# Patient Record
Sex: Female | Born: 1940 | Race: Black or African American | Hispanic: No | Marital: Married | Smoking: Never smoker
Health system: Southern US, Community
[De-identification: ages and names within clinical notes are randomized; demographics above are authoritative.]

## PROBLEM LIST (undated history)

## (undated) ENCOUNTER — Emergency Department (HOSPITAL_COMMUNITY): Admission: EM | Disposition: A | Discharge status: Home / Self Care | Payer: Self-pay

## (undated) ENCOUNTER — Emergency Department (HOSPITAL_COMMUNITY): Discharge status: Absent without leave | Payer: Self-pay

## (undated) DIAGNOSIS — J42 Unspecified chronic bronchitis: Secondary | ICD-10-CM

## (undated) DIAGNOSIS — D509 Iron deficiency anemia, unspecified: Secondary | ICD-10-CM

## (undated) DIAGNOSIS — R079 Chest pain, unspecified: Secondary | ICD-10-CM

## (undated) DIAGNOSIS — F419 Anxiety disorder, unspecified: Secondary | ICD-10-CM

## (undated) DIAGNOSIS — G8929 Other chronic pain: Secondary | ICD-10-CM

## (undated) DIAGNOSIS — I82409 Acute embolism and thrombosis of unspecified deep veins of unspecified lower extremity: Secondary | ICD-10-CM

## (undated) DIAGNOSIS — K219 Gastro-esophageal reflux disease without esophagitis: Secondary | ICD-10-CM

## (undated) DIAGNOSIS — F209 Schizophrenia, unspecified: Secondary | ICD-10-CM

## (undated) DIAGNOSIS — L409 Psoriasis, unspecified: Secondary | ICD-10-CM

## (undated) DIAGNOSIS — R519 Headache, unspecified: Secondary | ICD-10-CM

## (undated) DIAGNOSIS — I8393 Asymptomatic varicose veins of bilateral lower extremities: Secondary | ICD-10-CM

## (undated) DIAGNOSIS — T4145XA Adverse effect of unspecified anesthetic, initial encounter: Secondary | ICD-10-CM

## (undated) DIAGNOSIS — E785 Hyperlipidemia, unspecified: Secondary | ICD-10-CM

## (undated) DIAGNOSIS — R011 Cardiac murmur, unspecified: Secondary | ICD-10-CM

## (undated) DIAGNOSIS — I2699 Other pulmonary embolism without acute cor pulmonale: Secondary | ICD-10-CM

## (undated) DIAGNOSIS — I10 Essential (primary) hypertension: Secondary | ICD-10-CM

## (undated) DIAGNOSIS — F329 Major depressive disorder, single episode, unspecified: Secondary | ICD-10-CM

## (undated) DIAGNOSIS — J189 Pneumonia, unspecified organism: Secondary | ICD-10-CM

## (undated) DIAGNOSIS — G4730 Sleep apnea, unspecified: Secondary | ICD-10-CM

## (undated) DIAGNOSIS — R51 Headache: Secondary | ICD-10-CM

## (undated) DIAGNOSIS — M1990 Unspecified osteoarthritis, unspecified site: Secondary | ICD-10-CM

## (undated) DIAGNOSIS — F32A Depression, unspecified: Secondary | ICD-10-CM

## (undated) DIAGNOSIS — R0602 Shortness of breath: Secondary | ICD-10-CM

## (undated) DIAGNOSIS — T8859XA Other complications of anesthesia, initial encounter: Secondary | ICD-10-CM

## (undated) HISTORY — PX: CARPAL TUNNEL RELEASE: SHX101

## (undated) HISTORY — DX: Acute embolism and thrombosis of unspecified deep veins of unspecified lower extremity: I82.409

## (undated) HISTORY — DX: Other pulmonary embolism without acute cor pulmonale: I26.99

## (undated) HISTORY — DX: Depression, unspecified: F32.A

## (undated) HISTORY — PX: VEIN SURGERY: SHX48

## (undated) HISTORY — DX: Sleep apnea, unspecified: G47.30

## (undated) HISTORY — DX: Anxiety disorder, unspecified: F41.9

## (undated) HISTORY — PX: SPINAL FUSION: SHX223

## (undated) HISTORY — PX: JOINT REPLACEMENT: SHX530

## (undated) HISTORY — DX: Schizophrenia, unspecified: F20.9

## (undated) HISTORY — DX: Hyperlipidemia, unspecified: E78.5

## (undated) HISTORY — DX: Unspecified osteoarthritis, unspecified site: M19.90

## (undated) HISTORY — DX: Gastro-esophageal reflux disease without esophagitis: K21.9

## (undated) HISTORY — DX: Major depressive disorder, single episode, unspecified: F32.9

## (undated) HISTORY — DX: Iron deficiency anemia, unspecified: D50.9

## (undated) HISTORY — DX: Essential (primary) hypertension: I10

## (undated) HISTORY — DX: Other chronic pain: G89.29

---

## 1969-05-25 HISTORY — PX: DILATION AND CURETTAGE OF UTERUS: SHX78

## 1971-09-25 HISTORY — PX: TUBAL LIGATION: SHX77

## 2001-04-20 ENCOUNTER — Emergency Department (HOSPITAL_COMMUNITY): Admission: EM | Admit: 2001-04-20 | Discharge: 2001-04-20 | Payer: Self-pay | Admitting: Emergency Medicine

## 2001-04-28 ENCOUNTER — Encounter: Payer: Self-pay | Admitting: Internal Medicine

## 2001-04-28 ENCOUNTER — Encounter: Admission: RE | Admit: 2001-04-28 | Discharge: 2001-04-28 | Payer: Self-pay | Admitting: Internal Medicine

## 2001-06-14 ENCOUNTER — Emergency Department (HOSPITAL_COMMUNITY): Admission: EM | Admit: 2001-06-14 | Discharge: 2001-06-15 | Payer: Self-pay | Admitting: *Deleted

## 2001-07-15 ENCOUNTER — Other Ambulatory Visit: Admission: RE | Admit: 2001-07-15 | Discharge: 2001-07-15 | Payer: Self-pay | Admitting: Internal Medicine

## 2001-07-17 ENCOUNTER — Emergency Department (HOSPITAL_COMMUNITY): Admission: EM | Admit: 2001-07-17 | Discharge: 2001-07-17 | Payer: Self-pay | Admitting: Emergency Medicine

## 2001-09-19 ENCOUNTER — Encounter: Payer: Self-pay | Admitting: Neurosurgery

## 2001-09-19 ENCOUNTER — Encounter: Admission: RE | Admit: 2001-09-19 | Discharge: 2001-09-19 | Payer: Self-pay | Admitting: Neurosurgery

## 2001-09-20 ENCOUNTER — Emergency Department (HOSPITAL_COMMUNITY): Admission: EM | Admit: 2001-09-20 | Discharge: 2001-09-20 | Payer: Self-pay | Admitting: Emergency Medicine

## 2001-10-06 ENCOUNTER — Encounter: Payer: Self-pay | Admitting: Neurosurgery

## 2001-10-09 ENCOUNTER — Encounter: Payer: Self-pay | Admitting: Neurosurgery

## 2001-10-09 ENCOUNTER — Inpatient Hospital Stay (HOSPITAL_COMMUNITY): Admission: RE | Admit: 2001-10-09 | Discharge: 2001-10-12 | Payer: Self-pay | Admitting: Neurosurgery

## 2001-10-21 ENCOUNTER — Emergency Department (HOSPITAL_COMMUNITY): Admission: EM | Admit: 2001-10-21 | Discharge: 2001-10-21 | Payer: Self-pay | Admitting: *Deleted

## 2001-10-22 ENCOUNTER — Encounter: Payer: Self-pay | Admitting: Emergency Medicine

## 2001-11-18 ENCOUNTER — Encounter: Payer: Self-pay | Admitting: Neurosurgery

## 2001-11-18 ENCOUNTER — Ambulatory Visit (HOSPITAL_COMMUNITY): Admission: RE | Admit: 2001-11-18 | Discharge: 2001-11-18 | Payer: Self-pay | Admitting: Neurosurgery

## 2001-12-25 ENCOUNTER — Emergency Department (HOSPITAL_COMMUNITY): Admission: EM | Admit: 2001-12-25 | Discharge: 2001-12-25 | Payer: Self-pay | Admitting: Emergency Medicine

## 2001-12-25 ENCOUNTER — Encounter: Payer: Self-pay | Admitting: Emergency Medicine

## 2002-02-17 ENCOUNTER — Encounter: Admission: RE | Admit: 2002-02-17 | Discharge: 2002-02-17 | Payer: Self-pay | Admitting: Neurosurgery

## 2002-02-17 ENCOUNTER — Encounter: Payer: Self-pay | Admitting: Neurosurgery

## 2002-03-06 ENCOUNTER — Emergency Department (HOSPITAL_COMMUNITY): Admission: EM | Admit: 2002-03-06 | Discharge: 2002-03-06 | Payer: Self-pay

## 2002-03-24 ENCOUNTER — Encounter: Admission: RE | Admit: 2002-03-24 | Discharge: 2002-03-24 | Payer: Self-pay | Admitting: Neurosurgery

## 2002-03-24 ENCOUNTER — Encounter: Payer: Self-pay | Admitting: Neurosurgery

## 2002-03-25 ENCOUNTER — Encounter: Admission: RE | Admit: 2002-03-25 | Discharge: 2002-06-23 | Payer: Self-pay

## 2002-05-07 ENCOUNTER — Emergency Department (HOSPITAL_COMMUNITY): Admission: EM | Admit: 2002-05-07 | Discharge: 2002-05-07 | Payer: Self-pay | Admitting: Emergency Medicine

## 2002-05-13 ENCOUNTER — Emergency Department (HOSPITAL_COMMUNITY): Admission: EM | Admit: 2002-05-13 | Discharge: 2002-05-13 | Payer: Self-pay | Admitting: Emergency Medicine

## 2002-06-08 ENCOUNTER — Emergency Department (HOSPITAL_COMMUNITY): Admission: EM | Admit: 2002-06-08 | Discharge: 2002-06-08 | Payer: Self-pay | Admitting: Emergency Medicine

## 2002-06-18 ENCOUNTER — Encounter: Admission: RE | Admit: 2002-06-18 | Discharge: 2002-09-16 | Payer: Self-pay

## 2002-06-24 ENCOUNTER — Encounter: Admission: RE | Admit: 2002-06-24 | Discharge: 2002-07-07 | Payer: Self-pay

## 2002-07-22 ENCOUNTER — Encounter: Admission: RE | Admit: 2002-07-22 | Discharge: 2002-10-20 | Payer: Self-pay

## 2002-08-30 ENCOUNTER — Emergency Department (HOSPITAL_COMMUNITY): Admission: EM | Admit: 2002-08-30 | Discharge: 2002-08-30 | Payer: Self-pay | Admitting: Emergency Medicine

## 2002-10-17 ENCOUNTER — Emergency Department (HOSPITAL_COMMUNITY): Admission: EM | Admit: 2002-10-17 | Discharge: 2002-10-17 | Payer: Self-pay | Admitting: *Deleted

## 2002-10-30 ENCOUNTER — Emergency Department (HOSPITAL_COMMUNITY): Admission: EM | Admit: 2002-10-30 | Discharge: 2002-10-30 | Payer: Self-pay | Admitting: Emergency Medicine

## 2002-11-02 ENCOUNTER — Encounter: Admission: RE | Admit: 2002-11-02 | Discharge: 2003-01-31 | Payer: Self-pay

## 2002-11-06 ENCOUNTER — Encounter: Payer: Self-pay | Admitting: Emergency Medicine

## 2002-11-06 ENCOUNTER — Emergency Department (HOSPITAL_COMMUNITY): Admission: EM | Admit: 2002-11-06 | Discharge: 2002-11-06 | Payer: Self-pay | Admitting: Emergency Medicine

## 2002-11-21 ENCOUNTER — Emergency Department (HOSPITAL_COMMUNITY): Admission: EM | Admit: 2002-11-21 | Discharge: 2002-11-21 | Payer: Self-pay | Admitting: Emergency Medicine

## 2002-11-24 ENCOUNTER — Emergency Department (HOSPITAL_COMMUNITY): Admission: EM | Admit: 2002-11-24 | Discharge: 2002-11-24 | Payer: Self-pay | Admitting: Emergency Medicine

## 2002-12-17 ENCOUNTER — Emergency Department (HOSPITAL_COMMUNITY): Admission: EM | Admit: 2002-12-17 | Discharge: 2002-12-17 | Payer: Self-pay | Admitting: Emergency Medicine

## 2002-12-24 ENCOUNTER — Ambulatory Visit (HOSPITAL_BASED_OUTPATIENT_CLINIC_OR_DEPARTMENT_OTHER): Admission: RE | Admit: 2002-12-24 | Discharge: 2002-12-24 | Payer: Self-pay | Admitting: Neurology

## 2003-01-13 ENCOUNTER — Emergency Department (HOSPITAL_COMMUNITY): Admission: EM | Admit: 2003-01-13 | Discharge: 2003-01-13 | Payer: Self-pay | Admitting: Emergency Medicine

## 2003-02-03 ENCOUNTER — Emergency Department (HOSPITAL_COMMUNITY): Admission: EM | Admit: 2003-02-03 | Discharge: 2003-02-03 | Payer: Self-pay | Admitting: Emergency Medicine

## 2003-02-15 ENCOUNTER — Emergency Department (HOSPITAL_COMMUNITY): Admission: EM | Admit: 2003-02-15 | Discharge: 2003-02-16 | Payer: Self-pay | Admitting: Emergency Medicine

## 2003-03-04 ENCOUNTER — Encounter: Admission: RE | Admit: 2003-03-04 | Discharge: 2003-03-04 | Payer: Self-pay | Admitting: Obstetrics and Gynecology

## 2003-03-10 ENCOUNTER — Ambulatory Visit (HOSPITAL_COMMUNITY): Admission: RE | Admit: 2003-03-10 | Discharge: 2003-03-10 | Payer: Self-pay | Admitting: *Deleted

## 2003-03-14 ENCOUNTER — Emergency Department (HOSPITAL_COMMUNITY): Admission: EM | Admit: 2003-03-14 | Discharge: 2003-03-14 | Payer: Self-pay | Admitting: Emergency Medicine

## 2003-03-24 ENCOUNTER — Encounter: Payer: Self-pay | Admitting: Orthopedic Surgery

## 2003-03-27 ENCOUNTER — Emergency Department (HOSPITAL_COMMUNITY): Admission: EM | Admit: 2003-03-27 | Discharge: 2003-03-27 | Payer: Self-pay | Admitting: Emergency Medicine

## 2003-03-30 ENCOUNTER — Ambulatory Visit (HOSPITAL_COMMUNITY): Admission: RE | Admit: 2003-03-30 | Discharge: 2003-03-30 | Payer: Self-pay | Admitting: Orthopedic Surgery

## 2003-04-24 ENCOUNTER — Emergency Department (HOSPITAL_COMMUNITY): Admission: EM | Admit: 2003-04-24 | Discharge: 2003-04-24 | Payer: Self-pay | Admitting: Emergency Medicine

## 2003-05-22 ENCOUNTER — Emergency Department (HOSPITAL_COMMUNITY): Admission: EM | Admit: 2003-05-22 | Discharge: 2003-05-22 | Payer: Self-pay | Admitting: Emergency Medicine

## 2003-06-08 ENCOUNTER — Emergency Department (HOSPITAL_COMMUNITY): Admission: EM | Admit: 2003-06-08 | Discharge: 2003-06-09 | Payer: Self-pay | Admitting: Emergency Medicine

## 2003-07-14 ENCOUNTER — Emergency Department (HOSPITAL_COMMUNITY): Admission: AD | Admit: 2003-07-14 | Discharge: 2003-07-14 | Payer: Self-pay | Admitting: Family Medicine

## 2003-07-17 ENCOUNTER — Emergency Department (HOSPITAL_COMMUNITY): Admission: EM | Admit: 2003-07-17 | Discharge: 2003-07-17 | Payer: Self-pay | Admitting: Emergency Medicine

## 2003-08-04 ENCOUNTER — Emergency Department (HOSPITAL_COMMUNITY): Admission: AD | Admit: 2003-08-04 | Discharge: 2003-08-04 | Payer: Self-pay | Admitting: Family Medicine

## 2003-08-05 ENCOUNTER — Encounter: Admission: RE | Admit: 2003-08-05 | Discharge: 2003-08-05 | Payer: Self-pay | Admitting: Obstetrics and Gynecology

## 2003-09-09 ENCOUNTER — Encounter: Admission: RE | Admit: 2003-09-09 | Discharge: 2003-09-09 | Payer: Self-pay | Admitting: Obstetrics and Gynecology

## 2003-09-11 ENCOUNTER — Emergency Department (HOSPITAL_COMMUNITY): Admission: EM | Admit: 2003-09-11 | Discharge: 2003-09-11 | Payer: Self-pay

## 2003-09-15 ENCOUNTER — Emergency Department (HOSPITAL_COMMUNITY): Admission: EM | Admit: 2003-09-15 | Discharge: 2003-09-15 | Payer: Self-pay

## 2003-09-25 ENCOUNTER — Emergency Department (HOSPITAL_COMMUNITY): Admission: EM | Admit: 2003-09-25 | Discharge: 2003-09-26 | Payer: Self-pay | Admitting: Emergency Medicine

## 2003-10-08 ENCOUNTER — Emergency Department (HOSPITAL_COMMUNITY): Admission: AD | Admit: 2003-10-08 | Discharge: 2003-10-08 | Payer: Self-pay | Admitting: Family Medicine

## 2003-10-14 ENCOUNTER — Emergency Department (HOSPITAL_COMMUNITY): Admission: AD | Admit: 2003-10-14 | Discharge: 2003-10-14 | Payer: Self-pay | Admitting: Family Medicine

## 2003-10-21 ENCOUNTER — Encounter: Admission: RE | Admit: 2003-10-21 | Discharge: 2003-10-21 | Payer: Self-pay | Admitting: Internal Medicine

## 2003-10-23 ENCOUNTER — Emergency Department (HOSPITAL_COMMUNITY): Admission: AD | Admit: 2003-10-23 | Discharge: 2003-10-23 | Payer: Self-pay | Admitting: Family Medicine

## 2003-11-03 ENCOUNTER — Emergency Department (HOSPITAL_COMMUNITY): Admission: EM | Admit: 2003-11-03 | Discharge: 2003-11-03 | Payer: Self-pay | Admitting: Family Medicine

## 2003-11-14 ENCOUNTER — Emergency Department (HOSPITAL_COMMUNITY): Admission: EM | Admit: 2003-11-14 | Discharge: 2003-11-14 | Payer: Self-pay | Admitting: Emergency Medicine

## 2003-11-15 ENCOUNTER — Emergency Department (HOSPITAL_COMMUNITY): Admission: EM | Admit: 2003-11-15 | Discharge: 2003-11-15 | Payer: Self-pay | Admitting: Family Medicine

## 2003-12-03 ENCOUNTER — Emergency Department (HOSPITAL_COMMUNITY): Admission: EM | Admit: 2003-12-03 | Discharge: 2003-12-03 | Payer: Self-pay | Admitting: Family Medicine

## 2003-12-19 ENCOUNTER — Emergency Department (HOSPITAL_COMMUNITY): Admission: AD | Admit: 2003-12-19 | Discharge: 2003-12-19 | Payer: Self-pay | Admitting: Internal Medicine

## 2003-12-24 ENCOUNTER — Emergency Department (HOSPITAL_COMMUNITY): Admission: EM | Admit: 2003-12-24 | Discharge: 2003-12-25 | Payer: Self-pay | Admitting: Emergency Medicine

## 2004-01-06 ENCOUNTER — Emergency Department (HOSPITAL_COMMUNITY): Admission: EM | Admit: 2004-01-06 | Discharge: 2004-01-06 | Payer: Self-pay | Admitting: *Deleted

## 2004-01-27 ENCOUNTER — Emergency Department (HOSPITAL_COMMUNITY): Admission: EM | Admit: 2004-01-27 | Discharge: 2004-01-27 | Payer: Self-pay | Admitting: Family Medicine

## 2004-02-23 ENCOUNTER — Emergency Department (HOSPITAL_COMMUNITY): Admission: EM | Admit: 2004-02-23 | Discharge: 2004-02-23 | Payer: Self-pay | Admitting: Family Medicine

## 2004-02-24 ENCOUNTER — Emergency Department (HOSPITAL_COMMUNITY): Admission: EM | Admit: 2004-02-24 | Discharge: 2004-02-24 | Payer: Self-pay | Admitting: Emergency Medicine

## 2004-04-25 ENCOUNTER — Emergency Department (HOSPITAL_COMMUNITY): Admission: EM | Admit: 2004-04-25 | Discharge: 2004-04-26 | Payer: Self-pay | Admitting: Emergency Medicine

## 2004-05-05 ENCOUNTER — Emergency Department (HOSPITAL_COMMUNITY): Admission: EM | Admit: 2004-05-05 | Discharge: 2004-05-06 | Payer: Self-pay | Admitting: Emergency Medicine

## 2004-05-21 ENCOUNTER — Emergency Department (HOSPITAL_COMMUNITY): Admission: EM | Admit: 2004-05-21 | Discharge: 2004-05-21 | Payer: Self-pay | Admitting: Emergency Medicine

## 2004-05-27 ENCOUNTER — Emergency Department (HOSPITAL_COMMUNITY): Admission: EM | Admit: 2004-05-27 | Discharge: 2004-05-27 | Payer: Self-pay | Admitting: Emergency Medicine

## 2004-07-04 ENCOUNTER — Emergency Department (HOSPITAL_COMMUNITY): Admission: EM | Admit: 2004-07-04 | Discharge: 2004-07-04 | Payer: Self-pay | Admitting: Emergency Medicine

## 2004-08-26 ENCOUNTER — Emergency Department (HOSPITAL_COMMUNITY): Admission: EM | Admit: 2004-08-26 | Discharge: 2004-08-26 | Payer: Self-pay | Admitting: Emergency Medicine

## 2004-09-18 ENCOUNTER — Emergency Department (HOSPITAL_COMMUNITY): Admission: EM | Admit: 2004-09-18 | Discharge: 2004-09-18 | Payer: Self-pay | Admitting: Emergency Medicine

## 2004-09-25 ENCOUNTER — Emergency Department (HOSPITAL_COMMUNITY): Admission: EM | Admit: 2004-09-25 | Discharge: 2004-09-25 | Payer: Self-pay | Admitting: Family Medicine

## 2004-11-08 ENCOUNTER — Emergency Department (HOSPITAL_COMMUNITY): Admission: EM | Admit: 2004-11-08 | Discharge: 2004-11-08 | Payer: Self-pay | Admitting: Family Medicine

## 2004-11-16 ENCOUNTER — Ambulatory Visit (HOSPITAL_COMMUNITY): Admission: RE | Admit: 2004-11-16 | Discharge: 2004-11-16 | Payer: Self-pay | Admitting: Obstetrics and Gynecology

## 2004-11-25 ENCOUNTER — Emergency Department (HOSPITAL_COMMUNITY): Admission: EM | Admit: 2004-11-25 | Discharge: 2004-11-25 | Payer: Self-pay | Admitting: Internal Medicine

## 2004-11-26 ENCOUNTER — Emergency Department (HOSPITAL_COMMUNITY): Admission: EM | Admit: 2004-11-26 | Discharge: 2004-11-26 | Payer: Self-pay | Admitting: Family Medicine

## 2004-12-13 ENCOUNTER — Emergency Department (HOSPITAL_COMMUNITY): Admission: EM | Admit: 2004-12-13 | Discharge: 2004-12-13 | Payer: Self-pay | Admitting: Family Medicine

## 2005-01-09 ENCOUNTER — Emergency Department (HOSPITAL_COMMUNITY): Admission: EM | Admit: 2005-01-09 | Discharge: 2005-01-09 | Payer: Self-pay | Admitting: *Deleted

## 2005-01-27 ENCOUNTER — Emergency Department (HOSPITAL_COMMUNITY): Admission: EM | Admit: 2005-01-27 | Discharge: 2005-01-27 | Payer: Self-pay | Admitting: Emergency Medicine

## 2005-02-04 ENCOUNTER — Emergency Department (HOSPITAL_COMMUNITY): Admission: EM | Admit: 2005-02-04 | Discharge: 2005-02-04 | Payer: Self-pay | Admitting: Family Medicine

## 2005-03-07 ENCOUNTER — Emergency Department (HOSPITAL_COMMUNITY): Admission: EM | Admit: 2005-03-07 | Discharge: 2005-03-07 | Payer: Self-pay | Admitting: Family Medicine

## 2005-03-09 ENCOUNTER — Ambulatory Visit (HOSPITAL_COMMUNITY): Admission: RE | Admit: 2005-03-09 | Discharge: 2005-03-09 | Payer: Self-pay | Admitting: Orthopedic Surgery

## 2005-03-27 ENCOUNTER — Emergency Department (HOSPITAL_COMMUNITY): Admission: EM | Admit: 2005-03-27 | Discharge: 2005-03-27 | Payer: Self-pay | Admitting: Emergency Medicine

## 2005-04-22 ENCOUNTER — Emergency Department (HOSPITAL_COMMUNITY): Admission: EM | Admit: 2005-04-22 | Discharge: 2005-04-22 | Payer: Self-pay | Admitting: Family Medicine

## 2005-04-23 ENCOUNTER — Emergency Department (HOSPITAL_COMMUNITY): Admission: EM | Admit: 2005-04-23 | Discharge: 2005-04-23 | Payer: Self-pay | Admitting: *Deleted

## 2005-04-26 ENCOUNTER — Inpatient Hospital Stay (HOSPITAL_COMMUNITY): Admission: RE | Admit: 2005-04-26 | Discharge: 2005-04-28 | Payer: Self-pay | Admitting: Orthopedic Surgery

## 2005-05-09 ENCOUNTER — Emergency Department (HOSPITAL_COMMUNITY): Admission: EM | Admit: 2005-05-09 | Discharge: 2005-05-10 | Payer: Self-pay | Admitting: Emergency Medicine

## 2005-05-18 ENCOUNTER — Encounter: Admission: RE | Admit: 2005-05-18 | Discharge: 2005-06-14 | Payer: Self-pay | Admitting: Orthopedic Surgery

## 2005-05-18 ENCOUNTER — Emergency Department (HOSPITAL_COMMUNITY): Admission: EM | Admit: 2005-05-18 | Discharge: 2005-05-18 | Payer: Self-pay | Admitting: Emergency Medicine

## 2005-05-31 ENCOUNTER — Emergency Department (HOSPITAL_COMMUNITY): Admission: EM | Admit: 2005-05-31 | Discharge: 2005-05-31 | Payer: Self-pay | Admitting: Emergency Medicine

## 2005-06-11 ENCOUNTER — Emergency Department (HOSPITAL_COMMUNITY): Admission: EM | Admit: 2005-06-11 | Discharge: 2005-06-11 | Payer: Self-pay | Admitting: Emergency Medicine

## 2005-06-27 ENCOUNTER — Emergency Department (HOSPITAL_COMMUNITY): Admission: EM | Admit: 2005-06-27 | Discharge: 2005-06-27 | Payer: Self-pay | Admitting: Emergency Medicine

## 2005-07-09 ENCOUNTER — Emergency Department (HOSPITAL_COMMUNITY): Admission: EM | Admit: 2005-07-09 | Discharge: 2005-07-09 | Payer: Self-pay | Admitting: Emergency Medicine

## 2005-07-28 ENCOUNTER — Emergency Department (HOSPITAL_COMMUNITY): Admission: EM | Admit: 2005-07-28 | Discharge: 2005-07-28 | Payer: Self-pay | Admitting: Emergency Medicine

## 2005-08-21 ENCOUNTER — Emergency Department (HOSPITAL_COMMUNITY): Admission: EM | Admit: 2005-08-21 | Discharge: 2005-08-21 | Payer: Self-pay | Admitting: Emergency Medicine

## 2005-08-22 ENCOUNTER — Emergency Department (HOSPITAL_COMMUNITY): Admission: EM | Admit: 2005-08-22 | Discharge: 2005-08-22 | Payer: Self-pay | Admitting: Emergency Medicine

## 2005-09-16 ENCOUNTER — Emergency Department (HOSPITAL_COMMUNITY): Admission: EM | Admit: 2005-09-16 | Discharge: 2005-09-16 | Payer: Self-pay | Admitting: Emergency Medicine

## 2005-09-24 ENCOUNTER — Emergency Department (HOSPITAL_COMMUNITY): Admission: EM | Admit: 2005-09-24 | Discharge: 2005-09-24 | Payer: Self-pay | Admitting: Family Medicine

## 2006-01-23 ENCOUNTER — Encounter: Payer: Self-pay | Admitting: Neurosurgery

## 2006-02-25 ENCOUNTER — Emergency Department (HOSPITAL_COMMUNITY): Admission: EM | Admit: 2006-02-25 | Discharge: 2006-02-25 | Payer: Self-pay | Admitting: Emergency Medicine

## 2006-03-01 ENCOUNTER — Emergency Department (HOSPITAL_COMMUNITY): Admission: EM | Admit: 2006-03-01 | Discharge: 2006-03-02 | Payer: Self-pay | Admitting: Emergency Medicine

## 2006-03-06 ENCOUNTER — Encounter: Admission: RE | Admit: 2006-03-06 | Discharge: 2006-03-06 | Payer: Self-pay | Admitting: Internal Medicine

## 2006-03-26 ENCOUNTER — Emergency Department (HOSPITAL_COMMUNITY): Admission: EM | Admit: 2006-03-26 | Discharge: 2006-03-27 | Payer: Self-pay | Admitting: Emergency Medicine

## 2006-04-04 ENCOUNTER — Encounter: Admission: RE | Admit: 2006-04-04 | Discharge: 2006-04-04 | Payer: Self-pay | Admitting: Internal Medicine

## 2006-04-11 ENCOUNTER — Encounter: Admission: RE | Admit: 2006-04-11 | Discharge: 2006-04-11 | Payer: Self-pay | Admitting: Internal Medicine

## 2006-04-28 ENCOUNTER — Emergency Department (HOSPITAL_COMMUNITY): Admission: EM | Admit: 2006-04-28 | Discharge: 2006-04-28 | Payer: Self-pay | Admitting: Family Medicine

## 2006-05-07 ENCOUNTER — Emergency Department (HOSPITAL_COMMUNITY): Admission: EM | Admit: 2006-05-07 | Discharge: 2006-05-07 | Payer: Self-pay | Admitting: *Deleted

## 2006-05-16 ENCOUNTER — Encounter: Admission: RE | Admit: 2006-05-16 | Discharge: 2006-05-16 | Payer: Self-pay | Admitting: Internal Medicine

## 2006-05-22 ENCOUNTER — Emergency Department (HOSPITAL_COMMUNITY): Admission: EM | Admit: 2006-05-22 | Discharge: 2006-05-22 | Payer: Self-pay | Admitting: Family Medicine

## 2006-06-04 ENCOUNTER — Encounter: Admission: RE | Admit: 2006-06-04 | Discharge: 2006-06-04 | Payer: Self-pay | Admitting: Interventional Radiology

## 2006-06-11 ENCOUNTER — Encounter: Admission: RE | Admit: 2006-06-11 | Discharge: 2006-06-11 | Payer: Self-pay | Admitting: Family Medicine

## 2006-06-29 ENCOUNTER — Encounter: Admission: RE | Admit: 2006-06-29 | Discharge: 2006-06-29 | Payer: Self-pay | Admitting: Neurosurgery

## 2006-07-04 ENCOUNTER — Encounter: Admission: RE | Admit: 2006-07-04 | Discharge: 2006-07-04 | Payer: Self-pay | Admitting: Interventional Radiology

## 2006-07-04 ENCOUNTER — Emergency Department (HOSPITAL_COMMUNITY): Admission: EM | Admit: 2006-07-04 | Discharge: 2006-07-04 | Payer: Self-pay | Admitting: Family Medicine

## 2006-08-07 ENCOUNTER — Emergency Department (HOSPITAL_COMMUNITY): Admission: EM | Admit: 2006-08-07 | Discharge: 2006-08-07 | Payer: Self-pay | Admitting: Emergency Medicine

## 2006-08-24 ENCOUNTER — Emergency Department (HOSPITAL_COMMUNITY): Admission: EM | Admit: 2006-08-24 | Discharge: 2006-08-24 | Payer: Self-pay | Admitting: Family Medicine

## 2006-08-30 ENCOUNTER — Emergency Department (HOSPITAL_COMMUNITY): Admission: EM | Admit: 2006-08-30 | Discharge: 2006-08-30 | Payer: Self-pay | Admitting: Emergency Medicine

## 2006-09-04 ENCOUNTER — Emergency Department (HOSPITAL_COMMUNITY): Admission: EM | Admit: 2006-09-04 | Discharge: 2006-09-04 | Payer: Self-pay | Admitting: Emergency Medicine

## 2006-09-12 ENCOUNTER — Emergency Department (HOSPITAL_COMMUNITY): Admission: EM | Admit: 2006-09-12 | Discharge: 2006-09-12 | Payer: Self-pay | Admitting: Emergency Medicine

## 2006-09-21 ENCOUNTER — Emergency Department (HOSPITAL_COMMUNITY): Admission: EM | Admit: 2006-09-21 | Discharge: 2006-09-21 | Payer: Self-pay | Admitting: Emergency Medicine

## 2006-10-15 ENCOUNTER — Emergency Department (HOSPITAL_COMMUNITY): Admission: EM | Admit: 2006-10-15 | Discharge: 2006-10-15 | Payer: Self-pay | Admitting: Emergency Medicine

## 2006-10-24 ENCOUNTER — Encounter: Admission: RE | Admit: 2006-10-24 | Discharge: 2006-10-24 | Payer: Self-pay | Admitting: Interventional Radiology

## 2006-10-31 ENCOUNTER — Ambulatory Visit (HOSPITAL_COMMUNITY): Admission: RE | Admit: 2006-10-31 | Discharge: 2006-10-31 | Payer: Self-pay | Admitting: Obstetrics and Gynecology

## 2006-12-04 ENCOUNTER — Encounter: Admission: RE | Admit: 2006-12-04 | Discharge: 2006-12-04 | Payer: Self-pay | Admitting: Interventional Radiology

## 2007-01-08 ENCOUNTER — Emergency Department (HOSPITAL_COMMUNITY): Admission: EM | Admit: 2007-01-08 | Discharge: 2007-01-08 | Payer: Self-pay | Admitting: Family Medicine

## 2007-01-16 ENCOUNTER — Emergency Department (HOSPITAL_COMMUNITY): Admission: EM | Admit: 2007-01-16 | Discharge: 2007-01-16 | Payer: Self-pay | Admitting: Emergency Medicine

## 2007-01-24 ENCOUNTER — Emergency Department (HOSPITAL_COMMUNITY): Admission: EM | Admit: 2007-01-24 | Discharge: 2007-01-24 | Payer: Self-pay | Admitting: Emergency Medicine

## 2007-02-27 ENCOUNTER — Encounter
Admission: RE | Admit: 2007-02-27 | Discharge: 2007-02-27 | Payer: Self-pay | Admitting: Physical Medicine and Rehabilitation

## 2007-03-01 ENCOUNTER — Emergency Department (HOSPITAL_COMMUNITY): Admission: EM | Admit: 2007-03-01 | Discharge: 2007-03-01 | Payer: Self-pay | Admitting: Emergency Medicine

## 2007-03-13 ENCOUNTER — Emergency Department (HOSPITAL_COMMUNITY): Admission: EM | Admit: 2007-03-13 | Discharge: 2007-03-13 | Payer: Self-pay | Admitting: Emergency Medicine

## 2007-03-27 ENCOUNTER — Emergency Department (HOSPITAL_COMMUNITY): Admission: EM | Admit: 2007-03-27 | Discharge: 2007-03-27 | Payer: Self-pay | Admitting: Emergency Medicine

## 2007-04-06 ENCOUNTER — Emergency Department (HOSPITAL_COMMUNITY): Admission: EM | Admit: 2007-04-06 | Discharge: 2007-04-06 | Payer: Self-pay | Admitting: Emergency Medicine

## 2007-04-11 ENCOUNTER — Emergency Department (HOSPITAL_COMMUNITY): Admission: EM | Admit: 2007-04-11 | Discharge: 2007-04-12 | Payer: Self-pay | Admitting: Emergency Medicine

## 2007-04-12 ENCOUNTER — Encounter (INDEPENDENT_AMBULATORY_CARE_PROVIDER_SITE_OTHER): Payer: Self-pay | Admitting: Emergency Medicine

## 2007-04-12 ENCOUNTER — Ambulatory Visit (HOSPITAL_COMMUNITY): Admission: RE | Admit: 2007-04-12 | Discharge: 2007-04-12 | Payer: Self-pay | Admitting: Emergency Medicine

## 2007-04-12 ENCOUNTER — Ambulatory Visit: Payer: Self-pay | Admitting: Vascular Surgery

## 2007-04-13 ENCOUNTER — Encounter
Admission: RE | Admit: 2007-04-13 | Discharge: 2007-04-13 | Payer: Self-pay | Admitting: Physical Medicine and Rehabilitation

## 2007-04-13 ENCOUNTER — Emergency Department (HOSPITAL_COMMUNITY): Admission: EM | Admit: 2007-04-13 | Discharge: 2007-04-14 | Payer: Self-pay | Admitting: Emergency Medicine

## 2007-04-15 ENCOUNTER — Emergency Department (HOSPITAL_COMMUNITY): Admission: EM | Admit: 2007-04-15 | Discharge: 2007-04-15 | Payer: Self-pay | Admitting: Emergency Medicine

## 2007-04-17 ENCOUNTER — Encounter: Admission: RE | Admit: 2007-04-17 | Discharge: 2007-04-17 | Payer: Self-pay | Admitting: Interventional Radiology

## 2007-04-22 ENCOUNTER — Emergency Department (HOSPITAL_COMMUNITY): Admission: EM | Admit: 2007-04-22 | Discharge: 2007-04-22 | Payer: Self-pay | Admitting: Family Medicine

## 2007-05-25 ENCOUNTER — Emergency Department (HOSPITAL_COMMUNITY): Admission: EM | Admit: 2007-05-25 | Discharge: 2007-05-25 | Payer: Self-pay | Admitting: Family Medicine

## 2007-06-07 ENCOUNTER — Emergency Department (HOSPITAL_COMMUNITY): Admission: EM | Admit: 2007-06-07 | Discharge: 2007-06-07 | Payer: Self-pay | Admitting: Emergency Medicine

## 2007-06-13 ENCOUNTER — Ambulatory Visit (HOSPITAL_COMMUNITY): Admission: RE | Admit: 2007-06-13 | Discharge: 2007-06-13 | Payer: Self-pay | Admitting: Family Medicine

## 2007-07-08 ENCOUNTER — Emergency Department (HOSPITAL_COMMUNITY): Admission: EM | Admit: 2007-07-08 | Discharge: 2007-07-08 | Payer: Self-pay | Admitting: Emergency Medicine

## 2007-07-20 ENCOUNTER — Emergency Department (HOSPITAL_COMMUNITY): Admission: EM | Admit: 2007-07-20 | Discharge: 2007-07-20 | Payer: Self-pay | Admitting: Emergency Medicine

## 2007-09-26 ENCOUNTER — Emergency Department (HOSPITAL_COMMUNITY): Admission: EM | Admit: 2007-09-26 | Discharge: 2007-09-27 | Payer: Self-pay | Admitting: Emergency Medicine

## 2007-09-29 ENCOUNTER — Emergency Department (HOSPITAL_COMMUNITY): Admission: EM | Admit: 2007-09-29 | Discharge: 2007-09-30 | Payer: Self-pay | Admitting: Emergency Medicine

## 2007-11-02 ENCOUNTER — Emergency Department (HOSPITAL_COMMUNITY): Admission: EM | Admit: 2007-11-02 | Discharge: 2007-11-02 | Payer: Self-pay | Admitting: Family Medicine

## 2007-11-14 ENCOUNTER — Emergency Department (HOSPITAL_COMMUNITY): Admission: EM | Admit: 2007-11-14 | Discharge: 2007-11-14 | Payer: Self-pay | Admitting: Emergency Medicine

## 2007-11-28 ENCOUNTER — Emergency Department (HOSPITAL_COMMUNITY): Admission: EM | Admit: 2007-11-28 | Discharge: 2007-11-28 | Payer: Self-pay | Admitting: Family Medicine

## 2007-12-21 ENCOUNTER — Emergency Department (HOSPITAL_COMMUNITY): Admission: EM | Admit: 2007-12-21 | Discharge: 2007-12-21 | Payer: Self-pay | Admitting: Family Medicine

## 2007-12-27 ENCOUNTER — Emergency Department (HOSPITAL_COMMUNITY): Admission: EM | Admit: 2007-12-27 | Discharge: 2007-12-27 | Payer: Self-pay | Admitting: Emergency Medicine

## 2008-01-07 ENCOUNTER — Emergency Department (HOSPITAL_COMMUNITY): Admission: EM | Admit: 2008-01-07 | Discharge: 2008-01-07 | Payer: Self-pay | Admitting: Emergency Medicine

## 2008-01-15 ENCOUNTER — Emergency Department (HOSPITAL_COMMUNITY): Admission: EM | Admit: 2008-01-15 | Discharge: 2008-01-15 | Payer: Self-pay | Admitting: Emergency Medicine

## 2008-01-17 ENCOUNTER — Emergency Department (HOSPITAL_COMMUNITY): Admission: EM | Admit: 2008-01-17 | Discharge: 2008-01-17 | Payer: Self-pay | Admitting: Emergency Medicine

## 2008-02-24 ENCOUNTER — Emergency Department (HOSPITAL_COMMUNITY): Admission: EM | Admit: 2008-02-24 | Discharge: 2008-02-24 | Payer: Self-pay | Admitting: Family Medicine

## 2008-03-12 ENCOUNTER — Emergency Department (HOSPITAL_COMMUNITY): Admission: EM | Admit: 2008-03-12 | Discharge: 2008-03-12 | Payer: Self-pay | Admitting: Emergency Medicine

## 2008-03-17 ENCOUNTER — Emergency Department (HOSPITAL_COMMUNITY): Admission: EM | Admit: 2008-03-17 | Discharge: 2008-03-17 | Payer: Self-pay | Admitting: Emergency Medicine

## 2008-03-30 ENCOUNTER — Encounter (INDEPENDENT_AMBULATORY_CARE_PROVIDER_SITE_OTHER): Payer: Self-pay | Admitting: Emergency Medicine

## 2008-03-30 ENCOUNTER — Ambulatory Visit: Payer: Self-pay | Admitting: Vascular Surgery

## 2008-03-30 ENCOUNTER — Emergency Department (HOSPITAL_COMMUNITY): Admission: EM | Admit: 2008-03-30 | Discharge: 2008-03-30 | Payer: Self-pay | Admitting: Emergency Medicine

## 2008-04-12 ENCOUNTER — Encounter: Admission: RE | Admit: 2008-04-12 | Discharge: 2008-07-11 | Payer: Self-pay | Admitting: Orthopedic Surgery

## 2008-04-26 ENCOUNTER — Emergency Department (HOSPITAL_COMMUNITY): Admission: EM | Admit: 2008-04-26 | Discharge: 2008-04-26 | Payer: Self-pay | Admitting: Emergency Medicine

## 2008-05-19 ENCOUNTER — Encounter: Admission: RE | Admit: 2008-05-19 | Discharge: 2008-05-19 | Payer: Self-pay | Admitting: Interventional Radiology

## 2008-05-27 ENCOUNTER — Emergency Department (HOSPITAL_COMMUNITY): Admission: EM | Admit: 2008-05-27 | Discharge: 2008-05-28 | Payer: Self-pay | Admitting: *Deleted

## 2008-09-13 ENCOUNTER — Emergency Department (HOSPITAL_COMMUNITY): Admission: EM | Admit: 2008-09-13 | Discharge: 2008-09-13 | Payer: Self-pay | Admitting: Family Medicine

## 2008-09-13 ENCOUNTER — Emergency Department (HOSPITAL_COMMUNITY): Admission: EM | Admit: 2008-09-13 | Discharge: 2008-09-14 | Payer: Self-pay | Admitting: Emergency Medicine

## 2008-10-26 ENCOUNTER — Emergency Department (HOSPITAL_COMMUNITY): Admission: EM | Admit: 2008-10-26 | Discharge: 2008-10-26 | Payer: Self-pay | Admitting: Emergency Medicine

## 2008-10-29 ENCOUNTER — Emergency Department (HOSPITAL_COMMUNITY): Admission: EM | Admit: 2008-10-29 | Discharge: 2008-10-30 | Payer: Self-pay | Admitting: Emergency Medicine

## 2008-11-07 ENCOUNTER — Emergency Department (HOSPITAL_COMMUNITY): Admission: EM | Admit: 2008-11-07 | Discharge: 2008-11-07 | Payer: Self-pay | Admitting: Emergency Medicine

## 2008-11-09 ENCOUNTER — Emergency Department (HOSPITAL_COMMUNITY): Admission: EM | Admit: 2008-11-09 | Discharge: 2008-11-09 | Payer: Self-pay | Admitting: Emergency Medicine

## 2008-11-15 ENCOUNTER — Emergency Department (HOSPITAL_COMMUNITY): Admission: EM | Admit: 2008-11-15 | Discharge: 2008-11-15 | Payer: Self-pay | Admitting: Emergency Medicine

## 2008-11-22 HISTORY — PX: TOTAL KNEE ARTHROPLASTY: SHX125

## 2008-11-23 ENCOUNTER — Emergency Department (HOSPITAL_COMMUNITY): Admission: EM | Admit: 2008-11-23 | Discharge: 2008-11-23 | Payer: Self-pay | Admitting: Emergency Medicine

## 2008-11-24 ENCOUNTER — Emergency Department (HOSPITAL_COMMUNITY): Admission: EM | Admit: 2008-11-24 | Discharge: 2008-11-24 | Payer: Self-pay | Admitting: Emergency Medicine

## 2008-12-11 ENCOUNTER — Emergency Department (HOSPITAL_COMMUNITY): Admission: EM | Admit: 2008-12-11 | Discharge: 2008-12-11 | Payer: Self-pay | Admitting: Emergency Medicine

## 2008-12-15 ENCOUNTER — Encounter: Admission: RE | Admit: 2008-12-15 | Discharge: 2008-12-15 | Payer: Self-pay | Admitting: Internal Medicine

## 2008-12-28 ENCOUNTER — Emergency Department (HOSPITAL_COMMUNITY): Admission: EM | Admit: 2008-12-28 | Discharge: 2008-12-28 | Payer: Self-pay | Admitting: Emergency Medicine

## 2009-01-17 ENCOUNTER — Emergency Department (HOSPITAL_COMMUNITY): Admission: EM | Admit: 2009-01-17 | Discharge: 2009-01-17 | Payer: Self-pay

## 2009-02-08 ENCOUNTER — Encounter: Admission: RE | Admit: 2009-02-08 | Discharge: 2009-02-08 | Payer: Self-pay | Admitting: Interventional Radiology

## 2009-02-15 ENCOUNTER — Emergency Department (HOSPITAL_COMMUNITY): Admission: EM | Admit: 2009-02-15 | Discharge: 2009-02-15 | Payer: Self-pay | Admitting: Emergency Medicine

## 2009-03-03 ENCOUNTER — Emergency Department (HOSPITAL_COMMUNITY): Admission: EM | Admit: 2009-03-03 | Discharge: 2009-03-04 | Payer: Self-pay | Admitting: Emergency Medicine

## 2009-03-03 ENCOUNTER — Emergency Department (HOSPITAL_COMMUNITY): Admission: EM | Admit: 2009-03-03 | Discharge: 2009-03-03 | Payer: Self-pay | Admitting: Emergency Medicine

## 2009-03-11 ENCOUNTER — Emergency Department (HOSPITAL_COMMUNITY): Admission: EM | Admit: 2009-03-11 | Discharge: 2009-03-11 | Payer: Self-pay | Admitting: Emergency Medicine

## 2009-03-29 ENCOUNTER — Encounter: Admission: RE | Admit: 2009-03-29 | Discharge: 2009-03-29 | Payer: Self-pay | Admitting: Interventional Radiology

## 2009-03-30 ENCOUNTER — Emergency Department (HOSPITAL_COMMUNITY): Admission: EM | Admit: 2009-03-30 | Discharge: 2009-03-30 | Payer: Self-pay | Admitting: Emergency Medicine

## 2009-04-09 ENCOUNTER — Emergency Department (HOSPITAL_COMMUNITY): Admission: EM | Admit: 2009-04-09 | Discharge: 2009-04-09 | Payer: Self-pay | Admitting: Emergency Medicine

## 2009-04-17 ENCOUNTER — Emergency Department (HOSPITAL_COMMUNITY): Admission: EM | Admit: 2009-04-17 | Discharge: 2009-04-17 | Payer: Self-pay | Admitting: Emergency Medicine

## 2009-04-23 ENCOUNTER — Emergency Department (HOSPITAL_COMMUNITY): Admission: EM | Admit: 2009-04-23 | Discharge: 2009-04-23 | Payer: Self-pay | Admitting: Emergency Medicine

## 2009-04-25 ENCOUNTER — Emergency Department (HOSPITAL_COMMUNITY): Admission: EM | Admit: 2009-04-25 | Discharge: 2009-04-25 | Payer: Self-pay | Admitting: Emergency Medicine

## 2009-04-29 ENCOUNTER — Emergency Department (HOSPITAL_COMMUNITY): Admission: EM | Admit: 2009-04-29 | Discharge: 2009-04-29 | Payer: Self-pay | Admitting: Family Medicine

## 2009-05-13 ENCOUNTER — Ambulatory Visit (HOSPITAL_COMMUNITY): Admission: RE | Admit: 2009-05-13 | Discharge: 2009-05-13 | Payer: Self-pay | Admitting: Emergency Medicine

## 2009-05-13 ENCOUNTER — Emergency Department (HOSPITAL_COMMUNITY): Admission: EM | Admit: 2009-05-13 | Discharge: 2009-05-13 | Payer: Self-pay | Admitting: Emergency Medicine

## 2009-05-13 ENCOUNTER — Ambulatory Visit: Payer: Self-pay | Admitting: Vascular Surgery

## 2009-05-13 ENCOUNTER — Encounter (INDEPENDENT_AMBULATORY_CARE_PROVIDER_SITE_OTHER): Payer: Self-pay | Admitting: Emergency Medicine

## 2009-05-17 ENCOUNTER — Encounter: Admission: RE | Admit: 2009-05-17 | Discharge: 2009-05-17 | Payer: Self-pay | Admitting: Interventional Radiology

## 2009-05-24 ENCOUNTER — Emergency Department (HOSPITAL_COMMUNITY): Admission: EM | Admit: 2009-05-24 | Discharge: 2009-05-24 | Payer: Self-pay | Admitting: Emergency Medicine

## 2009-06-07 ENCOUNTER — Emergency Department (HOSPITAL_COMMUNITY): Admission: EM | Admit: 2009-06-07 | Discharge: 2009-06-08 | Payer: Self-pay | Admitting: Emergency Medicine

## 2009-06-24 ENCOUNTER — Emergency Department (HOSPITAL_COMMUNITY): Admission: EM | Admit: 2009-06-24 | Discharge: 2009-06-25 | Payer: Self-pay | Admitting: Emergency Medicine

## 2009-07-25 ENCOUNTER — Emergency Department (HOSPITAL_COMMUNITY): Admission: EM | Admit: 2009-07-25 | Discharge: 2009-07-25 | Payer: Self-pay | Admitting: Emergency Medicine

## 2009-08-30 ENCOUNTER — Emergency Department (HOSPITAL_COMMUNITY): Admission: EM | Admit: 2009-08-30 | Discharge: 2009-08-30 | Payer: Self-pay | Admitting: Emergency Medicine

## 2009-09-16 ENCOUNTER — Emergency Department (HOSPITAL_COMMUNITY): Admission: EM | Admit: 2009-09-16 | Discharge: 2009-09-16 | Payer: Self-pay | Admitting: Emergency Medicine

## 2009-09-26 ENCOUNTER — Ambulatory Visit: Payer: Self-pay | Admitting: Family Medicine

## 2009-09-26 ENCOUNTER — Encounter: Payer: Self-pay | Admitting: Family Medicine

## 2009-09-26 DIAGNOSIS — E669 Obesity, unspecified: Secondary | ICD-10-CM

## 2009-09-26 DIAGNOSIS — G894 Chronic pain syndrome: Secondary | ICD-10-CM | POA: Insufficient documentation

## 2009-09-26 DIAGNOSIS — F209 Schizophrenia, unspecified: Secondary | ICD-10-CM | POA: Insufficient documentation

## 2009-09-26 DIAGNOSIS — F323 Major depressive disorder, single episode, severe with psychotic features: Secondary | ICD-10-CM | POA: Insufficient documentation

## 2009-09-26 DIAGNOSIS — I10 Essential (primary) hypertension: Secondary | ICD-10-CM | POA: Insufficient documentation

## 2009-09-26 LAB — CONVERTED CEMR LAB
Alkaline Phosphatase: 105 units/L (ref 39–117)
BUN: 12 mg/dL (ref 6–23)
Cholesterol: 244 mg/dL — ABNORMAL HIGH (ref 0–200)
Creatinine, Ser: 0.79 mg/dL (ref 0.40–1.20)
Glucose, Bld: 86 mg/dL (ref 70–99)
HDL: 84 mg/dL (ref >39)
LDL Cholesterol: 142 mg/dL — ABNORMAL HIGH (ref 0–99)
Sodium: 140 meq/L (ref 135–145)
Total Bilirubin: 0.3 mg/dL (ref 0.3–1.2)
Triglycerides: 89 mg/dL (ref <150)
VLDL: 18 mg/dL (ref 0–40)

## 2009-11-12 ENCOUNTER — Emergency Department (HOSPITAL_COMMUNITY): Admission: EM | Admit: 2009-11-12 | Discharge: 2009-11-12 | Payer: Self-pay | Admitting: Emergency Medicine

## 2009-11-14 ENCOUNTER — Encounter: Payer: Self-pay | Admitting: Family Medicine

## 2009-11-14 ENCOUNTER — Ambulatory Visit: Payer: Self-pay | Admitting: Family Medicine

## 2009-12-04 ENCOUNTER — Emergency Department (HOSPITAL_COMMUNITY): Admission: EM | Admit: 2009-12-04 | Discharge: 2009-12-04 | Payer: Self-pay | Admitting: Emergency Medicine

## 2009-12-07 ENCOUNTER — Telehealth: Payer: Self-pay | Admitting: Family Medicine

## 2009-12-12 ENCOUNTER — Encounter: Payer: Self-pay | Admitting: Family Medicine

## 2009-12-16 ENCOUNTER — Emergency Department (HOSPITAL_COMMUNITY): Admission: EM | Admit: 2009-12-16 | Discharge: 2009-12-16 | Payer: Self-pay | Admitting: Emergency Medicine

## 2009-12-23 DIAGNOSIS — I2699 Other pulmonary embolism without acute cor pulmonale: Secondary | ICD-10-CM

## 2009-12-23 HISTORY — DX: Other pulmonary embolism without acute cor pulmonale: I26.99

## 2009-12-29 ENCOUNTER — Emergency Department (HOSPITAL_COMMUNITY): Admission: EM | Admit: 2009-12-29 | Discharge: 2009-12-29 | Payer: Self-pay | Admitting: Family Medicine

## 2010-01-03 ENCOUNTER — Encounter: Payer: Self-pay | Admitting: *Deleted

## 2010-01-03 ENCOUNTER — Telehealth: Payer: Self-pay | Admitting: *Deleted

## 2010-01-07 ENCOUNTER — Emergency Department (HOSPITAL_COMMUNITY): Admission: EM | Admit: 2010-01-07 | Discharge: 2010-01-07 | Payer: Self-pay | Admitting: Emergency Medicine

## 2010-01-10 ENCOUNTER — Ambulatory Visit: Payer: Self-pay | Admitting: Family Medicine

## 2010-01-10 DIAGNOSIS — E785 Hyperlipidemia, unspecified: Secondary | ICD-10-CM

## 2010-01-10 DIAGNOSIS — J45909 Unspecified asthma, uncomplicated: Secondary | ICD-10-CM

## 2010-01-16 ENCOUNTER — Encounter: Payer: Self-pay | Admitting: Emergency Medicine

## 2010-01-17 ENCOUNTER — Encounter: Payer: Self-pay | Admitting: Family Medicine

## 2010-01-17 ENCOUNTER — Ambulatory Visit: Payer: Self-pay | Admitting: Family Medicine

## 2010-01-17 ENCOUNTER — Ambulatory Visit: Payer: Self-pay | Admitting: Cardiovascular Disease

## 2010-01-17 ENCOUNTER — Inpatient Hospital Stay (HOSPITAL_COMMUNITY): Admission: EM | Admit: 2010-01-17 | Discharge: 2010-01-20 | Payer: Self-pay | Admitting: Family Medicine

## 2010-01-18 ENCOUNTER — Encounter: Payer: Self-pay | Admitting: Family Medicine

## 2010-01-19 ENCOUNTER — Telehealth: Payer: Self-pay | Admitting: Family Medicine

## 2010-01-21 ENCOUNTER — Encounter: Payer: Self-pay | Admitting: Family Medicine

## 2010-01-23 ENCOUNTER — Telehealth: Payer: Self-pay | Admitting: Family Medicine

## 2010-01-24 ENCOUNTER — Encounter: Payer: Self-pay | Admitting: *Deleted

## 2010-01-24 ENCOUNTER — Encounter: Payer: Self-pay | Admitting: Family Medicine

## 2010-01-27 ENCOUNTER — Encounter: Payer: Self-pay | Admitting: Family Medicine

## 2010-01-30 ENCOUNTER — Ambulatory Visit: Payer: Self-pay | Admitting: Family Medicine

## 2010-01-30 ENCOUNTER — Telehealth: Payer: Self-pay | Admitting: *Deleted

## 2010-01-30 DIAGNOSIS — Z86711 Personal history of pulmonary embolism: Secondary | ICD-10-CM

## 2010-02-06 ENCOUNTER — Ambulatory Visit: Payer: Self-pay | Admitting: Family Medicine

## 2010-02-13 ENCOUNTER — Ambulatory Visit: Payer: Self-pay | Admitting: Family Medicine

## 2010-02-24 ENCOUNTER — Ambulatory Visit: Payer: Self-pay | Admitting: Family Medicine

## 2010-03-06 ENCOUNTER — Ambulatory Visit: Payer: Self-pay | Admitting: Family Medicine

## 2010-03-06 ENCOUNTER — Emergency Department (HOSPITAL_COMMUNITY): Admission: EM | Admit: 2010-03-06 | Discharge: 2010-03-06 | Payer: Self-pay | Admitting: Emergency Medicine

## 2010-03-06 LAB — CONVERTED CEMR LAB: INR: 2.5

## 2010-03-16 ENCOUNTER — Other Ambulatory Visit: Payer: Self-pay | Admitting: Emergency Medicine

## 2010-03-17 ENCOUNTER — Inpatient Hospital Stay (HOSPITAL_COMMUNITY): Admission: EM | Admit: 2010-03-17 | Discharge: 2010-03-21 | Payer: Self-pay | Admitting: Psychiatry

## 2010-03-17 ENCOUNTER — Inpatient Hospital Stay (HOSPITAL_COMMUNITY): Admission: EM | Admit: 2010-03-17 | Discharge: 2010-03-17 | Payer: Self-pay | Admitting: Family Medicine

## 2010-03-17 ENCOUNTER — Encounter: Payer: Self-pay | Admitting: Family Medicine

## 2010-03-17 ENCOUNTER — Ambulatory Visit: Payer: Self-pay | Admitting: Psychiatry

## 2010-03-17 ENCOUNTER — Ambulatory Visit: Payer: Self-pay | Admitting: Family Medicine

## 2010-03-23 ENCOUNTER — Emergency Department (HOSPITAL_COMMUNITY): Admission: EM | Admit: 2010-03-23 | Discharge: 2010-03-23 | Payer: Self-pay | Admitting: Emergency Medicine

## 2010-03-30 ENCOUNTER — Encounter: Payer: Self-pay | Admitting: *Deleted

## 2010-04-03 ENCOUNTER — Ambulatory Visit: Payer: Self-pay | Admitting: Family Medicine

## 2010-04-03 ENCOUNTER — Encounter: Payer: Self-pay | Admitting: Family Medicine

## 2010-04-05 ENCOUNTER — Telehealth: Payer: Self-pay | Admitting: Family Medicine

## 2010-04-06 ENCOUNTER — Ambulatory Visit: Payer: Self-pay | Admitting: Family Medicine

## 2010-04-06 LAB — CONVERTED CEMR LAB: INR: 1.2

## 2010-04-13 ENCOUNTER — Ambulatory Visit: Payer: Self-pay | Admitting: Family Medicine

## 2010-04-13 LAB — CONVERTED CEMR LAB: INR: 1.7

## 2010-04-14 ENCOUNTER — Telehealth: Payer: Self-pay | Admitting: Family Medicine

## 2010-04-20 ENCOUNTER — Telehealth: Payer: Self-pay | Admitting: Family Medicine

## 2010-04-21 ENCOUNTER — Ambulatory Visit: Payer: Self-pay | Admitting: Family Medicine

## 2010-04-28 ENCOUNTER — Ambulatory Visit: Payer: Self-pay | Admitting: Family Medicine

## 2010-04-28 LAB — CONVERTED CEMR LAB: INR: 2.4

## 2010-04-29 ENCOUNTER — Emergency Department (HOSPITAL_COMMUNITY): Admission: EM | Admit: 2010-04-29 | Discharge: 2010-04-29 | Payer: Self-pay | Admitting: Emergency Medicine

## 2010-05-02 ENCOUNTER — Ambulatory Visit: Payer: Self-pay | Admitting: Family Medicine

## 2010-05-02 ENCOUNTER — Telehealth: Payer: Self-pay | Admitting: Family Medicine

## 2010-05-03 ENCOUNTER — Telehealth: Payer: Self-pay | Admitting: Family Medicine

## 2010-05-04 ENCOUNTER — Ambulatory Visit: Payer: Self-pay | Admitting: Family Medicine

## 2010-05-04 LAB — CONVERTED CEMR LAB: INR: 2.9

## 2010-05-07 ENCOUNTER — Emergency Department (HOSPITAL_COMMUNITY): Admission: EM | Admit: 2010-05-07 | Discharge: 2010-05-07 | Payer: Self-pay | Admitting: Emergency Medicine

## 2010-05-07 LAB — CONVERTED CEMR LAB: ALT: 12 units/L

## 2010-05-10 ENCOUNTER — Telehealth: Payer: Self-pay | Admitting: Family Medicine

## 2010-05-11 ENCOUNTER — Telehealth: Payer: Self-pay | Admitting: Family Medicine

## 2010-05-15 ENCOUNTER — Ambulatory Visit: Payer: Self-pay | Admitting: Family Medicine

## 2010-05-24 ENCOUNTER — Ambulatory Visit: Payer: Self-pay | Admitting: Family Medicine

## 2010-06-03 ENCOUNTER — Emergency Department (HOSPITAL_COMMUNITY): Admission: EM | Admit: 2010-06-03 | Discharge: 2010-06-03 | Payer: Self-pay | Admitting: Emergency Medicine

## 2010-06-05 ENCOUNTER — Telehealth: Payer: Self-pay | Admitting: Family Medicine

## 2010-06-06 ENCOUNTER — Telehealth: Payer: Self-pay | Admitting: Family Medicine

## 2010-06-08 ENCOUNTER — Ambulatory Visit: Payer: Self-pay | Admitting: Family Medicine

## 2010-06-13 ENCOUNTER — Encounter: Payer: Self-pay | Admitting: Family Medicine

## 2010-06-14 ENCOUNTER — Telehealth: Payer: Self-pay | Admitting: *Deleted

## 2010-06-14 ENCOUNTER — Emergency Department (HOSPITAL_COMMUNITY): Admission: EM | Admit: 2010-06-14 | Discharge: 2010-06-14 | Payer: Self-pay | Admitting: Emergency Medicine

## 2010-06-18 ENCOUNTER — Ambulatory Visit: Payer: Self-pay | Admitting: Family Medicine

## 2010-06-18 ENCOUNTER — Encounter: Payer: Self-pay | Admitting: Family Medicine

## 2010-06-18 ENCOUNTER — Observation Stay (HOSPITAL_COMMUNITY): Admission: EM | Admit: 2010-06-18 | Discharge: 2010-06-19 | Payer: Self-pay | Admitting: Emergency Medicine

## 2010-06-18 DIAGNOSIS — R748 Abnormal levels of other serum enzymes: Secondary | ICD-10-CM | POA: Insufficient documentation

## 2010-06-19 LAB — CONVERTED CEMR LAB
Glucose, Urine, Semiquant: 78
Potassium: 3.8 meq/L
Sodium: 139 meq/L

## 2010-06-21 ENCOUNTER — Telehealth: Payer: Self-pay | Admitting: *Deleted

## 2010-06-25 ENCOUNTER — Encounter: Payer: Self-pay | Admitting: Family Medicine

## 2010-06-28 ENCOUNTER — Emergency Department (HOSPITAL_COMMUNITY): Admission: EM | Admit: 2010-06-28 | Discharge: 2010-06-28 | Payer: Self-pay | Admitting: Emergency Medicine

## 2010-06-30 ENCOUNTER — Telehealth: Payer: Self-pay | Admitting: Family Medicine

## 2010-06-30 ENCOUNTER — Ambulatory Visit: Payer: Self-pay | Admitting: Family Medicine

## 2010-06-30 LAB — CONVERTED CEMR LAB: INR: 1.7

## 2010-07-01 ENCOUNTER — Telehealth: Payer: Self-pay | Admitting: Family Medicine

## 2010-07-02 ENCOUNTER — Emergency Department (HOSPITAL_COMMUNITY): Admission: EM | Admit: 2010-07-02 | Discharge: 2010-07-02 | Payer: Self-pay | Admitting: Emergency Medicine

## 2010-07-26 ENCOUNTER — Ambulatory Visit: Payer: Self-pay | Admitting: Family Medicine

## 2010-08-02 ENCOUNTER — Emergency Department (HOSPITAL_COMMUNITY): Admission: EM | Admit: 2010-08-02 | Discharge: 2010-08-02 | Payer: Self-pay | Admitting: Emergency Medicine

## 2010-08-08 ENCOUNTER — Telehealth: Payer: Self-pay | Admitting: Family Medicine

## 2010-08-24 ENCOUNTER — Telehealth: Payer: Self-pay | Admitting: Family Medicine

## 2010-08-28 ENCOUNTER — Encounter: Payer: Self-pay | Admitting: Family Medicine

## 2010-08-28 ENCOUNTER — Ambulatory Visit: Payer: Self-pay | Admitting: Family Medicine

## 2010-08-28 LAB — CONVERTED CEMR LAB: INR: 3.3

## 2010-08-29 ENCOUNTER — Encounter: Payer: Self-pay | Admitting: Family Medicine

## 2010-08-30 ENCOUNTER — Telehealth: Payer: Self-pay | Admitting: Family Medicine

## 2010-08-31 ENCOUNTER — Encounter: Payer: Self-pay | Admitting: Family Medicine

## 2010-08-31 ENCOUNTER — Emergency Department (HOSPITAL_COMMUNITY)
Admission: EM | Admit: 2010-08-31 | Discharge: 2010-08-31 | Payer: Self-pay | Source: Home / Self Care | Admitting: Emergency Medicine

## 2010-08-31 ENCOUNTER — Ambulatory Visit: Payer: Self-pay | Admitting: Oncology

## 2010-09-04 ENCOUNTER — Telehealth: Payer: Self-pay | Admitting: *Deleted

## 2010-09-05 ENCOUNTER — Telehealth: Payer: Self-pay | Admitting: Family Medicine

## 2010-09-05 ENCOUNTER — Ambulatory Visit: Payer: Self-pay

## 2010-09-11 ENCOUNTER — Ambulatory Visit: Payer: Self-pay | Admitting: Family Medicine

## 2010-09-13 LAB — CONVERTED CEMR LAB: INR: 3.1

## 2010-09-19 ENCOUNTER — Emergency Department (HOSPITAL_COMMUNITY)
Admission: EM | Admit: 2010-09-19 | Discharge: 2010-09-19 | Payer: Self-pay | Source: Home / Self Care | Admitting: Emergency Medicine

## 2010-09-27 ENCOUNTER — Ambulatory Visit: Admit: 2010-09-27 | Payer: Self-pay

## 2010-09-29 ENCOUNTER — Telehealth: Payer: Self-pay | Admitting: Family Medicine

## 2010-10-02 ENCOUNTER — Ambulatory Visit (HOSPITAL_BASED_OUTPATIENT_CLINIC_OR_DEPARTMENT_OTHER): Payer: Medicare Other | Admitting: Oncology

## 2010-10-06 ENCOUNTER — Emergency Department (HOSPITAL_COMMUNITY)
Admission: EM | Admit: 2010-10-06 | Discharge: 2010-10-06 | Payer: Self-pay | Source: Home / Self Care | Admitting: Emergency Medicine

## 2010-10-15 ENCOUNTER — Encounter: Payer: Self-pay | Admitting: Interventional Radiology

## 2010-10-15 ENCOUNTER — Encounter: Payer: Self-pay | Admitting: Internal Medicine

## 2010-10-16 ENCOUNTER — Encounter: Payer: Self-pay | Admitting: Interventional Radiology

## 2010-10-18 ENCOUNTER — Ambulatory Visit: Admit: 2010-10-18 | Payer: Self-pay

## 2010-10-23 ENCOUNTER — Emergency Department (HOSPITAL_COMMUNITY)
Admission: EM | Admit: 2010-10-23 | Discharge: 2010-10-23 | Payer: Self-pay | Source: Home / Self Care | Admitting: Emergency Medicine

## 2010-10-24 NOTE — Miscellaneous (Signed)
Summary: walk in  Clinical Lists Changes walked in asking for hosp f/u appt. gave her a copy of the probation letter & she states she got one in the mail. states she has missed so many because she was in the hospital. appt with pcp today at 3pm. asked her to bring all pill bottles & paperwoprk from d/c.  she wanted refills. told her md will do that at visit.Marland KitchenMarland KitchenGolden Circle RN  April 03, 2010 9:11 AM

## 2010-10-24 NOTE — Assessment & Plan Note (Signed)
Summary: f/up vist   Vital Signs:  Patient profile:   70 year old female Weight:      239 pounds Temp:     98.4 degrees F oral Pulse rate:   55 / minute BP sitting:   197 / 91  (right arm) Cuff size:   large  Vitals Entered By: Jimmy Footman, CMA (July 26, 2010 1:33 PM) CC: f/u Is Patient Diabetic? No   Primary Care Provider:  Ellin Mayhew MD  CC:  f/u.  History of Present Illness: depression: phq-9 score of 18.  pt seeing [facility] for all mental health needs- medications and therapy.  Gave updated contact info for provider.  see asess and plan.  HTN: elevated today to 190's/90's.  bp recheck same.  Pt not on HCTZ, has forgotten to take.  Sent in Rx to her pharmacy.  Encouraged pt  back pain: pt states that she still has back pain but this isn't her chief complaint today.  She states that she is taking the tylenol and tramadol as directed.  No changes. no urinary or bowel problems. no fever.  simvastatin: pt states that this was d/c'd at a recent er visit.  pt doesn't remember when or why.        Habits & Providers  Exercise-Depression-Behavior     Have you felt down or hopeless? yes     Have you felt little pleasure in things? yes     Depression Counseling: further diagnostic testing and/or other treatment is indicated  -  Date:  06/19/2010    NA 139    K 3.8    CREAT 0.76    GLU 78    HGB 9.1    PLTS 232  Date:  05/07/2010    SGOT 18    SGPT 12  Current Medications (verified): 1)  Atenolol 100 Mg Tabs (Atenolol) .Marland Kitchen.. 1 Tab By Mouth Daily 2)  Meclizine Hcl 25 Mg Tabs (Meclizine Hcl) .Marland Kitchen.. 1 Tab By Mouth Q 8 Hrs As Needed Dizziness 3)  Clonazepam 0.5 Mg Tabs (Clonazepam) .Marland Kitchen.. 1 Tab By Mouth Bid 4)  Hydrochlorothiazide 25 Mg Tabs (Hydrochlorothiazide) .Marland Kitchen.. 1 Tab By Mouth Daily For High Blood Pressure 5)  Fluoxetine Hcl 20 Mg Tabs (Fluoxetine Hcl) .... 3 Tabs By Mouth Qam 6)  Nexium 40 Mg Cpdr (Esomeprazole Magnesium) .Marland Kitchen.. 1 Tab By Mouth Daily  For Reflux 7)  Flovent Hfa 220 Mcg/act Aero (Fluticasone Propionate  Hfa) .Marland Kitchen.. 1 Puff Inhaled Bid 8)  Ventolin Hfa 108 (90 Base) Mcg/act Aers (Albuterol Sulfate) .... 2 Puffs Q 4 Hrs As Needed Shortness of Breath or Wheezing. 9)  Potassium Chloride Cr 10 Meq Cr-Tabs (Potassium Chloride) .... 2 Tabs By Mouth Daily 10)  Tramadol Hcl 50 Mg Tabs (Tramadol Hcl) .Marland Kitchen.. 1 Tab By Mouth Three Times A Day As Needed For Pain. 11)  Coumadin 5 Mg Tabs (Warfarin Sodium) .... Take As Directed  Allergies (verified): No Known Drug Allergies  Review of Systems       no sob, no cp, occasional le edema.  no h/a, no blurry vision, no syncope.  no bowel or bladder changes.   Physical Exam  General:  bp 197/91 VS otherwise stable Well-developed,well-nourished,in no acute distress; alert,appropriate and cooperative throughout examination Lungs:  Normal respiratory effort, chest expands symmetrically. Lungs are clear to auscultation, no crackles or wheezes. Heart:  Normal rate and regular rhythm. S1 and S2 normal without gallop, murmur, click, rub or other extra sounds. Extremities:  trace edema  bilateral Skin:  Intact without suspicious lesions or rashes Psych:  PHQ-9-- 18   Impression & Recommendations:  Problem # 1:  COUMADIN THERAPY (ICD-V58.61) pt states that she has been taking coumadin as directed and coming to lab appts.  Pt reminded of the importance of this therapy. she states understanding.  Orders: [facility]- Est  Level 4 ([address])  Problem # 2:  HYPERLIPIDEMIA (ICD-272.4) Pt states that she was taken off simvastatin when she went to the ER.  Unsure if this was 2/2 chronic back pain?  Pt is unsure why the d/c'd it.  I will check a lipid panel with her next lab draw to reassess if she still needs statin.  If she does I will start her on generic lipitor that has less interactions.  The following medications were removed from the medication list:    Simvastatin 40 Mg Tabs (Simvastatin) .Marland Kitchen... 1 tab by  mouth qhs for high cholesterol  Orders: [facility]- Est  Level 4 (99214)Future Orders: Lipid-[facility] ([id number]) ... 07/26/2011  Problem # 3:  ESSENTIAL HYPERTENSION, BENIGN (ICD-401.1) BP elevated today.  Pt has not been taking her HCTZ.  Have given pt another rx for HCTZ and explained importance.  Pt states she will take her meds and return in 1 week for bp check.  Will return in 2-3 weeks for follow up with me.   Her updated medication list for this problem includes:    Atenolol 100 Mg Tabs (Atenolol) .Marland Kitchen... 1 tab by mouth daily    Hydrochlorothiazide 25 Mg Tabs (Hydrochlorothiazide) .Marland Kitchen... 1 tab by mouth daily for high blood pressure  Orders: [facility]- Est  Level 4 ([address]214)  Problem # 4:  DEPRESSION (ICD-311) PHQ-9 at todays assessment score of 18.  Have not got a call back from [facility].  Pt gave me an alternate call number and a new provider that she is seeing there-  harriet long- [phone]- her next appt is Dec 12 at 3pm.  I will try to call this new number.  She states that her fluoxetine has been increased to 60mg  daily.  I will confirm this with [facility].  Pt states that she now gets her therapy through [facility] as well.   Her updated medication list for this problem includes:    Clonazepam 0.5 Mg Tabs (Clonazepam) .Marland Kitchen... 1 tab by mouth bid    Fluoxetine Hcl 20 Mg Tabs (Fluoxetine hcl) .Marland KitchenMarland KitchenMarland KitchenMarland Kitchen 3 tabs by mouth qam  Orders: [facility]- Est  Level 4 (99214)  Problem # 5:  OTHER CHRONIC PAIN (ICD-338.29) reviewed pt pain med regimen of tylenol and tramadol with pt.  Reviewed with pt that if this isn't controlling her pain the next step is to go to pain management clinic.  She states that she doesn't want to do that at this time.  Orders: [facility]- Est  Level 4 (56213)  Problem # 6:  Preventive Health Care (ICD-V70.0) CBC, CMET levels from last hospital admissions in aug and sept were added to chart.   Pt has systolic murmur.  Will request last cards note to see if it is an old murmur if  it is new I will consider getting an echo of her heart.  Will review [facility] form with pt. when all acute and active issues are better dealt with.    Complete Medication List: 1)  Atenolol 100 Mg Tabs (Atenolol) .Marland Kitchen.. 1 tab by mouth daily 2)  Meclizine Hcl 25 Mg Tabs (Meclizine hcl) .Marland Kitchen.. 1 tab by mouth q 8 hrs as  needed dizziness 3)  Clonazepam 0.5 Mg Tabs (Clonazepam) .Marland Kitchen.. 1 tab by mouth bid 4)  Hydrochlorothiazide 25 Mg Tabs (Hydrochlorothiazide) .Marland Kitchen.. 1 tab by mouth daily for high blood pressure 5)  Fluoxetine Hcl 20 Mg Tabs (Fluoxetine hcl) .... 3 tabs by mouth qam 6)  Nexium 40 Mg Cpdr (Esomeprazole magnesium) .Marland Kitchen.. 1 tab by mouth daily for reflux 7)  Flovent Hfa 220 Mcg/act Aero (Fluticasone propionate  hfa) .Marland Kitchen.. 1 puff inhaled bid 8)  Ventolin Hfa 108 (90 Base) Mcg/act Aers (Albuterol sulfate) .... 2 puffs q 4 hrs as needed shortness of breath or wheezing. 9)  Potassium Chloride Cr 10 Meq Cr-tabs (Potassium chloride) .... 2 tabs by mouth daily 10)  Tramadol Hcl 50 Mg Tabs (Tramadol hcl) .Marland Kitchen.. 1 tab by mouth three times a day as needed for pain. 11)  Coumadin 5 Mg Tabs (Warfarin sodium) .... Take as directed  Patient Instructions: 1)  Take HCTZ and other bp medications daily 2)  return in 1 week for bp recheck with nurse 3)  return in 1 month for bp follow up. 4)  for your back pain: 5)   Take tylenol 650mg  by mouth two times a day. 6)  If this doesn't control your pain take tramadol.  7)  If you would like me to talk with the pain management about possibly getting you back into their clinic.  8)  Will talk with [facility] about your depression.  Take depression medication as directed.  Prescriptions: HYDROCHLOROTHIAZIDE 25 MG TABS (HYDROCHLOROTHIAZIDE) 1 tab by mouth daily for high blood pressure  #30 x 5   Entered and Authorized by:   Ellin Mayhew MD   Signed by:   Ellin Mayhew MD on 07/26/2010   Method used:   Electronically to        [facility]  (retail)       [address]       [place], [place]  [id number]       Ph: [phone]       Fax: [phone]   RxID:   [id number]    Orders Added: 1)  [facility]- Est  Level 4 [24401] 2)  [facility] [[id number]]     ANTICOAGULATION RECORD PREVIOUS REGIMEN & LAB RESULTS Anticoagulation Diagnosis:  Pulmonary Embolism on  01/30/2010 Previous INR Goal Range:  2-3 on  01/30/2010 Previous INR:  1.7 on  06/30/2010 Previous Coumadin Dose(mg):  5mg  tablets on  01/30/2010 Previous Regimen:  7.5 mg - Tues & Sat;  5 mg - other days on  06/30/2010 Previous Coagulation Comments:  Pill box filled with her Coumadin dose for this week. on  04/21/2010  NEW REGIMEN & LAB RESULTS Current INR: 3.0 Regimen: continue same:  7.5 mg - Tues & Sat;   5 mg - other days  Provider: [facility] Repeat testing in: 2 weeks  08-09-10 Other Comments: ...............test performed by......Marland KitchenBonnie A. [place], MLS (ASCP)cm   Dose has been reviewed with patient or caretaker during this visit. Reviewed by: Mosie Lukes (ASCP)cm  Anticoagulation Visit Questionnaire Coumadin dose missed/changed:  No Abnormal Bleeding Symptoms:  No  Any diet changes including alcohol intake, vegetables or greens since the last visit:  No Any illnesses or hospitalizations since the last visit:  Yes      Recent Illness/Hospitalizations:  went to ED because she fell down escalator at [place] Any signs of clotting since the last visit (including chest discomfort, dizziness, shortness of breath, arm tingling, slurred speech, swelling or redness in leg):  No  MEDICATIONS ATENOLOL 100 MG TABS (ATENOLOL) 1 tab by mouth daily MECLIZINE HCL 25 MG TABS (MECLIZINE HCL) 1 tab by mouth q 8 hrs as needed dizziness CLONAZEPAM 0.5 MG TABS (CLONAZEPAM) 1 tab by mouth BID HYDROCHLOROTHIAZIDE 25 MG TABS (HYDROCHLOROTHIAZIDE) 1 tab by mouth daily for high blood pressure FLUOXETINE HCL 20 MG TABS (FLUOXETINE HCL) 3 tabs by mouth qAM NEXIUM 40 MG  CPDR (ESOMEPRAZOLE MAGNESIUM) 1 tab by mouth daily for reflux FLOVENT HFA 220 MCG/ACT AERO (FLUTICASONE PROPIONATE  HFA) 1 puff inhaled BID VENTOLIN HFA 108 (90 BASE) MCG/ACT AERS (ALBUTEROL SULFATE) 2 puffs q 4 hrs as needed shortness of breath or wheezing. POTASSIUM CHLORIDE CR 10 MEQ CR-TABS (POTASSIUM CHLORIDE) 2 tabs by mouth daily TRAMADOL HCL 50 MG TABS (TRAMADOL HCL) 1 tab by mouth three times a day as needed for pain. COUMADIN 5 MG TABS (WARFARIN SODIUM) take as directed      Prevention & Chronic Care Immunizations   Influenza vaccine: Not documented    Tetanus booster: Not documented    Pneumococcal vaccine: Not documented    H. zoster vaccine: Not documented  Colorectal Screening   Hemoccult: Not documented    Colonoscopy: Not documented  Other Screening   Pap smear: Not documented    Mammogram: Not documented    DXA bone density scan: Not documented   Smoking status: never  (06/08/2010)  Lipids   Total Cholesterol: 244  (09/26/2009)   LDL: 142  (09/26/2009)   LDL Direct: Not documented   HDL: 84  (09/26/2009)   Triglycerides: 89  (09/26/2009)    SGOT (AST): 18  (05/07/2010)   SGPT (ALT): 12  (05/07/2010)   Alkaline phosphatase: 105  (09/26/2009)   Total bilirubin: 0.3  (09/26/2009)  Hypertension   Last Blood Pressure: 197 / 91  (07/26/2010)   Serum creatinine: 0.76  (06/19/2010)   Serum potassium 3.8  (06/19/2010)  Self-Management Support :   Personal Goals (by the next clinic visit) :      Personal blood pressure goal: 140/90  (01/10/2010)     Personal LDL goal: 130  (01/10/2010)    Hypertension self-management support: Not documented    Hypertension self-management support not done because: Not indicated  (01/10/2010)    Lipid self-management support: Not documented     Lipid self-management support not done because: Not indicated  (01/10/2010)

## 2010-10-24 NOTE — Progress Notes (Signed)
Summary: triage  Phone Note Call from Patient Call back at Home Phone [phone]   Caller: Patient Summary of Call: Having pain in her back. Initial call taken by: Clydell Hakim,  May 02, 2010 10:22 AM  Follow-up for Phone Call        went to ED this weekend for back pain. has seen her ortho for injections in the past.  cannot currently get one since she is on coumadin. has appt with ortho thursday. wants something done now. unable to come in this am. appt at 3:15 today with Dr. Gomez Cleverly. asked her to bring all meds Follow-up by: Golden Circle RN,  May 02, 2010 10:23 AM

## 2010-10-24 NOTE — Miscellaneous (Signed)
Summary: not home for [facility] visit  Clinical Lists Changes they called [place] back to report pt was not home for the appt they had set up for today. they will try only one more time & will close the case. They want to know what dose of coumadin & lovenox? what if the pt will not let them in to do PT/INR? to pcp Golden Circle RN  Jan 24, 2010 4:42 PM  Per Discharge summary, she appears to be on 7mg  coumadin daily and lovenox 150mg  daily for 6 days only, which will be up on 01/26/10.  If INR is therapeutic, then she won't need more lovenox.  If she won't let them do PT/INR, she has appt with me on Monday.  We will check it then.   Ardeen Garland  MD  Jan 25, 2010 8:37 AM  PT INR results 21 & 2.5 on 7.5 mg coumidin - has one more day of Lovenox left - pls advise De Nurse  Jan 25, 2010 10:43 AM  Take last dose of lovenox.  Continue same coumadin.  Recheck Friday please. Ardeen Garland  MD  Jan 25, 2010 10:48 AM  called [facility] to give them this info. gave info to the nurse.Marland KitchenMarland KitchenGolden Circle RN  Jan 25, 2010 1:16 PM

## 2010-10-24 NOTE — Progress Notes (Signed)
Summary: needs referral  Phone Note Call from Patient Call back at Home Phone [phone]   Caller: Patient Summary of Call: needs a referral to [facility] for her memory loss Initial call taken by: De Nurse,  April 20, 2010 11:55 AM  Follow-up for Phone Call        Pt would need to come in for an evaluation for memory loss problems.  After we do our assessment if needed we can give a referral if indicated.  Pt will need to come in for an evaluation.  Will ask pt to schedule an appt to discuss further. Ellin Mayhew MD  April 20, 2010 12:22 PM

## 2010-10-24 NOTE — Consult Note (Signed)
Summary: [facility]   Imported By: De Nurse 08/31/2010 16:41:44  _____________________________________________________________________  External Attachment:    Type:   Image     Comment:   External Document

## 2010-10-24 NOTE — Progress Notes (Signed)
  Phone Note Outgoing Call   Call placed by: MD Call placed to: Harriett Long Summary of Call: Placed call to Harriett Long of [facility] to ask her about pt's rx of klonopin.  Previous pcp was prescribing this.  I left VM asking if this is a medication that they feel needs to be continued, also asked if they would like to prescribe all of her mental health medications.  Ellin Mayhew MD  August 08, 2010 5:42 PM

## 2010-10-24 NOTE — Progress Notes (Signed)
Summary: Req to switch meds  Phone Note Call from Patient Call back at Home Phone [phone]   Reason for Call: Talk to Nurse Summary of Call: pt asking to switch meds from tramadol to percocet, sts the tramadol doesn't  relieve the pain as long as percocet does.  Initial call taken by: Knox Royalty,  August 30, 2010 11:49 AM  Follow-up for Phone Call        will forward mesage to MD.   attempted calling patient back , no answer. Follow-up by: Theresia Lo RN,  August 30, 2010 12:01 PM  Additional Follow-up for Phone Call Additional follow up Details #1::        patient calls back. advised that I have sent message to MD and when she responds will call her back. patient now uses [facility] [place] she states. Additional Follow-up by: Theresia Lo RN,  August 30, 2010 3:03 PM    Additional Follow-up for Phone Call Additional follow up Details #2::    attempt to call pt to discuss her need for increased pain medication.  Pt has lots of pain from chronic pain issues.  She deals with pain on a daily basis and is seen by pain management clinic for this.  If this is a new type of pain she will need to come in for an evaluation.  If this is her chronic pain issue than she will need to follow up with pain management.   I have had multiple similiar discussions with the patient about pain at past follow up appt.  I would love to see the patient in clinic if she would like to discuss in person.  I have tried to call her but no answer and no vm for me to leave message.  Ellin Mayhew MD  August 30, 2010 3:58 PM

## 2010-10-24 NOTE — Progress Notes (Signed)
Summary: Rx Req  Phone Note Refill Request Call back at Home Phone [phone] Message from:  Patient  Refills Requested: Medication #1:  TRAMADOL HCL 50 MG TABS 1 tab by mouth three times a day as needed for pain. [facility]  Initial call taken by: Clydell Hakim,  June 14, 2010 2:40 PM  Follow-up for Phone Call        to pcp. pt has called twice Follow-up by: Golden Circle RN,  June 14, 2010 5:23 PM  Additional Follow-up for Phone Call Additional follow up Details #1::        told her the med was now ready. asked that she give [place] 3 biz days in the future so she will not have to wait with no pills left. advised her to cal the pharmacist first. she agreed Additional Follow-up by: Golden Circle RN,  June 15, 2010 9:37 AM    Prescriptions: TRAMADOL HCL 50 MG TABS (TRAMADOL HCL) 1 tab by mouth three times a day as needed for pain.  #90 x 3   Entered and Authorized by:   Ellin Mayhew MD   Signed by:   Ellin Mayhew MD on 06/15/2010   Method used:   Electronically to        [facility] (retail)       [address]       [place], [place]  [id number]       Ph: [phone]       Fax: [phone]   RxID:   [id number]

## 2010-10-24 NOTE — Assessment & Plan Note (Signed)
Summary: back pain   Vital Signs:  Patient profile:   70 year old female Weight:      250 pounds Temp:     98.2 degrees F oral Pulse rate:   55 / minute BP sitting:   153 / 74  (left arm) Cuff size:   regular  Vitals Entered By: Tessie Fass CMA (May 04, 2010 2:50 PM) CC: F/U Pain Assessment Patient in pain? yes        Primary Care Provider:  Ellin Mayhew MD  CC:  F/U.  History of Present Illness: Pt here for chronic back pain:    Pt reports that she has been sen by Dr. Alvester Morin a physical medicine doctor in the past to recieve shots in her back and she is very upset that she can no longer recieve these shots every 6 months b/c they won't do them now that she is on coumadin.  She has also had back surgery by Dr. Wynetta Emery for her chronic back pain issues.  her back pain persists and she has an appt set up with a "Spine Specialist" at [facility] for a 2nd opinion.  She is requesting a pain medication to "get her through" until her appt on 8/19.  Pt was seen by dr. Gomez Cleverly earlier this week and was given flexeril.  Pt states that she didn't even go pick it up because flexeril just makes her feel drunk and it doesn't help with the pain.  Pt states that the tramadol doesn't help.  Pt was also seen over the past weekend at the ER and was given valium.  She said that these did give some relief.  no loss of bowel or bladder control.  No weakness.  Pain is located at base of neck and goes down both arms.  Pain is also present at lower back.   Current Medications (verified): 1)  Atenolol 100 Mg Tabs (Atenolol) .Marland Kitchen.. 1 Tab By Mouth Daily 2)  Meclizine Hcl 25 Mg Tabs (Meclizine Hcl) .Marland Kitchen.. 1 Tab By Mouth Q 8 Hrs As Needed Dizziness 3)  Clonazepam 0.5 Mg Tabs (Clonazepam) .Marland Kitchen.. 1 Tab By Mouth Bid 4)  Hydrochlorothiazide 25 Mg Tabs (Hydrochlorothiazide) .Marland Kitchen.. 1 Tab By Mouth Daily For High Blood Pressure 5)  Fluoxetine Hcl 20 Mg Tabs (Fluoxetine Hcl) .... 2 Tabs By Mouth Qam 6)  Nexium 40 Mg Cpdr  (Esomeprazole Magnesium) .Marland Kitchen.. 1 Tab By Mouth Daily For Reflux 7)  Flovent Hfa 220 Mcg/act Aero (Fluticasone Propionate  Hfa) .Marland Kitchen.. 1 Puff Inhaled Bid 8)  Ventolin Hfa 108 (90 Base) Mcg/act Aers (Albuterol Sulfate) .... 2 Puffs Q 4 Hrs As Needed Shortness of Breath or Wheezing. 9)  Potassium Chloride Cr 10 Meq Cr-Tabs (Potassium Chloride) .... 2 Tabs By Mouth Daily 10)  Simvastatin 40 Mg Tabs (Simvastatin) .Marland Kitchen.. 1 Tab By Mouth Qhs For High Cholesterol 11)  Tramadol Hcl 50 Mg Tabs (Tramadol Hcl) .Marland Kitchen.. 1 Tab By Mouth Three Times A Day As Needed For Pain. 12)  Coumadin 5 Mg Tabs (Warfarin Sodium) .... Take As Directed 13)  Flexeril 10 Mg Tabs (Cyclobenzaprine Hcl) .... Take 1 Pill By Mouth At Bedtime As Needed Pain  Allergies (verified): No Known Drug Allergies  Review of Systems       as per hpi  Physical Exam  General:  VSS obese, ; alert,appropriate and cooperative throughout examination  Lungs:  Normal respiratory effort, chest expands symmetrically. Lungs are clear to auscultation, no crackles or wheezes. Heart:  Normal rate and regular rhythm.  S1 and S2 normal without gallop, murmur, click, rub or other extra sounds. Abdomen:  abdomen soft and non-tender without masses, obese Extremities:  No clubbing, cyanosis, edema, or deformity noted with normal full range of motion of all joints.   Neurologic:  strength equal bilateral, reflexes equal bilateral and  sesation equal bilateral in both arms and legs.   Psych:  Cognition and judgment appear intact. Alert and cooperative with normal attention span and concentration. No apparent delusions, illusions, hallucinations- PHQ-9--score of 6.  No SI or HI.   Impression & Recommendations:  Problem # 1:  OTHER CHRONIC PAIN (ICD-338.29) I hesitate to give pt pain medication 2/2 her recent admission for suspected overdose on coumadin and vicodin.  pt states that this was unintentional.  She denies SI today.  PHQ-9 test score of 6.  Will give pt 20  percocet that her husband will keep for her and help her decide when it is the appropriate time to take the medication.  I have informed pt that narcotics are not a good long term solution for chronic pain and that I will not be refilling these.  She states understanding.     Orders: [facility]- Est Level  3 (36644)  Problem # 2:  PULMONARY EMBOLISM (ICD-415.19) Pt states that she wants to stop coumadin b/c this is preventing her from getting her back injections for her back pain.  I reminded her that she will most likely be on lifelong therapy since this has happened twice now and that if she stops that coumadin that she is at risk for more clots which could lead to possible dealth.  Pt states understanding.   Her updated medication list for this problem includes:    Coumadin 5 Mg Tabs (Warfarin sodium) .Marland Kitchen... Take as directed  Orders: INR/PT-[facility] (03474) [facility]- Est Level  3 (25956)  Complete Medication List: 1)  Atenolol 100 Mg Tabs (Atenolol) .Marland Kitchen.. 1 tab by mouth daily 2)  Meclizine Hcl 25 Mg Tabs (Meclizine hcl) .Marland Kitchen.. 1 tab by mouth q 8 hrs as needed dizziness 3)  Clonazepam 0.5 Mg Tabs (Clonazepam) .Marland Kitchen.. 1 tab by mouth bid 4)  Hydrochlorothiazide 25 Mg Tabs (Hydrochlorothiazide) .Marland Kitchen.. 1 tab by mouth daily for high blood pressure 5)  Fluoxetine Hcl 20 Mg Tabs (Fluoxetine hcl) .... 2 tabs by mouth qam 6)  Nexium 40 Mg Cpdr (Esomeprazole magnesium) .Marland Kitchen.. 1 tab by mouth daily for reflux 7)  Flovent Hfa 220 Mcg/act Aero (Fluticasone propionate  hfa) .Marland Kitchen.. 1 puff inhaled bid 8)  Ventolin Hfa 108 (90 Base) Mcg/act Aers (Albuterol sulfate) .... 2 puffs q 4 hrs as needed shortness of breath or wheezing. 9)  Potassium Chloride Cr 10 Meq Cr-tabs (Potassium chloride) .... 2 tabs by mouth daily 10)  Simvastatin 40 Mg Tabs (Simvastatin) .Marland Kitchen.. 1 tab by mouth qhs for high cholesterol 11)  Tramadol Hcl 50 Mg Tabs (Tramadol hcl) .Marland Kitchen.. 1 tab by mouth three times a day as needed for pain. 12)  Coumadin 5 Mg Tabs (Warfarin  sodium) .... Take as directed 13)  Flexeril 10 Mg Tabs (Cyclobenzaprine hcl) .... Take 1 pill by mouth at bedtime as needed pain  Patient Instructions: 1)  Please return in 2-4 weeks so that we can talk more about your other health problems. 2)  Please take your medicines as directed.  Your husband will keep your medicines and help you decide when is the right time to take them.  3)  I will give you 20 percocet to help you  get through to your appointment with the spine specialist.  I do not plan on refilling this medication for you this is for short term only.  Narcotics are not supposed to be used for a long period of time since they have many side effects.  4)  Please talk about pain management with the spine specialist.   Have the [place] doctor fax the report to Dr. Edmonia James at [facility] fax number--[phone]   ANTICOAGULATION RECORD PREVIOUS REGIMEN & LAB RESULTS Anticoagulation Diagnosis:  Pulmonary Embolism on  01/30/2010 Previous INR Goal Range:  2-3 on  01/30/2010 Previous INR:  1.4 on  04/21/2010 Previous Coumadin Dose(mg):  5mg  tablets on  01/30/2010 Previous Regimen:  7.5mg  daily on  04/21/2010 Previous Coagulation Comments:  Pill box filled with her Coumadin dose for this week. on  04/21/2010  NEW REGIMEN & LAB RESULTS Current INR: 2.9 Regimen: 5 mg - M & Thurs;  7.5 mg - other days  Provider: Nazire Fruth Repeat testing in: 1 week  05-11-10 Other Comments: ...............test performed by......Marland KitchenBonnie A. [place], MLS (ASCP)cm   Dose has been reviewed with patient or caretaker during this visit. Reviewed by: Dr. Edmonia James  Anticoagulation Visit Questionnaire Coumadin dose missed/changed:  No Abnormal Bleeding Symptoms:  No  Any diet changes including alcohol intake, vegetables or greens since the last visit:  No Any illnesses or hospitalizations since the last visit:  No Any signs of clotting since the last visit (including chest discomfort, dizziness,  shortness of breath, arm tingling, slurred speech, swelling or redness in leg):  No  MEDICATIONS ATENOLOL 100 MG TABS (ATENOLOL) 1 tab by mouth daily MECLIZINE HCL 25 MG TABS (MECLIZINE HCL) 1 tab by mouth q 8 hrs as needed dizziness CLONAZEPAM 0.5 MG TABS (CLONAZEPAM) 1 tab by mouth BID HYDROCHLOROTHIAZIDE 25 MG TABS (HYDROCHLOROTHIAZIDE) 1 tab by mouth daily for high blood pressure FLUOXETINE HCL 20 MG TABS (FLUOXETINE HCL) 2 tabs by mouth qAM NEXIUM 40 MG CPDR (ESOMEPRAZOLE MAGNESIUM) 1 tab by mouth daily for reflux FLOVENT HFA 220 MCG/ACT AERO (FLUTICASONE PROPIONATE  HFA) 1 puff inhaled BID VENTOLIN HFA 108 (90 BASE) MCG/ACT AERS (ALBUTEROL SULFATE) 2 puffs q 4 hrs as needed shortness of breath or wheezing. POTASSIUM CHLORIDE CR 10 MEQ CR-TABS (POTASSIUM CHLORIDE) 2 tabs by mouth daily SIMVASTATIN 40 MG TABS (SIMVASTATIN) 1 tab by mouth qHS for high cholesterol TRAMADOL HCL 50 MG TABS (TRAMADOL HCL) 1 tab by mouth three times a day as needed for pain. COUMADIN 5 MG TABS (WARFARIN SODIUM) take as directed FLEXERIL 10 MG TABS (CYCLOBENZAPRINE HCL) take 1 pill by mouth at bedtime as needed pain

## 2010-10-24 NOTE — Initial Assessments (Signed)
Summary: History and Physical   Vital Signs:  Patient profile:   70 year old female O2 Sat:      100 % on Room air Pulse rate:   62 / minute Pulse rhythm:   regular Resp:     18 per minute BP supine:   133 / 72  (right arm)  Visit Type:  Hospital Admission Primary Provider:  Ellin Mayhew MD  CC:  Chest pain.  History of Present Illness: Patient is a 70 y/o aaf with c/o of resolved chest pain, weakness/diaphoresis at home, and back pain radiating to her legs. Mrs. Butsch has a history of Pulmonary embolism for which she is theraputic on Coumadin.  She was at home and walking around when she felt weak, was sweating and was doubled over from her acute on chronic back pain that radiated into her legs. She says she also experienced chest pain. The chest pain resolved at home before she came to the ED. She decided to come to the ED because she was worried about her heart.  She describes her chest pain as substernal, not related to exertion, resolved without medication. She did not use nitroglyercin. She had no arm or jaw symptoms. No SOB, no emesis, no LOC, no syncope, no fall. No prolonged immobility. No nausea/diarrhea/constipation. no acute leg pain. No fever chills or night sweats. she has a 60 lbs reported weight loss that she says happened because of a hospital admission this year for PE. Her Troponins have been negative x 2. She has a CK level of 1800. Cxray was wnl. Vitals are all wnl. Afebrile.  Anticoagulation Management History:      Positive risk factors for bleeding include an age of 72 years or older.  The bleeding index is 'intermediate risk'.  Positive CHADS2 values include History of HTN.  Negative CHADS2 values include Age > 26 years old.  Her last INR was 2.0.     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) 4)  Muscle Cramps  (ICD-729.82) 5)  Hyperlipidemia   (ICD-272.4) 6)  Essential Hypertension, Benign  (ICD-401.1) 7)  Obesity, Unspecified  (ICD-278.00) 8)  Depression  (ICD-311) 9)  Schizophrenia  (ICD-295.90) 10)  Other Chronic Pain  (ICD-338.29)  Current Problems (verified): 1)  Pulmonary Embolism  (ICD-415.19) 2)  Coumadin Therapy  (ICD-V58.61) 3)  Asthma, Persistent, Moderate  (ICD-493.90) 4)  Muscle Cramps  (ICD-729.82) 5)  Hyperlipidemia  (ICD-272.4) 6)  Essential Hypertension, Benign  (ICD-401.1) 7)  Obesity, Unspecified  (ICD-278.00) 8)  Depression  (ICD-311) 9)  Schizophrenia  (ICD-295.90) 10)  Other Chronic Pain  (ICD-338.29)  Medications Prior to Update: 1)  Atenolol 100 Mg Tabs (Atenolol) .Marland Kitchen.. 1 Tab By Mouth Daily 2)  Meclizine Hcl 25 Mg Tabs (Meclizine Hcl) .Marland Kitchen.. 1 Tab By Mouth Q 8 Hrs As Needed Dizziness 3)  Clonazepam 0.5 Mg Tabs (Clonazepam) .Marland Kitchen.. 1 Tab By Mouth Bid 4)  Hydrochlorothiazide 25 Mg Tabs (Hydrochlorothiazide) .Marland Kitchen.. 1 Tab By Mouth Daily For High Blood Pressure 5)  Fluoxetine Hcl 20 Mg Tabs (Fluoxetine Hcl) .... 2 Tabs By Mouth Qam 6)  Nexium 40 Mg Cpdr (Esomeprazole Magnesium) .Marland Kitchen.. 1 Tab By Mouth Daily For Reflux 7)  Flovent Hfa 220 Mcg/act Aero (Fluticasone Propionate  Hfa) .Marland Kitchen.. 1 Puff Inhaled Bid 8)  Ventolin Hfa 108 (90 Base) Mcg/act Aers (Albuterol Sulfate) .... 2 Puffs Q 4 Hrs As Needed Shortness of Breath or  Wheezing. 9)  Potassium Chloride Cr 10 Meq Cr-Tabs (Potassium Chloride) .... 2 Tabs By Mouth Daily 10)  Simvastatin 40 Mg Tabs (Simvastatin) .Marland Kitchen.. 1 Tab By Mouth Qhs For High Cholesterol 11)  Tramadol Hcl 50 Mg Tabs (Tramadol Hcl) .Marland Kitchen.. 1 Tab By Mouth Three Times A Day As Needed For Pain. 12)  Coumadin 5 Mg Tabs (Warfarin Sodium) .... Take As Directed  Current Medications (verified): 1)  Atenolol 100 Mg Tabs (Atenolol) .Marland Kitchen.. 1 Tab By Mouth Daily 2)  Meclizine Hcl 25 Mg Tabs (Meclizine Hcl) .Marland Kitchen.. 1 Tab By Mouth Q 8 Hrs As Needed Dizziness 3)  Clonazepam 0.5 Mg Tabs (Clonazepam) .Marland Kitchen.. 1 Tab By Mouth  Bid 4)  Hydrochlorothiazide 25 Mg Tabs (Hydrochlorothiazide) .Marland Kitchen.. 1 Tab By Mouth Daily For High Blood Pressure 5)  Fluoxetine Hcl 20 Mg Tabs (Fluoxetine Hcl) .... 2 Tabs By Mouth Qam 6)  Nexium 40 Mg Cpdr (Esomeprazole Magnesium) .Marland Kitchen.. 1 Tab By Mouth Daily For Reflux 7)  Flovent Hfa 220 Mcg/act Aero (Fluticasone Propionate  Hfa) .Marland Kitchen.. 1 Puff Inhaled Bid 8)  Ventolin Hfa 108 (90 Base) Mcg/act Aers (Albuterol Sulfate) .... 2 Puffs Q 4 Hrs As Needed Shortness of Breath or Wheezing. 9)  Potassium Chloride Cr 10 Meq Cr-Tabs (Potassium Chloride) .... 2 Tabs By Mouth Daily 10)  Simvastatin 40 Mg Tabs (Simvastatin) .Marland Kitchen.. 1 Tab By Mouth Qhs For High Cholesterol 11)  Tramadol Hcl 50 Mg Tabs (Tramadol Hcl) .Marland Kitchen.. 1 Tab By Mouth Three Times A Day As Needed For Pain. 12)  Coumadin 5 Mg Tabs (Warfarin Sodium) .... Take As Directed  Allergies (verified): No Known Drug Allergies  Past History:  Past Medical History: Last updated: 09/26/2009 Schizophrenia - follows with mental health and sees therapist - Dr. Luci Bank [place] Depression Hypertension GERD Anxiety Chronic Pain Asthma  Past Surgical History: Last updated: 09/26/2009 Left forearm/wrist fracture with pin placement 10/2005 Spinal fusion 2004 Right knee replacement 11/2008 BTL 1973  Family History: Last updated: 09/26/2009 Brother had colon cancer at age 87 Sister has diabetes  Social History: Last updated: 09/26/2009 Lives with her husband, [name], and her son, [name].  Has 2 other children.  [name] is mentally handicapped and has a severe seizure disorder.  SHe is primary caretaker for him.  No smoking, alcohol, drugs.  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  Never worked outside the home.   Risk Factors: Smoking Status: never (06/08/2010)  Review of Systems       The patient complains of weight loss, chest pain, dyspnea on exertion, muscle weakness, and difficulty walking.  The patient denies fever, vision loss, decreased hearing, syncope, headaches,  hemoptysis, abdominal pain, melena, hematochezia, hematuria, incontinence, and abnormal bleeding.    Physical Exam  General:  alert, well-developed, normal appearance, cooperative to examination, good hygiene, and overweight-appearing.   Head:  normocephalic and atraumatic.   Eyes:  vision grossly intact, pupils equal, pupils round, and pupils reactive to light.   Ears:  R ear normal and L ear normal.   Nose:  no external deformity.   Mouth:  teeth missing.   Neck:  No deformities, masses, or tenderness noted. Chest Wall:  No deformities, masses, or tenderness noted. Lungs:  Normal respiratory effort, chest expands symmetrically. Lungs are clear to auscultation, no crackles or wheezes. Heart:  Normal rate and regular rhythm. S1 and S2 normal without gallop, murmur, click, rub or other extra sounds. Abdomen:  Bowel sounds positive,abdomen soft and non-tender without masses, organomegaly or hernias noted. Pulses:  R and L  carotid,radial,femoral,dorsalis pedis and posterior tibial pulses are full and equal bilaterally Extremities:  No clubbing, cyanosis, edema, or deformity noted with normal full range of motion of all joints.   Neurologic:  No cranial nerve deficits noted. Station and gait are normal. Plantar reflexes are down-going bilaterally. DTRs are symmetrical throughout. Sensory, motor and coordinative functions appear intact. Skin:  Intact without suspicious lesions or rashes Psych:  Cognition and judgment appear intact. Alert and cooperative with normal attention span and concentration. No apparent delusions, illusions, hallucinationsOriented X3, memory intact for recent and remote, normally interactive, good eye contact, not anxious appearing, and not depressed appearing.    LABS:   WBC                                      6.4               4.0-10.5         K/uL  RBC                                      3.18       l      3.87-5.11        MIL/uL  Hemoglobin (HGB)                          9.2        l      12.0-15.0        g/dL  Hematocrit (HCT)                         28.4       l      36.0-46.0        %  MCV                                      89.3              78.0-100.0       fL  MCH -                                    28.9              26.0-34.0        pg  MCHC                                     32.4              30.0-36.0        g/dL  RDW                                      16.2       h      11.5-15.5        %  Platelet Count (PLT)  242               150-400          K/uL  Neutrophils, %                           61                43-77            %  Lymphocytes, %                           27                12-46            %  Monocytes, %                             8                 3-12             %  Eosinophils, %                           4                 0-5              %  Basophils, %                             0                 0-1              %  Neutrophils, Absolute                    3.9               1.  CKMB, POC                                29.3       H      1.0-8.0          ng/mL  Troponin I, POC                          <0.05             0.00-0.09        ng/mL  Myoglobin, POC                           289        H      12-200            INR                                      2.37       h      0.00-1.49   PTT(a-Partial Thromboplastn Time)        52  h      24-37              Sodium (NA)                              135               135-145          mEq/L  Potassium (K)                            3.5               3.5-5.1          mEq/L  Chloride                                 99                96-112           mEq/L  CO2                                      30                19-32            mEq/L  Glucose                                  78                70-99            mg/dL  BUN                                      12                6-23             mg/dL  Creatinine                               0.97               0.4-1.2           CXRAY 25th   Findings: Stable cardiomegaly.  Thoracic aorta is tortuous.  There   is prominence of the hila bilaterally, right greater than left,   similar compared to chest radiograph of 05/07/2010.  Pulmonary   vascularity is otherwise normal.  Lungs are clear.  No pleural   effusion.    IMPRESSION:   1. Bihilar prominence.  This appears similar compared to chest   radiograph of August 2011 and may reflect enlargement of the   central pulmonary arteries.  Lymphadenopathy difficult to exclude   (there was no lymphadenopathy at the time of CT in April 2011).   2.  The lungs are clear.   Patient is a 70 y/o aaf with resolved Chest pain R/O MI and possible Rhabdomyolysis.  Impression & Recommendations:  Problem # 1:  CHEST PAIN, ACUTE (ICD-786.50) Assessment Improved Chest  pain that started on Friday. Resolved spontaneously. Atypical.  Troponins 0.01 CK 1800 unlikely to be PE with INR 2.7 and without symptoms to suggest PE. -continue cardiac markers. EKG in am. ASA, on coumadin. Will follow.  -would recommend exercise stress test as outpatient once cleared.  Problem # 2:  COUMADIN THERAPY (ICD-V58.61) Assessment: Unchanged INR 2.7 today. On Coumadin for history of PE. Patient has no pleuritic chest pain. she has no tachycardia, no fever, her chest pain is now resolved.  will get D Dimer.  Problem # 3:  ASTHMA, PERSISTENT, MODERATE (ICD-493.90) Assessment: Unchanged  Her updated medication list for this problem includes:    Flovent Hfa 220 Mcg/act Aero (Fluticasone propionate  hfa) .Marland Kitchen... 1 puff inhaled bid    Ventolin Hfa 108 (90 Base) Mcg/act Aers (Albuterol sulfate) .Marland Kitchen... 2 puffs q 4 hrs as needed shortness of breath or wheezing.  Problem # 4:  MUSCLE CRAMPS (ICD-729.82) Patient complains of muscle cramps in both lower ext. she denies change in character. soft to palpation.  Problem # 5:  DEPRESSION (ICD-311)  Her updated medication list for  this problem includes:    Clonazepam 0.5 Mg Tabs (Clonazepam) .Marland Kitchen... 1 tab by mouth bid    Fluoxetine Hcl 20 Mg Tabs (Fluoxetine hcl) .Marland Kitchen... 2 tabs by mouth qam  Problem # 6:  OBESITY, UNSPECIFIED (ICD-278.00) Assessment: Improved patient reports 60+ lbs weight loss from recent hospitalization. advised to continue a healthy low calorie diet and exercise, thirty minutes daily.  Problem # 7:  CPK, ABNORMAL (ICD-790.5) Elevated CK level to 1800. No evidence to suggest Rhabdomyolysis.  Kidney function intact. continue to flush kidneys with IV fluids. No IV Dye if this can be avoided.  Patient taking Simvistatin at home, will DC for now in lieu of possible Rhabdo.  Problem # 8:  FEN GI Coumadin - INR theraputic Protonix Asthma Nebs Cardiac Diet  Problem # 9:  Disposition Continue observation and serial Cardiac markers to rule out myocardial infarction. continue observation to watch for Kidney injury with possible Rhabdo.  Complete Medication List: 1)  Atenolol 100 Mg Tabs (Atenolol) .Marland Kitchen.. 1 tab by mouth daily 2)  Meclizine Hcl 25 Mg Tabs (Meclizine hcl) .Marland Kitchen.. 1 tab by mouth q 8 hrs as needed dizziness 3)  Clonazepam 0.5 Mg Tabs (Clonazepam) .Marland Kitchen.. 1 tab by mouth bid 4)  Hydrochlorothiazide 25 Mg Tabs (Hydrochlorothiazide) .Marland Kitchen.. 1 tab by mouth daily for high blood pressure 5)  Fluoxetine Hcl 20 Mg Tabs (Fluoxetine hcl) .... 2 tabs by mouth qam 6)  Nexium 40 Mg Cpdr (Esomeprazole magnesium) .Marland Kitchen.. 1 tab by mouth daily for reflux 7)  Flovent Hfa 220 Mcg/act Aero (Fluticasone propionate  hfa) .Marland Kitchen.. 1 puff inhaled bid 8)  Ventolin Hfa 108 (90 Base) Mcg/act Aers (Albuterol sulfate) .... 2 puffs q 4 hrs as needed shortness of breath or wheezing. 9)  Potassium Chloride Cr 10 Meq Cr-tabs (Potassium chloride) .... 2 tabs by mouth daily 10)  Simvastatin 40 Mg Tabs (Simvastatin) .Marland Kitchen.. 1 tab by mouth qhs for high cholesterol 11)  Tramadol Hcl 50 Mg Tabs (Tramadol hcl) .Marland Kitchen.. 1 tab by mouth three times a day as  needed for pain. 12)  Coumadin 5 Mg Tabs (Warfarin sodium) .... Take as directed  Anticoagulation Management Assessment/Plan:      The patient's current anticoagulation dose is Coumadin 5 mg tabs: take as directed.  The next INR is due 1 week  05-31-10.         Current Anticoagulation Instructions: Coumadin 5  mg tabs: take as directed.    Appended Document: R3 addendum R3 addendum.  CC: chest tightness x 1 week  HPI.  70 yo here with chest tightness x  1 week, has resolved prior to presentation to ER.  patient is a poor historian.  What she described as chest tightness actually sounds like back pain.  Says it is worse when she lays down or sits in certain positions, better with she ambulates.  Not associated with meals.  Told other providers she is SOB, but denies to me.  Does state she uses Albuterol 3x a day but due to anxiety and not to SOB or wheezing.   Of note patient has a hsitory of PE in May 2011, And narcotic and coumadin overdose in June 2011.  ROS:  Neg except as per above  Please see full note above for full PMH, SH, Med list.  PE: T afebrile, Pulse 62, Resp 18, BP 133/72 PE:  Obese, NAD HEENT:  OMM, EOMI, PERRLA CV:  RRR, no mr/r/g Resp: CTAB Abd:  + BS, soft, no TTP Ext; no LE edema  Labs:  EKG, NSR, no ST elevation, depression or t wave inversions CXR:  bihilar prominence similar to previous CT.  lung fields clear.  Labs: Tropnonin: neg cpk: 1837 INR: 2.37 CBC: WBC: 6.4 Hgb 9.2 Plt 242 BMET: 135/3.5/99/30/78/12/0/97  A/P:  70 yo admitted with chest tightness found to have elevated CPK on labs 1.  Chest tightness:  atypical chest pain, not suggestive of ACS- will rule out with CE, repeat EKG in AM.  Will get D-Dimer to rule out PE as patient has a hsitory of one unprovoked PE.  She is therapeutic on warfarin - no change in management but repeat PE may change duration of anticoagulation. 2.  elevated CPK:  mild elevation, will follow.  IVF  to prevent dehydration.  hodl statin for now. 3.  Depression/Schizophrenia with paranoia:  I suspect this plays a role in her chest tightness.  Will continue home meds.  She is in counseling and sees behaviroal health.  She seems to have improved with medication comliance as well due to interventions.  Will monitor for signs of decompensation. 4.  PE:  Was in May 2011.  Therapeutic INR. 5.  Asthma:  at baseline.  continue flovent and albuterol as needed 6.  Chronic pain:  history of narcotic overdose.  Per PCP note, no narcotics.  May continue tramadol as needed. 7.  FEN/GI:  No hx of CHF>  Will use NS @ 100.  Heart healthy diet.  continue nexium 8.  HTN:  continue atenolol and hctz 9.  Dispo:  pending rule out acs and improvement in CK.  Delbert Harness MD  June 18, 2010 11:30 PM

## 2010-10-24 NOTE — Progress Notes (Signed)
Summary: phn msg  Phone Note From Other Clinic Call back at [phone]   Caller: [facility] Summary of Call: Pt called them threatening to stop her coumidin due to the back pain.  Just wanted to let Dr. Edmonia James know. Initial call taken by: Clydell Hakim,  June 06, 2010 9:24 AM  Follow-up for Phone Call        Spoke to pt at visit today about importance of taking coumadin.  Also talked to pt about why the ortho md's do not like to give injections to pt's on coumadin and the possible risks.  Pt states understanding. See today's office visit note. Ellin Mayhew MD  June 08, 2010 10:21 PM

## 2010-10-24 NOTE — Letter (Signed)
Summary: Probation Letter  [facility]  [address]   [place], [place] [address]   Phone: [phone]  Fax: [phone]    03/30/2010  RAMIE PALLADINO [address] [place], [place]  [address]  Dear Ms. MULA,  With the goal of better serving all our patients the [facility] is following each patient's missed appointments.  You have missed at least 3 appointments with our practice.If you cannot keep your appointment, we expect you to call at least 24 hours before your appointment time.  Missing appointments prevents other patients from seeing [place] and makes it difficult to provide you with the best possible medical care.      1.   If you miss one more appointment, we will only give you limited medical services. This means we will not call in medication refills, complete a form, or make a referral for you except when you are here for a scheduled office visit.    2.   If you miss 2 or more appointments in the next year, we will dismiss you from our practice.    Our office staff can be reached at [phone] Monday through Friday from 8:30 a.m.-5:00 p.m. and will be glad to schedule your appointment as necessary.    Thank you.   [facility]

## 2010-10-24 NOTE — Miscellaneous (Signed)
Summary: triage  Clinical Lists Changes patient walks in office today again talking about pain medications. explained to patient that MD tried to call her yesterday. she states she does not know anything about getting a call from MD but she will go back home and check her messages. she states she currently is not being seen by Pain Management clinic. she has never been scheduled appointment she states.  ( RN does not see a referral, under orders. ) will ask MD to put in order and send to her team to get this scheduled. patient has appointment 09/05/2010 with Dr. Edmonia James. Theresia Lo RN  August 31, 2010 10:30 AM  Pt has been to pain management clinic before but stopped going b/c she said that they didn't help her.  I talked with her at her last office visit about going back to pain management since she is requesting escalating doses of narcotic type pain meds in the setting of possible overdose (see previous admission). she refused re-referral at that time. I called pt phone [phone]) to discuss further.  no answer.  no VM so unable to leave message. I will place the pain management referral today since it seems pt in now interested in going.  I spoke with pt in detail about the fact that I do not prescribe narcotics for chronic pain issues.  Ellin Mayhew MD  August 31, 2010 1:33 PM   Orders: Added new Referral order of Pain Clinic Referral (Pain) - Signed

## 2010-10-24 NOTE — Progress Notes (Signed)
Summary: [facility]  Phone Note Outgoing Call   Summary of Call: Called pt therapist at number provided by pt and left a message.  I am not sure that this was a correct number for her therapist but will wait and see if I here back from her next week.  I also tried to call [facility] to see who pt's psychiatrist is.  I was told that she sees multiple psychiatrist but most likely there is a primary nurse that has been taking care of her there.  I was told to call back and talk with Jimmi at [phone] on monday and she could most likely put me in contact with the individual who is providing psychaitric care for Ms. Span.  My goal in these conversations is to discuss what we can do as a care team to improve pt's level of functioning (ability to take coumadin correctly, deal with chronic pain, behave appropriately when talking on the phone and when in the office). Ellin Mayhew MD  July 01, 2010 9:51 AM   Follow-up for Phone Call        Attempt to call Jimmi at number given.  VM left. Ellin Mayhew MD  July 21, 2010 1:27 PM   Additional Follow-up for Phone Call Additional follow up Details #1::        attempt to call harriett long- new contact number for [facility] that pt gave me at her last appt.  I left a message. Ellin Mayhew MD  July 27, 2010 4:56 PM   harriett long called back and left VM.  Pt is taking Prozac 20mg  3 tablets daily.  She said that they in the past prescribed pt's klonopin but that had told them that her pcp was prescribing it so [facility] stopped writing for this.    pt is seen there every 2-3 months.  She has an appt coming up in Dec.   Can call to talk to Harriett long asneeded at [phone] Ellin Mayhew MD  August 06, 2010 4:40 PM

## 2010-10-24 NOTE — Miscellaneous (Signed)
Summary: re: Clonazepam and Tramadol/ts  Clinical Lists Changes [facility]  called to inform us, that pt received Clonazepam from Corie Chiquito, NP on 12-09-09 # 60. and Tramadol on 12-09-09 from Dr.Mayans. ok'd to fill Rx's, but no more refills before OV with PCP.Marland KitchenArlyss Repress CMA,  January 03, 2010 1:57 PM

## 2010-10-24 NOTE — Progress Notes (Signed)
Summary: phn msg  Phone Note Call from Patient Call back at Home Phone [phone]   Caller: Patient Summary of Call: pt wants to stop her coumidin because of not being able to get a shot for her back Initial call taken by: De Nurse,  June 05, 2010 3:42 PM  Follow-up for Phone Call        Spoke with pt at office visit today and discussed her concerns.  See office visit note. Ellin Mayhew MD  June 08, 2010 10:19 PM

## 2010-10-24 NOTE — Miscellaneous (Signed)
Summary: Hospital Admission R3 Addendum  CC:  Dyspnea  HPI:  For details, see excellent Admission H&P by Dr. Alvester Morin, R1.  Briefly, Ms. Zupko is a 70 yo female with 3 months of progressive fatigue, wheeze, and dypsnea.  She has cramping pain in the bilateral upper abdomen and lower chest.  She also notes a burning epigastric sternal pain today.  Her symptoms have been getting worse the past week, so she presented to the [facility] ED and was found to have PE on CT scan.  EKG at the time showed NSR at 83 bpm with no acute ischemic changes.  She denies hemoptysis, fevers.  She describes being evaluated recently for OSA and was recommended to be on CPAP, but has not had titration of CPAP yet.  ALL / MED / PMH / PSH / FH / SH:  Per Dr. [place] Blas note.  PE:  98.7   83  18   131/89  99% (2L)   GEN: Alert & oriented, weak appearing   NECK: Midline trachea, no masses/thyromegaly, no cervical lymphadenopathy   CARDIO: Regular rate and rhythm, no murmurs/rubs/gallops, 2+ bilateral radial pulses   RESP: Crackles RIGHT base, normal work of breathing, no retractions/accessory muscle use   ABD: Obese, normoactive bowel sounds, nontender   EXT: Mild tenderness bilateral, nodular posterior calf L>R, 2+ DP pulses bilateral  LABS/RADIOLOGY: BMet:  135 / 3.8 / 101 / 27 / 12 / 1.06 / 148, Ca 9.5 T-Bili 0.2, Alk Phos 99, AST 32, ALT 27, T-Prot 8.8, Alb 3.8 CBC:  10.8 / 11.9 / 36.2 / 238 Lipase 18 D-Dimer 2.41 U/A:  yellow, cloudy, sg 1.031, pH 7.5, gluc neg, bili neg, ket neg, blood neg, prot 30, urobil 1.0, nitrite neg, leuk small Urine Micro:  squam many, wbc 0-2, bacteria rare, mucous present CXR (2-view):  No infiltrate.  Chronic bronchitic changes. CT Angiogram Chest:  Large central bilateral pulmonary emboli.  Single questionable tiny nodular density right upper lobe, recommendations below.  If the patient is at high risk for bronchogenic carcinoma, follow-up chest CT at 1 year is recommended.  If the patient is  at low risk, no follow-up is needed.  A/P:  60 YOF w/ pulmonary embolism PULMONARY EMBOLISM.   Heparin drip per pharmacy.  Daily warfarin and INR checks until therapeutic (INR 2-3).  PMH significant for possible DVT in her 20's, though this may in fact have been varicose veins.  Given epigastric burning (noncardiac chest pain) will cycle cardiac enzymes. OSA.  Will arrange to have CPAP titrated while in the hospital. CHRONIC MEDICAL PROBLEMS.  Continue home meds per Dr. Kohler Blas note. GERD.  GI cocktail x1.  PPI daily. RUL NODULE.  Nonsmoker, so likely no need for followup. FEN/GI.  Heart healthy, low sodium diet.  Heplock IVF. PROPHYLAXIS.  Antiocoagulated. DISPOSITION.  Home when INR therapeutic x2 days and 5 days total heparin.

## 2010-10-24 NOTE — Progress Notes (Signed)
Summary: phn msg  Phone Note Call from Patient Call back at Home Phone [phone]   Caller: Patient Summary of Call: pt is wanting to talk to someone about changing pcp - doesn't feel she is getting her meds right Initial call taken by: De Nurse,  May 11, 2010 9:12 AM  Follow-up for Phone Call        Will route to Dr. Deirdre Priest. Follow-up by: Dennison Nancy RN,  May 11, 2010 2:24 PM  Additional Follow-up for Phone Call Additional follow up Details #1::        Spoke with patient.  she is very upset that all  doctors are" using computers to spy and me and treating me like it was slavery days  I will be up there tomorrow to get my blood check and I will get my medicines" (skin medicine prescribed by another doctor and Remus Loffler)   I tried to speak calmly with her and explain why we need to know what medications she takes and why we will not refill medications we did not prescribe.  She became more angy. I warned her that we ask out patients to behave civilly and that she might consider finding another practice if she was so unhappy with our care.  I explained that we would provide her care for 30 days if she decided to no longer come to our practice.   She ended that she would be here tomorrow and stay until we gave her her medicines.   Additional Follow-up by: Pearlean Brownie MD,  May 11, 2010 5:50 PM
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
## 2010-10-24 NOTE — Progress Notes (Signed)
  Phone Note Call from Patient   Caller: Patient Call For: [phone] Summary of Call: Mrs. [name] [name] is needing pain meds form her migraines.  She is out of herTrammadol, but her rx is not due for renewell untill the 26th.  She wanted to know if he can get some other meds to hold her over until she can renew her rx. Initial call taken by: Abundio Miu,  June 30, 2010 9:51 AM  Follow-up for Phone Call        will forward massage to MD. Follow-up by: Theresia Lo RN,  June 30, 2010 9:52 AM  Additional Follow-up for Phone Call Additional follow up Details #1::        I tried to call pt and did not get an answer.  I wrote enough tablets for her to have tramadol 3 tablets per day as needed.  The fact that she has ran out of tablets early most likely indicates that she has not taken these as directed.  I will not give her any other rx to get her through.  I have offered to put in a referral for pain management clinic.  She declined at that our last visit stating that they didn't help her the last time she when there.  Again, I would be glad to give her a referal for pain management at this time if she would like.  I would have preferred to talk to the patient about this myself but since it is Friday and this needs to be communicated back to the pt promptly I will ask office RN to help me with trying to get in contact with pt.  Again, there was no answer when I tried to call back.  Ellin Mayhew MD  June 30, 2010 1:36 PM     Additional Follow-up for Phone Call Additional follow up Details #2::    attempted call back no answer. Follow-up by: Theresia Lo RN,  June 30, 2010 4:44 PM  Additional Follow-up for Phone Call Additional follow up Details #3:: Details for Additional Follow-up Action Taken: patient is in office at this time for PT check and above message was given to patient by Loralee Pacas CMA. patient is also wanting refill on Ambien . states her neurologist  first gave  this to her  but she has talked with MD here about this also in the past. advised  patient that I will give message to MD but most likely she will need this RX to come from neurologist. patient again declines Pain Clinic referral. Additional Follow-up by: Theresia Lo RN,  June 30, 2010 4:53 PM  I do not want to start ambien at this time.  Would be glad to discuss this with pt at her next appt.  Ellin Mayhew MD  July 01, 2010 9:18 AM     attempted call back , no answer. Theresia Lo RN  July 03, 2010 10:01 AM

## 2010-10-24 NOTE — Assessment & Plan Note (Signed)
Summary: needs meds,tcb   Vital Signs:  Patient profile:   70 year old female Weight:      244.6 pounds Temp:     99.2 degrees F oral Pulse rate:   78 / minute BP sitting:   167 / 95  (left arm) Cuff size:   regular  Vitals Entered By: San Morelle, SMA CC: meds refill and f/u on back pain.   Primary Care Provider:  Ardeen Garland  MD  CC:  meds refill and f/u on back pain.Marland Kitchen  History of Present Illness: [name] [name] is here to discuss medication issues and follow-up on HTN, HLD and also discuss cramps 1) Meds issues - mix up.  Pharm thought she was requesting clonazepam and they called [place] and we had not prescribed it for her before and so she was asked to come in for a visit.  Mental Health prescribes teh clonazepam for her and she states what she actually wanted refilled was meclizine, which we do prescribe.  Mental health prescribes fluoxetine and clonazepam.  We prescribe the rest.  2) HTN - atenolol was increased at last visit.  She was asked to follow up in 2-4 weeks but did not.  BP is elevated today but states she has not taken her medicines.  States she was just "too worried".  When pressed about what, stated she is worried she won't be around to take care of her son.  Tolerates her meds when she takes them except does get muscle cramping in her legs. States she used to take potassium but hasn't in some time.  Labs in January were normal with a K of 4.0 3) HLD - Cholesterol high, LDL 142.  Would like at a minimum <130 given her HTN and age, but still ideally less than 100.  Not currently on a statin.  4) Cramps - See #2 5) Asthma - uses flovent and ventolin.  Never smoked.  FEels like her breathing has been worse the last few weeks.  Using her FLOVENT multiple times a day  -  2 puffs three times a day and her albuterol once or twice a day or not at all. Wheezing developing more easily when she is walking.  6) Sleep study - states she is having a sleep study done on Sunday.  Says it was  ordered by her cardiologist.  Showed me a paper from [facility] with an appt reminder for 12-7-0something (can't read it) with Dr. Jacinto Halim.  STates she has been there since then but not in the last year but that he ordered the sleep study.  States she is calling today to make an appt with him 7) Back pain - states she is calling today to make an appt with DR. Petra Kuba, her neurosurgeon 8) paps/mammo - likes to get from Dr. Okey Dupre.  States she make an appt but then didn't go  Habits & Providers  Alcohol-Tobacco-Diet     Tobacco Status: never  Current Medications (verified): 1)  Hydrocodone-Acetaminophen 5-500 Mg Tabs (Hydrocodone-Acetaminophen) .Marland Kitchen.. 1 Tab By Mouth Two Times A Day 2)  Atenolol 100 Mg Tabs (Atenolol) .Marland Kitchen.. 1 Tab By Mouth Daily 3)  Meclizine Hcl 25 Mg Tabs (Meclizine Hcl) .Marland Kitchen.. 1 Tab By Mouth Q 8 Hrs As Needed Dizziness 4)  Clonazepam 0.5 Mg Tabs (Clonazepam) .Marland Kitchen.. 1 Tab By Mouth Bid 5)  Zolpidem Tartrate 10 Mg Tabs (Zolpidem Tartrate) .Marland Kitchen.. 1 Tab By Mouth Qhs As Needed Sleep.  Needs Visit With Pcp Prior To More Refills. 6)  Hydrochlorothiazide 25 Mg Tabs (Hydrochlorothiazide) .Marland Kitchen.. 1 Tab By Mouth Daily For High Blood Pressure 7)  Fluoxetine Hcl 20 Mg Tabs (Fluoxetine Hcl) .... 3 Tabs By Mouth Qam 8)  Nexium 40 Mg Cpdr (Esomeprazole Magnesium) .Marland Kitchen.. 1 Tab By Mouth Daily For Reflux 9)  Flovent Hfa 220 Mcg/act Aero (Fluticasone Propionate  Hfa) .Marland Kitchen.. 1 Puff Inhaled Bid 10)  Ventolin Hfa 108 (90 Base) Mcg/act Aers (Albuterol Sulfate) .... 2 Puffs Q 4 Hrs As Needed Shortness of Breath or Wheezing. 11)  Potassium Chloride Cr 10 Meq Cr-Tabs (Potassium Chloride) .... 2 Tabs By Mouth Daily 12)  Simvastatin 40 Mg Tabs (Simvastatin) .Marland Kitchen.. 1 Tab By Mouth Qhs For High Cholesterol  Allergies: No Known Drug Allergies  Physical Exam  General:  obese, alert, NAD vitals reviewed Lungs:  normal WOB, CTAB, no wheezing or crackles Heart:  RRR without murmur Pulses:  2+ radial pulses Extremities:  no  edema   Impression & Recommendations:  Problem # 1:  ESSENTIAL HYPERTENSION, BENIGN (ICD-401.1) Assessment Unchanged  Since not taking meds, cannot adjust today.  Advised to TAKE HER MEDS EVERYDAY and return in 1 month for a recheck.  Her updated medication list for this problem includes:    Atenolol 100 Mg Tabs (Atenolol) .Marland Kitchen... 1 tab by mouth daily    Hydrochlorothiazide 25 Mg Tabs (Hydrochlorothiazide) .Marland Kitchen... 1 tab by mouth daily for high blood pressure  Orders: [facility]- Est  Level 4 (99214)  Problem # 2:  HYPERLIPIDEMIA (ICD-272.4) Assessment: New  Start Simvastatin.  Recheck direct LDL in 8 weeks.  Her updated medication list for this problem includes:    Simvastatin 40 Mg Tabs (Simvastatin) .Marland Kitchen... 1 tab by mouth qhs for high cholesterol  Orders: [facility]- Est  Level 4 (99214)  Problem # 3:  ASTHMA, PERSISTENT, MODERATE (ICD-493.90) Assessment: New  Advised of proper use of medication - floven 1 puff two times a day and albuterol as needed.  May be worse now due to allergy season.  Recheck status at next visit in 1 month.  May need to step up treatment depending on how often she needs albuterol when using meds properly.  Her updated medication list for this problem includes:    Flovent Hfa 220 Mcg/act Aero (Fluticasone propionate  hfa) .Marland Kitchen... 1 puff inhaled bid    Ventolin Hfa 108 (90 Base) Mcg/act Aers (Albuterol sulfate) .Marland Kitchen... 2 puffs q 4 hrs as needed shortness of breath or wheezing.  Orders: [facility]- Est  Level 4 (81191)  Problem # 4:  MUSCLE CRAMPS (ICD-729.82) Assessment: New  Will give low dose potassium.   Orders: [facility]- Est  Level 4 (47829)  Problem # 5:  Preventive Health Care (ICD-V70.0) Assessment: Comment Only Will Call Dr. Okey Dupre for pap and will schedule her mammo. Will call Dr. Jacinto Halim for appt and let us know if she has trouble (though unclear what she originally saw him for - poor historian) Needs colonscopyu but making too many appts currently.  Address at next  visit.   Complete Medication List: 1)  Hydrocodone-acetaminophen 5-500 Mg Tabs (Hydrocodone-acetaminophen) .Marland Kitchen.. 1 tab by mouth two times a day 2)  Atenolol 100 Mg Tabs (Atenolol) .Marland Kitchen.. 1 tab by mouth daily 3)  Meclizine Hcl 25 Mg Tabs (Meclizine hcl) .Marland Kitchen.. 1 tab by mouth q 8 hrs as needed dizziness 4)  Clonazepam 0.5 Mg Tabs (Clonazepam) .Marland Kitchen.. 1 tab by mouth bid 5)  Zolpidem Tartrate 10 Mg Tabs (Zolpidem tartrate) .Marland Kitchen.. 1 tab by mouth qhs as needed sleep.  needs visit  with pcp prior to more refills. 6)  Hydrochlorothiazide 25 Mg Tabs (Hydrochlorothiazide) .Marland Kitchen.. 1 tab by mouth daily for high blood pressure 7)  Fluoxetine Hcl 20 Mg Tabs (Fluoxetine hcl) .... 3 tabs by mouth qam 8)  Nexium 40 Mg Cpdr (Esomeprazole magnesium) .Marland Kitchen.. 1 tab by mouth daily for reflux 9)  Flovent Hfa 220 Mcg/act Aero (Fluticasone propionate  hfa) .Marland Kitchen.. 1 puff inhaled bid 10)  Ventolin Hfa 108 (90 Base) Mcg/act Aers (Albuterol sulfate) .... 2 puffs q 4 hrs as needed shortness of breath or wheezing. 11)  Potassium Chloride Cr 10 Meq Cr-tabs (Potassium chloride) .... 2 tabs by mouth daily 12)  Simvastatin 40 Mg Tabs (Simvastatin) .Marland Kitchen.. 1 tab by mouth qhs for high cholesterol  Patient Instructions: 1)  Be sure to take your blood pressure medications EVERYDAY! 2)  I have given you a new medicine for your high cholesterol.  It is called SIMVASTATIN.  Take it every night before bed. 3)  Please let me know who your cardiologist (heart doctor) is - if you are unable to schedule your own appointment with him, let me know and we will refer you. 4)  Use your flovent - orange inhaler - 1 puff in the morning and 1 puff at night only.  Use the ventolin (blue) whenever you have trouble breathing or coughing. 5)  Call Dr. Serita Kyle office for your pap as soon as possible and schedule your mammogram as soon as possible. 6)  Come back in 29month so I can see how your blood pressure is and how your cramps are doing.   Prescriptions: HYDROCHLOROTHIAZIDE 25 MG TABS (HYDROCHLOROTHIAZIDE) 1 tab by mouth daily for high blood pressure  #33 x 5   Entered and Authorized by:   Ardeen Garland  MD   Signed by:   Ardeen Garland  MD on 01/10/2010   Method used:   Print then Give to Patient   RxID:   [id number] MECLIZINE HCL 25 MG TABS (MECLIZINE HCL) 1 tab by mouth q 8 hrs as needed dizziness  #30 x 5   Entered and Authorized by:   Ardeen Garland  MD   Signed by:   Ardeen Garland  MD on 01/10/2010   Method used:   Print then Give to Patient   RxID:   [id number] ATENOLOL 100 MG TABS (ATENOLOL) 1 tab by mouth daily  #30 x 5   Entered and Authorized by:   Ardeen Garland  MD   Signed by:   Ardeen Garland  MD on 01/10/2010   Method used:   Print then Give to Patient   RxID:   [id number] SIMVASTATIN 40 MG TABS (SIMVASTATIN) 1 tab by mouth qHS for high cholesterol  #33 x 5   Entered and Authorized by:   Ardeen Garland  MD   Signed by:   Ardeen Garland  MD on 01/10/2010   Method used:   Print then Give to Patient   RxID:   [id number] POTASSIUM CHLORIDE CR 10 MEQ CR-TABS (POTASSIUM CHLORIDE) 2 tabs by mouth daily  #65 x 2   Entered and Authorized by:   Ardeen Garland  MD   Signed by:   Ardeen Garland  MD on 01/10/2010   Method used:   Print then Give to Patient   RxID:   [id number] HYDROCODONE-ACETAMINOPHEN 5-500 MG TABS (HYDROCODONE-ACETAMINOPHEN) 1 tab by mouth two times a day  #65 x 5   Entered and Authorized by:   Ardeen Garland  MD   Signed by:  Ardeen Garland  MD on 01/10/2010   Method used:   Print then Give to Patient   RxID:   [id number]   Prevention & Chronic Care Immunizations   Influenza vaccine: Not documented    Tetanus booster: Not documented    Pneumococcal vaccine: Not documented    H. zoster vaccine: Not documented  Colorectal Screening   Hemoccult: Not documented    Colonoscopy: Not documented  Other Screening   Pap smear: Not documented    Mammogram: Not  documented    DXA bone density scan: Not documented   Smoking status: never  (01/10/2010)  Lipids   Total Cholesterol: 244  (09/26/2009)   LDL: 142  (09/26/2009)   LDL Direct: Not documented   HDL: 84  (09/26/2009)   Triglycerides: 89  (09/26/2009)    SGOT (AST): 13  (09/26/2009)   SGPT (ALT): <8 U/L  (09/26/2009)   Alkaline phosphatase: 105  (09/26/2009)   Total bilirubin: 0.3  (09/26/2009)    Lipid flowsheet reviewed?: Yes   Progress toward LDL goal: Unchanged  Hypertension   Last Blood Pressure: 167 / 95  (01/10/2010)   Serum creatinine: 0.79  (09/26/2009)   Serum potassium 4.0  (09/26/2009)    Hypertension flowsheet reviewed?: Yes   Progress toward BP goal: Unchanged  Self-Management Support :   Personal Goals (by the next clinic visit) :      Personal blood pressure goal: 140/90  (01/10/2010)     Personal LDL goal: 130  (01/10/2010)    Hypertension self-management support: Not documented    Hypertension self-management support not done because: Not indicated  (01/10/2010)    Lipid self-management support: Not documented     Lipid self-management support not done because: Not indicated  (01/10/2010)

## 2010-10-24 NOTE — Miscellaneous (Signed)
Summary: walk in.   Clinical Lists Changes c/o low back pain on the R radiating to leg. she had a knee replacement on the R 1 yr ago. states this happens about twice a year. says it is a pinched nerve. has had a spinal fusion. went to [facility] ED saturday am. she was given valium & ibuprofen which she says is not helping. placed in Dr. Nicanor Alcon schedule now.[place] RN  November 14, 2009 9:29 AM

## 2010-10-24 NOTE — Progress Notes (Signed)
Summary: meds prob  Phone Note Call from Patient Call back at [phone]   Caller: Patient Summary of Call: pt states that she is out of her meds- rx's are in [facility] box...  Initial call taken by: De Nurse,  May 10, 2010 2:31 PM  Follow-up for Phone Call       Follow-up by: Golden Circle RN,  May 10, 2010 2:53 PM    Prescriptions: POTASSIUM CHLORIDE CR 10 MEQ CR-TABS (POTASSIUM CHLORIDE) 2 tabs by mouth daily  #64 x 3   Entered by:   Golden Circle RN   Authorized by:   Ellin Mayhew MD   Signed by:   Golden Circle RN on 05/10/2010   Method used:   Electronically to        [facility]* (retail)       [address]       [place], [place]  [id number]       Ph: [phone]       Fax: [phone]   RxID:   [id number]  denied the request for zolpidem. no longer on it. denied the celebrex. not in active or inactive med list..Sally [facility] RN  May 10, 2010 2:58 PM

## 2010-10-24 NOTE — Progress Notes (Signed)
Summary: refill  Phone Note Call from Patient Call back at Home Phone [phone]   Caller: Patient Summary of Call: needs cough meds with codeine in it.  didn't know she had appt today - resch to next Monday [facility]  Initial call taken by: De Nurse,  Jan 23, 2010 3:27 PM  Follow-up for Phone Call        printed and placed in to be faxed box.   Additional Follow-up for Phone Call Additional follow up Details #1::        informed pt Additional Follow-up by: De Nurse,  Jan 25, 2010 9:31 AM    New/Updated Medications: ROBAFEN AC 100-10 MG/5ML SYRP (GUAIFENESIN-CODEINE) 1-2 tsp by mouth q 8 hrs as needed cough disp: 60mL Prescriptions: ROBAFEN AC 100-10 MG/5ML SYRP (GUAIFENESIN-CODEINE) 1-2 tsp by mouth q 8 hrs as needed cough disp: 60mL  #1 x 0   Entered and Authorized by:   Ardeen Garland  MD   Signed by:   Ardeen Garland  MD on 01/25/2010   Method used:   Printed then faxed to ...       [facility]* (retail)       [address]       [place], [place]  [id number]       Ph: [phone]       Fax: [phone]   RxID:   [id number]

## 2010-10-24 NOTE — Miscellaneous (Signed)
Summary: refused [facility] help  Clinical Lists Changes rec'd message from Dewayne Hatch, a nurse with [facility]. pt has refused their services. I called pt & she does want help. it was just that when they wanted to open the case. she was busy with her son in the hospital & could not commit to a date. I called [facility] back to see if they could try again. she will call the pt back & see if she will allow them to come back out. she had missed her hosp f/u appt due to son but has another on the 10th.Golden Circle RN  Jan 24, 2010 10:47 AM   Ardeen Garland  MD  Jan 24, 2010 4:09 PM

## 2010-10-24 NOTE — Progress Notes (Signed)
Summary: triage  Phone Note Call from Patient Call back at Home Phone [phone]   Caller: Patient Summary of Call: Pt was given a muscle relaxer that she already had before and it makes her feel bad.   Initial call taken by: Clydell Hakim,  May 03, 2010 3:14 PM  Follow-up for Phone Call        she did not pick up the flexeril since she has had it before & it makes her feel bad. told her I will ask her md to add this to her allergy list as we did not know this.  states it makes her arms hurt & makes her feel bad.  has appt at [facility] on the 16th for her back pain.  wanted an injection but is on coumadin.   states she never had these problems in [place]. she is upset with the doctors "down here " &  the hospitals.  feels she is being sent to too many mds & she only has 25 visits per year & then she has to pay? I was not able to follow her train of thought. she was upset about medicaid?  she has a f/u with Dr. Edmonia James tomorrow at 2:30 Follow-up by: Golden Circle RN,  May 03, 2010 3:25 PM

## 2010-10-24 NOTE — Assessment & Plan Note (Signed)
Summary: back pain/[place]/caviness   Vital Signs:  Patient profile:   70 year old female Weight:      240.3 pounds Temp:     97.9 degrees F oral Pulse rate:   64 / minute BP sitting:   125 / 81  (left arm) Cuff size:   large  Vitals Entered By: Garen Grams LPN (May 02, 2010 3:56 PM) CC: back pain that radiates down legs Is Patient Diabetic? No Pain Assessment Patient in pain? yes     Location: back Intensity: 10   Primary Care Provider:  Ellin Mayhew MD  CC:  back pain that radiates down legs.  History of Present Illness: 70 yo female here for work in appt; hx of chronic back pain, currently c/o acutely worsening back pain.  Is established with ortho doctor and neurologist.  Gets epudirals every few months due to lumbar back pain.  Feels that pain is coming from her hip today.  No loss of bowel or bladder.  No numbness or tingling.  No new weakness or loss of stregnth.  Went to ED over the weekend and was given valium for muscle relaxation.    Habits & Providers  Alcohol-Tobacco-Diet     Tobacco Status: never  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) 4)  Muscle Cramps  (ICD-729.82) 5)  Hyperlipidemia  (ICD-272.4) 6)  Essential Hypertension, Benign  (ICD-401.1) 7)  Obesity, Unspecified  (ICD-278.00) 8)  Depression  (ICD-311) 9)  Schizophrenia  (ICD-295.90) 10)  Other Chronic Pain  (ICD-338.29)  Medications Prior to Update: 1)  Atenolol 100 Mg Tabs (Atenolol) .Marland Kitchen.. 1 Tab By Mouth Daily 2)  Meclizine Hcl 25 Mg Tabs (Meclizine Hcl) .Marland Kitchen.. 1 Tab By Mouth Q 8 Hrs As Needed Dizziness 3)  Clonazepam 0.5 Mg Tabs (Clonazepam) .Marland Kitchen.. 1 Tab By Mouth Bid 4)  Hydrochlorothiazide 25 Mg Tabs (Hydrochlorothiazide) .Marland Kitchen.. 1 Tab By Mouth Daily For High Blood Pressure 5)  Fluoxetine Hcl 20 Mg Tabs (Fluoxetine Hcl) .... 2 Tabs By Mouth Qam 6)  Nexium 40 Mg Cpdr (Esomeprazole Magnesium)  .Marland Kitchen.. 1 Tab By Mouth Daily For Reflux 7)  Flovent Hfa 220 Mcg/act Aero (Fluticasone Propionate  Hfa) .Marland Kitchen.. 1 Puff Inhaled Bid 8)  Ventolin Hfa 108 (90 Base) Mcg/act Aers (Albuterol Sulfate) .... 2 Puffs Q 4 Hrs As Needed Shortness of Breath or Wheezing. 9)  Potassium Chloride Cr 10 Meq Cr-Tabs (Potassium Chloride) .... 2 Tabs By Mouth Daily 10)  Simvastatin 40 Mg Tabs (Simvastatin) .Marland Kitchen.. 1 Tab By Mouth Qhs For High Cholesterol 11)  Tramadol Hcl 50 Mg Tabs (Tramadol Hcl) .Marland Kitchen.. 1 Tab By Mouth Three Times A Day As Needed For Pain. 12)  Coumadin 5 Mg Tabs (Warfarin Sodium) .... Take As Directed  Allergies (verified): No Known Drug Allergies  Review of Systems  The patient denies weight loss, incontinence, and difficulty walking.    Physical Exam  General:  VS reviewed, well-developed, well-nourished, and well-hydrated.   Lungs:  Normal respiratory effort, chest expands symmetrically. Heart:  Normal rate and regular rhythm.  Msk:  limited ROM at spine 2/2 pain in flexion, extension, rotation.  nontender to palpation spine midline.  no SI joint pain.  + equivocal right trochanter pain to palpation.  Negative straight leg raise bilaterally.  left hip no pain with int/ext rotation.  right hip pain in groin with internal rotation.   Psych:  poor eye contact, labile affect,  easily distracted, and judgment fair.     Impression & Recommendations:  Problem # 1:  OTHER CHRONIC PAIN (ICD-338.29) Assessment Unchanged Chronic back pain, sees ortho, gets monthly epidural shots.  No red flag s/s.  Pt very labile during visit.  Recently admitted to [facility] due to intentional overdose of vicodin per pt report, but likely coumadin and tramamdol as well.  Pt requesting script for dilaudid.  Given recent SI attempt, I do not think narcptics is reasonable for this patient.  Advised pt to continue seeing ortho as before.  Given exercises for flexion and extension.  Ice daily.  Flexeril for night.  Follow up with PCP, pt  has visit scheduled for friday.  Orders: [facility]- Est Level  3 (23557)  Complete Medication List: 1)  Atenolol 100 Mg Tabs (Atenolol) .Marland Kitchen.. 1 tab by mouth daily 2)  Meclizine Hcl 25 Mg Tabs (Meclizine hcl) .Marland Kitchen.. 1 tab by mouth q 8 hrs as needed dizziness 3)  Clonazepam 0.5 Mg Tabs (Clonazepam) .Marland Kitchen.. 1 tab by mouth bid 4)  Hydrochlorothiazide 25 Mg Tabs (Hydrochlorothiazide) .Marland Kitchen.. 1 tab by mouth daily for high blood pressure 5)  Fluoxetine Hcl 20 Mg Tabs (Fluoxetine hcl) .... 2 tabs by mouth qam 6)  Nexium 40 Mg Cpdr (Esomeprazole magnesium) .Marland Kitchen.. 1 tab by mouth daily for reflux 7)  Flovent Hfa 220 Mcg/act Aero (Fluticasone propionate  hfa) .Marland Kitchen.. 1 puff inhaled bid 8)  Ventolin Hfa 108 (90 Base) Mcg/act Aers (Albuterol sulfate) .... 2 puffs q 4 hrs as needed shortness of breath or wheezing. 9)  Potassium Chloride Cr 10 Meq Cr-tabs (Potassium chloride) .... 2 tabs by mouth daily 10)  Simvastatin 40 Mg Tabs (Simvastatin) .Marland Kitchen.. 1 tab by mouth qhs for high cholesterol 11)  Tramadol Hcl 50 Mg Tabs (Tramadol hcl) .Marland Kitchen.. 1 tab by mouth three times a day as needed for pain. 12)  Coumadin 5 Mg Tabs (Warfarin sodium) .... Take as directed 13)  Flexeril 10 Mg Tabs (Cyclobenzaprine hcl) .... Take 1 pill by mouth at bedtime as needed pain  Patient Instructions: 1)  Do the exercises we discussed daily. 2)  Ice daily.   3)  Try taking Flexeril for night.  ONLY take 1 pill at a time.  4)  Stop taking the valium that you got at the emergency room.  5)   Follow up with Dr. Edmonia James as scheduled on Friday. Prescriptions: FLEXERIL 10 MG TABS (CYCLOBENZAPRINE HCL) take 1 pill by mouth at bedtime as needed pain  #30 x 0   Entered and Authorized by:   Alvia Grove DO   Signed by:   Alvia Grove DO on 05/02/2010   Method used:   Electronically to        [facility]* (retail)       [address]       [place], [place]  [id number]       Ph: [phone]       Fax: [phone]   RxID:    [id number]

## 2010-10-24 NOTE — Progress Notes (Signed)
Summary: Tramadol  Phone Note Call from Patient   Summary of Call: pt in office stated that for her to get her Tramadol refill we needed to call [facility].  Spoke with pharm at [facility] she recieved a 30 day supply on 03/11/10 #90 pills, he advised pt that he would give her a refill on the 15th since the 18 was a Sunday or she would have to talk with her PCP, I advised her she should have enough to get through the 18th from her last rx and that I would send you a note and only you could auth it being filled sooner, stated with her knee and neck pain she needed it. Initial call taken by: Gladstone Pih,  April 05, 2010 5:08 PM  Follow-up for Phone Call        Spoke with pt on phone.  Explained the importance of taking all medications as directed.  She agreed that she will go to pick up tramadol tomorrow as previously prescribed and will take as directed. Ellin Mayhew MD  April 06, 2010 11:07 AM

## 2010-10-24 NOTE — Progress Notes (Signed)
Summary: triage  Phone Note Call from Patient Call back at Home Phone [phone]   Caller: Patient Summary of Call: Pt really dizzy. Initial call taken by: Clydell Hakim,  April 14, 2010 1:55 PM  Follow-up for Phone Call        called pt, she just wanted to let you know she was really dizzy and she took her Meclizine whiched helped, stated she was staying inside out of the heat and drinking fluids, declined need to come in to be seen, advised to call if she feels she needs an apt, to PCP Follow-up by: Gladstone Pih,  April 14, 2010 2:06 PM

## 2010-10-24 NOTE — Assessment & Plan Note (Signed)
Summary: NP,tcb   Vital Signs:  Patient profile:   70 year old female Weight:      240 pounds Temp:     98.2 degrees F oral Pulse rate:   62 / minute BP sitting:   160 / 82  (right arm) Cuff size:   large  Vitals Entered By: Tessie Fass CMA (September 26, 2009 10:14 AM)  Serial Vital Signs/Assessments:  Time      Position  BP       Pulse  Resp  Temp     By 10:45AM             168/92                         Ardeen Garland  MD  CC: body ache Is Patient Diabetic? No Pain Assessment Patient in pain? yes     Location: body aches Intensity: 10   CC:  body ache.  History of Present Illness: Ms. Mand comes in today as a new patient to our practice.  [facility] and so is switching doctors.  She last saw them in September 2010.  She brings all her medications with her today.  Please see PMH, FH, SH for more details.  Today she complains of pain all over.  Also states no one has ever talked to her about her blood pressure or cholesterol. 1) Pain - has arthritis and chronic pain.  Has had a pin put in her left arm following a fracture.  Has had a spinal fusion.  Had a right total knee replacement as well.  Takes tramadol which helps a lot but she ran out over a week ago. 2) BP - on BP meds but doesn't know which meds are for her blood pressure.  Doesn't know what it should be.  Part of why she wasn't happy at previous practice. 3) Cholesterol - she thinks her cholesterol has been checked before but not in awhile but no one ever sent her or told her the results.  Not on cholesterol medication.   No other complaints today.  States she has her paps done at [facility] by Dr. Okey Dupre.  Also gets her mammograms at [facility].  Though admits due to dealing with her son's poor health, she has not had either in about 5 years. Had colonscopy approx 10 years ago in [place].  Normal per patient report.   Habits &  Providers  Alcohol-Tobacco-Diet     Tobacco Status: never  Current Medications (verified): 1)  Tramadol Hcl 50 Mg Tabs (Tramadol Hcl) .Marland Kitchen.. 1 Tab By Mouth Three Times A Day As Needed Pain 2)  Atenolol 100 Mg Tabs (Atenolol) .Marland Kitchen.. 1 Tab By Mouth Daily 3)  Meclizine Hcl 25 Mg Tabs (Meclizine Hcl) .Marland Kitchen.. 1 Tab By Mouth Q 8 Hrs As Needed Dizziness 4)  Clonazepam 0.5 Mg Tabs (Clonazepam) .Marland Kitchen.. 1 Tab By Mouth Bid 5)  Zolpidem Tartrate 10 Mg Tabs (Zolpidem Tartrate) .Marland Kitchen.. 1 Tab By Mouth Qhs As Needed Sleep 6)  Hydrochlorothiazide 25 Mg Tabs (Hydrochlorothiazide) .Marland Kitchen.. 1 Tab By Mouth Daily For High Blood Pressure 7)  Fluoxetine Hcl 20 Mg Tabs (Fluoxetine Hcl) .... 3 Tabs By Mouth Qam 8)  Nexium 40 Mg Cpdr (Esomeprazole Magnesium) .Marland Kitchen.. 1 Tab By Mouth Daily For Reflux 9)  Flovent Hfa 220 Mcg/act Aero (Fluticasone Propionate  Hfa) .Marland Kitchen.. 1 Puff Inhaled Bid 10)  Ventolin Hfa 108 (90 Base) Mcg/act Aers (Albuterol Sulfate) .... 2 Puffs Q 4 Hrs As Needed Shortness of Breath or Wheezing.  Past History:  Past Medical History: Schizophrenia - follows with mental health and sees therapist - Dr. 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  Has 2 other children.  [name] is mentally handicapped and has a severe seizure disorder.  SHe is primary caretaker for him.  No smoking, alcohol, drugs.  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  Never worked outside the home. Smoking Status:  never  Physical Exam  General:  obese, alert, NAD, vitals reviewed and rechecked Eyes:  conjunctiva clear, no injection Mouth:  oropharynx clear Lungs:  Normal respiratory effort, chest expands symmetrically. Lungs are clear to auscultation, no  crackles or wheezes. Heart:  Normal rate and regular rhythm. S1 and S2 normal without gallop, murmur, click, rub or other extra sounds. Abdomen:  Bowel sounds positive,abdomen soft and non-tender without masses, organomegaly or hernias noted. Pulses:  2+ radial and dp pulses Extremities:  no edema Skin:  no rashes Psych:  Oriented X3, memory intact for recent and remote, good eye contact, and not anxious appearing.  Very verbose   Impression & Recommendations:  Problem # 1:  ESSENTIAL HYPERTENSION, BENIGN (ICD-401.1) Assessment New Not at goal.  Increase atenolol.  If that does not control, will try different medication.  Return in 2-4 weeks for a recheck.  CHeck CMET today for electrolytes, creatinine..  Continue HCTZ Her updated medication list for this problem includes:    Atenolol 100 Mg Tabs (Atenolol) .Marland Kitchen... 1 tab by mouth daily    Hydrochlorothiazide 25 Mg Tabs (Hydrochlorothiazide) .Marland Kitchen... 1 tab by mouth daily for high blood pressure  Orders: Comp Met-[facility] [id number]) Lipid-[facility] ([id number])  Problem # 2:  OTHER CHRONIC PAIN (ICD-338.29) Assessment: New Restart tramadol  Problem # 3:  SCHIZOPHRENIA (ICD-295.90) Assessment: New Per mental health  Problem # 4:  DEPRESSION (ICD-311) Assessment: New  ON prozac through mental health.   Her updated medication list for this problem includes:    Clonazepam 0.5 Mg Tabs (Clonazepam) .Marland Kitchen... 1 tab by mouth bid    Fluoxetine Hcl 20 Mg Tabs (Fluoxetine hcl) .Marland KitchenMarland KitchenMarland KitchenMarland Kitchen 3 tabs by mouth qam  Problem # 5:  Preventive Health Care (ICD-V70.0) Assessment: Comment Only Advised we can to her paps here but she is fond of Dr. Okey Dupre at [facility] and wishes to contnue there.  Advised to schedule that appt ASAP.  Also advised to schedule mammo at her earliest convenicne. Refer for colocnoscopy at next appt.   Complete Medication List: 1)  Tramadol Hcl 50 Mg Tabs (Tramadol hcl) .Marland Kitchen.. 1 tab by mouth three times a day as needed pain 2)  Atenolol  100 Mg Tabs (Atenolol) .Marland Kitchen.. 1 tab by mouth daily 3)  Meclizine Hcl 25 Mg Tabs (Meclizine hcl) .Marland Kitchen.. 1 tab by mouth q 8 hrs as needed dizziness 4)  Clonazepam 0.5 Mg Tabs (Clonazepam) .Marland Kitchen.. 1 tab by mouth bid 5)  Zolpidem Tartrate 10 Mg Tabs (Zolpidem tartrate) .Marland Kitchen.. 1 tab by mouth qhs as needed sleep 6)  Hydrochlorothiazide 25 Mg Tabs (Hydrochlorothiazide) .Marland Kitchen.. 1 tab by mouth daily for high blood pressure 7)  Fluoxetine Hcl 20 Mg Tabs (Fluoxetine hcl) .Marland KitchenMarland KitchenMarland Kitchen  3 tabs by mouth qam 8)  Nexium 40 Mg Cpdr (Esomeprazole magnesium) .Marland Kitchen.. 1 tab by mouth daily for reflux 9)  Flovent Hfa 220 Mcg/act Aero (Fluticasone propionate  hfa) .Marland Kitchen.. 1 puff inhaled bid 10)  Ventolin Hfa 108 (90 Base) Mcg/act Aers (Albuterol sulfate) .... 2 puffs q 4 hrs as needed shortness of breath or wheezing.  Patient Instructions: 1)  I have increased your atenolol from 50 to 100 mg daily.  You have 50 mg tabs now.  YOu can take 2 tabs every morning until you run out and then start the new prescription for 100 mg tabs.  Once you start the 100 mg tabs, you will just take 1 tab a day. 2)  Restart your tramadol for you pain. 3)  Please return in 2-4 weeks to check your blood pressure again and discuss your labwork.  4)  Please call to schedule your mammogram at your earliest convenience. 5)  Please call Dr. Okey Dupre to schedule your pap smear at your earliest convenience.  Ask if they can send the result to [place] as well - Dr. Georgiana Shore at [facility].  6)  It was very nice to meet you today.  I will see you in a few weeks.  Prescriptions: ATENOLOL 100 MG TABS (ATENOLOL) 1 tab by mouth daily  #30 x 5   Entered and Authorized by:   Ardeen Garland  MD   Signed by:   Ardeen Garland  MD on 09/26/2009   Method used:   Print then Give to Patient   RxID:   [id number] TRAMADOL HCL 50 MG TABS (TRAMADOL HCL) 1 tab by mouth three times a day as needed pain  #90 x 5   Entered and Authorized by:   Ardeen Garland  MD   Signed by:    Ardeen Garland  MD on 09/26/2009   Method used:   Print then Give to Patient   RxID:   [id number]

## 2010-10-24 NOTE — Assessment & Plan Note (Signed)
Summary: hosp f/up   Vital Signs:  Patient profile:   70 year old female Weight:      236.9 pounds Temp:     98.5 degrees F oral Pulse rate:   77 / minute BP sitting:   141 / 83  (left arm)  Vitals Entered By: Gladstone Pih (April 03, 2010 3:39 PM) CC: hospital follow up Is Patient Diabetic? No Pain Assessment Patient in pain? no        Primary Care Provider:  Ardeen Garland  MD  CC:  hospital follow up.  History of Present Illness: overdose: Pt was discharged from hospital [facility] this month after a possible overdose.  Pt states that she just wanted to sleep that is why she took the vicodin that led up to her admission.  She denies taking extra coumadin.  She denies that this was a suicide attempt.  She requests that vidocin be removed from her med list today at the appt.  She states that she is scared of those medicines now.  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  Pt told this today.  Pt states that she has not taken her coumadin in about 2 weeks.  We talked about the importance of staying on this medicine.    Mental health- Pt has an appt on 7/25 with harriett [facility] at [facility]  She states that [facility] will be prescribing her antidepressent/anxiety medications.  therefore I did not refill prozac or benzo.  Pt provider's name is harriett Long.  Pt therapit is out of [facility].  -  Dr. Delane Ginger  Habits & Providers  Alcohol-Tobacco-Diet     Tobacco Status: never  Current Medications (verified): 1)  Atenolol 100 Mg Tabs (Atenolol) .Marland Kitchen.. 1 Tab By Mouth Daily 2)  Meclizine Hcl 25 Mg Tabs (Meclizine Hcl) .Marland Kitchen.. 1 Tab By Mouth Q 8 Hrs As Needed Dizziness 3)  Clonazepam 0.5 Mg Tabs (Clonazepam) .Marland Kitchen.. 1 Tab By Mouth Bid 4)  Hydrochlorothiazide 25 Mg Tabs (Hydrochlorothiazide) .Marland Kitchen.. 1 Tab By Mouth  Daily For High Blood Pressure 5)  Fluoxetine Hcl 20 Mg Tabs (Fluoxetine Hcl) .... 2 Tabs By Mouth Qam 6)  Nexium 40 Mg Cpdr (Esomeprazole Magnesium) .Marland Kitchen.. 1 Tab By Mouth Daily For Reflux 7)  Flovent Hfa 220 Mcg/act Aero (Fluticasone Propionate  Hfa) .Marland Kitchen.. 1 Puff Inhaled Bid 8)  Ventolin Hfa 108 (90 Base) Mcg/act Aers (Albuterol Sulfate) .... 2 Puffs Q 4 Hrs As Needed Shortness of Breath or Wheezing. 9)  Potassium Chloride Cr 10 Meq Cr-Tabs (Potassium Chloride) .... 2 Tabs By Mouth Daily 10)  Simvastatin 40 Mg Tabs (Simvastatin) .Marland Kitchen.. 1 Tab By Mouth Qhs For High Cholesterol 11)  Tramadol Hcl 50 Mg Tabs (Tramadol Hcl) .Marland Kitchen.. 1 Tab By Mouth Three Times A Day As Needed For Pain. 12)  Coumadin 5 Mg Tabs (Warfarin Sodium) .... Take As Directed  Allergies (verified): No Known Drug Allergies  Review of Systems       see hpi  Physical Exam  General:  VSS, obesen no acute distress; alert,appropriate and cooperative throughout examination Lungs:  Normal respiratory effort, chest expands symmetrically. Heart:  Normal rate and regular rhythm.  Pulses:  2+ Extremities:  trace edema Skin:  no rashe Psych:  Pt denies SI and HI.  Pt is A/O x 3.    Impression &  Recommendations:  Problem # 1:  PULMONARY EMBOLISM (ICD-415.19) Pt restarted on coumadin today.  Pt is to take 5 mg daily until coumadin level rechecked in 3 days.  At that time will reevaluate needed dose.   Pt aware that she may coumadin therapy for life    Her updated medication list for this problem includes:    Coumadin 5 Mg Tabs (Warfarin sodium) .Marland Kitchen... Take as directed  Orders: [facility]- Est  Level 4 ([address])  Problem # 2:  HYPERLIPIDEMIA (ICD-272.4) refill given for simvastatin.  If pt takes consistently may recheck in 3 months.    Her updated medication list for this problem includes:    Simvastatin 40 Mg Tabs (Simvastatin) .Marland Kitchen... 1 tab by mouth qhs for high cholesterol  Orders: [facility]- Est  Level 4 (99214)  Problem # 3:  ESSENTIAL  HYPERTENSION, BENIGN (ICD-401.1) BP 141/83 today.  Will monitor closely and change treatment regimen as needed to control hypertension.    Her updated medication list for this problem includes:    Atenolol 100 Mg Tabs (Atenolol) .Marland Kitchen... 1 tab by mouth daily    Hydrochlorothiazide 25 Mg Tabs (Hydrochlorothiazide) .Marland Kitchen... 1 tab by mouth daily for high blood pressure  Complete Medication List: 1)  Atenolol 100 Mg Tabs (Atenolol) .Marland Kitchen.. 1 tab by mouth daily 2)  Meclizine Hcl 25 Mg Tabs (Meclizine hcl) .Marland Kitchen.. 1 tab by mouth q 8 hrs as needed dizziness 3)  Clonazepam 0.5 Mg Tabs (Clonazepam) .Marland Kitchen.. 1 tab by mouth bid 4)  Hydrochlorothiazide 25 Mg Tabs (Hydrochlorothiazide) .Marland Kitchen.. 1 tab by mouth daily for high blood pressure 5)  Fluoxetine Hcl 20 Mg Tabs (Fluoxetine hcl) .... 2 tabs by mouth qam 6)  Nexium 40 Mg Cpdr (Esomeprazole magnesium) .Marland Kitchen.. 1 tab by mouth daily for reflux 7)  Flovent Hfa 220 Mcg/act Aero (Fluticasone propionate  hfa) .Marland Kitchen.. 1 puff inhaled bid 8)  Ventolin Hfa 108 (90 Base) Mcg/act Aers (Albuterol sulfate) .... 2 puffs q 4 hrs as needed shortness of breath or wheezing. 9)  Potassium Chloride Cr 10 Meq Cr-tabs (Potassium chloride) .... 2 tabs by mouth daily 10)  Simvastatin 40 Mg Tabs (Simvastatin) .Marland Kitchen.. 1 tab by mouth qhs for high cholesterol 11)  Tramadol Hcl 50 Mg Tabs (Tramadol hcl) .Marland Kitchen.. 1 tab by mouth three times a day as needed for pain. 12)  Coumadin 5 Mg Tabs (Warfarin sodium) .... Take as directed  Patient Instructions: 1)  Take coumadin 5mg  tablet- 1tablet per day.  Return for coumadin check in 3 days. 2)  Make a follow up appt with me within 1 month.   3)  Restart simvastatin the prescription has been sent to the pharmacy this is for your high cholesterol. Prescriptions: SIMVASTATIN 40 MG TABS (SIMVASTATIN) 1 tab by mouth qHS for high cholesterol  #30 x 5   Entered and Authorized by:   Ellin Mayhew MD   Signed by:   Ellin Mayhew MD on 04/03/2010   Method used:    Electronically to        [facility] (retail)       [address]       [place], [place]  [id number]       Ph: [phone]       Fax: [phone]   RxID:   [id number] COUMADIN 5 MG TABS (WARFARIN SODIUM) take as directed  #30 x 3   Entered and Authorized by:   Ellin Mayhew MD   Signed by:   Ellin Mayhew MD on 04/03/2010  Method used:   Electronically to        [facility]* (retail)       [address]       [place], [place]  [id number]       Ph: [phone]       Fax: [phone]   RxID:   [id number]

## 2010-10-24 NOTE — Progress Notes (Signed)
Summary: Ref Req  Phone Note Call from Patient Call back at [facility] Phone [phone] Call back at [phone]   Caller: Patient Summary of Call: Pt needs referral to [facility].  For torn ligament in left arm.  She has an appt on Monday, but due to ins needs the referral from [place]. Initial call taken by: Clydell Hakim,  December 07, 2009 1:36 PM  Follow-up for Phone Call        patient states she has been going to [facility] for 2-3 years. she needs surgery on her arm. she missed an appointment and now they are telling her she needs a new referral form PCP to continue being seen. will forward to MD. Follow-up by: Theresia Lo RN,  December 07, 2009 2:38 PM  Additional Follow-up for Phone Call Additional follow up Details #1::        Completed.  Additional Follow-up by: Lamar Laundry, MD March 17th, 2011 08:45AM  New Problems: SHOULDER PAIN (ICD-719.41)   New Problems: SHOULDER PAIN (ICD-719.41)  Appended Document: Ref Req called [facility] and was told that she does not need a new referral from [place]. message left on voicemail for patient regarding this.

## 2010-10-24 NOTE — Miscellaneous (Signed)
Summary: coumadin change?  Clinical Lists Changes [name] [name] with [phone])  got the pt/inr today. PT 20.6 INR 2.9. she currently has 5mg  tabs & takes 1.5 tabs daily. pt needs to know if there is a dose change. her number is [phone]. pcp not here. given to preceptor.Golden Circle RN  Jan 27, 2010 3:03 PM  per Doctor McDiarmid, keep dose same & recheck in 2 wks. LM at pt home & called the nurse back. told her I was unable to reach pt but left a message that she was not to change dose. to call back for any questions. Arline Asp will return in 2 weeks to recheck her.Golden Circle RN  Jan 27, 2010 3:09 PM

## 2010-10-24 NOTE — Progress Notes (Signed)
Summary: refill  Phone Note Refill Request Call back at Home Phone [phone] Message from:  Patient  Refills Requested: Medication #1:  ATENOLOL 100 MG TABS 1 tab by mouth daily pt is out  Initial call taken by: De Nurse,  August 24, 2010 2:21 PM  Follow-up for Phone Call        patient came to office requesting refill on  atenolol. sent  electronically to pharmacy. will ask MD if she would like to give more refills . Theresia Lo RN  August 24, 2010 3:14 PM  Follow-up by: Theresia Lo RN,  August 24, 2010 3:14 PM    Prescriptions: ATENOLOL 100 MG TABS (ATENOLOL) 1 tab by mouth daily  #30 x 6   Entered and Authorized by:   Ellin Mayhew MD   Signed by:   Ellin Mayhew MD on 08/28/2010   Method used:   Electronically to        [facility] (retail)       [address]       [place], [place]  [address]       Ph: [phone]       Fax: [phone]   RxID:   [id number]

## 2010-10-24 NOTE — Progress Notes (Signed)
Summary: phn msg  Phone Note Call from Patient   Caller: Patient Summary of Call: pt is in the hosp w/ a PE and she called to complain about her not getting good care.  she states that she is in pain and that the doctors are not listening to her or helping her.  She wanted to talk to her doctor.  she also stated that she will probably go over to [facility] after she gets out of this hospital.  Wasn't sure who to route this to. Initial call taken by: De Nurse,  January 19, 2010 9:03 AM  Follow-up for Phone Call        Micah Flesher to see patient in the hospital.  Biggest complaint is she didn't understand why she was hurting - didn't realize a PE could cause this kind of pain - and that the coumadin didn't fix it overnight.  Discussed this with her and explained it until she was satisfied.  Discussed I have to see patients in the office and so cannot be her primary doctor who sees her everyday while she is in the hospital.  The inpatient team are all my  partners and will take good care of her.  Seemed fine with this and was happier at end of visit.  Adjusted pain regimen for her as well and relayed this to her inpatient physician.  Follow-up by: Lamar Laundry, MD April 28th, 2011 09:50

## 2010-10-24 NOTE — Assessment & Plan Note (Signed)
Summary: h/fup,tcb   Vital Signs:  Patient profile:   70 year old female Weight:      242 pounds Pulse rate:   65 / minute BP sitting:   119 / 79  (right arm) Cuff size:   large  Vitals Entered By: Arlyss Repress CMA, (Jan 30, 2010 11:18 AM) CC: hospital f/up. refill Ativan Is Patient Diabetic? No Pain Assessment Patient in pain? no        Primary Care Provider:  Ardeen Garland  MD  CC:  hospital f/up. refill Ativan.  History of Present Illness: [name] [name] comes in for hospital follow-up from a PE. She was started on coumadin.  She is a relatively new patient to our clinic, but states she had a DVT in the past.  Therefore, she is a candidate for lifelong coumadin, as long as she has no major bleeding events from it.  She is tolerating the coumadin.  No bleeding.  Her pain is continuing to improve. States she is using very little pain meds.  Breathign well.  Overall feeling very good.  SHe has no complaints today.  SHe is taking 7.5mg  of coumadin daily.  Her INR today is 3.5.  Habits & Providers  Alcohol-Tobacco-Diet     Tobacco Status: never  Allergies: No Known Drug Allergies  Physical Exam  General:  obese, alert, NAD vitals reviewed Lungs:  Normal respiratory effort, chest expands symmetrically. Lungs are clear to auscultation, no crackles or wheezes. Heart:  Normal rate and regular rhythm. S1 and S2 normal without gallop, murmur, click, rub or other extra sounds. Skin:  no brusing   Impression & Recommendations:  Problem # 1:  PULMONARY EMBOLISM (ICD-415.19) Assessment New Stable from respiratory and pain standpoint.  Lifelong coumadin as had prior DVT in the past.  On CTA was also found to have pulmonary nodule.  Repeat CT scan recommened in 3-4 months.  Therefore, needs repeat CT in late July or early AUgust. Orders: INR/PT-[facility] (95621) [facility]- Est  Level 4 (30865)  Problem # 2:  COUMADIN THERAPY (ICD-V58.61) Assessment: Unchanged Dose chagned to 5mg  Mon, Wed, Fri this  week and 7.5 mg on Tues, THu, Sat, Sun.  Return in 1 week for repeat INR.  Orders: INR/PT-[facility] ([id number])  Complete Medication List: 1)  Hydrocodone-acetaminophen 5-500 Mg Tabs (Hydrocodone-acetaminophen) .Marland Kitchen.. 1 tab by mouth two times a day 2)  Atenolol 100 Mg Tabs (Atenolol) .Marland Kitchen.. 1 tab by mouth daily 3)  Meclizine Hcl 25 Mg Tabs (Meclizine hcl) .Marland Kitchen.. 1 tab by mouth q 8 hrs as needed dizziness 4)  Clonazepam 0.5 Mg Tabs (Clonazepam) .Marland Kitchen.. 1 tab by mouth bid 5)  Zolpidem Tartrate 10 Mg Tabs (Zolpidem tartrate) .Marland Kitchen.. 1 tab by mouth qhs as needed sleep. 6)  Hydrochlorothiazide 25 Mg Tabs (Hydrochlorothiazide) .Marland Kitchen.. 1 tab by mouth daily for high blood pressure 7)  Fluoxetine Hcl 20 Mg Tabs (Fluoxetine hcl) .... 3 tabs by mouth qam 8)  Nexium 40 Mg Cpdr (Esomeprazole magnesium) .Marland Kitchen.. 1 tab by mouth daily for reflux 9)  Flovent Hfa 220 Mcg/act Aero (Fluticasone propionate  hfa) .Marland Kitchen.. 1 puff inhaled bid 10)  Ventolin Hfa 108 (90 Base) Mcg/act Aers (Albuterol sulfate) .... 2 puffs q 4 hrs as needed shortness of breath or wheezing. 11)  Potassium Chloride Cr 10 Meq Cr-tabs (Potassium chloride) .... 2 tabs by mouth daily 12)  Simvastatin 40 Mg Tabs (Simvastatin) .Marland Kitchen.. 1 tab by mouth qhs for high cholesterol 13)  Robafen Ac 100-10 Mg/38ml Syrp (Guaifenesin-codeine) .Marland Kitchen.. 1-2 tsp  by mouth q 8 hrs as needed cough disp: 60ml  Patient Instructions: 1)  Thank you for coming in.  I'm glad you are doing better! 2)  Your INR (blood thinness) is 3.5 today.  We want it between 2 and 3.  Therefore, we are going to lower your dose.  Please use the calendar we gave you to see what dose to take.   3)  You will take 1 tablet (5mg ) on Monday, Wednesday, and Friday this week.  YOu will take 1.5 tablets (7.5mg ) on Tuesday, Thursday, Saturday, and SUnday this week.  Return on Monday to have your INR checked again.  4)  For a vitamin, I recommend a Women's Once Daily Multivitamin.  It is available at any pharmacy or grocery store.  5)   Please return in 3 months for your next check-up or sooner if needed.  Prescriptions: ZOLPIDEM TARTRATE 10 MG TABS (ZOLPIDEM TARTRATE) 1 tab by mouth qHS as needed sleep.  #15 x 3   Entered and Authorized by:   Ardeen Garland  MD   Signed by:   Ardeen Garland  MD on 01/30/2010   Method used:   Print then Give to Patient   RxID:   [id number] CLONAZEPAM 0.5 MG TABS (CLONAZEPAM) 1 tab by mouth BID  #60 x 2   Entered and Authorized by:   Ardeen Garland  MD   Signed by:   Ardeen Garland  MD on 01/30/2010   Method used:   Print then Give to Patient   RxID:   [id number]    ANTICOAGULATION RECORD  NEW REGIMEN & LAB RESULTS Anticoag. Dx: Pulmonary Embolism Current INR Goal Range: 2-3 Current INR: 3.5 Current Coumadin Dose(mg): 5mg  tablets Regimen: 5mg  M,W,F; 7.5mg  other days  Provider: Dr. Georgiana Shore Repeat testing in: 1 week Other Comments: ...........test performed by...........Marland KitchenTerese Door, CMA   Dose has been reviewed with patient or caretaker during this visit. Reviewed by: Dr. Georgiana Shore  Anticoagulation Visit Questionnaire Coumadin dose missed/changed:  No Abnormal Bleeding Symptoms:  No  Any diet changes including alcohol intake, vegetables or greens since the last visit:  No Any illnesses or hospitalizations since the last visit:  No Any signs of clotting since the last visit (including chest discomfort, dizziness, shortness of breath, arm tingling, slurred speech, swelling or redness in leg):  Yes      Signs of Clotting:  Has had  some dizziness but has a hx of vertigo.  MEDICATIONS HYDROCODONE-ACETAMINOPHEN 5-500 MG TABS (HYDROCODONE-ACETAMINOPHEN) 1 tab by mouth two times a day ATENOLOL 100 MG TABS (ATENOLOL) 1 tab by mouth daily MECLIZINE HCL 25 MG TABS (MECLIZINE HCL) 1 tab by mouth q 8 hrs as needed dizziness CLONAZEPAM 0.5 MG TABS (CLONAZEPAM) 1 tab by mouth BID ZOLPIDEM TARTRATE 10 MG TABS (ZOLPIDEM TARTRATE) 1 tab by mouth qHS as needed sleep. HYDROCHLOROTHIAZIDE  25 MG TABS (HYDROCHLOROTHIAZIDE) 1 tab by mouth daily for high blood pressure FLUOXETINE HCL 20 MG TABS (FLUOXETINE HCL) 3 tabs by mouth qAM NEXIUM 40 MG CPDR (ESOMEPRAZOLE MAGNESIUM) 1 tab by mouth daily for reflux FLOVENT HFA 220 MCG/ACT AERO (FLUTICASONE PROPIONATE  HFA) 1 puff inhaled BID VENTOLIN HFA 108 (90 BASE) MCG/ACT AERS (ALBUTEROL SULFATE) 2 puffs q 4 hrs as needed shortness of breath or wheezing. POTASSIUM CHLORIDE CR 10 MEQ CR-TABS (POTASSIUM CHLORIDE) 2 tabs by mouth daily SIMVASTATIN 40 MG TABS (SIMVASTATIN) 1 tab by mouth qHS for high cholesterol ROBAFEN AC 100-10 MG/5ML SYRP (GUAIFENESIN-CODEINE) 1-2 tsp by mouth q 8 hrs as  needed cough disp: 60mL

## 2010-10-24 NOTE — Assessment & Plan Note (Signed)
Summary: coumadin/chronic pain   Vital Signs:  Patient profile:   70 year old female Weight:      239.2 pounds Pulse rate:   72 / minute BP sitting:   160 / 98  (right arm) Cuff size:   large  Vitals Entered By: Arlyss Repress CMA, (June 08, 2010 10:47 AM) CC: refill meds. f/up ED. f/up back pain. pt is very upset, did not want to have INR checked. I was very friendly with the pt and wanted to help her carry her pocket book and also assisted her, tried to help...but pt is very angry because she wants her pain injections and not coumadin check. said, that she does not want to be here or anywhere and does not want to do anything. Is Patient Diabetic? No Pain Assessment Patient in pain? yes        Primary Care Provider:  Ellin Mayhew MD  CC:  refill meds. f/up ED. f/up back pain. pt is very upset, did not want to have INR checked. I was very friendly with the pt and wanted to help her carry her pocket book and also assisted her, tried to help...but pt is very angry because she wants her pain injections and not coumadin check. said, and that she does not want to be here or anywhere and does not want to do anything.Marland Kitchen  History of Present Illness: PE/coumadin: Pt states that she sometimes wants to stop the coumadin so that she could get the back injections that she used to get through her orthopedist.  The refuse to do these while she is on coumadin 2/2 risk of bleeding.  Reviewed with pt the importance of taking the coumadin and her past medical hx of clot formation and the risk of death if she were to form another PE.   chronic back pain: No changes since last exam. Lower back. went to urgent care 2/2 continued pain.  given percocet. was told to go to pcp for stronger pain med. Pt canceled appt for second opinion at [facility] said most likely nto going to tell her anything that she hasn't heard already.  She states that she is happy with her orthopedist here in town she is simply upset  that she can not get the injections that she used to get since now she is on coumadin.  I spent time explaining some of the risks of bleeding with injections when a pt is on coumadin.  She said that she had not had it explained to her in this way before and that now she understood.   Discussed current pain med regimen of tylenol and tramadol.  Pt states that she needs something stronger for her chronic pain.  schizophrenia: Pt states that she sees her psychologist 2 x month as well as her psychiatrist for medications on a frequent basis.  Pt gave me permission to touch base with her providers so that I can ensure that we are all on the same page.  Pt provided the contat information for psychologist-- Lou Miner- [phone].  Will see if her psychologist has the # of the MD that she sees through the [facility] so that I can touch base with that provider as well.  Pt is in aggrement with this plan.  no shows for lab work appts: Pt states that she didn't fully understand the importance of getting her coumadin checked.  I explained that it is important that she not miss appointments so that we can get her coumadin  level stabalized, also b/c it is not safe for her to miss lab checks.  Pt states understanding.  Habits & Providers  Alcohol-Tobacco-Diet     Tobacco Status: never  Current Medications (verified): 1)  Atenolol 100 Mg Tabs (Atenolol) .Marland Kitchen.. 1 Tab By Mouth Daily 2)  Meclizine Hcl 25 Mg Tabs (Meclizine Hcl) .Marland Kitchen.. 1 Tab By Mouth Q 8 Hrs As Needed Dizziness 3)  Clonazepam 0.5 Mg Tabs (Clonazepam) .Marland Kitchen.. 1 Tab By Mouth Bid 4)  Hydrochlorothiazide 25 Mg Tabs (Hydrochlorothiazide) .Marland Kitchen.. 1 Tab By Mouth Daily For High Blood Pressure 5)  Fluoxetine Hcl 20 Mg Tabs (Fluoxetine Hcl) .... 2 Tabs By Mouth Qam 6)  Nexium 40 Mg Cpdr (Esomeprazole Magnesium) .Marland Kitchen.. 1 Tab By Mouth Daily For Reflux 7)  Flovent Hfa 220 Mcg/act Aero (Fluticasone Propionate  Hfa) .Marland Kitchen.. 1 Puff Inhaled Bid 8)  Ventolin Hfa 108 (90  Base) Mcg/act Aers (Albuterol Sulfate) .... 2 Puffs Q 4 Hrs As Needed Shortness of Breath or Wheezing. 9)  Potassium Chloride Cr 10 Meq Cr-Tabs (Potassium Chloride) .... 2 Tabs By Mouth Daily 10)  Simvastatin 40 Mg Tabs (Simvastatin) .Marland Kitchen.. 1 Tab By Mouth Qhs For High Cholesterol 11)  Tramadol Hcl 50 Mg Tabs (Tramadol Hcl) .Marland Kitchen.. 1 Tab By Mouth Three Times A Day As Needed For Pain. 12)  Coumadin 5 Mg Tabs (Warfarin Sodium) .... Take As Directed  Allergies (verified): No Known Drug Allergies  Review of Systems       No fever, No SOB, No CP, No cough, No calf pain.  Continued lower back pain.  No urinary or stool incontinence.  No loss of sensation.  Physical Exam  General:  VSS Well-developed,well-nourished,in no acute distress; alert,appropriate and cooperative throughout examination Lungs:  Normal respiratory effort, chest expands symmetrically. Lungs are clear to auscultation, no crackles or wheezes. Heart:  Normal rate and regular rhythm. S1 and S2 normal without gallop, murmur, click, rub or other extra sounds. Msk:  pain with palpation over lower back. Neurologic:  Strength 5/5 in all 4 ext.  Ambulates with cane.  Pt is A/O x 3.  sensation grossly intact in all 4 ext.  Psych:  Pt agitated prior to exam.  Calmed down and was normally interactive during exam with moments of agitiation.Oriented X3, good eye contact, not suicidal, and not homicidal.     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  Pt states understanding and agrees to continue coumadin therapy.   Her updated medication list for this problem includes:    Coumadin 5 Mg Tabs (Warfarin sodium) .Marland Kitchen... Take as directed  Orders: [facility]- Est  Level 4 (04540)  Problem # 2:  OTHER CHRONIC PAIN (ICD-338.29) Pt to take tylenol 650mg  by mouth two times a day for basal pain control and tramadol as needed for breakthrough pain. I  explained that I will not be prescribing narcotics for her pain since this is a chronic pain syndrome.  Of note, pt has recent history of overdose.  Explained to pt that if she is not in agreement with this plan that I can try to put in a referral for pain managment clinic.  Pt refuses this consult and states that they didn't help her last time she tried to go to that clinic.  Pt can not recieve the injections that the orthopedist gave her in past 2/2 risk of bleeding on coumadin.   Explained this to pt.  Also pt has recent history of overdose.   Orders: [facility]- Est  Level 4 (91478)  Problem # 3:  SCHIZOPHRENIA (ICD-295.90) Will try to  touch base with psychologist and psychiatrist to get input from them on pt mental health status.  I have concerns about this pt since she doesn't seem to be compliant with coumadin.  She misses appts with lab and sometimes it isn't clear if she is taking coumadin consistently.  If we can maximize her treatment for her underlying psychiatric illness I believe her level of function would improve and hopefully she could be more compliant with medications and possibly able to think clearer and remember the rationale behind our current pain med regimen.   currently she forgets the plan and the treatment options I have offered and will call the clinic upset that i am undertreating her pain.  Will continue to reinforce teaching and answer pt questions.    Orders: [facility]- Est  Level 4 (29562)  Complete Medication List: 1)  Atenolol 100 Mg Tabs (Atenolol) .Marland Kitchen.. 1 tab by mouth daily 2)  Meclizine Hcl 25 Mg Tabs (Meclizine hcl) .Marland Kitchen.. 1 tab by mouth q 8 hrs as needed dizziness 3)  Clonazepam 0.5 Mg Tabs (Clonazepam) .Marland Kitchen.. 1 tab by mouth bid 4)  Hydrochlorothiazide 25 Mg Tabs (Hydrochlorothiazide) .Marland Kitchen.. 1 tab by mouth daily for high blood pressure 5)  Fluoxetine Hcl 20 Mg Tabs (Fluoxetine hcl) .... 2 tabs by mouth qam 6)  Nexium 40 Mg Cpdr (Esomeprazole magnesium) .Marland Kitchen.. 1 tab by mouth  daily for reflux 7)  Flovent Hfa 220 Mcg/act Aero (Fluticasone propionate  hfa) .Marland Kitchen.. 1 puff inhaled bid 8)  Ventolin Hfa 108 (90 Base) Mcg/act Aers (Albuterol sulfate) .... 2 puffs q 4 hrs as needed shortness of breath or wheezing. 9)  Potassium Chloride Cr 10 Meq Cr-tabs (Potassium chloride) .... 2 tabs by mouth daily 10)  Simvastatin 40 Mg Tabs (Simvastatin) .Marland Kitchen.. 1 tab by mouth qhs for high cholesterol 11)  Tramadol Hcl 50 Mg Tabs (Tramadol hcl) .Marland Kitchen.. 1 tab by mouth three times a day as needed for pain. 12)  Coumadin 5 Mg Tabs (Warfarin sodium) .... Take as directed  Other Orders: INR/PT-[facility] (13086)  Patient Instructions: 1)  Take tylenol 650mg  by mouth two times a day. 2)  If this doesn't control your pain take tramadol.  3)  I am not going to prescribe any narcotics for your back pain. 4)  If you would like me to talk with the pain management about possibly getting you back into their clinic.  5)  return to see me in 1 month. Prescriptions: FLOVENT HFA 220 MCG/ACT AERO (FLUTICASONE PROPIONATE  HFA) 1 puff inhaled BID  #1 x 3   Entered and Authorized by:   Ellin Mayhew MD   Signed by:   Ellin Mayhew MD on 06/08/2010   Method used:   Electronically to        [facility] (retail)       [address]       [place], [place]  [address]       Ph: [phone]       Fax: [phone]   RxID:   [id number]    ANTICOAGULATION RECORD PREVIOUS REGIMEN & LAB RESULTS Anticoagulation Diagnosis:  Pulmonary Embolism on  01/30/2010 Previous INR Goal Range:  2-3 on  01/30/2010 Previous INR:  2.0 on  05/24/2010 Previous Coumadin Dose(mg):  5mg  tablets on  01/30/2010 Previous Regimen:  5 mg - Sun &  Wed;  7.5 mg - other days on  05/24/2010 Previous Coagulation Comments:  Pill box filled with her Coumadin dose for this week. on  04/21/2010  NEW REGIMEN & LAB RESULTS Current INR: 2.0 Regimen: 5 mg - Sun & Wed;  7.5 mg - other days  (no change)  Provider:  Edmonia James Other Comments: ...............test performed by......Marland KitchenBonnie A. [place], MLS (ASCP)cm   Reviewed by: Dr. Edmonia James  Anticoagulation Visit Questionnaire Coumadin dose missed/changed:  Yes Coumadin Dose Comments:  , since Sept 7 (appt date) has been taking 5 mg daily,  just didn't want to come for INR check on the 7th,  pt states she missed pills, sometimes she takes it and sometimes she doesn't,  "I take it when I feel like it" Abnormal Bleeding Symptoms:  No  Any diet changes including alcohol intake, vegetables or greens since the last visit:  No Any illnesses or hospitalizations since the last visit:  Yes      Recent Illness/Hospitalizations:  went to ED for pain Any signs of clotting since the last visit (including chest discomfort, dizziness, shortness of breath, arm tingling, slurred speech, swelling or redness in leg):  No  MEDICATIONS ATENOLOL 100 MG TABS (ATENOLOL) 1 tab by mouth daily MECLIZINE HCL 25 MG TABS (MECLIZINE HCL) 1 tab by mouth q 8 hrs as needed dizziness CLONAZEPAM 0.5 MG TABS (CLONAZEPAM) 1 tab by mouth BID HYDROCHLOROTHIAZIDE 25 MG TABS (HYDROCHLOROTHIAZIDE) 1 tab by mouth daily for high blood pressure FLUOXETINE HCL 20 MG TABS (FLUOXETINE HCL) 2 tabs by mouth qAM NEXIUM 40 MG CPDR (ESOMEPRAZOLE MAGNESIUM) 1 tab by mouth daily for reflux FLOVENT HFA 220 MCG/ACT AERO (FLUTICASONE PROPIONATE  HFA) 1 puff inhaled BID VENTOLIN HFA 108 (90 BASE) MCG/ACT AERS (ALBUTEROL SULFATE) 2 puffs q 4 hrs as needed shortness of breath or wheezing. POTASSIUM CHLORIDE CR 10 MEQ CR-TABS (POTASSIUM CHLORIDE) 2 tabs by mouth daily SIMVASTATIN 40 MG TABS (SIMVASTATIN) 1 tab by mouth qHS for high cholesterol TRAMADOL HCL 50 MG TABS (TRAMADOL HCL) 1 tab by mouth three times a day as needed for pain. COUMADIN 5 MG TABS (WARFARIN SODIUM) take as directed

## 2010-10-24 NOTE — Progress Notes (Signed)
Summary: RE: INR/TS  ---- Converted from flag ---- ---- 01/30/2010 12:01 PM, Ardeen Garland  MD wrote: Can you call [facility] and let them know that Ms. Eichel no longer needs home INR checks.  She will just get them here. ------------------------------  called [facility] @ [phone] and advised of above.

## 2010-10-24 NOTE — Progress Notes (Signed)
Summary: rx's ready/ts  Phone Note Refill Request Call back at Home Phone [phone] Message from:  Patient  Refills Requested: Medication #1:  TRAMADOL HCL 50 MG TABS 1 tab by mouth three times a day as needed pain  Medication #2:  CLONAZEPAM 0.5 MG TABS 1 tab by mouth BID [facility]  Initial call taken by: Clydell Hakim,  January 03, 2010 8:56 AM  Follow-up for Phone Call        done.  pt seems to have run out early on tramadol (had 5 refills sent back in 09/2009) and unsure if getting clonopin from [place] - have refilled both x 1 month and signed printed script and given to red team.  pt to f/u with pcp. Follow-up by: Eustaquio Boyden  MD,  January 03, 2010 9:27 AM  Additional Follow-up for Phone Call Additional follow up Details #1::        called pt to pick up rx/s. Additional Follow-up by: Arlyss Repress CMA,,  January 03, 2010 10:31 AM    Prescriptions: TRAMADOL HCL 50 MG TABS (TRAMADOL HCL) 1 tab by mouth three times a day as needed pain  #90 x 0   Entered and Authorized by:   Eustaquio Boyden  MD   Signed by:   Eustaquio Boyden  MD on 01/03/2010   Method used:   Print then Give to Patient   RxID:   [id number] CLONAZEPAM 0.5 MG TABS (CLONAZEPAM) 1 tab by mouth BID  #60 x 0   Entered and Authorized by:   Eustaquio Boyden  MD   Signed by:   Eustaquio Boyden  MD on 01/03/2010   Method used:   Print then Give to Patient   RxID:   [id number]

## 2010-10-24 NOTE — Assessment & Plan Note (Signed)
Summary: back pain radiating/[place]/mayan   Vital Signs:  Patient profile:   70 year old female Weight:      243 pounds Temp:     98.5 degrees F oral Pulse rate:   65 / minute BP sitting:   171 / 94  (left arm) Cuff size:   large  Vitals Entered By: Tessie Fass CMA (November 14, 2009 9:35 AM) CC: lower back pain radiating down right leg Is Patient Diabetic? No Pain Assessment Patient in pain? yes     Location: lower back Intensity: 10   Primary Care Provider:  Ardeen Garland  MD  CC:  lower back pain radiating down right leg.  History of Present Illness: CC: back pain  1wk h/o back pain radiating to right leg.  Went to [facility] with son for seizure, so pt decided to be seen as well.  told was musle spasm and given ibuprofen and valium.  Also tried tylenol ES and alleve.  per pt, h/o spinal fusion and pinched nerve in back on right side.  Bending hurts.  Unable to sleep on right.  Hurts on right side as well, radiates down to right knee, not past knee.  No fevers, no h/o cancer, no bowel/bladder incontinence, no numbness.  No injury/trauma to back or leg.  States eating lots of saltine crackers, adds salt to them.  Habits & Providers  Alcohol-Tobacco-Diet     Tobacco Status: never  Allergies (verified): No Known Drug Allergies PMH reviewed for relevance  Physical Exam  General:  obese, alert, NAD, vitals reviewed, hypertensive.  able to move around exam room, get on table without grimace or pain. Msk:  limited ROM at spine 2/2 pain in flexion, extension, rotation.  nontender to palpation spine midline.  no SI joint pain.  + equivocal right trochanter pain to palpation.  Negative straight leg raise bilaterally.  left hip no pain with int/ext rotation.  right hip pain in groin with internal rotation.   Pulses:  2+ radial and dp pulses Extremities:  no edema   Impression & Recommendations:  Problem # 1:  MUSCLE SPASM (ICD-728.85)  increase tramadol to 2 tablets two times  a day x 10 days, ice area, stop valium as on klonopin, try flexeril.  Discussed abductor strengthening and core strengthening.  To PT for this per patient's desire.  discussed how obesity likely playing large part of deconditioning/pain.  Orders: Physical Therapy Referral (PT) [facility]- Est Level  3 (04540)  Problem # 2:  OBESITY, UNSPECIFIED (ICD-278.00) advised against crackers, to increase vegetables, and to maintain activity.  Complete Medication List: 1)  Tramadol Hcl 50 Mg Tabs (Tramadol hcl) .Marland Kitchen.. 1 tab by mouth three times a day as needed pain 2)  Atenolol 100 Mg Tabs (Atenolol) .Marland Kitchen.. 1 tab by mouth daily 3)  Meclizine Hcl 25 Mg Tabs (Meclizine hcl) .Marland Kitchen.. 1 tab by mouth q 8 hrs as needed dizziness 4)  Clonazepam 0.5 Mg Tabs (Clonazepam) .Marland Kitchen.. 1 tab by mouth bid 5)  Zolpidem Tartrate 10 Mg Tabs (Zolpidem tartrate) .Marland Kitchen.. 1 tab by mouth qhs as needed sleep.  needs visit with pcp prior to more refills. 6)  Hydrochlorothiazide 25 Mg Tabs (Hydrochlorothiazide) .Marland Kitchen.. 1 tab by mouth daily for high blood pressure 7)  Fluoxetine Hcl 20 Mg Tabs (Fluoxetine hcl) .... 3 tabs by mouth qam 8)  Nexium 40 Mg Cpdr (Esomeprazole magnesium) .Marland Kitchen.. 1 tab by mouth daily for reflux 9)  Flovent Hfa 220 Mcg/act Aero (Fluticasone propionate  hfa) .Marland Kitchen.. 1 puff  inhaled bid 10)  Ventolin Hfa 108 (90 Base) Mcg/act Aers (Albuterol sulfate) .... 2 puffs q 4 hrs as needed shortness of breath or wheezing. 11)  Tramadol Hcl 50 Mg Tabs (Tramadol hcl) .... Take two tablets twice a day for 10 days then back to previous dose 12)  Flexeril 10 Mg Tabs (Cyclobenzaprine hcl) .... One by mouth two times a day  Patient Instructions: 1)  I think you have muscle spasm of your lower back and hip.  2)  If your hip pain continues you may need an xray of the hip. 3)  Increase tramadol 2 tablets twice daily for 10 days.  Stop Valium, start flexeril twice daily. 4)  We will send you for physical therapy.  remember to alternate Ice and warm  compress to right hip 5)  Please follow up in 2-3 weeks for improvement, sooner if worsening. 6)  Pleasure to meet you. Prescriptions: FLEXERIL 10 MG TABS (CYCLOBENZAPRINE HCL) one by mouth two times a day  #20 x 0   Entered and Authorized by:   Eustaquio Boyden  MD   Signed by:   Eustaquio Boyden  MD on 11/14/2009   Method used:   Electronically to        [facility] (retail)       [address]       [place], [place]  [id number]       Ph: [phone]       Fax: [phone]   RxID:   [id number] TRAMADOL HCL 50 MG TABS (TRAMADOL HCL) take two tablets twice a day for 10 days then back to previous dose  #40 x 0   Entered and Authorized by:   Eustaquio Boyden  MD   Signed by:   Eustaquio Boyden  MD on 11/14/2009   Method used:   Electronically to        [facility] (retail)       [address]       [place], [place]  [address]       Ph: [phone]       Fax: [phone]   RxID:   [id number]

## 2010-10-24 NOTE — Initial Assessments (Signed)
Summary: Pulmonary Embolism  PCP:  Ardeen Garland, MD, at [facility]  CC:  Pulmonary Embolism  HPI:  70 YOF w/ 3 month hx/o progressive weakness, dyspnea, and pleuritic CP. Pt states that she noticed being SOB and having generalized weakness up to 3 months ago. Pt states that she noticed generalized weakness and shortness of breath earlie; however, attributed weakness and SOB to baseline asthma. Pt has used intermittent flovent/ventolin w/ minimal improvement in baseline SOB. Pt describes CP as pleuritic in nature, w/ CP w/ deep inspiration; radiation of CP to bilateral lower lung fields. Pt denies any CP w/ radiation to neck, jaw; no diaphoresis, nausea, vomiting. Pt denies fevers or chills. Pt evaluated at [facility] with finding of large central bilateral PEs, sent to [facility] for further evaluation.    ROS:  Negative except as noted above in HPI.   ALL:  NKDA  MED: Vicodin 5-500 1 tab po bid Atenolol 100 mg po daily Meclizine 25 mg po q8h prn dizziness Clonazepam 0.5 mg po bid Zolpidem 10 mg po qhs prn insomnia HCTZ 25 mg po daily Fluoxetine 60 mg po daily Nexium 40 mg po daily Flovent HFA 220 mcg/act, 1 puff inhaled bid Ventolin HFA 108 mcg/act, 2 puffs inhaled q4h prn dyspnea/wheeze KCl 20 mEq po daily Simvastatin 40 mg po qhs  PMH: Asthma Muscle Cramps Hyperlipidemia HTN Obesity Depression Schizophrenia.  Follwed by mental health.  Dr. Warren Danes, therapist. 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  Has 2 other children.  [name] is mentally handicapped and has a severe seizure disorder.  SHe is primary caretaker for him.  No smoking, alcohol, drugs.  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  Never worked outside the home.  PE:     T: 98.7  HR: 83   RR: 18   BP: 131/89 SaO2: 99%  on 2L   GEN: up in bed,NAD   NECK: large neck girth, full ROM   CARDIO: RRR, no rubs, gallops, murmurs   RESP: decreased breath sounds diffusely, exp wheezes, throughout    ABD: obese, NT/ND   EXT: 2+ peripheral pulses. + vericose changes in LEs bilaterally.  LABS/RADIOLOGY: BMet:  135 / 3.8 / 101 / 27 / 12 / 1.06 / 148, Ca 9.5 T-Bili 0.2, Alk Phos 99, AST 32, ALT 27, T-Prot 8.8, Alb 3.8 CBC:  10.8 / 11.9 / 36.2 / 238 Lipase 18 D-Dimer 2.41 U/A:  yellow, cloudy, sg 1.031, pH 7.5, gluc neg, bili neg, ket neg, blood neg, prot 30, urobil 1.0, nitrite neg, leuk small Urine Micro:  squam many, wbc 0-2, bacteria rare, mucous present CXR (2-view):  No infiltrate.  Chronic bronchitic changes. CT Angiogram Chest:  Large central bilateral pulmonary emboli.  Single questionable tiny nodular density right upper lobe, recommendations below.  If the patient is at high risk for bronchogenic carcinoma, follow-up chest CT at 1 year is recommended.  If the patient is at low risk, no follow-up is needed.  A/P:  70 YOF w/ pulmonary embolism PULMONARY EMBOLISM.  Begin treatment dose Heparin while bridging to Warfarin. Will consider transition to lovenox if pt can tolerate injections and self injecting in outpatien setting. Will also rule out cardiac source w/ CEsx3. Will also give pt GI cocktail to rule out GI  source of pt w/ intermittent CP w/ eating.  ASTHMA.  Continue home Flovent, Ventolin. Currently clinically stable without increased WOB on exam, minimal O2 requirement.  OSA: Pt reports hx/o sleep apnea. Will titrate CPAP while in house. HTN.  Continue home Atenolol, HCTZ. PSCYHIATRIC.  Continue home Clonazepam, Zolpidem, Fluoxetine. CHRONIC PAIN.  Continue home Vicodin. GERD.  PPI HYPERLIPIDEMIA.  Continue home Simvastatin. RUL NODULE.  Risk stratify patient for bronchogenic carcinoma and follow up chest CT in the future as indicated. FEN/GI.  Heart healthy, low sodium diet.  [place]  IVF. PROPHYLAXIS.  Not indicated, patient is anticoagulated for pulmonary embolism. DISPOSITION.  Pending anticoagulation.

## 2010-10-24 NOTE — Miscellaneous (Signed)
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

## 2010-10-24 NOTE — Miscellaneous (Signed)
  Clinical Lists Changes  Problems: Removed problem of MUSCLE CRAMPS (ICD-729.82)      Allergies: No Known Drug Allergies

## 2010-10-24 NOTE — Progress Notes (Signed)
Summary: Rx  Phone Note Refill Request Call back at Home Phone [phone] Message from:  Patient  Refills Requested: Medication #1:  COUMADIN 5 MG TABS take as directed. Needs asap  Initial call taken by: Knox Royalty,  June 21, 2010 2:16 PM  Follow-up for Phone Call        pt is completely out Follow-up by: De Nurse,  June 22, 2010 9:12 AM  Additional Follow-up for Phone Call Additional follow up Details #1::        Rx complete- Will ask clinic staff call pt and let her know that I have sent it to here pharmacy.    Additional Follow-up for Phone Call Additional follow up Details #2::    Husband called and said patient need her Coumadin.  Can send to [facility] pharmacy on [address]   Please call Follow-up by: Abundio Miu,  June 22, 2010 10:40 AM  Additional Follow-up for Phone Call Additional follow up Details #3:: Details for Additional Follow-up Action Taken: called pt, informed of Rx at pharmacy. Additional Follow-up by: Tessie Fass CMA,  June 22, 2010 11:37 AM  Prescriptions: COUMADIN 5 MG TABS (WARFARIN SODIUM) take as directed  #30 x 3   Entered and Authorized by:   Ellin Mayhew MD   Signed by:   Ellin Mayhew MD on 06/22/2010   Method used:   Electronically to        [facility] (retail)       [address]       [place], [place]  [id number]       Ph: [phone]       Fax: [phone]   RxID:   [id number]

## 2010-10-24 NOTE — Consult Note (Signed)
Summary: [facility]   Imported By: De Nurse 08/15/2010 10:57:22  _____________________________________________________________________  External Attachment:    Type:   Image     Comment:   External Document

## 2010-10-25 ENCOUNTER — Encounter (HOSPITAL_BASED_OUTPATIENT_CLINIC_OR_DEPARTMENT_OTHER): Payer: Medicare Other | Admitting: Oncology

## 2010-10-25 DIAGNOSIS — I471 Supraventricular tachycardia, unspecified: Secondary | ICD-10-CM

## 2010-10-25 DIAGNOSIS — I8390 Asymptomatic varicose veins of unspecified lower extremity: Secondary | ICD-10-CM

## 2010-10-25 DIAGNOSIS — D649 Anemia, unspecified: Secondary | ICD-10-CM

## 2010-10-25 DIAGNOSIS — I2699 Other pulmonary embolism without acute cor pulmonale: Secondary | ICD-10-CM

## 2010-10-25 LAB — PROTIME-INR
INR: 3.5 (ref 2.00–3.50)
Protime: 42 Seconds — ABNORMAL HIGH (ref 10.6–13.4)

## 2010-10-25 LAB — CBC & DIFF AND RETIC
BASO%: 0.3 % (ref 0.0–2.0)
EOS%: 4 % (ref 0.0–7.0)
HCT: 31.5 % — ABNORMAL LOW (ref 34.8–46.6)
LYMPH%: 24.9 % (ref 14.0–49.7)
MCH: 29 pg (ref 25.1–34.0)
MCHC: 33 g/dL (ref 31.5–36.0)
MONO%: 5.6 % (ref 0.0–14.0)
NEUT%: 65.2 % (ref 38.4–76.8)
Platelets: 274 10*3/uL (ref 145–400)
RBC: 3.59 10*6/uL — ABNORMAL LOW (ref 3.70–5.45)

## 2010-10-25 LAB — MORPHOLOGY: PLT EST: ADEQUATE

## 2010-10-25 LAB — CHCC SMEAR

## 2010-10-26 NOTE — Progress Notes (Signed)
Summary: Rx  Phone Note Refill Request Call back at Home Phone [phone]   pt trying to get ambien & flexeril refilled.   Initial call taken by: Knox Royalty,  September 29, 2010 3:55 PM    These medications are no longer part of this patients med list.  These are medications that she has taken in the past but are not longer active prescriptions.  Will you please let her know this and tell her if she would like to discuss further I would be glad to make her an appt in my clinic.  Ellin Mayhew MD  October 02, 2010 5:42 PM   attempted to call pt, no answer or vm, will try again later. Knox Royalty  October 05, 2010 9:55 AM    attempted to call pt again, no answer or vm. Pt has appt Friday 10/13/10, MD will address during appt.

## 2010-10-26 NOTE — Progress Notes (Signed)
----   Converted from flag ---- ---- 09/01/2010 1:25 PM, Ellin Mayhew MD wrote: Can you please request pt's last visit information at [facility] as well as information from her appt with Dr. Otelia Sergeant (last note from [facility]opedic said that they were going to refer to surgeon).  Thanks, Dawn ------------------------------  called and requested records.

## 2010-10-26 NOTE — Consult Note (Signed)
Summary: [facility]   Imported By: Knox Royalty 09/05/2010 10:00:14  _____________________________________________________________________  External Attachment:    Type:   Image     Comment:   External Document

## 2010-10-26 NOTE — Progress Notes (Signed)
Summary: phn msg  Phone Note Call from Patient Call back at Home Phone [phone]   Caller: Patient Summary of Call: pt cannot come today and wanted to speak w/ [place] about going to pain management clinic resch for 09/28/10 Initial call taken by: De Nurse,  September 05, 2010 9:14 AM  Follow-up for Phone Call        attempt to return pt phone call.  No answer.  No VM so unable to leave message.  Ellin Mayhew MD  September 08, 2010 2:55 PM   Additional Follow-up for Phone Call Additional follow up Details #1::        attempt to return pt phone call.  no answer. No VM so unable to leave message.  Ellin Mayhew MD  September 11, 2010 8:37 PM

## 2010-10-27 ENCOUNTER — Encounter: Payer: Self-pay | Admitting: Family Medicine

## 2010-10-27 ENCOUNTER — Emergency Department (HOSPITAL_COMMUNITY)
Admission: EM | Admit: 2010-10-27 | Discharge: 2010-10-27 | Disposition: A | Discharge status: Home / Self Care | Payer: Medicare Other | Attending: Emergency Medicine | Admitting: Emergency Medicine

## 2010-10-27 DIAGNOSIS — E669 Obesity, unspecified: Secondary | ICD-10-CM | POA: Insufficient documentation

## 2010-10-27 DIAGNOSIS — I10 Essential (primary) hypertension: Secondary | ICD-10-CM | POA: Insufficient documentation

## 2010-10-27 DIAGNOSIS — F341 Dysthymic disorder: Secondary | ICD-10-CM | POA: Insufficient documentation

## 2010-10-27 DIAGNOSIS — Z7901 Long term (current) use of anticoagulants: Secondary | ICD-10-CM | POA: Insufficient documentation

## 2010-10-27 DIAGNOSIS — J329 Chronic sinusitis, unspecified: Secondary | ICD-10-CM | POA: Insufficient documentation

## 2010-10-27 DIAGNOSIS — Z79899 Other long term (current) drug therapy: Secondary | ICD-10-CM | POA: Insufficient documentation

## 2010-10-27 DIAGNOSIS — J3489 Other specified disorders of nose and nasal sinuses: Secondary | ICD-10-CM | POA: Insufficient documentation

## 2010-10-27 DIAGNOSIS — M549 Dorsalgia, unspecified: Secondary | ICD-10-CM | POA: Insufficient documentation

## 2010-10-27 DIAGNOSIS — Z86718 Personal history of other venous thrombosis and embolism: Secondary | ICD-10-CM | POA: Insufficient documentation

## 2010-10-27 DIAGNOSIS — G8929 Other chronic pain: Secondary | ICD-10-CM | POA: Insufficient documentation

## 2010-10-27 DIAGNOSIS — H9209 Otalgia, unspecified ear: Secondary | ICD-10-CM | POA: Insufficient documentation

## 2010-10-27 DIAGNOSIS — R51 Headache: Secondary | ICD-10-CM | POA: Insufficient documentation

## 2010-10-30 ENCOUNTER — Ambulatory Visit: Payer: Medicare Other | Attending: Physical Medicine & Rehabilitation

## 2010-10-30 ENCOUNTER — Ambulatory Visit: Payer: Medicare Other | Admitting: Physical Medicine & Rehabilitation

## 2010-10-30 DIAGNOSIS — F1921 Other psychoactive substance dependence, in remission: Secondary | ICD-10-CM | POA: Insufficient documentation

## 2010-10-30 DIAGNOSIS — G8929 Other chronic pain: Secondary | ICD-10-CM | POA: Insufficient documentation

## 2010-10-30 DIAGNOSIS — F341 Dysthymic disorder: Secondary | ICD-10-CM

## 2010-10-30 DIAGNOSIS — M542 Cervicalgia: Secondary | ICD-10-CM | POA: Insufficient documentation

## 2010-10-30 DIAGNOSIS — J45909 Unspecified asthma, uncomplicated: Secondary | ICD-10-CM | POA: Insufficient documentation

## 2010-10-30 DIAGNOSIS — M961 Postlaminectomy syndrome, not elsewhere classified: Secondary | ICD-10-CM | POA: Insufficient documentation

## 2010-10-30 DIAGNOSIS — M25569 Pain in unspecified knee: Secondary | ICD-10-CM | POA: Insufficient documentation

## 2010-10-30 LAB — LUPUS ANTICOAGULANT PANEL
DRVVT 1:1 Mix: 41.4 secs (ref 36.2–44.3)
PTT Lupus Anticoagulant: 60 secs — ABNORMAL HIGH (ref 30.0–45.6)
PTTLA 4:1 Mix: 41.9 secs (ref 30.0–45.6)

## 2010-10-30 LAB — COMPREHENSIVE METABOLIC PANEL
ALT: 10 U/L (ref 0–35)
AST: 16 U/L (ref 0–37)
Albumin: 3.9 g/dL (ref 3.5–5.2)
CO2: 32 mEq/L (ref 19–32)
Calcium: 9.1 mg/dL (ref 8.4–10.5)
Chloride: 96 mEq/L (ref 96–112)
Creatinine, Ser: 0.87 mg/dL (ref 0.40–1.20)
Potassium: 4 mEq/L (ref 3.5–5.3)

## 2010-10-30 LAB — CARDIOLIPIN ANTIBODIES, IGG, IGM, IGA
Anticardiolipin IgA: 3 APL U/mL (ref <22)
Anticardiolipin IgG: 2 GPL U/mL (ref <23)
Anticardiolipin IgM: 1 MPL U/mL (ref <11)

## 2010-10-30 LAB — IMMUNOFIXATION ELECTROPHORESIS
IgA: 584 mg/dL — ABNORMAL HIGH (ref 68–378)
IgG (Immunoglobin G), Serum: 2050 mg/dL — ABNORMAL HIGH (ref 694–1618)
Total Protein, Serum Electrophoresis: 7.3 g/dL (ref 6.0–8.3)

## 2010-10-30 LAB — BETA-2 GLYCOPROTEIN ANTIBODIES
Beta-2 Glyco I IgG: 0 G Units (ref <20)
Beta-2-Glycoprotein I IgA: 3 A Units (ref <20)
Beta-2-Glycoprotein I IgM: 1 M Units (ref <20)

## 2010-10-30 LAB — ANTITHROMBIN III: AntiThromb III Func: 115 % (ref 76–126)

## 2010-10-30 LAB — IRON AND TIBC: TIBC: 398 ug/dL (ref 250–470)

## 2010-10-30 LAB — D-DIMER, QUANTITATIVE: D-Dimer, Quant: 0.57 ug/mL-FEU — ABNORMAL HIGH (ref 0.00–0.48)

## 2010-10-30 LAB — PROTHROMBIN GENE MUTATION

## 2010-10-30 LAB — VITAMIN B12: Vitamin B-12: 334 pg/mL (ref 211–911)

## 2010-10-30 LAB — FOLATE: Folate: >20 ng/mL

## 2010-10-30 LAB — LACTATE DEHYDROGENASE: LDH: 182 U/L (ref 94–250)

## 2010-11-08 ENCOUNTER — Ambulatory Visit (INDEPENDENT_AMBULATORY_CARE_PROVIDER_SITE_OTHER): Payer: Medicare Other | Admitting: Family Medicine

## 2010-11-08 ENCOUNTER — Encounter: Payer: Self-pay | Admitting: Family Medicine

## 2010-11-08 ENCOUNTER — Ambulatory Visit (INDEPENDENT_AMBULATORY_CARE_PROVIDER_SITE_OTHER): Payer: Medicare Other | Admitting: *Deleted

## 2010-11-08 VITALS — BP 164/95 | HR 67 | Temp 98.0°F | Ht 65.0 in | Wt 238.3 lb

## 2010-11-08 DIAGNOSIS — L299 Pruritus, unspecified: Secondary | ICD-10-CM

## 2010-11-08 DIAGNOSIS — I2699 Other pulmonary embolism without acute cor pulmonale: Secondary | ICD-10-CM

## 2010-11-08 DIAGNOSIS — Z7189 Other specified counseling: Secondary | ICD-10-CM

## 2010-11-08 DIAGNOSIS — F209 Schizophrenia, unspecified: Secondary | ICD-10-CM

## 2010-11-08 DIAGNOSIS — F329 Major depressive disorder, single episode, unspecified: Secondary | ICD-10-CM

## 2010-11-08 DIAGNOSIS — Z7901 Long term (current) use of anticoagulants: Secondary | ICD-10-CM

## 2010-11-08 DIAGNOSIS — J984 Other disorders of lung: Secondary | ICD-10-CM

## 2010-11-08 DIAGNOSIS — R911 Solitary pulmonary nodule: Secondary | ICD-10-CM

## 2010-11-08 DIAGNOSIS — I10 Essential (primary) hypertension: Secondary | ICD-10-CM

## 2010-11-08 DIAGNOSIS — F3289 Other specified depressive episodes: Secondary | ICD-10-CM

## 2010-11-08 DIAGNOSIS — G8929 Other chronic pain: Secondary | ICD-10-CM

## 2010-11-08 LAB — BASIC METABOLIC PANEL
BUN: 8 mg/dL (ref 6–23)
CO2: 31 mEq/L (ref 19–32)
Chloride: 98 mEq/L (ref 96–112)
Creat: 0.91 mg/dL (ref 0.40–1.20)
Potassium: 3.7 mEq/L (ref 3.5–5.3)

## 2010-11-08 NOTE — Patient Instructions (Addendum)
Call me with the name of the cream you are using on your legs. I do not think that you swelling is such that we to change your medicines at this point-  Please return if you swelling gets worse in your legs.  We will check your kidneys today. I want to return in 2 week to recheck your blood pressure.  We may need to increase your blood pressure medication. Decrease salt intake.  I will request the notes from Dr. Cyndie Chime and his workup of your anemia and the note from pain management. Return in 1 month for a follow up appt.

## 2010-11-09 ENCOUNTER — Encounter: Payer: Self-pay | Admitting: Family Medicine

## 2010-11-09 NOTE — Miscellaneous (Signed)
Summary: fx toes  Clinical Lists Change  wearing a boot for fx of toes last month.  out of percocet. pain is "everywhere" has appt here wednesday  with pcp. wants to take 2 Aleve. told her tylenol may be a better choice. states that does not work. told her we do not have current PT/INR labs. states [facility] & they should have sent all records here & that they did labs. told her I will look for them. message to pcp to see if Aleve is ok. told pt I will call her if I get a response before we close.Golden Circle RN  October 27, 2010 4:40 PM  attempted to call pt to respond to above concern. No answer.  No VM available to leave message.  There doesn't seem to be a contraindication between aleve and coumadin.  Will let pt know that she can take as needed for now.  pt is to come to [facility] for coumadin checks.  Will need to discuss this with pt at f/up appt. will discuss above with pt when she calls back.  Ellin Mayhew MD  October 27, 2010 5:10 PM   no answer.Golden Circle RN  October 30, 2010 8:48 AM no answer.Golden Circle RN  October 30, 2010 3:11 PM

## 2010-11-12 ENCOUNTER — Encounter: Payer: Self-pay | Admitting: Family Medicine

## 2010-11-12 DIAGNOSIS — L299 Pruritus, unspecified: Secondary | ICD-10-CM | POA: Insufficient documentation

## 2010-11-12 DIAGNOSIS — R911 Solitary pulmonary nodule: Secondary | ICD-10-CM | POA: Insufficient documentation

## 2010-11-12 DIAGNOSIS — Z7189 Other specified counseling: Secondary | ICD-10-CM | POA: Insufficient documentation

## 2010-11-12 NOTE — Assessment & Plan Note (Signed)
Pt states she is doing well.  No SI or HI currently. No obvious hallucinations or delusions.  Pt to see psych provider harriet long on April 30th at 1000 am.

## 2010-11-12 NOTE — Progress Notes (Signed)
  Subjective:    Patient ID: [name] [name], female    DOB: May 23, 1941, 70 y.o.   MRN: [id number]  HPI  Depression/psych- Pt states that she is doing well.  No hallucinations.  No SI or HI.  Has appt scheduled with psych provider on April 30th at 10:00am  Leg itching- Off and on unknown amount of time (months?) in lower legs,  No redness, sometimes lower ext edema, no dryness, Has not ever had in past that she remembers.  Some pain on the inside and outside of ankle area when pressed on.  Pain management- Pt states that she went to pain management.  They gave her tramadol as her pain med.  Pt did not know any other details from visit.  Pt states that pain comes and goes --pain in control today at visit.  Anemia- Pt states that she was diagnosed with anemia by "someone"- unsure what provider- was seen by Dr. Cyndie Chime for futher workup. Did not know any other details from visit.   PCP - pt has been seeing our clinic as well as [facility].  Pt encouraged to have only 1 pcp.  Told pt that we could either send our records to them or we could request their records be sent to [place].  Pt states that she will request that her records be [facility] to [facility].        Review of Systems  Constitutional: Negative for fever and activity change.  Respiratory: Negative for cough and wheezing.   Cardiovascular: Negative for chest pain and palpitations.  Musculoskeletal: Positive for back pain. Negative for joint swelling and arthralgias.  Neurological: Negative for dizziness, speech difficulty and weakness.  Psychiatric/Behavioral: Negative for hallucinations, behavioral problems and confusion.       Objective:   Physical Exam  Constitutional: She appears well-developed and well-nourished.  Eyes: Conjunctivae and EOM are normal.  Cardiovascular: Normal rate, regular rhythm and normal heart sounds.   Pulmonary/Chest: Effort normal and breath sounds normal.  Abdominal: Soft. She  exhibits no distension. There is no tenderness.  Musculoskeletal: She exhibits no edema.       + tenderness with palpation of medial and lateral ankle area.  No calf tenderness.  No redness of lower ext skin.   Trace lower ext edema.    Psychiatric: She has a normal mood and affect. Her behavior is normal. Her speech is tangential. She expresses no suicidal plans and no homicidal plans.          Assessment & Plan:

## 2010-11-12 NOTE — Assessment & Plan Note (Signed)
Pt reports itching in lower extremities-I am not sure what is causing this sensation. no findings of physical exam.  Pt told that she can try benedryl (with caution) prn for symptom of itching.  Pt to return if not improving within the next week or two.

## 2010-11-12 NOTE — Assessment & Plan Note (Addendum)
External pharmacy info that is showing up in epic shows that pt has had rx for percocet x 2 and ambien x 2 during January.  I do not have the info about who the prescribing providers are.  At next visit will discuss with pt why these agent are not best for her.  Also pt needs to know that she has a pain contract with the pain management clinic and if they are not the ones writing for her percocet than she may be breaking her contract.   I have requested the records for her pain management visit.

## 2010-11-12 NOTE — Assessment & Plan Note (Signed)
Pt has an appt with psych provider harriet Long [phone] on April 30th at 1000 am.

## 2010-11-12 NOTE — Assessment & Plan Note (Signed)
bp elevated- pt states that she forgot her medications this am.  Pt will need to return in 2 weeks for bp recheck.

## 2010-11-12 NOTE — Assessment & Plan Note (Signed)
Need to review health maintenance with patient.   Pt has been using 2 pcp offices.  Discussed with pt that it is important that she chose one medical home.  Pt states that she will have records sent from [facility] to our office b/c she prefers our office.  I will also request that administrative team request records.  Pt also states that she was told she has anemia and she was sent to Dr. Cyndie Chime.  I do not know about this referral or who requested it.  I will ask for this note as well.

## 2010-11-13 ENCOUNTER — Other Ambulatory Visit: Payer: Self-pay | Admitting: Oncology

## 2010-11-13 ENCOUNTER — Encounter (INDEPENDENT_AMBULATORY_CARE_PROVIDER_SITE_OTHER): Payer: Self-pay | Admitting: *Deleted

## 2010-11-13 ENCOUNTER — Encounter (HOSPITAL_BASED_OUTPATIENT_CLINIC_OR_DEPARTMENT_OTHER): Payer: Medicare Other | Admitting: Oncology

## 2010-11-13 DIAGNOSIS — I471 Supraventricular tachycardia, unspecified: Secondary | ICD-10-CM

## 2010-11-13 DIAGNOSIS — I279 Pulmonary heart disease, unspecified: Secondary | ICD-10-CM

## 2010-11-13 DIAGNOSIS — I2699 Other pulmonary embolism without acute cor pulmonale: Secondary | ICD-10-CM

## 2010-11-13 DIAGNOSIS — I8390 Asymptomatic varicose veins of unspecified lower extremity: Secondary | ICD-10-CM

## 2010-11-13 DIAGNOSIS — D649 Anemia, unspecified: Secondary | ICD-10-CM

## 2010-11-14 ENCOUNTER — Telehealth: Payer: Self-pay | Admitting: Family Medicine

## 2010-11-14 LAB — HEPATITIS C ANTIBODY: HCV Ab: NEGATIVE

## 2010-11-14 LAB — ANA: Anti Nuclear Antibody(ANA): NEGATIVE

## 2010-11-14 NOTE — Telephone Encounter (Signed)
Spoke with Ms. Sandeep Delagarza in depth this afternoon about her appts.  She originally called to ask if Dr. Edmonia James had received any records from previous provider.  Mrs. Albert said she will not sign anything to have her records sent to anyone from here or to have records sent to [place].  Expressed displeasure with Dr. Edmonia James, but still refused to decide whether she will transfer out.

## 2010-11-15 ENCOUNTER — Telehealth: Payer: Self-pay | Admitting: *Deleted

## 2010-11-15 NOTE — Telephone Encounter (Signed)
Message copied by Tessie Fass on Wed Nov 15, 2010 11:23 AM ------      Message from: De Nurse      Created: Mon Nov 13, 2010  2:46 PM      Regarding: FW: more records                   ----- Message -----         From: Ellin Mayhew         Sent: 11/12/2010   1:39 PM           To: [facility] Admin      Subject: more records                                             Pt seen by Dr. Cyndie Chime at [facility] for anemia workup?  I didn't send this referral and need to know more details.  Can you please request records from Dr. Cyndie Chime for this pt's visit with him.   Also pt seen in pain management during past mont or so..i need those records as well.  Thanks, [employer]

## 2010-11-15 NOTE — Telephone Encounter (Signed)
Left message with medical records to fax notes from pain clinic. Also requested records from Dr Charlies Silvers office.Vilma Meckel, Rodena Medin

## 2010-11-16 ENCOUNTER — Emergency Department (HOSPITAL_COMMUNITY)
Admission: EM | Admit: 2010-11-16 | Discharge: 2010-11-16 | Disposition: A | Discharge status: Home / Self Care | Payer: Medicare Other | Source: Home / Self Care | Attending: Emergency Medicine | Admitting: Emergency Medicine

## 2010-11-16 ENCOUNTER — Ambulatory Visit (HOSPITAL_COMMUNITY)
Admission: RE | Admit: 2010-11-16 | Discharge: 2010-11-16 | Disposition: A | Discharge status: Home / Self Care | Payer: Medicare Other | Source: Ambulatory Visit | Attending: Oncology | Admitting: Oncology

## 2010-11-16 DIAGNOSIS — I279 Pulmonary heart disease, unspecified: Secondary | ICD-10-CM

## 2010-11-16 DIAGNOSIS — F341 Dysthymic disorder: Secondary | ICD-10-CM | POA: Insufficient documentation

## 2010-11-16 DIAGNOSIS — I10 Essential (primary) hypertension: Secondary | ICD-10-CM | POA: Insufficient documentation

## 2010-11-16 DIAGNOSIS — J984 Other disorders of lung: Secondary | ICD-10-CM | POA: Insufficient documentation

## 2010-11-16 DIAGNOSIS — Z86718 Personal history of other venous thrombosis and embolism: Secondary | ICD-10-CM | POA: Insufficient documentation

## 2010-11-16 DIAGNOSIS — M545 Low back pain, unspecified: Secondary | ICD-10-CM | POA: Insufficient documentation

## 2010-11-16 DIAGNOSIS — Z79899 Other long term (current) drug therapy: Secondary | ICD-10-CM | POA: Insufficient documentation

## 2010-11-16 DIAGNOSIS — G8929 Other chronic pain: Secondary | ICD-10-CM | POA: Insufficient documentation

## 2010-11-16 DIAGNOSIS — R609 Edema, unspecified: Secondary | ICD-10-CM | POA: Insufficient documentation

## 2010-11-16 DIAGNOSIS — Z7901 Long term (current) use of anticoagulants: Secondary | ICD-10-CM | POA: Insufficient documentation

## 2010-11-16 DIAGNOSIS — R0789 Other chest pain: Secondary | ICD-10-CM | POA: Insufficient documentation

## 2010-11-16 DIAGNOSIS — I2699 Other pulmonary embolism without acute cor pulmonale: Secondary | ICD-10-CM

## 2010-11-16 DIAGNOSIS — Z7982 Long term (current) use of aspirin: Secondary | ICD-10-CM | POA: Insufficient documentation

## 2010-11-16 DIAGNOSIS — N644 Mastodynia: Secondary | ICD-10-CM | POA: Insufficient documentation

## 2010-11-16 MED ORDER — IOHEXOL 300 MG/ML  SOLN
100.0000 mL | Freq: Once | INTRAMUSCULAR | Status: AC | PRN
  Administered 2010-11-16: 100 mL via INTRAVENOUS

## 2010-11-20 NOTE — Telephone Encounter (Signed)
Kennon Rounds did you see this?

## 2010-11-21 NOTE — Letter (Signed)
Summary: New Patient letter  [facility]  [address] [place], [place] [address]   Phone: [phone]  Fax: [phone]       11/13/2010 MRN: [id number]  [facility] [address] [place], [place]  [address]  [place]  Dear Ms. BELOW,  Welcome to the Gastroenterology Division at [facility].    You are scheduled to see Dr.  Russella Dar on 12-21-10 at 9:30A.M. on the 3rd floor at [facility], [address]  We ask that you try to arrive at our office 15 minutes prior to your appointment time to allow for check-in.  We would like you to complete the enclosed self-administered evaluation form prior to your visit and bring it with you on the day of your appointment.  We will review it with you.  Also, please bring a complete list of all your medications or, if you prefer, bring the medication bottles and we will list them.  Please bring your insurance card so that we may make a copy of it.  If your insurance requires a referral to see a specialist, please bring your referral form from your primary care physician.  Co-payments are due at the time of your visit and may be paid by cash, check or credit card.     Your office visit will consist of a consult with your physician (includes a physical exam), any laboratory testing he/she may order, scheduling of any necessary diagnostic testing (e.g. x-ray, ultrasound, CT-scan), and scheduling of a procedure (e.g. Endoscopy, Colonoscopy) if required.  Please allow enough time on your schedule to allow for any/all of these possibilities.    If you cannot keep your appointment, please call [phone] to cancel or reschedule prior to your appointment date.  This allows [place] the opportunity to schedule an appointment for another patient in need of care.  If you do not cancel or reschedule by 5 p.m. the business day prior to your appointment date, you will be charged a $50.00 late cancellation/no-show fee.    Thank you for choosing  [facility] for your medical needs.  We appreciate the opportunity to care for you.  Please visit [place] at our website  to learn more about our practice.                     Sincerely,                                                             The Gastroenterology Division

## 2010-11-22 ENCOUNTER — Ambulatory Visit: Payer: Medicare Other

## 2010-11-23 ENCOUNTER — Ambulatory Visit (INDEPENDENT_AMBULATORY_CARE_PROVIDER_SITE_OTHER): Payer: Medicare Other | Admitting: *Deleted

## 2010-11-23 DIAGNOSIS — Z7901 Long term (current) use of anticoagulants: Secondary | ICD-10-CM

## 2010-11-23 DIAGNOSIS — I2699 Other pulmonary embolism without acute cor pulmonale: Secondary | ICD-10-CM

## 2010-11-23 LAB — PROTIME-INR: INR: 2.9 — AB (ref 0.9–1.1)

## 2010-11-29 ENCOUNTER — Emergency Department (HOSPITAL_COMMUNITY): Payer: Medicare Other

## 2010-11-29 ENCOUNTER — Emergency Department (HOSPITAL_COMMUNITY)
Admission: EM | Admit: 2010-11-29 | Discharge: 2010-11-29 | Disposition: A | Discharge status: Home / Self Care | Payer: Medicare Other | Attending: Emergency Medicine | Admitting: Emergency Medicine

## 2010-11-29 DIAGNOSIS — I10 Essential (primary) hypertension: Secondary | ICD-10-CM | POA: Insufficient documentation

## 2010-11-29 DIAGNOSIS — W010XXA Fall on same level from slipping, tripping and stumbling without subsequent striking against object, initial encounter: Secondary | ICD-10-CM | POA: Insufficient documentation

## 2010-11-29 DIAGNOSIS — M545 Low back pain, unspecified: Secondary | ICD-10-CM | POA: Insufficient documentation

## 2010-11-29 DIAGNOSIS — M546 Pain in thoracic spine: Secondary | ICD-10-CM | POA: Insufficient documentation

## 2010-11-29 DIAGNOSIS — E669 Obesity, unspecified: Secondary | ICD-10-CM | POA: Insufficient documentation

## 2010-11-29 DIAGNOSIS — M542 Cervicalgia: Secondary | ICD-10-CM | POA: Insufficient documentation

## 2010-11-30 ENCOUNTER — Encounter: Payer: Self-pay | Admitting: Family Medicine

## 2010-11-30 DIAGNOSIS — Z7901 Long term (current) use of anticoagulants: Secondary | ICD-10-CM | POA: Insufficient documentation

## 2010-11-30 DIAGNOSIS — I2699 Other pulmonary embolism without acute cor pulmonale: Secondary | ICD-10-CM | POA: Insufficient documentation

## 2010-12-04 ENCOUNTER — Encounter: Payer: Medicare Other | Admitting: Physical Medicine & Rehabilitation

## 2010-12-04 ENCOUNTER — Encounter: Payer: Medicare Other | Attending: Physical Medicine & Rehabilitation

## 2010-12-04 DIAGNOSIS — M4804 Spinal stenosis, thoracic region: Secondary | ICD-10-CM | POA: Insufficient documentation

## 2010-12-04 DIAGNOSIS — M4802 Spinal stenosis, cervical region: Secondary | ICD-10-CM

## 2010-12-04 DIAGNOSIS — Z86718 Personal history of other venous thrombosis and embolism: Secondary | ICD-10-CM | POA: Insufficient documentation

## 2010-12-04 DIAGNOSIS — Z7901 Long term (current) use of anticoagulants: Secondary | ICD-10-CM | POA: Insufficient documentation

## 2010-12-04 DIAGNOSIS — M549 Dorsalgia, unspecified: Secondary | ICD-10-CM | POA: Insufficient documentation

## 2010-12-04 DIAGNOSIS — M961 Postlaminectomy syndrome, not elsewhere classified: Secondary | ICD-10-CM

## 2010-12-06 ENCOUNTER — Ambulatory Visit: Payer: Medicare Other

## 2010-12-06 LAB — PROTIME-INR
INR: 1.63 — ABNORMAL HIGH (ref 0.00–1.49)
Prothrombin Time: 19.5 seconds — ABNORMAL HIGH (ref 11.6–15.2)

## 2010-12-06 LAB — POCT I-STAT, CHEM 8
BUN: 8 mg/dL (ref 6–23)
Calcium, Ion: 1.16 mmol/L (ref 1.12–1.32)
Chloride: 99 mEq/L (ref 96–112)
Glucose, Bld: 122 mg/dL — ABNORMAL HIGH (ref 70–99)
TCO2: 32 mmol/L (ref 0–100)

## 2010-12-07 LAB — CBC
HCT: 28.3 % — ABNORMAL LOW (ref 36.0–46.0)
MCH: 28.9 pg (ref 26.0–34.0)
MCH: 29 pg (ref 26.0–34.0)
MCHC: 32.4 g/dL (ref 30.0–36.0)
MCV: 89.3 fL (ref 78.0–100.0)
MCV: 90.1 fL (ref 78.0–100.0)
Platelets: 242 10*3/uL (ref 150–400)
RBC: 3.18 MIL/uL — ABNORMAL LOW (ref 3.87–5.11)
RDW: 16.2 % — ABNORMAL HIGH (ref 11.5–15.5)
RDW: 16.4 % — ABNORMAL HIGH (ref 11.5–15.5)
WBC: 4.8 10*3/uL (ref 4.0–10.5)

## 2010-12-07 LAB — BASIC METABOLIC PANEL
BUN: 12 mg/dL (ref 6–23)
BUN: 7 mg/dL (ref 6–23)
CO2: 30 mEq/L (ref 19–32)
Calcium: 8.7 mg/dL (ref 8.4–10.5)
Calcium: 8.8 mg/dL (ref 8.4–10.5)
Chloride: 103 mEq/L (ref 96–112)
Chloride: 99 mEq/L (ref 96–112)
Creatinine, Ser: 0.76 mg/dL (ref 0.4–1.2)
Creatinine, Ser: 0.97 mg/dL (ref 0.4–1.2)
GFR calc Af Amer: >60 mL/min (ref >60)
GFR calc Af Amer: >60 mL/min (ref >60)

## 2010-12-07 LAB — PROTIME-INR
INR: 2.37 — ABNORMAL HIGH (ref 0.00–1.49)
Prothrombin Time: 26 seconds — ABNORMAL HIGH (ref 11.6–15.2)

## 2010-12-07 LAB — DIFFERENTIAL
Basophils Absolute: 0 10*3/uL (ref 0.0–0.1)
Basophils Relative: 0 % (ref 0–1)
Eosinophils Absolute: 0.3 10*3/uL (ref 0.0–0.7)
Eosinophils Relative: 4 % (ref 0–5)
Neutrophils Relative %: 61 % (ref 43–77)

## 2010-12-07 LAB — POCT CARDIAC MARKERS
CKMB, poc: 29.3 ng/mL (ref 1.0–8.0)
Myoglobin, poc: 289 ng/mL (ref 12–200)
Troponin i, poc: <0.05 ng/mL (ref 0.00–0.09)

## 2010-12-07 LAB — CARDIAC PANEL(CRET KIN+CKTOT+MB+TROPI): Troponin I: 0.03 ng/mL (ref 0.00–0.06)

## 2010-12-07 LAB — CK TOTAL AND CKMB (NOT AT ARMC): CK, MB: 36.7 ng/mL (ref 0.3–4.0)

## 2010-12-07 LAB — D-DIMER, QUANTITATIVE: D-Dimer, Quant: 0.49 ug/mL-FEU — ABNORMAL HIGH (ref 0.00–0.48)

## 2010-12-08 ENCOUNTER — Emergency Department (HOSPITAL_COMMUNITY)
Admission: EM | Admit: 2010-12-08 | Discharge: 2010-12-08 | Disposition: A | Discharge status: Home / Self Care | Payer: Medicare Other | Attending: Emergency Medicine | Admitting: Emergency Medicine

## 2010-12-08 ENCOUNTER — Inpatient Hospital Stay (INDEPENDENT_AMBULATORY_CARE_PROVIDER_SITE_OTHER)
Admission: RE | Admit: 2010-12-08 | Discharge: 2010-12-08 | Disposition: A | Discharge status: Home / Self Care | Payer: Medicare Other | Source: Ambulatory Visit | Attending: Family Medicine | Admitting: Family Medicine

## 2010-12-08 ENCOUNTER — Ambulatory Visit (INDEPENDENT_AMBULATORY_CARE_PROVIDER_SITE_OTHER): Payer: Medicare Other

## 2010-12-08 DIAGNOSIS — S60229A Contusion of unspecified hand, initial encounter: Secondary | ICD-10-CM | POA: Insufficient documentation

## 2010-12-08 DIAGNOSIS — Z7901 Long term (current) use of anticoagulants: Secondary | ICD-10-CM | POA: Insufficient documentation

## 2010-12-08 DIAGNOSIS — Z79899 Other long term (current) drug therapy: Secondary | ICD-10-CM | POA: Insufficient documentation

## 2010-12-08 DIAGNOSIS — Z86718 Personal history of other venous thrombosis and embolism: Secondary | ICD-10-CM | POA: Insufficient documentation

## 2010-12-08 DIAGNOSIS — I10 Essential (primary) hypertension: Secondary | ICD-10-CM | POA: Insufficient documentation

## 2010-12-08 DIAGNOSIS — M79609 Pain in unspecified limb: Secondary | ICD-10-CM | POA: Insufficient documentation

## 2010-12-08 DIAGNOSIS — W1809XA Striking against other object with subsequent fall, initial encounter: Secondary | ICD-10-CM | POA: Insufficient documentation

## 2010-12-08 DIAGNOSIS — F341 Dysthymic disorder: Secondary | ICD-10-CM | POA: Insufficient documentation

## 2010-12-08 DIAGNOSIS — Y9229 Other specified public building as the place of occurrence of the external cause: Secondary | ICD-10-CM | POA: Insufficient documentation

## 2010-12-08 DIAGNOSIS — M2550 Pain in unspecified joint: Secondary | ICD-10-CM | POA: Insufficient documentation

## 2010-12-08 LAB — POCT URINALYSIS DIPSTICK
Bilirubin Urine: NEGATIVE
Ketones, ur: NEGATIVE mg/dL
Specific Gravity, Urine: 1.015 (ref 1.005–1.030)
pH: 7.5 (ref 5.0–8.0)

## 2010-12-08 LAB — COMPREHENSIVE METABOLIC PANEL
ALT: 12 U/L (ref 0–35)
AST: 18 U/L (ref 0–37)
CO2: 34 mEq/L — ABNORMAL HIGH (ref 19–32)
Chloride: 99 mEq/L (ref 96–112)
GFR calc Af Amer: >60 mL/min (ref >60)
GFR calc non Af Amer: >60 mL/min (ref >60)
Sodium: 140 mEq/L (ref 135–145)
Total Bilirubin: 0.2 mg/dL — ABNORMAL LOW (ref 0.3–1.2)

## 2010-12-08 LAB — POCT I-STAT, CHEM 8
BUN: 10 mg/dL (ref 6–23)
Chloride: 98 mEq/L (ref 96–112)
Creatinine, Ser: 1 mg/dL (ref 0.4–1.2)
Potassium: 3.8 mEq/L (ref 3.5–5.1)
Sodium: 138 mEq/L (ref 135–145)

## 2010-12-08 LAB — CBC
HCT: 29.9 % — ABNORMAL LOW (ref 36.0–46.0)
Hemoglobin: 9.7 g/dL — ABNORMAL LOW (ref 12.0–15.0)
Hemoglobin: 9.7 g/dL — ABNORMAL LOW (ref 12.0–15.0)
MCH: 29.1 pg (ref 26.0–34.0)
MCV: 89.8 fL (ref 78.0–100.0)
RBC: 3.33 MIL/uL — ABNORMAL LOW (ref 3.87–5.11)
RBC: 3.33 MIL/uL — ABNORMAL LOW (ref 3.87–5.11)

## 2010-12-08 LAB — DIFFERENTIAL
Basophils Absolute: 0 10*3/uL (ref 0.0–0.1)
Eosinophils Absolute: 0.2 10*3/uL (ref 0.0–0.7)
Eosinophils Relative: 4 % (ref 0–5)
Lymphs Abs: 1.4 10*3/uL (ref 0.7–4.0)
Monocytes Relative: 6 % (ref 3–12)
Neutro Abs: 3.5 10*3/uL (ref 1.7–7.7)
Neutrophils Relative %: 64 % (ref 43–77)

## 2010-12-08 LAB — PROTIME-INR
INR: 2.32 — ABNORMAL HIGH (ref 0.00–1.49)
Prothrombin Time: 25.6 seconds — ABNORMAL HIGH (ref 11.6–15.2)
Prothrombin Time: 35.8 seconds — ABNORMAL HIGH (ref 11.6–15.2)

## 2010-12-10 LAB — URINALYSIS, ROUTINE W REFLEX MICROSCOPIC
Bilirubin Urine: NEGATIVE
Nitrite: NEGATIVE
Specific Gravity, Urine: 1.019 (ref 1.005–1.030)
Urobilinogen, UA: 0.2 mg/dL (ref 0.0–1.0)
pH: 7.5 (ref 5.0–8.0)

## 2010-12-10 LAB — APTT
aPTT: 58 seconds — ABNORMAL HIGH (ref 24–37)
aPTT: 67 seconds — ABNORMAL HIGH (ref 24–37)

## 2010-12-10 LAB — CBC
MCHC: 32.3 g/dL (ref 30.0–36.0)
MCV: 90.9 fL (ref 78.0–100.0)
Platelets: 326 10*3/uL (ref 150–400)
RDW: 19.6 % — ABNORMAL HIGH (ref 11.5–15.5)
WBC: 4.8 10*3/uL (ref 4.0–10.5)

## 2010-12-10 LAB — DIFFERENTIAL
Basophils Relative: 1 % (ref 0–1)
Lymphs Abs: 1 10*3/uL (ref 0.7–4.0)
Monocytes Absolute: 0.3 10*3/uL (ref 0.1–1.0)
Monocytes Relative: 7 % (ref 3–12)
Neutro Abs: 3.3 10*3/uL (ref 1.7–7.7)
Neutrophils Relative %: 68 % (ref 43–77)

## 2010-12-10 LAB — BLOOD GAS, ARTERIAL
Bicarbonate: 28 mEq/L — ABNORMAL HIGH (ref 20.0–24.0)
Drawn by: 213381
FIO2: 0.21 %
pCO2 arterial: 38.9 mmHg (ref 35.0–45.0)
pH, Arterial: 7.471 — ABNORMAL HIGH (ref 7.350–7.400)
pO2, Arterial: 104 mmHg — ABNORMAL HIGH (ref 80.0–100.0)

## 2010-12-10 LAB — COMPREHENSIVE METABOLIC PANEL
AST: 26 U/L (ref 0–37)
Albumin: 3.4 g/dL — ABNORMAL LOW (ref 3.5–5.2)
Alkaline Phosphatase: 72 U/L (ref 39–117)
Alkaline Phosphatase: 89 U/L (ref 39–117)
BUN: 1 mg/dL — ABNORMAL LOW (ref 6–23)
BUN: 5 mg/dL — ABNORMAL LOW (ref 6–23)
CO2: 30 mEq/L (ref 19–32)
Calcium: 9.3 mg/dL (ref 8.4–10.5)
Chloride: 105 mEq/L (ref 96–112)
Creatinine, Ser: 0.72 mg/dL (ref 0.4–1.2)
GFR calc Af Amer: >60 mL/min (ref >60)
GFR calc non Af Amer: >60 mL/min (ref >60)
Glucose, Bld: 99 mg/dL (ref 70–99)
Potassium: 3.8 mEq/L (ref 3.5–5.1)
Sodium: 139 mEq/L (ref 135–145)
Total Bilirubin: 0.6 mg/dL (ref 0.3–1.2)
Total Protein: 8 g/dL (ref 6.0–8.3)

## 2010-12-10 LAB — PROTIME-INR
INR: 1.4 (ref 0.00–1.49)
INR: 1.65 — ABNORMAL HIGH (ref 0.00–1.49)
INR: 5.05 (ref 0.00–1.49)
INR: 5.62 (ref 0.00–1.49)
Prothrombin Time: 17 seconds — ABNORMAL HIGH (ref 11.6–15.2)
Prothrombin Time: 19.4 seconds — ABNORMAL HIGH (ref 11.6–15.2)
Prothrombin Time: 46.4 seconds — ABNORMAL HIGH (ref 11.6–15.2)
Prothrombin Time: 50.5 seconds — ABNORMAL HIGH (ref 11.6–15.2)

## 2010-12-10 LAB — HEPATIC FUNCTION PANEL
Bilirubin, Direct: <0.1 mg/dL (ref 0.0–0.3)
Total Bilirubin: 0.2 mg/dL — ABNORMAL LOW (ref 0.3–1.2)

## 2010-12-10 LAB — ACETAMINOPHEN LEVEL: Acetaminophen (Tylenol), Serum: <10 ug/mL — ABNORMAL LOW (ref 10–30)

## 2010-12-10 LAB — VITAMIN B12: Vitamin B-12: 725 pg/mL (ref 211–911)

## 2010-12-10 LAB — FOLATE: Folate: >20 ng/mL

## 2010-12-10 LAB — GLUCOSE, CAPILLARY: Glucose-Capillary: 99 mg/dL (ref 70–99)

## 2010-12-10 LAB — RETICULOCYTES
RBC.: 3.71 MIL/uL — ABNORMAL LOW (ref 3.87–5.11)
Retic Ct Pct: 1 % (ref 0.4–3.1)

## 2010-12-10 LAB — ETHANOL: Alcohol, Ethyl (B): <5 mg/dL (ref 0–10)

## 2010-12-10 LAB — URINE CULTURE

## 2010-12-10 LAB — URINE MICROSCOPIC-ADD ON

## 2010-12-10 LAB — IRON AND TIBC: UIBC: 331 ug/dL

## 2010-12-10 LAB — SALICYLATE LEVEL: Salicylate Lvl: <4 mg/dL (ref 2.8–20.0)

## 2010-12-10 LAB — RAPID URINE DRUG SCREEN, HOSP PERFORMED
Opiates: POSITIVE — AB
Tetrahydrocannabinol: NOT DETECTED

## 2010-12-11 ENCOUNTER — Emergency Department (HOSPITAL_COMMUNITY): Payer: Medicare Other

## 2010-12-11 ENCOUNTER — Emergency Department (HOSPITAL_COMMUNITY)
Admission: EM | Admit: 2010-12-11 | Discharge: 2010-12-11 | Disposition: A | Discharge status: Home / Self Care | Payer: Medicare Other | Attending: Emergency Medicine | Admitting: Emergency Medicine

## 2010-12-11 DIAGNOSIS — K5289 Other specified noninfective gastroenteritis and colitis: Secondary | ICD-10-CM | POA: Insufficient documentation

## 2010-12-11 DIAGNOSIS — I10 Essential (primary) hypertension: Secondary | ICD-10-CM | POA: Insufficient documentation

## 2010-12-11 DIAGNOSIS — N390 Urinary tract infection, site not specified: Secondary | ICD-10-CM | POA: Insufficient documentation

## 2010-12-11 DIAGNOSIS — R509 Fever, unspecified: Secondary | ICD-10-CM | POA: Insufficient documentation

## 2010-12-11 DIAGNOSIS — R12 Heartburn: Secondary | ICD-10-CM | POA: Insufficient documentation

## 2010-12-11 LAB — CBC
Hemoglobin: 11.1 g/dL — ABNORMAL LOW (ref 12.0–15.0)
MCV: 90.1 fL (ref 78.0–100.0)
Platelets: 243 10*3/uL (ref 150–400)
RBC: 3.82 MIL/uL — ABNORMAL LOW (ref 3.87–5.11)
WBC: 7.7 10*3/uL (ref 4.0–10.5)

## 2010-12-11 LAB — URINALYSIS, ROUTINE W REFLEX MICROSCOPIC
Bilirubin Urine: NEGATIVE
Glucose, UA: NEGATIVE mg/dL
Ketones, ur: NEGATIVE mg/dL
Nitrite: NEGATIVE
Specific Gravity, Urine: 1.018 (ref 1.005–1.030)
Urobilinogen, UA: 0.2 mg/dL (ref 0.0–1.0)
pH: 7.5 (ref 5.0–8.0)

## 2010-12-11 LAB — URINE MICROSCOPIC-ADD ON

## 2010-12-11 LAB — DIFFERENTIAL
Basophils Relative: 0 % (ref 0–1)
Eosinophils Absolute: 0.2 10*3/uL (ref 0.0–0.7)
Lymphs Abs: 0.4 10*3/uL — ABNORMAL LOW (ref 0.7–4.0)
Neutro Abs: 6.8 10*3/uL (ref 1.7–7.7)
Neutrophils Relative %: 89 % — ABNORMAL HIGH (ref 43–77)

## 2010-12-11 LAB — BASIC METABOLIC PANEL
CO2: 30 mEq/L (ref 19–32)
Chloride: 97 mEq/L (ref 96–112)
GFR calc Af Amer: >60 mL/min (ref >60)
Potassium: 3.3 mEq/L — ABNORMAL LOW (ref 3.5–5.1)

## 2010-12-11 LAB — GLUCOSE, CAPILLARY: Glucose-Capillary: 138 mg/dL — ABNORMAL HIGH (ref 70–99)

## 2010-12-12 LAB — POCT I-STAT, CHEM 8
BUN: 12 mg/dL (ref 6–23)
Calcium, Ion: 1.18 mmol/L (ref 1.12–1.32)
HCT: 34 % — ABNORMAL LOW (ref 36.0–46.0)
Hemoglobin: 11.6 g/dL — ABNORMAL LOW (ref 12.0–15.0)
TCO2: 33 mmol/L (ref 0–100)

## 2010-12-12 LAB — HEPARIN LEVEL (UNFRACTIONATED)
Heparin Unfractionated: 0.89 IU/mL — ABNORMAL HIGH (ref 0.30–0.70)
Heparin Unfractionated: 0.96 IU/mL — ABNORMAL HIGH (ref 0.30–0.70)

## 2010-12-12 LAB — BASIC METABOLIC PANEL
BUN: 11 mg/dL (ref 6–23)
BUN: 14 mg/dL (ref 6–23)
CO2: 26 mEq/L (ref 19–32)
CO2: 28 mEq/L (ref 19–32)
CO2: 28 mEq/L (ref 19–32)
Calcium: 9.1 mg/dL (ref 8.4–10.5)
Calcium: 9.6 mg/dL (ref 8.4–10.5)
Chloride: 98 mEq/L (ref 96–112)
Creatinine, Ser: 0.9 mg/dL (ref 0.4–1.2)
Creatinine, Ser: 0.92 mg/dL (ref 0.4–1.2)
Creatinine, Ser: 0.99 mg/dL (ref 0.4–1.2)
GFR calc Af Amer: >60 mL/min (ref >60)
GFR calc Af Amer: >60 mL/min (ref >60)
GFR calc Af Amer: >60 mL/min (ref >60)
GFR calc non Af Amer: >60 mL/min (ref >60)
Glucose, Bld: 126 mg/dL — ABNORMAL HIGH (ref 70–99)
Glucose, Bld: 144 mg/dL — ABNORMAL HIGH (ref 70–99)
Potassium: 4 mEq/L (ref 3.5–5.1)
Potassium: 4 mEq/L (ref 3.5–5.1)
Sodium: 133 mEq/L — ABNORMAL LOW (ref 135–145)

## 2010-12-12 LAB — CBC
HCT: 31.1 % — ABNORMAL LOW (ref 36.0–46.0)
HCT: 34.5 % — ABNORMAL LOW (ref 36.0–46.0)
Hemoglobin: 11.7 g/dL — ABNORMAL LOW (ref 12.0–15.0)
Hemoglobin: 11.9 g/dL — ABNORMAL LOW (ref 12.0–15.0)
MCHC: 33 g/dL (ref 30.0–36.0)
MCHC: 33.5 g/dL (ref 30.0–36.0)
MCHC: 33.8 g/dL (ref 30.0–36.0)
MCHC: 34 g/dL (ref 30.0–36.0)
MCV: 87.3 fL (ref 78.0–100.0)
MCV: 87.4 fL (ref 78.0–100.0)
MCV: 88 fL (ref 78.0–100.0)
Platelets: 183 10*3/uL (ref 150–400)
Platelets: 191 10*3/uL (ref 150–400)
Platelets: 219 10*3/uL (ref 150–400)
Platelets: 238 10*3/uL (ref 150–400)
RBC: 3.29 MIL/uL — ABNORMAL LOW (ref 3.87–5.11)
RBC: 3.96 MIL/uL (ref 3.87–5.11)
RBC: 4.15 MIL/uL (ref 3.87–5.11)
RDW: 20.3 % — ABNORMAL HIGH (ref 11.5–15.5)
RDW: 20.5 % — ABNORMAL HIGH (ref 11.5–15.5)
WBC: 9 10*3/uL (ref 4.0–10.5)
WBC: 9.9 10*3/uL (ref 4.0–10.5)

## 2010-12-12 LAB — PROTIME-INR
INR: 1.21 (ref 0.00–1.49)
INR: 1.51 — ABNORMAL HIGH (ref 0.00–1.49)
Prothrombin Time: 15.1 seconds (ref 11.6–15.2)
Prothrombin Time: 15.2 seconds (ref 11.6–15.2)
Prothrombin Time: 18.1 seconds — ABNORMAL HIGH (ref 11.6–15.2)

## 2010-12-12 LAB — DIFFERENTIAL
Basophils Absolute: 0 10*3/uL (ref 0.0–0.1)
Lymphocytes Relative: 16 % (ref 12–46)
Monocytes Absolute: 0.2 10*3/uL (ref 0.1–1.0)
Neutro Abs: 8.7 10*3/uL — ABNORMAL HIGH (ref 1.7–7.7)

## 2010-12-12 LAB — COMPREHENSIVE METABOLIC PANEL
Albumin: 3.8 g/dL (ref 3.5–5.2)
BUN: 12 mg/dL (ref 6–23)
Calcium: 9.5 mg/dL (ref 8.4–10.5)
Chloride: 101 mEq/L (ref 96–112)
Creatinine, Ser: 1.06 mg/dL (ref 0.4–1.2)
Total Bilirubin: 0.2 mg/dL — ABNORMAL LOW (ref 0.3–1.2)

## 2010-12-12 LAB — CARDIAC PANEL(CRET KIN+CKTOT+MB+TROPI)
CK, MB: 3.7 ng/mL (ref 0.3–4.0)
Relative Index: 1.1 (ref 0.0–2.5)
Total CK: 374 U/L — ABNORMAL HIGH (ref 7–177)

## 2010-12-12 LAB — D-DIMER, QUANTITATIVE: D-Dimer, Quant: 2.41 ug/mL-FEU — ABNORMAL HIGH (ref 0.00–0.48)

## 2010-12-13 LAB — URINALYSIS, ROUTINE W REFLEX MICROSCOPIC
Glucose, UA: NEGATIVE mg/dL
Protein, ur: 30 mg/dL — AB
Urobilinogen, UA: 1 mg/dL (ref 0.0–1.0)

## 2010-12-13 LAB — URINE MICROSCOPIC-ADD ON

## 2010-12-14 ENCOUNTER — Ambulatory Visit (INDEPENDENT_AMBULATORY_CARE_PROVIDER_SITE_OTHER): Payer: Medicare Other | Admitting: *Deleted

## 2010-12-14 ENCOUNTER — Encounter: Payer: Self-pay | Admitting: Family Medicine

## 2010-12-14 ENCOUNTER — Ambulatory Visit (INDEPENDENT_AMBULATORY_CARE_PROVIDER_SITE_OTHER): Payer: Medicare Other | Admitting: Family Medicine

## 2010-12-14 DIAGNOSIS — I2699 Other pulmonary embolism without acute cor pulmonale: Secondary | ICD-10-CM

## 2010-12-14 DIAGNOSIS — J984 Other disorders of lung: Secondary | ICD-10-CM

## 2010-12-14 DIAGNOSIS — I83813 Varicose veins of bilateral lower extremities with pain: Secondary | ICD-10-CM

## 2010-12-14 DIAGNOSIS — F209 Schizophrenia, unspecified: Secondary | ICD-10-CM

## 2010-12-14 DIAGNOSIS — Z7901 Long term (current) use of anticoagulants: Secondary | ICD-10-CM

## 2010-12-14 DIAGNOSIS — G8929 Other chronic pain: Secondary | ICD-10-CM

## 2010-12-14 DIAGNOSIS — I10 Essential (primary) hypertension: Secondary | ICD-10-CM

## 2010-12-14 DIAGNOSIS — R911 Solitary pulmonary nodule: Secondary | ICD-10-CM

## 2010-12-14 DIAGNOSIS — I83893 Varicose veins of bilateral lower extremities with other complications: Secondary | ICD-10-CM

## 2010-12-14 NOTE — Patient Instructions (Signed)
Please sign release of information for [facility] imaging so we can see their recommendations about your legs and varicose veins.  Please sign release of information so I can get records from [facility].  Return in 1 month for recheck appointment.

## 2010-12-15 ENCOUNTER — Encounter: Payer: Self-pay | Admitting: Family Medicine

## 2010-12-15 DIAGNOSIS — I83813 Varicose veins of bilateral lower extremities with pain: Secondary | ICD-10-CM | POA: Insufficient documentation

## 2010-12-15 NOTE — Assessment & Plan Note (Signed)
11/16/10-repeat CT scan showed Grossly clear lungs.  Prior 4 mm right upper lobe pulmonary nodule  is not visualized, and may have been infectious/inflammatory.

## 2010-12-15 NOTE — Assessment & Plan Note (Signed)
Pt states that this has been evaluated and had some type of treatment by outside provider at [facility]? I have requested these records.

## 2010-12-15 NOTE — Assessment & Plan Note (Signed)
Pt to see [name] [name] on 4/30.  I have requested copy of most recent [facility] care plan.

## 2010-12-15 NOTE — Assessment & Plan Note (Signed)
On 12/14/10- BP 132/78- pt bp seems to be wnl when compliant with bp medication regimen

## 2010-12-15 NOTE — Assessment & Plan Note (Signed)
Continue coumadin- it appears that pt will need to be on this life long.  See consult note from [place].

## 2010-12-15 NOTE — Progress Notes (Signed)
  Subjective:    Patient ID: [name] [name], female    DOB: 1941/09/07, 70 y.o.   MRN: [id number]  HPI Pt here for f/up appointment:  HTN: Pt states that she has been taking meds as directed.  bp wnl today.  Will continue to follow  Mental health: Pt states that she is doing well.  Is being followed by [facility].  No SI Or HI.  Pain management: States that she didn't like the tramadol 100mg .  She felt like the 50mg  worked better.  These are being prescribed by pain management.  Reports that she continues to have pain.   Pain in varicose veins: Bilateral pain in lower legs, pt states 2/2 varicose veins, states she has had evaluation of this in past with [facility] but doesn't remember what they said about treatment options and follow up.  States she will sign roi for these records.  [facility]: goal of 1 pharmacy and 1 physician: Pt expresses frustration that [facility] asked her to pick 1 pharmacy and them only.  Pt states that she is no longer going to [facility] and that she would like to stay at [facility].  Will have pt sign ROI form to get records.     Review of Systems    no fever. No n/v/d. + lower back pain. No h/a. No blurry vision. No falls. Objective:   Physical Exam  Constitutional: She is oriented to person, place, and time. She appears well-developed and well-nourished.       obese  HENT:  Head: Normocephalic.  Eyes: Pupils are equal, round, and reactive to light.  Cardiovascular: Normal rate, regular rhythm and normal heart sounds.   Pulmonary/Chest: Effort normal and breath sounds normal. No respiratory distress. She has no wheezes.  Abdominal: Soft. She exhibits no distension.  Musculoskeletal: She exhibits no edema.       Walks with cane, varicose veins in lower extremities bilateral. Pain with palpation over veins bilateral.  Neurological: She is alert and oriented to person, place, and time.  Skin: Skin is warm and dry.    Psychiatric: She has a normal mood and affect. Her behavior is normal.          Assessment & Plan:

## 2010-12-15 NOTE — Assessment & Plan Note (Addendum)
Focused on pt's concerns at this appointment and did not mention as I had previously planned about pt getting percocet from outside provider.  This occured x2 during jan. On further eval of outside med refills found that pt had 1 dispense of percocet in both Feb and in March.  Pt is being seen by pain management but per pt they are only prescribing tramadol.  Need to discuss in detail with pt at next visit.  Pt now is using only 1 pharmacy - [facility].

## 2010-12-21 ENCOUNTER — Encounter: Payer: Self-pay | Admitting: Gastroenterology

## 2010-12-21 ENCOUNTER — Other Ambulatory Visit: Payer: Self-pay | Admitting: Family Medicine

## 2010-12-21 ENCOUNTER — Ambulatory Visit (INDEPENDENT_AMBULATORY_CARE_PROVIDER_SITE_OTHER): Payer: Medicare Other | Admitting: Gastroenterology

## 2010-12-21 VITALS — BP 138/80 | HR 68 | Ht 65.0 in | Wt 239.0 lb

## 2010-12-21 DIAGNOSIS — K219 Gastro-esophageal reflux disease without esophagitis: Secondary | ICD-10-CM

## 2010-12-21 DIAGNOSIS — I2699 Other pulmonary embolism without acute cor pulmonale: Secondary | ICD-10-CM

## 2010-12-21 DIAGNOSIS — Z800 Family history of malignant neoplasm of digestive organs: Secondary | ICD-10-CM

## 2010-12-21 DIAGNOSIS — D509 Iron deficiency anemia, unspecified: Secondary | ICD-10-CM

## 2010-12-21 MED ORDER — FERROUS SULFATE 325 (65 FE) MG PO TABS: 325.0000 mg | ORAL_TABLET | Freq: Two times a day (BID) | ORAL | Status: DC

## 2010-12-21 MED ORDER — PEG-KCL-NACL-NASULF-NA ASC-C 100 G PO SOLR: 1.0000 | Freq: Once | ORAL | Status: AC

## 2010-12-21 NOTE — Assessment & Plan Note (Signed)
GERD with symptoms well controlled on Nexium. Continue Nexium and follow standard antireflux measures.

## 2010-12-21 NOTE — Assessment & Plan Note (Signed)
Strong family history of colon cancer with a brother diagnosed in his 24s and another brother diagnosed in his 31s. She has intermittent mild constipation and occasional small-volume hematochezia which is likely to be hemorrhoidal. The risks, benefits, and alternatives to colonoscopy with possible biopsy and possible polypectomy were discussed with the patient and they consent to proceed.

## 2010-12-21 NOTE — Assessment & Plan Note (Signed)
History of pulmonary embolism in April 2011 maintained on Coumadin anticoagulation. The risks, benefits, and alternatives to a 5 day hold with Coumadin for colonoscopy was discussed with the patient and she consents to proceed. Will obtain clearance from her primary physician.

## 2010-12-21 NOTE — Telephone Encounter (Signed)
Refill request

## 2010-12-21 NOTE — Progress Notes (Signed)
History of Present Illness: This is a 70 year old female with multiple comorbidities and a strong family history of colon cancer. She states she had 2 brothers who passed away from colon cancer, one in his 9s and one in his 44s. She relates that she had a sigmoidoscopy performed at [facility] in [place] approximately 10 years ago and she does not recall any problems noted. She was recently found to have a normocytic female with a hemoglobin of 10 point or an MCV 87.7. Recent iron studies showed a saturation of 13% with an iron of 50 and TIBC of 398. Ferritin was 18. She relates frequent problems with bloating and gas as well as reflux symptoms that are well-controlled with the regular use of Nexium. She has intermittent mild constipation and occasionally notes small amounts of hematochezia when constipated. She has occasional mild abdominal pain that occurs when she has flare of her back pain. She is maintained on Coumadin with a history of pulmonary emboli in 2011. She has schizophrenia depression and chronic pain and is maintained on Klonopin, fluoxetine and tramadol.  Past Medical History  Diagnosis Date  . Depression   . Schizophrenia   . Arthritis   . Hyperlipidemia   . Hypertension   . Chronic pain   . Pulmonary embolism   . DVT (deep venous thrombosis)     per 01/17/10 d/c summary- "remote hx of dvt"?  . Iron deficiency anemia   . Chronic headaches   . Sleep apnea   . Glaucoma   . Hemorrhoids   . Asthma   . Anxiety    Past Surgical History  Procedure Date  . Spinal fusion     2004  . Joint replacement right knee 11/2008  . Tubal ligation year 21  . Carpal tunnel release     bilateral     reports that she has never smoked. She has never used smokeless tobacco. She reports that she does not drink alcohol or use illicit drugs. family history includes Colon cancer in her brother and Diabetes in her sister. No Known Allergies  Outpatient Encounter Prescriptions as of  12/21/2010  Medication Sig Dispense Refill  . albuterol (VENTOLIN HFA) 108 (90 BASE) MCG/ACT inhaler Inhale 2 puffs into the lungs every 4 (four) hours as needed. For shortness of breath or wheezing.       Marland Kitchen atenolol (TENORMIN) 100 MG tablet Take 100 mg by mouth daily.        . clonazePAM (KLONOPIN) 0.5 MG tablet Take 0.5 mg by mouth 2 (two) times daily.        Marland Kitchen esomeprazole (NEXIUM) 40 MG capsule Take 40 mg by mouth daily. For reflux       . FLUoxetine (PROZAC) 20 MG tablet 3 tabs by mouth every morning       . fluticasone (FLOVENT HFA) 220 MCG/ACT inhaler Inhale 1 puff into the lungs 2 (two) times daily.        . hydrochlorothiazide 25 MG tablet Take 25 mg by mouth daily. For high blood pressure       . meclizine (ANTIVERT) 25 MG tablet Take 25 mg by mouth every 8 (eight) hours as needed. For dizziness       . potassium chloride (KLOR-CON) 10 MEQ CR tablet Take 2 tabs by mouth daily.       . traMADol (ULTRAM) 50 MG tablet Take 50 mg by mouth 3 (three) times daily as needed. For pain       .  warfarin (COUMADIN) 5 MG tablet as directed.        . peg 3350 electrolyte powder (MOVIPREP) 100 G SOLR Take 1 kit (100 g total) by mouth once.  1 kit  0     Review of Systems: Remarkable for arthritis back pain depression but he headaches itching night sweats sleeping problems and swelling in her feet and legs Pertinent positive and negative review of systems were noted in the above HPI section. All other review of systems were otherwise negative.     Physical Exam: General: Well developed , well nourished, no acute distress. Obese Head: Normocephalic and atraumatic Eyes:  sclerae anicteric, EOMI Ears: Normal auditory acuity Mouth: No deformity or lesions Neck: Supple, no masses or thyromegaly Lungs: Clear throughout to auscultation Heart: Regular rate and rhythm; no murmurs, rubs or bruits Abdomen: Soft, non tender and non distended. No masses, hepatosplenomegaly or hernias noted. Normal Bowel  sounds Rectal: Deferred to colonoscopy Musculoskeletal: Symmetrical with no gross deformities  Skin: No lesions on visible extremities Pulses:  Normal pulses noted Extremities: No clubbing, cyanosis, edema or deformities noted Neurological: Alert oriented x 4, grossly nonfocal Cervical Nodes:  No significant cervical adenopathy Inguinal Nodes: No significant inguinal adenopathy Psychological:  Alert and cooperative. Depressed affect  Assessment and Recommendations:

## 2010-12-21 NOTE — Patient Instructions (Addendum)
You have been scheduled for a Upper Endoscopy/ Colonoscopy with propofol. We will contact you about your warfarin clearance after we receive confirmation from Dr. Edmonia James.  Patient advised to avoid spicy, acidic, citrus, chocolate, mints, fruit and fruit juices.  Limit the intake of caffeine, alcohol and Soda.  Don't exercise too soon after eating.  Don't lie down within 3-4 hours of eating.  Elevate the head of your bed. A prescription for Iron supplement has been sent to your pharmacy to take twice a day with meals.  Cc: Cephas Darby, MD        Ellin Mayhew, MD

## 2010-12-21 NOTE — Assessment & Plan Note (Addendum)
Normocytic anemia with a hemoglobin of 10.4 and an iron saturation of 13%. Begin iron replacement. Further monitoring of her anemia per her primary physician. Rule out occult gastrointestinal blood loss. Further evaluation with colonoscopy as outlined. Schedule EGD.  The risks, benefits, and alternatives to endoscopy with possible biopsy and possible dilation were discussed with the patient and they consent to proceed.

## 2010-12-22 ENCOUNTER — Telehealth (INDEPENDENT_AMBULATORY_CARE_PROVIDER_SITE_OTHER): Payer: Medicare Other | Admitting: Family Medicine

## 2010-12-22 DIAGNOSIS — G8929 Other chronic pain: Secondary | ICD-10-CM

## 2010-12-22 MED ORDER — TRAMADOL HCL 50 MG PO TABS: 50.0000 mg | ORAL_TABLET | Freq: Three times a day (TID) | ORAL | Status: DC | PRN

## 2010-12-22 NOTE — Telephone Encounter (Signed)
States that pharmacy has been faxing refill on tramadol 150mg  [facility]

## 2010-12-22 NOTE — Telephone Encounter (Signed)
Refill request for [name] [name]' patient who is inpatient attending today.

## 2010-12-25 ENCOUNTER — Encounter (HOSPITAL_BASED_OUTPATIENT_CLINIC_OR_DEPARTMENT_OTHER): Payer: Medicare Other | Admitting: Oncology

## 2010-12-25 ENCOUNTER — Other Ambulatory Visit: Payer: Self-pay | Admitting: Oncology

## 2010-12-25 DIAGNOSIS — D649 Anemia, unspecified: Secondary | ICD-10-CM

## 2010-12-25 DIAGNOSIS — I2699 Other pulmonary embolism without acute cor pulmonale: Secondary | ICD-10-CM

## 2010-12-25 DIAGNOSIS — I8390 Asymptomatic varicose veins of unspecified lower extremity: Secondary | ICD-10-CM

## 2010-12-25 DIAGNOSIS — I471 Supraventricular tachycardia: Secondary | ICD-10-CM

## 2010-12-25 LAB — CBC & DIFF AND RETIC
BASO%: 0.1 % (ref 0.0–2.0)
Basophils Absolute: 0 10*3/uL (ref 0.0–0.1)
HCT: 32.3 % — ABNORMAL LOW (ref 34.8–46.6)
HGB: 10.5 g/dL — ABNORMAL LOW (ref 11.6–15.9)
Immature Retic Fract: 10.9 % — ABNORMAL HIGH (ref 0.00–10.70)
MONO#: 0.5 10*3/uL (ref 0.1–0.9)
NEUT#: 4.5 10*3/uL (ref 1.5–6.5)
NEUT%: 62.3 % (ref 38.4–76.8)
WBC: 7.2 10*3/uL (ref 3.9–10.3)
lymph#: 1.9 10*3/uL (ref 0.9–3.3)

## 2010-12-25 LAB — PROTIME-INR

## 2010-12-25 NOTE — Telephone Encounter (Signed)
Pt is having these prescribed by pain management- pt is to contact them for refills.  She needs to call and discuss with him the dosage that she feels works best for her.  Will have nursing staff contact pt about this issue and why I am not refilling this.  i discussed this in detail with pt at her last appointment.

## 2010-12-26 ENCOUNTER — Telehealth: Payer: Self-pay | Admitting: *Deleted

## 2010-12-26 NOTE — Telephone Encounter (Signed)
Message copied by Tessie Fass on Tue Dec 26, 2010 12:23 PM ------      Message from: De Nurse      Created: Tue Dec 19, 2010  4:25 PM      Regarding: FW: records                   ----- Message -----         From: Ellin Mayhew         Sent: 12/15/2010   5:43 PM           To: [facility]      Subject: records                                                  I need to get records from      1) last visit/evaluation at [facility] imaging for varicose veins.      2) last appt with harriet long at [facility] center      3) visit records from [facility]--she is transferring her care to [place].            Pt was supposed to sign roi prior to leaving yesterday.  I do not know if she did this or not.

## 2010-12-26 NOTE — Telephone Encounter (Signed)
Called patient attempted to leave message was disconnected. If patient returns call she needs to come in and sign ROI.Busick, Rodena Medin

## 2010-12-26 NOTE — Telephone Encounter (Signed)
Patient has appointment with lab Thursday, will have sign at that time.[name] [name], Rodena Medin

## 2010-12-26 NOTE — Telephone Encounter (Signed)
Message copied by Tessie Fass on Tue Dec 26, 2010 12:20 PM ------      Message from: De Nurse      Created: Tue Dec 19, 2010  4:25 PM      Regarding: FW: records                   ----- Message -----         From: Ellin Mayhew         Sent: 12/15/2010   5:43 PM           To: [facility] Admin      Subject: records                                                  I need to get records from      1) last visit/evaluation at [facility] imaging for varicose veins.      2) last appt with harriet long at [facility] center      3) visit records from [facility]--she is transferring her care to [place].            Pt was supposed to sign roi prior to leaving yesterday.  I do not know if she did this or not.

## 2010-12-28 ENCOUNTER — Ambulatory Visit: Payer: Medicare Other

## 2010-12-28 NOTE — Assessment & Plan Note (Signed)
This is a 70 year old female with long history of back pain who was treated here at [facility] in 2003 and 204, history of lumbar fusion at L4-5 in 2003, underwent 2 epidural steroid injections at L5-S1 level with success.  He has had a pulmonary embolism, hypercoagulable state, psychiatric admission for warfarin overdose plus/minus acetaminophen overdose.  When I saw her last month on initial consultation on October 30, 2010, diagnosis of chronic back pain, lumbar postlaminectomy syndrome plus/minus radicular component. He was not able to get the okay to come off his Coumadin for an epidural injection from his hematologist.  He was made a nonnarcotic treatment program secondary to psychiatric and additional comorbidities.  She states that the tramadol is not providing long enough relief.  She has never tried tramadol ER.  Pain is 10.  This has an interval history of fall, but reportedly had x- rays of her left shoulder, seen by orthopedist.  Her last spine x-rays were on November 29, 2010, stable postoperative changes, medical screws at L4-5 level.  T-spine x-ray normal.  C-spine x-rays show some moderate bilateral foraminal stenosis at C6-7 level.  Marked bilateral neural foraminal stenosis at L7-T1, however, she does not have any upper extremity numbness.  PHYSICAL EXAMINATION:  Blood pressure 156/90, pulse 74, respirations 18, and sat 97% on room air.  This is an obese female in no acute distress. Orientation x3.  Affect alert.  Gait is with a cane.  She has full strength in the upper and lower extremities.  She has normal sensation in the upper lower extremities.  Deep tendon reflexes are reduced in bilateral ankles, otherwise intact.  She has mild tenderness to palpation at lumbosacral junction.  Cervical range of motion is normal.  IMPRESSION: 1. Chronic back pain, postlaminectomy syndrome.  No radicular     component. 2. Cervical stenosis.  Does not  appear to have any evidence of     myelopathy or radiculopathy. 3. Pain management.  She has had previous good results with tramadol     50 t.i.d. and Flexeril 5 mg b.i.d.  Certainly not a candidate for     nonsteroidals given warfarin.  Also not a candidate for injections     or axial spine injections given the warfarin.  We will trial her on tramadol ER 100 mg a day.  We need to keep overall dose low secondary to concomitant fluoxetine for her depression.  We will follow her up in about 6 weeks.  May need to add some Flexeril at that time or she calls in with poor results from the tramadol we would add the Flexeril at that time.     Erick Colace, M.D. Electronically Signed    AEK/MedQ D:  12/05/2010 10:26:55  T:  12/05/2010 11:50:18  Job #:  [id number]

## 2010-12-28 NOTE — Telephone Encounter (Signed)
Error

## 2010-12-30 LAB — POCT CARDIAC MARKERS
CKMB, poc: 2.4 ng/mL (ref 1.0–8.0)
Myoglobin, poc: 72.2 ng/mL (ref 12–200)
Troponin i, poc: <0.05 ng/mL (ref 0.00–0.09)

## 2010-12-30 LAB — POCT I-STAT, CHEM 8
BUN: 11 mg/dL (ref 6–23)
Chloride: 103 mEq/L (ref 96–112)
Creatinine, Ser: 0.7 mg/dL (ref 0.4–1.2)
Hemoglobin: 9.9 g/dL — ABNORMAL LOW (ref 12.0–15.0)
Potassium: 3.2 mEq/L — ABNORMAL LOW (ref 3.5–5.1)
Sodium: 141 mEq/L (ref 135–145)
Sodium: 144 mEq/L (ref 135–145)
TCO2: 27 mmol/L (ref 0–100)

## 2010-12-31 LAB — DIFFERENTIAL
Basophils Absolute: 0 10*3/uL (ref 0.0–0.1)
Basophils Relative: 1 % (ref 0–1)
Monocytes Relative: 8 % (ref 3–12)
Neutro Abs: 3.8 10*3/uL (ref 1.7–7.7)
Neutrophils Relative %: 62 % (ref 43–77)

## 2010-12-31 LAB — POCT I-STAT, CHEM 8
Chloride: 104 mEq/L (ref 96–112)
HCT: 29 % — ABNORMAL LOW (ref 36.0–46.0)
Hemoglobin: 9.9 g/dL — ABNORMAL LOW (ref 12.0–15.0)
Potassium: 3.5 mEq/L (ref 3.5–5.1)
Sodium: 141 mEq/L (ref 135–145)

## 2010-12-31 LAB — CBC
MCHC: 33.7 g/dL (ref 30.0–36.0)
Platelets: 241 10*3/uL (ref 150–400)
RBC: 2.98 MIL/uL — ABNORMAL LOW (ref 3.87–5.11)
RDW: 15.9 % — ABNORMAL HIGH (ref 11.5–15.5)

## 2010-12-31 LAB — HEMOCCULT GUIAC POC 1CARD (OFFICE): Fecal Occult Bld: NEGATIVE

## 2011-01-02 ENCOUNTER — Encounter: Payer: Self-pay | Admitting: Family Medicine

## 2011-01-02 ENCOUNTER — Emergency Department (HOSPITAL_COMMUNITY)
Admission: EM | Admit: 2011-01-02 | Discharge: 2011-01-02 | Disposition: A | Discharge status: Home / Self Care | Payer: Medicare Other | Attending: Emergency Medicine | Admitting: Emergency Medicine

## 2011-01-02 ENCOUNTER — Ambulatory Visit (INDEPENDENT_AMBULATORY_CARE_PROVIDER_SITE_OTHER): Payer: Medicare Other | Admitting: *Deleted

## 2011-01-02 ENCOUNTER — Ambulatory Visit (INDEPENDENT_AMBULATORY_CARE_PROVIDER_SITE_OTHER): Payer: Medicare Other | Admitting: Family Medicine

## 2011-01-02 DIAGNOSIS — M25659 Stiffness of unspecified hip, not elsewhere classified: Secondary | ICD-10-CM | POA: Insufficient documentation

## 2011-01-02 DIAGNOSIS — Z79899 Other long term (current) drug therapy: Secondary | ICD-10-CM | POA: Insufficient documentation

## 2011-01-02 DIAGNOSIS — Z86711 Personal history of pulmonary embolism: Secondary | ICD-10-CM | POA: Insufficient documentation

## 2011-01-02 DIAGNOSIS — G8929 Other chronic pain: Secondary | ICD-10-CM

## 2011-01-02 DIAGNOSIS — Z7901 Long term (current) use of anticoagulants: Secondary | ICD-10-CM

## 2011-01-02 DIAGNOSIS — M545 Low back pain, unspecified: Secondary | ICD-10-CM | POA: Insufficient documentation

## 2011-01-02 DIAGNOSIS — F341 Dysthymic disorder: Secondary | ICD-10-CM | POA: Insufficient documentation

## 2011-01-02 DIAGNOSIS — I10 Essential (primary) hypertension: Secondary | ICD-10-CM | POA: Insufficient documentation

## 2011-01-02 DIAGNOSIS — M25569 Pain in unspecified knee: Secondary | ICD-10-CM | POA: Insufficient documentation

## 2011-01-02 DIAGNOSIS — I2699 Other pulmonary embolism without acute cor pulmonale: Secondary | ICD-10-CM

## 2011-01-02 NOTE — Patient Instructions (Signed)
I am sorry you are unhappy with your pain management clinic. I think it is important for you to get your testing as recommended. Schedule follow-up with your PCP.

## 2011-01-02 NOTE — Progress Notes (Signed)
  Subjective:    Patient ID: [name] [name], female    DOB: 28-Nov-1940, 70 y.o.   MRN: [id number]  HPIpatient requested same day appointment for chronic back pain.  Chronic back pain:  States is has bee on going for several years.  Patient states pain is worse today.  Has chronic right leg numbness and tingling.  States both legs are weak.  Denies and saddle anesthesia, change in bowel or bladder.  No trauma or change in activity.    Chart review reveals that she is seeing a pain management clinic.  She had been getting epidural injections for back pain, but has since had to discontinue due to being on lifelong coumadin.  She states she saw her pain clinic in the last few weeks and they increased her tramadol from 50 tid to 100 tid.  She states it did not help and she went back down to 50 tid.  She denies taking other medications listed such as lyrica.    She notes getting percocet from the ER several times.  When I ask who her pain management doctor is, she shows me a paper from their office which states that she is there for non-narcotic pain management and requests that her PCP not place her on any narcotics. I discussed this with the patient who responds "then why did I come here?"  She has been prescribed a course of physical therapy- first session is in 3 days.  When I asked her what she felt would be most helpful in treating her pain, she states "percocet".     Review of Systems  Constitutional: Negative for fever.  see hpi     Objective:   Physical Exam  Constitutional: She appears well-developed. No distress.       Uses cane  Musculoskeletal:       States she is tender to palpation at every point along spine and paraspinal muscles.  Negative straight leg raise.  Neurological: She has normal strength and normal reflexes. She displays normal reflexes.  normal sensation to light touch in lower extremities        Assessment & Plan:

## 2011-01-03 LAB — POCT I-STAT, CHEM 8
HCT: 33 % — ABNORMAL LOW (ref 36.0–46.0)
Hemoglobin: 11.2 g/dL — ABNORMAL LOW (ref 12.0–15.0)
Potassium: 3.9 mEq/L (ref 3.5–5.1)
Sodium: 138 mEq/L (ref 135–145)
TCO2: 29 mmol/L (ref 0–100)

## 2011-01-03 NOTE — Assessment & Plan Note (Signed)
Under the care of pain management specialist.  No red flags on exam today.  Discussed with patient that I cannot prescribe narcotics.  Advised her to keep appointment with physical therapy and to discuss chronic pain management plan further with her pain specialist and PCP.  Patient states she is unhappy and does not intend to follow-up with any doctor except for her hematologist.

## 2011-01-04 ENCOUNTER — Encounter: Payer: Self-pay | Admitting: Gastroenterology

## 2011-01-04 LAB — CBC
Hemoglobin: 11.2 g/dL — ABNORMAL LOW (ref 12.0–15.0)
RBC: 3.31 MIL/uL — ABNORMAL LOW (ref 3.87–5.11)
RDW: 16.7 % — ABNORMAL HIGH (ref 11.5–15.5)

## 2011-01-04 LAB — DIFFERENTIAL
Basophils Absolute: 0 10*3/uL (ref 0.0–0.1)
Basophils Relative: 0 % (ref 0–1)
Neutro Abs: 4.3 10*3/uL (ref 1.7–7.7)
Neutrophils Relative %: 87 % — ABNORMAL HIGH (ref 43–77)

## 2011-01-04 LAB — COMPREHENSIVE METABOLIC PANEL
Alkaline Phosphatase: 78 U/L (ref 39–117)
BUN: 7 mg/dL (ref 6–23)
Chloride: 103 mEq/L (ref 96–112)
Creatinine, Ser: 0.82 mg/dL (ref 0.4–1.2)
Glucose, Bld: 112 mg/dL — ABNORMAL HIGH (ref 70–99)
Potassium: 3.5 mEq/L (ref 3.5–5.1)
Total Bilirubin: 0.6 mg/dL (ref 0.3–1.2)

## 2011-01-04 LAB — URINALYSIS, ROUTINE W REFLEX MICROSCOPIC
Nitrite: NEGATIVE
Specific Gravity, Urine: 1.028 (ref 1.005–1.030)
Urobilinogen, UA: 1 mg/dL (ref 0.0–1.0)

## 2011-01-04 LAB — LIPASE, BLOOD: Lipase: 16 U/L (ref 11–59)

## 2011-01-04 NOTE — Progress Notes (Signed)
Per records review from Dr Cyndie Chime, [facility] 12/25/2010, patient may only hold coumadin for 3 days for upcoming colonoscopy.  Upon further review of the patient's chart she has canceled her upcoming colonoscopy.  Will await the patient to call back to reschedule

## 2011-01-09 ENCOUNTER — Other Ambulatory Visit: Payer: Self-pay | Admitting: Oncology

## 2011-01-09 ENCOUNTER — Encounter (HOSPITAL_BASED_OUTPATIENT_CLINIC_OR_DEPARTMENT_OTHER): Payer: Medicare Other | Admitting: Oncology

## 2011-01-09 DIAGNOSIS — I471 Supraventricular tachycardia, unspecified: Secondary | ICD-10-CM

## 2011-01-09 DIAGNOSIS — I2699 Other pulmonary embolism without acute cor pulmonale: Secondary | ICD-10-CM

## 2011-01-09 DIAGNOSIS — D649 Anemia, unspecified: Secondary | ICD-10-CM

## 2011-01-09 DIAGNOSIS — I8390 Asymptomatic varicose veins of unspecified lower extremity: Secondary | ICD-10-CM

## 2011-01-09 LAB — DIFFERENTIAL
Basophils Relative: 1 % (ref 0–1)
Monocytes Relative: 6 % (ref 3–12)
Neutro Abs: 6.4 10*3/uL (ref 1.7–7.7)
Neutrophils Relative %: 67 % (ref 43–77)

## 2011-01-09 LAB — BASIC METABOLIC PANEL
CO2: 31 mEq/L (ref 19–32)
Calcium: 9 mg/dL (ref 8.4–10.5)
Creatinine, Ser: 0.91 mg/dL (ref 0.4–1.2)
GFR calc Af Amer: >60 mL/min (ref >60)
GFR calc non Af Amer: >60 mL/min (ref >60)

## 2011-01-09 LAB — POCT CARDIAC MARKERS: Myoglobin, poc: 71.5 ng/mL (ref 12–200)

## 2011-01-09 LAB — CBC
MCHC: 34.3 g/dL (ref 30.0–36.0)
RBC: 3.22 MIL/uL — ABNORMAL LOW (ref 3.87–5.11)
WBC: 9.5 10*3/uL (ref 4.0–10.5)

## 2011-01-09 LAB — PROTIME-INR
INR: 1 (ref 0.00–1.49)
INR: 1 — ABNORMAL LOW (ref 2.00–3.50)
Prothrombin Time: 13.5 seconds (ref 11.6–15.2)

## 2011-01-10 ENCOUNTER — Encounter: Payer: Medicare Other | Admitting: Gastroenterology

## 2011-01-11 ENCOUNTER — Ambulatory Visit: Payer: Medicare Other | Attending: Physical Medicine & Rehabilitation | Admitting: Physical Therapy

## 2011-01-11 ENCOUNTER — Telehealth: Payer: Self-pay | Admitting: Family Medicine

## 2011-01-11 DIAGNOSIS — R42 Dizziness and giddiness: Secondary | ICD-10-CM | POA: Insufficient documentation

## 2011-01-11 DIAGNOSIS — IMO0001 Reserved for inherently not codable concepts without codable children: Secondary | ICD-10-CM | POA: Insufficient documentation

## 2011-01-11 DIAGNOSIS — R269 Unspecified abnormalities of gait and mobility: Secondary | ICD-10-CM | POA: Insufficient documentation

## 2011-01-11 NOTE — Telephone Encounter (Signed)
Needs to know if  coumodin was monitored here, if so can they get a copy of INRs. fax to  [phone]

## 2011-01-11 NOTE — Telephone Encounter (Signed)
Records faxed..[facility]  

## 2011-01-15 ENCOUNTER — Ambulatory Visit: Payer: Medicare Other | Admitting: Family Medicine

## 2011-01-16 ENCOUNTER — Encounter: Payer: Medicare Other | Admitting: Physical Therapy

## 2011-01-18 ENCOUNTER — Encounter: Payer: Self-pay | Admitting: Family Medicine

## 2011-01-18 DIAGNOSIS — D649 Anemia, unspecified: Secondary | ICD-10-CM | POA: Insufficient documentation

## 2011-01-18 DIAGNOSIS — D6851 Activated protein C resistance: Secondary | ICD-10-CM | POA: Insufficient documentation

## 2011-01-22 ENCOUNTER — Encounter: Payer: Medicare Other | Admitting: Physical Therapy

## 2011-01-25 ENCOUNTER — Encounter: Payer: Medicare Other | Admitting: Physical Therapy

## 2011-01-29 ENCOUNTER — Encounter: Payer: Medicare Other | Admitting: Physical Therapy

## 2011-02-01 ENCOUNTER — Encounter: Payer: Medicare Other | Admitting: Physical Therapy

## 2011-02-02 ENCOUNTER — Telehealth: Payer: Self-pay | Admitting: Gastroenterology

## 2011-02-02 ENCOUNTER — Encounter (HOSPITAL_BASED_OUTPATIENT_CLINIC_OR_DEPARTMENT_OTHER): Payer: Medicare Other | Admitting: Oncology

## 2011-02-02 ENCOUNTER — Other Ambulatory Visit: Payer: Self-pay | Admitting: Oncology

## 2011-02-02 DIAGNOSIS — I8390 Asymptomatic varicose veins of unspecified lower extremity: Secondary | ICD-10-CM

## 2011-02-02 DIAGNOSIS — I2699 Other pulmonary embolism without acute cor pulmonale: Secondary | ICD-10-CM

## 2011-02-02 DIAGNOSIS — D649 Anemia, unspecified: Secondary | ICD-10-CM

## 2011-02-02 DIAGNOSIS — I471 Supraventricular tachycardia: Secondary | ICD-10-CM

## 2011-02-02 LAB — CBC WITH DIFFERENTIAL/PLATELET
BASO%: 0.3 % (ref 0.0–2.0)
EOS%: 3.9 % (ref 0.0–7.0)
MCH: 30.1 pg (ref 25.1–34.0)
MCHC: 33.6 g/dL (ref 31.5–36.0)
RBC: 3.72 10*6/uL (ref 3.70–5.45)
RDW: 16.6 % — ABNORMAL HIGH (ref 11.2–14.5)
lymph#: 2.2 10*3/uL (ref 0.9–3.3)

## 2011-02-02 LAB — PROTIME-INR: INR: 1.2 — ABNORMAL LOW (ref 2.00–3.50)

## 2011-02-02 NOTE — Telephone Encounter (Signed)
Left message for patient to call back  

## 2011-02-05 ENCOUNTER — Encounter: Payer: Medicare Other | Admitting: Physical Therapy

## 2011-02-06 ENCOUNTER — Encounter: Payer: Medicare Other | Attending: Physical Medicine & Rehabilitation

## 2011-02-06 ENCOUNTER — Ambulatory Visit: Payer: Medicare Other | Admitting: Physical Medicine & Rehabilitation

## 2011-02-06 DIAGNOSIS — M4804 Spinal stenosis, thoracic region: Secondary | ICD-10-CM | POA: Insufficient documentation

## 2011-02-06 DIAGNOSIS — Z7901 Long term (current) use of anticoagulants: Secondary | ICD-10-CM | POA: Insufficient documentation

## 2011-02-06 DIAGNOSIS — M961 Postlaminectomy syndrome, not elsewhere classified: Secondary | ICD-10-CM | POA: Insufficient documentation

## 2011-02-06 DIAGNOSIS — M4802 Spinal stenosis, cervical region: Secondary | ICD-10-CM | POA: Insufficient documentation

## 2011-02-06 DIAGNOSIS — M549 Dorsalgia, unspecified: Secondary | ICD-10-CM | POA: Insufficient documentation

## 2011-02-06 DIAGNOSIS — Z86718 Personal history of other venous thrombosis and embolism: Secondary | ICD-10-CM | POA: Insufficient documentation

## 2011-02-06 NOTE — Telephone Encounter (Signed)
I contacted the patient again and she is not wanting to schedule "I can't mentally handle it , that is why I didn't call you back".  She declines to reschedule the recommended colon.

## 2011-02-06 NOTE — Telephone Encounter (Signed)
Recommendations made for colon and egd with propofol and off Coumadin. She seems to understand this advice but is not ready to schedule. It is up to her to contact us if and when she decides to proceed.

## 2011-02-08 ENCOUNTER — Encounter: Payer: Medicare Other | Admitting: Physical Therapy

## 2011-02-08 ENCOUNTER — Encounter (HOSPITAL_BASED_OUTPATIENT_CLINIC_OR_DEPARTMENT_OTHER): Payer: Medicare Other | Admitting: Oncology

## 2011-02-08 ENCOUNTER — Other Ambulatory Visit: Payer: Self-pay | Admitting: Oncology

## 2011-02-08 DIAGNOSIS — I471 Supraventricular tachycardia, unspecified: Secondary | ICD-10-CM

## 2011-02-08 DIAGNOSIS — I2699 Other pulmonary embolism without acute cor pulmonale: Secondary | ICD-10-CM

## 2011-02-08 DIAGNOSIS — I8390 Asymptomatic varicose veins of unspecified lower extremity: Secondary | ICD-10-CM

## 2011-02-08 DIAGNOSIS — D649 Anemia, unspecified: Secondary | ICD-10-CM

## 2011-02-08 LAB — PROTIME-INR
INR: 1.4 — ABNORMAL LOW (ref 2.00–3.50)
Protime: 16.8 Seconds — ABNORMAL HIGH (ref 10.6–13.4)

## 2011-02-09 NOTE — Consult Note (Signed)
[name] [name], [name] [name]                            ACCOUNT NO.:  [id number]   MEDICAL RECORD NO.:  [id number]                   PATIENT TYPE:   LOCATION:                                       FACILITY:  [facility]   PHYSICIAN:  Zachary George, DO                      DATE OF BIRTH:  08/19/1941   DATE OF CONSULTATION:  09/11/2002  DATE OF DISCHARGE:                                   CONSULTATION   REASON FOR CONSULTATION:  The patient returns to clinic today for  reevaluation.  She was last seen on August 14, 2002, at which time she  underwent a second lumbar epidural steroid injection for post-laminectomy  syndrome with chronic low back pain with lower extremity radicular symptoms.  The patient got significant relief after the second lumbar epidural steroid  injection for only approximately one to two weeks; after that, her pain has  progressively increased towards baseline.  She sits in the examination room  rather comfortably but tells me that her pain is severe, worst imaginable,  and rates it a 10/10 on a subjective scale.  She seems to have accepted the  fact that she is likely to have fluctuating pain probably for the rest of  her life.  I agreed with her and discussed this situation and informed her  that we are trying to get her to the point where she has more good days than  bad days in terms of her pain complaints.  She states that she has pain from  the top of her head all the way through her body to her knees, sparing her  legs and arms.  She states that it feels like her ribs are cracking.  I  reviewed health and history form and 14-point review of systems.  Her  function and quality-of-life indices seem to have improved significantly.  In terms of medications, the patient has been taking Norco 5 mg three times  a day, which she states has improved her pain and function.  We discussed  this medication in detail.   PHYSICAL EXAMINATION:  GENERAL:  Physical exam reveals a pleasant  female in  no acute distress.  She is sitting rather comfortably in the chair  discussing her situation with me.  VITAL SIGNS:  Her blood pressure is 131/67, pulse 89, respirations 16, O2  saturation 96% on room air.  NEUROMUSCULAR:  Examination of the back reveals tenderness to palpation in  bilateral lumbar paraspinal muscles.  There is also diffuse tenderness up  into the thoracic and cervical paraspinous as well.  Manual muscle testing  is 5/5, bilateral lower extremities.  Sensory exam intact to light touch,  bilateral lower extremities.  Muscle stretch reflexes are 0/4, bilateral  lower extremities.   IMPRESSION:  1. Post-laminectomy syndrome.  2. Chronic low back pain.  3. History of spinal stenosis of the  lumbar spine, status post L4-5     decompressive laminectomy and fusion.   PLAN:  1. Continue home exercise program.  2. Continue Norco but decrease to 5/325 mg one p.o. b.i.d. as needed, #60     without refills.  The patient understands the potential habituating     nature of this medication.  3. Continue Flexeril 5 mg one p.o. b.i.d. as needed, #60 without refills.  4. Patient to follow up in one month or sooner as needed.  5. Consider repeat lumbar epidural steroid injection if symptoms worsen.   Patient was educated in the above findings and recommendations and  understands.  There were no barriers to communication.                                               Zachary George, DO    JW/MEDQ  D:  09/11/2002  T:  09/12/2002  Job:  [id number]   cc:   Donalee Citrin, M.D.  [address]  [place]  [place] [address]  Fax: [phone]

## 2011-02-09 NOTE — Consult Note (Signed)
NAMEVIRGA, HALTIWANGER                            ACCOUNT NO.:  [id number]   MEDICAL RECORD NO.:  [id number]                   PATIENT TYPE:   LOCATION:                                       FACILITY:   PHYSICIAN:  Sondra Come, D.O.                 DATE OF BIRTH:   DATE OF CONSULTATION:  06/25/2002  DATE OF DISCHARGE:                                   CONSULTATION   HISTORY OF PRESENT ILLNESS:  Ms. Durango returns to the clinic today for re-  evaluation. She was a no show for her appointment on June 18, 2002.  Last appointment date was May 13, 2002. Since that time, she states that  she has been to the emergency room for treatment of her low back pain. I  have been prescribing Ultracet in addition to her Mobic to help with her low  back pain, which does not seem to be getting much better. She is unable to  take Ultracet up to eight per day because she states that it messes up my  stomach. The physician in the emergency room gave her six Percocet pills,  which she states helped significantly. She has also been provided with  Vicodin in the past, which has helped significantly. We discussed further  medication management. Also in the interim, I have received copies of her  neurosurgical reports. She is status post L4-5 laminectomy and fusion for  spinal stenosis and spondylolisthesis. In addition, she states that she has  had an epidural injection sometime ago, which gave her significant relief  for approximately one month. We also discussed further interventional  approaches to help in her pain. I reviewed the health and history form on a  fourteen point review of systems. The patient's pain today is a 10/10 on a  subjective scale. Her function and quality of life indices remain somewhat  declined. She does complain of pain radiating from her low back into  bilateral lower extremities, which seems to be worse with sitting and  bending. She started PT last week and had one  appointment so far. She will  go back today. They have given her some exercises already, which seem to be  helping according to her report.   PHYSICAL EXAMINATION:  GENERAL: Healthy female in no acute distress.  VITAL SIGNS: Blood pressure 139/88, pulse 72, respiratory rate 18, O2 sat  94% on room air.  BACK: Examination reveals tenderness to palpation bilateral lumbar  paraspinal muscles. Today, there is more pain with flexion than extension,  which she states actually relieves her pain at this time. Manual muscle  testing is 5/5 bilateral lower extremities. Sensory is intact to light touch  bilateral lower extremities. Muscle stretch reflexes are 0/4 bilateral lower  extremities.   IMPRESSION:  1. Post laminectomy syndrome.  2. Chronic low back pain.  3. History of spinal stenosis of the lumbar spine,  status post L4-5     decompressive laminectomy and fusion.   PLAN:  1. I had a long discussion with Ms. Samad regarding further treatment     options including medications as well as interventional approach. It is     reasonable to proceed with a lumbar epidural steroid injection, as the     patient has had significant relief with this in the past. This was     discussed at length and the patient wishes to proceed in this regard.  2. Discontinue Ultracet and begin Norco 5/325 mg one po three times a day as     needed for pain #30 with no refills.  3. Continue Mobic.  4. Continue with PT. The interventional procedure should facilitate a     therapy program. I think this is going to help the patient in the long     run.  5. Follow-up with Dr. Wynetta Emery as scheduled.  6. The patient will return to the clinic for epidural injection.   The patient was educated in the above findings and recommendations and she  understands. There were no barriers to communication.                                               Sondra Come, D.O.    JJW/MEDQ  D:  06/25/2002  T:  06/25/2002  Job:   [id number]   cc:   Jillyn Hidden P. Wynetta Emery, M.D.

## 2011-02-09 NOTE — Discharge Summary (Signed)
[place]  Patient:    ARPI, DIEBOLD Visit Number: [id number] MRN: [id number]          Service Type: EMS Location: [facility] Attending Physician:  Carmelina Peal Dictated by:   Garlon Hatchet., M.D. Admit Date:  10/21/2001 Discharge Date: 10/21/2001                             Discharge Summary  ADMITTING DIAGNOSIS:  Discogenic degenerative spondylolithesis, L4-5.  PROCEDURE:  Decompressive laminectomy and PLIF.  HOSPITAL COURSE:  Patient was admitted as an EMA and underwent the aforementioned procedure.   Postoperatively, the patient did very well, went to the recovery room and then to the floor.  On the floor, patient received physical therapy.  Her back and leg pain was significantly improved.  She was able to mobilize in a brace and with a walker.  The patient remained afebrile, mobilizing with physical therapy over the next couple of days.  She was continuing to progressively move.  The pain was well controlled on pills.  The Hemovac was able to be taken out day #2.  The patient was able to be discharged on day #3.  DISCHARGE FOLLOW-UP:  Two weeks in clinic and scheduled follow up with home health and home physical therapy. Dictated by:   Garlon Hatchet., M.D. Attending Physician:  Carmelina Peal DD:  10/30/01 TD:  10/30/01 Job: [id number]

## 2011-02-09 NOTE — Consult Note (Signed)
[name] [name], [name] [name]                            ACCOUNT NO.:  [id number]   MEDICAL RECORD NO.:  [id number]                   PATIENT TYPE:   LOCATION:                                       FACILITY:  [facility]   PHYSICIAN:  Zachary George, DO                      DATE OF BIRTH:  1940/10/02   DATE OF CONSULTATION:  10/12/2002  DATE OF DISCHARGE:                                   CONSULTATION   REASON FOR CONSULTATION:  The patient returns to clinic today for  reevaluation.  She was last seen on September 11, 2002 and continues to  complain of diffuse pain involving her lower back, shoulders and knees  today.  She describes her pain as severe, although she checks 10/10 on a  subjective scale.  Function and quality-of-life indices have declined.  Sleeps remain poor.  At last visit, I prescribed Norco 5/325 mg and had  called in a prescription on September 30, 2002 as the patient was taking the  hydrocodone three times per day.  This latest prescription was for 50 pills  12 days ago and she is completely out of her medications.  She tells me that  she was taking the medications three times per day but by my calculation,  she was taking it 4 to 5 times per day.  She reveals to me today that she  has a history of drug addiction and was treated some years ago for this.  We  discussed this at length.  In addition, she takes Mobic 7.5 mg prescribed  daily, although she increased the dose on her own to three times per day.  I  reviewed the health and history form and 14-point review of systems.   PHYSICAL EXAMINATION:  GENERAL:  Physical exam reveals a healthy-appearing  female in no acute distress.  VITAL SIGNS:  Blood pressure 152/79, pulse 78, respirations 20, O2  saturation 96% on room air.  NEUROMUSCULAR:  Neurologic examination is intact in the upper and lower  extremities including motor, sensory and reflexes.  Range of motion of the  upper extremities is full bilaterally with what appears to be  minimal  discomfort.  There is tenderness to palpation in bilateral lumbar paraspinal  muscles.   IMPRESSION:  1. Chronic low back pain.  2. Post-laminectomy syndrome.  3. Bilateral knee pain.  4. Bilateral shoulder pain.  5. History of drug addiction.   PLAN:  1. I discussed treatment options with the patient.  She is very early on her     medications and has not been compliant with the plan.  Given the fact     that she has a history of drug addiction and that she is extremely early     on her medications, I do not feel that it is appropriate to continue with     narcotic-based pain medications  at this time.  2. I would like to get the patient involved with an addictionologist and we     discussed this and she agrees.  3. I will prescribe Lidoderm 5% -- apply up to 12 hours per day, maximum of     3 patches at a time -- #30 with 2 refills.  4. Change Mobic to 7.5 mg one p.o. b.i.d. for a maximum dose of 15 mg per     day and she is counseled.  5. Patient to return to clinic in two to three months for reevaluation.   Patient was educated in the above findings and recommendations and  understands.  There were no barriers to communication.                                                Zachary George, DO    JW/MEDQ  D:  10/12/2002  T:  10/13/2002  Job:  [id number]   cc:   Donalee Citrin, M.D.  [address]  [place]  [place] [address]  Fax: [phone]

## 2011-02-09 NOTE — Consult Note (Signed)
[name], [name]                            ACCOUNT NO.:  [id number]   MEDICAL RECORD NO.:  [id number]                   PATIENT TYPE:  REC   LOCATION:  [facility]PC                                  FACILITY:  [facility]   PHYSICIAN:  [name] [name], D.O.                 DATE OF BIRTH:  Nov 14, 1940   DATE OF CONSULTATION:  07/02/2002  DATE OF DISCHARGE:                                   CONSULTATION   HISTORY OF PRESENT ILLNESS:  [name] [name] returns to clinic today as  scheduled for a lumbar epidural steroid injection for chronic low back pain  with postlaminectomy syndrome, status post L4-5 decompressive laminectomy  and fusion.  The patient has undergone an epidural steroid injection some  time ago by a different [name] [name] and had significant improvement in her  symptoms.  She continues to complain of 7/10 pain on a subjective scale.  She has started physical therapy, which she states is helping to some  degree.  She also continues to take hydrocodone 5 mg/325 mg two to three  times per day which helps but she wants to get off the medication.  I  reviewed the health and history form, and 14-point review of systems.  No  new neurologic complaints.   PHYSICAL EXAMINATION:  VITAL SIGNS:  Blood pressure 123/77, pulse 94,  respirations 20, O2 saturation is 99% on room air.   IMPRESSION:  1. Chronic low back pain with bilateral lower extremity radicular symptoms  2. Postlaminectomy syndrome.  3. History of spinal stenosis of the lumbar spine status post L4-5     decompressive laminectomy and fusion.   PLAN:  1. Lumbar epidural steroid injection.  2. Decrease hydrocodone usage.  3. Continue Mobic.  4. Continue with physical therapy.  5. The patient is to return to clinic in three weeks for reevaluation and     possible repeat injection as predicated upon the patient's response and     symptoms.   PROCEDURE:  Lumbar epidural steroid injection.  The procedure was described  to the patient in  detail including risks, benefits, limitations, and  alternatives.  The patient wishes to proceed.  Informed consent was  obtained.   DESCRIPTION OF PROCEDURE:  The patient was brought back to the fluoroscopy  suite and placed on the table in prone position.  The skin was prepped and  draped in the usual sterile fashion.  Skin and subcutaneous tissues were  anesthetized with 3 cc of preservative-free 1% lidocaine.  Under direct  fluoroscopic guidance, an 18-gauge 3-1/2-inch Tuohy needle was advanced into  the right paramedian L5-S1 epidural space with loss of resistance technique.  No CSF, heme or paresthesias were noted.  This was then followed by the  injection of 1.5 cc of Kenalog 40 mg/cc plus 2.5 cc of normal saline with  needle flush.  No complications.  The patient tolerated the procedure  well.  Post-injection pain score is 0/10.  The patient was monitored and released  in stable condition.   The patient was educated on the above findings and recommendations, and  understands.  There were no barriers to communication.                                               [name] [name], D.O.    JJW/MEDQ  D:  07/02/2002  T:  07/04/2002  Job:  [id number]   cc:   Donalee Citrin, MD  [address]  [place]  [place] [address]  Fax: [phone]
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
## 2011-02-09 NOTE — Consult Note (Signed)
[name] [name], KNAPE                            ACCOUNT NO.:  [id number]   MEDICAL RECORD NO.:  [id number]                   PATIENT TYPE:   LOCATION:                                       FACILITY:   PHYSICIAN:  Sondra Come, D.O.                 DATE OF BIRTH:  07/04/1941   DATE OF CONSULTATION:  08/10/2002  DATE OF DISCHARGE:                                   CONSULTATION   [name] [name] returns to clinic today for reevaluation.  She was last seen on  07/22/02.  Since that time she states that her low back pain seems to be  getting a little bit worse but overall remains improved compared to the pain  she experienced prior to lumbar epidural steroids.  She comes to clinic  today requesting a repeat lumbar epidural steroid injection which previously  gave her nearly complete relief of her low back and lower extremity pain.  She continues to have pain radiating into her buttocks bilaterally at this  time.  Her function and quality of life indices remain stable although she  feels like there is still some room for improvement.  I reviewed the health  and history form and 14-point review of systems.  The patient continues  taking Mobic 7.5 mg daily.  Her pain level is a 7/10 on subjective scale.   PHYSICAL EXAMINATION:  Reveals a healthy female in no acute distress.  Blood  pressure 150/72, pulse 73, respirations 18, oxygen saturation is 99% on room  air.  Examination of the back reveals level pelvis with increased lordosis.  There is tenderness to palpation of bilateral lumbar paraspinal muscles.  Manual muscle testing is 5/5 in bilateral lower extremities.  Sensory  examination is intact to light touch in bilateral lower extremities.  Muscle  strength reflexes are 0/4 in bilateral patella, medial hamstrings, and  Achilles.  Straight-leg raises negative bilaterally.   IMPRESSION:  1. Chronic low back pain with radiation to buttocks bilaterally.  Overall     improved but symptoms  seem to be returning gradually.  2. Post laminectomy syndrome.  3. History of spinal stenosis of the lumbar spine status post L4-L5     decompression and laminectomy and fusion.   PLAN:  1. Discuss further treatment options with Ms. Callanan.  It is reasonable to     proceed with a repeat lumbar epidural steroid injection for pain control.     She wishes to proceed in this manner.  We will schedule her for next     available appointment.  2. Continue Mobic.  3. Patient will return to clinic for repeat epidural steroid injection.   Patient was educated about findings and recommendations and understands.  There were no barriers to communication.  Sondra Come, D.O.    JJW/MEDQ  D:  08/10/2002  T:  08/10/2002  Job:  [id number]

## 2011-02-09 NOTE — Op Note (Signed)
. [facility]  Patient:    [name] [name], [name] [name] Visit Number: [id number] MRN: [id number]          Service Type: SUR Location: [id number] Attending Physician:  Mariam Dollar Dictated by:   Garlon Hatchet., M.D. Proc. Date: 10/09/01 Admit Date:  10/09/2001                             Operative Report  PREOPERATIVE DIAGNOSIS:  Lumbar spinal stenosis and degenerative spondylolisthesis, L4-5, with bilateral L5 radiculopathy.  PROCEDURES: 1. Decompressive laminectomy L4-5. 2. Posterior lumbar interbody fusion, L4-5, using 10 x 24 and 10 x 26 mm    Tangent allografts. 3. Pedicle screw fixation at L4-5 using 6.5 x 45 mm pedicle screws at L4 and    L5, the M10 CD Horizon system. 4. Posterolateral arthrodesis L4-5. 5. Placement of a medium Hemovac drain.  SURGEON:  Garlon Hatchet., M.D.  FIRST ASSISTANT:  Reinaldo Meeker, M.D.  ANESTHESIA:  General endotracheal.  CLINICAL NOTE:  The patient is a very pleasant 69 year old female who has had long-standing back and bilateral leg pain radiating down to the top of the foot with numbness and tingling in the same distribution.  The patient has been refractory to anti-inflammatories, physical therapy, and had debilitating mechanical back pain.  Preoperative imaging showed degenerative spondylolisthesis at L4-5 and spinal stenosis with bilateral compression of both L5 nerve roots.  The patient was extensively counseled and recommended of the risks and benefits of a decompression and fusion at L4-5.  The patient desired to proceed forward.  DESCRIPTION OF PROCEDURE:  The patient was brought to the operating room and induced under general anesthesia.  The patient was positioned prone on the Wilson frame.  Her back was prepped and draped in the usual sterile fashion. After infiltration with 10 cc of lidocaine with epinephrine and preoperative localization of the L4-5 disk space, a midline incision was made with a  10 blade scalpel.  Bovie electrocautery was used to take down to the subcutaneous tissue.  Subperiosteal dissection was carried out in the laminae of L3, L4, and L5 bilaterally and out along the TPs, exposing the L4 and L5 TP and _____ pedicle.  The spinous process and laminar complex of L4 was noted to be hypermobile.  This was removed with a Leksell rongeur and using 3 and 4 mm Kerrison punch, the remainder of the lamina of L4 and medial facet complex was removed.  Ligament was noted to be severely hypertrophied, and both L4 and L5 nerve roots were noted to be under a large amount of compression.  These were underbitten with a 3 and 4 mm Kerrison punch and radically decompressed out the foramina of both L4 and L5 bilaterally.  At the end of the decompression the epidural veins were coagulated over the interspace and the fluoroscopy was brought into the field and the attention was taken to the pedicle screw placement.  Pilot holes were drilled in the lateral aspect of the facet complexes at L4, and then the awl was used to cannulate the pedicle.  The pedicle at L4 was noted to be sclerotic and very difficult to cannulate; however, this was achieved using a combination of drill and pedicle awl, probed with a ball-tip probe, and it was noted to be competent in 360 degree orientation.  Fluoroscopy confirmed good position and depth, and the pedicles were tapped with a 5.5 tap and 6.5  x 45 mm pedicle screws were placed in L4, and the same procedure was repeated at L5 on both sides.  After pedicle screw placement, attention was taken to placement of the allografts.  The DErrico nerve root retractor was used to reflect the L5 nerve root medially on the right.  Epidural veins were coagulated.  Annulotomy was made with an 11 blade scalpel and the pituitary rongeurs were used to radically clean out the disk space.  Then using a 10 and 11 distractor, using an 11 distractor this was inserted and then  fluoroscopy confirmed good size of this distractor, and it was decided at that time to place a 10 x 26 mm allograft.  Attention was taken to the patients left side.  Epidural veins were coagulated, annulotomy was made, and the disk space was radically cleaned out.  Using a medium-size cutter and chisel, the end plates were prepared to receive the allograft, and fluoroscopy confirmed depth of each instrument along the way.  Then the 10 x 26 mm Tangent allograft was inserted on the patients left side and attention was taken to the right side.  Again this disk space was cleaned out, cut with the cutter and chisel used to prepare the end plates, and then the Epstein was used to scrape off the central end plate and autograft was packed against the allograft on the patients left side.  This was locally-harvested autograft. Then the right-sided 26 mm Tangent allograft was inserted.  Fluoroscopy confirmed depth and approximately 1-2 mm deep to the posterior vertebral body line.  After both allografts had been inserted and the fluoroscopy confirmed good position, the wound was copiously irrigated.  The TPs and lateral facet complexes were decorticated.  The remainder of the autograft was packed along the lateral gutters and the the rods were sized and selected and using small 13 mm quarter-inch rods, they were inserted and the self-tightening nuts were tightened down and torqued down at L5, and the L4 pedicle screw was compressed against L5 and tightened down and torqued down.  Then meticulous hemostasis was maintained.  Fluoroscopy confirmed good position of the rods, screws, and bone graft.  Gelfoam was overlaid on top of the dura.  A medium Hemovac drain was placed, and the wound was closed using 0 interrupted Vicryl on the muscle and fascia, 2-0 interrupted Vicryl in the subcutaneous tissue, and a running 4-0 subcuticular.  Benzoin and Steri-Strips were applied.  The patient went to the recovery  room in stable condition.  At the end of the case, all needle counts and sponge counts were correct. Dictated by:   Garlon Hatchet., M.D. Attending Physician:  Mariam Dollar  DD:  10/09/01 TD:  10/10/01 Job: [id number]

## 2011-02-09 NOTE — Consult Note (Signed)
[name] [name], [name] [name]                            ACCOUNT NO.:  [id number]   MEDICAL RECORD NO.:  [id number]                   PATIENT TYPE:  EMS   LOCATION:  [facility]                                 FACILITY:  [facility]   PHYSICIAN:  Sondra Come, D.O.                 DATE OF BIRTH:  11-05-1940   DATE OF CONSULTATION:  05/13/2002  DATE OF DISCHARGE:                  PHYSICAL MEDICINE & REHABILITATION CONSULTATION   REASON FOR CONSULTATION:  The patient returns to clinic today for  reevaluation.  She was last seen on April 15, 2002.  Her main complaint today  is low back pain with cramps radiating to her posterior thighs bilaterally.  She rates it a 10/10 on a subjective scale.  She states she has a followup  appointment with Dr. Wynetta Emery on September 8th to follow up for lumbar fusion.  The patient is unsure of what levels have been fused.   MEDICATIONS:  She did not tolerate the Flexeril that I had prescribed at  last visit.  She continues to take Ultracet two pills three times per day  which she states helps for a few hours.  She has not taken it four times  daily and we discussed this.  She has used ice and heat which helped. She  states she went to the emergency room last week and was given four Vicodin  pills.  She states that it did help her to some degree.   REVIEW OF SYSTEMS:  I reviewed the health and history form and 14-point  review of systems.  The patient denies any gallbladder dysfunction.   PHYSICAL EXAMINATION:  GENERAL:  A healthy-appearing female in no acute  distress.  VITAL SIGNS:  Blood pressure is 152/79, pulse 90, respirations 18.  O2  saturations 97% on room air.  BACK:  Increased lumbar lordosis with more pain on extension than flexion  but range of motion is somewhat guarded in all planes.  EXTREMITIES:  Manual muscle testing is 5/5 bilateral lower extremities.  Sensory intact to light touch bilateral lower extremities.  Muscle strength  reflexes are 0/4 bilateral  lower extremities.  Straight leg raise is  negative bilaterally.   IMPRESSION:  Chronic low back pain with history of spinal stenosis of the  lumbar spine status post laminectomy and fusion per the patient's report.  Level of fusion undetermined.   PLAN:  1. Will try to further records in regards to the patient's low back surgery.     She may be a candidate for lumbar epidural steroid injections or possibly     even facet joint injections.  2. Continue Ultracet 1-2 four times daily as needed dispense #100 with two     refills.  3. Will begin physical therapy for electric stimulation to help with spasm     control, range of motion, stretching and a progressive lumbar     stabilization program.  I educated the patient on the importance of  a     consistent home exercise program for best outcome measure.  4. Followup with Dr. Wynetta Emery as scheduled.  5. The patient to return to clinic in one month for reevaluation.   The patient was educated on above findings and recommendations and  understands.  There were no barriers to communication.                                                 Sondra Come, D.O.    JJW/MEDQ  D:  05/13/2002  T:  05/13/2002  Job:  [id number]

## 2011-02-09 NOTE — Consult Note (Signed)
[name] [name], [name] [name]                            ACCOUNT NO.:  [id number]   MEDICAL RECORD NO.:  [id number]                   PATIENT TYPE:  REC   LOCATION:  [facility]                                  FACILITY:  [facility]   PHYSICIAN:  Zachary George, DO                      DATE OF BIRTH:  [birth year]   DATE OF CONSULTATION:  11/03/2002  DATE OF DISCHARGE:                                   CONSULTATION   HISTORY OF PRESENT ILLNESS:  The patient returns to clinic today sooner than  scheduled.  She was last seen on October 12, 2002.  She returns to clinic  today to discuss medications and her pain.  She continues to have pain  involving her entire body.  She rates her pain as a 10/10 on subjective  scale.  She does get some relief with Lidoderm patches but she states that  she does not think that the patches are a solution.  She continues on Mobic  7.5 mg daily.  She has taken Zanaflex 4 mg one half to one which she states  makes her too sluggish.  In the interim, she has not had an appointment with  the addictionologist, stating that she already has a psychiatrist.  I am  uncertain whether her current psychiatrist is actually and addictionologist.  She did relate to me at last visit that she has a history of drug addiction  and she was very early on her pain medications and I discussed this with her  at the time and did not feel like she was a candidate for opiate analgesia  without first having been evaluated by an addictionologist.  Throughout the  discussion, the patient kept saying that she already has a psychiatrist but  again I explained to her that I was unsure whether or not her current  psychiatrist is actually an addictionologist as well and I have asked her to  discuss that with him.  The patient denies any new neurologic changes.  I  reviewed the health and history form and 14-point review of systems.  She  basically checks nearly every single thing under review of systems.   PHYSICAL  EXAMINATION:  VITAL SIGNS:  Her blood pressure today is 148/75,  pulse 87, respiratory rate 20, O2 saturation 98% on room air.  NEUROLOGIC:  Examination is intact in the upper and lower extremities  including motor, sensory and reflexes again today.  There is tenderness to  palpation diffusely in the upper and lower extremities.   IMPRESSION:  1. Chronic low back pain.  2. Post laminectomy syndrome.  3. Bilateral knee pain.  4. Bilateral shoulder pain.  5. Chronic pain syndrome.  6. History of drug addiction.   PLAN:  1. I spent approximately 15 minutes discussing treatment options with the     patient.  At this time, I wish to increase  Mobic to 7.5 mg b.i.d.  We     will begin Robaxin 500 mg one p.o. b.i.d. as needed, #30 without refills.  2. The patient is not a candidate at this time for chronic opioid therapy     given her history of drug addiction and noncompliance.  In addition, I     think she has a significant psychological overlay which needs to be     addressed by the psychiatrist.  I still think she would benefit from an     evaluation by an addictionologist.  3. The patient is to return to clinic in three months for reevaluation.   The patient was educated on the above findings and recommendations and  understands.  There were no barriers to communication.                                               Zachary George, DO    JW/MEDQ  D:  11/03/2002  T:  11/03/2002  Job:  [id number]   cc:   Donalee Citrin, M.D.  [address]  [place]  [place] [address]  Fax: [phone]

## 2011-02-09 NOTE — Op Note (Signed)
NAMEBEVERLEY, ALLENDER                            ACCOUNT NO.:  [id number]   MEDICAL RECORD NO.:  [id number]                   PATIENT TYPE:  OIB   LOCATION:  2899                                 FACILITY:  [facility]   PHYSICIAN:  Burnard Bunting, M.D.                 DATE OF BIRTH:  [birth year]   DATE OF PROCEDURE:  03/30/2003  DATE OF DISCHARGE:                                 OPERATIVE REPORT   PREOPERATIVE DIAGNOSES:  1. Left carpal tunnel syndrome.  2. Left trigger thumb.   POSTOPERATIVE DIAGNOSES:  1. Left carpal tunnel syndrome.  2. Left trigger thumb.   PROCEDURES:  1. Left carpal tunnel release.  2. Left trigger thumb release.   SURGEON:  Burnard Bunting, M.D.   ANESTHESIA:  General endotracheal.   ESTIMATED BLOOD LOSS:  5 mL.   DRAINS:  None.   TOURNIQUET TIME:  35 minutes at 250 mmHg.   PROCEDURE IN DETAIL:  The patient was brought to the operating room, where  general endotracheal anesthesia was induced, preoperative IV antibiotics  were administered.  The left hand was prepped with Duraprep solution and  draped in a sterile manner.  The arm was elevated and exsanguinated with the  Esmarch wrap.  The tourniquet was inflated.  Kaplan's cardinal line was  identified.  An incision was made from the radial border of the ring finger  beginning at the intersection of Kaplan's cardinal line and that border down  to the distal wrist flexion crease.  Skin and subcutaneous tissue were  sharply divided.  The palmar fascia was encountered and divided.  The  palmaris brevis was encountered and divided.  The transverse carpal ligament  was identified at its proximal and distal extent.  A small incision was made  at is midportion over 3-4 mm.  A Freer elevator was placed underneath the  transverse carpal ligament, which was then divided under direct  visualization to its proximal and distal extent.  The ulnar neurovascular  bundle was identified and protected.  The transverse carpal  ligament was  split on the ulnar aspect of the palmaris longus tendon.  Bleeding points  encountered were controlled using bipolar electrocautery.  At this time an  incision was made at the MCP flexion crease of the thumb.  The skin only was  divided using skin hooks.  The skin was elevated off the underlying soft  tissue, and then the incision was completed.  The digital nerves were  identified and retracted ulnarly and radially, respectively.  The proximal  A1 pulley was then identified with a right angle retractor.  It was then  divided under direct visualization.  No tumors or masses were noted within  the flexor tendon sheath.  The thumb moved freely without locking following  division of the proximal pulley.  The carpal tunnel was then inspected and  the motor branch was noted to  be intact.  No masses or cysts were noted  within the carpal tunnel.  At this time both incisions were irrigated.  Numbing medicine was placed into the skin using a 25-gauge needle.  The  numbing medicine was 0.5% Marcaine without epinephrine.  The tourniquet was  released.  Bleeding points encountered were controlled using bipolar  electrocautery.  The skin was closed using 3-0 nylon suture placed in a  simple fashion.  A bulky hand dressing was then applied.  The patient was  transferred to the recovery room in stable condition.  Total tourniquet time  was 35 minutes.                                               Burnard Bunting, M.D.    GSD/MEDQ  D:  03/30/2003  T:  03/30/2003  Job:  [id number]

## 2011-02-09 NOTE — Group Therapy Note (Signed)
NAME:  [name] [name], [name] [name]                            ACCOUNT NO.:  [id number]   MEDICAL RECORD NO.:  [id number]                   PATIENT TYPE:  OUT   LOCATION:  [facility]                           FACILITY:  [facility]   PHYSICIAN:  Argentina Donovan, MD                     DATE OF BIRTH:  [birth year]   DATE OF SERVICE:  09/09/2003                                    CLINIC NOTE   HISTORY OF PRESENT ILLNESS:  The patient is a 70 year old 236 pound black  female with extraordinary bad varicose veins causing symptoms of leg pain  and very flat feet that are giving her severe foot pain.  I previously  ordered Jobst support hose; however, because I did not write the proper  pressure gradient for the patient they would not give them to her.  I am  referring back after consulting with the sales people about how to order  that and also referring her to a podiatrist.  She has been to a vascular  surgeon who told her he cannot help her.  Her other problem is right knee  pain that needs a knee replacement which she plans on getting after the  first of the year.  Severe leg and foot pain secondary to vascular  insufficiency from varicose veins and loss of normal foot architecture.                                               Argentina Donovan, MD    PR/MEDQ  D:  09/09/2003  T:  09/10/2003  Job:  [id number]

## 2011-02-09 NOTE — Consult Note (Signed)
NAME:  [name] [name], [name] [name]                            ACCOUNT NO.:  [id number]   MEDICAL RECORD NO.:  [id number]                   PATIENT TYPE:   LOCATION:                                       FACILITY:   PHYSICIAN:  Sondra Come, D.O.                 DATE OF BIRTH:   DATE OF CONSULTATION:  DATE OF DISCHARGE:                                   CONSULTATION   HISTORY OF PRESENT ILLNESS:  [name] [name] returns to the clinic today for re-  evaluation. She is status post lumbar epidural steroid injection on July 02, 2002 for chronic low back pain with bilateral lower extremity radicular  symptoms secondary to post laminectomy syndrome. The patient states that she  has had nearly complete relief of her lower back pain. Today, she states  that she has no pain in her low back. Her function and quality of life  indices have improved. Her sleep is great. She states that she went to [facility] [employer] homecoming and might have overdone it a little bit but she  feels like the pain is now subsiding and again, she states that she has no  pain today. She continues performing her home exercise program and seems to  be fairly consistent with this. She has been discharged from PT secondary to  being independent with her program. She is no longer taking Hydrocodone for  pain. She seems to have a very good outlook which is also helping. I  reviewed the health and history form on a fourteen point review of systems.  No neurologic complaints.   PHYSICAL EXAMINATION:  GENERAL: Healthy female in no acute distress.  NEURO: Manual muscle testing is 5/5 bilateral lower extremities. Sensory  examination is intact to light touch bilateral lower extremities. Muscle  stretch reflexes are 0/4 bilateral lower extremities. There is no tenderness  to palpation bilateral lumbar paraspinals at this time.   IMPRESSION:  1. Chronic low back pain with bilateral lower extremity radicular symptoms,     essentially  resolved.  2. Post laminectomy syndrome.  3. History of spinal stenosis of the lumbar spine, status post L4-5     decompressive laminectomy and fusion.   PLAN:  1. Continue home exercise program.  2. Continue Hydrocodone just as needed and sparingly.  3. Would consider repeat lumbar epidural steroid injection if patient's     symptoms worsen and I discussed this with her.  4. To return to the clinic as needed.   The patient was educated on the above findings and recommendations and  understands. There were no barriers to communication.                                               Sondra Come,  D.O.    JJW/MEDQ  D:  07/22/2002  T:  07/23/2002  Job:  [id number]   cc:   Donalee Citrin, MD  [address]  [place]  [place] [address]  Fax: [phone]

## 2011-02-09 NOTE — Consult Note (Signed)
[facility]  Patient:    [name] [name], [name] [name] Visit Number: [id number] MRN: [id number]          Service Type: PMG Location: [facility]PC Attending Physician:  Sondra Come Dictated by:   Sondra Come, D.O. Proc. Date: 04/15/02 Admit Date:  03/25/2002                            Consultation Report  [name] [name] returns to clinic today sooner than scheduled for reevaluation. Today she is complaining of cramping and muscle spasms in her low back, especially on the right radiating anteriorly.  She describes her pain as a 10/10 on a subjective scale today.  She feels like she has a "pinched nerve." She denies any pain radiating into her lower extremities at this time.  In addition, she is unable to tolerate Celebrex as it caused her to have an upset stomach.  I reviewed health and history form and 14 point review of systems. No bowel and bladder dysfunction.  PHYSICAL EXAMINATION  GENERAL:  Healthy female in no acute distress.  VITAL SIGNS:  Blood pressure 146/67, pulse 69, respirations 12, O2 saturation 96% on room air.  BACK:  Significant tenderness to palpation bilateral lumbar paraspinals with mild spasm on the left.  Increased lumbar lordosis.  NEUROLOGIC:  Manual muscle testing is 5/5 bilateral lower extremities. Sensory examination is intact to light touch bilateral lower extremities. Muscle stretch reflexes are 0/4 bilateral lower extremities which is unchanged from previous examination.  IMPRESSION: 1. Low back pain with acute spasm. 2. Spinal stenosis of the lumbar spine status post laminectomy with reported    fusion. 3. Osteoarthritis bilateral knees.  PLAN: 1. Continue Ultracet one to two t.i.d. as needed. 2. Discontinue Celebrex and will give patient a trial of Bextra 10 mg one p.o.    q.d. #9 samples given.  If patient tolerates this and it is effective, will    call in a prescription.  The patient is to notify us in one week. 3. Will  prescribe Flexeril 5 mg one p.o. q.8h. as needed for spasm #30 with    one refill. 4. The patient is to return to clinic in two months for reevaluation.  The patient was educated on the above findings and recommendations and understands.  No barriers to communication.Dictated by:   Sondra Come, D.O.  Attending Physician:  Sondra Come DD:  04/15/02 TD:  04/19/02 Job: [id number]

## 2011-02-09 NOTE — Op Note (Signed)
[name], [name] [name]                ACCOUNT NO.:  [id number]   MEDICAL RECORD NO.:  [id number]          PATIENT TYPE:  OIB   LOCATION:  5021                         FACILITY:  [facility]   PHYSICIAN:  Burnard Bunting, M.D.    DATE OF BIRTH:  May 08, 1941   DATE OF PROCEDURE:  04/26/2005  DATE OF DISCHARGE:                                 OPERATIVE REPORT   PREOPERATIVE DIAGNOSIS:  Left wrist fracture.   POSTOPERATIVE DIAGNOSIS:  Left wrist fracture.   PROCEDURE:  Left wrist open reduction and internal fixation.   SURGEON:  Eric L. August Saucer, M.D.   ANESTHESIA:  General endotracheal anesthesia.   ESTIMATED BLOOD LOSS:  25 mL.   DRAINS:  TLSO x1.   TOURNIQUET TIME:  53 minutes at 250 mmHg.   DESCRIPTION OF PROCEDURE:  Patient brought to the operating room where  general endotracheal anesthesia was induced.  Preoperative antibiotics were  administered.  Left arm was prepped with DuraPrep and draped in sterile  manner.  Collier Flowers was used to cover the operating table.  The left arm was  elevated, exsanguinated, prepped and draped, tourniquet was inflated.  __________ distal radius was made.  Skin and subcutaneous tissue sharply  divided.  FCR tendon was identified.  Sheath was incised on that vein.  Posterior aspect of the FCR tendon and radial artery and venae comitantes  were retracted radially.  Pronator quadratus was detached off of the radial  border of the distal radius.  Fracture site was identified.  Fracture was  reduced and an Accumed distal radius plate was applied first by applying a  portable screw through the oval hole.  Correct alignment and reduction of  the fracture was confirmed in the AP and lateral plane.  Locking screws were  then placed through the seven distal hole fragments with correct link being  checked in the AP and lateral planes under fluoroscopy.  Good reduction was  achieved with aspiration of radial height inclination and tilt.  Two locking  screws were then placed  in the proximal aspect of the plate.  Tourniquet was  released.  Bleeding points encountered and controlled using bipolar  electrocautery.  Incision was thoroughly irrigated.  TLSO drain was placed.  Incision was closed using interrupted inverted 3-0 Vicryl suture followed by  simple 3-0 nylon sutures.  Bulky splint was applied.  The patient tolerated  procedure well without immediate complications.       GSD/MEDQ  D:  04/26/2005  T:  04/27/2005  Job:  [id number]

## 2011-02-09 NOTE — Consult Note (Signed)
[name] [name], [name] [name]                            ACCOUNT NO.:  [id number]   MEDICAL RECORD NO.:  [id number]                   PATIENT TYPE:  REC   LOCATION:  [facility]PC                                  FACILITY:  [facility]   PHYSICIAN:  Sondra Come, D.O.                 DATE OF BIRTH:  05/21/1941   DATE OF CONSULTATION:  08/14/2002  DATE OF DISCHARGE:                                   CONSULTATION   HISTORY OF PRESENT ILLNESS:  The patient returns to the clinic today as  scheduled for a lumbar epidural steroid injection.  She was last seen on  August 10, 2002.  She has degenerative disk disease of the lumbar spine  with postlaminectomy syndrome, status post L4-5 decompressive laminectomy  and fusion secondary to spinal stenosis.  She continues to have low back  pain radiating into her left buttock, which were relieved significantly with  a lumbar epidural steroid injection on July 02, 2002.  The patient denies  any bowel and bladder dysfunction.   PHYSICAL EXAMINATION:  GENERAL:  Healthy female in no acute distress.  VITAL SIGNS:  Blood pressure 148/75, pulse 65, respirations 20, O2  saturation 97% on room air.   IMPRESSION:  1. Chronic low back pain with left lower extremity radicular symptoms,     improved overall, but the patient's pain seems to be worsening over the     past few weeks.  Previous symptoms significantly relieved by lumbar     epidural steroid injection x 1 greater than one month ago.  2. Postlaminectomy syndrome.  3. History of spinal stenosis of the lumbar spine status post L4-5     decompressive laminectomy and fusion.   PLAN:  1. Repeat lumbar epidural steroid injection.  2. Continue Mobic.  3. Continue home exercise program.  4. Patient to return to clinic in one month for reevaluation and possible     repeat injection as predicated upon the patient's response to symptoms.     Patient instructed to return to clinic sooner as needed.   PROCEDURE:  Lumbar  epidural steroid injection.  The procedure was described  to the patient in detail including risks, benefits, limitations, and  alternatives.  The patient wishes to proceed.  Informed consent was  obtained.  The patient was brought back to the fluoroscopy suite and placed  on the table in prone position.  The skin was prepped and draped in the  usual sterile fashion.  The skin and subcutaneous tissues were anesthetized  with 3 cc of preservative-free 1% lidocaine.  Under direct fluoroscopic  guidance an 18-gauge, 3-1/2-inch Touhy needle was advanced into the right  paramedian L5-S1 epidural space with loss of resistance technique.  No CSF,  hemoparesthesias were noted.  This was then followed by injection of 1.5 cc  of Kenalog 40 mg/cc plus 2.5 cc of normal saline with needle flush.  No  complications.  The patient was monitored and released in stable condition.   The patient was educated on the above findings and recommendations and  understands.  There were no barriers to communication.                                               Sondra Come, D.O.    JJW/MEDQ  D:  08/14/2002  T:  08/15/2002  Job:  [id number]   cc:   Donalee Citrin, M.D.  [address]  [place]  [place] [address]  Fax: [phone]

## 2011-02-12 ENCOUNTER — Encounter: Payer: Medicare Other | Admitting: Physical Therapy

## 2011-02-13 ENCOUNTER — Other Ambulatory Visit: Payer: Self-pay | Admitting: Oncology

## 2011-02-13 ENCOUNTER — Encounter (HOSPITAL_BASED_OUTPATIENT_CLINIC_OR_DEPARTMENT_OTHER): Payer: Medicare Other | Admitting: Oncology

## 2011-02-13 DIAGNOSIS — D649 Anemia, unspecified: Secondary | ICD-10-CM

## 2011-02-13 DIAGNOSIS — I8390 Asymptomatic varicose veins of unspecified lower extremity: Secondary | ICD-10-CM

## 2011-02-13 DIAGNOSIS — I471 Supraventricular tachycardia: Secondary | ICD-10-CM

## 2011-02-13 DIAGNOSIS — I2699 Other pulmonary embolism without acute cor pulmonale: Secondary | ICD-10-CM

## 2011-02-14 ENCOUNTER — Emergency Department (HOSPITAL_COMMUNITY)
Admission: EM | Admit: 2011-02-14 | Discharge: 2011-02-14 | Disposition: A | Discharge status: Home / Self Care | Payer: Medicare Other | Attending: Emergency Medicine | Admitting: Emergency Medicine

## 2011-02-14 DIAGNOSIS — Z79899 Other long term (current) drug therapy: Secondary | ICD-10-CM | POA: Insufficient documentation

## 2011-02-14 DIAGNOSIS — G8929 Other chronic pain: Secondary | ICD-10-CM | POA: Insufficient documentation

## 2011-02-14 DIAGNOSIS — Z86718 Personal history of other venous thrombosis and embolism: Secondary | ICD-10-CM | POA: Insufficient documentation

## 2011-02-14 DIAGNOSIS — M546 Pain in thoracic spine: Secondary | ICD-10-CM | POA: Insufficient documentation

## 2011-02-14 DIAGNOSIS — F341 Dysthymic disorder: Secondary | ICD-10-CM | POA: Insufficient documentation

## 2011-02-14 DIAGNOSIS — I10 Essential (primary) hypertension: Secondary | ICD-10-CM | POA: Insufficient documentation

## 2011-02-14 DIAGNOSIS — Z7901 Long term (current) use of anticoagulants: Secondary | ICD-10-CM | POA: Insufficient documentation

## 2011-02-15 ENCOUNTER — Encounter: Payer: Medicare Other | Admitting: Physical Therapy

## 2011-02-22 ENCOUNTER — Emergency Department (HOSPITAL_COMMUNITY): Payer: Medicare Other

## 2011-02-22 ENCOUNTER — Other Ambulatory Visit: Payer: Self-pay | Admitting: Oncology

## 2011-02-22 ENCOUNTER — Inpatient Hospital Stay (HOSPITAL_COMMUNITY)
Admission: EM | Admit: 2011-02-22 | Discharge: 2011-02-24 | DRG: 312 | Disposition: A | Discharge status: Home / Self Care | Payer: Medicare Other | Attending: Internal Medicine | Admitting: Internal Medicine

## 2011-02-22 ENCOUNTER — Telehealth: Payer: Self-pay | Admitting: Family Medicine

## 2011-02-22 ENCOUNTER — Encounter (HOSPITAL_BASED_OUTPATIENT_CLINIC_OR_DEPARTMENT_OTHER): Payer: Medicare Other | Admitting: Oncology

## 2011-02-22 ENCOUNTER — Encounter: Payer: Self-pay | Admitting: Family Medicine

## 2011-02-22 DIAGNOSIS — I471 Supraventricular tachycardia, unspecified: Secondary | ICD-10-CM

## 2011-02-22 DIAGNOSIS — Z96659 Presence of unspecified artificial knee joint: Secondary | ICD-10-CM

## 2011-02-22 DIAGNOSIS — F341 Dysthymic disorder: Secondary | ICD-10-CM | POA: Diagnosis present

## 2011-02-22 DIAGNOSIS — G8929 Other chronic pain: Secondary | ICD-10-CM | POA: Diagnosis present

## 2011-02-22 DIAGNOSIS — I8390 Asymptomatic varicose veins of unspecified lower extremity: Secondary | ICD-10-CM

## 2011-02-22 DIAGNOSIS — I2699 Other pulmonary embolism without acute cor pulmonale: Secondary | ICD-10-CM

## 2011-02-22 DIAGNOSIS — Z7901 Long term (current) use of anticoagulants: Secondary | ICD-10-CM

## 2011-02-22 DIAGNOSIS — Y92410 Unspecified street and highway as the place of occurrence of the external cause: Secondary | ICD-10-CM

## 2011-02-22 DIAGNOSIS — Z79899 Other long term (current) drug therapy: Secondary | ICD-10-CM

## 2011-02-22 DIAGNOSIS — R5381 Other malaise: Secondary | ICD-10-CM | POA: Diagnosis present

## 2011-02-22 DIAGNOSIS — R42 Dizziness and giddiness: Secondary | ICD-10-CM | POA: Diagnosis present

## 2011-02-22 DIAGNOSIS — I10 Essential (primary) hypertension: Secondary | ICD-10-CM | POA: Diagnosis present

## 2011-02-22 DIAGNOSIS — I2789 Other specified pulmonary heart diseases: Secondary | ICD-10-CM | POA: Diagnosis present

## 2011-02-22 DIAGNOSIS — E860 Dehydration: Secondary | ICD-10-CM | POA: Diagnosis present

## 2011-02-22 DIAGNOSIS — Z981 Arthrodesis status: Secondary | ICD-10-CM

## 2011-02-22 DIAGNOSIS — D509 Iron deficiency anemia, unspecified: Secondary | ICD-10-CM | POA: Diagnosis present

## 2011-02-22 DIAGNOSIS — Z86711 Personal history of pulmonary embolism: Secondary | ICD-10-CM

## 2011-02-22 DIAGNOSIS — E669 Obesity, unspecified: Secondary | ICD-10-CM | POA: Diagnosis present

## 2011-02-22 DIAGNOSIS — N3000 Acute cystitis without hematuria: Secondary | ICD-10-CM | POA: Diagnosis present

## 2011-02-22 DIAGNOSIS — G4733 Obstructive sleep apnea (adult) (pediatric): Secondary | ICD-10-CM | POA: Diagnosis present

## 2011-02-22 DIAGNOSIS — Y9301 Activity, walking, marching and hiking: Secondary | ICD-10-CM

## 2011-02-22 DIAGNOSIS — D649 Anemia, unspecified: Secondary | ICD-10-CM

## 2011-02-22 DIAGNOSIS — M1990 Unspecified osteoarthritis, unspecified site: Secondary | ICD-10-CM | POA: Diagnosis present

## 2011-02-22 DIAGNOSIS — R55 Syncope and collapse: Principal | ICD-10-CM | POA: Diagnosis present

## 2011-02-22 DIAGNOSIS — S0003XA Contusion of scalp, initial encounter: Secondary | ICD-10-CM | POA: Diagnosis present

## 2011-02-22 DIAGNOSIS — G43909 Migraine, unspecified, not intractable, without status migrainosus: Secondary | ICD-10-CM | POA: Diagnosis present

## 2011-02-22 DIAGNOSIS — W1809XA Striking against other object with subsequent fall, initial encounter: Secondary | ICD-10-CM | POA: Diagnosis present

## 2011-02-22 DIAGNOSIS — M549 Dorsalgia, unspecified: Secondary | ICD-10-CM | POA: Diagnosis present

## 2011-02-22 DIAGNOSIS — J45909 Unspecified asthma, uncomplicated: Secondary | ICD-10-CM | POA: Diagnosis present

## 2011-02-22 LAB — COMPREHENSIVE METABOLIC PANEL
ALT: 13 U/L (ref 0–35)
Alkaline Phosphatase: 113 U/L (ref 39–117)
BUN: 12 mg/dL (ref 6–23)
CO2: 32 mEq/L (ref 19–32)
GFR calc non Af Amer: >60 mL/min (ref >60)
Glucose, Bld: 102 mg/dL — ABNORMAL HIGH (ref 70–99)
Potassium: 4.3 mEq/L (ref 3.5–5.1)
Total Protein: 8 g/dL (ref 6.0–8.3)

## 2011-02-22 LAB — PROTIME-INR
INR: 3.09 — ABNORMAL HIGH (ref 0.00–1.49)
Prothrombin Time: 31.9 seconds — ABNORMAL HIGH (ref 11.6–15.2)

## 2011-02-22 LAB — CBC
HCT: 34.6 % — ABNORMAL LOW (ref 36.0–46.0)
MCV: 91.1 fL (ref 78.0–100.0)
Platelets: 251 10*3/uL (ref 150–400)
RBC: 3.8 MIL/uL — ABNORMAL LOW (ref 3.87–5.11)
WBC: 7.8 10*3/uL (ref 4.0–10.5)

## 2011-02-22 LAB — URINALYSIS, ROUTINE W REFLEX MICROSCOPIC
Glucose, UA: NEGATIVE mg/dL
Ketones, ur: NEGATIVE mg/dL
Nitrite: NEGATIVE
Protein, ur: NEGATIVE mg/dL
Urobilinogen, UA: 0.2 mg/dL (ref 0.0–1.0)

## 2011-02-22 LAB — APTT: aPTT: 52 seconds — ABNORMAL HIGH (ref 24–37)

## 2011-02-22 LAB — CK TOTAL AND CKMB (NOT AT ARMC)
CK, MB: 6 ng/mL — ABNORMAL HIGH (ref 0.3–4.0)
Relative Index: 2 (ref 0.0–2.5)

## 2011-02-22 LAB — URINE MICROSCOPIC-ADD ON

## 2011-02-22 LAB — DIFFERENTIAL
Basophils Absolute: 0 10*3/uL (ref 0.0–0.1)
Eosinophils Absolute: 0.2 10*3/uL (ref 0.0–0.7)
Lymphocytes Relative: 26 % (ref 12–46)
Lymphs Abs: 2.1 10*3/uL (ref 0.7–4.0)
Neutrophils Relative %: 64 % (ref 43–77)

## 2011-02-22 NOTE — Progress Notes (Unsigned)
Family Medicine Teaching [facility] Admission History and Physical  Patient name: [name] [name] Medical record number: [id number] Date of birth: March 04, 1941 Age: 70 y.o. Gender: female  Primary Care Provider: No primary provider on file.  Chief Complaint: *** History of Present Illness: [name] [name] is a 70 y.o. year old female presenting with ***  Patient Active Problem List  Diagnoses  . HYPERLIPIDEMIA  . OBESITY, UNSPECIFIED  . SCHIZOPHRENIA  . DEPRESSION  . OTHER CHRONIC PAIN  . Essential hypertension, benign  . PULMONARY EMBOLISM  . ASTHMA, PERSISTENT  . CPK, ABNORMAL  . Counseling on health promotion and disease prevention  . Itching  . Lung nodule  . Encounter for long-term (current) use of anticoagulants  . Varicose veins of both lower extremities with pain  . GERD  . Family history of malignant neoplasm of gastrointestinal tract  . Iron deficiency anemia, unspecified  . Heterozygous factor V Leiden mutation  . Anemia, normocytic normochromic   Past Medical History: Past Medical History  Diagnosis Date  . Depression   . Schizophrenia   . Arthritis   . Hyperlipidemia   . Hypertension   . Chronic pain   . Pulmonary embolism   . DVT (deep venous thrombosis)     per 01/17/10 d/c summary- "remote hx of dvt"?  . Iron deficiency anemia   . Chronic headaches   . Sleep apnea   . Glaucoma   . Hemorrhoids   . Asthma   . Anxiety     Past Surgical History: Past Surgical History  Procedure Date  . Spinal fusion     2004  . Joint replacement right knee 11/2008  . Tubal ligation year 9  . Carpal tunnel release     bilateral     Social History: History   Social History  . Marital Status: Married    Spouse Name: N/A    Number of Children: N/A  . Years of Education: N/A   Occupational History  . 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 Smoking status: Never Smoker   . Smokeless tobacco: Never Used  . Alcohol Use: No  . Drug  Use: No  . Sexually Active: Yes    Birth Control/ Protection: Post-menopausal   Other Topics Concern  . Not on file   Social History Narrative   Lives with her husband, [name], and her son, [name].  Has 2 other children.  [name] is mentally handicapped and has a severe seizure disorder.  SHe is primary caretaker for him.    Family History: Family History  Problem Relation Age of Onset  . Diabetes Sister   . Colon cancer Brother     Allergies: No Known Allergies  Current Outpatient Prescriptions  Medication Sig Dispense Refill  . albuterol (VENTOLIN HFA) 108 (90 BASE) MCG/ACT inhaler Inhale 2 puffs into the lungs every 4 (four) hours as needed. For shortness of breath or wheezing.       Marland Kitchen atenolol (TENORMIN) 100 MG tablet Take 100 mg by mouth daily.        . clonazePAM (KLONOPIN) 0.5 MG tablet Take 0.5 mg by mouth 2 (two) times daily.        Marland Kitchen esomeprazole (NEXIUM) 40 MG capsule Take 40 mg by mouth daily. For reflux       . ferrous sulfate 325 (65 FE) MG tablet Take 1 tablet (325 mg total) by mouth 2 (two) times daily with a meal.  60  tablet  3  . FLUoxetine (PROZAC) 20 MG tablet 3 tabs by mouth every morning       . fluticasone (FLOVENT HFA) 220 MCG/ACT inhaler Inhale 1 puff into the lungs 2 (two) times daily.        . hydrochlorothiazide 25 MG tablet Take 25 mg by mouth daily. For high blood pressure       . meclizine (ANTIVERT) 25 MG tablet Take 25 mg by mouth every 8 (eight) hours as needed. For dizziness       . potassium chloride (KLOR-CON) 10 MEQ CR tablet Take 2 tabs by mouth daily.       . traMADol (ULTRAM) 50 MG tablet Take 1 tablet (50 mg total) by mouth every 8 (eight) hours as needed for pain.  60 tablet  0  . warfarin (COUMADIN) 5 MG tablet as directed.         Review Of Systems: Per HPI with the following additions: *** Otherwise 12 point review of systems was performed and was unremarkable.  Physical Exam: Pulse: ***  Blood Pressure: ***/*** RR: ***   O2: ***  on *** Temp: ***  General: {Exam; general:16600} HEENT: {exam; heent:31974} Heart: {EXAM; EAVWU:98119} Lungs: {pe lungs:310571::"clear to auscultation","no wheezes or rales","unlabored breathing"} Abdomen: {Exam; abdomen:5259::"abdomen is soft without significant tenderness, masses, organomegaly or guarding"} Extremities: {Exam; extremity:5109} Skin:{Exam; JYNW:29562} Neurology: {exam; neuro:5902::"normal without focal findings","mental status, speech normal, alert and oriented x3","PERLA","reflexes normal and symmetric"}  Labs and Imaging: Lab Results  Component Value Date/Time   NA 134* 02/22/2011  3:21 PM   K 4.3 02/22/2011  3:21 PM   CL 96 02/22/2011  3:21 PM   CO2 32 02/22/2011  3:21 PM   BUN 12 02/22/2011  3:21 PM   CREATININE 0.92 02/22/2011  3:21 PM   CREATININE 0.91 11/08/2010  3:18 PM   GLUCOSE 102* 02/22/2011  3:21 PM   Lab Results  Component Value Date   WBC 7.8 02/22/2011   HGB 11.6* 02/22/2011   HCT 34.6* 02/22/2011   MCV 91.1 02/22/2011   PLT 251 02/22/2011   ***   Assessment and Plan: [name] [name] is a 70 y.o. year old female presenting with *** 1. *** 2. FEN/GI: *** 3. Prophylaxis: *** 4. Disposition: ***

## 2011-02-22 NOTE — H&P (Signed)
NAMEBRITTINEY, [name] [name]                ACCOUNT NO.:  [id number]  MEDICAL RECORD NO.:  [id number]           PATIENT TYPE:  E  LOCATION:  [facility]                         FACILITY:  [facility]  PHYSICIAN:  Talmage Nap, MD  DATE OF BIRTH:  Feb 17, 1941  DATE OF ADMISSION:  02/22/2011 DATE OF DISCHARGE:                             HISTORY & PHYSICAL   History obtainable from the patient.  PRIMARY CARE PHYSICIAN:  Fleet Contras, MD  CHIEF COMPLAINT:  Passed out at [facility] at about 12 midday.  HISTORY OF PRESENT ILLNESS:  The patient is a 70 year old African American female with multiple medical problems, who was said to have been in fairly stable health until about 12 midday today when she was dropped off by her spouse at [address].She at this point observed that her knees were trying to buckled and all of the sudden the patient claims she passed out and hit the back of her head on the concrete floor.  She denied any premonitory symptoms prior to passing out apart from the knee buckling.  She denied any chest pain.  She denied any shortness of breath. She denied any nausea or vomiting.  She also denied any history of urinary or fecal incontinence.  Her passing out was said to be very transient and subsequently was brought to the emergency room by Emergency Medical Service for evaluation.  PAST MEDICAL HISTORY:  Positive for; 1. Hypertension. 2. Anxiety disorder. 3. Chronic back pain. 4. Asthma. 5. Depression. 6. Migraine headaches. 7. Obesity. 8. Pulmonary embolism. 9. Obstructive sleep apnea. 10.Chronic vertigo. 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 12.Degenerative joint disease. 13.Anemia. 14.Deconditioning, ambulates with the help of a cane.  PAST SURGICAL HISTORY: 1. Spinal fusion surgery. 2. Right total knee replacement. 3. Varicosity of the lower extremity, status post laser treatment. 4. Degenerative joint disease with periodic injection of steroids in     the  major joints once every year  Her preadmission med without dosages include; 1. Coumadin. 2. Nexium. 3. Ferrous sulfate. 4. Hydrochlorothiazide. 5. Meclizine. 6. Albuterol inhaler.  SOCIAL HISTORY:  Negative for alcohol or tobacco use.  The patient is retired.  FAMILY HISTORY:  Son has epilepsy, however, no family history of coronary artery disease or CVA.  REVIEW OF SYSTEMS:  The patient complained of slight discomfort at the back of her head .  She denies any frank headache. No blurred vision.  No nausea or vomiting.  No fever.  No chills.  No rigor.  No chest pain or shortness of breath.  Denies any cough.  No PND or orthopnea.  No abdominal discomfort.  No diarrhea or hematochezia. No dysuria or hematuria.  She has periodic swelling of the lower extremity with varicosities and also has pain in the major joints.  ALLERGIES:  Vicodin.  PHYSICAL EXAMINATION:  GENERAL:  An elderly lady, not in any  respiratory distress has suboptimal hydration. VITAL SIGNS:  Blood pressure is 121/73, pulse is 97, respiratory 20 rate, and temperature is 98.5. HEENT:  Mild pallor.  Pupils are reactive to light and extraocular muscles are intact.  She has slight swelling at the occipital  region. NECK:  No jugular venous distention.  No carotid bruit.  No lymphadenopathy. CHEST:  Clear to auscultation.  Heart sounds one and two. ABDOMEN:  Obese and nontender.  Liver, spleen, and kidney not palpable. Bowel sounds are positive. EXTREMITIES:  Trace edema with varicosities. NEUROPSYCHIATRIC:  Did not show any neuro lateralizing sign. SKIN:  Slightly decreased turgor. MUSCULOSKELETAL SYSTEM:  Arthritic change in knees and feet.  LABORATORY DATA:  Urinalysis done on the patient showed urine wbc 7-10, rare bacteria, and microscopy showed negative nitrite with moderate leukocyte esterase.  First set of cardiac markers troponin-I less than 0.30, CK-MB 6.0, CK 307.  Hematological indices showed WBC of  7.8, hemoglobin 11.6, hematocrit of 34.6, MCV of 91.1 with a platelet count of 251, normal differential.  Coagulation profile showed PT 31.9, INR 3.09, and APTT of 52.  Chemistry shows sodium of 134, potassium of 4.33, chloride of 96, bicarb of 32, glucose is 102, BUN is 12, creatinine 0.92.  LFT normal.  EKG shows sinus tachycardia with a rate of 100.  No acute ST-wave change noted but LVH.  Imaging studies done include chest x-ray which showed no acute cardiopulmonary findings.  X-ray of the knee showed advanced tricompartmental degenerative changes with reduced joint space and osteophytic spurring, chondrocalcinosis also noted.  There is no acute fracture seen.  CT of the head without contrast showed small scalp hematoma,  otherwise, no acute intracranial findings.  CT of the cervical spine showed advanced degenerative cervical spondylosis, no acute cervical spine fracture seen.  IMPRESSION: 1. Syncope. 2. Scalp hematoma, very small. 3. Dehydration. 4. History of pulmonary embolism on anticoagulation with Coumadin. 5. Asthma, clinically stable. 6. Pulmonary hypertension. 7. Obstructive sleep apnea. 8. Anemia on iron replacement. 9. Chronic vertigo. 10.Degenerative joint disease/deconditioning, ambulates with a cane     and periodic steroid injection in the joints. 11.Hypertension. 12.Obesity. 13.Depression. 14.History of migraine headache. 15.Asymptomatic bacteriuria.  PLAN:  Plan is to admit the patient to Telemetry.  The patient will  be slowly rehydrated with half-normal saline IV to go at rate of 65 mL/hour.  She will be given aspirin 81 mg p.o. daily and then Dilaudid 1 mg IV q.4 p.r.n. for pain control.  Other medication to be given to the patient will include meclizine 25 mg p.o. t.i.d., Lopressor 25 mg p.o. b.i.d., and she also be on ferrous sulfate 325 mg p.o. b.i.d.  The patient will be treated for asymptomatic bacteriuria with ciprofloxacin 500mg  1 p.o. b.i.d.  for 3 days and she will be given CPAP at night titrated for her obstructive sleep apnea.  GI prophylaxis will be  with Protonix 40 mg p.o. daily and she will be on Coumadin p.o., dosing to be done by pharmacy and physical therapy will be consulted for gradual ambulation of this patient. Additional lab work to be ordered on this patient will include cardiac enzymes q.6 x3, CBC, CMP, and magnesium will be repeated in a.m.  PT, PTT and INR will also be repeated in a.m.  Imaging studies to be ordered will include 2-D echo and carotid duplex.  The patient will be followed and evaluated on a daily basis.     Talmage Nap, MD     CN/MEDQ  D:  02/22/2011  T:  02/22/2011  Job:  [id number]  Electronically Signed by Talmage Nap  on 02/22/2011 09:56:37 PM

## 2011-02-22 NOTE — Telephone Encounter (Signed)
I was called to admit pt on inpatient service.  After discussion with pt I realized that she has transferred her care to [facility]. They were notified by ER doctor to admit patient.   I will route this note to Dennison Nancy to make sure that this is noted in chart.

## 2011-02-23 DIAGNOSIS — R55 Syncope and collapse: Secondary | ICD-10-CM

## 2011-02-23 DIAGNOSIS — I369 Nonrheumatic tricuspid valve disorder, unspecified: Secondary | ICD-10-CM

## 2011-02-23 LAB — CARDIAC PANEL(CRET KIN+CKTOT+MB+TROPI)
CK, MB: 4.6 ng/mL — ABNORMAL HIGH (ref 0.3–4.0)
CK, MB: 3.7 ng/mL (ref 0.3–4.0)
Relative Index: 1.4 (ref 0.0–2.5)
Relative Index: 1.2 (ref 0.0–2.5)
Total CK: 326 U/L — ABNORMAL HIGH (ref 7–177)
Troponin I: <0.3 ng/mL (ref <0.30)

## 2011-02-23 LAB — DIFFERENTIAL
Basophils Absolute: 0 10*3/uL (ref 0.0–0.1)
Basophils Relative: 0 % (ref 0–1)
Lymphocytes Relative: 15 % (ref 12–46)
Monocytes Absolute: 0.5 10*3/uL (ref 0.1–1.0)
Neutro Abs: 5.8 10*3/uL (ref 1.7–7.7)
Neutrophils Relative %: 77 % (ref 43–77)

## 2011-02-23 LAB — COMPREHENSIVE METABOLIC PANEL
Alkaline Phosphatase: 106 U/L (ref 39–117)
BUN: 10 mg/dL (ref 6–23)
Chloride: 96 mEq/L (ref 96–112)
Creatinine, Ser: 0.78 mg/dL (ref 0.4–1.2)
Glucose, Bld: 116 mg/dL — ABNORMAL HIGH (ref 70–99)
Potassium: 3.8 mEq/L (ref 3.5–5.1)
Total Bilirubin: 0.3 mg/dL (ref 0.3–1.2)
Total Protein: 8 g/dL (ref 6.0–8.3)

## 2011-02-23 LAB — PROTIME-INR
INR: 2.9 — ABNORMAL HIGH (ref 0.00–1.49)
Prothrombin Time: 30.4 seconds — ABNORMAL HIGH (ref 11.6–15.2)

## 2011-02-23 LAB — CBC
HCT: 34.6 % — ABNORMAL LOW (ref 36.0–46.0)
Hemoglobin: 11.9 g/dL — ABNORMAL LOW (ref 12.0–15.0)
MCHC: 34.4 g/dL (ref 30.0–36.0)
RBC: 3.79 MIL/uL — ABNORMAL LOW (ref 3.87–5.11)
WBC: 7.4 10*3/uL (ref 4.0–10.5)

## 2011-02-23 LAB — MAGNESIUM: Magnesium: 2.1 mg/dL (ref 1.5–2.5)

## 2011-02-23 NOTE — Telephone Encounter (Signed)
This patient now goes to [facility].  Please remove [facility] as her medical home  Thanks!  KF

## 2011-02-24 LAB — BASIC METABOLIC PANEL
BUN: 10 mg/dL (ref 6–23)
Calcium: 9.3 mg/dL (ref 8.4–10.5)
GFR calc non Af Amer: >60 mL/min (ref >60)
Glucose, Bld: 101 mg/dL — ABNORMAL HIGH (ref 70–99)

## 2011-02-24 LAB — PROTIME-INR
INR: 2.66 — ABNORMAL HIGH (ref 0.00–1.49)
Prothrombin Time: 28.4 seconds — ABNORMAL HIGH (ref 11.6–15.2)

## 2011-03-05 ENCOUNTER — Other Ambulatory Visit: Payer: Self-pay | Admitting: Oncology

## 2011-03-05 ENCOUNTER — Emergency Department (HOSPITAL_COMMUNITY)
Admission: EM | Admit: 2011-03-05 | Discharge: 2011-03-05 | Disposition: A | Discharge status: Home / Self Care | Payer: Medicare Other | Attending: Emergency Medicine | Admitting: Emergency Medicine

## 2011-03-05 ENCOUNTER — Encounter (HOSPITAL_BASED_OUTPATIENT_CLINIC_OR_DEPARTMENT_OTHER): Payer: Medicare Other | Admitting: Oncology

## 2011-03-05 DIAGNOSIS — G8929 Other chronic pain: Secondary | ICD-10-CM | POA: Insufficient documentation

## 2011-03-05 DIAGNOSIS — I10 Essential (primary) hypertension: Secondary | ICD-10-CM | POA: Insufficient documentation

## 2011-03-05 DIAGNOSIS — M549 Dorsalgia, unspecified: Secondary | ICD-10-CM | POA: Insufficient documentation

## 2011-03-05 DIAGNOSIS — I8390 Asymptomatic varicose veins of unspecified lower extremity: Secondary | ICD-10-CM

## 2011-03-05 DIAGNOSIS — F341 Dysthymic disorder: Secondary | ICD-10-CM | POA: Insufficient documentation

## 2011-03-05 DIAGNOSIS — I471 Supraventricular tachycardia: Secondary | ICD-10-CM

## 2011-03-05 DIAGNOSIS — Z86718 Personal history of other venous thrombosis and embolism: Secondary | ICD-10-CM | POA: Insufficient documentation

## 2011-03-05 DIAGNOSIS — D649 Anemia, unspecified: Secondary | ICD-10-CM

## 2011-03-05 DIAGNOSIS — I2699 Other pulmonary embolism without acute cor pulmonale: Secondary | ICD-10-CM

## 2011-03-05 DIAGNOSIS — Z79899 Other long term (current) drug therapy: Secondary | ICD-10-CM | POA: Insufficient documentation

## 2011-03-05 DIAGNOSIS — Z7901 Long term (current) use of anticoagulants: Secondary | ICD-10-CM | POA: Insufficient documentation

## 2011-03-05 DIAGNOSIS — M25569 Pain in unspecified knee: Secondary | ICD-10-CM | POA: Insufficient documentation

## 2011-03-05 LAB — PROTIME-INR: Protime: 27.6 Seconds — ABNORMAL HIGH (ref 10.6–13.4)

## 2011-03-12 ENCOUNTER — Other Ambulatory Visit: Payer: Self-pay | Admitting: Oncology

## 2011-03-12 ENCOUNTER — Encounter (HOSPITAL_BASED_OUTPATIENT_CLINIC_OR_DEPARTMENT_OTHER): Payer: Medicare Other | Admitting: Oncology

## 2011-03-12 DIAGNOSIS — D649 Anemia, unspecified: Secondary | ICD-10-CM

## 2011-03-12 DIAGNOSIS — I471 Supraventricular tachycardia: Secondary | ICD-10-CM

## 2011-03-12 DIAGNOSIS — I2699 Other pulmonary embolism without acute cor pulmonale: Secondary | ICD-10-CM

## 2011-03-12 DIAGNOSIS — I8390 Asymptomatic varicose veins of unspecified lower extremity: Secondary | ICD-10-CM

## 2011-03-27 ENCOUNTER — Emergency Department (HOSPITAL_COMMUNITY)
Admission: EM | Admit: 2011-03-27 | Discharge: 2011-03-27 | Disposition: A | Discharge status: Home / Self Care | Payer: Medicare Other | Attending: Emergency Medicine | Admitting: Emergency Medicine

## 2011-03-27 ENCOUNTER — Other Ambulatory Visit: Payer: Self-pay | Admitting: Oncology

## 2011-03-27 ENCOUNTER — Encounter (HOSPITAL_BASED_OUTPATIENT_CLINIC_OR_DEPARTMENT_OTHER): Payer: Medicare Other | Admitting: Oncology

## 2011-03-27 DIAGNOSIS — R51 Headache: Secondary | ICD-10-CM | POA: Insufficient documentation

## 2011-03-27 DIAGNOSIS — Z7901 Long term (current) use of anticoagulants: Secondary | ICD-10-CM

## 2011-03-27 DIAGNOSIS — D649 Anemia, unspecified: Secondary | ICD-10-CM

## 2011-03-27 DIAGNOSIS — I2699 Other pulmonary embolism without acute cor pulmonale: Secondary | ICD-10-CM

## 2011-03-27 DIAGNOSIS — E669 Obesity, unspecified: Secondary | ICD-10-CM | POA: Insufficient documentation

## 2011-03-27 DIAGNOSIS — I10 Essential (primary) hypertension: Secondary | ICD-10-CM | POA: Insufficient documentation

## 2011-03-27 DIAGNOSIS — I8390 Asymptomatic varicose veins of unspecified lower extremity: Secondary | ICD-10-CM

## 2011-03-27 DIAGNOSIS — I471 Supraventricular tachycardia: Secondary | ICD-10-CM

## 2011-03-27 DIAGNOSIS — S0003XA Contusion of scalp, initial encounter: Secondary | ICD-10-CM | POA: Insufficient documentation

## 2011-03-27 DIAGNOSIS — W19XXXA Unspecified fall, initial encounter: Secondary | ICD-10-CM | POA: Insufficient documentation

## 2011-03-27 LAB — PROTIME-INR
INR: 3.2 (ref 2.00–3.50)
Protime: 38.4 Seconds — ABNORMAL HIGH (ref 10.6–13.4)

## 2011-03-29 ENCOUNTER — Other Ambulatory Visit: Payer: Self-pay | Admitting: Internal Medicine

## 2011-03-29 DIAGNOSIS — N644 Mastodynia: Secondary | ICD-10-CM

## 2011-04-05 ENCOUNTER — Ambulatory Visit
Admission: RE | Admit: 2011-04-05 | Discharge: 2011-04-05 | Disposition: A | Discharge status: Home / Self Care | Payer: Medicare Other | Source: Ambulatory Visit | Attending: Internal Medicine | Admitting: Internal Medicine

## 2011-04-05 ENCOUNTER — Other Ambulatory Visit: Payer: Self-pay | Admitting: Internal Medicine

## 2011-04-05 DIAGNOSIS — N644 Mastodynia: Secondary | ICD-10-CM

## 2011-04-08 ENCOUNTER — Ambulatory Visit (HOSPITAL_BASED_OUTPATIENT_CLINIC_OR_DEPARTMENT_OTHER): Payer: Medicare Other | Attending: Internal Medicine

## 2011-04-08 DIAGNOSIS — R259 Unspecified abnormal involuntary movements: Secondary | ICD-10-CM | POA: Insufficient documentation

## 2011-04-08 DIAGNOSIS — G4733 Obstructive sleep apnea (adult) (pediatric): Secondary | ICD-10-CM | POA: Insufficient documentation

## 2011-04-14 DIAGNOSIS — R259 Unspecified abnormal involuntary movements: Secondary | ICD-10-CM

## 2011-04-14 DIAGNOSIS — G4733 Obstructive sleep apnea (adult) (pediatric): Secondary | ICD-10-CM

## 2011-04-14 NOTE — Procedures (Signed)
[name], [name]                ACCOUNT NO.:  [id number]  MEDICAL RECORD NO.:  [id number]          PATIENT TYPE:  OUT  LOCATION:  SLEEP CENTER                 FACILITY:  [facility]  PHYSICIAN:  Jakyrie Totherow D. Maple Hudson, MD, FCCP, FACPDATE OF BIRTH:  December 19[birth year] 1942  DATE OF STUDY:  04/08/2011                           NOCTURNAL POLYSOMNOGRAM  REFERRING PHYSICIAN:  EDWIN A AVBUERE  INDICATION FOR STUDY:  Hypersomnia with sleep apnea.  EPWORTH SLEEPINESS SCORE:  12/24.  BMI 39.9.  Weight 240 pounds, height 65 inches.  Neck 15 inches.  MEDICATIONS:  Home medications are charted and reviewed.  SLEEP ARCHITECTURE:  Split study protocol.  During the diagnostic phase total sleep time 148 minutes with sleep efficiency 71%.  Stage I was 10.8%, stage II 89.2%, stage III 0, REM 0, sleep latency 30 minutes, awake after sleep onset 30.5 minutes, arousal index 32.  Bedtime medication:  None.  RESPIRATORY DATA:  Split study protocol.  Apnea/hypopnea index (AHI) 15 per hour.  A total of 37 events was scored including 1 obstructive apnea and 36 hypopneas.  Events were seen in all sleep positions.  CPAP was then titrated to 10 cwp, AHI of 0 per hour.  She wore a medium ResMed Quattro FX mask with heated humidifier.  OXYGEN DATA:  Before CPAP snoring was moderate with oxygen desaturation to a nadir of 89% on room air.  With CPAP titration mean oxygen saturation held 95.5% on room air and snoring was prevented.  CARDIAC DATA:  Normal sinus rhythm.  MOVEMENT-PARASOMNIA:  Periodic limb movement with arousal.  During the diagnostic phase a total of 145 limb jerks were counted of which 42 were associated with arousal or awakening for an index of 17 per hour. During the titration phase only 28 events were counted of which 5 were associated with arousal or awakening for an index of 1.5 per hour. Bathroom x2.  IMPRESSION-RECOMMENDATIONS: 1. Mild-to-moderate obstructive sleep apnea/hypopnea syndrome, AHI 15  per hour with events in all sleep positions.  Moderate snoring and     oxygen desaturation to a nadir of 89% on room air. 2. Successful CPAP titration to 10 mL cwp, AHI 0 per hour.  She wore a     medium ResMed Quattro FX mask with heated humidifier. 3. Frequent limb jerks were noted.  These were markedly reduced during     the titration phase suggesting that many of them were related to     respiratory triggered arousals.  If she is determined to have     significant sleep     disturbance by limb jerks in the home environment after adjusting     CPAP, then consider specific therapy such as Requip or Mirapex if     clinically appropriate.     Samuele Storey D. Maple Hudson, MD, [facility], FACP Diplomate, Biomedical engineer of Sleep Medicine Electronically Signed    CDY/MEDQ  D:  04/14/2011 13:05:19  T:  04/14/2011 21:40:48  Job:  [id number]

## 2011-04-20 ENCOUNTER — Encounter (HOSPITAL_BASED_OUTPATIENT_CLINIC_OR_DEPARTMENT_OTHER): Payer: Medicare Other | Admitting: Oncology

## 2011-04-20 ENCOUNTER — Other Ambulatory Visit: Payer: Self-pay | Admitting: Oncology

## 2011-04-20 DIAGNOSIS — I2699 Other pulmonary embolism without acute cor pulmonale: Secondary | ICD-10-CM

## 2011-04-20 DIAGNOSIS — I471 Supraventricular tachycardia, unspecified: Secondary | ICD-10-CM

## 2011-04-20 DIAGNOSIS — Z7901 Long term (current) use of anticoagulants: Secondary | ICD-10-CM

## 2011-04-20 DIAGNOSIS — D649 Anemia, unspecified: Secondary | ICD-10-CM

## 2011-04-20 DIAGNOSIS — I8390 Asymptomatic varicose veins of unspecified lower extremity: Secondary | ICD-10-CM

## 2011-04-20 LAB — PROTIME-INR
INR: 3.2 (ref 2.00–3.50)
Protime: 38.4 Seconds — ABNORMAL HIGH (ref 10.6–13.4)

## 2011-04-22 ENCOUNTER — Emergency Department (HOSPITAL_COMMUNITY)
Admission: EM | Admit: 2011-04-22 | Discharge: 2011-04-22 | Disposition: A | Discharge status: Home / Self Care | Payer: Medicare Other | Attending: Emergency Medicine | Admitting: Emergency Medicine

## 2011-04-22 ENCOUNTER — Emergency Department (HOSPITAL_COMMUNITY): Payer: Medicare Other

## 2011-04-22 DIAGNOSIS — I498 Other specified cardiac arrhythmias: Secondary | ICD-10-CM | POA: Insufficient documentation

## 2011-04-22 DIAGNOSIS — R0602 Shortness of breath: Secondary | ICD-10-CM | POA: Insufficient documentation

## 2011-04-22 DIAGNOSIS — R0789 Other chest pain: Secondary | ICD-10-CM | POA: Insufficient documentation

## 2011-04-22 DIAGNOSIS — E669 Obesity, unspecified: Secondary | ICD-10-CM | POA: Insufficient documentation

## 2011-04-22 DIAGNOSIS — I10 Essential (primary) hypertension: Secondary | ICD-10-CM | POA: Insufficient documentation

## 2011-04-22 LAB — CK TOTAL AND CKMB (NOT AT ARMC)
CK, MB: 4.8 ng/mL — ABNORMAL HIGH (ref 0.3–4.0)
Total CK: 293 U/L — ABNORMAL HIGH (ref 7–177)

## 2011-04-22 LAB — BASIC METABOLIC PANEL
BUN: 13 mg/dL (ref 6–23)
CO2: 31 mEq/L (ref 19–32)
Calcium: 9.5 mg/dL (ref 8.4–10.5)
Creatinine, Ser: 0.75 mg/dL (ref 0.50–1.10)
GFR calc non Af Amer: >60 mL/min (ref >60)
Glucose, Bld: 96 mg/dL (ref 70–99)

## 2011-04-22 LAB — DIFFERENTIAL
Basophils Relative: 0 % (ref 0–1)
Eosinophils Absolute: 0.2 10*3/uL (ref 0.0–0.7)
Eosinophils Relative: 3 % (ref 0–5)
Lymphocytes Relative: 29 % (ref 12–46)
Monocytes Absolute: 0.3 10*3/uL (ref 0.1–1.0)
Neutrophils Relative %: 62 % (ref 43–77)

## 2011-04-22 LAB — CBC
MCV: 93.4 fL (ref 78.0–100.0)
Platelets: 226 10*3/uL (ref 150–400)
RBC: 3.79 MIL/uL — ABNORMAL LOW (ref 3.87–5.11)
RDW: 14.8 % (ref 11.5–15.5)
WBC: 5.3 10*3/uL (ref 4.0–10.5)

## 2011-04-22 LAB — PROTIME-INR: INR: 4.12 — ABNORMAL HIGH (ref 0.00–1.49)

## 2011-04-26 ENCOUNTER — Other Ambulatory Visit: Payer: Self-pay | Admitting: Family Medicine

## 2011-05-01 ENCOUNTER — Emergency Department (HOSPITAL_COMMUNITY)
Admission: EM | Admit: 2011-05-01 | Discharge: 2011-05-01 | Disposition: A | Discharge status: Home / Self Care | Payer: Medicare Other | Attending: Emergency Medicine | Admitting: Emergency Medicine

## 2011-05-01 DIAGNOSIS — E669 Obesity, unspecified: Secondary | ICD-10-CM | POA: Insufficient documentation

## 2011-05-01 DIAGNOSIS — R51 Headache: Secondary | ICD-10-CM | POA: Insufficient documentation

## 2011-05-01 DIAGNOSIS — I10 Essential (primary) hypertension: Secondary | ICD-10-CM | POA: Insufficient documentation

## 2011-05-03 ENCOUNTER — Other Ambulatory Visit: Payer: Self-pay | Admitting: Oncology

## 2011-05-03 ENCOUNTER — Encounter (HOSPITAL_BASED_OUTPATIENT_CLINIC_OR_DEPARTMENT_OTHER): Payer: Medicare Other | Admitting: Oncology

## 2011-05-03 DIAGNOSIS — D649 Anemia, unspecified: Secondary | ICD-10-CM

## 2011-05-03 DIAGNOSIS — I471 Supraventricular tachycardia: Secondary | ICD-10-CM

## 2011-05-03 DIAGNOSIS — I2699 Other pulmonary embolism without acute cor pulmonale: Secondary | ICD-10-CM

## 2011-05-03 DIAGNOSIS — I8390 Asymptomatic varicose veins of unspecified lower extremity: Secondary | ICD-10-CM

## 2011-05-03 LAB — PROTIME-INR
INR: 2.4 (ref 2.00–3.50)
Protime: 28.8 Seconds — ABNORMAL HIGH (ref 10.6–13.4)

## 2011-05-06 ENCOUNTER — Emergency Department (HOSPITAL_COMMUNITY)
Admission: EM | Admit: 2011-05-06 | Discharge: 2011-05-06 | Disposition: A | Discharge status: Home / Self Care | Payer: Medicare Other | Attending: Emergency Medicine | Admitting: Emergency Medicine

## 2011-05-06 DIAGNOSIS — M549 Dorsalgia, unspecified: Secondary | ICD-10-CM | POA: Insufficient documentation

## 2011-05-06 DIAGNOSIS — Z7901 Long term (current) use of anticoagulants: Secondary | ICD-10-CM | POA: Insufficient documentation

## 2011-05-06 DIAGNOSIS — Z86718 Personal history of other venous thrombosis and embolism: Secondary | ICD-10-CM | POA: Insufficient documentation

## 2011-05-06 DIAGNOSIS — I10 Essential (primary) hypertension: Secondary | ICD-10-CM | POA: Insufficient documentation

## 2011-05-17 ENCOUNTER — Emergency Department (HOSPITAL_COMMUNITY)
Admission: EM | Admit: 2011-05-17 | Discharge: 2011-05-17 | Disposition: A | Discharge status: Home / Self Care | Payer: Medicare Other | Attending: Emergency Medicine | Admitting: Emergency Medicine

## 2011-05-17 DIAGNOSIS — M545 Low back pain, unspecified: Secondary | ICD-10-CM | POA: Insufficient documentation

## 2011-05-17 DIAGNOSIS — Z7901 Long term (current) use of anticoagulants: Secondary | ICD-10-CM | POA: Insufficient documentation

## 2011-05-17 DIAGNOSIS — Z86711 Personal history of pulmonary embolism: Secondary | ICD-10-CM | POA: Insufficient documentation

## 2011-05-17 DIAGNOSIS — Z79899 Other long term (current) drug therapy: Secondary | ICD-10-CM | POA: Insufficient documentation

## 2011-05-17 DIAGNOSIS — F341 Dysthymic disorder: Secondary | ICD-10-CM | POA: Insufficient documentation

## 2011-05-17 DIAGNOSIS — M129 Arthropathy, unspecified: Secondary | ICD-10-CM | POA: Insufficient documentation

## 2011-05-17 DIAGNOSIS — G8929 Other chronic pain: Secondary | ICD-10-CM | POA: Insufficient documentation

## 2011-05-17 DIAGNOSIS — I10 Essential (primary) hypertension: Secondary | ICD-10-CM | POA: Insufficient documentation

## 2011-05-31 ENCOUNTER — Other Ambulatory Visit: Payer: Self-pay | Admitting: Oncology

## 2011-05-31 ENCOUNTER — Encounter (HOSPITAL_BASED_OUTPATIENT_CLINIC_OR_DEPARTMENT_OTHER): Payer: Medicare Other | Admitting: Oncology

## 2011-05-31 DIAGNOSIS — D649 Anemia, unspecified: Secondary | ICD-10-CM

## 2011-05-31 DIAGNOSIS — I8390 Asymptomatic varicose veins of unspecified lower extremity: Secondary | ICD-10-CM

## 2011-05-31 DIAGNOSIS — I2699 Other pulmonary embolism without acute cor pulmonale: Secondary | ICD-10-CM

## 2011-05-31 DIAGNOSIS — I471 Supraventricular tachycardia: Secondary | ICD-10-CM

## 2011-05-31 DIAGNOSIS — Z7901 Long term (current) use of anticoagulants: Secondary | ICD-10-CM

## 2011-05-31 LAB — PROTIME-INR
INR: 3.4 (ref 2.00–3.50)
Protime: 40.8 Seconds — ABNORMAL HIGH (ref 10.6–13.4)

## 2011-06-19 LAB — CBC
HCT: 31.4 — ABNORMAL LOW
Hemoglobin: 10.5 — ABNORMAL LOW
MCHC: 33.5
MCV: 98.3
Platelets: 308
RBC: 3.19 — ABNORMAL LOW
RDW: 14
WBC: 7

## 2011-06-19 LAB — BASIC METABOLIC PANEL WITH GFR
BUN: 3 — ABNORMAL LOW
Chloride: 100
Creatinine, Ser: 0.72
Glucose, Bld: 108 — ABNORMAL HIGH

## 2011-06-19 LAB — URINALYSIS, ROUTINE W REFLEX MICROSCOPIC
Bilirubin Urine: NEGATIVE
Glucose, UA: NEGATIVE
Hgb urine dipstick: NEGATIVE
Ketones, ur: NEGATIVE
Nitrite: NEGATIVE
Protein, ur: NEGATIVE
Specific Gravity, Urine: 1.008
Urobilinogen, UA: 0.2
pH: 6.5

## 2011-06-19 LAB — BASIC METABOLIC PANEL
CO2: 31
Calcium: 9.8
GFR calc Af Amer: >60
GFR calc non Af Amer: >60
Potassium: 3.5
Sodium: 138

## 2011-06-19 LAB — DIFFERENTIAL
Basophils Absolute: 0.2 — ABNORMAL HIGH
Basophils Relative: 3 — ABNORMAL HIGH
Eosinophils Absolute: 0
Eosinophils Relative: 1
Lymphocytes Relative: 20
Lymphs Abs: 1.4
Monocytes Absolute: 0.4
Monocytes Relative: 6
Neutro Abs: 5
Neutrophils Relative %: 71

## 2011-06-19 LAB — POCT CARDIAC MARKERS
CKMB, poc: 1.1
Myoglobin, poc: 47.2
Operator id: 4661
Troponin i, poc: <0.05

## 2011-06-19 LAB — URINE MICROSCOPIC-ADD ON

## 2011-06-20 ENCOUNTER — Other Ambulatory Visit: Payer: Self-pay | Admitting: Interventional Radiology

## 2011-06-20 DIAGNOSIS — L97329 Non-pressure chronic ulcer of left ankle with unspecified severity: Secondary | ICD-10-CM

## 2011-06-20 DIAGNOSIS — I83223 Varicose veins of left lower extremity with both ulcer of ankle and inflammation: Secondary | ICD-10-CM

## 2011-06-24 ENCOUNTER — Emergency Department (HOSPITAL_COMMUNITY)
Admission: EM | Admit: 2011-06-24 | Discharge: 2011-06-24 | Disposition: A | Discharge status: Home / Self Care | Payer: Medicare Other | Attending: Emergency Medicine | Admitting: Emergency Medicine

## 2011-06-24 DIAGNOSIS — F3289 Other specified depressive episodes: Secondary | ICD-10-CM | POA: Insufficient documentation

## 2011-06-24 DIAGNOSIS — Z7901 Long term (current) use of anticoagulants: Secondary | ICD-10-CM | POA: Insufficient documentation

## 2011-06-24 DIAGNOSIS — Z86718 Personal history of other venous thrombosis and embolism: Secondary | ICD-10-CM | POA: Insufficient documentation

## 2011-06-24 DIAGNOSIS — I10 Essential (primary) hypertension: Secondary | ICD-10-CM | POA: Insufficient documentation

## 2011-06-24 DIAGNOSIS — M545 Low back pain, unspecified: Secondary | ICD-10-CM | POA: Insufficient documentation

## 2011-06-24 DIAGNOSIS — G8929 Other chronic pain: Secondary | ICD-10-CM | POA: Insufficient documentation

## 2011-06-24 DIAGNOSIS — Z79899 Other long term (current) drug therapy: Secondary | ICD-10-CM | POA: Insufficient documentation

## 2011-06-24 DIAGNOSIS — M129 Arthropathy, unspecified: Secondary | ICD-10-CM | POA: Insufficient documentation

## 2011-06-24 DIAGNOSIS — F329 Major depressive disorder, single episode, unspecified: Secondary | ICD-10-CM | POA: Insufficient documentation

## 2011-06-24 LAB — PROTIME-INR: INR: 2.18 — ABNORMAL HIGH (ref 0.00–1.49)

## 2011-06-26 ENCOUNTER — Encounter (HOSPITAL_BASED_OUTPATIENT_CLINIC_OR_DEPARTMENT_OTHER): Payer: Medicare Other | Admitting: Oncology

## 2011-06-26 ENCOUNTER — Other Ambulatory Visit: Payer: Self-pay | Admitting: Oncology

## 2011-06-26 DIAGNOSIS — I8390 Asymptomatic varicose veins of unspecified lower extremity: Secondary | ICD-10-CM

## 2011-06-26 DIAGNOSIS — Z7901 Long term (current) use of anticoagulants: Secondary | ICD-10-CM

## 2011-06-26 DIAGNOSIS — I471 Supraventricular tachycardia: Secondary | ICD-10-CM

## 2011-06-26 DIAGNOSIS — I2699 Other pulmonary embolism without acute cor pulmonale: Secondary | ICD-10-CM

## 2011-06-26 LAB — CBC & DIFF AND RETIC
Basophils Absolute: 0 10*3/uL (ref 0.0–0.1)
Eosinophils Absolute: 0.2 10*3/uL (ref 0.0–0.5)
HGB: 11.7 g/dL (ref 11.6–15.9)
LYMPH%: 20.9 % (ref 14.0–49.7)
MCV: 96.4 fL (ref 79.5–101.0)
MONO%: 6.4 % (ref 0.0–14.0)
NEUT#: 4.4 10*3/uL (ref 1.5–6.5)
Platelets: 220 10*3/uL (ref 145–400)

## 2011-06-26 LAB — PROTIME-INR: Protime: 37.2 Seconds — ABNORMAL HIGH (ref 10.6–13.4)

## 2011-06-27 LAB — BASIC METABOLIC PANEL
Calcium: 9.4
GFR calc Af Amer: >60
GFR calc non Af Amer: 56 — ABNORMAL LOW
Potassium: 3.4 — ABNORMAL LOW
Sodium: 134 — ABNORMAL LOW

## 2011-06-28 LAB — IMMUNOFIXATION ELECTROPHORESIS
IgA: 381 mg/dL — ABNORMAL HIGH (ref 69–380)
IgG (Immunoglobin G), Serum: 1590 mg/dL (ref 690–1700)

## 2011-06-28 LAB — FERRITIN: Ferritin: 55 ng/mL (ref 10–291)

## 2011-07-04 ENCOUNTER — Other Ambulatory Visit: Payer: Medicare Other

## 2011-07-05 LAB — COMPREHENSIVE METABOLIC PANEL
Albumin: 4
Alkaline Phosphatase: 75
BUN: 15
CO2: 30
Chloride: 103
GFR calc non Af Amer: 58 — ABNORMAL LOW
Glucose, Bld: 102 — ABNORMAL HIGH
Potassium: 4.8
Total Bilirubin: 0.4

## 2011-07-05 LAB — LIPID PANEL
LDL Cholesterol: 134 — ABNORMAL HIGH
VLDL: 17

## 2011-07-05 LAB — CBC
HCT: 33.6 — ABNORMAL LOW
Hemoglobin: 11.5 — ABNORMAL LOW
Platelets: 251
RBC: 3.58 — ABNORMAL LOW
WBC: 5.8

## 2011-07-06 LAB — POCT CARDIAC MARKERS
CKMB, poc: <1 — ABNORMAL LOW
Troponin i, poc: <0.05

## 2011-07-06 LAB — I-STAT 8, (EC8 V) (CONVERTED LAB)
Acid-Base Excess: 5 — ABNORMAL HIGH
Bicarbonate: 30.4 — ABNORMAL HIGH
HCT: 39
Operator id: 294521
TCO2: 32
pCO2, Ven: 48.7

## 2011-07-06 LAB — CBC
Hemoglobin: 11.6 — ABNORMAL LOW
MCHC: 33.9
RBC: 3.62 — ABNORMAL LOW

## 2011-07-06 LAB — DIFFERENTIAL
Basophils Relative: 1
Lymphocytes Relative: 22
Monocytes Absolute: 0.3
Monocytes Relative: 4
Neutro Abs: 5.1

## 2011-07-06 LAB — POCT I-STAT CREATININE: Creatinine, Ser: 1.1

## 2011-07-09 ENCOUNTER — Emergency Department (HOSPITAL_COMMUNITY)
Admission: EM | Admit: 2011-07-09 | Discharge: 2011-07-09 | Disposition: A | Discharge status: Home / Self Care | Payer: Medicare Other | Attending: Emergency Medicine | Admitting: Emergency Medicine

## 2011-07-09 DIAGNOSIS — I10 Essential (primary) hypertension: Secondary | ICD-10-CM | POA: Insufficient documentation

## 2011-07-09 DIAGNOSIS — F341 Dysthymic disorder: Secondary | ICD-10-CM | POA: Insufficient documentation

## 2011-07-09 DIAGNOSIS — M129 Arthropathy, unspecified: Secondary | ICD-10-CM | POA: Insufficient documentation

## 2011-07-09 DIAGNOSIS — M549 Dorsalgia, unspecified: Secondary | ICD-10-CM | POA: Insufficient documentation

## 2011-07-09 DIAGNOSIS — Z79899 Other long term (current) drug therapy: Secondary | ICD-10-CM | POA: Insufficient documentation

## 2011-07-09 DIAGNOSIS — Z7901 Long term (current) use of anticoagulants: Secondary | ICD-10-CM | POA: Insufficient documentation

## 2011-07-09 DIAGNOSIS — Z86718 Personal history of other venous thrombosis and embolism: Secondary | ICD-10-CM | POA: Insufficient documentation

## 2011-07-09 DIAGNOSIS — I8390 Asymptomatic varicose veins of unspecified lower extremity: Secondary | ICD-10-CM | POA: Insufficient documentation

## 2011-07-09 DIAGNOSIS — G8929 Other chronic pain: Secondary | ICD-10-CM | POA: Insufficient documentation

## 2011-07-09 DIAGNOSIS — M79609 Pain in unspecified limb: Secondary | ICD-10-CM | POA: Insufficient documentation

## 2011-07-09 LAB — POCT I-STAT CREATININE
Creatinine, Ser: 0.8
Operator id: 189501
Operator id: 277751

## 2011-07-09 LAB — I-STAT 8, (EC8 V) (CONVERTED LAB)
Acid-Base Excess: 4 — ABNORMAL HIGH
Acid-Base Excess: 5 — ABNORMAL HIGH
Chloride: 105
Glucose, Bld: 90
Hemoglobin: 12.9
Potassium: 3.6
Sodium: 140
TCO2: 33
pCO2, Ven: 44 — ABNORMAL LOW
pH, Ven: 7.432 — ABNORMAL HIGH

## 2011-07-11 ENCOUNTER — Emergency Department (HOSPITAL_COMMUNITY): Payer: Medicare Other

## 2011-07-11 ENCOUNTER — Emergency Department (HOSPITAL_COMMUNITY)
Admission: EM | Admit: 2011-07-11 | Discharge: 2011-07-11 | Disposition: A | Discharge status: Home / Self Care | Payer: Medicare Other | Attending: Emergency Medicine | Admitting: Emergency Medicine

## 2011-07-11 DIAGNOSIS — M545 Low back pain, unspecified: Secondary | ICD-10-CM | POA: Insufficient documentation

## 2011-07-11 DIAGNOSIS — I10 Essential (primary) hypertension: Secondary | ICD-10-CM | POA: Insufficient documentation

## 2011-07-11 DIAGNOSIS — M79609 Pain in unspecified limb: Secondary | ICD-10-CM | POA: Insufficient documentation

## 2011-07-11 DIAGNOSIS — G8929 Other chronic pain: Secondary | ICD-10-CM | POA: Insufficient documentation

## 2011-07-19 ENCOUNTER — Ambulatory Visit: Payer: Medicare Other | Admitting: Gastroenterology

## 2011-07-24 ENCOUNTER — Inpatient Hospital Stay: Admission: RE | Admit: 2011-07-24 | Payer: Medicare Other | Source: Ambulatory Visit

## 2011-07-24 ENCOUNTER — Other Ambulatory Visit: Payer: Medicare Other

## 2011-07-30 ENCOUNTER — Emergency Department (HOSPITAL_COMMUNITY): Payer: Medicare Other

## 2011-07-30 ENCOUNTER — Ambulatory Visit: Payer: Medicare Other

## 2011-07-30 ENCOUNTER — Other Ambulatory Visit: Payer: Medicare Other | Admitting: Lab

## 2011-07-30 ENCOUNTER — Encounter (HOSPITAL_COMMUNITY): Payer: Self-pay | Admitting: *Deleted

## 2011-07-30 ENCOUNTER — Emergency Department (HOSPITAL_COMMUNITY)
Admission: EM | Admit: 2011-07-30 | Discharge: 2011-07-30 | Disposition: A | Discharge status: Home / Self Care | Payer: Medicare Other | Attending: Emergency Medicine | Admitting: Emergency Medicine

## 2011-07-30 DIAGNOSIS — M549 Dorsalgia, unspecified: Secondary | ICD-10-CM | POA: Insufficient documentation

## 2011-07-30 DIAGNOSIS — Z981 Arthrodesis status: Secondary | ICD-10-CM | POA: Insufficient documentation

## 2011-07-30 DIAGNOSIS — Z7901 Long term (current) use of anticoagulants: Secondary | ICD-10-CM | POA: Insufficient documentation

## 2011-07-30 DIAGNOSIS — M545 Low back pain, unspecified: Secondary | ICD-10-CM | POA: Insufficient documentation

## 2011-07-30 DIAGNOSIS — D649 Anemia, unspecified: Secondary | ICD-10-CM | POA: Insufficient documentation

## 2011-07-30 DIAGNOSIS — E785 Hyperlipidemia, unspecified: Secondary | ICD-10-CM | POA: Insufficient documentation

## 2011-07-30 DIAGNOSIS — I10 Essential (primary) hypertension: Secondary | ICD-10-CM | POA: Insufficient documentation

## 2011-07-30 DIAGNOSIS — Z79899 Other long term (current) drug therapy: Secondary | ICD-10-CM | POA: Insufficient documentation

## 2011-07-30 LAB — URINALYSIS, ROUTINE W REFLEX MICROSCOPIC
Bilirubin Urine: NEGATIVE
Ketones, ur: NEGATIVE mg/dL
Nitrite: NEGATIVE
Specific Gravity, Urine: 1.015 (ref 1.005–1.030)
Urobilinogen, UA: 0.2 mg/dL (ref 0.0–1.0)

## 2011-07-30 LAB — CBC
HCT: 36.5 % (ref 36.0–46.0)
MCHC: 34.2 g/dL (ref 30.0–36.0)
Platelets: 256 10*3/uL (ref 150–400)
RDW: 13.5 % (ref 11.5–15.5)

## 2011-07-30 LAB — BASIC METABOLIC PANEL
BUN: 11 mg/dL (ref 6–23)
GFR calc Af Amer: >90 mL/min (ref >90)
GFR calc non Af Amer: 84 mL/min — ABNORMAL LOW (ref >90)
Potassium: 3.8 mEq/L (ref 3.5–5.1)

## 2011-07-30 MED ORDER — FENTANYL CITRATE 0.05 MG/ML IJ SOLN
50.0000 ug | Freq: Once | INTRAMUSCULAR | Status: AC
  Administered 2011-07-30: 50 ug via INTRAVENOUS
  Filled 2011-07-30: qty 2

## 2011-07-30 MED ORDER — TRAMADOL HCL 50 MG PO TABS
50.0000 mg | ORAL_TABLET | Freq: Once | ORAL | Status: AC
  Administered 2011-07-30: 50 mg via ORAL
  Filled 2011-07-30: qty 1

## 2011-07-30 NOTE — ED Notes (Signed)
Pt to ED for eval of lower back pain. Pt reports chronic back pain that she is being followed up with by PCP and ortho. Pt reports she has lower back pain, points to my coccyx area that radiates down bilateral legs. Pt reports right hip and right knee surgery and pain is greater on the right hip, reports hard to sleep. Pt reports frequent falls because of discomfort and poor mobility. Pt uses cane for help with ambulation. Pt reports pain and tingling to bilateral legs and feet but denies decreased sensation. Moves all extremities.

## 2011-07-30 NOTE — ED Notes (Signed)
Pt provided pillow. 

## 2011-07-30 NOTE — ED Provider Notes (Signed)
History     CSN: [id number] Arrival date & time: 07/30/2011  1:34 PM   First MD Initiated Contact with Patient 07/30/11 1503      Chief Complaint  Patient presents with  . Back Pain    Pt c/o lower back pain x's 3 years, can feel pain in toes and feet.     (Consider location/radiation/quality/duration/timing/severity/associated sxs/prior treatment) HPI Comments: Pt presents c/o 5 years of chronic low back pain s/p fusion surgery.  She has no new injuries or fevers. No urinary symptoms.  Pt states that she has f/u with her spine surgeon and he has told her that there is nothing wrong with her back.  She has also f/u with her PCP who is going to refer her to a back specialist.  Pt also reports that she sees a pain specialist as well. Pt states that she has come in today due to increasing pain and it radiates down her thighs and her right knee where she had her replacement done hurts.  Pt states that she doesn't want to be on percocet because she thinks that it's too strong.  No weakness, numbness, or bladder or bowel incontinence issues.    Back Pain  This is a chronic problem. The current episode started more than 1 week ago (5 years ago). The problem occurs constantly. The problem has been gradually worsening. The pain is associated with no known injury. The pain is present in the lumbar spine. The quality of the pain is described as aching. The pain radiates to the left thigh and right thigh. The pain is moderate. The symptoms are aggravated by bending, twisting and certain positions. Pertinent negatives include no chest pain, no fever, no numbness, no weight loss, no headaches, no abdominal pain, no abdominal swelling, no bowel incontinence, no perianal numbness, no bladder incontinence, no dysuria, no pelvic pain, no paresthesias, no paresis, no tingling and no weakness. She has tried nothing for the symptoms.    Past Medical History  Diagnosis Date  . Depression   . Schizophrenia   .  Arthritis   . Hyperlipidemia   . Hypertension   . Chronic pain   . Pulmonary embolism   . DVT (deep venous thrombosis)     per 01/17/10 d/c summary- "remote hx of dvt"?  . Iron deficiency anemia   . Chronic headaches   . Sleep apnea   . Glaucoma   . Hemorrhoids   . Asthma   . Anxiety   . GERD (gastroesophageal reflux disease)   . Anxiety     Past Surgical History  Procedure Date  . Spinal fusion     2004  . Joint replacement right knee 11/2008  . Tubal ligation year 21  . Carpal tunnel release     bilateral     Family History  Problem Relation Age of Onset  . Diabetes Sister   . Colon cancer Brother 40    History  Substance Use Topics  . Smoking status: Never Smoker   . Smokeless tobacco: Never Used  . Alcohol Use: No    OB History    Grav Para Term Preterm Abortions TAB SAB Ect Mult Living                  Review of Systems  Constitutional: Negative.  Negative for fever, chills and weight loss.  HENT: Negative.   Eyes: Negative.  Negative for discharge and redness.  Respiratory: Negative.  Negative for cough and shortness of breath.  Cardiovascular: Negative.  Negative for chest pain.  Gastrointestinal: Negative.  Negative for nausea, vomiting, abdominal pain, diarrhea and bowel incontinence.  Genitourinary: Negative.  Negative for bladder incontinence, dysuria, vaginal discharge and pelvic pain.  Musculoskeletal: Positive for back pain.  Skin: Negative.  Negative for color change and rash.  Neurological: Negative.  Negative for tingling, syncope, weakness, numbness, headaches and paresthesias.  Hematological: Negative.  Negative for adenopathy.  Psychiatric/Behavioral: Negative.  Negative for confusion.  All other systems reviewed and are negative.    Allergies  Review of patient's allergies indicates no known allergies.  Home Medications   Current Outpatient Rx  Name Route Sig Dispense Refill  . ALBUTEROL SULFATE HFA 108 (90 BASE) MCG/ACT IN  AERS Inhalation Inhale 2 puffs into the lungs every 4 (four) hours as needed. For shortness of breath or wheezing.     . ATENOLOL 100 MG PO TABS Oral Take 100 mg by mouth daily.      Marland Kitchen CLONAZEPAM 0.5 MG PO TABS Oral Take 0.5 mg by mouth 2 (two) times daily.     Marland Kitchen ESOMEPRAZOLE MAGNESIUM 40 MG PO CPDR Oral Take 40 mg by mouth daily. For reflux    . FERROUS SULFATE 325 (65 FE) MG PO TABS Oral Take 1 tablet (325 mg total) by mouth 2 (two) times daily with a meal. 60 tablet 3  . FLUOXETINE HCL 20 MG PO TABS  3 tabs by mouth every morning     . FLUTICASONE PROPIONATE  HFA 220 MCG/ACT IN AERO Inhalation Inhale 1 puff into the lungs 2 (two) times daily as needed. Cough/shortness of breath    . MECLIZINE HCL 25 MG PO TABS Oral Take 25 mg by mouth every 8 (eight) hours as needed. For dizziness     . OXYCODONE-ACETAMINOPHEN 5-325 MG PO TABS Oral Take 1 tablet by mouth Every 4 hours as needed.    Marland Kitchen POTASSIUM CHLORIDE CRYS CR 20 MEQ PO TBCR Oral Take 20 mEq by mouth daily.      . TRAMADOL HCL 50 MG PO TABS Oral Take 1 tablet (50 mg total) by mouth every 8 (eight) hours as needed for pain. 60 tablet 0  . TRIAMCINOLONE ACETONIDE 0.1 % EX CREA Topical Apply 1 application topically Three times a day.    Marland Kitchen VITAMIN D (ERGOCALCIFEROL) 50000 UNITS PO CAPS Oral Take 50,000 Units by mouth every 7 (seven) days. Saturdays    . WARFARIN SODIUM 5 MG PO TABS Oral Take 5 mg by mouth daily.     Marland Kitchen HYDROCHLOROTHIAZIDE 25 MG PO TABS Oral Take 25 mg by mouth daily. For high blood pressure       BP 137/77  Pulse 64  Temp(Src) 98.7 F (37.1 C) (Oral)  Resp 18  SpO2 97%  Physical Exam  Constitutional: She is oriented to person, place, and time. She appears well-developed and well-nourished.  Non-toxic appearance. She does not have a sickly appearance.  HENT:  Head: Normocephalic and atraumatic.  Eyes: Conjunctivae, EOM and lids are normal. Pupils are equal, round, and reactive to light. No scleral icterus.  Neck: Trachea  normal and normal range of motion. Neck supple.  Cardiovascular: Normal rate, regular rhythm and normal heart sounds.   Pulmonary/Chest: Effort normal and breath sounds normal.  Abdominal: Soft. Normal appearance. There is no tenderness. There is no rebound, no guarding and no CVA tenderness.  Genitourinary:       External rectal exam shows no hemorrhoids or other skin breakdown.    Musculoskeletal:  Normal range of motion.       No focal l-spine pain on exam.  No redness, rash.  No weakness or numbness in her legs. Palpable DP pulses bilaterally.  Pt can lift both legs off the bed.    Neurological: She is alert and oriented to person, place, and time. She has normal strength.  Skin: Skin is warm, dry and intact. No rash noted.  Psychiatric: She has a normal mood and affect. Her behavior is normal. Judgment and thought content normal.    ED Course  Procedures (including critical care time)   Labs Reviewed  CBC  BASIC METABOLIC PANEL  URINALYSIS, ROUTINE W REFLEX MICROSCOPIC   No results found.   No diagnosis found.    MDM  Patient has some mild worsening of her chronic pain.  Patient readily does admit that this pain has been going on for 5 years.  She is no acute signs of infection.  There are no fevers, no focal tenderness on exam.  She has no nausea or vomiting.  No UTI.  No elevated white blood cell count on her laboratory studies.  No acute findings on her x-rays either.  Patient's pain is improved with her pain medications in the emergency department.  She is followup with her primary care physician and prefers to let him prescribed all her medications and so we will not discharge her with any new medicines today.        Nat Christen, MD 07/30/11 [phone]

## 2011-08-03 ENCOUNTER — Other Ambulatory Visit: Payer: Self-pay | Admitting: Oncology

## 2011-08-03 ENCOUNTER — Ambulatory Visit: Payer: Medicare Other

## 2011-08-03 ENCOUNTER — Other Ambulatory Visit (HOSPITAL_BASED_OUTPATIENT_CLINIC_OR_DEPARTMENT_OTHER): Payer: Medicare Other | Admitting: Lab

## 2011-08-03 ENCOUNTER — Ambulatory Visit: Payer: Self-pay | Admitting: Pharmacist

## 2011-08-03 DIAGNOSIS — Z7901 Long term (current) use of anticoagulants: Secondary | ICD-10-CM

## 2011-08-03 DIAGNOSIS — I8390 Asymptomatic varicose veins of unspecified lower extremity: Secondary | ICD-10-CM

## 2011-08-03 DIAGNOSIS — D6851 Activated protein C resistance: Secondary | ICD-10-CM

## 2011-08-03 DIAGNOSIS — I2699 Other pulmonary embolism without acute cor pulmonale: Secondary | ICD-10-CM

## 2011-08-03 DIAGNOSIS — Z5181 Encounter for therapeutic drug level monitoring: Secondary | ICD-10-CM

## 2011-08-03 DIAGNOSIS — I471 Supraventricular tachycardia: Secondary | ICD-10-CM

## 2011-08-23 ENCOUNTER — Other Ambulatory Visit: Payer: Self-pay | Admitting: Gastroenterology

## 2011-08-31 ENCOUNTER — Other Ambulatory Visit: Payer: Self-pay | Admitting: Pharmacist

## 2011-08-31 DIAGNOSIS — I2699 Other pulmonary embolism without acute cor pulmonale: Secondary | ICD-10-CM

## 2011-09-02 ENCOUNTER — Emergency Department (HOSPITAL_COMMUNITY)
Admission: EM | Admit: 2011-09-02 | Discharge: 2011-09-02 | Disposition: A | Discharge status: Home / Self Care | Payer: Medicare Other | Attending: Emergency Medicine | Admitting: Emergency Medicine

## 2011-09-02 ENCOUNTER — Encounter (HOSPITAL_COMMUNITY): Payer: Self-pay

## 2011-09-02 DIAGNOSIS — Z7901 Long term (current) use of anticoagulants: Secondary | ICD-10-CM | POA: Insufficient documentation

## 2011-09-02 DIAGNOSIS — I10 Essential (primary) hypertension: Secondary | ICD-10-CM | POA: Insufficient documentation

## 2011-09-02 DIAGNOSIS — IMO0001 Reserved for inherently not codable concepts without codable children: Secondary | ICD-10-CM | POA: Insufficient documentation

## 2011-09-02 DIAGNOSIS — R059 Cough, unspecified: Secondary | ICD-10-CM | POA: Insufficient documentation

## 2011-09-02 DIAGNOSIS — M791 Myalgia, unspecified site: Secondary | ICD-10-CM

## 2011-09-02 DIAGNOSIS — R05 Cough: Secondary | ICD-10-CM | POA: Insufficient documentation

## 2011-09-02 DIAGNOSIS — R5381 Other malaise: Secondary | ICD-10-CM | POA: Insufficient documentation

## 2011-09-02 DIAGNOSIS — Z86718 Personal history of other venous thrombosis and embolism: Secondary | ICD-10-CM | POA: Insufficient documentation

## 2011-09-02 DIAGNOSIS — M79609 Pain in unspecified limb: Secondary | ICD-10-CM | POA: Insufficient documentation

## 2011-09-02 LAB — POCT I-STAT, CHEM 8
BUN: 10 mg/dL (ref 6–23)
Calcium, Ion: 1.14 mmol/L (ref 1.12–1.32)
Chloride: 97 meq/L (ref 96–112)
Creatinine, Ser: 0.9 mg/dL (ref 0.50–1.10)
Glucose, Bld: 108 mg/dL — ABNORMAL HIGH (ref 70–99)
HCT: 38 % (ref 36.0–46.0)
Hemoglobin: 12.9 g/dL (ref 12.0–15.0)
Potassium: 3.6 mEq/L (ref 3.5–5.1)
Sodium: 138 mEq/L (ref 135–145)
TCO2: 31 mmol/L (ref 0–100)

## 2011-09-02 MED ORDER — TRAMADOL HCL 50 MG PO TABS
50.0000 mg | ORAL_TABLET | Freq: Once | ORAL | Status: AC
  Administered 2011-09-02: 50 mg via ORAL
  Filled 2011-09-02: qty 1

## 2011-09-02 MED ORDER — TRAMADOL HCL 50 MG PO TABS: 50.0000 mg | ORAL_TABLET | Freq: Four times a day (QID) | ORAL | Status: AC | PRN

## 2011-09-02 NOTE — ED Provider Notes (Signed)
History     CSN: [id number] Arrival date & time: 09/02/2011 10:09 AM   First MD Initiated Contact with Patient 09/02/11 1025      Chief Complaint  Patient presents with  . Influenza    (Consider location/radiation/quality/duration/timing/severity/associated sxs/prior treatment) HPI Comments: Patient with multiple medical problems presents with chief complaint of muscle aches, particular in her upper extremities for the past one week. She states this is associated with some subjective weakness. She also states that she's had a cough that is chronic. Patient was recently called in tramadol and a cough syrup by her primary care doctor however she cannot pick this up until tomorrow. The patient denies fever, upper respiratory symptoms, chest pain, vomiting. The patient was recently started on simvastatin for high cholesterol. Patient denies changes in her urine.  Patient is a 70 y.o. female presenting with extremity pain. The history is provided by the patient.  Extremity Pain This is a new problem. The current episode started in the past 7 days. The problem occurs constantly. The problem has been unchanged. Associated symptoms include coughing, myalgias and weakness. Pertinent negatives include no abdominal pain, arthralgias, chest pain, chills, fever, headaches, joint swelling, nausea, neck pain, numbness, rash, sore throat or vomiting. The symptoms are aggravated by exertion. She has tried nothing for the symptoms.    Past Medical History  Diagnosis Date  . Depression   . Schizophrenia   . Arthritis   . Hyperlipidemia   . Hypertension   . Chronic pain   . Pulmonary embolism   . DVT (deep venous thrombosis)     per 01/17/10 d/c summary- "remote hx of dvt"?  . Iron deficiency anemia   . Chronic headaches   . Sleep apnea   . Glaucoma   . Hemorrhoids   . Asthma   . Anxiety   . GERD (gastroesophageal reflux disease)   . Anxiety     Past Surgical History  Procedure Date  . Spinal  fusion     2004  . Joint replacement right knee 11/2008  . Tubal ligation year 89  . Carpal tunnel release     bilateral     Family History  Problem Relation Age of Onset  . Diabetes Sister   . Colon cancer Brother 40    History  Substance Use Topics  . Smoking status: Never Smoker   . Smokeless tobacco: Never Used  . Alcohol Use: No    OB History    Grav Para Term Preterm Abortions TAB SAB Ect Mult Living                  Review of Systems  Constitutional: Negative for fever and chills.  HENT: Negative for sore throat, rhinorrhea and neck pain.   Eyes: Negative for discharge.  Respiratory: Positive for cough. Negative for shortness of breath.   Cardiovascular: Negative for chest pain.  Gastrointestinal: Negative for nausea, vomiting, abdominal pain, diarrhea and constipation.  Genitourinary: Negative for dysuria and hematuria.  Musculoskeletal: Positive for myalgias. Negative for back pain, joint swelling and arthralgias.  Skin: Negative for rash.  Neurological: Positive for weakness. Negative for numbness and headaches.  Psychiatric/Behavioral: Negative for confusion.    Allergies  Review of patient's allergies indicates no known allergies.  Home Medications   Current Outpatient Rx  Name Route Sig Dispense Refill  . ALBUTEROL SULFATE HFA 108 (90 BASE) MCG/ACT IN AERS Inhalation Inhale 2 puffs into the lungs every 4 (four) hours as needed. For shortness of breath  or wheezing.     . ATENOLOL 100 MG PO TABS Oral Take 100 mg by mouth daily.      Marland Kitchen CLONAZEPAM 0.5 MG PO TABS Oral Take 0.5 mg by mouth 2 (two) times daily.     Marland Kitchen ESOMEPRAZOLE MAGNESIUM 40 MG PO CPDR Oral Take 40 mg by mouth daily. For reflux    . EZETIMIBE 10 MG PO TABS Oral Take 10 mg by mouth daily.      Marland Kitchen FERROUS SULFATE 325 (65 FE) MG PO TABS  TAKE 1 TABLET BY MOUTH TWICE DAILY WITH A MEAL 60 tablet 0  . FLUOXETINE HCL 20 MG PO TABS  3 tabs by mouth every morning     . FLUTICASONE PROPIONATE  HFA  220 MCG/ACT IN AERO Inhalation Inhale 1 puff into the lungs 2 (two) times daily as needed. Cough/shortness of breath    . GABAPENTIN 100 MG PO CAPS Oral Take 100 mg by mouth at bedtime.      Marland Kitchen HYDROCHLOROTHIAZIDE 25 MG PO TABS Oral Take 25 mg by mouth daily. For high blood pressure     . MECLIZINE HCL 25 MG PO TABS Oral Take 25 mg by mouth every 8 (eight) hours as needed. For dizziness     . OXYCODONE-ACETAMINOPHEN 5-325 MG PO TABS Oral Take 1 tablet by mouth Every 4 hours as needed.    Marland Kitchen POTASSIUM CHLORIDE CRYS CR 20 MEQ PO TBCR Oral Take 20 mEq by mouth daily.      Marland Kitchen SIMVASTATIN 20 MG PO TABS Oral Take 20 mg by mouth at bedtime.      . TRAMADOL HCL 50 MG PO TABS Oral Take 1 tablet (50 mg total) by mouth every 8 (eight) hours as needed for pain. 60 tablet 0  . TRIAMCINOLONE ACETONIDE 0.1 % EX CREA Topical Apply 1 application topically Three times a day.    Marland Kitchen VITAMIN D (ERGOCALCIFEROL) 50000 UNITS PO CAPS Oral Take 50,000 Units by mouth every 7 (seven) days. Saturdays    . WARFARIN SODIUM 5 MG PO TABS Oral Take 5 mg by mouth daily.       BP 117/54  Pulse 69  Temp(Src) 99.3 F (37.4 C) (Oral)  Resp 18  SpO2 96%  Physical Exam  Nursing note and vitals reviewed. Constitutional: She is oriented to person, place, and time. She appears well-developed and well-nourished.  HENT:  Head: Normocephalic and atraumatic.  Right Ear: Tympanic membrane and external ear normal.  Left Ear: Tympanic membrane and external ear normal.  Nose: Nose normal.  Mouth/Throat: Oropharynx is clear and moist and mucous membranes are normal.  Eyes: Pupils are equal, round, and reactive to light. Right eye exhibits no discharge. Left eye exhibits no discharge.  Neck: Normal range of motion. Neck supple.  Cardiovascular: Normal rate and regular rhythm.  Exam reveals no gallop and no friction rub.   No murmur heard. Pulmonary/Chest: Effort normal and breath sounds normal. No respiratory distress. She has no wheezes.    Abdominal: Soft. There is no tenderness. There is no rebound and no guarding.  Musculoskeletal: Normal range of motion. She exhibits no edema and no tenderness.  Neurological: She is alert and oriented to person, place, and time.  Skin: Skin is warm and dry. No rash noted.  Psychiatric: She has a normal mood and affect.    ED Course  Procedures (including critical care time)  Labs Reviewed  CK - Abnormal; Notable for the following:    Total CK 455 (*)  All other components within normal limits  POCT I-STAT, CHEM 8 - Abnormal; Notable for the following:    Glucose, Bld 108 (*)    All other components within normal limits  I-STAT, CHEM 8   No results found.   1. Muscle ache     11:21 AM patient seen and examined. Terminal ordered. Will check kidney function and CK given muscle aches in the setting of recent initiation of simvastatin. These are normal will discharge to home with primary care followup.  12:54 PM patient informed of results. Patient told to stop taking the simvastatin until she follows up with her primary care doctor. I have given her a prescription for tramadol that she can fill tonight until she can get her regular prescription filled tomorrow. Patient verbalized understanding and agrees with plan.  MDM  Patient with diffuse muscle aches without other findings of systemic illness. CK and renal function checked and are not appreciably changed, however I will have the patient hold the simvastatin until she can followup with her primary care physician. Do not suspect any reaction from the flu shot she had last week.       Carolee Rota, [place] 09/02/11 1256

## 2011-09-02 NOTE — ED Notes (Signed)
Pt in with c/o flu like sx states ongoing for 1 week states was given flu shot pt c/o body aches

## 2011-09-03 NOTE — ED Provider Notes (Signed)
Evaluation and management procedures were performed by the mid-level provider (PA/NP/CNM) under my supervision/collaboration. I was present and available during the ED course. Trenell Moxey Y.   Evangelia Whitaker Y. Betzaira Mentel, MD 09/03/11 1148 

## 2011-09-04 ENCOUNTER — Ambulatory Visit: Payer: Medicare Other

## 2011-09-04 ENCOUNTER — Other Ambulatory Visit: Payer: Medicare Other | Admitting: Lab

## 2011-09-06 ENCOUNTER — Other Ambulatory Visit: Payer: Medicare Other

## 2011-09-06 ENCOUNTER — Ambulatory Visit: Payer: Medicare Other

## 2011-09-06 ENCOUNTER — Telehealth: Payer: Self-pay | Admitting: Pharmacist

## 2011-09-06 NOTE — Telephone Encounter (Signed)
Patient called pharmacy. She stated that she couldn't make it to her lab and Coumadin Clinic appointment today. She was having pain all over her body. She didn't think it was due to her fibromyalgia.  Lab/Coumadin Clinic appointment rescheduled to 09/13/11. She wanted to talk to Dr. Cyndie Chime about setting up an appointment to see him. She trusts him. She also wanted him to know about the pain she is having. I gave pt the Dr. Patsy Lager RN phone number to help with setting up a return office visit.

## 2011-09-13 ENCOUNTER — Other Ambulatory Visit (HOSPITAL_BASED_OUTPATIENT_CLINIC_OR_DEPARTMENT_OTHER): Payer: Medicare Other

## 2011-09-13 ENCOUNTER — Ambulatory Visit: Payer: Self-pay | Admitting: Oncology

## 2011-09-13 ENCOUNTER — Ambulatory Visit: Payer: Medicare Other

## 2011-09-13 DIAGNOSIS — D6851 Activated protein C resistance: Secondary | ICD-10-CM

## 2011-09-13 DIAGNOSIS — Z7901 Long term (current) use of anticoagulants: Secondary | ICD-10-CM

## 2011-09-13 DIAGNOSIS — I2699 Other pulmonary embolism without acute cor pulmonale: Secondary | ICD-10-CM

## 2011-09-13 LAB — PROTIME-INR: INR: 1.9 — ABNORMAL LOW (ref 2.00–3.50)

## 2011-09-17 ENCOUNTER — Encounter (HOSPITAL_COMMUNITY): Payer: Self-pay | Admitting: *Deleted

## 2011-09-17 ENCOUNTER — Emergency Department (HOSPITAL_COMMUNITY)
Admission: EM | Admit: 2011-09-17 | Discharge: 2011-09-18 | Disposition: A | Discharge status: Home / Self Care | Payer: Medicare Other | Attending: Emergency Medicine | Admitting: Emergency Medicine

## 2011-09-17 DIAGNOSIS — IMO0001 Reserved for inherently not codable concepts without codable children: Secondary | ICD-10-CM | POA: Insufficient documentation

## 2011-09-17 DIAGNOSIS — J45909 Unspecified asthma, uncomplicated: Secondary | ICD-10-CM | POA: Insufficient documentation

## 2011-09-17 DIAGNOSIS — K219 Gastro-esophageal reflux disease without esophagitis: Secondary | ICD-10-CM | POA: Insufficient documentation

## 2011-09-17 DIAGNOSIS — F341 Dysthymic disorder: Secondary | ICD-10-CM | POA: Insufficient documentation

## 2011-09-17 DIAGNOSIS — M129 Arthropathy, unspecified: Secondary | ICD-10-CM | POA: Insufficient documentation

## 2011-09-17 DIAGNOSIS — I10 Essential (primary) hypertension: Secondary | ICD-10-CM | POA: Insufficient documentation

## 2011-09-17 DIAGNOSIS — E785 Hyperlipidemia, unspecified: Secondary | ICD-10-CM | POA: Insufficient documentation

## 2011-09-17 DIAGNOSIS — H409 Unspecified glaucoma: Secondary | ICD-10-CM | POA: Insufficient documentation

## 2011-09-17 DIAGNOSIS — G8929 Other chronic pain: Secondary | ICD-10-CM | POA: Insufficient documentation

## 2011-09-17 DIAGNOSIS — Z8659 Personal history of other mental and behavioral disorders: Secondary | ICD-10-CM | POA: Insufficient documentation

## 2011-09-17 DIAGNOSIS — Z86718 Personal history of other venous thrombosis and embolism: Secondary | ICD-10-CM | POA: Insufficient documentation

## 2011-09-17 DIAGNOSIS — Z79899 Other long term (current) drug therapy: Secondary | ICD-10-CM | POA: Insufficient documentation

## 2011-09-17 MED ORDER — TRAMADOL HCL 50 MG PO TABS: 50.0000 mg | ORAL_TABLET | Freq: Four times a day (QID) | ORAL | Status: AC | PRN

## 2011-09-17 MED ORDER — HYDROCODONE-ACETAMINOPHEN 5-325 MG PO TABS
1.0000 | ORAL_TABLET | Freq: Once | ORAL | Status: DC
  Filled 2011-09-17: qty 1

## 2011-09-17 MED ORDER — TRAMADOL HCL 50 MG PO TABS
50.0000 mg | ORAL_TABLET | Freq: Once | ORAL | Status: AC
  Administered 2011-09-17: 50 mg via ORAL
  Filled 2011-09-17: qty 1

## 2011-09-17 NOTE — ED Notes (Signed)
Pt c/o entire body pain. Pt has hx of fibromyalgia. Pt states this is her normal pain from that. Pt ambulatory steady gait noted.

## 2011-09-17 NOTE — ED Notes (Signed)
Patient here with c/o pain all over due to her fibromyalgia

## 2011-09-17 NOTE — ED Provider Notes (Signed)
History     CSN: [id number]  Arrival date & time 09/17/11  2214   Chief Complaint  Patient presents with  . Fibromyalgia   HPI Pt was seen at 2310.  Per pt, c/o gradual onset and persistence of constant acute flair of her chronic fibromyalgia "pain all over" for the past several days.  States her pain began after she ran out of her tramadol.  Denies any change in her usual chronic longstanding pain pattern.  Pt has been eval in the ED multiple times previously for similar chronic pain complaints. Denies any specific area of pain.  Denies CP/SOB, no abd pain, no N/V/D, no fevers, no rash.     Past Medical History  Diagnosis Date  . Depression   . Schizophrenia   . Arthritis   . Hyperlipidemia   . Hypertension   . Chronic pain   . Pulmonary embolism   . DVT (deep venous thrombosis)     per 01/17/10 d/c summary- "remote hx of dvt"?  . Iron deficiency anemia   . Chronic headaches   . Sleep apnea   . Glaucoma   . Hemorrhoids   . Asthma   . Anxiety   . GERD (gastroesophageal reflux disease)   . Anxiety     Past Surgical History  Procedure Date  . Spinal fusion     2004  . Joint replacement right knee 11/2008  . Tubal ligation year 32  . Carpal tunnel release     bilateral     Family History  Problem Relation Age of Onset  . Diabetes Sister   . Colon cancer Brother 40    History  Substance Use Topics  . Smoking status: Never Smoker   . Smokeless tobacco: Never Used  . Alcohol Use: No   Review of Systems ROS: Statement: All systems negative except as marked or noted in the HPI; Constitutional: Negative for fever and chills. ; ; Eyes: Negative for eye pain, redness and discharge. ; ; ENMT: Negative for ear pain, hoarseness, nasal congestion, sinus pressure and sore throat. ; ; Cardiovascular: Negative for chest pain, palpitations, diaphoresis, dyspnea and peripheral edema. ; ; Respiratory: Negative for cough, wheezing and stridor. ; ; Gastrointestinal: Negative for  nausea, vomiting, diarrhea, abdominal pain, blood in stool, hematemesis, jaundice and rectal bleeding. . ; ; Genitourinary: Negative for dysuria, flank pain and hematuria. ; ; Musculoskeletal: Negative for back pain and neck pain. Negative for swelling and trauma.; ; Skin: Negative for pruritus, rash, abrasions, blisters, bruising and skin lesion.; ; Neuro: Negative for headache, lightheadedness and neck stiffness. Negative for weakness, altered level of consciousness , altered mental status, extremity weakness, paresthesias, involuntary movement, seizure and syncope.     Allergies  Review of patient's allergies indicates no known allergies.  Home Medications   Current Outpatient Rx  Name Route Sig Dispense Refill  . ALBUTEROL SULFATE HFA 108 (90 BASE) MCG/ACT IN AERS Inhalation Inhale 2 puffs into the lungs every 4 (four) hours as needed. For shortness of breath or wheezing.     . ATENOLOL 100 MG PO TABS Oral Take 100 mg by mouth daily.      Marland Kitchen CLONAZEPAM 0.5 MG PO TABS Oral Take 0.5 mg by mouth 2 (two) times daily.     Marland Kitchen ESOMEPRAZOLE MAGNESIUM 40 MG PO CPDR Oral Take 40 mg by mouth daily. For reflux    . EZETIMIBE 10 MG PO TABS Oral Take 10 mg by mouth daily.      Marland Kitchen  FERROUS SULFATE 325 (65 FE) MG PO TABS  TAKE 1 TABLET BY MOUTH TWICE DAILY WITH A MEAL 60 tablet 0  . FLUOXETINE HCL 20 MG PO TABS Oral Take 20 mg by mouth at bedtime and may repeat dose one time if needed. 3 tabs by mouth every morning    . FLUTICASONE PROPIONATE  HFA 220 MCG/ACT IN AERO Inhalation Inhale 1 puff into the lungs 2 (two) times daily as needed. Cough/shortness of breath    . GABAPENTIN 100 MG PO CAPS Oral Take 100 mg by mouth at bedtime.      Marland Kitchen HYDROCHLOROTHIAZIDE 25 MG PO TABS Oral Take 25 mg by mouth daily. For high blood pressure     . MECLIZINE HCL 25 MG PO TABS Oral Take 25 mg by mouth every 8 (eight) hours as needed. For dizziness     . OXYCODONE-ACETAMINOPHEN 5-325 MG PO TABS Oral Take 1 tablet by mouth Every 4  hours as needed. For pain    . POTASSIUM CHLORIDE CRYS CR 20 MEQ PO TBCR Oral Take 20 mEq by mouth daily.      . TRAMADOL HCL 50 MG PO TABS Oral Take 1 tablet (50 mg total) by mouth every 8 (eight) hours as needed for pain. 60 tablet 0  . TRIAMCINOLONE ACETONIDE 0.1 % EX CREA Topical Apply 1 application topically Three times a day.    Marland Kitchen VITAMIN D (ERGOCALCIFEROL) 50000 UNITS PO CAPS Oral Take 50,000 Units by mouth every 7 (seven) days. Saturdays    . WARFARIN SODIUM 5 MG PO TABS Oral Take 5 mg by mouth daily.       BP 117/72  Pulse 73  Temp(Src) 98.5 F (36.9 C) (Oral)  Resp 16  Ht 5\' 5"  (1.651 m)  Wt 280 lb (127.007 kg)  BMI 46.59 kg/m2  SpO2 97%  Physical Exam 2315: Physical examination:  Nursing notes reviewed; Vital signs and O2 SAT reviewed;  Constitutional: Well developed, Well nourished, Well hydrated, In no acute distress; Head:  Normocephalic, atraumatic; Eyes: EOMI, PERRL, No scleral icterus; ENMT: Mouth and pharynx normal, Mucous membranes moist; Neck: Supple, Full range of motion, No lymphadenopathy; Cardiovascular: Regular rate and rhythm, No murmur or gallop; Respiratory: Breath sounds clear & equal bilaterally, No rales, rhonchi, wheezes, or rub, Normal respiratory effort/excursion; Chest: Nontender, Movement normal; Abdomen: Soft, Nontender, Nondistended, Normal bowel sounds; Genitourinary: No CVA tenderness; Extremities: Pulses normal, No tenderness, No edema, No calf edema or asymmetry.; Neuro: AA&Ox3, Major CN grossly intact. Speech clear, no facial droop.  No gross focal motor or sensory deficits in extremities.; Skin: Color normal, Warm, Dry, no rash.    ED Course  Procedures   MDM  MDM Reviewed: previous chart, nursing note and vitals   [place] Controlled Substance Data Base accessed: pt with 4 rx percocet by 3 different providers in the last 3 months; last oxycodone filled 08/22/11 #30 rx by Dr. Concepcion Elk.  Will not rx narcotics today.  Pt has long hx of chronic pain.   Will tx tramadol and encourage pt to f/u with her PMD regarding her chronic narcotic pain medication needs.  Pt verb understanding.    Darrien Laakso Allison Quarry, DO 09/19/11 1902

## 2011-10-07 ENCOUNTER — Encounter (HOSPITAL_COMMUNITY): Payer: Self-pay | Admitting: Internal Medicine

## 2011-10-07 ENCOUNTER — Inpatient Hospital Stay (HOSPITAL_COMMUNITY)
Admission: AD | Admit: 2011-10-07 | Discharge: 2011-10-11 | DRG: 812 | Disposition: A | Discharge status: Home Health | Payer: Medicare Other | Attending: Internal Medicine | Admitting: Internal Medicine

## 2011-10-07 DIAGNOSIS — I959 Hypotension, unspecified: Secondary | ICD-10-CM

## 2011-10-07 DIAGNOSIS — J45909 Unspecified asthma, uncomplicated: Secondary | ICD-10-CM | POA: Diagnosis present

## 2011-10-07 DIAGNOSIS — D62 Acute posthemorrhagic anemia: Principal | ICD-10-CM | POA: Diagnosis present

## 2011-10-07 DIAGNOSIS — E669 Obesity, unspecified: Secondary | ICD-10-CM

## 2011-10-07 DIAGNOSIS — D6851 Activated protein C resistance: Secondary | ICD-10-CM

## 2011-10-07 DIAGNOSIS — I83813 Varicose veins of bilateral lower extremities with pain: Secondary | ICD-10-CM | POA: Diagnosis present

## 2011-10-07 DIAGNOSIS — I10 Essential (primary) hypertension: Secondary | ICD-10-CM

## 2011-10-07 DIAGNOSIS — R911 Solitary pulmonary nodule: Secondary | ICD-10-CM

## 2011-10-07 DIAGNOSIS — I8390 Asymptomatic varicose veins of unspecified lower extremity: Secondary | ICD-10-CM | POA: Diagnosis present

## 2011-10-07 DIAGNOSIS — F329 Major depressive disorder, single episode, unspecified: Secondary | ICD-10-CM

## 2011-10-07 DIAGNOSIS — D649 Anemia, unspecified: Secondary | ICD-10-CM

## 2011-10-07 DIAGNOSIS — F209 Schizophrenia, unspecified: Secondary | ICD-10-CM | POA: Diagnosis present

## 2011-10-07 DIAGNOSIS — I2699 Other pulmonary embolism without acute cor pulmonale: Secondary | ICD-10-CM

## 2011-10-07 DIAGNOSIS — K219 Gastro-esophageal reflux disease without esophagitis: Secondary | ICD-10-CM

## 2011-10-07 DIAGNOSIS — L299 Pruritus, unspecified: Secondary | ICD-10-CM

## 2011-10-07 DIAGNOSIS — Z86711 Personal history of pulmonary embolism: Secondary | ICD-10-CM | POA: Diagnosis present

## 2011-10-07 DIAGNOSIS — E785 Hyperlipidemia, unspecified: Secondary | ICD-10-CM | POA: Diagnosis present

## 2011-10-07 DIAGNOSIS — H409 Unspecified glaucoma: Secondary | ICD-10-CM | POA: Diagnosis present

## 2011-10-07 DIAGNOSIS — G8929 Other chronic pain: Secondary | ICD-10-CM

## 2011-10-07 DIAGNOSIS — Z7901 Long term (current) use of anticoagulants: Secondary | ICD-10-CM

## 2011-10-07 DIAGNOSIS — D509 Iron deficiency anemia, unspecified: Secondary | ICD-10-CM

## 2011-10-07 DIAGNOSIS — R58 Hemorrhage, not elsewhere classified: Secondary | ICD-10-CM

## 2011-10-07 DIAGNOSIS — Z7189 Other specified counseling: Secondary | ICD-10-CM

## 2011-10-07 DIAGNOSIS — Z86718 Personal history of other venous thrombosis and embolism: Secondary | ICD-10-CM

## 2011-10-07 DIAGNOSIS — Z800 Family history of malignant neoplasm of digestive organs: Secondary | ICD-10-CM

## 2011-10-07 DIAGNOSIS — R748 Abnormal levels of other serum enzymes: Secondary | ICD-10-CM

## 2011-10-07 LAB — BASIC METABOLIC PANEL
CO2: 26 mEq/L (ref 19–32)
Calcium: 8.6 mg/dL (ref 8.4–10.5)
Calcium: 8.2 mg/dL — ABNORMAL LOW (ref 8.4–10.5)
Creatinine, Ser: 0.98 mg/dL (ref 0.50–1.10)
GFR calc Af Amer: 55 mL/min — ABNORMAL LOW (ref >90)
GFR calc non Af Amer: 47 mL/min — ABNORMAL LOW (ref >90)
Glucose, Bld: 121 mg/dL — ABNORMAL HIGH (ref 70–99)
Glucose, Bld: 104 mg/dL — ABNORMAL HIGH (ref 70–99)
Potassium: 3.2 mEq/L — ABNORMAL LOW (ref 3.5–5.1)
Sodium: 136 mEq/L (ref 135–145)

## 2011-10-07 LAB — CBC
HCT: 28.4 % — ABNORMAL LOW (ref 36.0–46.0)
Hemoglobin: 9 g/dL — ABNORMAL LOW (ref 12.0–15.0)
Hemoglobin: 9.6 g/dL — ABNORMAL LOW (ref 12.0–15.0)
Hemoglobin: 8 g/dL — ABNORMAL LOW (ref 12.0–15.0)
MCH: 32.2 pg (ref 26.0–34.0)
MCH: 32.3 pg (ref 26.0–34.0)
MCH: 32.9 pg (ref 26.0–34.0)
MCV: 96.8 fL (ref 78.0–100.0)
MCV: 95.9 fL (ref 78.0–100.0)
Platelets: 216 10*3/uL (ref 150–400)
RBC: 2.43 MIL/uL — ABNORMAL LOW (ref 3.87–5.11)
RBC: 2.79 MIL/uL — ABNORMAL LOW (ref 3.87–5.11)
RBC: 2.93 MIL/uL — ABNORMAL LOW (ref 3.87–5.11)
RBC: 3.17 MIL/uL — ABNORMAL LOW (ref 3.87–5.11)
RDW: 13.5 % (ref 11.5–15.5)
WBC: 10.1 10*3/uL (ref 4.0–10.5)
WBC: 10.3 10*3/uL (ref 4.0–10.5)
WBC: 7.8 10*3/uL (ref 4.0–10.5)

## 2011-10-07 LAB — PROTIME-INR
INR: 2.76 — ABNORMAL HIGH (ref 0.00–1.49)
Prothrombin Time: 29.1 seconds — ABNORMAL HIGH (ref 11.6–15.2)

## 2011-10-07 LAB — TYPE AND SCREEN
ABO/RH(D): O POS
Antibody Screen: NEGATIVE

## 2011-10-07 LAB — LACTIC ACID, PLASMA: Lactic Acid, Venous: 2.3 mmol/L — ABNORMAL HIGH (ref 0.5–2.2)

## 2011-10-07 MED ORDER — TRAMADOL HCL 50 MG PO TABS
50.0000 mg | ORAL_TABLET | Freq: Two times a day (BID) | ORAL | Status: DC | PRN
  Administered 2011-10-08: 50 mg via ORAL
  Filled 2011-10-07: qty 1

## 2011-10-07 MED ORDER — FLUOXETINE HCL 20 MG PO CAPS
60.0000 mg | ORAL_CAPSULE | Freq: Every day | ORAL | Status: DC
  Administered 2011-10-07 – 2011-10-11 (×5): 60 mg via ORAL
  Filled 2011-10-07 (×5): qty 3

## 2011-10-07 MED ORDER — PANTOPRAZOLE SODIUM 40 MG PO TBEC
80.0000 mg | DELAYED_RELEASE_TABLET | Freq: Every day | ORAL | Status: DC
  Administered 2011-10-07 – 2011-10-11 (×5): 80 mg via ORAL
  Filled 2011-10-07 (×2): qty 1
  Filled 2011-10-07 (×3): qty 2

## 2011-10-07 MED ORDER — VITAMIN D (ERGOCALCIFEROL) 1.25 MG (50000 UNIT) PO CAPS
50000.0000 [IU] | ORAL_CAPSULE | ORAL | Status: DC
  Administered 2011-10-07: 50000 [IU] via ORAL
  Filled 2011-10-07 (×2): qty 1

## 2011-10-07 MED ORDER — EZETIMIBE 10 MG PO TABS
10.0000 mg | ORAL_TABLET | Freq: Every day | ORAL | Status: DC
  Administered 2011-10-07 – 2011-10-11 (×5): 10 mg via ORAL
  Filled 2011-10-07 (×5): qty 1

## 2011-10-07 MED ORDER — ALUM & MAG HYDROXIDE-SIMETH 200-200-20 MG/5ML PO SUSP
30.0000 mL | ORAL | Status: DC | PRN
  Administered 2011-10-09: 30 mL via ORAL
  Filled 2011-10-07: qty 30

## 2011-10-07 MED ORDER — SODIUM CHLORIDE 0.9 % IV BOLUS (SEPSIS)
500.0000 mL | Freq: Once | INTRAVENOUS | Status: AC
  Administered 2011-10-07: 1000 mL via INTRAVENOUS

## 2011-10-07 MED ORDER — SODIUM CHLORIDE 0.9 % IV SOLN
INTRAVENOUS | Status: DC
  Administered 2011-10-07 – 2011-10-09 (×3): via INTRAVENOUS

## 2011-10-07 MED ORDER — ACETAMINOPHEN 325 MG PO TABS: 650.0000 mg | ORAL_TABLET | Freq: Four times a day (QID) | ORAL | Status: DC | PRN

## 2011-10-07 MED ORDER — CLONAZEPAM 0.5 MG PO TABS
0.5000 mg | ORAL_TABLET | Freq: Two times a day (BID) | ORAL | Status: DC
  Administered 2011-10-07 – 2011-10-11 (×9): 0.5 mg via ORAL
  Filled 2011-10-07 (×9): qty 1

## 2011-10-07 MED ORDER — SODIUM CHLORIDE 0.9 % IV SOLN
INTRAVENOUS | Status: DC
  Administered 2011-10-07: 11:00:00 via INTRAVENOUS

## 2011-10-07 MED ORDER — ONDANSETRON HCL 4 MG/2ML IJ SOLN: 4.0000 mg | Freq: Four times a day (QID) | INTRAMUSCULAR | Status: DC | PRN

## 2011-10-07 MED ORDER — GABAPENTIN 100 MG PO CAPS
100.0000 mg | ORAL_CAPSULE | Freq: Once | ORAL | Status: AC
  Administered 2011-10-07: 100 mg via ORAL
  Filled 2011-10-07 (×2): qty 1

## 2011-10-07 MED ORDER — FLUOXETINE HCL 20 MG PO CAPS: 60.0000 mg | ORAL_CAPSULE | Freq: Every day | ORAL | Status: DC

## 2011-10-07 MED ORDER — ACETAMINOPHEN 650 MG RE SUPP: 650.0000 mg | Freq: Four times a day (QID) | RECTAL | Status: DC | PRN

## 2011-10-07 MED ORDER — WARFARIN SODIUM 5 MG PO TABS
5.0000 mg | ORAL_TABLET | Freq: Every day | ORAL | Status: DC
  Administered 2011-10-07 – 2011-10-10 (×4): 5 mg via ORAL
  Filled 2011-10-07 (×5): qty 1

## 2011-10-07 MED ORDER — OXYCODONE HCL 5 MG PO TABS: 5.0000 mg | ORAL_TABLET | ORAL | Status: DC | PRN

## 2011-10-07 MED ORDER — MECLIZINE HCL 25 MG PO TABS
25.0000 mg | ORAL_TABLET | Freq: Three times a day (TID) | ORAL | Status: DC | PRN
  Filled 2011-10-07: qty 1

## 2011-10-07 MED ORDER — FLUOXETINE HCL 20 MG PO TABS: 20.0000 mg | ORAL_TABLET | Freq: Every evening | ORAL | Status: DC | PRN

## 2011-10-07 MED ORDER — GABAPENTIN 100 MG PO CAPS
100.0000 mg | ORAL_CAPSULE | Freq: Every day | ORAL | Status: DC
  Administered 2011-10-07 – 2011-10-09 (×3): 100 mg via ORAL
  Filled 2011-10-07 (×4): qty 1

## 2011-10-07 MED ORDER — OXYCODONE HCL 5 MG PO TABS
5.0000 mg | ORAL_TABLET | ORAL | Status: DC | PRN
  Administered 2011-10-07: 5 mg via ORAL
  Administered 2011-10-08 – 2011-10-09 (×4): 10 mg via ORAL
  Filled 2011-10-07 (×2): qty 2
  Filled 2011-10-07: qty 1
  Filled 2011-10-07 (×2): qty 2
  Filled 2011-10-07: qty 1
  Filled 2011-10-07: qty 2

## 2011-10-07 MED ORDER — OXYCODONE-ACETAMINOPHEN 5-325 MG PO TABS: 1.0000 | ORAL_TABLET | ORAL | Status: DC | PRN

## 2011-10-07 MED ORDER — FLUTICASONE PROPIONATE HFA 220 MCG/ACT IN AERO
1.0000 | INHALATION_SPRAY | Freq: Two times a day (BID) | RESPIRATORY_TRACT | Status: DC
  Administered 2011-10-07 – 2011-10-11 (×9): 1 via RESPIRATORY_TRACT
  Filled 2011-10-07: qty 12

## 2011-10-07 MED ORDER — ONDANSETRON HCL 4 MG PO TABS: 4.0000 mg | ORAL_TABLET | Freq: Four times a day (QID) | ORAL | Status: DC | PRN

## 2011-10-07 NOTE — ED Provider Notes (Signed)
History     CSN: [id number]  Arrival date & time 10/07/11  0021   First MD Initiated Contact with Patient 10/07/11 0032      Chief Complaint  Patient presents with  . 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 700cc or so based on blood on floor and towels at bedside - the spouse tried to apply pressure unsuccessfully, EMS did pressure dressing with ice pack and elevation and had complete hemostasis by arrival.  Sx were severe, persistent but resolved after said intervention.  She denies new meds or change in dose or OD on coumadin.  She has no light headed, SOB or other c/o -though spouse stated to EMS that she was confused during bleeding.  The history is provided by the patient and the EMS personnel.    Past Medical History  Diagnosis Date  . Depression   . Schizophrenia   . Arthritis   . Hyperlipidemia   . Hypertension   . Chronic pain   . Pulmonary embolism   . DVT (deep venous thrombosis)     per 01/17/10 d/c summary- "remote hx of dvt"?  . Iron deficiency anemia   . Chronic headaches   . Sleep apnea   . Glaucoma   . Hemorrhoids   . Asthma   . Anxiety   . GERD (gastroesophageal reflux disease)   . Anxiety     Past Surgical History  Procedure Date  . Spinal fusion     2004  . Joint replacement right knee 11/2008  . Tubal ligation year 83  . Carpal tunnel release     bilateral     Family History  Problem Relation Age of Onset  . Diabetes Sister   . Colon cancer Brother 40    History  Substance Use Topics  . Smoking status: Never Smoker   . Smokeless tobacco: Never Used  . Alcohol Use: No    OB History    Grav Para Term Preterm Abortions  TAB SAB Ect Mult Living                  Review of Systems  All other systems reviewed and are negative.    Allergies  Review of patient's allergies indicates no known allergies.  Home Medications   Current Outpatient Rx  Name Route Sig Dispense Refill  . ALBUTEROL SULFATE HFA 108 (90 BASE) MCG/ACT IN AERS Inhalation Inhale 2 puffs into the lungs every 4 (four) hours as needed. For shortness of breath or wheezing.     . ATENOLOL 100 MG PO TABS Oral Take 100 mg by mouth daily.      Marland Kitchen CLONAZEPAM 0.5 MG PO TABS Oral Take 0.5 mg by mouth 2 (two) times daily.     Marland Kitchen ESOMEPRAZOLE MAGNESIUM 40 MG PO CPDR Oral Take 40 mg by mouth daily. For reflux    . EZETIMIBE 10 MG PO TABS Oral Take 10 mg by mouth daily.      Marland Kitchen FERROUS SULFATE 325 (65 FE) MG PO TABS Oral Take 325 mg by mouth 2 (two) times daily with a meal.    . FLUOXETINE HCL 20 MG PO TABS Oral Take 20 mg by mouth at bedtime and may repeat dose one time if  needed. 3 tabs by mouth every morning    . FLUTICASONE PROPIONATE  HFA 220 MCG/ACT IN AERO Inhalation Inhale 1 puff into the lungs 2 (two) times daily as needed. Cough/shortness of breath    . GABAPENTIN 100 MG PO CAPS Oral Take 100 mg by mouth at bedtime.      Marland Kitchen HYDROCHLOROTHIAZIDE 25 MG PO TABS Oral Take 25 mg by mouth daily. For high blood pressure     . MECLIZINE HCL 25 MG PO TABS Oral Take 25 mg by mouth every 8 (eight) hours as needed. For dizziness     . OXYCODONE-ACETAMINOPHEN 5-325 MG PO TABS Oral Take 1 tablet by mouth Every 4 hours as needed. For pain    . POTASSIUM CHLORIDE CRYS ER 20 MEQ PO TBCR Oral Take 20 mEq by mouth daily.      Marland Kitchen PRAMIPEXOLE DIHYDROCHLORIDE 0.25 MG PO TABS Oral Take 0.25 mg by mouth at bedtime.    . TRAMADOL HCL 50 MG PO TABS Oral Take 50 mg by mouth 2 (two) times daily as needed. For pain    . TRIAMCINOLONE ACETONIDE 0.1 % EX CREA Topical Apply 1 application topically Three times a day.    Marland Kitchen VITAMIN D (ERGOCALCIFEROL) 50000 UNITS PO CAPS Oral Take  50,000 Units by mouth every 7 (seven) days. Saturdays    . WARFARIN SODIUM 5 MG PO TABS Oral Take 5 mg by mouth daily.      BP 93/55  Pulse 59  Temp 97.8 F (36.6 C)  Resp 12  SpO2 100%  Physical Exam  Nursing note and vitals reviewed. Constitutional: She appears well-developed and well-nourished. No distress.  HENT:  Head: Normocephalic and atraumatic.  Mouth/Throat: Oropharynx is clear and moist. No oropharyngeal exudate.  Eyes: Conjunctivae and EOM are normal. Pupils are equal, round, and reactive to light. Right eye exhibits no discharge. Left eye exhibits no discharge. No scleral icterus.  Neck: Normal range of motion. Neck supple. No JVD present. No thyromegaly present.  Cardiovascular: Normal rate, regular rhythm, normal heart sounds and intact distal pulses.  Exam reveals no gallop and no friction rub.   No murmur heard.      Normal pulses are radial pedal pulses.  Brisk CRT bilaterally.  Pulmonary/Chest: Effort normal and breath sounds normal. No respiratory distress. She has no wheezes. She has no rales.  Abdominal: Soft. Bowel sounds are normal. She exhibits no distension and no mass. There is no tenderness.  Musculoskeletal: Normal range of motion. She exhibits edema ( scant bil LE edema). She exhibits no tenderness.  Lymphadenopathy:    She has no cervical adenopathy.  Neurological: She is alert. Coordination normal.  Skin: Skin is warm and dry. No rash noted. No erythema.       Varicose vein to the anterior medial mid LE's with a scab but no active bleeding.  Psychiatric: She has a normal mood and affect. Her behavior is normal.    ED Course  Procedures (including critical care time)  Labs Reviewed  CBC - Abnormal; Notable for the following:    RBC 3.17 (*)    Hemoglobin 10.2 (*)    HCT 30.4 (*)    All other components within normal limits  PROTIME-INR - Abnormal; Notable for the following:    Prothrombin Time 29.1 (*)    INR 2.70 (*)    All other components  within normal limits  APTT - Abnormal; Notable for the following:    aPTT 40 (*)    All  other components within normal limits  LACTIC ACID, PLASMA  BASIC METABOLIC PANEL   No results found.   1. Bleeding   2. Hypotension       MDM  Check coag panel - has had hemostasis prior to arrival, VS initially reported as hypotensive in the field, on arrival BP of 98 systolic, has improved more with fluids.   Blood pressure continues to be between 80 and 95 systolic. There is no tachycardic response at this time. The patient is slow to arouse but when awake has normal mental status. IV fluids have been given to support blood pressure, currently hemoglobin level is at 10.2, which is low compared to prior measurements of 12.5 one month ago. Will discuss with hospitalist for admission   Discussed care with the hospitalist Dr. Kaylyn Layer who has agreed for admission to the step down unit. Consider transfusion, 2 L of fluids given with current blood pressure of 96 systolic.  Critical care provided for hypotension in the face of acute hemorrhagic bleeding.  CRITICAL CARE Performed by: Vida Roller   Total critical care time: 35  Critical care time was exclusive of separately billable procedures and treating other patients.  Critical care was necessary to treat or prevent imminent or life-threatening deterioration.  Critical care was time spent personally by me on the following activities: development of treatment plan with patient and/or surrogate as well as nursing, discussions with consultants, evaluation of patient's response to treatment, examination of patient, obtaining history from patient or surrogate, ordering and performing treatments and interventions, ordering and review of laboratory studies, ordering and review of radiographic studies, pulse oximetry and re-evaluation of patient's condition.   Vida Roller, MD 10/07/11 [phone]

## 2011-10-07 NOTE — H&P (Signed)
PCP:   Dorrene German, MD, MD   Chief Complaint:  Bleeding from LE venous varicosity  HPI: 70yoF with h/o asthma, DVT/large central bilateral PE's in 12/2009 on Coumadin  presents with acute hemorrhagic anemia from supervicial venous varicosity bleed.   Pt reports that she was taking a shower around midnight Saturday after feeding her  son (who has seizure d/o and takes PEG tube feeds). She noted some bleeding from  her right leg venous varicosities while in the shower and ended up calling EMS and  it was quite profuse. Per records, EMS estimated the blood loss to be around 750  cc's, which pt endorses as well, that it was "a lot." She was also quite dizzy  which lasted through the ambulance ride, but did not have LOC, chest pain, SOB. She  states she has had bleeding from her varicosities maybe 7-8 times in the past few  years.   In the ED, vitals significant for some hypoTN to the 80-90's, no tachycardia. Noted  to be 85% on RA, improved on [place]. Labs showed normal chem panel except HCO3 31. CK  455. WBC 10.3, Hct 30.4. INR 2.7. On arrival the SDU, her BP's are now 110's with  IVF's only.   ROS as above, o/w negative. She was in her usual state of health without complaint  before this acutely happened.  Past Medical History  Diagnosis Date  . Depression   . Schizophrenia   . Arthritis   . Hyperlipidemia   . Hypertension   . Chronic pain   . 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 DVT (deep venous thrombosis)     per 01/17/10 d/c summary- "remote hx of dvt"?  . Iron deficiency anemia   . Chronic headaches   . Sleep apnea   . Glaucoma   . Hemorrhoids   . Asthma   . Anxiety   . GERD (gastroesophageal reflux disease)   . Anxiety     Past Surgical History  Procedure Date  . Spinal fusion     2004  . Joint replacement right knee 11/2008  . Tubal ligation year 31  . Carpal tunnel release     bilateral    Medications:  HOME MEDS: Tried to  reconcile with patient myself but not very successful. However, pharm tech notes Indicate that this was well done before admission Prior to Admission medications   Medication Sig Start Date End Date Taking? Authorizing Provider  albuterol (VENTOLIN HFA) 108 (90 BASE) MCG/ACT inhaler Inhale 2 puffs into the lungs every 4 (four) hours as needed. For shortness of breath or wheezing.    Yes Historical Provider, MD  atenolol (TENORMIN) 100 MG tablet Take 100 mg by mouth daily.     Yes Historical Provider, MD  clonazePAM (KLONOPIN) 0.5 MG tablet Take 0.5 mg by mouth 2 (two) times daily.    Yes Historical Provider, MD  esomeprazole (NEXIUM) 40 MG capsule Take 40 mg by mouth daily. For reflux   Yes Historical Provider, MD  ezetimibe (ZETIA) 10 MG tablet Take 10 mg by mouth daily.     Yes Historical Provider, MD  ferrous sulfate 325 (65 FE) MG tablet Take 325 mg by mouth 2 (two) times daily with a meal.   Yes Historical Provider, MD  FLUoxetine (PROZAC) 20 MG tablet Take 20 mg by mouth at bedtime and may repeat dose one time if needed. 3 tabs by mouth every morning   Yes Historical Provider,  MD  fluticasone (FLOVENT HFA) 220 MCG/ACT inhaler Inhale 1 puff into the lungs 2 (two) times daily as needed. Cough/shortness of breath   Yes Historical Provider, MD  gabapentin (NEURONTIN) 100 MG capsule Take 100 mg by mouth at bedtime.     Yes Historical Provider, MD  hydrochlorothiazide 25 MG tablet Take 25 mg by mouth daily. For high blood pressure    Yes Historical Provider, MD  meclizine (ANTIVERT) 25 MG tablet Take 25 mg by mouth every 8 (eight) hours as needed. For dizziness    Yes Historical Provider, MD  oxyCODONE-acetaminophen (PERCOCET) 5-325 MG per tablet Take 1 tablet by mouth Every 4 hours as needed. For pain 07/24/11  Yes Historical Provider, MD  potassium chloride SA (K-DUR,KLOR-CON) 20 MEQ tablet Take 20 mEq by mouth daily.     Yes Historical Provider, MD  pramipexole (MIRAPEX) 0.25 MG tablet Take 0.25  mg by mouth at bedtime.   Yes Historical Provider, MD  traMADol (ULTRAM) 50 MG tablet Take 50 mg by mouth 2 (two) times daily as needed. For pain   Yes Historical Provider, MD  triamcinolone (KENALOG) 0.1 % cream Apply 1 application topically Three times a day. 07/09/11  Yes Historical Provider, MD  Vitamin D, Ergocalciferol, (DRISDOL) 50000 UNITS CAPS Take 50,000 Units by mouth every 7 (seven) days. Saturdays   Yes Historical Provider, MD  warfarin (COUMADIN) 5 MG tablet Take 5 mg by mouth daily.   Yes Historical Provider, MD   Allergies:  No Known Allergies  Social History:   reports that she has never smoked. She has never used smokeless tobacco. She reports that she does not drink alcohol or use illicit drugs. Lives with her husband, [name], and her son, [name].  Has 2 other children.  [name] is mentally handicapped and has a severe seizure disorder, with PEG in place. 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  Family History: Family History  Problem Relation Age of Onset  . Diabetes Sister   . Colon cancer Brother 40   Physical Exam: Filed Vitals:   10/07/11 0151 10/07/11 0222 10/07/11 0255 10/07/11 0335  BP: 93/55 96/54 104/59 111/57  Pulse: 59  59 60  Temp:    97.7 F (36.5 C)  TempSrc:    Oral  Resp: 12 14 12 16   Height:    5\' 6"  (1.676 m)  Weight:    113.5 kg (250 lb 3.6 oz)  SpO2: 100% 100% 94% 100%   Blood pressure 111/57, pulse 60, temperature 97.7 F (36.5 C), temperature source Oral, resp. rate 16, height 5\' 6"  (1.676 m), weight 113.5 kg (250 lb 3.6 oz), SpO2 100.00%.  Gen: Obese F in bed, appears quite sleepy but is easily awoken, answers  appropriately, then dozes back off and is awoken again. Can relate history well, is  in no distress, is pleasant and nice.  HEENT: Pupils round, reactive, normal appearing, mouth moist, with dentures in,  normal Lungs: CTAB no w/c/r, normal exam Heart: RRR, no m/g, normal exam Abd: Overweight but soft, NT ND, benign exam Extrem:  Warm, perfusing normally, radials a bit faint though but no coolness,  cyanosis. BLE's without pitting edema, but quite prominent venous varicosities. I  cannot really find the bleeding site on her right leg, but there is dried blood  under the toenails on the right but not the left.  Neuro: Alert but fatigued and dozing off, speech clear, CN 2-12 intact, moves  extremities, sits up in bed without assistance. Grossly non focal  Labs & Imaging Results for orders placed during the hospital encounter of 10/07/11 (from the past 48 hour(s))  CBC     Status: Abnormal   Collection Time   10/07/11 12:34 AM      Component Value Range Comment   WBC 10.3  4.0 - 10.5 (K/uL)    RBC 3.17 (*) 3.87 - 5.11 (MIL/uL)    Hemoglobin 10.2 (*) 12.0 - 15.0 (g/dL)    HCT 16.1 (*) 09.6 - 46.0 (%)    MCV 95.9  78.0 - 100.0 (fL)    MCH 32.2  26.0 - 34.0 (pg)    MCHC 33.6  30.0 - 36.0 (g/dL)    RDW 04.5  40.9 - 81.1 (%)    Platelets 216  150 - 400 (K/uL)   PROTIME-INR     Status: Abnormal   Collection Time   10/07/11 12:34 AM      Component Value Range Comment   Prothrombin Time 29.1 (*) 11.6 - 15.2 (seconds)    INR 2.70 (*) 0.00 - 1.49    APTT     Status: Abnormal   Collection Time   10/07/11 12:34 AM      Component Value Range Comment   aPTT 40 (*) 24 - 37 (seconds)   LACTIC ACID, PLASMA     Status: Abnormal   Collection Time   10/07/11  1:26 AM      Component Value Range Comment   Lactic Acid, Venous 2.3 (*) 0.5 - 2.2 (mmol/L)   BASIC METABOLIC PANEL     Status: Abnormal   Collection Time   10/07/11  2:09 AM      Component Value Range Comment   Sodium 136  135 - 145 (mEq/L)    Potassium 3.2 (*) 3.5 - 5.1 (mEq/L)    Chloride 100  96 - 112 (mEq/L)    CO2 29  19 - 32 (mEq/L)    Glucose, Bld 121 (*) 70 - 99 (mg/dL)    BUN 19  6 - 23 (mg/dL)    Creatinine, Ser 9.14 (*) 0.50 - 1.10 (mg/dL)    Calcium 8.6  8.4 - 10.5 (mg/dL)    GFR calc non Af Amer 47 (*) >90 (mL/min)    GFR calc Af Amer 55 (*) >90  (mL/min)    No results found.  Impression Present on Admission:  .Acute post-hemorrhagic anemia .PULMONARY EMBOLISM .Varicose veins of both lower extremities with pain  70yoF with h/o asthma, DVT/large central bilateral PE's in 12/2009 on Coumadin  presents with acute hemorrhagic anemia from supervicial venous varicosity bleed.   1. Acute hemorrhagic anemia: From venous varicosities, which she states has happened  before as well. She is also on Coumadin. Hct on arrival was 30, down from 38 in  08/2011, but actual baseline appears to be in mid-30's. Now, no current signs of  active bleeding.  - Hold on transfusion given overall current stability and improved hemodynamics.  We'll trend Hct and if stable, likely nothing further to do, except discuss whether  continued Coumadin for a PE almost two years ago is worth the risk of recurrent  bleeds.   2. Hypotension: Resolving with IVF's. Although she is sleepy appearing bc she states  she doesn't get much sleep, her mental status is clear. This could have been  hemorrhagic shock, or vasovagal in nature. Regardless, we'll continue IVF's and  monitor.  - IVF's, admit to SDU overnight. Holding atenolol, HCTZ  3. H/o bilateral central PE: Despite acute blood loss  anemia, will continue coumadin  as she has now achieved hemostasis. INR is not supratherapeutic. Daily INR's.  Risk/benefit discussion as above.   4. HL: continue zetia 5. Psych: continue prozac 6. Med rec: Unable to be fully done, but per pharmacy tech it was done on admission  and reconciled. I will hold non-essential meds for inpatient care, which are  numerous.   SDU, MC team 1  DNR/DNI, per patient request.    Other plans as per orders.  Rice Walsh 10/07/2011, 4:38 AM

## 2011-10-07 NOTE — Progress Notes (Signed)
[facility] [place] TEAM 8  Subjective: Admit history reviewed in detail.  Objective: Weight change:   Intake/Output Summary (Last 24 hours) at 10/07/11 1210 Last data filed at 10/07/11 0930  Gross per 24 hour  Intake    475 ml  Output      0 ml  Net    475 ml   Blood pressure 96/63, pulse 65, temperature 97.7 F (36.5 C), temperature source Oral, resp. rate 15, height 5\' 6"  (1.676 m), weight 113.5 kg (250 lb 3.6 oz), SpO2 98.00%.  Physical Exam: F/U exam completed  Lab Results:  [facility] 10/07/11 0730 10/07/11 0209  NA 135 136  K 3.5 3.2*  CL 102 100  CO2 26 29  GLUCOSE 104* 121*  BUN 15 19  CREATININE 0.98 1.16*  CALCIUM 8.2* 8.6  MG -- --  PHOS -- --    Basename 10/07/11 0755 10/07/11 0730 10/07/11 0034  WBC 10.2 10.1 10.3  NEUTROABS -- -- --  HGB 9.0* 9.6* 10.2*  HCT 27.0* 28.4* 30.4*  MCV 96.8 96.9 95.9  PLT 204 194 216    Micro Results: Recent Results (from the past 240 hour(s))  MRSA PCR SCREENING     Status: Normal   Collection Time   10/07/11  5:32 AM      Component Value Range Status Comment   MRSA by PCR NEGATIVE  NEGATIVE  Final     Studies/Results: All recent x-ray/radiology reports have been reviewed in detail.   Medications: I have reviewed the patient's complete medication list.  Assessment/Plan:  Acute blood loss anemia hgb is slowly drifting down, likely due to equilibration w/ IVF - cot to follow trend   Hypovolemic hypotension BP remains somewhat marginal - cont IVF resuscitation  Bleeding from LE venous varicosity This has been an intermittent issue for her previously   DVT/large central bilateral PE's in 12/2009  On chronic coumadin tx - INR is at goal   Depression w/ schizophrenia  Hyperlipidemia  Sleep apnea  Glaucoma  Lonia Blood, MD [facility] Office  [phone] Pager [phone]  On-Call/Text Page:      [email or web address]      password [facility]

## 2011-10-07 NOTE — ED Notes (Addendum)
PT was in shower; vein ruptured per pt on side of right calf. Good pedal pulse. EMS reports 750 ml blood loss in bathroom. PT is pale; lethargic. Pt sating 86% RA. PT on coumadin. Blood loss controled with pressure dressing.

## 2011-10-07 NOTE — Progress Notes (Signed)
PHARMACY - ANTICOAGULATION CONSULT INITIAL NOTE  Pharmacy Consult for: Coumadin  Indication: H/o DVT / bilateral PE (12/2009)    Patient Data:   Allergies: No Known Allergies  Patient Measurements: Height: 5\' 6"  (167.6 cm) Weight: 250 lb 3.6 oz (113.5 kg) IBW/kg (Calculated) : 59.3  Adjusted Body Weight:   Vital Signs: Temp:  [97.7 F (36.5 C)-97.8 F (36.6 C)] 97.7 F (36.5 C) (01/13 0335) Pulse Rate:  [59-74] 60  (01/13 0335) Resp:  [10-18] 16  (01/13 0335) BP: (87-111)/(48-72) 111/57 mmHg (01/13 0335) SpO2:  [85 %-100 %] 100 % (01/13 0335) FiO2 (%):  [28 %] 28 % (01/13 0335) Weight:  [250 lb 3.6 oz (113.5 kg)] 250 lb 3.6 oz (113.5 kg) (01/13 0335)  Intake/Output from previous day: No intake or output data in the 24 hours ending 10/07/11 0602  Labs:  Basename 10/07/11 0209 10/07/11 0034  HGB -- 10.2*  HCT -- 30.4*  PLT -- 216  APTT -- 40*  LABPROT -- 29.1*  INR -- 2.70*  HEPARINUNFRC -- --  CREATININE 1.16* --  CKTOTAL -- --  CKMB -- --  TROPONINI -- --   Estimated Creatinine Clearance: 59.4 ml/min (by C-G formula based on Cr of 1.16).  Medical History: Past Medical History  Diagnosis Date  . Depression   . Schizophrenia   . Arthritis   . Hyperlipidemia   . Hypertension   . Chronic pain   . 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 DVT (deep venous thrombosis)     per 01/17/10 d/c summary- "remote hx of dvt"?  . Iron deficiency anemia   . Chronic headaches   . Sleep apnea   . Glaucoma   . Hemorrhoids   . Asthma   . Anxiety   . GERD (gastroesophageal reflux disease)   . Anxiety     Scheduled medications:     . sodium chloride   Intravenous STAT  . ezetimibe  10 mg Oral Daily  . FLUoxetine  60 mg Oral Daily  . sodium chloride  500 mL Intravenous Once  . DISCONTD: FLUoxetine  20 mg Oral QHS,MR X 1     Assessment:  71 y.o. female admitted on 10/07/2011, with h/o DVT/PE presented with acute hemorrhagic anemia from  superficial venous varcosity bleed. Per physician note, hemostasis achieved and therefore Pharmacy consulted to manage Coumadin. INR is at-goal.   Goal of Therapy:  1. INR 2-3  Plan:  1. Continue home Coumadin regimen of 5mg  po daily.  2. Daily PT / INR starting 10/08/11.   Dineen Kid Thad Ranger, PharmD 10/07/2011, 6:02 AM

## 2011-10-08 LAB — BASIC METABOLIC PANEL
BUN: 6 mg/dL (ref 6–23)
Chloride: 101 mEq/L (ref 96–112)
Creatinine, Ser: 0.66 mg/dL (ref 0.50–1.10)
GFR calc Af Amer: >90 mL/min (ref >90)
Glucose, Bld: 125 mg/dL — ABNORMAL HIGH (ref 70–99)

## 2011-10-08 LAB — CBC
HCT: 25.3 % — ABNORMAL LOW (ref 36.0–46.0)
HCT: 23.7 % — ABNORMAL LOW (ref 36.0–46.0)
Hemoglobin: 8.4 g/dL — ABNORMAL LOW (ref 12.0–15.0)
MCH: 32.3 pg (ref 26.0–34.0)
MCV: 96.6 fL (ref 78.0–100.0)
MCV: 95.6 fL (ref 78.0–100.0)
Platelets: 193 10*3/uL (ref 150–400)
RBC: 2.48 MIL/uL — ABNORMAL LOW (ref 3.87–5.11)
RDW: 14 % (ref 11.5–15.5)
WBC: 6.7 10*3/uL (ref 4.0–10.5)
WBC: 7.5 10*3/uL (ref 4.0–10.5)

## 2011-10-08 LAB — GLUCOSE, CAPILLARY

## 2011-10-08 MED ORDER — FERROUS SULFATE 325 (65 FE) MG PO TABS
325.0000 mg | ORAL_TABLET | Freq: Two times a day (BID) | ORAL | Status: DC
  Administered 2011-10-08 – 2011-10-11 (×6): 325 mg via ORAL
  Filled 2011-10-08 (×8): qty 1

## 2011-10-08 MED ORDER — TRAMADOL HCL 50 MG PO TABS
50.0000 mg | ORAL_TABLET | Freq: Two times a day (BID) | ORAL | Status: DC | PRN
  Administered 2011-10-08 – 2011-10-09 (×2): 100 mg via ORAL
  Filled 2011-10-08 (×2): qty 2

## 2011-10-08 MED ORDER — PRAMIPEXOLE DIHYDROCHLORIDE 0.25 MG PO TABS
0.2500 mg | ORAL_TABLET | Freq: Every day | ORAL | Status: DC
  Administered 2011-10-08 – 2011-10-10 (×3): 0.25 mg via ORAL
  Filled 2011-10-08 (×4): qty 1

## 2011-10-08 NOTE — Progress Notes (Signed)
PHARMACY - ANTICOAGULATION CONSULT INITIAL NOTE  Pharmacy Consult for: Coumadin  Indication: H/o DVT / bilateral PE (12/2009)    Patient Data:   Allergies: No Known Allergies  Patient Measurements: Height: 5\' 6"  (167.6 cm) Weight: 250 lb 3.6 oz (113.5 kg) IBW/kg (Calculated) : 59.3  Adjusted Body Weight:   Vital Signs: Temp:  [97.6 F (36.4 C)-98.3 F (36.8 C)] 98 F (36.7 C) (01/14 0800) Pulse Rate:  [65-71] 71  (01/14 0400) Resp:  [14-16] 14  (01/14 0400) BP: (88-111)/(34-53) 111/53 mmHg (01/14 0800) SpO2:  [95 %-100 %] 100 % (01/14 0840)  Intake/Output from previous day:  Intake/Output Summary (Last 24 hours) at 10/08/11 1410 Last data filed at 10/08/11 1200  Gross per 24 hour  Intake   2160 ml  Output    700 ml  Net   1460 ml    Labs:  Basename 10/08/11 0850 10/07/11 2006 10/07/11 0755 10/07/11 0730 10/07/11 0209 10/07/11 0034  HGB 8.4* 8.0* -- -- -- --  HCT 25.3* 23.3* 27.0* -- -- --  PLT 193 189 204 -- -- --  APTT -- -- -- -- -- 40*  LABPROT -- -- -- 29.6* -- 29.1*  INR -- -- -- 2.76* -- 2.70*  HEPARINUNFRC -- -- -- -- -- --  CREATININE 0.66 -- -- 0.98 1.16* --  CKTOTAL -- -- -- -- -- --  CKMB -- -- -- -- -- --  TROPONINI -- -- -- -- -- --   Estimated Creatinine Clearance: 86.1 ml/min (by C-G formula based on Cr of 0.66).  Medical History: Past Medical History  Diagnosis Date  . Depression   . Schizophrenia   . Arthritis   . Hyperlipidemia   . Hypertension   . Chronic pain   . 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 DVT (deep venous thrombosis)     per 01/17/10 d/c summary- "remote hx of dvt"?  . Iron deficiency anemia   . Chronic headaches   . Sleep apnea   . Glaucoma   . Hemorrhoids   . Asthma   . Anxiety   . GERD (gastroesophageal reflux disease)   . Anxiety     Scheduled medications:     . clonazePAM  0.5 mg Oral BID  . ezetimibe  10 mg Oral Daily  . FLUoxetine  60 mg Oral Daily  . fluticasone  1 puff  Inhalation BID  . gabapentin  100 mg Oral QHS  . gabapentin  100 mg Oral Once  . pantoprazole  80 mg Oral Q1200  . Vitamin D (Ergocalciferol)  50,000 Units Oral Q7 days  . warfarin  5 mg Oral q1800     Assessment:  71 y.o. female admitted on 10/07/2011, with h/o DVT/PE presented with acute hemorrhagic anemia from superficial venous varcosity bleed. Per physician note, hemostasis achieved and therefore Pharmacy consulted to manage Coumadin. INR is at-goal.   No protime drawn today, will start daily labs in the morning and continue home regimen today.  Goal of Therapy:  1. INR 2-3  Plan:  1. Continue home Coumadin regimen of 5mg  po daily.  2. Daily PT / INR starting 10/09/2011  Nadara Mustard, PharmD., MS Clinical Pharmacist Pager:   [phone] 10/08/2011, 2:10 PM

## 2011-10-08 NOTE — Progress Notes (Signed)
[facility] [place] TEAM 8  Subjective: The patient is resting comfortably today.  She reports no new complaints.  She is quite worried about continuing Coumadin out of fear that she will continue to have bleeding from her multiple lower extremity venous varicosities.  She denies headache chest pain fevers chills nausea vomiting or shortness of breath.  She does report to me that she is having difficulty ambulating at home lately.  She reports that she feels weak and unstable on her feet, and states that she has fallen many times.  Objective: Weight change:   Intake/Output Summary (Last 24 hours) at 10/08/11 1400 Last data filed at 10/08/11 1200  Gross per 24 hour  Intake   2160 ml  Output    700 ml  Net   1460 ml   Blood pressure 111/53, pulse 71, temperature 98 F (36.7 C), temperature source Oral, resp. rate 14, height 5\' 6"  (1.676 m), weight 113.5 kg (250 lb 3.6 oz), SpO2 100.00%.  Physical Exam: General: No acute respiratory distress Lungs: Clear to auscultation bilaterally without wheezes or crackles Cardiovascular: Regular rate and rhythm without murmur gallop or rub normal S1 and S2 Abdomen: Nontender, nondistended, soft, bowel sounds positive, no rebound, no ascites, no appreciable mass-obese Extremities: No significant cyanosis, clubbing, or edema bilateral lower extremities-multiple venous varicosities of bilateral lower extremities but with no overlying cutaneous abrasions lacerations or ulcerations and no open wounds of any kind  Lab Results:  Basename 10/08/11 0850 10/07/11 0730 10/07/11 0209  NA 136 135 136  K 3.6 3.5 3.2*  CL 101 102 100  CO2 28 26 29   GLUCOSE 125* 104* 121*  BUN 6 15 19   CREATININE 0.66 0.98 1.16*  CALCIUM 8.1* 8.2* 8.6  MG -- -- --  PHOS -- -- --    Basename 10/08/11 0850 10/07/11 2006 10/07/11 0755  WBC 6.7 7.8 10.2  NEUTROABS -- -- --  HGB 8.4* 8.0* 9.0*  HCT 25.3* 23.3* 27.0*  MCV 96.6 95.9 96.8  PLT 193 189 204     Micro Results: Recent Results (from the past 240 hour(s))  MRSA PCR SCREENING     Status: Normal   Collection Time   10/07/11  5:32 AM      Component Value Range Status Comment   MRSA by PCR NEGATIVE  NEGATIVE  Final     Studies/Results: All recent x-ray/radiology reports have been reviewed in detail.   Medications: I have reviewed the patient's complete medication list.  Assessment/Plan:  Acute blood loss anemia Hemoglobin appears to have stabilized at approximately 8.5 - there is no evidence of active bleeding at the present time - we will recheck a hemoglobin in the a.m.  Hypovolemic hypotension The patient's blood pressure remains relatively low-she is not tachycardic and she is tolerating her current systolics in the 90-100 range very well-we will continue to hold her anti-hypertensives and follow her blood pressure trend-I wonder if some of her dizziness at home is due to overly aggressive hypertension treatment  Bleeding from LE venous varicosity This has been an intermittent issue for her previously-this appears to have resolved spontaneously at this time  DVT/large central bilateral PE's in 12/2009  On chronic coumadin tx - INR is at goal - I. have very mixed feelings about continuing her anticoagulation at this time - her history however does suggest that her previous episode of pulmonary embolism was quite critical and most definitely life-threatening - and such I do not feel comfortable discontinuing her anticoagulation at  this time - I will make an extensive review of her old records - I will request PT and OT evaluations to determine how stable the patient really is on her feet  Depression w/ schizophrenia Appears to be well-controlled at the present time  Hyperlipidemia Continue medical treatment  Hyperglycemia The patient does not carry a formal diagnosis of diabetes mellitus - she has had a number of serum glucose levels that have returned above the normal  range-I will check a hemoglobin A1c  Sleep apnea It's not clear to me if the patient has been prescribed CPAP in the outpatient setting or if she's had a sleep study-this should be investigated further in the outpatient setting  Glaucoma  Lonia Blood, MD [facility] Office  [phone] Pager [phone]  On-Call/Text Page:      [email or web address]      password [facility]

## 2011-10-09 LAB — PROTIME-INR
INR: 3.32 — ABNORMAL HIGH (ref 0.00–1.49)
Prothrombin Time: 34.2 seconds — ABNORMAL HIGH (ref 11.6–15.2)

## 2011-10-09 MED ORDER — HYDROCODONE-ACETAMINOPHEN 5-325 MG PO TABS
2.0000 | ORAL_TABLET | ORAL | Status: DC | PRN
  Administered 2011-10-09 – 2011-10-11 (×5): 2 via ORAL
  Filled 2011-10-09 (×6): qty 2

## 2011-10-09 NOTE — Progress Notes (Signed)
Physical Therapy Evaluation Patient Details Name: [name] [name] MRN: [id number] DOB: 08/19/1941 Today's Date: 10/09/2011  Problem List:  Patient Active Problem List  Diagnoses  . HYPERLIPIDEMIA  . OBESITY, UNSPECIFIED  . SCHIZOPHRENIA  . DEPRESSION  . OTHER CHRONIC PAIN  . Essential hypertension, benign  . PULMONARY EMBOLISM  . ASTHMA, PERSISTENT  . CPK, ABNORMAL  . Counseling on health promotion and disease prevention  . Itching  . Lung nodule  . Encounter for long-term (current) use of anticoagulants  . Varicose veins of both lower extremities with pain  . GERD  . Family history of malignant neoplasm of gastrointestinal tract  . Iron deficiency anemia, unspecified  . Heterozygous factor V Leiden mutation  . Anemia, normocytic normochromic  . Acute post-hemorrhagic anemia  . Hypotension    Past Medical History:  Past Medical History  Diagnosis Date  . Depression   . Schizophrenia   . Arthritis   . Hyperlipidemia   . Hypertension   . Chronic pain   . 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 DVT (deep venous thrombosis)     per 01/17/10 d/c summary- "remote hx of dvt"?  . Iron deficiency anemia   . Chronic headaches   . Sleep apnea   . Glaucoma   . Hemorrhoids   . Asthma   . Anxiety   . GERD (gastroesophageal reflux disease)   . Anxiety    Past Surgical History:  Past Surgical History  Procedure Date  . Spinal fusion     2004  . Joint replacement right knee 11/2008  . Tubal ligation year 75  . Carpal tunnel release     bilateral     PT Assessment/Plan/Recommendation PT Assessment Clinical Impression Statement: pt presents with Anemia with DVT.  pt mildly unsteady and wanting D/C to home PT Recommendation/Assessment: Patient will need skilled PT in the acute care venue PT Problem List: Decreased activity tolerance;Decreased balance;Decreased mobility;Decreased knowledge of use of DME Barriers to Discharge: Other (comment) (pt  cares for son at home) PT Therapy Diagnosis : Difficulty walking PT Plan PT Frequency: Min 3X/week PT Treatment/Interventions: DME instruction;Gait training;Functional mobility training;Therapeutic activities;Therapeutic exercise;Balance training;Patient/family education PT Recommendation Follow Up Recommendations: Home health PT;Supervision - Intermittent Equipment Recommended: None recommended by PT PT Goals  Acute Rehab PT Goals PT Goal Formulation: With patient Time For Goal Achievement: 2 weeks Pt will go Supine/Side to Sit: Independently PT Goal: Supine/Side to Sit - Progress: Goal set today Pt will go Sit to Supine/Side: Independently PT Goal: Sit to Supine/Side - Progress: Goal set today Pt will go Sit to Stand: with modified independence;with upper extremity assist PT Goal: Sit to Stand - Progress: Goal set today Pt will Ambulate: >150 feet;with modified independence;with least restrictive assistive device PT Goal: Ambulate - Progress: Goal set today  PT Evaluation Precautions/Restrictions  Precautions Precautions: Fall Restrictions Weight Bearing Restrictions: No Prior Functioning  Home Living Lives With: Spouse;Son (Son with epilepsy and feeding tube) Type of Home: House Home Layout: One level Home Access: Level entry Bathroom Shower/Tub: Tub/shower unit;Curtain Firefighter: Standard Bathroom Accessibility: Yes How Accessible: Accessible via walker Home Adaptive Equipment: Bedside commode/3-in-1;Shower chair with back;Straight cane Prior Function Level of Independence: Independent with basic ADLs;Independent with homemaking with ambulation;Independent with gait;Independent with transfers Able to Take Stairs?: No Driving: No Vocation: Retired Leisure: Hobbies-yes (Comment) Comments: 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 [employer] Orientation Level: Oriented X4 Sensation/Coordination   Extremity Assessment RLE Assessment RLE Assessment:  Within  Functional Limits LLE Assessment LLE Assessment: Within Functional Limits Mobility (including Balance) Bed Mobility Bed Mobility: Yes Supine to Sit: 5: Supervision;With rails Supine to Sit Details (indicate cue type and reason): cues to use rail Sitting - Scoot to Edge of Bed: 7: Independent Sit to Supine: 5: Supervision Transfers Transfers: Yes Sit to Stand: 5: Supervision;With upper extremity assist;From bed Sit to Stand Details (indicate cue type and reason): pt mildly unsteady and uses IV pole Stand to Sit: 5: Supervision;With upper extremity assist;To bed Ambulation/Gait Ambulation/Gait: Yes Ambulation/Gait Assistance: 4: Min assist Ambulation/Gait Assistance Details (indicate cue type and reason): mildly unsteady and cues for attention to task.   Ambulation Distance (Feet): 140 Feet Assistive device:  (IV pole) Gait Pattern: Decreased stride length;Shuffle Stairs: No Wheelchair Mobility Wheelchair Mobility: No  Posture/Postural Control Posture/Postural Control: No significant limitations Exercise    End of Session PT - End of Session Equipment Utilized During Treatment: Gait belt Activity Tolerance: Patient tolerated treatment well;Patient limited by fatigue Patient left: in bed;with call bell in reach Nurse Communication: Mobility status for transfers;Mobility status for ambulation General Behavior During Session: [facility] for tasks performed Cognition: [facility] for tasks performed  Sunny Schlein, [place] [phone] 10/09/2011, 2:23 PM

## 2011-10-09 NOTE — Progress Notes (Signed)
Pt.is upset that everytime she gets up the bed alarm goes off ,explained to her we need turn the bed alarm on for her safety,but still insisted not tohave  The bed alarm on.

## 2011-10-09 NOTE — Progress Notes (Signed)
ANTICOAGULATION CONSULT NOTE - Follow Up Consult  Pharmacy Consult for: Coumadin Indication: hx DVT, bilateral PE (12/2009)  No Known Allergies  Vital Signs: Temp: 98.3 F (36.8 C) (01/15 0505) Temp src: Oral (01/15 0505) BP: 151/88 mmHg (01/15 0507) Pulse Rate: 88  (01/15 0507)  Labs:  Basename 10/09/11 0545 10/08/11 1508 10/08/11 0850 10/07/11 2006 10/07/11 0730 10/07/11 0209 10/07/11 0034  HGB -- 8.0* 8.4* -- -- -- --  HCT -- 23.7* 25.3* 23.3* -- -- --  PLT -- 193 193 189 -- -- --  APTT -- -- -- -- -- -- 40*  LABPROT 34.2* -- -- -- 29.6* -- 29.1*  INR 3.32* -- -- -- 2.76* -- 2.70*  HEPARINUNFRC -- -- -- -- -- -- --  CREATININE -- -- 0.66 -- 0.98 1.16* --  CKTOTAL -- -- -- -- -- -- --  CKMB -- -- -- -- -- -- --  TROPONINI -- -- -- -- -- -- --   Estimated Creatinine Clearance: 86.1 ml/min (by C-G formula based on Cr of 0.66).  Assessment: 70yof on coumadin pta for hx DVT/bilateral PE admitted with hemorrhagic anemia 2/2 LE venous varcosity bleeding has now achieved hemostasis and coumadin resumed 1/14. INR today is supratherapeutic at 3.32.  Goal of Therapy:  INR 2-3   Plan:  1) No coumadin tonight 2) Follow up INR in AM  Fredrik Rigger 10/09/2011,11:51 AM

## 2011-10-09 NOTE — Progress Notes (Signed)
Occupational Therapy Evaluation Patient Details Name: [name] [name] MRN: [id number] DOB: Feb 11, 1941 Today's Date: 10/09/2011  Problem List:  Patient Active Problem List  Diagnoses  . HYPERLIPIDEMIA  . OBESITY, UNSPECIFIED  . SCHIZOPHRENIA  . DEPRESSION  . OTHER CHRONIC PAIN  . Essential hypertension, benign  . PULMONARY EMBOLISM  . ASTHMA, PERSISTENT  . CPK, ABNORMAL  . Counseling on health promotion and disease prevention  . Itching  . Lung nodule  . Encounter for long-term (current) use of anticoagulants  . Varicose veins of both lower extremities with pain  . GERD  . Family history of malignant neoplasm of gastrointestinal tract  . Iron deficiency anemia, unspecified  . Heterozygous factor V Leiden mutation  . Anemia, normocytic normochromic  . Acute post-hemorrhagic anemia  . Hypotension    Past Medical History:  Past Medical History  Diagnosis Date  . Depression   . Schizophrenia   . Arthritis   . Hyperlipidemia   . Hypertension   . Chronic pain   . 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 DVT (deep venous thrombosis)     per 01/17/10 d/c summary- "remote hx of dvt"?  . Iron deficiency anemia   . Chronic headaches   . Sleep apnea   . Glaucoma   . Hemorrhoids   . Asthma   . Anxiety   . GERD (gastroesophageal reflux disease)   . Anxiety    Past Surgical History:  Past Surgical History  Procedure Date  . Spinal fusion     2004  . Joint replacement right knee 11/2008  . Tubal ligation year 11  . Carpal tunnel release     bilateral     OT Assessment/Plan/Recommendation OT Assessment Clinical Impression Statement: Pt. presents with anemia and history of DVT and bilateral PE's with decreased activity tolerance and generalized weakness. Will benefit from acute OT to increase functional independence with ADLs to supervision level at D/C. OT Recommendation/Assessment: Patient will need skilled OT in the acute care venue OT Problem  List: Decreased strength;Decreased activity tolerance;Decreased safety awareness;Decreased knowledge of use of DME or AE Barriers to Discharge: None OT Therapy Diagnosis : Generalized weakness OT Plan OT Frequency: Min 2X/week OT Treatment/Interventions: Self-care/ADL training;Therapeutic activities;Patient/family education;Balance training;DME and/or AE instruction;Energy conservation OT Recommendation Follow Up Recommendations: Home health OT;Supervision - Intermittent Equipment Recommended: None recommended by OT Individuals Consulted Consulted and Agree with Results and Recommendations: Patient OT Goals Acute Rehab OT Goals OT Goal Formulation: With patient Time For Goal Achievement: 2 weeks ADL Goals Pt Will Perform Grooming: Standing at sink;with modified independence ADL Goal: Grooming - Progress: Goal set today Pt Will Perform Lower Body Bathing: with set-up;Sit to stand from bed ADL Goal: Lower Body Bathing - Progress: Goal set today Pt Will Perform Lower Body Dressing: with set-up;with supervision;Sit to stand from bed ADL Goal: Lower Body Dressing - Progress: Goal set today Pt Will Transfer to Toilet: with modified independence;3-in-1;with DME ADL Goal: Toilet Transfer - Progress: Goal set today Pt Will Perform Tub/Shower Transfer: Tub transfer;with set-up;with supervision;with DME;Transfer tub bench ADL Goal: Tub/Shower Transfer - Progress: Goal set today  OT Evaluation Precautions/Restrictions  Precautions Precautions: Fall Restrictions Weight Bearing Restrictions: No Prior Functioning Home Living Lives With: Spouse;Son (Son with epilepsy and feeding tube) Type of Home: House Home Layout: One level Home Access: Level entry Bathroom Shower/Tub: Tub/shower unit;Curtain Bathroom Toilet: Standard Bathroom Accessibility: Yes How Accessible: Accessible via walker Home Adaptive Equipment: Bedside commode/3-in-1;Shower chair with back;Straight cane  Prior  Function Level of Independence: Independent with basic ADLs;Independent with homemaking with ambulation;Independent with gait;Independent with transfers Able to Take Stairs?: No Driving: No Vocation: Retired Leisure: Hobbies-yes (Comment) 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 with increased difficulty reaching feet Where Assessed - Lower Body Bathing: Sitting, bed Upper Body Dressing: Performed;Minimal assistance Upper Body Dressing Details (indicate cue type and reason): With donning gown Where Assessed - Upper Body Dressing: Sitting, bed Lower Body Dressing: Simulated;Moderate assistance Where Assessed - Lower Body Dressing: Sit to stand from bed Toilet Transfer: Minimal assistance;Performed Toilet Transfer Details (indicate cue type and reason): Min verbal cues for hand placement and safety of technique Toilet Transfer Method: Stand pivot Toilet Transfer Equipment: Bedside commode Toileting - Clothing Manipulation: Simulated;Minimal assistance Toileting - Clothing Manipulation Details (indicate cue type and reason): with moving gown Where Assessed - Glass blower/designer Manipulation: Standing Toileting - Hygiene: Performed;Minimal assistance Toileting - Hygiene Details (indicate cue type and reason): assist for thoroughness  Where Assessed - Toileting Hygiene: Standing Tub/Shower Transfer: Not assessed Tub/Shower Transfer Method: Not assessed Ambulation Related to ADLs:  Pt. provided with min assist hand held and use of IV pole  Vision/Perception  Vision - History Baseline Vision: Wears glasses all the time Patient Visual Report: No change from baseline Vision - Assessment Eye Alignment: Within Functional Limits Cognition Cognition Arousal/Alertness: Awake/alert Orientation Level: Oriented X4 Sensation/Coordination Coordination Gross Motor Movements are Fluid and Coordinated: Yes Fine Motor Movements are Fluid and Coordinated: Yes Extremity Assessment RUE Assessment RUE Assessment: Within Functional Limits LUE Assessment LUE Assessment: Within Functional Limits Mobility  Bed Mobility Bed Mobility: Yes Supine to Sit: 5: Supervision;With rails Supine to Sit Details (indicate cue type and reason): cues to use rail Sitting - Scoot to Edge of Bed: 7: Independent Sit to Supine: 5: Supervision Transfers Transfers: Yes Sit to Stand: 5: Supervision;With upper extremity assist;From bed Sit to Stand Details (indicate cue type and reason): pt mildly unsteady and uses IV pole Stand to Sit: 5: Supervision;With upper extremity assist;To bed    End of Session OT - End of Session Equipment Utilized During Treatment: Gait belt Activity Tolerance: Patient tolerated treatment well Patient left: in chair;with call bell in reach Nurse Communication: Mobility status for transfers General Behavior During Session: [facility] for tasks performed Cognition: [facility] for tasks performed   Philopateer Strine, OTR/L Pager [phone] 10/09/2011, 2:44 PM

## 2011-10-09 NOTE — Progress Notes (Signed)
[facility]  TEAM 8  Subjective: The patient has ambulated down the hall with PT. Home health PT and OT has been recommended and she is in agreement with this plan. She is unable to go home today due to transportation issues and continued pain.   Objective: Weight change:   Intake/Output Summary (Last 24 hours) at 10/09/11 1708 Last data filed at 10/09/11 0916  Gross per 24 hour  Intake   1640 ml  Output    700 ml  Net    940 ml   Blood pressure 106/65, pulse 96, temperature 97.9 F (36.6 C), temperature source Oral, resp. rate 19, height 5\' 6"  (1.676 m), weight 113.5 kg (250 lb 3.6 oz), SpO2 96.00%.  Physical Exam: General: No acute respiratory distress Lungs: Clear to auscultation bilaterally without wheezes or crackles Cardiovascular: Regular rate and rhythm without murmur gallop or rub normal S1 and S2 Abdomen: Nontender, nondistended, soft, bowel sounds positive, no rebound, no ascites, no appreciable mass-obese Extremities: No significant cyanosis, clubbing, or edema bilateral lower extremities-multiple venous varicosities of bilateral lower extremities but with no overlying cutaneous abrasions lacerations or ulcerations and no open wounds of any kind  Lab Results:  Basename 10/08/11 0850 10/07/11 0730 10/07/11 0209  NA 136 135 136  K 3.6 3.5 3.2*  CL 101 102 100  CO2 28 26 29   GLUCOSE 125* 104* 121*  BUN 6 15 19   CREATININE 0.66 0.98 1.16*  CALCIUM 8.1* 8.2* 8.6  MG -- -- --  PHOS -- -- --    Basename 10/08/11 1508 10/08/11 0850 10/07/11 2006  WBC 7.5 6.7 7.8  NEUTROABS -- -- --  HGB 8.0* 8.4* 8.0*  HCT 23.7* 25.3* 23.3*  MCV 95.6 96.6 95.9  PLT 193 193 189    Micro Results: Recent Results (from the past 240 hour(s))  MRSA PCR SCREENING     Status: Normal   Collection Time   10/07/11  5:32 AM      Component Value Range Status Comment   MRSA by PCR NEGATIVE  NEGATIVE  Final     Studies/Results: All recent x-ray/radiology reports have  been reviewed in detail.   Medications: I have reviewed the patient's complete medication list.  Assessment/Plan:  Acute blood loss anemia Hemoglobin appears to have stabilized at approximately 8.5 - there is no evidence of active bleeding at the present time - Hemoglobin is 7.8 today.   Hypovolemic hypotension The patient's blood pressure remains relatively low-she is not tachycardic and she is tolerating her current systolics in the 90-100 range very well-we will continue to hold her anti-hypertensives and follow her blood pressure trend-I wonder if some of her dizziness at home is due to overly aggressive hypertension treatment  Bleeding from LE venous varicosity This has been an intermittent issue for her previously-this appears to have resolved spontaneously at this time  DVT/large central bilateral PE's in 12/2009  On chronic coumadin tx - INR is at goal - I. have very mixed feelings about continuing her anticoagulation at this time - her history however does suggest that her previous episode of pulmonary embolism was quite critical and most definitely life-threatening - and such I do not feel comfortable discontinuing her anticoagulation at this time -  Depression w/ schizophrenia Appears to be well-controlled at the present time  Hyperlipidemia Continue medical treatment  Hyperglycemia The patient does not carry a formal diagnosis of diabetes mellitus - she has had a number of serum glucose levels that have returned above the  normal range- A1c is mildly elevated.  Will request a dietician consult for instruction on a low carb diet. I have discussed weight loss with her.   Sleep apnea It's not clear  if the patient has been prescribed CPAP in the outpatient setting or if she's had a sleep study-this should be investigated further in the outpatient setting  Glaucoma  Calvert Cantor, MD [facility] Office  [phone] Pager [phone]  On-Call/Text Page:       [email or web address]      password [facility]

## 2011-10-10 ENCOUNTER — Telehealth: Payer: Self-pay | Admitting: Oncology

## 2011-10-10 LAB — PROTIME-INR: INR: 2.45 — ABNORMAL HIGH (ref 0.00–1.49)

## 2011-10-10 MED ORDER — GABAPENTIN 100 MG PO CAPS
100.0000 mg | ORAL_CAPSULE | Freq: Two times a day (BID) | ORAL | Status: DC
  Administered 2011-10-10 – 2011-10-11 (×3): 100 mg via ORAL
  Filled 2011-10-10 (×4): qty 1

## 2011-10-10 MED ORDER — GABAPENTIN 100 MG PO CAPS
100.0000 mg | ORAL_CAPSULE | Freq: Two times a day (BID) | ORAL | Status: DC
  Filled 2011-10-10: qty 1

## 2011-10-10 MED ORDER — ATENOLOL 100 MG PO TABS
100.0000 mg | ORAL_TABLET | Freq: Every day | ORAL | Status: DC
  Administered 2011-10-10 – 2011-10-11 (×2): 100 mg via ORAL
  Filled 2011-10-10 (×2): qty 1

## 2011-10-10 MED ORDER — HYDROCHLOROTHIAZIDE 25 MG PO TABS
25.0000 mg | ORAL_TABLET | Freq: Every day | ORAL | Status: DC
  Administered 2011-10-10 – 2011-10-11 (×2): 25 mg via ORAL
  Filled 2011-10-10 (×2): qty 1

## 2011-10-10 NOTE — Progress Notes (Signed)
PT and OT recommendig HHPT and HHOT. Spoke with patient about [facility]HC for HHPT and OT. She chose [facility] from [employer] [facility] agencies. She stated that she had worked with [facility] in the past.  Dava Najjar at [facility] and requested HHPT nad OT. Orders pending. CM will continue to follow.

## 2011-10-10 NOTE — Plan of Care (Signed)
Problem: Food- and Nutrition-Related Knowledge Deficit (NB-1.1) Goal: Nutrition education Formal process to instruct or train a patient/client in a skill or to impart knowledge to help patients/clients voluntarily manage or modify food choices and eating behavior to maintain or improve health.  Outcome: Completed/Met Date Met:  10/10/11 RD consulted to speak with patient regarding new onset DM and low fat diet. Patient is very motivated to make diet changes because she takes care of her son wants to be able to continue. RD went over carbohydrate containing foods, serving sizes, and amount of carbohydrate to eat. RD also spoke with patient about general healthy eating for weight loss, including low fat foods. Patient asked appropriate questions and verbalized understanding. Patient does not do the grocery shopping or cooking, but is willing to pass along all information to her husband who does those tasks. Per patient, husband reads food labels and eats healthy foods. RD left patient with [employer] carbohydrate counting hand out. Patient had no additional questions. RD reviewed chart, no additional nutrition interventions at this time.   [name] [name]

## 2011-10-10 NOTE — Progress Notes (Addendum)
[facility] [place] TEAM 8  Subjective: Patient feels fine today. No further bleeding noted.  Objective: Weight change:   Intake/Output Summary (Last 24 hours) at 10/10/11 1810 Last data filed at 10/10/11 1500  Gross per 24 hour  Intake    598 ml  Output      0 ml  Net    598 ml   Blood pressure 184/94, pulse 76, temperature 98.5 F (36.9 C), temperature source Oral, resp. rate 20, height 5\' 6"  (1.676 m), weight 113.5 kg (250 lb 3.6 oz), SpO2 95.00%.  Physical Exam: General: No acute respiratory distress Lungs: Clear to auscultation bilaterally without wheezes or crackles Cardiovascular: Regular rate and rhythm without murmur gallop or rub normal S1 and S2 Abdomen: Nontender, nondistended, soft, bowel sounds positive, no rebound, no ascites, no appreciable mass-obese Extremities: No significant cyanosis, clubbing, or edema bilateral lower extremities-multiple venous varicosities of bilateral lower extremities but with no overlying cutaneous abrasions lacerations or ulcerations and no open wounds of any kind  Lab Results:  [facility] 10/08/11 0850  NA 136  K 3.6  CL 101  CO2 28  GLUCOSE 125*  BUN 6  CREATININE 0.66  CALCIUM 8.1*  MG --  PHOS --    Basename 10/08/11 1508 10/08/11 0850 10/07/11 2006  WBC 7.5 6.7 7.8  NEUTROABS -- -- --  HGB 8.0* 8.4* 8.0*  HCT 23.7* 25.3* 23.3*  MCV 95.6 96.6 95.9  PLT 193 193 189    Micro Results: Recent Results (from the past 240 hour(s))  MRSA PCR SCREENING     Status: Normal   Collection Time   10/07/11  5:32 AM      Component Value Range Status Comment   MRSA by PCR NEGATIVE  NEGATIVE  Final     Studies/Results: All recent x-ray/radiology reports have been reviewed in detail.   Medications: I have reviewed the patient's complete medication list.  Assessment/Plan:  Acute blood loss anemia Hemoglobin appears to have stabilized at approximately, there is no evidence of active bleeding at the present time -  Hemoglobin stable.   Hypovolemic hypotension Patient's blood pressure at the time of admission was 90s to 100s. Blood pressure medications were held secondary to this. Now blood pressure in the 180s systolic over 104 diastolic. Will resume her home medication. Plan for discharge tomorrow  Bleeding from LE venous varicosity This has been an intermittent,now appears to have resolved spontaneously at this time  DVT/large central bilateral PE's in 12/2009  On chronic coumadin tx - INR is at goal - I. have very mixed feelings about continuing her anticoagulation at this time - her history however does suggest that her previous episode of pulmonary embolism was quite critical and most definitely life-threatening - and such I do not feel comfortable discontinuing her anticoagulation at this time -  Depression w/ schizophrenia Appears to be well-controlled at the present time  Hyperlipidemia Continue medical treatment  Hyperglycemia The patient does not carry a formal diagnosis of diabetes mellitus - she has had a number of serum glucose levels that have returned above the normal range- A1c is mildly elevated.  Will request a dietician consult for instruction on a low carb diet. I have discussed weight loss with her. Followup outpatient with PCP.  Disposition: Plan for discharge tomorrow if blood pressure improves.  Sleep apnea It's not clear  if the patient has been prescribed CPAP in the outpatient setting or if she's had a sleep study-this should be investigated further in the outpatient  setting  Glaucoma  Molli Posey MD.

## 2011-10-10 NOTE — Progress Notes (Signed)
ANTICOAGULATION CONSULT NOTE - Follow Up Consult  Pharmacy Consult for Coumadin Indication: hx DVT/bilateral PE  No Known Allergies  Patient Measurements: Height: 5\' 6"  (167.6 cm) Weight: 250 lb 3.6 oz (113.5 kg) IBW/kg (Calculated) : 59.3  Adjusted Body Weight:   Vital Signs: Temp: 98.3 F (36.8 C) (01/16 0551) Temp src: Oral (01/16 0551) BP: 168/104 mmHg (01/16 0557) Pulse Rate: 79  (01/16 0557)  Labs:  Basename 10/10/11 0605 10/09/11 0545 10/08/11 1508 10/08/11 0850 10/07/11 2006  HGB -- -- 8.0* 8.4* --  HCT -- -- 23.7* 25.3* 23.3*  PLT -- -- 193 193 189  APTT -- -- -- -- --  LABPROT 27.0* 34.2* -- -- --  INR 2.45* 3.32* -- -- --  HEPARINUNFRC -- -- -- -- --  CREATININE -- -- -- 0.66 --  CKTOTAL -- -- -- -- --  CKMB -- -- -- -- --  TROPONINI -- -- -- -- --   Estimated Creatinine Clearance: 86.1 ml/min (by C-G formula based on Cr of 0.66).  Assessment: [name] [name] on coumadin PTA for hx DVT/bilateral PE admitted with hemorrhagic anemia 2/2 LE venous varcosity bleeding has now achieved hemostasis and coumadin resumed 1/13. INR (2.45) is therapeutic on pt's home regimen (5mg  daily). - No CBC since 1/14 - No significant bleeding reported  Goal of Therapy:  INR 2-3   Plan:  1. Continue Coumadin 5mg  daily 2. Follow-up AM INR  [name] [name] [phone] 10/10/2011,9:27 AM

## 2011-10-10 NOTE — Telephone Encounter (Signed)
Coumadin appt for 1/17th cancelled per [facility] PharmD

## 2011-10-10 NOTE — Progress Notes (Signed)
10/10/2011 [name] [name] SPARKS Case Management Note [phone] Utilization review completed.  

## 2011-10-11 ENCOUNTER — Other Ambulatory Visit: Payer: Medicare Other | Admitting: Lab

## 2011-10-11 ENCOUNTER — Ambulatory Visit: Payer: Medicare Other

## 2011-10-11 LAB — CBC
HCT: 23.8 % — ABNORMAL LOW (ref 36.0–46.0)
MCHC: 33.6 g/dL (ref 30.0–36.0)
Platelets: 215 10*3/uL (ref 150–400)
RDW: 14.5 % (ref 11.5–15.5)
WBC: 6.3 10*3/uL (ref 4.0–10.5)

## 2011-10-11 LAB — BASIC METABOLIC PANEL
BUN: 5 mg/dL — ABNORMAL LOW (ref 6–23)
Calcium: 9.1 mg/dL (ref 8.4–10.5)
Creatinine, Ser: 0.73 mg/dL (ref 0.50–1.10)
GFR calc Af Amer: >90 mL/min (ref >90)
GFR calc non Af Amer: 86 mL/min — ABNORMAL LOW (ref >90)

## 2011-10-11 LAB — PROTIME-INR
INR: 2.39 — ABNORMAL HIGH (ref 0.00–1.49)
Prothrombin Time: 26.5 seconds — ABNORMAL HIGH (ref 11.6–15.2)

## 2011-10-11 MED ORDER — HYDROCODONE-ACETAMINOPHEN 5-325 MG PO TABS: 2.0000 | ORAL_TABLET | ORAL | Status: AC | PRN

## 2011-10-11 MED ORDER — DIPHENHYDRAMINE HCL 25 MG PO CAPS
25.0000 mg | ORAL_CAPSULE | Freq: Once | ORAL | Status: AC
  Administered 2011-10-11: 25 mg via ORAL
  Filled 2011-10-11: qty 1

## 2011-10-11 MED ORDER — ATENOLOL 100 MG PO TABS: 50.0000 mg | ORAL_TABLET | Freq: Every day | ORAL | Status: DC

## 2011-10-11 NOTE — Progress Notes (Signed)
PT DISCHARGE NOTE  Pt. Has meet all goals and education completed. Will discharge from physical therapy services. Recommend HHPT at discharge. Thanks 10/11/2011 Fredrich Birks PTA [phone] pager [phone] office

## 2011-10-11 NOTE — Progress Notes (Signed)
Occupational Therapy Treatment Patient Details Name: ILIANA HUTT MRN: [id number] DOB: [birth year] Today's Date: 10/11/2011  OT Assessment/Plan OT Assessment/Plan Comments on Treatment Session: Pt. doing well.  For discharge home today.  Pt. is independent to supervision with all BADLs.  No follow up OT recommended OT Plan: Discharge plan needs to be updated Follow Up Recommendations: No OT follow up Equipment Recommended: None recommended by OT OT Goals ADL Goals ADL Goal: Tub/Shower Transfer - Progress: Met  OT Treatment Precautions/Restrictions  Precautions Precautions: Fall Restrictions Weight Bearing Restrictions: No   ADL ADL Tub/Shower Transfer: Landscape architect Details (indicate cue type and reason): with UE support Tub/Shower Transfer Method: Science writer: Shower seat with back Ambulation Related to ADLs: pt. ambulating with supervision in halls; independent in room ADL Comments: Pt. up ambulating in room, packing items for discharge.  She reports she performed bath today independently and has been ambulating to BR.  Practiced simulated tub transfer.  Husband will be available to provide supervision. Mobility  Transfers Transfers: Yes Sit to Stand: 7: Independent Stand to Sit: 7: Independent Exercises    End of Session OT - End of Session Activity Tolerance: Patient tolerated treatment well Patient left: Other (comment) (ambulating in room) General Behavior During Session: [facility] for tasks performed Cognition: [facility] for tasks performed  Hamsini Verrilli M  10/11/2011, 5:41 PM

## 2011-10-11 NOTE — Progress Notes (Signed)
Agree with PTA.    Kimiyo Carmicheal, PT [phone]  

## 2011-10-11 NOTE — Progress Notes (Signed)
Agree with PTA.    Chanise Habeck, PT [phone]  

## 2011-10-11 NOTE — Progress Notes (Signed)
ANTICOAGULATION CONSULT NOTE - Follow Up Consult  Pharmacy Consult for  warfarin Indication: pulmonary embolus  No Known Allergies  Patient Measurements: Height: 5\' 6"  (167.6 cm) Weight: 250 lb 3.6 oz (113.5 kg) IBW/kg (Calculated) : 59.3    Vital Signs: Temp: 98 F (36.7 C) (01/17 0416) Temp src: Oral (01/17 0416) BP: 110/71 mmHg (01/17 0416) Pulse Rate: 68  (01/17 0416)  Labs:  Basename 10/11/11 0555 10/10/11 0605 10/09/11 0545 10/08/11 1508  HGB 8.0* -- -- 8.0*  HCT 23.8* -- -- 23.7*  PLT 215 -- -- 193  APTT -- -- -- --  LABPROT 26.5* 27.0* 34.2* --  INR 2.39* 2.45* 3.32* --  HEPARINUNFRC -- -- -- --  CREATININE 0.73 -- -- --  CKTOTAL -- -- -- --  CKMB -- -- -- --  TROPONINI -- -- -- --   Estimated Creatinine Clearance: 86.1 ml/min (by C-G formula based on Cr of 0.73).   Medications:  Prescriptions prior to admission  Medication Sig Dispense Refill  . albuterol (VENTOLIN HFA) 108 (90 BASE) MCG/ACT inhaler Inhale 2 puffs into the lungs every 4 (four) hours as needed. For shortness of breath or wheezing.       Marland Kitchen atenolol (TENORMIN) 100 MG tablet Take 100 mg by mouth daily.        . clonazePAM (KLONOPIN) 0.5 MG tablet Take 0.5 mg by mouth 2 (two) times daily.       Marland Kitchen esomeprazole (NEXIUM) 40 MG capsule Take 40 mg by mouth daily. For reflux      . ezetimibe (ZETIA) 10 MG tablet Take 10 mg by mouth daily.        . ferrous sulfate 325 (65 FE) MG tablet Take 325 mg by mouth 2 (two) times daily with a meal.      . FLUoxetine (PROZAC) 20 MG capsule Take 60 mg by mouth daily.      . fluticasone (FLOVENT HFA) 220 MCG/ACT inhaler Inhale 1 puff into the lungs 2 (two) times daily as needed. Cough/shortness of breath      . gabapentin (NEURONTIN) 100 MG capsule Take 100 mg by mouth at bedtime.        . hydrochlorothiazide 25 MG tablet Take 25 mg by mouth daily. For high blood pressure       . meclizine (ANTIVERT) 25 MG tablet Take 25 mg by mouth every 8 (eight) hours as needed.  For dizziness       . oxyCODONE-acetaminophen (PERCOCET) 5-325 MG per tablet Take 1 tablet by mouth Every 4 hours as needed. For pain      . potassium chloride SA (K-DUR,KLOR-CON) 20 MEQ tablet Take 20 mEq by mouth daily.        . pramipexole (MIRAPEX) 0.25 MG tablet Take 0.25 mg by mouth at bedtime.      . traMADol (ULTRAM) 50 MG tablet Take 50 mg by mouth 2 (two) times daily as needed. For pain      . triamcinolone (KENALOG) 0.1 % cream Apply 1 application topically Three times a day.      . Vitamin D, Ergocalciferol, (DRISDOL) 50000 UNITS CAPS Take 50,000 Units by mouth every 7 (seven) days. Saturdays      . warfarin (COUMADIN) 5 MG tablet Take 5 mg by mouth daily.       Scheduled:    . atenolol  100 mg Oral Daily  . clonazePAM  0.5 mg Oral BID  . diphenhydrAMINE  25 mg Oral Once  . ezetimibe  10  mg Oral Daily  . ferrous sulfate  325 mg Oral BID WC  . FLUoxetine  60 mg Oral Daily  . fluticasone  1 puff Inhalation BID  . gabapentin  100 mg Oral BID  . hydrochlorothiazide  25 mg Oral Daily  . pantoprazole  80 mg Oral Q1200  . pramipexole  0.25 mg Oral QHS  . Vitamin D (Ergocalciferol)  50,000 Units Oral Q7 days  . warfarin  5 mg Oral q1800  . DISCONTD: gabapentin  100 mg Oral QHS  . DISCONTD: gabapentin  100 mg Oral BID   Infusions:   PRN: acetaminophen, acetaminophen, alum & mag hydroxide-simeth, HYDROcodone-acetaminophen, meclizine, ondansetron (ZOFRAN) IV, ondansetron, traMADol  Assessment: 71yo female s/p diagnosis of bilateral PE.  Receiving coumadin 5mg  daily (home dose for prevention of another PE). Patient has Factor V Leidan and this may be reason for her increased VTEs. Hemoglobin/hct 23.8/8 and bleeding has stopped.  INR today is 2.39 and is therapeutic.  Patient to be discharged tomorrow on long term warfarin.  Goal of Therapy:  INR 2-3 to prevent further clots.   Plan:  Coumadin 5mg  daily at 1800. Check INR in am. Complete patient teaching before  discharge.  Walden Field B 10/11/2011,10:26 AM

## 2011-10-11 NOTE — Progress Notes (Signed)
Physical Therapy Treatment Patient Details Name: [name] [name] MRN: [id number] DOB: 1940-11-30 Today's Date: 10/11/2011  PT Assessment/Plan  PT - Assessment/Plan Comments on Treatment Session: Pt progressing well. Per note should DC home today. PT Plan: All goals met and education completed, patient dischaged from PT services PT Frequency: Min 3X/week Follow Up Recommendations: Home health PT;Supervision - Intermittent Equipment Recommended: None recommended by PT PT Goals  Acute Rehab PT Goals PT Goal: Supine/Side to Sit - Progress: Met PT Goal: Sit to Supine/Side - Progress: Met PT Goal: Sit to Stand - Progress: Met PT Goal: Ambulate - Progress: Met  PT Treatment Precautions/Restrictions  Precautions Precautions: Fall Restrictions Weight Bearing Restrictions: No Mobility (including Balance) Bed Mobility Supine to Sit: 6: Modified independent (Device/Increase time) Sitting - Scoot to Edge of Bed: 6: Modified independent (Device/Increase time) Sit to Supine: 6: Modified independent (Device/Increase time) Transfers Sit to Stand: 7: Independent Stand to Sit: 7: Independent Ambulation/Gait Ambulation/Gait Assistance: 6: Modified independent (Device/Increase time) Ambulation/Gait Assistance Details (indicate cue type and reason): decreased cadence Ambulation Distance (Feet): 250 Feet Assistive device: None Gait Pattern: Step-to pattern    Exercise    End of Session PT - End of Session Activity Tolerance: Patient tolerated treatment well Patient left: in bed;with call bell in reach Nurse Communication: Mobility status for transfers;Mobility status for ambulation General Behavior During Session: [facility] for tasks performed Cognition: [facility] for tasks performed  Robinette, Adline Potter 10/11/2011, 11:18 AM 10/11/2011 Fredrich Birks PTA [phone] pager [phone] office

## 2011-10-11 NOTE — Discharge Summary (Signed)
Physician Discharge Summary  Patient ID: [name] [name] MRN: [id number] DOB/AGE: [birth year] 71 y.o.  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 Hypertension Sleep apnea Hyperglycemia Hypokalemia   Discharge Medication List as of 10/11/2011  2:22 PM    CONTINUE these medications which have CHANGED   Details  atenolol (TENORMIN) 100 MG tablet Take 0.5 tablets (50 mg total) by mouth daily., Starting 10/11/2011, Until Discontinued, Normal      CONTINUE these medications which have NOT CHANGED   Details  albuterol (VENTOLIN HFA) 108 (90 BASE) MCG/ACT inhaler Inhale 2 puffs into the lungs every 4 (four) hours as needed. For shortness of breath or wheezing. , Until Discontinued, Historical Med    clonazePAM (KLONOPIN) 0.5 MG tablet Take 0.5 mg by mouth 2 (two) times daily. , Until Discontinued, Historical Med    esomeprazole (NEXIUM) 40 MG capsule Take 40 mg by mouth daily. For reflux, Until Discontinued, Historical Med    ezetimibe (ZETIA) 10 MG tablet Take 10 mg by mouth daily.  , Until Discontinued, Historical Med    ferrous sulfate 325 (65 FE) MG tablet Take 325 mg by mouth 2 (two) times daily with a meal., Until Discontinued, Historical Med    FLUoxetine (PROZAC) 20 MG capsule Take 60 mg by mouth daily., Until Discontinued, Historical Med    fluticasone (FLOVENT HFA) 220 MCG/ACT inhaler Inhale 1 puff into the lungs 2 (two) times daily as needed. Cough/shortness of breath, Until Discontinued, Historical Med    gabapentin (NEURONTIN) 100 MG capsule Take 100 mg by mouth at bedtime.  , Until Discontinued, Historical Med    hydrochlorothiazide 25 MG tablet Take 25 mg by mouth daily. For high blood pressure , Until Discontinued, Historical Med    meclizine  (ANTIVERT) 25 MG tablet Take 25 mg by mouth every 8 (eight) hours as needed. For dizziness , Until Discontinued, Historical Med    oxyCODONE-acetaminophen (PERCOCET) 5-325 MG per tablet Take 1 tablet by mouth Every 4 hours as needed. For pain, Starting 07/24/2011, Until Discontinued, Historical Med    potassium chloride SA (K-DUR,KLOR-CON) 20 MEQ tablet Take 20 mEq by mouth daily.  , Until Discontinued, Historical Med    pramipexole (MIRAPEX) 0.25 MG tablet Take 0.25 mg by mouth at bedtime., Until Discontinued, Historical Med    traMADol (ULTRAM) 50 MG tablet Take 50 mg by mouth 2 (two) times daily as needed. For pain, Until Discontinued, Historical Med    triamcinolone (KENALOG) 0.1 % cream Apply 1 application topically Three times a day., Starting 07/09/2011, Until Discontinued, Historical Med    Vitamin D, Ergocalciferol, (DRISDOL) 50000 UNITS CAPS Take 50,000 Units by mouth every 7 (seven) days. Saturdays, Until Discontinued, Historical Med    warfarin (COUMADIN) 5 MG tablet Take 5 mg by mouth daily., Until Discontinued, Historical Med        Discharge Orders    Future Orders Please Complete By Expires   Diet - low sodium heart healthy      Increase activity slowly         Follow-up Information    Follow up with AVBUERE,EDWIN A, MD in 1 week.         Disposition: Home or Self Care  Discharged Condition: stable    Consults:  None  HPI: 71yoF with h/o asthma, DVT/large central bilateral PE's in  12/2009 on Coumadin  presents with acute hemorrhagic anemia from supervicial venous varicosity bleed.  Pt reports that she was taking a shower around midnight Saturday after feeding her  son (who has seizure d/o and takes PEG tube feeds). She noted some bleeding from  her right leg venous varicosities while in the shower and ended up calling EMS and  it was quite profuse. Per records, EMS estimated the blood loss to be around 750  cc's, which pt endorses as well, that it was "a  lot." She was also quite dizzy  which lasted through the ambulance ride, but did not have LOC, chest pain, SOB. She  states she has had bleeding from her varicosities maybe 7-8 times in the past few  years.  In the ED, vitals significant for some hypoTN to the 80-90's, no tachycardia. Noted  to be 85% on RA, improved on [place]. Labs showed normal chem panel except HCO3 31. CK  455. WBC 10.3, Hct 30.4. INR 2.7. On arrival the SDU, her BP's are now 110's with  IVF's only.    PMH:  As per H & P FH: As per  H & P SH: As per H & P Med: as per H & P Allergies and ROS: as per H&P   Discharge Exam: Blood pressure 110/71, pulse 68, temperature 98 F (36.7 C), temperature source Oral, resp. rate 20, height 5\' 6"  (1.676 m), weight 113.5 kg (250 lb 3.6 oz), SpO2 95.00%. General: No acute respiratory distress  Lungs: Clear to auscultation bilaterally without wheezes or crackles  Cardiovascular: Regular rate and rhythm without murmur gallop or rub normal S1 and S2  Abdomen: Nontender, nondistended, soft, bowel sounds positive, no rebound, no ascites, no appreciable mass-obese  Extremities: No significant cyanosis, clubbing, or edema bilateral lower extremities-multiple venous varicosities of bilateral lower extremities but with no overlying cutaneous abrasions lacerations or ulcerations and no open wounds of any kind     Hospital Course:   Acute Post-Hemorrhagic anemia Patient has a history of bleeding from lower extremity varicose veins. She stated that she was taking a bath with warm water and with dilation of her blood vessels that's when she noted the bleeding started. She was admitted to the hospital  hemoglobin has been stable and there has been no further bleeding from the varicose veins. Patient will be discharged home and she will followup with her primary care physician. She will continue with her Coumadin for her history of pulmonary embolism.  H/O PULMONARY EMBOLISM Patient has a history of  bilateral pulmonary embolism in 2011.She had  a large and possible life-threatening PE. She will remain on Coumadin. She should follow up outpatient with her primary care physician.  Varicose veins of both lower extremities with pain Patient will follow up outpatient regarding her chronic varicose veins. She may want to see a vascular surgeon for vein stripping but will defer that decision to her PCP.  Hypotension secondary to medications and acute blood loss. Patient on presentation was hypotensive from her blood loss and medication combination. Her antihypertensive medications were held. Her blood pressure then increased so prior to discharge her antihypertensives were restarted.  Hypertension As mentioned above patient's antihypertensives were held at the time of presentation secondary to hypotension her blood pressure then increased into the 180s systolic. Her antihypertensives were restarted and her blood pressure was stable at the time of discharge.  Sleep apnea Stable. Followup outpatient.  Hyperlipidemia Continue Zetia.  Hypokalemia Secondary to her diuretic. She was replaced  orally.  Hyperglycemia: Patient had a couple episodes of elevated blood sugars. Hemoglobin A1c is 6.1. She was given recommendations per dietitian of a low-carb diet. She will follow up  with her primary care physician.   Labs:   Results for orders placed during the hospital encounter of 10/07/11 (from the past 48 hour(s))  PROTIME-INR     Status: Abnormal   Collection Time   10/10/11  6:05 AM      Component Value Range Comment   Prothrombin Time 27.0 (*) 11.6 - 15.2 (seconds)    INR 2.45 (*) 0.00 - 1.49    PROTIME-INR     Status: Abnormal   Collection Time   10/11/11  5:55 AM      Component Value Range Comment   Prothrombin Time 26.5 (*) 11.6 - 15.2 (seconds)    INR 2.39 (*) 0.00 - 1.49    BASIC METABOLIC PANEL     Status: Abnormal   Collection Time   10/11/11  5:55 AM      Component Value Range  Comment   Sodium 140  135 - 145 (mEq/L)    Potassium 3.1 (*) 3.5 - 5.1 (mEq/L)    Chloride 100  96 - 112 (mEq/L)    CO2 33 (*) 19 - 32 (mEq/L)    Glucose, Bld 92  70 - 99 (mg/dL)    BUN 5 (*) 6 - 23 (mg/dL)    Creatinine, Ser 2.13  0.50 - 1.10 (mg/dL)    Calcium 9.1  8.4 - 10.5 (mg/dL)    GFR calc non Af Amer 86 (*) >90 (mL/min)    GFR calc Af Amer >90  >90 (mL/min)   CBC     Status: Abnormal   Collection Time   10/11/11  5:55 AM      Component Value Range Comment   WBC 6.3  4.0 - 10.5 (K/uL)    RBC 2.45 (*) 3.87 - 5.11 (MIL/uL)    Hemoglobin 8.0 (*) 12.0 - 15.0 (g/dL)    HCT 08.6 (*) 57.8 - 46.0 (%)    MCV 97.1  78.0 - 100.0 (fL)    MCH 32.7  26.0 - 34.0 (pg)    MCHC 33.6  30.0 - 36.0 (g/dL)    RDW 46.9  62.9 - 52.8 (%)    Platelets 215  150 - 400 (K/uL)     Time spent with patient in doing this discharge is approximately 45 minutes.   SignedEarlene Plater MD, [place] Pier 10/11/2011, 10:14 PM

## 2011-10-11 NOTE — Progress Notes (Signed)
1415 Discharge instruction reviewed with patient and husband verbalize and understand. Prescription given to patient.

## 2011-10-22 ENCOUNTER — Other Ambulatory Visit: Payer: Self-pay

## 2011-10-22 ENCOUNTER — Emergency Department (HOSPITAL_COMMUNITY)
Admission: EM | Admit: 2011-10-22 | Discharge: 2011-10-22 | Disposition: A | Discharge status: Home / Self Care | Payer: Medicare Other | Attending: Emergency Medicine | Admitting: Emergency Medicine

## 2011-10-22 ENCOUNTER — Encounter (HOSPITAL_COMMUNITY): Payer: Self-pay | Admitting: *Deleted

## 2011-10-22 ENCOUNTER — Encounter (HOSPITAL_COMMUNITY): Payer: Self-pay | Admitting: Emergency Medicine

## 2011-10-22 DIAGNOSIS — E785 Hyperlipidemia, unspecified: Secondary | ICD-10-CM | POA: Insufficient documentation

## 2011-10-22 DIAGNOSIS — I10 Essential (primary) hypertension: Secondary | ICD-10-CM | POA: Insufficient documentation

## 2011-10-22 DIAGNOSIS — H409 Unspecified glaucoma: Secondary | ICD-10-CM | POA: Insufficient documentation

## 2011-10-22 DIAGNOSIS — F341 Dysthymic disorder: Secondary | ICD-10-CM | POA: Insufficient documentation

## 2011-10-22 DIAGNOSIS — Z79899 Other long term (current) drug therapy: Secondary | ICD-10-CM | POA: Insufficient documentation

## 2011-10-22 DIAGNOSIS — D649 Anemia, unspecified: Secondary | ICD-10-CM | POA: Insufficient documentation

## 2011-10-22 DIAGNOSIS — R42 Dizziness and giddiness: Secondary | ICD-10-CM | POA: Insufficient documentation

## 2011-10-22 DIAGNOSIS — K219 Gastro-esophageal reflux disease without esophagitis: Secondary | ICD-10-CM | POA: Insufficient documentation

## 2011-10-22 DIAGNOSIS — I83893 Varicose veins of bilateral lower extremities with other complications: Secondary | ICD-10-CM | POA: Insufficient documentation

## 2011-10-22 DIAGNOSIS — G8929 Other chronic pain: Secondary | ICD-10-CM | POA: Insufficient documentation

## 2011-10-22 DIAGNOSIS — J45909 Unspecified asthma, uncomplicated: Secondary | ICD-10-CM | POA: Insufficient documentation

## 2011-10-22 DIAGNOSIS — Z86718 Personal history of other venous thrombosis and embolism: Secondary | ICD-10-CM | POA: Insufficient documentation

## 2011-10-22 DIAGNOSIS — M129 Arthropathy, unspecified: Secondary | ICD-10-CM | POA: Insufficient documentation

## 2011-10-22 DIAGNOSIS — Z7901 Long term (current) use of anticoagulants: Secondary | ICD-10-CM | POA: Insufficient documentation

## 2011-10-22 DIAGNOSIS — Z8659 Personal history of other mental and behavioral disorders: Secondary | ICD-10-CM | POA: Insufficient documentation

## 2011-10-22 DIAGNOSIS — R5381 Other malaise: Secondary | ICD-10-CM | POA: Insufficient documentation

## 2011-10-22 LAB — BASIC METABOLIC PANEL
Chloride: 102 mEq/L (ref 96–112)
GFR calc Af Amer: 82 mL/min — ABNORMAL LOW (ref >90)
GFR calc non Af Amer: 71 mL/min — ABNORMAL LOW (ref >90)
Glucose, Bld: 123 mg/dL — ABNORMAL HIGH (ref 70–99)
Potassium: 3.2 mEq/L — ABNORMAL LOW (ref 3.5–5.1)
Sodium: 139 mEq/L (ref 135–145)

## 2011-10-22 LAB — GLUCOSE, CAPILLARY
Glucose-Capillary: 120 mg/dL — ABNORMAL HIGH (ref 70–99)
Glucose-Capillary: 136 mg/dL — ABNORMAL HIGH (ref 70–99)

## 2011-10-22 LAB — DIFFERENTIAL
Eosinophils Absolute: 0.2 10*3/uL (ref 0.0–0.7)
Lymphs Abs: 1.6 10*3/uL (ref 0.7–4.0)
Neutro Abs: 3.5 10*3/uL (ref 1.7–7.7)
Neutrophils Relative %: 62 % (ref 43–77)

## 2011-10-22 LAB — CBC
Hemoglobin: 9.1 g/dL — ABNORMAL LOW (ref 12.0–15.0)
MCH: 32.4 pg (ref 26.0–34.0)
Platelets: 248 10*3/uL (ref 150–400)
RBC: 2.81 MIL/uL — ABNORMAL LOW (ref 3.87–5.11)
WBC: 5.7 10*3/uL (ref 4.0–10.5)

## 2011-10-22 LAB — APTT: aPTT: 38 seconds — ABNORMAL HIGH (ref 24–37)

## 2011-10-22 LAB — PROTIME-INR: INR: 1.93 — ABNORMAL HIGH (ref 0.00–1.49)

## 2011-10-22 MED ORDER — OXYCODONE-ACETAMINOPHEN 5-325 MG PO TABS: 1.0000 | ORAL_TABLET | Freq: Four times a day (QID) | ORAL | Status: DC | PRN

## 2011-10-22 MED ORDER — HYDROXYZINE HCL 25 MG PO TABS: 25.0000 mg | ORAL_TABLET | Freq: Four times a day (QID) | ORAL | Status: AC | PRN

## 2011-10-22 MED ORDER — OXYCODONE-ACETAMINOPHEN 5-325 MG PO TABS
1.0000 | ORAL_TABLET | Freq: Once | ORAL | Status: AC
Start: 2011-10-22 — End: 2011-10-22
  Administered 2011-10-22: 1 via ORAL
  Filled 2011-10-22: qty 1

## 2011-10-22 NOTE — ED Provider Notes (Signed)
History     CSN: [id number]  Arrival date & time 10/22/11  4098   First MD Initiated Contact with Patient 10/22/11 [phone]      Chief Complaint  Patient presents with  . Varicose Veins    (Consider location/radiation/quality/duration/timing/severity/associated sxs/prior treatment) HPI Pt recently hospitalized for same complaint. State LLE varicose vein burst 30 min prior to arrival. EMS called. Est blood loss 500 cc. Pt with no other complaints. Bleeding controlled with pressure bandage. Pt is on coumadin for PE.  Past Medical History  Diagnosis Date  . Depression   . Schizophrenia   . Arthritis   . Hyperlipidemia   . Hypertension   . Chronic pain   . 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 DVT (deep venous thrombosis)     per 01/17/10 d/c summary- "remote hx of dvt"?  . Iron deficiency anemia   . Chronic headaches   . Sleep apnea   . Glaucoma   . Hemorrhoids   . Asthma   . Anxiety   . GERD (gastroesophageal reflux disease)   . Anxiety     Past Surgical History  Procedure Date  . Spinal fusion     2004  . Joint replacement right knee 11/2008  . Tubal ligation year 6  . Carpal tunnel release     bilateral     Family History  Problem Relation Age of Onset  . Diabetes Sister   . Colon cancer Brother 40    History  Substance Use Topics  . Smoking status: Never Smoker   . Smokeless tobacco: Never Used  . Alcohol Use: No    OB History    Grav Para Term Preterm Abortions TAB SAB Ect Mult Living                  Review of Systems  Constitutional: Negative for fever and chills.  Respiratory: Negative for shortness of breath.   Cardiovascular: Negative for chest pain.  Gastrointestinal: Negative for nausea and vomiting.  Neurological: Negative for dizziness, weakness, light-headedness and headaches.    Allergies  Adhesive  Home Medications   Current Outpatient Rx  Name Route Sig Dispense Refill  . ALBUTEROL SULFATE HFA 108  (90 BASE) MCG/ACT IN AERS Inhalation Inhale 2 puffs into the lungs every 4 (four) hours as needed. For shortness of breath or wheezing.     . ATENOLOL 100 MG PO TABS Oral Take 100 mg by mouth daily.    Marland Kitchen CLONAZEPAM 0.5 MG PO TABS Oral Take 0.5 mg by mouth 2 (two) times daily.     Marland Kitchen FERROUS SULFATE 325 (65 FE) MG PO TABS Oral Take 325 mg by mouth 2 (two) times daily with a meal.    . FLUOXETINE HCL 20 MG PO CAPS Oral Take 60 mg by mouth daily.    Marland Kitchen FLUTICASONE PROPIONATE  HFA 220 MCG/ACT IN AERO Inhalation Inhale 1 puff into the lungs 2 (two) times daily as needed. Cough/shortness of breath    . GABAPENTIN 100 MG PO CAPS Oral Take 100 mg by mouth at bedtime.      Marland Kitchen HYDROCHLOROTHIAZIDE 25 MG PO TABS Oral Take 25 mg by mouth daily. For high blood pressure     . MECLIZINE HCL 25 MG PO TABS Oral Take 25 mg by mouth every 8 (eight) hours as needed. For dizziness     . OXYCODONE-ACETAMINOPHEN 5-325 MG PO TABS Oral Take 1 tablet by mouth Every 4 hours as  needed. For pain    . POTASSIUM CHLORIDE CRYS ER 20 MEQ PO TBCR Oral Take 20 mEq by mouth daily.      Marland Kitchen PRAMIPEXOLE DIHYDROCHLORIDE 0.25 MG PO TABS Oral Take 0.25 mg by mouth at bedtime.    . TRAMADOL HCL 50 MG PO TABS Oral Take 50 mg by mouth 2 (two) times daily as needed. For pain    . TRIAMCINOLONE ACETONIDE 0.1 % EX CREA Topical Apply 1 application topically Three times a day.    Marland Kitchen VITAMIN D (ERGOCALCIFEROL) 50000 UNITS PO CAPS Oral Take 50,000 Units by mouth every 7 (seven) days. Saturdays    . WARFARIN SODIUM 6 MG PO TABS Oral Take 6 mg by mouth daily.    Marland Kitchen HYDROCODONE-ACETAMINOPHEN 5-325 MG PO TABS Oral Take 2 tablets by mouth every 4 (four) hours as needed. 30 tablet 0    BP 137/100  Pulse 63  Temp(Src) 99.1 F (37.3 C) (Oral)  Resp 16  SpO2 100%  Physical Exam  Nursing note and vitals reviewed. Constitutional: She is oriented to person, place, and time. She appears well-developed and well-nourished. No distress.  HENT:  Head:  Normocephalic and atraumatic.  Mouth/Throat: Oropharynx is clear and moist.  Eyes: EOM are normal. Pupils are equal, round, and reactive to light.  Neck: Normal range of motion. Neck supple.  Cardiovascular: Normal rate and regular rhythm.   Pulmonary/Chest: Effort normal and breath sounds normal. No respiratory distress. She has no wheezes. She has no rales.  Abdominal: Soft. Bowel sounds are normal. There is no tenderness. There is no rebound and no guarding.  Musculoskeletal: Normal range of motion. She exhibits no edema and no tenderness.       Small area of steady bleeding from anterior LLE varicose. Controlled with pressure  Neurological: She is alert and oriented to person, place, and time.  Skin: Skin is warm and dry. No rash noted. No erythema.  Psychiatric: She has a normal mood and affect. Her behavior is normal.    ED Course  Procedures (including critical care time)  Labs Reviewed  CBC - Abnormal; Notable for the following:    RBC 2.81 (*)    Hemoglobin 9.1 (*)    HCT 27.4 (*)    All other components within normal limits  BASIC METABOLIC PANEL - Abnormal; Notable for the following:    Potassium 3.2 (*)    Glucose, Bld 123 (*)    GFR calc non Af Amer 71 (*)    GFR calc Af Amer 82 (*)    All other components within normal limits  PROTIME-INR - Abnormal; Notable for the following:    Prothrombin Time 22.4 (*)    INR 1.93 (*)    All other components within normal limits  APTT - Abnormal; Notable for the following:    aPTT 38 (*)    All other components within normal limits  DIFFERENTIAL   No results found.   1. Ruptured varicose vein     Suture place in sterile fashion to occlude bleeding. Pt tolerated well. Will f/u with PMD  MDM          Loren Racer, MD 10/22/11 [phone]

## 2011-10-22 NOTE — ED Notes (Signed)
Pt states that she woke up and noticed blood in the bed. Pt looked down and saw that her leg was bleeding from the varicose vein. Pt states that she was not able to get bleeding controlled due to taking coumadin and she called 911. Pt has sensation below injury and pt is able to wiggle toes and move leg. Pt bleeding controlled at this time. Pt alert and oriented and denies pain.

## 2011-10-22 NOTE — ED Notes (Signed)
Per EMS: pt states that she woke up and noticed that her Left Lower leg varicose vein had burst pt seen a week ago for right lower leg varicose vein rupture. Pt has bleeding controlled by pressure dressing, is on coumadin and took coumadin this morning. Estimated 500 ml of blood loss in bed. Pt alert and oriented VSS

## 2011-10-22 NOTE — ED Provider Notes (Signed)
Patient had been treated in the ER this morning and after discharge was sitting in the waiting room and started feeling like she might pass out. She is relates she feels better after drinking some juice. Her hemoglobin is stable, her orthostatics are unremarkable. Patient feels rated to home now.  Medical screening examination/treatment/procedure(s) were conducted as a shared visit with non-physician practitioner(s) and myself.  I personally evaluated the patient during the encounter Devoria Albe, MD, Franz Dell, MD 10/22/11 1050

## 2011-10-22 NOTE — ED Notes (Signed)
Report received, assumed care.  

## 2011-10-22 NOTE — ED Notes (Signed)
Pt just dc, waiting in the waiting room and states she got weak and almost passed out. Pt was seen earlier for varicose vein rupture.   Pt states she is dizzy and light headed.

## 2011-10-22 NOTE — ED Provider Notes (Signed)
See prior note   Ward Givens, MD 10/22/11 1538

## 2011-10-22 NOTE — ED Provider Notes (Signed)
History     CSN: [id number]  Arrival date & time 10/22/11  7829   First MD Initiated Contact with Patient 10/22/11 0930      Chief Complaint  Patient presents with  . Weakness     Patient is a 71 y.o. female presenting with weakness. The history is provided by the patient.  Weakness  Additional symptoms include weakness.  The patient was seen in the emergency department this morning for a ruptured varicose vein to the LLE, complicated by coumadin therapy, for which she had a suture placed with successful control of bleeding. She was discharged and while in the waiting room, began to feel lightheaded and weak. Denies syncope. Denies continued bleeding from her varicose vein. Denies HA, visual change, tinnitus, CP, SOB, or nausea. No aggravating factors. Symptoms began to improve with sitting down. There has been no prior treatment for this complaint.  Past Medical History  Diagnosis Date  . Depression   . Schizophrenia   . Arthritis   . Hyperlipidemia   . Hypertension   . Chronic pain   . 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 DVT (deep venous thrombosis)     per 01/17/10 d/c summary- "remote hx of dvt"?  . Iron deficiency anemia   . Chronic headaches   . Sleep apnea   . Glaucoma   . Hemorrhoids   . Asthma   . Anxiety   . GERD (gastroesophageal reflux disease)   . Anxiety     Past Surgical History  Procedure Date  . Spinal fusion     2004  . Joint replacement right knee 11/2008  . Tubal ligation year 49  . Carpal tunnel release     bilateral     Family History  Problem Relation Age of Onset  . Diabetes Sister   . Colon cancer Brother 40    History  Substance Use Topics  . Smoking status: Never Smoker   . Smokeless tobacco: Never Used  . Alcohol Use: No     Review of Systems  Neurological: Positive for weakness.  10 systems reviewed and are negative for acute change except as noted in the HPI.   Allergies  Adhesive  Home  Medications   Current Outpatient Rx  Name Route Sig Dispense Refill  . ALBUTEROL SULFATE HFA 108 (90 BASE) MCG/ACT IN AERS Inhalation Inhale 2 puffs into the lungs every 4 (four) hours as needed. For shortness of breath or wheezing.     . ATENOLOL 100 MG PO TABS Oral Take 100 mg by mouth daily.    Marland Kitchen FERROUS SULFATE 325 (65 FE) MG PO TABS Oral Take 325 mg by mouth 2 (two) times daily with a meal.    . FLUOXETINE HCL 20 MG PO CAPS Oral Take 60 mg by mouth daily.    Marland Kitchen FLUTICASONE PROPIONATE  HFA 220 MCG/ACT IN AERO Inhalation Inhale 1 puff into the lungs 2 (two) times daily as needed. Cough/shortness of breath    . HYDROCHLOROTHIAZIDE 25 MG PO TABS Oral Take 25 mg by mouth daily. For high blood pressure     . POTASSIUM CHLORIDE CRYS ER 20 MEQ PO TBCR Oral Take 20 mEq by mouth daily.      Marland Kitchen PRAMIPEXOLE DIHYDROCHLORIDE 0.25 MG PO TABS Oral Take 0.25 mg by mouth at bedtime.    . TRAMADOL HCL 50 MG PO TABS Oral Take 50 mg by mouth 2 (two) times daily as needed. For pain    .  TRIAMCINOLONE ACETONIDE 0.1 % EX CREA Topical Apply 1 application topically Three times a day.    Marland Kitchen VITAMIN D (ERGOCALCIFEROL) 50000 UNITS PO CAPS Oral Take 50,000 Units by mouth every 7 (seven) days. Sundays    . WARFARIN SODIUM 6 MG PO TABS Oral Take 6 mg by mouth daily.    Marland Kitchen HYDROCODONE-ACETAMINOPHEN 5-325 MG PO TABS Oral Take 2 tablets by mouth every 4 (four) hours as needed. 30 tablet 0  . OXYCODONE-ACETAMINOPHEN 5-325 MG PO TABS Oral Take 1 tablet by mouth Every 4 hours as needed. For pain      BP 113/58  Pulse 72  Temp(Src) 97 F (36.1 C) (Oral)  Resp 18  SpO2 96%  Physical Exam  Nursing note and vitals reviewed. Constitutional: She is oriented to person, place, and time. She appears well-developed and well-nourished. No distress.  HENT:  Head: Normocephalic and atraumatic.  Right Ear: External ear normal.  Left Ear: External ear normal.  Mouth/Throat: Oropharynx is clear and moist.  Eyes: Conjunctivae are  normal. Pupils are equal, round, and reactive to light.       Pale conjuctiva  Neck: Normal range of motion. Neck supple. No JVD present.  Cardiovascular: Normal rate, regular rhythm and intact distal pulses.   Pulmonary/Chest: Effort normal and breath sounds normal. No respiratory distress. She has no wheezes. She exhibits no tenderness.  Abdominal: Soft. Bowel sounds are normal. She exhibits no distension. There is no tenderness.  Musculoskeletal: She exhibits no edema and no tenderness.  Neurological: She is alert and oriented to person, place, and time. No cranial nerve deficit. Coordination normal.       Alert and oriented x 3.  Skin: Skin is warm and dry.       Suture in place to left lower leg with no active bleeding and no new blood on dressing.  Psychiatric: She has a normal mood and affect.    ED Course  Procedures (including critical care time)  Labs Reviewed  GLUCOSE, CAPILLARY - Abnormal; Notable for the following:    Glucose-Capillary 136 (*)    All other components within normal limits  POCT CBG MONITORING   No results found.   Date: 10/22/2011  Rate: 64  Rhythm: sinus  QRS Axis: normal  Intervals: normal  ST/T Wave abnormalities: normal  Conduction Disutrbances:none  Narrative Interpretation: early R/S transition present on prior ECG dated 04/22/2011  Old EKG Reviewed: unchanged    1. Lightheadedness   2. Anemia       MDM  Transient lightheadedness.  Symptoms resolved at time of assessment. Labs from this morning reviewed. Anemia with HGB of 9.1- this is increased from her last check of 8.0 and does not suggest new acute blood loss today is the cause of her symptoms. ECG unchanged. Orthostatic vital signs unremarkable. CBG without hypoglycemia. Will d/c home. Pt requests atarax rx to take instead of benadryl from itching secondary to rash from adhesive tape used during recent hospital admission. Also requests pain medication for varicose veins/recent  suture.       Elwyn Reach [place], [place] 10/22/11 1141

## 2011-10-29 ENCOUNTER — Other Ambulatory Visit: Payer: Self-pay | Admitting: Oncology

## 2011-10-29 ENCOUNTER — Telehealth: Payer: Self-pay | Admitting: Pharmacist

## 2011-10-29 DIAGNOSIS — D649 Anemia, unspecified: Secondary | ICD-10-CM

## 2011-10-30 ENCOUNTER — Other Ambulatory Visit (HOSPITAL_BASED_OUTPATIENT_CLINIC_OR_DEPARTMENT_OTHER): Payer: Medicare Other | Admitting: Lab

## 2011-10-30 ENCOUNTER — Ambulatory Visit: Payer: Medicare Other

## 2011-10-30 DIAGNOSIS — I2699 Other pulmonary embolism without acute cor pulmonale: Secondary | ICD-10-CM

## 2011-10-30 DIAGNOSIS — D6851 Activated protein C resistance: Secondary | ICD-10-CM

## 2011-10-30 DIAGNOSIS — Z7901 Long term (current) use of anticoagulants: Secondary | ICD-10-CM

## 2011-10-30 LAB — PROTIME-INR

## 2011-10-30 NOTE — Progress Notes (Unsigned)
Patient was recently hospitalized for ruptured varicose veins 1/13 - 10/11/11. Patient received warfarin 5mg  daily during hospitalization. Date                             INR                Warfarin dose 10/07/11                       2.76                        5mg  10/08/11                         --                           5mg  10/09/11                       3.32                        5mg  10/10/11                       2.45                        5mg  10/11/11                       2.39                        5mg  1/128/13                     1.93                        ? Warfarin dose may have changed to 6mg  daily on 10/22/11 visit to ED. Per patient report she has been taking the 6mg  daily (the blue tablet) from an old bottle.  She cannot remember when this started. I called her [facility] pharmacy. The have not dispensed the 6mg  tablets since 04/18/11. She refilled the 5mg  tablets on 05/25/11, 07/10/11, 08/10/11 and 09/12/11.  Pt has been very stable on warfarin 5mg  daily since 04/2011 with INR ranging from 1.9 -3.4 over the last 6 months. Pt  took 6mg  this am. Will continue 5mg  daily starting on 10/31/11. Pt plans to pick up the warfarin 5mg  tablets from her pharmacy today. Pt is being seen by an [facility] RN who has also been helping with pt with her medications. Will attempt to contact RN to assist with organizing Warfarin in pt pill box for the next week. Pt stated clear understanding of plan.  She follows up with Dr. Candis Musa on 11/01/11 regarding her stitches.

## 2011-10-30 NOTE — Patient Instructions (Signed)
Continue 5mg  daily. (Take 1 peach colored tablet daily). Start this on 10/31/11. Recheck INR in 1 week on 11/06/11 at 10 am.

## 2011-10-31 ENCOUNTER — Other Ambulatory Visit: Payer: Self-pay | Admitting: Pharmacist

## 2011-10-31 DIAGNOSIS — I2699 Other pulmonary embolism without acute cor pulmonale: Secondary | ICD-10-CM

## 2011-10-31 MED ORDER — WARFARIN SODIUM 5 MG PO TABS: 5.0000 mg | ORAL_TABLET | Freq: Every day | ORAL | Status: DC

## 2011-10-31 NOTE — Telephone Encounter (Signed)
Called [facility] on [address] [phone]. Refilled Warfarin 5mg  tablets. Disp #30 tablets. Pt takes 5mg  (1 tablet) daily. Refills = 3 Dr. Cyndie Chime. Office phone number = [phone]

## 2011-11-05 ENCOUNTER — Other Ambulatory Visit: Payer: Self-pay | Admitting: Pharmacist

## 2011-11-05 DIAGNOSIS — I2699 Other pulmonary embolism without acute cor pulmonale: Secondary | ICD-10-CM

## 2011-11-06 ENCOUNTER — Other Ambulatory Visit: Payer: Medicare Other | Admitting: Lab

## 2011-11-06 ENCOUNTER — Ambulatory Visit: Payer: Medicare Other

## 2011-11-07 ENCOUNTER — Other Ambulatory Visit: Payer: Self-pay | Admitting: Pharmacist

## 2011-11-07 ENCOUNTER — Ambulatory Visit (HOSPITAL_BASED_OUTPATIENT_CLINIC_OR_DEPARTMENT_OTHER): Payer: Medicare Other | Admitting: Pharmacist

## 2011-11-07 ENCOUNTER — Other Ambulatory Visit: Payer: Medicare Other | Admitting: Lab

## 2011-11-07 DIAGNOSIS — I2699 Other pulmonary embolism without acute cor pulmonale: Secondary | ICD-10-CM

## 2011-11-07 DIAGNOSIS — D6859 Other primary thrombophilia: Secondary | ICD-10-CM

## 2011-11-07 DIAGNOSIS — Z7901 Long term (current) use of anticoagulants: Secondary | ICD-10-CM

## 2011-11-07 DIAGNOSIS — D6851 Activated protein C resistance: Secondary | ICD-10-CM

## 2011-11-07 LAB — PROTIME-INR
INR: 2.1 (ref 2.00–3.50)
Protime: 25.2 s — ABNORMAL HIGH (ref 10.6–13.4)

## 2011-11-07 NOTE — Progress Notes (Signed)
Pt main concern today is that she worries that her varicose veins may rupture again.  She wants to come off Coumadin or take her Coumadin every other day.  I explained the importance of her staying on Coumadin & reassured her that her lab (INR) looks great on 5 mg/day.  Her INR was low recently because she explained to me that at the time, she was only taking Coumadin every other day. She is the most alert I have seen her in a while.  She is awaiting neurology appt for evaluation of RLS.  She is considering stopping Mirapex because she thinks it is causing diarrhea.  She takes Neurontin for RLS also. Now that Janely is back on her Coumadin 5 mg/day, we have her stable.  We'll continue this dose. Recheck in 2 weeks. I did a complete med review w/ her today & refilled her pill box. Marily Lente, Pharm.D.

## 2011-11-11 ENCOUNTER — Encounter (HOSPITAL_COMMUNITY): Payer: Self-pay | Admitting: Emergency Medicine

## 2011-11-11 ENCOUNTER — Emergency Department (HOSPITAL_COMMUNITY)
Admission: EM | Admit: 2011-11-11 | Discharge: 2011-11-12 | Disposition: A | Discharge status: Home / Self Care | Payer: Medicare Other | Attending: Emergency Medicine | Admitting: Emergency Medicine

## 2011-11-11 DIAGNOSIS — G8929 Other chronic pain: Secondary | ICD-10-CM | POA: Insufficient documentation

## 2011-11-11 DIAGNOSIS — J45909 Unspecified asthma, uncomplicated: Secondary | ICD-10-CM | POA: Insufficient documentation

## 2011-11-11 DIAGNOSIS — M545 Low back pain, unspecified: Secondary | ICD-10-CM | POA: Insufficient documentation

## 2011-11-11 DIAGNOSIS — E785 Hyperlipidemia, unspecified: Secondary | ICD-10-CM | POA: Insufficient documentation

## 2011-11-11 DIAGNOSIS — R209 Unspecified disturbances of skin sensation: Secondary | ICD-10-CM | POA: Insufficient documentation

## 2011-11-11 DIAGNOSIS — G4730 Sleep apnea, unspecified: Secondary | ICD-10-CM | POA: Insufficient documentation

## 2011-11-11 DIAGNOSIS — K219 Gastro-esophageal reflux disease without esophagitis: Secondary | ICD-10-CM | POA: Insufficient documentation

## 2011-11-11 DIAGNOSIS — M79609 Pain in unspecified limb: Secondary | ICD-10-CM | POA: Insufficient documentation

## 2011-11-11 DIAGNOSIS — F209 Schizophrenia, unspecified: Secondary | ICD-10-CM | POA: Insufficient documentation

## 2011-11-11 DIAGNOSIS — M25559 Pain in unspecified hip: Secondary | ICD-10-CM | POA: Insufficient documentation

## 2011-11-11 DIAGNOSIS — I10 Essential (primary) hypertension: Secondary | ICD-10-CM | POA: Insufficient documentation

## 2011-11-11 MED ORDER — OXYCODONE-ACETAMINOPHEN 5-325 MG PO TABS
1.0000 | ORAL_TABLET | Freq: Once | ORAL | Status: AC
Start: 2011-11-11 — End: 2011-11-11
  Administered 2011-11-11: 1 via ORAL
  Filled 2011-11-11: qty 1

## 2011-11-11 MED ORDER — OXYCODONE-ACETAMINOPHEN 5-325 MG PO TABS: 1.0000 | ORAL_TABLET | ORAL | Status: AC | PRN

## 2011-11-11 NOTE — ED Provider Notes (Signed)
History     CSN: [id number]  Arrival date & time 11/11/11  2127   First MD Initiated Contact with Patient 11/11/11 2230      Chief Complaint  Patient presents with  . 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  Patient is a 71 y.o. female presenting with back pain. The history is provided by the patient. No language interpreter was used.  Back Pain  This is a recurrent problem. The current episode started 3 to 5 hours ago. The problem occurs constantly. The problem has not changed since onset.The pain is associated with lifting heavy objects. The pain is present in the lumbar spine. The quality of the pain is described as shooting. The pain radiates to the left thigh and right thigh. The pain is at a severity of 6/10. The pain is moderate. The symptoms are aggravated by twisting and bending. The pain is the same all the time. Stiffness is present all day. Associated symptoms include leg pain and tingling. Pertinent negatives include no chest pain, no fever, no numbness, no weight loss, no headaches, no abdominal pain, no abdominal swelling, no bowel incontinence, no perianal numbness, no bladder incontinence, no dysuria, no pelvic pain, no paresthesias, no paresis and no weakness. She has tried nothing for the symptoms. The treatment provided no relief. Risk factors include obesity and lack of exercise.    Past Medical History  Diagnosis Date  . Depression   .  Schizophrenia   . Arthritis   . Hyperlipidemia   . Hypertension   . Chronic pain   . 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 DVT (deep venous thrombosis)     per 01/17/10 d/c summary- "remote hx of dvt"?  . Iron deficiency anemia   . Chronic headaches   . Sleep apnea   . Glaucoma   . Hemorrhoids   . Asthma   . Anxiety   . GERD (gastroesophageal reflux disease)   . Anxiety     Past Surgical History  Procedure Date  . Spinal fusion     2004  . Joint replacement right knee 11/2008  . Tubal ligation year 30  . Carpal tunnel release     bilateral     Family History  Problem Relation Age of Onset  . Diabetes Sister   . Colon cancer Brother 40    History  Substance Use Topics  . Smoking status: Never Smoker   . Smokeless tobacco: Never Used  . Alcohol Use: No    OB History    Grav Para Term Preterm Abortions TAB SAB Ect Mult Living                  Review of Systems  Constitutional: Negative for fever and weight loss.  Cardiovascular: Negative for chest pain.  Gastrointestinal: Negative for abdominal pain and bowel incontinence.  Genitourinary: Negative for bladder incontinence, dysuria and pelvic pain.  Musculoskeletal: Positive for back pain.  Neurological: Positive for tingling. Negative for weakness, numbness, headaches and paresthesias.  All other systems reviewed and are negative.    Allergies  Adhesive  Home Medications   Current Outpatient Rx  Name Route Sig Dispense Refill  . ALBUTEROL SULFATE HFA 108 (90 BASE) MCG/ACT IN AERS Inhalation Inhale 2 puffs into the lungs every 4 (four) hours as needed. For shortness of breath or wheezing.     . ATENOLOL 100 MG PO TABS Oral Take 100 mg by mouth daily.    Marland Kitchen ESOMEPRAZOLE MAGNESIUM 40 MG PO CPDR Oral Take 40 mg by mouth daily.    Marland Kitchen FERROUS SULFATE 325 (65 FE) MG PO TABS  TAKE 1 TABLET BY MOUTH TWICE DAILY WITH A MEAL 60 tablet 2  . FLUOXETINE HCL 20 MG PO CAPS Oral Take 60 mg by  mouth daily.    Marland Kitchen FLUTICASONE PROPIONATE  HFA 220 MCG/ACT IN AERO Inhalation Inhale 1 puff into the lungs 2 (two) times daily as needed. Cough/shortness of breath    . GABAPENTIN 100 MG PO CAPS Oral Take 200 mg by mouth at bedtime.     Marland Kitchen HYDROCHLOROTHIAZIDE 25 MG PO TABS Oral Take 25 mg by mouth daily. For high blood pressure     . MECLIZINE HCL 25 MG PO TABS Oral Take 25 mg by mouth every 8 (eight) hours as needed.    Marland Kitchen POTASSIUM CHLORIDE CRYS ER 20 MEQ PO TBCR Oral Take 20 mEq by mouth daily.      . TRAMADOL HCL 50 MG PO TABS Oral Take 50 mg by mouth 2 (two) times daily as needed. For pain    . TRIAMCINOLONE ACETONIDE 0.1 % EX CREA Topical Apply 1 application topically Three times a day.    Marland Kitchen VITAMIN D (ERGOCALCIFEROL) 50000 UNITS PO CAPS Oral Take 50,000 Units by mouth every 7 (seven) days. Sundays    . WARFARIN SODIUM 5 MG PO TABS Oral Take 5 mg by mouth daily.      BP 129/73  Pulse 71  Resp 16  SpO2 97%  Physical Exam  Nursing note and vitals reviewed. Constitutional: She is oriented to person, place, and time. She appears well-developed and well-nourished. No distress.  HENT:  Head: Normocephalic and atraumatic.  Right Ear: External ear normal.  Left Ear: External ear normal.  Nose: Nose normal.  Mouth/Throat: Oropharynx is clear and moist. No oropharyngeal exudate.  Eyes: Conjunctivae are normal. Pupils are equal, round, and reactive to light. No scleral icterus.  Neck: Normal range of motion. Neck supple.  Cardiovascular: Normal rate, regular rhythm and normal heart sounds.  Exam reveals no gallop and no friction rub.   No murmur heard. Pulmonary/Chest: Effort normal and breath sounds normal. No respiratory distress. She exhibits no tenderness.  Abdominal: Soft. Bowel sounds are normal. She exhibits no distension. There is no tenderness.  Musculoskeletal:       Lumbar back: She exhibits tenderness, bony tenderness and pain. She exhibits normal range of motion.    Lymphadenopathy:    She has no cervical adenopathy.  Neurological: She is alert and oriented to person, place, and time. No cranial nerve deficit. She exhibits normal muscle tone. Coordination normal.  Skin: Skin is warm and dry. No rash noted. No erythema. No pallor.  Psychiatric: She has a normal mood and affect. Her behavior is normal. Judgment and thought content normal.    ED Course  Procedures (including critical care time)  Labs Reviewed - No data to display No results found.  Chronic lower back pain   MDM  Patient with a history of chronic lower back pain presents with acute exacerbation not relieved with home medications - there are no alarming signs and I do not suspect cauda equina or cord compression - will give short course of percocet and she is to follow up with Dr. Ginette Otto.        Izola Price [place], [place] 11/11/11 2358

## 2011-11-11 NOTE — Discharge Instructions (Signed)
Chronic Back Pain When back pain lasts longer than 3 months, it is called chronic back pain.This pain can be frustrating, but the cause of the pain is rarely dangerous.People with chronic back pain often go through certain periods that are more intense (flare-ups). CAUSES Chronic back pain can be caused by wear and tear (degeneration) on different structures in your back. These structures may include bones, ligaments, or discs. This degeneration may result in more pressure being placed on the nerves that travel to your legs and feet. This can lead to pain traveling from the low back down the back of the legs. When pain lasts longer than 3 months, it is not unusual for people to experience anxiety or depression. Anxiety and depression can also contribute to low back pain. TREATMENT  Establish a regular exercise plan. This is critical to improving your functional level.   Have a self-management plan for when you flare-up. Flare-ups rarely require a medical visit. Regular exercise will help reduce the intensity and frequency of your flare-ups.   Manage how you feel about your back pain and the rest of your life. Anxiety, depression, and feeling that you cannot alter your back pain have been shown to make back pain more intense and debilitating.   Medicines should never be your only treatment. They should be used along with other treatments to help you return to a more active lifestyle.   Procedures such as injections or surgery may be helpful but are rarely necessary. You may be able to get the same results with physical therapy or chiropractic care.  HOME CARE INSTRUCTIONS  Avoid bending, heavy lifting, prolonged sitting, and activities which make the problem worse.   Continue normal activity as much as possible.   Take brief periods of rest throughout the day to reduce your pain during flare-ups.   Follow your back exercise rehabilitation program. This can help reduce symptoms and prevent  more pain.   Only take over-the-counter or prescription medicines as directed by your caregiver. Muscle relaxants are sometimes prescribed. Narcotic pain medicine is discouraged for long-term pain, since addiction is a possible outcome.   If you smoke, quit.   Eat healthy foods and maintain a recommended body weight.  SEEK IMMEDIATE MEDICAL CARE IF:   You have weakness or numbness in one of your legs or feet.   You have trouble controlling your bladder or bowels.   You develop nausea, vomiting, abdominal pain, shortness of breath, or fainting.  Document Released: 10/18/2004 Document Revised: 05/23/2011 Document Reviewed: 01/29/2011 ExitCare Patient Information 2012 [employer].Chronic Pain Management Managing chronic pain is not easy. The goal is to provide as much pain relief as possible. There are emotional as well as physical problems. Chronic pain may lead to symptoms of depression which magnify those of the pain. Problems may include:  Anxiety.   Sleep disturbances.   Confused thinking.   Feeling cranky.   Fatigue.   Weight gain or loss.  Identify the source of the pain first, if possible. The pain may be masking another problem. Try to find a pain management specialist or clinic. Work with a team to create a treatment plan for you. MEDICATIONS  May include narcotics or opioids. Larger than normal doses may be needed to control your pain.   Drugs for depression may help.   Over-the-counter medicines may help for some conditions. These drugs may be used along with others for better pain relief.   May be injected into sites such as the spine   and joints. Injections may have to be repeated if they wear off.  THERAPY MAY INCLUDE:  Working with a physical therapist to keep from getting stiff.   Regular, gentle exercise.   Cognitive or behavioral therapy.   Using complementary or integrative medicine such as:   Acupuncture.   Massage, Reiki, or Rolfing.    Aroma, color, light, or sound therapy.   Group support.  FOR MORE INFORMATION http://www.painfoundation.org. American Chronic Pain Association http://www.thealpa.org. Document Released: 10/18/2004 Document Revised: 05/23/2011 Document Reviewed: 11/27/2007 ExitCare Patient Information 2012 [employer]. 

## 2011-11-11 NOTE — ED Notes (Signed)
Unable to get pt temp. Pt was eating ice upon arrival

## 2011-11-11 NOTE — ED Notes (Signed)
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 Rates pain 10/10 no other complaints.

## 2011-11-11 NOTE — ED Notes (Signed)
C/o generalized back pain that radiates to bilateral legs since Tuesday.

## 2011-11-13 NOTE — ED Provider Notes (Signed)
Medical screening examination/treatment/procedure(s) were performed by non-physician practitioner and as supervising physician I was immediately available for consultation/collaboration.   Deziyah Arvin L Xandria Gallaga, MD 11/13/11 1433 

## 2011-11-22 ENCOUNTER — Ambulatory Visit (HOSPITAL_BASED_OUTPATIENT_CLINIC_OR_DEPARTMENT_OTHER): Payer: Medicare Other | Admitting: Pharmacist

## 2011-11-22 ENCOUNTER — Other Ambulatory Visit: Payer: Medicare Other | Admitting: Lab

## 2011-11-22 DIAGNOSIS — D6851 Activated protein C resistance: Secondary | ICD-10-CM

## 2011-11-22 DIAGNOSIS — Z7901 Long term (current) use of anticoagulants: Secondary | ICD-10-CM

## 2011-11-22 DIAGNOSIS — D6859 Other primary thrombophilia: Secondary | ICD-10-CM

## 2011-11-22 DIAGNOSIS — I2699 Other pulmonary embolism without acute cor pulmonale: Secondary | ICD-10-CM

## 2011-11-22 LAB — PROTIME-INR
INR: 2.4 (ref 2.00–3.50)
Protime: 28.8 Seconds — ABNORMAL HIGH (ref 10.6–13.4)

## 2011-11-22 NOTE — Patient Instructions (Signed)
Continue with 5 mg daily

## 2011-11-30 ENCOUNTER — Encounter (HOSPITAL_COMMUNITY): Payer: Self-pay | Admitting: Emergency Medicine

## 2011-11-30 ENCOUNTER — Emergency Department (HOSPITAL_COMMUNITY)
Admission: EM | Admit: 2011-11-30 | Discharge: 2011-11-30 | Disposition: A | Discharge status: Home / Self Care | Payer: Medicare Other | Attending: Emergency Medicine | Admitting: Emergency Medicine

## 2011-11-30 DIAGNOSIS — Z7901 Long term (current) use of anticoagulants: Secondary | ICD-10-CM | POA: Insufficient documentation

## 2011-11-30 DIAGNOSIS — M549 Dorsalgia, unspecified: Secondary | ICD-10-CM | POA: Insufficient documentation

## 2011-11-30 DIAGNOSIS — Z86718 Personal history of other venous thrombosis and embolism: Secondary | ICD-10-CM | POA: Insufficient documentation

## 2011-11-30 DIAGNOSIS — I10 Essential (primary) hypertension: Secondary | ICD-10-CM | POA: Insufficient documentation

## 2011-11-30 DIAGNOSIS — E785 Hyperlipidemia, unspecified: Secondary | ICD-10-CM | POA: Insufficient documentation

## 2011-11-30 LAB — POCT I-STAT, CHEM 8
BUN: 12 mg/dL (ref 6–23)
Calcium, Ion: 1.19 mmol/L (ref 1.12–1.32)
Creatinine, Ser: 0.8 mg/dL (ref 0.50–1.10)
Glucose, Bld: 147 mg/dL — ABNORMAL HIGH (ref 70–99)
HCT: 28 % — ABNORMAL LOW (ref 36.0–46.0)
Hemoglobin: 9.5 g/dL — ABNORMAL LOW (ref 12.0–15.0)
Sodium: 145 mEq/L (ref 135–145)
TCO2: 25 mmol/L (ref 0–100)

## 2011-11-30 MED ORDER — OXYCODONE-ACETAMINOPHEN 5-325 MG PO TABS: 1.0000 | ORAL_TABLET | Freq: Four times a day (QID) | ORAL | Status: AC | PRN

## 2011-11-30 MED ORDER — HYDROMORPHONE HCL PF 1 MG/ML IJ SOLN
1.0000 mg | Freq: Once | INTRAMUSCULAR | Status: AC
  Administered 2011-11-30: 1 mg via INTRAMUSCULAR
  Filled 2011-11-30: qty 1

## 2011-11-30 NOTE — ED Notes (Signed)
Per EMS pt states she had a spinal fusion 10 years ago  While moving into a new house she irritated her back and has been hurting for about a week now  Pain is in the lower back  VS stable

## 2011-11-30 NOTE — Discharge Instructions (Signed)
Back Pain, Adult Low back pain is very common. About 1 in 5 people have back pain.The cause of low back pain is rarely dangerous. The pain often gets better over time.About half of people with a sudden onset of back pain feel better in just 2 weeks. About 8 in 10 people feel better by 6 weeks.  CAUSES Some common causes of back pain include:  Strain of the muscles or ligaments supporting the spine.   Wear and tear (degeneration) of the spinal discs.   Arthritis.   Direct injury to the back.  DIAGNOSIS Most of the time, the direct cause of low back pain is not known.However, back pain can be treated effectively even when the exact cause of the pain is unknown.Answering your caregiver's questions about your overall health and symptoms is one of the most accurate ways to make sure the cause of your pain is not dangerous. If your caregiver needs more information, he or she may order lab work or imaging tests (X-rays or MRIs).However, even if imaging tests show changes in your back, this usually does not require surgery. HOME CARE INSTRUCTIONS For many people, back pain returns.Since low back pain is rarely dangerous, it is often a condition that people can learn to manageon their own.   Remain active. It is stressful on the back to sit or stand in one place. Do not sit, drive, or stand in one place for more than 30 minutes at a time. Take short walks on level surfaces as soon as pain allows.Try to increase the length of time you walk each day.   Do not stay in bed.Resting more than 1 or 2 days can delay your recovery.   Do not avoid exercise or work.Your body is made to move.It is not dangerous to be active, even though your back may hurt.Your back will likely heal faster if you return to being active before your pain is gone.   Pay attention to your body when you bend and lift. Many people have less discomfortwhen lifting if they bend their knees, keep the load close to their  bodies,and avoid twisting. Often, the most comfortable positions are those that put less stress on your recovering back.   Find a comfortable position to sleep. Use a firm mattress and lie on your side with your knees slightly bent. If you lie on your back, put a pillow under your knees.   Only take over-the-counter or prescription medicines as directed by your caregiver. Over-the-counter medicines to reduce pain and inflammation are often the most helpful.Your caregiver may prescribe muscle relaxant drugs.These medicines help dull your pain so you can more quickly return to your normal activities and healthy exercise.   Put ice on the injured area.   Put ice in a plastic bag.   Place a towel between your skin and the bag.   Leave the ice on for 15 to 20 minutes, 3 to 4 times a day for the first 2 to 3 days. After that, ice and heat may be alternated to reduce pain and spasms.   Ask your caregiver about trying back exercises and gentle massage. This may be of some benefit.   Avoid feeling anxious or stressed.Stress increases muscle tension and can worsen back pain.It is important to recognize when you are anxious or stressed and learn ways to manage it.Exercise is a great option.  SEEK MEDICAL CARE IF:  You have pain that is not relieved with rest or medicine.   You have   pain that does not improve in 1 week.   You have new symptoms.   You are generally not feeling well.  SEEK IMMEDIATE MEDICAL CARE IF:   You have pain that radiates from your back into your legs.   You develop new bowel or bladder control problems.   You have unusual weakness or numbness in your arms or legs.   You develop nausea or vomiting.   You develop abdominal pain.   You feel faint.  Document Released: 09/10/2005 Document Revised: 08/30/2011 Document Reviewed: 01/29/2011 [employer] Patient Information 2012 [employer]. 

## 2011-11-30 NOTE — ED Notes (Signed)
MD at bedside. 

## 2011-11-30 NOTE — ED Provider Notes (Signed)
History     CSN: [id number]  Arrival date & time 11/30/11  0110   First MD Initiated Contact with Patient 11/30/11 0225      Chief Complaint  Patient presents with  . Back Pain    (Consider location/radiation/quality/duration/timing/severity/associated sxs/prior treatment) Patient is a 71 y.o. female presenting with back pain. The history is provided by the patient.  Back Pain  This is a chronic problem. Pertinent negatives include no chest pain, no numbness, no headaches, no abdominal pain and no weakness.   acute on chronic back pain. She states she moved last weekend and it too much with her back. No relief with her tramadol. No numbness or weakness. She's had exacerbations of her pain like this before. No urinary symptoms. No loss of bladder or bowel control. No trauma, possible overuse. She states that she has an appointment to see her primary care Dr. at 10 this morning.  Past Medical History  Diagnosis Date  . Depression   . Schizophrenia   . Arthritis   . Hyperlipidemia   . Hypertension   . Chronic pain   . 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 DVT (deep venous thrombosis)     per 01/17/10 d/c summary- "remote hx of dvt"?  . Iron deficiency anemia   . Chronic headaches   . Sleep apnea   . Glaucoma   . Hemorrhoids   . Asthma   . Anxiety   . GERD (gastroesophageal reflux disease)   . Anxiety     Past Surgical History  Procedure Date  . Spinal fusion     2004  . Joint replacement right knee 11/2008  . Tubal ligation year 20  . Carpal tunnel release     bilateral     Family History  Problem Relation Age of Onset  . Diabetes Sister   . Colon cancer Brother 40    History  Substance Use Topics  . Smoking status: Never Smoker   . Smokeless tobacco: Never Used  . Alcohol Use: No    OB History    Grav Para Term Preterm Abortions TAB SAB Ect Mult Living                  Review of Systems  Constitutional: Negative for  activity change.  Respiratory: Negative for chest tightness and shortness of breath.   Cardiovascular: Negative for chest pain and leg swelling.  Gastrointestinal: Negative for nausea, vomiting, abdominal pain and diarrhea.  Genitourinary: Negative for flank pain.  Musculoskeletal: Positive for back pain.  Skin: Negative for rash.  Neurological: Negative for weakness, numbness and headaches.  Psychiatric/Behavioral: Negative for behavioral problems.    Allergies  Adhesive  Home Medications   Current Outpatient Rx  Name Route Sig Dispense Refill  . ALBUTEROL SULFATE HFA 108 (90 BASE) MCG/ACT IN AERS Inhalation Inhale 2 puffs into the lungs every 4 (four) hours as needed. For shortness of breath or wheezing.     . ATENOLOL 100 MG PO TABS Oral Take 100 mg by mouth daily.    Marland Kitchen CLONAZEPAM 0.5 MG PO TABS Oral Take 0.5 mg by mouth 2 (two) times daily as needed. Anxiety    . ESOMEPRAZOLE MAGNESIUM 40 MG PO CPDR Oral Take 40 mg by mouth daily.    Marland Kitchen FERROUS SULFATE 325 (65 FE) MG PO TABS  TAKE 1 TABLET BY MOUTH TWICE DAILY WITH A MEAL 60 tablet 2  . FLUOXETINE HCL 20 MG PO CAPS  Oral Take 60 mg by mouth daily.    Marland Kitchen FLUTICASONE PROPIONATE  HFA 220 MCG/ACT IN AERO Inhalation Inhale 1 puff into the lungs 2 (two) times daily as needed. Cough/shortness of breath    . GABAPENTIN 100 MG PO CAPS Oral Take 200 mg by mouth at bedtime.     Marland Kitchen HYDROCHLOROTHIAZIDE 25 MG PO TABS Oral Take 25 mg by mouth daily. For high blood pressure     . MECLIZINE HCL 25 MG PO TABS Oral Take 25 mg by mouth every 8 (eight) hours as needed.    Marland Kitchen POTASSIUM CHLORIDE CRYS ER 20 MEQ PO TBCR Oral Take 20 mEq by mouth daily.      . TRAMADOL HCL 50 MG PO TABS Oral Take 50 mg by mouth 2 (two) times daily as needed. For pain    . TRIAMCINOLONE ACETONIDE 0.1 % EX CREA Topical Apply 1 application topically Three times a day.    Marland Kitchen VITAMIN D (ERGOCALCIFEROL) 50000 UNITS PO CAPS Oral Take 50,000 Units by mouth every 7 (seven) days. Sundays     . WARFARIN SODIUM 5 MG PO TABS Oral Take 5 mg by mouth daily.    . OXYCODONE-ACETAMINOPHEN 5-325 MG PO TABS Oral Take 1-2 tablets by mouth every 6 (six) hours as needed for pain. 6 tablet 0    BP 137/67  Pulse 61  Temp(Src) 98.3 F (36.8 C) (Oral)  Resp 20  SpO2 96%  Physical Exam  Constitutional: She appears well-developed.  HENT:  Head: Normocephalic.  Eyes: Pupils are equal, round, and reactive to light.  Cardiovascular: Normal rate and regular rhythm.   Pulmonary/Chest: Effort normal and breath sounds normal.  Abdominal: Soft. She exhibits no distension. There is no tenderness.  Musculoskeletal: Normal range of motion.       Mild lumbar tenderness without step-off or deformity  Neurological: She is alert.  Skin: Skin is warm. No rash noted.    ED Course  Procedures (including critical care time)  Labs Reviewed - No data to display No results found.   1. Back pain       MDM  Acute on chronic back pain. Possible overuse. She'll get a short course of stronger pain medicines and will follow with her primary care doctor this morning        Juliet Rude. Rubin Payor, MD 11/30/11 [phone]

## 2011-12-04 ENCOUNTER — Other Ambulatory Visit: Payer: Self-pay | Admitting: Internal Medicine

## 2011-12-04 DIAGNOSIS — M79605 Pain in left leg: Secondary | ICD-10-CM

## 2011-12-05 ENCOUNTER — Ambulatory Visit
Admission: RE | Admit: 2011-12-05 | Discharge: 2011-12-05 | Disposition: A | Discharge status: Home / Self Care | Payer: Medicare Other | Source: Ambulatory Visit | Attending: Internal Medicine | Admitting: Internal Medicine

## 2011-12-05 DIAGNOSIS — M79605 Pain in left leg: Secondary | ICD-10-CM

## 2011-12-11 ENCOUNTER — Inpatient Hospital Stay (HOSPITAL_COMMUNITY)
Admission: EM | Admit: 2011-12-11 | Discharge: 2011-12-13 | DRG: 378 | Disposition: A | Discharge status: Home / Self Care | Payer: Medicare Other | Attending: Internal Medicine | Admitting: Internal Medicine

## 2011-12-11 ENCOUNTER — Encounter (HOSPITAL_COMMUNITY): Payer: Self-pay

## 2011-12-11 DIAGNOSIS — J45909 Unspecified asthma, uncomplicated: Secondary | ICD-10-CM | POA: Diagnosis present

## 2011-12-11 DIAGNOSIS — G8929 Other chronic pain: Secondary | ICD-10-CM

## 2011-12-11 DIAGNOSIS — D6851 Activated protein C resistance: Secondary | ICD-10-CM

## 2011-12-11 DIAGNOSIS — I498 Other specified cardiac arrhythmias: Secondary | ICD-10-CM | POA: Diagnosis present

## 2011-12-11 DIAGNOSIS — E785 Hyperlipidemia, unspecified: Secondary | ICD-10-CM

## 2011-12-11 DIAGNOSIS — F323 Major depressive disorder, single episode, severe with psychotic features: Secondary | ICD-10-CM | POA: Diagnosis present

## 2011-12-11 DIAGNOSIS — D509 Iron deficiency anemia, unspecified: Secondary | ICD-10-CM

## 2011-12-11 DIAGNOSIS — Z800 Family history of malignant neoplasm of digestive organs: Secondary | ICD-10-CM

## 2011-12-11 DIAGNOSIS — D62 Acute posthemorrhagic anemia: Secondary | ICD-10-CM

## 2011-12-11 DIAGNOSIS — R748 Abnormal levels of other serum enzymes: Secondary | ICD-10-CM

## 2011-12-11 DIAGNOSIS — R55 Syncope and collapse: Secondary | ICD-10-CM | POA: Diagnosis present

## 2011-12-11 DIAGNOSIS — R001 Bradycardia, unspecified: Secondary | ICD-10-CM | POA: Diagnosis present

## 2011-12-11 DIAGNOSIS — F3289 Other specified depressive episodes: Secondary | ICD-10-CM

## 2011-12-11 DIAGNOSIS — F209 Schizophrenia, unspecified: Secondary | ICD-10-CM | POA: Diagnosis present

## 2011-12-11 DIAGNOSIS — D649 Anemia, unspecified: Secondary | ICD-10-CM

## 2011-12-11 DIAGNOSIS — K922 Gastrointestinal hemorrhage, unspecified: Secondary | ICD-10-CM

## 2011-12-11 DIAGNOSIS — K625 Hemorrhage of anus and rectum: Secondary | ICD-10-CM | POA: Diagnosis present

## 2011-12-11 DIAGNOSIS — E669 Obesity, unspecified: Secondary | ICD-10-CM

## 2011-12-11 DIAGNOSIS — L299 Pruritus, unspecified: Secondary | ICD-10-CM

## 2011-12-11 DIAGNOSIS — F329 Major depressive disorder, single episode, unspecified: Secondary | ICD-10-CM

## 2011-12-11 DIAGNOSIS — R911 Solitary pulmonary nodule: Secondary | ICD-10-CM

## 2011-12-11 DIAGNOSIS — I2699 Other pulmonary embolism without acute cor pulmonale: Secondary | ICD-10-CM

## 2011-12-11 DIAGNOSIS — I82509 Chronic embolism and thrombosis of unspecified deep veins of unspecified lower extremity: Secondary | ICD-10-CM | POA: Diagnosis present

## 2011-12-11 DIAGNOSIS — G894 Chronic pain syndrome: Secondary | ICD-10-CM | POA: Diagnosis present

## 2011-12-11 DIAGNOSIS — K5731 Diverticulosis of large intestine without perforation or abscess with bleeding: Principal | ICD-10-CM | POA: Diagnosis present

## 2011-12-11 DIAGNOSIS — I10 Essential (primary) hypertension: Secondary | ICD-10-CM | POA: Diagnosis present

## 2011-12-11 DIAGNOSIS — Z7901 Long term (current) use of anticoagulants: Secondary | ICD-10-CM

## 2011-12-11 DIAGNOSIS — K219 Gastro-esophageal reflux disease without esophagitis: Secondary | ICD-10-CM | POA: Diagnosis present

## 2011-12-11 DIAGNOSIS — Z7189 Other specified counseling: Secondary | ICD-10-CM

## 2011-12-11 DIAGNOSIS — I83813 Varicose veins of bilateral lower extremities with pain: Secondary | ICD-10-CM

## 2011-12-11 LAB — COMPREHENSIVE METABOLIC PANEL
ALT: 13 U/L (ref 0–35)
AST: 23 U/L (ref 0–37)
Albumin: 4 g/dL (ref 3.5–5.2)
Alkaline Phosphatase: 87 U/L (ref 39–117)
BUN: 14 mg/dL (ref 6–23)
CO2: 29 mEq/L (ref 19–32)
Calcium: 9.5 mg/dL (ref 8.4–10.5)
Chloride: 99 mEq/L (ref 96–112)
Creatinine, Ser: 0.97 mg/dL (ref 0.50–1.10)
GFR calc Af Amer: 68 mL/min — ABNORMAL LOW (ref >90)
GFR calc non Af Amer: 59 mL/min — ABNORMAL LOW (ref >90)
Glucose, Bld: 102 mg/dL — ABNORMAL HIGH (ref 70–99)
Potassium: 3.8 mEq/L (ref 3.5–5.1)
Sodium: 138 mEq/L (ref 135–145)
Total Bilirubin: 0.2 mg/dL — ABNORMAL LOW (ref 0.3–1.2)
Total Protein: 7.9 g/dL (ref 6.0–8.3)

## 2011-12-11 LAB — PROTIME-INR
INR: 2.1 — ABNORMAL HIGH (ref 0.00–1.49)
Prothrombin Time: 23.9 seconds — ABNORMAL HIGH (ref 11.6–15.2)

## 2011-12-11 LAB — URINALYSIS, ROUTINE W REFLEX MICROSCOPIC
Bilirubin Urine: NEGATIVE
Glucose, UA: NEGATIVE mg/dL
Ketones, ur: NEGATIVE mg/dL
Leukocytes, UA: NEGATIVE
Nitrite: NEGATIVE
Protein, ur: NEGATIVE mg/dL
Specific Gravity, Urine: 1.012 (ref 1.005–1.030)
Urobilinogen, UA: 0.2 mg/dL (ref 0.0–1.0)
pH: 6 (ref 5.0–8.0)

## 2011-12-11 LAB — ACETAMINOPHEN LEVEL: Acetaminophen (Tylenol), Serum: <15 ug/mL (ref 10–30)

## 2011-12-11 LAB — DIFFERENTIAL
Basophils Absolute: 0 10*3/uL (ref 0.0–0.1)
Basophils Relative: 0 % (ref 0–1)
Neutro Abs: 5 10*3/uL (ref 1.7–7.7)
Neutrophils Relative %: 77 % (ref 43–77)

## 2011-12-11 LAB — URINE MICROSCOPIC-ADD ON

## 2011-12-11 LAB — CBC
Hemoglobin: 11.1 g/dL — ABNORMAL LOW (ref 12.0–15.0)
MCHC: 33.2 g/dL (ref 30.0–36.0)
RDW: 14.9 % (ref 11.5–15.5)
WBC: 6.6 10*3/uL (ref 4.0–10.5)

## 2011-12-11 LAB — HEMOGLOBIN AND HEMATOCRIT, BLOOD
HCT: 34.5 % — ABNORMAL LOW (ref 36.0–46.0)
Hemoglobin: 11.4 g/dL — ABNORMAL LOW (ref 12.0–15.0)

## 2011-12-11 LAB — SALICYLATE LEVEL: Salicylate Lvl: <2 mg/dL — ABNORMAL LOW (ref 2.8–20.0)

## 2011-12-11 MED ORDER — OXYCODONE-ACETAMINOPHEN 5-325 MG PO TABS
1.0000 | ORAL_TABLET | Freq: Four times a day (QID) | ORAL | Status: DC | PRN
  Administered 2011-12-11 – 2011-12-13 (×8): 2 via ORAL
  Filled 2011-12-11 (×9): qty 2

## 2011-12-11 MED ORDER — ONDANSETRON HCL 4 MG/2ML IJ SOLN: 4.0000 mg | Freq: Four times a day (QID) | INTRAMUSCULAR | Status: DC | PRN

## 2011-12-11 MED ORDER — PANTOPRAZOLE SODIUM 40 MG PO TBEC
40.0000 mg | DELAYED_RELEASE_TABLET | Freq: Every day | ORAL | Status: DC
  Administered 2011-12-11 – 2011-12-13 (×3): 40 mg via ORAL
  Filled 2011-12-11 (×4): qty 1

## 2011-12-11 MED ORDER — FLUTICASONE PROPIONATE HFA 220 MCG/ACT IN AERO
1.0000 | INHALATION_SPRAY | Freq: Two times a day (BID) | RESPIRATORY_TRACT | Status: DC
  Administered 2011-12-11 – 2011-12-13 (×4): 1 via RESPIRATORY_TRACT
  Filled 2011-12-11: qty 12

## 2011-12-11 MED ORDER — DOCUSATE SODIUM 100 MG PO CAPS
100.0000 mg | ORAL_CAPSULE | Freq: Two times a day (BID) | ORAL | Status: DC
  Administered 2011-12-11 – 2011-12-13 (×4): 100 mg via ORAL
  Filled 2011-12-11 (×8): qty 1

## 2011-12-11 MED ORDER — FLUOXETINE HCL 20 MG PO CAPS
60.0000 mg | ORAL_CAPSULE | Freq: Every day | ORAL | Status: DC
  Administered 2011-12-11 – 2011-12-13 (×3): 60 mg via ORAL
  Filled 2011-12-11 (×4): qty 3

## 2011-12-11 MED ORDER — ALBUTEROL SULFATE HFA 108 (90 BASE) MCG/ACT IN AERS
2.0000 | INHALATION_SPRAY | RESPIRATORY_TRACT | Status: DC | PRN
Start: 2011-12-11 — End: 2011-12-13

## 2011-12-11 MED ORDER — ATENOLOL 100 MG PO TABS
100.0000 mg | ORAL_TABLET | Freq: Every day | ORAL | Status: DC
  Administered 2011-12-11: 100 mg via ORAL
  Filled 2011-12-11 (×2): qty 1

## 2011-12-11 MED ORDER — ACETAMINOPHEN 650 MG RE SUPP: 650.0000 mg | Freq: Four times a day (QID) | RECTAL | Status: DC | PRN

## 2011-12-11 MED ORDER — CLONAZEPAM 0.5 MG PO TABS
0.5000 mg | ORAL_TABLET | Freq: Two times a day (BID) | ORAL | Status: DC | PRN
  Administered 2011-12-12: 0.5 mg via ORAL
  Filled 2011-12-11: qty 1

## 2011-12-11 MED ORDER — ONDANSETRON HCL 4 MG PO TABS: 4.0000 mg | ORAL_TABLET | Freq: Four times a day (QID) | ORAL | Status: DC | PRN

## 2011-12-11 MED ORDER — PNEUMOCOCCAL VAC POLYVALENT 25 MCG/0.5ML IJ INJ
0.5000 mL | INJECTION | INTRAMUSCULAR | Status: AC
  Administered 2011-12-12: 0.5 mL via INTRAMUSCULAR
  Filled 2011-12-11 (×2): qty 0.5

## 2011-12-11 MED ORDER — GABAPENTIN 100 MG PO CAPS
200.0000 mg | ORAL_CAPSULE | Freq: Every day | ORAL | Status: DC
  Administered 2011-12-11 – 2011-12-12 (×2): 200 mg via ORAL
  Filled 2011-12-11 (×6): qty 2

## 2011-12-11 MED ORDER — HYDROCORTISONE ACETATE 25 MG RE SUPP
25.0000 mg | Freq: Two times a day (BID) | RECTAL | Status: DC
  Administered 2011-12-11 – 2011-12-13 (×4): 25 mg via RECTAL
  Filled 2011-12-11 (×7): qty 1

## 2011-12-11 MED ORDER — SODIUM CHLORIDE 0.9 % IV SOLN
INTRAVENOUS | Status: DC
  Administered 2011-12-11 – 2011-12-13 (×5): via INTRAVENOUS

## 2011-12-11 MED ORDER — ACETAMINOPHEN 325 MG PO TABS: 650.0000 mg | ORAL_TABLET | Freq: Four times a day (QID) | ORAL | Status: DC | PRN

## 2011-12-11 NOTE — ED Notes (Signed)
MD at bedside.  [facility] Dr. Darnelle Catalan present to evaluate this pt for admission

## 2011-12-11 NOTE — ED Provider Notes (Signed)
Medical screening examination/treatment/procedure(s) were conducted as a shared visit with non-physician practitioner(s) and myself.  I personally evaluated the patient during the encounter  Patient is to be and dizzy when she stands up. Does have blood mixed in her stool. Patient admitted for evaluation of her gastrointestinal bleeding  [name], MD 12/11/11 1335

## 2011-12-11 NOTE — H&P (Addendum)
Hospital Admission Note Date: 12/11/2011  Patient name: [name] [name] Medical record number: [id number] Date of birth: 06/01/1941 Age: 71 y.o. Gender: female PCP: [name] German, MD, MD  Attending physician: [name] Bun Mikinzie Maciejewski, MD Emergency Contact:  Husband, [name] [name]  Code Status:  Full code  Chief Complaint: "Bleeding in my rectum, dizziness with fainting spells".  History of Present Illness: [name] [name] is an 71 y.o. female with PMH of DVT on chronic coumadin who presented to the hospital with a 24 hour history of bloody stools.  She also complains of dizziness and pre-syncopal episodes.  No loss of consciousness. No associated nausea or vomiting.  The patient also has a history of chronic abdominal pain for which she takes Percocet and Tramadol, and apparently she had run out of these, and took about 10 400 mg Ibuprofen tablets yesterday.    The ER physician did do a rectal exam and noted grossly heme positive stool and therefore the patient has been referred to the hospitalist service for further evaluation.   Past Medical History Past Medical History  Diagnosis Date  . Depression   . Schizophrenia   . Arthritis   . Hyperlipidemia   . Hypertension   . Chronic pain   . 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 DVT (deep venous thrombosis)     per 01/17/10 d/c summary- "remote hx of dvt"?  . Iron deficiency anemia   . Chronic headaches   . Sleep apnea   . Glaucoma   . Hemorrhoids   . Asthma   . Anxiety   . GERD (gastroesophageal reflux disease)   . Anxiety     Past Surgical History Past Surgical History  Procedure Date  . Spinal fusion     2004  . Joint replacement right knee 11/2008  . Tubal ligation year 19  . Carpal tunnel release     bilateral     Meds: Prior to Admission medications   Medication Sig Start Date End Date Taking? Authorizing Provider  albuterol (VENTOLIN HFA) 108 (90 BASE) MCG/ACT inhaler Inhale 2  puffs into the lungs every 4 (four) hours as needed. For shortness of breath or wheezing.    Yes Historical Provider, MD  atenolol (TENORMIN) 100 MG tablet Take 100 mg by mouth daily. 10/11/11  Yes [name] Adelina Mings, MD  clonazePAM (KLONOPIN) 0.5 MG tablet Take 0.5 mg by mouth 2 (two) times daily as needed. Anxiety   Yes Historical Provider, MD  esomeprazole (NEXIUM) 40 MG capsule Take 40 mg by mouth daily.   Yes Historical Provider, MD  ferrous sulfate 325 (65 FE) MG tablet TAKE 1 TABLET BY MOUTH TWICE DAILY WITH A MEAL 10/29/11  Yes [name] Feinstein, MD  FLUoxetine (PROZAC) 20 MG capsule Take 60 mg by mouth daily. Pt takes 3 capsules of 20 mg for 60 mg dose daily   Yes Historical Provider, MD  fluticasone (FLOVENT HFA) 220 MCG/ACT inhaler Inhale 1 puff into the lungs 2 (two) times daily as needed. Cough/shortness of breath   Yes Historical Provider, MD  gabapentin (NEURONTIN) 100 MG capsule Take 200 mg by mouth at bedtime.    Yes Historical Provider, MD  hydrochlorothiazide 25 MG tablet Take 25 mg by mouth daily. For high blood pressure    Yes Historical Provider, MD  ibuprofen (ADVIL,MOTRIN) 400 MG tablet Take 400 mg by mouth every 6 (six) hours as needed. Pain pt states she takes up ten tablets daily  Yes Historical Provider, MD  meclizine (ANTIVERT) 25 MG tablet Take 25 mg by mouth every 8 (eight) hours as needed. For vertigo   Yes Historical Provider, MD  potassium chloride SA (K-DUR,KLOR-CON) 20 MEQ tablet Take 20 mEq by mouth daily.     Yes Historical Provider, MD  traMADol (ULTRAM) 50 MG tablet Take 100 mg by mouth 2 (two) times daily as needed. For pain.   Yes Historical Provider, MD  triamcinolone (KENALOG) 0.1 % cream Apply 1 application topically Three times a day. 07/09/11  Yes Historical Provider, MD  Vitamin D, Ergocalciferol, (DRISDOL) 50000 UNITS CAPS Take 50,000 Units by mouth every 7 (seven) days. Sundays   Yes Historical Provider, MD  warfarin (COUMADIN) 5 MG tablet Take 5 mg by  mouth daily.   Yes Historical Provider, MD  oxyCODONE-acetaminophen (PERCOCET) 5-325 MG per tablet Take 1-2 tablets by mouth every 6 (six) hours as needed for pain. 11/30/11 12/10/11  [name] Rude. Rubin Payor, MD    Allergies: Adhesive  Social History: History   Social History  . Marital Status: Married    Spouse Name: N/A    Number of Children: N/A  . Years of Education: N/A   Occupational History  . 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 Smoking status: Never Smoker   . Smokeless tobacco: Never Used  . Alcohol Use: No  . Drug Use: No  . Sexually Active: Yes    Birth Control/ Protection: Post-menopausal   Other Topics Concern  . Not on file   Social History Narrative   Lives with her husband, [name] [name], and her son, [name] [name].  Has 2 other children.  [name] [name] is mentally handicapped and has a severe seizure disorder, with PEG in place. 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    Family History:  Family History  Problem Relation Age of Onset  . Diabetes Sister   . Colon cancer Brother 40    Review of Systems: Constitutional: No fever or chills;  Appetite normal; No weight loss.  HEENT: No blurry vision or diplopia, no pharyngitis or dysphagia; Wears glasses, h/o "dry eye". CV: + right sided breast and chest pain, + subjective palpitations, no arrhythmia.  Resp: +SOB, + non-productive cough. GI: No N/V, + recent intermittant diarrhea, no melena, +hematochezia.  GU: No dysuria or hematuria.  MSK: + diffuse myalgias/arthralgias, h/o fibromyalgia.  Neuro:  + headache, no focal neurological deficits.  Psych: No depression or anxiety, h/o schizophrenia.  Endo: No thyroid disease or DM, + hot flashes and cold intolerance.  Skin: No rashes or lesions.  Heme: No anemia or blood dyscrasia   Physical Exam: Blood pressure 153/57, pulse 57, temperature 98.4 F (36.9 C), temperature source Oral, resp. rate 16, SpO2 99.00%. BP 153/57  Pulse 57  Temp(Src) 98.4 F (36.9 C) (Oral)  Resp  16  SpO2 99%  General Appearance:    Alert, cooperative, no distress, appears stated age  Head:    Normocephalic, without obvious abnormality, atraumatic  Eyes:    PERRL, conjunctiva/corneas clear, EOM's intact  Ears:    Normal external ear canals, both ears  Nose:   Nares normal, septum midline, mucosa normal, no drainage    or sinus tenderness  Throat:   Lips, mucosa, and tongue normal; edentulous with upper and lower dentures   Neck:   Supple, symmetrical, trachea midline, no adenopathy;    thyroid:  no enlargement/tenderness/nodules; no carotid   bruit or JVD  Back:  Symmetric, no curvature, ROM normal, no CVA tenderness  Lungs:     Clear to auscultation bilaterally, respirations unlabored  Chest Wall:    No tenderness or deformity   Heart:    Regular rate and rhythm, S1 and S2 normal, no murmur, rub   or gallop  Abdomen:     Soft, non-tender, bowel sounds active all four quadrants,    no masses, no organomegaly  Rectal:    done by the ER physician with reportedly heme positive stool   Extremities:   Extremities normal, atraumatic, no cyanosis or edema  Pulses:   1+ and symmetric all extremities  Skin:   Skin color, texture, turgor normal, no rashes or lesions  Lymph nodes:   Cervical, supraclavicular, and axillary nodes normal  Neurologic:   CNII-XII grossly intact, nonfocal    Lab results: Basic Metabolic Panel:  Lab 12/11/11 1610  NA 138  K 3.8  CL 99  CO2 29  GLUCOSE 102*  BUN 14  CREATININE 0.97  CALCIUM 9.5  MG --  PHOS --   GFR The CrCl is unknown because both a height and weight (above a minimum accepted value) are required for this calculation. Liver Function Tests:  Lab 12/11/11 1220  AST 23  ALT 13  ALKPHOS 87  BILITOT 0.2*  PROT 7.9  ALBUMIN 4.0   Coagulation profile  Lab 12/11/11 1220  INR 2.10*  PROTIME --    CBC:  Lab 12/11/11 1220  WBC 6.6  NEUTROABS 5.0  HGB 11.1*  HCT 33.4*  MCV 94.1  PLT 267    Imaging results:  No  results found.  Assessment & Plan: Principal Problem:  *Rectal bleeding Patient's rectal bleeding appears to be mild and it is in the setting of chronic anticoagulation and NSAID use. She has a history of hemorrhoids in the past and therefore we will empirically put her on [facility] for empiric treatment of a possible hemorrhoid flare. We'll keep her on bowel rest overnight. We'll check a hemoglobin at 9 PM and again in the morning to ensure that her hemoglobin is stable. She has been admitted under observation status and the plan will be to discharge her tomorrow if her hemoglobin is stable and there is no signs suggestive of a brisk GI bleed. Coumadin has been placed on hold. Active Problems:  Pre--syncope Likely related to rectal bleeding but she has a history of dizziness and takes Antivert as needed for this. We'll hold Antivert and check orthostatics. Hold hydrochlorothiazide. Will gently hydrate.  SCHIZOPHRENIA The patient is not currently on any antipsychotic medications and is not exhibiting any signs of psychosis.  DEPRESSION Continue Prozac. Continue when necessary Klonopin.  Other chronic pain The patient appears to be taking more medication than is actually prescribed and running out of her medicines early. She will need to be evaluated for prescription drug misuse versus under treatment of chronic pain. At this point, would try to treat her with limited narcotics. We'll use Percocet, one to 2 tabs every 6 hours as needed for moderate pain, and Tylenol as needed for mild pain. Avoid NSAIDs. Continue Neurontin for adjunctive therapy.  Essential hypertension, benign We'll hold the patient's diuretic therapy until it is ascertained that she does not have a brisk GI bleed. 10 continue Tenormin as blood pressure tolerates.  ASTHMA, PERSISTENT The patient's lung exam is stable with no active bronchospasm. Continue Flovent twice a day, but changed to routine use as this is not a when  necessary  medication as she has been taking it. We'll continue albuterol for breakthrough wheezing. The patient will need to be counseled regarding the appropriate use of her inhalers, specifically, maintenance versus breakthrough use of her medications.  GERD Will continue PPI therapy.  Chronic anticoagulation Will hold anticoagulation until we have further information about the stability of her hemoglobin. At this point, there is no evidence that she has a brisk GI bleed, so I will not reverse her Coumadin , but would have a low threshold to do so.  Prophylaxis: PAS hoses for DVT prophylaxis.  Time Spent On Admission: One hour.  Gordie Belvin 12/11/2011, 3:11 PM Pager [phone]

## 2011-12-11 NOTE — ED Notes (Addendum)
Pt c/o BRB from rectum x28months, pain to lower abdomen. Pt denies n/v/d. Pt also c/o back pain.

## 2011-12-11 NOTE — ED Notes (Signed)
Pt with HR of 44-54. Called md to inform. Pt non symptomatic. No new orders per MD will continue to monitor.

## 2011-12-11 NOTE — ED Notes (Signed)
MD at bedside. EDP Isaias Cowman

## 2011-12-11 NOTE — ED Provider Notes (Signed)
History     CSN: [id number]  Arrival date & time 12/11/11  1027   First MD Initiated Contact with Patient 12/11/11 1042      Chief Complaint  Patient presents with  . GI Bleeding    x48months    (Consider location/radiation/quality/duration/timing/severity/associated sxs/prior treatment) HPI [name] [name] is a 71 yo female with a history of schizophrenia and internal hemorrhoids per pt history, who presents to the ED today for "rectal bleeding."  She states this has been occurring for the past 3 months, but last night she took 10 tablets of 400mg  ibuprofen at one time, and when she awoke this morning, she noticed she was bleeding from her rectum, which filled the toilet bowl and was bright red on the toilet paper when she wiped.  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  She also admits to taking medication which she has been warned interact with the Coumadin, so she has noticed her blood being thinner and bleeding more in her legs.  She states also that since she has been taking the extra medication she has been "blacking out" daily, with no injuries or falls and she is having auditory and visual hallucinations which she tries to shut out.  She has no intention of hurting herself, she is just tired of the pain.  She has not seen a psychiatrist in a year and is only taking Prozac for her schizophrenia.  She denies any fever, chills, N/V, chest pain, shortness of breath, abdominal pain, mucous tinged diarrhea.  Pts brother died of colon cancer and she has been scared to get a follow-up colonoscopy from 10 years about when there was a polyp discovered, because she is scared that she may have cancer as well.    Past Medical History  Diagnosis Date  . Depression   . Schizophrenia   . Arthritis   . Hyperlipidemia   . Hypertension   . Chronic pain   .  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 DVT (deep venous thrombosis)     per 01/17/10 d/c summary- "remote hx of dvt"?  . Iron deficiency anemia   . Chronic headaches   . Sleep apnea   . Glaucoma   . Hemorrhoids   . Asthma   . Anxiety   . GERD (gastroesophageal reflux disease)   . Anxiety     Past Surgical History  Procedure Date  . Spinal fusion     2004  . Joint replacement right knee 11/2008  . Tubal ligation year 68  . Carpal tunnel release     bilateral     Family History  Problem Relation Age of Onset  . Diabetes Sister   . Colon cancer Brother 40    History  Substance Use Topics  . Smoking status: Never Smoker   . Smokeless tobacco: Never Used  . Alcohol Use: No    OB History    Grav Para Term Preterm Abortions TAB SAB Ect Mult Living                  Review of Systems All pertinent positives and negatives reviewed in the history of present illness  Allergies  Adhesive  Home Medications   Current Outpatient Rx  Name Route Sig Dispense Refill  . ALBUTEROL SULFATE HFA 108 (90  BASE) MCG/ACT IN AERS Inhalation Inhale 2 puffs into the lungs every 4 (four) hours as needed. For shortness of breath or wheezing.     . ATENOLOL 100 MG PO TABS Oral Take 100 mg by mouth daily.    Marland Kitchen CLONAZEPAM 0.5 MG PO TABS Oral Take 0.5 mg by mouth 2 (two) times daily as needed. Anxiety    . ESOMEPRAZOLE MAGNESIUM 40 MG PO CPDR Oral Take 40 mg by mouth daily.    Marland Kitchen FERROUS SULFATE 325 (65 FE) MG PO TABS  TAKE 1 TABLET BY MOUTH TWICE DAILY WITH A MEAL 60 tablet 2  . FLUOXETINE HCL 20 MG PO CAPS Oral Take 60 mg by mouth daily. Pt takes 3 capsules of 20 mg for 60 mg dose daily    . FLUTICASONE PROPIONATE  HFA 220 MCG/ACT IN AERO Inhalation Inhale 1 puff into the lungs 2 (two) times daily as needed. Cough/shortness of breath    . GABAPENTIN 100 MG PO CAPS Oral Take 200 mg by mouth at bedtime.     Marland Kitchen HYDROCHLOROTHIAZIDE 25 MG PO TABS Oral Take 25 mg by mouth  daily. For high blood pressure     . IBUPROFEN 400 MG PO TABS Oral Take 400 mg by mouth every 6 (six) hours as needed. Pain pt states she takes up ten tablets daily    . MECLIZINE HCL 25 MG PO TABS Oral Take 25 mg by mouth every 8 (eight) hours as needed. For vertigo    . POTASSIUM CHLORIDE CRYS ER 20 MEQ PO TBCR Oral Take 20 mEq by mouth daily.      . TRAMADOL HCL 50 MG PO TABS Oral Take 100 mg by mouth 2 (two) times daily as needed. For pain.    . TRIAMCINOLONE ACETONIDE 0.1 % EX CREA Topical Apply 1 application topically Three times a day.    Marland Kitchen VITAMIN D (ERGOCALCIFEROL) 50000 UNITS PO CAPS Oral Take 50,000 Units by mouth every 7 (seven) days. Sundays    . WARFARIN SODIUM 5 MG PO TABS Oral Take 5 mg by mouth daily.    . OXYCODONE-ACETAMINOPHEN 5-325 MG PO TABS Oral Take 1-2 tablets by mouth every 6 (six) hours as needed for pain. 6 tablet 0    BP 161/87  Pulse 60  Temp 98.2 F (36.8 C)  Resp 18  SpO2 100%  Physical Exam  Constitutional: She is oriented to person, place, and time. She appears well-developed and well-nourished. No distress.  HENT:  Head: Normocephalic and atraumatic.  Eyes: Pupils are equal, round, and reactive to light.  Neck: Normal range of motion.  Cardiovascular: Normal rate, regular rhythm, normal heart sounds and intact distal pulses.   No murmur heard. Pulmonary/Chest: Effort normal and breath sounds normal. No respiratory distress. She has no wheezes.  Abdominal: Soft. Bowel sounds are normal. She exhibits no distension. There is no tenderness.  Neurological: She is alert and oriented to person, place, and time.  Skin: Skin is warm. No rash noted. She is not diaphoretic. No erythema.    ED Course  Procedures (including critical care time)    Patient will be admitted to the hospital for further eval.   MDM  MDM Reviewed: nursing note and vitals Interpretation: labs Consults: admitting MD            Carlyle Dolly, PA-C 12/11/11  1435

## 2011-12-12 ENCOUNTER — Encounter (HOSPITAL_COMMUNITY): Payer: Self-pay | Admitting: Nurse Practitioner

## 2011-12-12 DIAGNOSIS — Z7901 Long term (current) use of anticoagulants: Secondary | ICD-10-CM

## 2011-12-12 DIAGNOSIS — R001 Bradycardia, unspecified: Secondary | ICD-10-CM | POA: Diagnosis present

## 2011-12-12 DIAGNOSIS — K922 Gastrointestinal hemorrhage, unspecified: Secondary | ICD-10-CM

## 2011-12-12 DIAGNOSIS — I498 Other specified cardiac arrhythmias: Secondary | ICD-10-CM

## 2011-12-12 LAB — CBC
MCV: 93.7 fL (ref 78.0–100.0)
Platelets: 250 10*3/uL (ref 150–400)
Platelets: 257 10*3/uL (ref 150–400)
RBC: 3.49 MIL/uL — ABNORMAL LOW (ref 3.87–5.11)
RBC: 3.65 MIL/uL — ABNORMAL LOW (ref 3.87–5.11)
RDW: 14.8 % (ref 11.5–15.5)
RDW: 14.6 % (ref 11.5–15.5)
WBC: 5.3 10*3/uL (ref 4.0–10.5)
WBC: 5.5 10*3/uL (ref 4.0–10.5)

## 2011-12-12 LAB — TSH: TSH: 0.566 u[IU]/mL (ref 0.350–4.500)

## 2011-12-12 MED ORDER — ENOXAPARIN SODIUM 120 MG/0.8ML ~~LOC~~ SOLN
110.0000 mg | Freq: Two times a day (BID) | SUBCUTANEOUS | Status: DC
  Administered 2011-12-12 – 2011-12-13 (×3): 110 mg via SUBCUTANEOUS
  Filled 2011-12-12 (×6): qty 0.8

## 2011-12-12 NOTE — ED Notes (Addendum)
Pt HR is sustaining in the low 40's and occasionally drops to the upper 30's while pt is just sitting at rest talking to staff. Pt is symptomatic at this time and has no complaints. Pt states that she has not had any issues with a low HR before in the past. Pt does have a cardiologist but states that she can't remember his name but that it is across from [facility] hospital with the shape of the heart emblem as its logo. MD has been made aware.

## 2011-12-12 NOTE — Progress Notes (Signed)
ANTICOAGULATION CONSULT NOTE - Initial Consult  Pharmacy Consult for Lovenox Indication: Coumadin bridge  Allergies  Allergen Reactions  . Adhesive (Tape) Rash and Other (See Comments)    Pulls skin off    Patient Measurements: Height: 5\' 6"  (167.6 cm) Weight: 246 lb 7.6 oz (111.8 kg) IBW/kg (Calculated) : 59.3   Vital Signs: Temp: 98 F (36.7 C) (03/20 1030) Temp src: Oral (03/20 1030) BP: 159/85 mmHg (03/20 1030) Pulse Rate: 49  (03/20 1030)  Labs:  Basename 12/12/11 0825 12/12/11 0500 12/11/11 2250 12/11/11 1220  HGB 11.2* 10.8* -- --  HCT 34.2* 32.7* 34.5* --  PLT 257 250 -- 267  APTT -- -- -- --  LABPROT -- -- -- 23.9*  INR -- -- -- 2.10*  HEPARINUNFRC -- -- -- --  CREATININE -- -- -- 0.97  CKTOTAL -- -- -- --  CKMB -- -- -- --  TROPONINI -- -- -- --   Estimated Creatinine Clearance: 70.4 ml/min (by C-G formula based on Cr of 0.97).  Medical History: Past Medical History  Diagnosis Date  . Depression   . Schizophrenia   . Arthritis   . Hyperlipidemia   . Hypertension   . Chronic pain   . 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 DVT (deep venous thrombosis)     per 01/17/10 d/c summary- "remote hx of dvt"?  . Iron deficiency anemia   . Chronic headaches   . Sleep apnea   . Glaucoma   . Hemorrhoids   . Asthma   . Anxiety   . GERD (gastroesophageal reflux disease)     Medications:  Scheduled:    . docusate sodium  100 mg Oral BID  . FLUoxetine  60 mg Oral Daily  . fluticasone  1 puff Inhalation BID  . gabapentin  200 mg Oral QHS  . hydrocortisone  25 mg Rectal BID  . pantoprazole  40 mg Oral Daily  . pneumococcal 23 valent vaccine  0.5 mL Intramuscular Tomorrow-1000  . DISCONTD: atenolol  100 mg Oral Daily    Assessment:  71 yo F admit on chronic warfarin for history of VTE; warfarin currently on hold for admission with rectal bleeding; Stool fecal occult blood positive  CBC stable  INR 2.1 as of 3/19  Renal  function appears within normal limits  Goal of Therapy:  Lovenox per renal function   Plan:  Lovenox 110mg  SQ Q12 hours   Lynann Beaver PharmD, BCPS Pager [phone] 12/12/2011 2:41 PM

## 2011-12-12 NOTE — ED Provider Notes (Signed)
Medical screening examination/treatment/procedure(s) were conducted as a shared visit with non-physician practitioner(s) and myself.  I personally evaluated the patient during the encounter  [name], MD 12/12/11 [phone]

## 2011-12-12 NOTE — Progress Notes (Signed)
CARDIOLOGY CONSULT NOTE  Patient ID: [name] [name] MRN: [id number] DOB/AGE: 1941/05/14 71 y.o.  Admit date: 12/11/2011 Primary Physician  Dorrene German, MD Primary Cardiologist  Chief Complaint  Bradycardia  HPI:   The patient presents with GI bleeding. She is being evaluated and plans are for an outpatient colonoscopy. She has been on Coumadin because of previous pulmonary emboli and DVT. Workup this admission has included venous Dopplers which were negative. She's currently off of her warfarin and on Lovenox pending outpatient workup.   We're consulted because of bradycardia.  She reports some past cardiac evaluation she doesn't recall the name of her physician. She apparently had palpitations sounds like this is when she was started on atenolol. She's not had sinus rhythm and sinus bradycardia. Rates are as low as the high 30s. She does seem to have a chronotropic response when moving around in her room however. She's had no sustained pauses. She's had no symptoms related to this. In particular while in-house she has not had presyncope or syncope. Of note she does get this history of multiple syncopal episodes. She says this has been happening with dizziness for 50 years. She says she has episodes of weakness weekly. She does not describe syncope with trauma. She does not describe orthostatic symptoms. She did some fleeting chest discomfort. This has been happening for 4 years and has an unchanged pattern. She denies any PND or orthopnea. She will occasionally notice palpitations. Of note she was on 100 mg of atenolol this admission receive her last dose last night.  She does have sleep apnea. However, she does not wear CPAP because she doesn't sleep through the night. She takes care of her disabled son who often has nighttime seizures. She sometimes wears his CPAP during the day.  Past Medical History  Diagnosis Date  . Depression   . Schizophrenia   . Arthritis   . Hyperlipidemia   .  Hypertension   . Chronic pain   . 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 DVT (deep venous thrombosis)     per 01/17/10 d/c summary- "remote hx of dvt"?  . Iron deficiency anemia   . Chronic headaches   . Sleep apnea   . Glaucoma   . Hemorrhoids   . Asthma   . Anxiety   . GERD (gastroesophageal reflux disease)     Past Surgical History  Procedure Date  . Spinal fusion     2004  . Joint replacement right knee 11/2008  . Tubal ligation year 60  . Carpal tunnel release     bilateral     Allergies  Allergen Reactions  . Adhesive (Tape) Rash and Other (See Comments)    Pulls skin off   Prescriptions prior to admission  Medication Sig Dispense Refill  . albuterol (VENTOLIN HFA) 108 (90 BASE) MCG/ACT inhaler Inhale 2 puffs into the lungs every 4 (four) hours as needed. For shortness of breath or wheezing.       Marland Kitchen atenolol (TENORMIN) 100 MG tablet Take 100 mg by mouth daily.      . clonazePAM (KLONOPIN) 0.5 MG tablet Take 0.5 mg by mouth 2 (two) times daily as needed. Anxiety      . esomeprazole (NEXIUM) 40 MG capsule Take 40 mg by mouth daily.      . ferrous sulfate 325 (65 FE) MG tablet TAKE 1 TABLET BY MOUTH TWICE DAILY WITH A MEAL  60 tablet  2  . FLUoxetine (  PROZAC) 20 MG capsule Take 60 mg by mouth daily. Pt takes 3 capsules of 20 mg for 60 mg dose daily      . fluticasone (FLOVENT HFA) 220 MCG/ACT inhaler Inhale 1 puff into the lungs 2 (two) times daily as needed. Cough/shortness of breath      . gabapentin (NEURONTIN) 100 MG capsule Take 200 mg by mouth at bedtime.       . hydrochlorothiazide 25 MG tablet Take 25 mg by mouth daily. For high blood pressure       . ibuprofen (ADVIL,MOTRIN) 400 MG tablet Take 400 mg by mouth every 6 (six) hours as needed. Pain pt states she takes up ten tablets daily      . meclizine (ANTIVERT) 25 MG tablet Take 25 mg by mouth every 8 (eight) hours as needed. For vertigo      . potassium chloride SA (K-DUR,KLOR-CON) 20 MEQ  tablet Take 20 mEq by mouth daily.        . traMADol (ULTRAM) 50 MG tablet Take 100 mg by mouth 2 (two) times daily as needed. For pain.      Marland Kitchen triamcinolone (KENALOG) 0.1 % cream Apply 1 application topically Three times a day.      . Vitamin D, Ergocalciferol, (DRISDOL) 50000 UNITS CAPS Take 50,000 Units by mouth every 7 (seven) days. Sundays      . warfarin (COUMADIN) 5 MG tablet Take 5 mg by mouth daily.       Family History  Problem Relation Age of Onset  . Diabetes Sister   . Colon cancer Brother 33    History   Social History  . Marital Status: Married    Spouse Name: N/A    Number of Children: N/A  . Years of Education: N/A   Occupational History  . 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 Smoking status: Never Smoker   . Smokeless tobacco: Never Used  . Alcohol Use: No  . Drug Use: No  . Sexually Active: Yes    Birth Control/ Protection: Post-menopausal   Other Topics Concern  . Not on file   Social History Narrative   Lives with her husband, [name], and her son, [name].  Has 2 other children.  [name] is mentally handicapped and has a severe seizure disorder, with PEG in place. 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    ROS: As stated in the HPI and negative for all other systems.  Physical Exam: Blood pressure 135/76, pulse 66, temperature 98.7 F (37.1 C), temperature source Oral, resp. rate 18, height 5\' 6"  (1.676 m), weight 246 lb 7.6 oz (111.8 kg), SpO2 99.00%.  GENERAL:  Well appearing HEENT:  Pupils equal round and reactive, fundi not visualized, oral mucosa unremarkable, dentures NECK:  No jugular venous distention, waveform within normal limits, carotid upstroke brisk and symmetric, no bruits, no thyromegaly LYMPHATICS:  No cervical, inguinal adenopathy LUNGS:  Clear to auscultation bilaterally BACK:  No CVA tenderness CHEST:  Unremarkable HEART:  PMI not displaced or sustained,S1 and S2 within normal limits, no S3, no S4, no clicks, no  rubs, apical systolic murmur radiating slightly out the aortic outflow tract. ABD:  Flat, positive bowel sounds normal in frequency in pitch, no bruits, no rebound, no guarding, no midline pulsatile mass, no hepatomegaly, no splenomegaly EXT:  2 plus pulses throughout, no edema, no cyanosis no clubbing, varicose veins SKIN:  No rashes no nodules NEURO:  Cranial nerves II through  XII grossly intact, motor grossly intact throughout PSYCH:  Cognitively intact, oriented to person place and time   Labs: Lab Results  Component Value Date   BUN 14 12/11/2011   Lab Results  Component Value Date   CREATININE 0.97 12/11/2011   Lab Results  Component Value Date   NA 138 12/11/2011   K 3.8 12/11/2011   CL 99 12/11/2011   CO2 29 12/11/2011   Lab Results  Component Value Date   CKTOTAL 455* 09/02/2011   CKMB 4.3* 04/22/2011   TROPONINI <0.30 04/22/2011   Lab Results  Component Value Date   WBC 5.5 12/12/2011   HGB 11.2* 12/12/2011   HCT 34.2* 12/12/2011   MCV 93.7 12/12/2011   PLT 257 12/12/2011   Lab Results  Component Value Date   CHOL 244* 09/26/2009   HDL 84 09/26/2009   LDLCALC 010* 09/26/2009   LDLDIRECT 140* 08/28/2010   TRIG 89 09/26/2009   CHOLHDL 2.9 Ratio 09/26/2009   Lab Results  Component Value Date   ALT 13 12/11/2011   AST 23 12/11/2011   ALKPHOS 87 12/11/2011   BILITOT 0.2* 12/11/2011    EKG:Not done this admission  ASSESSMENT AND PLAN:   1)  Bradycardia:  I agree with discontinuing the atenolol. She'll need outpatient monitoring to make sure she has chronotropic. She does have a slight systolic murmur which likely represents aortic sclerosis. If she has not had an outpatient echocardiogram in the recent past this to be repeated as an outpatient. Further management of her bradycardia will be based on outpatient monitoring. At this point I have no suggestion that this rhythm is related to her dizziness or previous syncope. Of note her bradycardia is probably related unfortunately to  the sleep apnea which is not adequately treated with her social situation.  I will order TSH. Given her dizziness I will also check orthostatic blood pressures.  2)  HTN:  Her blood pressure will need to be followed closely off of the atenolol. May adjustments can be based on future readings.  3)  Palpitations:  She believes that her palpitations have been related to stress and she has mechanisms to relieve this. If these recur consideration would need to be given to further management of awaiting high-dose negative chronotropic agents.  4)  GI Bleed:  Per the primary team.  Signed: Rollene Rotunda 12/12/2011, 4:15 PM

## 2011-12-12 NOTE — Progress Notes (Signed)
Patient ID: [name] [name], female   DOB: Jul 08, 1941, 71 y.o.   MRN: [id number]  Assessment & Plan:   Principal Problem:   *Rectal bleeding  - per GI recommendations will bridge to Lovenox and will likely arrange for colonoscopy to be done in an outpatient setting - will obtain additional CBC in AM and if stable will proceed with outpatient work up of rectal bleeding - hold coumadin for now and bridge to lovenox  Active Problems:   Pre--syncope  - potentially related to bradycardia - will obtain cardiology consult and will follow up on recommendations - hold Atenolol  SCHIZOPHRENIA  - not exhibiting any signs of psychosis.   DEPRESSION  - Continue Prozac. Continue when necessary Klonopin.   Essential hypertension, benign  - hold atenolol  ASTHMA, PERSISTENT  - stable  GERD  - Will continue PPI therapy.   Prophylaxis:  PAS hoses for DVT prophylaxis.  EDUCATION - test results and diagnostic studies were discussed with patient  - patient verbalized the understanding - questions were answered at the bedside and contact information was provided for additional questions or concerns  Subjective: No events overnight. Patient denies chest pain, shortness of breath, abdominal pain.   Objective:  Vital signs in last 24 hours:   12/12/11 0458 12/12/11 1030  BP: 139/68 159/85  Pulse: 52 49  Temp: 98.4 F (36.9 C) 98 F (36.7 C)  TempSrc: Oral Oral  Resp: 18 18  Weight:  111.8 kg (246 lb 7.6 oz)  SpO2: 96% 99%   Physical Exam: General: Alert, awake, oriented x3, in no acute distress. HEENT: No bruits, no goiter. Moist mucous membranes, no scleral icterus, no conjunctival pallor. Heart: Regular rate and rhythm, S1/S2 +, no murmurs, rubs, gallops. Lungs: Clear to auscultation bilaterally. No wheezing, no rhonchi, no rales.  Abdomen: Soft, nontender, nondistended, positive bowel sounds. Extremities: No clubbing or cyanosis, no pitting edema,  positive pedal  pulses. Neuro: Grossly nonfocal.  Lab Results:  Lab 12/12/11 0825 12/12/11 0500 12/11/11 1220  WBC 5.5 5.3 6.6  HGB 11.2* 10.8* 11.1*  HCT 34.2* 32.7* 33.4*  PLT 257 250 267  MCV 93.7 93.7 94.1    Lab 12/11/11 1220  NA 138  K 3.8  CL 99  CO2 29  GLUCOSE 102*  BUN 14  CREATININE 0.97  CALCIUM 9.5    Lab 12/11/11 1220  INR 2.10*  PROTIME --    Medications: Scheduled Meds:   . atenolol  100 mg Oral Daily  . docusate sodium  100 mg Oral BID  . FLUoxetine  60 mg Oral Daily  . fluticasone  1 puff Inhalation BID  . gabapentin  200 mg Oral QHS  . hydrocortisone  25 mg Rectal BID  . pantoprazole  40 mg Oral Daily  . pneumococcal 23 valent vaccine  0.5 mL Intramuscular Tomorrow-1000   Continuous Infusions:   . sodium chloride 100 mL/hr at 12/12/11 1133   PRN Meds:.acetaminophen, acetaminophen, albuterol, clonazePAM, ondansetron (ZOFRAN) IV, ondansetron, oxyCODONE-acetaminophen   LOS: 1 day   [name] [name] 12/12/2011, 1:37 PM  [facility] Pager: [phone]

## 2011-12-12 NOTE — Consult Note (Signed)
[facility] Consultation  Referring Provider: [facility] Primary Care Physician:  Dorrene German, MD, MD Primary Gastroenterologist:   Claudette Head, MD Reason for Consultation:  rectal bleeding  HPI: [name] [name] is a 71 y.o. female who is on chronic coumadin for history of DVT. She has a history of chronic abdomina pain for which she ultram and Percocet (if pain is severe enough). Patient believes the lower abdominal pain, present for 15 years, is actually related to back problems.  Lately patient has been taking ibuprofen because she ran out of Ultram.   Patient is known to Dr. Russella Dar for a history of GERD and a strong FMH of colon cancer. She was evaluated in our office March 2012 for normocytic anemia  and occasional self-limited rectal bleeding with constipation. EGD and colonoscopy were recommended but patient cancelled procedure. We contacted her a couple of times to reschedule but patient declined. Patient came to ED yesterday with dizziness and rectal bleeding. Hemoglobin 10/22/11 was 9.1, it was 11.1 yesterday and 11.2 today. BUN is normal. INR 2.1. WBC normal. Blood pressure okay but she has been bradycardic with heart rate in 40's. She is on tenormin. She describes episodes of dizziness and "passing out" since being on Coumadin over last 3 years.  She thinks some of these spells are related to sleep apnea and sleep deprivation  Past Medical History  Diagnosis Date  . Depression   . Schizophrenia   . Arthritis   . Hyperlipidemia   . Hypertension   . Chronic pain   . 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 DVT (deep venous thrombosis)     per 01/17/10 d/c summary- "remote hx of dvt"?  . Iron deficiency anemia   . Chronic headaches   . Sleep apnea   . Glaucoma   . Hemorrhoids   . Asthma   . Anxiety   . GERD (gastroesophageal reflux disease)     Past Surgical History  Procedure Date  . Spinal fusion     2004  . Joint replacement  right knee 11/2008  . Tubal ligation year 69  . Carpal tunnel release     bilateral     Prior to Admission medications   Medication Sig Start Date End Date Taking? Authorizing Provider  albuterol (VENTOLIN HFA) 108 (90 BASE) MCG/ACT inhaler Inhale 2 puffs into the lungs every 4 (four) hours as needed. For shortness of breath or wheezing.    Yes Historical Provider, MD  atenolol (TENORMIN) 100 MG tablet Take 100 mg by mouth daily. 10/11/11  Yes Novlet Adelina Mings, MD  clonazePAM (KLONOPIN) 0.5 MG tablet Take 0.5 mg by mouth 2 (two) times daily as needed. Anxiety   Yes Historical Provider, MD  esomeprazole (NEXIUM) 40 MG capsule Take 40 mg by mouth daily.   Yes Historical Provider, MD  ferrous sulfate 325 (65 FE) MG tablet TAKE 1 TABLET BY MOUTH TWICE DAILY WITH A MEAL 10/29/11  Yes Levert Feinstein, MD  FLUoxetine (PROZAC) 20 MG capsule Take 60 mg by mouth daily. Pt takes 3 capsules of 20 mg for 60 mg dose daily   Yes Historical Provider, MD  fluticasone (FLOVENT HFA) 220 MCG/ACT inhaler Inhale 1 puff into the lungs 2 (two) times daily as needed. Cough/shortness of breath   Yes Historical Provider, MD  gabapentin (NEURONTIN) 100 MG capsule Take 200 mg by mouth at bedtime.    Yes Historical Provider, MD  hydrochlorothiazide 25 MG tablet Take 25  mg by mouth daily. For high blood pressure    Yes Historical Provider, MD  ibuprofen (ADVIL,MOTRIN) 400 MG tablet Take 400 mg by mouth every 6 (six) hours as needed. Pain pt states she takes up ten tablets daily   Yes Historical Provider, MD  meclizine (ANTIVERT) 25 MG tablet Take 25 mg by mouth every 8 (eight) hours as needed. For vertigo   Yes Historical Provider, MD  potassium chloride SA (K-DUR,KLOR-CON) 20 MEQ tablet Take 20 mEq by mouth daily.     Yes Historical Provider, MD  traMADol (ULTRAM) 50 MG tablet Take 100 mg by mouth 2 (two) times daily as needed. For pain.   Yes Historical Provider, MD  triamcinolone (KENALOG) 0.1 % cream Apply 1 application  topically Three times a day. 07/09/11  Yes Historical Provider, MD  Vitamin D, Ergocalciferol, (DRISDOL) 50000 UNITS CAPS Take 50,000 Units by mouth every 7 (seven) days. Sundays   Yes Historical Provider, MD  warfarin (COUMADIN) 5 MG tablet Take 5 mg by mouth daily.   Yes Historical Provider, MD    Current Facility-Administered Medications  Medication Dose Route Frequency Provider Last Rate Last Dose  . 0.9 %  sodium chloride infusion   Intravenous Continuous Maryruth Bun Rama, MD 100 mL/hr at 12/12/11 0455    . acetaminophen (TYLENOL) tablet 650 mg  650 mg Oral Q6H PRN Maryruth Bun Rama, MD       Or  . acetaminophen (TYLENOL) suppository 650 mg  650 mg Rectal Q6H PRN Christina P Rama, MD      . albuterol (PROVENTIL HFA;VENTOLIN HFA) 108 (90 BASE) MCG/ACT inhaler 2 puff  2 puff Inhalation Q4H PRN Christina P Rama, MD      . atenolol (TENORMIN) tablet 100 mg  100 mg Oral Daily Maryruth Bun Rama, MD   100 mg at 12/11/11 1746  . clonazePAM (KLONOPIN) tablet 0.5 mg  0.5 mg Oral BID PRN Maryruth Bun Rama, MD      . docusate sodium (COLACE) capsule 100 mg  100 mg Oral BID Maryruth Bun Rama, MD   100 mg at 12/12/11 0926  . FLUoxetine (PROZAC) capsule 60 mg  60 mg Oral Daily Maryruth Bun Rama, MD   60 mg at 12/12/11 0926  . fluticasone (FLOVENT HFA) 220 MCG/ACT inhaler 1 puff  1 puff Inhalation BID Maryruth Bun Rama, MD   1 puff at 12/12/11 0759  . gabapentin (NEURONTIN) capsule 200 mg  200 mg Oral QHS Maryruth Bun Rama, MD   200 mg at 12/11/11 2224  . hydrocortisone (ANUSOL-HC) suppository 25 mg  25 mg Rectal BID Maryruth Bun Rama, MD   25 mg at 12/12/11 0927  . ondansetron (ZOFRAN) tablet 4 mg  4 mg Oral Q6H PRN Christina P Rama, MD       Or  . ondansetron (ZOFRAN) injection 4 mg  4 mg Intravenous Q6H PRN Christina P Rama, MD      . oxyCODONE-acetaminophen (PERCOCET) 5-325 MG per tablet 1-2 tablet  1-2 tablet Oral Q6H PRN Maryruth Bun Rama, MD   2 tablet at 12/12/11 0636  . pantoprazole (PROTONIX) EC tablet  40 mg  40 mg Oral Daily Maryruth Bun Rama, MD   40 mg at 12/12/11 0926  . pneumococcal 23 valent vaccine (PNU-IMMUNE) injection 0.5 mL  0.5 mL Intramuscular Tomorrow-1000 Maryruth Bun Rama, MD        Allergies as of 12/11/2011 - Review Complete 12/11/2011  Allergen Reaction Noted  . Adhesive (tape) Rash and Other (See Comments)  10/22/2011    Family History  Problem Relation Age of Onset  . Diabetes Sister   . Colon cancer Brother 73    History   Social History  . Marital Status: Married    Spouse Name: N/A    Number of Children: N/A  . Years of Education: N/A   Occupational History  . 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 Smoking status: Never Smoker   . Smokeless tobacco: Never Used  . Alcohol Use: No  . Drug Use: No  . Sexually Active: Yes    Birth Control/ Protection: Post-menopausal    Social History Narrative   Lives with her husband, [name], and her son, [name].  Has 2 other children.  [name] is mentally handicapped and has a severe seizure disorder, with PEG in place. 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    Review of Systems:  PHYSICAL EXAM: Vital signs in last 24 hours: Temp:  [97.9 F (36.6 C)-98.4 F (36.9 C)] 98 F (36.7 C) (03/20 1030) Pulse Rate:  [41-57] 49  (03/20 1030) Resp:  [12-18] 18  (03/20 1030) BP: (136-179)/(57-85) 159/85 mmHg (03/20 1030) SpO2:  [94 %-99 %] 99 % (03/20 1030) Weight:  [246 lb 7.6 oz (111.8 kg)] 246 lb 7.6 oz (111.8 kg) (03/20 1030) Last BM Date: 12/10/11 General:   Pleasant black female in NAD Head:  Normocephalic and atraumatic. Eyes:   No icterus.   Conjunctiva pink. Ears:  Normal auditory acuity. Neck:  Supple; no masses felt Lungs:  Respirations even and unlabored. Lungs clear to auscultation bilaterally.   No wheezes, crackles, or rhonchi.  Heart:  Regular rate and rhythm Abdomen:  Soft, nondistended, mild LUQ tenderness. . Normal bowel sounds. No appreciable masses or hepatomegaly.  Rectal:  Skin  tags. No stool in vault..  Msk:  Symmetrical without gross deformities.  Extremities:  Without edema. Neurologic:  Alert and  oriented ;  grossly normal neurologically. Skin:  Intact without significant lesions or rashes. Cervical Nodes:  No significant cervical adenopathy. Psych:  Alert and cooperative. Normal affect.  LAB RESULTS:  Basename 12/12/11 0825 12/12/11 0500 12/11/11 2250 12/11/11 1220  WBC 5.5 5.3 -- 6.6  HGB 11.2* 10.8* 11.4* --  HCT 34.2* 32.7* 34.5* --  PLT 257 250 -- 267   BMET  Basename 12/11/11 1220  NA 138  K 3.8  CL 99  CO2 29  GLUCOSE 102*  BUN 14  CREATININE 0.97  CALCIUM 9.5   LFT  Basename 12/11/11 1220  PROT 7.9  ALBUMIN 4.0  AST 23  ALT 13  ALKPHOS 87  BILITOT 0.2*  BILIDIR --  IBILI --   PT/INR  Basename 12/11/11 1220  LABPROT 23.9*  INR 2.10*    PREVIOUS ENDOSCOPIES: sounds like she had a polyps >10 year ago  IMPRESSION / PLAN: 1. Mild normocytic anemia which may be related to #2. She needs to proceed with endoscopic evaluation with propofol as we suggested last May.  She could have endoscopy done outpatient but her episodes of "passing out" and dizziness are concerning. We may need to proceed this admission so she can be monitored during bowel prep.  2.Chronic coumadin for history of DVT, INR 2.1 3. Intermittent rectal bleeding associated with constipation. Rule out hemorrhoids, neoplasm.  4.  Constipation, she manages with diet but could benefit from Miralax.    5. GERD, continue PPI 6. Sleep apnea 7. Strong family history of colon cancer in brother. Patient  needs a colonoscopy. 8. Dizziness, episodes of "passing out" since being on Coumadin for 3 years. She is bradycardic today. Symptoms possibly secondary to slow heart rate??.    Thanks   LOS: 1 day   Willette Cluster  12/12/2011, 11:22 AM   ________________________________________________________________________  [facility] MD note:  I personally examined the  patient, reviewed the data and agree with the assessment and plan described above.  She has very minor rectal bleeding in setting of coumading (inr 2.1 today).  She has brother with CRC, sister with polyps and may have had polyps herself many years ago.  She needs colonoscopy and EGD for GI workup. That is safe from my perspective to do as outpatient however her compliance is questionable.  She is on coumadin and that will take 4-5 days to wash out.  Patient says she is ok doing this as outpatient as well but  Is concerned about her dizziness as a potential limiting factor.    Primary team to help decide whether she is to stay here in hospital (to workup dizziness, to bridge to lovenox and then plan colonoscopy +/- EGD in 4-5 days.   Given chronic narc usage and psychiatric issues, she will require anesthesia assisted sedation (realistically this will be next Tuesday if the procedure is to be done as an inpatient).  Alternatively, we can likely find a time in next 2-4 weeks as an outpatient with Dr. Russella Dar to perform colonsocopy/EGD,  can decide on a plan for her coumadin, bridging with lovenox with help of coumadin clinic and provide  Her with clear follow up instructions on prep, etc.   Rob Bunting, MD [facility] Pager [phone]

## 2011-12-13 ENCOUNTER — Encounter: Payer: Self-pay | Admitting: Nurse Practitioner

## 2011-12-13 LAB — CBC
MCH: 30.6 pg (ref 26.0–34.0)
Platelets: 224 10*3/uL (ref 150–400)
RBC: 3.4 MIL/uL — ABNORMAL LOW (ref 3.87–5.11)
WBC: 6.4 10*3/uL (ref 4.0–10.5)

## 2011-12-13 LAB — BASIC METABOLIC PANEL
BUN: 11 mg/dL (ref 6–23)
GFR calc Af Amer: >90 mL/min (ref >90)
GFR calc non Af Amer: 84 mL/min — ABNORMAL LOW (ref >90)
Potassium: 3.2 mEq/L — ABNORMAL LOW (ref 3.5–5.1)

## 2011-12-13 LAB — PROTIME-INR
INR: 2.29 — ABNORMAL HIGH (ref 0.00–1.49)
Prothrombin Time: 25.6 seconds — ABNORMAL HIGH (ref 11.6–15.2)

## 2011-12-13 MED ORDER — TRAMADOL HCL 50 MG PO TABS: 100.0000 mg | ORAL_TABLET | Freq: Two times a day (BID) | ORAL | Status: DC | PRN

## 2011-12-13 MED ORDER — WARFARIN SODIUM 5 MG PO TABS: 5.0000 mg | ORAL_TABLET | Freq: Every day | ORAL | Status: DC

## 2011-12-13 MED ORDER — ENOXAPARIN SODIUM 100 MG/ML ~~LOC~~ SOLN: 110.0000 mg | Freq: Two times a day (BID) | SUBCUTANEOUS | Status: DC

## 2011-12-13 MED ORDER — LORAZEPAM 1 MG PO TABS: 1.0000 mg | ORAL_TABLET | Freq: Three times a day (TID) | ORAL | Status: DC

## 2011-12-13 NOTE — Evaluation (Addendum)
Physical Therapy Evaluation Patient Details Name: [name] [name] MRN: [id number] DOB: 1941/03/20 Today's Date: 12/13/2011  Problem List:  Patient Active Problem List  Diagnoses  . HYPERLIPIDEMIA  . OBESITY, UNSPECIFIED  . SCHIZOPHRENIA  . DEPRESSION  . Other chronic pain  . Essential hypertension, benign  . PULMONARY EMBOLISM  . ASTHMA, PERSISTENT  . CPK, ABNORMAL  . Counseling on health promotion and disease prevention  . Itching  . Lung nodule  . Encounter for long-term (current) use of anticoagulants  . Varicose veins of both lower extremities with pain  . GERD  . Family history of malignant neoplasm of gastrointestinal tract  . Iron deficiency anemia, unspecified  . Heterozygous factor V Leiden mutation  . Anemia, normocytic normochromic  . Acute post-hemorrhagic anemia  . Hypotension  . Rectal bleeding  . Chronic anticoagulation  . Pre-syncope  . Bradycardia    Past Medical History:  Past Medical History  Diagnosis Date  . Depression   . Schizophrenia   . Arthritis   . Hyperlipidemia   . Hypertension   . Chronic pain   . 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 DVT (deep venous thrombosis)     per 01/17/10 d/c summary- "remote hx of dvt"?  . Iron deficiency anemia   . Chronic headaches   . Sleep apnea   . Glaucoma   . Hemorrhoids   . Asthma   . Anxiety   . GERD (gastroesophageal reflux disease)    Past Surgical History:  Past Surgical History  Procedure Date  . Spinal fusion     2004  . Joint replacement right knee 11/2008  . Tubal ligation year 68  . Carpal tunnel release     bilateral     PT Assessment/Plan/Recommendation PT Assessment Clinical Impression Statement: Pt presents with rectal bleeding and is scheduled for colonoscopy next week.  Pt tolerates ambulation well.  Scored 44 on BERG balance test, placing her at a significant fall risk.  Also noted pt with slight LOB with head turns while ambulating.  Pt will  benefit from skilled PT in acute venue to address balance deficits.  PT recommends no follow up therapy for pt at D/C.   PT Recommendation/Assessment: Patient will need skilled PT in the acute care venue PT Problem List: Decreased balance;Decreased coordination;Decreased safety awareness;Pain Barriers to Discharge: None PT Therapy Diagnosis : Abnormality of gait;Acute pain PT Plan PT Frequency: Min 3X/week PT Treatment/Interventions: Gait training;Functional mobility training;Therapeutic activities;Therapeutic exercise;Balance training;Patient/family education PT Recommendation Follow Up Recommendations: No PT follow up Equipment Recommended: None recommended by PT PT Goals  Acute Rehab PT Goals PT Goal Formulation: With patient Pt will perform home exercise program Independently Progress: Goal set today Additional Goals Additional Goal #1: Pt will score 49/56 on BERG balance test.  PT Goal: Additional Goal #1 - Progress: Goal set today Additional Goal #2: Pt will score 19/21 on DGI.  PT Goal: Additional Goal #2 - Progress: Goal set today  PT Evaluation Precautions/Restrictions  Precautions Required Braces or Orthoses: No Restrictions Weight Bearing Restrictions: No Prior Functioning  Home Living Lives With: Spouse;Family Receives Help From: Family Type of Home: House Home Layout: One level Home Access: Ramped entrance Home Adaptive Equipment: Straight cane;Bedside commode/3-in-1 Prior Function Level of Independence: Independent with basic ADLs;Independent with gait;Independent with transfers;Requires assistive device for independence Driving: Yes Cognition Cognition Arousal/Alertness: Awake/alert Overall Cognitive Status: Appears within functional limits for tasks assessed Sensation/Coordination Sensation Light Touch: Appears Intact Coordination Gross  Motor Movements are Fluid and Coordinated: Yes Extremity Assessment RLE Assessment RLE Assessment: Within Functional  Limits LLE Assessment LLE Assessment: Within Functional Limits Mobility (including Balance) Bed Mobility Bed Mobility: No Transfers Transfers: Yes Sit to Stand: 6: Modified independent (Device/Increase time) Stand to Sit: 6: Modified independent (Device/Increase time) Ambulation/Gait Ambulation/Gait: Yes Ambulation/Gait Assistance: 5: Supervision Ambulation/Gait Assistance Details (indicate cue type and reason): Supervision for safety.   Ambulation Distance (Feet): 150 Feet Assistive device: Other (Comment) (Used IV pole for stability. ) Gait Pattern: Within Functional Limits Gait velocity: WFL Stairs: No Wheelchair Mobility Wheelchair Mobility: No  Hospital doctor Sit to Stand: Able to stand without using hands and stabilize independently Standing Unsupported: Able to stand safely 2 minutes Sitting with Back Unsupported but Feet Supported on Floor or Stool: Able to sit safely and securely 2 minutes Stand to Sit: Sits safely with minimal use of hands Transfers: Able to transfer safely, minor use of hands Standing Unsupported with Eyes Closed: Able to stand 10 seconds with supervision Standing Ubsupported with Feet Together: Able to place feet together independently and stand for 1 minute with supervision From Standing, Reach Forward with Outstretched Arm: Can reach forward >12 cm safely (5") From Standing Position, Pick up Object from Floor: Able to pick up shoe safely and easily From Standing Position, Turn to Look Behind Over each Shoulder: Looks behind from both sides and weight shifts well Turn 360 Degrees: Able to turn 360 degrees safely but slowly Standing Unsupported, Alternately Place Feet on Step/Stool: Able to complete >2 steps/needs minimal assist Standing Unsupported, One Foot in Front: Able to take small step independently and hold 30 seconds Standing on One Leg: Able to lift leg independently and hold equal to or more than 3 seconds Total Score: 44  Exercise      End of Session PT - End of Session Equipment Utilized During Treatment: Gait belt Activity Tolerance: Patient tolerated treatment well Patient left: in chair;with call bell in reach Nurse Communication: Mobility status for transfers;Mobility status for ambulation General Behavior During Session: [facility] for tasks performed Cognition: [facility] for tasks performed  Page, Meribeth Mattes 12/13/2011, 10:36 AM

## 2011-12-13 NOTE — Progress Notes (Signed)
[facility] Progress Note  SUBJECTIVE: slept well and feels better today. Had a normal, non-bloody BM this am.   OBJECTIVE:  Vital signs in last 24 hours: Temp:  [98 F (36.7 C)-98.7 F (37.1 C)] 98.4 F (36.9 C) (03/21 0604) Pulse Rate:  [49-73] 53  (03/21 0604) Resp:  [16-18] 18  (03/21 0604) BP: (115-159)/(64-85) 120/70 mmHg (03/21 0604) SpO2:  [94 %-99 %] 97 % (03/21 0859) Weight:  [246 lb 7.6 oz (111.8 kg)] 246 lb 7.6 oz (111.8 kg) (03/20 1030) Last BM Date: 12/11/11 General:    Pleasant black female in NAD Heart:  Slightly bradycardic. Abdomen:  Soft, nontender and nondistended. Normal bowel sounds. Neurologic:  Alert and oriented,  grossly normal neurologically. Psych:  Cooperative. Normal mood and affect.  ILab Results:  Basename 12/13/11 0452 12/12/11 0825 12/12/11 0500  WBC 6.4 5.5 5.3  HGB 10.4* 11.2* 10.8*  HCT 32.0* 34.2* 32.7*  PLT 224 257 250   BMET  Basename 12/13/11 0452 12/11/11 1220  NA 137 138  K 3.2* 3.8  CL 103 99  CO2 28 29  GLUCOSE 98 102*  BUN 11 14  CREATININE 0.78 0.97  CALCIUM 8.7 9.5   LFT  Basename 12/11/11 1220  PROT 7.9  ALBUMIN 4.0  AST 23  ALT 13  ALKPHOS 87  BILITOT 0.2*  BILIDIR --  IBILI --   PT/INR  Basename 12/13/11 0452 12/11/11 1220  LABPROT 25.6* 23.9*  INR 2.29* 2.10*     ASSESSMENT / PLAN:  1. Mild normocytic anemia which may be related to #3. She needs to proceed with endoscopic evaluation with propofol as we suggested last May. She could have endoscopy done outpatient but patient concerned about dizziness, weakness and overall ability to carry out prep. We can do procedures inpatient in which case Coumadin would still need to be held. She can use Lovenox until time of endoscopy which will likely be Tuesday. Will check on patient Monday morning, if INR is down we will prep her for colonoscopy to be done on Tuesday.    2.Chronic coumadin for history of DVT, INR 2.29 today  3. Intermittent  rectal bleeding associated with constipation. Rule out hemorrhoids, neoplasm.  4. Constipation, she manages with diet but could benefit from Miralax.  5. GERD, continue PPI  6. Sleep apnea  7. Strong family history of colon cancer in brother. Patient needs a colonoscopy.  8. Dizziness, episodes of "passing out" since being on Coumadin for 3 years. Etiology? 9. Sinus brady, Tenormin stopped yesterday. Heart rate in 50's today    LOS: 2 days   Willette Cluster  12/13/2011, 9:17 AM   ________________________________________________________________________  [facility] MD note:  I personally examined the patient, reviewed the data and agree with the assessment and plan described above.  Minor rectal bleeding, FH of colon cancer.  On coumadin with INR today 2.3.  Colonoscopy can be done as outpatient or inpatient.  She prefers it to be done while she is in hosp and undertands with her INR and requirement for MAC sedation it will likely be on next Tuesday.  Please continue to hold her coumadin and bridge with lovenox. We will check back on Monday to set her up with prep and schedule for the colonoscopy with MAC on Tuesday.    Please call or page sooner if there are any changes in this inpatient plan.   Rob Bunting, MD [facility] Pager [phone]

## 2011-12-13 NOTE — Progress Notes (Signed)
Patient discharged home with husband; discharge instructions given and explained to patient/family and they verbalized understanding  And denies any pain/distress. accompanied home by husband, transported to the car by staff via wheelchair, skin intact, No wound noted. Confirmed with Dr. Izola Price if patient should continue taking coumadin at home and she stated yes for patient to continue taking coumadin until seen by GI doctor outpatient on 3/27, patient informed and verbalized understanding.

## 2011-12-13 NOTE — Discharge Instructions (Signed)
Bradycardia Bradycardia is a term for a heart rate (pulse) that, in adults, is slower than 60 beats per minute. A normal rate is 60 to 100 beats per minute. A heart rate below 60 beats per minute may be normal for some adults with healthy hearts. If the rate is too slow, the heart may have trouble pumping the volume of blood the body needs. If the heart rate gets too low, blood flow to the brain may be decreased and may make you feel lightheaded, dizzy, or faint. The heart has a natural pacemaker in the top of the heart called the SA node (sinoatrial or sinus node). This pacemaker sends out regular electrical signals to the muscle of the heart, telling the heart muscle when to beat (contract). The electrical signal travels from the upper parts of the heart (atria) through the AV node (atrioventricular node), to the lower chambers of the heart (ventricles). The ventricles squeeze, pumping the blood from your heart to your lungs and to the rest of your body. CAUSES   Problem with the heart's electrical system.   Problem with the heart's natural pacemaker.   Heart disease, damage, or infection.   Medications.   Problems with minerals and salts (electrolytes).  SYMPTOMS   Fainting (syncope).   Fatigue and weakness.   Shortness of breath (dyspnea).   Chest pain (angina).   Drowsiness.   Confusion.  DIAGNOSIS   An electrocardiogram (ECG) can help your caregiver determine the type of slow heart rate you have.   If the cause is not seen on an ECG, you may need to wear a heart monitor that records your heart rhythm for several hours or days.   Blood tests.  TREATMENT   Electrolyte supplements.   Medications.   Withholding medication which is causing a slow heart rate.   Pacemaker placement.  SEEK IMMEDIATE MEDICAL CARE IF:   You feel lightheaded or faint.   You develop an irregular heart rate.   You feel chest pain or have trouble breathing.  MAKE SURE YOU:   Understand  these instructions.   Will watch your condition.   Will get help right away if you are not doing well or get worse.  Document Released: 06/02/2002 Document Revised: 08/30/2011 Document Reviewed: 04/28/2008 [employer] Patient Information 2012 [employer]. 

## 2011-12-13 NOTE — Clinical Documentation Improvement (Signed)
Anemia Blood Loss Clarification  THIS DOCUMENT IS NOT A PERMANENT PART OF THE MEDICAL RECORD  RESPOND TO THE THIS QUERY, FOLLOW THE INSTRUCTIONS BELOW:  1. If needed, update documentation for the patient's encounter via the notes activity.  2. Access this query again and click edit on the In [employer].  3. After updating, or not, click F2 to complete all highlighted (required) fields concerning your review. Select "additional documentation in the medical record" OR "no additional documentation provided".  4. Click Sign note button.  5. The deficiency will fall out of your In Basket *Please let us know if you are not able to complete this workflow by phone or e-mail (listed below).        12/13/11  Dear Dr. Izola Price, I/Associates  In an effort to better capture your patient's severity of illness, reflect appropriate length of stay and utilization of resources, a review of the patient medical record has revealed the following indicators.    Based on your clinical judgment, please clarify and document in a progress note and/or discharge summary the clinical condition associated with the following supporting information:  In responding to this query please exercise your independent judgment.  The fact that a query is asked, does not imply that any particular answer is desired or expected.  Pt admitted with GIB.  According to lab pt's H/H= 10.4 / 32.0 in setting of GIB and h/o "Iron deficiency anemia" and "Mild normocytic anemia"    Please clarify based on abnormal H/H whether or not anemia can be further specified as one of the diagnoses listed below and document in pn or d/c summary.   Possible Clinical Conditions?   " Expected Acute Blood Loss Anemia  " Acute Blood Loss Anemia  " Acute on chronic blood loss anemia   " Other Condition________________  " Cannot Clinically Determine  Risk Factors: (recent surgery, pre op anemia, EBL in OR)  Supporting Information:  Signs  and Symptoms (unable to ambulate, weakness, dizziness, unable to participate in care)  Diagnostics: Component     Latest Ref Rng 12/11/2011 12/12/2011 12/12/2011 12/13/2011  HGB     12.0 - 15.0 g/dL 16.1 (L) 09.6 (L) 04.5 (L) 10.4 (L)  HCT     36.0 - 46.0 % 34.5 (L) 32.7 (L) 34.2 (L) 32.0 (L)   Treatments:  IV fluids  0.9 % sodium chloride infusion    Serial H&H monitoring   Reviewed: added acute blood loss anemia to d/c summary Thank You,  Enis Slipper  RN, BSN, CCDS Clinical Documentation Specialist [facility] Dept Pager: [phone] / E-mail: [email or web address]  Health Information Management [place]

## 2011-12-13 NOTE — Discharge Summary (Signed)
Patient ID: [name] [name] MRN: [id number] DOB/AGE: 69-Jan-1942 69 y.o.  Admit date: 12/11/2011 Discharge date: 12/13/2011  Primary Care Physician:  Dorrene German, MD, MD  Discharge Diagnoses:    Present on Admission:  .Rectal bleeding .Other chronic pain .ASTHMA, PERSISTENT .GERD .DEPRESSION .SCHIZOPHRENIA .Essential hypertension, benign .Pre-syncope .Bradycardia  Principal Problem:  *Rectal bleeding Active Problems:  SCHIZOPHRENIA  DEPRESSION  Other chronic pain  Essential hypertension, benign  ASTHMA, PERSISTENT  GERD  Chronic anticoagulation  Pre-syncope  Bradycardia   Medication List  As of 12/13/2011 11:32 AM   STOP taking these medications         atenolol 100 MG tablet      oxyCODONE-acetaminophen 5-325 MG per tablet         TAKE these medications         clonazePAM 0.5 MG tablet   Commonly known as: KLONOPIN   Take 0.5 mg by mouth 2 (two) times daily as needed. Anxiety      esomeprazole 40 MG capsule   Commonly known as: NEXIUM   Take 40 mg by mouth daily.      ferrous sulfate 325 (65 FE) MG tablet   TAKE 1 TABLET BY MOUTH TWICE DAILY WITH A MEAL      FLOVENT HFA 220 MCG/ACT inhaler   Generic drug: fluticasone   Inhale 1 puff into the lungs 2 (two) times daily as needed. Cough/shortness of breath      FLUoxetine 20 MG capsule   Commonly known as: PROZAC   Take 60 mg by mouth daily. Pt takes 3 capsules of 20 mg for 60 mg dose daily      gabapentin 100 MG capsule   Commonly known as: NEURONTIN   Take 200 mg by mouth at bedtime.      hydrochlorothiazide 25 MG tablet   Commonly known as: HYDRODIURIL   Take 25 mg by mouth daily. For high blood pressure      ibuprofen 400 MG tablet   Commonly known as: ADVIL,MOTRIN   Take 400 mg by mouth every 6 (six) hours as needed. Pain pt states she takes up ten tablets daily      LORazepam 1 MG tablet   Commonly known as: ATIVAN   Take 1 tablet (1 mg total) by mouth every 8 (eight) hours.     meclizine 25 MG tablet   Commonly known as: ANTIVERT   Take 25 mg by mouth every 8 (eight) hours as needed. For vertigo      potassium chloride SA 20 MEQ tablet   Commonly known as: K-DUR,KLOR-CON   Take 20 mEq by mouth daily.      traMADol 50 MG tablet   Commonly known as: ULTRAM   Take 2 tablets (100 mg total) by mouth 2 (two) times daily as needed. For pain.      triamcinolone cream 0.1 %   Commonly known as: KENALOG   Apply 1 application topically Three times a day.      VENTOLIN HFA 108 (90 BASE) MCG/ACT inhaler   Generic drug: albuterol   Inhale 2 puffs into the lungs every 4 (four) hours as needed. For shortness of breath or wheezing.      Vitamin D (Ergocalciferol) 50000 UNITS Caps   Commonly known as: DRISDOL   Take 50,000 Units by mouth every 7 (seven) days. Sundays      warfarin 5 MG tablet   Commonly known as: COUMADIN   Take 1 tablet (5 mg total) by mouth daily.  Disposition and Follow-up: Pt will need to see PCP in 2 weeks post discharge. She has also been scheduled an appointment with GI specialist for further evaluation of blood loss anemia. She will need colonoscopy in an outpatient setting.  Consults:  Cardiology for bradycardia and GI- for acute blood loss anemia  Significant Diagnostic Studies:  No results found.  Brief H and P: [name] [name] is an 71 y.o. female with PMH of DVT on chronic coumadin who presented to the hospital with a 24 hour history of bloody stools. She also complains of dizziness and pre-syncopal episodes. No loss of consciousness. No associated nausea or vomiting. The patient also has a history of chronic abdominal pain for which she takes Percocet and Tramadol, and apparently she had run out of these, and took about 10 400 mg Ibuprofen tablets yesterday. The ER physician did do a rectal exam and noted grossly heme positive stool and therefore the patient has been referred to the hospitalist service for further evaluation.     Physical Exam on Discharge:  Filed Vitals:   12/12/11 2032 12/12/11 2149 12/13/11 0604 12/13/11 0859  BP:  159/77 120/70   Pulse:  56 53   Temp:  98 F (36.7 C) 98.4 F (36.9 C)   TempSrc:  Oral Oral   Resp:  16 18   Height:      Weight:      SpO2: 94% 97% 95% 97%     Intake/Output Summary (Last 24 hours) at 12/13/11 1132 Last data filed at 12/13/11 0600  Gross per 24 hour  Intake   4710 ml  Output      0 ml  Net   4710 ml    General: Alert, awake, oriented x3, in no acute distress. HEENT: No bruits, no goiter. Heart: Regular rate and rhythm, without murmurs, rubs, gallops. Lungs: Clear to auscultation bilaterally. Abdomen: Soft, nontender, nondistended, positive bowel sounds. Extremities: No clubbing cyanosis or edema with positive pedal pulses. Neuro: Grossly intact, nonfocal.  CBC:    Component Value Date/Time   WBC 6.4 12/13/2011 0452   WBC 6.4 06/26/2011 1337   HGB 10.4* 12/13/2011 0452   HGB 11.7 06/26/2011 1337   HCT 32.0* 12/13/2011 0452   HCT 34.7* 06/26/2011 1337   PLT 224 12/13/2011 0452   PLT 220 06/26/2011 1337   MCV 94.1 12/13/2011 0452   MCV 96.4 06/26/2011 1337   NEUTROABS 5.0 12/11/2011 1220   NEUTROABS 4.4 06/26/2011 1337   LYMPHSABS 1.1 12/11/2011 1220   LYMPHSABS 1.3 06/26/2011 1337   MONOABS 0.4 12/11/2011 1220   MONOABS 0.4 06/26/2011 1337   EOSABS 0.1 12/11/2011 1220   EOSABS 0.2 06/26/2011 1337   BASOSABS 0.0 12/11/2011 1220   BASOSABS 0.0 06/26/2011 1337    Basic Metabolic Panel:    Component Value Date/Time   NA 137 12/13/2011 0452   K 3.2* 12/13/2011 0452   CL 103 12/13/2011 0452   CO2 28 12/13/2011 0452   BUN 11 12/13/2011 0452   CREATININE 0.78 12/13/2011 0452   CREATININE 0.91 11/08/2010 1518   GLUCOSE 98 12/13/2011 0452   CALCIUM 8.7 12/13/2011 0452    Hospital Course:  Principal Problem:  *Rectal bleeding - this has spontaneously resolved and was thought to be diverticular in etiology vs hemorrhoidal bleeding - pt was seen by GI  during this hospitalization and will need colonoscopy in an outpatient setting - her Hg/Hct has remained stable during the hospitalization  Active Problems:  Anemia - acute blood loss  anemia - unclear etiology and will need further work up with colonoscopy as noted above - her Hg/Hct have remained stable during the hospitalization - pt has not required transfusion   Essential hypertension, benign - stable   GERD - stable   Pre-syncope - secondary to blood loss and bradycardia - no events on telemetry noted in ED - no additional syncopal events   Bradycardia - asymptomatic and evaluated by cardiology - atenolol discontinued and pt responded well - will need to be readdressed in an outpt setting  Time spent on Discharge: Over 30 minutes  Signed: Debbora Presto 12/13/2011, 11:32 AM  [facility], pager #: [phone] Main office number: [phone]

## 2011-12-13 NOTE — Progress Notes (Signed)
Subjective:   Pt is a very pleasant BF with hx of HTN and sinus brady.  She was admitted for eval of a GI bleed ( she has hemorrhoids)   She is on coumadin for DVT and PE.   We were consulted for bradycardia.   No symptoms related to her mild sinus brady.     . docusate sodium  100 mg Oral BID  . enoxaparin (LOVENOX) injection  110 mg Subcutaneous Q12H  . FLUoxetine  60 mg Oral Daily  . fluticasone  1 puff Inhalation BID  . gabapentin  200 mg Oral QHS  . hydrocortisone  25 mg Rectal BID  . pantoprazole  40 mg Oral Daily  . pneumococcal 23 valent vaccine  0.5 mL Intramuscular Tomorrow-1000  . DISCONTD: atenolol  100 mg Oral Daily      . sodium chloride 100 mL/hr at 12/13/11 0737    Objective:  Vital Signs in the last 24 hours: Blood pressure 120/70, pulse 53, temperature 98.4 F (36.9 C), temperature source Oral, resp. rate 18, height 5\' 6"  (1.676 m), weight 246 lb 7.6 oz (111.8 kg), SpO2 95.00%. Temp:  [98 F (36.7 C)-98.7 F (37.1 C)] 98.4 F (36.9 C) (03/21 0604) Pulse Rate:  [41-73] 53  (03/21 0604) Resp:  [16-18] 18  (03/21 0604) BP: (115-159)/(64-85) 120/70 mmHg (03/21 0604) SpO2:  [94 %-99 %] 95 % (03/21 0604) Weight:  [246 lb 7.6 oz (111.8 kg)] 246 lb 7.6 oz (111.8 kg) (03/20 1030)  Intake/Output from previous day: 03/20 0701 - 03/21 0700 In: 4710 [P.O.:1080; I.V.:3630] Out: -  Intake/Output from this shift:    Physical Exam:  Physical Exam: Blood pressure 120/70, pulse 53, temperature 98.4 F (36.9 C), temperature source Oral, resp. rate 18, height 5\' 6"  (1.676 m), weight 246 lb 7.6 oz (111.8 kg), SpO2 95.00%. General: Well developed, well nourished, in no acute distress. Head: Normocephalic, atraumatic, sclera non-icteric, mucus membranes are moist,  Neck: Supple. Normal carotids. No JVD Lungs: Clear bilaterally to auscultation without wheezes, rales, or rhonchi. Breathing is unlabored. Heart: Regular rate,  With normal  S1 S2. No murmurs, rubs,  or gallops  Abdomen: Soft, non-tender, non-distended with normoactive bowel sounds. No hepatomegaly. No rebound/guarding. No abdominal masses. Obese. Msk:  Strength and tone appear normal for age. Extremities: No clubbing or cyanosis. No edema.  Distal pedal pulses are 2+ and equal bilaterally. Neuro: Alert and oriented X 3. Moves all extremities spontaneously. Psych:  Responds to questions appropriately with a normal affect.    Lab Results:   [facility] 12/13/11 0452 12/11/11 1220  NA 137 138  K 3.2* 3.8  CL 103 99  CO2 28 29  GLUCOSE 98 102*  BUN 11 14  CREATININE 0.78 0.97  CALCIUM 8.7 9.5  MG -- --  PHOS -- --    Basename 12/11/11 1220  AST 23  ALT 13  ALKPHOS 87  BILITOT 0.2*  PROT 7.9  ALBUMIN 4.0   No results found for this basename: LIPASE:2,AMYLASE:2 in the last 72 hours  Basename 12/13/11 0452 12/12/11 0825 12/11/11 1220  WBC 6.4 5.5 --  NEUTROABS -- -- 5.0  HGB 10.4* 11.2* --  HCT 32.0* 34.2* --  MCV 94.1 93.7 --  PLT 224 257 --    Basename 12/12/11 1745  TSH 0.566  T4TOTAL --  T3FREE --  THYROIDAB --    Tele:  Sinus brady.  Assessment/Plan:   1. Sinus bradycardia.  Asymptomatic.  BP is OK off atenolol.  Will sign off.  She will follow up with [facility].  She can see [place] if needed.    Vesta Mixer, Montez Hageman., MD, [facility] 12/13/2011, 8:01 AM LOS: Day 2

## 2011-12-19 ENCOUNTER — Emergency Department (HOSPITAL_COMMUNITY)
Admission: EM | Admit: 2011-12-19 | Discharge: 2011-12-19 | Disposition: A | Discharge status: Home / Self Care | Payer: Medicare Other | Attending: Emergency Medicine | Admitting: Emergency Medicine

## 2011-12-19 ENCOUNTER — Encounter (HOSPITAL_COMMUNITY): Payer: Self-pay | Admitting: *Deleted

## 2011-12-19 ENCOUNTER — Encounter: Payer: Self-pay | Admitting: Nurse Practitioner

## 2011-12-19 ENCOUNTER — Ambulatory Visit (INDEPENDENT_AMBULATORY_CARE_PROVIDER_SITE_OTHER): Payer: Medicare Other | Admitting: Nurse Practitioner

## 2011-12-19 VITALS — BP 148/90 | HR 80 | Ht 66.0 in | Wt 247.0 lb

## 2011-12-19 DIAGNOSIS — J45909 Unspecified asthma, uncomplicated: Secondary | ICD-10-CM | POA: Insufficient documentation

## 2011-12-19 DIAGNOSIS — K219 Gastro-esophageal reflux disease without esophagitis: Secondary | ICD-10-CM | POA: Insufficient documentation

## 2011-12-19 DIAGNOSIS — E785 Hyperlipidemia, unspecified: Secondary | ICD-10-CM | POA: Insufficient documentation

## 2011-12-19 DIAGNOSIS — Z86718 Personal history of other venous thrombosis and embolism: Secondary | ICD-10-CM | POA: Insufficient documentation

## 2011-12-19 DIAGNOSIS — F209 Schizophrenia, unspecified: Secondary | ICD-10-CM | POA: Insufficient documentation

## 2011-12-19 DIAGNOSIS — D649 Anemia, unspecified: Secondary | ICD-10-CM

## 2011-12-19 DIAGNOSIS — G8929 Other chronic pain: Secondary | ICD-10-CM | POA: Insufficient documentation

## 2011-12-19 DIAGNOSIS — G4730 Sleep apnea, unspecified: Secondary | ICD-10-CM | POA: Insufficient documentation

## 2011-12-19 DIAGNOSIS — I10 Essential (primary) hypertension: Secondary | ICD-10-CM | POA: Insufficient documentation

## 2011-12-19 DIAGNOSIS — Z86711 Personal history of pulmonary embolism: Secondary | ICD-10-CM | POA: Insufficient documentation

## 2011-12-19 DIAGNOSIS — Z7901 Long term (current) use of anticoagulants: Secondary | ICD-10-CM

## 2011-12-19 DIAGNOSIS — D509 Iron deficiency anemia, unspecified: Secondary | ICD-10-CM | POA: Insufficient documentation

## 2011-12-19 DIAGNOSIS — Z760 Encounter for issue of repeat prescription: Secondary | ICD-10-CM | POA: Insufficient documentation

## 2011-12-19 LAB — CBC
HCT: 33.8 % — ABNORMAL LOW (ref 36.0–46.0)
Hemoglobin: 11.2 g/dL — ABNORMAL LOW (ref 12.0–15.0)
MCH: 30.9 pg (ref 26.0–34.0)
MCHC: 33.1 g/dL (ref 30.0–36.0)
MCV: 93.1 fL (ref 78.0–100.0)
Platelets: 250 10*3/uL (ref 150–400)
RBC: 3.63 MIL/uL — ABNORMAL LOW (ref 3.87–5.11)
RDW: 14.6 % (ref 11.5–15.5)
WBC: 6.2 10*3/uL (ref 4.0–10.5)

## 2011-12-19 LAB — BASIC METABOLIC PANEL
BUN: 7 mg/dL (ref 6–23)
CO2: 30 mEq/L (ref 19–32)
Calcium: 9.7 mg/dL (ref 8.4–10.5)
Chloride: 100 mEq/L (ref 96–112)
Creatinine, Ser: 0.72 mg/dL (ref 0.50–1.10)
GFR calc Af Amer: >90 mL/min (ref >90)
GFR calc non Af Amer: 86 mL/min — ABNORMAL LOW (ref >90)
Glucose, Bld: 91 mg/dL (ref 70–99)
Potassium: 3.2 mEq/L — ABNORMAL LOW (ref 3.5–5.1)
Sodium: 139 mEq/L (ref 135–145)

## 2011-12-19 MED ORDER — MOVIPREP 100 G PO SOLR: 1.0000 | Freq: Once | ORAL | Status: DC

## 2011-12-19 NOTE — ED Notes (Signed)
Pt saw md today to get set up for colonoscopy.  Pt has spells where she is moving things all the times.  PT sts vertigo pill and ativan pills had tops off one nite and found some on the floor and she has no idea what she did with those pills.  Pt sts this happen Thursday nite after being discharged from [facility].    Pt thinks she has been having periods of confusion.  Pt knows it is March, thinks [age] year and knows name.  Follows commands.  Pt took [place] on Thursday nite.

## 2011-12-19 NOTE — ED Provider Notes (Signed)
History     CSN: [id number]  Arrival date & time 12/19/11  1700   First MD Initiated Contact with Patient 12/19/11 2110      No chief complaint on file.   (Consider location/radiation/quality/duration/timing/severity/associated sxs/prior treatment) HPI  71 year old female with history of schizophrenia presents requesting for medication refills. Patient states she had her normal medication, Ultram, and Ativan filled by her primary care Dr. 6 days ago. This morning she woke up and noticed that the pills were missing. She saw a few on the floor but cannot account for the rest of the pills. She denies taking more than prescribed dose. She is here requesting for medication refills. Patient also complains of periods of confusion. This has been ongoing for a while but she's unable to quantify. Otherwise, patient denies any other symptoms. She denies headache, chest pain, shortness of breath, abdominal pain, numbness or weakness. Patient lives with her husband, and her son. She does not think that the medication was stolen.  Past Medical History  Diagnosis Date  . Depression   . Schizophrenia   . Arthritis   . Hyperlipidemia   . Hypertension   . Chronic pain   . 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 DVT (deep venous thrombosis)     per 01/17/10 d/c summary- "remote hx of dvt"?  . Iron deficiency anemia   . Chronic headaches   . Sleep apnea   . Glaucoma   . Hemorrhoids   . Asthma   . Anxiety   . GERD (gastroesophageal reflux disease)     Past Surgical History  Procedure Date  . Spinal fusion     2004  . Joint replacement right knee 11/2008  . Tubal ligation year 62  . Carpal tunnel release     bilateral     Family History  Problem Relation Age of Onset  . Diabetes Sister   . Colon cancer Brother 40    History  Substance Use Topics  . Smoking status: Never Smoker   . Smokeless tobacco: Never Used  . Alcohol Use: No    OB History    Grav  Para Term Preterm Abortions TAB SAB Ect Mult Living                  Review of Systems  All other systems reviewed and are negative.    Allergies  Adhesive  Home Medications   Current Outpatient Rx  Name Route Sig Dispense Refill  . ALBUTEROL SULFATE HFA 108 (90 BASE) MCG/ACT IN AERS Inhalation Inhale 2 puffs into the lungs every 4 (four) hours as needed. For shortness of breath or wheezing.     Marland Kitchen ESOMEPRAZOLE MAGNESIUM 40 MG PO CPDR Oral Take 40 mg by mouth daily.    Marland Kitchen FERROUS SULFATE 325 (65 FE) MG PO TABS Oral Take 325 mg by mouth 2 (two) times daily with a meal.    . FLUOXETINE HCL 20 MG PO CAPS Oral Take 60 mg by mouth daily. Pt takes 3 capsules of 20 mg for 60 mg dose daily    . FLUTICASONE PROPIONATE  HFA 220 MCG/ACT IN AERO Inhalation Inhale 1 puff into the lungs 2 (two) times daily as needed. Cough/shortness of breath    . GABAPENTIN 100 MG PO CAPS Oral Take 200 mg by mouth at bedtime.     Marland Kitchen HYDROCHLOROTHIAZIDE 25 MG PO TABS Oral Take 25 mg by mouth daily. For high blood pressure     .  IBUPROFEN 400 MG PO TABS Oral Take 400 mg by mouth every 6 (six) hours as needed. Pain pt states she takes up ten tablets daily    . LORAZEPAM 1 MG PO TABS Oral Take 1 tablet (1 mg total) by mouth every 8 (eight) hours. 65 tablet 0  . MECLIZINE HCL 25 MG PO TABS Oral Take 25 mg by mouth every 8 (eight) hours as needed. For vertigo    . POTASSIUM CHLORIDE CRYS ER 20 MEQ PO TBCR Oral Take 20 mEq by mouth daily.      . TRIAMCINOLONE ACETONIDE 0.1 % EX CREA Topical Apply 1 application topically Three times a day.    Marland Kitchen VITAMIN D (ERGOCALCIFEROL) 50000 UNITS PO CAPS Oral Take 50,000 Units by mouth every 7 (seven) days. Sundays    . WARFARIN SODIUM 5 MG PO TABS Oral Take 1 tablet (5 mg total) by mouth daily. 30 tablet 0  . ZETIA 10 MG PO TABS Oral Take 1 tablet by mouth daily.      BP 171/88  Pulse 76  Temp(Src) 97.8 F (36.6 C) (Oral)  Resp 20  SpO2 93%  Physical Exam  Nursing note and  vitals reviewed. Constitutional: She is oriented to person, place, and time. She appears well-developed and well-nourished. No distress.  HENT:  Head: Atraumatic.  Eyes: Conjunctivae are normal.  Neck: Normal range of motion. Neck supple.  Cardiovascular: Normal rate and regular rhythm.   Pulmonary/Chest: Effort normal and breath sounds normal. No respiratory distress.  Abdominal: Soft.  Musculoskeletal: Normal range of motion.  Neurological: She is alert and oriented to person, place, and time. She displays normal reflexes. She exhibits normal muscle tone. Coordination normal.  Skin: Skin is warm.  Psychiatric: She has a normal mood and affect.    ED Course  Procedures (including critical care time)  Labs Reviewed  CBC - Abnormal; Notable for the following:    RBC 3.63 (*)    Hemoglobin 11.2 (*)    HCT 33.8 (*)    All other components within normal limits  BASIC METABOLIC PANEL - Abnormal; Notable for the following:    Potassium 3.2 (*)    GFR calc non Af Amer 86 (*)    All other components within normal limits  URINALYSIS, ROUTINE W REFLEX MICROSCOPIC   No results found.   No diagnosis found.  Results for orders placed during the hospital encounter of 12/19/11  CBC      Component Value Range   WBC 6.2  4.0 - 10.5 (K/uL)   RBC 3.63 (*) 3.87 - 5.11 (MIL/uL)   Hemoglobin 11.2 (*) 12.0 - 15.0 (g/dL)   HCT 96.0 (*) 45.4 - 46.0 (%)   MCV 93.1  78.0 - 100.0 (fL)   MCH 30.9  26.0 - 34.0 (pg)   MCHC 33.1  30.0 - 36.0 (g/dL)   RDW 09.8  11.9 - 14.7 (%)   Platelets 250  150 - 400 (K/uL)  BASIC METABOLIC PANEL      Component Value Range   Sodium 139  135 - 145 (mEq/L)   Potassium 3.2 (*) 3.5 - 5.1 (mEq/L)   Chloride 100  96 - 112 (mEq/L)   CO2 30  19 - 32 (mEq/L)   Glucose, Bld 91  70 - 99 (mg/dL)   BUN 7  6 - 23 (mg/dL)   Creatinine, Ser 8.29  0.50 - 1.10 (mg/dL)   Calcium 9.7  8.4 - 56.2 (mg/dL)   GFR calc non Af Amer 86 (*) >  90 (mL/min)   GFR calc Af Amer >90  >90  (mL/min)        MDM  Patient brought with her all of her medications in a bag.  2 of the medication containers were missing pills. She was prescribed 65 pills of Ultram on March 21, as well as a 65 pills of lorazepam on March 21. No pills were available today. I recommend for patient to follow up with the prescribing doctor for refills. No refills today. Patient voiced understanding. She is currently in no acute distress, and denies of any specific symptoms. Her potassium is 3.2 today, which is at her baseline. I recommend for her to eat plenty of bananas. I also recommend for further followup.        Fayrene Helper, PA-C 12/19/11 2131

## 2011-12-19 NOTE — Discharge Instructions (Signed)
Medication Refill, Emergency Department  It is best for your medical care, however, to take care of getting refills done through your primary caregiver's office. They have your records and can do a better job of follow-up than we can in the emergency department. On maintenance medications, we often only prescribe enough medications to get you by until you are able to see your regular caregiver. This is a more expensive way to refill medications. In the future, please plan for refills so that you will not have to use the emergency department for this. Thank you for your help. Your help allows [place] to better take care of the daily emergencies that enter our department. Document Released: 12/28/2003 Document Revised: 08/30/2011 Document Reviewed: 09/10/2005 [facility] Patient Information 2012 [place], [place].

## 2011-12-19 NOTE — Patient Instructions (Signed)
We scheduled the procedure with Dr Russella Dar for 01-22-2012. Directions and brochure provided.  We will contact the Coumadin clinic and notify you regarding the Coumadin medication. They may want you to hold it for 5-7 days before the procedure. We will call you. Keep taking the Coumadin until we call you with directions.

## 2011-12-19 NOTE — ED Notes (Addendum)
Pt states that she has misplaced some of her pills. She denies taking to many. Pt is alert, and oriented x 4. Pt has some confusion as to what happened to her medications. Some oh her pills she found on the floor. Pt lost 65 pills. Pill bottles without tops tramadol, nexium, and ativan

## 2011-12-19 NOTE — ED Notes (Signed)
Pt states understanding that she needs to follow up with her provider on getting more of her schedule medications

## 2011-12-19 NOTE — ED Notes (Signed)
PA at bedside.

## 2011-12-20 ENCOUNTER — Other Ambulatory Visit: Payer: Medicare Other | Admitting: Lab

## 2011-12-20 ENCOUNTER — Telehealth: Payer: Self-pay | Admitting: Oncology

## 2011-12-20 ENCOUNTER — Ambulatory Visit: Payer: Medicare Other

## 2011-12-20 ENCOUNTER — Other Ambulatory Visit: Payer: Self-pay | Admitting: Nurse Practitioner

## 2011-12-20 NOTE — Telephone Encounter (Signed)
called pt lmovm for appt on 04/09

## 2011-12-20 NOTE — Telephone Encounter (Signed)
Called pt at her home and left a message.  I asked Willette Cluster ACNP if she would give the pt a prescription of Precocet which she requested on the phone.  She said she was given some in the hospital.  Gunnar Fusi said she would not give her a script due to the fact that she did not complain of pain when she was here yesterday the 27th.  I suggested she ask her Primary care MD when she talks to him about her Vertigo. She mentioned Vertigo to me when she was leaving our office.

## 2011-12-21 ENCOUNTER — Encounter: Payer: Self-pay | Admitting: Nurse Practitioner

## 2011-12-21 NOTE — Progress Notes (Addendum)
TYLAN BRIGUGLIO [id number] 1941/06/20   HISTORY OR PRESENT ILLNESS :  This is a 71 year old female with multiple medical problems and a strong family history of colon cancer. Patient saw Dr. Russella Dar in our office a year ago for normocytic anemia. An EGD and colonoscopy were recommended but patient cancelled as she was not ready to pursue workup. .  Patient hospitalized a few days ago for low volume rectal bleeding which didn't require any blood transfusions. Her nadir hemoglobin was 10.4, about baseline for her. Patient has had intermittent rectal bleeding on a chronic basis and attributes it to constipation. There were discussions about doing a colonoscopy , +/- EGD while patient was hospitalized but it was ultimately decided that patient would have those done outpatient as recommended at her March 2012 visit with Dr. Russella Dar.  She comes today to get those procedures scheduled and make arrangements for Coumadin to be held.  Current Medications, Allergies, Past Medical History, Past Surgical History, Family History and Social History were reviewed in [employer] record.   PHYSICAL EXAMINATION : General:  Well developed  black in no acute distress Neck: Supple, no masses.  Lungs: Clear throughout to auscultation Heart: Regular rate and rhythm Abdomen: Soft, nondistended, nontender. No masses or hepatomegaly noted. Normal bowel sounds Rectal: not done Neurological: Oriented, grossly nonfocal Cervical Nodes:  No significant cervical adenopathy Psychological:  Alert and cooperative. Normal mood and affect  ASSESSMENT AND PLAN :  53. 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  2. Normocytic anemia, chronic and stable but needs to be evaluated as we recommended in March 2012. Patient will be scheduled for an EGD and colonoscopy with propofol with Dr. Russella Dar. We have contacted Dr. Clarisa Fling office  about holding her Coumadin for the procedure. She may require a Lovenox bridge but waiting to hear back. The risks, benefits, and alternatives to EGD and colonoscopy with possible biopsy were discussed with the patient and she consents to proceed.    She will also have EGD at the time   Addendum: Reviewed and agree with management. Carie Caddy. Pyrtle, M.D.  12/24/2011

## 2011-12-23 ENCOUNTER — Emergency Department (HOSPITAL_COMMUNITY)
Admission: EM | Admit: 2011-12-23 | Discharge: 2011-12-23 | Disposition: A | Discharge status: Home / Self Care | Payer: Medicare Other | Attending: Emergency Medicine | Admitting: Emergency Medicine

## 2011-12-23 ENCOUNTER — Encounter (HOSPITAL_COMMUNITY): Payer: Self-pay | Admitting: *Deleted

## 2011-12-23 DIAGNOSIS — Z86718 Personal history of other venous thrombosis and embolism: Secondary | ICD-10-CM | POA: Insufficient documentation

## 2011-12-23 DIAGNOSIS — M25569 Pain in unspecified knee: Secondary | ICD-10-CM | POA: Insufficient documentation

## 2011-12-23 DIAGNOSIS — M549 Dorsalgia, unspecified: Secondary | ICD-10-CM

## 2011-12-23 DIAGNOSIS — M5430 Sciatica, unspecified side: Secondary | ICD-10-CM | POA: Insufficient documentation

## 2011-12-23 DIAGNOSIS — Z7901 Long term (current) use of anticoagulants: Secondary | ICD-10-CM | POA: Insufficient documentation

## 2011-12-23 DIAGNOSIS — Z96659 Presence of unspecified artificial knee joint: Secondary | ICD-10-CM | POA: Insufficient documentation

## 2011-12-23 DIAGNOSIS — F209 Schizophrenia, unspecified: Secondary | ICD-10-CM | POA: Insufficient documentation

## 2011-12-23 DIAGNOSIS — Z981 Arthrodesis status: Secondary | ICD-10-CM | POA: Insufficient documentation

## 2011-12-23 DIAGNOSIS — Z86711 Personal history of pulmonary embolism: Secondary | ICD-10-CM | POA: Insufficient documentation

## 2011-12-23 DIAGNOSIS — Z79899 Other long term (current) drug therapy: Secondary | ICD-10-CM | POA: Insufficient documentation

## 2011-12-23 DIAGNOSIS — F3289 Other specified depressive episodes: Secondary | ICD-10-CM | POA: Insufficient documentation

## 2011-12-23 DIAGNOSIS — J45909 Unspecified asthma, uncomplicated: Secondary | ICD-10-CM | POA: Insufficient documentation

## 2011-12-23 DIAGNOSIS — K219 Gastro-esophageal reflux disease without esophagitis: Secondary | ICD-10-CM | POA: Insufficient documentation

## 2011-12-23 DIAGNOSIS — F329 Major depressive disorder, single episode, unspecified: Secondary | ICD-10-CM | POA: Insufficient documentation

## 2011-12-23 DIAGNOSIS — M129 Arthropathy, unspecified: Secondary | ICD-10-CM | POA: Insufficient documentation

## 2011-12-23 DIAGNOSIS — E785 Hyperlipidemia, unspecified: Secondary | ICD-10-CM | POA: Insufficient documentation

## 2011-12-23 DIAGNOSIS — I10 Essential (primary) hypertension: Secondary | ICD-10-CM | POA: Insufficient documentation

## 2011-12-23 MED ORDER — OXYCODONE-ACETAMINOPHEN 5-325 MG PO TABS
1.0000 | ORAL_TABLET | Freq: Once | ORAL | Status: AC
  Administered 2011-12-23: 1 via ORAL
  Filled 2011-12-23: qty 1

## 2011-12-23 MED ORDER — OXYCODONE-ACETAMINOPHEN 5-325 MG PO TABS: 2.0000 | ORAL_TABLET | ORAL | Status: DC | PRN

## 2011-12-23 NOTE — ED Provider Notes (Signed)
Medical screening examination/treatment/procedure(s) were performed by non-physician practitioner and as supervising physician I was immediately available for consultation/collaboration.  Giordan Fordham, MD 12/23/11 2248 

## 2011-12-23 NOTE — Discharge Instructions (Signed)
[name] [name] week 43 Percocet in the ER today for your back pain. Also gave you prescription for Percocet for severe pain. He not drive for the rest of the day. Followup with her also medical clinic this week. Return to the ER if you have incontinence urinating or incontinent of bowel movement. Take the tramadol you have at home for pain first before you take the percocet.    Back Pain, Adult Back pain is very common. The pain often gets better over time. The cause of back pain is usually not dangerous. Most people can learn to manage their back pain on their own.  HOME CARE   Stay active. Start with short walks on flat ground if you can. Try to walk farther each day.   Do not sit, drive, or stand in one place for more than 30 minutes. Do not stay in bed.   Do not avoid exercise or work. Activity can help your back heal faster.   Be careful when you bend or lift an object. Bend at your knees, keep the object close to you, and do not twist.   Sleep on a firm mattress. Lie on your side, and bend your knees. If you lie on your back, put a pillow under your knees.   Only take medicines as told by your doctor.   Put ice on the injured area.   Put ice in a plastic bag.   Place a towel between your skin and the bag.   Leave the ice on for 15 to 20 minutes, 3 to 4 times a day for the first 2 to 3 days. After that, you can switch between ice and heat packs.   Ask your doctor about back exercises or massage.   Avoid feeling anxious or stressed. Find good ways to deal with stress, such as exercise.  GET HELP RIGHT AWAY IF:   Your pain does not go away with rest or medicine.   Your pain does not go away in 1 week.   You have new problems.   You do not feel well.   The pain spreads into your legs.   You cannot control when you poop (bowel movement) or pee (urinate).   Your arms or legs feel weak or lose feeling (numbness).   You feel sick to your stomach (nauseous) or throw up (vomit).     You have belly (abdominal) pain.   You feel like you may pass out (faint).  MAKE SURE YOU:   Understand these instructions.   Will watch your condition.   Will get help right away if you are not doing well or get worse.  Document Released: 02/27/2008 Document Revised: 08/30/2011 Document Reviewed: 01/29/2011 [facility] Patient Information 2012 [place], [place].Back Pain, Adult Back pain is very common. The pain often gets better over time. The cause of back pain is usually not dangerous. Most people can learn to manage their back pain on their own.  HOME CARE   Stay active. Start with short walks on flat ground if you can. Try to walk farther each day.   Do not sit, drive, or stand in one place for more than 30 minutes. Do not stay in bed.   Do not avoid exercise or work. Activity can help your back heal faster.   Be careful when you bend or lift an object. Bend at your knees, keep the object close to you, and do not twist.   Sleep on a firm mattress. Lie on your side,  and bend your knees. If you lie on your back, put a pillow under your knees.   Only take medicines as told by your doctor.   Put ice on the injured area.   Put ice in a plastic bag.   Place a towel between your skin and the bag.   Leave the ice on for 15 to 20 minutes, 3 to 4 times a day for the first 2 to 3 days. After that, you can switch between ice and heat packs.   Ask your doctor about back exercises or massage.   Avoid feeling anxious or stressed. Find good ways to deal with stress, such as exercise.  GET HELP RIGHT AWAY IF:   Your pain does not go away with rest or medicine.   Your pain does not go away in 1 week.   You have new problems.   You do not feel well.   The pain spreads into your legs.   You cannot control when you poop (bowel movement) or pee (urinate).   Your arms or legs feel weak or lose feeling (numbness).   You feel sick to your stomach (nauseous) or throw up (vomit).    You have belly (abdominal) pain.   You feel like you may pass out (faint).  MAKE SURE YOU:   Understand these instructions.   Will watch your condition.   Will get help right away if you are not doing well or get worse.  Document Released: 02/27/2008 Document Revised: 08/30/2011 Document Reviewed: 01/29/2011 [facility] Patient Information 2012 [place], [place].

## 2011-12-23 NOTE — ED Notes (Signed)
Pt reports chronic back pain and has flares that are worse with bad weather.  Pt also reports pain to right knee. Pt reports right knee pain is also chronic, but worse with bad weather.  Pt reports not taking anything for pain. Pain is 9/10

## 2011-12-23 NOTE — ED Provider Notes (Signed)
History     CSN: [id number]  Arrival date & time 12/23/11  1251   None     Chief Complaint  Patient presents with  . Knee Pain  . Back Pain    (Consider location/radiation/quality/duration/timing/severity/associated sxs/prior treatment) Patient is a 71 y.o. female presenting with knee pain and back pain. The history is provided by the patient. No language interpreter was used.  Knee Pain This is a chronic problem. The current episode started more than 1 year ago. The problem occurs daily. The problem has been gradually worsening. Associated symptoms include arthralgias. Pertinent negatives include no abdominal pain, chest pain, chills, diaphoresis, fever, joint swelling, nausea, neck pain, urinary symptoms or vomiting. The symptoms are aggravated by bending and walking. She has tried nothing for the symptoms. The treatment provided mild relief.  Back Pain  Pertinent negatives include no chest pain, no fever, no abdominal pain, no dysuria and no pelvic pain.  Reports R lower back pain with sciatica to R knee today that is chronic.  Back surgery and R knee surgery in the past. States that when it is raining outside the pain is worse.  Has taken nothing for pain. Denies fever, bowel or bladder problems.    Past Medical History  Diagnosis Date  . Depression   . Schizophrenia   . Arthritis   . Hyperlipidemia   . Hypertension   . Chronic pain   . 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 DVT (deep venous thrombosis)     per 01/17/10 d/c summary- "remote hx of dvt"?  . Iron deficiency anemia   . Chronic headaches   . Sleep apnea   . Glaucoma   . Hemorrhoids   . Asthma   . Anxiety   . GERD (gastroesophageal reflux disease)     Past Surgical History  Procedure Date  . Spinal fusion     2004  . Joint replacement right knee 11/2008  . Tubal ligation year 23  . Carpal tunnel release     bilateral     Family History  Problem Relation Age of Onset  .  Diabetes Sister   . Colon cancer Brother 40    History  Substance Use Topics  . Smoking status: Never Smoker   . Smokeless tobacco: Never Used  . Alcohol Use: No    OB History    Grav Para Term Preterm Abortions TAB SAB Ect Mult Living                  Review of Systems  Constitutional: Negative.  Negative for fever, chills and diaphoresis.  HENT: Negative.  Negative for neck pain.   Respiratory: Negative.   Cardiovascular: Negative.  Negative for chest pain.  Gastrointestinal: Negative.  Negative for nausea, vomiting, abdominal pain and blood in stool.  Genitourinary: Negative for dysuria, urgency, frequency, hematuria, vaginal discharge, enuresis, difficulty urinating, vaginal pain and pelvic pain.  Musculoskeletal: Positive for back pain and arthralgias. Negative for joint swelling.  Skin: Negative.   Neurological: Negative.   Psychiatric/Behavioral: Negative.   All other systems reviewed and are negative.    Allergies  Adhesive  Home Medications   Current Outpatient Rx  Name Route Sig Dispense Refill  . ALBUTEROL SULFATE HFA 108 (90 BASE) MCG/ACT IN AERS Inhalation Inhale 2 puffs into the lungs every 4 (four) hours as needed. For shortness of breath or wheezing.     Marland Kitchen ESOMEPRAZOLE MAGNESIUM 40 MG PO CPDR Oral Take 40  mg by mouth daily.    Marland Kitchen FERROUS SULFATE 325 (65 FE) MG PO TABS Oral Take 325 mg by mouth 2 (two) times daily with a meal.    . FLUOXETINE HCL 20 MG PO CAPS Oral Take 60 mg by mouth daily. Pt takes 3 capsules of 20 mg for 60 mg dose daily    . FLUTICASONE PROPIONATE  HFA 220 MCG/ACT IN AERO Inhalation Inhale 1 puff into the lungs 2 (two) times daily as needed. Cough/shortness of breath    . GABAPENTIN 100 MG PO CAPS Oral Take 200 mg by mouth at bedtime.     Marland Kitchen HYDROCHLOROTHIAZIDE 25 MG PO TABS Oral Take 25 mg by mouth daily. For high blood pressure     . IBUPROFEN 400 MG PO TABS Oral Take 400 mg by mouth every 6 (six) hours as needed. Pain pt states she  takes up ten tablets daily    . MECLIZINE HCL 25 MG PO TABS Oral Take 25 mg by mouth every 8 (eight) hours as needed. For vertigo    . POTASSIUM CHLORIDE CRYS ER 20 MEQ PO TBCR Oral Take 20 mEq by mouth daily.      . TRIAMCINOLONE ACETONIDE 0.1 % EX CREA Topical Apply 1 application topically Three times a day.    Marland Kitchen VITAMIN D (ERGOCALCIFEROL) 50000 UNITS PO CAPS Oral Take 50,000 Units by mouth every 7 (seven) days. Sundays    . WARFARIN SODIUM 5 MG PO TABS Oral Take 1 tablet (5 mg total) by mouth daily. 30 tablet 0    BP 138/81  Pulse 84  Temp(Src) 98.7 F (37.1 C) (Oral)  Resp 16  SpO2 98%  Physical Exam  Nursing note and vitals reviewed. Constitutional: She is oriented to person, place, and time. She appears well-developed and well-nourished.  HENT:  Head: Normocephalic.  Eyes: Conjunctivae and EOM are normal. Pupils are equal, round, and reactive to light.  Neck: Normal range of motion. Neck supple.  Cardiovascular: Normal rate and regular rhythm.   Pulmonary/Chest: Effort normal.  Abdominal: Soft.  Musculoskeletal: Normal range of motion. She exhibits tenderness.       RL back and R knee tenderness.    Neurological: She is alert and oriented to person, place, and time. She displays normal reflexes.  Skin: Skin is warm and dry.  Psychiatric: She has a normal mood and affect.    ED Course  Procedures (including critical care time)  Labs Reviewed - No data to display No results found.   No diagnosis found.    MDM  R lower back pain with sciatica to R knee treated with percocet in the ER.  Rx for the same. Keep follow up appt this week at at [facility].        Jethro Bastos, NP 12/24/11 1139

## 2011-12-24 ENCOUNTER — Other Ambulatory Visit: Payer: Medicare Other | Admitting: Lab

## 2011-12-24 ENCOUNTER — Ambulatory Visit (HOSPITAL_BASED_OUTPATIENT_CLINIC_OR_DEPARTMENT_OTHER): Payer: Medicare Other | Admitting: Pharmacist

## 2011-12-24 DIAGNOSIS — I2699 Other pulmonary embolism without acute cor pulmonale: Secondary | ICD-10-CM

## 2011-12-24 DIAGNOSIS — D6859 Other primary thrombophilia: Secondary | ICD-10-CM

## 2011-12-24 DIAGNOSIS — Z7901 Long term (current) use of anticoagulants: Secondary | ICD-10-CM

## 2011-12-24 DIAGNOSIS — D6851 Activated protein C resistance: Secondary | ICD-10-CM

## 2011-12-24 NOTE — Progress Notes (Signed)
INR today = 1.6, goal = 2-3. Pt has not missed any doses, but has only been on Coumadin x 4 days after her hospitalization for GI bleed.  She has a colonoscopy scheduled for 01/22/12 with Dr. Rhea Belton.  He has contacted our office to try to arrange for anticoagulation around the procedure.  Pt states that she is no longer having any problems with the rectal bleeding now.  No other complaints.  Will continue current dose of 5mg  daily, as she has likely not reached steady-state, and recheck INR in 1 week.

## 2011-12-25 NOTE — ED Provider Notes (Signed)
Medical screening examination/treatment/procedure(s) were performed by non-physician practitioner and as supervising physician I was immediately available for consultation/collaboration.  [name], MD 12/25/11 [id number]

## 2011-12-31 ENCOUNTER — Telehealth: Payer: Self-pay | Admitting: Pharmacist

## 2011-12-31 NOTE — Telephone Encounter (Signed)
Called to clarify/change anticoagulation plans surrounding her colonoscopy scheduled for 4/30.  Pt and husband wrote down the following plan, and were able to repeat it successfully back to me:  4/25 - Take last dose of Coumadin 4/26 - Hold Coumadin 4/29 - INR at [facility] Clinic 4/30 - Colonoscopy, restart Coumadin that evening at usual dose 5/1 - Start Lovenox (rx has been called in to her pharmacy) 5/6 - INR at the Coumadin Clinic.

## 2011-12-31 NOTE — Telephone Encounter (Signed)
Called patient to discuss anticoagulation surrounding colonoscopy scheduled for 01/22/12.  She and her husband wrote down the following instructions and were able to successfully repeat them back to me:  01/17/12 - Take last dose of Coumadin 01/18/12 - Hold Coumadin 01/19/12 - Start Lovenox 150mg  SQ daily 01/21/12 @ 11am - INR at [facility]in Clinic 01/22/12 - Hold Lovenox, Colonoscopy 01/23/12 - Start Coumadin, Start Lovenox 01/28/12 @ 11am - INR at Coumadin Clinic  Confirmed dose of Lovenox with Dr. Cyndie Chime, and will call Rx in to her [facility] pharmacy.  Pt knows that she can call us with any questions or concerns.

## 2012-01-01 ENCOUNTER — Ambulatory Visit (HOSPITAL_BASED_OUTPATIENT_CLINIC_OR_DEPARTMENT_OTHER): Payer: Medicare Other | Admitting: Nurse Practitioner

## 2012-01-01 ENCOUNTER — Telehealth: Payer: Self-pay | Admitting: Oncology

## 2012-01-01 ENCOUNTER — Ambulatory Visit (HOSPITAL_BASED_OUTPATIENT_CLINIC_OR_DEPARTMENT_OTHER): Payer: Medicare Other | Admitting: Pharmacist

## 2012-01-01 ENCOUNTER — Other Ambulatory Visit (HOSPITAL_BASED_OUTPATIENT_CLINIC_OR_DEPARTMENT_OTHER): Payer: Medicare Other | Admitting: Lab

## 2012-01-01 VITALS — BP 157/99 | HR 90 | Temp 97.3°F | Ht 66.0 in | Wt 237.5 lb

## 2012-01-01 DIAGNOSIS — Z7901 Long term (current) use of anticoagulants: Secondary | ICD-10-CM

## 2012-01-01 DIAGNOSIS — D6859 Other primary thrombophilia: Secondary | ICD-10-CM

## 2012-01-01 DIAGNOSIS — I2699 Other pulmonary embolism without acute cor pulmonale: Secondary | ICD-10-CM

## 2012-01-01 DIAGNOSIS — D649 Anemia, unspecified: Secondary | ICD-10-CM

## 2012-01-01 DIAGNOSIS — I8390 Asymptomatic varicose veins of unspecified lower extremity: Secondary | ICD-10-CM

## 2012-01-01 DIAGNOSIS — I471 Supraventricular tachycardia, unspecified: Secondary | ICD-10-CM

## 2012-01-01 LAB — CBC WITH DIFFERENTIAL/PLATELET
Basophils Absolute: 0 10*3/uL (ref 0.0–0.1)
Eosinophils Absolute: 0.1 10*3/uL (ref 0.0–0.5)
HGB: 12.1 g/dL (ref 11.6–15.9)
LYMPH%: 12.5 % — ABNORMAL LOW (ref 14.0–49.7)
MCV: 91 fL (ref 79.5–101.0)
MONO#: 0.3 10*3/uL (ref 0.1–0.9)
MONO%: 3.8 % (ref 0.0–14.0)
NEUT#: 7.2 10*3/uL — ABNORMAL HIGH (ref 1.5–6.5)
Platelets: 311 10*3/uL (ref 145–400)
RBC: 4.01 10*6/uL (ref 3.70–5.45)
RDW: 14.8 % — ABNORMAL HIGH (ref 11.2–14.5)
WBC: 8.7 10*3/uL (ref 3.9–10.3)
nRBC: 0 % (ref 0–0)

## 2012-01-01 LAB — POCT INR: INR: 1.7

## 2012-01-01 LAB — PROTIME-INR: Protime: 20.4 Seconds — ABNORMAL HIGH (ref 10.6–13.4)

## 2012-01-01 NOTE — Progress Notes (Signed)
INR = 1.7 today. She has not missed any doses of 5 mg/day. Pt has already taken 5 mg dose today so I had pt take an additional 1/2 tab = 7.5 mg x 1 today during our visit. She will return to 5 mg/day tomorrow. We'll see her as planned on 01/21/12 prior to planned colonoscopy. I provided her a copy of the bridging plan outlined by [facility], Pharm.D. Yesterday w/ pt over phone.  Pt understands plan & will p/u her Lovenox syringes today. Marily Lente, Pharm.D.

## 2012-01-01 NOTE — Telephone Encounter (Signed)
gve the pt her aug 2013 appt calendar 

## 2012-01-01 NOTE — Progress Notes (Signed)
OFFICE PROGRESS NOTE  Interval history:  [name] [name] is a 71 year old woman with a history of unprovoked pulmonary emboli in April 2011. She was found to be a heterozygote for the factor V Leiden gene mutation. She was also found to be anemic and has a strong family history of colon cancer. She was referred to gastroenterology and scheduled for a colonoscopy. She did not go through with the colonoscopy. She was referred back to gastroenterology and has an appointment scheduled for a colonoscopy at the end of this month. She is seen today for scheduled followup.  [name] [name] plans to have the colonoscopy as scheduled. She denies bleeding. No shortness of breath or chest pain. No interim illnesses or infections. She reports pain related to fibromyalgia.   Objective: Blood pressure 157/99, pulse 90, temperature 97.3 F (36.3 C), temperature source Oral, height 5\' 6"  (1.676 m), weight 237 lb 8 oz (107.729 kg).  Oropharynx is without thrush or ulceration. No palpable cervical or supraclavicular lymph nodes. Lungs are clear. Regular cardiac rhythm. Abdomen is soft and nontender. No organomegaly. Extremities are without edema. She is wearing elastic stockings.  Lab Results: Lab Results  Component Value Date   WBC 8.7 01/01/2012   HGB 12.1 01/01/2012   HCT 36.5 01/01/2012   MCV 91.0 01/01/2012   PLT 311 01/01/2012    Chemistry:    Chemistry      Component Value Date/Time   NA 139 12/19/2011 1903   K 3.2* 12/19/2011 1903   CL 100 12/19/2011 1903   CO2 30 12/19/2011 1903   BUN 7 12/19/2011 1903   CREATININE 0.72 12/19/2011 1903   CREATININE 0.91 11/08/2010 1518      Component Value Date/Time   CALCIUM 9.7 12/19/2011 1903   ALKPHOS 87 12/11/2011 1220   AST 23 12/11/2011 1220   ALT 13 12/11/2011 1220   BILITOT 0.2* 12/11/2011 1220     PT 20.4, INR 1.70.  Studies/Results: US Venous Img Lower Unilateral Left  12/05/2011  *RADIOLOGY REPORT*  Clinical Data: Left leg pain and swelling.  LEFT LOWER EXTREMITY  VENOUS DUPLEX ULTRASOUND  Technique:  Gray-scale sonography with graded compression, as well as color Doppler and duplex ultrasound, were performed to evaluate the deep venous system of the lower extremity from the level of the common femoral vein through the popliteal and proximal calf veins. Spectral Doppler was utilized to evaluate flow at rest and with distal augmentation maneuvers.  Comparison:  None  Findings: There is patent flow in the common femoral, profunda femoral, superficial femoral and popliteal veins.  These vessels were completely compressible and demonstrate increased flow with augmentation.  IMPRESSION: Negative venous Doppler examination for deep venous thrombosis.  Original Report Authenticated By: P. Loralie Champagne, M.D.    Medications: I have reviewed the patient's current medications.  Assessment/Plan:  1. Unprovoked pulmonary emboli April 2011.  She is maintained on chronic Coumadin anticoagulation followed through our office.  2. Heterozygote factor V Leiden gene mutation. 3. Family history of colon cancer. 4. Normochromic anemia.   5. Varicose veins. 6. History of paroxysmal supraventricular tachycardia. 7. Obesity, sleep apnea syndrome. 8. Essential hypertension. 9. Degenerative arthritis. 10. Chronic undifferentiated schizophrenia.  Disposition-[name] [name] appears stable. She continues Coumadin. She is scheduled for a colonoscopy on 01/22/2012. She has been given instructions regarding anticoagulation prior to and following the colonoscopy. She is scheduled to return for a visit at the Coumadin clinic on 01/21/2012. She will return for a followup visit with Dr. Cyndie Chime in 4 months.  She will contact the office in the interim with any problems.  [name] [name] ANP/GNP-BC

## 2012-01-03 ENCOUNTER — Encounter (HOSPITAL_COMMUNITY): Payer: Self-pay | Admitting: *Deleted

## 2012-01-03 ENCOUNTER — Emergency Department (HOSPITAL_COMMUNITY)
Admission: EM | Admit: 2012-01-03 | Discharge: 2012-01-03 | Disposition: A | Discharge status: Home / Self Care | Payer: Medicare Other | Attending: Emergency Medicine | Admitting: Emergency Medicine

## 2012-01-03 DIAGNOSIS — G8929 Other chronic pain: Secondary | ICD-10-CM | POA: Insufficient documentation

## 2012-01-03 DIAGNOSIS — D509 Iron deficiency anemia, unspecified: Secondary | ICD-10-CM | POA: Insufficient documentation

## 2012-01-03 DIAGNOSIS — M129 Arthropathy, unspecified: Secondary | ICD-10-CM | POA: Insufficient documentation

## 2012-01-03 DIAGNOSIS — J45909 Unspecified asthma, uncomplicated: Secondary | ICD-10-CM | POA: Insufficient documentation

## 2012-01-03 DIAGNOSIS — Z86711 Personal history of pulmonary embolism: Secondary | ICD-10-CM | POA: Insufficient documentation

## 2012-01-03 DIAGNOSIS — F209 Schizophrenia, unspecified: Secondary | ICD-10-CM | POA: Insufficient documentation

## 2012-01-03 DIAGNOSIS — I10 Essential (primary) hypertension: Secondary | ICD-10-CM | POA: Insufficient documentation

## 2012-01-03 DIAGNOSIS — Z981 Arthrodesis status: Secondary | ICD-10-CM | POA: Insufficient documentation

## 2012-01-03 DIAGNOSIS — E785 Hyperlipidemia, unspecified: Secondary | ICD-10-CM | POA: Insufficient documentation

## 2012-01-03 DIAGNOSIS — G4730 Sleep apnea, unspecified: Secondary | ICD-10-CM | POA: Insufficient documentation

## 2012-01-03 DIAGNOSIS — M549 Dorsalgia, unspecified: Secondary | ICD-10-CM | POA: Insufficient documentation

## 2012-01-03 DIAGNOSIS — Z86718 Personal history of other venous thrombosis and embolism: Secondary | ICD-10-CM | POA: Insufficient documentation

## 2012-01-03 MED ORDER — ONDANSETRON 4 MG PO TBDP
4.0000 mg | ORAL_TABLET | Freq: Once | ORAL | Status: AC
  Administered 2012-01-03: 4 mg via ORAL
  Filled 2012-01-03: qty 1

## 2012-01-03 MED ORDER — OXYCODONE-ACETAMINOPHEN 5-325 MG PO TABS: 2.0000 | ORAL_TABLET | ORAL | Status: AC | PRN

## 2012-01-03 MED ORDER — MORPHINE SULFATE 4 MG/ML IJ SOLN: 4.0000 mg | Freq: Once | INTRAMUSCULAR | Status: DC

## 2012-01-03 MED ORDER — MORPHINE SULFATE 4 MG/ML IJ SOLN
4.0000 mg | Freq: Once | INTRAMUSCULAR | Status: AC
  Administered 2012-01-03: 4 mg via INTRAMUSCULAR
  Filled 2012-01-03: qty 1

## 2012-01-03 NOTE — ED Notes (Signed)
Pt states she has a pinched nerve in her neck. Pt states she is starting to have neck and back pain.pt state she was not able to pick up her pain medication today and the pain has increased

## 2012-01-03 NOTE — Discharge Instructions (Signed)
Chronic Back Pain When back pain lasts longer than 3 months, it is called chronic back pain.This pain can be frustrating, but the cause of the pain is rarely dangerous.People with chronic back pain often go through certain periods that are more intense (flare-ups). CAUSES Chronic back pain can be caused by wear and tear (degeneration) on different structures in your back. These structures may include bones, ligaments, or discs. This degeneration may result in more pressure being placed on the nerves that travel to your legs and feet. This can lead to pain traveling from the low back down the back of the legs. When pain lasts longer than 3 months, it is not unusual for people to experience anxiety or depression. Anxiety and depression can also contribute to low back pain. TREATMENT  Establish a regular exercise plan. This is critical to improving your functional level.   Have a self-management plan for when you flare-up. Flare-ups rarely require a medical visit. Regular exercise will help reduce the intensity and frequency of your flare-ups.   Manage how you feel about your back pain and the rest of your life. Anxiety, depression, and feeling that you cannot alter your back pain have been shown to make back pain more intense and debilitating.   Medicines should never be your only treatment. They should be used along with other treatments to help you return to a more active lifestyle.   Procedures such as injections or surgery may be helpful but are rarely necessary. You may be able to get the same results with physical therapy or chiropractic care.  HOME CARE INSTRUCTIONS  Avoid bending, heavy lifting, prolonged sitting, and activities which make the problem worse.   Continue normal activity as much as possible.   Take brief periods of rest throughout the day to reduce your pain during flare-ups.   Follow your back exercise rehabilitation program. This can help reduce symptoms and prevent  more pain.   Only take over-the-counter or prescription medicines as directed by your caregiver. Muscle relaxants are sometimes prescribed. Narcotic pain medicine is discouraged for long-term pain, since addiction is a possible outcome.   If you smoke, quit.   Eat healthy foods and maintain a recommended body weight.  SEEK IMMEDIATE MEDICAL CARE IF:   You have weakness or numbness in one of your legs or feet.   You have trouble controlling your bladder or bowels.   You develop nausea, vomiting, abdominal pain, shortness of breath, or fainting.  Document Released: 10/18/2004 Document Revised: 08/30/2011 Document Reviewed: 08/25/2011 [employer] Patient Information 2012 [employer]. 

## 2012-01-03 NOTE — ED Provider Notes (Signed)
History     CSN: [id number]  Arrival date & time 01/03/12  1656   First MD Initiated Contact with Patient 01/03/12 1900      Chief Complaint  Patient presents with  . Neck Pain  . Back Pain    (Consider location/radiation/quality/duration/timing/severity/associated sxs/prior treatment) HPI  Pt presents to the ED with complaints of chronic back pain exacerbation. Her son is disabled and she was in the ED with him yesterday after he had a seizure. She states that she took with her primary care doctor, Dr.Avbuerre has agreed to refill her medications. However she was unable to get to his office before closed. She states that she has a long history of which she needs to have surgery for her but has been avoiding because she is scared. She states that she does need something for pain tonight and that she will go to Dr. Cornell Barman office tomorrow to pick up her prescription. She denies this pain being any different from her normal chronic pain. No new symptoms of numbness, weakness, dizziness, nausea, bowel or urinary irregularities.   Past Medical History  Diagnosis Date  . Depression   . Schizophrenia   . Arthritis   . Hyperlipidemia   . Hypertension   . Chronic pain   . 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 DVT (deep venous thrombosis)     per 01/17/10 d/c summary- "remote hx of dvt"?  . Iron deficiency anemia   . Chronic headaches   . Sleep apnea   . Glaucoma   . Hemorrhoids   . Asthma   . Anxiety   . GERD (gastroesophageal reflux disease)     Past Surgical History  Procedure Date  . Spinal fusion     2004  . Joint replacement right knee 11/2008  . Tubal ligation year 51  . Carpal tunnel release     bilateral     Family History  Problem Relation Age of Onset  . Diabetes Sister   . Colon cancer Brother 40    History  Substance Use Topics  . Smoking status: Never Smoker   . Smokeless tobacco: Never Used  . Alcohol Use: No    OB  History    Grav Para Term Preterm Abortions TAB SAB Ect Mult Living                  Review of Systems  All other systems reviewed and are negative.    Allergies  Adhesive  Home Medications   Current Outpatient Rx  Name Route Sig Dispense Refill  . ALBUTEROL SULFATE HFA 108 (90 BASE) MCG/ACT IN AERS Inhalation Inhale 2 puffs into the lungs every 4 (four) hours as needed. For shortness of breath or wheezing.     Marland Kitchen ESOMEPRAZOLE MAGNESIUM 40 MG PO CPDR Oral Take 40 mg by mouth daily.    Marland Kitchen FERROUS SULFATE 325 (65 FE) MG PO TABS Oral Take 325 mg by mouth 2 (two) times daily with a meal.    . FLUOXETINE HCL 20 MG PO CAPS Oral Take 20-40 mg by mouth 2 (two) times daily. 60mg  daily, broken up into 2 caps in am, 1 cap in pm usually.    Marland Kitchen FLUTICASONE PROPIONATE  HFA 220 MCG/ACT IN AERO Inhalation Inhale 1 puff into the lungs 2 (two) times daily as needed. Cough/shortness of breath    . GABAPENTIN 100 MG PO CAPS Oral Take 100-200 mg by mouth 2 (two) times daily. 1  in am, 2 in pm    . HYDROCHLOROTHIAZIDE 25 MG PO TABS Oral Take 25 mg by mouth daily. For high blood pressure     . MECLIZINE HCL 25 MG PO TABS Oral Take 25 mg by mouth every 8 (eight) hours as needed. For vertigo    . POTASSIUM CHLORIDE CRYS ER 20 MEQ PO TBCR Oral Take 20 mEq by mouth daily.      . TRAMADOL HCL 50 MG PO TABS Oral Take 50 mg by mouth every 6 (six) hours as needed. For pain.    . TRIAMCINOLONE ACETONIDE 0.1 % EX CREA Topical Apply 1 application topically 3 (three) times daily.     Marland Kitchen VITAMIN D (ERGOCALCIFEROL) 50000 UNITS PO CAPS Oral Take 50,000 Units by mouth every 7 (seven) days. Sundays    . WARFARIN SODIUM 5 MG PO TABS Oral Take 5 mg by mouth daily.    . OXYCODONE-ACETAMINOPHEN 5-325 MG PO TABS Oral Take 2 tablets by mouth every 4 (four) hours as needed for pain. 6 tablet 0    BP 158/91  Pulse 97  Temp(Src) 98.5 F (36.9 C) (Oral)  Resp 20  Ht 5\' 6"  (1.676 m)  Wt 240 lb (108.863 kg)  BMI 38.74 kg/m2  SpO2  96%  Physical Exam  Nursing note and vitals reviewed. Constitutional: She appears well-developed and well-nourished. No distress.  HENT:  Head: Normocephalic and atraumatic.  Eyes: Pupils are equal, round, and reactive to light.  Neck: Normal range of motion. Neck supple.  Cardiovascular: Normal rate and regular rhythm.   Pulmonary/Chest: Effort normal.  Abdominal: Soft.  Musculoskeletal:       Cervical back: She exhibits decreased range of motion, tenderness and pain.       Thoracic back: She exhibits decreased range of motion, tenderness and pain. She exhibits no bony tenderness, no swelling and no deformity.       Back:       Pt wearing neck brace   Neurological: She is alert.  Skin: Skin is warm and dry.    ED Course  Procedures (including critical care time)  Labs Reviewed - No data to display No results found.   1. Chronic back pain       MDM  Pt has no neurological deficits and denies this back pain being different from her norm. Pts pain treated in ED. Also given a small prescription for pain medications for tonight.  Pt has been advised of the symptoms that warrant their return to the ED. Patient has voiced understanding and has agreed to follow-up with the PCP or specialist.        Dorthula Matas, PA 01/03/12 1942

## 2012-01-04 ENCOUNTER — Telehealth: Payer: Self-pay | Admitting: Oncology

## 2012-01-04 NOTE — ED Provider Notes (Signed)
Medical screening examination/treatment/procedure(s) were performed by non-physician practitioner and as supervising physician I was immediately available for consultation/collaboration.  Raeford Razor, MD 01/04/12 [phone]

## 2012-01-04 NOTE — Telephone Encounter (Signed)
Pt stopped by to advise [place] that she is going to cx her colon with dr stark.  Spoke with myrtle and she advised that she not cx but that we cannot stop her.  Pt is advised and will call dr stark.  Pts sch for 12/2011 was printed

## 2012-01-11 ENCOUNTER — Encounter (HOSPITAL_COMMUNITY): Payer: Self-pay

## 2012-01-11 ENCOUNTER — Emergency Department (INDEPENDENT_AMBULATORY_CARE_PROVIDER_SITE_OTHER)
Admission: EM | Admit: 2012-01-11 | Discharge: 2012-01-11 | Disposition: A | Discharge status: Home / Self Care | Payer: Medicare Other | Source: Home / Self Care

## 2012-01-11 DIAGNOSIS — M25512 Pain in left shoulder: Secondary | ICD-10-CM

## 2012-01-11 DIAGNOSIS — M25519 Pain in unspecified shoulder: Secondary | ICD-10-CM

## 2012-01-11 MED ORDER — TRAMADOL HCL 50 MG PO TABS: 50.0000 mg | ORAL_TABLET | Freq: Four times a day (QID) | ORAL | Status: DC | PRN

## 2012-01-11 NOTE — Discharge Instructions (Signed)
Thank you for coming in today. Take the tramadol as needed for shoulder pain. Please see your doctor on Monday about your leg. Try to keep it propped up. Go to emergency room if you have shortness of breath chest pain or trouble breathing.

## 2012-01-11 NOTE — ED Provider Notes (Signed)
[name] [name] is a 71 y.o. female who presents to Urgent Care today for  1) left shoulder pain. Patient has chronic left shoulder pain that is being managed by her orthopedic physicians. She has what appears to be a diagnosis of a labral injury. She ran out of tramadol like a refill. She denies any new numbness or weakness.  2) bilateral leg swelling: Left worse than right. Gradual onset over the last month or so. Tends to be worse at the end of the day after she's been standing on her feet. No difficulty breathing or cough. 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     PMH reviewed. Significant for multiple medical problems ROS as above otherwise neg.  no chest pains, palpitations, fevers, chills, abdominal pain nausea or vomiting. Medications reviewed. No current facility-administered medications for this encounter.   Current Outpatient Prescriptions  Medication Sig Dispense Refill  . albuterol (VENTOLIN HFA) 108 (90 BASE) MCG/ACT inhaler Inhale 2 puffs into the lungs every 4 (four) hours as needed. For shortness of breath or wheezing.       Marland Kitchen esomeprazole (NEXIUM) 40 MG capsule Take 40 mg by mouth daily.      . ferrous sulfate 325 (65 FE) MG tablet Take 325 mg by mouth 2 (two) times daily with a meal.      . FLUoxetine (PROZAC) 20 MG capsule Take 20-40 mg by mouth 2 (two) times daily. 60mg  daily, broken up into 2 caps in am, 1 cap in pm usually.      . fluticasone (FLOVENT HFA) 220 MCG/ACT inhaler Inhale 1 puff into the lungs 2 (two) times daily as needed. Cough/shortness of breath      . gabapentin (NEURONTIN) 100 MG capsule Take 100-200 mg by mouth 2 (two) times daily. 1 in am, 2 in pm      . hydrochlorothiazide 25 MG tablet Take 25 mg by mouth daily. For high blood pressure       . meclizine (ANTIVERT) 25 MG tablet Take 25 mg by mouth every 8 (eight) hours as needed. For vertigo      . oxyCODONE-acetaminophen (PERCOCET) 5-325 MG per tablet Take 2 tablets by  mouth every 4 (four) hours as needed for pain.  6 tablet  0  . potassium chloride SA (K-DUR,KLOR-CON) 20 MEQ tablet Take 20 mEq by mouth daily.        . traMADol (ULTRAM) 50 MG tablet Take 1 tablet (50 mg total) by mouth every 6 (six) hours as needed. For pain.  30 tablet  0  . triamcinolone (KENALOG) 0.1 % cream Apply 1 application topically 3 (three) times daily.       . Vitamin D, Ergocalciferol, (DRISDOL) 50000 UNITS CAPS Take 50,000 Units by mouth every 7 (seven) days. Sundays      . warfarin (COUMADIN) 5 MG tablet Take 5 mg by mouth daily.        Exam:  BP 146/79  Pulse 77  Temp(Src) 98.8 F (37.1 C) (Oral)  Resp 18  SpO2 99% Gen: Well NAD HEENT: EOMI,  MMM Lungs: CTABL Nl WOB Heart: RRR no MRG Abd: NABS, NT, ND Exts: Non edematous BL  LE, warm and well perfused.  MSK: Left shoulder decreased range of motion in abduction. Decreased external rotation. Strength diminished. Hand strength normal.  No results found for this or any previous visit (from the past 24 hour(s)). No results found.  Assessment and Plan: 71 y.o. female with  1) chronic left shoulder pain. Has run out of tramadol. Plan to refill with quantity sufficient to get her to her primary care orthopedic visit in one week.  She expresses understanding about the dosing instructions and knows not to take too much with fluoxetine.  2) leg swelling: Likely dependent edema however I am moderately concern for DVT. Patient is on warfarin therapy already therefore there is no change in management of her diagnosis of DVT this evening. Advised keeping her feet propped up in following up with her doctor on Monday. Discussed warning signs and symptoms for PE including difficulty breathing or chest pain. She'll go the emergency room if she develops these. She expresses understanding.     Rodolph Bong, MD 01/11/12 2122

## 2012-01-11 NOTE — ED Notes (Signed)
Pain in shoulder

## 2012-01-12 NOTE — ED Provider Notes (Signed)
Medical screening examination/treatment/procedure(s) were performed by PGY-3 FM resident and as supervising physician I was immediately available for consultation/collaboration.   Sharin Grave, MD   Sharin Grave, MD 01/12/12 [phone]

## 2012-01-16 ENCOUNTER — Other Ambulatory Visit: Payer: Self-pay | Admitting: Interventional Radiology

## 2012-01-16 DIAGNOSIS — I83812 Varicose veins of left lower extremities with pain: Secondary | ICD-10-CM

## 2012-01-17 ENCOUNTER — Other Ambulatory Visit (HOSPITAL_COMMUNITY): Payer: Self-pay | Admitting: Family Medicine

## 2012-01-20 ENCOUNTER — Encounter (HOSPITAL_COMMUNITY): Payer: Self-pay

## 2012-01-20 ENCOUNTER — Emergency Department (INDEPENDENT_AMBULATORY_CARE_PROVIDER_SITE_OTHER)
Admission: EM | Admit: 2012-01-20 | Discharge: 2012-01-20 | Disposition: A | Discharge status: Home / Self Care | Payer: Medicare Other | Source: Home / Self Care | Attending: Family Medicine | Admitting: Family Medicine

## 2012-01-20 DIAGNOSIS — I8310 Varicose veins of unspecified lower extremity with inflammation: Secondary | ICD-10-CM

## 2012-01-20 MED ORDER — TRIAMCINOLONE ACETONIDE 0.1 % EX OINT: TOPICAL_OINTMENT | Freq: Two times a day (BID) | CUTANEOUS | Status: DC

## 2012-01-20 NOTE — Discharge Instructions (Signed)
Continue to wear your compression stockings as discussed. Apply the ointment to affected areas as directed. Please follow up with your PCP for continued management of your symptoms.

## 2012-01-20 NOTE — ED Notes (Signed)
rash on bilateral lower legs.  left lower leg more than right.

## 2012-01-20 NOTE — ED Provider Notes (Signed)
History     CSN: [id number]  Arrival date & time 01/20/12  1900   First MD Initiated Contact with Patient 01/20/12 1901      Chief Complaint  Patient presents with  . Rash    rash on bilateral lower legs.  left lower leg more than right.    (Consider location/radiation/quality/duration/timing/severity/associated sxs/prior treatment) HPI Comments: [name] [name] presents for evaluation of several itchy, erythematous, bumps over her legs bilaterally. Temporally, she relates it to starting furosemide. She also uses compression stockings. She usually uses a triamcinolone 0.1% cream but she has been out of it for a while; she reports that she was given the "wrong ointment." She has a hx of chronic venous stasis, and varicose veins, and is followed with serial imaging and treatment by her PCP.   Patient is a 71 y.o. female presenting with rash. The history is provided by the patient.  Rash  This is a new problem. The current episode started more than 1 week ago. The problem has not changed since onset.The problem is associated with an unknown factor. There has been no fever. The rash is present on the left lower leg and right lower leg.    Past Medical History  Diagnosis Date  . Depression   . Schizophrenia   . Arthritis   . Hyperlipidemia   . Hypertension   . Chronic pain   . 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 DVT (deep venous thrombosis)     per 01/17/10 d/c summary- "remote hx of dvt"?  . Iron deficiency anemia   . Chronic headaches   . Sleep apnea   . Glaucoma   . Hemorrhoids   . Asthma   . Anxiety   . GERD (gastroesophageal reflux disease)     Past Surgical History  Procedure Date  . Spinal fusion     2004  . Joint replacement right knee 11/2008  . Tubal ligation year 26  . Carpal tunnel release     bilateral     Family History  Problem Relation Age of Onset  . Diabetes Sister   . Colon cancer Brother 40    History  Substance Use Topics    . Smoking status: Never Smoker   . Smokeless tobacco: Never Used  . Alcohol Use: No    OB History    Grav Para Term Preterm Abortions TAB SAB Ect Mult Living                  Review of Systems  Constitutional: Negative.   HENT: Negative.   Eyes: Negative.   Respiratory: Negative.   Cardiovascular: Negative.   Gastrointestinal: Negative.   Genitourinary: Negative.   Musculoskeletal: Negative.   Skin: Positive for rash.  Neurological: Negative.     Allergies  Adhesive  Home Medications   Current Outpatient Rx  Name Route Sig Dispense Refill  . ALBUTEROL SULFATE HFA 108 (90 BASE) MCG/ACT IN AERS Inhalation Inhale 2 puffs into the lungs every 4 (four) hours as needed. For shortness of breath or wheezing.     . ATENOLOL 100 MG PO TABS Oral Take 100 mg by mouth daily.    Marland Kitchen DICLOFENAC SODIUM 1 % TD GEL Topical Apply topically.    . ESOMEPRAZOLE MAGNESIUM 40 MG PO CPDR Oral Take 40 mg by mouth daily.    Marland Kitchen FERROUS SULFATE 325 (65 FE) MG PO TABS Oral Take 325 mg by mouth 2 (two) times daily with a meal.    .  FLUOXETINE HCL 20 MG PO CAPS Oral Take 20-40 mg by mouth 2 (two) times daily. 60mg  daily, broken up into 2 caps in am, 1 cap in pm usually.    . FUROSEMIDE 40 MG PO TABS Oral Take 40 mg by mouth daily.    Marland Kitchen HYDROCHLOROTHIAZIDE 25 MG PO TABS Oral Take 25 mg by mouth daily. For high blood pressure     . MECLIZINE HCL 25 MG PO TABS Oral Take 25 mg by mouth every 8 (eight) hours as needed. For vertigo    . POTASSIUM CHLORIDE CRYS ER 20 MEQ PO TBCR Oral Take 20 mEq by mouth daily.      . WARFARIN SODIUM 5 MG PO TABS Oral Take 5 mg by mouth daily.    Marland Kitchen CLONAZEPAM 0.5 MG PO TABS Oral Take 0.5 mg by mouth 1 day or 1 dose.    Marland Kitchen FLUTICASONE PROPIONATE  HFA 220 MCG/ACT IN AERO Inhalation Inhale 1 puff into the lungs 2 (two) times daily as needed. Cough/shortness of breath    . GABAPENTIN 100 MG PO CAPS Oral Take 100-200 mg by mouth 2 (two) times daily. 1 in am, 2 in pm    . TRAMADOL HCL  50 MG PO TABS Oral Take 1 tablet (50 mg total) by mouth every 6 (six) hours as needed. For pain. 30 tablet 0  . TRIAMCINOLONE ACETONIDE 0.1 % EX CREA Topical Apply 1 application topically 3 (three) times daily.     . TRIAMCINOLONE ACETONIDE 0.1 % EX OINT Topical Apply topically 2 (two) times daily. 80 g 0  . VITAMIN D (ERGOCALCIFEROL) 50000 UNITS PO CAPS Oral Take 50,000 Units by mouth every 7 (seven) days. Sundays      BP 175/86  Pulse 74  Resp 18  SpO2 100%  Physical Exam  Nursing note and vitals reviewed. Constitutional: She is oriented to person, place, and time. She appears well-developed and well-nourished.  HENT:  Head: Normocephalic and atraumatic.  Eyes: EOM are normal.  Neck: Normal range of motion.  Pulmonary/Chest: Effort normal.  Musculoskeletal: Normal range of motion.  Neurological: She is alert and oriented to person, place, and time.  Skin: Skin is warm and dry. Lesion noted.          Varicose veins, hyperpigmented changes consistent with chronic venous stasis, several small erythematous lesions with excoriation  Psychiatric: Her behavior is normal.    ED Course  Procedures (including critical care time)  Labs Reviewed - No data to display No results found.   1. Venous stasis dermatitis       MDM  rx given for triamcinolone 0.1% ointment        Renaee Munda, MD 01/20/12 1954

## 2012-01-21 ENCOUNTER — Other Ambulatory Visit: Payer: Medicare Other | Admitting: Lab

## 2012-01-21 ENCOUNTER — Ambulatory Visit (HOSPITAL_BASED_OUTPATIENT_CLINIC_OR_DEPARTMENT_OTHER): Payer: Medicare Other | Admitting: Pharmacist

## 2012-01-21 DIAGNOSIS — D6851 Activated protein C resistance: Secondary | ICD-10-CM

## 2012-01-21 DIAGNOSIS — D6859 Other primary thrombophilia: Secondary | ICD-10-CM

## 2012-01-21 DIAGNOSIS — Z7901 Long term (current) use of anticoagulants: Secondary | ICD-10-CM

## 2012-01-21 DIAGNOSIS — I2699 Other pulmonary embolism without acute cor pulmonale: Secondary | ICD-10-CM

## 2012-01-21 LAB — PROTIME-INR
INR: 2.4 (ref 2.00–3.50)
Protime: 28.8 Seconds — ABNORMAL HIGH (ref 10.6–13.4)

## 2012-01-21 MED ORDER — WARFARIN SODIUM 5 MG PO TABS: 5.0000 mg | ORAL_TABLET | Freq: Every day | ORAL | Status: DC

## 2012-01-21 NOTE — Progress Notes (Signed)
INR = 2.4 on 5 mg/day Pt went to urgent care recently for rash on her legs.  RX triamcinolone cream. Today her back is sore & she has had hot flashes a lot during the morning.  No complaints re: anticoag. Cont. 5 mg/day Return in 3 weeks. 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  She states her plan is to have the colonoscopy in Aug/Sept. Marily Lente, Pharm.D.

## 2012-01-22 ENCOUNTER — Encounter: Payer: Medicare Other | Admitting: Gastroenterology

## 2012-01-26 ENCOUNTER — Other Ambulatory Visit: Payer: Self-pay | Admitting: Oncology

## 2012-01-27 ENCOUNTER — Encounter (HOSPITAL_COMMUNITY): Payer: Self-pay | Admitting: Family Medicine

## 2012-01-27 ENCOUNTER — Emergency Department (HOSPITAL_COMMUNITY)
Admission: EM | Admit: 2012-01-27 | Discharge: 2012-01-27 | Disposition: A | Discharge status: Home / Self Care | Payer: Medicare Other | Attending: Emergency Medicine | Admitting: Emergency Medicine

## 2012-01-27 DIAGNOSIS — L989 Disorder of the skin and subcutaneous tissue, unspecified: Secondary | ICD-10-CM | POA: Insufficient documentation

## 2012-01-27 DIAGNOSIS — R21 Rash and other nonspecific skin eruption: Secondary | ICD-10-CM | POA: Insufficient documentation

## 2012-01-27 DIAGNOSIS — Z7901 Long term (current) use of anticoagulants: Secondary | ICD-10-CM | POA: Insufficient documentation

## 2012-01-27 DIAGNOSIS — I10 Essential (primary) hypertension: Secondary | ICD-10-CM | POA: Insufficient documentation

## 2012-01-27 DIAGNOSIS — L299 Pruritus, unspecified: Secondary | ICD-10-CM | POA: Insufficient documentation

## 2012-01-27 MED ORDER — HYDROXYZINE HCL 25 MG PO TABS: 25.0000 mg | ORAL_TABLET | Freq: Four times a day (QID) | ORAL | Status: DC

## 2012-01-27 MED ORDER — HYDROXYZINE HCL 25 MG PO TABS
25.0000 mg | ORAL_TABLET | Freq: Once | ORAL | Status: AC
  Administered 2012-01-27: 25 mg via ORAL
  Filled 2012-01-27: qty 1

## 2012-01-27 NOTE — ED Notes (Signed)
Pt presented to the ER with c/o itching and generalized redness, states was seen in the [facility] and was given RX for the same, cream, however, denies any relief at this time.

## 2012-01-27 NOTE — ED Provider Notes (Signed)
Medical screening examination/treatment/procedure(s) were performed by non-physician practitioner and as supervising physician I was immediately available for consultation/collaboration.   Sunnie Nielsen, MD 01/27/12 [phone]

## 2012-01-27 NOTE — ED Provider Notes (Signed)
History     CSN: [id number]  Arrival date & time 01/27/12  9562   First MD Initiated Contact with Patient 01/27/12 0512      5:22 AM HPI Patient reports a rash since April 1. Patient seen in urgent care and was given triamcinolone. Reports persistent rash despite medication. Denies change in food, soaps, discharge,. Denies positive sick contacts. Reports rash is small and extremelypruritic. Denies fever, neck pain, headache, sore throat, chest pain, shortness of breath.   Patient is a 71 y.o. female presenting with rash. The history is provided by the patient.  Rash  This is a new problem. The current episode started more than 1 week ago. The problem has not changed since onset.The problem is associated with an unknown factor. There has been no fever. The rash is present on the torso, back, left arm, left upper leg, left lower leg, right arm, right upper leg and right lower leg. The pain is at a severity of 0/10. The patient is experiencing no pain. Associated symptoms include itching. Pertinent negatives include no blisters and no pain. She has tried anti-itch cream for the symptoms. The treatment provided no relief.    Past Medical History  Diagnosis Date  . Depression   . Schizophrenia   . Arthritis   . Hyperlipidemia   . Hypertension   . Chronic pain   . 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 DVT (deep venous thrombosis)     per 01/17/10 d/c summary- "remote hx of dvt"?  . Iron deficiency anemia   . Chronic headaches   . Sleep apnea   . Glaucoma   . Hemorrhoids   . Asthma   . Anxiety   . GERD (gastroesophageal reflux disease)     Past Surgical History  Procedure Date  . Spinal fusion     2004  . Joint replacement right knee 11/2008  . Tubal ligation year 61  . Carpal tunnel release     bilateral     Family History  Problem Relation Age of Onset  . Diabetes Sister   . Colon cancer Brother 40    History  Substance Use Topics  . Smoking  status: Never Smoker   . Smokeless tobacco: Never Used  . Alcohol Use: No    OB History    Grav Para Term Preterm Abortions TAB SAB Ect Mult Living                  Review of Systems  Constitutional: Negative for fever and chills.  HENT: Negative for rhinorrhea.   Eyes: Negative for redness.  Respiratory: Negative for cough, shortness of breath and wheezing.   Cardiovascular: Negative for chest pain and palpitations.  Gastrointestinal: Negative for nausea.  Skin: Positive for itching and rash.       Pruritus  All other systems reviewed and are negative.    Allergies  Adhesive  Home Medications   Current Outpatient Rx  Name Route Sig Dispense Refill  . ALBUTEROL SULFATE HFA 108 (90 BASE) MCG/ACT IN AERS Inhalation Inhale 2 puffs into the lungs every 4 (four) hours as needed. For shortness of breath or wheezing.     . ATENOLOL 100 MG PO TABS Oral Take 100 mg by mouth daily.    Marland Kitchen CLONAZEPAM 0.5 MG PO TABS Oral Take 0.5 mg by mouth 2 (two) times daily as needed. For anxiety    . ESOMEPRAZOLE MAGNESIUM 40 MG PO CPDR Oral Take 40  mg by mouth daily.    Marland Kitchen FERROUS SULFATE 325 (65 FE) MG PO TABS Oral Take 325 mg by mouth 2 (two) times daily with a meal.    . FLUOXETINE HCL 20 MG PO CAPS Oral Take 60 mg by mouth daily.     Marland Kitchen FLUTICASONE PROPIONATE  HFA 220 MCG/ACT IN AERO Inhalation Inhale 1 puff into the lungs 2 (two) times daily as needed. Cough/shortness of breath    . FUROSEMIDE 40 MG PO TABS Oral Take 40 mg by mouth daily as needed. For increased fluid in legs    . GABAPENTIN 100 MG PO CAPS Oral Take 200 mg by mouth at bedtime as needed. For pain    . HYDROCHLOROTHIAZIDE 25 MG PO TABS Oral Take 25 mg by mouth daily. For high blood pressure     . MECLIZINE HCL 25 MG PO TABS Oral Take 25 mg by mouth every 8 (eight) hours as needed. For vertigo    . POTASSIUM CHLORIDE CRYS ER 20 MEQ PO TBCR Oral Take 20 mEq by mouth daily.      . TRAMADOL HCL 50 MG PO TABS Oral Take 1 tablet (50 mg  total) by mouth every 6 (six) hours as needed. For pain. 30 tablet 0  . TRIAMCINOLONE ACETONIDE 0.1 % EX OINT Topical Apply topically 2 (two) times daily. 80 g 0  . VITAMIN D (ERGOCALCIFEROL) 50000 UNITS PO CAPS Oral Take 50,000 Units by mouth every 7 (seven) days. Sundays    . WARFARIN SODIUM 5 MG PO TABS Oral Take 1 tablet (5 mg total) by mouth daily. 30 tablet 3    BP 147/89  Pulse 63  Temp(Src) 98.4 F (36.9 C) (Oral)  Resp 18  SpO2 97%  Physical Exam  Vitals reviewed. Constitutional: She is oriented to person, place, and time. Vital signs are normal. She appears well-developed and well-nourished. No distress.  HENT:  Head: Normocephalic and atraumatic.  Eyes: Pupils are equal, round, and reactive to light.  Neck: Neck supple.  Pulmonary/Chest: Effort normal.  Neurological: She is alert and oriented to person, place, and time.  Skin: Skin is warm and dry. No rash noted. No erythema. No pallor.       Small petechial lesions on entire body except hands and feet. Patient is scratching as that enter the room. The lesions appear to be small punctate lesions. No erythema, or abscess.  Psychiatric: She has a normal mood and affect. Her behavior is normal.    ED Course  Procedures  MDM   Give patient a prescription for hydroxyzine and advised followup with dermatologist. Advised patient that our history. Scabies lesions do appear to be [place] at times. Patient likes this. Patient voices understanding and is ready for discharge       Thomasene Lot, Cordelia Poche 01/27/12 0532

## 2012-01-27 NOTE — ED Notes (Signed)
Patient states that she has been having a rash since April 1st. Was seen at Urgent Care on 4/28.  Started on Triamcinolone Cream. States cream did not help the itching and now rash is spreading and getting worse.

## 2012-01-27 NOTE — Discharge Instructions (Signed)
Rash A rash is a change in the color or texture of your skin. There are many different types of rashes. You may have other problems that accompany your rash. CAUSES   Infections.   Allergic reactions. This can include allergies to pets or foods.   Certain medicines.   Exposure to certain chemicals, soaps, or cosmetics.   Heat.   Exposure to poisonous plants.   Tumors, both cancerous and noncancerous.  SYMPTOMS   Redness.   Scaly skin.   Itchy skin.   Dry or cracked skin.   Bumps.   Blisters.   Pain.  DIAGNOSIS  Your caregiver may do a physical exam to determine what type of rash you have. A skin sample (biopsy) may be taken and examined under a microscope. TREATMENT  Treatment depends on the type of rash you have. Your caregiver may prescribe certain medicines. For serious conditions, you may need to see a skin doctor (dermatologist). HOME CARE INSTRUCTIONS   Avoid the substance that caused your rash.   Do not scratch your rash. This can cause infection.   You may take cool baths to help stop itching.   Only take over-the-counter or prescription medicines as directed by your caregiver.   Keep all follow-up appointments as directed by your caregiver.  SEEK IMMEDIATE MEDICAL CARE IF:  You have increasing pain, swelling, or redness.   You have a fever.   You have new or severe symptoms.   You have body aches, diarrhea, or vomiting.   Your rash is not better after 3 days.  MAKE SURE YOU:  Understand these instructions.   Will watch your condition.   Will get help right away if you are not doing well or get worse.  Document Released: 08/31/2002 Document Revised: 08/30/2011 Document Reviewed: 06/25/2011 [employer] Patient Information 2012 [employer]. 

## 2012-01-28 ENCOUNTER — Ambulatory Visit: Payer: Medicare Other

## 2012-01-28 ENCOUNTER — Other Ambulatory Visit: Payer: Medicare Other | Admitting: Lab

## 2012-02-03 ENCOUNTER — Emergency Department (HOSPITAL_COMMUNITY)
Admission: EM | Admit: 2012-02-03 | Discharge: 2012-02-03 | Disposition: A | Discharge status: Home / Self Care | Payer: Medicare Other | Attending: Emergency Medicine | Admitting: Emergency Medicine

## 2012-02-03 DIAGNOSIS — Z86718 Personal history of other venous thrombosis and embolism: Secondary | ICD-10-CM | POA: Insufficient documentation

## 2012-02-03 DIAGNOSIS — IMO0001 Reserved for inherently not codable concepts without codable children: Secondary | ICD-10-CM | POA: Insufficient documentation

## 2012-02-03 DIAGNOSIS — J45909 Unspecified asthma, uncomplicated: Secondary | ICD-10-CM | POA: Insufficient documentation

## 2012-02-03 DIAGNOSIS — K219 Gastro-esophageal reflux disease without esophagitis: Secondary | ICD-10-CM | POA: Insufficient documentation

## 2012-02-03 DIAGNOSIS — I10 Essential (primary) hypertension: Secondary | ICD-10-CM | POA: Insufficient documentation

## 2012-02-03 DIAGNOSIS — F341 Dysthymic disorder: Secondary | ICD-10-CM | POA: Insufficient documentation

## 2012-02-03 DIAGNOSIS — Z79899 Other long term (current) drug therapy: Secondary | ICD-10-CM | POA: Insufficient documentation

## 2012-02-03 DIAGNOSIS — M791 Myalgia, unspecified site: Secondary | ICD-10-CM

## 2012-02-03 DIAGNOSIS — G8929 Other chronic pain: Secondary | ICD-10-CM | POA: Insufficient documentation

## 2012-02-03 DIAGNOSIS — E785 Hyperlipidemia, unspecified: Secondary | ICD-10-CM | POA: Insufficient documentation

## 2012-02-03 DIAGNOSIS — M129 Arthropathy, unspecified: Secondary | ICD-10-CM | POA: Insufficient documentation

## 2012-02-03 DIAGNOSIS — Z8659 Personal history of other mental and behavioral disorders: Secondary | ICD-10-CM | POA: Insufficient documentation

## 2012-02-03 DIAGNOSIS — H409 Unspecified glaucoma: Secondary | ICD-10-CM | POA: Insufficient documentation

## 2012-02-03 MED ORDER — MORPHINE SULFATE 4 MG/ML IJ SOLN
4.0000 mg | Freq: Once | INTRAMUSCULAR | Status: AC
  Administered 2012-02-03: 4 mg via INTRAMUSCULAR
  Filled 2012-02-03: qty 1

## 2012-02-03 MED ORDER — CYCLOBENZAPRINE HCL 10 MG PO TABS: 10.0000 mg | ORAL_TABLET | Freq: Two times a day (BID) | ORAL | Status: AC | PRN

## 2012-02-03 MED ORDER — ZOLPIDEM TARTRATE 5 MG PO TABS: 5.0000 mg | ORAL_TABLET | Freq: Every evening | ORAL | Status: DC | PRN

## 2012-02-03 NOTE — ED Provider Notes (Signed)
History     CSN: [id number]  Arrival date & time 02/03/12  1125   First MD Initiated Contact with Patient 02/03/12 1149      Chief Complaint  Patient presents with  . Muscle Pain    (Consider location/radiation/quality/duration/timing/severity/associated sxs/prior treatment) HPI  Past Medical History  Diagnosis Date  . Depression   . Schizophrenia   . Arthritis   . Hyperlipidemia   . Hypertension   . Chronic pain   . 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 DVT (deep venous thrombosis)     per 01/17/10 d/c summary- "remote hx of dvt"?  . Iron deficiency anemia   . Chronic headaches   . Sleep apnea   . Glaucoma   . Hemorrhoids   . Asthma   . Anxiety   . GERD (gastroesophageal reflux disease)     Past Surgical History  Procedure Date  . Spinal fusion     2004  . Joint replacement right knee 11/2008  . Tubal ligation year 89  . Carpal tunnel release     bilateral     Family History  Problem Relation Age of Onset  . Diabetes Sister   . Colon cancer Brother 40    History  Substance Use Topics  . Smoking status: Never Smoker   . Smokeless tobacco: Never Used  . Alcohol Use: No    OB History    Grav Para Term Preterm Abortions TAB SAB Ect Mult Living                  Review of Systems  Allergies  Adhesive  Home Medications   Current Outpatient Rx  Name Route Sig Dispense Refill  . ALBUTEROL SULFATE HFA 108 (90 BASE) MCG/ACT IN AERS Inhalation Inhale 2 puffs into the lungs every 4 (four) hours as needed. For shortness of breath or wheezing.     . ATENOLOL 100 MG PO TABS Oral Take 100 mg by mouth daily.    Marland Kitchen CLONAZEPAM 0.5 MG PO TABS Oral Take 0.5 mg by mouth daily. For anxiety    . DICLOFENAC SODIUM 1 % TD GEL Topical Apply 1 application topically 4 (four) times daily as needed. For pain    . ESOMEPRAZOLE MAGNESIUM 40 MG PO CPDR Oral Take 40 mg by mouth daily.    Marland Kitchen FERROUS SULFATE 325 (65 FE) MG PO TABS Oral Take 325 mg by  mouth 2 (two) times daily with a meal.    . FLUOXETINE HCL 20 MG PO CAPS Oral Take 60 mg by mouth daily.     Marland Kitchen FLUTICASONE PROPIONATE  HFA 220 MCG/ACT IN AERO Inhalation Inhale 1 puff into the lungs 2 (two) times daily as needed. Cough/shortness of breath    . FUROSEMIDE 40 MG PO TABS Oral Take 40 mg by mouth daily as needed. For increased fluid in legs    . GABAPENTIN 100 MG PO CAPS Oral Take 200 mg by mouth at bedtime as needed. For pain    . HYDROCHLOROTHIAZIDE 25 MG PO TABS Oral Take 25 mg by mouth daily. For high blood pressure     . HYDROXYZINE HCL 25 MG PO TABS Oral Take 25 mg by mouth every 6 (six) hours. For itching    . MECLIZINE HCL 25 MG PO TABS Oral Take 25 mg by mouth every 8 (eight) hours as needed. For vertigo    . METHOCARBAMOL 500 MG PO TABS Oral Take 500 mg by mouth every  6 (six) hours as needed. For spasm    . POTASSIUM CHLORIDE CRYS ER 20 MEQ PO TBCR Oral Take 20 mEq by mouth daily.      Marland Kitchen PREDNISONE 20 MG PO TABS Oral Take 20 mg by mouth daily. For 7 days. Started 01/28/12. One tablet for 3 days, then one tablet for 4 days    . TRAMADOL HCL 50 MG PO TABS Oral Take 1 tablet (50 mg total) by mouth every 6 (six) hours as needed. For pain. 30 tablet 0  . TRIAMCINOLONE ACETONIDE 0.1 % EX OINT Topical Apply topically 2 (two) times daily. 80 g 0  . VITAMIN D (ERGOCALCIFEROL) 50000 UNITS PO CAPS Oral Take 50,000 Units by mouth every 7 (seven) days.    . WARFARIN SODIUM 5 MG PO TABS  TAKE 1 TABLET BY MOUTH EVERY DAY OR USE AS DIRECTED 30 tablet 4    BP 160/76  Pulse 57  Temp(Src) 98.3 F (36.8 C) (Oral)  Resp 18  SpO2 97%  Physical Exam  ED Course  Procedures (including critical care time) No tests indicated  Labs Reviewed - No data to display No results found.   No diagnosis found.    MDM  Myalgias after overexertion        Cheri Guppy, MD 02/03/12 1202

## 2012-02-03 NOTE — ED Notes (Signed)
Pt. Has generalized pain starts from her neck and runs down to her toes.  She feels it is from doing too much at home

## 2012-02-03 NOTE — Discharge Instructions (Signed)
Use Tylenol for pain and Flexeril for muscle relaxation.  Followup with your Dr. for reevaluation.

## 2012-02-05 ENCOUNTER — Ambulatory Visit
Admission: RE | Admit: 2012-02-05 | Discharge: 2012-02-05 | Disposition: A | Discharge status: Home / Self Care | Payer: Medicare Other | Source: Ambulatory Visit | Attending: Interventional Radiology | Admitting: Interventional Radiology

## 2012-02-05 ENCOUNTER — Other Ambulatory Visit: Payer: Medicare Other

## 2012-02-05 DIAGNOSIS — I83812 Varicose veins of left lower extremities with pain: Secondary | ICD-10-CM

## 2012-02-06 ENCOUNTER — Other Ambulatory Visit: Payer: Medicare Other

## 2012-02-11 ENCOUNTER — Ambulatory Visit: Payer: Medicare Other

## 2012-02-11 ENCOUNTER — Telehealth: Payer: Self-pay | Admitting: Pharmacist

## 2012-02-11 ENCOUNTER — Other Ambulatory Visit: Payer: Medicare Other | Admitting: Lab

## 2012-02-15 ENCOUNTER — Emergency Department (HOSPITAL_COMMUNITY)
Admission: EM | Admit: 2012-02-15 | Discharge: 2012-02-15 | Disposition: A | Discharge status: Home / Self Care | Payer: Medicare Other | Attending: Emergency Medicine | Admitting: Emergency Medicine

## 2012-02-15 ENCOUNTER — Encounter (HOSPITAL_COMMUNITY): Payer: Self-pay | Admitting: *Deleted

## 2012-02-15 DIAGNOSIS — D509 Iron deficiency anemia, unspecified: Secondary | ICD-10-CM | POA: Insufficient documentation

## 2012-02-15 DIAGNOSIS — K219 Gastro-esophageal reflux disease without esophagitis: Secondary | ICD-10-CM | POA: Insufficient documentation

## 2012-02-15 DIAGNOSIS — I10 Essential (primary) hypertension: Secondary | ICD-10-CM | POA: Insufficient documentation

## 2012-02-15 DIAGNOSIS — G4730 Sleep apnea, unspecified: Secondary | ICD-10-CM | POA: Insufficient documentation

## 2012-02-15 DIAGNOSIS — Z79899 Other long term (current) drug therapy: Secondary | ICD-10-CM | POA: Insufficient documentation

## 2012-02-15 DIAGNOSIS — F209 Schizophrenia, unspecified: Secondary | ICD-10-CM | POA: Insufficient documentation

## 2012-02-15 DIAGNOSIS — Z86711 Personal history of pulmonary embolism: Secondary | ICD-10-CM | POA: Insufficient documentation

## 2012-02-15 DIAGNOSIS — G8929 Other chronic pain: Secondary | ICD-10-CM | POA: Insufficient documentation

## 2012-02-15 DIAGNOSIS — E785 Hyperlipidemia, unspecified: Secondary | ICD-10-CM | POA: Insufficient documentation

## 2012-02-15 DIAGNOSIS — R21 Rash and other nonspecific skin eruption: Secondary | ICD-10-CM

## 2012-02-15 DIAGNOSIS — Z86718 Personal history of other venous thrombosis and embolism: Secondary | ICD-10-CM | POA: Insufficient documentation

## 2012-02-15 DIAGNOSIS — Z981 Arthrodesis status: Secondary | ICD-10-CM | POA: Insufficient documentation

## 2012-02-15 MED ORDER — PREDNISONE 10 MG PO TABS: 20.0000 mg | ORAL_TABLET | Freq: Every day | ORAL | Status: DC

## 2012-02-15 MED ORDER — TRAMADOL HCL 50 MG PO TABS: 50.0000 mg | ORAL_TABLET | Freq: Four times a day (QID) | ORAL | Status: DC | PRN

## 2012-02-15 MED ORDER — HYDROXYZINE HCL 25 MG PO TABS: 25.0000 mg | ORAL_TABLET | Freq: Four times a day (QID) | ORAL | Status: DC

## 2012-02-15 NOTE — ED Provider Notes (Signed)
History     CSN: [id number]  Arrival date & time 02/15/12  1540   First MD Initiated Contact with Patient 02/15/12 1619      Chief Complaint  Patient presents with  . Rash    (Consider location/radiation/quality/duration/timing/severity/associated sxs/prior treatment) HPI  71 year old female with history of arthritis, chronic pain, and DVT on warfarin presents complaining of rash. Patient states a month ago she developed a very itchy rash to the lower extremities. She was seen in the ED and also at her PCP for the same symptoms. She was given hydroxyzine, and steroid taper course. States her symptoms resolved after treatment. She has been symptoms free for the past 2 weeks, but since last night she noticed the same rash returns. Rashes is gradual on onset, affecting the lower extremities bilaterally, and described as intense itchiness. There is no associated fever, headache, joint pain, throat swelling, chest pain, shortness of breath, abdominal pain, nausea, vomiting, diarrhea. No precipitating factors.  Past Medical History  Diagnosis Date  . Depression   . Schizophrenia   . Arthritis   . Hyperlipidemia   . Hypertension   . Chronic pain   . 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 DVT (deep venous thrombosis)     per 01/17/10 d/c summary- "remote hx of dvt"?  . Iron deficiency anemia   . Chronic headaches   . Sleep apnea   . Glaucoma   . Hemorrhoids   . Asthma   . Anxiety   . GERD (gastroesophageal reflux disease)     Past Surgical History  Procedure Date  . Spinal fusion     2004  . Joint replacement right knee 11/2008  . Tubal ligation year 49  . Carpal tunnel release     bilateral     Family History  Problem Relation Age of Onset  . Diabetes Sister   . Colon cancer Brother 40    History  Substance Use Topics  . Smoking status: Never Smoker   . Smokeless tobacco: Never Used  . Alcohol Use: No    OB History    Grav Para Term  Preterm Abortions TAB SAB Ect Mult Living                  Review of Systems  All other systems reviewed and are negative.    Allergies  Adhesive  Home Medications   Current Outpatient Rx  Name Route Sig Dispense Refill  . ALBUTEROL SULFATE HFA 108 (90 BASE) MCG/ACT IN AERS Inhalation Inhale 2 puffs into the lungs every 4 (four) hours as needed. For shortness of breath or wheezing.     . ATENOLOL 100 MG PO TABS Oral Take 100 mg by mouth daily.    Marland Kitchen CLONAZEPAM 0.5 MG PO TABS Oral Take 0.5 mg by mouth daily. For anxiety    . DICLOFENAC SODIUM 1 % TD GEL Topical Apply 1 application topically 4 (four) times daily as needed. For pain    . ESOMEPRAZOLE MAGNESIUM 40 MG PO CPDR Oral Take 40 mg by mouth daily.    Marland Kitchen FERROUS SULFATE 325 (65 FE) MG PO TABS Oral Take 325 mg by mouth 2 (two) times daily with a meal.    . FLUOXETINE HCL 20 MG PO CAPS Oral Take 60 mg by mouth daily.     Marland Kitchen FLUTICASONE PROPIONATE  HFA 220 MCG/ACT IN AERO Inhalation Inhale 1 puff into the lungs 2 (two) times daily as needed. Cough/shortness of  breath    . FUROSEMIDE 40 MG PO TABS Oral Take 40 mg by mouth daily as needed. For increased fluid in legs    . GABAPENTIN 100 MG PO CAPS Oral Take 200 mg by mouth at bedtime as needed. For pain    . HYDROCHLOROTHIAZIDE 25 MG PO TABS Oral Take 25 mg by mouth daily. For high blood pressure     . MECLIZINE HCL 25 MG PO TABS Oral Take 25 mg by mouth every 8 (eight) hours as needed. For vertigo    . POTASSIUM CHLORIDE CRYS ER 20 MEQ PO TBCR Oral Take 20 mEq by mouth daily.      . TRAMADOL HCL 50 MG PO TABS Oral Take 1 tablet (50 mg total) by mouth every 6 (six) hours as needed. For pain. 30 tablet 0  . TRIAMCINOLONE ACETONIDE 0.1 % EX OINT Topical Apply topically 2 (two) times daily. 80 g 0  . VITAMIN D (ERGOCALCIFEROL) 50000 UNITS PO CAPS Oral Take 50,000 Units by mouth every 7 (seven) days.    . WARFARIN SODIUM 5 MG PO TABS  TAKE 1 TABLET BY MOUTH EVERY DAY OR USE AS DIRECTED 30  tablet 4  . ZOLPIDEM TARTRATE 5 MG PO TABS Oral Take 1 tablet (5 mg total) by mouth at bedtime as needed for sleep. 30 tablet 0    BP 105/69  Pulse 79  Temp(Src) 98.3 F (36.8 C) (Oral)  Resp 18  Wt 240 lb (108.863 kg)  SpO2 97%  Physical Exam  Nursing note and vitals reviewed. Constitutional: She is oriented to person, place, and time. She appears well-developed and well-nourished. No distress.  HENT:  Head: Normocephalic and atraumatic.  Mouth/Throat: Oropharynx is clear and moist. No oropharyngeal exudate.  Eyes: Conjunctivae are normal.  Neck: Neck supple.  Cardiovascular: Normal rate and regular rhythm.   Pulmonary/Chest: Effort normal. No respiratory distress. She has no wheezes.  Abdominal: Soft. There is no tenderness.  Musculoskeletal: Normal range of motion. She exhibits no edema and no tenderness.  Neurological: She is alert and oriented to person, place, and time.  Skin: Skin is warm. Rash noted.       Multiple nodular, erythematous rash noted to lower extremities in various size. Excoriation Mark noted. Nonpustular, nonvesicular. No obvious petechial manifestation.    ED Course  Procedures (including critical care time)  Labs Reviewed - No data to display No results found.   No diagnosis found.    MDM  Patient presents with several erythematous rash with excoriation marks  to her lower extremity.  The rash resemble prurigo nodularis.  However, i am unable to differentiate rash fully.  No fever, headache, oral mucosal involvement.  No rash to palms of hands or sole of feet.  Pt in NAD.  Since she has responded to prior treatment with vistaril and prednisone, and she attend her coumadin clinic on a regular basis i will give same treatment this time.  Pt agrees to f/u with her PCP and with dermatologist for further care.  VSS        Fayrene Helper, PA-C 02/15/12 1645

## 2012-02-15 NOTE — ED Notes (Signed)
Pt left without being escorted to discharge desk

## 2012-02-15 NOTE — Discharge Instructions (Signed)
Please follow up with a dermatologist for further evaluation of your rash.    Rash A rash is a change in the color or texture of your skin. There are many different types of rashes. You may have other problems that accompany your rash. CAUSES   Infections.   Allergic reactions. This can include allergies to pets or foods.   Certain medicines.   Exposure to certain chemicals, soaps, or cosmetics.   Heat.   Exposure to poisonous plants.   Tumors, both cancerous and noncancerous.  SYMPTOMS   Redness.   Scaly skin.   Itchy skin.   Dry or cracked skin.   Bumps.   Blisters.   Pain.  DIAGNOSIS  Your caregiver may do a physical exam to determine what type of rash you have. A skin sample (biopsy) may be taken and examined under a microscope. TREATMENT  Treatment depends on the type of rash you have. Your caregiver may prescribe certain medicines. For serious conditions, you may need to see a skin doctor (dermatologist). HOME CARE INSTRUCTIONS   Avoid the substance that caused your rash.   Do not scratch your rash. This can cause infection.   You may take cool baths to help stop itching.   Only take over-the-counter or prescription medicines as directed by your caregiver.   Keep all follow-up appointments as directed by your caregiver.  SEEK IMMEDIATE MEDICAL CARE IF:  You have increasing pain, swelling, or redness.   You have a fever.   You have new or severe symptoms.   You have body aches, diarrhea, or vomiting.   Your rash is not better after 3 days.  MAKE SURE YOU:  Understand these instructions.   Will watch your condition.   Will get help right away if you are not doing well or get worse.  Document Released: 08/31/2002 Document Revised: 08/30/2011 Document Reviewed: 06/25/2011 [facility] Patient Information 2012 [place], [place].

## 2012-02-15 NOTE — ED Notes (Addendum)
Pt states "I been messing with this rash for over a month, been here & to my medical doctor, when I stopped taking the prednisone it came right back"; pt indicates rash is to LE's bilaterally.

## 2012-02-21 ENCOUNTER — Ambulatory Visit: Payer: Medicare Other

## 2012-02-21 ENCOUNTER — Other Ambulatory Visit: Payer: Medicare Other | Admitting: Lab

## 2012-02-21 ENCOUNTER — Telehealth: Payer: Self-pay | Admitting: Pharmacist

## 2012-02-21 NOTE — Telephone Encounter (Signed)
Called and left vm on patient's phone to reschedule today's missed appt.

## 2012-02-21 NOTE — ED Provider Notes (Signed)
Medical screening examination/treatment/procedure(s) were performed by non-physician practitioner and as supervising physician I was immediately available for consultation/collaboration.   Hurman Horn, MD 02/21/12 [phone]

## 2012-02-23 ENCOUNTER — Emergency Department (HOSPITAL_COMMUNITY)
Admission: EM | Admit: 2012-02-23 | Discharge: 2012-02-23 | Disposition: A | Discharge status: Home / Self Care | Payer: Medicare Other | Attending: Emergency Medicine | Admitting: Emergency Medicine

## 2012-02-23 ENCOUNTER — Encounter (HOSPITAL_COMMUNITY): Payer: Self-pay | Admitting: *Deleted

## 2012-02-23 DIAGNOSIS — M545 Low back pain, unspecified: Secondary | ICD-10-CM | POA: Insufficient documentation

## 2012-02-23 DIAGNOSIS — Z86711 Personal history of pulmonary embolism: Secondary | ICD-10-CM | POA: Insufficient documentation

## 2012-02-23 DIAGNOSIS — E785 Hyperlipidemia, unspecified: Secondary | ICD-10-CM | POA: Insufficient documentation

## 2012-02-23 DIAGNOSIS — I10 Essential (primary) hypertension: Secondary | ICD-10-CM | POA: Insufficient documentation

## 2012-02-23 DIAGNOSIS — K219 Gastro-esophageal reflux disease without esophagitis: Secondary | ICD-10-CM | POA: Insufficient documentation

## 2012-02-23 DIAGNOSIS — Z981 Arthrodesis status: Secondary | ICD-10-CM | POA: Insufficient documentation

## 2012-02-23 DIAGNOSIS — F209 Schizophrenia, unspecified: Secondary | ICD-10-CM | POA: Insufficient documentation

## 2012-02-23 DIAGNOSIS — G8929 Other chronic pain: Secondary | ICD-10-CM | POA: Insufficient documentation

## 2012-02-23 DIAGNOSIS — D509 Iron deficiency anemia, unspecified: Secondary | ICD-10-CM | POA: Insufficient documentation

## 2012-02-23 DIAGNOSIS — Z86718 Personal history of other venous thrombosis and embolism: Secondary | ICD-10-CM | POA: Insufficient documentation

## 2012-02-23 DIAGNOSIS — G4730 Sleep apnea, unspecified: Secondary | ICD-10-CM | POA: Insufficient documentation

## 2012-02-23 DIAGNOSIS — M129 Arthropathy, unspecified: Secondary | ICD-10-CM | POA: Insufficient documentation

## 2012-02-23 MED ORDER — OXYCODONE-ACETAMINOPHEN 5-325 MG PO TABS: 1.0000 | ORAL_TABLET | Freq: Four times a day (QID) | ORAL | Status: DC | PRN

## 2012-02-23 MED ORDER — HYDROMORPHONE HCL PF 1 MG/ML IJ SOLN
1.0000 mg | Freq: Once | INTRAMUSCULAR | Status: AC
  Administered 2012-02-23: 1 mg via INTRAMUSCULAR
  Filled 2012-02-23: qty 1

## 2012-02-23 NOTE — ED Notes (Signed)
The pt is c/o lower back pain for one week.  Worse since yesterday when she stayed all day with a family member.  She has vertigo and she has been dizzy all day.  C/o sob  None now

## 2012-02-23 NOTE — ED Notes (Signed)
Patient is AOx4 and comfortable with her discharge instructions. 

## 2012-02-23 NOTE — ED Provider Notes (Signed)
History     CSN: [id number]  Arrival date & time 02/23/12  2001   First MD Initiated Contact with Patient 02/23/12 2141      Chief Complaint  Patient presents with  . Back Pain    (Consider location/radiation/quality/duration/timing/severity/associated sxs/prior treatment) Patient is a 71 y.o. female presenting with back pain. The history is provided by the patient.  Back Pain  This is a new problem. The current episode started yesterday. The problem occurs constantly. The problem has not changed since onset.The pain is associated with no known injury. The pain is present in the lumbar spine. The quality of the pain is described as aching. The pain does not radiate. The pain is moderate. The symptoms are aggravated by bending. The pain is the same all the time. Pertinent negatives include no chest pain, no fever, no headaches, no abdominal pain and no dysuria. Treatments tried: tramadol. The treatment provided mild relief. Risk factors include obesity.    Past Medical History  Diagnosis Date  . Depression   . Schizophrenia   . Arthritis   . Hyperlipidemia   . Hypertension   . Chronic pain   . 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 DVT (deep venous thrombosis)     per 01/17/10 d/c summary- "remote hx of dvt"?  . Iron deficiency anemia   . Chronic headaches   . Sleep apnea   . Glaucoma   . Hemorrhoids   . Asthma   . Anxiety   . GERD (gastroesophageal reflux disease)     Past Surgical History  Procedure Date  . Spinal fusion     2004  . Joint replacement right knee 11/2008  . Tubal ligation year 67  . Carpal tunnel release     bilateral     Family History  Problem Relation Age of Onset  . Diabetes Sister   . Colon cancer Brother 40    History  Substance Use Topics  . Smoking status: Never Smoker   . Smokeless tobacco: Never Used  . Alcohol Use: No    OB History    Grav Para Term Preterm Abortions TAB SAB Ect Mult Living        Review of Systems  Constitutional: Negative for fever and fatigue.  HENT: Negative for congestion, drooling and neck pain.   Eyes: Negative for pain.  Respiratory: Negative for cough and shortness of breath.   Cardiovascular: Negative for chest pain.  Gastrointestinal: Negative for nausea, vomiting, abdominal pain and diarrhea.  Genitourinary: Negative for dysuria and hematuria.  Musculoskeletal: Positive for back pain. Negative for gait problem.  Skin: Negative for color change.  Neurological: Negative for dizziness and headaches.  Hematological: Negative for adenopathy.  Psychiatric/Behavioral: Negative for behavioral problems.  All other systems reviewed and are negative.    Allergies  Adhesive  Home Medications   Current Outpatient Rx  Name Route Sig Dispense Refill  . ALBUTEROL SULFATE HFA 108 (90 BASE) MCG/ACT IN AERS Inhalation Inhale 2 puffs into the lungs 2 (two) times daily.     . ATENOLOL 100 MG PO TABS Oral Take 100 mg by mouth daily.    Marland Kitchen CLONAZEPAM 0.5 MG PO TABS Oral Take 0.5 mg by mouth daily. For anxiety    . DICLOFENAC SODIUM 1 % TD GEL Topical Apply 1 application topically 4 (four) times daily as needed. For pain    . ESOMEPRAZOLE MAGNESIUM 40 MG PO CPDR Oral Take 40 mg by mouth daily.    Marland Kitchen  FERROUS SULFATE 325 (65 FE) MG PO TABS Oral Take 325 mg by mouth 2 (two) times daily with a meal.    . FLUOXETINE HCL 20 MG PO CAPS Oral Take 60 mg by mouth daily.     Marland Kitchen FLUTICASONE PROPIONATE  HFA 220 MCG/ACT IN AERO Inhalation Inhale 1 puff into the lungs 2 (two) times daily as needed. Cough/shortness of breath    . FUROSEMIDE 40 MG PO TABS Oral Take 40 mg by mouth daily as needed. For increased fluid in legs    . GABAPENTIN 100 MG PO CAPS Oral Take 200 mg by mouth at bedtime as needed. For pain    . HYDROCHLOROTHIAZIDE 25 MG PO TABS Oral Take 25 mg by mouth daily. For high blood pressure     . HYDROXYZINE HCL 25 MG PO TABS Oral Take 25 mg by mouth every 6 (six)  hours.    Marland Kitchen MECLIZINE HCL 25 MG PO TABS Oral Take 25 mg by mouth every 8 (eight) hours as needed. For vertigo    . PERMETHRIN 5 % EX CREA Topical Apply 1 application topically daily.    Marland Kitchen POTASSIUM CHLORIDE CRYS ER 20 MEQ PO TBCR Oral Take 20 mEq by mouth daily.      . TRAMADOL HCL 50 MG PO TABS Oral Take 50 mg by mouth every 6 (six) hours as needed. For pain    . TRIAMCINOLONE ACETONIDE 0.1 % EX OINT Topical Apply 1 application topically 2 (two) times daily.    Marland Kitchen VITAMIN D (ERGOCALCIFEROL) 50000 UNITS PO CAPS Oral Take 50,000 Units by mouth every 7 (seven) days.    . WARFARIN SODIUM 5 MG PO TABS Oral Take 5 mg by mouth daily.    . OXYCODONE-ACETAMINOPHEN 5-325 MG PO TABS Oral Take 1 tablet by mouth every 6 (six) hours as needed for pain. 15 tablet 0    BP 126/65  Pulse 56  Temp(Src) 98.4 F (36.9 C) (Oral)  Resp 16  SpO2 99%  Physical Exam  Constitutional: She is oriented to person, place, and time. She appears well-developed and well-nourished.  HENT:  Head: Normocephalic.  Mouth/Throat: No oropharyngeal exudate.  Eyes: Conjunctivae and EOM are normal. Pupils are equal, round, and reactive to light.  Neck: Normal range of motion. Neck supple.  Cardiovascular: Normal rate, regular rhythm, normal heart sounds and intact distal pulses.  Exam reveals no gallop and no friction rub.   No murmur heard. Pulmonary/Chest: Effort normal and breath sounds normal. No respiratory distress. She has no wheezes.  Abdominal: Soft. Bowel sounds are normal. There is no tenderness.  Musculoskeletal: Normal range of motion. She exhibits no edema and no tenderness.       Low back pain is not reproducible with palpation. No vertebral ttp noted.  Neurological: She is alert and oriented to person, place, and time. She has normal strength. No sensory deficit. Gait normal.       Pt ambulates w/out difficulty.  Skin: Skin is warm and dry.  Psychiatric: She has a normal mood and affect. Her behavior is normal.     ED Course  Procedures (including critical care time)  Labs Reviewed - No data to display No results found.   1. Acute exacerbation of chronic low back pain      Date: 02/24/2012  Rate: 70  Rhythm: normal sinus rhythm  QRS Axis: normal  Intervals: normal  ST/T Wave abnormalities: normal  Conduction Disutrbances:none  Narrative Interpretation: No ST or T wave changes cw ischemia  Old EKG Reviewed: unchanged    MDM  12:38 AM 71 y.o. female w hx of chronic low back pain w/ acute worsening of her back pain. Pt notes that she was on her feet most of the day yesterday and has had worsening of her chronic back pain and mild worsening of her vertigo since then. Pt AFVSS here, appears well on exam, ambulates w/out difficulty. Pt notes that this pain is not different from her usual back pain, just worse. She notes it is cw previous exacerbations. Will get pain control w/ IM dilaudid.     12:38 AM: Pt feeling much better on exam. I have discussed the diagnosis/risks/treatment options with the patient and believe the pt to be eligible for discharge home to follow-up with pcp in 2-3 days if no better. We also discussed returning to the ED immediately if new or worsening sx occur. We discussed the sx which are most concerning (e.g., worsening back pain) that necessitate immediate return. Any new prescriptions provided to the patient are listed below.  New Prescriptions   OXYCODONE-ACETAMINOPHEN (PERCOCET) 5-325 MG PER TABLET    Take 1 tablet by mouth every 6 (six) hours as needed for pain.   Clinical Impression 1. Acute exacerbation of chronic low back pain        Purvis Sheffield, MD 02/24/12 [phone]

## 2012-02-24 NOTE — ED Provider Notes (Signed)
I saw and evaluated the patient, reviewed the resident's note and I agree with the findings and plan. I saw EKG And agree with resident interpretation. Pt with chronic back pain which has been exacerbated and no neuro complaints.  NV intact on exam.  Gwyneth Sprout, MD 02/24/12 1352

## 2012-02-27 ENCOUNTER — Encounter: Payer: Self-pay | Admitting: Pharmacist

## 2012-02-27 NOTE — Progress Notes (Signed)
Pt left vm on Coumadin clinic phone that she has been sick & missed her clinic appt.  She would like to reschedule. I called pt at home & she informed me that she has had infection in her blood.  Her legs are "dark" and have "spots & knots"; her "drumstick broke out."  She has been to urgent care & [facility] ED where she was told they were bug bites & possibly due to varicose veins.  She also went to [facility].  She has a f/u appt there on 6/28 w/ a specialist. She states she has been on Prednisone twice recently but the "scratchy rash" comes back.  She was also given a cream for itching (Permethrin). I have scheduled for her to have her INR checked tomorrow given recent Prednisone tx.  Marily Lente, Pharm.D.

## 2012-02-28 ENCOUNTER — Ambulatory Visit: Payer: Medicare Other

## 2012-02-28 ENCOUNTER — Encounter (HOSPITAL_COMMUNITY): Payer: Self-pay | Admitting: Emergency Medicine

## 2012-02-28 ENCOUNTER — Other Ambulatory Visit: Payer: Medicare Other | Admitting: Lab

## 2012-02-28 ENCOUNTER — Emergency Department (HOSPITAL_COMMUNITY)
Admission: EM | Admit: 2012-02-28 | Discharge: 2012-02-28 | Disposition: A | Discharge status: Home / Self Care | Payer: Medicare Other | Attending: Emergency Medicine | Admitting: Emergency Medicine

## 2012-02-28 DIAGNOSIS — S335XXA Sprain of ligaments of lumbar spine, initial encounter: Secondary | ICD-10-CM | POA: Insufficient documentation

## 2012-02-28 DIAGNOSIS — E785 Hyperlipidemia, unspecified: Secondary | ICD-10-CM | POA: Insufficient documentation

## 2012-02-28 DIAGNOSIS — X500XXA Overexertion from strenuous movement or load, initial encounter: Secondary | ICD-10-CM | POA: Insufficient documentation

## 2012-02-28 DIAGNOSIS — Z86711 Personal history of pulmonary embolism: Secondary | ICD-10-CM | POA: Insufficient documentation

## 2012-02-28 DIAGNOSIS — K219 Gastro-esophageal reflux disease without esophagitis: Secondary | ICD-10-CM | POA: Insufficient documentation

## 2012-02-28 DIAGNOSIS — Y998 Other external cause status: Secondary | ICD-10-CM | POA: Insufficient documentation

## 2012-02-28 DIAGNOSIS — G4730 Sleep apnea, unspecified: Secondary | ICD-10-CM | POA: Insufficient documentation

## 2012-02-28 DIAGNOSIS — Z86718 Personal history of other venous thrombosis and embolism: Secondary | ICD-10-CM | POA: Insufficient documentation

## 2012-02-28 DIAGNOSIS — S39012A Strain of muscle, fascia and tendon of lower back, initial encounter: Secondary | ICD-10-CM

## 2012-02-28 DIAGNOSIS — Y9389 Activity, other specified: Secondary | ICD-10-CM | POA: Insufficient documentation

## 2012-02-28 DIAGNOSIS — I10 Essential (primary) hypertension: Secondary | ICD-10-CM | POA: Insufficient documentation

## 2012-02-28 DIAGNOSIS — F209 Schizophrenia, unspecified: Secondary | ICD-10-CM | POA: Insufficient documentation

## 2012-02-28 DIAGNOSIS — G8929 Other chronic pain: Secondary | ICD-10-CM | POA: Insufficient documentation

## 2012-02-28 DIAGNOSIS — D509 Iron deficiency anemia, unspecified: Secondary | ICD-10-CM | POA: Insufficient documentation

## 2012-02-28 MED ORDER — HYDROMORPHONE HCL PF 1 MG/ML IJ SOLN
1.0000 mg | Freq: Once | INTRAMUSCULAR | Status: AC
  Administered 2012-02-28: 1 mg via INTRAMUSCULAR
  Filled 2012-02-28: qty 1

## 2012-02-28 NOTE — ED Provider Notes (Signed)
History     CSN: [id number]  Arrival date & time 02/28/12  7322   First MD Initiated Contact with Patient 02/28/12 [phone]      Chief Complaint  Patient presents with  . Back Pain    (Consider location/radiation/quality/duration/timing/severity/associated sxs/prior treatment) HPI Comments: Patient has an adult son with a seizure disorder, and mental retardation that had a seizure.  Tonight.  She attempted to lift him off.  The floor and hurt her back.  This is a recurrent problem.  For this pleasant female.  Since, your right, the ambulance with her son after a seizure.  She has not taken any of her normal.  Pain medication  Patient is a 71 y.o. female presenting with back pain. The history is provided by the patient.  Back Pain  This is a recurrent problem. The current episode started 1 to 2 hours ago. The problem occurs constantly. The problem has not changed since onset.The pain is associated with lifting heavy objects. The pain is present in the lumbar spine. The pain is at a severity of 4/10. The pain is moderate. The symptoms are aggravated by certain positions. Pertinent negatives include no fever and no numbness.    Past Medical History  Diagnosis Date  . Depression   . Schizophrenia   . Arthritis   . Hyperlipidemia   . Hypertension   . Chronic pain   . 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 DVT (deep venous thrombosis)     per 01/17/10 d/c summary- "remote hx of dvt"?  . Iron deficiency anemia   . Chronic headaches   . Sleep apnea   . Glaucoma   . Hemorrhoids   . Asthma   . Anxiety   . GERD (gastroesophageal reflux disease)     Past Surgical History  Procedure Date  . Spinal fusion     2004  . Joint replacement right knee 11/2008  . Tubal ligation year 14  . Carpal tunnel release     bilateral     Family History  Problem Relation Age of Onset  . Diabetes Sister   . Colon cancer Brother 40    History  Substance Use Topics  .  Smoking status: Never Smoker   . Smokeless tobacco: Never Used  . Alcohol Use: No    OB History    Grav Para Term Preterm Abortions TAB SAB Ect Mult Living                  Review of Systems  Constitutional: Negative for fever and chills.  Gastrointestinal: Negative for nausea.  Musculoskeletal: Positive for back pain.  Neurological: Negative for dizziness and numbness.    Allergies  Adhesive  Home Medications   Current Outpatient Rx  Name Route Sig Dispense Refill  . ALBUTEROL SULFATE HFA 108 (90 BASE) MCG/ACT IN AERS Inhalation Inhale 2 puffs into the lungs 2 (two) times daily.     . ATENOLOL 100 MG PO TABS Oral Take 100 mg by mouth daily.    Marland Kitchen CLONAZEPAM 0.5 MG PO TABS Oral Take 0.5 mg by mouth 3 (three) times daily as needed. For anxiety    . DICLOFENAC SODIUM 1 % TD GEL Topical Apply 1 application topically 4 (four) times daily as needed. For pain    . ESOMEPRAZOLE MAGNESIUM 40 MG PO CPDR Oral Take 40 mg by mouth daily.    Marland Kitchen FERROUS SULFATE 325 (65 FE) MG PO TABS Oral Take 325  mg by mouth 2 (two) times daily with a meal.    . FLUOXETINE HCL 20 MG PO CAPS Oral Take 60 mg by mouth daily.     . FUROSEMIDE 40 MG PO TABS Oral Take 40 mg by mouth daily as needed. For increased fluid in legs    . GABAPENTIN 100 MG PO CAPS Oral Take 200 mg by mouth at bedtime as needed. For pain    . HYDROCHLOROTHIAZIDE 25 MG PO TABS Oral Take 25 mg by mouth daily. For high blood pressure     . MECLIZINE HCL 25 MG PO TABS Oral Take 25 mg by mouth every 8 (eight) hours as needed. For vertigo    . OXYCODONE-ACETAMINOPHEN 5-325 MG PO TABS Oral Take 1 tablet by mouth every 6 (six) hours as needed for pain. 15 tablet 0  . PERMETHRIN 5 % EX CREA Topical Apply 1 application topically daily.    Marland Kitchen POTASSIUM CHLORIDE CRYS ER 20 MEQ PO TBCR Oral Take 20 mEq by mouth daily.      . TRIAMCINOLONE ACETONIDE 0.1 % EX OINT Topical Apply 1 application topically 2 (two) times daily.    Marland Kitchen VITAMIN D (ERGOCALCIFEROL)  50000 UNITS PO CAPS Oral Take 50,000 Units by mouth every 7 (seven) days. Takes on Sunday    . WARFARIN SODIUM 5 MG PO TABS Oral Take 5 mg by mouth daily.      BP 120/71  Pulse 84  Temp(Src) 98.3 F (36.8 C) (Oral)  Resp 20  SpO2 96%  Physical Exam  Constitutional: She is oriented to person, place, and time. She appears well-developed and well-nourished.  HENT:  Head: Normocephalic.  Eyes: Pupils are equal, round, and reactive to light.  Neck: Normal range of motion.  Cardiovascular: Normal rate.   Pulmonary/Chest: Effort normal.  Musculoskeletal:       Arms: Neurological: She is alert and oriented to person, place, and time.  Skin: Skin is warm.    ED Course  Procedures (including critical care time)  Labs Reviewed - No data to display No results found.   1. Lumbar strain    After patient received IM injection of Dilaudid she was better with less pain She will  followup with her  Physician this week for further treatment   MDM   Recurrent low back lifting her        Arman Filter, NP 02/28/12 [phone]

## 2012-02-28 NOTE — ED Notes (Signed)
PT. REPORTS LOW BACK PAIN ONSET THIS EVENING , STATES PAIN ( CHRONIC) GOT WORSE AFTER ASSISTING HER SON  " PULLING HIM " THIS EVENING .

## 2012-02-28 NOTE — ED Notes (Signed)
Rx x 0, voiced understanding to f/u with PCP

## 2012-02-28 NOTE — Discharge Instructions (Signed)

## 2012-02-28 NOTE — ED Provider Notes (Signed)
Medical screening examination/treatment/procedure(s) were performed by non-physician practitioner and as supervising physician I was immediately available for consultation/collaboration.  Rondale Nies M Codee Bloodworth, MD 02/28/12 0716 

## 2012-03-01 ENCOUNTER — Encounter (HOSPITAL_COMMUNITY): Payer: Self-pay | Admitting: *Deleted

## 2012-03-01 ENCOUNTER — Emergency Department (HOSPITAL_COMMUNITY)
Admission: EM | Admit: 2012-03-01 | Discharge: 2012-03-01 | Disposition: A | Discharge status: Home / Self Care | Payer: Medicare Other | Attending: Emergency Medicine | Admitting: Emergency Medicine

## 2012-03-01 DIAGNOSIS — F209 Schizophrenia, unspecified: Secondary | ICD-10-CM | POA: Insufficient documentation

## 2012-03-01 DIAGNOSIS — Z79899 Other long term (current) drug therapy: Secondary | ICD-10-CM | POA: Insufficient documentation

## 2012-03-01 DIAGNOSIS — M549 Dorsalgia, unspecified: Secondary | ICD-10-CM | POA: Insufficient documentation

## 2012-03-01 DIAGNOSIS — G4730 Sleep apnea, unspecified: Secondary | ICD-10-CM | POA: Insufficient documentation

## 2012-03-01 DIAGNOSIS — M129 Arthropathy, unspecified: Secondary | ICD-10-CM | POA: Insufficient documentation

## 2012-03-01 DIAGNOSIS — I10 Essential (primary) hypertension: Secondary | ICD-10-CM | POA: Insufficient documentation

## 2012-03-01 DIAGNOSIS — Z86718 Personal history of other venous thrombosis and embolism: Secondary | ICD-10-CM | POA: Insufficient documentation

## 2012-03-01 DIAGNOSIS — E785 Hyperlipidemia, unspecified: Secondary | ICD-10-CM | POA: Insufficient documentation

## 2012-03-01 DIAGNOSIS — Z86711 Personal history of pulmonary embolism: Secondary | ICD-10-CM | POA: Insufficient documentation

## 2012-03-01 DIAGNOSIS — D509 Iron deficiency anemia, unspecified: Secondary | ICD-10-CM | POA: Insufficient documentation

## 2012-03-01 DIAGNOSIS — G8929 Other chronic pain: Secondary | ICD-10-CM | POA: Insufficient documentation

## 2012-03-01 DIAGNOSIS — K219 Gastro-esophageal reflux disease without esophagitis: Secondary | ICD-10-CM | POA: Insufficient documentation

## 2012-03-01 MED ORDER — MORPHINE SULFATE 4 MG/ML IJ SOLN
4.0000 mg | Freq: Once | INTRAMUSCULAR | Status: AC
  Administered 2012-03-01: 4 mg via INTRAMUSCULAR
  Filled 2012-03-01: qty 1

## 2012-03-01 NOTE — ED Notes (Signed)
C/o lower back pain for one week.  She has a mentally challenged adult son she has to pull on at home.  Unable to get rellief

## 2012-03-01 NOTE — Discharge Instructions (Signed)
Please continue your home pain medications for your pain and symptoms. Please also followup with your back specialist for continued evaluation and treatment of your symptoms. Return to emergency room for any worsening or changing symptoms.   Chronic Back Pain When back pain lasts longer than 3 months, it is called chronic back pain.This pain can be frustrating, but the cause of the pain is rarely dangerous.People with chronic back pain often go through certain periods that are more intense (flare-ups). CAUSES Chronic back pain can be caused by wear and tear (degeneration) on different structures in your back. These structures may include bones, ligaments, or discs. This degeneration may result in more pressure being placed on the nerves that travel to your legs and feet. This can lead to pain traveling from the low back down the back of the legs. When pain lasts longer than 3 months, it is not unusual for people to experience anxiety or depression. Anxiety and depression can also contribute to low back pain. TREATMENT  Establish a regular exercise plan. This is critical to improving your functional level.   Have a self-management plan for when you flare-up. Flare-ups rarely require a medical visit. Regular exercise will help reduce the intensity and frequency of your flare-ups.   Manage how you feel about your back pain and the rest of your life. Anxiety, depression, and feeling that you cannot alter your back pain have been shown to make back pain more intense and debilitating.   Medicines should never be your only treatment. They should be used along with other treatments to help you return to a more active lifestyle.   Procedures such as injections or surgery may be helpful but are rarely necessary. You may be able to get the same results with physical therapy or chiropractic care.  HOME CARE INSTRUCTIONS  Avoid bending, heavy lifting, prolonged sitting, and activities which make the problem  worse.   Continue normal activity as much as possible.   Take brief periods of rest throughout the day to reduce your pain during flare-ups.   Follow your back exercise rehabilitation program. This can help reduce symptoms and prevent more pain.   Only take over-the-counter or prescription medicines as directed by your caregiver. Muscle relaxants are sometimes prescribed. Narcotic pain medicine is discouraged for long-term pain, since addiction is a possible outcome.   If you smoke, quit.   Eat healthy foods and maintain a recommended body weight.  SEEK IMMEDIATE MEDICAL CARE IF:   You have weakness or numbness in one of your legs or feet.   You have trouble controlling your bladder or bowels.   You develop nausea, vomiting, abdominal pain, shortness of breath, or fainting.  Document Released: 10/18/2004 Document Revised: 08/30/2011 Document Reviewed: 08/25/2011 [facility] Patient Information 2012 [place], [place].    RESOURCE GUIDE  Chronic Pain Problems: Contact [facility]  [phone] Patients need to be referred by their primary care doctor.  Insufficient Money for Medicine: Contact United Way:  call "211" or [facility] [phone].  No Primary Care Doctor: - Call [facility] Connect  [phone] - can help you locate a primary care doctor that  accepts your insurance, provides certain services, etc. - Physician Referral Service- [phone]  Agencies that provide inexpensive medical care: - [facility]  [phone] - [facility]  [phone] - [facility]  [phone] - Women's Clinic  [phone] - Planned Parenthood  [phone] [facility]  [phone]  Medicaid-accepting [facility] Providers: - [facility]- [address]  [phone], Mon-Fri 9am-7pm, Sat 9am-1pm - [facility]- [address] [place], [place]  [phone] - [facility]- [address], [address] [place]  [phone] [facility]- [address]  [phone] - Renaye Rakers- [address] [place], [address], [phone]  Only accepts [place] patients after they have their name  applied to their card  Self Pay (no insurance) in [place]: - Sickle Cell Patients: Dr Willey Blade, [facility]  [address] [place], [phone] - [facility]- [address] [place]  [phone]       [facility] [place]- [address]       -     [facility]- see information above (Speak to [employer] if you do not have insurance)       -  [facility]- [address] [place], [phone]       -  [facility]- [address],  [phone]       -  [facility]- [address], [phone]       -  Dr Julio Sicks-  [address], [place], [phone]       -  [facility]- [address], [phone]       -  [facility]- [address], [phone], also [address] [phone]       -    [facility]- [address] [place], [phone], 1st & 3rd Saturday   every month, 10am-1pm  1) Find a Doctor and Pay Out of Pocket Although you won't have to find out who is covered by your insurance plan, it is a good idea to ask around and get recommendations. You will then need to call the office and see if the doctor you have chosen will accept you as a new patient and what types of options they offer for patients who are self-pay. Some doctors offer discounts or will set up payment plans for their patients who do not have insurance, but you will need to ask so you aren't surprised when you get to your appointment.  2) Contact Your Local Health Department Not all health departments have doctors that can see patients for sick visits, but many do, so it is worth a call to see if yours does. If you don't know where your local  health department is, you can check in your phone book. The CDC also has a tool to help you locate your state's health department, and many state websites also have listings of all of their local health departments.  3) Find a Walk-in Clinic If your illness is not likely to be very severe or complicated, you may want to try a walk in clinic. These are popping up all over the country in pharmacies, drugstores, and shopping centers. They're usually staffed by nurse practitioners or physician assistants that have been trained to treat common illnesses and complaints. They're usually fairly quick and inexpensive. However, if you have serious medical issues or chronic medical problems, these are probably not your best option  STD Testing - [facility] Department of [facility], STD Clinic, [address], [place], phone [phone] or [phone].  Monday - Friday, call for  an appointment. [facility] Department of [employer], STD Clinic, [address], [place], phone [phone] or [phone].  Monday - Friday, call for an appointment.  Abuse/Neglect: [facility] Child Abuse Hotline (646)067-6492 [facility] Child Abuse Hotline 773 026 7640 (After Hours)  Emergency Shelter:  [facility] 5408556433  Maternity Homes: - [facility] 762-289-1189 - [facility] [phone]  MRSA Hotline #:   843 435 4926  [facility] Resources  [facility] [place]  United Way [facility] Dept. [address]                 [address]         [address] [place] [address]  Blondell Reveal Phone:  [phone]                                  Phone:  [phone]                   Phone:  [phone]  [facility], [phone] - [facility] - [facility]  [phone]       -     [facility] in [place], [address],                                  [phone], [facility] Child Abuse Hotline (915)146-3922 or (580)235-9286 (After Hours)   Behavioral Health Services  Substance Abuse Resources: - [facility] Drug Services  8184636526 - [facility] [phone] - [facility] [place] [phone] Floydene Flock [phone] - Residential & Outpatient Substance Abuse Program  ([phone]  Psychological Services: [facility]  [phone] Services  ([phone] - [facility], [phone] [place]. [address] [place], ACCESS LINE: 629-204-8406 or (612)039-8354, [employer]za  Dental Assistance  If unable to pay or uninsured, contact:  [facility] or [facility]. to become qualified for the adult dental clinic.  Patients with Medicaid: [facility] [phone] [address], [phone] [address] [phone]  If unable to pay, or uninsured, contact [facility] [phone]) or [facility] Department [phone] in [place], [phone] in [facility]) to become qualified for the adult dental clinic  Other Low-Cost Community Dental Services: - [facility]- [address] [place], [place], [place], [address], [phone], [phone], 2nd and 4th Thursday of the month at 6:30am.  10 clients each day by appointment, can sometimes see walk-in patients if someone does not show for an appointment. [facility]- [address] Ether Griffins [place], [place], [address], [phone] - [facility]- [address], [place], [place], [address], [phone] - [facility]- [phone] [facility] Health Department- [phone] [facility] Department- [phone]

## 2012-03-01 NOTE — ED Provider Notes (Signed)
Medical screening examination/treatment/procedure(s) were performed by non-physician practitioner and as supervising physician I was immediately available for consultation/collaboration.   Lyanne Co, MD 03/01/12 [phone]

## 2012-03-01 NOTE — ED Provider Notes (Signed)
History     CSN: [id number]  Arrival date & time 03/01/12  0126   First MD Initiated Contact with Patient 03/01/12 0153      Chief Complaint  Patient presents with  . Back Pain    HPI  History provided by the patient. Patient is a 71 year old female with history of chronic back pains who presents with complaints of persistent low back pain for the past week. Patient states she was seen and evaluated for similar symptoms one week ago. She was treated with medications in the emergency room. Patient states that she is planning to followup with her orthopedic back specialists, Dr. Shanon Rosser. She reports that she received back injections in the past which seemed to help for a long time. Patient has not had any injections within the last one to 2 years. Patient states that she originally aggravated her back helping her son who has seizures and fell on the ground. Since that time she occasionally gets sharp "catches"in her back because her to bend forward and make her unable to stand straight up. Patient does report using tramadol a home with some good relief of symptoms. She denies any other symptoms. She denies any numbness or weakness in lower legs. Denies any urinary or fecal incontinence, urinary retention or perineal numbness.    Past Medical History  Diagnosis Date  . Depression   . Schizophrenia   . Arthritis   . Hyperlipidemia   . Hypertension   . Chronic pain   . 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 DVT (deep venous thrombosis)     per 01/17/10 d/c summary- "remote hx of dvt"?  . Iron deficiency anemia   . Chronic headaches   . Sleep apnea   . Glaucoma   . Hemorrhoids   . Asthma   . Anxiety   . GERD (gastroesophageal reflux disease)     Past Surgical History  Procedure Date  . Spinal fusion     2004  . Joint replacement right knee 11/2008  . Tubal ligation year 47  . Carpal tunnel release     bilateral     Family History  Problem  Relation Age of Onset  . Diabetes Sister   . Colon cancer Brother 40    History  Substance Use Topics  . Smoking status: Never Smoker   . Smokeless tobacco: Never Used  . Alcohol Use: No    OB History    Grav Para Term Preterm Abortions TAB SAB Ect Mult Living                  Review of Systems  Constitutional: Negative for fever, chills and unexpected weight change.  Respiratory: Negative for shortness of breath.   Cardiovascular: Negative for chest pain.  Gastrointestinal: Negative for abdominal pain.  Genitourinary: Negative for dysuria, frequency, hematuria and flank pain.    Allergies  Adhesive  Home Medications   Current Outpatient Rx  Name Route Sig Dispense Refill  . ALBUTEROL SULFATE HFA 108 (90 BASE) MCG/ACT IN AERS Inhalation Inhale 2 puffs into the lungs 2 (two) times daily.     . ATENOLOL 100 MG PO TABS Oral Take 100 mg by mouth daily.    Marland Kitchen CLONAZEPAM 0.5 MG PO TABS Oral Take 0.5 mg by mouth 3 (three) times daily as needed. For anxiety    . DICLOFENAC SODIUM 1 % TD GEL Topical Apply 1 application topically 4 (four) times daily as needed. For  pain    . ESOMEPRAZOLE MAGNESIUM 40 MG PO CPDR Oral Take 40 mg by mouth daily.    Marland Kitchen FERROUS SULFATE 325 (65 FE) MG PO TABS Oral Take 325 mg by mouth 2 (two) times daily with a meal.    . FLUOXETINE HCL 20 MG PO CAPS Oral Take 60 mg by mouth daily.     Marland Kitchen FLUTICASONE PROPIONATE  HFA 220 MCG/ACT IN AERO Inhalation Inhale 1 puff into the lungs 2 (two) times daily.    . FUROSEMIDE 40 MG PO TABS Oral Take 40 mg by mouth daily as needed. For increased fluid in legs    . GABAPENTIN 100 MG PO CAPS Oral Take 200 mg by mouth at bedtime as needed. For pain    . HYDROCHLOROTHIAZIDE 25 MG PO TABS Oral Take 25 mg by mouth daily. For high blood pressure     . HYDROXYZINE HCL 25 MG PO TABS Oral Take 25 mg by mouth every 6 (six) hours.    Marland Kitchen MECLIZINE HCL 25 MG PO TABS Oral Take 25 mg by mouth every 8 (eight) hours as needed. For vertigo      . PERMETHRIN 5 % EX CREA Topical Apply 1 application topically daily.    Marland Kitchen POTASSIUM CHLORIDE CRYS ER 20 MEQ PO TBCR Oral Take 20 mEq by mouth daily.      . TRAMADOL HCL 50 MG PO TABS Oral Take 50 mg by mouth 3 (three) times daily as needed. For pain    . TRIAMCINOLONE ACETONIDE 0.1 % EX OINT Topical Apply 1 application topically 2 (two) times daily.    Marland Kitchen VITAMIN D (ERGOCALCIFEROL) 50000 UNITS PO CAPS Oral Take 50,000 Units by mouth every 7 (seven) days. Takes on Sunday    . WARFARIN SODIUM 5 MG PO TABS Oral Take 5 mg by mouth daily.      BP 114/69  Pulse 70  Temp(Src) 98 F (36.7 C) (Oral)  Resp 20  SpO2 96%  Physical Exam  Nursing note and vitals reviewed. Constitutional: She is oriented to person, place, and time. She appears well-developed and well-nourished. No distress.  HENT:  Head: Normocephalic and atraumatic.  Cardiovascular: Normal rate and regular rhythm.   Pulmonary/Chest: Effort normal and breath sounds normal.  Abdominal: Soft. There is no tenderness.  Musculoskeletal:       Lumbar back: She exhibits tenderness.       Back:  Neurological: She is alert and oriented to person, place, and time. No sensory deficit.       Strength equal bilaterally  Skin: Skin is warm and dry. No rash noted.  Psychiatric: She has a normal mood and affect. Her behavior is normal.    ED Course  Procedures    1. Chronic back pain       MDM  Patient seen and evaluated. Patient no acute distress. Patient with no significant changing her symptoms of low back pain. She denies any right leg symptoms for back pain. No fever, chills, weight loss, lower extremity weakness or numbness, urinary or fecal incontinence, urinary retention or perineal numbness.        Angus Seller, [place] 03/01/12 [phone]

## 2012-03-02 ENCOUNTER — Encounter (HOSPITAL_COMMUNITY): Payer: Self-pay | Admitting: *Deleted

## 2012-03-02 ENCOUNTER — Emergency Department (HOSPITAL_COMMUNITY)
Admission: EM | Admit: 2012-03-02 | Discharge: 2012-03-02 | Disposition: A | Discharge status: Home / Self Care | Payer: Medicare Other | Attending: Emergency Medicine | Admitting: Emergency Medicine

## 2012-03-02 DIAGNOSIS — R21 Rash and other nonspecific skin eruption: Secondary | ICD-10-CM | POA: Insufficient documentation

## 2012-03-02 DIAGNOSIS — K219 Gastro-esophageal reflux disease without esophagitis: Secondary | ICD-10-CM | POA: Insufficient documentation

## 2012-03-02 DIAGNOSIS — Z86718 Personal history of other venous thrombosis and embolism: Secondary | ICD-10-CM | POA: Insufficient documentation

## 2012-03-02 DIAGNOSIS — G8929 Other chronic pain: Secondary | ICD-10-CM | POA: Insufficient documentation

## 2012-03-02 DIAGNOSIS — M129 Arthropathy, unspecified: Secondary | ICD-10-CM | POA: Insufficient documentation

## 2012-03-02 DIAGNOSIS — J45909 Unspecified asthma, uncomplicated: Secondary | ICD-10-CM | POA: Insufficient documentation

## 2012-03-02 DIAGNOSIS — F209 Schizophrenia, unspecified: Secondary | ICD-10-CM | POA: Insufficient documentation

## 2012-03-02 DIAGNOSIS — Z981 Arthrodesis status: Secondary | ICD-10-CM | POA: Insufficient documentation

## 2012-03-02 DIAGNOSIS — G4730 Sleep apnea, unspecified: Secondary | ICD-10-CM | POA: Insufficient documentation

## 2012-03-02 DIAGNOSIS — D509 Iron deficiency anemia, unspecified: Secondary | ICD-10-CM | POA: Insufficient documentation

## 2012-03-02 DIAGNOSIS — E785 Hyperlipidemia, unspecified: Secondary | ICD-10-CM | POA: Insufficient documentation

## 2012-03-02 DIAGNOSIS — Z86711 Personal history of pulmonary embolism: Secondary | ICD-10-CM | POA: Insufficient documentation

## 2012-03-02 DIAGNOSIS — I10 Essential (primary) hypertension: Secondary | ICD-10-CM | POA: Insufficient documentation

## 2012-03-02 MED ORDER — CYPROHEPTADINE HCL 4 MG PO TABS: 4.0000 mg | ORAL_TABLET | Freq: Three times a day (TID) | ORAL | Status: DC | PRN

## 2012-03-02 NOTE — Discharge Instructions (Signed)
See the dermatologist as scheduled on June 28 at [facility] in [place], [place]. See your primary care Dr. if needed for problems. Take Tylenol if needed for pain.   Rash A rash is a change in the color or feel of your skin. There are many different types of rashes. You may have other problems along with your rash. HOME CARE  Avoid the thing that caused your rash.   Do not scratch your rash.   You may take cools baths to help stop itching.   Only take medicines as told by your doctor.   Keep all doctor visits as told.  GET HELP RIGHT AWAY IF:   Your pain, puffiness (swelling), or redness gets worse.   You have a fever.   You have new or severe problems.   You have body aches, watery poop (diarrhea), or you throw up (vomit).   Your rash is not better after 3 days.  MAKE SURE YOU:   Understand these instructions.   Will watch your condition.   Will get help right away if you are not doing well or get worse.  Document Released: 02/27/2008 Document Revised: 08/30/2011 Document Reviewed: 06/25/2011 [facility] Patient Information 2012 [place], [place].

## 2012-03-02 NOTE — ED Notes (Signed)
Pt reports itching rash to entire body, has been seen here and at pcp for it, no relief with creams.

## 2012-03-02 NOTE — ED Provider Notes (Addendum)
History   This chart was scribed for Flint Melter, MD scribed by Magnus Sinning. The patient was seen in room STRE5/STRE5 seen at 14:14.    CSN: [id number]  Arrival date & time 03/02/12  1332   None     Chief Complaint  Patient presents with  . Rash    (Consider location/radiation/quality/duration/timing/severity/associated sxs/prior treatment) HPI [name] [name] is a 71 y.o. female who presents to the Emergency Department complaining of a moderately itchy and painful rash to entire body, onset several weeks. Says she has had rash after she was placed on Coumadin, which she has been taking for the past 3 years, but states it has worsened over the past few weeks. Says she is unsure what causes rash. She explains that she was given hydroxyzine to treat the itchiness with no relief. Explains she has an appt at [facility] on June 28th. There are no other associated sxs, modifying or aggravating factors. She was evaluated in the emergency department 02/15/12. She was given prescriptions and referred to her PCP and recommended to see a dermatologist. She reports trouble getting in to see a dermatologist immediately. The rash causes pruritus. The rash is not painful. She has no weakness, dizziness, nausea, or vomiting.  The patient has frequent visits to the emergency room for a multitude of complaints. She frequently seeks pain medication. She has a primary care doctor, but does not seem to trust him.   Past Medical History  Diagnosis Date  . Depression   . Schizophrenia   . Arthritis   . Hyperlipidemia   . Hypertension   . Chronic pain   . 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 DVT (deep venous thrombosis)     per 01/17/10 d/c summary- "remote hx of dvt"?  . Iron deficiency anemia   . Chronic headaches   . Sleep apnea   . Glaucoma   . Hemorrhoids   . Asthma   . Anxiety   . GERD (gastroesophageal reflux disease)     Past Surgical History  Procedure Date  .  Spinal fusion     2004  . Joint replacement right knee 11/2008  . Tubal ligation year 45  . Carpal tunnel release     bilateral     Family History  Problem Relation Age of Onset  . Diabetes Sister   . Colon cancer Brother 40    History  Substance Use Topics  . Smoking status: Never Smoker   . Smokeless tobacco: Never Used  . Alcohol Use: No   Review of Systems 10 Systems reviewed and are negative for acute change except as noted in the HPI. Allergies  Adhesive  Home Medications   Current Outpatient Rx  Name Route Sig Dispense Refill  . ALBUTEROL SULFATE HFA 108 (90 BASE) MCG/ACT IN AERS Inhalation Inhale 2 puffs into the lungs 2 (two) times daily as needed. For shortness of breath and wheezing    . ATENOLOL 100 MG PO TABS Oral Take 100 mg by mouth daily.    Marland Kitchen CLONAZEPAM 0.5 MG PO TABS Oral Take 0.5 mg by mouth 3 (three) times daily as needed. For anxiety    . DICLOFENAC SODIUM 1 % TD GEL Topical Apply 1 application topically 4 (four) times daily as needed. For pain    . ESOMEPRAZOLE MAGNESIUM 40 MG PO CPDR Oral Take 40 mg by mouth daily.    Marland Kitchen FERROUS SULFATE 325 (65 FE) MG PO TABS Oral Take  325 mg by mouth 2 (two) times daily with a meal.    . FLUOXETINE HCL 20 MG PO CAPS Oral Take 60 mg by mouth daily.     Marland Kitchen FLUTICASONE PROPIONATE  HFA 220 MCG/ACT IN AERO Inhalation Inhale 1 puff into the lungs 2 (two) times daily.    . FUROSEMIDE 40 MG PO TABS Oral Take 40 mg by mouth daily as needed. For increased fluid in legs    . GABAPENTIN 100 MG PO CAPS Oral Take 200 mg by mouth at bedtime as needed. For pain    . HYDROCHLOROTHIAZIDE 25 MG PO TABS Oral Take 25 mg by mouth daily. For high blood pressure     . HYDROXYZINE HCL 25 MG PO TABS Oral Take 25 mg by mouth every 6 (six) hours. For itching    . MECLIZINE HCL 25 MG PO TABS Oral Take 25 mg by mouth every 8 (eight) hours as needed. For vertigo    . POTASSIUM CHLORIDE CRYS ER 20 MEQ PO TBCR Oral Take 20 mEq by mouth daily.      .  TRAMADOL HCL 50 MG PO TABS Oral Take 50 mg by mouth 3 (three) times daily as needed. For pain    . VITAMIN D (ERGOCALCIFEROL) 50000 UNITS PO CAPS Oral Take 50,000 Units by mouth every 7 (seven) days. Takes on Sunday    . WARFARIN SODIUM 5 MG PO TABS Oral Take 5 mg by mouth daily.    Marland Kitchen CYPROHEPTADINE HCL 4 MG PO TABS Oral Take 1 tablet (4 mg total) by mouth 3 (three) times daily as needed. 30 tablet 0    For itching    BP 118/75  Pulse 88  Temp(Src) 98 F (36.7 C) (Oral)  Resp 18  SpO2 100%  Physical Exam  Nursing note and vitals reviewed. Constitutional: She is oriented to person, place, and time. She appears well-developed and well-nourished. No distress.  HENT:  Head: Normocephalic and atraumatic.  Eyes: Conjunctivae and EOM are normal.  Neck: Neck supple. No tracheal deviation present.  Cardiovascular: Normal rate.   Pulmonary/Chest: Effort normal. No respiratory distress.  Musculoskeletal: Normal range of motion.  Neurological: She is alert and oriented to person, place, and time.  Skin: Skin is warm and dry.       Indistinct diffuse, red, papular rash. Areas of involvement are both isolated and coalesced. The rash varies from 2 mm to 10 cm. Area involved is primarily the anterior legs bilaterally, most prominent on the shins. There are scattered areas of involvement on the upper torso and posterior neck. There are no areas on the back. There are no vesicles, petechiae, skin sloughing or draining areas. There is no oral involvement.  Psychiatric:       Patient is agitated. She does not understand why she cannot have blood drawn for testing or receive other treatment. She requested to see the chare nurse. This is typical behavior for this patient that she has exhibited multiple times when I have seen her in the past.    ED Course  Procedures (including critical care time) DIAGNOSTIC STUDIES: Oxygen Saturation is 100% on room air, normal by my interpretation.    COORDINATION OF  CARE:  Labs Reviewed - No data to display No results found.   1. Rash       MDM  Nonspecific subacute  Rash. No worrisome findings to suggest a systemic infection, metabolic instability, or rapidly progressive, rash. No evidence for [employer] syndrome. Doubt metabolic instability,  serious bacterial infection or impending vascular collapse; the patient is stable for discharge.    I personally performed the services described in this documentation, which was scribed in my presence. The recorded information has been reviewed and considered.   Plan: Home Medications- Usual plus Periactin for itching; Home Treatments- rest; Recommended follow up- Dermatology as scheduled, PCP prn          Flint Melter, MD 03/02/12 1440   Prior to leaving, the patient approached my desk, and asked me questions. They surrounded an issue of pain. She then accused me of ignoring her pain. She then accused me of being a "racist, mistreating blacks, and a  Redneck."  She also threatened me with physical harm, and said that she would get her brother to come after me. She was escorted out of the department by [employer].    Flint Melter, MD 03/02/12 [phone]

## 2012-03-04 ENCOUNTER — Ambulatory Visit (HOSPITAL_BASED_OUTPATIENT_CLINIC_OR_DEPARTMENT_OTHER): Payer: Medicare Other | Admitting: Pharmacist

## 2012-03-04 ENCOUNTER — Other Ambulatory Visit (HOSPITAL_BASED_OUTPATIENT_CLINIC_OR_DEPARTMENT_OTHER): Payer: Medicare Other | Admitting: Lab

## 2012-03-04 DIAGNOSIS — I2699 Other pulmonary embolism without acute cor pulmonale: Secondary | ICD-10-CM

## 2012-03-04 DIAGNOSIS — D6851 Activated protein C resistance: Secondary | ICD-10-CM

## 2012-03-04 DIAGNOSIS — Z7901 Long term (current) use of anticoagulants: Secondary | ICD-10-CM

## 2012-03-04 DIAGNOSIS — D6859 Other primary thrombophilia: Secondary | ICD-10-CM

## 2012-03-04 LAB — PROTIME-INR: Protime: 19.2 Seconds — ABNORMAL HIGH (ref 10.6–13.4)

## 2012-03-04 NOTE — Progress Notes (Signed)
Pt presents subtherapeutic today at 1.6.  She has what apprears to be scabies.  She has visited 3 ERS in last month and her PCP with the constant itching.  It is very red and raw on her LE.  She claims it has moved up her body.  She was itching the whole appmt.  She states she is new to this house having been there 2 months.  Yesterday at [facility] she was prescibed Augmentin 500 TID for 10 days and Hydroxyzine 25mg QID prn.  She also has an empty tube of Permethrin.  I encouraged her to get that refilled and instructed her how to use it and how to wash all clothes, bedding and rugs with hot water.  She states they were told the house did have bugs prior to them moving in.  No one else in  the home is having these symptoms.  We will need to recheck her INR next Monday.  She states that she got a lovenox shot on Sunday night at an ER because her INR was low.  She could not recall how low.  She states she is taking 5 mg daily.  I will have her take 7.5 mg today and then resume 5 mg daily.  We will recheck her INR on Monday.  She is going to call her PCP about a refill on the permathrin.  

## 2012-03-04 NOTE — Patient Instructions (Signed)
Pt presents subtherapeutic today at 1.6.  She has what apprears to be scabies.  She has visited 3 ERS in last month and her PCP with the constant itching.  It is very red and raw on her LE.  She claims it has moved up her body.  She was itching the whole appmt.  She states she is new to this house having been there 2 months.  Yesterday at [facility] ER she was prescibed Augmentin 500 TID for 10 days and Hydroxyzine 25mg  QID prn.  She also has an empty tube of Permethrin.  I encouraged her to get that refilled and instructed her how to use it and how to wash all clothes, bedding and rugs with hot water.  She states they were told the house did have bugs prior to them moving in.  No one else in  the home is having these symptoms.  We will need to recheck her INR next Monday.  She states that she got a lovenox shot on Sunday night at an ER because her INR was low.  She could not recall how low.  She states she is taking 5 mg daily.  I will have her take 7.5 mg today and then resume 5 mg daily.  We will recheck her INR on Monday.  She is going to call her PCP about a refill on the permathrin.

## 2012-03-10 ENCOUNTER — Other Ambulatory Visit (HOSPITAL_BASED_OUTPATIENT_CLINIC_OR_DEPARTMENT_OTHER): Payer: Medicare Other | Admitting: Lab

## 2012-03-10 ENCOUNTER — Ambulatory Visit (HOSPITAL_BASED_OUTPATIENT_CLINIC_OR_DEPARTMENT_OTHER): Payer: Medicare Other | Admitting: Pharmacist

## 2012-03-10 DIAGNOSIS — D6859 Other primary thrombophilia: Secondary | ICD-10-CM

## 2012-03-10 DIAGNOSIS — Z7901 Long term (current) use of anticoagulants: Secondary | ICD-10-CM

## 2012-03-10 DIAGNOSIS — I2699 Other pulmonary embolism without acute cor pulmonale: Secondary | ICD-10-CM

## 2012-03-10 DIAGNOSIS — D6851 Activated protein C resistance: Secondary | ICD-10-CM

## 2012-03-10 LAB — POCT INR: INR: 3.4

## 2012-03-10 LAB — PROTIME-INR

## 2012-03-10 NOTE — Progress Notes (Signed)
INR = 3.4 today Pt took 7.5 mg x 1 dose, then 5 mg/day as instructed last week. Pt is not on Augmentin.  Pt stated [facility] told her to stop taking it. She has some new topical steroid creams that [facility] prescribed.  She goes there for f/u next Wed 6/26. No other family members have contracted scabies. Pts husband, [name] present during visit today.  He will set up her pill box for her for the next several weeks.  Pt did not bring one w/ her today or I would have filled it for her. INR a little high today.  Will resume previous maintenance dose of 5 mg/day of Coumadin. Recheck INR in 2 weeks. Marily Lente, Pharm.D.

## 2012-03-24 ENCOUNTER — Other Ambulatory Visit: Payer: Medicare Other | Admitting: Lab

## 2012-03-24 ENCOUNTER — Ambulatory Visit: Payer: Medicare Other

## 2012-03-25 ENCOUNTER — Ambulatory Visit (HOSPITAL_BASED_OUTPATIENT_CLINIC_OR_DEPARTMENT_OTHER): Payer: Medicare Other | Admitting: Pharmacist

## 2012-03-25 ENCOUNTER — Other Ambulatory Visit (HOSPITAL_BASED_OUTPATIENT_CLINIC_OR_DEPARTMENT_OTHER): Payer: Medicare Other | Admitting: Lab

## 2012-03-25 ENCOUNTER — Emergency Department (HOSPITAL_COMMUNITY)
Admission: EM | Admit: 2012-03-25 | Discharge: 2012-03-25 | Disposition: A | Discharge status: Home / Self Care | Payer: Medicare Other | Attending: Emergency Medicine | Admitting: Emergency Medicine

## 2012-03-25 ENCOUNTER — Encounter (HOSPITAL_COMMUNITY): Payer: Self-pay | Admitting: Emergency Medicine

## 2012-03-25 DIAGNOSIS — D6859 Other primary thrombophilia: Secondary | ICD-10-CM

## 2012-03-25 DIAGNOSIS — G8929 Other chronic pain: Secondary | ICD-10-CM | POA: Insufficient documentation

## 2012-03-25 DIAGNOSIS — L4050 Arthropathic psoriasis, unspecified: Secondary | ICD-10-CM | POA: Insufficient documentation

## 2012-03-25 DIAGNOSIS — M129 Arthropathy, unspecified: Secondary | ICD-10-CM | POA: Insufficient documentation

## 2012-03-25 DIAGNOSIS — Z86718 Personal history of other venous thrombosis and embolism: Secondary | ICD-10-CM | POA: Insufficient documentation

## 2012-03-25 DIAGNOSIS — Z86711 Personal history of pulmonary embolism: Secondary | ICD-10-CM | POA: Insufficient documentation

## 2012-03-25 DIAGNOSIS — G4730 Sleep apnea, unspecified: Secondary | ICD-10-CM | POA: Insufficient documentation

## 2012-03-25 DIAGNOSIS — F209 Schizophrenia, unspecified: Secondary | ICD-10-CM | POA: Insufficient documentation

## 2012-03-25 DIAGNOSIS — D509 Iron deficiency anemia, unspecified: Secondary | ICD-10-CM | POA: Insufficient documentation

## 2012-03-25 DIAGNOSIS — K219 Gastro-esophageal reflux disease without esophagitis: Secondary | ICD-10-CM | POA: Insufficient documentation

## 2012-03-25 DIAGNOSIS — I2699 Other pulmonary embolism without acute cor pulmonale: Secondary | ICD-10-CM

## 2012-03-25 DIAGNOSIS — D6851 Activated protein C resistance: Secondary | ICD-10-CM

## 2012-03-25 DIAGNOSIS — Z981 Arthrodesis status: Secondary | ICD-10-CM | POA: Insufficient documentation

## 2012-03-25 DIAGNOSIS — Z7901 Long term (current) use of anticoagulants: Secondary | ICD-10-CM

## 2012-03-25 LAB — PROTIME-INR: INR: 1.8 — ABNORMAL LOW (ref 2.00–3.50)

## 2012-03-25 LAB — POCT INR: INR: 1.8

## 2012-03-25 MED ORDER — HYDROMORPHONE HCL PF 1 MG/ML IJ SOLN
1.0000 mg | Freq: Once | INTRAMUSCULAR | Status: AC
  Administered 2012-03-25: 1 mg via INTRAMUSCULAR
  Filled 2012-03-25: qty 1

## 2012-03-25 NOTE — ED Notes (Signed)
PT. REPORTS BILATERAL FEET PAIN AND LOW BACK PAIN FOR SEVERAL DAYS , STANDING ALL NIGHT TAKING CARE OF HER SON , AMBULATORY.

## 2012-03-25 NOTE — Discharge Instructions (Signed)
It is VERY important to follow up closely with Dr. Concepcion Elk and your specialists at [facility] to further discuss your future pain management. Return to ER at any time for emergent changing or worsening of symptoms but having a chronic pain management plan made by your regular medical doctors is important.   Chronic Pain Chronic pain can be defined as pain that is lasting, off and on, and lasts for 3 to 6 months or longer. Many things cause chronic pain, which can make it difficult to make a discrete diagnosis. There are many treatment options available for chronic pain. However, finding a treatment that works well for you may require trying various approaches until a suitable one is found. CAUSES  In some types of chronic medical conditions, the pain is caused by a normal pain response within the body. A normal pain response helps the body identify illness or injury and prevent further damage from being done. In these cases, the cause of the pain may be identified and treated, even if it may not be cured completely. Examples of chronic conditions which can cause chronic pain include:  Inflammation of the joints (arthritis).   Back pain or neck pain (including bulging or herniated disks).   Migraine headaches.   Cancer.  In some other types of chronic pain syndromes, the pain is caused by an abnormal pain response within the body. An abnormal pain response is present when there is no ongoing cause (or stimulus) for the pain, or when the cause of the pain is arising from the nerves or nervous system itself. Examples of conditions which can cause chronic pain due to an abnormal pain response include:  Fibromyalgia.   Reflex sympathetic dystrophy (RSD).   Neuropathy (when the nerves themselves are damaged, and may cause pain).  DIAGNOSIS  Your caregiver will help diagnose your condition over time. In many cases, the initial focus will be on excluding conditions that could be causing the pain.  Depending on your symptoms, your caregiver may order some tests to diagnose your condition. Some of these tests include:  Blood tests.   Computerized X-ray scans (CT scan).   Computerized magnetic scans (MRI).   X-rays.   Ultrasounds.   Nerve conduction studies.   Consultation with other physicians or specialists.  TREATMENT  There are many treatment options for people suffering from chronic pain. Finding a treatment that works well may take time.   You may be referred to a pain management specialist.   You may be put on medication to help with the pain. Unfortunately, some medications (such as opiate medications) may not be very effective in cases where chronic pain is due to abnormal pain responses. Finding the right medications can take some time.   Adjunctive therapies may be used to provide additional relief and improve a patient's quality of life. These therapies include:   Mindfulness meditation.   Acupuncture.   Biofeedback.   Cognitive-behavioral therapy.   In certain cases, surgical interventions may be attempted.  HOME CARE INSTRUCTIONS   Make sure you understand these instructions prior to discharge.   Ask any questions and share any further concerns you have with your caregiver prior to discharge.   Take all medications as directed by your caregiver.   Keep all follow-up appointments.  SEEK MEDICAL CARE IF:   Your pain gets worse.   You develop a new pain that was not present before.   You cannot tolerate any medications prescribed by your caregiver.   You  develop new symptoms since your last visit with your caregiver.  SEEK IMMEDIATE MEDICAL CARE IF:   You develop muscular weakness.   You have decreased sensation or numbness.   You lose control of bowel or bladder function.   Your pain suddenly gets much worse.   You have an oral temperature above 102 F (38.9 C), not controlled by medication.   You develop shaking chills, confusion, chest  pain, or shortness of breath.  Document Released: 06/02/2002 Document Revised: 08/30/2011 Document Reviewed: 09/08/2008 [facility] Patient Information 2012 [place], [place].

## 2012-03-25 NOTE — Progress Notes (Signed)
INR = 1.8 on 5 mg/day Pt has no complaints re: anticoagulation.   No missed doses of Coumadin. She has been dx w/ psoriasis (seeing [facility] dermatologist).  She has been placed on tacrolimus ointment BID. Pt expressed that she is seeking a new PCP at [facility] & wants to leave the [facility]. INR is slightly low but she has been stable on 5 mg/day prior to today so I will leave her dose the same. Compliance is a concern.  Her granddaughter checks on her every evening.  She has been helping Dlynn set up her medicine box. Return in 2 weeks to recheck INR to make sure she is back within range. Marily Lente, Pharm.D.

## 2012-03-25 NOTE — ED Provider Notes (Signed)
History     CSN: [id number]  Arrival date & time 03/25/12  1478   First MD Initiated Contact with Patient 03/25/12 [phone]      Chief Complaint  Patient presents with  . Foot Pain    (Consider location/radiation/quality/duration/timing/severity/associated sxs/prior treatment) The history is provided by the patient and medical records.    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 Patient has history of chronic back pain associated with psoriatic arthritis. Patient is being followed by her primary care provider as well specialist at [facility] for this diagnosis that has been made within the last 1-2 months. Patient states she's in the middle of having her medications tailored to this new diagnosis and is currently on a chronic pain medicine. Patient states that she ran out of the pain medication over the last few days and is scheduled to have pain medication refilled 2 days. Patient states she's been unable to get to her primary care provider due to taking care of her son more so over the last 2-3 days. Patient was in the emergency department with her son who is checked in as a patient for evaluation of seizures. Patient states that she had been standing on her feet all night taking care of her son which exacerbated her chronic back pain and chronic bilateral foot pain and therefore decided to check into the the ER "so I could get a pain shot until my medicine comes in". Patient has no other complaints. She denies fevers, chills, chest pain, shortness of breath, abdominal pain, nausea, vomiting, diarrhea, dysuria, hematuria, blood in her stool, lower extremity numbness/tingling/weakness, loss of bowel or bladder function, saddle seat paresthesias, or any new changes in her pain.  Past Medical History  Diagnosis Date  . Depression   . Schizophrenia   . Arthritis    . Hyperlipidemia   . Hypertension   . Chronic pain   . 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 DVT (deep venous thrombosis)     per 01/17/10 d/c summary- "remote hx of dvt"?  . Iron deficiency anemia   . Chronic headaches   . Sleep apnea   . Glaucoma   . Hemorrhoids   . Asthma   . Anxiety   . GERD (gastroesophageal reflux disease)     Past Surgical History  Procedure Date  . Spinal fusion     2004  . Joint replacement right knee 11/2008  . Tubal ligation year 80  . Carpal tunnel release     bilateral     Family History  Problem Relation Age of Onset  . Diabetes Sister   . Colon cancer Brother 40    History  Substance Use Topics  . Smoking status: Never Smoker   . Smokeless tobacco: Never Used  . Alcohol Use: No    OB History    Grav Para Term Preterm Abortions TAB SAB Ect Mult Living                  Review of Systems  All other systems reviewed and are negative.    Allergies  Adhesive  Home Medications   Current Outpatient Rx  Name Route Sig Dispense Refill  . ALBUTEROL SULFATE HFA 108 (90 BASE) MCG/ACT IN AERS Inhalation Inhale 2 puffs into the lungs 2 (two) times daily as needed. For shortness of  breath and wheezing    . ATENOLOL 100 MG PO TABS Oral Take 100 mg by mouth daily.    Marland Kitchen CLONAZEPAM 0.5 MG PO TABS Oral Take 0.5 mg by mouth 3 (three) times daily as needed. For anxiety    . DICLOFENAC SODIUM 1 % TD GEL Topical Apply 1 application topically 4 (four) times daily as needed. For pain    . ESOMEPRAZOLE MAGNESIUM 40 MG PO CPDR Oral Take 40 mg by mouth daily.    Marland Kitchen FERROUS SULFATE 325 (65 FE) MG PO TABS Oral Take 325 mg by mouth 2 (two) times daily with a meal.    . FLUOXETINE HCL 20 MG PO CAPS Oral Take 60 mg by mouth daily.     Marland Kitchen FLUTICASONE PROPIONATE  HFA 220 MCG/ACT IN AERO Inhalation Inhale 1 puff into the lungs 2 (two) times daily.    . FUROSEMIDE 40 MG PO TABS Oral Take 40 mg by mouth daily as needed. For increased  fluid in legs    . GABAPENTIN 100 MG PO CAPS Oral Take 200 mg by mouth at bedtime as needed. For pain    . HYDROCHLOROTHIAZIDE 25 MG PO TABS Oral Take 25 mg by mouth daily. For high blood pressure     . HYDROXYZINE HCL 25 MG PO TABS Oral Take 25 mg by mouth every 6 (six) hours. For itching    . MECLIZINE HCL 25 MG PO TABS Oral Take 25 mg by mouth every 8 (eight) hours as needed. For vertigo    . POTASSIUM CHLORIDE CRYS ER 20 MEQ PO TBCR Oral Take 20 mEq by mouth daily.      . TRAMADOL HCL 50 MG PO TABS Oral Take 50 mg by mouth 3 (three) times daily as needed. For pain    . VITAMIN D (ERGOCALCIFEROL) 50000 UNITS PO CAPS Oral Take 50,000 Units by mouth every 7 (seven) days. Takes on Sunday    . WARFARIN SODIUM 5 MG PO TABS Oral Take 5 mg by mouth daily.      BP 151/105  Pulse 94  Temp 97.6 F (36.4 C) (Oral)  Resp 20  SpO2 98%  Physical Exam  Nursing note and vitals reviewed. Constitutional: She is oriented to person, place, and time. She appears well-developed and well-nourished. No distress.  HENT:  Head: Normocephalic and atraumatic.  Eyes: Conjunctivae are normal.  Neck: Normal range of motion. Neck supple.  Cardiovascular: Normal rate, regular rhythm, normal heart sounds and intact distal pulses.  Exam reveals no gallop and no friction rub.   No murmur heard. Pulmonary/Chest: Effort normal and breath sounds normal. No respiratory distress. She has no wheezes. She has no rales. She exhibits no tenderness.  Abdominal: Soft. Bowel sounds are normal. She exhibits no distension and no mass. There is no tenderness. There is no rebound and no guarding.  Musculoskeletal: Normal range of motion. She exhibits tenderness. She exhibits no edema.       Mild tenderness to palpation of soft tissue of lower lumbar back without skin changes or crepitus. Tenderness to palpation of bilateral feet without swelling, erythema, heat, or skin changes. Bilateral lower extremities are neurovascularly intact  with normal sensation, pulses, and cap refill.  Neurological: She is alert and oriented to person, place, and time.  Skin: Skin is warm and dry. No rash noted. She is not diaphoretic. No erythema.  Psychiatric: She has a normal mood and affect.    ED Course  Procedures (including critical care time)  IM  dialudid  Labs Reviewed - No data to display No results found.   1. Psoriatic arthritis   2. Chronic pain       MDM  Patient is in the emergency department because she was here with her son who is a patient being evaluated for seizures and has run out of her chronic pain medication. Patient is here requesting a shot of Dilaudid for chronic pain management stating that her regular pain medicines are scheduled to arrive at her house in 2 days. She has no new complaints but ongoing complaints of chronic pain have been evaluated by her primary care provider as well as her specialist [facility]. She's afebrile, nontoxic-appearing, and in no acute distress. I spoke at length with patient about the inappropriate use of the emergency department for chronic pain management however we will accommodate her today seeing that she has been in the emergency department all night with her son. Patient is scheduled to see her primary care provider her specialist in the near future for further tailoring of her chronic pain management plan and her psoriatic arthritis.        [place], [place] 03/25/12 0646  Drucie Opitz, PA 03/25/12 [phone]

## 2012-03-26 NOTE — ED Provider Notes (Signed)
Medical screening examination/treatment/procedure(s) were conducted as a shared visit with non-physician practitioner(s) and myself.  I personally evaluated the patient during the encounter  Pt in no distress.  Happened to be here due to her son having seizures.  PT had been up and on feet a sig portion of day due to her son's issues.  She had exacerbation of chronic pain and requested IM analgesic.  No distress.  Non emergent.    Gavin Pound. Oletta Lamas, MD 03/26/12 1610

## 2012-04-07 ENCOUNTER — Emergency Department (INDEPENDENT_AMBULATORY_CARE_PROVIDER_SITE_OTHER)
Admission: EM | Admit: 2012-04-07 | Discharge: 2012-04-07 | Disposition: A | Discharge status: Home / Self Care | Payer: Medicare Other | Source: Home / Self Care | Attending: Family Medicine | Admitting: Family Medicine

## 2012-04-07 ENCOUNTER — Encounter (HOSPITAL_COMMUNITY): Payer: Self-pay | Admitting: Emergency Medicine

## 2012-04-07 DIAGNOSIS — K219 Gastro-esophageal reflux disease without esophagitis: Secondary | ICD-10-CM

## 2012-04-07 MED ORDER — ESOMEPRAZOLE MAGNESIUM 40 MG PO CPDR: 40.0000 mg | DELAYED_RELEASE_CAPSULE | Freq: Every day | ORAL | Status: DC

## 2012-04-07 NOTE — ED Provider Notes (Signed)
History     CSN: [id number]  Arrival date & time 04/07/12  1409   First MD Initiated Contact with Patient 04/07/12 1415      Chief Complaint  Patient presents with  . Gastrophageal Reflux    (Consider location/radiation/quality/duration/timing/severity/associated sxs/prior treatment) HPI Comments: 71 year old obese female, with complex medical history and multiple comorbidities including GI reflux, chronic anticoagulation (warfarin) for history of PE and DVT. And recent diagnosis of psoriasis. Here requesting Nexium refills. States she has acid reflux daily described as acid taste in her mouth. Worse with some types of food. Has taken Nexium in the past with relief of her symptoms but ran out 3 days ago. Denies any new symptoms like chest pain, shortest of breath, dizziness or new onset of leg swelling. Also denies melena, fever or chills. Last bowel movement was in am today (soft brown). Denies abdominal pain, nausea vomiting or diarrhea. Has recent diagnosis of psoriasis at [facility] dermatology. Has a comming appointment at [facility] for primary care to establish as a new patient. She used to be a patient at [facility] but reports she was dismissed from the clinic.    Past Medical History  Diagnosis Date  . Depression   . Schizophrenia   . Arthritis   . Hyperlipidemia   . Hypertension   . Chronic pain   . 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 DVT (deep venous thrombosis)     per 01/17/10 d/c summary- "remote hx of dvt"?  . Iron deficiency anemia   . Chronic headaches   . Sleep apnea   . Glaucoma   . Hemorrhoids   . Asthma   . Anxiety   . GERD (gastroesophageal reflux disease)     Past Surgical History  Procedure Date  . Spinal fusion     2004  . Joint replacement right knee 11/2008  . Tubal ligation year 17  . Carpal tunnel release     bilateral     Family History  Problem Relation Age of Onset  . Diabetes Sister   . Colon  cancer Brother 40    History  Substance Use Topics  . Smoking status: Never Smoker   . Smokeless tobacco: Never Used  . Alcohol Use: No    OB History    Grav Para Term Preterm Abortions TAB SAB Ect Mult Living                  Review of Systems  Constitutional: Negative for fever, chills, diaphoresis, activity change, appetite change and fatigue.  HENT: Negative for sore throat and mouth sores.   Respiratory: Negative for cough, chest tightness and shortness of breath.   Cardiovascular: Negative for chest pain, palpitations and leg swelling.  Gastrointestinal: Negative for abdominal pain.       Acid taste in her mouth.  Genitourinary: Negative for dysuria.  Neurological: Negative for dizziness and headaches.    Allergies  Adhesive  Home Medications   Current Outpatient Rx  Name Route Sig Dispense Refill  . FERROUS GLUCONATE 325 MG PO TABS Oral Take 325 mg by mouth daily with breakfast.    . ALBUTEROL SULFATE HFA 108 (90 BASE) MCG/ACT IN AERS Inhalation Inhale 2 puffs into the lungs 2 (two) times daily as needed. For shortness of breath and wheezing    . ATENOLOL 100 MG PO TABS Oral Take 100 mg by mouth daily.    Marland Kitchen VITAMIN D 1000 UNITS PO TABS Oral  Take 1,000 Units by mouth daily.    Marland Kitchen CLONAZEPAM 0.5 MG PO TABS Oral Take 0.5 mg by mouth 3 (three) times daily as needed. For anxiety    . DICLOFENAC SODIUM 1 % TD GEL Topical Apply 1 application topically 4 (four) times daily as needed. For pain    . ESOMEPRAZOLE MAGNESIUM 40 MG PO CPDR Oral Take 1 capsule (40 mg total) by mouth daily. 30 capsule 0  . FERROUS SULFATE 325 (65 FE) MG PO TABS Oral Take 325 mg by mouth 2 (two) times daily with a meal.    . FLUOXETINE HCL 20 MG PO CAPS Oral Take 60 mg by mouth daily.     Marland Kitchen FLUTICASONE PROPIONATE  HFA 220 MCG/ACT IN AERO Inhalation Inhale 1 puff into the lungs 2 (two) times daily.    . FUROSEMIDE 40 MG PO TABS Oral Take 40 mg by mouth daily. For increased fluid in legs    .  GABAPENTIN 100 MG PO CAPS Oral Take 200 mg by mouth at bedtime as needed. For pain    . HYDROCHLOROTHIAZIDE 25 MG PO TABS Oral Take 25 mg by mouth daily. For high blood pressure     . HYDROXYZINE HCL 25 MG PO TABS Oral Take 25 mg by mouth every 6 (six) hours. For itching    . MECLIZINE HCL 25 MG PO TABS Oral Take 25 mg by mouth every 8 (eight) hours as needed. For vertigo    . POTASSIUM CHLORIDE CRYS ER 20 MEQ PO TBCR Oral Take 20 mEq by mouth daily.      Marland Kitchen TACROLIMUS 0.1 % EX OINT Topical Apply 1 application topically 2 (two) times daily.    . TRAMADOL HCL 50 MG PO TABS Oral Take 50 mg by mouth 3 (three) times daily as needed. For pain    . VITAMIN D (ERGOCALCIFEROL) 50000 UNITS PO CAPS Oral Take 50,000 Units by mouth every 7 (seven) days. Takes on Sunday    . WARFARIN SODIUM 5 MG PO TABS Oral Take 5 mg by mouth daily.      BP 175/90  Pulse 74  Temp 98.1 F (36.7 C) (Oral)  Resp 18  SpO2 97%  Physical Exam  Nursing note and vitals reviewed. Constitutional: She is oriented to person, place, and time. She appears well-developed and well-nourished. No distress.       Obese.  HENT:  Head: Normocephalic and atraumatic.  Mouth/Throat: Oropharynx is clear and moist. No oropharyngeal exudate.  Eyes: Pupils are equal, round, and reactive to light. No scleral icterus.  Neck: Neck supple. No JVD present. No thyromegaly present.  Cardiovascular: Normal rate, regular rhythm and normal heart sounds.   Pulmonary/Chest: Effort normal and breath sounds normal. No respiratory distress. She has no wheezes. She has no rales. She exhibits no tenderness.  Abdominal: Soft. She exhibits no distension and no mass. There is no tenderness. There is no rebound and no guarding.  Lymphadenopathy:    She has no cervical adenopathy.  Neurological: She is alert and oriented to person, place, and time. She has normal reflexes.  Skin: No rash noted. She is not diaphoretic.    ED Course  Procedures (including  critical care time)  Labs Reviewed - No data to display No results found.   1. GERD (gastroesophageal reflux disease)       MDM  71 year old female with complex medical history including prior history of GERD. Here complaining of GERD symptoms same as in the past. Denies new symptoms.  Requiring refill on Nexium. Cardiovascularly stable. No chest pain. Refilled Nexium. Patient is in the process of changing her primary care provider to one at [facility]. We discussed her multi-pharmacy list and she agreed to avoid seeking care from different physicians and having just one managing her medications. [facility] is to [facility] Reva Bores, MD 04/10/12 [phone]

## 2012-04-07 NOTE — ED Notes (Signed)
PT HERE WITH C/O ACID REFLUX THAT HAS BEEN ONGOING X 1 MONTH BUT HAS WORSENED WITH UNABLE TO KEEP FOODS DOWN AND PAIN.STATES SHE RAN OUT OF NEXIUM AND NEEDS REFILL.DENIES BLEEDING OR N/V

## 2012-04-08 ENCOUNTER — Ambulatory Visit: Payer: Medicare Other

## 2012-04-08 ENCOUNTER — Other Ambulatory Visit: Payer: Medicare Other | Admitting: Lab

## 2012-04-10 ENCOUNTER — Ambulatory Visit (HOSPITAL_BASED_OUTPATIENT_CLINIC_OR_DEPARTMENT_OTHER): Payer: Medicare Other | Admitting: Pharmacist

## 2012-04-10 ENCOUNTER — Other Ambulatory Visit (HOSPITAL_BASED_OUTPATIENT_CLINIC_OR_DEPARTMENT_OTHER): Payer: Medicare Other | Admitting: Lab

## 2012-04-10 DIAGNOSIS — D6851 Activated protein C resistance: Secondary | ICD-10-CM

## 2012-04-10 DIAGNOSIS — Z7901 Long term (current) use of anticoagulants: Secondary | ICD-10-CM

## 2012-04-10 DIAGNOSIS — I2699 Other pulmonary embolism without acute cor pulmonale: Secondary | ICD-10-CM

## 2012-04-10 DIAGNOSIS — D6859 Other primary thrombophilia: Secondary | ICD-10-CM

## 2012-04-10 NOTE — Progress Notes (Signed)
INR = 2 on 5 mg/day Only c/o knee pain today.  She fell on her "bad knee" & plans to put a "binder" on it. She went to urgent care this week (7/15) for GERD sxs.  She got a refill on Nexium but she states today she is just going to drink buttermilk.  She doesn't need Nexium. This week she has tapered her dose of Prozac from 60 mg/day down to 20 mg/day.  She wants to come off of it. She has also stopped taking her Gabapentin.  She hasn't had any for 1 week. She still has not set up an appt w/ PCP at [facility] yet.  She received a letter this week from Dr. Concepcion Elk stating that he is dismissing her from his practice due to disorderly conduct/rudeness.  INR is fine today.  I will keep her Coumadin dose at 5 mg/day. If the Prozac dose decreases, the INR may decrease as well.  I advised her not to taper off, rather wait to see a PCP.  She insists on decreasing her dose.  I did get her to agree to stay on her current dose (20 mg/day) until she sees Dr. Cyndie Chime on 04/29/12. We will see her again in Coumadin clinic on 04/29/12. Marily Lente, Pharm.D.

## 2012-04-12 ENCOUNTER — Encounter (HOSPITAL_COMMUNITY): Payer: Self-pay

## 2012-04-12 ENCOUNTER — Emergency Department (HOSPITAL_COMMUNITY)
Admission: EM | Admit: 2012-04-12 | Discharge: 2012-04-12 | Disposition: A | Discharge status: Home / Self Care | Payer: Medicare Other | Attending: Emergency Medicine | Admitting: Emergency Medicine

## 2012-04-12 DIAGNOSIS — K219 Gastro-esophageal reflux disease without esophagitis: Secondary | ICD-10-CM | POA: Insufficient documentation

## 2012-04-12 DIAGNOSIS — G4730 Sleep apnea, unspecified: Secondary | ICD-10-CM | POA: Insufficient documentation

## 2012-04-12 DIAGNOSIS — Z86718 Personal history of other venous thrombosis and embolism: Secondary | ICD-10-CM | POA: Insufficient documentation

## 2012-04-12 DIAGNOSIS — I10 Essential (primary) hypertension: Secondary | ICD-10-CM | POA: Insufficient documentation

## 2012-04-12 DIAGNOSIS — Z96659 Presence of unspecified artificial knee joint: Secondary | ICD-10-CM | POA: Insufficient documentation

## 2012-04-12 DIAGNOSIS — Z7901 Long term (current) use of anticoagulants: Secondary | ICD-10-CM | POA: Insufficient documentation

## 2012-04-12 DIAGNOSIS — E785 Hyperlipidemia, unspecified: Secondary | ICD-10-CM | POA: Insufficient documentation

## 2012-04-12 DIAGNOSIS — L409 Psoriasis, unspecified: Secondary | ICD-10-CM

## 2012-04-12 DIAGNOSIS — Z86711 Personal history of pulmonary embolism: Secondary | ICD-10-CM | POA: Insufficient documentation

## 2012-04-12 DIAGNOSIS — Z79899 Other long term (current) drug therapy: Secondary | ICD-10-CM | POA: Insufficient documentation

## 2012-04-12 DIAGNOSIS — L408 Other psoriasis: Secondary | ICD-10-CM | POA: Insufficient documentation

## 2012-04-12 HISTORY — DX: Psoriasis, unspecified: L40.9

## 2012-04-12 MED ORDER — TACROLIMUS 0.1 % EX OINT: TOPICAL_OINTMENT | Freq: Two times a day (BID) | CUTANEOUS | Status: DC

## 2012-04-12 MED ORDER — TRIAMCINOLONE ACETONIDE 0.1 % EX CREA: TOPICAL_CREAM | Freq: Two times a day (BID) | CUTANEOUS | Status: DC

## 2012-04-12 MED ORDER — DEXAMETHASONE SODIUM PHOSPHATE 10 MG/ML IJ SOLN
10.0000 mg | Freq: Once | INTRAMUSCULAR | Status: AC
  Administered 2012-04-12: 10 mg via INTRAVENOUS
  Filled 2012-04-12: qty 1

## 2012-04-12 MED ORDER — DIPHENHYDRAMINE HCL 25 MG PO CAPS
50.0000 mg | ORAL_CAPSULE | Freq: Once | ORAL | Status: AC
  Administered 2012-04-12: 50 mg via ORAL
  Filled 2012-04-12: qty 2

## 2012-04-12 NOTE — ED Notes (Signed)
Pt verbalizes understanding 

## 2012-04-12 NOTE — ED Provider Notes (Signed)
History     CSN: [id number]  Arrival date & time 04/12/12  1830   First MD Initiated Contact with Patient 04/12/12 2234      Chief Complaint  Patient presents with  . Psoriasis    (Consider location/radiation/quality/duration/timing/severity/associated sxs/prior treatment) Patient is a 71 y.o. female presenting with psoriasis. The history is provided by the patient. No language interpreter was used.  Psoriasis This is a recurrent problem. The current episode started in the past 7 days. The problem occurs constantly. The problem has been gradually worsening since onset. The affected locations include the left lower leg and right lower leg. Associated symptoms include itching and plaques. She has been using treatment for 1 to 7 days.  71 yo female here with recurrent rash and itching. Seen at [facility] by dermatologis for the same 1 month ago.  Out of meds now rash to bilateral lower extremities  is recurring in the last few days.  No fever/n/v. Has appointment next week at [facility] dermatology. Recent biopsy of the rash with healing site.   pmh depression, psoriasis, schizophrenia, arthritis, hyperlipidemia, PE chronic pain dvt. On coumadin.     Past Medical History  Diagnosis Date  . Depression   . Schizophrenia   . Arthritis   . Hyperlipidemia   . Hypertension   . Chronic pain   . 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 DVT (deep venous thrombosis)     per 01/17/10 d/c summary- "remote hx of dvt"?  . Iron deficiency anemia   . Chronic headaches   . Sleep apnea   . Glaucoma   . Hemorrhoids   . Asthma   . Anxiety   . GERD (gastroesophageal reflux disease)   . Psoriasis     Past Surgical History  Procedure Date  . Spinal fusion     2004  . Joint replacement right knee 11/2008  . Tubal ligation year 7  . Carpal tunnel release     bilateral     Family History  Problem Relation Age of Onset  . Diabetes Sister   . Colon cancer Brother 40     History  Substance Use Topics  . Smoking status: Never Smoker   . Smokeless tobacco: Never Used  . Alcohol Use: No    OB History    Grav Para Term Preterm Abortions TAB SAB Ect Mult Living                  Review of Systems  Constitutional: Negative.   HENT: Negative.   Eyes: Negative.   Respiratory: Negative.   Cardiovascular: Negative.   Gastrointestinal: Negative.   Skin: Positive for itching and rash.  Neurological: Negative.   Psychiatric/Behavioral: Negative.   All other systems reviewed and are negative.    Allergies  Adhesive  Home Medications   Current Outpatient Rx  Name Route Sig Dispense Refill  . ALBUTEROL SULFATE HFA 108 (90 BASE) MCG/ACT IN AERS Inhalation Inhale 2 puffs into the lungs 2 (two) times daily as needed. For shortness of breath and wheezing    . ATENOLOL 100 MG PO TABS Oral Take 100 mg by mouth daily.    Marland Kitchen CLONAZEPAM 0.5 MG PO TABS Oral Take 0.5 mg by mouth 3 (three) times daily as needed. For anxiety    . DICLOFENAC SODIUM 1 % TD GEL Topical Apply 1 application topically 4 (four) times daily as needed. For pain    . ESOMEPRAZOLE MAGNESIUM 40 MG  PO CPDR Oral Take 1 capsule (40 mg total) by mouth daily. 30 capsule 0  . FERROUS GLUCONATE 325 MG PO TABS Oral Take 325 mg by mouth daily with breakfast.    . FERROUS SULFATE 325 (65 FE) MG PO TABS Oral Take 325 mg by mouth 2 (two) times daily with a meal.    . FLUOXETINE HCL 20 MG PO CAPS Oral Take 20 mg by mouth daily.     Marland Kitchen FLUTICASONE PROPIONATE  HFA 220 MCG/ACT IN AERO Inhalation Inhale 1 puff into the lungs 2 (two) times daily.    . FUROSEMIDE 40 MG PO TABS Oral Take 40 mg by mouth daily. For increased fluid in legs    . GABAPENTIN 100 MG PO CAPS Oral Take 200 mg by mouth at bedtime as needed. For pain    . HYDROCHLOROTHIAZIDE 25 MG PO TABS Oral Take 25 mg by mouth daily. For high blood pressure     . MECLIZINE HCL 25 MG PO TABS Oral Take 25 mg by mouth every 8 (eight) hours as needed. For  vertigo    . POTASSIUM CHLORIDE CRYS ER 20 MEQ PO TBCR Oral Take 20 mEq by mouth daily.      Marland Kitchen TACROLIMUS 0.1 % EX OINT Topical Apply 1 application topically 2 (two) times daily.    . TRAMADOL HCL 50 MG PO TABS Oral Take 50 mg by mouth 3 (three) times daily as needed. For pain    . WARFARIN SODIUM 5 MG PO TABS Oral Take 5 mg by mouth daily.      BP 146/97  Pulse 96  Temp 97.8 F (36.6 C) (Oral)  Resp 14  SpO2 100%  Physical Exam  Nursing note and vitals reviewed. Constitutional: She is oriented to person, place, and time. She appears well-developed and well-nourished.  HENT:  Head: Normocephalic and atraumatic.  Eyes: Conjunctivae and EOM are normal. Pupils are equal, round, and reactive to light.  Neck: Normal range of motion. Neck supple.  Cardiovascular: Normal rate.   Pulmonary/Chest: Effort normal.  Abdominal: Soft.  Musculoskeletal: Normal range of motion. She exhibits no edema and no tenderness.  Neurological: She is alert and oriented to person, place, and time. She has normal reflexes.  Skin: Skin is warm and dry.       Multiple nodular, erythematous rash noted to lower extremities in various sizes Nonpustular, nonvesicular. No drainage.  Psychiatric: She has a normal mood and affect.    ED Course  Procedures (including critical care time)  Labs Reviewed - No data to display No results found.   No diagnosis found.    MDM  71yo female with multiple ER visits and recurrent rash that has been treated with steroids in the past even though she is on coumadin for dvt.  Will keep her appointment in 2 days at the coumadin clinic and follow up next week with dermatology at [facility].  Afebrile involving her lower extremities.  Biopsy has been taken with healing site.  Refill on her meds from [facility] as well.  Protopic 0.1% and kenalog 0.1%.           Remi Haggard, NP 04/14/12 1956

## 2012-04-12 NOTE — ED Notes (Signed)
Pt reports running out of medication for psorasis.  Pt c/o "itching all over"

## 2012-04-16 NOTE — ED Provider Notes (Signed)
History/physical exam/procedure(s) were performed by non-physician practitioner and as supervising physician I was immediately available for consultation/collaboration. I have reviewed all notes and am in agreement with care and plan.   Trevaris Pennella S Lavonda Thal, MD 04/16/12 0753 

## 2012-04-17 ENCOUNTER — Encounter: Payer: Self-pay | Admitting: Pharmacist

## 2012-04-17 NOTE — Progress Notes (Signed)
Pt has found a PCP.  She will go to [facility] on [address]   She has appt there today (7/25).  Their ph # is [phone]. Marily Lente, Pharm.D.

## 2012-04-29 ENCOUNTER — Other Ambulatory Visit (HOSPITAL_BASED_OUTPATIENT_CLINIC_OR_DEPARTMENT_OTHER): Payer: Medicare Other

## 2012-04-29 ENCOUNTER — Encounter: Payer: Self-pay | Admitting: Oncology

## 2012-04-29 ENCOUNTER — Encounter (HOSPITAL_COMMUNITY): Payer: Self-pay | Admitting: *Deleted

## 2012-04-29 ENCOUNTER — Telehealth: Payer: Self-pay | Admitting: Oncology

## 2012-04-29 ENCOUNTER — Emergency Department (HOSPITAL_COMMUNITY)
Admission: EM | Admit: 2012-04-29 | Discharge: 2012-04-30 | Disposition: A | Discharge status: Home / Self Care | Payer: Medicare Other | Attending: Emergency Medicine | Admitting: Emergency Medicine

## 2012-04-29 ENCOUNTER — Ambulatory Visit: Payer: Medicare Other | Admitting: Pharmacist

## 2012-04-29 ENCOUNTER — Ambulatory Visit (HOSPITAL_BASED_OUTPATIENT_CLINIC_OR_DEPARTMENT_OTHER): Payer: Medicare Other | Admitting: Oncology

## 2012-04-29 VITALS — BP 150/96 | HR 87 | Temp 97.8°F | Resp 20 | Ht 66.0 in | Wt 237.9 lb

## 2012-04-29 DIAGNOSIS — F205 Residual schizophrenia: Secondary | ICD-10-CM

## 2012-04-29 DIAGNOSIS — L409 Psoriasis, unspecified: Secondary | ICD-10-CM | POA: Insufficient documentation

## 2012-04-29 DIAGNOSIS — D649 Anemia, unspecified: Secondary | ICD-10-CM

## 2012-04-29 DIAGNOSIS — D509 Iron deficiency anemia, unspecified: Secondary | ICD-10-CM | POA: Insufficient documentation

## 2012-04-29 DIAGNOSIS — D6851 Activated protein C resistance: Secondary | ICD-10-CM

## 2012-04-29 DIAGNOSIS — G4730 Sleep apnea, unspecified: Secondary | ICD-10-CM | POA: Insufficient documentation

## 2012-04-29 DIAGNOSIS — Z86718 Personal history of other venous thrombosis and embolism: Secondary | ICD-10-CM | POA: Insufficient documentation

## 2012-04-29 DIAGNOSIS — M129 Arthropathy, unspecified: Secondary | ICD-10-CM | POA: Insufficient documentation

## 2012-04-29 DIAGNOSIS — I2699 Other pulmonary embolism without acute cor pulmonale: Secondary | ICD-10-CM

## 2012-04-29 DIAGNOSIS — Z86711 Personal history of pulmonary embolism: Secondary | ICD-10-CM | POA: Insufficient documentation

## 2012-04-29 DIAGNOSIS — Z800 Family history of malignant neoplasm of digestive organs: Secondary | ICD-10-CM

## 2012-04-29 DIAGNOSIS — Z7901 Long term (current) use of anticoagulants: Secondary | ICD-10-CM

## 2012-04-29 DIAGNOSIS — I10 Essential (primary) hypertension: Secondary | ICD-10-CM | POA: Insufficient documentation

## 2012-04-29 DIAGNOSIS — J45909 Unspecified asthma, uncomplicated: Secondary | ICD-10-CM | POA: Insufficient documentation

## 2012-04-29 DIAGNOSIS — G8929 Other chronic pain: Secondary | ICD-10-CM | POA: Insufficient documentation

## 2012-04-29 DIAGNOSIS — K219 Gastro-esophageal reflux disease without esophagitis: Secondary | ICD-10-CM | POA: Insufficient documentation

## 2012-04-29 DIAGNOSIS — F209 Schizophrenia, unspecified: Secondary | ICD-10-CM | POA: Insufficient documentation

## 2012-04-29 DIAGNOSIS — M1990 Unspecified osteoarthritis, unspecified site: Secondary | ICD-10-CM

## 2012-04-29 DIAGNOSIS — Z981 Arthrodesis status: Secondary | ICD-10-CM | POA: Insufficient documentation

## 2012-04-29 HISTORY — DX: Psoriasis, unspecified: L40.9

## 2012-04-29 LAB — COMPREHENSIVE METABOLIC PANEL
Albumin: 3.7 g/dL (ref 3.5–5.2)
Alkaline Phosphatase: 106 U/L (ref 39–117)
BUN: 13 mg/dL (ref 6–23)
Creatinine, Ser: 0.88 mg/dL (ref 0.50–1.10)
Glucose, Bld: 133 mg/dL — ABNORMAL HIGH (ref 70–99)
Total Bilirubin: 0.3 mg/dL (ref 0.3–1.2)

## 2012-04-29 LAB — BASIC METABOLIC PANEL
BUN: 12 mg/dL (ref 6–23)
GFR calc Af Amer: 77 mL/min — ABNORMAL LOW (ref >90)
GFR calc non Af Amer: 66 mL/min — ABNORMAL LOW (ref >90)
Potassium: 3.6 mEq/L (ref 3.5–5.1)

## 2012-04-29 LAB — CBC WITH DIFFERENTIAL/PLATELET
Basophils Absolute: 0 10*3/uL (ref 0.0–0.1)
Eosinophils Absolute: 0.1 10*3/uL (ref 0.0–0.5)
HGB: 11.8 g/dL (ref 11.6–15.9)
LYMPH%: 19.9 % (ref 14.0–49.7)
MCV: 95.5 fL (ref 79.5–101.0)
MONO%: 5.5 % (ref 0.0–14.0)
NEUT#: 4.5 10*3/uL (ref 1.5–6.5)
NEUT%: 72.6 % (ref 38.4–76.8)
Platelets: 230 10*3/uL (ref 145–400)

## 2012-04-29 LAB — PROTIME-INR: INR: 2.1 (ref 2.00–3.50)

## 2012-04-29 MED ORDER — KETOROLAC TROMETHAMINE 30 MG/ML IJ SOLN
30.0000 mg | Freq: Once | INTRAMUSCULAR | Status: AC
  Administered 2012-04-29: 30 mg via INTRAVENOUS
  Filled 2012-04-29: qty 1

## 2012-04-29 MED ORDER — IBUPROFEN 800 MG PO TABS: 800.0000 mg | ORAL_TABLET | ORAL | Status: AC | PRN

## 2012-04-29 MED ORDER — IBUPROFEN 800 MG PO TABS: 800.0000 mg | ORAL_TABLET | ORAL | Status: DC | PRN

## 2012-04-29 NOTE — Telephone Encounter (Signed)
appts made and printed for pt,pt will stop by [place] to make her own new pt appt   aom

## 2012-04-29 NOTE — ED Provider Notes (Signed)
History     CSN: [id number]  Arrival date & time 04/29/12  1710   First MD Initiated Contact with Patient 04/29/12 2024      Chief Complaint  Patient presents with  . bilat leg pain-sent by dr. Cyndie Chime     (Consider location/radiation/quality/duration/timing/severity/associated sxs/prior treatment) HPI Comments: [name] [name] 71 y.o. female   The chief complaint is: Patient presents with:   bilat leg pain-sent by dr. Cyndie Chime    The patient has medical history significant for:   Past Medical History:   Depression                                                   Schizophrenia                                                Arthritis                                                    Hyperlipidemia                                               Hypertension                                                 Chronic pain                                                 Pulmonary embolism                              12/2009         Comment:Large central bilateral PE's    DVT (deep venous thrombosis)                                   Comment:per 01/17/10 d/c summary- "remote hx of dvt"?   Iron deficiency anemia                                       Chronic headaches                                            Sleep apnea  Glaucoma                                                     Hemorrhoids                                                  Asthma                                                       Anxiety                                                      GERD (gastroesophageal reflux disease)                       Psoriasis                                                    Psoriasis                                       04/29/2012       Comment:New onset evaluated by Dr Marylou Flesher,               Dermatology, [facility] 6/13  Rx 0.1%               Tacrolimus ointment   Patient presents for pain management of her  arthritis. Patient states she wants a "shot of something" for the pain. She describes the pain as being localized to her legs and constant. Due to her psych history her chart states that narcotics not be given. She has no PCP but plans to establish care soon. Denies fever, chills, night sweats. Denies NVD, abdominal pain. Denies CP, SOB, palpitations.  The history is provided by the patient.    Past Medical History  Diagnosis Date  . Depression   . Schizophrenia   . Arthritis   . Hyperlipidemia   . Hypertension   . Chronic pain   . 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 DVT (deep venous thrombosis)     per 01/17/10 d/c summary- "remote hx of dvt"?  . Iron deficiency anemia   . Chronic headaches   . Sleep apnea   . Glaucoma   . Hemorrhoids   . Asthma   . Anxiety   . GERD (gastroesophageal reflux disease)   . Psoriasis   . Psoriasis 04/29/2012    New onset evaluated by Dr Marylou Flesher, Dermatology, [facility] 6/13  Rx 0.1% Tacrolimus ointment    Past Surgical History  Procedure Date  .  Spinal fusion     2004  . Joint replacement right knee 11/2008  . Tubal ligation year 49  . Carpal tunnel release     bilateral     Family History  Problem Relation Age of Onset  . Diabetes Sister   . Colon cancer Brother 40    History  Substance Use Topics  . Smoking status: Never Smoker   . Smokeless tobacco: Never Used  . Alcohol Use: No    OB History    Grav Para Term Preterm Abortions TAB SAB Ect Mult Living                  Review of Systems  Constitutional: Negative for fever and chills.  Respiratory: Negative for shortness of breath.   Cardiovascular: Positive for leg swelling. Negative for chest pain and palpitations.  Gastrointestinal: Negative for nausea, vomiting, abdominal pain and diarrhea.  Musculoskeletal: Positive for arthralgias.    Allergies  Adhesive  Home Medications   Current Outpatient Rx  Name Route Sig Dispense Refill   . ALBUTEROL SULFATE HFA 108 (90 BASE) MCG/ACT IN AERS Inhalation Inhale 2 puffs into the lungs 2 (two) times daily as needed. For shortness of breath and wheezing    . ATENOLOL 100 MG PO TABS Oral Take 100 mg by mouth daily.    Marland Kitchen CLOBETASOL PROPIONATE 0.05 % EX OINT Topical Apply 1 application topically 2 (two) times daily.    Marland Kitchen CLONAZEPAM 0.5 MG PO TABS Oral Take 0.5 mg by mouth 2 (two) times daily as needed. For anxiety    . DIPHENHYDRAMINE HCL 25 MG PO CAPS Oral Take 50 mg by mouth every 6 (six) hours as needed.    Marland Kitchen ESOMEPRAZOLE MAGNESIUM 40 MG PO CPDR Oral Take 40 mg by mouth daily.     Marland Kitchen FERROUS SULFATE 325 (65 FE) MG PO TABS Oral Take 325 mg by mouth 2 (two) times daily with a meal.    . FLUOXETINE HCL 20 MG PO CAPS Oral Take 20 mg by mouth 3 (three) times daily.     Marland Kitchen FLUTICASONE PROPIONATE  HFA 220 MCG/ACT IN AERO Inhalation Inhale 2 puffs into the lungs 2 (two) times daily.     . FUROSEMIDE 40 MG PO TABS Oral Take 40 mg by mouth daily as needed. For increased fluid in legs    . GABAPENTIN 100 MG PO CAPS Oral Take 200 mg by mouth at bedtime as needed. For pain    . HYDROCHLOROTHIAZIDE 25 MG PO TABS Oral Take 25 mg by mouth daily. For high blood pressure     . HYDROXYZINE HCL 25 MG PO TABS Oral Take 25 mg by mouth 3 (three) times daily as needed.    . ARTIFICIAL TEARS OP Ophthalmic Apply 1 drop to eye 4 (four) times daily. Dextran 70/hypromellose    . MECLIZINE HCL 25 MG PO TABS Oral Take 25 mg by mouth every 8 (eight) hours as needed. For vertigo    . MONTELUKAST SODIUM 10 MG PO TABS Oral Take 10 mg by mouth at bedtime.    Marland Kitchen POTASSIUM CHLORIDE CRYS ER 20 MEQ PO TBCR Oral Take 20 mEq by mouth daily.      Marland Kitchen TACROLIMUS 0.1 % EX OINT Topical Apply 1 application topically 2 (two) times daily.    . TRAMADOL HCL 50 MG PO TABS Oral Take 50 mg by mouth 3 (three) times daily as needed. For pain    . TRIAMCINOLONE ACETONIDE 0.1 % EX CREA Topical  Apply 1 application topically 2 (two) times daily.     . WARFARIN SODIUM 5 MG PO TABS Oral Take 5 mg by mouth daily.      BP 133/79  Pulse 93  Temp 99 F (37.2 C)  Resp 20  Wt 234 lb (106.142 kg)  SpO2 99%  Physical Exam  Nursing note and vitals reviewed. Constitutional: She appears well-developed and well-nourished.  HENT:  Head: Normocephalic and atraumatic.  Mouth/Throat: Oropharynx is clear and moist.  Neck: Normal range of motion. Neck supple.  Cardiovascular: Normal rate, regular rhythm and normal heart sounds.   Pulmonary/Chest: Effort normal and breath sounds normal.  Abdominal: Soft. Bowel sounds are normal.  Musculoskeletal: Normal range of motion. She exhibits edema and tenderness.       Tenderness noted on palpation of the patella.  Non-pitting lower extremity edema noted consistent with patient history of venous stasis and varicose veins.  Neurological: She is alert.  Skin: Skin is warm and dry.    ED Course  Procedures (including critical care time)  Labs Reviewed - No data to display No results found.   1. Arthritis       MDM  Patient presented for treatment of her acute arthritis pain. Patient given Toradol in the ED with improvement of symptoms. Patient discharged on Ultram for acute management until PCP established. Patient has no red flags for septic arthritis. Return precautions given in discharge instructions.        Pixie Casino, PA-C 04/30/12 0215

## 2012-04-29 NOTE — Progress Notes (Signed)
Hematology and Oncology Follow Up Visit  [name] [name] [id number] 1941/03/19 71 y.o. 04/29/2012 4:05 PM   Principle Diagnosis: Encounter Diagnoses  Name Primary?  . Chronic anticoagulation   . PULMONARY EMBOLISM   . Iron deficiency anemia, unspecified   . HYPERLIPIDEMIA   . Obesity, unspecified   . Essential hypertension, benign Yes  . ASTHMA, PERSISTENT   . Varicose veins of both lower extremities with pain   . GERD   . Psoriasis      Interim History:   Followup visit for this 71 year old woman referred here for advice on long-term anticoagulation. She sustained an unprovoked pulmonary embolism April 2011. She was found to be a heterozygote for factor V Leiden gene mutation. Other things being equal, I would not have recommended lifelong anticoagulation but she has a number of other significant risk factors including extensive varicose veins of her bilateral lower extremities, hypertension, and obesity. She requested that we monitor her Coumadin through our office. She has been on 5 mg with low therapeutic INRs which have been stable since her first visit here in February of 2012. A few months after her visit here in May of 2012 she was admitted to the hospital for a syncopal episode. I cannot find a discharge summary associated with that admission. She was admitted again on January 13 of this year for profuse bleeding from one of the right leg varicose veins. INR only 2.7 on admission. Bleeding was self-limited. Coumadin was continued and she was discharged. She was admitted again on March 19 with hematochezia dizziness and presyncope. On rectal exam she had grossly heme-positive stool. Initial hemoglobin was 11.1. INR was 2.1. Upper endoscopy was planned but I do not find a result in Epic-what else is new. We had also been encouraging her since her first visit here to get a followup colonoscopy in view of a strong family history of colon cancer. We made a referral to outpatient GI with Dr.  Russella Dar and the patient never followed through.  Additional problems since last visit here in April was acute onset of a dermatitis evaluated at [facility] by Dr. Marylou Flesher, a dermatologist. Psoriasis was diagnosed and she was put on a combination of topical steroid cream and 0.1% tacrolimus ointment.  She denies any dyspnea chest pain or palpitations. No more episodes of hematochezia.     Medications: reviewed  Allergies:  Allergies  Allergen Reactions  . Adhesive (Tape) Rash and Other (See Comments)    Pulls skin off    Review of Systems: Constitutional:  Chronic fatigue  Respiratory: See above Cardiovascular:  See above Gastrointestinal: Currently no abdominal pain Genito-Urinary: Not questioned Musculoskeletal: Neurologic: Ongoing followup with her psychiatrist for her depression and bipolar disorder Skin: See above Remaining ROS negative.  Physical Exam: Blood pressure 150/96, pulse 87, temperature 97.8 F (36.6 C), temperature source Oral, resp. rate 20, height 5\' 6"  (1.676 m), weight 237 lb 14.4 oz (107.911 kg). Wt Readings from Last 3 Encounters:  04/29/12 237 lb 14.4 oz (107.911 kg)  02/15/12 240 lb (108.863 kg)  01/03/12 240 lb (108.863 kg)     General appearance: Pleasant obese African American woman HENNT: Pharynx no erythema exudate or ulcer Lymph nodes: No adenopathy Breasts: Not examined Lungs: Clear to auscultation resonant to percussion Heart: Regular rhythm no murmur Abdomen: Soft obese nontender Extremities: Extensive bilateral varicose veins; extensive venous stasis changes left tibial area; no calf tenderness Vascular: No cyanosis Neurologic: Motor strength 5 over 5 Skin: Resolving annular patches  of psoriasis  Lab Results: Lab Results  Component Value Date   WBC 6.3 04/29/2012   HGB 11.8 04/29/2012   HCT 35.4 04/29/2012   MCV 95.5 04/29/2012   PLT 230 04/29/2012     Chemistry      Component Value Date/Time   NA 139 12/19/2011 1903    K 3.2* 12/19/2011 1903   CL 100 12/19/2011 1903   CO2 30 12/19/2011 1903   BUN 7 12/19/2011 1903   CREATININE 0.72 12/19/2011 1903   CREATININE 0.91 11/08/2010 1518      Component Value Date/Time   CALCIUM 9.7 12/19/2011 1903   ALKPHOS 87 12/11/2011 1220   AST 23 12/11/2011 1220   ALT 13 12/11/2011 1220   BILITOT 0.2* 12/11/2011 1220    Pro time 25.2 seconds INR 2.1 on Coumadin 5 mg daily   Radiological Studies: No results found.  Impression and Plan:  1. Unprovoked pulmonary emboli April 2011. She is maintained on chronic Coumadin anticoagulation followed through our office. INR remained stable on 5 mg of Coumadin. We discussed the new anticoagulants. I am reluctant to put her on one of the new drugs since we still don't have an incidental. She has frequent hospitalizations with bleeding episodes and I think it would be a disaster she was on a long-acting drug that we couldn't reverse. 2. Heterozygote factor V Leiden gene mutation. 3. Family history of colon cancer. She is once again strongly advised to follow through with the colonoscopy. 4. Normochromic anemia. Hemoglobin 7 remained stable now for 2 years of observation. 5. Varicose veins. Ongoing problem 6. History of paroxysmal supraventricular tachycardia. 7. Obesity, sleep apnea syndrome. 8. Essential hypertension. 9. Degenerative arthritis. 10. Chronic undifferentiated schizophrenia. 11.  Diagnosis of psoriasis 6/13 12.  13. She is unhappy with her current primary care physician and would like to change. And going to make a referral to [facility] since she is already seeing anumber of other [facility] specialists.  CC:. Dr. Claudette Head; Dr. Rollene Rotunda;    Levert Feinstein, MD 8/6/20134:05 PM

## 2012-04-29 NOTE — ED Notes (Signed)
Pt ambulatory to room in triage.

## 2012-04-29 NOTE — Progress Notes (Signed)
INR at goal on Coumadin 5mg  daily.  Addition of Protopic 0.1% oint for psoriasis.  Pt is still taking Prozac.  No changes to coumadin.  Will check PT/INR in 1 month.  Pt has appt with Dr. Cyndie Chime today.

## 2012-04-29 NOTE — ED Notes (Signed)
Pt states "I have arthritis in my legs, saw Dr. Cyndie Chime today, he sent me somewhere else to help me get a primary MD, I haven't had a doctor for a while, nobody's been tx'g my arthritis, my legs hurt me so bad"

## 2012-04-30 ENCOUNTER — Telehealth: Payer: Self-pay | Admitting: Internal Medicine

## 2012-04-30 MED ORDER — TRAMADOL HCL 50 MG PO TABS: 50.0000 mg | ORAL_TABLET | Freq: Four times a day (QID) | ORAL | Status: AC | PRN

## 2012-04-30 NOTE — Telephone Encounter (Signed)
Message copied by Etheleen Sia on Wed Apr 30, 2012  1:25 PM ------      Message from: Rene Paci A      Created: Wed Apr 30, 2012  8:12 AM      Regarding: RE: NEW PT       Not me -      thanks      ----- Message -----         From: Etheleen Sia         Sent: 04/29/2012   3:41 PM           To: Newt Lukes, MD      Subject: NEW PT                                                   Dr Cyndie Chime told Ms Hadley to inquire about becoming a patient here.  Would you take her as a patient?  She has 20 No-Shows listed in the system.  She has regular Medicare.            [phone] or cell [phone]

## 2012-04-30 NOTE — Telephone Encounter (Signed)
Pt is aware.  

## 2012-05-02 NOTE — ED Provider Notes (Signed)
Medical screening examination/treatment/procedure(s) were performed by non-physician practitioner and as supervising physician I was immediately available for consultation/collaboration.  Cheri Guppy, MD 05/02/12 [phone]

## 2012-05-05 ENCOUNTER — Telehealth: Payer: Self-pay | Admitting: *Deleted

## 2012-05-05 NOTE — Telephone Encounter (Signed)
Received call from pt asking Dr. Cyndie Chime to make a recommendation of a PCP.  Not able to get an appt with [facility]--she stated "they told me they are not taking any new pt"

## 2012-05-06 ENCOUNTER — Telehealth: Payer: Self-pay | Admitting: *Deleted

## 2012-05-06 NOTE — Telephone Encounter (Signed)
Spoke with pt and gave her [facility] [phone] to call; they will be accepting new pt Sept/Oct.  Pt verbalized understanding that she has to call and put her name of list for new pt.

## 2012-05-19 ENCOUNTER — Encounter (HOSPITAL_COMMUNITY): Payer: Self-pay

## 2012-05-19 ENCOUNTER — Emergency Department (HOSPITAL_COMMUNITY)
Admission: EM | Admit: 2012-05-19 | Discharge: 2012-05-19 | Disposition: A | Discharge status: Home / Self Care | Payer: Medicare Other | Attending: Emergency Medicine | Admitting: Emergency Medicine

## 2012-05-19 DIAGNOSIS — Z86718 Personal history of other venous thrombosis and embolism: Secondary | ICD-10-CM | POA: Insufficient documentation

## 2012-05-19 DIAGNOSIS — M25569 Pain in unspecified knee: Secondary | ICD-10-CM | POA: Insufficient documentation

## 2012-05-19 DIAGNOSIS — G8929 Other chronic pain: Secondary | ICD-10-CM

## 2012-05-19 DIAGNOSIS — F209 Schizophrenia, unspecified: Secondary | ICD-10-CM | POA: Insufficient documentation

## 2012-05-19 DIAGNOSIS — Z86711 Personal history of pulmonary embolism: Secondary | ICD-10-CM | POA: Insufficient documentation

## 2012-05-19 DIAGNOSIS — G4730 Sleep apnea, unspecified: Secondary | ICD-10-CM | POA: Insufficient documentation

## 2012-05-19 DIAGNOSIS — Z7901 Long term (current) use of anticoagulants: Secondary | ICD-10-CM | POA: Insufficient documentation

## 2012-05-19 DIAGNOSIS — M549 Dorsalgia, unspecified: Secondary | ICD-10-CM | POA: Insufficient documentation

## 2012-05-19 DIAGNOSIS — K219 Gastro-esophageal reflux disease without esophagitis: Secondary | ICD-10-CM | POA: Insufficient documentation

## 2012-05-19 DIAGNOSIS — I10 Essential (primary) hypertension: Secondary | ICD-10-CM | POA: Insufficient documentation

## 2012-05-19 DIAGNOSIS — M129 Arthropathy, unspecified: Secondary | ICD-10-CM | POA: Insufficient documentation

## 2012-05-19 DIAGNOSIS — D509 Iron deficiency anemia, unspecified: Secondary | ICD-10-CM | POA: Insufficient documentation

## 2012-05-19 DIAGNOSIS — E785 Hyperlipidemia, unspecified: Secondary | ICD-10-CM | POA: Insufficient documentation

## 2012-05-19 DIAGNOSIS — Z79899 Other long term (current) drug therapy: Secondary | ICD-10-CM | POA: Insufficient documentation

## 2012-05-19 MED ORDER — HYDROMORPHONE HCL PF 1 MG/ML IJ SOLN
1.0000 mg | Freq: Once | INTRAMUSCULAR | Status: AC
  Administered 2012-05-19: 1 mg via INTRAMUSCULAR
  Filled 2012-05-19: qty 1

## 2012-05-19 MED ORDER — KETOROLAC TROMETHAMINE 15 MG/ML IJ SOLN
15.0000 mg | Freq: Once | INTRAMUSCULAR | Status: DC
  Filled 2012-05-19: qty 1

## 2012-05-19 MED ORDER — PREDNISONE 20 MG PO TABS: 40.0000 mg | ORAL_TABLET | Freq: Every day | ORAL | Status: AC

## 2012-05-19 MED ORDER — KETOROLAC TROMETHAMINE 30 MG/ML IJ SOLN
INTRAMUSCULAR | Status: AC
  Administered 2012-05-19: 15 mg
  Filled 2012-05-19: qty 1

## 2012-05-19 NOTE — Discharge Instructions (Signed)
 Chronic Back Pain When back pain lasts longer than 3 months, it is called chronic back pain.This pain can be frustrating, but the cause of the pain is rarely dangerous.People with chronic back pain often go through certain periods that are more intense (flare-ups). CAUSES Chronic back pain can be caused by wear and tear (degeneration) on different structures in your back. These structures may include bones, ligaments, or discs. This degeneration may result in more pressure being placed on the nerves that travel to your legs and feet. This can lead to pain traveling from the low back down the back of the legs. When pain lasts longer than 3 months, it is not unusual for people to experience anxiety or depression. Anxiety and depression can also contribute to low back pain. TREATMENT  Establish a regular exercise plan. This is critical to improving your functional level.   Have a self-management plan for when you flare-up. Flare-ups rarely require a medical visit. Regular exercise will help reduce the intensity and frequency of your flare-ups.   Manage how you feel about your back pain and the rest of your life. Anxiety, depression, and feeling that you cannot alter your back pain have been shown to make back pain more intense and debilitating.   Medicines should never be your only treatment. They should be used along with other treatments to help you return to a more active lifestyle.   Procedures such as injections or surgery may be helpful but are rarely necessary. You may be able to get the same results with physical therapy or chiropractic care.  HOME CARE INSTRUCTIONS  Avoid bending, heavy lifting, prolonged sitting, and activities which make the problem worse.   Continue normal activity as much as possible.   Take brief periods of rest throughout the day to reduce your pain during flare-ups.   Follow your back exercise rehabilitation program. This can help reduce symptoms and prevent  more pain.   Only take over-the-counter or prescription medicines as directed by your caregiver. Muscle relaxants are sometimes prescribed. Narcotic pain medicine is discouraged for long-term pain, since addiction is a possible outcome.   If you smoke, quit.   Eat healthy foods and maintain a recommended body weight.  SEEK IMMEDIATE MEDICAL CARE IF:   You have weakness or numbness in one of your legs or feet.   You have trouble controlling your bladder or bowels.   You develop nausea, vomiting, abdominal pain, shortness of breath, or fainting.  Document Released: 10/18/2004 Document Revised: 08/30/2011 Document Reviewed: 08/25/2011 [facility] Patient Information 2012 [place], [place].

## 2012-05-19 NOTE — ED Notes (Signed)
Here for pain medications relateed to psoriasis.

## 2012-05-19 NOTE — ED Provider Notes (Signed)
 History   This chart was scribed for [name] [name]. Oletta Lamas, MD by Melba Coon. The patient was seen in room TR06C/TR06C and the patient's care was started at 2:00PM.    CSN: [id number]  Arrival date & time 05/19/12  1301   First MD Initiated Contact with Patient 05/19/12 1346      Chief Complaint  Patient presents with  . Psoriasis    (Consider location/radiation/quality/duration/timing/severity/associated sxs/prior treatment) HPI [name] [name] is a 71 y.o. female who presents to the Emergency Department complaining of constant, moderate to severe back pain and right knee pain with an onset today. Pt states that she has had chronic back and right knee pain. Pt went short-term pain relief until she can get an appointment with a new PCP at [facility]. No falls or trauma to the affected area. No HA, fever, neck pain, sore throat, CP, SOB, abd pain, n/v/d, dysuria, or extremity edema, weakness, numbness, or tingling. Pt also c/o rash on bilateral legs and left elbow that is c/w Hx of psoriasis. No other pertinent medical symptoms.  Past Medical History  Diagnosis Date  . Depression   . Schizophrenia   . Arthritis   . Hyperlipidemia   . Hypertension   . Chronic pain   . 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 DVT (deep venous thrombosis)     per 01/17/10 d/c summary- "remote hx of dvt"?  . Iron deficiency anemia   . Chronic headaches   . Sleep apnea   . Glaucoma   . Hemorrhoids   . Asthma   . Anxiety   . GERD (gastroesophageal reflux disease)   . Psoriasis   . Psoriasis 04/29/2012    New onset evaluated by Dr Marylou Flesher, Dermatology, [facility] 6/13  Rx 0.1% Tacrolimus ointment    Past Surgical History  Procedure Date  . Spinal fusion     2004  . Joint replacement right knee 11/2008  . Tubal ligation year 35  . Carpal tunnel release     bilateral     Family History  Problem Relation Age of Onset  . Diabetes Sister   . Colon cancer  Brother 40    History  Substance Use Topics  . Smoking status: Never Smoker   . Smokeless tobacco: Never Used  . Alcohol Use: No    OB History    Grav Para Term Preterm Abortions TAB SAB Ect Mult Living                  Review of Systems 10 Systems reviewed and all are negative for acute change except as noted in the HPI.   Allergies  Codeine; Tramadol; and Adhesive  Home Medications   Current Outpatient Rx  Name Route Sig Dispense Refill  . ALBUTEROL SULFATE HFA 108 (90 BASE) MCG/ACT IN AERS Inhalation Inhale 2 puffs into the lungs 2 (two) times daily as needed. For shortness of breath and wheezing    . ATENOLOL 100 MG PO TABS Oral Take 100 mg by mouth daily.    Marland Kitchen CAMPHOR-MENTHOL 0.5-0.5 % EX LOTN Topical Apply 1 application topically 2 (two) times daily.    Marland Kitchen CLOBETASOL PROPIONATE 0.05 % EX OINT Topical Apply 1 application topically 2 (two) times daily.    Marland Kitchen CLONAZEPAM 0.5 MG PO TABS Oral Take 0.5 mg by mouth 2 (two) times daily as needed. For anxiety    . ESOMEPRAZOLE MAGNESIUM 40 MG PO CPDR Oral Take 40  mg by mouth daily.     Marland Kitchen FERROUS SULFATE 325 (65 FE) MG PO TABS Oral Take 325 mg by mouth 2 (two) times daily with a meal.    . FLUOXETINE HCL 20 MG PO CAPS Oral Take 60 mg by mouth daily.    Marland Kitchen FLUTICASONE PROPIONATE  HFA 110 MCG/ACT IN AERO Inhalation Inhale 2 puffs into the lungs 2 (two) times daily.    Marland Kitchen HYDROCHLOROTHIAZIDE 25 MG PO TABS Oral Take 25 mg by mouth daily. For high blood pressure     . HYDROXYZINE HCL 25 MG PO TABS Oral Take 25 mg by mouth 3 (three) times daily as needed. itching    . ARTIFICIAL TEARS OP Ophthalmic Apply 1 drop to eye 4 (four) times daily. Dextran 70/hypromellose    . MECLIZINE HCL 25 MG PO TABS Oral Take 25 mg by mouth every 8 (eight) hours as needed. For vertigo    . POTASSIUM CHLORIDE CRYS ER 20 MEQ PO TBCR Oral Take 20 mEq by mouth daily.      Marland Kitchen PRESCRIPTION MEDICATION Topical Apply 1 application topically 2 (two) times daily. Cream  compounded at [facility]  riamcinolone 0.1%, silver sulfadiazine 1%   3-1    . TACROLIMUS 0.1 % EX OINT Topical Apply 1 application topically 2 (two) times daily.    . TRIAMCINOLONE ACETONIDE 0.1 % EX CREA Topical Apply 1 application topically 2 (two) times daily.    . WARFARIN SODIUM 5 MG PO TABS Oral Take 5 mg by mouth daily.    Marland Kitchen PREDNISONE 20 MG PO TABS Oral Take 2 tablets (40 mg total) by mouth daily. 14 tablet 0    BP 148/84  Pulse 61  Temp 98.2 F (36.8 C) (Oral)  Resp 18  SpO2 99%  Physical Exam  Nursing note and vitals reviewed. Constitutional: She is oriented to person, place, and time. She appears well-developed and well-nourished. No distress.  HENT:  Head: Normocephalic and atraumatic.  Eyes: EOM are normal.  Neck: Neck supple. No tracheal deviation present.  Cardiovascular: Normal rate.   Pulmonary/Chest: Effort normal. No respiratory distress.  Musculoskeletal: Normal range of motion. She exhibits tenderness (mild tenderness to the right knee and paraspinal lumbar area).       Nml ROM of bilateral lower extremities.  Neurological: She is alert and oriented to person, place, and time.       Nml strength and sensation of lower extremities.  Skin: Skin is warm and dry. Rash (chronic rash over lower extremities c/w hx of psoriasis) noted.  Psychiatric: She has a normal mood and affect. Her behavior is normal.    ED Course  Procedures (including critical care time)  DIAGNOSTIC STUDIES: Oxygen Saturation is 99% on room air, normal by my interpretation.    COORDINATION OF CARE:  2:07PM - prednisone, Toradol, and dilaudid will be Rx for the pt. Pt ready for d/c.   Labs Reviewed - No data to display No results found.   1. Chronic knee pain   2. Chronic back pain     Pt is trying to establish with [facility] PCP.  She wants a shot of pain meds for her knee and back pain that she has had chronically.  No fever, no new injury.  She initially didn't want a prescription,  she is not under pain management, unclear why she didn't want one. I have explained to her again that ED is not appropriate for chronic pain.  Pt is seen standing, walking with cane and  bending over a chair to put things in her purse with no difficulty.    MDM  I personally performed the services described in this documentation, which was scribed in my presence. The recorded information has been reviewed and considered.         [name] [name]. Laray Rivkin, MD 05/20/12 1651

## 2012-05-28 ENCOUNTER — Other Ambulatory Visit: Payer: Medicare Other | Admitting: Lab

## 2012-05-28 ENCOUNTER — Ambulatory Visit: Payer: Medicare Other

## 2012-05-29 ENCOUNTER — Emergency Department (HOSPITAL_COMMUNITY)
Admission: EM | Admit: 2012-05-29 | Discharge: 2012-05-29 | Disposition: A | Discharge status: Home / Self Care | Payer: Medicare Other | Attending: Emergency Medicine | Admitting: Emergency Medicine

## 2012-05-29 ENCOUNTER — Encounter (HOSPITAL_COMMUNITY): Payer: Self-pay | Admitting: *Deleted

## 2012-05-29 DIAGNOSIS — Z981 Arthrodesis status: Secondary | ICD-10-CM | POA: Insufficient documentation

## 2012-05-29 DIAGNOSIS — G4730 Sleep apnea, unspecified: Secondary | ICD-10-CM | POA: Insufficient documentation

## 2012-05-29 DIAGNOSIS — F329 Major depressive disorder, single episode, unspecified: Secondary | ICD-10-CM | POA: Insufficient documentation

## 2012-05-29 DIAGNOSIS — E785 Hyperlipidemia, unspecified: Secondary | ICD-10-CM | POA: Insufficient documentation

## 2012-05-29 DIAGNOSIS — F209 Schizophrenia, unspecified: Secondary | ICD-10-CM | POA: Insufficient documentation

## 2012-05-29 DIAGNOSIS — Z86718 Personal history of other venous thrombosis and embolism: Secondary | ICD-10-CM | POA: Insufficient documentation

## 2012-05-29 DIAGNOSIS — I10 Essential (primary) hypertension: Secondary | ICD-10-CM | POA: Insufficient documentation

## 2012-05-29 DIAGNOSIS — Z79899 Other long term (current) drug therapy: Secondary | ICD-10-CM | POA: Insufficient documentation

## 2012-05-29 DIAGNOSIS — K219 Gastro-esophageal reflux disease without esophagitis: Secondary | ICD-10-CM | POA: Insufficient documentation

## 2012-05-29 DIAGNOSIS — J45909 Unspecified asthma, uncomplicated: Secondary | ICD-10-CM | POA: Insufficient documentation

## 2012-05-29 DIAGNOSIS — F411 Generalized anxiety disorder: Secondary | ICD-10-CM | POA: Insufficient documentation

## 2012-05-29 DIAGNOSIS — R58 Hemorrhage, not elsewhere classified: Secondary | ICD-10-CM

## 2012-05-29 DIAGNOSIS — I8390 Asymptomatic varicose veins of unspecified lower extremity: Secondary | ICD-10-CM

## 2012-05-29 DIAGNOSIS — F3289 Other specified depressive episodes: Secondary | ICD-10-CM | POA: Insufficient documentation

## 2012-05-29 DIAGNOSIS — Z86711 Personal history of pulmonary embolism: Secondary | ICD-10-CM | POA: Insufficient documentation

## 2012-05-29 DIAGNOSIS — I83893 Varicose veins of bilateral lower extremities with other complications: Secondary | ICD-10-CM | POA: Insufficient documentation

## 2012-05-29 MED ORDER — ACETAMINOPHEN 325 MG PO TABS
650.0000 mg | ORAL_TABLET | Freq: Once | ORAL | Status: AC
  Administered 2012-05-29: 650 mg via ORAL
  Filled 2012-05-29: qty 2

## 2012-05-29 MED ORDER — IBUPROFEN 200 MG PO TABS
600.0000 mg | ORAL_TABLET | Freq: Once | ORAL | Status: AC
  Administered 2012-05-29: 600 mg via ORAL
  Filled 2012-05-29: qty 3

## 2012-05-29 NOTE — ED Notes (Signed)
Here with son who is a pt, arrived by EMS, husband arrived by POV, as a visitor pt went out to w/r to get a snack out of machine, while walking, R lateral foot started bleeding, denies trauma or hitting foot, pt has varicose veins and is on coumadin. States, "started bleeding just from being on my feet a lot". bleeding controlled. Wrapped with kling by nurse first prior to triage. Some blood noted on inner kling. Will continue to monitor. Mentions some dizziness (for 3d) and chronic intermitant BLE pain. CMS intact.

## 2012-05-29 NOTE — ED Notes (Signed)
Silver nitrate applied by Dr. Rosalia Hammers, wound bandaged with gauze.

## 2012-05-29 NOTE — ED Provider Notes (Signed)
History     CSN: [id number]  Arrival date & time 05/29/12  0240   First MD Initiated Contact with Patient 05/29/12 0510      Chief Complaint  Patient presents with  . Coagulation Disorder    bleeding on coumadin    (Consider location/radiation/quality/duration/timing/severity/associated sxs/prior treatment) HPI Patient with bleeding in the right lower lateral leg area. She is on anticoagulants. She states that she does not know that she had any trauma to this area. She does have known varicose veins. She states it just began bleeding. She has not been having any problems with her INR and has not had any changes in this. She was here at the hospital because her son had a seizure. It began bleeding she was assessed and placed in a room here. She's not having any numbness tingling or weakness. Past Medical History  Diagnosis Date  . Depression   . Schizophrenia   . Arthritis   . Hyperlipidemia   . Hypertension   . Chronic pain   . 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 DVT (deep venous thrombosis)     per 01/17/10 d/c summary- "remote hx of dvt"?  . Iron deficiency anemia   . Chronic headaches   . Sleep apnea   . Glaucoma   . Hemorrhoids   . Asthma   . Anxiety   . GERD (gastroesophageal reflux disease)   . Psoriasis   . Psoriasis 04/29/2012    New onset evaluated by Dr Marylou Flesher, Dermatology, [facility] 6/13  Rx 0.1% Tacrolimus ointment    Past Surgical History  Procedure Date  . Spinal fusion     2004  . Joint replacement right knee 11/2008  . Tubal ligation year 49  . Carpal tunnel release     bilateral     Family History  Problem Relation Age of Onset  . Diabetes Sister   . Colon cancer Brother 40    History  Substance Use Topics  . Smoking status: Never Smoker   . Smokeless tobacco: Never Used  . Alcohol Use: No    OB History    Grav Para Term Preterm Abortions TAB SAB Ect Mult Living                  Review of Systems    Constitutional: Negative for fever, chills, activity change, appetite change and unexpected weight change.  HENT: Negative for sore throat, rhinorrhea, neck pain, neck stiffness and sinus pressure.   Eyes: Negative for visual disturbance.  Respiratory: Negative for cough and shortness of breath.   Cardiovascular: Negative for chest pain and leg swelling.  Gastrointestinal: Negative for vomiting, abdominal pain, diarrhea and blood in stool.  Genitourinary: Negative for dysuria, urgency, frequency, vaginal discharge and difficulty urinating.  Musculoskeletal: Negative for myalgias, arthralgias and gait problem.  Skin: Negative for color change and rash.  Neurological: Negative for weakness, light-headedness and headaches.  Hematological: Does not bruise/bleed easily.  Psychiatric/Behavioral: Negative for dysphoric mood.    Allergies  Codeine; Tramadol; and Adhesive  Home Medications   Current Outpatient Rx  Name Route Sig Dispense Refill  . ALBUTEROL SULFATE HFA 108 (90 BASE) MCG/ACT IN AERS Inhalation Inhale 2 puffs into the lungs 2 (two) times daily as needed. For shortness of breath and wheezing    . ATENOLOL 100 MG PO TABS Oral Take 100 mg by mouth daily.    Marland Kitchen CAMPHOR-MENTHOL 0.5-0.5 % EX LOTN Topical Apply 1 application  topically 2 (two) times daily.    Marland Kitchen CLOBETASOL PROPIONATE 0.05 % EX OINT Topical Apply 1 application topically 2 (two) times daily.    Marland Kitchen CLONAZEPAM 0.5 MG PO TABS Oral Take 0.5 mg by mouth 2 (two) times daily as needed. For anxiety    . ESOMEPRAZOLE MAGNESIUM 40 MG PO CPDR Oral Take 40 mg by mouth daily.     Marland Kitchen FERROUS SULFATE 325 (65 FE) MG PO TABS Oral Take 325 mg by mouth 2 (two) times daily with a meal.    . FLUOXETINE HCL 20 MG PO CAPS Oral Take 60 mg by mouth daily.    Marland Kitchen FLUTICASONE PROPIONATE  HFA 110 MCG/ACT IN AERO Inhalation Inhale 2 puffs into the lungs 2 (two) times daily.    Marland Kitchen HYDROCHLOROTHIAZIDE 25 MG PO TABS Oral Take 25 mg by mouth daily. For high  blood pressure     . HYDROXYZINE HCL 25 MG PO TABS Oral Take 25 mg by mouth 3 (three) times daily as needed. itching    . ARTIFICIAL TEARS OP Ophthalmic Apply 1 drop to eye 4 (four) times daily. Dextran 70/hypromellose    . MECLIZINE HCL 25 MG PO TABS Oral Take 25 mg by mouth every 8 (eight) hours as needed. For vertigo    . POTASSIUM CHLORIDE CRYS ER 20 MEQ PO TBCR Oral Take 20 mEq by mouth daily.      Marland Kitchen PREDNISONE 20 MG PO TABS Oral Take 2 tablets (40 mg total) by mouth daily. 14 tablet 0  . PRESCRIPTION MEDICATION Topical Apply 1 application topically 2 (two) times daily. Cream compounded at [facility]  riamcinolone 0.1%, silver sulfadiazine 1%   3-1    . TACROLIMUS 0.1 % EX OINT Topical Apply 1 application topically 2 (two) times daily.    . TRIAMCINOLONE ACETONIDE 0.1 % EX CREA Topical Apply 1 application topically 2 (two) times daily.    . WARFARIN SODIUM 5 MG PO TABS Oral Take 5 mg by mouth daily.      BP 117/71  Temp 98.6 F (37 C) (Oral)  Resp 20  SpO2 96%  Physical Exam  Nursing note and vitals reviewed. Constitutional: She is oriented to person, place, and time. She appears well-developed and well-nourished.  Musculoskeletal:       Varicose bleeding right lower extremity lateral aspect.  Neurological: She is alert and oriented to person, place, and time.  Skin: Skin is warm and dry.  Psychiatric: She has a normal mood and affect.    ED Course  Procedures (including critical care time)  Labs Reviewed - No data to display No results found.   No diagnosis found.    MDM  Pressure held for 15 minutes. The areas injected with lidocaine with epi 2 cc. And silver nitrate place. We have had coagulation of this area. A dressing is placed and patient is advised to keep leg elevated.        Hilario Quarry, MD 05/29/12 [phone]

## 2012-05-30 ENCOUNTER — Telehealth: Payer: Self-pay | Admitting: Pharmacist

## 2012-05-30 NOTE — Telephone Encounter (Signed)
Patient called very distressed about her recent visit to the ER for bleeding varicose veins in her legs.  She says that it took 3 hours to stop the bleeding.  (This type of episode has happened before.)  A PT/INR does not appear to have been drawn while in the ER on 05/29/12. She is worried that the Coumadin may be thinning her blood too much and causing her veins to "bust open."  She complains that she feels weak, and that she is not sure if the Coumadin is doing more harm than good.   She has not skipped any doses recently, and her next scheduled PT/INR is Tuesday, 06/03/12 with Coumadin clinic afterward.   I encouraged patient to continue her Coumadin for now, keep these appointments, and also assured her that we would communicate her concerns to Dr. Cyndie Chime.  Pt communicated understanding.

## 2012-05-31 ENCOUNTER — Emergency Department (HOSPITAL_COMMUNITY): Payer: Medicare Other

## 2012-05-31 ENCOUNTER — Encounter (HOSPITAL_COMMUNITY): Payer: Self-pay | Admitting: *Deleted

## 2012-05-31 ENCOUNTER — Observation Stay (HOSPITAL_COMMUNITY)
Admission: EM | Admit: 2012-05-31 | Discharge: 2012-05-31 | Disposition: A | Discharge status: Home / Self Care | Payer: Medicare Other | Attending: Internal Medicine | Admitting: Internal Medicine

## 2012-05-31 DIAGNOSIS — N179 Acute kidney failure, unspecified: Secondary | ICD-10-CM

## 2012-05-31 DIAGNOSIS — F323 Major depressive disorder, single episode, severe with psychotic features: Secondary | ICD-10-CM | POA: Diagnosis present

## 2012-05-31 DIAGNOSIS — Z86711 Personal history of pulmonary embolism: Secondary | ICD-10-CM | POA: Diagnosis present

## 2012-05-31 DIAGNOSIS — I83813 Varicose veins of bilateral lower extremities with pain: Secondary | ICD-10-CM

## 2012-05-31 DIAGNOSIS — I10 Essential (primary) hypertension: Secondary | ICD-10-CM

## 2012-05-31 DIAGNOSIS — E785 Hyperlipidemia, unspecified: Secondary | ICD-10-CM

## 2012-05-31 DIAGNOSIS — F329 Major depressive disorder, single episode, unspecified: Secondary | ICD-10-CM

## 2012-05-31 DIAGNOSIS — Z7901 Long term (current) use of anticoagulants: Secondary | ICD-10-CM

## 2012-05-31 DIAGNOSIS — J45909 Unspecified asthma, uncomplicated: Secondary | ICD-10-CM

## 2012-05-31 DIAGNOSIS — D509 Iron deficiency anemia, unspecified: Secondary | ICD-10-CM

## 2012-05-31 DIAGNOSIS — I2699 Other pulmonary embolism without acute cor pulmonale: Secondary | ICD-10-CM

## 2012-05-31 DIAGNOSIS — I951 Orthostatic hypotension: Principal | ICD-10-CM

## 2012-05-31 DIAGNOSIS — E669 Obesity, unspecified: Secondary | ICD-10-CM

## 2012-05-31 DIAGNOSIS — Z66 Do not resuscitate: Secondary | ICD-10-CM | POA: Insufficient documentation

## 2012-05-31 DIAGNOSIS — K219 Gastro-esophageal reflux disease without esophagitis: Secondary | ICD-10-CM

## 2012-05-31 DIAGNOSIS — F209 Schizophrenia, unspecified: Secondary | ICD-10-CM | POA: Diagnosis present

## 2012-05-31 DIAGNOSIS — H814 Vertigo of central origin: Secondary | ICD-10-CM

## 2012-05-31 DIAGNOSIS — R42 Dizziness and giddiness: Secondary | ICD-10-CM | POA: Diagnosis present

## 2012-05-31 DIAGNOSIS — L408 Other psoriasis: Secondary | ICD-10-CM | POA: Insufficient documentation

## 2012-05-31 DIAGNOSIS — L409 Psoriasis, unspecified: Secondary | ICD-10-CM | POA: Diagnosis present

## 2012-05-31 DIAGNOSIS — F3289 Other specified depressive episodes: Secondary | ICD-10-CM

## 2012-05-31 LAB — URINALYSIS, ROUTINE W REFLEX MICROSCOPIC
Nitrite: NEGATIVE
Protein, ur: NEGATIVE mg/dL
Specific Gravity, Urine: 1.036 — ABNORMAL HIGH (ref 1.005–1.030)
Urobilinogen, UA: 0.2 mg/dL (ref 0.0–1.0)

## 2012-05-31 LAB — CBC
HCT: 32.6 % — ABNORMAL LOW (ref 36.0–46.0)
MCH: 31.7 pg (ref 26.0–34.0)
MCHC: 33.4 g/dL (ref 30.0–36.0)
MCV: 94.8 fL (ref 78.0–100.0)
Platelets: 252 10*3/uL (ref 150–400)
RDW: 14.7 % (ref 11.5–15.5)
WBC: 7.4 10*3/uL (ref 4.0–10.5)

## 2012-05-31 LAB — BASIC METABOLIC PANEL
BUN: 16 mg/dL (ref 6–23)
CO2: 31 mEq/L (ref 19–32)
Calcium: 9.3 mg/dL (ref 8.4–10.5)
Chloride: 97 mEq/L (ref 96–112)
Creatinine, Ser: 0.9 mg/dL (ref 0.50–1.10)

## 2012-05-31 LAB — CBC WITH DIFFERENTIAL/PLATELET
Basophils Relative: 0 % (ref 0–1)
Eosinophils Absolute: 0.2 10*3/uL (ref 0.0–0.7)
MCH: 31.6 pg (ref 26.0–34.0)
MCHC: 33.1 g/dL (ref 30.0–36.0)
Neutro Abs: 4.6 10*3/uL (ref 1.7–7.7)
Neutrophils Relative %: 60 % (ref 43–77)
Platelets: 270 10*3/uL (ref 150–400)
RBC: 3.58 MIL/uL — ABNORMAL LOW (ref 3.87–5.11)

## 2012-05-31 LAB — URINE MICROSCOPIC-ADD ON

## 2012-05-31 LAB — POCT I-STAT, CHEM 8
HCT: 36 % (ref 36.0–46.0)
Hemoglobin: 12.2 g/dL (ref 12.0–15.0)
Sodium: 141 mEq/L (ref 135–145)
TCO2: 32 mmol/L (ref 0–100)

## 2012-05-31 LAB — PROTIME-INR: INR: 2.46 — ABNORMAL HIGH (ref 0.00–1.49)

## 2012-05-31 MED ORDER — HYDROCODONE-ACETAMINOPHEN 5-325 MG PO TABS
1.0000 | ORAL_TABLET | ORAL | Status: DC | PRN
  Administered 2012-05-31: 1 via ORAL
  Filled 2012-05-31: qty 1

## 2012-05-31 MED ORDER — ZOLPIDEM TARTRATE 5 MG PO TABS: 5.0000 mg | ORAL_TABLET | Freq: Every evening | ORAL | Status: DC | PRN

## 2012-05-31 MED ORDER — FLUTICASONE PROPIONATE HFA 110 MCG/ACT IN AERO
2.0000 | INHALATION_SPRAY | Freq: Two times a day (BID) | RESPIRATORY_TRACT | Status: DC
  Administered 2012-05-31: 2 via RESPIRATORY_TRACT
  Filled 2012-05-31: qty 12

## 2012-05-31 MED ORDER — HYDROCODONE-ACETAMINOPHEN 5-325 MG PO TABS
1.0000 | ORAL_TABLET | ORAL | Status: DC | PRN
  Administered 2012-05-31: 2 via ORAL
  Filled 2012-05-31: qty 2

## 2012-05-31 MED ORDER — WARFARIN SODIUM 5 MG PO TABS
5.0000 mg | ORAL_TABLET | Freq: Every day | ORAL | Status: DC
  Filled 2012-05-31: qty 1

## 2012-05-31 MED ORDER — TACROLIMUS 0.1 % EX OINT: TOPICAL_OINTMENT | Freq: Two times a day (BID) | CUTANEOUS | Status: DC

## 2012-05-31 MED ORDER — SODIUM CHLORIDE 0.9 % IJ SOLN: 3.0000 mL | Freq: Two times a day (BID) | INTRAMUSCULAR | Status: DC

## 2012-05-31 MED ORDER — ONDANSETRON HCL 4 MG/2ML IJ SOLN: 4.0000 mg | Freq: Four times a day (QID) | INTRAMUSCULAR | Status: DC | PRN

## 2012-05-31 MED ORDER — MECLIZINE HCL 25 MG PO TABS
25.0000 mg | ORAL_TABLET | Freq: Three times a day (TID) | ORAL | Status: DC | PRN
  Administered 2012-05-31: 25 mg via ORAL
  Filled 2012-05-31: qty 1

## 2012-05-31 MED ORDER — ONDANSETRON HCL 4 MG PO TABS: 4.0000 mg | ORAL_TABLET | Freq: Four times a day (QID) | ORAL | Status: DC | PRN

## 2012-05-31 MED ORDER — CLONAZEPAM 1 MG PO TABS: 1.0000 mg | ORAL_TABLET | Freq: Two times a day (BID) | ORAL | Status: DC | PRN

## 2012-05-31 MED ORDER — POTASSIUM CHLORIDE CRYS ER 20 MEQ PO TBCR
40.0000 meq | EXTENDED_RELEASE_TABLET | Freq: Once | ORAL | Status: AC
  Administered 2012-05-31: 40 meq via ORAL

## 2012-05-31 MED ORDER — POTASSIUM CHLORIDE CRYS ER 20 MEQ PO TBCR
20.0000 meq | EXTENDED_RELEASE_TABLET | Freq: Every day | ORAL | Status: DC
  Administered 2012-05-31: 20 meq via ORAL
  Filled 2012-05-31: qty 2
  Filled 2012-05-31: qty 1

## 2012-05-31 MED ORDER — FERROUS SULFATE 325 (65 FE) MG PO TABS
325.0000 mg | ORAL_TABLET | Freq: Two times a day (BID) | ORAL | Status: DC
  Filled 2012-05-31 (×2): qty 1

## 2012-05-31 MED ORDER — HYDROXYZINE HCL 25 MG PO TABS: 25.0000 mg | ORAL_TABLET | Freq: Three times a day (TID) | ORAL | Status: DC | PRN

## 2012-05-31 MED ORDER — WARFARIN - PHYSICIAN DOSING INPATIENT
Freq: Every day | Status: DC
  Administered 2012-05-31: 18:00:00

## 2012-05-31 MED ORDER — ATENOLOL 100 MG PO TABS
100.0000 mg | ORAL_TABLET | Freq: Every day | ORAL | Status: DC
  Filled 2012-05-31: qty 1

## 2012-05-31 MED ORDER — POLYVINYL ALCOHOL 1.4 % OP SOLN
1.0000 [drp] | Freq: Four times a day (QID) | OPHTHALMIC | Status: DC
  Administered 2012-05-31: 1 [drp] via OPHTHALMIC
  Filled 2012-05-31: qty 15

## 2012-05-31 MED ORDER — SODIUM CHLORIDE 0.9 % IV SOLN
INTRAVENOUS | Status: DC
  Administered 2012-05-31: 13:00:00 via INTRAVENOUS

## 2012-05-31 MED ORDER — ALBUTEROL SULFATE HFA 108 (90 BASE) MCG/ACT IN AERS: 2.0000 | INHALATION_SPRAY | Freq: Two times a day (BID) | RESPIRATORY_TRACT | Status: DC | PRN

## 2012-05-31 MED ORDER — POTASSIUM CHLORIDE CRYS ER 20 MEQ PO TBCR: 20.0000 meq | EXTENDED_RELEASE_TABLET | Freq: Every day | ORAL | Status: DC

## 2012-05-31 MED ORDER — CLOBETASOL PROPIONATE 0.05 % EX OINT
1.0000 "application " | TOPICAL_OINTMENT | Freq: Two times a day (BID) | CUTANEOUS | Status: DC
  Filled 2012-05-31: qty 15

## 2012-05-31 MED ORDER — PANTOPRAZOLE SODIUM 40 MG PO TBEC
80.0000 mg | DELAYED_RELEASE_TABLET | Freq: Every day | ORAL | Status: DC
Start: 2012-05-31 — End: 2012-06-01

## 2012-05-31 MED ORDER — HYDROCODONE-ACETAMINOPHEN 5-325 MG PO TABS: 1.0000 | ORAL_TABLET | ORAL | Status: AC | PRN

## 2012-05-31 MED ORDER — TACROLIMUS 0.1 % EX OINT: 1.0000 "application " | TOPICAL_OINTMENT | Freq: Two times a day (BID) | CUTANEOUS | Status: DC

## 2012-05-31 MED ORDER — FLUOXETINE HCL 20 MG PO CAPS
60.0000 mg | ORAL_CAPSULE | Freq: Every day | ORAL | Status: DC
  Filled 2012-05-31: qty 3

## 2012-05-31 MED ORDER — HYDROCHLOROTHIAZIDE 25 MG PO TABS
25.0000 mg | ORAL_TABLET | Freq: Every day | ORAL | Status: DC
Start: 2012-05-31 — End: 2012-06-01
  Filled 2012-05-31: qty 1

## 2012-05-31 MED ORDER — CAMPHOR-MENTHOL 0.5-0.5 % EX LOTN
1.0000 "application " | TOPICAL_LOTION | Freq: Two times a day (BID) | CUTANEOUS | Status: DC
  Filled 2012-05-31: qty 222

## 2012-05-31 NOTE — Progress Notes (Signed)
Patient presents today with complaints of lightheadedness and vertigo, was in ED yesterday is due to varicose vein bleed, status post cauterization, CT brain did not show any acute finding, ED. we are concerned about central vertigo and requested admission for further evaluation, as well patient seems to developed acute renal failure where her creatinine went from 0.87 to 1.6 .

## 2012-05-31 NOTE — ED Notes (Addendum)
C/o dizziness. Relates sx to vertigo, h/o vertigo. Sitting on edge of bed. Speaking with pharm tech.

## 2012-05-31 NOTE — ED Notes (Signed)
Patient reports increased dizziness when changing from a lying to a sitting position but that it does not increase when going from a sitting to a standing position.

## 2012-05-31 NOTE — ED Notes (Signed)
I put pt on monitor. 4:27am JG

## 2012-05-31 NOTE — H&P (Signed)
[facility] History and Physical  [name] [name] NWG:[id number] DOB: 27-Oct-1940 DOA: 05/31/2012  Referring physician: April Palombo, ER physician PCP: Patient has no primary care physician. She used to follow at the [facility] medical clinic and has plans to establish with a new primary care physician but her appointment is not until December.  Chief Complaint: Dizziness  HPI: [name] [name] is a 71 y.o. female with past medical history of hypertension, coronary embolism on chronic anticoagulation and psoriasis who underwent varicose vein cauterization earlier in the week and since that time started having some dizziness. She has a history of vertigo but stated that this time around, her dizziness was much much worse with both room spinning sensation and feeling lightheaded like she was going to pass out. In the emergency room, a CT scan of the head was unremarkable. It was noted that her creatinine was elevated at 1.6 with a baseline normal of 0.9 a few days prior. Hospitalists were called for further evaluation.  Review of Systems: Patient was sent to the floor with holding orders. When I saw her in the floor, she was doing okay. She denied any headache but every time she tried to sit up for both lightheaded and felt the room was spinning. She denied any vision changes, dysphasia, chest pain, palpitations, shortness of breath, wheeze, cough, abdominal pain, hematuria, dysuria, constipation, diarrhea, focal steady numbness or weakness or pain. Review of systems is otherwise negative.  Past Medical History  Diagnosis Date  . Depression   . Schizophrenia   . Arthritis   . Hyperlipidemia   . Hypertension   . Chronic pain   . 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 DVT (deep venous thrombosis)     per 01/17/10 d/c summary- "remote hx of dvt"?  . Iron deficiency anemia   . Chronic headaches   . Sleep apnea   . Glaucoma   . Hemorrhoids   . Asthma   . Anxiety   .  GERD (gastroesophageal reflux disease)   . Psoriasis   . Psoriasis 04/29/2012    New onset evaluated by Dr Marylou Flesher, Dermatology, [facility] 6/13  Rx 0.1% Tacrolimus ointment   Past Surgical History  Procedure Date  . Spinal fusion     2004  . Joint replacement right knee 11/2008  . Tubal ligation year 8  . Carpal tunnel release     bilateral    Social History:  reports that she has never smoked. She has never used smokeless tobacco. She reports that she does not drink alcohol or use illicit drugs. she lives at home with her husband. She can normally participate in most activities of daily living   Allergies  Allergen Reactions  . Codeine Itching  . Tramadol Itching  . Adhesive (Tape) Rash and Other (See Comments)    Pulls skin off    Family History  Problem Relation Age of Onset  . Diabetes Sister   . Colon cancer Brother 40   Prior to Admission medications   Medication Sig Start Date End Date Taking? Authorizing Provider  albuterol (VENTOLIN HFA) 108 (90 BASE) MCG/ACT inhaler Inhale 2 puffs into the lungs 2 (two) times daily as needed. For shortness of breath and wheezing   Yes Historical Provider, MD  atenolol (TENORMIN) 100 MG tablet Take 100 mg by mouth daily.   Yes Historical Provider, MD  camphor-menthol Wynelle Fanny) lotion Apply 1 application topically 2 (two) times daily.   Yes  Historical Provider, MD  clobetasol ointment (TEMOVATE) 0.05 % Apply 1 application topically 2 (two) times daily.   Yes Historical Provider, MD  clonazePAM (KLONOPIN) 0.5 MG tablet Take 1 mg by mouth 2 (two) times daily as needed. For anxiety   Yes Historical Provider, MD  esomeprazole (NEXIUM) 40 MG capsule Take 40 mg by mouth daily.  04/07/12  Yes Adlih Moreno-Coll, MD  ferrous sulfate 325 (65 FE) MG tablet Take 325 mg by mouth 2 (two) times daily with a meal.   Yes Historical Provider, MD  FLUoxetine (PROZAC) 20 MG capsule Take 60 mg by mouth daily.   Yes Historical Provider, MD  fluticasone  (FLOVENT HFA) 110 MCG/ACT inhaler Inhale 2 puffs into the lungs 2 (two) times daily.   Yes Historical Provider, MD  hydrochlorothiazide 25 MG tablet Take 25 mg by mouth daily. For high blood pressure    Yes Historical Provider, MD  hydrOXYzine (ATARAX/VISTARIL) 25 MG tablet Take 25 mg by mouth 3 (three) times daily as needed. itching   Yes Historical Provider, MD  Hypromellose (ARTIFICIAL TEARS OP) Apply 1 drop to eye 4 (four) times daily. Dextran 70/hypromellose   Yes Historical Provider, MD  meclizine (ANTIVERT) 25 MG tablet Take 25 mg by mouth every 8 (eight) hours as needed. For vertigo   Yes Historical Provider, MD  potassium chloride SA (K-DUR,KLOR-CON) 20 MEQ tablet Take 20 mEq by mouth daily.     Yes Historical Provider, MD  PRESCRIPTION MEDICATION Apply 1 application topically 2 (two) times daily. Cream compounded at [facility]  riamcinolone 0.1%, silver sulfadiazine 1%   3-1   Yes Historical Provider, MD  tacrolimus (PROTOPIC) 0.1 % ointment Apply 1 application topically 2 (two) times daily. 04/12/12 04/12/13 Yes Remi Haggard, NP  triamcinolone cream (KENALOG) 0.1 % Apply 1 application topically 2 (two) times daily. 04/12/12 04/12/13 Yes Remi Haggard, NP  warfarin (COUMADIN) 5 MG tablet Take 5 mg by mouth daily.   Yes Historical Provider, MD   Physical Exam: Filed Vitals:   05/31/12 0629 05/31/12 0800 05/31/12 0806 05/31/12 1300  BP: 140/84 117/69    Pulse: 64 58    Temp:   97.9 F (36.6 C)   TempSrc:   Oral   Resp:  13    Height:      Weight:      SpO2:  96%  96%     General:  Alert and oriented x3, no acute distress, fatigued, looks older than stated age  Eyes: Extraocular movements are intact, sclera nonicteric  ENT: Mucous membranes are dry, normocephalic, atraumatic  Neck: Supple, no JVD  Cardiovascular: Regular rate and rhythm, S1-S2  Respiratory: Clear to auscultation bilaterally  Abdomen: Soft, nontender, nondistended, positive bowel sounds  Skin: She has some  signs of chronic psoriasis on her elbows and lower extremities  Musculoskeletal: No clubbing or cyanosis, trace pitting edema  Psychiatric: Patient is a history of schizophrenia, but is alert and appropriate for me. No evidence of acute psychoses  Neurologic: No focal deficits , cranial nerves II through XII are intact, upper and lower extremities are symmetric with no focal weakness  Labs on Admission:  Basic Metabolic Panel:  Lab 05/31/12 1610  NA 141  K 3.9  CL 98  CO2 --  GLUCOSE 129*  BUN 24*  CREATININE 1.60*  CALCIUM --  MG --  PHOS --   CBC:  Lab 05/31/12 0051 05/31/12 0018  WBC -- 7.7  NEUTROABS -- 4.6  HGB 12.2 11.3*  HCT 36.0  34.1*  MCV -- 95.3  PLT -- 270    Radiological Exams on Admission: Dg Chest 2 View 05/31/2012   IMPRESSION: No evidence of active pulmonary disease.   Original Report Authenticated By: Marlon Pel, M.D.    Ct Head Wo Contrast 05/31/2012  IMPRESSION: No acute intracranial abnormalities.   Original Report Authenticated By: Marlon Pel, M.D.     EKG: Independently reviewed. Normal sinus rhythm with LVH  Assessment/Plan Principal Problem:  *Orthostatic hypotension: She has some very mild acute renal failure. We'll hydrate and in the meantime hold her HCTZ. Recheck blood work this afternoon.  Active Problems:  HYPERLIPIDEMIA  SCHIZOPHRENIA: Stable.   DEPRESSION: Continue Prozac.   Essential hypertension, benign: Continue antihypertensives.   PULMONARY EMBOLISM  Chronic anticoagulation: INR is therapeutic. Continue Coumadin.   Psoriasis: Continue cream.   Vertigo: She has episodes of acute vertigo which is likely compounded by orthostatic hypotension. When necessary Antivert.   ARF (acute renal failure): See above.    Code Status: DO NOT RESUSCITATE Family Communication: Have left message with husband Disposition Plan: Plan is to hydrate this patient and recheck lab work and symptoms later today. If all is  improved, plan to discharge home later today  Time spent: 40 minutes  Hollice Espy [facility] Pager [phone]  If 7PM-7AM, please contact night-coverage [email or web address] Password [email or web address] 05/31/2012, 2:51 PM

## 2012-05-31 NOTE — ED Notes (Signed)
Back from CT

## 2012-05-31 NOTE — ED Notes (Signed)
Pt states she was tx here Thurs for a varicose vein that ruptured.  She states she lost a lot of blood and by the time she went home she was feeling dizzy.  She thinks she should have been admitted d/t all the blood she lost.  Pt ao x 4.  No bleeding from site at this time.  Dressing clean, dry and intact.  Pt slightly hypotensive but nsr.

## 2012-05-31 NOTE — ED Provider Notes (Signed)
History     CSN: [id number]  Arrival date & time 05/31/12  0002   First MD Initiated Contact with Patient 05/31/12 [phone]      Chief Complaint  Patient presents with  . Dizziness    (Consider location/radiation/quality/duration/timing/severity/associated sxs/prior treatment) The history is provided by the patient. No language interpreter was used.  Patient had a varicose vein cauterized earlier in the week and attributes her dizziness to that.  She has lightheadedness feel off when she walks and also has a spinning sensation.  Not sudden onset not intense.  No speech difficulty.  No tinnitus.  No CP. No SOB, no n/v/d.  No f/c/r.    Past Medical History  Diagnosis Date  . Depression   . Schizophrenia   . Arthritis   . Hyperlipidemia   . Hypertension   . Chronic pain   . 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 DVT (deep venous thrombosis)     per 01/17/10 d/c summary- "remote hx of dvt"?  . Iron deficiency anemia   . Chronic headaches   . Sleep apnea   . Glaucoma   . Hemorrhoids   . Asthma   . Anxiety   . GERD (gastroesophageal reflux disease)   . Psoriasis   . Psoriasis 04/29/2012    New onset evaluated by Dr Marylou Flesher, Dermatology, [facility] 6/13  Rx 0.1% Tacrolimus ointment    Past Surgical History  Procedure Date  . Spinal fusion     2004  . Joint replacement right knee 11/2008  . Tubal ligation year 23  . Carpal tunnel release     bilateral     Family History  Problem Relation Age of Onset  . Diabetes Sister   . Colon cancer Brother 40    History  Substance Use Topics  . Smoking status: Never Smoker   . Smokeless tobacco: Never Used  . Alcohol Use: No    OB History    Grav Para Term Preterm Abortions TAB SAB Ect Mult Living                  Review of Systems  HENT: Negative for neck pain and tinnitus.   Eyes: Negative for photophobia.  Respiratory: Negative for shortness of breath.   Cardiovascular: Negative for  chest pain and palpitations.  Neurological: Positive for dizziness and light-headedness.  All other systems reviewed and are negative.    Allergies  Codeine; Tramadol; and Adhesive  Home Medications   Current Outpatient Rx  Name Route Sig Dispense Refill  . ALBUTEROL SULFATE HFA 108 (90 BASE) MCG/ACT IN AERS Inhalation Inhale 2 puffs into the lungs 2 (two) times daily as needed. For shortness of breath and wheezing    . ATENOLOL 100 MG PO TABS Oral Take 100 mg by mouth daily.    Marland Kitchen CAMPHOR-MENTHOL 0.5-0.5 % EX LOTN Topical Apply 1 application topically 2 (two) times daily.    Marland Kitchen CLOBETASOL PROPIONATE 0.05 % EX OINT Topical Apply 1 application topically 2 (two) times daily.    Marland Kitchen CLONAZEPAM 0.5 MG PO TABS Oral Take 1 mg by mouth 2 (two) times daily as needed. For anxiety    . ESOMEPRAZOLE MAGNESIUM 40 MG PO CPDR Oral Take 40 mg by mouth daily.     Marland Kitchen FERROUS SULFATE 325 (65 FE) MG PO TABS Oral Take 325 mg by mouth 2 (two) times daily with a meal.    . FLUOXETINE HCL 20 MG PO CAPS  Oral Take 60 mg by mouth daily.    Marland Kitchen FLUTICASONE PROPIONATE  HFA 110 MCG/ACT IN AERO Inhalation Inhale 2 puffs into the lungs 2 (two) times daily.    Marland Kitchen HYDROCHLOROTHIAZIDE 25 MG PO TABS Oral Take 25 mg by mouth daily. For high blood pressure     . HYDROXYZINE HCL 25 MG PO TABS Oral Take 25 mg by mouth 3 (three) times daily as needed. itching    . ARTIFICIAL TEARS OP Ophthalmic Apply 1 drop to eye 4 (four) times daily. Dextran 70/hypromellose    . MECLIZINE HCL 25 MG PO TABS Oral Take 25 mg by mouth every 8 (eight) hours as needed. For vertigo    . POTASSIUM CHLORIDE CRYS ER 20 MEQ PO TBCR Oral Take 20 mEq by mouth daily.      Marland Kitchen PRESCRIPTION MEDICATION Topical Apply 1 application topically 2 (two) times daily. Cream compounded at [facility]  riamcinolone 0.1%, silver sulfadiazine 1%   3-1    . TACROLIMUS 0.1 % EX OINT Topical Apply 1 application topically 2 (two) times daily.    . TRIAMCINOLONE ACETONIDE 0.1 % EX CREA  Topical Apply 1 application topically 2 (two) times daily.    . WARFARIN SODIUM 5 MG PO TABS Oral Take 5 mg by mouth daily.      BP 125/70  Pulse 68  Temp 98.1 F (36.7 C) (Oral)  Resp 18  Ht 5\' 5"  (1.651 m)  Wt 234 lb (106.142 kg)  BMI 38.94 kg/m2  SpO2 100%  Physical Exam  Constitutional: She is oriented to person, place, and time. She appears well-developed and well-nourished. No distress.  HENT:  Head: Normocephalic and atraumatic.  Mouth/Throat: Oropharynx is clear and moist.  Eyes: Conjunctivae and EOM are normal. Pupils are equal, round, and reactive to light.  Neck: Normal range of motion. Neck supple.  Cardiovascular: Normal rate and regular rhythm.   Pulmonary/Chest: Effort normal and breath sounds normal. She has no wheezes. She has no rales.  Abdominal: Soft. Bowel sounds are normal.  Musculoskeletal: Normal range of motion.  Neurological: She is alert and oriented to person, place, and time. She has normal reflexes. She displays normal reflexes. No cranial nerve deficit.  Skin: Skin is warm and dry.  Psychiatric: She has a normal mood and affect.    ED Course  Procedures (including critical care time)  Labs Reviewed  CBC WITH DIFFERENTIAL - Abnormal; Notable for the following:    RBC 3.58 (*)     Hemoglobin 11.3 (*)     HCT 34.1 (*)     All other components within normal limits  URINALYSIS, ROUTINE W REFLEX MICROSCOPIC - Abnormal; Notable for the following:    Color, Urine AMBER (*)  BIOCHEMICALS MAY BE AFFECTED BY COLOR   APPearance CLOUDY (*)     Specific Gravity, Urine 1.036 (*)     Bilirubin Urine SMALL (*)     Ketones, ur 15 (*)     Leukocytes, UA SMALL (*)     All other components within normal limits  POCT I-STAT, CHEM 8 - Abnormal; Notable for the following:    BUN 24 (*)     Creatinine, Ser 1.60 (*)     Glucose, Bld 129 (*)     All other components within normal limits  URINE MICROSCOPIC-ADD ON - Abnormal; Notable for the following:     Squamous Epithelial / LPF FEW (*)     Bacteria, UA FEW (*)     Casts GRANULAR CAST (*)  HYALINE CASTS   All other components within normal limits  PROTIME-INR - Abnormal; Notable for the following:    Prothrombin Time 27.1 (*)     INR 2.46 (*)     All other components within normal limits  POCT I-STAT TROPONIN I   Dg Chest 2 View  05/31/2012  *RADIOLOGY REPORT*  Clinical Data: Dizziness and hypotension.  CHEST - 2 VIEW  Comparison: 04/22/2011  Findings: Slightly shallow inspiration.  Fibrosis in the lung bases. The heart size and pulmonary vascularity are normal. The lungs appear clear and expanded without focal air space disease or consolidation. No blunting of the costophrenic angles.  No pneumothorax.  Mediastinal contours appear intact.  Tortuous aorta. Postoperative changes in the lumbar spine.  Degenerative changes in the thoracic spine.  No significant change since previous study.  IMPRESSION: No evidence of active pulmonary disease.   Original Report Authenticated By: Marlon Pel, M.D.    Ct Head Wo Contrast  05/31/2012  *RADIOLOGY REPORT*  Clinical Data: Dizziness post varicose vein rupture.  On Coumadin.  CT HEAD WITHOUT CONTRAST  Technique:  Contiguous axial images were obtained from the base of the skull through the vertex without contrast.  Comparison: 02/22/2011  Findings: Mild diffuse cerebral atrophy. The ventricles and sulci are otherwise symmetrical without significant effacement, displacement, or dilatation. No mass effect or midline shift. No abnormal extra-axial fluid collections. The grey-white matter junction is distinct. Basal cisterns are not effaced. No acute intracranial hemorrhage. No depressed skull fractures.  Vascular calcifications.  Visualized paranasal sinuses are not opacified. No significant change since previous study.  IMPRESSION: No acute intracranial abnormalities.   Original Report Authenticated By: Marlon Pel, M.D.      No diagnosis  found.    MDM   Date: 05/31/2012  Rate: 62  Rhythm: normal sinus rhythm  QRS Axis: normal  Intervals: normal  ST/T Wave abnormalities: normal  Conduction Disutrbances:none  Narrative Interpretation:   Old EKG Reviewed: unchanged        Will admit for central vertigo  Raed Schalk K Correne Lalani-Rasch, MD 05/31/12 [phone]

## 2012-05-31 NOTE — Discharge Summary (Signed)
Physician Discharge Summary  [name] [name] ZOX:[id number] DOB: Feb 18, 1941 DOA: 05/31/2012  PCP: No primary provider on file.  Admit date: 05/31/2012 Discharge date: 05/31/2012  Recommendations for Outpatient Follow-up:  1. Patient is waiting for establishment of a primary Care physician at [facility] in December.  Discharge Diagnoses:  Principal Problem:  *Orthostatic hypotension Active Problems:  HYPERLIPIDEMIA  SCHIZOPHRENIA  DEPRESSION  Essential hypertension, benign  PULMONARY EMBOLISM  Chronic anticoagulation  Psoriasis  Vertigo  ARF (acute renal failure)   Discharge Condition: Improved, being discharged home  Diet recommendation: Low-sodium  Filed Weights   05/31/12 0010  Weight: 106.142 kg (234 lb)    History of present illness:  [name] [name] is a 71 y.o. female with past medical history of hypertension, coronary embolism on chronic anticoagulation and psoriasis who underwent varicose vein cauterization earlier in the week and since that time started having some dizziness. She has a history of vertigo but stated that this time around, her dizziness was much much worse with both room spinning sensation and feeling lightheaded like she was going to pass out. In the emergency room, a CT scan of the head was unremarkable. It was noted that her creatinine was elevated at 1.6 with a baseline normal of 0.9 a few days prior. Hospitalists were called for further evaluation.   Hospital Course:  Principal Problem:  *Orthostatic hypotension: She has some very mild acute renal failure. We'll hydrate and in the meantime hold her HCTZ. Repeat blood work in the afternoon noted creatinine back down to 0.9. He then ambulated the patient and she was able to and awake without any vertigo or lightheadedness.   Active Problems:  HYPERLIPIDEMIA : Stable.  SCHIZOPHRENIA: Stable.   DEPRESSION: Continue Prozac.   Essential hypertension, benign: Continue antihypertensives.   PULMONARY  EMBOLISM  Chronic anticoagulation: INR is therapeutic. Continue Coumadin.   Psoriasis: Continue cream.   Vertigo: Patient receives a dose of by mouth Antivert this morning. This seems to have improved her symptoms.  ARF (acute renal failure): See above.  Hypokalemia: With normal saline, her potassium required replacing in the afternoon.   Procedures:  None  Consultations:  None  Discharge Exam: Filed Vitals:   05/31/12 0629 05/31/12 0800 05/31/12 0806 05/31/12 1300  BP: 140/84 117/69  120/72  Pulse: 64 58  62  Temp:   97.9 F (36.6 C) 98 F (36.7 C)  TempSrc:   Oral   Resp:  13  16  Height:      Weight:      SpO2:  96%  96%    General: Alert and oriented x3, no acute distress, vertigo and lightheadedness has resolved Cardiovascular: Regular rate and rhythm, S1-S2 Respiratory: Clear to auscultation bilaterally  Discharge Instructions  Discharge Orders    Future Appointments: Provider: Department: Dept Phone: Center:   06/02/2012 11:15 AM Baker Pierini, FNP [facility] [phone] [facility]   06/03/2012 2:00 PM Krista Blue [facility] [phone] None   06/03/2012 2:15 PM [facility] [phone] None   05/05/2013 3:00 PM Beverely Pace Shumate [facility] [phone] None   05/05/2013 3:30 PM Levert Feinstein, MD [facility] [phone] None     Future Orders Please Complete By Expires   Diet - low sodium heart healthy      Increase activity slowly        Medication List  As of 05/31/2012  6:57 PM   TAKE these medications         ARTIFICIAL  TEARS OP   Apply 1 drop to eye 4 (four) times daily. Dextran 70/hypromellose      atenolol 100 MG tablet   Commonly known as: TENORMIN   Take 100 mg by mouth daily.      camphor-menthol lotion   Commonly known as: SARNA   Apply 1 application topically 2 (two) times daily.      clobetasol ointment 0.05 %   Commonly known as: TEMOVATE   Apply 1 application  topically 2 (two) times daily.      clonazePAM 0.5 MG tablet   Commonly known as: KLONOPIN   Take 1 mg by mouth 2 (two) times daily as needed. For anxiety      esomeprazole 40 MG capsule   Commonly known as: NEXIUM   Take 40 mg by mouth daily.      ferrous sulfate 325 (65 FE) MG tablet   Take 325 mg by mouth 2 (two) times daily with a meal.      FLUoxetine 20 MG capsule   Commonly known as: PROZAC   Take 60 mg by mouth daily.      fluticasone 110 MCG/ACT inhaler   Commonly known as: FLOVENT HFA   Inhale 2 puffs into the lungs 2 (two) times daily.      hydrochlorothiazide 25 MG tablet   Commonly known as: HYDRODIURIL   Take 25 mg by mouth daily. For high blood pressure      HYDROcodone-acetaminophen 5-325 MG per tablet   Commonly known as: NORCO/VICODIN   Take 1-2 tablets by mouth every 4 (four) hours as needed.      hydrOXYzine 25 MG tablet   Commonly known as: ATARAX/VISTARIL   Take 25 mg by mouth 3 (three) times daily as needed. itching      meclizine 25 MG tablet   Commonly known as: ANTIVERT   Take 25 mg by mouth every 8 (eight) hours as needed. For vertigo      potassium chloride SA 20 MEQ tablet   Commonly known as: K-DUR,KLOR-CON   Take 1 tablet (20 mEq total) by mouth daily.      PRESCRIPTION MEDICATION   Apply 1 application topically 2 (two) times daily. Cream compounded at [facility]   riamcinolone 0.1%, silver sulfadiazine 1%   3-1      tacrolimus 0.1 % ointment   Commonly known as: PROTOPIC   Apply 1 application topically 2 (two) times daily.      triamcinolone cream 0.1 %   Commonly known as: KENALOG   Apply 1 application topically 2 (two) times daily.      VENTOLIN HFA 108 (90 BASE) MCG/ACT inhaler   Generic drug: albuterol   Inhale 2 puffs into the lungs 2 (two) times daily as needed. For shortness of breath and wheezing      warfarin 5 MG tablet   Commonly known as: COUMADIN   Take 5 mg by mouth daily.      zolpidem 5 MG tablet   Commonly  known as: AMBIEN   Take 1 tablet (5 mg total) by mouth at bedtime as needed for sleep.              The results of significant diagnostics from this hospitalization (including imaging, microbiology, ancillary and laboratory) are listed below for reference.    Significant Diagnostic Studies: Dg Chest 2 View  05/31/2012   IMPRESSION: No evidence of active pulmonary disease.   Original Report Authenticated By: Marlon Pel, M.D.    Ct  Head Wo Contrast  05/31/2012  .  IMPRESSION: No acute intracranial abnormalities.   Original Report Authenticated By: Marlon Pel, M.D.     Microbiology: No results found for this or any previous visit (from the past 240 hour(s)).   Labs: Basic Metabolic Panel:  Lab 05/31/12 1610 05/31/12 0051  NA 136 141  K 3.2* 3.9  CL 97 98  CO2 31 --  GLUCOSE 170* 129*  BUN 16 24*  CREATININE 0.90 1.60*  CALCIUM 9.3 --  MG -- --  PHOS -- --   CBC:  Lab 05/31/12 1513 05/31/12 0051 05/31/12 0018  WBC 7.4 -- 7.7  NEUTROABS -- -- 4.6  HGB 10.9* 12.2 11.3*  HCT 32.6* 36.0 34.1*  MCV 94.8 -- 95.3  PLT 252 -- 270    Time coordinating discharge: 25 minutes (please note patient was admitted and discharged on the same day and this 25 minutes as solely based on discharge planning and decision making)  Signed:  Hollice Espy  [facility] 05/31/2012, 6:57 PM

## 2012-06-02 ENCOUNTER — Ambulatory Visit: Payer: Medicare Other | Admitting: Family

## 2012-06-02 DIAGNOSIS — Z0289 Encounter for other administrative examinations: Secondary | ICD-10-CM

## 2012-06-03 ENCOUNTER — Ambulatory Visit: Payer: Self-pay | Admitting: Pharmacist

## 2012-06-03 ENCOUNTER — Ambulatory Visit: Payer: Medicare Other

## 2012-06-03 ENCOUNTER — Other Ambulatory Visit: Payer: Medicare Other | Admitting: Lab

## 2012-06-03 NOTE — Progress Notes (Signed)
Lengthy phone conversation wtih patient regarding coumadin.  She is very disgruntled with health care and has rec'd advice from [place] that coumadin is the root of her problems and she has decided to take one day at a time. I encouraged her to take one day at a time but to also continue her coumadin daily.  She was therapeutic at 2.5 when she went to the ER this past weekend.  I asked her to come back and see [place] in one month and she said "coumadin was nothing but a money maker" and she was not sure if she would continue taking it.  She stated a few times she wants her health back the way it was when she lived in [place].  Again, "you people in the [place] are all in it to make a dollar."  She made quite a few references to advice from her Reverend.  She would not make and appmt and said she would not commit to remaining on her coumadin and she wished me a good day.

## 2012-06-06 ENCOUNTER — Encounter (HOSPITAL_COMMUNITY): Payer: Self-pay | Admitting: Emergency Medicine

## 2012-06-06 ENCOUNTER — Emergency Department (HOSPITAL_COMMUNITY): Payer: Medicare Other

## 2012-06-06 ENCOUNTER — Emergency Department (HOSPITAL_COMMUNITY)
Admission: EM | Admit: 2012-06-06 | Discharge: 2012-06-06 | Disposition: A | Discharge status: Home / Self Care | Payer: Medicare Other | Attending: Emergency Medicine | Admitting: Emergency Medicine

## 2012-06-06 DIAGNOSIS — I83899 Varicose veins of unspecified lower extremities with other complications: Secondary | ICD-10-CM

## 2012-06-06 DIAGNOSIS — F3289 Other specified depressive episodes: Secondary | ICD-10-CM | POA: Insufficient documentation

## 2012-06-06 DIAGNOSIS — K219 Gastro-esophageal reflux disease without esophagitis: Secondary | ICD-10-CM | POA: Insufficient documentation

## 2012-06-06 DIAGNOSIS — F329 Major depressive disorder, single episode, unspecified: Secondary | ICD-10-CM | POA: Insufficient documentation

## 2012-06-06 DIAGNOSIS — Z7901 Long term (current) use of anticoagulants: Secondary | ICD-10-CM | POA: Insufficient documentation

## 2012-06-06 DIAGNOSIS — Z79899 Other long term (current) drug therapy: Secondary | ICD-10-CM | POA: Insufficient documentation

## 2012-06-06 DIAGNOSIS — I83893 Varicose veins of bilateral lower extremities with other complications: Secondary | ICD-10-CM | POA: Insufficient documentation

## 2012-06-06 DIAGNOSIS — G8929 Other chronic pain: Secondary | ICD-10-CM | POA: Insufficient documentation

## 2012-06-06 DIAGNOSIS — R42 Dizziness and giddiness: Secondary | ICD-10-CM | POA: Insufficient documentation

## 2012-06-06 DIAGNOSIS — Z8739 Personal history of other diseases of the musculoskeletal system and connective tissue: Secondary | ICD-10-CM | POA: Insufficient documentation

## 2012-06-06 DIAGNOSIS — Z8659 Personal history of other mental and behavioral disorders: Secondary | ICD-10-CM | POA: Insufficient documentation

## 2012-06-06 DIAGNOSIS — J45909 Unspecified asthma, uncomplicated: Secondary | ICD-10-CM | POA: Insufficient documentation

## 2012-06-06 LAB — CBC WITH DIFFERENTIAL/PLATELET
HCT: 33.7 % — ABNORMAL LOW (ref 36.0–46.0)
Hemoglobin: 11.7 g/dL — ABNORMAL LOW (ref 12.0–15.0)
Lymphs Abs: 1.5 10*3/uL (ref 0.7–4.0)
MCH: 32.1 pg (ref 26.0–34.0)
Monocytes Relative: 6 % (ref 3–12)
Neutro Abs: 6.1 10*3/uL (ref 1.7–7.7)
Neutrophils Relative %: 75 % (ref 43–77)
RBC: 3.64 MIL/uL — ABNORMAL LOW (ref 3.87–5.11)

## 2012-06-06 LAB — BASIC METABOLIC PANEL
Chloride: 102 mEq/L (ref 96–112)
GFR calc Af Amer: 64 mL/min — ABNORMAL LOW (ref >90)
Potassium: 3.5 mEq/L (ref 3.5–5.1)
Sodium: 141 mEq/L (ref 135–145)

## 2012-06-06 LAB — MAGNESIUM: Magnesium: 1.9 mg/dL (ref 1.5–2.5)

## 2012-06-06 LAB — PROTIME-INR
INR: 1.83 — ABNORMAL HIGH (ref 0.00–1.49)
Prothrombin Time: 21.5 seconds — ABNORMAL HIGH (ref 11.6–15.2)

## 2012-06-06 MED ORDER — METOCLOPRAMIDE HCL 5 MG/ML IJ SOLN
10.0000 mg | Freq: Once | INTRAMUSCULAR | Status: AC
  Administered 2012-06-06: 10 mg via INTRAVENOUS
  Filled 2012-06-06: qty 2

## 2012-06-06 MED ORDER — KETOROLAC TROMETHAMINE 30 MG/ML IJ SOLN
30.0000 mg | Freq: Once | INTRAMUSCULAR | Status: AC
  Administered 2012-06-06: 30 mg via INTRAVENOUS
  Filled 2012-06-06: qty 1

## 2012-06-06 NOTE — ED Notes (Addendum)
PT. BECAME COMBATIVE WITH SECURITY STAFF , ATTEMPTED TO SWING CALL LIGHT TO SECURITY , COMMUNICATING THREATS TO NURSES AND SECURITY STAFF,  EDP SPOKE WITH PT. ON PLAN OF CARE , PT. AMBUATORY - [employer]/SECURITY ESCORTED PT. TO WAITING ROOM . TRANSPORTATION ARRANGED BY CHARGE NURSE.

## 2012-06-06 NOTE — ED Notes (Signed)
PT. REMOVED BLOOD PRESSURE CUFF AT HER LEFT ARM , STATES " I DON'T NEED THAT , YOU WILL BE CHARGING ME ON THAT AND I 'M TIRED THAT SQUEEZING MY ARM" , NURSE EXPLAINED IMPORTANCE TO CHECK HER BLOOD PRESSURE , NURSE ASSISTED PT. BACK TO BED ,  BOTH BEDRAILS UP.

## 2012-06-06 NOTE — ED Notes (Addendum)
Pt ambulated to nursing desk with no difficulty; pt upset d/t no one came to room within 5 minutes of her hitting bell; apologized to pt, explained to pt that i was in another room; pt reports the only need she had was for EDP to come see here again; will notified EDP; when in room, pt making threats, security called

## 2012-06-06 NOTE — ED Notes (Signed)
Pt back in bed rails up x2

## 2012-06-06 NOTE — ED Notes (Signed)
Per EMS, having dizziness/vertigo intermittently for one month, worse in last week, taking meds for it but with no relief; vss --130/102, 90, 18; denies n/v; c/o headache to EMS

## 2012-06-06 NOTE — ED Notes (Signed)
PT. REFUSED TO SIGN DISCHARGE PAPERS , DISCHARGE PAPERS EXPLAINED TO PT.

## 2012-06-06 NOTE — ED Notes (Signed)
NURSE EXPLAINED DELAY AND PROCESS , PT. STATES THE MEDICATIONS DID NOT HELP , NURSE EXPLAINED THAT MEDICATIONS WILL TAKE EFFECT IN APPROX. 30 MINS - TO 45 MINS. PT. VERBALIZED DISCONTENT ON SERVICE AND LONG WAIT , STATES SHE FELT LIKE " THERE IS NO HELP HERE ".

## 2012-06-06 NOTE — ED Provider Notes (Addendum)
History     CSN: [id number]  Arrival date & time 06/06/12  1512   First MD Initiated Contact with Patient 06/06/12 1524      Chief Complaint  Patient presents with  . Dizziness    (Consider location/radiation/quality/duration/timing/severity/associated sxs/prior treatment) HPI Comments:  71 y.o. female with past medical history of hypertension, coronary embolism on chronic anticoagulation and psoriasis who underwent varicose vein cauterization earlier in the week and since that time started having some dizziness. She has a history of vertigo but stated that this time around, her dizziness was much much worse with both room spinning sensation and feeling lightheaded like she was going to pass out. Pt was seen in the ED recently, seems like she got CT, basic workup and was admitted shortly by hospitalist team or discharged from the ED after obs by them. Pt is taking meclizine, with minimal relief, and has been taking it 2-3 times a day. Her vertigo never completely resolves. She has bilateral ear ringing as well, no pain or drainage. Pt has felt unsteady due to this vertigo.  The history is provided by the patient.    Past Medical History  Diagnosis Date  . Depression   . Schizophrenia   . Arthritis   . Hyperlipidemia   . Hypertension   . Chronic pain   . 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 DVT (deep venous thrombosis)     per 01/17/10 d/c summary- "remote hx of dvt"?  . Iron deficiency anemia   . Chronic headaches   . Sleep apnea   . Glaucoma   . Hemorrhoids   . Asthma   . Anxiety   . GERD (gastroesophageal reflux disease)   . Psoriasis   . Psoriasis 04/29/2012    New onset evaluated by Dr Marylou Flesher, Dermatology, [facility] 6/13  Rx 0.1% Tacrolimus ointment    Past Surgical History  Procedure Date  . Spinal fusion     2004  . Joint replacement right knee 11/2008  . Tubal ligation year 60  . Carpal tunnel release     bilateral      Family History  Problem Relation Age of Onset  . Diabetes Sister   . Colon cancer Brother 40    History  Substance Use Topics  . Smoking status: Never Smoker   . Smokeless tobacco: Never Used  . Alcohol Use: No    OB History    Grav Para Term Preterm Abortions TAB SAB Ect Mult Living                  Review of Systems  Constitutional: Negative for activity change.  HENT: Negative for facial swelling and neck pain.   Eyes: Negative for discharge and visual disturbance.  Respiratory: Negative for cough, shortness of breath and wheezing.   Cardiovascular: Negative for chest pain.  Gastrointestinal: Negative for nausea, vomiting, abdominal pain, diarrhea, constipation, blood in stool and abdominal distention.  Genitourinary: Negative for hematuria and difficulty urinating.  Skin: Negative for color change.  Neurological: Positive for dizziness. Negative for speech difficulty.  Hematological: Bruises/bleeds easily.  Psychiatric/Behavioral: Negative for confusion.    Allergies  Codeine; Tramadol; and Adhesive  Home Medications   Current Outpatient Rx  Name Route Sig Dispense Refill  . ALBUTEROL SULFATE HFA 108 (90 BASE) MCG/ACT IN AERS Inhalation Inhale 2 puffs into the lungs 2 (two) times daily as needed. For shortness of breath and wheezing    . ATENOLOL  100 MG PO TABS Oral Take 100 mg by mouth daily.    Marland Kitchen CAMPHOR-MENTHOL 0.5-0.5 % EX LOTN Topical Apply 1 application topically 2 (two) times daily.    Marland Kitchen ESOMEPRAZOLE MAGNESIUM 40 MG PO CPDR Oral Take 40 mg by mouth daily.     Marland Kitchen FLUOXETINE HCL 20 MG PO CAPS Oral Take 60 mg by mouth daily.    Marland Kitchen FLUTICASONE PROPIONATE  HFA 110 MCG/ACT IN AERO Inhalation Inhale 2 puffs into the lungs 2 (two) times daily.    . FUROSEMIDE 40 MG PO TABS Oral Take 40 mg by mouth daily as needed. For swelling    . GABAPENTIN 100 MG PO CAPS Oral Take 100 mg by mouth 3 (three) times daily.    Marland Kitchen HYDROCHLOROTHIAZIDE 25 MG PO TABS Oral Take 25 mg by  mouth daily. For high blood pressure     . MECLIZINE HCL 25 MG PO TABS Oral Take 25 mg by mouth every 8 (eight) hours as needed. For vertigo    . POTASSIUM CHLORIDE CRYS ER 20 MEQ PO TBCR Oral Take 1 tablet (20 mEq total) by mouth daily. 30 tablet 0  . PRESCRIPTION MEDICATION Topical Apply 1 application topically 2 (two) times daily. Cream compounded at [facility]  riamcinolone 0.1%, silver sulfadiazine 1%   3-1    . TACROLIMUS 0.1 % EX OINT Topical Apply 1 application topically 2 (two) times daily.    . WARFARIN SODIUM 5 MG PO TABS Oral Take 5 mg by mouth daily.    Marland Kitchen ZOLPIDEM TARTRATE 5 MG PO TABS Oral Take 5 mg by mouth at bedtime as needed. For sleep    . CLOBETASOL PROPIONATE 0.05 % EX OINT Topical Apply 1 application topically 2 (two) times daily.    Marland Kitchen CLONAZEPAM 0.5 MG PO TABS Oral Take 1 mg by mouth 2 (two) times daily as needed. For anxiety    . FERROUS SULFATE 325 (65 FE) MG PO TABS Oral Take 325 mg by mouth 2 (two) times daily with a meal.    . HYDROCODONE-ACETAMINOPHEN 5-325 MG PO TABS Oral Take 1-2 tablets by mouth every 4 (four) hours as needed. 50 tablet 0  . HYDROXYZINE HCL 25 MG PO TABS Oral Take 25 mg by mouth 3 (three) times daily as needed. itching    . ARTIFICIAL TEARS OP Ophthalmic Apply 1 drop to eye 4 (four) times daily. Dextran 70/hypromellose    . TRIAMCINOLONE ACETONIDE 0.1 % EX CREA Topical Apply 1 application topically 2 (two) times daily.      BP 141/85  Pulse 92  Temp 98.8 F (37.1 C) (Oral)  Resp 18  SpO2 99%  LMP 11/22/1996  Physical Exam  Nursing note and vitals reviewed. Constitutional: She is oriented to person, place, and time. She appears well-developed and well-nourished.  HENT:  Head: Normocephalic and atraumatic.  Eyes: EOM are normal. Pupils are equal, round, and reactive to light.  Neck: Neck supple.  Cardiovascular: Normal rate, regular rhythm and normal heart sounds.   No murmur heard. Pulmonary/Chest: Effort normal. No respiratory distress.    Abdominal: Soft. She exhibits no distension. There is no tenderness. There is no rebound and no guarding.  Neurological: She is alert and oriented to person, place, and time. No cranial nerve deficit. Coordination normal.       Patient is a little sluggish with cerebellar exam - but passed both finger to nose and heel to shin. - Horizontal nystagmnus noted - Dix Hallpike unchanged symptoms  Skin: Skin is  warm and dry.    ED Course  Procedures (including critical care time)  Labs Reviewed - No data to display No results found.   No diagnosis found.    MDM  DDx includes: Central vertigo:  Tumor  Stroke  ICH  Vertebrobasilar TIA  Peripheral Vertigo:  BPPV  Vestibular neuritis  Meniere disease  Migrainous vertigo  Ear Infection    Pt returns to the ED with persistent vertigo. CLINICALLY - CENTRAL VERTIGO (LONG LASTING, NOT  SEVERE) and Peripheral Vertigp (ringing in the Ears). She has hx of vertigo - but usually not this bad. Recent visit - CT negative, may be some orthostatics. Pt is on coumadin - chances of ischemia low - and no intervention available any ways. We will get Ct head to make sure there is no bleed.  Pt taking meclezine - 2 to 23 times a day.  We dont see any reason for emergent MRI Dont feel comfortable starting patient on valium - she is on klonipin.  Will need outpatient neuro workup.    Derwood Kaplan, MD 06/06/12 1720  8:18 PM Patient ambulating well - NO GAIT INSTABILITY NOTED. I spoke with patient extensively on the findings. I will give her Neurology follow up - she wants to see [facility], as she has seen them before. i asked her to be careful with her walk, take meclezine as ordered, and NOT TO WALK WHEN HAVING VERTIGO, EVEN IF IT IS MILD. She agrees.  Derwood Kaplan, MD 06/06/12 2021   Date: 06/06/2012  Rate:90  Rhythm: normal sinus rhythm  QRS Axis: normal  Intervals: normal  ST/T Wave abnormalities: nonspecific ST/T  changes  Conduction Disutrbances:none  Narrative Interpretation:   Old EKG Reviewed: unchanged    Derwood Kaplan, MD 06/06/12 2021

## 2012-06-16 ENCOUNTER — Telehealth: Payer: Self-pay | Admitting: Pharmacist

## 2012-06-16 NOTE — Telephone Encounter (Signed)
Pt called to tell us that she is still taking her Coumadin 5mg  daily. She can come on 07/01/12 @ 10:30am to recheck her INR.

## 2012-06-26 ENCOUNTER — Encounter (HOSPITAL_COMMUNITY): Payer: Self-pay | Admitting: Emergency Medicine

## 2012-06-26 ENCOUNTER — Emergency Department (HOSPITAL_COMMUNITY)
Admission: EM | Admit: 2012-06-26 | Discharge: 2012-06-26 | Disposition: A | Discharge status: Home / Self Care | Payer: Medicare Other | Attending: Emergency Medicine | Admitting: Emergency Medicine

## 2012-06-26 DIAGNOSIS — F209 Schizophrenia, unspecified: Secondary | ICD-10-CM | POA: Insufficient documentation

## 2012-06-26 DIAGNOSIS — G4730 Sleep apnea, unspecified: Secondary | ICD-10-CM | POA: Insufficient documentation

## 2012-06-26 DIAGNOSIS — K219 Gastro-esophageal reflux disease without esophagitis: Secondary | ICD-10-CM | POA: Insufficient documentation

## 2012-06-26 DIAGNOSIS — L408 Other psoriasis: Secondary | ICD-10-CM | POA: Insufficient documentation

## 2012-06-26 DIAGNOSIS — K649 Unspecified hemorrhoids: Secondary | ICD-10-CM | POA: Insufficient documentation

## 2012-06-26 DIAGNOSIS — F3289 Other specified depressive episodes: Secondary | ICD-10-CM | POA: Insufficient documentation

## 2012-06-26 DIAGNOSIS — H4089 Other specified glaucoma: Secondary | ICD-10-CM | POA: Insufficient documentation

## 2012-06-26 DIAGNOSIS — R51 Headache: Secondary | ICD-10-CM | POA: Insufficient documentation

## 2012-06-26 DIAGNOSIS — D509 Iron deficiency anemia, unspecified: Secondary | ICD-10-CM | POA: Insufficient documentation

## 2012-06-26 DIAGNOSIS — Z86718 Personal history of other venous thrombosis and embolism: Secondary | ICD-10-CM | POA: Insufficient documentation

## 2012-06-26 DIAGNOSIS — R42 Dizziness and giddiness: Secondary | ICD-10-CM | POA: Insufficient documentation

## 2012-06-26 DIAGNOSIS — J45909 Unspecified asthma, uncomplicated: Secondary | ICD-10-CM | POA: Insufficient documentation

## 2012-06-26 DIAGNOSIS — M129 Arthropathy, unspecified: Secondary | ICD-10-CM | POA: Insufficient documentation

## 2012-06-26 DIAGNOSIS — Z888 Allergy status to other drugs, medicaments and biological substances status: Secondary | ICD-10-CM | POA: Insufficient documentation

## 2012-06-26 DIAGNOSIS — I10 Essential (primary) hypertension: Secondary | ICD-10-CM | POA: Insufficient documentation

## 2012-06-26 DIAGNOSIS — Z86711 Personal history of pulmonary embolism: Secondary | ICD-10-CM | POA: Insufficient documentation

## 2012-06-26 DIAGNOSIS — F411 Generalized anxiety disorder: Secondary | ICD-10-CM | POA: Insufficient documentation

## 2012-06-26 DIAGNOSIS — G8929 Other chronic pain: Secondary | ICD-10-CM | POA: Insufficient documentation

## 2012-06-26 DIAGNOSIS — F329 Major depressive disorder, single episode, unspecified: Secondary | ICD-10-CM | POA: Insufficient documentation

## 2012-06-26 MED ORDER — MECLIZINE HCL 50 MG PO TABS: 50.0000 mg | ORAL_TABLET | Freq: Three times a day (TID) | ORAL | Status: DC | PRN

## 2012-06-26 MED ORDER — TRAMADOL HCL 50 MG PO TABS: 50.0000 mg | ORAL_TABLET | Freq: Four times a day (QID) | ORAL | Status: DC | PRN

## 2012-06-26 NOTE — ED Notes (Signed)
Pt reports back pain, recurrent. Pt stated that she will be seen at [facility] in December for medical care

## 2012-06-26 NOTE — ED Provider Notes (Signed)
History     CSN: [id number]  Arrival date & time 06/26/12  1112   First MD Initiated Contact with Patient 06/26/12 1136      Chief Complaint  Patient presents with  . Back Pain    pt reports persistant back pain    (Consider location/radiation/quality/duration/timing/severity/associated sxs/prior treatment) HPI Comments: This is a 71 year old female, who presents to the emergency department with a chief complaint med rec. She states that she is out of her vertigo and back pain medicine, but that she is going to be seen her primary care provider on Monday, and would like something to hold her over. Its that her symptoms are unchanged, but that they improve with her prescribed medications. She is having mild pain. The pain does not radiate.  The history is provided by the patient. No language interpreter was used.    Past Medical History  Diagnosis Date  . Depression   . Schizophrenia   . Arthritis   . Hyperlipidemia   . Hypertension   . Chronic pain   . 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 DVT (deep venous thrombosis)     per 01/17/10 d/c summary- "remote hx of dvt"?  . Iron deficiency anemia   . Chronic headaches   . Sleep apnea   . Glaucoma   . Hemorrhoids   . Asthma   . Anxiety   . GERD (gastroesophageal reflux disease)   . Psoriasis   . Psoriasis 04/29/2012    New onset evaluated by Dr Marylou Flesher, Dermatology, [facility] 6/13  Rx 0.1% Tacrolimus ointment    Past Surgical History  Procedure Date  . Spinal fusion     2004  . Joint replacement right knee 11/2008  . Tubal ligation year 59  . Carpal tunnel release     bilateral     Family History  Problem Relation Age of Onset  . Diabetes Sister   . Colon cancer Brother 40    History  Substance Use Topics  . Smoking status: Never Smoker   . Smokeless tobacco: Never Used  . Alcohol Use: No    OB History    Grav Para Term Preterm Abortions TAB SAB Ect Mult Living         Review of Systems  Constitutional: Negative for fever.  Eyes: Negative for visual disturbance.  Respiratory: Negative for shortness of breath.   Cardiovascular: Negative for chest pain.  Gastrointestinal: Negative for abdominal pain.  Genitourinary: Negative for dysuria.  Musculoskeletal: Positive for back pain.  Skin:       psoriasis.  Neurological: Positive for dizziness. Negative for weakness.  All other systems reviewed and are negative.    Allergies  Codeine; Tramadol; and Adhesive  Home Medications   Current Outpatient Rx  Name Route Sig Dispense Refill  . ALBUTEROL SULFATE HFA 108 (90 BASE) MCG/ACT IN AERS Inhalation Inhale 2 puffs into the lungs 2 (two) times daily as needed. For shortness of breath and wheezing    . ATENOLOL 100 MG PO TABS Oral Take 100 mg by mouth daily.    Marland Kitchen CAMPHOR-MENTHOL 0.5-0.5 % EX LOTN Topical Apply 1 application topically 2 (two) times daily.    Marland Kitchen CLOBETASOL PROPIONATE 0.05 % EX OINT Topical Apply 1 application topically 2 (two) times daily.    Marland Kitchen CLONAZEPAM 0.5 MG PO TABS Oral Take 1 mg by mouth 2 (two) times daily as needed. For anxiety    . ESOMEPRAZOLE MAGNESIUM 40 MG  PO CPDR Oral Take 40 mg by mouth daily.     Marland Kitchen FERROUS SULFATE 325 (65 FE) MG PO TABS Oral Take 325 mg by mouth 2 (two) times daily with a meal.    . FLUOXETINE HCL 20 MG PO CAPS Oral Take 60 mg by mouth daily.    Marland Kitchen FLUTICASONE PROPIONATE  HFA 110 MCG/ACT IN AERO Inhalation Inhale 2 puffs into the lungs 2 (two) times daily.    . FUROSEMIDE 40 MG PO TABS Oral Take 40 mg by mouth daily as needed. For swelling    . GABAPENTIN 100 MG PO CAPS Oral Take 100 mg by mouth 3 (three) times daily.    Marland Kitchen HYDROCHLOROTHIAZIDE 25 MG PO TABS Oral Take 25 mg by mouth daily. For high blood pressure     . HYDROXYZINE HCL 25 MG PO TABS Oral Take 25 mg by mouth 3 (three) times daily as needed. itching    . ARTIFICIAL TEARS OP Ophthalmic Apply 1 drop to eye 4 (four) times daily. Dextran  70/hypromellose    . MECLIZINE HCL 25 MG PO TABS Oral Take 25 mg by mouth every 8 (eight) hours as needed. For vertigo    . MECLIZINE HCL 50 MG PO TABS Oral Take 1 tablet (50 mg total) by mouth 3 (three) times daily as needed. 30 tablet 0  . POTASSIUM CHLORIDE CRYS ER 20 MEQ PO TBCR Oral Take 1 tablet (20 mEq total) by mouth daily. 30 tablet 0  . PRESCRIPTION MEDICATION Topical Apply 1 application topically 2 (two) times daily. Cream compounded at [facility]  riamcinolone 0.1%, silver sulfadiazine 1%   3-1    . TACROLIMUS 0.1 % EX OINT Topical Apply 1 application topically 2 (two) times daily.    . TRAMADOL HCL 50 MG PO TABS Oral Take 1 tablet (50 mg total) by mouth every 6 (six) hours as needed for pain. 12 tablet 0  . TRIAMCINOLONE ACETONIDE 0.1 % EX CREA Topical Apply 1 application topically 2 (two) times daily.    . WARFARIN SODIUM 5 MG PO TABS Oral Take 5 mg by mouth daily.    Marland Kitchen ZOLPIDEM TARTRATE 5 MG PO TABS Oral Take 5 mg by mouth at bedtime as needed. For sleep      BP 144/83  Pulse 88  Temp 98.2 F (36.8 C) (Oral)  Resp 18  Wt 240 lb (108.863 kg)  SpO2 97%  LMP 11/22/1996  Physical Exam  Nursing note and vitals reviewed. Constitutional: She is oriented to person, place, and time. She appears well-developed and well-nourished.  HENT:  Head: Normocephalic and atraumatic.  Eyes: Conjunctivae normal and EOM are normal. Pupils are equal, round, and reactive to light.  Neck: Normal range of motion. Neck supple.  Cardiovascular: Normal rate, regular rhythm and normal heart sounds.   Pulmonary/Chest: Effort normal and breath sounds normal.  Abdominal: Soft. Bowel sounds are normal.  Musculoskeletal: Normal range of motion.  Neurological: She is alert and oriented to person, place, and time.  Skin: Skin is warm and dry.  Psychiatric: She has a normal mood and affect. Her behavior is normal. Judgment and thought content normal.    ED Course  Procedures (including critical care  time)    1. Vertigo   2. Other chronic pain       MDM  71 yo female who presents for med reconciliation. Will prescribe sufficient tramadol and meclizine to tide her over until she meets with her primary care physician on Monday morning. The patient  is stable and ready for discharge.       Roxy Horseman, PA-C 06/26/12 1205

## 2012-06-26 NOTE — ED Provider Notes (Signed)
Medical screening examination/treatment/procedure(s) were performed by non-physician practitioner and as supervising physician I was immediately available for consultation/collaboration.   Amberleigh Gerken, MD 06/26/12 1606 

## 2012-07-01 ENCOUNTER — Other Ambulatory Visit (HOSPITAL_BASED_OUTPATIENT_CLINIC_OR_DEPARTMENT_OTHER): Payer: Medicare Other | Admitting: Lab

## 2012-07-01 ENCOUNTER — Ambulatory Visit (HOSPITAL_BASED_OUTPATIENT_CLINIC_OR_DEPARTMENT_OTHER): Payer: Medicare Other | Admitting: Pharmacist

## 2012-07-01 ENCOUNTER — Other Ambulatory Visit: Payer: Self-pay | Admitting: Pharmacist

## 2012-07-01 DIAGNOSIS — D6859 Other primary thrombophilia: Secondary | ICD-10-CM

## 2012-07-01 DIAGNOSIS — D6851 Activated protein C resistance: Secondary | ICD-10-CM

## 2012-07-01 DIAGNOSIS — Z7901 Long term (current) use of anticoagulants: Secondary | ICD-10-CM

## 2012-07-01 DIAGNOSIS — I2699 Other pulmonary embolism without acute cor pulmonale: Secondary | ICD-10-CM

## 2012-07-01 DIAGNOSIS — D509 Iron deficiency anemia, unspecified: Secondary | ICD-10-CM

## 2012-07-01 LAB — PROTIME-INR
INR: 2.3 (ref 2.00–3.50)
Protime: 27.6 Seconds — ABNORMAL HIGH (ref 10.6–13.4)

## 2012-07-01 NOTE — Progress Notes (Signed)
INR therapeutic today on 5mg  daily (2.3) Medlist updated today, but no recent medication changes.  Pt's diet has largely unchanged from baseline, but she is craving ice.  Her pica is so bad that she will go through a 20lb bag of ice in a week, and she brought a tupperware container of ice with her today to snack on.   No other problems currently.  She has not had a bleeding incident with her varicose veins since September.  She has seen dermatology, and been diagnosed with psoriasis - which is the main cause of these bleeds. Will have pt continue current dose, and recheck INR in 2 weeks.  Will see if Dr. Cyndie Chime wishes to add iron studies and ferritin to be drawn at the same time.

## 2012-07-15 ENCOUNTER — Other Ambulatory Visit (HOSPITAL_BASED_OUTPATIENT_CLINIC_OR_DEPARTMENT_OTHER): Payer: Medicare Other | Admitting: Lab

## 2012-07-15 ENCOUNTER — Ambulatory Visit (HOSPITAL_BASED_OUTPATIENT_CLINIC_OR_DEPARTMENT_OTHER): Payer: Medicare Other | Admitting: Pharmacist

## 2012-07-15 DIAGNOSIS — D6851 Activated protein C resistance: Secondary | ICD-10-CM

## 2012-07-15 DIAGNOSIS — Z7901 Long term (current) use of anticoagulants: Secondary | ICD-10-CM

## 2012-07-15 DIAGNOSIS — D509 Iron deficiency anemia, unspecified: Secondary | ICD-10-CM

## 2012-07-15 DIAGNOSIS — I2699 Other pulmonary embolism without acute cor pulmonale: Secondary | ICD-10-CM

## 2012-07-15 DIAGNOSIS — D6859 Other primary thrombophilia: Secondary | ICD-10-CM

## 2012-07-15 LAB — IRON AND TIBC: UIBC: 385 ug/dL (ref 125–400)

## 2012-07-15 LAB — PROTIME-INR: INR: 2.1 (ref 2.00–3.50)

## 2012-07-15 LAB — CBC WITH DIFFERENTIAL/PLATELET
Basophils Absolute: 0 10*3/uL (ref 0.0–0.1)
EOS%: 3.2 % (ref 0.0–7.0)
HGB: 12.4 g/dL (ref 11.6–15.9)
MCH: 32.3 pg (ref 25.1–34.0)
MCV: 94.2 fL (ref 79.5–101.0)
MONO%: 8.3 % (ref 0.0–14.0)
RBC: 3.85 10*6/uL (ref 3.70–5.45)
RDW: 14.5 % (ref 11.2–14.5)

## 2012-07-15 NOTE — Progress Notes (Signed)
Gabapentin added to medication list.  Unsure how long pt has been taking this.  INR at goal and stable on 5mg  daily.  Will check PT/INR in 1 month.  Will call in warfarin refill to [facility] ([phone]).

## 2012-07-16 ENCOUNTER — Telehealth: Payer: Self-pay | Admitting: *Deleted

## 2012-07-16 NOTE — Telephone Encounter (Signed)
Pt called asking for results of labs done yest.  She also would like Dr. Cyndie Chime to refer her to a PCP.  She wants an old fashioned Dr. In his 43's, "a country Dr."  Note to Dr Cyndie Chime.

## 2012-07-17 ENCOUNTER — Telehealth: Payer: Self-pay | Admitting: *Deleted

## 2012-07-17 ENCOUNTER — Other Ambulatory Visit: Payer: Self-pay | Admitting: *Deleted

## 2012-07-17 DIAGNOSIS — I2699 Other pulmonary embolism without acute cor pulmonale: Secondary | ICD-10-CM

## 2012-07-17 NOTE — Telephone Encounter (Signed)
Talked to patient gave her appt with Dr. Shelle Iron [facility] tomorrow 07/18/12 , nurse aware of appt

## 2012-07-17 NOTE — Telephone Encounter (Signed)
Pt notified per Dr Cyndie Chime that "Hgb 12.4, stable, improved c/w previous" & he suggested Dr Lorin Picket N[facility] for PCP.  Referral will be made.

## 2012-07-18 ENCOUNTER — Institutional Professional Consult (permissible substitution): Payer: Medicare Other | Admitting: Pulmonary Disease

## 2012-07-18 ENCOUNTER — Emergency Department (HOSPITAL_COMMUNITY)
Admission: EM | Admit: 2012-07-18 | Discharge: 2012-07-18 | Disposition: A | Discharge status: Home / Self Care | Payer: Medicare Other | Attending: Emergency Medicine | Admitting: Emergency Medicine

## 2012-07-18 ENCOUNTER — Encounter (HOSPITAL_COMMUNITY): Payer: Self-pay | Admitting: Emergency Medicine

## 2012-07-18 DIAGNOSIS — F3289 Other specified depressive episodes: Secondary | ICD-10-CM | POA: Insufficient documentation

## 2012-07-18 DIAGNOSIS — F411 Generalized anxiety disorder: Secondary | ICD-10-CM | POA: Insufficient documentation

## 2012-07-18 DIAGNOSIS — K219 Gastro-esophageal reflux disease without esophagitis: Secondary | ICD-10-CM | POA: Insufficient documentation

## 2012-07-18 DIAGNOSIS — G8929 Other chronic pain: Secondary | ICD-10-CM | POA: Insufficient documentation

## 2012-07-18 DIAGNOSIS — E785 Hyperlipidemia, unspecified: Secondary | ICD-10-CM | POA: Insufficient documentation

## 2012-07-18 DIAGNOSIS — J45909 Unspecified asthma, uncomplicated: Secondary | ICD-10-CM | POA: Insufficient documentation

## 2012-07-18 DIAGNOSIS — Z86711 Personal history of pulmonary embolism: Secondary | ICD-10-CM | POA: Insufficient documentation

## 2012-07-18 DIAGNOSIS — H409 Unspecified glaucoma: Secondary | ICD-10-CM | POA: Insufficient documentation

## 2012-07-18 DIAGNOSIS — R0681 Apnea, not elsewhere classified: Secondary | ICD-10-CM | POA: Insufficient documentation

## 2012-07-18 DIAGNOSIS — F329 Major depressive disorder, single episode, unspecified: Secondary | ICD-10-CM | POA: Insufficient documentation

## 2012-07-18 DIAGNOSIS — Z8719 Personal history of other diseases of the digestive system: Secondary | ICD-10-CM | POA: Insufficient documentation

## 2012-07-18 DIAGNOSIS — Z79899 Other long term (current) drug therapy: Secondary | ICD-10-CM | POA: Insufficient documentation

## 2012-07-18 DIAGNOSIS — I10 Essential (primary) hypertension: Secondary | ICD-10-CM | POA: Insufficient documentation

## 2012-07-18 DIAGNOSIS — M129 Arthropathy, unspecified: Secondary | ICD-10-CM | POA: Insufficient documentation

## 2012-07-18 DIAGNOSIS — F209 Schizophrenia, unspecified: Secondary | ICD-10-CM | POA: Insufficient documentation

## 2012-07-18 DIAGNOSIS — D509 Iron deficiency anemia, unspecified: Secondary | ICD-10-CM | POA: Insufficient documentation

## 2012-07-18 DIAGNOSIS — M549 Dorsalgia, unspecified: Secondary | ICD-10-CM | POA: Insufficient documentation

## 2012-07-18 DIAGNOSIS — Z86718 Personal history of other venous thrombosis and embolism: Secondary | ICD-10-CM | POA: Insufficient documentation

## 2012-07-18 DIAGNOSIS — L408 Other psoriasis: Secondary | ICD-10-CM | POA: Insufficient documentation

## 2012-07-18 MED ORDER — HYDROCODONE-ACETAMINOPHEN 5-325 MG PO TABS: 2.0000 | ORAL_TABLET | ORAL | Status: DC | PRN

## 2012-07-18 MED ORDER — HYDROCODONE-ACETAMINOPHEN 5-325 MG PO TABS
2.0000 | ORAL_TABLET | Freq: Once | ORAL | Status: AC
  Administered 2012-07-18: 2 via ORAL
  Filled 2012-07-18: qty 2

## 2012-07-18 NOTE — ED Provider Notes (Signed)
History     CSN: [id number]  Arrival date & time 07/18/12  1548   First MD Initiated Contact with Patient 07/18/12 1638      Chief Complaint  Patient presents with  . Extremity Pain    (Consider location/radiation/quality/duration/timing/severity/associated sxs/prior treatment) Patient is a 71 y.o. female presenting with extremity pain. The history is provided by the patient.  Extremity Pain   Patient here with worsening chronic back pain. Patient well-known to me has had these symptoms from her arthritis for many years. Patient is unable to obtain a PCP at this time however she does have an appointment on Monday to establish regular care. Denies any change in her bowel or bladder function. Denies any perineal numbness. No urinary symptoms. No new injuries. Symptoms are characterized as sharp and worse with movements. She denies any foot drop Past Medical History  Diagnosis Date  . Depression   . Schizophrenia   . Arthritis   . Hyperlipidemia   . Hypertension   . Chronic pain   . 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 DVT (deep venous thrombosis)     per 01/17/10 d/c summary- "remote hx of dvt"?  . Iron deficiency anemia   . Chronic headaches   . Sleep apnea   . Glaucoma(365)   . Hemorrhoids   . Asthma   . Anxiety   . GERD (gastroesophageal reflux disease)   . Psoriasis   . Psoriasis 04/29/2012    New onset evaluated by Dr Marylou Flesher, Dermatology, [facility] 6/13  Rx 0.1% Tacrolimus ointment    Past Surgical History  Procedure Date  . Spinal fusion     2004  . Joint replacement right knee 11/2008  . Tubal ligation year 28  . Carpal tunnel release     bilateral     Family History  Problem Relation Age of Onset  . Diabetes Sister   . Colon cancer Brother 40    History  Substance Use Topics  . Smoking status: Never Smoker   . Smokeless tobacco: Never Used  . Alcohol Use: No    OB History    Grav Para Term Preterm Abortions  TAB SAB Ect Mult Living                  Review of Systems  All other systems reviewed and are negative.    Allergies  Codeine; Tramadol; and Adhesive  Home Medications   Current Outpatient Rx  Name Route Sig Dispense Refill  . ALBUTEROL SULFATE HFA 108 (90 BASE) MCG/ACT IN AERS Inhalation Inhale 2 puffs into the lungs 2 (two) times daily as needed. For shortness of breath and wheezing    . ATENOLOL 100 MG PO TABS Oral Take 100 mg by mouth daily.    Marland Kitchen CAMPHOR-MENTHOL 0.5-0.5 % EX LOTN Topical Apply 1 application topically 2 (two) times daily.    Marland Kitchen CLOBETASOL PROPIONATE 0.05 % EX OINT Topical Apply 1 application topically 2 (two) times daily.    Marland Kitchen CLONAZEPAM 0.5 MG PO TABS Oral Take 1 mg by mouth 2 (two) times daily as needed. For anxiety    . ESOMEPRAZOLE MAGNESIUM 40 MG PO CPDR Oral Take 40 mg by mouth daily.     Marland Kitchen FERROUS SULFATE 325 (65 FE) MG PO TABS Oral Take 325 mg by mouth 2 (two) times daily.     Marland Kitchen FLUOXETINE HCL 20 MG PO CAPS Oral Take 60 mg by mouth daily.    Marland Kitchen  FLUTICASONE PROPIONATE  HFA 110 MCG/ACT IN AERO Inhalation Inhale 2 puffs into the lungs 2 (two) times daily.    . FUROSEMIDE 40 MG PO TABS Oral Take 40 mg by mouth daily as needed. For swelling    . GABAPENTIN 100 MG PO CAPS Oral Take 100 mg by mouth 3 (three) times daily.    Marland Kitchen HYDROCHLOROTHIAZIDE 25 MG PO TABS Oral Take 25 mg by mouth daily. For high blood pressure     . HYDROCODONE-ACETAMINOPHEN 5-325 MG PO TABS Oral Take 1 tablet by mouth Every 6 hours as needed.    Marland Kitchen HYDROXYZINE HCL 25 MG PO TABS Oral Take 25 mg by mouth 3 (three) times daily as needed. itching    . ARTIFICIAL TEARS OP Ophthalmic Apply 1 drop to eye 4 (four) times daily. Dextran 70/hypromellose    . MECLIZINE HCL 25 MG PO TABS Oral Take 25 mg by mouth every 8 (eight) hours as needed. For vertigo    . MECLIZINE HCL 50 MG PO TABS Oral Take 1 tablet (50 mg total) by mouth 3 (three) times daily as needed. 30 tablet 0  . MONTELUKAST SODIUM 10 MG PO  TABS Oral Take 1 tablet by mouth Daily.    Marland Kitchen POTASSIUM CHLORIDE CRYS ER 20 MEQ PO TBCR Oral Take 1 tablet (20 mEq total) by mouth daily. 30 tablet 0  . PRESCRIPTION MEDICATION Topical Apply 1 application topically 2 (two) times daily. Cream compounded at [facility]  riamcinolone 0.1%, silver sulfadiazine 1%   3-1    . TACROLIMUS 0.1 % EX OINT Topical Apply 1 application topically 2 (two) times daily.    . TRAMADOL HCL 50 MG PO TABS Oral Take 1 tablet (50 mg total) by mouth every 6 (six) hours as needed for pain. 12 tablet 0  . TRIAMCINOLONE ACETONIDE 0.1 % EX CREA Topical Apply 1 application topically 2 (two) times daily.    . WARFARIN SODIUM 5 MG PO TABS Oral Take 5 mg by mouth daily.    Marland Kitchen ZOLPIDEM TARTRATE 5 MG PO TABS Oral Take 5 mg by mouth at bedtime as needed. For sleep      BP 140/93  Pulse 86  Temp 98.3 F (36.8 C) (Oral)  Resp 18  SpO2 96%  LMP 11/22/1996  Physical Exam  Nursing note and vitals reviewed. Constitutional: She is oriented to person, place, and time. She appears well-developed and well-nourished.  Non-toxic appearance. No distress.  HENT:  Head: Normocephalic and atraumatic.  Eyes: Conjunctivae normal, EOM and lids are normal. Pupils are equal, round, and reactive to light.  Neck: Normal range of motion. Neck supple. No tracheal deviation present. No mass present.  Cardiovascular: Normal rate, regular rhythm and normal heart sounds.  Exam reveals no gallop.   No murmur heard. Pulmonary/Chest: Effort normal and breath sounds normal. No stridor. No respiratory distress. She has no decreased breath sounds. She has no wheezes. She has no rhonchi. She has no rales.  Abdominal: Soft. Normal appearance and bowel sounds are normal. She exhibits no distension. There is no tenderness. There is no rebound and no CVA tenderness.  Musculoskeletal: Normal range of motion. She exhibits no edema and no tenderness.       Pt without neuro deficits , gait normal  Neurological: She is  alert and oriented to person, place, and time. She has normal strength. No cranial nerve deficit or sensory deficit. GCS eye subscore is 4. GCS verbal subscore is 5. GCS motor subscore is 6.  Skin:  Skin is warm and dry. No abrasion and no rash noted.  Psychiatric: She has a normal mood and affect. Her speech is normal and behavior is normal.    ED Course  Procedures (including critical care time)  Labs Reviewed - No data to display No results found.   No diagnosis found.    MDM  Will tx pt for her chronic pain        [name], MD 07/18/12 [phone]

## 2012-07-18 NOTE — ED Notes (Signed)
Pt presenting to ed with c/o "I have pain all over my body" pt states "I just want to get something until I can get in with a pcp." pt states she has been taking motrin and tylenol #3 but they don't work. Pt states history of arthritis

## 2012-07-21 ENCOUNTER — Ambulatory Visit: Payer: Medicare Other | Admitting: Family

## 2012-07-21 DIAGNOSIS — Z0289 Encounter for other administrative examinations: Secondary | ICD-10-CM

## 2012-08-05 ENCOUNTER — Encounter (HOSPITAL_COMMUNITY): Payer: Self-pay | Admitting: Emergency Medicine

## 2012-08-05 ENCOUNTER — Emergency Department (HOSPITAL_COMMUNITY)
Admission: EM | Admit: 2012-08-05 | Discharge: 2012-08-05 | Disposition: A | Discharge status: Home / Self Care | Payer: Medicare Other | Attending: Emergency Medicine | Admitting: Emergency Medicine

## 2012-08-05 ENCOUNTER — Emergency Department (HOSPITAL_COMMUNITY): Payer: Medicare Other

## 2012-08-05 DIAGNOSIS — K219 Gastro-esophageal reflux disease without esophagitis: Secondary | ICD-10-CM | POA: Insufficient documentation

## 2012-08-05 DIAGNOSIS — Z79899 Other long term (current) drug therapy: Secondary | ICD-10-CM | POA: Insufficient documentation

## 2012-08-05 DIAGNOSIS — E785 Hyperlipidemia, unspecified: Secondary | ICD-10-CM | POA: Insufficient documentation

## 2012-08-05 DIAGNOSIS — Z8619 Personal history of other infectious and parasitic diseases: Secondary | ICD-10-CM | POA: Insufficient documentation

## 2012-08-05 DIAGNOSIS — E669 Obesity, unspecified: Secondary | ICD-10-CM | POA: Insufficient documentation

## 2012-08-05 DIAGNOSIS — M7989 Other specified soft tissue disorders: Secondary | ICD-10-CM

## 2012-08-05 DIAGNOSIS — I83893 Varicose veins of bilateral lower extremities with other complications: Secondary | ICD-10-CM | POA: Insufficient documentation

## 2012-08-05 DIAGNOSIS — R609 Edema, unspecified: Secondary | ICD-10-CM | POA: Insufficient documentation

## 2012-08-05 DIAGNOSIS — I10 Essential (primary) hypertension: Secondary | ICD-10-CM | POA: Insufficient documentation

## 2012-08-05 DIAGNOSIS — R0602 Shortness of breath: Secondary | ICD-10-CM | POA: Insufficient documentation

## 2012-08-05 DIAGNOSIS — D649 Anemia, unspecified: Secondary | ICD-10-CM | POA: Insufficient documentation

## 2012-08-05 DIAGNOSIS — I83813 Varicose veins of bilateral lower extremities with pain: Secondary | ICD-10-CM

## 2012-08-05 DIAGNOSIS — F3289 Other specified depressive episodes: Secondary | ICD-10-CM | POA: Insufficient documentation

## 2012-08-05 DIAGNOSIS — F329 Major depressive disorder, single episode, unspecified: Secondary | ICD-10-CM | POA: Insufficient documentation

## 2012-08-05 DIAGNOSIS — F209 Schizophrenia, unspecified: Secondary | ICD-10-CM | POA: Insufficient documentation

## 2012-08-05 DIAGNOSIS — Z8739 Personal history of other diseases of the musculoskeletal system and connective tissue: Secondary | ICD-10-CM | POA: Insufficient documentation

## 2012-08-05 DIAGNOSIS — R55 Syncope and collapse: Secondary | ICD-10-CM | POA: Insufficient documentation

## 2012-08-05 DIAGNOSIS — J45909 Unspecified asthma, uncomplicated: Secondary | ICD-10-CM | POA: Insufficient documentation

## 2012-08-05 DIAGNOSIS — Z7901 Long term (current) use of anticoagulants: Secondary | ICD-10-CM | POA: Insufficient documentation

## 2012-08-05 DIAGNOSIS — I2699 Other pulmonary embolism without acute cor pulmonale: Secondary | ICD-10-CM

## 2012-08-05 LAB — PROTIME-INR
INR: 2.1 — ABNORMAL HIGH (ref 0.00–1.49)
Prothrombin Time: 22.7 seconds — ABNORMAL HIGH (ref 11.6–15.2)

## 2012-08-05 LAB — BASIC METABOLIC PANEL
Calcium: 9.8 mg/dL (ref 8.4–10.5)
GFR calc non Af Amer: 84 mL/min — ABNORMAL LOW (ref >90)
Glucose, Bld: 95 mg/dL (ref 70–99)
Sodium: 138 mEq/L (ref 135–145)

## 2012-08-05 LAB — CBC WITH DIFFERENTIAL/PLATELET
Basophils Relative: 0 % (ref 0–1)
Eosinophils Absolute: 0.3 10*3/uL (ref 0.0–0.7)
Lymphs Abs: 1.4 10*3/uL (ref 0.7–4.0)
MCH: 31.2 pg (ref 26.0–34.0)
Neutrophils Relative %: 66 % (ref 43–77)
Platelets: 326 10*3/uL (ref 150–400)
RBC: 3.75 MIL/uL — ABNORMAL LOW (ref 3.87–5.11)

## 2012-08-05 MED ORDER — FUROSEMIDE 40 MG PO TABS: 40.0000 mg | ORAL_TABLET | Freq: Every day | ORAL | Status: DC

## 2012-08-05 MED ORDER — FUROSEMIDE 40 MG PO TABS
40.0000 mg | ORAL_TABLET | Freq: Once | ORAL | Status: AC
  Administered 2012-08-05: 40 mg via ORAL
  Filled 2012-08-05: qty 1

## 2012-08-05 NOTE — ED Provider Notes (Signed)
History     CSN: [id number]  Arrival date & time 08/05/12  1120   First MD Initiated Contact with Patient 08/05/12 1238      Chief Complaint  Patient presents with  . Leg Swelling    (Consider location/radiation/quality/duration/timing/severity/associated sxs/prior treatment) HPI Complains of bilateral leg swelling for one month. The left leg became worse over the past week. She denies noncompliance with medication. Has run out of Lasix. Denies other complaint. Admits to mild shortness of breath which is chronic and unchanged for several weeks. No chest pain. No other complaint. No other associated symptoms . nochest pain  Past Medical History  Diagnosis Date  . Depression   . Schizophrenia   . Arthritis   . Hyperlipidemia   . Hypertension   . Chronic pain   . 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 DVT (deep venous thrombosis)     per 01/17/10 d/c summary- "remote hx of dvt"?  . Iron deficiency anemia   . Chronic headaches   . Sleep apnea   . Glaucoma(365)   . Hemorrhoids   . Asthma   . Anxiety   . GERD (gastroesophageal reflux disease)   . Psoriasis   . Psoriasis 04/29/2012    New onset evaluated by Dr Marylou Flesher, Dermatology, [facility] 6/13  Rx 0.1% Tacrolimus ointment    Past Surgical History  Procedure Date  . Spinal fusion     2004  . Joint replacement right knee 11/2008  . Tubal ligation year 39  . Carpal tunnel release     bilateral     Family History  Problem Relation Age of Onset  . Diabetes Sister   . Colon cancer Brother 40    History  Substance Use Topics  . Smoking status: Never Smoker   . Smokeless tobacco: Never Used  . Alcohol Use: No    OB History    Grav Para Term Preterm Abortions TAB SAB Ect Mult Living                  Review of Systems  Constitutional: Negative.   HENT: Negative.   Respiratory: Positive for shortness of breath.        Several weeks, unchanged  Cardiovascular: Positive for leg  swelling.  Gastrointestinal: Negative.   Musculoskeletal: Negative.   Skin: Negative.   Neurological: Negative.   Hematological: Negative.   Psychiatric/Behavioral: Negative.   All other systems reviewed and are negative.    Allergies  Adhesive  Home Medications   Current Outpatient Rx  Name  Route  Sig  Dispense  Refill  . ALBUTEROL SULFATE HFA 108 (90 BASE) MCG/ACT IN AERS   Inhalation   Inhale 2 puffs into the lungs 2 (two) times daily as needed. For shortness of breath and wheezing         . ATENOLOL 100 MG PO TABS   Oral   Take 100 mg by mouth daily.         Marland Kitchen CAMPHOR-MENTHOL 0.5-0.5 % EX LOTN   Topical   Apply 1 application topically 2 (two) times daily.         . CELECOXIB 200 MG PO CAPS   Oral   Take 200 mg by mouth daily.         Marland Kitchen CLOBETASOL PROPIONATE 0.05 % EX OINT   Topical   Apply 1 application topically 2 (two) times daily.         Marland Kitchen CLONAZEPAM 0.5  MG PO TABS   Oral   Take 1 mg by mouth 2 (two) times daily as needed. For anxiety         . ESOMEPRAZOLE MAGNESIUM 40 MG PO CPDR   Oral   Take 80 mg by mouth daily.          Marland Kitchen FERROUS SULFATE 325 (65 FE) MG PO TABS   Oral   Take 325 mg by mouth 2 (two) times daily.          Marland Kitchen FLUOXETINE HCL 20 MG PO CAPS   Oral   Take 60 mg by mouth daily.         Marland Kitchen FLUTICASONE PROPIONATE  HFA 110 MCG/ACT IN AERO   Inhalation   Inhale 2 puffs into the lungs 2 (two) times daily.         . FUROSEMIDE 40 MG PO TABS   Oral   Take 40 mg by mouth daily as needed. For swelling         . HYDROCHLOROTHIAZIDE 25 MG PO TABS   Oral   Take 25 mg by mouth daily. For high blood pressure          . HYDROCODONE-ACETAMINOPHEN 5-325 MG PO TABS   Oral   Take 1 tablet by mouth Every 6 hours as needed.         Marland Kitchen HYDROXYZINE HCL 25 MG PO TABS   Oral   Take 25 mg by mouth 3 (three) times daily as needed. itching         . ARTIFICIAL TEARS OP   Ophthalmic   Apply 1 drop to eye 4 (four) times  daily. Dextran 70/hypromellose         . MECLIZINE HCL 50 MG PO TABS   Oral   Take 1 tablet (50 mg total) by mouth 3 (three) times daily as needed.   30 tablet   0   . PRESCRIPTION MEDICATION   Topical   Apply 1 application topically 2 (two) times daily. Cream compounded at [facility]  riamcinolone 0.1%, silver sulfadiazine 1%   3-1         . TACROLIMUS 0.1 % EX OINT   Topical   Apply 1 application topically 2 (two) times daily.         . TRAMADOL HCL 50 MG PO TABS   Oral   Take 1 tablet (50 mg total) by mouth every 6 (six) hours as needed for pain.   12 tablet   0   . TRIAMCINOLONE ACETONIDE 0.1 % EX CREA   Topical   Apply 1 application topically 2 (two) times daily.         . WARFARIN SODIUM 5 MG PO TABS   Oral   Take 5 mg by mouth daily.           BP 121/61  Pulse 56  Temp 98.2 F (36.8 C) (Oral)  Resp 16  Ht 5\' 5"  (1.651 m)  Wt 249 lb (112.946 kg)  BMI 41.44 kg/m2  SpO2 95%  LMP 11/22/1996  Physical Exam  Nursing note and vitals reviewed. Constitutional: She appears well-developed and well-nourished.  HENT:  Head: Normocephalic and atraumatic.  Eyes: Conjunctivae normal are normal. Pupils are equal, round, and reactive to light.  Neck: Neck supple. No tracheal deviation present. No thyromegaly present.  Cardiovascular: Normal rate and regular rhythm.   No murmur heard. Pulmonary/Chest: Effort normal and breath sounds normal.  Abdominal: Soft. Bowel sounds are normal. She exhibits no  distension. There is no tenderness.       Obese  Musculoskeletal: Normal range of motion. She exhibits no edema and no tenderness.       Bilateral pretibial pitting edema (L>R). No redness. DP pulses 2+ bilaterally  Neurological: She is alert. Coordination normal.  Skin: Skin is warm and dry. No rash noted.  Psychiatric: She has a normal mood and affect.    ED Course  Procedures (including critical care time)   Labs Reviewed  PROTIME-INR  BASIC METABOLIC  PANEL  CBC WITH DIFFERENTIAL   No results found. Results for orders placed during the hospital encounter of 08/05/12  PROTIME-INR      Component Value Range   Prothrombin Time 22.7 (*) 11.6 - 15.2 seconds   INR 2.10 (*) 0.00 - 1.49  BASIC METABOLIC PANEL      Component Value Range   Sodium 138  135 - 145 mEq/L   Potassium 4.0  3.5 - 5.1 mEq/L   Chloride 99  96 - 112 mEq/L   CO2 31  19 - 32 mEq/L   Glucose, Bld 95  70 - 99 mg/dL   BUN 16  6 - 23 mg/dL   Creatinine, Ser 1.61  0.50 - 1.10 mg/dL   Calcium 9.8  8.4 - 09.6 mg/dL   GFR calc non Af Amer 84 (*) >90 mL/min   GFR calc Af Amer >90  >90 mL/min  CBC WITH DIFFERENTIAL      Component Value Range   WBC 6.0  4.0 - 10.5 K/uL   RBC 3.75 (*) 3.87 - 5.11 MIL/uL   Hemoglobin 11.7 (*) 12.0 - 15.0 g/dL   HCT 04.5 (*) 40.9 - 81.1 %   MCV 93.1  78.0 - 100.0 fL   MCH 31.2  26.0 - 34.0 pg   MCHC 33.5  30.0 - 36.0 g/dL   RDW 91.4  78.2 - 95.6 %   Platelets 326  150 - 400 K/uL   Neutrophils Relative 66  43 - 77 %   Neutro Abs 3.9  1.7 - 7.7 K/uL   Lymphocytes Relative 23  12 - 46 %   Lymphs Abs 1.4  0.7 - 4.0 K/uL   Monocytes Relative 6  3 - 12 %   Monocytes Absolute 0.4  0.1 - 1.0 K/uL   Eosinophils Relative 5  0 - 5 %   Eosinophils Absolute 0.3  0.0 - 0.7 K/uL   Basophils Relative 0  0 - 1 %   Basophils Absolute 0.0  0.0 - 0.1 K/uL   Dg Chest 2 View  08/05/2012  *RADIOLOGY REPORT*  Clinical Data: Shortness of breath, hypertension, swelling of the legs  CHEST - 2 VIEW  Comparison: Chest x-ray of 05/31/2012  Findings: No active infiltrate or effusion is seen.  Cardiomegaly is stable.  No evidence of congestive heart failure is noted.  The bones are osteopenic and there are degenerative changes throughout the thoracic spine.  IMPRESSION: Stable cardiomegaly.  No active lung disease.   Original Report Authenticated By: Dwyane Dee, M.D.      No diagnosis found. Chest x-ray reviewed by me Venous Dopplers of lower extremities reported  as negative bilaterally  MDM  Peripheral edema felt secondary to fluid overload. Mild dyspnea is chronic and not felt to be secondary to pulmonary embolism, patient is therapeutic on warfarin. Also not felt secondary to acute coronary syndrome. Symptoms chronic and unchanged Plan prescription Lasix Patient to keep with Dr.Hasssan on 08/12/2012 Diagnosis peripheral edema  Doug Sou, MD 08/05/12 [phone]

## 2012-08-05 NOTE — ED Notes (Signed)
[id number]> Expected date:<BR> Expected time:<BR> Means of arrival:<BR> Comments:<BR> Leg pain/swelling, not taking fluid pill

## 2012-08-05 NOTE — ED Notes (Signed)
Pt c/o left leg swelling that began a month ago.  Pt reports that the leg swelling became worse on Friday.

## 2012-08-05 NOTE — ED Notes (Signed)
Pt voiced understanding of instructions given. States that she understands that taking PO lasix now as she requested will cause increased urination through the night.

## 2012-08-05 NOTE — Progress Notes (Signed)
VASCULAR LAB PRELIMINARY  PRELIMINARY  PRELIMINARY  PRELIMINARY  Bilateral lower extremity venous duplex completed.    Preliminary report:  Bilateral:  No evidence of DVT, superficial thrombosis, or Baker's Cyst.   Truly Stankiewicz, RVS 08/05/2012, 2:36 PM

## 2012-08-11 ENCOUNTER — Ambulatory Visit: Payer: Medicare Other

## 2012-08-11 ENCOUNTER — Other Ambulatory Visit: Payer: Medicare Other | Admitting: Lab

## 2012-08-14 ENCOUNTER — Ambulatory Visit: Payer: Medicare Other

## 2012-08-14 ENCOUNTER — Other Ambulatory Visit: Payer: Medicare Other | Admitting: Lab

## 2012-08-20 ENCOUNTER — Ambulatory Visit: Payer: Medicare Other

## 2012-08-20 ENCOUNTER — Other Ambulatory Visit: Payer: Medicare Other | Admitting: Lab

## 2012-08-28 ENCOUNTER — Ambulatory Visit (HOSPITAL_BASED_OUTPATIENT_CLINIC_OR_DEPARTMENT_OTHER): Payer: Medicare Other | Admitting: Pharmacist

## 2012-08-28 ENCOUNTER — Other Ambulatory Visit (HOSPITAL_BASED_OUTPATIENT_CLINIC_OR_DEPARTMENT_OTHER): Payer: Medicare Other | Admitting: Lab

## 2012-08-28 DIAGNOSIS — I2699 Other pulmonary embolism without acute cor pulmonale: Secondary | ICD-10-CM

## 2012-08-28 DIAGNOSIS — D6859 Other primary thrombophilia: Secondary | ICD-10-CM

## 2012-08-28 DIAGNOSIS — D6851 Activated protein C resistance: Secondary | ICD-10-CM

## 2012-08-28 DIAGNOSIS — Z7901 Long term (current) use of anticoagulants: Secondary | ICD-10-CM

## 2012-08-28 LAB — PROTIME-INR
INR: 2.6 (ref 2.00–3.50)
Protime: 31.2 Seconds — ABNORMAL HIGH (ref 10.6–13.4)

## 2012-08-28 NOTE — Patient Instructions (Addendum)
INR at goal, [place] Job! No changes Continue coumadin 5 mg daily Return in 1 month January 7th (Tuesday) lab at 11am, and coumadin clinic at 11:15am

## 2012-08-28 NOTE — Progress Notes (Signed)
INR at goal and stable on 5mg  daily. Will check PT/INR in 1 month. Pt stated no missed doses or bleeding/bruising episodes. Return 09/30/12 at 11am for lab and 1115 for coumadin clinic

## 2012-09-03 ENCOUNTER — Ambulatory Visit: Payer: Self-pay | Admitting: Pharmacist

## 2012-09-03 ENCOUNTER — Emergency Department (HOSPITAL_COMMUNITY)
Admission: EM | Admit: 2012-09-03 | Discharge: 2012-09-03 | Disposition: A | Discharge status: Home / Self Care | Payer: PRIVATE HEALTH INSURANCE | Attending: Emergency Medicine | Admitting: Emergency Medicine

## 2012-09-03 ENCOUNTER — Encounter (HOSPITAL_COMMUNITY): Payer: Self-pay

## 2012-09-03 DIAGNOSIS — I82409 Acute embolism and thrombosis of unspecified deep veins of unspecified lower extremity: Secondary | ICD-10-CM | POA: Insufficient documentation

## 2012-09-03 DIAGNOSIS — E785 Hyperlipidemia, unspecified: Secondary | ICD-10-CM | POA: Insufficient documentation

## 2012-09-03 DIAGNOSIS — D509 Iron deficiency anemia, unspecified: Secondary | ICD-10-CM | POA: Insufficient documentation

## 2012-09-03 DIAGNOSIS — I2699 Other pulmonary embolism without acute cor pulmonale: Secondary | ICD-10-CM

## 2012-09-03 DIAGNOSIS — J45909 Unspecified asthma, uncomplicated: Secondary | ICD-10-CM | POA: Insufficient documentation

## 2012-09-03 DIAGNOSIS — Z7901 Long term (current) use of anticoagulants: Secondary | ICD-10-CM | POA: Insufficient documentation

## 2012-09-03 DIAGNOSIS — F411 Generalized anxiety disorder: Secondary | ICD-10-CM | POA: Insufficient documentation

## 2012-09-03 DIAGNOSIS — D6851 Activated protein C resistance: Secondary | ICD-10-CM

## 2012-09-03 DIAGNOSIS — H409 Unspecified glaucoma: Secondary | ICD-10-CM | POA: Insufficient documentation

## 2012-09-03 DIAGNOSIS — G8929 Other chronic pain: Secondary | ICD-10-CM | POA: Insufficient documentation

## 2012-09-03 DIAGNOSIS — K219 Gastro-esophageal reflux disease without esophagitis: Secondary | ICD-10-CM | POA: Insufficient documentation

## 2012-09-03 DIAGNOSIS — F329 Major depressive disorder, single episode, unspecified: Secondary | ICD-10-CM | POA: Insufficient documentation

## 2012-09-03 DIAGNOSIS — Z79899 Other long term (current) drug therapy: Secondary | ICD-10-CM | POA: Insufficient documentation

## 2012-09-03 DIAGNOSIS — F209 Schizophrenia, unspecified: Secondary | ICD-10-CM | POA: Insufficient documentation

## 2012-09-03 DIAGNOSIS — M129 Arthropathy, unspecified: Secondary | ICD-10-CM | POA: Insufficient documentation

## 2012-09-03 DIAGNOSIS — M1990 Unspecified osteoarthritis, unspecified site: Secondary | ICD-10-CM

## 2012-09-03 DIAGNOSIS — F3289 Other specified depressive episodes: Secondary | ICD-10-CM | POA: Insufficient documentation

## 2012-09-03 DIAGNOSIS — I10 Essential (primary) hypertension: Secondary | ICD-10-CM | POA: Insufficient documentation

## 2012-09-03 MED ORDER — KETOROLAC TROMETHAMINE 60 MG/2ML IM SOLN
60.0000 mg | Freq: Once | INTRAMUSCULAR | Status: AC
  Administered 2012-09-03: 60 mg via INTRAMUSCULAR
  Filled 2012-09-03: qty 2

## 2012-09-03 NOTE — ED Provider Notes (Signed)
Medical screening examination/treatment/procedure(s) were performed by non-physician practitioner and as supervising physician I was immediately available for consultation/collaboration.  Aris Moman T Jada Fass, MD 09/03/12 2303 

## 2012-09-03 NOTE — ED Notes (Signed)
Pt presents with a complaint of generalized pain.  Pt states " My doctor told me that when I get to not being able to take it to come to the Emergency Room and get a shot."  Pt states that her pain is all over her body and not localized to anyone area.  Pt states" I have a history of arthritis."

## 2012-09-03 NOTE — ED Notes (Signed)
[id number]> Expected date:<BR> Expected time:<BR> Means of arrival:<BR> Comments:<BR> *Triage* M80- 71yoF generalized pain

## 2012-09-03 NOTE — ED Provider Notes (Signed)
History     CSN: [id number]  Arrival date & time 09/03/12  1604   First MD Initiated Contact with Patient 09/03/12 1651      Chief Complaint  Patient presents with  . Pain    (Consider location/radiation/quality/duration/timing/severity/associated sxs/prior treatment) HPI Comments: 71 year old female with a history of arthritis presents to the emergency department complaining of generalized pain from an "arthritis flareup". States her doctor told her that when she cannot get her pain under control with Vicodin she needs to go to the emergency department for a shot. Patient was recently diagnosed with psoriasis and says that her arthritis is more likely to flare up now. She tried calling her doctor today, however she was able to reach him for an appointment. Denies fever, chills or swelling.  The history is provided by the patient.    Past Medical History  Diagnosis Date  . Depression   . Schizophrenia   . Arthritis   . Hyperlipidemia   . Hypertension   . Chronic pain   . 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 DVT (deep venous thrombosis)     per 01/17/10 d/c summary- "remote hx of dvt"?  . Iron deficiency anemia   . Chronic headaches   . Sleep apnea   . Glaucoma(365)   . Hemorrhoids   . Asthma   . Anxiety   . GERD (gastroesophageal reflux disease)   . Psoriasis   . Psoriasis 04/29/2012    New onset evaluated by Dr Marylou Flesher, Dermatology, [facility] 6/13  Rx 0.1% Tacrolimus ointment    Past Surgical History  Procedure Date  . Spinal fusion     2004  . Joint replacement right knee 11/2008  . Tubal ligation year 37  . Carpal tunnel release     bilateral     Family History  Problem Relation Age of Onset  . Diabetes Sister   . Colon cancer Brother 40    History  Substance Use Topics  . Smoking status: Never Smoker   . Smokeless tobacco: Never Used  . Alcohol Use: No    OB History    Grav Para Term Preterm Abortions TAB SAB Ect  Mult Living                  Review of Systems  Constitutional: Negative for fever and chills.  Musculoskeletal: Positive for arthralgias. Negative for joint swelling.  Skin: Negative for color change.  All other systems reviewed and are negative.    Allergies  Adhesive  Home Medications   Current Outpatient Rx  Name  Route  Sig  Dispense  Refill  . ALBUTEROL SULFATE HFA 108 (90 BASE) MCG/ACT IN AERS   Inhalation   Inhale 2 puffs into the lungs 2 (two) times daily as needed. For shortness of breath and wheezing         . ESOMEPRAZOLE MAGNESIUM 40 MG PO CPDR   Oral   Take 80 mg by mouth daily.          . FUROSEMIDE 40 MG PO TABS   Oral   Take 40 mg by mouth daily as needed. edema         . HYDROCHLOROTHIAZIDE 25 MG PO TABS   Oral   Take 25 mg by mouth daily. For high blood pressure          . MECLIZINE HCL 50 MG PO TABS   Oral   Take 25 mg by mouth  3 (three) times daily as needed. vertigo         . WARFARIN SODIUM 5 MG PO TABS   Oral   Take 5 mg by mouth daily.         . ATENOLOL 100 MG PO TABS   Oral   Take 100 mg by mouth daily.         Marland Kitchen CAMPHOR-MENTHOL 0.5-0.5 % EX LOTN   Topical   Apply 1 application topically 2 (two) times daily.         Marland Kitchen CLOBETASOL PROPIONATE 0.05 % EX OINT   Topical   Apply 1 application topically 2 (two) times daily.         Marland Kitchen CLONAZEPAM 0.5 MG PO TABS   Oral   Take 1 mg by mouth 2 (two) times daily as needed. For anxiety         . FERROUS SULFATE 325 (65 FE) MG PO TABS   Oral   Take 325 mg by mouth 2 (two) times daily.          Marland Kitchen FLUOXETINE HCL 20 MG PO CAPS   Oral   Take 60 mg by mouth daily.         Marland Kitchen FLUTICASONE PROPIONATE  HFA 110 MCG/ACT IN AERO   Inhalation   Inhale 2 puffs into the lungs 2 (two) times daily.         . FUROSEMIDE 40 MG PO TABS   Oral   Take 40 mg by mouth daily as needed. For swelling         . HYDROCODONE-ACETAMINOPHEN 5-325 MG PO TABS   Oral   Take 1 tablet by  mouth Every 6 hours as needed.         Marland Kitchen HYDROXYZINE HCL 25 MG PO TABS   Oral   Take 25 mg by mouth 3 (three) times daily as needed. itching         . ARTIFICIAL TEARS OP   Ophthalmic   Apply 1 drop to eye 4 (four) times daily. Dextran 70/hypromellose         . PRESCRIPTION MEDICATION   Topical   Apply 1 application topically 2 (two) times daily. Cream compounded at [facility]  riamcinolone 0.1%, silver sulfadiazine 1%   3-1         . TACROLIMUS 0.1 % EX OINT   Topical   Apply 1 application topically 2 (two) times daily.         . TRIAMCINOLONE ACETONIDE 0.1 % EX CREA   Topical   Apply 1 application topically 2 (two) times daily.           BP 169/81  Pulse 67  Temp 98.5 F (36.9 C) (Oral)  Resp 16  Ht 5\' 5"  (1.651 m)  Wt 240 lb (108.863 kg)  BMI 39.94 kg/m2  SpO2 98%  LMP 11/22/1996  Physical Exam  Constitutional: She is oriented to person, place, and time. She appears well-developed. No distress.       Obese  HENT:  Head: Normocephalic and atraumatic.  Eyes: Conjunctivae normal and EOM are normal. Pupils are equal, round, and reactive to light. No scleral icterus.  Neck: Normal range of motion. Neck supple.  Cardiovascular: Normal rate, regular rhythm, normal heart sounds and intact distal pulses.        No extremity edema.  Pulmonary/Chest: Effort normal and breath sounds normal.  Abdominal: Soft. Bowel sounds are normal. There is no tenderness.  Musculoskeletal: Normal range of motion.  She exhibits no edema.       Generalized mild tenderness to joints.  Neurological: She is alert and oriented to person, place, and time.  Skin: Skin is warm and dry. No erythema.  Psychiatric: She has a normal mood and affect. Her behavior is normal.    ED Course  Procedures (including critical care time)  Labs Reviewed - No data to display No results found.   1. Arthritis       MDM  71 y/o female with arthritis presenting with "flare up". She is in NAD.  Patient went into a long story about her history of arthritis being diagnosed many years ago. Redirection during exam needed to be obtained. Ambulating without difficulty and able to move all extremities. No edema or erythema. IM toradol given. Stable for discharge. Advised PCP follow up.  Trevor Mace, PA-C 09/03/12 1712

## 2012-09-03 NOTE — Progress Notes (Signed)
Pt called in distress that she is in a lot of arthritis and shingles pain, and needs to take something for the pain.   She described suicidal ideations because of the pain, but would not act on them because she needs to take care of her adult dependent son.  Pt also complains of severe fatigue and is still craving ice chips.  "Ice chips are the only thing keeping me alive!"  She wonders if she might need IV iron versus using the "red vitamins" that Dr. Cyndie Chime gave her.  Advised patient that in her medlist, she can utilize the Vicodin to handle severe pain.  Pt states that she does not want to use her supply of the Vicodin because "it doesn't last long enough" and if she does use it, she will use it all and not be able to fill it again until the end of the month.    Advised patient that if her pain is this severe, she needs to see her medical doctor or go to the emergency room.  Pt voiced frustrations with both services.  Encouraged her to utilize them anyway since our Coumadin Clinic service is unable to prescribe the medications she is seeking.  Pt communicated understanding.  Will forward her concerns about IV iron to Dr. Cyndie Chime.  She is scheduled for a repeat INR in January.

## 2012-09-03 NOTE — ED Notes (Signed)
Pt in from home by ems. C/o generalized pain x1 month. Usually seen by PCP but could not be seen today.

## 2012-09-15 ENCOUNTER — Emergency Department (HOSPITAL_COMMUNITY)
Admission: EM | Admit: 2012-09-15 | Discharge: 2012-09-15 | Disposition: A | Discharge status: Home / Self Care | Payer: PRIVATE HEALTH INSURANCE | Attending: Emergency Medicine | Admitting: Emergency Medicine

## 2012-09-15 ENCOUNTER — Encounter (HOSPITAL_COMMUNITY): Payer: Self-pay | Admitting: *Deleted

## 2012-09-15 DIAGNOSIS — Z8669 Personal history of other diseases of the nervous system and sense organs: Secondary | ICD-10-CM | POA: Insufficient documentation

## 2012-09-15 DIAGNOSIS — G8929 Other chronic pain: Secondary | ICD-10-CM | POA: Insufficient documentation

## 2012-09-15 DIAGNOSIS — L409 Psoriasis, unspecified: Secondary | ICD-10-CM

## 2012-09-15 DIAGNOSIS — Z7901 Long term (current) use of anticoagulants: Secondary | ICD-10-CM | POA: Insufficient documentation

## 2012-09-15 DIAGNOSIS — E785 Hyperlipidemia, unspecified: Secondary | ICD-10-CM | POA: Insufficient documentation

## 2012-09-15 DIAGNOSIS — F3289 Other specified depressive episodes: Secondary | ICD-10-CM | POA: Insufficient documentation

## 2012-09-15 DIAGNOSIS — Z86711 Personal history of pulmonary embolism: Secondary | ICD-10-CM | POA: Insufficient documentation

## 2012-09-15 DIAGNOSIS — Z79899 Other long term (current) drug therapy: Secondary | ICD-10-CM | POA: Insufficient documentation

## 2012-09-15 DIAGNOSIS — K219 Gastro-esophageal reflux disease without esophagitis: Secondary | ICD-10-CM | POA: Insufficient documentation

## 2012-09-15 DIAGNOSIS — F329 Major depressive disorder, single episode, unspecified: Secondary | ICD-10-CM | POA: Insufficient documentation

## 2012-09-15 DIAGNOSIS — F209 Schizophrenia, unspecified: Secondary | ICD-10-CM | POA: Insufficient documentation

## 2012-09-15 DIAGNOSIS — M1990 Unspecified osteoarthritis, unspecified site: Secondary | ICD-10-CM

## 2012-09-15 DIAGNOSIS — L408 Other psoriasis: Secondary | ICD-10-CM | POA: Insufficient documentation

## 2012-09-15 DIAGNOSIS — D500 Iron deficiency anemia secondary to blood loss (chronic): Secondary | ICD-10-CM | POA: Insufficient documentation

## 2012-09-15 DIAGNOSIS — J45909 Unspecified asthma, uncomplicated: Secondary | ICD-10-CM | POA: Insufficient documentation

## 2012-09-15 DIAGNOSIS — Z86718 Personal history of other venous thrombosis and embolism: Secondary | ICD-10-CM | POA: Insufficient documentation

## 2012-09-15 DIAGNOSIS — M129 Arthropathy, unspecified: Secondary | ICD-10-CM | POA: Insufficient documentation

## 2012-09-15 DIAGNOSIS — Z8719 Personal history of other diseases of the digestive system: Secondary | ICD-10-CM | POA: Insufficient documentation

## 2012-09-15 DIAGNOSIS — F411 Generalized anxiety disorder: Secondary | ICD-10-CM | POA: Insufficient documentation

## 2012-09-15 DIAGNOSIS — I10 Essential (primary) hypertension: Secondary | ICD-10-CM | POA: Insufficient documentation

## 2012-09-15 MED ORDER — HYDROCODONE-ACETAMINOPHEN 5-325 MG PO TABS
1.0000 | ORAL_TABLET | Freq: Once | ORAL | Status: AC
  Administered 2012-09-15: 1 via ORAL
  Filled 2012-09-15: qty 1

## 2012-09-15 MED ORDER — ONDANSETRON HCL 4 MG/2ML IJ SOLN
4.0000 mg | Freq: Once | INTRAMUSCULAR | Status: AC
  Administered 2012-09-15: 4 mg via INTRAMUSCULAR
  Filled 2012-09-15: qty 2

## 2012-09-15 MED ORDER — ONDANSETRON 4 MG PO TBDP: 4.0000 mg | ORAL_TABLET | Freq: Once | ORAL | Status: DC

## 2012-09-15 MED ORDER — TRAMADOL HCL 50 MG PO TABS: 50.0000 mg | ORAL_TABLET | Freq: Four times a day (QID) | ORAL | Status: DC | PRN

## 2012-09-15 NOTE — ED Provider Notes (Signed)
History     CSN: [id number]  Arrival date & time 09/15/12  1047   First MD Initiated Contact with Patient 09/15/12 1144      Chief Complaint  Patient presents with  . Psoriasis    (Consider location/radiation/quality/duration/timing/severity/associated sxs/prior treatment) HPI  Pt to the ER with complaints of pain due to arthritis and psoriasis. She has been seen multiple times in the ER for similar complaints. She is requesting a shot of Morphine  and Zofran in a shot. She said that she had them once and they really worked for a long time. She has been set  up with Dermatology at [facility] and has an appointment coming up soon. She says she tried to  see an outpatient doctor today but they were only taking 50 people. Therefore she came here. nad vss   Past Medical History  Diagnosis Date  . Depression   . Schizophrenia   . Arthritis   . Hyperlipidemia   . Hypertension   . Chronic pain   . 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 DVT (deep venous thrombosis)     per 01/17/10 d/c summary- "remote hx of dvt"?  . Iron deficiency anemia   . Chronic headaches   . Sleep apnea   . Glaucoma(365)   . Hemorrhoids   . Asthma   . Anxiety   . GERD (gastroesophageal reflux disease)   . Psoriasis   . Psoriasis 04/29/2012    New onset evaluated by Dr Marylou Flesher, Dermatology, [facility] 6/13  Rx 0.1% Tacrolimus ointment    Past Surgical History  Procedure Date  . Spinal fusion     2004  . Joint replacement right knee 11/2008  . Tubal ligation year 43  . Carpal tunnel release     bilateral     Family History  Problem Relation Age of Onset  . Diabetes Sister   . Colon cancer Brother 40    History  Substance Use Topics  . Smoking status: Never Smoker   . Smokeless tobacco: Never Used  . Alcohol Use: No    OB History    Grav Para Term Preterm Abortions TAB SAB Ect Mult Living                  Review of Systems  Musculoskeletal:  Positive for arthralgias.  All other systems reviewed and are negative.    Allergies  Adhesive  Home Medications   Current Outpatient Rx  Name  Route  Sig  Dispense  Refill  . ALBUTEROL SULFATE HFA 108 (90 BASE) MCG/ACT IN AERS   Inhalation   Inhale 2 puffs into the lungs 2 (two) times daily as needed. For shortness of breath and wheezing         . ATENOLOL 100 MG PO TABS   Oral   Take 100 mg by mouth every morning.          Marland Kitchen CAMPHOR-MENTHOL 0.5-0.5 % EX LOTN   Topical   Apply 1 application topically 2 (two) times daily.         Marland Kitchen CLONAZEPAM 0.5 MG PO TABS   Oral   Take 1 mg by mouth 2 (two) times daily. For anxiety         . ESOMEPRAZOLE MAGNESIUM 40 MG PO CPDR   Oral   Take 40 mg by mouth daily before breakfast.          . FERROUS SULFATE 325 (65 FE)  MG PO TABS   Oral   Take 325 mg by mouth daily with breakfast.          . FLUOXETINE HCL 20 MG PO CAPS   Oral   Take 60 mg by mouth every morning.          Marland Kitchen FLUTICASONE PROPIONATE  HFA 110 MCG/ACT IN AERO   Inhalation   Inhale 2 puffs into the lungs 2 (two) times daily.         . FUROSEMIDE 40 MG PO TABS   Oral   Take 40 mg by mouth daily as needed. edema         . HYDROCHLOROTHIAZIDE 25 MG PO TABS   Oral   Take 25 mg by mouth daily. For high blood pressure          . HYDROCODONE-ACETAMINOPHEN 5-325 MG PO TABS   Oral   Take 2 tablets by mouth Every 6 hours as needed. pain         . HYDROXYZINE HCL 25 MG PO TABS   Oral   Take 25 mg by mouth every 6 (six) hours as needed. itching         . MECLIZINE HCL 50 MG PO TABS   Oral   Take 25 mg by mouth 3 (three) times daily as needed. vertigo         . PRESCRIPTION MEDICATION   Topical   Apply 1 application topically 2 (two) times daily. Cream compounded at [facility]  riamcinolone 0.1%, silver sulfadiazine 1%   3-1   Applies to entire body         . TACROLIMUS 0.1 % EX OINT   Topical   Apply 1 application topically 2 (two)  times daily as needed. psoriasis         . TRIAMCINOLONE ACETONIDE 0.1 % EX CREA   Topical   Apply 1 application topically 2 (two) times daily as needed. Entire body psoriasis         . WARFARIN SODIUM 5 MG PO TABS   Oral   Take 5 mg by mouth daily.           BP 138/84  Pulse 80  Temp 98.5 F (36.9 C) (Oral)  Resp 14  SpO2 96%  LMP 11/22/1996  Physical Exam  Constitutional: She appears well-developed and well-nourished.  HENT:  Head: Normocephalic and atraumatic.  Eyes: Pupils are equal, round, and reactive to light.  Pulmonary/Chest: Effort normal.  Abdominal: Soft.  Musculoskeletal:       Right knee: She exhibits normal range of motion, no swelling, no effusion, no ecchymosis, no deformity and no laceration. no tenderness found.       Legs:      + varicose veins    ED Course  Procedures (including critical care time)  Labs Reviewed - No data to display No results found.   1. Psoriasis   2. Arthritis       MDM  Pt given IM Zofran and 1 Percocet tab.  Dc with [facility]. To follow-up with [facility] for further management.  Pt has been advised of the symptoms that warrant their return to the ED. Patient has voiced understanding and has agreed to follow-up with the PCP or specialist.         Dorthula Matas, PA 09/15/12 1231

## 2012-09-15 NOTE — ED Notes (Signed)
Pt c/o generalized pain x3 weeks. Has been seen here multiple times for same. Hx psoriosis. Sts she has been told if her Dr can't see her to come to ED to get a shot of morphine, she sts "it lasts longer."

## 2012-09-17 NOTE — ED Provider Notes (Signed)
Medical screening examination/treatment/procedure(s) were performed by non-physician practitioner and as supervising physician I was immediately available for consultation/collaboration.  Kristianne Albin R. Deven Furia, MD 09/17/12 1538 

## 2012-09-30 ENCOUNTER — Ambulatory Visit: Payer: Medicare Other

## 2012-09-30 ENCOUNTER — Other Ambulatory Visit: Payer: Medicare Other | Admitting: Lab

## 2012-10-02 ENCOUNTER — Emergency Department (HOSPITAL_COMMUNITY): Payer: PRIVATE HEALTH INSURANCE

## 2012-10-02 ENCOUNTER — Emergency Department (HOSPITAL_COMMUNITY)
Admission: EM | Admit: 2012-10-02 | Discharge: 2012-10-03 | Disposition: A | Discharge status: Home / Self Care | Payer: PRIVATE HEALTH INSURANCE | Attending: Emergency Medicine | Admitting: Emergency Medicine

## 2012-10-02 ENCOUNTER — Encounter (HOSPITAL_COMMUNITY): Payer: Self-pay

## 2012-10-02 DIAGNOSIS — I83813 Varicose veins of bilateral lower extremities with pain: Secondary | ICD-10-CM

## 2012-10-02 DIAGNOSIS — Z87898 Personal history of other specified conditions: Secondary | ICD-10-CM | POA: Insufficient documentation

## 2012-10-02 DIAGNOSIS — E785 Hyperlipidemia, unspecified: Secondary | ICD-10-CM | POA: Insufficient documentation

## 2012-10-02 DIAGNOSIS — E669 Obesity, unspecified: Secondary | ICD-10-CM | POA: Insufficient documentation

## 2012-10-02 DIAGNOSIS — I10 Essential (primary) hypertension: Secondary | ICD-10-CM | POA: Insufficient documentation

## 2012-10-02 DIAGNOSIS — J45909 Unspecified asthma, uncomplicated: Secondary | ICD-10-CM | POA: Insufficient documentation

## 2012-10-02 DIAGNOSIS — Z8679 Personal history of other diseases of the circulatory system: Secondary | ICD-10-CM | POA: Insufficient documentation

## 2012-10-02 DIAGNOSIS — I2699 Other pulmonary embolism without acute cor pulmonale: Secondary | ICD-10-CM | POA: Insufficient documentation

## 2012-10-02 DIAGNOSIS — Z79899 Other long term (current) drug therapy: Secondary | ICD-10-CM | POA: Insufficient documentation

## 2012-10-02 DIAGNOSIS — K219 Gastro-esophageal reflux disease without esophagitis: Secondary | ICD-10-CM | POA: Insufficient documentation

## 2012-10-02 DIAGNOSIS — Z8739 Personal history of other diseases of the musculoskeletal system and connective tissue: Secondary | ICD-10-CM | POA: Insufficient documentation

## 2012-10-02 DIAGNOSIS — Z872 Personal history of diseases of the skin and subcutaneous tissue: Secondary | ICD-10-CM | POA: Insufficient documentation

## 2012-10-02 DIAGNOSIS — F1490 Cocaine use, unspecified, uncomplicated: Secondary | ICD-10-CM | POA: Diagnosis present

## 2012-10-02 DIAGNOSIS — D509 Iron deficiency anemia, unspecified: Secondary | ICD-10-CM | POA: Insufficient documentation

## 2012-10-02 DIAGNOSIS — F209 Schizophrenia, unspecified: Secondary | ICD-10-CM | POA: Insufficient documentation

## 2012-10-02 DIAGNOSIS — F3289 Other specified depressive episodes: Secondary | ICD-10-CM | POA: Insufficient documentation

## 2012-10-02 DIAGNOSIS — Z86718 Personal history of other venous thrombosis and embolism: Secondary | ICD-10-CM | POA: Insufficient documentation

## 2012-10-02 DIAGNOSIS — F329 Major depressive disorder, single episode, unspecified: Secondary | ICD-10-CM

## 2012-10-02 DIAGNOSIS — Z8659 Personal history of other mental and behavioral disorders: Secondary | ICD-10-CM | POA: Insufficient documentation

## 2012-10-02 DIAGNOSIS — G4733 Obstructive sleep apnea (adult) (pediatric): Secondary | ICD-10-CM | POA: Insufficient documentation

## 2012-10-02 DIAGNOSIS — G8929 Other chronic pain: Secondary | ICD-10-CM | POA: Insufficient documentation

## 2012-10-02 DIAGNOSIS — Z7901 Long term (current) use of anticoagulants: Secondary | ICD-10-CM | POA: Insufficient documentation

## 2012-10-02 LAB — COMPREHENSIVE METABOLIC PANEL
AST: 24 U/L (ref 0–37)
Albumin: 3.5 g/dL (ref 3.5–5.2)
Calcium: 9.3 mg/dL (ref 8.4–10.5)
Creatinine, Ser: 0.85 mg/dL (ref 0.50–1.10)

## 2012-10-02 LAB — RAPID URINE DRUG SCREEN, HOSP PERFORMED
Benzodiazepines: NOT DETECTED
Cocaine: NOT DETECTED

## 2012-10-02 LAB — CBC
MCH: 31.8 pg (ref 26.0–34.0)
MCHC: 33.5 g/dL (ref 30.0–36.0)
MCV: 94.8 fL (ref 78.0–100.0)
Platelets: 241 10*3/uL (ref 150–400)
RDW: 15.2 % (ref 11.5–15.5)

## 2012-10-02 LAB — SALICYLATE LEVEL: Salicylate Lvl: <2 mg/dL — ABNORMAL LOW (ref 2.8–20.0)

## 2012-10-02 LAB — PROTIME-INR: INR: 2.35 — ABNORMAL HIGH (ref 0.00–1.49)

## 2012-10-02 LAB — ACETAMINOPHEN LEVEL: Acetaminophen (Tylenol), Serum: 19.8 ug/mL (ref 10–30)

## 2012-10-02 MED ORDER — FERROUS SULFATE 325 (65 FE) MG PO TABS
325.0000 mg | ORAL_TABLET | Freq: Every day | ORAL | Status: DC
  Administered 2012-10-03: 325 mg via ORAL
  Filled 2012-10-02 (×2): qty 1

## 2012-10-02 MED ORDER — PANTOPRAZOLE SODIUM 40 MG PO TBEC
80.0000 mg | DELAYED_RELEASE_TABLET | Freq: Every day | ORAL | Status: DC
  Administered 2012-10-03: 80 mg via ORAL
  Filled 2012-10-02 (×2): qty 1

## 2012-10-02 MED ORDER — WARFARIN - PHYSICIAN DOSING INPATIENT
Freq: Every day | Status: DC
  Administered 2012-10-03: 18:00:00

## 2012-10-02 MED ORDER — ATENOLOL 100 MG PO TABS
100.0000 mg | ORAL_TABLET | Freq: Every day | ORAL | Status: DC
  Administered 2012-10-03: 100 mg via ORAL
  Filled 2012-10-02: qty 1

## 2012-10-02 MED ORDER — FLUTICASONE PROPIONATE HFA 220 MCG/ACT IN AERO
1.0000 | INHALATION_SPRAY | Freq: Two times a day (BID) | RESPIRATORY_TRACT | Status: DC
  Administered 2012-10-03: 1 via RESPIRATORY_TRACT
  Filled 2012-10-02: qty 12

## 2012-10-02 MED ORDER — CLONAZEPAM 0.5 MG PO TABS
0.5000 mg | ORAL_TABLET | Freq: Three times a day (TID) | ORAL | Status: DC | PRN
  Administered 2012-10-03: 0.5 mg via ORAL
  Filled 2012-10-02: qty 1

## 2012-10-02 MED ORDER — TRAMADOL HCL 50 MG PO TABS
50.0000 mg | ORAL_TABLET | Freq: Four times a day (QID) | ORAL | Status: DC | PRN
  Administered 2012-10-03: 50 mg via ORAL
  Filled 2012-10-02: qty 1

## 2012-10-02 MED ORDER — HYDROCHLOROTHIAZIDE 25 MG PO TABS
25.0000 mg | ORAL_TABLET | Freq: Every day | ORAL | Status: DC
  Administered 2012-10-03: 25 mg via ORAL
  Filled 2012-10-02: qty 1

## 2012-10-02 MED ORDER — GABAPENTIN 100 MG PO CAPS
100.0000 mg | ORAL_CAPSULE | Freq: Three times a day (TID) | ORAL | Status: DC
  Administered 2012-10-02 – 2012-10-03 (×3): 100 mg via ORAL
  Filled 2012-10-02 (×5): qty 1

## 2012-10-02 MED ORDER — FLUOXETINE HCL 20 MG PO CAPS
60.0000 mg | ORAL_CAPSULE | Freq: Every morning | ORAL | Status: DC
  Administered 2012-10-03: 60 mg via ORAL
  Filled 2012-10-02: qty 3

## 2012-10-02 MED ORDER — ALBUTEROL SULFATE HFA 108 (90 BASE) MCG/ACT IN AERS
2.0000 | INHALATION_SPRAY | Freq: Two times a day (BID) | RESPIRATORY_TRACT | Status: DC | PRN
  Administered 2012-10-03: 2 via RESPIRATORY_TRACT
  Filled 2012-10-02: qty 6.7

## 2012-10-02 MED ORDER — ALUM & MAG HYDROXIDE-SIMETH 200-200-20 MG/5ML PO SUSP: 30.0000 mL | ORAL | Status: DC | PRN

## 2012-10-02 MED ORDER — ONDANSETRON HCL 4 MG PO TABS: 4.0000 mg | ORAL_TABLET | Freq: Three times a day (TID) | ORAL | Status: DC | PRN

## 2012-10-02 MED ORDER — WARFARIN SODIUM 5 MG PO TABS
5.0000 mg | ORAL_TABLET | Freq: Every day | ORAL | Status: DC
  Administered 2012-10-03: 5 mg via ORAL
  Filled 2012-10-02: qty 1

## 2012-10-02 NOTE — ED Provider Notes (Signed)
History     CSN: [id number]  Arrival date & time 10/02/12  1427   First MD Initiated Contact with Patient 10/02/12 1501      Chief Complaint  Patient presents with  . Medical Clearance    (Consider location/radiation/quality/duration/timing/severity/associated sxs/prior treatment) The history is provided by the patient.   patient was sent in under involuntary commitment by [facility] after suicidal thoughts and possible homicidal thoughts. She reportedly took 10 of her pain pills throughout the day. His pain goes are her normal medication. She is social issues with her husband. She states she's been trying to get away from them for 17 years. No chest pain. No trouble breathing. No confusion. Lightheadedness or dizziness. She has chronic back pain. She states she has been taking her medications. She also has a son with multiple medical problems.  Past Medical History  Diagnosis Date  . Depression   . Schizophrenia   . Arthritis   . Hyperlipidemia   . Hypertension   . Chronic pain   . 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 DVT (deep venous thrombosis)     per 01/17/10 d/c summary- "remote hx of dvt"?  . Iron deficiency anemia   . Chronic headaches   . Sleep apnea   . Glaucoma(365)   . Hemorrhoids   . Asthma   . Anxiety   . GERD (gastroesophageal reflux disease)   . Psoriasis   . Psoriasis 04/29/2012    New onset evaluated by Dr Marylou Flesher, Dermatology, [facility] 6/13  Rx 0.1% Tacrolimus ointment    Past Surgical History  Procedure Date  . Spinal fusion     2004  . Joint replacement right knee 11/2008  . Tubal ligation year 12  . Carpal tunnel release     bilateral     Family History  Problem Relation Age of Onset  . Diabetes Sister   . Colon cancer Brother 40    History  Substance Use Topics  . Smoking status: Never Smoker   . Smokeless tobacco: Never Used  . Alcohol Use: No    OB History    Grav Para Term Preterm Abortions TAB  SAB Ect Mult Living                  Review of Systems  Constitutional: Negative for activity change and appetite change.  HENT: Negative for neck stiffness.   Eyes: Negative for pain.  Respiratory: Negative for chest tightness and shortness of breath.   Cardiovascular: Negative for chest pain and leg swelling.  Gastrointestinal: Negative for nausea, vomiting, abdominal pain and diarrhea.  Genitourinary: Negative for flank pain.  Musculoskeletal: Positive for back pain.  Skin: Negative for rash.  Neurological: Negative for weakness, numbness and headaches.  Psychiatric/Behavioral: Negative for behavioral problems.    Allergies  Adhesive  Home Medications   Current Outpatient Rx  Name  Route  Sig  Dispense  Refill  . ATENOLOL 100 MG PO TABS   Oral   Take 100 mg by mouth daily.          Marland Kitchen CLONAZEPAM 0.5 MG PO TABS   Oral   Take 0.5 mg by mouth 3 (three) times daily as needed. For anxiety         . ESOMEPRAZOLE MAGNESIUM 40 MG PO CPDR   Oral   Take 40 mg by mouth daily before breakfast.          . FLUOXETINE HCL 20 MG  PO CAPS   Oral   Take 60 mg by mouth every morning.          Marland Kitchen FLUTICASONE PROPIONATE  HFA 220 MCG/ACT IN AERO   Inhalation   Inhale 1 puff into the lungs 2 (two) times daily.         . FUROSEMIDE 40 MG PO TABS   Oral   Take 40 mg by mouth daily as needed. edema         . GABAPENTIN 100 MG PO CAPS   Oral   Take 100 mg by mouth 3 (three) times daily.         Marland Kitchen HYDROCHLOROTHIAZIDE 25 MG PO TABS   Oral   Take 25 mg by mouth daily. For high blood pressure          . HYDROCODONE-ACETAMINOPHEN 5-325 MG PO TABS   Oral   Take 1 tablet by mouth every 6 (six) hours as needed. pain         . PRESCRIPTION MEDICATION   Topical   Apply 1 application topically 2 (two) times daily. Cream compounded at [facility]  riamcinolone 0.1%, silver sulfadiazine 1%   3-1   Applies to entire body         . TACROLIMUS 0.1 % EX OINT   Topical    Apply 1 application topically 2 (two) times daily as needed. psoriasis         . TRIAMCINOLONE ACETONIDE 0.1 % EX CREA   Topical   Apply 1 application topically 2 (two) times daily as needed. Entire body psoriasis         . WARFARIN SODIUM 5 MG PO TABS   Oral   Take 5 mg by mouth daily.         . ALBUTEROL SULFATE HFA 108 (90 BASE) MCG/ACT IN AERS   Inhalation   Inhale 2 puffs into the lungs 2 (two) times daily as needed. For shortness of breath and wheezing         . CAMPHOR-MENTHOL 0.5-0.5 % EX LOTN   Topical   Apply 1 application topically 2 (two) times daily.         Marland Kitchen FERROUS SULFATE 325 (65 FE) MG PO TABS   Oral   Take 325 mg by mouth daily with breakfast.          . HYDROXYZINE HCL 25 MG PO TABS   Oral   Take 25 mg by mouth every 6 (six) hours as needed. itching         . MECLIZINE HCL 50 MG PO TABS   Oral   Take 25 mg by mouth 3 (three) times daily as needed. vertigo         . TRAMADOL HCL 50 MG PO TABS   Oral   Take 1 tablet (50 mg total) by mouth every 6 (six) hours as needed for pain.   15 tablet   0     BP 120/59  Pulse 64  Temp 98.3 F (36.8 C) (Oral)  Resp 16  SpO2 96%  LMP 11/22/1996  Physical Exam  Nursing note and vitals reviewed. Constitutional: She is oriented to person, place, and time. She appears well-developed and well-nourished.  HENT:  Head: Normocephalic and atraumatic.  Eyes: EOM are normal. Pupils are equal, round, and reactive to light.  Neck: Normal range of motion. Neck supple.  Cardiovascular: Normal rate, regular rhythm and normal heart sounds.   No murmur heard. Pulmonary/Chest: Effort normal and breath sounds  normal. No respiratory distress. She has no wheezes. She has no rales.  Abdominal: Soft. Bowel sounds are normal. She exhibits no distension. There is no tenderness. There is no rebound and no guarding.  Musculoskeletal: Normal range of motion.  Neurological: She is alert and oriented to person, place,  and time. No cranial nerve deficit.  Skin: Skin is warm and dry.  Psychiatric: Her speech is normal.       Patient appears depressed and tearful    ED Course  Procedures (including critical care time)  Labs Reviewed  CBC - Abnormal; Notable for the following:    RBC 3.68 (*)     Hemoglobin 11.7 (*)     HCT 34.9 (*)     All other components within normal limits  COMPREHENSIVE METABOLIC PANEL - Abnormal; Notable for the following:    Glucose, Bld 101 (*)     Total Bilirubin 0.2 (*)     GFR calc non Af Amer 68 (*)     GFR calc Af Amer 79 (*)     All other components within normal limits  ETHANOL - Abnormal; Notable for the following:    Alcohol, Ethyl (B) 23 (*)     All other components within normal limits  SALICYLATE LEVEL - Abnormal; Notable for the following:    Salicylate Lvl <2.0 (*)     All other components within normal limits  URINE RAPID DRUG SCREEN (HOSP PERFORMED) - Abnormal; Notable for the following:    Opiates POSITIVE (*)     All other components within normal limits  PROTIME-INR - Abnormal; Notable for the following:    Prothrombin Time 24.7 (*)     INR 2.35 (*)     All other components within normal limits  ACETAMINOPHEN LEVEL  GLUCOSE, CAPILLARY   Dg Chest 2 View  10/02/2012  *RADIOLOGY REPORT*  Clinical Data: Weakness.  Medical clearance.  CHEST - 2 VIEW  Comparison: 08/05/2012.  Findings: Mild cardiomegaly is present.  Lung volumes slightly low. Stable prominence of the right paratracheal vascular structures. Monitoring leads are projected over the chest.  No airspace disease.  No effusion.  Suboptimal lateral view because the arms are not raised over head.  IMPRESSION: Cardiomegaly without failure.   Original Report Authenticated By: Andreas Newport, M.D.      1. Depression   2. Chronic anticoagulation   3. PULMONARY EMBOLISM   4. Iron deficiency anemia, unspecified   5. HYPERLIPIDEMIA   6. Obesity, unspecified   7. Essential hypertension, benign   8.  ASTHMA, PERSISTENT   9. Varicose veins of both lower extremities with pain   10. GERD      Date: 10/02/2012  Rate: 55  Rhythm: sinus bradycardia  QRS Axis: normal  Intervals: normal  ST/T Wave abnormalities: normal  Conduction Disutrbances:none  Narrative Interpretation:   Old EKG Reviewed: unchanged    MDM  Patient with depression over social issues. She appears to medically cleared at this time. She will be seen by ACT team. She was sent in by [facility], but cannot go back there.        [name] [name]. Rubin Payor, MD 10/02/12 2320

## 2012-10-02 NOTE — ED Notes (Signed)
Per EMS, Pt, from [place], sts she took 10 Hydrocodone pills "throughout the day."  Pt sts "I'm trying to get away from my husband."  Pt A & Ox4.  Vitals are stable.  Pt is IVC.
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
## 2012-10-02 NOTE — ED Notes (Signed)
Act team at bedside to assess pt status.

## 2012-10-02 NOTE — ED Notes (Signed)
Patient transported to X-ray 

## 2012-10-02 NOTE — ED Notes (Signed)
Pt sts she has taken 115 Hydrocodone tablets, since 09/25/12.  Sts she is "trying to get away from her husband."  Currently, denies SI, unless "I am around my husband."  Vitals are stable.

## 2012-10-02 NOTE — ED Notes (Signed)
[id number]> Expected date:<BR> Expected time:<BR> Means of arrival:<BR> Comments:<BR> IVC from [facility]

## 2012-10-02 NOTE — Progress Notes (Signed)
CSW ref patient to [facility].   .Catha Gosselin, LCSWA  [phone] .10/02/2012 10:04pm

## 2012-10-02 NOTE — Progress Notes (Signed)
Pt shares that they have a 70 year old son with epilepsy with other medical complications that lives at home with patient and pt spouse. Pt reports that she has guardianship of patient. Pt states that pt son is safe at home with pt spouse, and that pt spouse is able to provide care for pt son at home including pt son feeding tube. Pt and CSW called pt husband from room to ensure that pt husband was aware of patient sons needs and that patient would be staying at the hospital. Pt son's cm is Ms. Jason Nest [phone]. CSW will follow up with case manager with pt permission regarding pt hospitalization.   Catha Gosselin, LCSWA  [phone] .10/02/2012 8:56pm

## 2012-10-02 NOTE — ED Notes (Signed)
Pt. Is not able to use the restroom at this time, but she is aware that we need a urine a specimen.

## 2012-10-02 NOTE — ED Notes (Signed)
Contacted [facility] and informed that they do not have any beds and Pt was in their Outpatient program.

## 2012-10-03 DIAGNOSIS — F1490 Cocaine use, unspecified, uncomplicated: Secondary | ICD-10-CM | POA: Diagnosis present

## 2012-10-03 MED ORDER — QUETIAPINE FUMARATE 25 MG PO TABS: 25.0000 mg | ORAL_TABLET | Freq: Every day | ORAL | Status: DC

## 2012-10-03 MED ORDER — HYDROCODONE-ACETAMINOPHEN 5-325 MG PO TABS
1.0000 | ORAL_TABLET | Freq: Four times a day (QID) | ORAL | Status: DC | PRN
  Administered 2012-10-03: 1 via ORAL
  Filled 2012-10-03: qty 1

## 2012-10-03 MED ORDER — MECLIZINE HCL 25 MG PO TABS: 25.0000 mg | ORAL_TABLET | Freq: Three times a day (TID) | ORAL | Status: DC | PRN

## 2012-10-03 MED ORDER — FUROSEMIDE 40 MG PO TABS
40.0000 mg | ORAL_TABLET | Freq: Every day | ORAL | Status: DC | PRN
  Filled 2012-10-03: qty 1

## 2012-10-03 NOTE — Progress Notes (Signed)
Pt psychiatrically cleared by psychiatrist. Pt plans to follow up with establishes providers at [facility].   Catha Gosselin, LCSWA  [phone] .10/03/2012 1004pm

## 2012-10-03 NOTE — ED Notes (Signed)
Report given to pysch ED. Pt transferred to room 38

## 2012-10-03 NOTE — ED Provider Notes (Addendum)
5:04 PM I reviewed psychiatry's recommendations. They are recommending reversal of her commitment. Discharge as she has been medically cleared. Patient advised to be compliant with her medications and followup appointments. She is advised not to use alcohol or drugs. Prescription for seroquel, 25 mg by mouth every evening provided per psychiatry recommendations. Continue with the same psychiatric medications otherwise. Close outpatient followup.   Raeford Razor, MD 10/03/12 [phone]

## 2012-10-03 NOTE — ED Provider Notes (Signed)
Patient resting comfortably without current problems. Awaiting evaluation for placement/disposition.   Gilda Crease, MD 10/03/12 [phone]

## 2012-10-03 NOTE — ED Notes (Signed)
Patient belongings placed in locker #29 (3bags)

## 2012-10-03 NOTE — Clinical Social Work Note (Addendum)
Pt has been declined at [facility].  OV states no contract with pt insurance.  CSW has pt being reviewed at the following: [facility], [address], [employer], [place], [place], [place]   Pt declined at [facility].    Vickii Penna, LCSWA [phone]  Clinical Social Work

## 2012-10-07 ENCOUNTER — Other Ambulatory Visit: Payer: Medicare Other | Admitting: Lab

## 2012-10-07 ENCOUNTER — Ambulatory Visit: Payer: Medicare Other

## 2012-10-11 ENCOUNTER — Encounter (HOSPITAL_COMMUNITY): Payer: Self-pay | Admitting: Emergency Medicine

## 2012-10-11 ENCOUNTER — Emergency Department (HOSPITAL_COMMUNITY)
Admission: EM | Admit: 2012-10-11 | Discharge: 2012-10-11 | Disposition: A | Discharge status: Home / Self Care | Payer: PRIVATE HEALTH INSURANCE | Attending: Emergency Medicine | Admitting: Emergency Medicine

## 2012-10-11 DIAGNOSIS — K219 Gastro-esophageal reflux disease without esophagitis: Secondary | ICD-10-CM | POA: Insufficient documentation

## 2012-10-11 DIAGNOSIS — F329 Major depressive disorder, single episode, unspecified: Secondary | ICD-10-CM | POA: Insufficient documentation

## 2012-10-11 DIAGNOSIS — D509 Iron deficiency anemia, unspecified: Secondary | ICD-10-CM | POA: Insufficient documentation

## 2012-10-11 DIAGNOSIS — F3289 Other specified depressive episodes: Secondary | ICD-10-CM | POA: Insufficient documentation

## 2012-10-11 DIAGNOSIS — M549 Dorsalgia, unspecified: Secondary | ICD-10-CM | POA: Insufficient documentation

## 2012-10-11 DIAGNOSIS — F209 Schizophrenia, unspecified: Secondary | ICD-10-CM | POA: Insufficient documentation

## 2012-10-11 DIAGNOSIS — Z79899 Other long term (current) drug therapy: Secondary | ICD-10-CM | POA: Insufficient documentation

## 2012-10-11 DIAGNOSIS — Z8739 Personal history of other diseases of the musculoskeletal system and connective tissue: Secondary | ICD-10-CM | POA: Insufficient documentation

## 2012-10-11 DIAGNOSIS — Z86711 Personal history of pulmonary embolism: Secondary | ICD-10-CM | POA: Insufficient documentation

## 2012-10-11 DIAGNOSIS — Z8719 Personal history of other diseases of the digestive system: Secondary | ICD-10-CM | POA: Insufficient documentation

## 2012-10-11 DIAGNOSIS — M2550 Pain in unspecified joint: Secondary | ICD-10-CM | POA: Insufficient documentation

## 2012-10-11 DIAGNOSIS — E785 Hyperlipidemia, unspecified: Secondary | ICD-10-CM | POA: Insufficient documentation

## 2012-10-11 DIAGNOSIS — Z86718 Personal history of other venous thrombosis and embolism: Secondary | ICD-10-CM | POA: Insufficient documentation

## 2012-10-11 DIAGNOSIS — G8929 Other chronic pain: Secondary | ICD-10-CM | POA: Insufficient documentation

## 2012-10-11 DIAGNOSIS — J45909 Unspecified asthma, uncomplicated: Secondary | ICD-10-CM | POA: Insufficient documentation

## 2012-10-11 DIAGNOSIS — I10 Essential (primary) hypertension: Secondary | ICD-10-CM | POA: Insufficient documentation

## 2012-10-11 DIAGNOSIS — F411 Generalized anxiety disorder: Secondary | ICD-10-CM | POA: Insufficient documentation

## 2012-10-11 MED ORDER — ONDANSETRON HCL 4 MG/2ML IJ SOLN
4.0000 mg | Freq: Once | INTRAMUSCULAR | Status: AC
  Administered 2012-10-11: 4 mg via INTRAMUSCULAR
  Filled 2012-10-11: qty 2

## 2012-10-11 MED ORDER — MORPHINE SULFATE 4 MG/ML IJ SOLN
6.0000 mg | Freq: Once | INTRAMUSCULAR | Status: AC
  Administered 2012-10-11: 6 mg via INTRAMUSCULAR
  Filled 2012-10-11: qty 2

## 2012-10-11 NOTE — ED Provider Notes (Signed)
History     CSN: [id number]  Arrival date & time 10/11/12  1213   First MD Initiated Contact with Patient 10/11/12 1305      Chief Complaint  Patient presents with  . Generalized Body Aches    (Consider location/radiation/quality/duration/timing/severity/associated sxs/prior treatment) HPI [name] [name] is a 72 y.o. female who presents to ED complaining of diffuse body aches, muscle aches, joint aches. States had psoriasis. [facility]. States has bad arthritis that is "everywhere." takes neuron tin at home. States when it gets this bad she requires a "shot." Pt states morphine and zofran short ha worked in the past. Denies any new injuries. No fever, chills, malaise. No joint swelling. No warmth of joints to palpation.  Past Medical History  Diagnosis Date  . Depression   . Schizophrenia   . Arthritis   . Hyperlipidemia   . Hypertension   . Chronic pain   . 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 DVT (deep venous thrombosis)     per 01/17/10 d/c summary- "remote hx of dvt"?  . Iron deficiency anemia   . Chronic headaches   . Sleep apnea   . Glaucoma(365)   . Hemorrhoids   . Asthma   . Anxiety   . GERD (gastroesophageal reflux disease)   . Psoriasis   . Psoriasis 04/29/2012    New onset evaluated by Dr Marylou Flesher, Dermatology, [facility] 6/13  Rx 0.1% Tacrolimus ointment    Past Surgical History  Procedure Date  . Spinal fusion     2004  . Joint replacement right knee 11/2008  . Tubal ligation year 59  . Carpal tunnel release     bilateral     Family History  Problem Relation Age of Onset  . Diabetes Sister   . Colon cancer Brother 40    History  Substance Use Topics  . Smoking status: Never Smoker   . Smokeless tobacco: Never Used  . Alcohol Use: No    OB History    Grav Para Term Preterm Abortions TAB SAB Ect Mult Living                  Review of Systems  Constitutional: Negative for  fever and chills.  HENT: Negative for neck pain.   Respiratory: Negative.   Cardiovascular: Negative.   Genitourinary: Negative for dysuria and flank pain.  Musculoskeletal: Positive for myalgias, back pain and arthralgias. Negative for joint swelling.  Skin: Negative.   Neurological: Negative for dizziness, weakness and headaches.    Allergies  Adhesive  Home Medications   Current Outpatient Rx  Name  Route  Sig  Dispense  Refill  . ALBUTEROL SULFATE HFA 108 (90 BASE) MCG/ACT IN AERS   Inhalation   Inhale 2 puffs into the lungs 2 (two) times daily as needed. For shortness of breath and wheezing         . ATENOLOL 100 MG PO TABS   Oral   Take 100 mg by mouth daily.          Marland Kitchen CAMPHOR-MENTHOL 0.5-0.5 % EX LOTN   Topical   Apply 1 application topically 2 (two) times daily.         Marland Kitchen CLONAZEPAM 0.5 MG PO TABS   Oral   Take 0.5 mg by mouth 3 (three) times daily as needed. For anxiety         . ESOMEPRAZOLE MAGNESIUM  40 MG PO CPDR   Oral   Take 40 mg by mouth daily before breakfast.          . FERROUS SULFATE 325 (65 FE) MG PO TABS   Oral   Take 325 mg by mouth daily with breakfast.          . FLUOXETINE HCL 20 MG PO CAPS   Oral   Take 60 mg by mouth every morning.          Marland Kitchen FLUTICASONE PROPIONATE  HFA 220 MCG/ACT IN AERO   Inhalation   Inhale 1 puff into the lungs 2 (two) times daily.         . FUROSEMIDE 40 MG PO TABS   Oral   Take 40 mg by mouth daily as needed. edema         . GABAPENTIN 100 MG PO CAPS   Oral   Take 100 mg by mouth 3 (three) times daily.         Marland Kitchen HYDROCHLOROTHIAZIDE 25 MG PO TABS   Oral   Take 25 mg by mouth daily. For high blood pressure          . HYDROCODONE-ACETAMINOPHEN 5-325 MG PO TABS   Oral   Take 1 tablet by mouth every 6 (six) hours as needed. pain         . HYDROXYZINE HCL 25 MG PO TABS   Oral   Take 25 mg by mouth every 6 (six) hours as needed. itching         . MECLIZINE HCL 50 MG PO TABS    Oral   Take 25 mg by mouth 3 (three) times daily as needed. vertigo         . PRESCRIPTION MEDICATION   Topical   Apply 1 application topically 2 (two) times daily. Cream compounded at [facility]  riamcinolone 0.1%, silver sulfadiazine 1%   3-1   Applies to entire body         . QUETIAPINE FUMARATE 25 MG PO TABS   Oral   Take 1 tablet (25 mg total) by mouth at bedtime.   30 tablet   1   . TACROLIMUS 0.1 % EX OINT   Topical   Apply 1 application topically 2 (two) times daily as needed. psoriasis         . TRAMADOL HCL 50 MG PO TABS   Oral   Take 1 tablet (50 mg total) by mouth every 6 (six) hours as needed for pain.   15 tablet   0   . TRIAMCINOLONE ACETONIDE 0.1 % EX CREA   Topical   Apply 1 application topically 2 (two) times daily as needed. Entire body psoriasis         . WARFARIN SODIUM 5 MG PO TABS   Oral   Take 5 mg by mouth daily.           BP 145/83  Pulse 53  Temp 97.6 F (36.4 C) (Oral)  Resp 16  Ht 5\' 5"  (1.651 m)  Wt 240 lb (108.863 kg)  BMI 39.94 kg/m2  SpO2 97%  LMP 11/22/1996  Physical Exam  Nursing note and vitals reviewed. Constitutional: She is oriented to person, place, and time. She appears well-developed and well-nourished. No distress.  HENT:  Head: Normocephalic.  Eyes: Conjunctivae normal are normal.  Neck: Neck supple.  Cardiovascular: Normal rate, regular rhythm and normal heart sounds.   Pulmonary/Chest: Effort normal and breath sounds normal. No respiratory distress.  She has no wheezes. She has no rales.  Musculoskeletal: She exhibits no edema.       No joint swelling. Pain with ROM of the right knee, no swelling, no warmth with palpation. Joint stable. Pain with movement of most of joints including shoulders, elbows, hands. Pt is ambulatory.   Neurological: She is alert and oriented to person, place, and time.  Skin: Skin is warm and dry.  Psychiatric: She has a normal mood and affect.    ED Course  Procedures  (including critical care time)  Labs Reviewed - No data to display No results found.   1. Arthralgia       MDM  Pt with myalgias and arthralgias. No signs of injury or septic joint. Pt asking for a shot of pain medications. Morphine and zofran IM ordered. Pt tolearating it well. Will d/c home with follow up with her doctors. She is otherwise in no distress. Non toxic appearing.           Lottie Mussel, PA 10/11/12 1517

## 2012-10-11 NOTE — ED Notes (Signed)
Reports has been hurting right knee for a month worse when weather changed to rain. Patient is here for a shot for pain medication.

## 2012-10-11 NOTE — ED Provider Notes (Signed)
Medical screening examination/treatment/procedure(s) were performed by non-physician practitioner and as supervising physician I was immediately available for consultation/collaboration.   Dione Booze, MD 10/11/12 [phone]

## 2012-10-30 ENCOUNTER — Emergency Department (HOSPITAL_COMMUNITY)
Admission: EM | Admit: 2012-10-30 | Discharge: 2012-10-30 | Disposition: A | Discharge status: Home / Self Care | Payer: PRIVATE HEALTH INSURANCE | Attending: Emergency Medicine | Admitting: Emergency Medicine

## 2012-10-30 ENCOUNTER — Encounter (HOSPITAL_COMMUNITY): Payer: Self-pay | Admitting: *Deleted

## 2012-10-30 DIAGNOSIS — M2550 Pain in unspecified joint: Secondary | ICD-10-CM | POA: Insufficient documentation

## 2012-10-30 DIAGNOSIS — L408 Other psoriasis: Secondary | ICD-10-CM | POA: Insufficient documentation

## 2012-10-30 DIAGNOSIS — Z86718 Personal history of other venous thrombosis and embolism: Secondary | ICD-10-CM | POA: Insufficient documentation

## 2012-10-30 DIAGNOSIS — J45909 Unspecified asthma, uncomplicated: Secondary | ICD-10-CM | POA: Insufficient documentation

## 2012-10-30 DIAGNOSIS — Z8669 Personal history of other diseases of the nervous system and sense organs: Secondary | ICD-10-CM | POA: Insufficient documentation

## 2012-10-30 DIAGNOSIS — F329 Major depressive disorder, single episode, unspecified: Secondary | ICD-10-CM | POA: Insufficient documentation

## 2012-10-30 DIAGNOSIS — Z86711 Personal history of pulmonary embolism: Secondary | ICD-10-CM | POA: Insufficient documentation

## 2012-10-30 DIAGNOSIS — Z8679 Personal history of other diseases of the circulatory system: Secondary | ICD-10-CM | POA: Insufficient documentation

## 2012-10-30 DIAGNOSIS — Z8639 Personal history of other endocrine, nutritional and metabolic disease: Secondary | ICD-10-CM | POA: Insufficient documentation

## 2012-10-30 DIAGNOSIS — F3289 Other specified depressive episodes: Secondary | ICD-10-CM | POA: Insufficient documentation

## 2012-10-30 DIAGNOSIS — D509 Iron deficiency anemia, unspecified: Secondary | ICD-10-CM | POA: Insufficient documentation

## 2012-10-30 DIAGNOSIS — F411 Generalized anxiety disorder: Secondary | ICD-10-CM | POA: Insufficient documentation

## 2012-10-30 DIAGNOSIS — M129 Arthropathy, unspecified: Secondary | ICD-10-CM | POA: Insufficient documentation

## 2012-10-30 DIAGNOSIS — Z7901 Long term (current) use of anticoagulants: Secondary | ICD-10-CM | POA: Insufficient documentation

## 2012-10-30 DIAGNOSIS — Z862 Personal history of diseases of the blood and blood-forming organs and certain disorders involving the immune mechanism: Secondary | ICD-10-CM | POA: Insufficient documentation

## 2012-10-30 DIAGNOSIS — Z79899 Other long term (current) drug therapy: Secondary | ICD-10-CM | POA: Insufficient documentation

## 2012-10-30 DIAGNOSIS — G8929 Other chronic pain: Secondary | ICD-10-CM | POA: Insufficient documentation

## 2012-10-30 DIAGNOSIS — F209 Schizophrenia, unspecified: Secondary | ICD-10-CM | POA: Insufficient documentation

## 2012-10-30 DIAGNOSIS — I10 Essential (primary) hypertension: Secondary | ICD-10-CM | POA: Insufficient documentation

## 2012-10-30 DIAGNOSIS — K219 Gastro-esophageal reflux disease without esophagitis: Secondary | ICD-10-CM | POA: Insufficient documentation

## 2012-10-30 MED ORDER — ONDANSETRON HCL 4 MG/2ML IJ SOLN: 4.0000 mg | Freq: Once | INTRAMUSCULAR | Status: DC

## 2012-10-30 MED ORDER — ONDANSETRON HCL 4 MG/2ML IJ SOLN
4.0000 mg | Freq: Once | INTRAMUSCULAR | Status: AC
  Administered 2012-10-30: 4 mg via INTRAMUSCULAR
  Filled 2012-10-30: qty 2

## 2012-10-30 MED ORDER — MORPHINE SULFATE 2 MG/ML IJ SOLN: 2.0000 mg | Freq: Once | INTRAMUSCULAR | Status: DC

## 2012-10-30 MED ORDER — MORPHINE SULFATE 2 MG/ML IJ SOLN
2.0000 mg | Freq: Once | INTRAMUSCULAR | Status: AC
  Administered 2012-10-30: 2 mg via INTRAMUSCULAR
  Filled 2012-10-30: qty 1

## 2012-10-30 NOTE — ED Notes (Signed)
Pt brought in by EMS; pt c/o arthritis pain; joint pain all over; normally takes gabapentin but is out

## 2012-10-30 NOTE — ED Notes (Signed)
Patient reports she recently developed psoriasis and this has worsened her arthritis.  Patient does not like to take pain medication because of memory loss and instability.  She goes to an arthritis clinic in [place].

## 2012-10-30 NOTE — ED Provider Notes (Signed)
Medical screening examination/treatment/procedure(s) were performed by non-physician practitioner and as supervising physician I was immediately available for consultation/collaboration. Devoria Albe, MD, [facility]   Ward Givens, MD 10/30/12 2121

## 2012-10-30 NOTE — ED Provider Notes (Signed)
History   This chart was scribed for non-physician practitioner Ebbie Ridge, PA-C working with Ward Givens, MD by Gerlean Ren, ED Scribe. This patient was seen in room WTR5/WTR5 and the patient's care was started at 8:48 PM.    CSN: [id number]  Arrival date & time 10/30/12  1913   First MD Initiated Contact with Patient 10/30/12 2034      No chief complaint on file.   The history is provided by the patient. No language interpreter was used.  [name] [name] is a 72 y.o. female brought in by [facility] the Emergency Department complaining of constant, non-changing, aching, chronic arthritis pain in multiple joints that she states is being followed by [facility] to determine whether or not it is related to her psoriasis.  Pt states she is here to have medications refilled but does not want any pain medication.  Past Medical History  Diagnosis Date  . Depression   . Schizophrenia   . Arthritis   . Hyperlipidemia   . Hypertension   . Chronic pain   . 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 DVT (deep venous thrombosis)     per 01/17/10 d/c summary- "remote hx of dvt"?  . Iron deficiency anemia   . Chronic headaches   . Sleep apnea   . Glaucoma(365)   . Hemorrhoids   . Asthma   . Anxiety   . GERD (gastroesophageal reflux disease)   . Psoriasis   . Psoriasis 04/29/2012    New onset evaluated by Dr Marylou Flesher, Dermatology, [facility] 6/13  Rx 0.1% Tacrolimus ointment    Past Surgical History  Procedure Date  . Spinal fusion     2004  . Joint replacement right knee 11/2008  . Tubal ligation year 80  . Carpal tunnel release     bilateral     Family History  Problem Relation Age of Onset  . Diabetes Sister   . Colon cancer Brother 40    History  Substance Use Topics  . Smoking status: Never Smoker   . Smokeless tobacco: Never Used  . Alcohol Use: No    No OB history provided.   Review of Systems A complete 10 system review of  systems was obtained and all systems are negative except as noted in the HPI and PMH.   Allergies  Adhesive  Home Medications   Current Outpatient Rx  Name  Route  Sig  Dispense  Refill  . ALBUTEROL SULFATE HFA 108 (90 BASE) MCG/ACT IN AERS   Inhalation   Inhale 2 puffs into the lungs 2 (two) times daily as needed. For shortness of breath and wheezing         . ATENOLOL 100 MG PO TABS   Oral   Take 100 mg by mouth daily.          Marland Kitchen CAMPHOR-MENTHOL 0.5-0.5 % EX LOTN   Topical   Apply 1 application topically 2 (two) times daily.         Marland Kitchen CLONAZEPAM 0.5 MG PO TABS   Oral   Take 0.5 mg by mouth 3 (three) times daily as needed. For anxiety         . ESOMEPRAZOLE MAGNESIUM 40 MG PO CPDR   Oral   Take 40 mg by mouth daily before breakfast.          . FERROUS SULFATE 325 (65 FE) MG PO TABS   Oral   Take  325 mg by mouth daily with breakfast.          . FLUOXETINE HCL 20 MG PO CAPS   Oral   Take 60 mg by mouth every morning.          Marland Kitchen FLUTICASONE PROPIONATE  HFA 220 MCG/ACT IN AERO   Inhalation   Inhale 1 puff into the lungs 2 (two) times daily.         . FUROSEMIDE 40 MG PO TABS   Oral   Take 40 mg by mouth daily as needed. edema         . GABAPENTIN 100 MG PO CAPS   Oral   Take 100 mg by mouth 3 (three) times daily.         Marland Kitchen HYDROCHLOROTHIAZIDE 25 MG PO TABS   Oral   Take 25 mg by mouth daily. For high blood pressure          . HYDROXYZINE HCL 10 MG PO TABS   Oral   Take 10 mg by mouth daily as needed.         Marland Kitchen MECLIZINE HCL 50 MG PO TABS   Oral   Take 25 mg by mouth 3 (three) times daily as needed. vertigo         . PRESCRIPTION MEDICATION   Topical   Apply 1 application topically 2 (two) times daily. Cream compounded at [facility]  riamcinolone 0.1%, silver sulfadiazine 1%   3-1   Applies to entire body         . TACROLIMUS 0.1 % EX OINT   Topical   Apply 1 application topically 2 (two) times daily as needed. psoriasis          . TRIAMCINOLONE ACETONIDE 0.1 % EX CREA   Topical   Apply 1 application topically 2 (two) times daily as needed. Entire body psoriasis         . WARFARIN SODIUM 5 MG PO TABS   Oral   Take 5 mg by mouth daily.           BP 131/77  Pulse 61  Temp 98 F (36.7 C) (Oral)  Resp 20  SpO2 97%  LMP 11/22/1996  Physical Exam  Nursing note and vitals reviewed. Constitutional: She is oriented to person, place, and time. She appears well-developed and well-nourished. No distress.  HENT:  Head: Normocephalic and atraumatic.  Eyes: EOM are normal.  Neck: Neck supple. No tracheal deviation present.  Cardiovascular: Normal rate.   Pulmonary/Chest: Effort normal. No respiratory distress.  Musculoskeletal: Normal range of motion.  Neurological: She is alert and oriented to person, place, and time.  Skin: Skin is warm and dry.  Psychiatric: She has a normal mood and affect. Her behavior is normal.    ED Course  Procedures (including critical care time) DIAGNOSTIC STUDIES: Oxygen Saturation is 97% on room air, adequate by my interpretation.    COORDINATION OF CARE: 8:57 PM- Patient specifically requests that she not receive pain medication.  Patient will be referred to her PCP. Patient has no new complaints tonight   MDM  I personally performed the services described in this documentation, which was scribed in my presence. The recorded information has been reviewed and is accurate.         Carlyle Dolly, PA-C 10/30/12 2120

## 2012-11-04 ENCOUNTER — Other Ambulatory Visit: Payer: Medicare Other | Admitting: Lab

## 2012-11-04 ENCOUNTER — Ambulatory Visit: Payer: Medicare Other

## 2012-11-07 ENCOUNTER — Ambulatory Visit: Payer: Medicare Other

## 2012-11-07 ENCOUNTER — Ambulatory Visit: Payer: Medicare Other | Admitting: Lab

## 2012-11-14 ENCOUNTER — Emergency Department (HOSPITAL_COMMUNITY)
Admission: EM | Admit: 2012-11-14 | Discharge: 2012-11-15 | Disposition: A | Discharge status: Home / Self Care | Payer: Medicare Other | Attending: Emergency Medicine | Admitting: Emergency Medicine

## 2012-11-14 ENCOUNTER — Encounter (HOSPITAL_COMMUNITY): Payer: Self-pay | Admitting: Emergency Medicine

## 2012-11-14 DIAGNOSIS — Z86718 Personal history of other venous thrombosis and embolism: Secondary | ICD-10-CM | POA: Insufficient documentation

## 2012-11-14 DIAGNOSIS — D509 Iron deficiency anemia, unspecified: Secondary | ICD-10-CM | POA: Insufficient documentation

## 2012-11-14 DIAGNOSIS — R531 Weakness: Secondary | ICD-10-CM

## 2012-11-14 DIAGNOSIS — I10 Essential (primary) hypertension: Secondary | ICD-10-CM | POA: Insufficient documentation

## 2012-11-14 DIAGNOSIS — K219 Gastro-esophageal reflux disease without esophagitis: Secondary | ICD-10-CM | POA: Insufficient documentation

## 2012-11-14 DIAGNOSIS — E860 Dehydration: Secondary | ICD-10-CM | POA: Insufficient documentation

## 2012-11-14 DIAGNOSIS — G4730 Sleep apnea, unspecified: Secondary | ICD-10-CM | POA: Insufficient documentation

## 2012-11-14 DIAGNOSIS — Z79899 Other long term (current) drug therapy: Secondary | ICD-10-CM | POA: Insufficient documentation

## 2012-11-14 DIAGNOSIS — R0600 Dyspnea, unspecified: Secondary | ICD-10-CM

## 2012-11-14 DIAGNOSIS — Z86711 Personal history of pulmonary embolism: Secondary | ICD-10-CM | POA: Insufficient documentation

## 2012-11-14 DIAGNOSIS — Z8669 Personal history of other diseases of the nervous system and sense organs: Secondary | ICD-10-CM | POA: Insufficient documentation

## 2012-11-14 DIAGNOSIS — Z8639 Personal history of other endocrine, nutritional and metabolic disease: Secondary | ICD-10-CM | POA: Insufficient documentation

## 2012-11-14 DIAGNOSIS — F411 Generalized anxiety disorder: Secondary | ICD-10-CM | POA: Insufficient documentation

## 2012-11-14 DIAGNOSIS — Z872 Personal history of diseases of the skin and subcutaneous tissue: Secondary | ICD-10-CM | POA: Insufficient documentation

## 2012-11-14 DIAGNOSIS — Z8719 Personal history of other diseases of the digestive system: Secondary | ICD-10-CM | POA: Insufficient documentation

## 2012-11-14 DIAGNOSIS — Z8679 Personal history of other diseases of the circulatory system: Secondary | ICD-10-CM | POA: Insufficient documentation

## 2012-11-14 DIAGNOSIS — J45909 Unspecified asthma, uncomplicated: Secondary | ICD-10-CM | POA: Insufficient documentation

## 2012-11-14 DIAGNOSIS — R0989 Other specified symptoms and signs involving the circulatory and respiratory systems: Secondary | ICD-10-CM | POA: Insufficient documentation

## 2012-11-14 DIAGNOSIS — R5381 Other malaise: Secondary | ICD-10-CM | POA: Insufficient documentation

## 2012-11-14 DIAGNOSIS — M549 Dorsalgia, unspecified: Secondary | ICD-10-CM

## 2012-11-14 DIAGNOSIS — Z981 Arthrodesis status: Secondary | ICD-10-CM | POA: Insufficient documentation

## 2012-11-14 DIAGNOSIS — F209 Schizophrenia, unspecified: Secondary | ICD-10-CM | POA: Insufficient documentation

## 2012-11-14 DIAGNOSIS — R0602 Shortness of breath: Secondary | ICD-10-CM | POA: Insufficient documentation

## 2012-11-14 DIAGNOSIS — Z7901 Long term (current) use of anticoagulants: Secondary | ICD-10-CM | POA: Insufficient documentation

## 2012-11-14 DIAGNOSIS — Z862 Personal history of diseases of the blood and blood-forming organs and certain disorders involving the immune mechanism: Secondary | ICD-10-CM | POA: Insufficient documentation

## 2012-11-14 DIAGNOSIS — Z8739 Personal history of other diseases of the musculoskeletal system and connective tissue: Secondary | ICD-10-CM | POA: Insufficient documentation

## 2012-11-14 DIAGNOSIS — R0609 Other forms of dyspnea: Secondary | ICD-10-CM | POA: Insufficient documentation

## 2012-11-14 DIAGNOSIS — M546 Pain in thoracic spine: Secondary | ICD-10-CM | POA: Insufficient documentation

## 2012-11-14 NOTE — ED Notes (Signed)
[id number]<AV> Expected date:<BR> Expected time:<BR> Means of arrival:<BR> Comments:<BR> Rm 16, Medic 222, 71 F, Gen Weakness

## 2012-11-14 NOTE — ED Notes (Signed)
As per EMS pt c/o chronic back pain,SOB and weakness with exertion.VSS

## 2012-11-15 ENCOUNTER — Encounter (HOSPITAL_COMMUNITY): Payer: Self-pay | Admitting: Radiology

## 2012-11-15 ENCOUNTER — Emergency Department (HOSPITAL_COMMUNITY): Payer: Medicare Other

## 2012-11-15 LAB — CBC WITH DIFFERENTIAL/PLATELET
Eosinophils Absolute: 0.3 10*3/uL (ref 0.0–0.7)
Eosinophils Relative: 4 % (ref 0–5)
HCT: 35.1 % — ABNORMAL LOW (ref 36.0–46.0)
Hemoglobin: 11.7 g/dL — ABNORMAL LOW (ref 12.0–15.0)
Lymphocytes Relative: 24 % (ref 12–46)
Lymphs Abs: 1.6 10*3/uL (ref 0.7–4.0)
MCH: 31.8 pg (ref 26.0–34.0)
MCV: 95.4 fL (ref 78.0–100.0)
Monocytes Absolute: 0.5 10*3/uL (ref 0.1–1.0)
Monocytes Relative: 7 % (ref 3–12)
Platelets: 217 10*3/uL (ref 150–400)
RBC: 3.68 MIL/uL — ABNORMAL LOW (ref 3.87–5.11)
WBC: 6.9 10*3/uL (ref 4.0–10.5)

## 2012-11-15 LAB — BASIC METABOLIC PANEL
BUN: 16 mg/dL (ref 6–23)
CO2: 30 mEq/L (ref 19–32)
Calcium: 9 mg/dL (ref 8.4–10.5)
GFR calc non Af Amer: 42 mL/min — ABNORMAL LOW (ref >90)
Glucose, Bld: 104 mg/dL — ABNORMAL HIGH (ref 70–99)
Sodium: 138 mEq/L (ref 135–145)

## 2012-11-15 LAB — URINALYSIS, ROUTINE W REFLEX MICROSCOPIC
Bilirubin Urine: NEGATIVE
Glucose, UA: NEGATIVE mg/dL
Ketones, ur: NEGATIVE mg/dL
Nitrite: NEGATIVE
Specific Gravity, Urine: 1.017 (ref 1.005–1.030)
pH: 6.5 (ref 5.0–8.0)

## 2012-11-15 LAB — URINE MICROSCOPIC-ADD ON

## 2012-11-15 LAB — PROTIME-INR: INR: 1.75 — ABNORMAL HIGH (ref 0.00–1.49)

## 2012-11-15 MED ORDER — METHOCARBAMOL 500 MG PO TABS: 500.0000 mg | ORAL_TABLET | Freq: Three times a day (TID) | ORAL | Status: DC

## 2012-11-15 MED ORDER — TRAMADOL HCL 50 MG PO TABS
50.0000 mg | ORAL_TABLET | Freq: Once | ORAL | Status: AC
  Administered 2012-11-15: 50 mg via ORAL
  Filled 2012-11-15: qty 1

## 2012-11-15 MED ORDER — TRAMADOL HCL 50 MG PO TABS: 50.0000 mg | ORAL_TABLET | Freq: Three times a day (TID) | ORAL | Status: DC | PRN

## 2012-11-15 MED ORDER — IOHEXOL 350 MG/ML SOLN
100.0000 mL | Freq: Once | INTRAVENOUS | Status: AC | PRN
  Administered 2012-11-15: 100 mL via INTRAVENOUS

## 2012-11-15 MED ORDER — SODIUM CHLORIDE 0.9 % IV BOLUS (SEPSIS)
1000.0000 mL | Freq: Once | INTRAVENOUS | Status: AC
  Administered 2012-11-15: 1000 mL via INTRAVENOUS

## 2012-11-15 MED ORDER — METHOCARBAMOL 500 MG PO TABS
500.0000 mg | ORAL_TABLET | Freq: Once | ORAL | Status: AC
  Administered 2012-11-15: 500 mg via ORAL
  Filled 2012-11-15: qty 1

## 2012-11-15 NOTE — ED Provider Notes (Signed)
History     CSN: [id number]  Arrival date & time 11/14/12  2315   First MD Initiated Contact with Patient 11/15/12 0046      Chief Complaint  Patient presents with  . Weakness  . Back Pain    (Consider location/radiation/quality/duration/timing/severity/associated sxs/prior treatment) HPI 72 yo female presents to the ER via EMS from home with complaint of generalized weakness, fatigue, shortness of breath, upper back pain, and concern for another PE.  Pt reports symptoms have been going on for the last few days, worse tonight.  She denies missing any doses of her coumadin.  She is upset with her current primary care doctors as they are not sitting down with her and going over her medications and diagnoses with her.    Past Medical History  Diagnosis Date  . Depression   . Schizophrenia   . Arthritis   . Hyperlipidemia   . Hypertension   . Chronic pain   . 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 DVT (deep venous thrombosis)     per 01/17/10 d/c summary- "remote hx of dvt"?  . Iron deficiency anemia   . Chronic headaches   . Sleep apnea   . Glaucoma(365)   . Hemorrhoids   . Asthma   . Anxiety   . GERD (gastroesophageal reflux disease)   . Psoriasis   . Psoriasis 04/29/2012    New onset evaluated by Dr Marylou Flesher, Dermatology, [facility] 6/13  Rx 0.1% Tacrolimus ointment    Past Surgical History  Procedure Laterality Date  . Spinal fusion      2004  . Joint replacement  right knee 11/2008  . Tubal ligation  year 71  . Carpal tunnel release      bilateral     Family History  Problem Relation Age of Onset  . Diabetes Sister   . Colon cancer Brother 40    History  Substance Use Topics  . Smoking status: Never Smoker   . Smokeless tobacco: Never Used  . Alcohol Use: No    OB History   Grav Para Term Preterm Abortions TAB SAB Ect Mult Living                  Review of Systems  All other systems reviewed and are  negative.    Allergies  Adhesive  Home Medications   Current Outpatient Rx  Name  Route  Sig  Dispense  Refill  . albuterol (VENTOLIN HFA) 108 (90 BASE) MCG/ACT inhaler   Inhalation   Inhale 2 puffs into the lungs 2 (two) times daily as needed. For shortness of breath and wheezing         . atenolol (TENORMIN) 100 MG tablet   Oral   Take 100 mg by mouth every morning.          . camphor-menthol (SARNA) lotion   Topical   Apply 1 application topically 2 (two) times daily.         . clonazePAM (KLONOPIN) 0.5 MG tablet   Oral   Take 1.5 mg by mouth every morning. For anxiety         . esomeprazole (NEXIUM) 40 MG capsule   Oral   Take 40 mg by mouth daily before breakfast.          . ferrous sulfate 325 (65 FE) MG tablet   Oral   Take 325 mg by mouth daily with breakfast.          .  FLUoxetine (PROZAC) 20 MG capsule   Oral   Take 60 mg by mouth every morning.          . fluticasone (FLOVENT HFA) 220 MCG/ACT inhaler   Inhalation   Inhale 1 puff into the lungs 2 (two) times daily.         Marland Kitchen gabapentin (NEURONTIN) 100 MG capsule   Oral   Take 100-200 mg by mouth 2 (two) times daily. Take 2 capsules in the morning and 1 capsule at night         . hydrochlorothiazide 25 MG tablet   Oral   Take 25 mg by mouth every morning. For high blood pressure         . hydrOXYzine (ATARAX/VISTARIL) 10 MG tablet   Oral   Take 10 mg by mouth every morning.          . meclizine (ANTIVERT) 50 MG tablet   Oral   Take 25 mg by mouth 3 (three) times daily as needed. vertigo         . naproxen sodium (ANAPROX) 220 MG tablet   Oral   Take 440 mg by mouth 3 (three) times daily with meals.         Marland Kitchen PRESCRIPTION MEDICATION   Topical   Apply 1 application topically 2 (two) times daily. Cream compounded at [facility]  triamcinolone 0.1% 3 parts, silver sulfadiazine 1% 1 part Applies to entire body         . tacrolimus (PROTOPIC) 0.1 % ointment   Topical    Apply 1 application topically 2 (two) times daily as needed. psoriasis         . triamcinolone cream (KENALOG) 0.1 %   Topical   Apply 1 application topically 2 (two) times daily as needed. Entire body psoriasis         . warfarin (COUMADIN) 5 MG tablet   Oral   Take 5 mg by mouth every morning.          . methocarbamol (ROBAXIN) 500 MG tablet   Oral   Take 1 tablet (500 mg total) by mouth 3 (three) times daily.   30 tablet   0   . traMADol (ULTRAM) 50 MG tablet   Oral   Take 1-2 tablets (50-100 mg total) by mouth 3 (three) times daily as needed for pain.   30 tablet   0     BP 128/68  Pulse 75  Temp(Src) 98 F (36.7 C) (Oral)  Resp 18  SpO2 99%  LMP 11/22/1996  Physical Exam  Nursing note and vitals reviewed. Constitutional: She is oriented to person, place, and time. She appears well-developed and well-nourished.  HENT:  Head: Normocephalic and atraumatic.  Nose: Nose normal.  Mouth/Throat: Oropharynx is clear and moist.  Eyes: Conjunctivae and EOM are normal. Pupils are equal, round, and reactive to light.  Neck: Normal range of motion. Neck supple. No JVD present. No tracheal deviation present. No thyromegaly present.  Cardiovascular: Normal rate, regular rhythm, normal heart sounds and intact distal pulses.  Exam reveals no gallop and no friction rub.   No murmur heard. Pulmonary/Chest: Effort normal and breath sounds normal. No stridor. No respiratory distress. She has no wheezes. She has no rales. She exhibits no tenderness.  Abdominal: Soft. Bowel sounds are normal. She exhibits no distension and no mass. There is no tenderness. There is no rebound and no guarding.  Musculoskeletal: Normal range of motion. She exhibits no edema and no tenderness.  TTP over the entire back  Lymphadenopathy:    She has no cervical adenopathy.  Neurological: She is alert and oriented to person, place, and time. She has normal reflexes. No cranial nerve deficit. She exhibits  normal muscle tone. Coordination normal.  Skin: Skin is warm and dry. No rash noted. No erythema. No pallor.  Psychiatric: She has a normal mood and affect. Her behavior is normal. Judgment and thought content normal.    ED Course  Procedures (including critical care time)  Labs Reviewed  CBC WITH DIFFERENTIAL - Abnormal; Notable for the following:    RBC 3.68 (*)    Hemoglobin 11.7 (*)    HCT 35.1 (*)    All other components within normal limits  BASIC METABOLIC PANEL - Abnormal; Notable for the following:    Glucose, Bld 104 (*)    Creatinine, Ser 1.28 (*)    GFR calc non Af Amer 42 (*)    GFR calc Af Amer 48 (*)    All other components within normal limits  PROTIME-INR - Abnormal; Notable for the following:    Prothrombin Time 19.8 (*)    INR 1.75 (*)    All other components within normal limits  URINALYSIS, ROUTINE W REFLEX MICROSCOPIC - Abnormal; Notable for the following:    Hgb urine dipstick TRACE (*)    Leukocytes, UA TRACE (*)    All other components within normal limits  URINE MICROSCOPIC-ADD ON   Ct Angio Chest W/cm &/or Wo Cm  11/15/2012  *RADIOLOGY REPORT*  Clinical Data: Chronic back pain, shortness of breath, weakness on exertion.  CT ANGIOGRAPHY CHEST  Technique:  Multidetector CT imaging of the chest using the standard protocol during bolus administration of intravenous contrast. Multiplanar reconstructed images including MIPs were obtained and reviewed to evaluate the vascular anatomy.  Contrast: OMNIPAQUE IOHEXOL 350 MG/ML SOLN  Comparison: 11/16/2010  Findings: Technically adequate study with good opacification of the central and segmental pulmonary arteries.  No focal filling defects.  No evidence of significant pulmonary embolus.  Mild cardiac enlargement.  Prominent central pulmonary vascularity may represent pulmonary arterial hypertension.  Normal caliber thoracic aorta.  No significant lymphadenopathy in the chest.  No abnormal mediastinal fluid  collections.  The esophagus is decompressed.  The visualized upper abdominal organs demonstrate cholelithiasis.  No focal airspace disease or consolidation in the lungs.  Scattered scarring in the mid lungs.  Airways appear patent.  No pneumothorax.  No pleural effusions.  Degenerative changes in the thoracic spine.  IMPRESSION: No evidence of significant pulmonary embolus.  Cardiac enlargement with suggestion of pulmonary arterial hypertension.  No active lung disease.  Cholelithiasis.   Original Report Authenticated By: Burman Nieves, M.D.     Date: 11/15/2012  Rate: 79  Rhythm: normal sinus rhythm  QRS Axis: normal  Intervals: normal  ST/T Wave abnormalities: normal  Conduction Disutrbances:none  Narrative Interpretation:   Old EKG Reviewed: unchanged    1. Dehydration   2. Back pain   3. Dyspnea   4. Weakness       MDM  72 year old female with vague complaints, but reporting that she feels as though she has another PE. She denies missing any doses of her Coumadin. Her INR is slightly low. CT angioma does not show PE at this time. She does have some mild dehydration. Patient has been given IV fluids and is tolerating by mouth fluids well. Patient advised to followup with either her primary care doctor or specialists at [facility] for  further management.        Olivia Mackie, MD 11/16/12 [phone]

## 2012-11-15 NOTE — ED Notes (Signed)
Patient transported to CT 

## 2012-11-24 ENCOUNTER — Ambulatory Visit: Payer: Medicare Other

## 2012-11-24 ENCOUNTER — Other Ambulatory Visit: Payer: Medicare Other | Admitting: Lab

## 2012-11-26 ENCOUNTER — Ambulatory Visit (HOSPITAL_BASED_OUTPATIENT_CLINIC_OR_DEPARTMENT_OTHER): Payer: Medicare Other | Admitting: Pharmacist

## 2012-11-26 ENCOUNTER — Other Ambulatory Visit: Payer: Medicare Other | Admitting: Lab

## 2012-11-26 DIAGNOSIS — D6851 Activated protein C resistance: Secondary | ICD-10-CM

## 2012-11-26 DIAGNOSIS — Z7901 Long term (current) use of anticoagulants: Secondary | ICD-10-CM

## 2012-11-26 DIAGNOSIS — D6859 Other primary thrombophilia: Secondary | ICD-10-CM

## 2012-11-26 DIAGNOSIS — I2699 Other pulmonary embolism without acute cor pulmonale: Secondary | ICD-10-CM

## 2012-11-26 LAB — PROTIME-INR: INR: 3.2 (ref 2.00–3.50)

## 2012-11-26 NOTE — Patient Instructions (Addendum)
Continue Coumadin 5mg  every day.  Recheck INR in 1 month on 12/25/12  at 10:30am for lab and 10:45am for coumadin clinic.

## 2012-11-26 NOTE — Progress Notes (Signed)
INR slightly above goal. No problems to report. This has been a relatively stable dose for patient. INR at ED visit on 11/15/12 = 1.75. Pt stated she was told "to take an extra tablet" that day. Will continue Coumadin 5mg  every day.   Recheck INR in 1 month on 12/25/12 at 10:30am for lab and 10:45am for coumadin clinic.

## 2012-11-29 ENCOUNTER — Emergency Department (HOSPITAL_COMMUNITY)
Admission: EM | Admit: 2012-11-29 | Discharge: 2012-11-29 | Disposition: A | Discharge status: Home / Self Care | Payer: Medicare Other | Attending: Emergency Medicine | Admitting: Emergency Medicine

## 2012-11-29 DIAGNOSIS — J45909 Unspecified asthma, uncomplicated: Secondary | ICD-10-CM | POA: Insufficient documentation

## 2012-11-29 DIAGNOSIS — Z7901 Long term (current) use of anticoagulants: Secondary | ICD-10-CM | POA: Insufficient documentation

## 2012-11-29 DIAGNOSIS — Z79899 Other long term (current) drug therapy: Secondary | ICD-10-CM | POA: Insufficient documentation

## 2012-11-29 DIAGNOSIS — H409 Unspecified glaucoma: Secondary | ICD-10-CM | POA: Insufficient documentation

## 2012-11-29 DIAGNOSIS — S335XXA Sprain of ligaments of lumbar spine, initial encounter: Secondary | ICD-10-CM | POA: Insufficient documentation

## 2012-11-29 DIAGNOSIS — Z8719 Personal history of other diseases of the digestive system: Secondary | ICD-10-CM | POA: Insufficient documentation

## 2012-11-29 DIAGNOSIS — Z8739 Personal history of other diseases of the musculoskeletal system and connective tissue: Secondary | ICD-10-CM | POA: Insufficient documentation

## 2012-11-29 DIAGNOSIS — F209 Schizophrenia, unspecified: Secondary | ICD-10-CM | POA: Insufficient documentation

## 2012-11-29 DIAGNOSIS — F329 Major depressive disorder, single episode, unspecified: Secondary | ICD-10-CM | POA: Insufficient documentation

## 2012-11-29 DIAGNOSIS — F411 Generalized anxiety disorder: Secondary | ICD-10-CM | POA: Insufficient documentation

## 2012-11-29 DIAGNOSIS — I10 Essential (primary) hypertension: Secondary | ICD-10-CM | POA: Insufficient documentation

## 2012-11-29 DIAGNOSIS — Y9289 Other specified places as the place of occurrence of the external cause: Secondary | ICD-10-CM | POA: Insufficient documentation

## 2012-11-29 DIAGNOSIS — Z872 Personal history of diseases of the skin and subcutaneous tissue: Secondary | ICD-10-CM | POA: Insufficient documentation

## 2012-11-29 DIAGNOSIS — Y939 Activity, unspecified: Secondary | ICD-10-CM | POA: Insufficient documentation

## 2012-11-29 DIAGNOSIS — T148XXA Other injury of unspecified body region, initial encounter: Secondary | ICD-10-CM

## 2012-11-29 DIAGNOSIS — D509 Iron deficiency anemia, unspecified: Secondary | ICD-10-CM | POA: Insufficient documentation

## 2012-11-29 DIAGNOSIS — Z8679 Personal history of other diseases of the circulatory system: Secondary | ICD-10-CM | POA: Insufficient documentation

## 2012-11-29 DIAGNOSIS — Z8669 Personal history of other diseases of the nervous system and sense organs: Secondary | ICD-10-CM | POA: Insufficient documentation

## 2012-11-29 DIAGNOSIS — W19XXXA Unspecified fall, initial encounter: Secondary | ICD-10-CM | POA: Insufficient documentation

## 2012-11-29 DIAGNOSIS — Z86718 Personal history of other venous thrombosis and embolism: Secondary | ICD-10-CM | POA: Insufficient documentation

## 2012-11-29 DIAGNOSIS — E785 Hyperlipidemia, unspecified: Secondary | ICD-10-CM | POA: Insufficient documentation

## 2012-11-29 DIAGNOSIS — G8929 Other chronic pain: Secondary | ICD-10-CM | POA: Insufficient documentation

## 2012-11-29 DIAGNOSIS — F3289 Other specified depressive episodes: Secondary | ICD-10-CM | POA: Insufficient documentation

## 2012-11-29 MED ORDER — ONDANSETRON 4 MG PO TBDP
4.0000 mg | ORAL_TABLET | Freq: Once | ORAL | Status: AC
  Administered 2012-11-29: 4 mg via ORAL
  Filled 2012-11-29: qty 1

## 2012-11-29 MED ORDER — MORPHINE SULFATE 4 MG/ML IJ SOLN
4.0000 mg | Freq: Once | INTRAMUSCULAR | Status: DC
  Filled 2012-11-29: qty 1

## 2012-11-29 MED ORDER — MORPHINE SULFATE 4 MG/ML IJ SOLN
4.0000 mg | Freq: Once | INTRAMUSCULAR | Status: AC
  Administered 2012-11-29: 4 mg via INTRAMUSCULAR

## 2012-11-29 NOTE — ED Provider Notes (Signed)
History     CSN: [id number]  Arrival date & time 11/29/12  0350   First MD Initiated Contact with Patient 11/29/12 0354      Chief Complaint  Patient presents with  . Back Pain    (Consider location/radiation/quality/duration/timing/severity/associated sxs/prior treatment) HPI Comments: Patient with history of chronic back pain, knee replacements, in emergency department this morning with her 70 year old son who has had seizures and has a history of MR. 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 Patient states that today she had a fall from standing while she was outside. She fell onto her knees. She needed assistance to get to her feet. She did not hit her head. Patient currently complains of bilateral knee soreness. She also complains of lower back pain similar to her chronic back pain that is now aggravated. Patient denies red flag signs and symptoms of lower back pain. Patient states that she takes Ultram at home for chronic pain. She is requesting an injection of morphine which she states will improve her pain and allow the Ultram to work. Onset of symptoms is insidious. Course is constant. Movement makes the pain worse. Nothing makes it better.  Patient is a 72 y.o. female presenting with back pain. The history is provided by the patient.  Back Pain Associated symptoms: no dysuria, no fever, no numbness, no pelvic pain and no weakness     Past Medical History  Diagnosis Date  . Depression   . Schizophrenia   . Arthritis   . Hyperlipidemia   . Hypertension   . Chronic pain   . 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 DVT (deep venous thrombosis)     per 01/17/10 d/c summary- "remote hx of dvt"?  . Iron deficiency anemia   . Chronic headaches   . Sleep apnea   . Glaucoma(365)   . Hemorrhoids   . Asthma   . Anxiety   . GERD (gastroesophageal reflux disease)   . Psoriasis   . Psoriasis 04/29/2012    New onset evaluated  by Dr Marylou Flesher, Dermatology, [facility] 6/13  Rx 0.1% Tacrolimus ointment    Past Surgical History  Procedure Laterality Date  . Spinal fusion      2004  . Joint replacement  right knee 11/2008  . Tubal ligation  year 37  . Carpal tunnel release      bilateral     Family History  Problem Relation Age of Onset  . Diabetes Sister   . Colon cancer Brother 40    History  Substance Use Topics  . Smoking status: Never Smoker   . Smokeless tobacco: Never Used  . Alcohol Use: No    OB History   Grav Para Term Preterm Abortions TAB SAB Ect Mult Living                  Review of Systems  Constitutional: Negative for fever, activity change and unexpected weight change.  HENT: Negative for neck pain.   Gastrointestinal: Negative for constipation.       Negative for fecal incontinence.   Genitourinary: Negative for dysuria, hematuria, flank pain, vaginal bleeding, vaginal discharge and pelvic pain.       Negative for urinary incontinence or retention.  Musculoskeletal: Positive for back pain and arthralgias. Negative for joint swelling.  Skin: Negative for wound.  Neurological: Negative for weakness and numbness.  Denies saddle paresthesias.    Allergies  Adhesive  Home Medications   Current Outpatient Rx  Name  Route  Sig  Dispense  Refill  . albuterol (VENTOLIN HFA) 108 (90 BASE) MCG/ACT inhaler   Inhalation   Inhale 2 puffs into the lungs 2 (two) times daily as needed. For shortness of breath and wheezing         . atenolol (TENORMIN) 100 MG tablet   Oral   Take 100 mg by mouth every morning.          . camphor-menthol (SARNA) lotion   Topical   Apply 1 application topically 2 (two) times daily.         . clonazePAM (KLONOPIN) 0.5 MG tablet   Oral   Take 1.5 mg by mouth every morning. For anxiety         . esomeprazole (NEXIUM) 40 MG capsule   Oral   Take 40 mg by mouth daily before breakfast.          . ferrous sulfate 325 (65 FE)  MG tablet   Oral   Take 325 mg by mouth daily with breakfast.          . FLUoxetine (PROZAC) 20 MG capsule   Oral   Take 60 mg by mouth every morning.          . fluticasone (FLOVENT HFA) 220 MCG/ACT inhaler   Inhalation   Inhale 1 puff into the lungs daily.          Marland Kitchen gabapentin (NEURONTIN) 100 MG capsule   Oral   Take 100 mg by mouth 3 (three) times daily.          . hydrochlorothiazide 25 MG tablet   Oral   Take 25 mg by mouth every morning. For high blood pressure         . hydrOXYzine (ATARAX/VISTARIL) 10 MG tablet   Oral   Take 10 mg by mouth at bedtime.          . meclizine (ANTIVERT) 50 MG tablet   Oral   Take 25 mg by mouth 3 (three) times daily as needed. vertigo         . methocarbamol (ROBAXIN) 500 MG tablet   Oral   Take 1 tablet (500 mg total) by mouth 3 (three) times daily.   30 tablet   0   . naproxen sodium (ANAPROX) 220 MG tablet   Oral   Take 440 mg by mouth 3 (three) times daily with meals.         Marland Kitchen PRESCRIPTION MEDICATION   Topical   Apply 1 application topically 2 (two) times daily. Cream compounded at [facility]  triamcinolone 0.1% 3 parts, silver sulfadiazine 1% 1 part Applies to entire body after a bath         . tacrolimus (PROTOPIC) 0.1 % ointment   Topical   Apply 1 application topically 2 (two) times daily as needed. psoriasis         . traMADol (ULTRAM) 50 MG tablet   Oral   Take 1-2 tablets (50-100 mg total) by mouth 3 (three) times daily as needed for pain.   30 tablet   0   . triamcinolone cream (KENALOG) 0.1 %   Topical   Apply 1 application topically 2 (two) times daily as needed. Entire body psoriasis         . warfarin (COUMADIN) 5 MG tablet   Oral   Take 5 mg by mouth  every morning.            BP 130/75  Pulse 71  Temp(Src) 97.5 F (36.4 C)  Resp 18  SpO2 94%  LMP 11/22/1996  Physical Exam  Nursing note and vitals reviewed. Constitutional: She appears well-developed and  well-nourished.  HENT:  Head: Normocephalic and atraumatic.  Eyes: Conjunctivae are normal.  Neck: Normal range of motion. Neck supple.  Pulmonary/Chest: Effort normal.  Abdominal: Soft. There is no tenderness. There is no CVA tenderness.  Musculoskeletal: Normal range of motion.       Right hip: Normal.       Left hip: Normal.       Right knee: She exhibits normal range of motion, no swelling and no effusion. Tenderness (Generalized) found.       Left knee: She exhibits normal range of motion, no swelling and no effusion. Tenderness (generalized) found.       Right ankle: Normal.       Left ankle: Normal.       Cervical back: She exhibits no tenderness and no bony tenderness.       Thoracic back: She exhibits no tenderness and no bony tenderness.       Lumbar back: She exhibits tenderness. She exhibits no bony tenderness.       Back:  No step-off noted with palpation of spine.   Neurological: She is alert. She has normal strength and normal reflexes. No sensory deficit.  5/5 strength in entire lower extremities bilaterally. No sensation deficit.   Skin: Skin is warm and dry. No rash noted.  Psychiatric: She has a normal mood and affect.    ED Course  Procedures (including critical care time)  Labs Reviewed - No data to display No results found.   1. Muscle strain    5:28 AM Patient seen and examined. Medications ordered. D/w Dr. Rhunette Croft.   Vital signs reviewed and are as follows: Filed Vitals:   11/29/12 0400  BP: 130/75  Pulse: 71  Temp: 97.5 F (36.4 C)  Resp: 18    No red flag s/s of low back pain. Patient was counseled on back pain precautions and told to do activity as tolerated but do not lift, push, or pull heavy objects more than 10 pounds for the next week.  Patient counseled to use ice or heat on back for no longer than 15 minutes every hour.   Pt ambulatory in hall and room.   Patient urged to follow-up with PCP if pain does not improve with treatment  and rest or if pain becomes recurrent. Urged to return with worsening severe pain, loss of bowel or bladder control, trouble walking.   The patient verbalizes understanding and agrees with the plan.   MDM  Patient with chronic back pain. She is ambulatory without difficulty. Do not suspect fracture. No red flag signs and symptoms of lower back pain. Do not suspect cauda equina syndrome. Do not suspect central cord injury. She only checked in because her son is a patient in ED. Do not feel imaging is necessary at this time.         Renne Crigler, PA-C 11/29/12 [id number]

## 2012-11-29 NOTE — ED Notes (Signed)
Pt states chronic back pain exacerbated by fall, with bilateral knee pain.

## 2012-11-30 NOTE — ED Provider Notes (Signed)
Medical screening examination/treatment/procedure(s) were performed by non-physician practitioner and as supervising physician I was immediately available for consultation/collaboration.  Derwood Kaplan, MD 11/30/12 [phone]

## 2012-12-22 ENCOUNTER — Encounter (HOSPITAL_COMMUNITY): Payer: Self-pay | Admitting: Emergency Medicine

## 2012-12-22 ENCOUNTER — Emergency Department (HOSPITAL_COMMUNITY)
Admission: EM | Admit: 2012-12-22 | Discharge: 2012-12-22 | Disposition: A | Discharge status: Home / Self Care | Payer: Medicare Other | Attending: Emergency Medicine | Admitting: Emergency Medicine

## 2012-12-22 DIAGNOSIS — Z8679 Personal history of other diseases of the circulatory system: Secondary | ICD-10-CM | POA: Insufficient documentation

## 2012-12-22 DIAGNOSIS — I10 Essential (primary) hypertension: Secondary | ICD-10-CM | POA: Insufficient documentation

## 2012-12-22 DIAGNOSIS — K219 Gastro-esophageal reflux disease without esophagitis: Secondary | ICD-10-CM | POA: Insufficient documentation

## 2012-12-22 DIAGNOSIS — Z7901 Long term (current) use of anticoagulants: Secondary | ICD-10-CM | POA: Insufficient documentation

## 2012-12-22 DIAGNOSIS — F209 Schizophrenia, unspecified: Secondary | ICD-10-CM | POA: Insufficient documentation

## 2012-12-22 DIAGNOSIS — Z86718 Personal history of other venous thrombosis and embolism: Secondary | ICD-10-CM | POA: Insufficient documentation

## 2012-12-22 DIAGNOSIS — F411 Generalized anxiety disorder: Secondary | ICD-10-CM | POA: Insufficient documentation

## 2012-12-22 DIAGNOSIS — J45909 Unspecified asthma, uncomplicated: Secondary | ICD-10-CM | POA: Insufficient documentation

## 2012-12-22 DIAGNOSIS — L408 Other psoriasis: Secondary | ICD-10-CM | POA: Insufficient documentation

## 2012-12-22 DIAGNOSIS — Z8669 Personal history of other diseases of the nervous system and sense organs: Secondary | ICD-10-CM | POA: Insufficient documentation

## 2012-12-22 DIAGNOSIS — H409 Unspecified glaucoma: Secondary | ICD-10-CM | POA: Insufficient documentation

## 2012-12-22 DIAGNOSIS — Z86711 Personal history of pulmonary embolism: Secondary | ICD-10-CM | POA: Insufficient documentation

## 2012-12-22 DIAGNOSIS — Z862 Personal history of diseases of the blood and blood-forming organs and certain disorders involving the immune mechanism: Secondary | ICD-10-CM | POA: Insufficient documentation

## 2012-12-22 DIAGNOSIS — M549 Dorsalgia, unspecified: Secondary | ICD-10-CM | POA: Insufficient documentation

## 2012-12-22 DIAGNOSIS — M542 Cervicalgia: Secondary | ICD-10-CM | POA: Insufficient documentation

## 2012-12-22 DIAGNOSIS — M79609 Pain in unspecified limb: Secondary | ICD-10-CM | POA: Insufficient documentation

## 2012-12-22 DIAGNOSIS — D509 Iron deficiency anemia, unspecified: Secondary | ICD-10-CM | POA: Insufficient documentation

## 2012-12-22 DIAGNOSIS — Z79899 Other long term (current) drug therapy: Secondary | ICD-10-CM | POA: Insufficient documentation

## 2012-12-22 DIAGNOSIS — M129 Arthropathy, unspecified: Secondary | ICD-10-CM | POA: Insufficient documentation

## 2012-12-22 DIAGNOSIS — IMO0002 Reserved for concepts with insufficient information to code with codable children: Secondary | ICD-10-CM | POA: Insufficient documentation

## 2012-12-22 DIAGNOSIS — Z8639 Personal history of other endocrine, nutritional and metabolic disease: Secondary | ICD-10-CM | POA: Insufficient documentation

## 2012-12-22 DIAGNOSIS — G8929 Other chronic pain: Secondary | ICD-10-CM | POA: Insufficient documentation

## 2012-12-22 LAB — BASIC METABOLIC PANEL
Calcium: 8.9 mg/dL (ref 8.4–10.5)
GFR calc Af Amer: 84 mL/min — ABNORMAL LOW (ref >90)
GFR calc non Af Amer: 72 mL/min — ABNORMAL LOW (ref >90)
Potassium: 3.6 mEq/L (ref 3.5–5.1)
Sodium: 139 mEq/L (ref 135–145)

## 2012-12-22 LAB — CBC
MCHC: 34.2 g/dL (ref 30.0–36.0)
Platelets: 249 10*3/uL (ref 150–400)
RDW: 14.4 % (ref 11.5–15.5)

## 2012-12-22 LAB — PROTIME-INR: INR: 1.92 — ABNORMAL HIGH (ref 0.00–1.49)

## 2012-12-22 MED ORDER — TRAMADOL HCL 50 MG PO TABS: 50.0000 mg | ORAL_TABLET | Freq: Four times a day (QID) | ORAL | Status: DC | PRN

## 2012-12-22 MED ORDER — FENTANYL CITRATE 0.05 MG/ML IJ SOLN
100.0000 ug | Freq: Once | INTRAMUSCULAR | Status: AC
  Administered 2012-12-22: 100 ug via NASAL
  Filled 2012-12-22: qty 2

## 2012-12-22 NOTE — ED Notes (Signed)
QMV:[id number]<NG> Expected date:12/22/12<BR> Expected time: 7:49 PM<BR> Means of arrival:Ambulance<BR> Comments:<BR> General weakness

## 2012-12-22 NOTE — ED Notes (Signed)
MD at bedside speaking to pt at this time  

## 2012-12-22 NOTE — ED Notes (Signed)
Per EMS, pt hx of psoriasis with pain esp to left leg.  Pt states she has fallen today r/t the pain in her bilateral legs. Pt able to stand to transfer from EMS stretcher to ED stretcher.

## 2012-12-22 NOTE — ED Provider Notes (Signed)
History     CSN: [id number]  Arrival date & time 12/22/12  1950   First MD Initiated Contact with Patient 12/22/12 2142      Chief Complaint  Patient presents with  . Weakness   chronic pain  (Consider location/radiation/quality/duration/timing/severity/associated sxs/prior treatment) HPI This 72 year old female has chronic pain with chronic generalized weakness and ran of her Ultram, she states she will not go back to her primary care doctor anymore, she is making arrangements for a new doctor but does not have a new doctor yet he does not have a chronic pain management Dr., she is typical chronic severe pain to her neck back in all 4 extremities 24 hours a day which is at its baseline today, she is no new weakness and no new pain, she is no trauma no fever no chest pain no shortness breath abdominal pain. She is no new focal weakness or numbness. Past Medical History  Diagnosis Date  . Depression   . Schizophrenia   . Arthritis   . Hyperlipidemia   . Hypertension   . Chronic pain   . 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 DVT (deep venous thrombosis)     per 01/17/10 d/c summary- "remote hx of dvt"?  . Iron deficiency anemia   . Chronic headaches   . Sleep apnea   . Glaucoma(365)   . Hemorrhoids   . Asthma   . Anxiety   . GERD (gastroesophageal reflux disease)   . Psoriasis   . Psoriasis 04/29/2012    New onset evaluated by Dr Marylou Flesher, Dermatology, [facility] 6/13  Rx 0.1% Tacrolimus ointment    Past Surgical History  Procedure Laterality Date  . Spinal fusion      2004  . Joint replacement  right knee 11/2008  . Tubal ligation  year 67  . Carpal tunnel release      bilateral     Family History  Problem Relation Age of Onset  . Diabetes Sister   . Colon cancer Brother 40    History  Substance Use Topics  . Smoking status: Never Smoker   . Smokeless tobacco: Never Used  . Alcohol Use: No    OB History   Grav Para Term  Preterm Abortions TAB SAB Ect Mult Living                  Review of Systems 10 Systems reviewed and are negative for acute change except as noted in the HPI. Allergies  Adhesive  Home Medications   Current Outpatient Rx  Name  Route  Sig  Dispense  Refill  . albuterol (VENTOLIN HFA) 108 (90 BASE) MCG/ACT inhaler   Inhalation   Inhale 2 puffs into the lungs 2 (two) times daily as needed for wheezing or shortness of breath.          Marland Kitchen atenolol (TENORMIN) 100 MG tablet   Oral   Take 100 mg by mouth every morning.          . camphor-menthol (SARNA) lotion   Topical   Apply 1 application topically 2 (two) times daily.         . clonazePAM (KLONOPIN) 0.5 MG tablet   Oral   Take 1.5 mg by mouth 3 (three) times daily as needed for anxiety.          Marland Kitchen esomeprazole (NEXIUM) 40 MG capsule   Oral   Take 40 mg by mouth daily before breakfast.          .  ferrous sulfate 325 (65 FE) MG tablet   Oral   Take 325 mg by mouth daily with breakfast.          . FLUoxetine (PROZAC) 20 MG capsule   Oral   Take 60 mg by mouth every morning.          . fluticasone (FLOVENT HFA) 220 MCG/ACT inhaler   Inhalation   Inhale 1 puff into the lungs 2 (two) times daily.          Marland Kitchen gabapentin (NEURONTIN) 100 MG capsule   Oral   Take 100 mg by mouth 3 (three) times daily.          . hydrochlorothiazide 25 MG tablet   Oral   Take 25 mg by mouth every morning.          . hydrOXYzine (ATARAX/VISTARIL) 10 MG tablet   Oral   Take 10 mg by mouth at bedtime as needed for itching.          . meclizine (ANTIVERT) 25 MG tablet   Oral   Take 25 mg by mouth 3 (three) times daily as needed for dizziness.         Marland Kitchen PRESCRIPTION MEDICATION   Topical   Apply 1 application topically 2 (two) times daily. Cream compounded at [facility]  triamcinolone 0.1% 3 parts, silver sulfadiazine 1% 1 part Applies to entire body after a bath         . tacrolimus (PROTOPIC) 0.1 % ointment    Topical   Apply 1 application topically 2 (two) times daily as needed (Psoriasis).          . triamcinolone cream (KENALOG) 0.1 %   Topical   Apply 1 application topically 2 (two) times daily as needed. Entire body psoriasis         . warfarin (COUMADIN) 5 MG tablet   Oral   Take 5 mg by mouth every morning.          . hydrochlorothiazide (HYDRODIURIL) 25 MG tablet   Oral   Take 1 tablet (25 mg total) by mouth daily.   30 tablet   0   . traMADol (ULTRAM) 50 MG tablet   Oral   Take 1 tablet (50 mg total) by mouth every 8 (eight) hours as needed for pain.   30 tablet   0     BP 165/84  Pulse 59  Temp(Src) 98.3 F (36.8 C) (Oral)  Resp 20  SpO2 97%  LMP 11/22/1996  Physical Exam  Nursing note and vitals reviewed. Constitutional:  Awake, alert, nontoxic appearance.  HENT:  Head: Atraumatic.  Eyes: Right eye exhibits no discharge. Left eye exhibits no discharge.  Neck: Neck supple.  Neck is tender patient states at baseline  Cardiovascular: Normal rate and regular rhythm.   No murmur heard. Pulmonary/Chest: Effort normal and breath sounds normal. No respiratory distress. She has no wheezes. She has no rales. She exhibits no tenderness.  Abdominal: Soft. There is no tenderness. There is no rebound.  Musculoskeletal: She exhibits tenderness. She exhibits no edema.  Baseline ROM, no obvious new focal weakness. Back and all 4 extremities are diffusely tender which patient states is baseline for her.  Neurological: She is alert.  Mental status and motor strength appears baseline for patient and situation.  Skin: No rash noted.  Psychiatric: She has a normal mood and affect.    ED Course  Procedures (including critical care time)  Labs Reviewed  CBC - Abnormal; Notable  for the following:    RBC 3.57 (*)    Hemoglobin 11.7 (*)    HCT 34.2 (*)    All other components within normal limits  BASIC METABOLIC PANEL - Abnormal; Notable for the following:    Glucose,  Bld 112 (*)    GFR calc non Af Amer 72 (*)    GFR calc Af Amer 84 (*)    All other components within normal limits  PROTIME-INR - Abnormal; Notable for the following:    Prothrombin Time 21.2 (*)    INR 1.92 (*)    All other components within normal limits   No results found.   1. Chronic pain       MDM  I doubt any other EMC precluding discharge at this time including, but not necessarily limited to the following:SBI.        Hurman Horn, MD 01/05/13 [phone]

## 2012-12-22 NOTE — ED Notes (Signed)
[facility] called to transport 

## 2012-12-25 ENCOUNTER — Ambulatory Visit: Payer: Medicare Other

## 2012-12-25 ENCOUNTER — Other Ambulatory Visit: Payer: Medicare Other | Admitting: Lab

## 2012-12-28 ENCOUNTER — Emergency Department (HOSPITAL_COMMUNITY)
Admission: EM | Admit: 2012-12-28 | Discharge: 2012-12-28 | Disposition: A | Discharge status: Home / Self Care | Payer: Medicare Other | Attending: Emergency Medicine | Admitting: Emergency Medicine

## 2012-12-28 ENCOUNTER — Encounter (HOSPITAL_COMMUNITY): Payer: Self-pay | Admitting: Emergency Medicine

## 2012-12-28 DIAGNOSIS — D509 Iron deficiency anemia, unspecified: Secondary | ICD-10-CM | POA: Insufficient documentation

## 2012-12-28 DIAGNOSIS — Z8739 Personal history of other diseases of the musculoskeletal system and connective tissue: Secondary | ICD-10-CM | POA: Insufficient documentation

## 2012-12-28 DIAGNOSIS — F329 Major depressive disorder, single episode, unspecified: Secondary | ICD-10-CM | POA: Insufficient documentation

## 2012-12-28 DIAGNOSIS — Z862 Personal history of diseases of the blood and blood-forming organs and certain disorders involving the immune mechanism: Secondary | ICD-10-CM | POA: Insufficient documentation

## 2012-12-28 DIAGNOSIS — Z760 Encounter for issue of repeat prescription: Secondary | ICD-10-CM | POA: Insufficient documentation

## 2012-12-28 DIAGNOSIS — Z79899 Other long term (current) drug therapy: Secondary | ICD-10-CM | POA: Insufficient documentation

## 2012-12-28 DIAGNOSIS — F3289 Other specified depressive episodes: Secondary | ICD-10-CM | POA: Insufficient documentation

## 2012-12-28 DIAGNOSIS — Z8669 Personal history of other diseases of the nervous system and sense organs: Secondary | ICD-10-CM | POA: Insufficient documentation

## 2012-12-28 DIAGNOSIS — IMO0002 Reserved for concepts with insufficient information to code with codable children: Secondary | ICD-10-CM | POA: Insufficient documentation

## 2012-12-28 DIAGNOSIS — K219 Gastro-esophageal reflux disease without esophagitis: Secondary | ICD-10-CM | POA: Insufficient documentation

## 2012-12-28 DIAGNOSIS — F209 Schizophrenia, unspecified: Secondary | ICD-10-CM | POA: Insufficient documentation

## 2012-12-28 DIAGNOSIS — G8929 Other chronic pain: Secondary | ICD-10-CM | POA: Insufficient documentation

## 2012-12-28 DIAGNOSIS — Z8679 Personal history of other diseases of the circulatory system: Secondary | ICD-10-CM | POA: Insufficient documentation

## 2012-12-28 DIAGNOSIS — Z7901 Long term (current) use of anticoagulants: Secondary | ICD-10-CM | POA: Insufficient documentation

## 2012-12-28 DIAGNOSIS — Z86718 Personal history of other venous thrombosis and embolism: Secondary | ICD-10-CM | POA: Insufficient documentation

## 2012-12-28 DIAGNOSIS — F411 Generalized anxiety disorder: Secondary | ICD-10-CM | POA: Insufficient documentation

## 2012-12-28 DIAGNOSIS — Z86711 Personal history of pulmonary embolism: Secondary | ICD-10-CM | POA: Insufficient documentation

## 2012-12-28 DIAGNOSIS — Z8639 Personal history of other endocrine, nutritional and metabolic disease: Secondary | ICD-10-CM | POA: Insufficient documentation

## 2012-12-28 DIAGNOSIS — J45909 Unspecified asthma, uncomplicated: Secondary | ICD-10-CM | POA: Insufficient documentation

## 2012-12-28 DIAGNOSIS — H409 Unspecified glaucoma: Secondary | ICD-10-CM | POA: Insufficient documentation

## 2012-12-28 DIAGNOSIS — Z981 Arthrodesis status: Secondary | ICD-10-CM | POA: Insufficient documentation

## 2012-12-28 DIAGNOSIS — I10 Essential (primary) hypertension: Secondary | ICD-10-CM | POA: Insufficient documentation

## 2012-12-28 MED ORDER — HYDROCHLOROTHIAZIDE 25 MG PO TABS: 25.0000 mg | ORAL_TABLET | Freq: Every day | ORAL | Status: DC

## 2012-12-28 MED ORDER — OXYCODONE-ACETAMINOPHEN 5-325 MG PO TABS
1.0000 | ORAL_TABLET | Freq: Once | ORAL | Status: AC
  Administered 2012-12-28: 1 via ORAL
  Filled 2012-12-28: qty 1

## 2012-12-28 MED ORDER — TRAMADOL HCL 50 MG PO TABS: 50.0000 mg | ORAL_TABLET | Freq: Three times a day (TID) | ORAL | Status: DC | PRN

## 2012-12-28 NOTE — ED Provider Notes (Signed)
History     CSN: [id number]  Arrival date & time 12/28/12  1123   First MD Initiated Contact with Patient 12/28/12 1126      Chief Complaint  Patient presents with  . Psoriasis    The history is provided by the patient and medical records.   the patient has long-standing history of chronic pain.  She only takes tramadol for her pain.  She currently is without a primary care physician and is currently filling out the paperwork to see a new physician at [facility]  She does not have this appointment scheduled yet but expects in the next couple weeks.  She reports ongoing pain.  She only takes tramadol for pain.  She is currently out.  She also requests medication refill for hydrochlorothiazide.  No chest pain shortness of breath.  He appears chills.  No abdominal pain.  No other complaints the  Past Medical History  Diagnosis Date  . Depression   . Schizophrenia   . Arthritis   . Hyperlipidemia   . Hypertension   . Chronic pain   . 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 DVT (deep venous thrombosis)     per 01/17/10 d/c summary- "remote hx of dvt"?  . Iron deficiency anemia   . Chronic headaches   . Sleep apnea   . Glaucoma(365)   . Hemorrhoids   . Asthma   . Anxiety   . GERD (gastroesophageal reflux disease)   . Psoriasis   . Psoriasis 04/29/2012    New onset evaluated by Dr Marylou Flesher, Dermatology, [facility] 6/13  Rx 0.1% Tacrolimus ointment    Past Surgical History  Procedure Laterality Date  . Spinal fusion      2004  . Joint replacement  right knee 11/2008  . Tubal ligation  year 30  . Carpal tunnel release      bilateral     Family History  Problem Relation Age of Onset  . Diabetes Sister   . Colon cancer Brother 40    History  Substance Use Topics  . Smoking status: Never Smoker   . Smokeless tobacco: Never Used  . Alcohol Use: No    OB History   Grav Para Term Preterm Abortions TAB SAB Ect  Mult Living                  Review of Systems  All other systems reviewed and are negative.    Allergies  Adhesive  Home Medications   Current Outpatient Rx  Name  Route  Sig  Dispense  Refill  . albuterol (VENTOLIN HFA) 108 (90 BASE) MCG/ACT inhaler   Inhalation   Inhale 2 puffs into the lungs 2 (two) times daily as needed for wheezing or shortness of breath.          Marland Kitchen atenolol (TENORMIN) 100 MG tablet   Oral   Take 100 mg by mouth every morning.          . camphor-menthol (SARNA) lotion   Topical   Apply 1 application topically 2 (two) times daily.         . clonazePAM (KLONOPIN) 0.5 MG tablet   Oral   Take 1.5 mg by mouth 3 (three) times daily as needed for anxiety.          Marland Kitchen esomeprazole (NEXIUM) 40 MG capsule   Oral   Take 40 mg by mouth daily before breakfast.          .  ferrous sulfate 325 (65 FE) MG tablet   Oral   Take 325 mg by mouth daily with breakfast.          . FLUoxetine (PROZAC) 20 MG capsule   Oral   Take 60 mg by mouth every morning.          . fluticasone (FLOVENT HFA) 220 MCG/ACT inhaler   Inhalation   Inhale 1 puff into the lungs 2 (two) times daily.          Marland Kitchen gabapentin (NEURONTIN) 100 MG capsule   Oral   Take 100 mg by mouth 3 (three) times daily.          . hydrochlorothiazide 25 MG tablet   Oral   Take 25 mg by mouth every morning.          . hydrOXYzine (ATARAX/VISTARIL) 10 MG tablet   Oral   Take 10 mg by mouth at bedtime as needed for itching.          . meclizine (ANTIVERT) 25 MG tablet   Oral   Take 25 mg by mouth 3 (three) times daily as needed for dizziness.         Marland Kitchen PRESCRIPTION MEDICATION   Topical   Apply 1 application topically 2 (two) times daily. Cream compounded at [facility]  triamcinolone 0.1% 3 parts, silver sulfadiazine 1% 1 part Applies to entire body after a bath         . tacrolimus (PROTOPIC) 0.1 % ointment   Topical   Apply 1 application topically 2 (two) times daily  as needed (Psoriasis).          . triamcinolone cream (KENALOG) 0.1 %   Topical   Apply 1 application topically 2 (two) times daily as needed. Entire body psoriasis         . warfarin (COUMADIN) 5 MG tablet   Oral   Take 5 mg by mouth every morning.          . hydrochlorothiazide (HYDRODIURIL) 25 MG tablet   Oral   Take 1 tablet (25 mg total) by mouth daily.   30 tablet   0   . traMADol (ULTRAM) 50 MG tablet   Oral   Take 1 tablet (50 mg total) by mouth every 8 (eight) hours as needed for pain.   30 tablet   0     BP 179/99  Pulse 77  Temp(Src) 97.8 F (36.6 C) (Oral)  Resp 22  SpO2 95%  LMP 11/22/1996  Physical Exam  Nursing note and vitals reviewed. Constitutional: She is oriented to person, place, and time. She appears well-developed and well-nourished. No distress.  HENT:  Head: Normocephalic and atraumatic.  Eyes: EOM are normal.  Neck: Normal range of motion.  Cardiovascular: Normal rate, regular rhythm and normal heart sounds.   Pulmonary/Chest: Effort normal and breath sounds normal.  Abdominal: Soft. She exhibits no distension. There is no tenderness.  Musculoskeletal: Normal range of motion.  Neurological: She is alert and oriented to person, place, and time.  Skin: Skin is warm and dry.  Psychiatric: She has a normal mood and affect. Judgment normal.    ED Course  Procedures (including critical care time)  Labs Reviewed - No data to display No results found.   1. Chronic pain   2. Hypertension   3. Medication refill       MDM  Medication refill.  Chronic pain exacerbation.  Scheduled followup with the primary care physician in the upcoming weeks  Lyanne Co, MD 12/28/12 1226

## 2012-12-28 NOTE — ED Notes (Addendum)
Pt states she is having pain from her psoriasis. Pain is 10/10 in her legs.  She is out of her pain medication. States the "psoriasis got into my veins and that's what's causing the pain."  BP 162/110, HR 80, RR16. Pt is out of her HTN meds also.

## 2012-12-31 ENCOUNTER — Other Ambulatory Visit: Payer: Medicare Other | Admitting: Lab

## 2012-12-31 ENCOUNTER — Ambulatory Visit: Payer: Medicare Other

## 2013-01-05 ENCOUNTER — Ambulatory Visit: Payer: Medicare Other

## 2013-01-05 ENCOUNTER — Other Ambulatory Visit: Payer: Medicare Other | Admitting: Lab

## 2013-01-09 ENCOUNTER — Other Ambulatory Visit (HOSPITAL_BASED_OUTPATIENT_CLINIC_OR_DEPARTMENT_OTHER): Payer: Medicare Other | Admitting: Lab

## 2013-01-09 ENCOUNTER — Emergency Department (HOSPITAL_COMMUNITY)
Admission: EM | Admit: 2013-01-09 | Discharge: 2013-01-09 | Disposition: A | Discharge status: Home / Self Care | Payer: Medicare Other | Attending: Emergency Medicine | Admitting: Emergency Medicine

## 2013-01-09 ENCOUNTER — Ambulatory Visit (HOSPITAL_BASED_OUTPATIENT_CLINIC_OR_DEPARTMENT_OTHER): Payer: Medicare Other | Admitting: Pharmacist

## 2013-01-09 ENCOUNTER — Encounter (HOSPITAL_COMMUNITY): Payer: Self-pay | Admitting: Emergency Medicine

## 2013-01-09 DIAGNOSIS — L408 Other psoriasis: Secondary | ICD-10-CM | POA: Insufficient documentation

## 2013-01-09 DIAGNOSIS — I2699 Other pulmonary embolism without acute cor pulmonale: Secondary | ICD-10-CM

## 2013-01-09 DIAGNOSIS — I10 Essential (primary) hypertension: Secondary | ICD-10-CM | POA: Insufficient documentation

## 2013-01-09 DIAGNOSIS — Z760 Encounter for issue of repeat prescription: Secondary | ICD-10-CM | POA: Insufficient documentation

## 2013-01-09 DIAGNOSIS — K219 Gastro-esophageal reflux disease without esophagitis: Secondary | ICD-10-CM | POA: Insufficient documentation

## 2013-01-09 DIAGNOSIS — Z7901 Long term (current) use of anticoagulants: Secondary | ICD-10-CM

## 2013-01-09 DIAGNOSIS — D6859 Other primary thrombophilia: Secondary | ICD-10-CM

## 2013-01-09 DIAGNOSIS — M25579 Pain in unspecified ankle and joints of unspecified foot: Secondary | ICD-10-CM | POA: Insufficient documentation

## 2013-01-09 DIAGNOSIS — G8929 Other chronic pain: Secondary | ICD-10-CM

## 2013-01-09 DIAGNOSIS — M129 Arthropathy, unspecified: Secondary | ICD-10-CM | POA: Insufficient documentation

## 2013-01-09 DIAGNOSIS — IMO0002 Reserved for concepts with insufficient information to code with codable children: Secondary | ICD-10-CM | POA: Insufficient documentation

## 2013-01-09 DIAGNOSIS — L409 Psoriasis, unspecified: Secondary | ICD-10-CM

## 2013-01-09 DIAGNOSIS — H409 Unspecified glaucoma: Secondary | ICD-10-CM | POA: Insufficient documentation

## 2013-01-09 DIAGNOSIS — D509 Iron deficiency anemia, unspecified: Secondary | ICD-10-CM | POA: Insufficient documentation

## 2013-01-09 DIAGNOSIS — Z86718 Personal history of other venous thrombosis and embolism: Secondary | ICD-10-CM | POA: Insufficient documentation

## 2013-01-09 DIAGNOSIS — M25539 Pain in unspecified wrist: Secondary | ICD-10-CM | POA: Insufficient documentation

## 2013-01-09 DIAGNOSIS — Z8679 Personal history of other diseases of the circulatory system: Secondary | ICD-10-CM | POA: Insufficient documentation

## 2013-01-09 DIAGNOSIS — F411 Generalized anxiety disorder: Secondary | ICD-10-CM | POA: Insufficient documentation

## 2013-01-09 DIAGNOSIS — Z79899 Other long term (current) drug therapy: Secondary | ICD-10-CM | POA: Insufficient documentation

## 2013-01-09 DIAGNOSIS — M25549 Pain in joints of unspecified hand: Secondary | ICD-10-CM | POA: Insufficient documentation

## 2013-01-09 DIAGNOSIS — J45909 Unspecified asthma, uncomplicated: Secondary | ICD-10-CM | POA: Insufficient documentation

## 2013-01-09 DIAGNOSIS — F209 Schizophrenia, unspecified: Secondary | ICD-10-CM | POA: Insufficient documentation

## 2013-01-09 DIAGNOSIS — R51 Headache: Secondary | ICD-10-CM | POA: Insufficient documentation

## 2013-01-09 DIAGNOSIS — Z8639 Personal history of other endocrine, nutritional and metabolic disease: Secondary | ICD-10-CM | POA: Insufficient documentation

## 2013-01-09 DIAGNOSIS — Z862 Personal history of diseases of the blood and blood-forming organs and certain disorders involving the immune mechanism: Secondary | ICD-10-CM | POA: Insufficient documentation

## 2013-01-09 DIAGNOSIS — D6851 Activated protein C resistance: Secondary | ICD-10-CM

## 2013-01-09 DIAGNOSIS — Z86711 Personal history of pulmonary embolism: Secondary | ICD-10-CM | POA: Insufficient documentation

## 2013-01-09 LAB — PROTIME-INR: Protime: 21.6 Seconds — ABNORMAL HIGH (ref 10.6–13.4)

## 2013-01-09 MED ORDER — MORPHINE SULFATE 4 MG/ML IJ SOLN
4.0000 mg | Freq: Once | INTRAMUSCULAR | Status: AC
  Administered 2013-01-09: 4 mg via INTRAMUSCULAR
  Filled 2013-01-09: qty 1

## 2013-01-09 MED ORDER — TRAMADOL HCL 50 MG PO TABS: 50.0000 mg | ORAL_TABLET | Freq: Four times a day (QID) | ORAL | Status: DC | PRN

## 2013-01-09 MED ORDER — ONDANSETRON 4 MG PO TBDP
4.0000 mg | ORAL_TABLET | Freq: Once | ORAL | Status: AC
  Administered 2013-01-09: 4 mg via ORAL
  Filled 2013-01-09: qty 1

## 2013-01-09 NOTE — Patient Instructions (Addendum)
INR 1.8 today No changes  Continue same dose of 5 mg daily  Return in 1 month on 02/20/13 at 2pm

## 2013-01-09 NOTE — ED Provider Notes (Signed)
History     CSN: [id number]  Arrival date & time 01/09/13  1426   First MD Initiated Contact with Patient 01/09/13 1431      Chief Complaint  Patient presents with  . Hand Pain  . Wrist Pain  . Joint Pain    pt report chronic joint pain and swelling    (Consider location/radiation/quality/duration/timing/severity/associated sxs/prior treatment) HPI Comments: Patient presents emergency department with chief complaint of chronic pain. She's been seen here multiple times for the same. She states that she has psoriasis, and that she has frequent flareups. She has been advised to get a primary care doctor, which today, she states that she has done so. She states that she has followup with a new primary care doctor on May 6th. She is hoping that he can help her get in to see a chronic pain doctor. Today she is asking for a shot of pain medicine, and refill of her tramadol. She states that she has not had any mechanism of injury, but that her ankles and wrists are sore. She states that there is nothing new about this pain, and that it is her typical arthritic pain. She states the pain is moderate. Does not radiate. She denies any fevers, chills, nausea, vomiting, diarrhea, constipation.  The history is provided by the patient. No language interpreter was used.    Past Medical History  Diagnosis Date  . Depression   . Schizophrenia   . Arthritis   . Hyperlipidemia   . Hypertension   . Chronic pain   . 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 DVT (deep venous thrombosis)     per 01/17/10 d/c summary- "remote hx of dvt"?  . Iron deficiency anemia   . Chronic headaches   . Sleep apnea   . Glaucoma(365)   . Hemorrhoids   . Asthma   . Anxiety   . GERD (gastroesophageal reflux disease)   . Psoriasis   . Psoriasis 04/29/2012    New onset evaluated by Dr Marylou Flesher, Dermatology, [facility] 6/13  Rx 0.1% Tacrolimus ointment    Past Surgical History  Procedure  Laterality Date  . Spinal fusion      2004  . Joint replacement  right knee 11/2008  . Tubal ligation  year 10  . Carpal tunnel release      bilateral     Family History  Problem Relation Age of Onset  . Diabetes Sister   . Colon cancer Brother 40    History  Substance Use Topics  . Smoking status: Never Smoker   . Smokeless tobacco: Never Used  . Alcohol Use: No    OB History   Grav Para Term Preterm Abortions TAB SAB Ect Mult Living                  Review of Systems  All other systems reviewed and are negative.    Allergies  Adhesive  Home Medications   Current Outpatient Rx  Name  Route  Sig  Dispense  Refill  . albuterol (VENTOLIN HFA) 108 (90 BASE) MCG/ACT inhaler   Inhalation   Inhale 2 puffs into the lungs 2 (two) times daily as needed for wheezing or shortness of breath.          Marland Kitchen atenolol (TENORMIN) 100 MG tablet   Oral   Take 100 mg by mouth every morning.          . camphor-menthol (SARNA) lotion  Topical   Apply 1 application topically 2 (two) times daily.         . clonazePAM (KLONOPIN) 0.5 MG tablet   Oral   Take 1.5 mg by mouth 3 (three) times daily as needed for anxiety.          Marland Kitchen esomeprazole (NEXIUM) 40 MG capsule   Oral   Take 40 mg by mouth daily before breakfast.          . ferrous sulfate 325 (65 FE) MG tablet   Oral   Take 325 mg by mouth daily with breakfast.          . FLUoxetine (PROZAC) 20 MG capsule   Oral   Take 60 mg by mouth every morning.          . fluticasone (FLOVENT HFA) 220 MCG/ACT inhaler   Inhalation   Inhale 1 puff into the lungs 2 (two) times daily.          Marland Kitchen gabapentin (NEURONTIN) 100 MG capsule   Oral   Take 100 mg by mouth 3 (three) times daily.          . hydrochlorothiazide (HYDRODIURIL) 25 MG tablet   Oral   Take 1 tablet (25 mg total) by mouth daily.   30 tablet   0   . hydrochlorothiazide 25 MG tablet   Oral   Take 25 mg by mouth every morning.          .  hydrOXYzine (ATARAX/VISTARIL) 10 MG tablet   Oral   Take 10 mg by mouth at bedtime as needed for itching.          . meclizine (ANTIVERT) 25 MG tablet   Oral   Take 25 mg by mouth 3 (three) times daily as needed for dizziness.         Marland Kitchen PRESCRIPTION MEDICATION   Topical   Apply 1 application topically 2 (two) times daily. Cream compounded at [name]  triamcinolone 0.1% 3 parts, silver sulfadiazine 1% 1 part Applies to entire body after a bath         . tacrolimus (PROTOPIC) 0.1 % ointment   Topical   Apply 1 application topically 2 (two) times daily as needed (Psoriasis).          . traMADol (ULTRAM) 50 MG tablet   Oral   Take 1 tablet (50 mg total) by mouth every 8 (eight) hours as needed for pain.   30 tablet   0   . triamcinolone cream (KENALOG) 0.1 %   Topical   Apply 1 application topically 2 (two) times daily as needed. Entire body psoriasis         . warfarin (COUMADIN) 5 MG tablet   Oral   Take 5 mg by mouth every morning.            LMP 11/22/1996  Physical Exam  Nursing note and vitals reviewed. Constitutional: She is oriented to person, place, and time. She appears well-developed and well-nourished.  HENT:  Head: Normocephalic and atraumatic.  Eyes: Conjunctivae and EOM are normal. Pupils are equal, round, and reactive to light.  Neck: Normal range of motion. Neck supple.  Cardiovascular: Normal rate and regular rhythm.  Exam reveals no gallop and no friction rub.   No murmur heard. Pulmonary/Chest: Effort normal and breath sounds normal. No respiratory distress. She has no wheezes. She has no rales. She exhibits no tenderness.  Abdominal: Soft. Bowel sounds are normal. She exhibits no distension and no  mass. There is no tenderness. There is no rebound and no guarding.  Musculoskeletal: Normal range of motion. She exhibits no edema and no tenderness.  No bony tenderness to the ankles or wrists, no gross abnormality or deformity  Neurological:  She is alert and oriented to person, place, and time.  Skin: Skin is warm and dry.  Psychiatric: She has a normal mood and affect. Her behavior is normal. Judgment and thought content normal.    ED Course  Procedures (including critical care time)  Labs Reviewed - No data to display No results found.   1. Psoriasis   2. Chronic pain       MDM   Patient with chronic pain, and psoriasis. She has been seen multiple times in the past. We'll give the patient a shot of pain medicine and Zofran. She has recently established new followup with a primary care provider. I will refill her tramadol, this should last until she sees her new primary care provider to beginning of May. Patient is stable and ready for discharge.       Roxy Horseman, PA-C 01/09/13 [phone]

## 2013-01-09 NOTE — ED Notes (Signed)
Pt reports chronic joint pain, all extremeties. PA at bedside

## 2013-01-09 NOTE — Progress Notes (Signed)
INR slightly below goal at 1.8 but appears to be stable on this dose. No missed doses or diet changes. Pt reports no bleeding or bruising or any other complaints besides chronic pain (this is not new). Instructed patient to continue 5 mg daily and to return in 1 month. Pt stated she would like an appointment at the end of May. Set up appointment for 02/20/13 at 2pm for lab and 2:15pm for coumadin clinic

## 2013-01-10 NOTE — ED Provider Notes (Signed)
Medical screening examination/treatment/procedure(s) were performed by non-physician practitioner and as supervising physician I was immediately available for consultation/collaboration.  [name], MD 01/10/13 [phone]

## 2013-01-21 ENCOUNTER — Emergency Department (HOSPITAL_COMMUNITY): Payer: Medicare Other

## 2013-01-21 ENCOUNTER — Emergency Department (HOSPITAL_COMMUNITY)
Admission: EM | Admit: 2013-01-21 | Discharge: 2013-01-21 | Disposition: A | Discharge status: Home / Self Care | Payer: Medicare Other | Attending: Emergency Medicine | Admitting: Emergency Medicine

## 2013-01-21 DIAGNOSIS — F411 Generalized anxiety disorder: Secondary | ICD-10-CM | POA: Insufficient documentation

## 2013-01-21 DIAGNOSIS — H539 Unspecified visual disturbance: Secondary | ICD-10-CM | POA: Insufficient documentation

## 2013-01-21 DIAGNOSIS — I10 Essential (primary) hypertension: Secondary | ICD-10-CM | POA: Insufficient documentation

## 2013-01-21 DIAGNOSIS — R51 Headache: Secondary | ICD-10-CM | POA: Insufficient documentation

## 2013-01-21 DIAGNOSIS — Z8739 Personal history of other diseases of the musculoskeletal system and connective tissue: Secondary | ICD-10-CM | POA: Insufficient documentation

## 2013-01-21 DIAGNOSIS — F209 Schizophrenia, unspecified: Secondary | ICD-10-CM | POA: Insufficient documentation

## 2013-01-21 DIAGNOSIS — R110 Nausea: Secondary | ICD-10-CM | POA: Insufficient documentation

## 2013-01-21 DIAGNOSIS — G8929 Other chronic pain: Secondary | ICD-10-CM | POA: Insufficient documentation

## 2013-01-21 DIAGNOSIS — F3289 Other specified depressive episodes: Secondary | ICD-10-CM | POA: Insufficient documentation

## 2013-01-21 DIAGNOSIS — Z86711 Personal history of pulmonary embolism: Secondary | ICD-10-CM | POA: Insufficient documentation

## 2013-01-21 DIAGNOSIS — Z8639 Personal history of other endocrine, nutritional and metabolic disease: Secondary | ICD-10-CM | POA: Insufficient documentation

## 2013-01-21 DIAGNOSIS — K219 Gastro-esophageal reflux disease without esophagitis: Secondary | ICD-10-CM | POA: Insufficient documentation

## 2013-01-21 DIAGNOSIS — Z7901 Long term (current) use of anticoagulants: Secondary | ICD-10-CM | POA: Insufficient documentation

## 2013-01-21 DIAGNOSIS — R630 Anorexia: Secondary | ICD-10-CM | POA: Insufficient documentation

## 2013-01-21 DIAGNOSIS — Z8679 Personal history of other diseases of the circulatory system: Secondary | ICD-10-CM | POA: Insufficient documentation

## 2013-01-21 DIAGNOSIS — J45909 Unspecified asthma, uncomplicated: Secondary | ICD-10-CM | POA: Insufficient documentation

## 2013-01-21 DIAGNOSIS — Z872 Personal history of diseases of the skin and subcutaneous tissue: Secondary | ICD-10-CM | POA: Insufficient documentation

## 2013-01-21 DIAGNOSIS — Z8669 Personal history of other diseases of the nervous system and sense organs: Secondary | ICD-10-CM | POA: Insufficient documentation

## 2013-01-21 DIAGNOSIS — Z862 Personal history of diseases of the blood and blood-forming organs and certain disorders involving the immune mechanism: Secondary | ICD-10-CM | POA: Insufficient documentation

## 2013-01-21 DIAGNOSIS — Z79899 Other long term (current) drug therapy: Secondary | ICD-10-CM | POA: Insufficient documentation

## 2013-01-21 DIAGNOSIS — H8110 Benign paroxysmal vertigo, unspecified ear: Secondary | ICD-10-CM | POA: Insufficient documentation

## 2013-01-21 DIAGNOSIS — F329 Major depressive disorder, single episode, unspecified: Secondary | ICD-10-CM | POA: Insufficient documentation

## 2013-01-21 DIAGNOSIS — Z86718 Personal history of other venous thrombosis and embolism: Secondary | ICD-10-CM | POA: Insufficient documentation

## 2013-01-21 DIAGNOSIS — R55 Syncope and collapse: Secondary | ICD-10-CM | POA: Insufficient documentation

## 2013-01-21 LAB — COMPREHENSIVE METABOLIC PANEL
ALT: 10 U/L (ref 0–35)
AST: 21 U/L (ref 0–37)
Albumin: 3.7 g/dL (ref 3.5–5.2)
Alkaline Phosphatase: 122 U/L — ABNORMAL HIGH (ref 39–117)
CO2: 33 mEq/L — ABNORMAL HIGH (ref 19–32)
Chloride: 98 mEq/L (ref 96–112)
Creatinine, Ser: 0.96 mg/dL (ref 0.50–1.10)
GFR calc non Af Amer: 59 mL/min — ABNORMAL LOW (ref >90)
Potassium: 4.3 mEq/L (ref 3.5–5.1)
Total Bilirubin: 0.2 mg/dL — ABNORMAL LOW (ref 0.3–1.2)

## 2013-01-21 LAB — CBC WITH DIFFERENTIAL/PLATELET
Basophils Absolute: 0 10*3/uL (ref 0.0–0.1)
HCT: 37.2 % (ref 36.0–46.0)
Hemoglobin: 12.9 g/dL (ref 12.0–15.0)
Lymphocytes Relative: 21 % (ref 12–46)
Monocytes Absolute: 0.3 10*3/uL (ref 0.1–1.0)
Monocytes Relative: 5 % (ref 3–12)
Neutro Abs: 5 10*3/uL (ref 1.7–7.7)
Neutrophils Relative %: 72 % (ref 43–77)
RDW: 14 % (ref 11.5–15.5)
WBC: 7 10*3/uL (ref 4.0–10.5)

## 2013-01-21 LAB — URINALYSIS, ROUTINE W REFLEX MICROSCOPIC
Bilirubin Urine: NEGATIVE
Glucose, UA: NEGATIVE mg/dL
Hgb urine dipstick: NEGATIVE
Ketones, ur: NEGATIVE mg/dL
Protein, ur: NEGATIVE mg/dL
Urobilinogen, UA: 0.2 mg/dL (ref 0.0–1.0)

## 2013-01-21 LAB — CK TOTAL AND CKMB (NOT AT ARMC)
CK, MB: 2.8 ng/mL (ref 0.3–4.0)
Relative Index: 1.4 (ref 0.0–2.5)

## 2013-01-21 MED ORDER — TRAMADOL HCL 50 MG PO TABS
50.0000 mg | ORAL_TABLET | Freq: Once | ORAL | Status: AC
  Administered 2013-01-21: 50 mg via ORAL
  Filled 2013-01-21: qty 1

## 2013-01-21 MED ORDER — MECLIZINE HCL 25 MG PO TABS: 25.0000 mg | ORAL_TABLET | Freq: Four times a day (QID) | ORAL | Status: DC

## 2013-01-21 MED ORDER — TRAMADOL HCL 50 MG PO TABS: 50.0000 mg | ORAL_TABLET | Freq: Four times a day (QID) | ORAL | Status: DC | PRN

## 2013-01-21 NOTE — ED Notes (Signed)
Per [facility] pt began to have R ear pain 2 weeks ago that has progressively gotten worse and is now extending up to the top of her head.  States a increase in vertigo.  No distress noted.

## 2013-01-21 NOTE — ED Provider Notes (Signed)
History     CSN: [id number]  Arrival date & time 01/21/13  1325   First MD Initiated Contact with Patient 01/21/13 1327      Chief Complaint  Patient presents with  . Otalgia    (Consider location/radiation/quality/duration/timing/severity/associated sxs/prior treatment) HPI Comments: 72 y.o. Female with PMHx of vertigo, DVT/PE, HTN, psoriasis and chronic pain presents complaining of right ear and right temporal pain for the past 2 weeks. Pt states she has lost her balance over the past couple of weeks, "feeling like the whole room spins." She fell unwitnessed last week and states she did black out. Pt states she has been "seeing twinkles" over the past week, too. Pt states she has been putting drops in her ear she got years ago that offers her some relief from the pain. Pt states the pain is localized to the right temporal and scalp area, so tender to touch that she "can't even brush her hair." Pt admits to nausea and having a loss of appetite due to the pain. Denies fever, jaw claudication, blurred vision, shortness of breath, chest pain, leg pain.  Pt does take Coumadin. States she is compliant.    Past Medical History  Diagnosis Date  . Depression   . Schizophrenia   . Arthritis   . Hyperlipidemia   . Hypertension   . Chronic pain   . 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 DVT (deep venous thrombosis)     per 01/17/10 d/c summary- "remote hx of dvt"?  . Iron deficiency anemia   . Chronic headaches   . Sleep apnea   . Glaucoma(365)   . Hemorrhoids   . Asthma   . Anxiety   . GERD (gastroesophageal reflux disease)   . Psoriasis   . Psoriasis 04/29/2012    New onset evaluated by Dr Marylou Flesher, Dermatology, [facility] 6/13  Rx 0.1% Tacrolimus ointment    Past Surgical History  Procedure Laterality Date  . Spinal fusion      2004  . Joint replacement  right knee 11/2008  . Tubal ligation  year 36  . Carpal tunnel release      bilateral      Family History  Problem Relation Age of Onset  . Diabetes Sister   . Colon cancer Brother 40    History  Substance Use Topics  . Smoking status: Never Smoker   . Smokeless tobacco: Never Used  . Alcohol Use: No    OB History   Grav Para Term Preterm Abortions TAB SAB Ect Mult Living                  Review of Systems  Constitutional: Positive for appetite change. Negative for fever and diaphoresis.  HENT: Positive for ear pain. Negative for trouble swallowing, neck pain, neck stiffness and dental problem.        Right temporal/scalp pain, right ear pain, no pain with chewing  Eyes: Positive for visual disturbance.       Sees "twinkling"  Respiratory: Negative for apnea, chest tightness and shortness of breath.   Cardiovascular: Negative for chest pain and palpitations.  Gastrointestinal: Positive for nausea. Negative for vomiting, abdominal pain, diarrhea and constipation.  Genitourinary: Negative for dysuria and hematuria.  Musculoskeletal: Negative for gait problem.  Skin: Negative for rash.  Neurological: Positive for dizziness, syncope and headaches. Negative for weakness, light-headedness and numbness.    Allergies  Adhesive  Home Medications   Current Outpatient  Rx  Name  Route  Sig  Dispense  Refill  . albuterol (VENTOLIN HFA) 108 (90 BASE) MCG/ACT inhaler   Inhalation   Inhale 2 puffs into the lungs 2 (two) times daily as needed for wheezing or shortness of breath.          Marland Kitchen atenolol (TENORMIN) 100 MG tablet   Oral   Take 100 mg by mouth every morning.          . camphor-menthol (SARNA) lotion   Topical   Apply 1 application topically 2 (two) times daily.         . clonazePAM (KLONOPIN) 0.5 MG tablet   Oral   Take 1.5 mg by mouth 3 (three) times daily as needed for anxiety.          Marland Kitchen esomeprazole (NEXIUM) 40 MG capsule   Oral   Take 40 mg by mouth daily before breakfast.          . ferrous sulfate 325 (65 FE) MG tablet   Oral    Take 325 mg by mouth daily with breakfast.          . FLUoxetine (PROZAC) 20 MG capsule   Oral   Take 60 mg by mouth every morning.          . fluticasone (FLOVENT HFA) 220 MCG/ACT inhaler   Inhalation   Inhale 1 puff into the lungs 2 (two) times daily.          Marland Kitchen gabapentin (NEURONTIN) 100 MG capsule   Oral   Take 100 mg by mouth 3 (three) times daily.          . hydrochlorothiazide (HYDRODIURIL) 25 MG tablet   Oral   Take 1 tablet (25 mg total) by mouth daily.   30 tablet   0   . hydrochlorothiazide 25 MG tablet   Oral   Take 25 mg by mouth every morning.          . hydrOXYzine (ATARAX/VISTARIL) 10 MG tablet   Oral   Take 10 mg by mouth at bedtime as needed for itching.          . meclizine (ANTIVERT) 25 MG tablet   Oral   Take 25 mg by mouth 3 (three) times daily as needed for dizziness.         Marland Kitchen PRESCRIPTION MEDICATION   Topical   Apply 1 application topically 2 (two) times daily. Cream compounded at [facility]  triamcinolone 0.1% 3 parts, silver sulfadiazine 1% 1 part Applies to entire body after a bath         . tacrolimus (PROTOPIC) 0.1 % ointment   Topical   Apply 1 application topically 2 (two) times daily as needed (Psoriasis).          . traMADol (ULTRAM) 50 MG tablet   Oral   Take 1 tablet (50 mg total) by mouth every 8 (eight) hours as needed for pain.   30 tablet   0   . traMADol (ULTRAM) 50 MG tablet   Oral   Take 1 tablet (50 mg total) by mouth every 6 (six) hours as needed for pain.   15 tablet   0   . triamcinolone cream (KENALOG) 0.1 %   Topical   Apply 1 application topically 2 (two) times daily as needed. Entire body psoriasis         . warfarin (COUMADIN) 5 MG tablet   Oral   Take 5 mg by mouth every morning.  BP 155/80  Pulse 64  Temp(Src) 99.3 F (37.4 C) (Oral)  Resp 16  SpO2 97%  LMP 11/22/1996  Physical Exam  Nursing note and vitals reviewed. Constitutional: She is oriented to person,  place, and time. She appears well-developed and well-nourished. No distress.  HENT:  Head: Normocephalic and atraumatic.    Right Ear: Hearing and tympanic membrane normal. No drainage or tenderness.  Left Ear: Hearing and tympanic membrane normal. No drainage or tenderness.  Eyes: Conjunctivae and EOM are normal.  Neck: Normal range of motion. Neck supple.  No meningeal signs  Cardiovascular: Normal rate, regular rhythm and normal heart sounds.  Exam reveals no gallop and no friction rub.   No murmur heard. Pulmonary/Chest: Effort normal and breath sounds normal. No respiratory distress. She has no wheezes. She has no rales. She exhibits no tenderness.  Abdominal: Soft. Bowel sounds are normal. She exhibits no distension. There is no tenderness. There is no rebound and no guarding.  Musculoskeletal: Normal range of motion. She exhibits no edema and no tenderness.  No step-offs noted on C-spine Full range of motion of the T-spine and L-spine No tenderness to palpation of the spinous processes/paraspinous muscles of the C-spine, T-spine or L-spine Baseline strength in upper and lower extremities bilaterally including dorsiflexion and plantar flexion, strong and equal grip strength  Neurological: She is alert and oriented to person, place, and time. No cranial nerve deficit.  Speech is clear and goal oriented, follows commands Sensation normal to light touch and two point discrimination Moves extremities without ataxia, coordination intact   Skin: Skin is warm and dry. She is not diaphoretic. No erythema.  Psychiatric: She has a normal mood and affect.    ED Course  Procedures (including critical care time)  Labs Reviewed  SEDIMENTATION RATE - Abnormal; Notable for the following:    Sed Rate 28 (*)    All other components within normal limits  CK TOTAL AND CKMB - Abnormal; Notable for the following:    Total CK 203 (*)    All other components within normal limits  COMPREHENSIVE  METABOLIC PANEL - Abnormal; Notable for the following:    CO2 33 (*)    Glucose, Bld 115 (*)    Total Protein 8.4 (*)    Alkaline Phosphatase 122 (*)    Total Bilirubin 0.2 (*)    GFR calc non Af Amer 59 (*)    GFR calc Af Amer 68 (*)    All other components within normal limits  URINALYSIS, ROUTINE W REFLEX MICROSCOPIC - Abnormal; Notable for the following:    Leukocytes, UA TRACE (*)    All other components within normal limits  PROTIME-INR - Abnormal; Notable for the following:    Prothrombin Time 21.2 (*)    INR 1.92 (*)    All other components within normal limits  CBC WITH DIFFERENTIAL  URINE MICROSCOPIC-ADD ON   Dg Chest 2 View  01/21/2013  *RADIOLOGY REPORT*  Clinical Data: Syncope.  CHEST - 2 VIEW  Comparison: 10/02/2012  Findings: Heart is borderline enlarged.  Lungs are clear.  No effusions.  No acute bony abnormality.  Degenerative changes in the thoracic spine.  Tortuosity of the thoracic aorta.  Mediastinal contours otherwise within normal limits.  IMPRESSION: No acute cardiopulmonary disease.   Original Report Authenticated By: Charlett Nose, M.D.    Ct Head Wo Contrast  01/21/2013  *RADIOLOGY REPORT*  Clinical Data: 72 year old female with right side head pain. Syncope.  CT HEAD WITHOUT CONTRAST  Technique:  Contiguous axial images were obtained from the base of the skull through the vertex without contrast.  Comparison: 06/06/2012 and earlier.  Findings: Visualized paranasal sinuses and mastoids are clear. Chronic posterior right scalp soft tissue scarring re-identified. No acute scalp soft tissue finding. Visualized orbit soft tissues are within normal limits.  No acute osseous abnormality identified.  Calcified atherosclerosis at the skull base.  Cerebral volume is within normal limits for age.  No midline shift, ventriculomegaly, mass effect, evidence of mass lesion, intracranial hemorrhage or evidence of cortically based acute infarction.  Gray-white matter differentiation  is within normal limits throughout the brain.  No suspicious intracranial vascular hyperdensity.  IMPRESSION: Stable and normal for age noncontrast CT appearance of the brain.   Original Report Authenticated By: Erskine Speed, M.D.     Date: 01/21/2013  Rate: 58  Rhythm: sinus rhythm  QRS Axis: normal  Intervals: normal  ST/T Wave abnormalities: normal  Conduction Disutrbances: borderline AV conduction  Narrative Interpretation: Normal EKG  Old EKG Reviewed: 11/14/12 unchanged   1. BPPV (benign paroxysmal positional vertigo)       MDM  Likely vertigo, peripheral rather than central, but must consider TIA, giant cell arteritis, acoustic neuroma.  Pt on Coumadin. Check INR. Basic labs/UA plus sed rate, CK total and CKMB, visual acuity. If labs and imaging are unconcerning, will discharge with Antivert and follow up with her PCP with whom she has an appointment next week and will write her a referral for a neurologist as well.   While labs show elevated SED rate of 28, pt hx shows elevated SEDs as high as 55. This is on the lower end of her baseline and with lack of other clinical sx, not concerning for giant cell arteritis or PMH. Head CT shows no midline shift, ventriculomegaly, mass effect, evidence of mass lesion, intracranial hemorrhage or evidence of cortically based acute infarction. CXR and EKG reassuring.   Pt has appointment with Dr. Virl Son, her new primary care doctor, established for May 6 of next week. Refilled her prescription for Antivert (which pt states she had not been taking regularly) and for Ultram for her chronic pain.  At this time there does not appear to be any evidence of an acute emergency medical condition and the patient appears stable for discharge with appropriate outpatient follow up. Diagnosis was discussed with patient who verbalizes understanding and is agreeable to discharge. Pt case discussed with Dr. Blinda Leatherwood who agrees with plan.    Glade Nurse,  [place] 01/21/13 1934

## 2013-01-21 NOTE — Progress Notes (Signed)
   CARE MANAGEMENT ED NOTE 01/21/2013  Patient:  EMI, LYMON   Account Number:  [id number]  Date Initiated:  01/21/2013  Documentation initiated by:  Fransico Michael  Subjective/Objective Assessment:   presented to ED with c/o ear pain and dizziness     Subjective/Objective Assessment Detail:     Action/Plan:   requesting assistance with medications.   Action/Plan Detail:   Anticipated DC Date:  01/21/2013     Status Recommendation to Physician:   Result of Recommendation:      DC Planning Services  CM consult  Medication Assistance    Choice offered to / List presented to:            Status of service:    ED Comments:   ED Comments Detail:  01/21/13-1626-J.Masaru Chamberlin,RN,BSN [phone]      Received referral from [facility] to speak with patient regarding medication assistance. In to speak with patient. Unfortunately patient does not meet criteria for medication assistance due to having insurance.  Explained to patient. Voiced understanding. No further needs identified.

## 2013-01-22 NOTE — ED Provider Notes (Signed)
Medical screening examination/treatment/procedure(s) were conducted as a shared visit with non-physician practitioner(s) and myself.  I personally evaluated the patient during the encounter  Patient seen and evaluated with a PA. She was complaining of headache and scalp pain. No lesions were seen. Patient's neurologic exam entirely normal. Workup as well. Patient reports multiple previous episodes similar to this, treat his vertigo. Patient discharged with treatment, followup with primary Dr.  Gilda Crease, MD 01/22/13 1106

## 2013-01-31 ENCOUNTER — Emergency Department (HOSPITAL_COMMUNITY)
Admission: EM | Admit: 2013-01-31 | Discharge: 2013-01-31 | Disposition: A | Discharge status: Home / Self Care | Payer: PRIVATE HEALTH INSURANCE | Attending: Emergency Medicine | Admitting: Emergency Medicine

## 2013-01-31 ENCOUNTER — Encounter (HOSPITAL_COMMUNITY): Payer: Self-pay

## 2013-01-31 ENCOUNTER — Telehealth: Payer: Self-pay | Admitting: *Deleted

## 2013-01-31 ENCOUNTER — Emergency Department (HOSPITAL_COMMUNITY): Payer: PRIVATE HEALTH INSURANCE

## 2013-01-31 DIAGNOSIS — F209 Schizophrenia, unspecified: Secondary | ICD-10-CM | POA: Insufficient documentation

## 2013-01-31 DIAGNOSIS — M129 Arthropathy, unspecified: Secondary | ICD-10-CM | POA: Insufficient documentation

## 2013-01-31 DIAGNOSIS — Z8639 Personal history of other endocrine, nutritional and metabolic disease: Secondary | ICD-10-CM | POA: Insufficient documentation

## 2013-01-31 DIAGNOSIS — Z8739 Personal history of other diseases of the musculoskeletal system and connective tissue: Secondary | ICD-10-CM | POA: Insufficient documentation

## 2013-01-31 DIAGNOSIS — Z79899 Other long term (current) drug therapy: Secondary | ICD-10-CM | POA: Insufficient documentation

## 2013-01-31 DIAGNOSIS — J45909 Unspecified asthma, uncomplicated: Secondary | ICD-10-CM | POA: Insufficient documentation

## 2013-01-31 DIAGNOSIS — F411 Generalized anxiety disorder: Secondary | ICD-10-CM | POA: Insufficient documentation

## 2013-01-31 DIAGNOSIS — I10 Essential (primary) hypertension: Secondary | ICD-10-CM | POA: Insufficient documentation

## 2013-01-31 DIAGNOSIS — Z862 Personal history of diseases of the blood and blood-forming organs and certain disorders involving the immune mechanism: Secondary | ICD-10-CM | POA: Insufficient documentation

## 2013-01-31 DIAGNOSIS — Z7901 Long term (current) use of anticoagulants: Secondary | ICD-10-CM | POA: Insufficient documentation

## 2013-01-31 DIAGNOSIS — Z981 Arthrodesis status: Secondary | ICD-10-CM | POA: Insufficient documentation

## 2013-01-31 DIAGNOSIS — M25552 Pain in left hip: Secondary | ICD-10-CM

## 2013-01-31 DIAGNOSIS — Z872 Personal history of diseases of the skin and subcutaneous tissue: Secondary | ICD-10-CM | POA: Insufficient documentation

## 2013-01-31 DIAGNOSIS — Z86718 Personal history of other venous thrombosis and embolism: Secondary | ICD-10-CM | POA: Insufficient documentation

## 2013-01-31 DIAGNOSIS — G8929 Other chronic pain: Secondary | ICD-10-CM | POA: Insufficient documentation

## 2013-01-31 DIAGNOSIS — H409 Unspecified glaucoma: Secondary | ICD-10-CM | POA: Insufficient documentation

## 2013-01-31 DIAGNOSIS — Z8679 Personal history of other diseases of the circulatory system: Secondary | ICD-10-CM | POA: Insufficient documentation

## 2013-01-31 DIAGNOSIS — Z86711 Personal history of pulmonary embolism: Secondary | ICD-10-CM | POA: Insufficient documentation

## 2013-01-31 DIAGNOSIS — M25559 Pain in unspecified hip: Secondary | ICD-10-CM | POA: Insufficient documentation

## 2013-01-31 DIAGNOSIS — K219 Gastro-esophageal reflux disease without esophagitis: Secondary | ICD-10-CM | POA: Insufficient documentation

## 2013-01-31 DIAGNOSIS — G4730 Sleep apnea, unspecified: Secondary | ICD-10-CM | POA: Insufficient documentation

## 2013-01-31 MED ORDER — HYDROCODONE-ACETAMINOPHEN 5-325 MG PO TABS: 1.0000 | ORAL_TABLET | Freq: Four times a day (QID) | ORAL | Status: DC | PRN

## 2013-01-31 MED ORDER — HYDROCODONE-ACETAMINOPHEN 5-325 MG PO TABS
2.0000 | ORAL_TABLET | Freq: Once | ORAL | Status: DC
  Filled 2013-01-31: qty 2

## 2013-01-31 MED ORDER — MORPHINE SULFATE 4 MG/ML IJ SOLN
4.0000 mg | Freq: Once | INTRAMUSCULAR | Status: AC
  Administered 2013-01-31: 4 mg via INTRAMUSCULAR
  Filled 2013-01-31: qty 1

## 2013-01-31 NOTE — ED Notes (Addendum)
Pt upset about being d/c'd, refused the Vicodin, and requesting a transfer to another facility.  This RN advised the pt that we could not transfer to a facility since there were no significant problems found and pt was already dispo'd for d/c.  Pt stated she is not treated this way at [facility] and [facility].  This RN asked pt what those facilities do that we did not so that I could address it with the MD.  Pt stated she does not want a bunch of pills.  Pt then requested to speak with Charge RN.  Martie Lee, charge RN notified.

## 2013-01-31 NOTE — Telephone Encounter (Signed)
I called and left a message for the patient to callback to schedule sleep consult with Dr. Vickey Huger.

## 2013-01-31 NOTE — ED Notes (Signed)
Pt reports hx of bursitis.  Pt states this morning after waking up she began having L hip pain that she feels is related to her bursitis.  Pt also has hx of fibromyalgia and chronic pain.  Pt ambulatory with stand by assist from EMS stretcher in hallway to pt room.

## 2013-01-31 NOTE — ED Provider Notes (Signed)
History     CSN: [id number]  Arrival date & time 01/31/13  1135   First MD Initiated Contact with Patient 01/31/13 1209      Chief Complaint  Patient presents with  . Hip Pain    (Consider location/radiation/quality/duration/timing/severity/associated sxs/prior treatment) Patient is a 72 y.o. female presenting with hip pain. The history is provided by the patient.  Hip Pain Pertinent negatives include no chest pain, no abdominal pain, no headaches and no shortness of breath.  pt c/o left hip pain. States problems w arthritis and bursitis in past. States was lying on hip and now it hurts more. Denies trauma/fall. No back or radicular pain although notes hx ddd. No fever or chills. No numbness or weakness. No knee pain. Pain dull, mod, constant, worse w walking.    Past Medical History  Diagnosis Date  . Depression   . Schizophrenia   . Arthritis   . Hyperlipidemia   . Hypertension   . Chronic pain   . 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 DVT (deep venous thrombosis)     per 01/17/10 d/c summary- "remote hx of dvt"?  . Iron deficiency anemia   . Chronic headaches   . Sleep apnea   . Glaucoma(365)   . Hemorrhoids   . Asthma   . Anxiety   . GERD (gastroesophageal reflux disease)   . Psoriasis   . Psoriasis 04/29/2012    New onset evaluated by Dr Marylou Flesher, Dermatology, [facility] 6/13  Rx 0.1% Tacrolimus ointment    Past Surgical History  Procedure Laterality Date  . Spinal fusion      2004  . Joint replacement  right knee 11/2008  . Tubal ligation  year 89  . Carpal tunnel release      bilateral     Family History  Problem Relation Age of Onset  . Diabetes Sister   . Colon cancer Brother 40    History  Substance Use Topics  . Smoking status: Never Smoker   . Smokeless tobacco: Never Used  . Alcohol Use: No    OB History   Grav Para Term Preterm Abortions TAB SAB Ect Mult Living                  Review of Systems   Constitutional: Negative for fever and chills.  HENT: Negative for neck pain.   Eyes: Negative for redness.  Respiratory: Negative for shortness of breath.   Cardiovascular: Negative for chest pain and leg swelling.  Gastrointestinal: Negative for abdominal pain.  Genitourinary: Negative for flank pain.  Musculoskeletal: Negative for back pain.  Skin: Negative for rash.  Neurological: Negative for weakness, numbness and headaches.  Hematological: Does not bruise/bleed easily.  Psychiatric/Behavioral: Negative for confusion.    Allergies  Adhesive  Home Medications   Current Outpatient Rx  Name  Route  Sig  Dispense  Refill  . albuterol (VENTOLIN HFA) 108 (90 BASE) MCG/ACT inhaler   Inhalation   Inhale 2 puffs into the lungs 2 (two) times daily as needed for wheezing or shortness of breath.          Marland Kitchen antipyrine-benzocaine (AURALGAN) otic solution   Both Ears   Place 2 drops into both ears daily as needed for pain.         Marland Kitchen atenolol (TENORMIN) 100 MG tablet   Oral   Take 100 mg by mouth daily.          Marland Kitchen  camphor-menthol (SARNA) lotion   Topical   Apply 1 application topically 2 (two) times daily.         Marland Kitchen esomeprazole (NEXIUM) 40 MG capsule   Oral   Take 40 mg by mouth daily before breakfast.          . FLUoxetine (PROZAC) 20 MG capsule   Oral   Take 60 mg by mouth daily.          . fluticasone (FLOVENT HFA) 220 MCG/ACT inhaler   Inhalation   Inhale 1 puff into the lungs 2 (two) times daily.          Marland Kitchen gabapentin (NEURONTIN) 100 MG capsule   Oral   Take 100 mg by mouth 3 (three) times daily.          . hydrochlorothiazide (HYDRODIURIL) 25 MG tablet   Oral   Take 1 tablet (25 mg total) by mouth daily.   30 tablet   0   . hydrOXYzine (ATARAX/VISTARIL) 10 MG tablet   Oral   Take 10 mg by mouth at bedtime. For psoriasis itching         . meclizine (ANTIVERT) 25 MG tablet   Oral   Take 25 mg by mouth 3 (three) times daily. Until vertigo  is under control         . meclizine (ANTIVERT) 25 MG tablet   Oral   Take 1 tablet (25 mg total) by mouth 4 (four) times daily.   28 tablet   0   . PRESCRIPTION MEDICATION   Topical   Apply 1 application topically 2 (two) times daily. Cream compounded at [facility]  triamcinolone 0.1% 3 parts, silver sulfadiazine 1% 1 part Applies to entire body after a bath         . tacrolimus (PROTOPIC) 0.1 % ointment   Topical   Apply 1 application topically 2 (two) times daily as needed (Psoriasis).          . traMADol (ULTRAM) 50 MG tablet   Oral   Take 1 tablet (50 mg total) by mouth every 8 (eight) hours as needed for pain.   30 tablet   0   . traMADol (ULTRAM) 50 MG tablet   Oral   Take 1 tablet (50 mg total) by mouth every 6 (six) hours as needed for pain.   15 tablet   0   . triamcinolone cream (KENALOG) 0.1 %   Topical   Apply 1 application topically 2 (two) times daily as needed. Entire body psoriasis         . warfarin (COUMADIN) 5 MG tablet   Oral   Take 5 mg by mouth daily.            BP 158/80  Pulse 77  Temp(Src) 98.1 F (36.7 C) (Oral)  Resp 18  SpO2 98%  LMP 11/22/1996  Physical Exam  Nursing note and vitals reviewed. Constitutional: She appears well-developed and well-nourished. No distress.  Eyes: Conjunctivae are normal. No scleral icterus.  Neck: Neck supple. No tracheal deviation present.  Cardiovascular: Normal rate.   Pulmonary/Chest: Effort normal. No respiratory distress.  Abdominal: Soft. Normal appearance. She exhibits no distension and no mass. There is no tenderness. There is no rebound and no guarding.  Genitourinary:  No cva tenderness  Musculoskeletal: She exhibits no edema and no tenderness.  Pain w active rom hip, good passive rom comfortably. No findings of septic joint. No skin changes or erythema. Distal pulses palp. No pain w  rom at knee. tls spine non tender.   Neurological: She is alert.  Motor intact bil. Steady gait.    Skin: Skin is warm and dry. No rash noted.  No skin changes/shingles in area of pain  Psychiatric: She has a normal mood and affect.    ED Course  Procedures (including critical care time)   Results for orders placed during the hospital encounter of 01/21/13  SEDIMENTATION RATE      Result Value Range   Sed Rate 28 (*) 0 - 22 mm/hr  CK TOTAL AND CKMB      Result Value Range   Total CK 203 (*) 7 - 177 U/L   CK, MB 2.8  0.3 - 4.0 ng/mL   Relative Index 1.4  0.0 - 2.5  CBC WITH DIFFERENTIAL      Result Value Range   WBC 7.0  4.0 - 10.5 K/uL   RBC 4.01  3.87 - 5.11 MIL/uL   Hemoglobin 12.9  12.0 - 15.0 g/dL   HCT 40.9  81.1 - 91.4 %   MCV 92.8  78.0 - 100.0 fL   MCH 32.2  26.0 - 34.0 pg   MCHC 34.7  30.0 - 36.0 g/dL   RDW 78.2  95.6 - 21.3 %   Platelets 240  150 - 400 K/uL   Neutrophils Relative 72  43 - 77 %   Neutro Abs 5.0  1.7 - 7.7 K/uL   Lymphocytes Relative 21  12 - 46 %   Lymphs Abs 1.5  0.7 - 4.0 K/uL   Monocytes Relative 5  3 - 12 %   Monocytes Absolute 0.3  0.1 - 1.0 K/uL   Eosinophils Relative 2  0 - 5 %   Eosinophils Absolute 0.2  0.0 - 0.7 K/uL   Basophils Relative 0  0 - 1 %   Basophils Absolute 0.0  0.0 - 0.1 K/uL  COMPREHENSIVE METABOLIC PANEL      Result Value Range   Sodium 137  135 - 145 mEq/L   Potassium 4.3  3.5 - 5.1 mEq/L   Chloride 98  96 - 112 mEq/L   CO2 33 (*) 19 - 32 mEq/L   Glucose, Bld 115 (*) 70 - 99 mg/dL   BUN 18  6 - 23 mg/dL   Creatinine, Ser 0.86  0.50 - 1.10 mg/dL   Calcium 9.6  8.4 - 57.8 mg/dL   Total Protein 8.4 (*) 6.0 - 8.3 g/dL   Albumin 3.7  3.5 - 5.2 g/dL   AST 21  0 - 37 U/L   ALT 10  0 - 35 U/L   Alkaline Phosphatase 122 (*) 39 - 117 U/L   Total Bilirubin 0.2 (*) 0.3 - 1.2 mg/dL   GFR calc non Af Amer 59 (*) >90 mL/min   GFR calc Af Amer 68 (*) >90 mL/min  URINALYSIS, ROUTINE W REFLEX MICROSCOPIC      Result Value Range   Color, Urine YELLOW  YELLOW   APPearance CLEAR  CLEAR   Specific Gravity, Urine 1.015   1.005 - 1.030   pH 7.0  5.0 - 8.0   Glucose, UA NEGATIVE  NEGATIVE mg/dL   Hgb urine dipstick NEGATIVE  NEGATIVE   Bilirubin Urine NEGATIVE  NEGATIVE   Ketones, ur NEGATIVE  NEGATIVE mg/dL   Protein, ur NEGATIVE  NEGATIVE mg/dL   Urobilinogen, UA 0.2  0.0 - 1.0 mg/dL   Nitrite NEGATIVE  NEGATIVE   Leukocytes, UA TRACE (*) NEGATIVE  PROTIME-INR      Result Value Range   Prothrombin Time 21.2 (*) 11.6 - 15.2 seconds   INR 1.92 (*) 0.00 - 1.49  URINE MICROSCOPIC-ADD ON      Result Value Range   Squamous Epithelial / LPF RARE  RARE   WBC, UA 0-2  <3 WBC/hpf   RBC / HPF 0-2  <3 RBC/hpf   Dg Chest 2 View  01/21/2013  *RADIOLOGY REPORT*  Clinical Data: Syncope.  CHEST - 2 VIEW  Comparison: 10/02/2012  Findings: Heart is borderline enlarged.  Lungs are clear.  No effusions.  No acute bony abnormality.  Degenerative changes in the thoracic spine.  Tortuosity of the thoracic aorta.  Mediastinal contours otherwise within normal limits.  IMPRESSION: No acute cardiopulmonary disease.   Original Report Authenticated By: Charlett Nose, M.D.    Dg Hip Complete Left  01/31/2013  *RADIOLOGY REPORT*  Clinical Data: Left hip pain.  LEFT HIP - COMPLETE 2+ VIEW  Comparison: None  Findings: There is no evidence of fracture, subluxation or dislocation. The joint space is unremarkable. No focal bony lesions are present. Lower lumbar surgical changes identified.  IMPRESSION: No evidence of acute or significant hip abnormality.   Original Report Authenticated By: Harmon Pier, M.D.    Ct Head Wo Contrast  01/21/2013  *RADIOLOGY REPORT*  Clinical Data: 72 year old female with right side head pain. Syncope.  CT HEAD WITHOUT CONTRAST  Technique:  Contiguous axial images were obtained from the base of the skull through the vertex without contrast.  Comparison: 06/06/2012 and earlier.  Findings: Visualized paranasal sinuses and mastoids are clear. Chronic posterior right scalp soft tissue scarring re-identified. No acute  scalp soft tissue finding. Visualized orbit soft tissues are within normal limits.  No acute osseous abnormality identified.  Calcified atherosclerosis at the skull base.  Cerebral volume is within normal limits for age.  No midline shift, ventriculomegaly, mass effect, evidence of mass lesion, intracranial hemorrhage or evidence of cortically based acute infarction.  Gray-white matter differentiation is within normal limits throughout the brain.  No suspicious intracranial vascular hyperdensity.  IMPRESSION: Stable and normal for age noncontrast CT appearance of the brain.   Original Report Authenticated By: Erskine Speed, M.D.       MDM  Pt requests pain shot.  Morphine im.  Xr.  Recheck pt comfortable. Spine nt. No new neuro c/o or deficits on exam. Pt appears stable for d/c.          Suzi Roots, MD 01/31/13 1345

## 2013-02-03 ENCOUNTER — Encounter (HOSPITAL_COMMUNITY): Payer: Self-pay | Admitting: Emergency Medicine

## 2013-02-03 ENCOUNTER — Emergency Department (HOSPITAL_COMMUNITY)
Admission: EM | Admit: 2013-02-03 | Discharge: 2013-02-03 | Disposition: A | Discharge status: Home / Self Care | Payer: PRIVATE HEALTH INSURANCE | Attending: Emergency Medicine | Admitting: Emergency Medicine

## 2013-02-03 DIAGNOSIS — R110 Nausea: Secondary | ICD-10-CM | POA: Insufficient documentation

## 2013-02-03 DIAGNOSIS — M129 Arthropathy, unspecified: Secondary | ICD-10-CM | POA: Insufficient documentation

## 2013-02-03 DIAGNOSIS — Z8673 Personal history of transient ischemic attack (TIA), and cerebral infarction without residual deficits: Secondary | ICD-10-CM | POA: Insufficient documentation

## 2013-02-03 DIAGNOSIS — K219 Gastro-esophageal reflux disease without esophagitis: Secondary | ICD-10-CM | POA: Insufficient documentation

## 2013-02-03 DIAGNOSIS — I10 Essential (primary) hypertension: Secondary | ICD-10-CM | POA: Insufficient documentation

## 2013-02-03 DIAGNOSIS — F209 Schizophrenia, unspecified: Secondary | ICD-10-CM | POA: Insufficient documentation

## 2013-02-03 DIAGNOSIS — F411 Generalized anxiety disorder: Secondary | ICD-10-CM | POA: Insufficient documentation

## 2013-02-03 DIAGNOSIS — Z8669 Personal history of other diseases of the nervous system and sense organs: Secondary | ICD-10-CM | POA: Insufficient documentation

## 2013-02-03 DIAGNOSIS — L408 Other psoriasis: Secondary | ICD-10-CM | POA: Insufficient documentation

## 2013-02-03 DIAGNOSIS — G8929 Other chronic pain: Secondary | ICD-10-CM | POA: Insufficient documentation

## 2013-02-03 DIAGNOSIS — J45909 Unspecified asthma, uncomplicated: Secondary | ICD-10-CM | POA: Insufficient documentation

## 2013-02-03 DIAGNOSIS — Z8679 Personal history of other diseases of the circulatory system: Secondary | ICD-10-CM | POA: Insufficient documentation

## 2013-02-03 DIAGNOSIS — Z8639 Personal history of other endocrine, nutritional and metabolic disease: Secondary | ICD-10-CM | POA: Insufficient documentation

## 2013-02-03 DIAGNOSIS — Z86711 Personal history of pulmonary embolism: Secondary | ICD-10-CM | POA: Insufficient documentation

## 2013-02-03 DIAGNOSIS — Z862 Personal history of diseases of the blood and blood-forming organs and certain disorders involving the immune mechanism: Secondary | ICD-10-CM | POA: Insufficient documentation

## 2013-02-03 DIAGNOSIS — Z79899 Other long term (current) drug therapy: Secondary | ICD-10-CM | POA: Insufficient documentation

## 2013-02-03 DIAGNOSIS — R51 Headache: Secondary | ICD-10-CM | POA: Insufficient documentation

## 2013-02-03 DIAGNOSIS — IMO0002 Reserved for concepts with insufficient information to code with codable children: Secondary | ICD-10-CM | POA: Insufficient documentation

## 2013-02-03 DIAGNOSIS — Z7901 Long term (current) use of anticoagulants: Secondary | ICD-10-CM | POA: Insufficient documentation

## 2013-02-03 MED ORDER — KETOROLAC TROMETHAMINE 60 MG/2ML IM SOLN
60.0000 mg | Freq: Once | INTRAMUSCULAR | Status: AC
  Administered 2013-02-03: 60 mg via INTRAMUSCULAR
  Filled 2013-02-03: qty 2

## 2013-02-03 MED ORDER — DIAZEPAM 5 MG/ML IJ SOLN: 2.5000 mg | Freq: Once | INTRAMUSCULAR | Status: DC

## 2013-02-03 MED ORDER — DIAZEPAM 5 MG/ML IJ SOLN
2.5000 mg | Freq: Once | INTRAMUSCULAR | Status: AC
  Administered 2013-02-03: 2.5 mg via INTRAMUSCULAR
  Filled 2013-02-03: qty 2

## 2013-02-03 NOTE — ED Notes (Signed)
Onset one day ago patient's son startled patient by screaming having a seizure.  Patient feels stressed and having a headache with nausea. Alert answering and following commands appropriate.

## 2013-02-03 NOTE — ED Notes (Signed)
Pt states she feels much better and is ready to go home.

## 2013-02-03 NOTE — ED Notes (Signed)
Pt presents to department for evaluation of headache and anxiety. States son has been having seizures and she is very stressed out. Also states pain all over body, no relief with tramadol at home. Pt is conscious alert and oriented x4. 7/10 pain. Pt is eating banana upon arrival to ED. No signs of acute distress noted.

## 2013-02-03 NOTE — ED Provider Notes (Signed)
History     CSN: [id number]  Arrival date & time 02/03/13  1004   First MD Initiated Contact with Patient 02/03/13 1020      Chief Complaint  Patient presents with  . Headache  . Nausea    (Consider location/radiation/quality/duration/timing/severity/associated sxs/prior treatment) HPI Comments: Patient is a 72 year old female with a past medical history of chronic migraines who presents with a headache since last night. Patient reports a gradual onset and progressive worsening of the headache. The pain is sharp, constant and is located in generalized head without radiation. Patient has tried nothing for symptoms without relief. No alleviating/aggravating factors. Patient reports associated nausea. Patient denies fever, vomiting, diarrhea, numbness/tingling, weakness, visual changes, congestion, chest pain, SOB, abdominal pain. Patient states she is seen at a pain clinic for chronic migraines triggered by stress.      Past Medical History  Diagnosis Date  . Depression   . Schizophrenia   . Arthritis   . Hyperlipidemia   . Hypertension   . Chronic pain   . 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 DVT (deep venous thrombosis)     per 01/17/10 d/c summary- "remote hx of dvt"?  . Iron deficiency anemia   . Chronic headaches   . Sleep apnea   . Glaucoma(365)   . Hemorrhoids   . Asthma   . Anxiety   . GERD (gastroesophageal reflux disease)   . Psoriasis   . Psoriasis 04/29/2012    New onset evaluated by Dr Marylou Flesher, Dermatology, [facility] 6/13  Rx 0.1% Tacrolimus ointment    Past Surgical History  Procedure Laterality Date  . Spinal fusion      2004  . Joint replacement  right knee 11/2008  . Tubal ligation  year 20  . Carpal tunnel release      bilateral     Family History  Problem Relation Age of Onset  . Diabetes Sister   . Colon cancer Brother 40    History  Substance Use Topics  . Smoking status: Never Smoker   . Smokeless  tobacco: Never Used  . Alcohol Use: No    OB History   Grav Para Term Preterm Abortions TAB SAB Ect Mult Living                  Review of Systems  Gastrointestinal: Positive for nausea.  Neurological: Positive for headaches.  All other systems reviewed and are negative.    Allergies  Review of patient's allergies indicates no active allergies.  Home Medications   Current Outpatient Rx  Name  Route  Sig  Dispense  Refill  . albuterol (VENTOLIN HFA) 108 (90 BASE) MCG/ACT inhaler   Inhalation   Inhale 2 puffs into the lungs 2 (two) times daily as needed for wheezing or shortness of breath.          Marland Kitchen antipyrine-benzocaine (AURALGAN) otic solution   Both Ears   Place 2 drops into both ears daily as needed for pain.         Marland Kitchen atenolol (TENORMIN) 100 MG tablet   Oral   Take 100 mg by mouth daily.          . camphor-menthol (SARNA) lotion   Topical   Apply 1 application topically 2 (two) times daily.         . celecoxib (CELEBREX) 200 MG capsule   Oral   Take 200 mg by mouth daily as needed  for pain.         Marland Kitchen esomeprazole (NEXIUM) 40 MG capsule   Oral   Take 40 mg by mouth daily before breakfast.          . FLUoxetine (PROZAC) 20 MG capsule   Oral   Take 60 mg by mouth daily.          . fluticasone (FLOVENT HFA) 220 MCG/ACT inhaler   Inhalation   Inhale 1 puff into the lungs 2 (two) times daily.          Marland Kitchen gabapentin (NEURONTIN) 100 MG capsule   Oral   Take 100 mg by mouth 3 (three) times daily.         . hydrochlorothiazide (HYDRODIURIL) 25 MG tablet   Oral   Take 1 tablet (25 mg total) by mouth daily.   30 tablet   0   . hydrOXYzine (ATARAX/VISTARIL) 10 MG tablet   Oral   Take 10 mg by mouth at bedtime. For psoriasis itching         . meclizine (ANTIVERT) 25 MG tablet   Oral   Take 25 mg by mouth 3 (three) times daily as needed for nausea.         Marland Kitchen PRESCRIPTION MEDICATION   Topical   Apply 1 application topically 2 (two)  times daily. Cream compounded at [facility]  triamcinolone 0.1% 3 parts, silver sulfadiazine 1% 1 part Applies to entire body after a bath         . tacrolimus (PROTOPIC) 0.1 % ointment   Topical   Apply 1 application topically 2 (two) times daily as needed (Psoriasis).          . traMADol (ULTRAM) 50 MG tablet   Oral   Take 1 tablet (50 mg total) by mouth every 6 (six) hours as needed for pain.   15 tablet   0   . triamcinolone cream (KENALOG) 0.1 %   Topical   Apply 1 application topically 2 (two) times daily as needed. Entire body psoriasis         . warfarin (COUMADIN) 5 MG tablet   Oral   Take 5 mg by mouth daily.            BP 163/92  Pulse 52  Temp(Src) 97.9 F (36.6 C) (Oral)  Resp 20  SpO2 98%  LMP 11/22/1996  Physical Exam  Nursing note and vitals reviewed. Constitutional: She is oriented to person, place, and time. She appears well-developed and well-nourished. No distress.  HENT:  Head: Normocephalic and atraumatic.  Mouth/Throat: Oropharynx is clear and moist. No oropharyngeal exudate.  Eyes: Conjunctivae and EOM are normal. Pupils are equal, round, and reactive to light. No scleral icterus.  Neck: Normal range of motion.  Cardiovascular: Normal rate and regular rhythm.  Exam reveals no gallop and no friction rub.   No murmur heard. Pulmonary/Chest: Effort normal and breath sounds normal. She has no wheezes. She has no rales. She exhibits no tenderness.  Abdominal: Soft. There is no tenderness.  Musculoskeletal: Normal range of motion.  Neurological: She is alert and oriented to person, place, and time. No cranial nerve deficit. Coordination normal.  Extremity strength and sensation equal and intact bilaterally. Speech is goal-oriented. Moves limbs without ataxia.   Skin: Skin is warm and dry.  Psychiatric: She has a normal mood and affect. Her behavior is normal.    ED Course  Procedures (including critical care time)  Labs Reviewed - No data  to  display No results found.   1. Headache       MDM  10:59 AM Patient will have toradol and valium for headache. No neuro deficits. Vitals stable and patient afebrile. Patient denies trauma, fall, LOC.   12:43 PM Patient feeling better and will be discharged with instructions to follow up with her PCP. Vitals stable and patient afebrile.      Emilia Beck, PA-C 02/03/13 1246

## 2013-02-03 NOTE — ED Provider Notes (Signed)
Medical screening examination/treatment/procedure(s) were performed by non-physician practitioner and as supervising physician I was immediately available for consultation/collaboration.   Richardean Canal, MD 02/03/13 1259

## 2013-02-20 ENCOUNTER — Other Ambulatory Visit: Payer: Medicare Other | Admitting: Lab

## 2013-02-20 ENCOUNTER — Ambulatory Visit: Payer: Medicare Other

## 2013-02-25 ENCOUNTER — Other Ambulatory Visit: Payer: PRIVATE HEALTH INSURANCE | Admitting: Lab

## 2013-02-25 ENCOUNTER — Ambulatory Visit: Payer: PRIVATE HEALTH INSURANCE

## 2013-02-27 ENCOUNTER — Encounter (HOSPITAL_COMMUNITY): Payer: Self-pay | Admitting: *Deleted

## 2013-02-27 ENCOUNTER — Emergency Department (HOSPITAL_COMMUNITY)
Admission: EM | Admit: 2013-02-27 | Discharge: 2013-02-27 | Disposition: A | Discharge status: Home / Self Care | Payer: PRIVATE HEALTH INSURANCE | Attending: Emergency Medicine | Admitting: Emergency Medicine

## 2013-02-27 DIAGNOSIS — R0789 Other chest pain: Secondary | ICD-10-CM | POA: Insufficient documentation

## 2013-02-27 DIAGNOSIS — F39 Unspecified mood [affective] disorder: Secondary | ICD-10-CM | POA: Insufficient documentation

## 2013-02-27 DIAGNOSIS — E876 Hypokalemia: Secondary | ICD-10-CM | POA: Insufficient documentation

## 2013-02-27 DIAGNOSIS — F3289 Other specified depressive episodes: Secondary | ICD-10-CM | POA: Insufficient documentation

## 2013-02-27 DIAGNOSIS — IMO0002 Reserved for concepts with insufficient information to code with codable children: Secondary | ICD-10-CM | POA: Insufficient documentation

## 2013-02-27 DIAGNOSIS — J45909 Unspecified asthma, uncomplicated: Secondary | ICD-10-CM | POA: Insufficient documentation

## 2013-02-27 DIAGNOSIS — Z8739 Personal history of other diseases of the musculoskeletal system and connective tissue: Secondary | ICD-10-CM | POA: Insufficient documentation

## 2013-02-27 DIAGNOSIS — Z79899 Other long term (current) drug therapy: Secondary | ICD-10-CM | POA: Insufficient documentation

## 2013-02-27 DIAGNOSIS — M549 Dorsalgia, unspecified: Secondary | ICD-10-CM | POA: Insufficient documentation

## 2013-02-27 DIAGNOSIS — G4730 Sleep apnea, unspecified: Secondary | ICD-10-CM | POA: Insufficient documentation

## 2013-02-27 DIAGNOSIS — F209 Schizophrenia, unspecified: Secondary | ICD-10-CM | POA: Insufficient documentation

## 2013-02-27 DIAGNOSIS — F411 Generalized anxiety disorder: Secondary | ICD-10-CM | POA: Insufficient documentation

## 2013-02-27 DIAGNOSIS — R51 Headache: Secondary | ICD-10-CM | POA: Insufficient documentation

## 2013-02-27 DIAGNOSIS — K219 Gastro-esophageal reflux disease without esophagitis: Secondary | ICD-10-CM | POA: Insufficient documentation

## 2013-02-27 DIAGNOSIS — Z8669 Personal history of other diseases of the nervous system and sense organs: Secondary | ICD-10-CM | POA: Insufficient documentation

## 2013-02-27 DIAGNOSIS — R079 Chest pain, unspecified: Secondary | ICD-10-CM

## 2013-02-27 DIAGNOSIS — Z86711 Personal history of pulmonary embolism: Secondary | ICD-10-CM | POA: Insufficient documentation

## 2013-02-27 DIAGNOSIS — Z872 Personal history of diseases of the skin and subcutaneous tissue: Secondary | ICD-10-CM | POA: Insufficient documentation

## 2013-02-27 DIAGNOSIS — R42 Dizziness and giddiness: Secondary | ICD-10-CM

## 2013-02-27 DIAGNOSIS — I10 Essential (primary) hypertension: Secondary | ICD-10-CM | POA: Insufficient documentation

## 2013-02-27 DIAGNOSIS — Z862 Personal history of diseases of the blood and blood-forming organs and certain disorders involving the immune mechanism: Secondary | ICD-10-CM | POA: Insufficient documentation

## 2013-02-27 DIAGNOSIS — Z8639 Personal history of other endocrine, nutritional and metabolic disease: Secondary | ICD-10-CM | POA: Insufficient documentation

## 2013-02-27 DIAGNOSIS — F329 Major depressive disorder, single episode, unspecified: Secondary | ICD-10-CM | POA: Insufficient documentation

## 2013-02-27 DIAGNOSIS — Z8679 Personal history of other diseases of the circulatory system: Secondary | ICD-10-CM | POA: Insufficient documentation

## 2013-02-27 DIAGNOSIS — Z7901 Long term (current) use of anticoagulants: Secondary | ICD-10-CM | POA: Insufficient documentation

## 2013-02-27 DIAGNOSIS — E860 Dehydration: Secondary | ICD-10-CM | POA: Insufficient documentation

## 2013-02-27 LAB — CBC WITH DIFFERENTIAL/PLATELET
Basophils Absolute: 0 10*3/uL (ref 0.0–0.1)
Basophils Relative: 0 % (ref 0–1)
Eosinophils Relative: 3 % (ref 0–5)
HCT: 37.4 % (ref 36.0–46.0)
Hemoglobin: 13 g/dL (ref 12.0–15.0)
MCHC: 34.8 g/dL (ref 30.0–36.0)
MCV: 92.1 fL (ref 78.0–100.0)
Monocytes Absolute: 0.6 10*3/uL (ref 0.1–1.0)
Monocytes Relative: 7 % (ref 3–12)
RDW: 13.6 % (ref 11.5–15.5)

## 2013-02-27 LAB — POCT I-STAT, CHEM 8
BUN: 19 mg/dL (ref 6–23)
Creatinine, Ser: 1.2 mg/dL — ABNORMAL HIGH (ref 0.50–1.10)
Glucose, Bld: 110 mg/dL — ABNORMAL HIGH (ref 70–99)
Hemoglobin: 13.9 g/dL (ref 12.0–15.0)
Potassium: 3.1 mEq/L — ABNORMAL LOW (ref 3.5–5.1)
Sodium: 137 mEq/L (ref 135–145)
TCO2: 32 mmol/L (ref 0–100)

## 2013-02-27 LAB — POCT I-STAT TROPONIN I

## 2013-02-27 MED ORDER — SODIUM CHLORIDE 0.9 % IV BOLUS (SEPSIS)
1000.0000 mL | Freq: Once | INTRAVENOUS | Status: AC
  Administered 2013-02-27: 1000 mL via INTRAVENOUS

## 2013-02-27 MED ORDER — MECLIZINE HCL 25 MG PO TABS
25.0000 mg | ORAL_TABLET | Freq: Once | ORAL | Status: AC
  Administered 2013-02-27: 25 mg via ORAL
  Filled 2013-02-27: qty 1

## 2013-02-27 MED ORDER — POTASSIUM CHLORIDE CRYS ER 20 MEQ PO TBCR
40.0000 meq | EXTENDED_RELEASE_TABLET | Freq: Once | ORAL | Status: AC
  Administered 2013-02-27: 40 meq via ORAL
  Filled 2013-02-27: qty 2

## 2013-02-27 MED ORDER — METOCLOPRAMIDE HCL 5 MG/ML IJ SOLN
10.0000 mg | Freq: Once | INTRAMUSCULAR | Status: AC
  Administered 2013-02-27: 10 mg via INTRAVENOUS
  Filled 2013-02-27: qty 2

## 2013-02-27 MED ORDER — MECLIZINE HCL 12.5 MG PO TABS: 12.5000 mg | ORAL_TABLET | Freq: Three times a day (TID) | ORAL | Status: DC | PRN

## 2013-02-27 NOTE — ED Provider Notes (Signed)
History     CSN: [id number]  Arrival date & time 02/27/13  0226   First MD Initiated Contact with Patient 02/27/13 0425      Chief Complaint  Patient presents with  . Dizziness    (Consider location/radiation/quality/duration/timing/severity/associated sxs/prior treatment) The history is provided by the patient.  [name] [name] is a 72 y.o. female hx of depression, schizophrenia, HL, HTN, vertigo here with dizziness. She felt dizzy last night. She said she felt the room was spinning also she felt lightheaded. She took her meclizine but it didn't improve so she came here for evaluation. She also has several other complaints as well. She has been feeling depressed recently but denies any suicidal homicidal ideations. She also has chronic chest pain that is not getting worse. She also is chronic back pain that is stable. She says she has been unsteady in her gait but that has been going on for a long time. Denies any knee weakness or numbness or urinary symptoms. Denies any fevers or chills or cough.    Past Medical History  Diagnosis Date  . Depression   . Schizophrenia   . Arthritis   . Hyperlipidemia   . Hypertension   . Chronic pain   . 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 DVT (deep venous thrombosis)     per 01/17/10 d/c summary- "remote hx of dvt"?  . Iron deficiency anemia   . Chronic headaches   . Sleep apnea   . Glaucoma   . Hemorrhoids   . Asthma   . Anxiety   . GERD (gastroesophageal reflux disease)   . Psoriasis   . Psoriasis 04/29/2012    New onset evaluated by Dr Marylou Flesher, Dermatology, [facility] 6/13  Rx 0.1% Tacrolimus ointment    Past Surgical History  Procedure Laterality Date  . Spinal fusion      2004  . Joint replacement  right knee 11/2008  . Tubal ligation  year 58  . Carpal tunnel release      bilateral     Family History  Problem Relation Age of Onset  . Diabetes Sister   . Colon cancer Brother 40    History   Substance Use Topics  . Smoking status: Never Smoker   . Smokeless tobacco: Never Used  . Alcohol Use: No    OB History   Grav Para Term Preterm Abortions TAB SAB Ect Mult Living                  Review of Systems  Musculoskeletal: Positive for back pain.  Neurological: Positive for dizziness.  Psychiatric/Behavioral: Positive for dysphoric mood.  All other systems reviewed and are negative.    Allergies  Review of patient's allergies indicates no known allergies.  Home Medications   Current Outpatient Rx  Name  Route  Sig  Dispense  Refill  . atenolol (TENORMIN) 100 MG tablet   Oral   Take 100 mg by mouth daily.          . camphor-menthol (SARNA) lotion   Topical   Apply 1 application topically 2 (two) times daily.         Marland Kitchen esomeprazole (NEXIUM) 40 MG capsule   Oral   Take 40 mg by mouth daily before breakfast.          . FLUoxetine (PROZAC) 20 MG capsule   Oral   Take 60 mg by mouth daily.          Marland Kitchen  fluticasone (FLOVENT HFA) 220 MCG/ACT inhaler   Inhalation   Inhale 1 puff into the lungs 2 (two) times daily as needed (wheezing and shortess of breath).          . gabapentin (NEURONTIN) 100 MG capsule   Oral   Take 100 mg by mouth 3 (three) times daily.         . hydrochlorothiazide (HYDRODIURIL) 25 MG tablet   Oral   Take 1 tablet (25 mg total) by mouth daily.   30 tablet   0   . PRESCRIPTION MEDICATION   Topical   Apply 1 application topically 2 (two) times daily. Cream compounded at [facility]  triamcinolone 0.1% 3 parts, silver sulfadiazine 1% 1 part Applies to entire body after a bath         . tacrolimus (PROTOPIC) 0.1 % ointment   Topical   Apply 1 application topically 2 (two) times daily as needed (Psoriasis).          . warfarin (COUMADIN) 5 MG tablet   Oral   Take 5 mg by mouth daily.          Marland Kitchen albuterol (VENTOLIN HFA) 108 (90 BASE) MCG/ACT inhaler   Inhalation   Inhale 2 puffs into the lungs 2 (two) times daily  as needed for wheezing or shortness of breath.          Marland Kitchen antipyrine-benzocaine (AURALGAN) otic solution   Both Ears   Place 2 drops into both ears daily as needed for pain.         . celecoxib (CELEBREX) 200 MG capsule   Oral   Take 200 mg by mouth daily as needed for pain.         . hydrOXYzine (ATARAX/VISTARIL) 10 MG tablet   Oral   Take 10 mg by mouth at bedtime as needed for itching. For psoriasis itching         . meclizine (ANTIVERT) 12.5 MG tablet   Oral   Take 1 tablet (12.5 mg total) by mouth 3 (three) times daily as needed.   15 tablet   0   . meclizine (ANTIVERT) 25 MG tablet   Oral   Take 25 mg by mouth 3 (three) times daily as needed for nausea.         . traMADol (ULTRAM) 50 MG tablet   Oral   Take 1 tablet (50 mg total) by mouth every 6 (six) hours as needed for pain.   15 tablet   0   . triamcinolone cream (KENALOG) 0.1 %   Topical   Apply 1 application topically 2 (two) times daily as needed. Entire body psoriasis           BP 110/65  Pulse 59  Temp(Src) 98.8 F (37.1 C) (Oral)  Resp 11  SpO2 100%  LMP 11/22/1996  Physical Exam  Nursing note and vitals reviewed. Constitutional: She is oriented to person, place, and time.  Tired, chronically ill.   HENT:  Head: Normocephalic.  Mouth/Throat: Oropharynx is clear and moist.  Eyes: Conjunctivae are normal. Pupils are equal, round, and reactive to light.  No nystagmus   Neck: Normal range of motion. Neck supple.  Cardiovascular: Normal rate, regular rhythm and normal heart sounds.   Pulmonary/Chest: Effort normal and breath sounds normal. No respiratory distress. She has no wheezes. She has no rales.  Abdominal: Soft. Bowel sounds are normal. She exhibits no distension. There is no tenderness. There is no rebound and no guarding.  Musculoskeletal: Normal range of motion.  Neurological: She is alert and oriented to person, place, and time.  Nl strength and sensation throughout. Able to  ambulate with assistance   Skin: Skin is warm and dry.  Psychiatric:  Depressed mood, not suicidal. No hallucinations     ED Course  Procedures (including critical care time)  Labs Reviewed  PROTIME-INR - Abnormal; Notable for the following:    Prothrombin Time 29.2 (*)    INR 2.95 (*)    All other components within normal limits  POCT I-STAT, CHEM 8 - Abnormal; Notable for the following:    Potassium 3.1 (*)    Creatinine, Ser 1.20 (*)    Glucose, Bld 110 (*)    All other components within normal limits  CBC WITH DIFFERENTIAL  POCT I-STAT TROPONIN I   No results found.   1. Dizziness   2. Dehydration   3. Chest pain   4. Depression   5. Hypokalemia      Date: 02/27/2013  Rate: 58  Rhythm: normal sinus rhythm  QRS Axis: normal  Intervals: normal  ST/T Wave abnormalities: normal  Conduction Disutrbances:none  Narrative Interpretation:   Old EKG Reviewed: unchanged    MDM  [name] [name] is a 72 y.o. female here with multiple complaints. Vertigo is chronic. Nonfocal neuro exam and I don't think she has a stroke and I don't think imaging is necessary. She felt better with IVF and reglan and meclizine. Her chest pain also chronic and EKG unremarkable and trop neg x 1. She is not orthostatic but her Cr increased a little suggesting that she may be slightly dehydrated. She was hydrated in the ED and told to take her meclizine. Return precautions given.          Richardean Canal, MD 02/27/13 [phone]

## 2013-02-27 NOTE — ED Notes (Signed)
Per EMS: pt has been expereincing vertigo for the past 4 years and tonight she wanted to be seen for her dizziness and vertigo. Pt has no other medical complaints

## 2013-03-01 ENCOUNTER — Encounter (HOSPITAL_COMMUNITY): Payer: Self-pay | Admitting: *Deleted

## 2013-03-01 ENCOUNTER — Emergency Department (HOSPITAL_COMMUNITY): Payer: PRIVATE HEALTH INSURANCE

## 2013-03-01 ENCOUNTER — Emergency Department (HOSPITAL_COMMUNITY)
Admission: EM | Admit: 2013-03-01 | Discharge: 2013-03-01 | Disposition: A | Discharge status: Other Acute Inpatient Hospital | Payer: PRIVATE HEALTH INSURANCE | Attending: Emergency Medicine | Admitting: Emergency Medicine

## 2013-03-01 DIAGNOSIS — R45851 Suicidal ideations: Secondary | ICD-10-CM | POA: Insufficient documentation

## 2013-03-01 DIAGNOSIS — R45850 Homicidal ideations: Secondary | ICD-10-CM | POA: Insufficient documentation

## 2013-03-01 DIAGNOSIS — I809 Phlebitis and thrombophlebitis of unspecified site: Secondary | ICD-10-CM

## 2013-03-01 DIAGNOSIS — Z008 Encounter for other general examination: Secondary | ICD-10-CM

## 2013-03-01 LAB — URINALYSIS, ROUTINE W REFLEX MICROSCOPIC
Protein, ur: NEGATIVE mg/dL
Urobilinogen, UA: 1 mg/dL (ref 0.0–1.0)

## 2013-03-01 LAB — APTT: aPTT: 52 seconds — ABNORMAL HIGH (ref 24–37)

## 2013-03-01 LAB — COMPREHENSIVE METABOLIC PANEL
ALT: 36 U/L — ABNORMAL HIGH (ref 0–35)
Alkaline Phosphatase: 110 U/L (ref 39–117)
CO2: 32 mEq/L (ref 19–32)
GFR calc Af Amer: 84 mL/min — ABNORMAL LOW (ref >90)
GFR calc non Af Amer: 72 mL/min — ABNORMAL LOW (ref >90)
Glucose, Bld: 110 mg/dL — ABNORMAL HIGH (ref 70–99)
Potassium: 3.7 mEq/L (ref 3.5–5.1)
Sodium: 138 mEq/L (ref 135–145)
Total Bilirubin: 0.2 mg/dL — ABNORMAL LOW (ref 0.3–1.2)

## 2013-03-01 LAB — PROTIME-INR
INR: 2.73 — ABNORMAL HIGH (ref 0.00–1.49)
Prothrombin Time: 27.6 seconds — ABNORMAL HIGH (ref 11.6–15.2)

## 2013-03-01 LAB — CBC
HCT: 37 % (ref 36.0–46.0)
Hemoglobin: 12.5 g/dL (ref 12.0–15.0)
RBC: 3.99 MIL/uL (ref 3.87–5.11)

## 2013-03-01 LAB — URINE MICROSCOPIC-ADD ON

## 2013-03-01 MED ORDER — HYDROMORPHONE HCL PF 2 MG/ML IJ SOLN
2.0000 mg | Freq: Once | INTRAMUSCULAR | Status: AC
  Administered 2013-03-01: 2 mg via INTRAMUSCULAR
  Filled 2013-03-01: qty 1

## 2013-03-01 NOTE — ED Notes (Signed)
Pt from [facility] for med clearance, pt to return after clearance, pt with IVC papers S/I and H/i, multiple health complaints, Headache with ear pain, pt rambling in conversation

## 2013-03-02 ENCOUNTER — Emergency Department (HOSPITAL_COMMUNITY)
Admission: EM | Admit: 2013-03-02 | Discharge: 2013-03-03 | Disposition: A | Discharge status: Home / Self Care | Payer: PRIVATE HEALTH INSURANCE | Attending: Dermatology | Admitting: Dermatology

## 2013-03-02 ENCOUNTER — Encounter (HOSPITAL_COMMUNITY): Payer: Self-pay | Admitting: Emergency Medicine

## 2013-03-02 DIAGNOSIS — Z8679 Personal history of other diseases of the circulatory system: Secondary | ICD-10-CM | POA: Insufficient documentation

## 2013-03-02 DIAGNOSIS — R45851 Suicidal ideations: Secondary | ICD-10-CM | POA: Insufficient documentation

## 2013-03-02 DIAGNOSIS — F3289 Other specified depressive episodes: Secondary | ICD-10-CM | POA: Insufficient documentation

## 2013-03-02 DIAGNOSIS — Z7901 Long term (current) use of anticoagulants: Secondary | ICD-10-CM | POA: Insufficient documentation

## 2013-03-02 DIAGNOSIS — IMO0002 Reserved for concepts with insufficient information to code with codable children: Secondary | ICD-10-CM | POA: Insufficient documentation

## 2013-03-02 DIAGNOSIS — Z862 Personal history of diseases of the blood and blood-forming organs and certain disorders involving the immune mechanism: Secondary | ICD-10-CM | POA: Insufficient documentation

## 2013-03-02 DIAGNOSIS — J45909 Unspecified asthma, uncomplicated: Secondary | ICD-10-CM | POA: Insufficient documentation

## 2013-03-02 DIAGNOSIS — Z79899 Other long term (current) drug therapy: Secondary | ICD-10-CM | POA: Insufficient documentation

## 2013-03-02 DIAGNOSIS — Z8739 Personal history of other diseases of the musculoskeletal system and connective tissue: Secondary | ICD-10-CM | POA: Insufficient documentation

## 2013-03-02 DIAGNOSIS — Z8669 Personal history of other diseases of the nervous system and sense organs: Secondary | ICD-10-CM | POA: Insufficient documentation

## 2013-03-02 DIAGNOSIS — G8929 Other chronic pain: Secondary | ICD-10-CM | POA: Insufficient documentation

## 2013-03-02 DIAGNOSIS — K219 Gastro-esophageal reflux disease without esophagitis: Secondary | ICD-10-CM | POA: Insufficient documentation

## 2013-03-02 DIAGNOSIS — G4730 Sleep apnea, unspecified: Secondary | ICD-10-CM | POA: Insufficient documentation

## 2013-03-02 DIAGNOSIS — Z872 Personal history of diseases of the skin and subcutaneous tissue: Secondary | ICD-10-CM | POA: Insufficient documentation

## 2013-03-02 DIAGNOSIS — F209 Schizophrenia, unspecified: Secondary | ICD-10-CM | POA: Insufficient documentation

## 2013-03-02 DIAGNOSIS — F419 Anxiety disorder, unspecified: Secondary | ICD-10-CM

## 2013-03-02 DIAGNOSIS — I10 Essential (primary) hypertension: Secondary | ICD-10-CM | POA: Insufficient documentation

## 2013-03-02 DIAGNOSIS — Z86711 Personal history of pulmonary embolism: Secondary | ICD-10-CM | POA: Insufficient documentation

## 2013-03-02 DIAGNOSIS — F411 Generalized anxiety disorder: Secondary | ICD-10-CM | POA: Insufficient documentation

## 2013-03-02 DIAGNOSIS — F329 Major depressive disorder, single episode, unspecified: Secondary | ICD-10-CM | POA: Insufficient documentation

## 2013-03-02 DIAGNOSIS — R51 Headache: Secondary | ICD-10-CM | POA: Insufficient documentation

## 2013-03-02 DIAGNOSIS — Z8639 Personal history of other endocrine, nutritional and metabolic disease: Secondary | ICD-10-CM | POA: Insufficient documentation

## 2013-03-02 LAB — CBC
HCT: 37.3 % (ref 36.0–46.0)
Hemoglobin: 12.1 g/dL (ref 12.0–15.0)
MCHC: 32.4 g/dL (ref 30.0–36.0)

## 2013-03-02 LAB — RAPID URINE DRUG SCREEN, HOSP PERFORMED: Barbiturates: NOT DETECTED

## 2013-03-02 LAB — COMPREHENSIVE METABOLIC PANEL
Alkaline Phosphatase: 115 U/L (ref 39–117)
BUN: 7 mg/dL (ref 6–23)
Chloride: 97 mEq/L (ref 96–112)
GFR calc Af Amer: 80 mL/min — ABNORMAL LOW (ref >90)
GFR calc non Af Amer: 69 mL/min — ABNORMAL LOW (ref >90)
Glucose, Bld: 122 mg/dL — ABNORMAL HIGH (ref 70–99)
Potassium: 3.4 mEq/L — ABNORMAL LOW (ref 3.5–5.1)
Total Bilirubin: 0.2 mg/dL — ABNORMAL LOW (ref 0.3–1.2)
Total Protein: 8.2 g/dL (ref 6.0–8.3)

## 2013-03-02 LAB — ACETAMINOPHEN LEVEL: Acetaminophen (Tylenol), Serum: <15 ug/mL (ref 10–30)

## 2013-03-02 LAB — ETHANOL: Alcohol, Ethyl (B): <11 mg/dL (ref 0–11)

## 2013-03-02 MED ORDER — ONDANSETRON HCL 4 MG PO TABS: 4.0000 mg | ORAL_TABLET | Freq: Three times a day (TID) | ORAL | Status: DC | PRN

## 2013-03-02 MED ORDER — ZOLPIDEM TARTRATE 5 MG PO TABS: 5.0000 mg | ORAL_TABLET | Freq: Every evening | ORAL | Status: DC | PRN

## 2013-03-02 MED ORDER — LORAZEPAM 1 MG PO TABS
1.0000 mg | ORAL_TABLET | Freq: Three times a day (TID) | ORAL | Status: DC | PRN
  Filled 2013-03-02: qty 1

## 2013-03-02 MED ORDER — IBUPROFEN 200 MG PO TABS
600.0000 mg | ORAL_TABLET | Freq: Three times a day (TID) | ORAL | Status: DC | PRN
  Filled 2013-03-02: qty 3

## 2013-03-02 MED ORDER — ALUM & MAG HYDROXIDE-SIMETH 200-200-20 MG/5ML PO SUSP: 30.0000 mL | ORAL | Status: DC | PRN

## 2013-03-02 MED ORDER — NICOTINE 21 MG/24HR TD PT24
21.0000 mg | MEDICATED_PATCH | Freq: Every day | TRANSDERMAL | Status: DC
Start: 2013-03-03 — End: 2013-03-03

## 2013-03-02 NOTE — ED Notes (Signed)
Irving Burton, RN requested to hold patient for a moment until she can transfer another patient to admission floor.

## 2013-03-02 NOTE — ED Provider Notes (Signed)
History     CSN: [id number]  Arrival date & time 03/02/13  2114   First MD Initiated Contact with Patient 03/02/13 2248      Chief Complaint  Patient presents with  . Medical Clearance    (Consider location/radiation/quality/duration/timing/severity/associated sxs/prior treatment) HPI Comments: 72 y/o female with a PMHx of depression and schizophrenia among multiple other medical problems brought into the ED via [employer] from [facility] under IVC due to expressing suicidal ideations with a plan of shooting herself and her disabled son. She was seen in the ED yesterday, sent back to [facility], discharged from [facility] and returned there around 4:00 pm expressing these suicidal thoughts, sent to the ED and is not to return to [facility] because she needs CPAP at night. Patient currently denying suicidal thoughts, states she does not know why she is here, and is rambling about her history of life. Patient qualifies as a level 5 caveat due to mental illness.  The history is provided by the patient and medical records.    Past Medical History  Diagnosis Date  . Depression   . Schizophrenia   . Arthritis   . Hyperlipidemia   . Hypertension   . Chronic pain   . 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 DVT (deep venous thrombosis)     per 01/17/10 d/c summary- "remote hx of dvt"?  . Iron deficiency anemia   . Chronic headaches   . Sleep apnea   . Glaucoma   . Hemorrhoids   . Asthma   . Anxiety   . GERD (gastroesophageal reflux disease)   . Psoriasis   . Psoriasis 04/29/2012    New onset evaluated by Dr Marylou Flesher, Dermatology, [facility] 6/13  Rx 0.1% Tacrolimus ointment    Past Surgical History  Procedure Laterality Date  . Spinal fusion      2004  . Joint replacement  right knee 11/2008  . Tubal ligation  year 11  . Carpal tunnel release      bilateral     Family History  Problem Relation Age of Onset  . Diabetes Sister   . Colon cancer Brother 40     History  Substance Use Topics  . Smoking status: Never Smoker   . Smokeless tobacco: Never Used  . Alcohol Use: No    OB History   Grav Para Term Preterm Abortions TAB SAB Ect Mult Living                  Review of Systems  Unable to perform ROS: Psychiatric disorder    Allergies  Review of patient's allergies indicates no known allergies.  Home Medications   Current Outpatient Rx  Name  Route  Sig  Dispense  Refill  . albuterol (VENTOLIN HFA) 108 (90 BASE) MCG/ACT inhaler   Inhalation   Inhale 2 puffs into the lungs 2 (two) times daily as needed for wheezing or shortness of breath.          Marland Kitchen antipyrine-benzocaine (AURALGAN) otic solution   Both Ears   Place 2 drops into both ears daily as needed for pain.         Marland Kitchen atenolol (TENORMIN) 100 MG tablet   Oral   Take 100 mg by mouth daily.          . camphor-menthol (SARNA) lotion   Topical   Apply 1 application topically 2 (two) times daily.         Marland Kitchen  esomeprazole (NEXIUM) 40 MG capsule   Oral   Take 40 mg by mouth daily before breakfast.          . FLUoxetine (PROZAC) 20 MG capsule   Oral   Take 60 mg by mouth daily.          . fluticasone (FLOVENT HFA) 220 MCG/ACT inhaler   Inhalation   Inhale 1 puff into the lungs 2 (two) times daily as needed (wheezing and shortness of breath).          . gabapentin (NEURONTIN) 100 MG capsule   Oral   Take 100 mg by mouth 3 (three) times daily.         . hydrochlorothiazide (HYDRODIURIL) 25 MG tablet   Oral   Take 1 tablet (25 mg total) by mouth daily.   30 tablet   0   . hydrOXYzine (ATARAX/VISTARIL) 10 MG tablet   Oral   Take 10 mg by mouth at bedtime as needed for itching. For psoriasis itching         . meclizine (ANTIVERT) 25 MG tablet   Oral   Take 25 mg by mouth 3 (three) times daily as needed for nausea.         Marland Kitchen PRESCRIPTION MEDICATION   Topical   Apply 1 application topically 2 (two) times daily. Cream compounded at [facility]   triamcinolone 0.1% 3 parts, silver sulfadiazine 1% 1 part Applies to entire body after a bath         . tacrolimus (PROTOPIC) 0.1 % ointment   Topical   Apply 1 application topically 2 (two) times daily as needed (Psoriasis).          . warfarin (COUMADIN) 5 MG tablet   Oral   Take 5 mg by mouth daily.            BP 162/94  Pulse 62  Temp(Src) 98.4 F (36.9 C) (Oral)  Resp 18  SpO2 96%  LMP 11/22/1996  Physical Exam  Nursing note and vitals reviewed. Constitutional: She is oriented to person, place, and time. She appears well-developed and well-nourished. No distress.  HENT:  Head: Normocephalic and atraumatic.  Mouth/Throat: Oropharynx is clear and moist.  Eyes: Conjunctivae are normal.  Neck: Normal range of motion. Neck supple.  Cardiovascular: Normal rate, regular rhythm and normal heart sounds.   Pulmonary/Chest: Effort normal and breath sounds normal.  Musculoskeletal: Normal range of motion. She exhibits no edema.  Neurological: She is alert and oriented to person, place, and time.  Skin: Skin is warm and dry. She is not diaphoretic.  Psychiatric: Her speech is tangential. She exhibits a depressed mood. She expresses homicidal and suicidal ideation. She expresses suicidal plans and homicidal plans.    ED Course  Procedures (including critical care time)  Labs Reviewed  COMPREHENSIVE METABOLIC PANEL - Abnormal; Notable for the following:    Potassium 3.4 (*)    Glucose, Bld 122 (*)    Albumin 3.4 (*)    Total Bilirubin 0.2 (*)    GFR calc non Af Amer 69 (*)    GFR calc Af Amer 80 (*)    All other components within normal limits  SALICYLATE LEVEL - Abnormal; Notable for the following:    Salicylate Lvl <2.0 (*)    All other components within normal limits  ACETAMINOPHEN LEVEL  CBC  ETHANOL  URINE RAPID DRUG SCREEN (HOSP PERFORMED)   Dg Chest 2 View  03/01/2013   *RADIOLOGY REPORT*  Clinical Data: Medical  clearance.  History of hypertension.  CHEST  - 2 VIEW  Comparison: 01/21/2013  Findings: Mild pulmonary hyperinflation suggesting emphysema. Scattered fibrosis in the lungs. The heart size and pulmonary vascularity are normal. The lungs appear clear and expanded without focal air space disease or consolidation. No blunting of the costophrenic angles.  Tortuous aorta.  No pneumothorax. Mediastinal contours appear intact.  Mild degenerative changes in the thoracic spine.  No significant change since previous study.  IMPRESSION: Emphysematous changes and scattered fibrosis in the lungs.  No evidence of active pulmonary disease.   Original Report Authenticated By: Burman Nieves, M.D.     1. Suicidal ideation       MDM  72 y/o female under IVC, SI/HI with plan to shoot herself and son. Obtaining psych labs, telepsych.  12:43 AM Patient medically cleared. CPAP ordered for at night.  Trevor Mace, PA-C 03/03/13 [phone]

## 2013-03-02 NOTE — ED Notes (Signed)
Patient is here under IVC paper work

## 2013-03-03 NOTE — ED Notes (Signed)
Telepsych in process at this time.

## 2013-03-03 NOTE — ED Notes (Signed)
All dc orders and instructions reviewed with pt. All her belongings have been returned and a ride called for her. VSS, NAD, denies SI/HI.

## 2013-03-03 NOTE — ED Notes (Signed)
Writer contacted Judie Bonus, RN at [employer] to inform her that a set of original IVC paper work was missing. Sameldra, RN reported that she would look for for papers and contact communications to delivery them.

## 2013-03-03 NOTE — ED Notes (Signed)
LIsa, RN CN informed that patient was sent from [facility] due to fact that she needed to be on C-pap to sleep. Misty Stanley, CN stated to move patient to TCU because she forgot that she got a phone call about patient.

## 2013-03-03 NOTE — Progress Notes (Signed)
Went to setup CPAP for pt and patient refused.

## 2013-03-03 NOTE — ED Notes (Signed)
Pt c/o chronic back pain, requesting pain medication, offered patient ibuprofen as ordered and ativan to help her rest, pt refused both medications.

## 2013-03-03 NOTE — ED Notes (Signed)
Sameldra,RN Regulatory affairs officer to inform her that she found missing IVC paper work and was send them by [employer].

## 2013-03-03 NOTE — ED Notes (Signed)
Pt denies SI at this time, states that she only said that so that she could stay at [facility] but it backfired on her because the "place was so nasty". Pt states she is ready to go home and she does not wish to return to [place]. Explained to patient about waiting for telepsych consult.

## 2013-03-03 NOTE — ED Provider Notes (Signed)
Pt has been seen by psychiatrist, Dr Jacky Kindle, he indicates pt psych clear for discharge.  Recheck pt, normal mood/affect. States had made statements earlier she did not mean, and she has no intent to hurt self or others/family.   Suzi Roots, MD 03/03/13 [phone]

## 2013-03-03 NOTE — ED Notes (Signed)
Pt refusing to wear CPAP for respiratory therapy.

## 2013-03-06 NOTE — ED Provider Notes (Signed)
History     CSN: [id number]  Arrival date & time 03/01/13  2129   First MD Initiated Contact with Patient 03/01/13 2203      Chief Complaint  Patient presents with  . Medical Clearance    (Consider location/radiation/quality/duration/timing/severity/associated sxs/prior treatment) HPI  Patient with hx of schizophrenia, depression presents for [facility] for med clearance. Med hx also includes hx of PE and hypercoagulability disorder for which she is on coumadin. The patient traige labes show that she is therapeutic on her INR. Patient is a poor historian but states that she has pain in her left calf, and headache that has been present for months. Also c/o intermittent, aching, non-radiating ear pain in the right ear. She applied sweet oil wtihout relief. Denies fevers, chills, myalgias, arthralgias. Denies DOE, SOB, chest tightness or pressure, radiation to left arm, jaw or back, or diaphoresis. Denies dysuria, flank pain, suprapubic pain, frequency, urgency, or hematuria. Denies , light headedness, weakness, visual disturbances. Denies abdominal pain, nausea, vomiting, diarrhea or constipation. Patient has paranoid thoughts i focused on her husband stealing her money. She attributes her depression and SI to this. She also thinks of killing her husband.  Patient is pleasant and coopoerative during her H&P   Past Medical History  Diagnosis Date  . Depression   . Schizophrenia   . Arthritis   . Hyperlipidemia   . Hypertension   . Chronic pain   . 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 DVT (deep venous thrombosis)     per 01/17/10 d/c summary- "remote hx of dvt"?  . Iron deficiency anemia   . Chronic headaches   . Sleep apnea   . Glaucoma   . Hemorrhoids   . Asthma   . Anxiety   . GERD (gastroesophageal reflux disease)   . Psoriasis   . Psoriasis 04/29/2012    New onset evaluated by Dr Marylou Flesher, Dermatology, [facility] 6/13  Rx 0.1% Tacrolimus ointment     Past Surgical History  Procedure Laterality Date  . Spinal fusion      2004  . Joint replacement  right knee 11/2008  . Tubal ligation  year 1  . Carpal tunnel release      bilateral     Family History  Problem Relation Age of Onset  . Diabetes Sister   . Colon cancer Brother 40    History  Substance Use Topics  . Smoking status: Never Smoker   . Smokeless tobacco: Never Used  . Alcohol Use: No    OB History   Grav Para Term Preterm Abortions TAB SAB Ect Mult Living                  Review of Systems  Constitutional: Negative for fever and chills.  HENT: Positive for ear pain. Negative for trouble swallowing, neck pain and neck stiffness.   Respiratory: Negative for shortness of breath.   Cardiovascular: Negative for chest pain and palpitations.  Gastrointestinal: Negative for nausea, vomiting, abdominal pain, diarrhea and constipation.  Genitourinary: Negative for dysuria and hematuria.  Musculoskeletal: Negative for myalgias and arthralgias.  Skin: Negative for rash.  Neurological: Negative for numbness.  All other systems reviewed and are negative.    Allergies  Review of patient's allergies indicates no known allergies.  Home Medications   Current Outpatient Rx  Name  Route  Sig  Dispense  Refill  . albuterol (VENTOLIN HFA) 108 (90 BASE) MCG/ACT inhaler  Inhalation   Inhale 2 puffs into the lungs 2 (two) times daily as needed for wheezing or shortness of breath.          Marland Kitchen antipyrine-benzocaine (AURALGAN) otic solution   Both Ears   Place 2 drops into both ears daily as needed for pain.         Marland Kitchen atenolol (TENORMIN) 100 MG tablet   Oral   Take 100 mg by mouth daily.          . camphor-menthol (SARNA) lotion   Topical   Apply 1 application topically 2 (two) times daily.         Marland Kitchen esomeprazole (NEXIUM) 40 MG capsule   Oral   Take 40 mg by mouth daily before breakfast.          . FLUoxetine (PROZAC) 20 MG capsule   Oral    Take 60 mg by mouth daily.          . fluticasone (FLOVENT HFA) 220 MCG/ACT inhaler   Inhalation   Inhale 1 puff into the lungs 2 (two) times daily as needed (wheezing and shortness of breath).          . gabapentin (NEURONTIN) 100 MG capsule   Oral   Take 100 mg by mouth 3 (three) times daily.         . hydrochlorothiazide (HYDRODIURIL) 25 MG tablet   Oral   Take 1 tablet (25 mg total) by mouth daily.   30 tablet   0   . hydrOXYzine (ATARAX/VISTARIL) 10 MG tablet   Oral   Take 10 mg by mouth at bedtime as needed for itching. For psoriasis itching         . meclizine (ANTIVERT) 25 MG tablet   Oral   Take 25 mg by mouth 3 (three) times daily as needed for nausea.         Marland Kitchen PRESCRIPTION MEDICATION   Topical   Apply 1 application topically 2 (two) times daily. Cream compounded at [facility]  triamcinolone 0.1% 3 parts, silver sulfadiazine 1% 1 part Applies to entire body after a bath         . tacrolimus (PROTOPIC) 0.1 % ointment   Topical   Apply 1 application topically 2 (two) times daily as needed (Psoriasis).          . warfarin (COUMADIN) 5 MG tablet   Oral   Take 5 mg by mouth daily.            BP 151/92  Pulse 81  Temp(Src) 99 F (37.2 C) (Oral)  Resp 18  Ht 5\' 5"  (1.651 m)  Wt 246 lb 6 oz (111.755 kg)  BMI 41 kg/m2  SpO2 98%  LMP 11/22/1996  Physical Exam  Nursing note and vitals reviewed. Constitutional: She is oriented to person, place, and time. She appears well-developed and well-nourished. No distress.  HENT:  Head: Normocephalic and atraumatic.  Right Ear: External ear normal.  Left Ear: External ear normal.  Mouth/Throat: No oropharyngeal exudate.  R/L TM normal large amount of cerumen. No impaction  Eyes: Conjunctivae are normal. Pupils are equal, round, and reactive to light. No scleral icterus.  Neck: Normal range of motion. Neck supple. No JVD present. No thyromegaly present.  Cardiovascular: Normal rate, regular rhythm,  normal heart sounds and intact distal pulses.  Exam reveals no gallop and no friction rub.   No murmur heard. Multiple dilated and warm superficial varicoses on the R calf. Homan's negative. No peripheral  edema. No UL leg swelling  Pulmonary/Chest: Effort normal and breath sounds normal. No respiratory distress.  Abdominal: Soft. Bowel sounds are normal. She exhibits no distension and no mass. There is no tenderness. There is no guarding.  Musculoskeletal: Normal range of motion. She exhibits no edema and no tenderness.  No meningismus  Neurological: She is alert and oriented to person, place, and time. She has normal reflexes. No cranial nerve deficit. Coordination normal.  Skin: Skin is warm and dry. No rash noted. She is not diaphoretic.    ED Course  Procedures (including critical care time)  Labs Reviewed  COMPREHENSIVE METABOLIC PANEL - Abnormal; Notable for the following:    Glucose, Bld 110 (*)    AST 45 (*)    ALT 36 (*)    Total Bilirubin 0.2 (*)    GFR calc non Af Amer 72 (*)    GFR calc Af Amer 84 (*)    All other components within normal limits  PROTIME-INR - Abnormal; Notable for the following:    Prothrombin Time 27.6 (*)    INR 2.73 (*)    All other components within normal limits  APTT - Abnormal; Notable for the following:    aPTT 52 (*)    All other components within normal limits  URINALYSIS, ROUTINE W REFLEX MICROSCOPIC - Abnormal; Notable for the following:    Hgb urine dipstick SMALL (*)    All other components within normal limits  CBC  URINE RAPID DRUG SCREEN (HOSP PERFORMED)  URINE MICROSCOPIC-ADD ON   No results found.   1. Medical clearance for psychiatric admission   2. Phlebitis       MDM   Patient with hx hypercoagulability with clinical evidence of PE and no complaints of CP, SOB. SHe is therapeutic on her INR. Patient has elevated glucose and mildly elevated transaminases. PE is consistent with superficial thrombophlebitis of the R  leg. No signs of ear infections. SUSpect tension headaches. Pain medicine given here. Patient appears safe to d/c to [facility]. Supportive care discussed. The patient appears reasonably screened and/or stabilized for discharge and I doubt any other medical condition or other [facility] requiring further screening, evaluation, or treatment in the ED at this time prior to discharge.      Arthor Captain, PA-C 03/06/13 1529

## 2013-03-06 NOTE — ED Provider Notes (Signed)
Medical screening examination/treatment/procedure(s) were conducted as a shared visit with non-physician practitioner(s) and myself.  I personally evaluated the patient during the encounter Pt states hx depression. States earlier was feeling stressed and upset, and made statements out of frustration. On recheck x 2, pt w normal mood/affect.  Denies thoughts of harm to self or others. Expresses optimism about going home, caring for son, and following up closely w her doctor/therapist.   Suzi Roots, MD 03/06/13 [phone]

## 2013-03-07 NOTE — ED Provider Notes (Signed)
Medical screening examination/treatment/procedure(s) were performed by non-physician practitioner and as supervising physician I was immediately available for consultation/collaboration.   Gilda Crease, MD 03/07/13 1101

## 2013-03-10 ENCOUNTER — Other Ambulatory Visit: Payer: PRIVATE HEALTH INSURANCE | Admitting: Lab

## 2013-03-10 ENCOUNTER — Ambulatory Visit: Payer: PRIVATE HEALTH INSURANCE

## 2013-03-18 ENCOUNTER — Emergency Department (HOSPITAL_COMMUNITY)
Admission: EM | Admit: 2013-03-18 | Discharge: 2013-03-18 | Disposition: A | Discharge status: Home / Self Care | Payer: PRIVATE HEALTH INSURANCE | Attending: Emergency Medicine | Admitting: Emergency Medicine

## 2013-03-18 ENCOUNTER — Encounter (HOSPITAL_COMMUNITY): Payer: Self-pay

## 2013-03-18 ENCOUNTER — Emergency Department (HOSPITAL_COMMUNITY): Payer: PRIVATE HEALTH INSURANCE

## 2013-03-18 DIAGNOSIS — H409 Unspecified glaucoma: Secondary | ICD-10-CM | POA: Insufficient documentation

## 2013-03-18 DIAGNOSIS — S0990XA Unspecified injury of head, initial encounter: Secondary | ICD-10-CM | POA: Insufficient documentation

## 2013-03-18 DIAGNOSIS — R1013 Epigastric pain: Secondary | ICD-10-CM | POA: Insufficient documentation

## 2013-03-18 DIAGNOSIS — W1809XA Striking against other object with subsequent fall, initial encounter: Secondary | ICD-10-CM | POA: Insufficient documentation

## 2013-03-18 DIAGNOSIS — Z9851 Tubal ligation status: Secondary | ICD-10-CM | POA: Insufficient documentation

## 2013-03-18 DIAGNOSIS — F411 Generalized anxiety disorder: Secondary | ICD-10-CM | POA: Insufficient documentation

## 2013-03-18 DIAGNOSIS — I10 Essential (primary) hypertension: Secondary | ICD-10-CM | POA: Insufficient documentation

## 2013-03-18 DIAGNOSIS — Z86711 Personal history of pulmonary embolism: Secondary | ICD-10-CM | POA: Insufficient documentation

## 2013-03-18 DIAGNOSIS — Z8639 Personal history of other endocrine, nutritional and metabolic disease: Secondary | ICD-10-CM | POA: Insufficient documentation

## 2013-03-18 DIAGNOSIS — R42 Dizziness and giddiness: Secondary | ICD-10-CM

## 2013-03-18 DIAGNOSIS — IMO0002 Reserved for concepts with insufficient information to code with codable children: Secondary | ICD-10-CM | POA: Insufficient documentation

## 2013-03-18 DIAGNOSIS — Y9389 Activity, other specified: Secondary | ICD-10-CM | POA: Insufficient documentation

## 2013-03-18 DIAGNOSIS — R609 Edema, unspecified: Secondary | ICD-10-CM | POA: Insufficient documentation

## 2013-03-18 DIAGNOSIS — F209 Schizophrenia, unspecified: Secondary | ICD-10-CM | POA: Insufficient documentation

## 2013-03-18 DIAGNOSIS — Z86718 Personal history of other venous thrombosis and embolism: Secondary | ICD-10-CM | POA: Insufficient documentation

## 2013-03-18 DIAGNOSIS — Z872 Personal history of diseases of the skin and subcutaneous tissue: Secondary | ICD-10-CM | POA: Insufficient documentation

## 2013-03-18 DIAGNOSIS — Z7901 Long term (current) use of anticoagulants: Secondary | ICD-10-CM

## 2013-03-18 DIAGNOSIS — G4730 Sleep apnea, unspecified: Secondary | ICD-10-CM | POA: Insufficient documentation

## 2013-03-18 DIAGNOSIS — Y929 Unspecified place or not applicable: Secondary | ICD-10-CM | POA: Insufficient documentation

## 2013-03-18 DIAGNOSIS — W19XXXA Unspecified fall, initial encounter: Secondary | ICD-10-CM

## 2013-03-18 DIAGNOSIS — J45909 Unspecified asthma, uncomplicated: Secondary | ICD-10-CM | POA: Insufficient documentation

## 2013-03-18 DIAGNOSIS — Z862 Personal history of diseases of the blood and blood-forming organs and certain disorders involving the immune mechanism: Secondary | ICD-10-CM | POA: Insufficient documentation

## 2013-03-18 DIAGNOSIS — G8929 Other chronic pain: Secondary | ICD-10-CM | POA: Insufficient documentation

## 2013-03-18 DIAGNOSIS — Z8679 Personal history of other diseases of the circulatory system: Secondary | ICD-10-CM | POA: Insufficient documentation

## 2013-03-18 DIAGNOSIS — K219 Gastro-esophageal reflux disease without esophagitis: Secondary | ICD-10-CM

## 2013-03-18 DIAGNOSIS — Z79899 Other long term (current) drug therapy: Secondary | ICD-10-CM | POA: Insufficient documentation

## 2013-03-18 DIAGNOSIS — M129 Arthropathy, unspecified: Secondary | ICD-10-CM | POA: Insufficient documentation

## 2013-03-18 DIAGNOSIS — Z981 Arthrodesis status: Secondary | ICD-10-CM | POA: Insufficient documentation

## 2013-03-18 MED ORDER — TRAMADOL HCL 50 MG PO TABS
50.0000 mg | ORAL_TABLET | Freq: Once | ORAL | Status: AC
  Administered 2013-03-18: 50 mg via ORAL
  Filled 2013-03-18: qty 1

## 2013-03-18 MED ORDER — PANTOPRAZOLE SODIUM 40 MG PO TBEC
40.0000 mg | DELAYED_RELEASE_TABLET | Freq: Every day | ORAL | Status: DC
  Administered 2013-03-18: 40 mg via ORAL
  Filled 2013-03-18: qty 1

## 2013-03-18 NOTE — ED Notes (Signed)
Pt states that she has been out of her Nexium and  Her vertigo meds for two days. Pt states that they are at the drug store but with the recent death in the family she wasn't able to get to the pharmacy today to get her Rx.   Pt c/o heart burn in epigastric area as well as dizziness that has been going on for two days.

## 2013-03-18 NOTE — ED Notes (Signed)
[id number]<AV> Expected date:<BR> Expected time:<BR> Means of arrival:<BR> Comments:<BR> ems/vertigo

## 2013-03-18 NOTE — ED Notes (Signed)
Per EMS pt c/o dizziness and weakness, out of vertigo meds x2 days, states under a lot of stress

## 2013-03-18 NOTE — ED Provider Notes (Signed)
I saw and evaluated the patient, reviewed the resident's note and I agree with the findings and plan.   .Face to face Exam:  General:  Awake HEENT:  Atraumatic Resp:  Normal effort Abd:  Nondistended Neuro:No focal weakness   Nelia Shi, MD 03/18/13 1911

## 2013-03-18 NOTE — ED Provider Notes (Signed)
History    CSN: [id number] Arrival date & time 03/18/13  1420 First MD Initiated Contact with Patient 03/18/13 1507     Chief Complaint  Patient presents with  . Dizziness   HPI  72 year old female with multiple complaints. She has follow up   #1 Vertigo leading to fall- Was sitting on toilet on Monday and was feeling some vertigo. She has chronic vertigo and apparently is being cared for by a neurologist. Has run out of meclizine. She ended up leaning forward and hitting the left side of her her head on the tub. She has had a moderate headache since that time and she took tylenol without relief. Has history of headaches intermittently. Denies extremity weakness, blurry vision, facial weakness. No fever/chills. Is on coumadin for history DVT/PE.   #2 GERD-ran out of reflux medicine and didn't have # to get it until tomorrow. Epigastric burning worse with food. Non exertional. Not relieved by rest. Worse by lying back. Does have SOB related to #3.   #3 Asthma-just got albuterol from PCP but states she didn't want to take something too strong so she avoided it. No fevers/chills. No increased respiratory effort. Main symptom is occasional cough and wheeze.   Past Medical History  Diagnosis Date  . Depression   . Schizophrenia   . Arthritis   . Hyperlipidemia   . Hypertension   . Chronic pain   . 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 DVT (deep venous thrombosis)     per 01/17/10 d/c summary- "remote hx of dvt"?  . Iron deficiency anemia   . Chronic headaches   . Sleep apnea   . Glaucoma   . Hemorrhoids   . Asthma   . Anxiety   . GERD (gastroesophageal reflux disease)   . Psoriasis   . Psoriasis 04/29/2012    New onset evaluated by Dr Marylou Flesher, Dermatology, [facility] 6/13  Rx 0.1% Tacrolimus ointment   Past Surgical History  Procedure Laterality Date  . Spinal fusion      2004  . Joint replacement  right knee 11/2008  . Tubal ligation  year 59   . Carpal tunnel release      bilateral    Family History  Problem Relation Age of Onset  . Diabetes Sister   . Colon cancer Brother 40   History  Substance Use Topics  . Smoking status: Never Smoker   . Smokeless tobacco: Never Used  . Alcohol Use: No   Review of Systems A full 10 point review of symptoms was performed and was negative except as noted in HPI.   Allergies  Review of patient's allergies indicates no known allergies.  Home Medications   Current Outpatient Rx  Name  Route  Sig  Dispense  Refill  . albuterol (VENTOLIN HFA) 108 (90 BASE) MCG/ACT inhaler   Inhalation   Inhale 2 puffs into the lungs 2 (two) times daily as needed for wheezing or shortness of breath.          Marland Kitchen antipyrine-benzocaine (AURALGAN) otic solution   Both Ears   Place 2 drops into both ears daily as needed for pain.         Marland Kitchen atenolol (TENORMIN) 100 MG tablet   Oral   Take 100 mg by mouth daily.          . camphor-menthol (SARNA) lotion   Topical   Apply 1 application topically 2 (two) times  daily.         . esomeprazole (NEXIUM) 40 MG capsule   Oral   Take 40 mg by mouth daily before breakfast.          . FLUoxetine (PROZAC) 20 MG capsule   Oral   Take 60 mg by mouth daily.          . fluticasone (FLOVENT HFA) 220 MCG/ACT inhaler   Inhalation   Inhale 1 puff into the lungs 2 (two) times daily as needed (wheezing and shortness of breath).          . gabapentin (NEURONTIN) 300 MG capsule   Oral   Take 300 mg by mouth at bedtime.         . hydrochlorothiazide (HYDRODIURIL) 25 MG tablet   Oral   Take 1 tablet (25 mg total) by mouth daily.   30 tablet   0   . hydrOXYzine (ATARAX/VISTARIL) 10 MG tablet   Oral   Take 10 mg by mouth at bedtime as needed for itching. For psoriasis itching         . lisinopril (PRINIVIL,ZESTRIL) 5 MG tablet   Oral   Take 5 mg by mouth daily.         . predniSONE (DELTASONE) 10 MG tablet   Oral   Take 10 mg by mouth  daily. Take 4 tabs for 2 days, take 3 tabs for 2 days, take 2 for 2 days and take 1 tab for 2 days         . predniSONE (DELTASONE) 20 MG tablet   Oral   Take 20 mg by mouth daily.         Marland Kitchen PRESCRIPTION MEDICATION   Topical   Apply 1 application topically 2 (two) times daily. Cream compounded at [facility]  triamcinolone 0.1% 3 parts, silver sulfadiazine 1% 1 part Applies to entire body after a bath         . tacrolimus (PROTOPIC) 0.1 % ointment   Topical   Apply 1 application topically 2 (two) times daily as needed (Psoriasis).          . warfarin (COUMADIN) 5 MG tablet   Oral   Take 5 mg by mouth daily.          . meclizine (ANTIVERT) 25 MG tablet   Oral   Take 25 mg by mouth 3 (three) times daily as needed for nausea.          BP 136/59  Pulse 85  Temp(Src) 98.5 F (36.9 C) (Oral)  Resp 19  SpO2 97%  LMP 11/22/1996 Physical Exam  Constitutional: She appears well-developed and well-nourished.  On 1L nasal cannula, when removed, oxygen remains >98%.   HENT:  Head: Normocephalic and atraumatic.  Mouth/Throat: Oropharynx is clear and moist.  Eyes: EOM are normal. Pupils are equal, round, and reactive to light.  Neck: Normal range of motion. Neck supple.  Cardiovascular: Normal rate and regular rhythm.  Exam reveals no gallop and no friction rub.   No murmur heard. Pulmonary/Chest: Effort normal and breath sounds normal. No respiratory distress. She has no wheezes. She has no rales.  Abdominal: Soft. Bowel sounds are normal. There is no tenderness. There is no rebound and no guarding.  Musculoskeletal: Normal range of motion. She exhibits edema (1+ bilateral, varicose veins noted bilaterally (chronic) ).  Neurological: She is alert. No cranial nerve deficit. She exhibits normal muscle tone. Coordination normal.  Oriented to person, place, reason for visit. Thought pattern  wanders.    Skin: Skin is warm and dry.    ED Course  Procedures (including critical  care time) Labs Reviewed - No data to display Ct Head Wo Contrast  03/18/2013   *RADIOLOGY REPORT*  Clinical Data: Dizziness  CT HEAD WITHOUT CONTRAST  Technique:  Contiguous axial images were obtained from the base of the skull through the vertex without contrast.  Comparison: 01/21/2013 and multiple previous  Findings: The brain has a normal appearance without evidence of old or acute infarction, mass lesion, hemorrhage, hydrocephalus or extra-axial collection.  The patient does have atherosclerotic calcification of the major vessels at the base of the brain.  The calvarium is unremarkable.  Visualized sinuses, middle ears and mastoids are clear.  No skull or skull base lesion seen.  IMPRESSION: No brain abnormality.  Atherosclerosis.   Original Report Authenticated By: Paulina Fusi, M.D.   1. Fall, initial encounter   2. On anticoagulant therapy   3. GERD (gastroesophageal reflux disease)   4. Vertigo     MDM  Fall on head on coumadin 2 days ago due to chronic vertigo because patient ran out of meclizine for chronic vertigo which is being managed by neurologist per patient. Nonfocal neuro exam. Orthostatics negative. Due to headache, CT head obtained and negative for acute intracranial abnormality.   Patient refuses refills on medications and states she has all of them at pharmacy and will pick them up tomorrow. She wanted a dose of PPI here and had relief of GERD symptoms. For asthma, advised patient she can in fact take albuterol that she has that her PCP prescribed when she feels SOB though she has no current complaints, no wheeze. Advised close follow up this week with PCP for all of the above issues.   Shelva Majestic, MD 03/18/13 [phone]

## 2013-03-19 ENCOUNTER — Ambulatory Visit: Payer: Self-pay | Admitting: Pharmacist

## 2013-03-19 DIAGNOSIS — Z7901 Long term (current) use of anticoagulants: Secondary | ICD-10-CM

## 2013-03-19 DIAGNOSIS — D6851 Activated protein C resistance: Secondary | ICD-10-CM

## 2013-03-19 DIAGNOSIS — I2699 Other pulmonary embolism without acute cor pulmonale: Secondary | ICD-10-CM

## 2013-03-19 NOTE — Progress Notes (Signed)
[name] [name] missed her lab/Coumadin clinic visit last week.  I found an INR from 03/01/13 = 2.73 (ED visit). She has been to the ED several times in the past month & her INR's have been at goal on 5 mg daily.  It seems it is easier to track her INR's based on ED visits than to actually get her to keep her appts for the Coumadin clinic recently. I will continue her same dose & plan to see her on 05/05/13 when she is scheduled to see Dr. Cyndie Chime same day. Ebony Hail, Pharm.D., CPP 03/19/2013@2 :18 PM

## 2013-03-30 ENCOUNTER — Encounter (HOSPITAL_COMMUNITY): Payer: Self-pay | Admitting: *Deleted

## 2013-03-30 ENCOUNTER — Institutional Professional Consult (permissible substitution): Payer: Self-pay | Admitting: Neurology

## 2013-03-30 ENCOUNTER — Observation Stay (HOSPITAL_COMMUNITY)
Admission: EM | Admit: 2013-03-30 | Discharge: 2013-04-02 | Disposition: A | Discharge status: Home / Self Care | Payer: PRIVATE HEALTH INSURANCE | Attending: Internal Medicine | Admitting: Internal Medicine

## 2013-03-30 DIAGNOSIS — R42 Dizziness and giddiness: Secondary | ICD-10-CM

## 2013-03-30 DIAGNOSIS — Z7189 Other specified counseling: Secondary | ICD-10-CM

## 2013-03-30 DIAGNOSIS — F209 Schizophrenia, unspecified: Secondary | ICD-10-CM

## 2013-03-30 DIAGNOSIS — L03116 Cellulitis of left lower limb: Secondary | ICD-10-CM

## 2013-03-30 DIAGNOSIS — F329 Major depressive disorder, single episode, unspecified: Secondary | ICD-10-CM

## 2013-03-30 DIAGNOSIS — K219 Gastro-esophageal reflux disease without esophagitis: Secondary | ICD-10-CM

## 2013-03-30 DIAGNOSIS — R748 Abnormal levels of other serum enzymes: Secondary | ICD-10-CM

## 2013-03-30 DIAGNOSIS — L0390 Cellulitis, unspecified: Secondary | ICD-10-CM

## 2013-03-30 DIAGNOSIS — E669 Obesity, unspecified: Secondary | ICD-10-CM

## 2013-03-30 DIAGNOSIS — Z7901 Long term (current) use of anticoagulants: Secondary | ICD-10-CM

## 2013-03-30 DIAGNOSIS — Z800 Family history of malignant neoplasm of digestive organs: Secondary | ICD-10-CM

## 2013-03-30 DIAGNOSIS — M7989 Other specified soft tissue disorders: Secondary | ICD-10-CM | POA: Insufficient documentation

## 2013-03-30 DIAGNOSIS — I951 Orthostatic hypotension: Secondary | ICD-10-CM

## 2013-03-30 DIAGNOSIS — Z86718 Personal history of other venous thrombosis and embolism: Secondary | ICD-10-CM | POA: Insufficient documentation

## 2013-03-30 DIAGNOSIS — R0989 Other specified symptoms and signs involving the circulatory and respiratory systems: Secondary | ICD-10-CM | POA: Insufficient documentation

## 2013-03-30 DIAGNOSIS — D649 Anemia, unspecified: Secondary | ICD-10-CM

## 2013-03-30 DIAGNOSIS — L02419 Cutaneous abscess of limb, unspecified: Principal | ICD-10-CM | POA: Insufficient documentation

## 2013-03-30 DIAGNOSIS — D6851 Activated protein C resistance: Secondary | ICD-10-CM

## 2013-03-30 DIAGNOSIS — L03115 Cellulitis of right lower limb: Secondary | ICD-10-CM

## 2013-03-30 DIAGNOSIS — R911 Solitary pulmonary nodule: Secondary | ICD-10-CM

## 2013-03-30 DIAGNOSIS — Z79899 Other long term (current) drug therapy: Secondary | ICD-10-CM | POA: Insufficient documentation

## 2013-03-30 DIAGNOSIS — R0609 Other forms of dyspnea: Secondary | ICD-10-CM | POA: Insufficient documentation

## 2013-03-30 DIAGNOSIS — N179 Acute kidney failure, unspecified: Secondary | ICD-10-CM

## 2013-03-30 DIAGNOSIS — R001 Bradycardia, unspecified: Secondary | ICD-10-CM

## 2013-03-30 DIAGNOSIS — D62 Acute posthemorrhagic anemia: Secondary | ICD-10-CM

## 2013-03-30 DIAGNOSIS — L409 Psoriasis, unspecified: Secondary | ICD-10-CM

## 2013-03-30 DIAGNOSIS — I10 Essential (primary) hypertension: Secondary | ICD-10-CM

## 2013-03-30 DIAGNOSIS — E785 Hyperlipidemia, unspecified: Secondary | ICD-10-CM

## 2013-03-30 DIAGNOSIS — F1490 Cocaine use, unspecified, uncomplicated: Secondary | ICD-10-CM

## 2013-03-30 DIAGNOSIS — K625 Hemorrhage of anus and rectum: Secondary | ICD-10-CM

## 2013-03-30 DIAGNOSIS — J45909 Unspecified asthma, uncomplicated: Secondary | ICD-10-CM

## 2013-03-30 DIAGNOSIS — R55 Syncope and collapse: Secondary | ICD-10-CM

## 2013-03-30 DIAGNOSIS — L408 Other psoriasis: Secondary | ICD-10-CM | POA: Insufficient documentation

## 2013-03-30 DIAGNOSIS — I2699 Other pulmonary embolism without acute cor pulmonale: Secondary | ICD-10-CM

## 2013-03-30 DIAGNOSIS — D509 Iron deficiency anemia, unspecified: Secondary | ICD-10-CM

## 2013-03-30 DIAGNOSIS — G8929 Other chronic pain: Secondary | ICD-10-CM

## 2013-03-30 DIAGNOSIS — Z86711 Personal history of pulmonary embolism: Secondary | ICD-10-CM | POA: Diagnosis present

## 2013-03-30 DIAGNOSIS — F3289 Other specified depressive episodes: Secondary | ICD-10-CM

## 2013-03-30 DIAGNOSIS — I83813 Varicose veins of bilateral lower extremities with pain: Secondary | ICD-10-CM

## 2013-03-30 HISTORY — DX: Shortness of breath: R06.02

## 2013-03-30 HISTORY — DX: Headache: R51

## 2013-03-30 HISTORY — DX: Unspecified chronic bronchitis: J42

## 2013-03-30 HISTORY — DX: Other complications of anesthesia, initial encounter: T88.59XA

## 2013-03-30 HISTORY — DX: Chest pain, unspecified: R07.9

## 2013-03-30 HISTORY — DX: Pneumonia, unspecified organism: J18.9

## 2013-03-30 HISTORY — DX: Cardiac murmur, unspecified: R01.1

## 2013-03-30 HISTORY — DX: Asymptomatic varicose veins of bilateral lower extremities: I83.93

## 2013-03-30 HISTORY — DX: Adverse effect of unspecified anesthetic, initial encounter: T41.45XA

## 2013-03-30 HISTORY — DX: Headache, unspecified: R51.9

## 2013-03-30 LAB — BASIC METABOLIC PANEL
BUN: 21 mg/dL (ref 6–23)
Chloride: 101 mEq/L (ref 96–112)
Creatinine, Ser: 0.86 mg/dL (ref 0.50–1.10)
GFR calc Af Amer: 78 mL/min — ABNORMAL LOW (ref >90)
GFR calc non Af Amer: 67 mL/min — ABNORMAL LOW (ref >90)

## 2013-03-30 LAB — COMPREHENSIVE METABOLIC PANEL
Albumin: 3.1 g/dL — ABNORMAL LOW (ref 3.5–5.2)
Alkaline Phosphatase: 101 U/L (ref 39–117)
BUN: 17 mg/dL (ref 6–23)
Creatinine, Ser: 0.84 mg/dL (ref 0.50–1.10)
Potassium: 3.6 mEq/L (ref 3.5–5.1)
Total Protein: 6.9 g/dL (ref 6.0–8.3)

## 2013-03-30 LAB — CBC WITH DIFFERENTIAL/PLATELET
Basophils Relative: 0 % (ref 0–1)
Eosinophils Absolute: 0.3 10*3/uL (ref 0.0–0.7)
HCT: 34.1 % — ABNORMAL LOW (ref 36.0–46.0)
Hemoglobin: 11.6 g/dL — ABNORMAL LOW (ref 12.0–15.0)
MCH: 32 pg (ref 26.0–34.0)
MCHC: 34 g/dL (ref 30.0–36.0)
Monocytes Absolute: 0.4 10*3/uL (ref 0.1–1.0)
Monocytes Relative: 6 % (ref 3–12)

## 2013-03-30 LAB — MAGNESIUM: Magnesium: 1.8 mg/dL (ref 1.5–2.5)

## 2013-03-30 LAB — PHOSPHORUS: Phosphorus: 2.4 mg/dL (ref 2.3–4.6)

## 2013-03-30 LAB — SEDIMENTATION RATE: Sed Rate: 43 mm/hr — ABNORMAL HIGH (ref 0–22)

## 2013-03-30 MED ORDER — WARFARIN SODIUM 5 MG PO TABS
5.0000 mg | ORAL_TABLET | Freq: Every day | ORAL | Status: DC
  Administered 2013-03-31 – 2013-04-01 (×2): 5 mg via ORAL
  Filled 2013-03-30 (×4): qty 1

## 2013-03-30 MED ORDER — ALBUTEROL SULFATE HFA 108 (90 BASE) MCG/ACT IN AERS
2.0000 | INHALATION_SPRAY | Freq: Two times a day (BID) | RESPIRATORY_TRACT | Status: DC | PRN
  Filled 2013-03-30: qty 6.7

## 2013-03-30 MED ORDER — TACROLIMUS 0.1 % EX OINT: 1.0000 "application " | TOPICAL_OINTMENT | Freq: Two times a day (BID) | CUTANEOUS | Status: DC

## 2013-03-30 MED ORDER — LORAZEPAM 1 MG PO TABS
1.0000 mg | ORAL_TABLET | Freq: Four times a day (QID) | ORAL | Status: DC | PRN
  Administered 2013-04-02: 1 mg via ORAL
  Filled 2013-03-30: qty 1

## 2013-03-30 MED ORDER — GABAPENTIN 100 MG PO CAPS
200.0000 mg | ORAL_CAPSULE | Freq: Three times a day (TID) | ORAL | Status: DC
  Administered 2013-03-31 – 2013-04-02 (×8): 200 mg via ORAL
  Filled 2013-03-30 (×10): qty 2

## 2013-03-30 MED ORDER — VANCOMYCIN HCL IN DEXTROSE 1-5 GM/200ML-% IV SOLN
1000.0000 mg | Freq: Once | INTRAVENOUS | Status: AC
  Administered 2013-03-30: 1000 mg via INTRAVENOUS
  Filled 2013-03-30: qty 200

## 2013-03-30 MED ORDER — HYDROCODONE-ACETAMINOPHEN 5-325 MG PO TABS
1.0000 | ORAL_TABLET | Freq: Three times a day (TID) | ORAL | Status: DC | PRN
  Administered 2013-03-31 – 2013-04-02 (×4): 1 via ORAL
  Filled 2013-03-30 (×5): qty 1

## 2013-03-30 MED ORDER — SODIUM CHLORIDE 0.9 % IV SOLN
INTRAVENOUS | Status: DC
  Administered 2013-03-30: 13:00:00 via INTRAVENOUS
  Administered 2013-04-01: 10 mL/h via INTRAVENOUS

## 2013-03-30 MED ORDER — LORAZEPAM 2 MG/ML IJ SOLN: 0.0000 mg | Freq: Two times a day (BID) | INTRAMUSCULAR | Status: DC

## 2013-03-30 MED ORDER — DIPHENHYDRAMINE HCL 25 MG PO CAPS
25.0000 mg | ORAL_CAPSULE | Freq: Once | ORAL | Status: AC
  Administered 2013-03-30: 25 mg via ORAL
  Filled 2013-03-30: qty 1

## 2013-03-30 MED ORDER — VANCOMYCIN HCL IN DEXTROSE 1-5 GM/200ML-% IV SOLN
1000.0000 mg | Freq: Two times a day (BID) | INTRAVENOUS | Status: DC
  Administered 2013-03-31 – 2013-04-02 (×4): 1000 mg via INTRAVENOUS
  Filled 2013-03-30 (×6): qty 200

## 2013-03-30 MED ORDER — TACROLIMUS 0.1 % EX OINT
1.0000 "application " | TOPICAL_OINTMENT | Freq: Two times a day (BID) | CUTANEOUS | Status: DC
  Filled 2013-03-30 (×17): qty 30

## 2013-03-30 MED ORDER — HALOPERIDOL LACTATE 5 MG/ML IJ SOLN
1.0000 mg | Freq: Four times a day (QID) | INTRAMUSCULAR | Status: DC | PRN
  Filled 2013-03-30: qty 0.2

## 2013-03-30 MED ORDER — LISINOPRIL 5 MG PO TABS
5.0000 mg | ORAL_TABLET | Freq: Every day | ORAL | Status: DC
  Administered 2013-03-31: 5 mg via ORAL
  Filled 2013-03-30 (×2): qty 1

## 2013-03-30 MED ORDER — LORAZEPAM 2 MG/ML IJ SOLN
1.0000 mg | Freq: Four times a day (QID) | INTRAMUSCULAR | Status: DC | PRN
  Administered 2013-03-31: 1 mg via INTRAVENOUS
  Filled 2013-03-30: qty 1

## 2013-03-30 MED ORDER — FLUTICASONE PROPIONATE HFA 220 MCG/ACT IN AERO
1.0000 | INHALATION_SPRAY | Freq: Two times a day (BID) | RESPIRATORY_TRACT | Status: DC | PRN
Start: 2013-03-30 — End: 2013-04-02
  Filled 2013-03-30: qty 12

## 2013-03-30 MED ORDER — OXYCODONE-ACETAMINOPHEN 5-325 MG PO TABS
2.0000 | ORAL_TABLET | Freq: Once | ORAL | Status: AC
  Administered 2013-03-30: 2 via ORAL
  Filled 2013-03-30: qty 2

## 2013-03-30 MED ORDER — TUBERCULIN PPD 5 UNIT/0.1ML ID SOLN
5.0000 [IU] | Freq: Once | INTRADERMAL | Status: DC
  Filled 2013-03-30: qty 0.1

## 2013-03-30 MED ORDER — BETAMETHASONE VALERATE 0.1 % EX LOTN
TOPICAL_LOTION | Freq: Two times a day (BID) | CUTANEOUS | Status: DC
  Filled 2013-03-30: qty 60

## 2013-03-30 MED ORDER — HYDROCHLOROTHIAZIDE 25 MG PO TABS
25.0000 mg | ORAL_TABLET | Freq: Every day | ORAL | Status: DC
  Administered 2013-03-31 – 2013-04-02 (×4): 25 mg via ORAL
  Filled 2013-03-30 (×4): qty 1

## 2013-03-30 MED ORDER — MECLIZINE HCL 25 MG PO TABS
25.0000 mg | ORAL_TABLET | Freq: Three times a day (TID) | ORAL | Status: DC | PRN
  Filled 2013-03-30: qty 1

## 2013-03-30 MED ORDER — ATENOLOL 100 MG PO TABS
100.0000 mg | ORAL_TABLET | Freq: Every day | ORAL | Status: DC
  Administered 2013-03-31 – 2013-04-02 (×4): 100 mg via ORAL
  Filled 2013-03-30 (×4): qty 1

## 2013-03-30 MED ORDER — PANTOPRAZOLE SODIUM 40 MG PO TBEC
40.0000 mg | DELAYED_RELEASE_TABLET | Freq: Every day | ORAL | Status: DC
  Administered 2013-03-31 – 2013-04-02 (×3): 40 mg via ORAL
  Filled 2013-03-30 (×3): qty 1

## 2013-03-30 MED ORDER — FLUOXETINE HCL 20 MG PO CAPS
60.0000 mg | ORAL_CAPSULE | Freq: Every day | ORAL | Status: DC
  Administered 2013-03-31 – 2013-04-02 (×4): 60 mg via ORAL
  Filled 2013-03-30 (×4): qty 3

## 2013-03-30 MED ORDER — VANCOMYCIN HCL IN DEXTROSE 1-5 GM/200ML-% IV SOLN
1000.0000 mg | Freq: Once | INTRAVENOUS | Status: DC
  Filled 2013-03-30: qty 200

## 2013-03-30 MED ORDER — WARFARIN - PHYSICIAN DOSING INPATIENT
Freq: Every day | Status: DC
  Administered 2013-03-31: 18:00:00

## 2013-03-30 MED ORDER — LORAZEPAM 2 MG/ML IJ SOLN: 0.0000 mg | Freq: Four times a day (QID) | INTRAMUSCULAR | Status: DC

## 2013-03-30 MED ORDER — CAMPHOR-MENTHOL 0.5-0.5 % EX LOTN
1.0000 "application " | TOPICAL_LOTION | Freq: Two times a day (BID) | CUTANEOUS | Status: DC
  Administered 2013-03-30 – 2013-04-02 (×6): 1 via TOPICAL
  Filled 2013-03-30: qty 222

## 2013-03-30 MED ORDER — TRIAMCINOLONE ACETONIDE 0.1 % EX CREA
TOPICAL_CREAM | Freq: Two times a day (BID) | CUTANEOUS | Status: DC
  Administered 2013-03-31 – 2013-04-02 (×5): via TOPICAL
  Filled 2013-03-30 (×2): qty 15

## 2013-03-30 NOTE — Progress Notes (Signed)
ANTIBIOTIC CONSULT NOTE - INITIAL  Pharmacy Consult for Vancomycin Indication: Cellulitis  No Known Allergies  Patient Measurements: Height: 5\' 6"  (167.6 cm) Weight: 249 lb 1.9 oz (113 kg) IBW/kg (Calculated) : 59.3 Adjusted Body Weight: n/a  Vital Signs: Temp: 98 F (36.7 C) (07/07 1536) Temp src: Oral (07/07 1536) BP: 173/78 mmHg (07/07 1536) Pulse Rate: 63 (07/07 1536) Intake/Output from previous day:   Intake/Output from this shift:    Labs:  Recent Labs  03/30/13 1227  WBC 6.3  HGB 11.6*  PLT 234  CREATININE 0.86   Estimated Creatinine Clearance: 77.6 ml/min (by C-G formula based on Cr of 0.86). No results found for this basename: VANCOTROUGH, VANCOPEAK, VANCORANDOM, GENTTROUGH, GENTPEAK, GENTRANDOM, TOBRATROUGH, TOBRAPEAK, TOBRARND, AMIKACINPEAK, AMIKACINTROU, AMIKACIN,  in the last 72 hours   Microbiology: No results found for this or any previous visit (from the past 720 hour(s)).  Medical History: Past Medical History  Diagnosis Date  . Depression   . Schizophrenia   . Arthritis   . Hyperlipidemia   . Hypertension   . Chronic pain   . 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 DVT (deep venous thrombosis)     per 01/17/10 d/c summary- "remote hx of dvt"?  . Iron deficiency anemia   . Chronic headaches   . Sleep apnea   . Glaucoma   . Hemorrhoids   . Asthma   . Anxiety   . GERD (gastroesophageal reflux disease)   . Psoriasis   . Psoriasis 04/29/2012    New onset evaluated by Dr Marylou Flesher, Dermatology, [facility] 6/13  Rx 0.1% Tacrolimus ointment    Medications:  Prescriptions prior to admission  Medication Sig Dispense Refill  . albuterol (VENTOLIN HFA) 108 (90 BASE) MCG/ACT inhaler Inhale 2 puffs into the lungs 2 (two) times daily as needed for wheezing or shortness of breath.       Marland Kitchen atenolol (TENORMIN) 100 MG tablet Take 100 mg by mouth daily.       . camphor-menthol (SARNA) lotion Apply 1 application topically 2  (two) times daily.      Marland Kitchen esomeprazole (NEXIUM) 40 MG capsule Take 40 mg by mouth daily before breakfast.       . FLUoxetine (PROZAC) 20 MG capsule Take 60 mg by mouth daily.       . fluticasone (FLOVENT HFA) 220 MCG/ACT inhaler Inhale 1 puff into the lungs 2 (two) times daily as needed (wheezing and shortness of breath).       . gabapentin (NEURONTIN) 100 MG capsule Take 200 mg by mouth 3 (three) times daily.      . hydrochlorothiazide (HYDRODIURIL) 25 MG tablet Take 1 tablet (25 mg total) by mouth daily.  30 tablet  0  . HYDROcodone-acetaminophen (NORCO/VICODIN) 5-325 MG per tablet Take 1 tablet by mouth every 8 (eight) hours as needed for pain.      Marland Kitchen lisinopril (PRINIVIL,ZESTRIL) 5 MG tablet Take 5 mg by mouth daily.      . meclizine (ANTIVERT) 25 MG tablet Take 25 mg by mouth 3 (three) times daily as needed for nausea.      Marland Kitchen PRESCRIPTION MEDICATION Apply 1 application topically 2 (two) times daily. Cream compounded at [facility]  triamcinolone 0.1% 3 parts, silver sulfadiazine 1% 1 part Applies to entire body after a bath      . tacrolimus (PROTOPIC) 0.1 % ointment Apply 1 application topically 2 (two) times daily as needed (Psoriasis).       Marland Kitchen  warfarin (COUMADIN) 5 MG tablet Take 5 mg by mouth daily.        Assessment: 72 yo female admitted with worsening bilateral lower extremity erythema and edema x 1 week.  Pharmacy asked to begin empiric vancomycin.  She received 1g vancomycin in ED at 1422 PM.  Est. CrCl ~ 75 ml/min.  Goal of Therapy:  Vancomycin trough level 10-15 mcg/ml  Plan:  1. Give additional vancomycin 1g IV x 1 now to complete loading dose. 2. Then give vancomycin 1g IV q 12 hrs. 3. Monitor renal function. 4. Will check vancomycin trough at steady state as needed. 5. Resume home Coumadin soon?  Tad Moore, BCPS  Clinical Pharmacist Pager [phone]  03/30/2013 6:58 PM

## 2013-03-30 NOTE — Progress Notes (Signed)
Pt is refusing to allow rn to start  A new IV on her so that she can go get her ct angio done. Pt is also now stating that she's going to take home medications. Pt refuses to allow the rn to take her home medications out of her purse. rn paged np on call who is to come see the pt at the bedside.

## 2013-03-30 NOTE — Progress Notes (Signed)
Spoke with spouse who stated "he doesn't know what she's talking about. He's not understanding what she's talking about. He said the pt has been acting funny since this morning. He doesn't know why she's acting like this." He requested that I call her sister to speak with her [phone] Britta Mccreedy)

## 2013-03-30 NOTE — H&P (Signed)
[facility] History and Physical  BRADEE COMMON ZOX:[id number] DOB: 11/29/1940 DOA: 03/30/2013  Referring physician:  PCP: Janell Quiet, FNP  Specialists:   Chief Complaint: Increasing lower extremity pain, DOE  HPI: [name] [name] is a 72 y.o. BF,  past medical history of HTN, PE (on chronic Coumadin), HLD, schizophrenia, depression, asthma, lung nodule, drug abuse, newly diagnosed psoriasis by biopsy(January 2014 biopsy of left lower leg at [facility]). Patient was started on camphor-menthol plus Protopic oint + Triamcinalone cream. Patient states the patient has been used as directed but has not resolved problem. States 2 weeks ago left lower leg began to swell become erythematous, become painful described as burning. The symptom then began to to invade the right lower leg. Also complains of right lower calf pain, with increasing DOE + CP relieved with rest which started in January 2014. Patient currently therapeutic on Coumadin last INR 2.76 on 03/30/2013. Cardiac enzymes x3 negative   Review of Systems: The patient denies anorexia, fever, weight loss,, vision loss, decreased hearing, hoarseness, syncope, balance deficits, hemoptysis, abdominal pain, melena, hematochezia, severe indigestion/heartburn, hematuria, incontinence, genital sores, muscle weakness, transient blindness, difficulty walking, depression, unusual weight change, abnormal bleeding, enlarged lymph nodes, angioedema, and breast masses.    Past Medical History  Diagnosis Date  . Depression   . Schizophrenia   . Arthritis   . Hyperlipidemia   . Hypertension   . Chronic pain   . 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 DVT (deep venous thrombosis)     per 01/17/10 d/c summary- "remote hx of dvt"?  . Iron deficiency anemia   . Chronic headaches   . Sleep apnea   . Glaucoma   . Hemorrhoids   . Asthma   . Anxiety   . GERD (gastroesophageal reflux disease)   . Psoriasis   .  Psoriasis 04/29/2012    New onset evaluated by Dr Marylou Flesher, Dermatology, [facility] 6/13  Rx 0.1% Tacrolimus ointment   Past Surgical History  Procedure Laterality Date  . Spinal fusion      2004  . Joint replacement  right knee 11/2008  . Tubal ligation  year 65  . Carpal tunnel release      bilateral    Social History:  never smoked, negative alcohol, positive illicit drugs, married, 3 children   No Known Allergies  Family History  Problem Relation Age of Onset  . Diabetes Sister   . Colon cancer Brother 40    sister HTN/DM, 2x brother colon cancer   Prior to Admission medications   Medication Sig Start Date End Date Taking? Authorizing Provider  albuterol (VENTOLIN HFA) 108 (90 BASE) MCG/ACT inhaler Inhale 2 puffs into the lungs 2 (two) times daily as needed for wheezing or shortness of breath.    Yes Historical Provider, MD  atenolol (TENORMIN) 100 MG tablet Take 100 mg by mouth daily.    Yes Historical Provider, MD  camphor-menthol Wynelle Fanny) lotion Apply 1 application topically 2 (two) times daily.   Yes Historical Provider, MD  esomeprazole (NEXIUM) 40 MG capsule Take 40 mg by mouth daily before breakfast.  04/07/12  Yes Adlih Moreno-Coll, MD  FLUoxetine (PROZAC) 20 MG capsule Take 60 mg by mouth daily.    Yes Historical Provider, MD  fluticasone (FLOVENT HFA) 220 MCG/ACT inhaler Inhale 1 puff into the lungs 2 (two) times daily as needed (wheezing and shortness of breath).    Yes Historical Provider, MD  gabapentin (NEURONTIN) 100 MG capsule Take 200 mg by mouth 3 (three) times daily.   Yes Historical Provider, MD  hydrochlorothiazide (HYDRODIURIL) 25 MG tablet Take 1 tablet (25 mg total) by mouth daily. 12/28/12  Yes Lyanne Co, MD  HYDROcodone-acetaminophen (NORCO/VICODIN) 5-325 MG per tablet Take 1 tablet by mouth every 8 (eight) hours as needed for pain.   Yes Historical Provider, MD  lisinopril (PRINIVIL,ZESTRIL) 5 MG tablet Take 5 mg by mouth daily.   Yes Historical  Provider, MD  meclizine (ANTIVERT) 25 MG tablet Take 25 mg by mouth 3 (three) times daily as needed for nausea.   Yes Historical Provider, MD  PRESCRIPTION MEDICATION Apply 1 application topically 2 (two) times daily. Cream compounded at [facility]  triamcinolone 0.1% 3 parts, silver sulfadiazine 1% 1 part Applies to entire body after a bath   Yes Historical Provider, MD  tacrolimus (PROTOPIC) 0.1 % ointment Apply 1 application topically 2 (two) times daily as needed (Psoriasis).  04/12/12 04/12/13 Yes Remi Haggard, NP  warfarin (COUMADIN) 5 MG tablet Take 5 mg by mouth daily.    Yes Historical Provider, MD   Physical Exam: Filed Vitals:   03/30/13 1315 03/30/13 1330 03/30/13 1345 03/30/13 1446  BP: 133/67 128/69 126/69 147/74  Pulse: 64 64 65 93  Temp:      Resp:      SpO2: 97% 98% 97%      General:  alert, NAD  Cardiovascular:  regular with him and rate, negative murmurs rubs or gallops, DP/PT pulse 2+   Respiratory:  clear to auscultation bilateral   Abdomen:obese, soft, plus bowel sounds   Skin:  left lower extremity is swollen, plus rubor erythematous from the knee to the ankle non-suparating lesions, painful to; right lower extremity becoming erythematous, negative rubor, calf muscle tenderness to palpation (possible cording)  Musculoskeletal:  the skin   Labs on Admission:  Basic Metabolic Panel:  Recent Labs Lab 03/30/13 1227  NA 138  K 4.1  CL 101  CO2 30  GLUCOSE 117*  BUN 21  CREATININE 0.86  CALCIUM 9.1   Liver Function Tests: No results found for this basename: AST, ALT, ALKPHOS, BILITOT, PROT, ALBUMIN,  in the last 168 hours No results found for this basename: LIPASE, AMYLASE,  in the last 168 hours No results found for this basename: AMMONIA,  in the last 168 hours CBC:  Recent Labs Lab 03/30/13 1227  WBC 6.3  NEUTROABS 4.3  HGB 11.6*  HCT 34.1*  MCV 93.9  PLT 234   Cardiac Enzymes: No results found for this basename: CKTOTAL, CKMB, CKMBINDEX,  TROPONINI,  in the last 168 hours  BNP (last 3 results) No results found for this basename: PROBNP,  in the last 8760 hours CBG: No results found for this basename: GLUCAP,  in the last 168 hours  Radiological Exams on Admission: No results found.  EKG: Pending   Assessment/Plan Active Problems:   * No active hospital problems. *   1.  cellulitis bilateral lower extremity; patient started on vancomycin managed by the pharmacy secondary to history of drug abuse will start clindamycin in the a.m. 2. Psoriasis, R/O MCTD; we'll continue to treat patient's psoriasis with her topical medication however we'll place PPD and if negative will start her on oral medication(methotrexate). We'll also rule out any underlying MCTD 3. PE; will obtain chest CT PE protocol to determine if propagation of PE. If this addition we will have to consider placing a filter.  4. DVT;  patient is at high risk for DVT, will obtain lower term in the Dopplers  5. Substance abuse; obtain drug abuse panel 6. HTN; still not within [facility] guideline increase lisinopril to 10 mg daily 7. DOE obtain serial cardiac enzymes, will consider 2-D echo    Code Status:  Full   Time spent: 90 minutes   Timiyah Romito, Roselind Messier [facility] Pager [phone]  If 7PM-7AM, please contact night-coverage [email or web address] Password [facility] 03/30/2013, 2:48 PM

## 2013-03-30 NOTE — Progress Notes (Signed)
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 "The whole family eats it".

## 2013-03-30 NOTE — ED Provider Notes (Signed)
History    CSN: [id number] Arrival date & time 03/30/13  1140  First MD Initiated Contact with Patient 03/30/13 1219     Chief Complaint  Patient presents with  . Leg Swelling   (Consider location/radiation/quality/duration/timing/severity/associated sxs/prior Treatment) The history is provided by the patient.   patient here complaining of bilateral lower extremity erythema and edema x1 week has been progressively worse. Denies any fever or chills. No vomiting or diarrhea. She notes increased left leg swelling without associated pleuritic chest pain. Symptoms have been persistently worse and nothing makes them better or worse. No treatment used prior to arrival Past Medical History  Diagnosis Date  . Depression   . Schizophrenia   . Arthritis   . Hyperlipidemia   . Hypertension   . Chronic pain   . 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 DVT (deep venous thrombosis)     per 01/17/10 d/c summary- "remote hx of dvt"?  . Iron deficiency anemia   . Chronic headaches   . Sleep apnea   . Glaucoma   . Hemorrhoids   . Asthma   . Anxiety   . GERD (gastroesophageal reflux disease)   . Psoriasis   . Psoriasis 04/29/2012    New onset evaluated by Dr Marylou Flesher, Dermatology, [facility] 6/13  Rx 0.1% Tacrolimus ointment   Past Surgical History  Procedure Laterality Date  . Spinal fusion      2004  . Joint replacement  right knee 11/2008  . Tubal ligation  year 58  . Carpal tunnel release      bilateral    Family History  Problem Relation Age of Onset  . Diabetes Sister   . Colon cancer Brother 40   History  Substance Use Topics  . Smoking status: Never Smoker   . Smokeless tobacco: Never Used  . Alcohol Use: No   OB History   Grav Para Term Preterm Abortions TAB SAB Ect Mult Living                 Review of Systems  All other systems reviewed and are negative.    Allergies  Review of patient's allergies indicates no known  allergies.  Home Medications   Current Outpatient Rx  Name  Route  Sig  Dispense  Refill  . albuterol (VENTOLIN HFA) 108 (90 BASE) MCG/ACT inhaler   Inhalation   Inhale 2 puffs into the lungs 2 (two) times daily as needed for wheezing or shortness of breath.          Marland Kitchen antipyrine-benzocaine (AURALGAN) otic solution   Both Ears   Place 2 drops into both ears daily as needed for pain.         Marland Kitchen atenolol (TENORMIN) 100 MG tablet   Oral   Take 100 mg by mouth daily.          . camphor-menthol (SARNA) lotion   Topical   Apply 1 application topically 2 (two) times daily.         Marland Kitchen esomeprazole (NEXIUM) 40 MG capsule   Oral   Take 40 mg by mouth daily before breakfast.          . FLUoxetine (PROZAC) 20 MG capsule   Oral   Take 60 mg by mouth daily.          . fluticasone (FLOVENT HFA) 220 MCG/ACT inhaler   Inhalation   Inhale 1 puff into the lungs 2 (two) times daily  as needed (wheezing and shortness of breath).          . gabapentin (NEURONTIN) 300 MG capsule   Oral   Take 300 mg by mouth at bedtime.         . hydrochlorothiazide (HYDRODIURIL) 25 MG tablet   Oral   Take 1 tablet (25 mg total) by mouth daily.   30 tablet   0   . hydrOXYzine (ATARAX/VISTARIL) 10 MG tablet   Oral   Take 10 mg by mouth at bedtime as needed for itching. For psoriasis itching         . lisinopril (PRINIVIL,ZESTRIL) 5 MG tablet   Oral   Take 5 mg by mouth daily.         . meclizine (ANTIVERT) 25 MG tablet   Oral   Take 25 mg by mouth 3 (three) times daily as needed for nausea.         . predniSONE (DELTASONE) 10 MG tablet   Oral   Take 10 mg by mouth daily. Take 4 tabs for 2 days, take 3 tabs for 2 days, take 2 for 2 days and take 1 tab for 2 days         . predniSONE (DELTASONE) 20 MG tablet   Oral   Take 20 mg by mouth daily.         Marland Kitchen PRESCRIPTION MEDICATION   Topical   Apply 1 application topically 2 (two) times daily. Cream compounded at [facility]   triamcinolone 0.1% 3 parts, silver sulfadiazine 1% 1 part Applies to entire body after a bath         . tacrolimus (PROTOPIC) 0.1 % ointment   Topical   Apply 1 application topically 2 (two) times daily as needed (Psoriasis).          . warfarin (COUMADIN) 5 MG tablet   Oral   Take 5 mg by mouth daily.           BP 147/79  Pulse 96  Temp(Src) 99 F (37.2 C)  Resp 18  SpO2 96%  LMP 11/22/1996 Physical Exam  Nursing note and vitals reviewed. Constitutional: She is oriented to person, place, and time. She appears well-developed and well-nourished.  Non-toxic appearance. No distress.  HENT:  Head: Normocephalic and atraumatic.  Eyes: Conjunctivae, EOM and lids are normal. Pupils are equal, round, and reactive to light.  Neck: Normal range of motion. Neck supple. No tracheal deviation present. No mass present.  Cardiovascular: Normal rate, regular rhythm and normal heart sounds.  Exam reveals no gallop.   No murmur heard. Pulmonary/Chest: Effort normal and breath sounds normal. No stridor. No respiratory distress. She has no decreased breath sounds. She has no wheezes. She has no rhonchi. She has no rales.  Abdominal: Soft. Normal appearance and bowel sounds are normal. She exhibits no distension. There is no tenderness. There is no rebound and no CVA tenderness.  Musculoskeletal: Normal range of motion. She exhibits no edema and no tenderness.  Bilateral lower extremity edema worse in the left side with associated erythema and warmness to the touch. No crepitus noted. Neurovascularly intact distally on both lower extremities  Neurological: She is alert and oriented to person, place, and time. She has normal strength. No cranial nerve deficit or sensory deficit. GCS eye subscore is 4. GCS verbal subscore is 5. GCS motor subscore is 6.  Skin: Skin is warm and dry. No abrasion and no rash noted.  Psychiatric: She has a normal mood and  affect. Her speech is normal and behavior is  normal.    ED Course  Procedures (including critical care time) Labs Reviewed  CBC WITH DIFFERENTIAL  BASIC METABOLIC PANEL  APTT  PROTIME-INR   No results found. No diagnosis found.  MDM  Patient started on vancomycin and will be admitted for cellulitis  [name], MD 03/30/13 1428

## 2013-03-30 NOTE — ED Notes (Signed)
Pt is here with complaints of swollen legs with redness.

## 2013-03-30 NOTE — Progress Notes (Signed)
[name] [name] [id number] Code Status: full   Admission Data: 03/30/2013 3:32 PM Attending Provider:  Joseph Art GEX:[id number], Sherran Needs, FNP Consults/ Treatment Team:    [name] [name] is a 72 y.o. female patient admitted from ED awake, alert - oriented  X 3 - no acute distress noted.  VSS - Blood pressure 147/74, pulse 93, temperature 98 F (36.7 C), temperature source Oral, resp. rate 18, last menstrual period 11/22/1996, SpO2 97.00%.  no c/o shortness of breath, no c/o chest pain.   IV Fluids:  IV in place, occlusive dsg intact without redness, IV cath hand left, condition patent and no redness normal saline.  Allergies:  No Known Allergies   Past Medical History  Diagnosis Date  . Depression   . Schizophrenia   . Arthritis   . Hyperlipidemia   . Hypertension   . Chronic pain   . 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 DVT (deep venous thrombosis)     per 01/17/10 d/c summary- "remote hx of dvt"?  . Iron deficiency anemia   . Chronic headaches   . Sleep apnea   . Glaucoma   . Hemorrhoids   . Asthma   . Anxiety   . GERD (gastroesophageal reflux disease)   . Psoriasis   . Psoriasis 04/29/2012    New onset evaluated by Dr Marylou Flesher, Dermatology, [facility] 6/13  Rx 0.1% Tacrolimus ointment   Medications Prior to Admission  Medication Sig Dispense Refill  . albuterol (VENTOLIN HFA) 108 (90 BASE) MCG/ACT inhaler Inhale 2 puffs into the lungs 2 (two) times daily as needed for wheezing or shortness of breath.       Marland Kitchen atenolol (TENORMIN) 100 MG tablet Take 100 mg by mouth daily.       . camphor-menthol (SARNA) lotion Apply 1 application topically 2 (two) times daily.      Marland Kitchen esomeprazole (NEXIUM) 40 MG capsule Take 40 mg by mouth daily before breakfast.       . FLUoxetine (PROZAC) 20 MG capsule Take 60 mg by mouth daily.       . fluticasone (FLOVENT HFA) 220 MCG/ACT inhaler Inhale 1 puff into the lungs 2 (two) times daily as needed (wheezing and shortness  of breath).       . gabapentin (NEURONTIN) 100 MG capsule Take 200 mg by mouth 3 (three) times daily.      . hydrochlorothiazide (HYDRODIURIL) 25 MG tablet Take 1 tablet (25 mg total) by mouth daily.  30 tablet  0  . HYDROcodone-acetaminophen (NORCO/VICODIN) 5-325 MG per tablet Take 1 tablet by mouth every 8 (eight) hours as needed for pain.      Marland Kitchen lisinopril (PRINIVIL,ZESTRIL) 5 MG tablet Take 5 mg by mouth daily.      . meclizine (ANTIVERT) 25 MG tablet Take 25 mg by mouth 3 (three) times daily as needed for nausea.      Marland Kitchen PRESCRIPTION MEDICATION Apply 1 application topically 2 (two) times daily. Cream compounded at [facility]  triamcinolone 0.1% 3 parts, silver sulfadiazine 1% 1 part Applies to entire body after a bath      . tacrolimus (PROTOPIC) 0.1 % ointment Apply 1 application topically 2 (two) times daily as needed (Psoriasis).       . warfarin (COUMADIN) 5 MG tablet Take 5 mg by mouth daily.        History:  obtained from the patient. Tobacco/alcohol: denied none  Orientation to room, and floor completed with information  packet given to patient/family.  Patient declined safety video at this time.  Admission INP armband ID verified with patient/family, and in place.   SR up x 2, fall assessment complete, with patient and family able to verbalize understanding of risk associated with falls, and verbalized understanding to call nsg before up out of bed.  Call light within reach, patient able to voice, and demonstrate understanding.  Skin, clean-dry- intact without evidence of bruising, or skin tears.   No evidence of skin break down noted on exam.     Will cont to eval and treat per MD orders.  [name] [name], MARQUART, RN 03/30/2013 3:32 PM

## 2013-03-30 NOTE — Progress Notes (Signed)
Pt is refusing to let the nurse mark her legs or give her any of her medications. Pt is requesting to transfer to [facility]. rn to page np on call and await further orders. RN explained to the patient the importance of taking her medication and why she's getting her medications.

## 2013-03-30 NOTE — ED Notes (Signed)
Report given to Sherwood, Electrical engineer. Pt being prepared for transport to floor. Nurse has no further questions upon report given.

## 2013-03-30 NOTE — ED Notes (Signed)
Dr Allen at bedside  

## 2013-03-30 NOTE — Progress Notes (Signed)
Shift event: RN paged this NP because pt was threatening to leave AMA and wanted to "be transferred to [facility]. Pt also uncooperative for IV start for CTA chest (to r/o PE), for meds, for urine sample, and refuses to give RN her home meds that are in her purse. NP to bedside. S: Pt says she is upset because her husband is talking to the nurses behind her back, only wants "her money" and is lying to her. She says her daughter is on the husband's side and "nobody cares" that she is in the hospital. Wants to go to [facility] because "they have my records" and the doctors here aren't telling her anything". Only + physical sx is pain in legs. Denies everyday ETOH use at home. Admits to drinking "corn liquor" at times.  O: Well appearing elderly AAF in NAD. When I arrive, she is on the phone with her daughter and hangs up to talk with this NP. She is able to tell me that she is in the hospital, in [place], and that it is July. When asked about the year, she says "he's in the [facility] with his wife and 2 children". When reminded I asked about the year, she replies " it is a new year every year." When asked to spell "WORLD" backwards, she replies, "I ain't, you're just trying to trick me". She maintains eye contact fairly well. Her speech is clear. She gets off track with conversation at times and has to be redirected. Her affect is blunt, she doesn't smile and is matter of fact in her speaking. She is cooperative in conversation and when I ask her to give me her home meds. Upon gathering her home meds, this NP noticed a Rx for Hydrocodone/Acet that was filled on 03/26/13 for #90 pills. This NP and RN counted pills left in bottle and there was only 17. This NP asked pt if she took all those pills since 03/26/13 and she said "yes". I asked her if she took them all at once and she said "no", she "spaced them out". She denied that anyone else took her med and stated "i took them all, because my husband likes me  to be quiet at home".  A/P: 1. Behavior issue ? new vs psych issue vs paranoia vs narcotic/ETOH abuse vs delirium from infection-RN called her husband when she was threatening to leave. See the note by RN. He really didn't have anything to offer except "she's been acting strange since this morning", she needs "to see a psychiatrist". Her daughter asked this NP for update on her mother, but this NP told her I couldn't tell her anything without her mother's (the pt) permission. I spoke for about 20 minutes to pt and stressed the importance of her staying here and cooperating with her care so she could get better. I explained she has an infection in her leg which requires antibiotic treatment. I explained why we need her home meds and why we can't allow her to keep them in her room. I explained that the doctor ordered tests to see if she has a "blood clot" in her lungs and/or in her legs. I ordered a UA to r/o another source of infection. Psych consult in am?  2. Hx PE with chronic AC-to have CTA chest and dopplers of legs when cooperative for larger bore IV. Her INR is therapeutic at 2.6. Coumadin conts.  3. Cellulitis-stressed importance of IV abx.  4. ? Narcotic/ETOH abuse-urine tox ordered but  she hasn't cooperated for UA sample yet. I stressed importance of this as well. Will order prn haldol in case she has severe agitation. Add Tylenol level to testing. Add CIWA protocol just in case. If becomes beligerant, order sitter at bedside.  I discussed this case with Dr. Conley Rolls, [facility]. Will update him if pt is asking to leave again. I do not feel her judgement is sound enough for her to make the decision to leave AMA. May have to IVC if she becomes uncooperative again.  Jimmye Norman, NP [facility]

## 2013-03-31 ENCOUNTER — Inpatient Hospital Stay (HOSPITAL_COMMUNITY): Payer: PRIVATE HEALTH INSURANCE

## 2013-03-31 ENCOUNTER — Encounter (HOSPITAL_COMMUNITY): Payer: Self-pay

## 2013-03-31 DIAGNOSIS — F1410 Cocaine abuse, uncomplicated: Secondary | ICD-10-CM

## 2013-03-31 DIAGNOSIS — J45909 Unspecified asthma, uncomplicated: Secondary | ICD-10-CM

## 2013-03-31 DIAGNOSIS — M79609 Pain in unspecified limb: Secondary | ICD-10-CM

## 2013-03-31 DIAGNOSIS — R0602 Shortness of breath: Secondary | ICD-10-CM

## 2013-03-31 LAB — ACETAMINOPHEN LEVEL: Acetaminophen (Tylenol), Serum: <15 ug/mL (ref 10–30)

## 2013-03-31 LAB — URINE MICROSCOPIC-ADD ON

## 2013-03-31 LAB — URINALYSIS, ROUTINE W REFLEX MICROSCOPIC
Glucose, UA: NEGATIVE mg/dL
Nitrite: NEGATIVE
Specific Gravity, Urine: 1.019 (ref 1.005–1.030)
pH: 7 (ref 5.0–8.0)

## 2013-03-31 LAB — RHEUMATOID FACTOR: Rhuematoid fact SerPl-aCnc: <10 IU/mL (ref <=14)

## 2013-03-31 LAB — ANA: Anti Nuclear Antibody(ANA): NEGATIVE

## 2013-03-31 LAB — PROTIME-INR: INR: 2.18 — ABNORMAL HIGH (ref 0.00–1.49)

## 2013-03-31 LAB — TROPONIN I: Troponin I: <0.3 ng/mL (ref <0.30)

## 2013-03-31 MED ORDER — CLINDAMYCIN HCL 300 MG PO CAPS
300.0000 mg | ORAL_CAPSULE | Freq: Three times a day (TID) | ORAL | Status: DC
  Administered 2013-03-31 – 2013-04-02 (×6): 300 mg via ORAL
  Filled 2013-03-31 (×9): qty 1

## 2013-03-31 MED ORDER — MORPHINE SULFATE 2 MG/ML IJ SOLN
1.0000 mg | INTRAMUSCULAR | Status: AC | PRN
  Administered 2013-03-31 (×2): 1 mg via INTRAVENOUS
  Filled 2013-03-31 (×2): qty 1

## 2013-03-31 MED ORDER — TACROLIMUS 0.1 % EX OINT
TOPICAL_OINTMENT | Freq: Two times a day (BID) | CUTANEOUS | Status: DC
  Administered 2013-04-01 – 2013-04-02 (×3): via TOPICAL
  Filled 2013-03-31: qty 30

## 2013-03-31 MED ORDER — LISINOPRIL 10 MG PO TABS
10.0000 mg | ORAL_TABLET | Freq: Every day | ORAL | Status: DC
  Administered 2013-03-31 – 2013-04-02 (×3): 10 mg via ORAL
  Filled 2013-03-31 (×3): qty 1

## 2013-03-31 MED ORDER — TACROLIMUS 0.1 % EX OINT
TOPICAL_OINTMENT | Freq: Two times a day (BID) | CUTANEOUS | Status: DC
  Filled 2013-03-31 (×16): qty 30

## 2013-03-31 MED ORDER — IOHEXOL 350 MG/ML SOLN
100.0000 mL | Freq: Once | INTRAVENOUS | Status: AC | PRN
  Administered 2013-03-31: 100 mL via INTRAVENOUS

## 2013-03-31 MED ORDER — RANITIDINE HCL 150 MG/10ML PO SYRP
150.0000 mg | ORAL_SOLUTION | Freq: Once | ORAL | Status: AC
  Administered 2013-03-31: 150 mg via ORAL
  Filled 2013-03-31: qty 10

## 2013-03-31 NOTE — Progress Notes (Signed)
Spoke with pt's husband who said "I think she needs a psych eval or to get admitted to behavioral health. That's why no one has come up there. We're worried about her. She needs some help."

## 2013-03-31 NOTE — Progress Notes (Signed)
VASCULAR LAB PRELIMINARY  PRELIMINARY  PRELIMINARY  PRELIMINARY  Bilateral lower extremity venous duplex  completed.    Preliminary report:  Bilateral:  No evidence of DVT, superficial thrombosis, or Baker's Cyst.    Deajah Erkkila, RVT 03/31/2013, 2:57 PM

## 2013-03-31 NOTE — Progress Notes (Signed)
Update: I have received no further calls tonight for behavior issues on pt. KJKG, NP

## 2013-03-31 NOTE — Progress Notes (Signed)
[facility] PROGRESS NOTE  [name] [name] ZOX:[id number] DOB: [birth year] DOA: 03/30/2013 PCP: Janell Quiet, FNP  Assessment/Plan: 1. cellulitis bilateral lower extremity; day 2 p vancomycin. Bilateral legs appear improved. The patient concurs, however given patient's history of drug abuse will start clindamycin 2. Psoriasis, R/O MCTD; we'll continue to treat patient's psoriasis with her topical medication however we'll place PPD and if negative will start her on oral medication(methotrexate). CRP positive, ESR positive, ANA negative, RF negative, remainder of MCTD panel pending  3. PE; will obtain chest CT PE protocol to determine if propagation of PE. If this addition we will have to consider placing a filter.  4. DVT; patient is at high risk for DVT, will obtain lower term in the Dopplers  5. Substance abuse; obtain drug abuse panel 6. HTN; improved on 10 mg of lisinopril now within [facility] guideline  7. DOE obtain serial cardiac enzymes negative, obtain 2-D cardiac echo  Disposition Plan:   Procedures:  Chest CT PE protocol P  Bilat Lower extremity Dopplers P  Antibiotics:  Vancomycin day 2, clindamycin day 1  HPI/Subjective: [name] [name] is a 72 y.o. 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 Patient was started on camphor-menthol plus Protopic oint + Triamcinalone cream. Patient states the patient has been used as directed but has not resolved problem. States 2 weeks ago left lower leg began to swell become erythematous, become painful described as burning. The symptom then began to to invade the right lower leg. Also complains of right lower calf pain, with increasing DOE + CP relieved with rest which started in January 2014. Patient currently therapeutic on Coumadin last INR 2.76 on 03/30/2013.  Cardiac enzymes x3 negative TODAY spoke the patient's RN who informed me that patient had dosed herself with a large amount of narcotics that she brought with her to room in her purse. Patient, cooperative and would not allow a chest CT PE protocol be performed. Patient's RN confiscated medication. At bedside this a.m. I reinforced that patient needs to follow plan of care. Patient stated that she did not remember refusing care and would be compliant.    Objective: Filed Vitals:   03/30/13 1536 03/30/13 2358 03/31/13 0536 03/31/13 0940  BP: 173/78 149/89 151/85 126/75  Pulse: 63 71 55   Temp: 98 F (36.7 C) 97.9 F (36.6 C) 98.5 F (36.9 C)   TempSrc: Oral Oral Oral   Resp: 18 18 18    Height: 5\' 6"  (1.676 m)     Weight: 113 kg (249 lb 1.9 oz)     SpO2: 99% 97% 99%     Intake/Output Summary (Last 24 hours) at 03/31/13 1259 Last data filed at 03/31/13 0546  Gross per 24 hour  Intake      0 ml  Output   1300 ml  Net  -1300 ml   Filed Weights   03/30/13 1536  Weight: 113 kg (249 lb 1.9 oz)    Exam: General: alert, NAD  Cardiovascular: regular with him and rate, negative murmurs rubs or gallops, DP/PT pulse 2+  Respiratory: clear to auscultation bilateral  Abdomen:obese, soft, plus bowel sounds  Skin: left lower extremity is swollen but improved from yesterday, plus rubor erythematous from the knee to the ankle non-suparating lesions (decreased from yesterday), painful to palpation; right lower extremity becoming erythematous (starting to clear), negative  rubor, calf muscle tenderness to palpation (possible cording)  Musculoskeletal: the skin   Data Reviewed: Basic Metabolic Panel:  Recent Labs Lab 03/30/13 1227 03/30/13 2045  NA 138 139  K 4.1 3.6  CL 101 103  CO2 30 29  GLUCOSE 117* 126*  BUN 21 17  CREATININE 0.86 0.84  CALCIUM 9.1 8.7  MG  --  1.8  PHOS  --  2.4   Liver Function Tests:  Recent Labs Lab 03/30/13 2045  AST 17  ALT 14  ALKPHOS 101  BILITOT  0.3  PROT 6.9  ALBUMIN 3.1*   No results found for this basename: LIPASE, AMYLASE,  in the last 168 hours No results found for this basename: AMMONIA,  in the last 168 hours CBC:  Recent Labs Lab 03/30/13 1227  WBC 6.3  NEUTROABS 4.3  HGB 11.6*  HCT 34.1*  MCV 93.9  PLT 234   Cardiac Enzymes:  Recent Labs Lab 03/30/13 1959 03/31/13 0045 03/31/13 0715  TROPONINI <0.30 <0.30 <0.30   BNP (last 3 results) No results found for this basename: PROBNP,  in the last 8760 hours CBG: No results found for this basename: GLUCAP,  in the last 168 hours  No results found for this or any previous visit (from the past 240 hour(s)).   Studies: No results found.  Scheduled Meds: . atenolol  100 mg Oral Daily  . camphor-menthol  1 application Topical BID  . FLUoxetine  60 mg Oral Daily  . gabapentin  200 mg Oral TID  . hydrochlorothiazide  25 mg Oral Daily  . lisinopril  10 mg Oral Daily  . pantoprazole  40 mg Oral Daily  . [START ON 04/01/2013] tacrolimus   Topical BID  . triamcinolone cream   Topical BID  . tuberculin  5 Units Intradermal Once  . vancomycin  1,000 mg Intravenous Q12H  . vancomycin  1,000 mg Intravenous Once  . warfarin  5 mg Oral Daily  . Warfarin - Physician Dosing Inpatient   Does not apply q1800   Continuous Infusions: . sodium chloride 20 mL/hr at 03/30/13 1301    Active Problems:   Essential hypertension, benign   PULMONARY EMBOLISM   Cellulitis of leg, left   Cellulitis of leg, right    Time spent: 40 minutes    Stephenia Vogan, J  [facility] Pager [phone]. If 7PM-7AM, please contact night-coverage at [email or web address], password [facility] 03/31/2013, 12:59 PM  LOS: 1 day

## 2013-03-31 NOTE — Progress Notes (Signed)
PHARMACIST - PHYSICIAN COMMUNICATION  CONCERNING: Pharmacy Care Issues Regarding Warfarin Labs  RECOMMENDATION (Action Taken): A baseline and daily protime for three days has been ordered to meet the [facility] Patient safety goal and comply with the current [facility] Pharmacy & Therapeutics Committee policy.   The Pharmacy will defer all warfarin dose order changes and follow up of lab results to the prescriber unless an additional order to initiate a "pharmacy Coumadin consult" is placed.  DESCRIPTION:  While hospitalized, to be in compliance with The [employer] Commission National Patient Safety Goals, all patients on warfarin must have a baseline and/or current protime prior to the administration of warfarin. Pharmacy has received your order for warfarin without these required laboratory assessments.  Thanks, Marchelle Folks. Illene Bolus, PharmD, BCPS Clinical Pharmacist Pager: [phone] Pharmacy: [phone] 03/31/2013 8:45 AM

## 2013-04-01 DIAGNOSIS — I10 Essential (primary) hypertension: Secondary | ICD-10-CM

## 2013-04-01 DIAGNOSIS — I83893 Varicose veins of bilateral lower extremities with other complications: Secondary | ICD-10-CM

## 2013-04-01 DIAGNOSIS — L0390 Cellulitis, unspecified: Secondary | ICD-10-CM

## 2013-04-01 DIAGNOSIS — I369 Nonrheumatic tricuspid valve disorder, unspecified: Secondary | ICD-10-CM

## 2013-04-01 DIAGNOSIS — D6859 Other primary thrombophilia: Secondary | ICD-10-CM

## 2013-04-01 DIAGNOSIS — Z7901 Long term (current) use of anticoagulants: Secondary | ICD-10-CM

## 2013-04-01 LAB — ANTI-RIBONUCLEIC ACID ANTIBODY: Sm/rnp: <1 AU/mL (ref <30)

## 2013-04-01 LAB — URINE DRUGS OF ABUSE SCREEN W ALC, ROUTINE (REF LAB)
Barbiturate Quant, Ur: NEGATIVE
Benzodiazepines.: NEGATIVE
Methadone: NEGATIVE
Phencyclidine (PCP): NEGATIVE

## 2013-04-01 LAB — ANTI-SMITH ANTIBODY: ENA SM Ab Ser-aCnc: 2 AU/mL (ref <30)

## 2013-04-01 LAB — PROTIME-INR: Prothrombin Time: 24.2 seconds — ABNORMAL HIGH (ref 11.6–15.2)

## 2013-04-01 MED ORDER — ALUM & MAG HYDROXIDE-SIMETH 200-200-20 MG/5ML PO SUSP: 30.0000 mL | Freq: Four times a day (QID) | ORAL | Status: DC | PRN

## 2013-04-01 NOTE — Progress Notes (Signed)
[facility] PROGRESS NOTE  [name] [name] ZOX:[id number] DOB: [birth year] DOA: 03/30/2013 PCP: Janell Quiet, FNP  Assessment/Plan: Cellulitis lower extremities -Continue vancomycin, day #3 -Discontinue clindamycin -Plan to discharge on oral antibiotics Psoriasis -Continue home protopic and triamcinolone Lower extremity edema and pain -Venous Dopplers negative for DVT Hypertension -Continue lisinopril Dyspnea on exertion -Clinically stable -oxygen saturation 99% on room air -Echocardiogram EF 60-65%, grade 1 diastolic dysfunction -Currently no shortness of breath -CT angiogram chest negative for pulmonary embolus History of DVT/PE -Continue warfarin    Family Communication:   Pt at beside Disposition Plan:   Home 04/02/13 if stable        Procedures/Studies: Ct Head Wo Contrast  03/18/2013   *RADIOLOGY REPORT*  Clinical Data: Dizziness  CT HEAD WITHOUT CONTRAST  Technique:  Contiguous axial images were obtained from the base of the skull through the vertex without contrast.  Comparison: 01/21/2013 and multiple previous  Findings: The brain has a normal appearance without evidence of old or acute infarction, mass lesion, hemorrhage, hydrocephalus or extra-axial collection.  The patient does have atherosclerotic calcification of the major vessels at the base of the brain.  The calvarium is unremarkable.  Visualized sinuses, middle ears and mastoids are clear.  No skull or skull base lesion seen.  IMPRESSION: No brain abnormality.  Atherosclerosis.   Original Report Authenticated By: Paulina Fusi, M.D.   Ct Angio Chest Pe W/cm &/or Wo Cm  03/31/2013   *RADIOLOGY REPORT*  Clinical Data: Chest pain and dyspnea on exertion.  Evaluate for pulmonary embolus  CT ANGIOGRAPHY CHEST  Technique:  Multidetector CT imaging of the chest using the standard protocol during bolus administration of intravenous contrast. Multiplanar reconstructed images including MIPs were obtained and  reviewed to evaluate the vascular anatomy.  Contrast: OMNIPAQUE IOHEXOL 350 MG/ML SOLN  Comparison: 11/15/2012  Findings:  There is no pleural effusion.  There is no airspace consolidation identified.  Atelectasis is noted in the right lung base.  The heart size appears normal.  No pericardial effusion.  No mediastinal or hilar adenopathy identified.  The main pulmonary artery is patent.  There are no central filling defects identified.  The lobar and segmental pulmonary arteries also appear patent without evidence for acute embolus.  Review of the visualized osseous structures is significant for multilevel thoracic spondylosis.  There is no worrisome lytic or sclerotic bone lesions identified.  No acute osseous abnormalities identified.  Limited imaging through the upper abdomen is unremarkable.  IMPRESSION:  1.  No evidence for acute pulmonary embolus.   Original Report Authenticated By: Signa Kell, M.D.         Subjective: Patient denies fevers, chills, dizziness, chest pain, shortness breath, nausea, vomiting, diarrhea, abdominal pain. She is tolerating her diet.  Objective: Filed Vitals:   03/31/13 2334 04/01/13 0005 04/01/13 0500 04/01/13 1444  BP: 183/95 155/75 161/84 127/78  Pulse: 57  60 64  Temp: 98 F (36.7 C)  98.5 F (36.9 C) 98.6 F (37 C)  TempSrc: Oral  Oral Oral  Resp: 18  18 18   Height:      Weight:      SpO2: 99%  96% 97%    Intake/Output Summary (Last 24 hours) at 04/01/13 1818 Last data filed at 04/01/13 1800  Gross per 24 hour  Intake 1659.67 ml  Output    300 ml  Net 1359.67 ml   Weight change:  Exam:   General:  Pt is alert, follows commands appropriately, not in acute  distress  HEENT: No icterus, No thrush, [place]/AT  Cardiovascular: RRR, S1/S2, no rubs, no gallops  Respiratory: Diminished breath sounds at the bases but clear to auscultation. No wheezing.  Abdomen: Soft/+BS, non tender, non distended, no guarding  Extremities: 1+ edema, No  lymphangitis, No petechiae, No rashes, no synovitis; erythema with blanching and nonblanching components on the bilateral pretibial areas; no open or draining wounds, no crepitance  Data Reviewed: Basic Metabolic Panel:  Recent Labs Lab 03/30/13 1227 03/30/13 2045  NA 138 139  K 4.1 3.6  CL 101 103  CO2 30 29  GLUCOSE 117* 126*  BUN 21 17  CREATININE 0.86 0.84  CALCIUM 9.1 8.7  MG  --  1.8  PHOS  --  2.4   Liver Function Tests:  Recent Labs Lab 03/30/13 2045  AST 17  ALT 14  ALKPHOS 101  BILITOT 0.3  PROT 6.9  ALBUMIN 3.1*   No results found for this basename: LIPASE, AMYLASE,  in the last 168 hours No results found for this basename: AMMONIA,  in the last 168 hours CBC:  Recent Labs Lab 03/30/13 1227  WBC 6.3  NEUTROABS 4.3  HGB 11.6*  HCT 34.1*  MCV 93.9  PLT 234   Cardiac Enzymes:  Recent Labs Lab 03/30/13 1959 03/31/13 0045 03/31/13 0715  TROPONINI <0.30 <0.30 <0.30   BNP: No components found with this basename: POCBNP,  CBG: No results found for this basename: GLUCAP,  in the last 168 hours  No results found for this or any previous visit (from the past 240 hour(s)).   Scheduled Meds: . atenolol  100 mg Oral Daily  . camphor-menthol  1 application Topical BID  . clindamycin  300 mg Oral Q8H  . FLUoxetine  60 mg Oral Daily  . gabapentin  200 mg Oral TID  . hydrochlorothiazide  25 mg Oral Daily  . lisinopril  10 mg Oral Daily  . pantoprazole  40 mg Oral Daily  . tacrolimus   Topical BID  . triamcinolone cream   Topical BID  . tuberculin  5 Units Intradermal Once  . vancomycin  1,000 mg Intravenous Q12H  . vancomycin  1,000 mg Intravenous Once  . warfarin  5 mg Oral Daily  . Warfarin - Physician Dosing Inpatient   Does not apply q1800   Continuous Infusions: . sodium chloride 20 mL/hr at 03/30/13 1301     Devaughn Savant, DO  [facility] Pager [phone]  If 7PM-7AM, please contact night-coverage [email or web address] Password  [email or web address] 04/01/2013, 6:18 PM   LOS: 2 days

## 2013-04-01 NOTE — Progress Notes (Signed)
  Echocardiogram 2D Echocardiogram has been performed.  Nandini Bogdanski, [facility] 04/01/2013, 8:51 AM

## 2013-04-01 NOTE — Progress Notes (Signed)
   CARE MANAGEMENT NOTE 04/01/2013  Patient:  [name] [name], [name] [name]   Account Number:  [id number]  Date Initiated:  04/01/2013  Documentation initiated by:  Letha Cape  Subjective/Objective Assessment:   dx htn  admit- lives with spouse and son.     Action/Plan:   Anticipated DC Date:  04/03/2013   Anticipated DC Plan:  HOME W HOME HEALTH SERVICES  In-house referral  Clinical Social Worker      DC Planning Services  CM consult      Choice offered to / List presented to:             Status of service:  In process, will continue to follow Medicare Important Message given?   (If response is "NO", the following Medicare IM given date fields will be blank) Date Medicare IM given:   Date Additional Medicare IM given:    Discharge Disposition:    Per UR Regulation:  Reviewed for med. necessity/level of care/duration of stay  If discussed at Long Length of Stay Meetings, dates discussed:    Comments:  04/01/13 Letha Cape RN,BSN [phone] patient lives with spouse.  CSW received consult for SNF. NCM will continue to follow for dc needs.

## 2013-04-02 DIAGNOSIS — L03119 Cellulitis of unspecified part of limb: Secondary | ICD-10-CM

## 2013-04-02 DIAGNOSIS — F329 Major depressive disorder, single episode, unspecified: Secondary | ICD-10-CM

## 2013-04-02 LAB — CBC
HCT: 35.2 % — ABNORMAL LOW (ref 36.0–46.0)
Hemoglobin: 12.2 g/dL (ref 12.0–15.0)
MCHC: 34.7 g/dL (ref 30.0–36.0)
MCV: 93.6 fL (ref 78.0–100.0)
RDW: 14.9 % (ref 11.5–15.5)
WBC: 6.4 10*3/uL (ref 4.0–10.5)

## 2013-04-02 LAB — BASIC METABOLIC PANEL
BUN: 6 mg/dL (ref 6–23)
Chloride: 101 mEq/L (ref 96–112)
Creatinine, Ser: 0.97 mg/dL (ref 0.50–1.10)
GFR calc Af Amer: 67 mL/min — ABNORMAL LOW (ref >90)
Glucose, Bld: 148 mg/dL — ABNORMAL HIGH (ref 70–99)

## 2013-04-02 MED ORDER — WARFARIN SODIUM 5 MG PO TABS: 5.0000 mg | ORAL_TABLET | Freq: Every day | ORAL | Status: DC

## 2013-04-02 MED ORDER — DOXYCYCLINE HYCLATE 100 MG PO TABS
100.0000 mg | ORAL_TABLET | Freq: Two times a day (BID) | ORAL | Status: DC
  Administered 2013-04-02: 100 mg via ORAL
  Filled 2013-04-02 (×2): qty 1

## 2013-04-02 MED ORDER — POTASSIUM CHLORIDE CRYS ER 20 MEQ PO TBCR
20.0000 meq | EXTENDED_RELEASE_TABLET | Freq: Once | ORAL | Status: AC
  Administered 2013-04-02: 20 meq via ORAL
  Filled 2013-04-02 (×2): qty 1

## 2013-04-02 MED ORDER — DOXYCYCLINE HYCLATE 100 MG PO TABS: 100.0000 mg | ORAL_TABLET | Freq: Two times a day (BID) | ORAL | Status: DC

## 2013-04-02 MED ORDER — TRAMADOL HCL 50 MG PO TABS: 50.0000 mg | ORAL_TABLET | Freq: Four times a day (QID) | ORAL | Status: DC | PRN

## 2013-04-02 MED ORDER — WARFARIN SODIUM 2.5 MG PO TABS
2.5000 mg | ORAL_TABLET | Freq: Once | ORAL | Status: AC
  Administered 2013-04-02: 2.5 mg via ORAL
  Filled 2013-04-02: qty 1

## 2013-04-02 NOTE — Discharge Summary (Signed)
Pt DC to home, MD discharge instructions reviewed with pt including cellulitis education, no questions or concerns, rx for Doxycycline and Tramadol sent to her pharmacy for pick up, IV DC with no s/s of complications, home meds from pharmacy returned to pt. [place], Hospital doctor E

## 2013-04-02 NOTE — Evaluation (Signed)
Physical Therapy One Time Evaluation Patient Details Name: [name] [name] MRN: [id number] DOB: [birth year] Today's Date: 04/02/2013 Time: 4098-1191 PT Time Calculation (min): 16 min  PT Assessment / Plan / Recommendation History of Present Illness     Clinical Impression  Patient evaluated by Physical Therapy to see prior to d/c home and no further acute PT needs identified. All education has been completed and the patient has no further questions.  Pt reports spouse at home 24/7 and can assist if needed.  See below for any follow-up Physial Therapy or equipment needs. PT is signing off. Thank you for this referral.     PT Assessment  All further PT needs can be met in the next venue of care    Follow Up Recommendations  Home health PT    Does the patient have the potential to tolerate intense rehabilitation      Barriers to Discharge        Equipment Recommendations  None recommended by PT    Recommendations for Other Services     Frequency      Precautions / Restrictions Precautions Precautions: Fall   Pertinent Vitals/Pain C/o varicose vein pain in lower legs (states her normal)      Mobility  Bed Mobility Bed Mobility: Supine to Sit;Sit to Supine Supine to Sit: 6: Modified independent (Device/Increase time) Sit to Supine: 6: Modified independent (Device/Increase time) Transfers Transfers: Sit to Stand;Stand to Sit Sit to Stand: 5: Supervision Stand to Sit: 5: Supervision Ambulation/Gait Ambulation/Gait Assistance: 5: Supervision Ambulation Distance (Feet): 160 Feet Assistive device: Rolling walker Ambulation/Gait Assistance Details: requested RW for safety for hallway ambulation, encouraged using RW at home until feeling 100% Gait Pattern: Step-through pattern;Decreased stride length Gait velocity: very slow gait General Gait Details: pt reports no LE pain except varicose veins which is her usual    Exercises     PT Diagnosis: Generalized weakness  PT  Problem List: Decreased mobility;Decreased strength PT Treatment Interventions:       PT Goals(Current goals can be found in the care plan section) Acute Rehab PT Goals PT Goal Formulation: No goals set, d/c therapy  Visit Information  Last PT Received On: 04/02/13 Assistance Needed: +1       Prior Functioning  Home Living Family/patient expects to be discharged to:: Private residence Living Arrangements: Spouse/significant other;Children Type of Home: House Home Access: Ramped entrance Home Layout: One level Home Equipment: Environmental consultant - 2 wheels;Bedside commode;Cane - single point;Wheelchair - manual Prior Function Level of Independence: Independent with assistive device(s) Comments: states she uses a cane at home Communication Communication: No difficulties    Cognition  Cognition Arousal/Alertness: Awake/alert Behavior During Therapy: WFL for tasks assessed/performed Overall Cognitive Status: Within Functional Limits for tasks assessed    Extremity/Trunk Assessment Lower Extremity Assessment Lower Extremity Assessment: Generalized weakness   Balance    End of Session PT - End of Session Activity Tolerance: Patient tolerated treatment well Patient left: in bed;with call bell/phone within reach Nurse Communication: Mobility status (observed pt ambulating in hallway)  GP Functional Assessment Tool Used: clinical judgement Functional Limitation: Mobility: Walking and moving around Mobility: Walking and Moving Around Current Status ([id number]): At least 1 percent but less than 20 percent impaired, limited or restricted Mobility: Walking and Moving Around Goal Status [phone]): At least 1 percent but less than 20 percent impaired, limited or restricted Mobility: Walking and Moving Around Discharge Status [phone]): At least 1 percent but less than 20 percent impaired, limited or restricted  Hurman Ketelsen,KATHrine E 04/02/2013, 1:48 PM Zenovia Jarred, PT, DPT 04/02/2013 Pager: [phone]

## 2013-04-02 NOTE — Care Management Note (Signed)
    Page 1 of 2   04/02/2013     10:46:12 AM   CARE MANAGEMENT NOTE 04/02/2013  Patient:  [name] [name], [name] [name]   Account Number:  [id number]  Date Initiated:  04/01/2013  Documentation initiated by:  Letha Cape  Subjective/Objective Assessment:   dx htn  admit- lives with spouse and son.     Action/Plan:   pt eval   Anticipated DC Date:  04/02/2013   Anticipated DC Plan:  HOME W HOME HEALTH SERVICES  In-house referral  Clinical Social Worker      DC Associate Professor  CM consult      [facility] Choice  HOME HEALTH   Choice offered to / List presented to:  C-1 Patient        HH arranged  HH-1 RN  HH-2 PT  HH-3 OT  HH-4 NURSE'S AIDE      Status of service:  Completed, signed off Medicare Important Message given?   (If response is "NO", the following Medicare IM given date fields will be blank) Date Medicare IM given:   Date Additional Medicare IM given:    Discharge Disposition:  HOME W HOME HEALTH SERVICES  Per UR Regulation:  Reviewed for med. necessity/level of care/duration of stay  If discussed at Long Length of Stay Meetings, dates discussed:    Comments:  04/02/13  Letha Cape RN, BSN [phone] patient is for dc today, she will have pt eval, patient chose [facility] for [facility] needs,  made referral to [facility] for [facility] for medication management, PT, OT, and aide.  Soc will begin 24-48 hrs post discharge.  04/01/13 Letha Cape RN,BSN [phone] patient lives with spouse.  CSW received consult for SNF. NCM will continue to follow for dc needs.

## 2013-04-02 NOTE — Discharge Summary (Signed)
Physician Discharge Summary  [name] [name] EXB:[id number] DOB: 27-Apr-1941 DOA: 03/30/2013  PCP: Janell Quiet, FNP  Admit date: 03/30/2013 Discharge date: 04/02/2013  Recommendations for Outpatient Follow-up:  1. Pt will need to follow up with PCP in 1 weeks post discharge 2. Please obtain BMP to evaluate electrolytes and kidney function 3. Please also check CBC to evaluate Hg and Hct levels 4. Please check INR within one week and adjust coumadin accordingly  Discharge Diagnoses:  Cellulitis lower extremities  -Patient was on her fourth day of vancomycin on the day of discharge -Overall erythema has improved, but there were patchy areas of nonblanching erythema -Patient states the pain has improved over 75% -Discontinue clindamycin  -Patient will go home on doxycycline 100 mg twice a day x7 days which will complete 10 days of therapy Psoriasis  -Continue home protopic and triamcinolone  -Patient is to followup with her dermatologist Lower extremity edema and pain  -Venous Dopplers negative for DVT  -On the day of discharge, the patient states that she had no lower extremity pain Hypertension  -Continue lisinopril  Dyspnea on exertion  -Clinically stable  -oxygen saturation 99% on room air  -Echocardiogram EF 60-65%, grade 1 diastolic dysfunction  -Currently no shortness of breath  -CT angiogram chest negative for pulmonary embolus  -Patient was able to ambulate interim in the hallway without worsening dyspnea -home health with PT was set up prior to d/c History of DVT/PE  -Continued warfarin throughout the hospitalization -Patient was given 2.5 mg of warfarin on the day of discharge; the patient was instructed to followup with her primary care provider for INR recheck within the next week -After consultation with pharmacy, it was recommended the patient can go home with 5 mg daily of warfarin   Discharge Condition: stable  Disposition: home  Diet:heart healthy Wt  Readings from Last 3 Encounters:  03/30/13 113 kg (249 lb 1.9 oz)  03/01/13 111.755 kg (246 lb 6 oz)  10/11/12 108.863 kg (240 lb)    History of present illness:  72 y.o. BF, past medical history of HTN, PE (on chronic Coumadin), HLD, schizophrenia, depression, asthma, lung nodule, drug abuse, newly diagnosed psoriasis by biopsy(January 2014 biopsy of left lower leg at [facility]). Patient was started on camphor-menthol plus Protopic oint + Triamcinalone cream. Patient states the patient has been used as directed but has not resolved problem. States 2 weeks ago left lower leg began to swell become erythematous, become painful described as burning. The symptom then began to to invade the right lower leg. Also complains of right lower calf pain, with increasing DOE + CP relieved with rest which started in January 2014. Patient currently therapeutic on Coumadin last INR 2.76 on 03/30/2013. Cardiac enzymes x3 negative.       Discharge Exam: Filed Vitals:   04/02/13 0516  BP: 134/76  Pulse: 59  Temp: 98.7 F (37.1 C)  Resp: 18   Filed Vitals:   04/01/13 0500 04/01/13 1444 04/01/13 2017 04/02/13 0516  BP: 161/84 127/78 124/78 134/76  Pulse: 60 64 63 59  Temp: 98.5 F (36.9 C) 98.6 F (37 C)  98.7 F (37.1 C)  TempSrc: Oral Oral  Oral  Resp: 18 18  18   Height:      Weight:      SpO2: 96% 97%  96%   General: A&O x 3, NAD, pleasant, cooperative Cardiovascular: RRR, no rub, no gallop, no S3 Respiratory: CTAB, no wheeze, no rhonchi Abdomen:soft, nontender, nondistended, positive bowel  sounds Extremities: No edema, No lymphangitis, no petechiae  Discharge Instructions      Discharge Orders   Future Appointments Provider Department Dept Phone   04/08/2013 10:30 AM Huston Foley, MD [facility] [phone]   05/05/2013 3:00 PM [facility] [phone]   05/05/2013 3:15 PM [facility] [phone]   05/05/2013 3:30 PM Levert Feinstein, MD [facility] [phone]   Future Orders Complete By Expires     Diet - low sodium heart healthy  As directed     Discharge instructions  As directed     Comments:      Restart coumadin 5 mg daily on 04/03/13.  Do not take any coumadin today.  Follow up with your family doctor for coumadin level check 04/03/13.    Increase activity slowly  As directed         Medication List    STOP taking these medications       PRESCRIPTION MEDICATION      TAKE these medications       atenolol 100 MG tablet  Commonly known as:  TENORMIN  Take 100 mg by mouth daily.     camphor-menthol lotion  Commonly known as:  SARNA  Apply 1 application topically 2 (two) times daily.     doxycycline 100 MG tablet  Commonly known as:  VIBRA-TABS  Take 1 tablet (100 mg total) by mouth every 12 (twelve) hours.     esomeprazole 40 MG capsule  Commonly known as:  NEXIUM  Take 40 mg by mouth daily before breakfast.     FLUoxetine 20 MG capsule  Commonly known as:  PROZAC  Take 60 mg by mouth daily.     fluticasone 220 MCG/ACT inhaler  Commonly known as:  FLOVENT HFA  Inhale 1 puff into the lungs 2 (two) times daily as needed (wheezing and shortness of breath).     gabapentin 100 MG capsule  Commonly known as:  NEURONTIN  Take 200 mg by mouth 3 (three) times daily.     hydrochlorothiazide 25 MG tablet  Commonly known as:  HYDRODIURIL  Take 1 tablet (25 mg total) by mouth daily.     HYDROcodone-acetaminophen 5-325 MG per tablet  Commonly known as:  NORCO/VICODIN  Take 1 tablet by mouth every 8 (eight) hours as needed for pain.     lisinopril 5 MG tablet  Commonly known as:  PRINIVIL,ZESTRIL  Take 5 mg by mouth daily.     meclizine 25 MG tablet  Commonly known as:  ANTIVERT  Take 25 mg by mouth 3 (three) times daily as needed for nausea.     tacrolimus 0.1 % ointment  Commonly known  as:  PROTOPIC  Apply 1 application topically 2 (two) times daily as needed (Psoriasis).     VENTOLIN HFA 108 (90 BASE) MCG/ACT inhaler  Generic drug:  albuterol  Inhale 2 puffs into the lungs 2 (two) times daily as needed for wheezing or shortness of breath.     warfarin 5 MG tablet  Commonly known as:  COUMADIN  Take 5 mg by mouth daily.         The results of significant diagnostics from this hospitalization (including imaging, microbiology, ancillary and laboratory) are listed below for reference.    Significant Diagnostic Studies: Ct Head Wo Contrast  03/18/2013   *RADIOLOGY REPORT*  Clinical Data: Dizziness  CT HEAD WITHOUT  CONTRAST  Technique:  Contiguous axial images were obtained from the base of the skull through the vertex without contrast.  Comparison: 01/21/2013 and multiple previous  Findings: The brain has a normal appearance without evidence of old or acute infarction, mass lesion, hemorrhage, hydrocephalus or extra-axial collection.  The patient does have atherosclerotic calcification of the major vessels at the base of the brain.  The calvarium is unremarkable.  Visualized sinuses, middle ears and mastoids are clear.  No skull or skull base lesion seen.  IMPRESSION: No brain abnormality.  Atherosclerosis.   Original Report Authenticated By: Paulina Fusi, M.D.   Ct Angio Chest Pe W/cm &/or Wo Cm  03/31/2013   *RADIOLOGY REPORT*  Clinical Data: Chest pain and dyspnea on exertion.  Evaluate for pulmonary embolus  CT ANGIOGRAPHY CHEST  Technique:  Multidetector CT imaging of the chest using the standard protocol during bolus administration of intravenous contrast. Multiplanar reconstructed images including MIPs were obtained and reviewed to evaluate the vascular anatomy.  Contrast: OMNIPAQUE IOHEXOL 350 MG/ML SOLN  Comparison: 11/15/2012  Findings:  There is no pleural effusion.  There is no airspace consolidation identified.  Atelectasis is noted in the right lung base.  The  heart size appears normal.  No pericardial effusion.  No mediastinal or hilar adenopathy identified.  The main pulmonary artery is patent.  There are no central filling defects identified.  The lobar and segmental pulmonary arteries also appear patent without evidence for acute embolus.  Review of the visualized osseous structures is significant for multilevel thoracic spondylosis.  There is no worrisome lytic or sclerotic bone lesions identified.  No acute osseous abnormalities identified.  Limited imaging through the upper abdomen is unremarkable.  IMPRESSION:  1.  No evidence for acute pulmonary embolus.   Original Report Authenticated By: Signa Kell, M.D.     Microbiology: No results found for this or any previous visit (from the past 240 hour(s)).   Labs: Basic Metabolic Panel:  Recent Labs Lab 03/30/13 1227 03/30/13 2045 04/02/13 0558  NA 138 139 140  K 4.1 3.6 3.3*  CL 101 103 101  CO2 30 29 27   GLUCOSE 117* 126* 148*  BUN 21 17 6   CREATININE 0.86 0.84 0.97  CALCIUM 9.1 8.7 9.5  MG  --  1.8  --   PHOS  --  2.4  --    Liver Function Tests:  Recent Labs Lab 03/30/13 2045  AST 17  ALT 14  ALKPHOS 101  BILITOT 0.3  PROT 6.9  ALBUMIN 3.1*   No results found for this basename: LIPASE, AMYLASE,  in the last 168 hours No results found for this basename: AMMONIA,  in the last 168 hours CBC:  Recent Labs Lab 03/30/13 1227 04/02/13 0558  WBC 6.3 6.4  NEUTROABS 4.3  --   HGB 11.6* 12.2  HCT 34.1* 35.2*  MCV 93.9 93.6  PLT 234 237   Cardiac Enzymes:  Recent Labs Lab 03/30/13 1959 03/31/13 0045 03/31/13 0715  TROPONINI <0.30 <0.30 <0.30   BNP: No components found with this basename: POCBNP,  CBG: No results found for this basename: GLUCAP,  in the last 168 hours  Time coordinating discharge:  Greater than 30 minutes  Signed:  Nijee Heatwole, DO [facility] Pager: [phone] 04/02/2013, 11:55 AM

## 2013-04-03 LAB — OPIATE, QUANTITATIVE, URINE
6 Monoacetylmorphine, Ur-Confirm: NEGATIVE ng/mL
Hydrocodone: 954 ng/mL
Hydromorphone GC/MS Conf: NEGATIVE ng/mL
Norhydrocodone, Ur: >2500 ng/mL
Noroxycodone, Ur: 1025 ng/mL

## 2013-04-03 MED FILL — Tacrolimus Oint 0.1%: CUTANEOUS | Qty: 30 | Status: AC

## 2013-04-08 ENCOUNTER — Encounter (HOSPITAL_COMMUNITY): Payer: Self-pay | Admitting: Nurse Practitioner

## 2013-04-08 ENCOUNTER — Ambulatory Visit (INDEPENDENT_AMBULATORY_CARE_PROVIDER_SITE_OTHER): Payer: PRIVATE HEALTH INSURANCE | Admitting: Neurology

## 2013-04-08 ENCOUNTER — Encounter: Payer: Self-pay | Admitting: Neurology

## 2013-04-08 ENCOUNTER — Emergency Department (HOSPITAL_COMMUNITY)
Admission: EM | Admit: 2013-04-08 | Discharge: 2013-04-08 | Disposition: A | Discharge status: Home / Self Care | Payer: PRIVATE HEALTH INSURANCE | Attending: Emergency Medicine | Admitting: Emergency Medicine

## 2013-04-08 VITALS — BP 149/87 | HR 60 | Temp 98.2°F | Ht 65.0 in | Wt 247.0 lb

## 2013-04-08 DIAGNOSIS — Z8739 Personal history of other diseases of the musculoskeletal system and connective tissue: Secondary | ICD-10-CM | POA: Insufficient documentation

## 2013-04-08 DIAGNOSIS — N898 Other specified noninflammatory disorders of vagina: Secondary | ICD-10-CM | POA: Insufficient documentation

## 2013-04-08 DIAGNOSIS — Z8659 Personal history of other mental and behavioral disorders: Secondary | ICD-10-CM | POA: Insufficient documentation

## 2013-04-08 DIAGNOSIS — J45909 Unspecified asthma, uncomplicated: Secondary | ICD-10-CM | POA: Insufficient documentation

## 2013-04-08 DIAGNOSIS — G8929 Other chronic pain: Secondary | ICD-10-CM | POA: Insufficient documentation

## 2013-04-08 DIAGNOSIS — I10 Essential (primary) hypertension: Secondary | ICD-10-CM | POA: Insufficient documentation

## 2013-04-08 DIAGNOSIS — F411 Generalized anxiety disorder: Secondary | ICD-10-CM | POA: Insufficient documentation

## 2013-04-08 DIAGNOSIS — Z8709 Personal history of other diseases of the respiratory system: Secondary | ICD-10-CM | POA: Insufficient documentation

## 2013-04-08 DIAGNOSIS — F329 Major depressive disorder, single episode, unspecified: Secondary | ICD-10-CM | POA: Insufficient documentation

## 2013-04-08 DIAGNOSIS — Z872 Personal history of diseases of the skin and subcutaneous tissue: Secondary | ICD-10-CM | POA: Insufficient documentation

## 2013-04-08 DIAGNOSIS — Z79899 Other long term (current) drug therapy: Secondary | ICD-10-CM | POA: Insufficient documentation

## 2013-04-08 DIAGNOSIS — N949 Unspecified condition associated with female genital organs and menstrual cycle: Secondary | ICD-10-CM | POA: Insufficient documentation

## 2013-04-08 DIAGNOSIS — Z7901 Long term (current) use of anticoagulants: Secondary | ICD-10-CM | POA: Insufficient documentation

## 2013-04-08 DIAGNOSIS — G4733 Obstructive sleep apnea (adult) (pediatric): Secondary | ICD-10-CM

## 2013-04-08 DIAGNOSIS — Z86718 Personal history of other venous thrombosis and embolism: Secondary | ICD-10-CM | POA: Insufficient documentation

## 2013-04-08 DIAGNOSIS — E785 Hyperlipidemia, unspecified: Secondary | ICD-10-CM | POA: Insufficient documentation

## 2013-04-08 DIAGNOSIS — Z862 Personal history of diseases of the blood and blood-forming organs and certain disorders involving the immune mechanism: Secondary | ICD-10-CM | POA: Insufficient documentation

## 2013-04-08 DIAGNOSIS — G2581 Restless legs syndrome: Secondary | ICD-10-CM

## 2013-04-08 DIAGNOSIS — Z8669 Personal history of other diseases of the nervous system and sense organs: Secondary | ICD-10-CM | POA: Insufficient documentation

## 2013-04-08 DIAGNOSIS — Z8701 Personal history of pneumonia (recurrent): Secondary | ICD-10-CM | POA: Insufficient documentation

## 2013-04-08 DIAGNOSIS — F3289 Other specified depressive episodes: Secondary | ICD-10-CM | POA: Insufficient documentation

## 2013-04-08 DIAGNOSIS — Z8679 Personal history of other diseases of the circulatory system: Secondary | ICD-10-CM | POA: Insufficient documentation

## 2013-04-08 DIAGNOSIS — B372 Candidiasis of skin and nail: Secondary | ICD-10-CM | POA: Insufficient documentation

## 2013-04-08 DIAGNOSIS — R21 Rash and other nonspecific skin eruption: Secondary | ICD-10-CM | POA: Insufficient documentation

## 2013-04-08 DIAGNOSIS — K219 Gastro-esophageal reflux disease without esophagitis: Secondary | ICD-10-CM | POA: Insufficient documentation

## 2013-04-08 LAB — URINE MICROSCOPIC-ADD ON

## 2013-04-08 LAB — URINALYSIS, ROUTINE W REFLEX MICROSCOPIC
Bilirubin Urine: NEGATIVE
Glucose, UA: NEGATIVE mg/dL
Hgb urine dipstick: NEGATIVE
Protein, ur: NEGATIVE mg/dL
Urobilinogen, UA: 0.2 mg/dL (ref 0.0–1.0)

## 2013-04-08 MED ORDER — PRAMIPEXOLE DIHYDROCHLORIDE 0.125 MG PO TABS: ORAL_TABLET | ORAL | Status: DC

## 2013-04-08 MED ORDER — CLOTRIMAZOLE 1 % EX CREA: TOPICAL_CREAM | CUTANEOUS | Status: DC

## 2013-04-08 NOTE — ED Notes (Signed)
Pt reports she has been on oral antibiotics for  infection and now thinks she has developed a yeast infection. Pt with external vaginal itching and burning for past week. Tried to get pcp appt they are unable to see her until the 30th

## 2013-04-08 NOTE — Progress Notes (Signed)
Subjective:    Patient ID: [name] [name] is a 72 y.o. female.  HPI  Huston Foley, MD, PhD [facility] [address], [address] [address] [place], [place] [address]  Dear Dr. Leavy Cella,   I saw your patient, [name] [name], upon your kind request in my neurologic clinic today for initial consultation of her sleep related complaints including obstructive sleep apnea and RLS. The patient is accompanied by her husband today. As you know, [name] [name] is a very pleasant 72 year old right-handed woman with an underlying medical history of hypertension, arthralgia, chronic pain, DVT, varicose veins, depression and chronic bronchitis and back disease, s/p back surgery, who was previously diagnosed with obstructive sleep apnea as well as restless leg syndrome. She was previously treated with Mirpex, which helped. She was seeing Dr. Vickey Huger in our office some years ago. She had a sleep study over 4 years ago and was told she had OSA and was in the process to get a CPAP machine, but somehow or another she never had a CPAP machine.   Her current medications include fluoxetine, meclizine, hydrochlorothiazide, atenolol, hydroxyzine, warfarin, Nexium, multivitamin, Ventolin, Flovent.  Her typical bedtime is reported to be around 2 AM and usual wake time is around 6:30 AM. She and her husband take care of their 14 year-old son who is disabled. Sleep onset typically occurs within a few minutes. She reports feeling not rested upon awakening. She wakes up on an average 5 or 6 times in the middle of the night and has to go to the bathroom 2 times on a typical night. She does tend to check on their son during the night as he has epilepsy. She admits to rare morning headaches.  She reports excessive daytime somnolence (EDS) and Her Epworth Sleepiness Score (ESS) is 22/24 today. She has not fallen asleep while driving. The patient has been taking an unplanned nap, which is usually 3 hours long. She reports  dreaming in a nap and reports feeling refreshed after a nap.  She has not been known to snore and it is unclear if she has witnessed apneas. The patient admits to a sense of choking or strangling feeling. There is no report of nighttime reflux, with frequent nighttime cough experienced. The patient has residual severe RLS symptoms at this time and is known to kick while asleep or before falling asleep. She is a restless sleeper and in the morning, the bed is quite disheveled.   She denies cataplexy, sleep paralysis, hypnagogic or hypnopompic hallucinations, or sleep attacks. She does report any vivid dreams, but no nightmares, dream enactments, or parasomnias, such as sleep talking or sleep walking.   She consumes 6-7 caffeinated beverages per day, usually in the form of soda and coffee.  Her son has OSA and has a CPAP machine.  She has chronic LBP and cannot sleep on her side.   Her Past Medical History Is Significant For: Past Medical History  Diagnosis Date  . Depression   . Schizophrenia   . Hyperlipidemia   . Hypertension   . Chronic pain     "over my whole body" (03/30/2013)  . 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 DVT (deep venous thrombosis)     per 01/17/10 d/c summary- "remote hx of dvt"?  . Iron deficiency anemia   . Glaucoma   . Hemorrhoids   . Asthma   . Anxiety   . GERD (gastroesophageal reflux disease)   . Psoriasis  04/29/2012    New onset evaluated by Dr Marylou Flesher, Dermatology, [facility] 6/13  Rx 0.1% Tacrolimus ointment  . Complication of anesthesia     "because I have sleep apnea" (03/30/2013)  . Heart murmur   . Chest pain, exertional   . Chronic bronchitis   . Sleep apnea     "waiting on my CPAP" (03/30/2013)  . Pneumonia     "a few times" (03/30/2013)  . Exertional shortness of breath     "& sometimes when laying down" (03/30/2013)  . Daily headache     "last 2 months" (03/30/2013)  . Arthritis     "joints" (03/30/2013)  . Varicose veins  of legs     Her Past Surgical History Is Significant For: Past Surgical History  Procedure Laterality Date  . Spinal fusion      2004  . Carpal tunnel release Bilateral   . Tubal ligation  1973  . Dilation and curettage of uterus  1970's    "once" (03/30/2013)  . 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 Joint replacement      Her Family History Is Significant For: Family History  Problem Relation Age of Onset  . Diabetes Sister   . Colon cancer Brother 40    Her Social History Is Significant For: History   Social History  . Marital Status: Married    Spouse Name: N/A    Number of Children: N/A  . Years of Education: N/A   Occupational History  . 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 Smoking status: Never Smoker   . Smokeless tobacco: Never Used  . Alcohol Use: No  . Drug Use: No  . Sexually Active: No   Other Topics Concern  . None   Social History Narrative   Lives with her husband, [name], and her son, [name].  Has 2 other children.  [name] is mentally handicapped and has a severe seizure disorder, with PEG in place. 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    Her Allergies Are:  No Known Allergies:   Her Current Medications Are:  Outpatient Encounter Prescriptions as of 04/08/2013  Medication Sig Dispense Refill  . albuterol (VENTOLIN HFA) 108 (90 BASE) MCG/ACT inhaler Inhale 2 puffs into the lungs 2 (two) times daily as needed for wheezing or shortness of breath.       Marland Kitchen atenolol (TENORMIN) 100 MG tablet Take 100 mg by mouth daily.       . camphor-menthol (SARNA) lotion Apply 1 application topically 2 (two) times daily.      Marland Kitchen doxycycline (VIBRA-TABS) 100 MG tablet Take 1 tablet (100 mg total) by mouth every 12 (twelve) hours.  14 tablet  0  . esomeprazole (NEXIUM) 40 MG capsule Take 40 mg by mouth daily before breakfast.       . FLUoxetine (PROZAC) 20 MG capsule Take 60 mg by mouth daily.       . fluticasone (FLOVENT HFA) 220 MCG/ACT inhaler  Inhale 1 puff into the lungs 2 (two) times daily as needed (wheezing and shortness of breath).       . gabapentin (NEURONTIN) 100 MG capsule Take 200 mg by mouth 3 (three) times daily.      . hydrochlorothiazide (HYDRODIURIL) 25 MG tablet Take 1 tablet (25 mg total) by mouth daily.  30 tablet  0  . HYDROcodone-acetaminophen (NORCO/VICODIN) 5-325 MG per tablet Take 1 tablet by mouth every 8 (eight) hours as needed for  pain.      . lisinopril (PRINIVIL,ZESTRIL) 5 MG tablet Take 5 mg by mouth daily.      . meclizine (ANTIVERT) 25 MG tablet Take 25 mg by mouth 3 (three) times daily as needed for nausea.      Marland Kitchen tacrolimus (PROTOPIC) 0.1 % ointment Apply 1 application topically 2 (two) times daily as needed (Psoriasis).       . traMADol (ULTRAM) 50 MG tablet Take 1 tablet (50 mg total) by mouth every 6 (six) hours as needed for pain.  15 tablet  0  . triamcinolone cream (KENALOG) 0.1 %       . warfarin (COUMADIN) 5 MG tablet Take 5 mg by mouth daily.        No facility-administered encounter medications on file as of 04/08/2013.  :  Review of Systems  Constitutional: Positive for fatigue.  Respiratory: Positive for cough.   Cardiovascular: Positive for leg swelling.  Skin: Positive for rash.  Allergic/Immunologic: Positive for environmental allergies.  Neurological: Positive for headaches.       Memory loss, restless leg, sleepiness  Hematological: Bruises/bleeds easily.  Psychiatric/Behavioral: Positive for confusion, sleep disturbance and dysphoric mood.    Objective:  Neurologic Exam  Physical Exam Physical Examination:   Filed Vitals:   04/08/13 1122  BP: 149/87  Pulse: 60  Temp: 98.2 F (36.8 C)    General Examination: The patient is a very pleasant 72 y.o. female in no acute distress. She appears well-developed and well-nourished and well groomed. She is obese.  HEENT: Normocephalic, atraumatic, pupils are equal, round and reactive to light and accommodation. Funduscopic exam  is normal with sharp disc margins noted. Extraocular tracking is good without limitation to gaze excursion or nystagmus noted. Normal smooth pursuit is noted. Hearing is grossly intact. Tympanic membranes are clear bilaterally. Face is symmetric with normal facial animation and normal facial sensation. Speech is clear with no dysarthria noted. There is no hypophonia. There is no lip, neck/head, jaw or voice tremor. Neck is supple with full range of passive and active motion. There are no carotid bruits on auscultation. Oropharynx exam reveals: mild mouth dryness, adequate dental hygiene and moderate airway crowding, due to large tongue, narrow airway and the R tonsil may have grown back or was only partially removed. Mallampati is class III. Tongue protrudes centrally and palate elevates symmetrically. Neck size is 17 inches.   Chest: Clear to auscultation without wheezing, rhonchi or crackles noted.  Heart: S1+S2+0, regular and normal without murmurs, rubs or gallops noted.   Abdomen: Soft, non-tender and non-distended with normal bowel sounds appreciated on auscultation.  Extremities: There is 2+ pitting edema in the distal lower extremities bilaterally. Pedal pulses are intact. Chronic venous stasis is noted.   Skin: Warm and dry without trophic changes noted. There are severe varicosis and spider veins.  Musculoskeletal: exam reveals no obvious joint deformities, tenderness or joint swelling or erythema.   Neurologically:  Mental status: The patient is awake, alert and oriented in all 4 spheres. Her memory, attention, language and knowledge are appropriate. There is no aphasia, agnosia, apraxia or anomia. Speech is clear with normal prosody and enunciation. Thought process is linear. Mood is congruent and affect is normal.  Cranial nerves are as described above under HEENT exam. In addition, shoulder shrug is normal with equal shoulder height noted. Motor exam: Normal bulk, strength and tone is  noted. There is no drift, tremor or rebound. Romberg is negative. Reflexes are 2+ throughout. Toes are  downgoing bilaterally. Fine motor skills are intact with normal finger taps, normal hand movements, normal rapid alternating patting, normal foot taps and normal foot agility.  Cerebellar testing shows no dysmetria or intention tremor on finger to nose testing. Heel to shin is unremarkable bilaterally. There is no truncal or gait ataxia.  Sensory exam is intact to light touch, pinprick, vibration, temperature sense and proprioception in the upper and lower extremities.  Gait, station and balance are unremarkable. No veering to one side is noted. No leaning to one side is noted. Posture is age-appropriate and stance is narrow based. No problems turning are noted. She turns en bloc. Tandem walk is unremarkable. Intact toe and heel stance is noted.               Assessment and Plan:   In summary, SHAKELA DONATI is a very pleasant 72 y.o.-year old female with a history and physical exam concerning for obstructive sleep apnea (OSA). I had a long chat with the patient and her husband about my findings and the diagnosis, its prognosis and treatment options. We talked about medical treatments and non-pharmacological approaches. I explained in particular the risks and ramifications of untreated moderate to severe OSA, especially with respect to developing cardiovascular disease down the Road, including congestive heart failure, difficult to treat hypertension, cardiac arrhythmias, or stroke. Even type 2 diabetes has in part been linked to untreated OSA. We talked about trying to maintain a healthy lifestyle in general, as well as the importance of weight control. I encouraged the patient to eat healthy, exercise daily and keep well hydrated, to keep a scheduled bedtime and wake time routine, to not skip any meals and eat healthy snacks in between meals.  I recommended the following at this time: sleep study with  potential CPAP titration.  For her RLS, I will restart her on Mirapex, since it worked well in the past and she tolerated it well. We will start at 0.125 mg nightly with build up to 0.375 mg nightly. I explained the sleep test procedure to the patient and also outlined surgical and non-surgical treatment options of OSA including the use of a dental custom-made appliance, upper airway surgery such as pillar implants, radiofrequency surgery, tongue base surgery, and UPPP. I also explained the CPAP treatment option to the patient, who indicated that she would be willing to try CPAP if the need arises. I explained the importance of being compliant with PAP treatment, not only for insurance purposes but primarily for the patient's long term health benefit. I answered all their questions today and the patient and her husband were in agreement. I would like to see her back after the sleep study is completed and encouraged them to call with any interim questions, concerns, problems or updates.   Thank you very much for allowing me to participate in the care of this nice patient. If I can be of any further assistance to you please do not hesitate to call me at [phone].  Sincerely,   Huston Foley, MD, PhD

## 2013-04-08 NOTE — Patient Instructions (Addendum)
Based on your symptoms and your exam I believe you are at risk for obstructive sleep apnea or OSA, and I think we should proceed with a sleep study to determine whether you do or do not have OSA and how severe it is. If you have more than mild OSA, I want you to consider treatment with CPAP. Please remember, the risks and ramifications of moderate to severe obstructive sleep apnea or OSA are: Cardiovascular disease, including congestive heart failure, stroke, difficult to control hypertension, arrhythmias, and even type 2 diabetes has been linked to untreated OSA. Sleep apnea causes disruption of sleep and sleep deprivation in most cases, which, in turn, can cause recurrent headaches, problems with memory, mood, concentration, focus, and vigilance. Most people with untreated sleep apnea report excessive daytime sleepiness, which can affect their ability to drive. Please do not drive if you feel sleepy.  I will see you back after your sleep study to go over the test results and where to go from there. We will call you after your sleep study and to set up an appointment at the time.   For your RLS, I will start you back on Mirapex 0.125 mg: Take 1 pill each night for 1 week, the 2 pills each night for 1 week, then 3 pills each night thereafter. Common side effects reported are: Sedation, sleepiness, nausea, vomiting, and rare side effects are confusion, hallucinations, swelling in legs.

## 2013-04-08 NOTE — ED Provider Notes (Signed)
History    CSN: [id number] Arrival date & time 04/08/13  1311  First MD Initiated Contact with Patient 04/08/13 1415     Chief Complaint  Patient presents with  . Vaginitis   (Consider location/radiation/quality/duration/timing/severity/associated sxs/prior Treatment) HPI Pt recently admitted for cellulitis to lower ext and was started on IV abx. She has recently developed white vaginal d/c and pruritic vulvar rash. She is concerned she has developed yeast infection. No fever, chills, N/V. No urinary symptoms.  Past Medical History  Diagnosis Date  . Depression   . Schizophrenia   . Hyperlipidemia   . Hypertension   . Chronic pain     "over my whole body" (03/30/2013)  . 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 DVT (deep venous thrombosis)     per 01/17/10 d/c summary- "remote hx of dvt"?  . Iron deficiency anemia   . Glaucoma   . Hemorrhoids   . Asthma   . Anxiety   . GERD (gastroesophageal reflux disease)   . Psoriasis 04/29/2012    New onset evaluated by Dr Marylou Flesher, Dermatology, [facility] 6/13  Rx 0.1% Tacrolimus ointment  . Complication of anesthesia     "because I have sleep apnea" (03/30/2013)  . Heart murmur   . Chest pain, exertional   . Chronic bronchitis   . Sleep apnea     "waiting on my CPAP" (03/30/2013)  . Pneumonia     "a few times" (03/30/2013)  . Exertional shortness of breath     "& sometimes when laying down" (03/30/2013)  . Daily headache     "last 2 months" (03/30/2013)  . Arthritis     "joints" (03/30/2013)  . Varicose veins of legs    Past Surgical History  Procedure Laterality Date  . Spinal fusion      2004  . Carpal tunnel release Bilateral   . Tubal ligation  1973  . Dilation and curettage of uterus  1970's    "once" (03/30/2013)  . 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 Joint replacement     Family History  Problem Relation Age of Onset  . Diabetes Sister   . Colon cancer Brother 40   History  Substance Use  Topics  . Smoking status: Never Smoker   . Smokeless tobacco: Never Used  . Alcohol Use: No   OB History   Grav Para Term Preterm Abortions TAB SAB Ect Mult Living                 Review of Systems  Constitutional: Negative for fever and chills.  Gastrointestinal: Negative for nausea, vomiting and abdominal pain.  Genitourinary: Positive for vaginal discharge and pelvic pain. Negative for vaginal bleeding.  Skin: Positive for rash.  Neurological: Negative for dizziness, weakness, numbness and headaches.  All other systems reviewed and are negative.    Allergies  Review of patient's allergies indicates no known allergies.  Home Medications   Current Outpatient Rx  Name  Route  Sig  Dispense  Refill  . albuterol (VENTOLIN HFA) 108 (90 BASE) MCG/ACT inhaler   Inhalation   Inhale 2 puffs into the lungs 2 (two) times daily as needed for wheezing or shortness of breath.          Marland Kitchen atenolol (TENORMIN) 100 MG tablet   Oral   Take 100 mg by mouth daily.          . camphor-menthol (SARNA) lotion   Topical  Apply 1 application topically 2 (two) times daily.         Marland Kitchen doxycycline (VIBRA-TABS) 100 MG tablet   Oral   Take 1 tablet (100 mg total) by mouth every 12 (twelve) hours.   14 tablet   0   . esomeprazole (NEXIUM) 40 MG capsule   Oral   Take 40 mg by mouth daily before breakfast.          . FLUoxetine (PROZAC) 20 MG capsule   Oral   Take 60 mg by mouth daily.          . fluticasone (FLOVENT HFA) 220 MCG/ACT inhaler   Inhalation   Inhale 1 puff into the lungs 2 (two) times daily as needed (wheezing and shortness of breath).          . gabapentin (NEURONTIN) 100 MG capsule   Oral   Take 200 mg by mouth 3 (three) times daily.         . hydrochlorothiazide (HYDRODIURIL) 25 MG tablet   Oral   Take 1 tablet (25 mg total) by mouth daily.   30 tablet   0   . lisinopril (PRINIVIL,ZESTRIL) 5 MG tablet   Oral   Take 5 mg by mouth daily.         .  meclizine (ANTIVERT) 25 MG tablet   Oral   Take 25 mg by mouth 3 (three) times daily as needed for dizziness.          . tacrolimus (PROTOPIC) 0.1 % ointment   Topical   Apply 1 application topically 2 (two) times daily as needed (Psoriasis).          . traMADol (ULTRAM) 50 MG tablet   Oral   Take 1 tablet (50 mg total) by mouth every 6 (six) hours as needed for pain.   15 tablet   0   . triamcinolone cream (KENALOG) 0.1 %   Topical   Apply 1 application topically 2 (two) times daily.          Marland Kitchen warfarin (COUMADIN) 5 MG tablet   Oral   Take 5 mg by mouth daily.          . clotrimazole (LOTRIMIN) 1 % cream      Apply to affected area  daily   15 g   0   . pramipexole (MIRAPEX) 0.125 MG tablet      Mirapex 0.125 mg: Take 1 pill each night for 1 week, the 2 pills each night for 1 week, then 3 pills each night thereafter. Common side effects reported are: Sedation, sleepiness, nausea, vomiting, and rare side effects are confusion, hallucinations, swelling in legs.          BP 161/84  Pulse 59  Temp(Src) 98.3 F (36.8 C) (Oral)  Resp 17  Ht 5\' 5"  (1.651 m)  Wt 240 lb (108.863 kg)  BMI 39.94 kg/m2  SpO2 97%  LMP 11/22/1996 Physical Exam  Nursing note and vitals reviewed. Constitutional: She is oriented to person, place, and time. She appears well-developed and well-nourished. No distress.  HENT:  Head: Normocephalic and atraumatic.  Mouth/Throat: Oropharynx is clear and moist.  Eyes: EOM are normal. Pupils are equal, round, and reactive to light.  Neck: Normal range of motion. Neck supple.  Cardiovascular: Normal rate and regular rhythm.   Pulmonary/Chest: Effort normal and breath sounds normal. No respiratory distress. She has no wheezes. She has no rales.  Abdominal: Soft. Bowel sounds are normal. She exhibits no  distension and no mass. There is no tenderness. There is no rebound and no guarding.  Genitourinary: Vagina normal.  No vaginal d/c. +  intertriginous erythematous rash in groin consistent with fungal infection.   Musculoskeletal: Normal range of motion. She exhibits no edema and no tenderness.  Neurological: She is alert and oriented to person, place, and time.  Skin: Skin is warm and dry. No rash noted. No erythema.  Psychiatric: She has a normal mood and affect. Her behavior is normal.    ED Course  Procedures (including critical care time) Labs Reviewed  URINALYSIS, ROUTINE W REFLEX MICROSCOPIC - Abnormal; Notable for the following:    Leukocytes, UA TRACE (*)    All other components within normal limits  URINE MICROSCOPIC-ADD ON - Abnormal; Notable for the following:    Squamous Epithelial / LPF FEW (*)    All other components within normal limits   No results found. 1. Candidal intertrigo     MDM  Benign exam. Normal VS. Will treat candidal skin infection. Return precautions given.   Loren Racer, MD 04/08/13 1537

## 2013-04-14 ENCOUNTER — Emergency Department (HOSPITAL_COMMUNITY)
Admission: EM | Admit: 2013-04-14 | Discharge: 2013-04-14 | Disposition: A | Discharge status: Home / Self Care | Payer: PRIVATE HEALTH INSURANCE | Attending: Emergency Medicine | Admitting: Emergency Medicine

## 2013-04-14 ENCOUNTER — Encounter (HOSPITAL_COMMUNITY): Payer: Self-pay | Admitting: Emergency Medicine

## 2013-04-14 DIAGNOSIS — Z86711 Personal history of pulmonary embolism: Secondary | ICD-10-CM | POA: Insufficient documentation

## 2013-04-14 DIAGNOSIS — Z862 Personal history of diseases of the blood and blood-forming organs and certain disorders involving the immune mechanism: Secondary | ICD-10-CM | POA: Insufficient documentation

## 2013-04-14 DIAGNOSIS — Z8701 Personal history of pneumonia (recurrent): Secondary | ICD-10-CM | POA: Insufficient documentation

## 2013-04-14 DIAGNOSIS — Z86718 Personal history of other venous thrombosis and embolism: Secondary | ICD-10-CM | POA: Insufficient documentation

## 2013-04-14 DIAGNOSIS — R209 Unspecified disturbances of skin sensation: Secondary | ICD-10-CM | POA: Insufficient documentation

## 2013-04-14 DIAGNOSIS — Z8639 Personal history of other endocrine, nutritional and metabolic disease: Secondary | ICD-10-CM | POA: Insufficient documentation

## 2013-04-14 DIAGNOSIS — G8929 Other chronic pain: Secondary | ICD-10-CM

## 2013-04-14 DIAGNOSIS — K219 Gastro-esophageal reflux disease without esophagitis: Secondary | ICD-10-CM | POA: Insufficient documentation

## 2013-04-14 DIAGNOSIS — Z8679 Personal history of other diseases of the circulatory system: Secondary | ICD-10-CM | POA: Insufficient documentation

## 2013-04-14 DIAGNOSIS — I10 Essential (primary) hypertension: Secondary | ICD-10-CM | POA: Insufficient documentation

## 2013-04-14 DIAGNOSIS — J45909 Unspecified asthma, uncomplicated: Secondary | ICD-10-CM | POA: Insufficient documentation

## 2013-04-14 DIAGNOSIS — Z8739 Personal history of other diseases of the musculoskeletal system and connective tissue: Secondary | ICD-10-CM | POA: Insufficient documentation

## 2013-04-14 DIAGNOSIS — R011 Cardiac murmur, unspecified: Secondary | ICD-10-CM | POA: Insufficient documentation

## 2013-04-14 DIAGNOSIS — Z8709 Personal history of other diseases of the respiratory system: Secondary | ICD-10-CM | POA: Insufficient documentation

## 2013-04-14 DIAGNOSIS — M545 Low back pain, unspecified: Secondary | ICD-10-CM | POA: Insufficient documentation

## 2013-04-14 DIAGNOSIS — Z79899 Other long term (current) drug therapy: Secondary | ICD-10-CM | POA: Insufficient documentation

## 2013-04-14 DIAGNOSIS — Z872 Personal history of diseases of the skin and subcutaneous tissue: Secondary | ICD-10-CM | POA: Insufficient documentation

## 2013-04-14 DIAGNOSIS — F411 Generalized anxiety disorder: Secondary | ICD-10-CM | POA: Insufficient documentation

## 2013-04-14 DIAGNOSIS — Z7901 Long term (current) use of anticoagulants: Secondary | ICD-10-CM | POA: Insufficient documentation

## 2013-04-14 DIAGNOSIS — Z8659 Personal history of other mental and behavioral disorders: Secondary | ICD-10-CM | POA: Insufficient documentation

## 2013-04-14 DIAGNOSIS — Z8669 Personal history of other diseases of the nervous system and sense organs: Secondary | ICD-10-CM | POA: Insufficient documentation

## 2013-04-14 MED ORDER — HYDROMORPHONE HCL PF 2 MG/ML IJ SOLN
2.0000 mg | Freq: Once | INTRAMUSCULAR | Status: AC
  Administered 2013-04-14: 2 mg via INTRAMUSCULAR
  Filled 2013-04-14: qty 1

## 2013-04-14 MED ORDER — ONDANSETRON 4 MG PO TBDP
8.0000 mg | ORAL_TABLET | Freq: Once | ORAL | Status: AC
  Administered 2013-04-14: 8 mg via ORAL
  Filled 2013-04-14: qty 2

## 2013-04-14 NOTE — ED Notes (Signed)
Pt brought to ED by EMS with chronic back pain and going down her legs.

## 2013-04-14 NOTE — ED Notes (Signed)
Report from [place], [place]

## 2013-04-14 NOTE — ED Provider Notes (Signed)
History    CSN: [id number] Arrival date & time 04/14/13  0620  First MD Initiated Contact with Patient 04/14/13 [phone]     Chief Complaint  Patient presents with  . Back Pain   (Consider location/radiation/quality/duration/timing/severity/associated sxs/prior Treatment) Patient is a 72 y.o. female presenting with back pain. The history is provided by the patient.  Back Pain She has chronic back pain and is status post lumbar fusion about 15 years ago. About 2 weeks ago, she was in the hospital and moved away that increased her chronic back pain. It is continued to bother her and has been getting worse. Pain radiates down both legs and there is numbness in both legs. She denies bowel or bladder dysfunction. She has been on muscle relaxer which is giving some relief. She is a chronic pain patient and does have hydrocodone-acetaminophen at home but she is restricted as far as the number that she can take and she states that she's not able to get her prescription filled for another several days. She is requesting an injection for pain while in the ED. Past Medical History  Diagnosis Date  . Depression   . Schizophrenia   . Hyperlipidemia   . Hypertension   . Chronic pain     "over my whole body" (03/30/2013)  . 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 DVT (deep venous thrombosis)     per 01/17/10 d/c summary- "remote hx of dvt"?  . Iron deficiency anemia   . Glaucoma   . Hemorrhoids   . Asthma   . Anxiety   . GERD (gastroesophageal reflux disease)   . Psoriasis 04/29/2012    New onset evaluated by Dr Marylou Flesher, Dermatology, [facility] 6/13  Rx 0.1% Tacrolimus ointment  . Complication of anesthesia     "because I have sleep apnea" (03/30/2013)  . Heart murmur   . Chest pain, exertional   . Chronic bronchitis   . Sleep apnea     "waiting on my CPAP" (03/30/2013)  . Pneumonia     "a few times" (03/30/2013)  . Exertional shortness of breath     "& sometimes when  laying down" (03/30/2013)  . Daily headache     "last 2 months" (03/30/2013)  . Arthritis     "joints" (03/30/2013)  . Varicose veins of legs    Past Surgical History  Procedure Laterality Date  . Spinal fusion      2004  . Carpal tunnel release Bilateral   . Tubal ligation  1973  . Dilation and curettage of uterus  1970's    "once" (03/30/2013)  . 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 Joint replacement     Family History  Problem Relation Age of Onset  . Diabetes Sister   . Colon cancer Brother 40   History  Substance Use Topics  . Smoking status: Never Smoker   . Smokeless tobacco: Never Used  . Alcohol Use: No   OB History   Grav Para Term Preterm Abortions TAB SAB Ect Mult Living                 Review of Systems  Musculoskeletal: Positive for back pain.  All other systems reviewed and are negative.    Allergies  Review of patient's allergies indicates no known allergies.  Home Medications   Current Outpatient Rx  Name  Route  Sig  Dispense  Refill  . albuterol (VENTOLIN HFA) 108 (90 BASE) MCG/ACT  inhaler   Inhalation   Inhale 2 puffs into the lungs 2 (two) times daily as needed for wheezing or shortness of breath.          Marland Kitchen atenolol (TENORMIN) 100 MG tablet   Oral   Take 100 mg by mouth daily.          . camphor-menthol (SARNA) lotion   Topical   Apply 1 application topically 2 (two) times daily.         . clotrimazole (LOTRIMIN) 1 % cream      Apply to affected area  daily   15 g   0   . doxycycline (VIBRA-TABS) 100 MG tablet   Oral   Take 1 tablet (100 mg total) by mouth every 12 (twelve) hours.   14 tablet   0   . esomeprazole (NEXIUM) 40 MG capsule   Oral   Take 40 mg by mouth daily before breakfast.          . FLUoxetine (PROZAC) 20 MG capsule   Oral   Take 60 mg by mouth daily.          . fluticasone (FLOVENT HFA) 220 MCG/ACT inhaler   Inhalation   Inhale 1 puff into the lungs 2 (two) times daily as needed (wheezing and  shortness of breath).          . gabapentin (NEURONTIN) 100 MG capsule   Oral   Take 200 mg by mouth 3 (three) times daily.         . hydrochlorothiazide (HYDRODIURIL) 25 MG tablet   Oral   Take 1 tablet (25 mg total) by mouth daily.   30 tablet   0   . lisinopril (PRINIVIL,ZESTRIL) 5 MG tablet   Oral   Take 5 mg by mouth daily.         . meclizine (ANTIVERT) 25 MG tablet   Oral   Take 25 mg by mouth 3 (three) times daily as needed for dizziness.          . pramipexole (MIRAPEX) 0.125 MG tablet      Mirapex 0.125 mg: Take 1 pill each night for 1 week, the 2 pills each night for 1 week, then 3 pills each night thereafter. Common side effects reported are: Sedation, sleepiness, nausea, vomiting, and rare side effects are confusion, hallucinations, swelling in legs.         . traMADol (ULTRAM) 50 MG tablet   Oral   Take 1 tablet (50 mg total) by mouth every 6 (six) hours as needed for pain.   15 tablet   0   . triamcinolone cream (KENALOG) 0.1 %   Topical   Apply 1 application topically 2 (two) times daily.          Marland Kitchen warfarin (COUMADIN) 5 MG tablet   Oral   Take 5 mg by mouth daily.           BP 175/95  Pulse 69  Temp(Src) 99.4 F (37.4 C) (Oral)  Resp 18  SpO2 96%  LMP 11/22/1996 Physical Exam  Nursing note and vitals reviewed.  72 year old female, resting comfortably and in no acute distress. Vital signs are significant for hypertension with blood pressure 175/95. Oxygen saturation is 96%, which is normal. Head is normocephalic and atraumatic. PERRLA, EOMI. Oropharynx is clear. Neck is nontender and supple without adenopathy or JVD. Back is moderately tender in the lumbar area with bilateral paralumbar spasm. Lungs are clear without rales, wheezes, or  rhonchi. Chest is nontender. Heart has regular rate and rhythm without murmur. Abdomen is soft, flat, nontender without masses or hepatosplenomegaly and peristalsis is normoactive. Extremities have  no cyanosis or edema, full range of motion is present. Skin is warm and dry without rash. Neurologic: Mental status is normal, cranial nerves are intact, there are no motor or sensory deficits.  ED Course  Procedures (including critical care time)  1. Acute exacerbation of chronic low back pain     MDM  Exacerbation of chronic low back pain. Old records are reviewed and she has multiple ED visits for back pain and chronic pain issues. She is given an injection of hydromorphone and referred back to her PCP.  Dione Booze, MD 04/14/13 [phone]

## 2013-04-14 NOTE — ED Notes (Signed)
Pt. Dressing for discharge

## 2013-04-16 ENCOUNTER — Ambulatory Visit (INDEPENDENT_AMBULATORY_CARE_PROVIDER_SITE_OTHER): Payer: 59 | Admitting: Neurology

## 2013-04-16 DIAGNOSIS — R0902 Hypoxemia: Secondary | ICD-10-CM

## 2013-04-16 DIAGNOSIS — G4730 Sleep apnea, unspecified: Secondary | ICD-10-CM

## 2013-04-16 DIAGNOSIS — G4733 Obstructive sleep apnea (adult) (pediatric): Secondary | ICD-10-CM

## 2013-04-16 DIAGNOSIS — G2581 Restless legs syndrome: Secondary | ICD-10-CM

## 2013-04-29 ENCOUNTER — Encounter: Payer: PRIVATE HEALTH INSURANCE | Admitting: Obstetrics & Gynecology

## 2013-05-01 ENCOUNTER — Telehealth: Payer: Self-pay | Admitting: Neurology

## 2013-05-01 DIAGNOSIS — G4733 Obstructive sleep apnea (adult) (pediatric): Secondary | ICD-10-CM

## 2013-05-01 NOTE — Telephone Encounter (Signed)
Please call and notify patient that the recent sleep study confirmed the diagnosis of OSA. She did well with CPAP during the study with significant improvement of the respiratory events. Therefore, I would like start the patient on CPAP at home. I placed the order in the chart.   Arrange for CPAP set up at home through a DME company of patient's choice and fax/route report to PCP and referring MD (if other than PCP).   The patient will also need a follow up appointment with me in 6-8 weeks post set up that has to be scheduled; help the patient schedule this (in a follow-up slot).   Please re-enforce the importance of compliance with treatment and the need for us to monitor compliance data.   Once you have spoken to the patient and scheduled the return appointment, you may close this encounter, thanks,   Ayako Tapanes, MD, PhD [facility] ([facility]    

## 2013-05-04 ENCOUNTER — Emergency Department (HOSPITAL_COMMUNITY)
Admission: EM | Admit: 2013-05-04 | Discharge: 2013-05-04 | Disposition: A | Discharge status: Home / Self Care | Payer: PRIVATE HEALTH INSURANCE | Attending: Emergency Medicine | Admitting: Emergency Medicine

## 2013-05-04 ENCOUNTER — Encounter (HOSPITAL_COMMUNITY): Payer: Self-pay | Admitting: Neurology

## 2013-05-04 DIAGNOSIS — D509 Iron deficiency anemia, unspecified: Secondary | ICD-10-CM | POA: Insufficient documentation

## 2013-05-04 DIAGNOSIS — J45909 Unspecified asthma, uncomplicated: Secondary | ICD-10-CM | POA: Insufficient documentation

## 2013-05-04 DIAGNOSIS — I8390 Asymptomatic varicose veins of unspecified lower extremity: Secondary | ICD-10-CM | POA: Insufficient documentation

## 2013-05-04 DIAGNOSIS — H409 Unspecified glaucoma: Secondary | ICD-10-CM | POA: Insufficient documentation

## 2013-05-04 DIAGNOSIS — F3289 Other specified depressive episodes: Secondary | ICD-10-CM | POA: Insufficient documentation

## 2013-05-04 DIAGNOSIS — M542 Cervicalgia: Secondary | ICD-10-CM | POA: Insufficient documentation

## 2013-05-04 DIAGNOSIS — G8929 Other chronic pain: Secondary | ICD-10-CM | POA: Insufficient documentation

## 2013-05-04 DIAGNOSIS — K219 Gastro-esophageal reflux disease without esophagitis: Secondary | ICD-10-CM | POA: Insufficient documentation

## 2013-05-04 DIAGNOSIS — I10 Essential (primary) hypertension: Secondary | ICD-10-CM | POA: Insufficient documentation

## 2013-05-04 DIAGNOSIS — Z8669 Personal history of other diseases of the nervous system and sense organs: Secondary | ICD-10-CM | POA: Insufficient documentation

## 2013-05-04 DIAGNOSIS — Z8719 Personal history of other diseases of the digestive system: Secondary | ICD-10-CM | POA: Insufficient documentation

## 2013-05-04 DIAGNOSIS — Z86711 Personal history of pulmonary embolism: Secondary | ICD-10-CM | POA: Insufficient documentation

## 2013-05-04 DIAGNOSIS — R51 Headache: Secondary | ICD-10-CM | POA: Insufficient documentation

## 2013-05-04 DIAGNOSIS — R110 Nausea: Secondary | ICD-10-CM | POA: Insufficient documentation

## 2013-05-04 DIAGNOSIS — R42 Dizziness and giddiness: Secondary | ICD-10-CM | POA: Insufficient documentation

## 2013-05-04 DIAGNOSIS — F329 Major depressive disorder, single episode, unspecified: Secondary | ICD-10-CM | POA: Insufficient documentation

## 2013-05-04 DIAGNOSIS — F209 Schizophrenia, unspecified: Secondary | ICD-10-CM | POA: Insufficient documentation

## 2013-05-04 DIAGNOSIS — Z86718 Personal history of other venous thrombosis and embolism: Secondary | ICD-10-CM | POA: Insufficient documentation

## 2013-05-04 DIAGNOSIS — E785 Hyperlipidemia, unspecified: Secondary | ICD-10-CM | POA: Insufficient documentation

## 2013-05-04 DIAGNOSIS — Z79899 Other long term (current) drug therapy: Secondary | ICD-10-CM | POA: Insufficient documentation

## 2013-05-04 DIAGNOSIS — R011 Cardiac murmur, unspecified: Secondary | ICD-10-CM | POA: Insufficient documentation

## 2013-05-04 DIAGNOSIS — Z7901 Long term (current) use of anticoagulants: Secondary | ICD-10-CM | POA: Insufficient documentation

## 2013-05-04 DIAGNOSIS — M129 Arthropathy, unspecified: Secondary | ICD-10-CM | POA: Insufficient documentation

## 2013-05-04 DIAGNOSIS — R5381 Other malaise: Secondary | ICD-10-CM | POA: Insufficient documentation

## 2013-05-04 DIAGNOSIS — L408 Other psoriasis: Secondary | ICD-10-CM | POA: Insufficient documentation

## 2013-05-04 DIAGNOSIS — Z8701 Personal history of pneumonia (recurrent): Secondary | ICD-10-CM | POA: Insufficient documentation

## 2013-05-04 DIAGNOSIS — G4730 Sleep apnea, unspecified: Secondary | ICD-10-CM | POA: Insufficient documentation

## 2013-05-04 DIAGNOSIS — F411 Generalized anxiety disorder: Secondary | ICD-10-CM | POA: Insufficient documentation

## 2013-05-04 LAB — CBC WITH DIFFERENTIAL/PLATELET
Basophils Absolute: 0 10*3/uL (ref 0.0–0.1)
Basophils Relative: 0 % (ref 0–1)
Eosinophils Absolute: 0 10*3/uL (ref 0.0–0.7)
Eosinophils Relative: 0 % (ref 0–5)
HCT: 35.8 % — ABNORMAL LOW (ref 36.0–46.0)
Hemoglobin: 12.4 g/dL (ref 12.0–15.0)
MCH: 32.7 pg (ref 26.0–34.0)
MCHC: 34.6 g/dL (ref 30.0–36.0)
Monocytes Absolute: 0.1 10*3/uL (ref 0.1–1.0)
Neutro Abs: 6.9 10*3/uL (ref 1.7–7.7)
RBC: 3.79 MIL/uL — ABNORMAL LOW (ref 3.87–5.11)
RDW: 14.9 % (ref 11.5–15.5)
WBC: 7.7 10*3/uL (ref 4.0–10.5)

## 2013-05-04 LAB — URINALYSIS, ROUTINE W REFLEX MICROSCOPIC
Ketones, ur: NEGATIVE mg/dL
Leukocytes, UA: NEGATIVE
Nitrite: NEGATIVE
Protein, ur: NEGATIVE mg/dL

## 2013-05-04 LAB — BASIC METABOLIC PANEL
Calcium: 10 mg/dL (ref 8.4–10.5)
Chloride: 98 mEq/L (ref 96–112)
Creatinine, Ser: 0.77 mg/dL (ref 0.50–1.10)
GFR calc Af Amer: >90 mL/min (ref >90)
GFR calc non Af Amer: 83 mL/min — ABNORMAL LOW (ref >90)

## 2013-05-04 MED ORDER — KETOROLAC TROMETHAMINE 30 MG/ML IJ SOLN
30.0000 mg | Freq: Once | INTRAMUSCULAR | Status: AC
  Administered 2013-05-04: 30 mg via INTRAVENOUS
  Filled 2013-05-04: qty 1

## 2013-05-04 MED ORDER — MECLIZINE HCL 25 MG PO TABS
25.0000 mg | ORAL_TABLET | Freq: Once | ORAL | Status: AC
  Administered 2013-05-04: 25 mg via ORAL
  Filled 2013-05-04: qty 1

## 2013-05-04 MED ORDER — ONDANSETRON HCL 4 MG PO TABS: 4.0000 mg | ORAL_TABLET | Freq: Four times a day (QID) | ORAL | Status: DC

## 2013-05-04 MED ORDER — METOCLOPRAMIDE HCL 5 MG/ML IJ SOLN
10.0000 mg | Freq: Once | INTRAMUSCULAR | Status: AC
  Administered 2013-05-04: 10 mg via INTRAMUSCULAR
  Filled 2013-05-04: qty 2

## 2013-05-04 NOTE — ED Notes (Signed)
Per EMS- Pt comes from home c/o vertigo since Friday got worse today. Also c/o headache, has pinched nerves she had been frequently mentioning which is why C-collar in place. Is taking meds for vertigo but is not working today. BP 160/98, HR80. No neuro deficits. Ambulates independently.

## 2013-05-04 NOTE — ED Provider Notes (Signed)
CSN: [id number]     Arrival date & time 05/04/13  1017 History     First MD Initiated Contact with Patient 05/04/13 1024     Chief Complaint  Patient presents with  . Dizziness   (Consider location/radiation/quality/duration/timing/severity/associated sxs/prior Treatment) HPI Comments: Patient is a 72 y/o female with an extensive PMH including depression, schizophrenia, chronic pain, vertigo, and daily headaches who presents for worsening vertigo x 3 days. Patient states she takes meclizine for her vertigo which usually relieves her symptoms; however meclizine has recently been providing her no relief. Patient endorses dizziness with associated generalized headache without thunderclap onset and nausea. Patient states symptoms are aggravated when she is unable to sleep; states she has not been able to sleep at all over the past 3 nights. Patient denies fever, syncope, vomiting, hematuria, and an inability to ambulate. Further denies hitting head and recent falls or trauma. Denies SI/HI.  The history is provided by the patient. No language interpreter was used.   Past Medical History  Diagnosis Date  . Depression   . Schizophrenia   . Hyperlipidemia   . Hypertension   . Chronic pain     "over my whole body" (03/30/2013)  . 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 DVT (deep venous thrombosis)     per 01/17/10 d/c summary- "remote hx of dvt"?  . Iron deficiency anemia   . Glaucoma   . Hemorrhoids   . Asthma   . Anxiety   . GERD (gastroesophageal reflux disease)   . Psoriasis 04/29/2012    New onset evaluated by Dr Marylou Flesher, Dermatology, [facility] 6/13  Rx 0.1% Tacrolimus ointment  . Complication of anesthesia     "because I have sleep apnea" (03/30/2013)  . Heart murmur   . Chest pain, exertional   . Chronic bronchitis   . Sleep apnea     "waiting on my CPAP" (03/30/2013)  . Pneumonia     "a few times" (03/30/2013)  . Exertional shortness of breath     "&  sometimes when laying down" (03/30/2013)  . Daily headache     "last 2 months" (03/30/2013)  . Arthritis     "joints" (03/30/2013)  . Varicose veins of legs    Past Surgical History  Procedure Laterality Date  . Spinal fusion      2004  . Carpal tunnel release Bilateral   . Tubal ligation  1973  . Dilation and curettage of uterus  1970's    "once" (03/30/2013)  . 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 Joint replacement     Family History  Problem Relation Age of Onset  . Diabetes Sister   . Colon cancer Brother 40   History  Substance Use Topics  . Smoking status: Never Smoker   . Smokeless tobacco: Never Used  . Alcohol Use: No   OB History   Grav Para Term Preterm Abortions TAB SAB Ect Mult Living                 Review of Systems  Constitutional: Positive for fatigue. Negative for fever.  HENT: Positive for neck pain (chronic).   Respiratory: Negative for cough.   Gastrointestinal: Positive for nausea. Negative for vomiting.  Genitourinary: Negative for hematuria.  Musculoskeletal: Positive for arthralgias (chronic).  Skin: Negative for rash.  Neurological: Positive for dizziness and headaches. Negative for syncope.    Allergies  Review of patient's allergies indicates no known allergies.  Home  Medications   Current Outpatient Rx  Name  Route  Sig  Dispense  Refill  . albuterol (VENTOLIN HFA) 108 (90 BASE) MCG/ACT inhaler   Inhalation   Inhale 2 puffs into the lungs 2 (two) times daily as needed for wheezing or shortness of breath.          Marland Kitchen atenolol (TENORMIN) 100 MG tablet   Oral   Take 100 mg by mouth daily.          . camphor-menthol (SARNA) lotion   Topical   Apply 1 application topically 2 (two) times daily.         . clotrimazole (LOTRIMIN) 1 % cream      Apply to affected area  daily   15 g   0   . esomeprazole (NEXIUM) 40 MG capsule   Oral   Take 40 mg by mouth daily before breakfast.          . FLUoxetine (PROZAC) 20 MG  capsule   Oral   Take 60 mg by mouth daily.          . fluticasone (FLOVENT HFA) 220 MCG/ACT inhaler   Inhalation   Inhale 1 puff into the lungs 2 (two) times daily.          Marland Kitchen gabapentin (NEURONTIN) 100 MG capsule   Oral   Take 200 mg by mouth 3 (three) times daily.         . hydrochlorothiazide (HYDRODIURIL) 25 MG tablet   Oral   Take 1 tablet (25 mg total) by mouth daily.   30 tablet   0   . hydrOXYzine (ATARAX/VISTARIL) 25 MG tablet   Oral   Take 25 mg by mouth 3 (three) times daily as needed for itching.         Marland Kitchen lisinopril (PRINIVIL,ZESTRIL) 5 MG tablet   Oral   Take 5 mg by mouth daily.         . meclizine (ANTIVERT) 25 MG tablet   Oral   Take 25 mg by mouth 3 (three) times daily as needed for dizziness.          . pramipexole (MIRAPEX) 0.125 MG tablet      Mirapex 0.125 mg: Take 1 pill each night for 1 week, the 2 pills each night for 1 week, then 3 pills each night thereafter. Common side effects reported are: Sedation, sleepiness, nausea, vomiting, and rare side effects are confusion, hallucinations, swelling in legs.         . predniSONE (DELTASONE) 10 MG tablet   Oral   Take 10-40 mg by mouth daily. Taper dose . 4 tabs for 2 days, 3 tab for 2 days, 2 tabs for 2 days, 1 tab for 2 days         . PRESCRIPTION MEDICATION   Topical   Apply 1 application topically 2 (two) times daily. Silver sulfadi/ triamcinolone cream= compounded by [facility]         . tacrolimus (PROTOPIC) 0.1 % ointment   Topical   Apply 1 application topically 2 (two) times daily.         Marland Kitchen tiZANidine (ZANAFLEX) 2 MG tablet   Oral   Take 2 mg by mouth daily as needed (for muscle spasms).          . traMADol (ULTRAM) 50 MG tablet   Oral   Take 1 tablet (50 mg total) by mouth every 6 (six) hours as needed for pain.   15 tablet  0   . triamcinolone cream (KENALOG) 0.1 %   Topical   Apply 1 application topically 2 (two) times daily.          Marland Kitchen warfarin (COUMADIN)  5 MG tablet   Oral   Take 5 mg by mouth daily.           BP 160/74  Pulse 74  Temp(Src) 98.4 F (36.9 C) (Oral)  Resp 16  SpO2 100%  LMP 11/22/1996  Physical Exam  Nursing note and vitals reviewed. Constitutional: She appears well-developed and well-nourished. No distress.  HENT:  Head: Normocephalic and atraumatic.  Right Ear: Tympanic membrane, external ear and ear canal normal.  Left Ear: Tympanic membrane, external ear and ear canal normal.  Mouth/Throat: Oropharynx is clear and moist. No oropharyngeal exudate.  Symmetric rise of the uvula with phonation  Eyes: Conjunctivae and EOM are normal. Pupils are equal, round, and reactive to light. No scleral icterus.  Cardiovascular: Normal rate, regular rhythm and normal heart sounds.   Distal radial, dorsalis pedis, posterior tibial pulses 2+ bilaterally  Pulmonary/Chest: Effort normal and breath sounds normal. No respiratory distress. She has no wheezes. She has no rales.  Abdominal: Soft. She exhibits no mass. There is no tenderness. There is no rebound and no guarding.  Neurological:  Patient speaks in full goal oriented sentences. Cranial nerves III through XII grossly intact. Patient exhibits equal grip strength bilaterally with bilateral 5 strength against resistance in her upper and lower extremities. DTRs normal and symmetric. No sensory or motor deficits appreciated.  Skin: Skin is warm and dry. No rash noted. She is not diaphoretic. No erythema. No pallor.  Psychiatric: She has a normal mood and affect. Her behavior is normal.   ED Course   Procedures (including critical care time)  Labs Reviewed  CBC WITH DIFFERENTIAL - Abnormal; Notable for the following:    RBC 3.79 (*)    HCT 35.8 (*)    Neutrophils Relative % 90 (*)    Lymphocytes Relative 8 (*)    Lymphs Abs 0.6 (*)    Monocytes Relative 2 (*)    All other components within normal limits  BASIC METABOLIC PANEL - Abnormal; Notable for the following:     Glucose, Bld 137 (*)    GFR calc non Af Amer 83 (*)    All other components within normal limits  PROTIME-INR - Abnormal; Notable for the following:    Prothrombin Time 19.5 (*)    INR 1.70 (*)    All other components within normal limits  URINALYSIS, ROUTINE W REFLEX MICROSCOPIC   No results found.  Clinical Data: Dizziness  CT HEAD WITHOUT CONTRAST 03/18/2013 Technique: Contiguous axial images were obtained from the base of  the skull through the vertex without contrast.  Comparison: 01/21/2013 and multiple previous  Findings: The brain has a normal appearance without evidence of old  or acute infarction, mass lesion, hemorrhage, hydrocephalus or  extra-axial collection. The patient does have atherosclerotic  calcification of the major vessels at the base of the brain. The  calvarium is unremarkable. Visualized sinuses, middle ears and  mastoids are clear. No skull or skull base lesion seen.  IMPRESSION:  No brain abnormality. Atherosclerosis.  1. Dizziness   2. Headache    MDM  Patient well known to ED presents for worsening vertigo x 3 days with generalized headache and nausea. Patient neurovascularly intact without focal deficits; moves extremities without ataxia. W/u to include CBC, BMP, PT/INR, and UA. Toradol and  Reglan ordered for symptoms.  1:00 - Patient's labs without leukocytosis, anemia, or electrolyte imbalance. Kidney function normal and UA without infection or hematuria. INR is subtherapeutic to 1.7. Have reviewed prior imaging including CT angio from 03/2013 and CT head from 02/2013 both of which were negative for acute processes.   1:30 - Patient states headache and nausea have completely resolved. Patient originally endorsed taking meclizine at 10AM; now stating last dose was at 1AM. Meclizine ordered for symptoms as last dose was ~12 hours ago. Patient has been up to the commode to void during ED stay. Patient appropriate for d/c with PCP follow up. Indications for  return discussed and patient agreeable to plan. Patient seen also by Dr. Rubin Payor who is in agreement with this work up, assessment, management plan, and patient's stability for d/c.     Antony Madura, PA-C 05/09/13 1247

## 2013-05-05 ENCOUNTER — Telehealth: Payer: Self-pay | Admitting: *Deleted

## 2013-05-05 ENCOUNTER — Ambulatory Visit: Payer: Medicare Other | Admitting: Oncology

## 2013-05-05 ENCOUNTER — Ambulatory Visit: Payer: PRIVATE HEALTH INSURANCE

## 2013-05-05 ENCOUNTER — Other Ambulatory Visit: Payer: Medicare Other | Admitting: Lab

## 2013-05-05 NOTE — Telephone Encounter (Signed)
Received vm call from our pharmacist, Gearldine Bienenstock stating that pt had called her & informed that she was not feeling well & cancelled coumadin clinic & Dr Patsy Lager visit today & will need to be r/s.  Note to Dr Cyndie Chime.

## 2013-05-06 ENCOUNTER — Other Ambulatory Visit: Payer: Self-pay | Admitting: Oncology

## 2013-05-06 DIAGNOSIS — Z7901 Long term (current) use of anticoagulants: Secondary | ICD-10-CM

## 2013-05-06 DIAGNOSIS — I2699 Other pulmonary embolism without acute cor pulmonale: Secondary | ICD-10-CM

## 2013-05-07 ENCOUNTER — Encounter: Payer: Self-pay | Admitting: *Deleted

## 2013-05-07 ENCOUNTER — Telehealth: Payer: Self-pay | Admitting: Oncology

## 2013-05-07 NOTE — Telephone Encounter (Signed)
Called patient to discuss sleep study results.  Discussed findings, recommendations and follow up care.  Patient understood well and all questions were answered.   A copy of the sleep study interpretive report as well as a letter with info regarding contact info for the DME company, the importance of CPAP compliance, and the date of the follow up appointment info will be mailed to the patient's home.  [facility] for DME

## 2013-05-07 NOTE — Telephone Encounter (Signed)
S.W. PT AND ADVISED ON 8.19 AND 9.16. APPT...PT OK AND AWRE

## 2013-05-10 ENCOUNTER — Encounter (HOSPITAL_COMMUNITY): Payer: Self-pay | Admitting: Nurse Practitioner

## 2013-05-10 ENCOUNTER — Emergency Department (HOSPITAL_COMMUNITY)
Admission: EM | Admit: 2013-05-10 | Discharge: 2013-05-10 | Disposition: A | Discharge status: Home / Self Care | Payer: PRIVATE HEALTH INSURANCE | Attending: Emergency Medicine | Admitting: Emergency Medicine

## 2013-05-10 DIAGNOSIS — I8390 Asymptomatic varicose veins of unspecified lower extremity: Secondary | ICD-10-CM | POA: Insufficient documentation

## 2013-05-10 DIAGNOSIS — J45909 Unspecified asthma, uncomplicated: Secondary | ICD-10-CM | POA: Insufficient documentation

## 2013-05-10 DIAGNOSIS — F209 Schizophrenia, unspecified: Secondary | ICD-10-CM | POA: Insufficient documentation

## 2013-05-10 DIAGNOSIS — IMO0002 Reserved for concepts with insufficient information to code with codable children: Secondary | ICD-10-CM | POA: Insufficient documentation

## 2013-05-10 DIAGNOSIS — I10 Essential (primary) hypertension: Secondary | ICD-10-CM | POA: Insufficient documentation

## 2013-05-10 DIAGNOSIS — K219 Gastro-esophageal reflux disease without esophagitis: Secondary | ICD-10-CM | POA: Insufficient documentation

## 2013-05-10 DIAGNOSIS — Z86711 Personal history of pulmonary embolism: Secondary | ICD-10-CM | POA: Insufficient documentation

## 2013-05-10 DIAGNOSIS — M129 Arthropathy, unspecified: Secondary | ICD-10-CM | POA: Insufficient documentation

## 2013-05-10 DIAGNOSIS — Z7901 Long term (current) use of anticoagulants: Secondary | ICD-10-CM | POA: Insufficient documentation

## 2013-05-10 DIAGNOSIS — Z79899 Other long term (current) drug therapy: Secondary | ICD-10-CM | POA: Insufficient documentation

## 2013-05-10 DIAGNOSIS — F329 Major depressive disorder, single episode, unspecified: Secondary | ICD-10-CM | POA: Insufficient documentation

## 2013-05-10 DIAGNOSIS — G8929 Other chronic pain: Secondary | ICD-10-CM | POA: Insufficient documentation

## 2013-05-10 DIAGNOSIS — L408 Other psoriasis: Secondary | ICD-10-CM | POA: Insufficient documentation

## 2013-05-10 DIAGNOSIS — Z86718 Personal history of other venous thrombosis and embolism: Secondary | ICD-10-CM | POA: Insufficient documentation

## 2013-05-10 DIAGNOSIS — F411 Generalized anxiety disorder: Secondary | ICD-10-CM | POA: Insufficient documentation

## 2013-05-10 DIAGNOSIS — G4733 Obstructive sleep apnea (adult) (pediatric): Secondary | ICD-10-CM | POA: Insufficient documentation

## 2013-05-10 DIAGNOSIS — H409 Unspecified glaucoma: Secondary | ICD-10-CM | POA: Insufficient documentation

## 2013-05-10 DIAGNOSIS — F3289 Other specified depressive episodes: Secondary | ICD-10-CM | POA: Insufficient documentation

## 2013-05-10 DIAGNOSIS — E785 Hyperlipidemia, unspecified: Secondary | ICD-10-CM | POA: Insufficient documentation

## 2013-05-10 LAB — URINALYSIS, ROUTINE W REFLEX MICROSCOPIC
Bilirubin Urine: NEGATIVE
Glucose, UA: NEGATIVE mg/dL
Ketones, ur: NEGATIVE mg/dL
Specific Gravity, Urine: 1.017 (ref 1.005–1.030)
pH: 8.5 — ABNORMAL HIGH (ref 5.0–8.0)

## 2013-05-10 MED ORDER — HYDROCODONE-ACETAMINOPHEN 5-325 MG PO TABS: 1.0000 | ORAL_TABLET | Freq: Three times a day (TID) | ORAL | Status: DC | PRN

## 2013-05-10 MED ORDER — METHOCARBAMOL 500 MG PO TABS: 500.0000 mg | ORAL_TABLET | Freq: Two times a day (BID) | ORAL | Status: DC

## 2013-05-10 MED ORDER — METHOCARBAMOL 500 MG PO TABS
500.0000 mg | ORAL_TABLET | Freq: Once | ORAL | Status: AC
  Administered 2013-05-10: 500 mg via ORAL
  Filled 2013-05-10: qty 1

## 2013-05-10 MED ORDER — TRAMADOL HCL 50 MG PO TABS
50.0000 mg | ORAL_TABLET | Freq: Once | ORAL | Status: AC
  Administered 2013-05-10: 50 mg via ORAL
  Filled 2013-05-10: qty 1

## 2013-05-10 NOTE — ED Provider Notes (Signed)
This chart was scribed for [name] [name], a non-physician practitioner working with No att. providers found by Lewanda Rife, ED Scribe. This patient was seen in room TR05C/TR05C and the patient's care was started at 1749.    CSN: [id number]     Arrival date & time 05/10/13  1453 History     First MD Initiated Contact with Patient 05/10/13 1622     Chief Complaint  Patient presents with  . Back Pain   (Consider location/radiation/quality/duration/timing/severity/associated sxs/prior Treatment) The history is provided by the patient.  HPI Comments: [name] [name] is a 72 y.o. female brought in by ambulance, who presents to the Emergency Department complaining of constant worsening chronic low back pain radiating to neck onset 15 years. Reports associated neck stiffness. Denied new symptoms to back pain. Reports ambulation is unchanged. Reports taking prescribed gabapentin with no relief of symptoms. Reports she has an appointment with back specialist September 8th at [facility] regarding back pain. Reports hx of vertigo.Denies associated recent injuries, fall, chest pain, head injury, difficulty breathing, urinary or bowel incontinence, fever, atypical dizziness, nausea, change in appetite, and difficulty swallowing, fever.     Past Medical History  Diagnosis Date  . Depression   . Schizophrenia   . Hyperlipidemia   . Hypertension   . Chronic pain     "over my whole body" (03/30/2013)  . 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 DVT (deep venous thrombosis)     per 01/17/10 d/c summary- "remote hx of dvt"?  . Iron deficiency anemia   . Glaucoma   . Hemorrhoids   . Asthma   . Anxiety   . GERD (gastroesophageal reflux disease)   . Psoriasis 04/29/2012    New onset evaluated by Dr Marylou Flesher, Dermatology, [facility] 6/13  Rx 0.1% Tacrolimus ointment  . Complication of anesthesia     "because I have sleep apnea" (03/30/2013)  . Heart murmur   . Chest  pain, exertional   . Chronic bronchitis   . Sleep apnea     "waiting on my CPAP" (03/30/2013)  . Pneumonia     "a few times" (03/30/2013)  . Exertional shortness of breath     "& sometimes when laying down" (03/30/2013)  . Daily headache     "last 2 months" (03/30/2013)  . Arthritis     "joints" (03/30/2013)  . Varicose veins of legs    Past Surgical History  Procedure Laterality Date  . Spinal fusion      2004  . Carpal tunnel release Bilateral   . Tubal ligation  1973  . Dilation and curettage of uterus  1970's    "once" (03/30/2013)  . 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 Joint replacement     Family History  Problem Relation Age of Onset  . Diabetes Sister   . Colon cancer Brother 40   History  Substance Use Topics  . Smoking status: Never Smoker   . Smokeless tobacco: Never Used  . Alcohol Use: No   OB History   Grav Para Term Preterm Abortions TAB SAB Ect Mult Living                 Review of Systems  Constitutional: Negative for fever.  Musculoskeletal: Positive for back pain.   A complete 10 system review of systems was obtained and all systems are negative except as noted in the HPI and PMH.    Allergies  Review of  patient's allergies indicates no known allergies.  Home Medications   Current Outpatient Rx  Name  Route  Sig  Dispense  Refill  . albuterol (VENTOLIN HFA) 108 (90 BASE) MCG/ACT inhaler   Inhalation   Inhale 2 puffs into the lungs 2 (two) times daily as needed for wheezing or shortness of breath.          Marland Kitchen atenolol (TENORMIN) 100 MG tablet   Oral   Take 100 mg by mouth daily.          . camphor-menthol (SARNA) lotion   Topical   Apply 1 application topically 2 (two) times daily.         Marland Kitchen esomeprazole (NEXIUM) 40 MG capsule   Oral   Take 40 mg by mouth daily before breakfast.          . FLUoxetine (PROZAC) 20 MG capsule   Oral   Take 60 mg by mouth daily.          . fluticasone (FLOVENT HFA) 220 MCG/ACT inhaler    Inhalation   Inhale 1 puff into the lungs 2 (two) times daily.          Marland Kitchen gabapentin (NEURONTIN) 100 MG capsule   Oral   Take 200 mg by mouth 3 (three) times daily.         . hydrochlorothiazide (HYDRODIURIL) 25 MG tablet   Oral   Take 1 tablet (25 mg total) by mouth daily.   30 tablet   0   . hydrOXYzine (ATARAX/VISTARIL) 25 MG tablet   Oral   Take 25 mg by mouth 3 (three) times daily as needed for itching.         Marland Kitchen lisinopril (PRINIVIL,ZESTRIL) 5 MG tablet   Oral   Take 5 mg by mouth daily.         . meclizine (ANTIVERT) 25 MG tablet   Oral   Take 25 mg by mouth 3 (three) times daily as needed for dizziness.          . ondansetron (ZOFRAN) 4 MG tablet   Oral   Take 1 tablet (4 mg total) by mouth every 6 (six) hours. As needed for nausea   12 tablet   0   . predniSONE (DELTASONE) 10 MG tablet   Oral   Take 10-40 mg by mouth daily. Taper dose . 4 tabs for 2 days, 3 tab for 2 days, 2 tabs for 2 days, 1 tab for 2 days         . PRESCRIPTION MEDICATION   Topical   Apply 1 application topically 2 (two) times daily. Silver sulfadi/ triamcinolone cream= [facility]ounded by [facility]         . tacrolimus (PROTOPIC) 0.1 % ointment   Topical   Apply 1 application topically 2 (two) times daily.         Marland Kitchen tiZANidine (ZANAFLEX) 2 MG tablet   Oral   Take 2 mg by mouth daily as needed (for muscle spasms).          . traMADol (ULTRAM) 50 MG tablet   Oral   Take 1 tablet (50 mg total) by mouth every 6 (six) hours as needed for pain.   15 tablet   0   . triamcinolone cream (KENALOG) 0.1 %   Topical   Apply 1 application topically 2 (two) times daily.          Marland Kitchen warfarin (COUMADIN) 5 MG tablet   Oral   Take 5  mg by mouth daily.          Marland Kitchen HYDROcodone-acetaminophen (NORCO) 5-325 MG per tablet   Oral   Take 1 tablet by mouth every 8 (eight) hours as needed for pain.   11 tablet   0   . methocarbamol (ROBAXIN) 500 MG tablet   Oral   Take 1 tablet (500 mg  total) by mouth 2 (two) times daily.   20 tablet   0   . pramipexole (MIRAPEX) 0.125 MG tablet      Mirapex 0.125 mg: Take 1 pill each night for 1 week, the 2 pills each night for 1 week, then 3 pills each night thereafter. Common side effects reported are: Sedation, sleepiness, nausea, vomiting, and rare side effects are confusion, hallucinations, swelling in legs.          BP 155/83  Pulse 60  Temp(Src) 99.3 F (37.4 C) (Oral)  Resp 16  SpO2 98%  LMP 11/22/1996 Physical Exam  Nursing note and vitals reviewed. Constitutional: She is oriented to person, place, and time. She appears well-developed and well-nourished. No distress.  C-collar for comfort - patient reported that she wears this all the time for aid in her neck   HENT:  Head: Normocephalic and atraumatic.  Mouth/Throat: Oropharynx is clear and moist. No oropharyngeal exudate.  Negative trismus  Eyes: Conjunctivae and EOM are normal. Pupils are equal, round, and reactive to light. Right eye exhibits no discharge. Left eye exhibits no discharge.  Neck: Normal range of motion. Neck supple. No tracheal deviation present.  Pain upon palpation to the neck - muscular in nature Negative mid-cervical spine tenderness  Cardiovascular: Normal rate, regular rhythm and normal heart sounds.  Exam reveals no friction rub.   No murmur heard. Pulses:      Radial pulses are 2+ on the right side, and 2+ on the left side.       Dorsalis pedis pulses are 2+ on the right side, and 2+ on the left side.  Varicose veins  noted to bilateral lower extremities  Pulmonary/Chest: Effort normal and breath sounds normal. No respiratory distress. She has no wheezes. She has no rales.  Musculoskeletal: Normal range of motion. She exhibits tenderness.  Lumbosacral region muscular tenderness noted  Lymphadenopathy:    She has no cervical adenopathy.  Neurological: She is alert and oriented to person, place, and time. She exhibits normal muscle tone.  Coordination normal.  Sensation intact Cranial nerves II through XII grossly Strength 5+/5+ upper and lower tremors bilaterally Gait proper, stable and balanced. Patient walked down the hallway without any difficulty, negative limp. Walk was straight and balanced. Negative hunch when walking, patient had back straight. Negative shuffling.   Skin: Skin is warm and dry.  Psychiatric: She has a normal mood and affect. Her behavior is normal. Thought content normal.    ED Course   Procedures (including critical care time) Medications  traMADol (ULTRAM) tablet 50 mg (50 mg Oral Given 05/10/13 1800)  methocarbamol (ROBAXIN) tablet 500 mg (500 mg Oral Given 05/10/13 1800)    Labs Reviewed  URINALYSIS, ROUTINE W REFLEX MICROSCOPIC - Abnormal; Notable for the following:    pH 8.5 (*)    All other components within normal limits   No results found. 1. Acute exacerbation of chronic low back pain     MDM  I personally performed the services described in this documentation, which was scribed in my presence. The recorded information has been reviewed and is accurate.  Patient  presenting to the emergency department with low back pain that has been ongoing for the past 15 years. Patient currently has appointment with back specialist on 06/01/2013 at [facility]. Denied new symptoms. Denied injuries, falls, urinary and bowel incontinence. Alert and oriented. Full range of motion to upper and lower extremities bilaterally with strength completely intact. Pulses palpable. Patient able to get in and out of wheelchair without difficulty. Patient able to stand and walk the halls of the ED without difficulty - proper balance. Sensation intact - negative neurological deficits noted.  Imaging not warranted at this time - chronic issue with no new symptoms, no new injuries or falls. Urine negative for infection. Patient stable, afebrile. This is a chronic condition that has been ongoing for the past 15 years  ago new symptoms, without new injury. Doubt cauda equina. Patient has an appointment with [facility] specialist regarding back discomfort. Pain controlled in ED setting. Discharged patient. Discussed with patient the importance of keeping appointment and to follow-up regarding back pain that has been ongoing for the past 15 years. Discharged patient with pain medication and muscle relaxer - discussed course, precautions and disposal. Discussed with patient to rest and stay hydrated, discussed no strenuous activity. Discussed with patient to continue to monitor symptoms and if symptoms are to worsen or change to report back to the ED - strict return instructions given.  Patient agreed to plan of care, understood, all questions answered.    Raymon Mutton, PA-C 05/11/13 1427

## 2013-05-10 NOTE — ED Notes (Signed)
Has appointment with a specialist on Sept 8, but states she can't deal with the pain until then. Wants [place] to get her pain under control and give her pain meds to go home with to get her through until her appointment.

## 2013-05-10 NOTE — ED Notes (Signed)
Pt called ems today for back pain x 15 years and pain has gotten worse. Denies new injuries. Took gabapentin with no relief. Ambulatory, [facility]

## 2013-05-10 NOTE — ED Notes (Signed)
I gave the patient a warm blanket. 

## 2013-05-11 NOTE — ED Provider Notes (Signed)
Medical screening examination/treatment/procedure(s) were performed by non-physician practitioner and as supervising physician I was immediately available for consultation/collaboration.  Norissa Bartee R. Jakyron Fabro, MD 05/11/13 0755 

## 2013-05-12 ENCOUNTER — Other Ambulatory Visit (HOSPITAL_BASED_OUTPATIENT_CLINIC_OR_DEPARTMENT_OTHER): Payer: PRIVATE HEALTH INSURANCE | Admitting: Lab

## 2013-05-12 ENCOUNTER — Encounter (HOSPITAL_COMMUNITY): Payer: Self-pay | Admitting: Emergency Medicine

## 2013-05-12 ENCOUNTER — Emergency Department (HOSPITAL_COMMUNITY)
Admission: EM | Admit: 2013-05-12 | Discharge: 2013-05-12 | Disposition: A | Discharge status: Home / Self Care | Payer: PRIVATE HEALTH INSURANCE | Attending: Emergency Medicine | Admitting: Emergency Medicine

## 2013-05-12 ENCOUNTER — Ambulatory Visit (HOSPITAL_BASED_OUTPATIENT_CLINIC_OR_DEPARTMENT_OTHER): Payer: PRIVATE HEALTH INSURANCE | Admitting: Pharmacist

## 2013-05-12 DIAGNOSIS — F411 Generalized anxiety disorder: Secondary | ICD-10-CM | POA: Insufficient documentation

## 2013-05-12 DIAGNOSIS — F3289 Other specified depressive episodes: Secondary | ICD-10-CM | POA: Insufficient documentation

## 2013-05-12 DIAGNOSIS — Z86718 Personal history of other venous thrombosis and embolism: Secondary | ICD-10-CM | POA: Insufficient documentation

## 2013-05-12 DIAGNOSIS — F329 Major depressive disorder, single episode, unspecified: Secondary | ICD-10-CM | POA: Insufficient documentation

## 2013-05-12 DIAGNOSIS — Z872 Personal history of diseases of the skin and subcutaneous tissue: Secondary | ICD-10-CM | POA: Insufficient documentation

## 2013-05-12 DIAGNOSIS — Z79899 Other long term (current) drug therapy: Secondary | ICD-10-CM | POA: Insufficient documentation

## 2013-05-12 DIAGNOSIS — Z7901 Long term (current) use of anticoagulants: Secondary | ICD-10-CM

## 2013-05-12 DIAGNOSIS — Z862 Personal history of diseases of the blood and blood-forming organs and certain disorders involving the immune mechanism: Secondary | ICD-10-CM | POA: Insufficient documentation

## 2013-05-12 DIAGNOSIS — I2699 Other pulmonary embolism without acute cor pulmonale: Secondary | ICD-10-CM

## 2013-05-12 DIAGNOSIS — J45909 Unspecified asthma, uncomplicated: Secondary | ICD-10-CM

## 2013-05-12 DIAGNOSIS — G8929 Other chronic pain: Secondary | ICD-10-CM | POA: Insufficient documentation

## 2013-05-12 DIAGNOSIS — K219 Gastro-esophageal reflux disease without esophagitis: Secondary | ICD-10-CM | POA: Insufficient documentation

## 2013-05-12 DIAGNOSIS — Z8659 Personal history of other mental and behavioral disorders: Secondary | ICD-10-CM | POA: Insufficient documentation

## 2013-05-12 DIAGNOSIS — Z8709 Personal history of other diseases of the respiratory system: Secondary | ICD-10-CM | POA: Insufficient documentation

## 2013-05-12 DIAGNOSIS — D6851 Activated protein C resistance: Secondary | ICD-10-CM

## 2013-05-12 DIAGNOSIS — Z8679 Personal history of other diseases of the circulatory system: Secondary | ICD-10-CM | POA: Insufficient documentation

## 2013-05-12 DIAGNOSIS — Z86711 Personal history of pulmonary embolism: Secondary | ICD-10-CM | POA: Insufficient documentation

## 2013-05-12 DIAGNOSIS — Z8639 Personal history of other endocrine, nutritional and metabolic disease: Secondary | ICD-10-CM | POA: Insufficient documentation

## 2013-05-12 DIAGNOSIS — Z8669 Personal history of other diseases of the nervous system and sense organs: Secondary | ICD-10-CM | POA: Insufficient documentation

## 2013-05-12 DIAGNOSIS — I83813 Varicose veins of bilateral lower extremities with pain: Secondary | ICD-10-CM

## 2013-05-12 DIAGNOSIS — I10 Essential (primary) hypertension: Secondary | ICD-10-CM | POA: Insufficient documentation

## 2013-05-12 DIAGNOSIS — Z8739 Personal history of other diseases of the musculoskeletal system and connective tissue: Secondary | ICD-10-CM | POA: Insufficient documentation

## 2013-05-12 DIAGNOSIS — E669 Obesity, unspecified: Secondary | ICD-10-CM

## 2013-05-12 DIAGNOSIS — R011 Cardiac murmur, unspecified: Secondary | ICD-10-CM | POA: Insufficient documentation

## 2013-05-12 DIAGNOSIS — D6859 Other primary thrombophilia: Secondary | ICD-10-CM

## 2013-05-12 DIAGNOSIS — E785 Hyperlipidemia, unspecified: Secondary | ICD-10-CM

## 2013-05-12 DIAGNOSIS — D509 Iron deficiency anemia, unspecified: Secondary | ICD-10-CM

## 2013-05-12 DIAGNOSIS — Z8701 Personal history of pneumonia (recurrent): Secondary | ICD-10-CM | POA: Insufficient documentation

## 2013-05-12 LAB — IRON AND TIBC CHCC
Iron: 38 ug/dL — ABNORMAL LOW (ref 41–142)
TIBC: 403 ug/dL (ref 236–444)
UIBC: 365 ug/dL (ref 120–384)

## 2013-05-12 LAB — CBC WITH DIFFERENTIAL/PLATELET
Basophils Absolute: 0.1 10*3/uL (ref 0.0–0.1)
EOS%: 2.7 % (ref 0.0–7.0)
Eosinophils Absolute: 0.2 10*3/uL (ref 0.0–0.5)
HCT: 36.7 % (ref 34.8–46.6)
HGB: 12.4 g/dL (ref 11.6–15.9)
MCH: 32.6 pg (ref 25.1–34.0)
MCV: 96.3 fL (ref 79.5–101.0)
MONO%: 6.6 % (ref 0.0–14.0)
NEUT#: 6.4 10*3/uL (ref 1.5–6.5)
NEUT%: 71.6 % (ref 38.4–76.8)
RDW: 14.8 % — ABNORMAL HIGH (ref 11.2–14.5)
lymph#: 1.7 10*3/uL (ref 0.9–3.3)

## 2013-05-12 LAB — COMPREHENSIVE METABOLIC PANEL (CC13)
AST: 21 U/L (ref 5–34)
Alkaline Phosphatase: 98 U/L (ref 40–150)
BUN: 13 mg/dL (ref 7.0–26.0)
Glucose: 101 mg/dl (ref 70–140)
Potassium: 4.4 mEq/L (ref 3.5–5.1)
Total Bilirubin: 0.27 mg/dL (ref 0.20–1.20)

## 2013-05-12 LAB — PROTIME-INR: INR: 3 (ref 2.00–3.50)

## 2013-05-12 MED ORDER — ONDANSETRON 8 MG PO TBDP
8.0000 mg | ORAL_TABLET | Freq: Once | ORAL | Status: AC
  Administered 2013-05-12: 8 mg via ORAL
  Filled 2013-05-12: qty 1

## 2013-05-12 MED ORDER — HYDROMORPHONE HCL PF 2 MG/ML IJ SOLN
2.0000 mg | Freq: Once | INTRAMUSCULAR | Status: AC
  Administered 2013-05-12: 2 mg via INTRAMUSCULAR
  Filled 2013-05-12: qty 1

## 2013-05-12 NOTE — Addendum Note (Signed)
Addended by: Elwanda Brooklyn on: 05/12/2013 02:40 PM   Modules accepted: Level of Service

## 2013-05-12 NOTE — Patient Instructions (Signed)
Continue Coumadin 5mg  every day.   Recheck INR on 06/10/13.

## 2013-05-12 NOTE — ED Notes (Signed)
Pt c/o pain all over from a pinched nerve in her neck/back.  Has been seen previously for this at [facility].

## 2013-05-12 NOTE — ED Provider Notes (Signed)
CSN: [id number]     Arrival date & time 05/12/13  1153 History     First MD Initiated Contact with Patient 05/12/13 1245     Chief Complaint  Patient presents with  . Back Pain   (Consider location/radiation/quality/duration/timing/severity/associated sxs/prior Treatment) HPI Comments: Patient presents emergency department with chief complaint of chronic pain. She states that she has a pinched nerve in her back. She states that she has been dealing with chronic pain for many years. She has an appointment scheduled with a spine specialist on September 8. She denies any new symptoms. Denies bowel or bladder incontinence. Denies any motor deficits. Denies any ataxia. She states the pain is moderate to severe, and has not improved with tramadol Vicodin.  The history is provided by the patient. No language interpreter was used.    Past Medical History  Diagnosis Date  . Depression   . Schizophrenia   . Hyperlipidemia   . Hypertension   . Chronic pain     "over my whole body" (03/30/2013)  . 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 DVT (deep venous thrombosis)     per 01/17/10 d/c summary- "remote hx of dvt"?  . Iron deficiency anemia   . Glaucoma   . Hemorrhoids   . Asthma   . Anxiety   . GERD (gastroesophageal reflux disease)   . Psoriasis 04/29/2012    New onset evaluated by Dr Marylou Flesher, Dermatology, [facility] 6/13  Rx 0.1% Tacrolimus ointment  . Complication of anesthesia     "because I have sleep apnea" (03/30/2013)  . Heart murmur   . Chest pain, exertional   . Chronic bronchitis   . Sleep apnea     "waiting on my CPAP" (03/30/2013)  . Pneumonia     "a few times" (03/30/2013)  . Exertional shortness of breath     "& sometimes when laying down" (03/30/2013)  . Daily headache     "last 2 months" (03/30/2013)  . Arthritis     "joints" (03/30/2013)  . Varicose veins of legs    Past Surgical History  Procedure Laterality Date  . Spinal fusion      2004   . Carpal tunnel release Bilateral   . Tubal ligation  1973  . Dilation and curettage of uterus  1970's    "once" (03/30/2013)  . 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 Joint replacement     Family History  Problem Relation Age of Onset  . Diabetes Sister   . Colon cancer Brother 40   History  Substance Use Topics  . Smoking status: Never Smoker   . Smokeless tobacco: Never Used  . Alcohol Use: No   OB History   Grav Para Term Preterm Abortions TAB SAB Ect Mult Living                 Review of Systems  All other systems reviewed and are negative.    Allergies  Tramadol and Vicodin  Home Medications   Current Outpatient Rx  Name  Route  Sig  Dispense  Refill  . albuterol (VENTOLIN HFA) 108 (90 BASE) MCG/ACT inhaler   Inhalation   Inhale 2 puffs into the lungs 2 (two) times daily as needed for wheezing or shortness of breath.          Marland Kitchen atenolol (TENORMIN) 100 MG tablet   Oral   Take 100 mg by mouth daily.          Marland Kitchen  camphor-menthol (SARNA) lotion   Topical   Apply 1 application topically 2 (two) times daily.         Marland Kitchen esomeprazole (NEXIUM) 40 MG capsule   Oral   Take 40 mg by mouth daily before breakfast.          . FLUoxetine (PROZAC) 20 MG capsule   Oral   Take 60 mg by mouth daily.          . fluticasone (FLOVENT HFA) 220 MCG/ACT inhaler   Inhalation   Inhale 1 puff into the lungs 2 (two) times daily.          Marland Kitchen gabapentin (NEURONTIN) 100 MG capsule   Oral   Take 200 mg by mouth 3 (three) times daily.         . hydrochlorothiazide (HYDRODIURIL) 25 MG tablet   Oral   Take 1 tablet (25 mg total) by mouth daily.   30 tablet   0   . meclizine (ANTIVERT) 25 MG tablet   Oral   Take 25 mg by mouth 3 (three) times daily as needed for dizziness.          . methocarbamol (ROBAXIN) 500 MG tablet   Oral   Take 1 tablet (500 mg total) by mouth 2 (two) times daily.   20 tablet   0   . ondansetron (ZOFRAN) 4 MG tablet   Oral   Take 1  tablet (4 mg total) by mouth every 6 (six) hours. As needed for nausea   12 tablet   0   . pramipexole (MIRAPEX) 0.125 MG tablet   Oral   Take 0.375 mg by mouth at bedtime.          Marland Kitchen PRESCRIPTION MEDICATION   Topical   Apply 1 application topically daily. Silver sulfadiazine / triamcinolone cream= compounded by [facility]         . tacrolimus (PROTOPIC) 0.1 % ointment   Topical   Apply 1 application topically daily.          Marland Kitchen tiZANidine (ZANAFLEX) 2 MG tablet   Oral   Take 2 mg by mouth daily as needed (for muscle spasms).          . triamcinolone cream (KENALOG) 0.1 %   Topical   Apply 1 application topically daily.          Marland Kitchen warfarin (COUMADIN) 5 MG tablet   Oral   Take 5 mg by mouth every morning.           BP 127/87  Pulse 81  Temp(Src) 99 F (37.2 C) (Oral)  Resp 18  SpO2 98%  LMP 11/22/1996 Physical Exam  Nursing note and vitals reviewed. Constitutional: She is oriented to person, place, and time. She appears well-developed and well-nourished.  Patient wearing a soft cervical collar, which she wears consistently  HENT:  Head: Normocephalic and atraumatic.  Eyes: Conjunctivae and EOM are normal. Pupils are equal, round, and reactive to light.  Neck: Normal range of motion. Neck supple.  Cardiovascular: Normal rate and regular rhythm.  Exam reveals no gallop and no friction rub.   No murmur heard. Pulmonary/Chest: Effort normal and breath sounds normal. No respiratory distress. She has no wheezes. She has no rales. She exhibits no tenderness.  Abdominal: Soft. Bowel sounds are normal. She exhibits no distension and no mass. There is no tenderness. There is no rebound and no guarding.  Musculoskeletal: Normal range of motion. She exhibits tenderness. She exhibits no edema.  Cervical  paraspinal tenderness, no bony step-offs or deformities of the CTLS-spine  Neurological: She is alert and oriented to person, place, and time.  CN 3-12 intact, sensation and  strength  Skin: Skin is warm and dry.  Psychiatric: She has a normal mood and affect. Her behavior is normal. Judgment and thought content normal.    ED Course   Procedures (including critical care time)  Labs Reviewed - No data to display No results found. 1. Chronic pain     MDM  Patient with back pain.  No neurological deficits and normal neuro exam.  Patient can walk but states is painful.  No loss of bowel or bladder control.  No concern for cauda equina.  No fever, night sweats, weight loss, h/o cancer, IVDU.  RICE protocol and pain medicine indicated and discussed with patient.    Roxy Horseman, PA-C 05/12/13 1312

## 2013-05-12 NOTE — Progress Notes (Signed)
Pt seen in clinic today. INR=3.0 on 5mg  daily Lengthy conversation with patient today regarding her continuity of care as she stated she was "headed straight to ER for per pain" She was defensive when I asked if she had a PCP.  "I don't trust any doctors here.  All the doctors in [place] knew me and how to treat me.  I am going to find a new PCP" When asked about all her ER visits she stated "What do you mean all my ER visits? It is my right to go to an ER.  All those computers do is spy on you." I informed her continuity of care is key and she needs to give a doctor a chance to get to know her and understand her needs.   She does have an appmt at the [facility] with Dr. Leeanne [name] on Sept 2.  She states, "[name] [name] will take much better care of her."  She continues to take care of her 78 yo son with a seizure disorder.    She will continue on her 5mg  daily of coumadin. RTC on 06/10/13 at 10:30 for lab and 10:45 clinic.

## 2013-05-13 NOTE — ED Provider Notes (Signed)
Medical screening examination/treatment/procedure(s) were performed by non-physician practitioner and as supervising physician I was immediately available for consultation/collaboration.   Zara Wendt M Belma Dyches, MD 05/13/13 0736 

## 2013-05-14 NOTE — ED Provider Notes (Signed)
Medical screening examination/treatment/procedure(s) were performed by non-physician practitioner and as supervising physician I was immediately available for consultation/collaboration.   Dushaun Okey E Chucky Homes, MD 05/14/13 1522 

## 2013-05-26 ENCOUNTER — Telehealth: Payer: Self-pay | Admitting: *Deleted

## 2013-05-26 ENCOUNTER — Telehealth: Payer: Self-pay | Admitting: Oncology

## 2013-05-26 ENCOUNTER — Encounter (HOSPITAL_COMMUNITY): Payer: Self-pay | Admitting: *Deleted

## 2013-05-26 ENCOUNTER — Emergency Department (HOSPITAL_COMMUNITY)
Admission: EM | Admit: 2013-05-26 | Discharge: 2013-05-26 | Disposition: A | Discharge status: Home / Self Care | Payer: PRIVATE HEALTH INSURANCE | Attending: Emergency Medicine | Admitting: Emergency Medicine

## 2013-05-26 DIAGNOSIS — J45901 Unspecified asthma with (acute) exacerbation: Secondary | ICD-10-CM | POA: Insufficient documentation

## 2013-05-26 DIAGNOSIS — G8929 Other chronic pain: Secondary | ICD-10-CM | POA: Insufficient documentation

## 2013-05-26 DIAGNOSIS — R5381 Other malaise: Secondary | ICD-10-CM | POA: Insufficient documentation

## 2013-05-26 DIAGNOSIS — F3289 Other specified depressive episodes: Secondary | ICD-10-CM | POA: Insufficient documentation

## 2013-05-26 DIAGNOSIS — Z79899 Other long term (current) drug therapy: Secondary | ICD-10-CM | POA: Insufficient documentation

## 2013-05-26 DIAGNOSIS — F329 Major depressive disorder, single episode, unspecified: Secondary | ICD-10-CM | POA: Insufficient documentation

## 2013-05-26 DIAGNOSIS — Z8639 Personal history of other endocrine, nutritional and metabolic disease: Secondary | ICD-10-CM | POA: Insufficient documentation

## 2013-05-26 DIAGNOSIS — F209 Schizophrenia, unspecified: Secondary | ICD-10-CM | POA: Insufficient documentation

## 2013-05-26 DIAGNOSIS — I10 Essential (primary) hypertension: Secondary | ICD-10-CM | POA: Insufficient documentation

## 2013-05-26 DIAGNOSIS — Z8669 Personal history of other diseases of the nervous system and sense organs: Secondary | ICD-10-CM | POA: Insufficient documentation

## 2013-05-26 DIAGNOSIS — R209 Unspecified disturbances of skin sensation: Secondary | ICD-10-CM | POA: Insufficient documentation

## 2013-05-26 DIAGNOSIS — Z872 Personal history of diseases of the skin and subcutaneous tissue: Secondary | ICD-10-CM | POA: Insufficient documentation

## 2013-05-26 DIAGNOSIS — Z8739 Personal history of other diseases of the musculoskeletal system and connective tissue: Secondary | ICD-10-CM | POA: Insufficient documentation

## 2013-05-26 DIAGNOSIS — K219 Gastro-esophageal reflux disease without esophagitis: Secondary | ICD-10-CM | POA: Insufficient documentation

## 2013-05-26 DIAGNOSIS — Z86718 Personal history of other venous thrombosis and embolism: Secondary | ICD-10-CM | POA: Insufficient documentation

## 2013-05-26 DIAGNOSIS — Z8701 Personal history of pneumonia (recurrent): Secondary | ICD-10-CM | POA: Insufficient documentation

## 2013-05-26 DIAGNOSIS — Z862 Personal history of diseases of the blood and blood-forming organs and certain disorders involving the immune mechanism: Secondary | ICD-10-CM | POA: Insufficient documentation

## 2013-05-26 DIAGNOSIS — Z86711 Personal history of pulmonary embolism: Secondary | ICD-10-CM | POA: Insufficient documentation

## 2013-05-26 DIAGNOSIS — M549 Dorsalgia, unspecified: Secondary | ICD-10-CM | POA: Insufficient documentation

## 2013-05-26 DIAGNOSIS — R011 Cardiac murmur, unspecified: Secondary | ICD-10-CM | POA: Insufficient documentation

## 2013-05-26 DIAGNOSIS — F411 Generalized anxiety disorder: Secondary | ICD-10-CM | POA: Insufficient documentation

## 2013-05-26 MED ORDER — ONDANSETRON HCL 4 MG PO TABS: 4.0000 mg | ORAL_TABLET | Freq: Three times a day (TID) | ORAL | Status: DC | PRN

## 2013-05-26 MED ORDER — ONDANSETRON 4 MG PO TBDP
4.0000 mg | ORAL_TABLET | Freq: Once | ORAL | Status: AC
  Administered 2013-05-26: 4 mg via ORAL
  Filled 2013-05-26: qty 1

## 2013-05-26 MED ORDER — KETOROLAC TROMETHAMINE 60 MG/2ML IM SOLN
60.0000 mg | Freq: Once | INTRAMUSCULAR | Status: AC
  Administered 2013-05-26: 60 mg via INTRAMUSCULAR
  Filled 2013-05-26: qty 2

## 2013-05-26 NOTE — ED Notes (Signed)
Patient ambulated to room from ED waiting.   She advised that she has been being seen by [facility] and received a shot of toradol, which she advised helped a lot.   Patient requests another shot of toradol.

## 2013-05-26 NOTE — Telephone Encounter (Signed)
Called pt ,left message appt on 9/16 changed to 3:45pm lab, Coumadin and MD

## 2013-05-26 NOTE — Telephone Encounter (Addendum)
Amy called asking for clearance for myelogram for this patient.  Would like to know what to do for coumadin and if patient needs a Lovenox bridge.  Awaiting Dr. Patsy Lager clearance in order to schedule the myelogram.  Will notify providers.  Amy can be reached at [phone].

## 2013-05-26 NOTE — ED Provider Notes (Signed)
CSN: [id number]     Arrival date & time 05/26/13  1304 History   First MD Initiated Contact with Patient 05/26/13 1423     Chief Complaint  Patient presents with  . Back Pain   (Consider location/radiation/quality/duration/timing/severity/associated sxs/prior Treatment) HPI Comments: Patient presents with her chronic back pain. Patient states that she has had pain up and down her entire spine for the past 10 years since she had a spinal fusion performed by Dr. Wynetta Emery and soon after endeavor to help her son having a seizure. States she has had this pain ever since and has gradually worsened over the years. Pain goes down into her legs. She has an area of numbness over her right anterior thigh. States she has also had her left knee replaced and has constant pain in it. She is given pain medications by her primary care provider which she states she has given to her sister because she does not like medications because they upset her stomach. States that she has previously been given muscle relaxants 2 and she either flushed them down the toilet or given to her sister as well. She is currently taking no medications for her chronic back pain she was seen by the spine Center at [facility] this morning and there is planned for imaging to investigate her chronic back pain. States she came to the emergency department here instead of [facility] preference and is requesting a Toradol shot. States that she has contracted things over the years and Toradol as the only thing that helps her pain. Denies any change in her chronic pain acutely. Denies fever, chills, loss of control of bowel or bladder, any change in the numbness or weakness she has been her legs chronically. Denies belly pain or other new or abnormal symptoms.  Patient states she always has pain all over her body past during examination patient admits to chest wall pain and abdominal pain that have been the same and unchanged for years.   The history is provided by  the patient.    Past Medical History  Diagnosis Date  . Depression   . Schizophrenia   . Hyperlipidemia   . Hypertension   . Chronic pain     "over my whole body" (03/30/2013)  . 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 DVT (deep venous thrombosis)     per 01/17/10 d/c summary- "remote hx of dvt"?  . Iron deficiency anemia   . Glaucoma   . Hemorrhoids   . Asthma   . Anxiety   . GERD (gastroesophageal reflux disease)   . Psoriasis 04/29/2012    New onset evaluated by Dr Marylou Flesher, Dermatology, [facility] 6/13  Rx 0.1% Tacrolimus ointment  . Complication of anesthesia     "because I have sleep apnea" (03/30/2013)  . Heart murmur   . Chest pain, exertional   . Chronic bronchitis   . Sleep apnea     "waiting on my CPAP" (03/30/2013)  . Pneumonia     "a few times" (03/30/2013)  . Exertional shortness of breath     "& sometimes when laying down" (03/30/2013)  . Daily headache     "last 2 months" (03/30/2013)  . Arthritis     "joints" (03/30/2013)  . Varicose veins of legs    Past Surgical History  Procedure Laterality Date  . Spinal fusion      2004  . Carpal tunnel release Bilateral   . Tubal ligation  1973  .  Dilation and curettage of uterus  1970's    "once" (03/30/2013)  . 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 Joint replacement     Family History  Problem Relation Age of Onset  . Diabetes Sister   . Colon cancer Brother 40   History  Substance Use Topics  . Smoking status: Never Smoker   . Smokeless tobacco: Never Used  . Alcohol Use: No   OB History   Grav Para Term Preterm Abortions TAB SAB Ect Mult Living                 Review of Systems  Constitutional: Negative for fever and chills.  Respiratory: Negative for cough and shortness of breath.   Gastrointestinal: Negative for nausea, vomiting, diarrhea and constipation.  Genitourinary: Negative for dysuria.  Musculoskeletal: Positive for back pain.  Neurological: Positive for weakness  and numbness.    Allergies  Tramadol and Vicodin  Home Medications   Current Outpatient Rx  Name  Route  Sig  Dispense  Refill  . albuterol (VENTOLIN HFA) 108 (90 BASE) MCG/ACT inhaler   Inhalation   Inhale 2 puffs into the lungs 2 (two) times daily as needed for wheezing or shortness of breath.          Marland Kitchen atenolol (TENORMIN) 100 MG tablet   Oral   Take 100 mg by mouth daily.          . camphor-menthol (SARNA) lotion   Topical   Apply 1 application topically 2 (two) times daily.         Marland Kitchen esomeprazole (NEXIUM) 40 MG capsule   Oral   Take 40 mg by mouth daily before breakfast.          . FLUoxetine (PROZAC) 20 MG capsule   Oral   Take 60 mg by mouth daily.          . fluticasone (FLOVENT HFA) 220 MCG/ACT inhaler   Inhalation   Inhale 1 puff into the lungs 2 (two) times daily.          Marland Kitchen gabapentin (NEURONTIN) 100 MG capsule   Oral   Take 200 mg by mouth 3 (three) times daily.         . hydrochlorothiazide (HYDRODIURIL) 25 MG tablet   Oral   Take 1 tablet (25 mg total) by mouth daily.   30 tablet   0   . meclizine (ANTIVERT) 25 MG tablet   Oral   Take 25 mg by mouth 3 (three) times daily as needed for dizziness.          . methocarbamol (ROBAXIN) 500 MG tablet   Oral   Take 1 tablet (500 mg total) by mouth 2 (two) times daily.   20 tablet   0   . ondansetron (ZOFRAN) 4 MG tablet   Oral   Take 1 tablet (4 mg total) by mouth every 6 (six) hours. As needed for nausea   12 tablet   0   . pramipexole (MIRAPEX) 0.125 MG tablet   Oral   Take 0.375 mg by mouth at bedtime.          Marland Kitchen PRESCRIPTION MEDICATION   Topical   Apply 1 application topically daily. Silver sulfadiazine / triamcinolone cream= compounded by [facility]         . tacrolimus (PROTOPIC) 0.1 % ointment   Topical   Apply 1 application topically daily.          Marland Kitchen tiZANidine (ZANAFLEX) 2 MG tablet  Oral   Take 2 mg by mouth daily as needed (for muscle spasms).          .  triamcinolone cream (KENALOG) 0.1 %   Topical   Apply 1 application topically daily.          Marland Kitchen warfarin (COUMADIN) 5 MG tablet   Oral   Take 5 mg by mouth every morning.           BP 141/73  Pulse 68  Temp(Src) 98.2 F (36.8 C) (Oral)  Resp 16  SpO2 97%  LMP 11/22/1996 Physical Exam  Nursing note and vitals reviewed. Constitutional: She appears well-developed and well-nourished. No distress.  HENT:  Head: Normocephalic and atraumatic.  Neck: Neck supple.  Cardiovascular: Normal rate and regular rhythm.   Pulmonary/Chest: Effort normal and breath sounds normal. No respiratory distress. She has no wheezes. She has no rales. She exhibits tenderness.  Abdominal: Soft. She exhibits no distension. There is generalized tenderness. There is no rebound and no guarding.  Musculoskeletal:       Arms: Spine: no crepitus, or steopoffs, no focal areas of tenderness  Lower extremities:  Strength 5/5 on right, 4/5 on left (pt states is unchanged x years) with lifting off the bed with 5/5 with plantar flexion and dorsiflexion, sensation intact, distal pulses intact.     Patient ambulates well with a cane  Neurological: She is alert.  Skin: She is not diaphoretic.    ED Course  Procedures (including critical care time) Labs Review Labs Reviewed - No data to display Imaging Review No results found.  Discussed pt with Dr Blinda Leatherwood who will also see the patient.   MDM   1. Chronic back pain    Pt with chronic back pain Raynelle Fanning worsening of her tendon years, is a frequent visitor to the emergency department for the same, presents today for an injection of Toradol. Patient denies any new or concerning symptoms. There are no red flags. Patient was seen by the spine Center at [facility] this morning and will be followed by them. Patient does not take any pain medications at home because she states she does not like taking strong pain medications. This is why her pain isn't controlled. Patient  given Toradol injection here. Patient also frequently changes her primary care provider because she says she does not like that they gave her different medications although time and change her medications.  Patient does request prescription for Zofran as a refill from her daily home medications. This is not related to her visit today patient to followup with primary care and spine Center. Pt given return precautions.  Pt verbalizes understanding and agrees with plan.       Trixie Dredge, PA-C 05/26/13 1620

## 2013-05-26 NOTE — Telephone Encounter (Signed)
Noted Dr. Cyndie Chime is out of office through 06-01-2013.  Called Amy at [phone] with this information.

## 2013-05-26 NOTE — ED Notes (Signed)
Pt here for back pain that radiates into bilateral legs. Was here on 8/19 for same. Ambulatory at triage with a cane.

## 2013-05-26 NOTE — ED Notes (Signed)
Disregard change of acuity - it was done in error.

## 2013-05-27 NOTE — ED Provider Notes (Signed)
Medical screening examination/treatment/procedure(s) were conducted as a shared visit with non-physician practitioner(s) and myself.  I personally evaluated the patient during the encounter.   12 chronic back pain comes to the ER for a shot of pain medication. Examination reveals diffuse tenderness without any neurologic findings. Patient requesting Toradol. She reports this did not have to be back doctor for pain medication because it was her first visit she did not want them to think that she was a drug seeker. Patient treated with analgesics, follow up as scheduled with her back surgeon.  Gilda Crease, MD 05/27/13 1351

## 2013-06-02 ENCOUNTER — Telehealth: Payer: Self-pay | Admitting: *Deleted

## 2013-06-02 NOTE — Telephone Encounter (Signed)
Received call yest from Amy/[facility] asking for clearance to go off coumadin & perhaps lovenox bridge for myelogram.  Call back # [phone].  Notes faxed today per Dr Cyndie Chime to Amy & message left that if she has questions to call back.

## 2013-06-09 ENCOUNTER — Ambulatory Visit (HOSPITAL_BASED_OUTPATIENT_CLINIC_OR_DEPARTMENT_OTHER): Payer: PRIVATE HEALTH INSURANCE | Admitting: Pharmacist

## 2013-06-09 ENCOUNTER — Ambulatory Visit (HOSPITAL_BASED_OUTPATIENT_CLINIC_OR_DEPARTMENT_OTHER): Payer: PRIVATE HEALTH INSURANCE | Admitting: Oncology

## 2013-06-09 ENCOUNTER — Emergency Department (HOSPITAL_COMMUNITY)
Admission: EM | Admit: 2013-06-09 | Discharge: 2013-06-09 | Disposition: A | Discharge status: Home / Self Care | Payer: PRIVATE HEALTH INSURANCE | Attending: Emergency Medicine | Admitting: Emergency Medicine

## 2013-06-09 ENCOUNTER — Other Ambulatory Visit (HOSPITAL_BASED_OUTPATIENT_CLINIC_OR_DEPARTMENT_OTHER): Payer: PRIVATE HEALTH INSURANCE | Admitting: Lab

## 2013-06-09 ENCOUNTER — Encounter (HOSPITAL_COMMUNITY): Payer: Self-pay | Admitting: Emergency Medicine

## 2013-06-09 VITALS — BP 118/71 | HR 56 | Temp 98.5°F | Resp 20 | Ht 65.0 in | Wt 236.5 lb

## 2013-06-09 DIAGNOSIS — D6859 Other primary thrombophilia: Secondary | ICD-10-CM

## 2013-06-09 DIAGNOSIS — Z9889 Other specified postprocedural states: Secondary | ICD-10-CM | POA: Insufficient documentation

## 2013-06-09 DIAGNOSIS — F209 Schizophrenia, unspecified: Secondary | ICD-10-CM | POA: Insufficient documentation

## 2013-06-09 DIAGNOSIS — Z862 Personal history of diseases of the blood and blood-forming organs and certain disorders involving the immune mechanism: Secondary | ICD-10-CM | POA: Insufficient documentation

## 2013-06-09 DIAGNOSIS — I2699 Other pulmonary embolism without acute cor pulmonale: Secondary | ICD-10-CM

## 2013-06-09 DIAGNOSIS — M549 Dorsalgia, unspecified: Secondary | ICD-10-CM | POA: Diagnosis present

## 2013-06-09 DIAGNOSIS — I10 Essential (primary) hypertension: Secondary | ICD-10-CM | POA: Insufficient documentation

## 2013-06-09 DIAGNOSIS — Z7901 Long term (current) use of anticoagulants: Secondary | ICD-10-CM

## 2013-06-09 DIAGNOSIS — F411 Generalized anxiety disorder: Secondary | ICD-10-CM | POA: Insufficient documentation

## 2013-06-09 DIAGNOSIS — Z8639 Personal history of other endocrine, nutritional and metabolic disease: Secondary | ICD-10-CM | POA: Insufficient documentation

## 2013-06-09 DIAGNOSIS — G8929 Other chronic pain: Secondary | ICD-10-CM | POA: Insufficient documentation

## 2013-06-09 DIAGNOSIS — Z8701 Personal history of pneumonia (recurrent): Secondary | ICD-10-CM | POA: Insufficient documentation

## 2013-06-09 DIAGNOSIS — H40009 Preglaucoma, unspecified, unspecified eye: Secondary | ICD-10-CM | POA: Insufficient documentation

## 2013-06-09 DIAGNOSIS — K219 Gastro-esophageal reflux disease without esophagitis: Secondary | ICD-10-CM | POA: Insufficient documentation

## 2013-06-09 DIAGNOSIS — Z79899 Other long term (current) drug therapy: Secondary | ICD-10-CM | POA: Insufficient documentation

## 2013-06-09 DIAGNOSIS — D6851 Activated protein C resistance: Secondary | ICD-10-CM

## 2013-06-09 DIAGNOSIS — R011 Cardiac murmur, unspecified: Secondary | ICD-10-CM | POA: Insufficient documentation

## 2013-06-09 DIAGNOSIS — IMO0002 Reserved for concepts with insufficient information to code with codable children: Secondary | ICD-10-CM | POA: Insufficient documentation

## 2013-06-09 DIAGNOSIS — M545 Low back pain, unspecified: Secondary | ICD-10-CM | POA: Insufficient documentation

## 2013-06-09 DIAGNOSIS — F3289 Other specified depressive episodes: Secondary | ICD-10-CM | POA: Insufficient documentation

## 2013-06-09 DIAGNOSIS — Z8739 Personal history of other diseases of the musculoskeletal system and connective tissue: Secondary | ICD-10-CM | POA: Insufficient documentation

## 2013-06-09 DIAGNOSIS — Z872 Personal history of diseases of the skin and subcutaneous tissue: Secondary | ICD-10-CM | POA: Insufficient documentation

## 2013-06-09 DIAGNOSIS — Z86711 Personal history of pulmonary embolism: Secondary | ICD-10-CM | POA: Insufficient documentation

## 2013-06-09 DIAGNOSIS — Z86718 Personal history of other venous thrombosis and embolism: Secondary | ICD-10-CM | POA: Insufficient documentation

## 2013-06-09 DIAGNOSIS — F329 Major depressive disorder, single episode, unspecified: Secondary | ICD-10-CM | POA: Insufficient documentation

## 2013-06-09 DIAGNOSIS — J45909 Unspecified asthma, uncomplicated: Secondary | ICD-10-CM | POA: Insufficient documentation

## 2013-06-09 LAB — CBC WITH DIFFERENTIAL/PLATELET
Basophils Absolute: 0 10*3/uL (ref 0.0–0.1)
Eosinophils Absolute: 0.1 10*3/uL (ref 0.0–0.5)
HCT: 38.3 % (ref 34.8–46.6)
HGB: 12.6 g/dL (ref 11.6–15.9)
LYMPH%: 22.4 % (ref 14.0–49.7)
MCV: 95 fL (ref 79.5–101.0)
MONO#: 0.4 10*3/uL (ref 0.1–0.9)
MONO%: 5.5 % (ref 0.0–14.0)
NEUT#: 4.8 10*3/uL (ref 1.5–6.5)
NEUT%: 70.7 % (ref 38.4–76.8)
Platelets: 306 10*3/uL (ref 145–400)
RBC: 4.03 10*6/uL (ref 3.70–5.45)
WBC: 6.7 10*3/uL (ref 3.9–10.3)

## 2013-06-09 LAB — PROTIME-INR: Protime: 24 Seconds — ABNORMAL HIGH (ref 10.6–13.4)

## 2013-06-09 LAB — D-DIMER, QUANTITATIVE: D-Dimer, Quant: 0.3 ug/mL-FEU (ref 0.00–0.48)

## 2013-06-09 MED ORDER — HYDROMORPHONE HCL PF 1 MG/ML IJ SOLN
1.0000 mg | Freq: Once | INTRAMUSCULAR | Status: AC
  Administered 2013-06-09: 1 mg via INTRAMUSCULAR
  Filled 2013-06-09: qty 1

## 2013-06-09 MED ORDER — ENOXAPARIN SODIUM 150 MG/ML ~~LOC~~ SOLN: 150.0000 mg | Freq: Every day | SUBCUTANEOUS | Status: DC

## 2013-06-09 NOTE — ED Notes (Signed)
Pt c/o back pain x 1 month.  Pt states she understands that "we can't fix it today".  States that she has tests scheduled at [facility].  Just would like something for the pain.

## 2013-06-09 NOTE — Progress Notes (Signed)
INR within goal today. CBC is wnl today. No problems to report regarding anticoagulation. Pt has severe back and leg pain. She is scheduled for a lumbar myelogram on 06/12/13 at [facility] to these pains. She was instructed to hold her coumadin for 5 days prior to procedure. She forgot. She took coumadin on 9/14. She didn't take coumadin on 06/08/13. She knows to continue to hold coumadin the remainder of this week. She will resume her coumadin after her procedure on 06/12/13.  Pt states that she is not eating much. She had recent dental work and it is hard to chew her salads. Also, her stomach is upset from her back and leg pain. She doesn't feel like eating. She stopped her medications for pain, muscle spasms and muscle relaxants because they give her terrible heartburn. PRN Mylanta use. Hold coumadin until 06/12/13 evening. On 06/12/13, continue Coumadin 5mg  every day.   Recheck INR on 07/07/13: lab at 11am and coumadin clinic at 11:15am.

## 2013-06-09 NOTE — ED Provider Notes (Signed)
CSN: [id number]     Arrival date & time 06/09/13  1814 History   First MD Initiated Contact with Patient 06/09/13 2208     Chief Complaint  Patient presents with  . Back Pain   (Consider location/radiation/quality/duration/timing/severity/associated sxs/prior Treatment) Patient is a 72 y.o. female presenting with back pain. The history is provided by the patient.  Back Pain Location:  Lumbar spine Quality:  Aching Radiates to: right and left legs. Pain severity:  Moderate Pain is:  Same all the time Onset quality:  Gradual Duration: 3-4. Timing:  Constant Progression:  Waxing and waning Chronicity:  Chronic Relieved by: ambulation helps. Worsened by:  Nothing tried Ineffective treatments: tramadol, nsaids, narcotics. Associated symptoms: numbness (right anterior thigh, unchanged) and paresthesias (bilateral LE's)   Associated symptoms: no abdominal pain, no bladder incontinence, no bowel incontinence, no chest pain, no dysuria, no fever, no headaches, no perianal numbness and no weakness     Past Medical History  Diagnosis Date  . Depression   . Schizophrenia   . Hyperlipidemia   . Hypertension   . Chronic pain     "over my whole body" (03/30/2013)  . 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 DVT (deep venous thrombosis)     per 01/17/10 d/c summary- "remote hx of dvt"?  . Iron deficiency anemia   . Glaucoma   . Hemorrhoids   . Asthma   . Anxiety   . GERD (gastroesophageal reflux disease)   . Psoriasis 04/29/2012    New onset evaluated by Dr Marylou Flesher, Dermatology, [facility] 6/13  Rx 0.1% Tacrolimus ointment  . Complication of anesthesia     "because I have sleep apnea" (03/30/2013)  . Heart murmur   . Chest pain, exertional   . Chronic bronchitis   . Sleep apnea     "waiting on my CPAP" (03/30/2013)  . Pneumonia     "a few times" (03/30/2013)  . Exertional shortness of breath     "& sometimes when laying down" (03/30/2013)  . Daily headache    "last 2 months" (03/30/2013)  . Arthritis     "joints" (03/30/2013)  . Varicose veins of legs    Past Surgical History  Procedure Laterality Date  . Spinal fusion      2004  . Carpal tunnel release Bilateral   . Tubal ligation  1973  . Dilation and curettage of uterus  1970's    "once" (03/30/2013)  . 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 Joint replacement     Family History  Problem Relation Age of Onset  . Diabetes Sister   . Colon cancer Brother 40   History  Substance Use Topics  . Smoking status: Never Smoker   . Smokeless tobacco: Never Used  . Alcohol Use: No   OB History   Grav Para Term Preterm Abortions TAB SAB Ect Mult Living                 Review of Systems  Constitutional: Negative for fever and fatigue.  HENT: Negative for congestion, drooling and neck pain.   Eyes: Negative for pain.  Respiratory: Negative for cough and shortness of breath.   Cardiovascular: Negative for chest pain.  Gastrointestinal: Negative for nausea, vomiting, abdominal pain, diarrhea and bowel incontinence.  Genitourinary: Negative for bladder incontinence, dysuria and hematuria.  Musculoskeletal: Positive for back pain. Negative for gait problem.  Skin: Negative for color change.  Neurological: Positive for numbness (right anterior  thigh, unchanged) and paresthesias (bilateral LE's). Negative for dizziness, weakness and headaches.  Hematological: Negative for adenopathy.  Psychiatric/Behavioral: Negative for behavioral problems.  All other systems reviewed and are negative.    Allergies  Tramadol and Vicodin  Home Medications   Current Outpatient Rx  Name  Route  Sig  Dispense  Refill  . albuterol (VENTOLIN HFA) 108 (90 BASE) MCG/ACT inhaler   Inhalation   Inhale 2 puffs into the lungs 2 (two) times daily as needed for wheezing or shortness of breath.          Marland Kitchen atenolol (TENORMIN) 100 MG tablet   Oral   Take 100 mg by mouth daily.          . camphor-menthol  (SARNA) lotion   Topical   Apply 1 application topically 2 (two) times daily.         Marland Kitchen esomeprazole (NEXIUM) 40 MG capsule   Oral   Take 40 mg by mouth daily before breakfast.          . FLUoxetine (PROZAC) 20 MG capsule   Oral   Take 60 mg by mouth daily.          . fluticasone (FLOVENT HFA) 220 MCG/ACT inhaler   Inhalation   Inhale 1 puff into the lungs 2 (two) times daily.          . hydrochlorothiazide (HYDRODIURIL) 25 MG tablet   Oral   Take 1 tablet (25 mg total) by mouth daily.   30 tablet   0   . meclizine (ANTIVERT) 25 MG tablet   Oral   Take 25 mg by mouth 3 (three) times daily as needed for dizziness.          . ondansetron (ZOFRAN) 4 MG tablet   Oral   Take 1 tablet (4 mg total) by mouth every 8 (eight) hours as needed for nausea.   12 tablet   0   . pramipexole (MIRAPEX) 0.125 MG tablet   Oral   Take 0.375 mg by mouth at bedtime.          Marland Kitchen PRESCRIPTION MEDICATION   Topical   Apply 1 application topically daily. Silver sulfadiazine / triamcinolone cream= compounded by [facility]         . tacrolimus (PROTOPIC) 0.1 % ointment   Topical   Apply 1 application topically daily.          Marland Kitchen tiZANidine (ZANAFLEX) 2 MG tablet   Oral   Take 2 mg by mouth every 6 (six) hours as needed. For muscle spasms         . triamcinolone cream (KENALOG) 0.1 %   Topical   Apply 1 application topically daily.          Marland Kitchen warfarin (COUMADIN) 5 MG tablet   Oral   Take 5 mg by mouth every morning.           BP 148/93  Pulse 63  Temp(Src) 98.2 F (36.8 C) (Oral)  Resp 16  SpO2 95%  LMP 11/22/1996 Physical Exam  Nursing note and vitals reviewed. Constitutional: She is oriented to person, place, and time. She appears well-developed and well-nourished.  HENT:  Head: Normocephalic.  Mouth/Throat: Oropharynx is clear and moist. No oropharyngeal exudate.  Eyes: Conjunctivae and EOM are normal. Pupils are equal, round, and reactive to light.  Neck:  Normal range of motion. Neck supple.  Cardiovascular: Normal rate, regular rhythm, normal heart sounds and intact distal pulses.  Exam reveals no  gallop and no friction rub.   No murmur heard. Pulmonary/Chest: Effort normal and breath sounds normal. No respiratory distress. She has no wheezes.  Abdominal: Soft. Bowel sounds are normal. There is no tenderness. There is no rebound and no guarding.  Musculoskeletal: Normal range of motion. She exhibits tenderness (diffuse mild ttp of lumbar spine and paraspinal lumbar area). She exhibits no edema.  Neurological: She is alert and oriented to person, place, and time. She has normal strength.  Reflex Scores:      Patellar reflexes are 1+ on the right side and 1+ on the left side.      Achilles reflexes are 1+ on the right side and 1+ on the left side. No clonus in bilateral LE's. 2+ distal pulses. Mild altered sensation to light touch in bilateral lower extremities diffusely which is unchanged from baseline.   Pt is ambulatory.  Normal strength in bilateral LE's.   Skin: Skin is warm and dry.  Psychiatric: She has a normal mood and affect. Her behavior is normal.    ED Course  Procedures (including critical care time) Labs Review Labs Reviewed - No data to display Imaging Review No results found.  MDM   1. Back pain    10:23 PM 72 y.o. female with a history of chronic back pain who presents with ongoing lumbar pain. The patient states that she is not able to tolerate po medicines at home due to GI upset, these including NSAIDs and tramadol and narcotics. She denies any new neurologic changes and states that her symptoms are at baseline. She has a lumbar myelogram scheduled for this Friday. She is afebrile and vital signs are unremarkable here. She denies any back pain red flags. Will get pain control with I&O dilaudid and have her followup this Friday for her imaging study.   11:22 PM: Pt feeling much better. I have discussed the  diagnosis/risks/treatment options with the patient and believe the pt to be eligible for discharge home to follow-up at her imaging this friday. We also discussed returning to the ED immediately if new or worsening sx occur. We discussed the sx which are most concerning (e.g., worsening pain, bp red flags discussed) that necessitate immediate return. Any new prescriptions provided to the patient are listed below.  New Prescriptions   No medications on file     Junius Argyle, MD 06/10/13 1237

## 2013-06-09 NOTE — Patient Instructions (Addendum)
1. Take a lovenox injection on Wednesday September 17. 2. Do not take the lovenox on Thursday or Friday 3. Start back on your coumadin Friday night 4. Take lovenox shots on Saturday, Sunday, Monday 5. Come to Dr Reece Agar office on Tuesday September 23 to check coumadin level - we will let you know if you can stop the shots 6.  Call for any problems [phone]

## 2013-06-09 NOTE — Patient Instructions (Signed)
Do not take coumadin until after your procedure on 06/12/13. On 06/12/13, continue Coumadin 5mg  every day.   Recheck INR on 07/07/13: lab at 11am and coumadin clinic at 11:15am.

## 2013-06-10 NOTE — Progress Notes (Signed)
Hematology and Oncology Follow Up Visit  SHYANE FOSSUM [id number] 09-16-1941 72 y.o. 06/10/2013 9:41 AM   Principle Diagnosis: Encounter Diagnoses  Name Primary?  . Heterozygous factor V Leiden mutation   . PULMONARY EMBOLISM Yes     Interim History:   Followup visit for this 72 year old woman with multiple medical problems. She was initially referred here for advice on long-term anticoagulation. She sustained an unprovoked pulmonary embolism April 2011. She was found to be a heterozygote for factor V Leiden gene mutation. Other things being equal, I would not have recommended lifelong anticoagulation but she has a number of other significant risk factors including extensive varicose veins of her bilateral lower extremities, hypertension, and obesity. She requested that we monitor her Coumadin through our office. She has been on 5 mg with low therapeutic INRs which have been stable since her first visit here in February of 2012. She's had multiple hospital admissions over the last 2 years including in May 2012  admission for syncope, a January 2014 admission for hemorrhage from a right leg varicose vein, another admission in March, 2014 with hematochezia, dizziness and presyncope. I have not been able to find documentation of endoscopy studies. She was admitted again on July 7 with increasing lower extremity pain and dyspnea on exertion. Doppler studies negative for recurrent thrombosis. She was found to have a cellulitis and treated with parenteral antibiotics. She was recently evaluated by Dr.Athar, neurology in followup of a sleep apnea and restless leg syndrome. Mirapex which she had taken in the past was resumed.  Our office received a call last week asking for clearance for a myelogram to be done that way first [facility] in [place] by Dr. Leeanne Rio at their spine Center. I provided them with recommendations about bridging her anticoagulation and we will manage the  outpatient component.  She reports increasing severe back pain and now intermittent paresthesias of her lower extremities. She is not satisfied with just getting palliative pain medication and wants to find out the reason why she is having the pain to see if there is something more definitive that can be done.    Medications: reviewed  Allergies:  Allergies  Allergen Reactions  . Tramadol Other (See Comments)    Makes me hurt more-acid reflux  . Vicodin [Hydrocodone-Acetaminophen] Other (See Comments)    Makes me hurt-acid reflux    Review of Systems: Constitutional:   Generalized fatigue and weakness HEENT no sore throat Respiratory: No cough or dyspnea Cardiovascular:  No chest pain or palpitations Gastrointestinal: No abdominal pain or change in bowel habit Genito-Urinary: Not questioned Musculoskeletal: See above Neurologic: No headache or change in vision, no focal weakness. Lower extremity paresthesias as noted Skin: Recent diagnosis of psoriasis evaluated at [facility] Remaining ROS negative.     Physical Exam: Blood pressure 118/71, pulse 56, temperature 98.5 F (36.9 C), temperature source Oral, resp. rate 20, height 5\' 5"  (1.651 m), weight 236 lb 8 oz (107.276 kg), last menstrual period 11/22/1996. Wt Readings from Last 3 Encounters:  06/09/13 236 lb 8 oz (107.276 kg)  04/08/13 240 lb (108.863 kg)  04/08/13 247 lb (112.038 kg)     General appearance: Obese African American woman HENNT: Pharynx no erythema, exudate, or mass. No thyromegaly. Lymph nodes: No cervical, supraclavicular, or axillary adenopathy Breasts: Lungs: Clear to auscultation resonant to percussion Heart: Regular rhythm no murmur Abdomen: Soft, obese, nontender Extremities: Trace to 1+ edema extremities no calf tenderness Musculoskeletal: No joint deformities GU: Vascular:  Peripheral pulses dorsalis pedis 2+ symmetric, no carotid bruits Neurologic: Motor strength is 5 over 5.  Reflexes absent but symmetric at the knees. 1+ symmetric at the biceps. Sensation intact to touch. Skin: No rash or ecchymosis at present  Lab Results: Lab Results  Component Value Date   WBC 6.7 06/09/2013   HGB 12.6 06/09/2013   HCT 38.3 06/09/2013   MCV 95.0 06/09/2013   PLT 306 06/09/2013     Chemistry      Component Value Date/Time   NA 141 05/12/2013 1028   NA 137 05/04/2013 1132   K 4.4 05/12/2013 1028   K 4.1 05/04/2013 1132   CL 98 05/04/2013 1132   CO2 31* 05/12/2013 1028   CO2 29 05/04/2013 1132   BUN 13.0 05/12/2013 1028   BUN 16 05/04/2013 1132   CREATININE 1.0 05/12/2013 1028   CREATININE 0.77 05/04/2013 1132   CREATININE 0.91 11/08/2010 1518      Component Value Date/Time   CALCIUM 9.9 05/12/2013 1028   CALCIUM 10.0 05/04/2013 1132   ALKPHOS 98 05/12/2013 1028   ALKPHOS 101 03/30/2013 2045   AST 21 05/12/2013 1028   AST 17 03/30/2013 2045   ALT 21 05/12/2013 1028   ALT 14 03/30/2013 2045   BILITOT 0.27 05/12/2013 1028   BILITOT 0.3 03/30/2013 2045       Radiological Studies: No results found.  Impression: #1. 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 Unprovoked pulmonary embolus April 2011 partially related to above with additional risk factors for clotting.  #3. Chronic anticoagulation secondary to #1 and 2.  #4. History of GI bleed. Endoscopy studies negative by history but no documentation in the chart record  #5. Essential hypertension  #6. Sleep apnea syndrome  #7. Extensive varicose veins.  #8. Chronic normochromic anemia  #9. Progressive back pain Myelogram is scheduled for Friday, September 19. Recommendations on anticoagulation by phone to the spine Center at [facility] and documented for the patient today: She was advised to stop Coumadin for 5 days in anticipation of the procedure. She has been off Coumadin for 2 days. INR today is 2.0. I have prescribed Lovenox 1.5 mg per kilogram total dose 150 mg.  She will take a single dose on Wednesday, September 17. Skip doses on day before and day of procedure and resume after procedure. Continue until Coumadin again therapeutic. Resume Coumadin on the night of procedure. Report to our office for a followup visit and PT/ INR check on Tuesday, September 23     CC:Marland Kitchen    Levert Feinstein, MD 9/17/20149:41 AM

## 2013-06-11 ENCOUNTER — Telehealth: Payer: Self-pay | Admitting: *Deleted

## 2013-06-11 NOTE — Telephone Encounter (Signed)
sw pt gv appt 06/16/13 @ 2:45pm. Pt is aware...td

## 2013-06-13 ENCOUNTER — Encounter (HOSPITAL_COMMUNITY): Payer: Self-pay | Admitting: *Deleted

## 2013-06-13 ENCOUNTER — Emergency Department (HOSPITAL_COMMUNITY)
Admission: EM | Admit: 2013-06-13 | Discharge: 2013-06-13 | Disposition: A | Discharge status: Home / Self Care | Payer: PRIVATE HEALTH INSURANCE | Attending: Emergency Medicine | Admitting: Emergency Medicine

## 2013-06-13 DIAGNOSIS — F411 Generalized anxiety disorder: Secondary | ICD-10-CM | POA: Insufficient documentation

## 2013-06-13 DIAGNOSIS — E785 Hyperlipidemia, unspecified: Secondary | ICD-10-CM | POA: Insufficient documentation

## 2013-06-13 DIAGNOSIS — L408 Other psoriasis: Secondary | ICD-10-CM | POA: Insufficient documentation

## 2013-06-13 DIAGNOSIS — IMO0002 Reserved for concepts with insufficient information to code with codable children: Secondary | ICD-10-CM | POA: Insufficient documentation

## 2013-06-13 DIAGNOSIS — F329 Major depressive disorder, single episode, unspecified: Secondary | ICD-10-CM | POA: Insufficient documentation

## 2013-06-13 DIAGNOSIS — F209 Schizophrenia, unspecified: Secondary | ICD-10-CM | POA: Insufficient documentation

## 2013-06-13 DIAGNOSIS — F3289 Other specified depressive episodes: Secondary | ICD-10-CM | POA: Insufficient documentation

## 2013-06-13 DIAGNOSIS — Z7901 Long term (current) use of anticoagulants: Secondary | ICD-10-CM | POA: Insufficient documentation

## 2013-06-13 DIAGNOSIS — E669 Obesity, unspecified: Secondary | ICD-10-CM | POA: Insufficient documentation

## 2013-06-13 DIAGNOSIS — Z79899 Other long term (current) drug therapy: Secondary | ICD-10-CM | POA: Insufficient documentation

## 2013-06-13 DIAGNOSIS — K219 Gastro-esophageal reflux disease without esophagitis: Secondary | ICD-10-CM | POA: Insufficient documentation

## 2013-06-13 DIAGNOSIS — I10 Essential (primary) hypertension: Secondary | ICD-10-CM | POA: Insufficient documentation

## 2013-06-13 DIAGNOSIS — J45909 Unspecified asthma, uncomplicated: Secondary | ICD-10-CM | POA: Insufficient documentation

## 2013-06-13 DIAGNOSIS — M6281 Muscle weakness (generalized): Secondary | ICD-10-CM | POA: Insufficient documentation

## 2013-06-13 DIAGNOSIS — G8929 Other chronic pain: Secondary | ICD-10-CM | POA: Insufficient documentation

## 2013-06-13 DIAGNOSIS — R209 Unspecified disturbances of skin sensation: Secondary | ICD-10-CM | POA: Insufficient documentation

## 2013-06-13 MED ORDER — HYDROCODONE-ACETAMINOPHEN 7.5-325 MG PO TABS: 1.0000 | ORAL_TABLET | Freq: Four times a day (QID) | ORAL | Status: DC | PRN

## 2013-06-13 MED ORDER — HYDROMORPHONE HCL PF 2 MG/ML IJ SOLN
2.0000 mg | Freq: Once | INTRAMUSCULAR | Status: AC
  Administered 2013-06-13: 2 mg via INTRAMUSCULAR
  Filled 2013-06-13: qty 1

## 2013-06-13 NOTE — ED Notes (Signed)
Pt states she was seen at [facility] yesterday for lumbar myelogram and CT lumbar spine, states she did not realize she was out of vicodin Rx, is now having back pain "all up and down spine", pain radiates down bilateral legs. States legs are numb

## 2013-06-13 NOTE — ED Provider Notes (Signed)
CSN: [id number]     Arrival date & time 06/13/13  7829 History   First MD Initiated Contact with Patient 06/13/13 [id number]     Chief Complaint  Patient presents with  . Medication Refill   (Consider location/radiation/quality/duration/timing/severity/associated sxs/prior Treatment) HPI Comments: Pt has had 5-6 years of gradual onset worsening low back pain w/ 2-3 months BLLE weakness and R anterior thigh numbness.  Symptoms unchanged today.  She would like vicodin refill until she can get it from PCP's office on Monday (2days). Had CT lumbar spine & myelogram yesterday at [facility].   Patient is a 72 y.o. female presenting with back pain. The history is provided by the patient. No language interpreter was used.  Back Pain Location:  Generalized Quality:  Aching Radiates to:  L posterior upper leg and R posterior upper leg Pain severity:  Moderate Pain is:  Same all the time Onset quality:  Unable to specify Duration: 5-6 years. Timing:  Constant Progression:  Worsening Chronicity:  Chronic Context: not emotional stress, not falling, not jumping from heights, not lifting heavy objects, not MCA, not MVA, not occupational injury, not pedestrian accident, not physical stress, not recent illness, not recent injury and not twisting   Relieved by: IM narcotics. Exacerbated by: being still. Ineffective treatments:  OTC medications Associated symptoms: numbness and weakness   Associated symptoms: no abdominal pain, no bladder incontinence, no bowel incontinence, no chest pain, no dysuria, no fever, no headaches, no pelvic pain and no perianal numbness   Associated symptoms comment:  Chronic, unchanged BLLE weakness and R anterior thigh paresthesias Weakness:    Severity:  Mild   Duration: 2-3 months.   Onset quality:  Gradual   Chronicity:  Chronic   Timing:  Constant   Progression:  Unchanged Risk factors: obesity   Risk factors: no hx of cancer     Past Medical History  Diagnosis Date   . Depression   . Schizophrenia   . Hyperlipidemia   . Hypertension   . Chronic pain     "over my whole body" (03/30/2013)  . 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 DVT (deep venous thrombosis)     per 01/17/10 d/c summary- "remote hx of dvt"?  . Iron deficiency anemia   . Glaucoma   . Hemorrhoids   . Asthma   . Anxiety   . GERD (gastroesophageal reflux disease)   . Psoriasis 04/29/2012    New onset evaluated by Dr Marylou Flesher, Dermatology, [facility] 6/13  Rx 0.1% Tacrolimus ointment  . Complication of anesthesia     "because I have sleep apnea" (03/30/2013)  . Heart murmur   . Chest pain, exertional   . Chronic bronchitis   . Sleep apnea     "waiting on my CPAP" (03/30/2013)  . Pneumonia     "a few times" (03/30/2013)  . Exertional shortness of breath     "& sometimes when laying down" (03/30/2013)  . Daily headache     "last 2 months" (03/30/2013)  . Arthritis     "joints" (03/30/2013)  . Varicose veins of legs    Past Surgical History  Procedure Laterality Date  . Spinal fusion      2004  . Carpal tunnel release Bilateral   . Tubal ligation  1973  . Dilation and curettage of uterus  1970's    "once" (03/30/2013)  . 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 Joint replacement  Family History  Problem Relation Age of Onset  . Diabetes Sister   . Colon cancer Brother 40   History  Substance Use Topics  . Smoking status: Never Smoker   . Smokeless tobacco: Never Used  . Alcohol Use: No   OB History   Grav Para Term Preterm Abortions TAB SAB Ect Mult Living                 Review of Systems  Constitutional: Negative for fever, chills, diaphoresis, activity change, appetite change and fatigue.  HENT: Negative for congestion, sore throat, facial swelling, rhinorrhea, neck pain and neck stiffness.   Eyes: Negative for photophobia and discharge.  Respiratory: Negative for cough, chest tightness and shortness of breath.   Cardiovascular: Negative  for chest pain, palpitations and leg swelling.  Gastrointestinal: Negative for nausea, vomiting, abdominal pain, diarrhea and bowel incontinence.  Endocrine: Negative for polydipsia and polyuria.  Genitourinary: Negative for bladder incontinence, dysuria, frequency, difficulty urinating and pelvic pain.  Musculoskeletal: Positive for back pain. Negative for arthralgias.  Skin: Negative for color change and wound.  Allergic/Immunologic: Negative for immunocompromised state.  Neurological: Positive for weakness and numbness. Negative for facial asymmetry and headaches.  Hematological: Does not bruise/bleed easily.  Psychiatric/Behavioral: Negative for confusion and agitation.    Allergies  Tramadol and Vicodin  Home Medications   Current Outpatient Rx  Name  Route  Sig  Dispense  Refill  . albuterol (VENTOLIN HFA) 108 (90 BASE) MCG/ACT inhaler   Inhalation   Inhale 2 puffs into the lungs 2 (two) times daily as needed for wheezing or shortness of breath.          Marland Kitchen atenolol (TENORMIN) 100 MG tablet   Oral   Take 100 mg by mouth daily.          . camphor-menthol (SARNA) lotion   Topical   Apply 1 application topically daily.         Marland Kitchen esomeprazole (NEXIUM) 40 MG capsule   Oral   Take 40 mg by mouth daily before breakfast.          . FLUoxetine (PROZAC) 20 MG capsule   Oral   Take 60 mg by mouth daily.          . fluticasone (FLOVENT HFA) 220 MCG/ACT inhaler   Inhalation   Inhale 1 puff into the lungs 2 (two) times daily.          . hydrochlorothiazide (HYDRODIURIL) 25 MG tablet   Oral   Take 1 tablet (25 mg total) by mouth daily.   30 tablet   0   . HYDROcodone-acetaminophen (NORCO) 7.5-325 MG per tablet   Oral   Take 1 tablet by mouth every 6 (six) hours as needed for pain.          Marland Kitchen lisinopril (PRINIVIL,ZESTRIL) 10 MG tablet   Oral   Take 10 mg by mouth daily.          . meclizine (ANTIVERT) 25 MG tablet   Oral   Take 25 mg by mouth 3 (three)  times daily as needed for dizziness.          . ondansetron (ZOFRAN) 4 MG tablet   Oral   Take 1 tablet (4 mg total) by mouth every 8 (eight) hours as needed for nausea.   12 tablet   0   . PRESCRIPTION MEDICATION   Topical   Apply 1 application topically daily. Silver sulfadiazine / triamcinolone cream= compounded by [facility]         .  tacrolimus (PROTOPIC) 0.1 % ointment   Topical   Apply 1 application topically daily.          Marland Kitchen triamcinolone cream (KENALOG) 0.1 %   Topical   Apply 1 application topically daily.          Marland Kitchen HYDROcodone-acetaminophen (NORCO) 7.5-325 MG per tablet   Oral   Take 1 tablet by mouth every 6 (six) hours as needed for pain.   8 tablet   0   . warfarin (COUMADIN) 5 MG tablet   Oral   Take 5 mg by mouth every morning.           BP 109/62  Pulse 67  Temp(Src) 99 F (37.2 C) (Oral)  Resp 18  SpO2 98%  LMP 11/22/1996 Physical Exam  Constitutional: She is oriented to person, place, and time. She appears well-developed and well-nourished. No distress.  HENT:  Head: Normocephalic and atraumatic.  Mouth/Throat: No oropharyngeal exudate.  Eyes: Pupils are equal, round, and reactive to light.  Neck: Normal range of motion. Neck supple.  Cardiovascular: Normal rate, regular rhythm and normal heart sounds.  Exam reveals no gallop and no friction rub.   No murmur heard. Pulmonary/Chest: Effort normal and breath sounds normal. No respiratory distress. She has no wheezes. She has no rales.  Abdominal: Soft. Bowel sounds are normal. She exhibits no distension and no mass. There is no tenderness. There is no rebound and no guarding.  Musculoskeletal: Normal range of motion. She exhibits no edema and no tenderness.       Back:  Neurological: She is alert and oriented to person, place, and time. A sensory deficit is present. No cranial nerve deficit. She exhibits normal muscle tone. Coordination and gait normal. GCS eye subscore is 4. GCS verbal  subscore is 5. GCS motor subscore is 6.  Subjective decreased fine touch to anterior R thigh.   Skin: Skin is warm and dry.  Psychiatric: She has a normal mood and affect.    ED Course  Procedures (including critical care time) Labs Review Labs Reviewed - No data to display Imaging Review No results found.  FROM [facility] on 06/12/13:  CT LUMBAR SPINE WITH CONTRAST, Jun 12, 2013 11:21:49 AM  .  INDICATION: severe leg pain724.2 Low back pain radiating to both legs  COMPARISON: X-rays performed 06/12/13 and 05/26/13  .  TECHNIQUE: Thin-section axial CT images of the entire lumbar spine were acquired after intrathecal administration of iodine-based contrast. Supplemental 2D reformatted images were generated and reviewed as needed.  LEVELS IMAGED: Lower thoracic to upper sacral region  .  FINDINGS:  . Soft tissues: No edema or masses.  . Bones: Normal mineralization. No fractures.  Degenerative disease:  . T11-T12: Vacuum disc. Moderate disc bulge with associated partial effacement of ventral CSF.  . T12-L1: No significant focal abnormality.  . L1-2: No significant focal abnormality.  . L2-3: Vacuum disc, with disc bulge and mild canal stenosis. There is moderate facet hypertrophy as well as moderate hypertrophy of the ligamentum flavum.  . L3-4: Advanced facet hypertrophy, with more pronounced hypertrophy of the ligamentum flavum compared to L2-L3 level. There is moderate crowding of nerve roots within the thecal sac and moderate/advanced bilateral foraminal stenosis. Vacuum disc.  Marland Kitchen L4-5: Status post L4-L5 posterior lumbar interbody fusion (PLIF) with complete bony fusion across the L4-L5 disc space. Hardware appears intact and in similar position compared with prior imaging. There have been prior laminectomies. No stenosis of neural foramina. Thecal sac appears patulous.  Marland Kitchen  L5-S1: Vacuum disc. There is hypertrophy of the facets as well as the ligamentum flavum. There is moderate to advanced  bilateral foraminal narrowing but the nerve roots and thecal sac do not appear crowded.  . Contrast: No areas of abnormal enhancement  This study has been reviewed by a faculty neuroradiologist.   FL Lumbar Myelogram - Final result (06/12/2013 11:10 AM EDT)  Impressions  . Status post L4-L5 posterior lumbar interbody fusion with no evidence of hardware complication.  . No significant change in alignment compared to prior exam.    Narrative  LUMBOSACRAL MYELOGRAM, Jun 12, 2013 11:10:30 AM  INDICATION: severe leg pain724.2 Low back pain radiating to both legs  COMPARISON: Lumbar spine x-ray 05/26/13  TECHNIQUE: Myelography of the lumbosacral spine was performed under fluoroscopic guidance using standard aseptic technique. Informed consent was obtained and a time out was performed prior to the start of the procedure. Lumbar puncture was attempted unsuccessfully at the L3-L4 level and subsequently successfully performed at L2-3 level with a 3.5 inch 20 gauge spinal needle under fluoroscopic guidance. Nonionic contrast material was injected into the thecal sac. Multiple views of the lumbosacral spine were then obtained. No immediate complications of the procedure were observed. Dr. Laural Benes was present for and personally supervised all critical portions of the procedure.  FINDINGS:  . There was free flow of contrast within the thecal sac with changes in table positioning and with patient in upright flexion and extension positions.  . Status post L4-L5 posterior lumbar interbody fusion (PLIF). Hardware appears intact and in similar position compared to prior imaging. No abnormal motion with flexion or extension.  . There is minimal retrolisthesis of L2 on L3, unchanged with flexion or extension.  . Mild disc bulging at multiple levels with no significant spinal canal stenosis.      MDM   1. Chronic low back pain    Pt is a 72 y.o. female with Pmhx as above who presents with chronic low back pain and  request for a vicodin refill.  Pt seen in ED yesterday for pain, was given IM dilaudid with relieve.  She had a lumbar myelogram/lumbar CT at [facility] yesterday with no significant change in alignment, free flow on contrast, multi level disc bulging w/o significant spinal stenosis.  Pt reports being seen in [facility] ED< given pain shot, but did not want Rx b/c she thought she had a Vicodin refill.  ED note confirms no narcotic rx given.  Pain unchanged today including unchanged chronic R thigh paresthesias and BL decreased LE strength reported.  No bowel/bladder incontinence, fever.  I doubt acute cord compression/cauda equina, spinal epidural abscess or fracture in past 24 hours. Have given IM dilaudid, and will refill vicodin for 2 days until pt can call PCP office on Monday.  Return precautions given for new or worsening symptoms   1. Chronic low back pain         Shanna Cisco, MD 06/13/13 1551

## 2013-06-16 ENCOUNTER — Ambulatory Visit: Payer: PRIVATE HEALTH INSURANCE | Admitting: Oncology

## 2013-06-16 ENCOUNTER — Ambulatory Visit (HOSPITAL_BASED_OUTPATIENT_CLINIC_OR_DEPARTMENT_OTHER): Payer: PRIVATE HEALTH INSURANCE | Admitting: Lab

## 2013-06-16 DIAGNOSIS — I2699 Other pulmonary embolism without acute cor pulmonale: Secondary | ICD-10-CM

## 2013-06-16 DIAGNOSIS — D6851 Activated protein C resistance: Secondary | ICD-10-CM

## 2013-06-16 DIAGNOSIS — D6859 Other primary thrombophilia: Secondary | ICD-10-CM

## 2013-06-16 LAB — CBC WITH DIFFERENTIAL/PLATELET
BASO%: 0.2 % (ref 0.0–2.0)
Basophils Absolute: 0 10*3/uL (ref 0.0–0.1)
Eosinophils Absolute: 0.1 10*3/uL (ref 0.0–0.5)
HCT: 34.7 % — ABNORMAL LOW (ref 34.8–46.6)
LYMPH%: 17.7 % (ref 14.0–49.7)
MCHC: 33.7 g/dL (ref 31.5–36.0)
MCV: 95.1 fL (ref 79.5–101.0)
MONO#: 0.3 10*3/uL (ref 0.1–0.9)
NEUT#: 4.7 10*3/uL (ref 1.5–6.5)
NEUT%: 75.8 % (ref 38.4–76.8)
RBC: 3.65 10*6/uL — ABNORMAL LOW (ref 3.70–5.45)
WBC: 6.2 10*3/uL (ref 3.9–10.3)
lymph#: 1.1 10*3/uL (ref 0.9–3.3)
nRBC: 0 % (ref 0–0)

## 2013-06-16 LAB — PROTIME-INR: INR: 1.1 — ABNORMAL LOW (ref 2.00–3.50)

## 2013-06-17 ENCOUNTER — Telehealth: Payer: Self-pay | Admitting: Pharmacist

## 2013-06-17 NOTE — Telephone Encounter (Signed)
I s/w Khristi yesterday over the phone & found out she is going to the spine center at [facility] (ph # [phone]) on Fri 06/19/13 as follow up from her spinal procedure at [facility] last Fri 06/12/13. She has been off of her Coumadin for the spinal procedure. She took her "shots" Friday, Sat, Sun & Monday.   She has 3 Lovenox 150 mg syringes left at her house.  She got them from [facility] at [facility]. Her INR = 1.1 yesterday (drawn at [facility]).  She could not stay because her back was hurting.  She was due to see Dr. Cyndie Chime as well but left before being seen. She was instructed to restart her Coumadin (5 mg daily) and stay on Lovenox until INR at goal. I called the spine center at [facility] & s/w nurse named Claris Che who will set up for Dorri to have her INR drawn at the spine center on Fri 06/19/13. We will call Alaysha w/ results.  If her INR is still low then, we will need to call in more Lovenox for her. Rayelynn is aware of the plans above. Ebony Hail, Pharm.D., CPP 06/17/2013@3 :52 PM

## 2013-06-19 ENCOUNTER — Ambulatory Visit (HOSPITAL_BASED_OUTPATIENT_CLINIC_OR_DEPARTMENT_OTHER): Payer: PRIVATE HEALTH INSURANCE

## 2013-06-19 ENCOUNTER — Telehealth: Payer: Self-pay | Admitting: Oncology

## 2013-06-19 ENCOUNTER — Ambulatory Visit (HOSPITAL_BASED_OUTPATIENT_CLINIC_OR_DEPARTMENT_OTHER): Payer: PRIVATE HEALTH INSURANCE | Admitting: Pharmacist

## 2013-06-19 ENCOUNTER — Other Ambulatory Visit: Payer: Self-pay | Admitting: *Deleted

## 2013-06-19 DIAGNOSIS — Z7901 Long term (current) use of anticoagulants: Secondary | ICD-10-CM

## 2013-06-19 DIAGNOSIS — D6859 Other primary thrombophilia: Secondary | ICD-10-CM

## 2013-06-19 DIAGNOSIS — I2699 Other pulmonary embolism without acute cor pulmonale: Secondary | ICD-10-CM

## 2013-06-19 DIAGNOSIS — D6851 Activated protein C resistance: Secondary | ICD-10-CM

## 2013-06-19 LAB — PROTIME-INR: Protime: 21.6 Seconds — ABNORMAL HIGH (ref 10.6–13.4)

## 2013-06-19 LAB — POCT INR: INR: 1.8

## 2013-06-19 NOTE — Progress Notes (Signed)
INR increasing towards goal (1.8) today. Pt is doing well. She reports minor bruising at lovenox injection sites. She started her 5 mg daily up coumadin back on Friday 9/19 and continues on Lovenox. She has 1 injection left (she took an injection this morning). [name] [name] was to get her INR checked today at the [facility] spine center but came her instead. She was seen in the coumadin clinic. She reports no medication changes. Since her INR is almost to goal. Will have [name] [name] take her last lovenox shot tomorrow then she can stop the lovenox injections I will boost her coumadin today: She will take 1.5 tablets (7.5 mg) tonight. Then tomorrow return to 1 tablet daily Recheck INR on 07/07/13: lab at 11am and coumadin clinic at 11:15am.

## 2013-06-19 NOTE — Patient Instructions (Addendum)
INR almost to goal.  Take 1.5 tablets of coumadin (7.5 mg) tonight.  Then tomorrow take last lovenox shot  and return to 1 tablet daily starting tomorrow Recheck INR on 07/07/13: lab at 11am and coumadin clinic at 11:15am.

## 2013-06-19 NOTE — Telephone Encounter (Signed)
Pt came by today added labs today and coumadin appt per pharmacy

## 2013-06-28 ENCOUNTER — Other Ambulatory Visit: Payer: Self-pay | Admitting: Oncology

## 2013-06-28 DIAGNOSIS — Z7901 Long term (current) use of anticoagulants: Secondary | ICD-10-CM

## 2013-06-28 DIAGNOSIS — I2699 Other pulmonary embolism without acute cor pulmonale: Secondary | ICD-10-CM

## 2013-07-01 ENCOUNTER — Telehealth: Payer: Self-pay | Admitting: Oncology

## 2013-07-04 ENCOUNTER — Encounter (HOSPITAL_COMMUNITY): Payer: Self-pay | Admitting: Emergency Medicine

## 2013-07-04 ENCOUNTER — Emergency Department (HOSPITAL_COMMUNITY)
Admission: EM | Admit: 2013-07-04 | Discharge: 2013-07-04 | Disposition: A | Discharge status: Home / Self Care | Payer: PRIVATE HEALTH INSURANCE | Attending: Emergency Medicine | Admitting: Emergency Medicine

## 2013-07-04 DIAGNOSIS — Z86711 Personal history of pulmonary embolism: Secondary | ICD-10-CM | POA: Insufficient documentation

## 2013-07-04 DIAGNOSIS — J45909 Unspecified asthma, uncomplicated: Secondary | ICD-10-CM | POA: Insufficient documentation

## 2013-07-04 DIAGNOSIS — Z86718 Personal history of other venous thrombosis and embolism: Secondary | ICD-10-CM | POA: Insufficient documentation

## 2013-07-04 DIAGNOSIS — G8929 Other chronic pain: Secondary | ICD-10-CM | POA: Insufficient documentation

## 2013-07-04 DIAGNOSIS — R51 Headache: Secondary | ICD-10-CM | POA: Insufficient documentation

## 2013-07-04 DIAGNOSIS — G4730 Sleep apnea, unspecified: Secondary | ICD-10-CM | POA: Insufficient documentation

## 2013-07-04 DIAGNOSIS — Z872 Personal history of diseases of the skin and subcutaneous tissue: Secondary | ICD-10-CM | POA: Insufficient documentation

## 2013-07-04 DIAGNOSIS — I10 Essential (primary) hypertension: Secondary | ICD-10-CM | POA: Insufficient documentation

## 2013-07-04 DIAGNOSIS — IMO0002 Reserved for concepts with insufficient information to code with codable children: Secondary | ICD-10-CM | POA: Insufficient documentation

## 2013-07-04 DIAGNOSIS — K219 Gastro-esophageal reflux disease without esophagitis: Secondary | ICD-10-CM | POA: Insufficient documentation

## 2013-07-04 DIAGNOSIS — M549 Dorsalgia, unspecified: Secondary | ICD-10-CM | POA: Insufficient documentation

## 2013-07-04 DIAGNOSIS — F209 Schizophrenia, unspecified: Secondary | ICD-10-CM | POA: Insufficient documentation

## 2013-07-04 DIAGNOSIS — Z7901 Long term (current) use of anticoagulants: Secondary | ICD-10-CM | POA: Insufficient documentation

## 2013-07-04 DIAGNOSIS — Z87828 Personal history of other (healed) physical injury and trauma: Secondary | ICD-10-CM | POA: Insufficient documentation

## 2013-07-04 DIAGNOSIS — R011 Cardiac murmur, unspecified: Secondary | ICD-10-CM | POA: Insufficient documentation

## 2013-07-04 DIAGNOSIS — Z862 Personal history of diseases of the blood and blood-forming organs and certain disorders involving the immune mechanism: Secondary | ICD-10-CM | POA: Insufficient documentation

## 2013-07-04 DIAGNOSIS — I8390 Asymptomatic varicose veins of unspecified lower extremity: Secondary | ICD-10-CM | POA: Insufficient documentation

## 2013-07-04 DIAGNOSIS — F329 Major depressive disorder, single episode, unspecified: Secondary | ICD-10-CM | POA: Insufficient documentation

## 2013-07-04 DIAGNOSIS — Z79899 Other long term (current) drug therapy: Secondary | ICD-10-CM | POA: Insufficient documentation

## 2013-07-04 DIAGNOSIS — Z8701 Personal history of pneumonia (recurrent): Secondary | ICD-10-CM | POA: Insufficient documentation

## 2013-07-04 DIAGNOSIS — F411 Generalized anxiety disorder: Secondary | ICD-10-CM | POA: Insufficient documentation

## 2013-07-04 DIAGNOSIS — F3289 Other specified depressive episodes: Secondary | ICD-10-CM | POA: Insufficient documentation

## 2013-07-04 DIAGNOSIS — M129 Arthropathy, unspecified: Secondary | ICD-10-CM | POA: Insufficient documentation

## 2013-07-04 DIAGNOSIS — Z791 Long term (current) use of non-steroidal anti-inflammatories (NSAID): Secondary | ICD-10-CM | POA: Insufficient documentation

## 2013-07-04 DIAGNOSIS — B741 Filariasis due to Brugia malayi: Secondary | ICD-10-CM | POA: Insufficient documentation

## 2013-07-04 MED ORDER — HYDROMORPHONE HCL PF 1 MG/ML IJ SOLN
1.0000 mg | Freq: Once | INTRAMUSCULAR | Status: AC
  Administered 2013-07-04: 1 mg via INTRAMUSCULAR
  Filled 2013-07-04: qty 1

## 2013-07-04 NOTE — ED Notes (Signed)
Reports recently having steriod inj in her back and having a headache since then, also having pain to bilateral legs due to varicose veins.

## 2013-07-04 NOTE — ED Notes (Signed)
MD at bedside. 

## 2013-07-04 NOTE — ED Provider Notes (Signed)
CSN: [id number]     Arrival date & time 07/04/13  1334 History   First MD Initiated Contact with Patient 07/04/13 1434     Chief Complaint  Patient presents with  . Headache  . Leg Pain   (Consider location/radiation/quality/duration/timing/severity/associated sxs/prior Treatment) Patient is a 72 y.o. female presenting with leg pain. The history is provided by the patient.  Leg Pain Location:  Leg Injury: no   Leg location:  L leg and R leg Pain details:    Quality:  Aching, pressure and throbbing   Radiates to:  Does not radiate   Severity:  Moderate   Onset quality:  Gradual   Timing:  Constant   Progression:  Unchanged Chronicity:  Chronic Relieved by:  Nothing Worsened by:  Nothing tried Ineffective treatments:  None tried Associated symptoms: back pain   Associated symptoms: no fatigue, no fever, no muscle weakness and no swelling   Risk factors comment:  Patient has a long history of back injury, steroid injections, varicose veins and psoriasis.   Past Medical History  Diagnosis Date  . Depression   . Schizophrenia   . Hyperlipidemia   . Hypertension   . Chronic pain     "over my whole body" (03/30/2013)  . 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 DVT (deep venous thrombosis)     per 01/17/10 d/c summary- "remote hx of dvt"?  . Iron deficiency anemia   . Glaucoma   . Hemorrhoids   . Asthma   . Anxiety   . GERD (gastroesophageal reflux disease)   . Psoriasis 04/29/2012    New onset evaluated by Dr Marylou Flesher, Dermatology, [facility] 6/13  Rx 0.1% Tacrolimus ointment  . Complication of anesthesia     "because I have sleep apnea" (03/30/2013)  . Heart murmur   . Chest pain, exertional   . Chronic bronchitis   . Sleep apnea     "waiting on my CPAP" (03/30/2013)  . Pneumonia     "a few times" (03/30/2013)  . Exertional shortness of breath     "& sometimes when laying down" (03/30/2013)  . Daily headache     "last 2 months" (03/30/2013)  .  Arthritis     "joints" (03/30/2013)  . Varicose veins of legs   . Psoriasis    Past Surgical History  Procedure Laterality Date  . Spinal fusion      2004  . Carpal tunnel release Bilateral   . Tubal ligation  1973  . Dilation and curettage of uterus  1970's    "once" (03/30/2013)  . 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 Joint replacement     Family History  Problem Relation Age of Onset  . Diabetes Sister   . Colon cancer Brother 40   History  Substance Use Topics  . Smoking status: Never Smoker   . Smokeless tobacco: Never Used  . Alcohol Use: No   OB History   Grav Para Term Preterm Abortions TAB SAB Ect Mult Living                 Review of Systems  Constitutional: Negative for fever and fatigue.  Musculoskeletal: Positive for back pain.  All other systems reviewed and are negative.    Allergies  Tramadol  Home Medications   Current Outpatient Rx  Name  Route  Sig  Dispense  Refill  . albuterol (VENTOLIN HFA) 108 (90 BASE) MCG/ACT inhaler   Inhalation  Inhale 2 puffs into the lungs 2 (two) times daily as needed for wheezing or shortness of breath.          Marland Kitchen atenolol (TENORMIN) 100 MG tablet   Oral   Take 100 mg by mouth daily.          . camphor-menthol (SARNA) lotion   Topical   Apply 1 application topically daily.         Marland Kitchen esomeprazole (NEXIUM) 40 MG capsule   Oral   Take 40 mg by mouth daily before breakfast.         . FLUoxetine (PROZAC) 20 MG capsule   Oral   Take 60 mg by mouth daily.          . fluticasone (FLOVENT HFA) 220 MCG/ACT inhaler   Inhalation   Inhale 1 puff into the lungs 2 (two) times daily.          Marland Kitchen gabapentin (NEURONTIN) 100 MG capsule   Oral   Take 100 mg by mouth 3 (three) times daily.         . hydrochlorothiazide (HYDRODIURIL) 25 MG tablet   Oral   Take 1 tablet (25 mg total) by mouth daily.   30 tablet   0   . HYDROcodone-acetaminophen (NORCO) 7.5-325 MG per tablet   Oral   Take 1 tablet  by mouth every 6 (six) hours as needed for pain.          Marland Kitchen lisinopril (PRINIVIL,ZESTRIL) 10 MG tablet   Oral   Take 10 mg by mouth daily.          . meclizine (ANTIVERT) 25 MG tablet   Oral   Take 25 mg by mouth 3 (three) times daily as needed for dizziness.          . meloxicam (MOBIC) 15 MG tablet   Oral   Take 15 mg by mouth daily.         . Nutritional Supplements (ESTROVEN ENERGY) TABS   Oral   Take 1 tablet by mouth daily.         Marland Kitchen PRESCRIPTION MEDICATION   Topical   Apply 1 application topically daily. Silver sulfadiazine / triamcinolone cream= [facility]ounded by [facility] For psoriasis         . warfarin (COUMADIN) 5 MG tablet   Oral   Take 5 mg by mouth daily.           BP 141/92  Pulse 62  Temp(Src) 98.1 F (36.7 C) (Oral)  Resp 18  SpO2 97%  LMP 11/22/1996 Physical Exam  Nursing note and vitals reviewed. Constitutional: She is oriented to person, place, and time. She appears well-developed and well-nourished. No distress.  HENT:  Head: Normocephalic and atraumatic.  Mouth/Throat: Oropharynx is clear and moist.  Eyes: Conjunctivae and EOM are normal. Pupils are equal, round, and reactive to light.  Neck: Normal range of motion. Neck supple.  Cardiovascular: Normal rate, regular rhythm and intact distal pulses.   No murmur heard. Pulmonary/Chest: Effort normal and breath sounds normal. No respiratory distress. She has no wheezes. She has no rales.  Abdominal: Soft. She exhibits no distension. There is no tenderness. There is no rebound and no guarding.  Musculoskeletal: Normal range of motion. She exhibits no edema and no tenderness.  Severe varicose veins bilaterally that are soft, distended and nontender. No significant edema present in the lower extremity  Neurological: She is alert and oriented to person, place, and time.  Skin: Skin is warm  and dry. No rash noted. No erythema.  Psychiatric: She has a normal mood and affect. Her behavior is normal.     ED Course  Procedures (including critical care time) Labs Review Labs Reviewed - No data to display Imaging Review No results found.  EKG Interpretation   None       MDM  No diagnosis found.  Patient here for chronic pain in the legs from her varicose veins and psoriasis was run out of her pain medication early and requesting an IM dose of medication until she can get her next prescription filled. Patient is neurovascularly intact no signs of infection, superficial thrombophlebitis or DVT at this time. Patient given 1 mg of IM Dilaudid and discharged home.    Gwyneth Sprout, MD 07/04/13 1456

## 2013-07-06 ENCOUNTER — Institutional Professional Consult (permissible substitution): Payer: 59 | Admitting: Neurology

## 2013-07-07 ENCOUNTER — Other Ambulatory Visit: Payer: PRIVATE HEALTH INSURANCE | Admitting: Lab

## 2013-07-07 ENCOUNTER — Ambulatory Visit: Payer: PRIVATE HEALTH INSURANCE

## 2013-07-07 ENCOUNTER — Telehealth: Payer: Self-pay | Admitting: Pharmacist

## 2013-07-07 NOTE — Telephone Encounter (Signed)
LVM for patient FTKA Needs to call back and r/s cc appmt

## 2013-07-14 ENCOUNTER — Telehealth: Payer: Self-pay | Admitting: Pharmacist

## 2013-07-14 NOTE — Progress Notes (Signed)
Spoke with patient.  She states that she has missed a few Coumadin appointments due to having back pain and being busy "running back and forth to [place]."  She would like to come in tomorrow afternoon for lab and Coumadin Clinic. Request for lab/CC sent to scheduler's inbasket for appt 07/15/13 lab @ 10:30, Clinic at 10:45.

## 2013-07-15 ENCOUNTER — Other Ambulatory Visit (HOSPITAL_BASED_OUTPATIENT_CLINIC_OR_DEPARTMENT_OTHER): Payer: PRIVATE HEALTH INSURANCE | Admitting: Lab

## 2013-07-15 ENCOUNTER — Ambulatory Visit (HOSPITAL_BASED_OUTPATIENT_CLINIC_OR_DEPARTMENT_OTHER): Payer: PRIVATE HEALTH INSURANCE | Admitting: Pharmacist

## 2013-07-15 DIAGNOSIS — Z7901 Long term (current) use of anticoagulants: Secondary | ICD-10-CM

## 2013-07-15 DIAGNOSIS — I2699 Other pulmonary embolism without acute cor pulmonale: Secondary | ICD-10-CM

## 2013-07-15 DIAGNOSIS — D6859 Other primary thrombophilia: Secondary | ICD-10-CM

## 2013-07-15 DIAGNOSIS — D6851 Activated protein C resistance: Secondary | ICD-10-CM

## 2013-07-15 LAB — PROTIME-INR: INR: 2.3 (ref 2.00–3.50)

## 2013-07-15 LAB — POCT INR: INR: 2.3

## 2013-07-15 NOTE — Progress Notes (Signed)
INR = 2.3 Pt continues on Coumadin 5 mg daily She states she is going to be seen at the Pain Clinic at [facility] soon.   Only recent med change is increased Neurontin dose. INR at goal.  No change to Coumadin dose necessary. Return in 1 month. Ebony Hail, Pharm.D., CPP 07/15/2013@11 :15 AM

## 2013-07-25 ENCOUNTER — Encounter (HOSPITAL_COMMUNITY): Payer: Self-pay | Admitting: Emergency Medicine

## 2013-07-25 ENCOUNTER — Emergency Department (HOSPITAL_COMMUNITY)
Admission: EM | Admit: 2013-07-25 | Discharge: 2013-07-25 | Disposition: A | Discharge status: Home / Self Care | Payer: PRIVATE HEALTH INSURANCE | Attending: Emergency Medicine | Admitting: Emergency Medicine

## 2013-07-25 DIAGNOSIS — K219 Gastro-esophageal reflux disease without esophagitis: Secondary | ICD-10-CM | POA: Insufficient documentation

## 2013-07-25 DIAGNOSIS — F329 Major depressive disorder, single episode, unspecified: Secondary | ICD-10-CM | POA: Insufficient documentation

## 2013-07-25 DIAGNOSIS — G8929 Other chronic pain: Secondary | ICD-10-CM

## 2013-07-25 DIAGNOSIS — Z862 Personal history of diseases of the blood and blood-forming organs and certain disorders involving the immune mechanism: Secondary | ICD-10-CM | POA: Insufficient documentation

## 2013-07-25 DIAGNOSIS — I10 Essential (primary) hypertension: Secondary | ICD-10-CM | POA: Insufficient documentation

## 2013-07-25 DIAGNOSIS — F209 Schizophrenia, unspecified: Secondary | ICD-10-CM | POA: Insufficient documentation

## 2013-07-25 DIAGNOSIS — Z86711 Personal history of pulmonary embolism: Secondary | ICD-10-CM | POA: Insufficient documentation

## 2013-07-25 DIAGNOSIS — Z872 Personal history of diseases of the skin and subcutaneous tissue: Secondary | ICD-10-CM | POA: Insufficient documentation

## 2013-07-25 DIAGNOSIS — R42 Dizziness and giddiness: Secondary | ICD-10-CM | POA: Insufficient documentation

## 2013-07-25 DIAGNOSIS — Z8639 Personal history of other endocrine, nutritional and metabolic disease: Secondary | ICD-10-CM | POA: Insufficient documentation

## 2013-07-25 DIAGNOSIS — J45901 Unspecified asthma with (acute) exacerbation: Secondary | ICD-10-CM | POA: Insufficient documentation

## 2013-07-25 DIAGNOSIS — M129 Arthropathy, unspecified: Secondary | ICD-10-CM | POA: Insufficient documentation

## 2013-07-25 DIAGNOSIS — F3289 Other specified depressive episodes: Secondary | ICD-10-CM | POA: Insufficient documentation

## 2013-07-25 DIAGNOSIS — Z7901 Long term (current) use of anticoagulants: Secondary | ICD-10-CM | POA: Insufficient documentation

## 2013-07-25 DIAGNOSIS — Z86718 Personal history of other venous thrombosis and embolism: Secondary | ICD-10-CM | POA: Insufficient documentation

## 2013-07-25 DIAGNOSIS — F411 Generalized anxiety disorder: Secondary | ICD-10-CM | POA: Insufficient documentation

## 2013-07-25 DIAGNOSIS — Z8701 Personal history of pneumonia (recurrent): Secondary | ICD-10-CM | POA: Insufficient documentation

## 2013-07-25 DIAGNOSIS — Z79899 Other long term (current) drug therapy: Secondary | ICD-10-CM | POA: Insufficient documentation

## 2013-07-25 DIAGNOSIS — M549 Dorsalgia, unspecified: Secondary | ICD-10-CM | POA: Insufficient documentation

## 2013-07-25 DIAGNOSIS — Z791 Long term (current) use of non-steroidal anti-inflammatories (NSAID): Secondary | ICD-10-CM | POA: Insufficient documentation

## 2013-07-25 DIAGNOSIS — R011 Cardiac murmur, unspecified: Secondary | ICD-10-CM | POA: Insufficient documentation

## 2013-07-25 MED ORDER — HYDROMORPHONE HCL PF 1 MG/ML IJ SOLN
1.0000 mg | Freq: Once | INTRAMUSCULAR | Status: AC
  Administered 2013-07-25: 1 mg via INTRAMUSCULAR
  Filled 2013-07-25: qty 1

## 2013-07-25 NOTE — ED Provider Notes (Signed)
CSN: [id number]     Arrival date & time 07/25/13  0530 History   First MD Initiated Contact with Patient 07/25/13 0530     Chief Complaint  Patient presents with  . Dizziness  . Hypertension  . chroic back pain    (Consider location/radiation/quality/duration/timing/severity/associated sxs/prior Treatment) Patient is a 72 y.o. female presenting with back pain. The history is provided by the patient.  Back Pain Location:  Lumbar spine Quality:  Aching Radiates to:  Does not radiate Pain severity:  Moderate Pain is:  Same all the time Onset quality:  Gradual Timing:  Constant Progression:  Worsening Chronicity:  Chronic Context: lifting heavy objects   Relieved by:  Nothing Worsened by:  Nothing tried Associated symptoms: no fever     Past Medical History  Diagnosis Date  . Depression   . Schizophrenia   . Hyperlipidemia   . Hypertension   . Chronic pain     "over my whole body" (03/30/2013)  . 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 DVT (deep venous thrombosis)     per 01/17/10 d/c summary- "remote hx of dvt"?  . Iron deficiency anemia   . Glaucoma   . Hemorrhoids   . Asthma   . Anxiety   . GERD (gastroesophageal reflux disease)   . Psoriasis 04/29/2012    New onset evaluated by Dr Marylou Flesher, Dermatology, [facility] 6/13  Rx 0.1% Tacrolimus ointment  . Complication of anesthesia     "because I have sleep apnea" (03/30/2013)  . Heart murmur   . Chest pain, exertional   . Chronic bronchitis   . Sleep apnea     "waiting on my CPAP" (03/30/2013)  . Pneumonia     "a few times" (03/30/2013)  . Exertional shortness of breath     "& sometimes when laying down" (03/30/2013)  . Daily headache     "last 2 months" (03/30/2013)  . Arthritis     "joints" (03/30/2013)  . Varicose veins of legs   . Psoriasis    Past Surgical History  Procedure Laterality Date  . Spinal fusion      2004  . Carpal tunnel release Bilateral   . Tubal ligation  1973  .  Dilation and curettage of uterus  1970's    "once" (03/30/2013)  . 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 Joint replacement     Family History  Problem Relation Age of Onset  . Diabetes Sister   . Colon cancer Brother 40   History  Substance Use Topics  . Smoking status: Never Smoker   . Smokeless tobacco: Never Used  . Alcohol Use: No   OB History   Grav Para Term Preterm Abortions TAB SAB Ect Mult Living                 Review of Systems  Constitutional: Negative for fever.  Respiratory: Negative for cough.   Cardiovascular: Negative for leg swelling.  Musculoskeletal: Positive for back pain.  All other systems reviewed and are negative.    Allergies  Review of patient's allergies indicates no known allergies.  Home Medications   Current Outpatient Rx  Name  Route  Sig  Dispense  Refill  . albuterol (VENTOLIN HFA) 108 (90 BASE) MCG/ACT inhaler   Inhalation   Inhale 2 puffs into the lungs 2 (two) times daily as needed for wheezing or shortness of breath.          Marland Kitchen atenolol (  TENORMIN) 100 MG tablet   Oral   Take 100 mg by mouth daily.          . baclofen (LIORESAL) 10 MG tablet   Oral   Take 1 tablet by mouth 3 (three) times daily as needed (pain).          . camphor-menthol (SARNA) lotion   Topical   Apply 1 application topically daily.         . clonazePAM (KLONOPIN) 0.5 MG tablet   Oral   Take 0.5 mg by mouth 2 (two) times daily as needed for anxiety.          . clotrimazole (LOTRIMIN) 1 % cream   Topical   Apply 1 application topically 3 (three) times daily as needed (rash).          Marland Kitchen esomeprazole (NEXIUM) 40 MG capsule   Oral   Take 40 mg by mouth daily before breakfast.         . FLUoxetine (PROZAC) 20 MG capsule   Oral   Take 60 mg by mouth every morning.          . fluticasone (FLOVENT HFA) 220 MCG/ACT inhaler   Inhalation   Inhale 1 puff into the lungs 2 (two) times daily.          . hydrochlorothiazide (HYDRODIURIL)  25 MG tablet   Oral   Take 1 tablet (25 mg total) by mouth daily.   30 tablet   0   . lisinopril (PRINIVIL,ZESTRIL) 10 MG tablet   Oral   Take 10 mg by mouth daily.          . meclizine (ANTIVERT) 25 MG tablet   Oral   Take 25 mg by mouth 3 (three) times daily as needed for dizziness.          . meloxicam (MOBIC) 15 MG tablet   Oral   Take 15 mg by mouth every morning.          . montelukast (SINGULAIR) 10 MG tablet   Oral   Take 10 mg by mouth every evening.         . Nutritional Supplements (ESTROVEN ENERGY) TABS   Oral   Take 1 tablet by mouth every morning.          Marland Kitchen PRESCRIPTION MEDICATION   Topical   Apply 1 application topically daily. Silver sulfadiazine / triamcinolone cream= compounded by [facility] For psoriasis         . tacrolimus (PROTOPIC) 0.1 % ointment   Topical   Apply 1 application topically 3 (three) times daily as needed (rash).          Marland Kitchen tiZANidine (ZANAFLEX) 2 MG tablet   Oral   Take 1 tablet by mouth 3 (three) times daily as needed (pain, muscle spasm).          Marland Kitchen warfarin (COUMADIN) 5 MG tablet   Oral   Take 5 mg by mouth every morning.           BP 155/87  Pulse 62  Temp(Src) 98.6 F (37 C) (Oral)  Resp 17  SpO2 94%  LMP 11/22/1996 Physical Exam  Nursing note and vitals reviewed. Constitutional: She is oriented to person, place, and time. She appears well-developed and well-nourished. No distress.  HENT:  Head: Normocephalic and atraumatic.  Eyes: EOM are normal. Pupils are equal, round, and reactive to light.  Neck: Normal range of motion. Neck supple.  Cardiovascular: Normal rate and regular rhythm.  Exam reveals no friction rub.   No murmur heard. Pulmonary/Chest: Effort normal and breath sounds normal. No respiratory distress. She has no wheezes. She has no rales.  Abdominal: Soft. She exhibits no distension. There is no tenderness. There is no rebound.  Musculoskeletal: Normal range of motion. She exhibits no  edema.  Neurological: She is alert and oriented to person, place, and time. No cranial nerve deficit. She exhibits normal muscle tone. Coordination normal.  Skin: No rash noted. She is not diaphoretic.    ED Course  Procedures (including critical care time) Labs Review Labs Reviewed - No data to display Imaging Review No results found.  EKG Interpretation   None       MDM   1. Chronic back pain    72 year old female here with back pain. She has chronic back pain as well by pain management. She is currently being managed with tizanidine and baclofen for back pain. She is set up in a few weeks to start receiving spinal injections for her back pain. She is not currently on narcotics for back pain. She states she's been drowsy since taking the Bactrim, but this is a known side effect of Bactrim. She also stated some dizziness in triage, however she states that this is secondary to her vertigo. She took some meclizine in the dizziness has now resolved. She has a chest pain, fevers, shortness of breath. She states me she really is here just for some back pain medications. She states she is back pain but after she lifted some heavy. She is given one dose of dilaudid. I assured her to followup with her chronic pain management people for further narcotic medications. Stable for discharge.   Dagmar Hait, MD 07/25/13 [id number]

## 2013-07-25 NOTE — ED Notes (Signed)
Bed: ZO10 Expected date:  Expected time:  Means of arrival:  Comments: EMS/chronic back pain

## 2013-07-25 NOTE — ED Notes (Signed)
Per [facility] , pt. From home with complaint of dizziness, hypertension and chronic back pain @8 /10. Pt. Claimed that she's seen in the pain clinic and put on new medications  like Tizanidine and baclofen , reported that lately shes been drowsy and  Sleepy but still in pain. Alert and oriented x4.

## 2013-08-03 ENCOUNTER — Emergency Department (HOSPITAL_COMMUNITY)
Admission: EM | Admit: 2013-08-03 | Discharge: 2013-08-03 | Disposition: A | Discharge status: Home / Self Care | Payer: PRIVATE HEALTH INSURANCE | Attending: Emergency Medicine | Admitting: Emergency Medicine

## 2013-08-03 ENCOUNTER — Encounter (HOSPITAL_COMMUNITY): Payer: Self-pay | Admitting: Emergency Medicine

## 2013-08-03 DIAGNOSIS — Z7901 Long term (current) use of anticoagulants: Secondary | ICD-10-CM | POA: Insufficient documentation

## 2013-08-03 DIAGNOSIS — Z86711 Personal history of pulmonary embolism: Secondary | ICD-10-CM | POA: Insufficient documentation

## 2013-08-03 DIAGNOSIS — Z86718 Personal history of other venous thrombosis and embolism: Secondary | ICD-10-CM | POA: Insufficient documentation

## 2013-08-03 DIAGNOSIS — F209 Schizophrenia, unspecified: Secondary | ICD-10-CM | POA: Insufficient documentation

## 2013-08-03 DIAGNOSIS — F411 Generalized anxiety disorder: Secondary | ICD-10-CM | POA: Insufficient documentation

## 2013-08-03 DIAGNOSIS — K219 Gastro-esophageal reflux disease without esophagitis: Secondary | ICD-10-CM | POA: Insufficient documentation

## 2013-08-03 DIAGNOSIS — H409 Unspecified glaucoma: Secondary | ICD-10-CM | POA: Insufficient documentation

## 2013-08-03 DIAGNOSIS — G4730 Sleep apnea, unspecified: Secondary | ICD-10-CM | POA: Insufficient documentation

## 2013-08-03 DIAGNOSIS — M129 Arthropathy, unspecified: Secondary | ICD-10-CM | POA: Insufficient documentation

## 2013-08-03 DIAGNOSIS — Z862 Personal history of diseases of the blood and blood-forming organs and certain disorders involving the immune mechanism: Secondary | ICD-10-CM | POA: Insufficient documentation

## 2013-08-03 DIAGNOSIS — Z791 Long term (current) use of non-steroidal anti-inflammatories (NSAID): Secondary | ICD-10-CM | POA: Insufficient documentation

## 2013-08-03 DIAGNOSIS — I10 Essential (primary) hypertension: Secondary | ICD-10-CM | POA: Insufficient documentation

## 2013-08-03 DIAGNOSIS — Z79899 Other long term (current) drug therapy: Secondary | ICD-10-CM | POA: Insufficient documentation

## 2013-08-03 DIAGNOSIS — J45909 Unspecified asthma, uncomplicated: Secondary | ICD-10-CM | POA: Insufficient documentation

## 2013-08-03 DIAGNOSIS — IMO0002 Reserved for concepts with insufficient information to code with codable children: Secondary | ICD-10-CM | POA: Insufficient documentation

## 2013-08-03 DIAGNOSIS — R51 Headache: Secondary | ICD-10-CM | POA: Insufficient documentation

## 2013-08-03 DIAGNOSIS — Z8639 Personal history of other endocrine, nutritional and metabolic disease: Secondary | ICD-10-CM | POA: Insufficient documentation

## 2013-08-03 DIAGNOSIS — G8929 Other chronic pain: Secondary | ICD-10-CM | POA: Insufficient documentation

## 2013-08-03 DIAGNOSIS — L408 Other psoriasis: Secondary | ICD-10-CM | POA: Insufficient documentation

## 2013-08-03 DIAGNOSIS — R011 Cardiac murmur, unspecified: Secondary | ICD-10-CM | POA: Insufficient documentation

## 2013-08-03 DIAGNOSIS — R5383 Other fatigue: Secondary | ICD-10-CM

## 2013-08-03 DIAGNOSIS — Z8701 Personal history of pneumonia (recurrent): Secondary | ICD-10-CM | POA: Insufficient documentation

## 2013-08-03 DIAGNOSIS — R5381 Other malaise: Secondary | ICD-10-CM | POA: Insufficient documentation

## 2013-08-03 LAB — URINALYSIS, ROUTINE W REFLEX MICROSCOPIC
Bilirubin Urine: NEGATIVE
Hgb urine dipstick: NEGATIVE
Nitrite: NEGATIVE
Specific Gravity, Urine: 1.019 (ref 1.005–1.030)
pH: 7.5 (ref 5.0–8.0)

## 2013-08-03 LAB — CBC WITH DIFFERENTIAL/PLATELET
Basophils Relative: 0 % (ref 0–1)
Eosinophils Relative: 2 % (ref 0–5)
HCT: 35.8 % — ABNORMAL LOW (ref 36.0–46.0)
Hemoglobin: 12.2 g/dL (ref 12.0–15.0)
Lymphs Abs: 1 10*3/uL (ref 0.7–4.0)
MCH: 32 pg (ref 26.0–34.0)
MCHC: 34.1 g/dL (ref 30.0–36.0)
Monocytes Relative: 5 % (ref 3–12)
Neutro Abs: 5.2 10*3/uL (ref 1.7–7.7)
Neutrophils Relative %: 78 % — ABNORMAL HIGH (ref 43–77)
Platelets: 255 10*3/uL (ref 150–400)
RBC: 3.81 MIL/uL — ABNORMAL LOW (ref 3.87–5.11)
WBC: 6.7 10*3/uL (ref 4.0–10.5)

## 2013-08-03 LAB — URINE MICROSCOPIC-ADD ON

## 2013-08-03 LAB — BASIC METABOLIC PANEL
BUN: 11 mg/dL (ref 6–23)
Chloride: 100 mEq/L (ref 96–112)
GFR calc Af Amer: 79 mL/min — ABNORMAL LOW (ref >90)
Glucose, Bld: 107 mg/dL — ABNORMAL HIGH (ref 70–99)
Potassium: 3.6 mEq/L (ref 3.5–5.1)

## 2013-08-03 LAB — TSH: TSH: 0.669 u[IU]/mL (ref 0.350–4.500)

## 2013-08-03 MED ORDER — ACETAMINOPHEN 325 MG PO TABS
650.0000 mg | ORAL_TABLET | Freq: Once | ORAL | Status: AC
  Administered 2013-08-03: 650 mg via ORAL
  Filled 2013-08-03: qty 2

## 2013-08-03 NOTE — ED Notes (Addendum)
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 4 mg Zofran, 20 g in left forearm. Denies pain.

## 2013-08-03 NOTE — ED Provider Notes (Signed)
CSN: [id number]     Arrival date & time 08/03/13  1100 History   First MD Initiated Contact with Patient 08/03/13 1110     Chief Complaint  Patient presents with  . Dizziness    HPI  "I'm tired because I'm sleep deprived". Patient presents with very vague and nonspecific complaints. Tension she states she is very tired because she was up all night. She has an epileptic son who has a feeding tube at home. He goes to daycare" during the week. And a visiting evening home nurse. On the weekends they get home nursing in the morning. However last night he had seizure several times during the night. She didn't sleep well. She feels tired.  She also complains of a mild headache. She is no short of breath. She has not had pain. She has not had fever. She has no acute illness.  Past Medical History  Diagnosis Date  . Depression   . Schizophrenia   . Hyperlipidemia   . Hypertension   . Chronic pain     "over my whole body" (03/30/2013)  . 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 DVT (deep venous thrombosis)     per 01/17/10 d/c summary- "remote hx of dvt"?  . Iron deficiency anemia   . Glaucoma   . Hemorrhoids   . Asthma   . Anxiety   . GERD (gastroesophageal reflux disease)   . Psoriasis 04/29/2012    New onset evaluated by Dr Marylou Flesher, Dermatology, [facility] 6/13  Rx 0.1% Tacrolimus ointment  . Complication of anesthesia     "because I have sleep apnea" (03/30/2013)  . Heart murmur   . Chest pain, exertional   . Chronic bronchitis   . Sleep apnea     "waiting on my CPAP" (03/30/2013)  . Pneumonia     "a few times" (03/30/2013)  . Exertional shortness of breath     "& sometimes when laying down" (03/30/2013)  . Daily headache     "last 2 months" (03/30/2013)  . Arthritis     "joints" (03/30/2013)  . Varicose veins of legs   . Psoriasis    Past Surgical History  Procedure Laterality Date  . Spinal fusion      2004  . Carpal tunnel release Bilateral   . Tubal  ligation  1973  . Dilation and curettage of uterus  1970's    "once" (03/30/2013)  . 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 Joint replacement     Family History  Problem Relation Age of Onset  . Diabetes Sister   . Colon cancer Brother 40   History  Substance Use Topics  . Smoking status: Never Smoker   . Smokeless tobacco: Never Used  . Alcohol Use: No   OB History   Grav Para Term Preterm Abortions TAB SAB Ect Mult Living                 Review of Systems  Constitutional: Positive for fatigue. Negative for fever, chills, diaphoresis and appetite change.       Poor sleep  HENT: Negative for mouth sores, sore throat and trouble swallowing.   Eyes: Negative for visual disturbance.  Respiratory: Negative for cough, chest tightness, shortness of breath and wheezing.   Cardiovascular: Negative for chest pain.  Gastrointestinal: Negative for nausea, vomiting, abdominal pain, diarrhea and abdominal distention.  Endocrine: Negative for polydipsia, polyphagia and polyuria.  Genitourinary: Negative for dysuria, frequency and hematuria.  Musculoskeletal: Negative for gait problem.  Skin: Negative for color change, pallor and rash.  Neurological: Positive for dizziness and headaches. Negative for syncope and light-headedness.  Hematological: Does not bruise/bleed easily.  Psychiatric/Behavioral: Negative for behavioral problems and confusion.    Allergies  Review of patient's allergies indicates no known allergies.  Home Medications   Current Outpatient Rx  Name  Route  Sig  Dispense  Refill  . acetaminophen (TYLENOL) 500 MG tablet   Oral   Take 1,000 mg by mouth every 6 (six) hours as needed (For pain.).         Marland Kitchen albuterol (VENTOLIN HFA) 108 (90 BASE) MCG/ACT inhaler   Inhalation   Inhale 2 puffs into the lungs 2 (two) times daily as needed for wheezing or shortness of breath.          Marland Kitchen atenolol (TENORMIN) 100 MG tablet   Oral   Take 100 mg by mouth every morning.           . camphor-menthol (SARNA) lotion   Topical   Apply 1 application topically daily as needed for itching.          . clotrimazole (LOTRIMIN) 1 % cream   Topical   Apply 1 application topically 3 (three) times daily as needed (rash).          Marland Kitchen esomeprazole (NEXIUM) 40 MG capsule   Oral   Take 40 mg by mouth daily before breakfast.         . FLUoxetine (PROZAC) 20 MG capsule   Oral   Take 60 mg by mouth every morning.          . fluticasone (FLOVENT HFA) 220 MCG/ACT inhaler   Inhalation   Inhale 1 puff into the lungs 2 (two) times daily.          . hydrochlorothiazide (HYDRODIURIL) 25 MG tablet   Oral   Take 25 mg by mouth every morning.         Marland Kitchen lisinopril (PRINIVIL,ZESTRIL) 10 MG tablet   Oral   Take 10 mg by mouth every morning.          . meclizine (ANTIVERT) 25 MG tablet   Oral   Take 25 mg by mouth 3 (three) times daily as needed for dizziness.          . meloxicam (MOBIC) 15 MG tablet   Oral   Take 15 mg by mouth every morning.          . Nutritional Supplements (ESTROVEN ENERGY) TABS   Oral   Take 1 tablet by mouth every morning.          Marland Kitchen PRESCRIPTION MEDICATION   Topical   Apply 1 application topically daily. Silver sulfadiazine / triamcinolone cream= compounded by [facility] For psoriasis         . tacrolimus (PROTOPIC) 0.1 % ointment   Topical   Apply 1 application topically 3 (three) times daily as needed (rash).          Marland Kitchen tiZANidine (ZANAFLEX) 2 MG tablet   Oral   Take 1 tablet by mouth 3 (three) times daily as needed (pain, muscle spasm).          Marland Kitchen warfarin (COUMADIN) 5 MG tablet   Oral   Take 5 mg by mouth every morning.          . baclofen (LIORESAL) 10 MG tablet   Oral   Take 1 tablet by mouth 3 (three) times daily as  needed (pain).           BP 173/73  Pulse 63  Temp(Src) 98.2 F (36.8 C) (Oral)  Resp 18  SpO2 93%  LMP 11/22/1996 Physical Exam  Constitutional: She is oriented to person,  place, and time. She appears well-developed and well-nourished. No distress.  Please. No acute distress  HENT:  Head: Normocephalic.  Eyes: Conjunctivae are normal. Pupils are equal, round, and reactive to light. No scleral icterus.  Neck: Normal range of motion. Neck supple. No thyromegaly present.  Cardiovascular: Normal rate and regular rhythm.  Exam reveals no gallop and no friction rub.   No murmur heard. Pulmonary/Chest: Effort normal and breath sounds normal. No respiratory distress. She has no wheezes. She has no rales.  Abdominal: Soft. Bowel sounds are normal. She exhibits no distension. There is no tenderness. There is no rebound.  Musculoskeletal: Normal range of motion.  Neurological: She is alert and oriented to person, place, and time.  Skin: Skin is warm and dry. No rash noted.  Psychiatric: She has a normal mood and affect. Her behavior is normal.    ED Course  Procedures (including critical care time) Labs Review Labs Reviewed  CBC WITH DIFFERENTIAL - Abnormal; Notable for the following:    RBC 3.81 (*)    HCT 35.8 (*)    Neutrophils Relative % 78 (*)    All other components within normal limits  BASIC METABOLIC PANEL - Abnormal; Notable for the following:    Glucose, Bld 107 (*)    GFR calc non Af Amer 68 (*)    GFR calc Af Amer 79 (*)    All other components within normal limits  URINALYSIS, ROUTINE W REFLEX MICROSCOPIC - Abnormal; Notable for the following:    APPearance CLOUDY (*)    Leukocytes, UA SMALL (*)    All other components within normal limits  URINE MICROSCOPIC-ADD ON - Abnormal; Notable for the following:    Squamous Epithelial / LPF FEW (*)    Bacteria, UA MANY (*)    All other components within normal limits  TSH   Imaging Review No results found.  EKG Interpretation     Ventricular Rate:  57 PR Interval:  188 QRS Duration: 82 QT Interval:  427 QTC Calculation: 416 R Axis:   34 Text Interpretation:  Sinus rhythm Baseline wander in  lead(s) V3            MDM   1. Fatigue    Labs are reassuring. Does have a few cells and bacteria in her urine. However she is asymptomatic. Cultures pending. Normal sodium. I believe that this is fatigue. Multifactorial, but not due to acute illness.  She's getting quite a bit of help with her son at home with her husband and home health care nursing and day care. I have advised her to rest when she can. I don't feel that any acute intervention can be offered through the emergency room.    Roney Marion, MD 08/03/13 [phone]

## 2013-08-12 ENCOUNTER — Ambulatory Visit: Payer: PRIVATE HEALTH INSURANCE

## 2013-08-12 ENCOUNTER — Other Ambulatory Visit: Payer: PRIVATE HEALTH INSURANCE | Admitting: Lab

## 2013-08-12 ENCOUNTER — Encounter: Payer: Self-pay | Admitting: Pharmacist

## 2013-08-12 NOTE — Progress Notes (Signed)
[facility]KA in coumadin clinic today.  Left VM to call and reschedule 

## 2013-08-14 ENCOUNTER — Encounter (HOSPITAL_COMMUNITY): Payer: Self-pay | Admitting: Emergency Medicine

## 2013-08-14 ENCOUNTER — Emergency Department (HOSPITAL_COMMUNITY)
Admission: EM | Admit: 2013-08-14 | Discharge: 2013-08-14 | Disposition: A | Discharge status: Home / Self Care | Payer: PRIVATE HEALTH INSURANCE | Attending: Emergency Medicine | Admitting: Emergency Medicine

## 2013-08-14 DIAGNOSIS — Z8639 Personal history of other endocrine, nutritional and metabolic disease: Secondary | ICD-10-CM | POA: Insufficient documentation

## 2013-08-14 DIAGNOSIS — Z86718 Personal history of other venous thrombosis and embolism: Secondary | ICD-10-CM | POA: Insufficient documentation

## 2013-08-14 DIAGNOSIS — F411 Generalized anxiety disorder: Secondary | ICD-10-CM | POA: Insufficient documentation

## 2013-08-14 DIAGNOSIS — R5381 Other malaise: Secondary | ICD-10-CM | POA: Insufficient documentation

## 2013-08-14 DIAGNOSIS — R259 Unspecified abnormal involuntary movements: Secondary | ICD-10-CM | POA: Insufficient documentation

## 2013-08-14 DIAGNOSIS — Z872 Personal history of diseases of the skin and subcutaneous tissue: Secondary | ICD-10-CM | POA: Insufficient documentation

## 2013-08-14 DIAGNOSIS — F329 Major depressive disorder, single episode, unspecified: Secondary | ICD-10-CM | POA: Insufficient documentation

## 2013-08-14 DIAGNOSIS — J45909 Unspecified asthma, uncomplicated: Secondary | ICD-10-CM | POA: Insufficient documentation

## 2013-08-14 DIAGNOSIS — Z86711 Personal history of pulmonary embolism: Secondary | ICD-10-CM | POA: Insufficient documentation

## 2013-08-14 DIAGNOSIS — M129 Arthropathy, unspecified: Secondary | ICD-10-CM | POA: Insufficient documentation

## 2013-08-14 DIAGNOSIS — F209 Schizophrenia, unspecified: Secondary | ICD-10-CM | POA: Insufficient documentation

## 2013-08-14 DIAGNOSIS — G8929 Other chronic pain: Secondary | ICD-10-CM | POA: Insufficient documentation

## 2013-08-14 DIAGNOSIS — IMO0002 Reserved for concepts with insufficient information to code with codable children: Secondary | ICD-10-CM | POA: Insufficient documentation

## 2013-08-14 DIAGNOSIS — Z7901 Long term (current) use of anticoagulants: Secondary | ICD-10-CM | POA: Insufficient documentation

## 2013-08-14 DIAGNOSIS — I10 Essential (primary) hypertension: Secondary | ICD-10-CM | POA: Insufficient documentation

## 2013-08-14 DIAGNOSIS — Z79899 Other long term (current) drug therapy: Secondary | ICD-10-CM | POA: Insufficient documentation

## 2013-08-14 DIAGNOSIS — F3289 Other specified depressive episodes: Secondary | ICD-10-CM | POA: Insufficient documentation

## 2013-08-14 DIAGNOSIS — Z862 Personal history of diseases of the blood and blood-forming organs and certain disorders involving the immune mechanism: Secondary | ICD-10-CM | POA: Insufficient documentation

## 2013-08-14 DIAGNOSIS — Z8701 Personal history of pneumonia (recurrent): Secondary | ICD-10-CM | POA: Insufficient documentation

## 2013-08-14 DIAGNOSIS — G4730 Sleep apnea, unspecified: Secondary | ICD-10-CM | POA: Insufficient documentation

## 2013-08-14 DIAGNOSIS — R251 Tremor, unspecified: Secondary | ICD-10-CM

## 2013-08-14 DIAGNOSIS — H409 Unspecified glaucoma: Secondary | ICD-10-CM | POA: Insufficient documentation

## 2013-08-14 DIAGNOSIS — K219 Gastro-esophageal reflux disease without esophagitis: Secondary | ICD-10-CM | POA: Insufficient documentation

## 2013-08-14 DIAGNOSIS — R011 Cardiac murmur, unspecified: Secondary | ICD-10-CM | POA: Insufficient documentation

## 2013-08-14 DIAGNOSIS — Z791 Long term (current) use of non-steroidal anti-inflammatories (NSAID): Secondary | ICD-10-CM | POA: Insufficient documentation

## 2013-08-14 MED ORDER — ACETAMINOPHEN 325 MG PO TABS: 650.0000 mg | ORAL_TABLET | Freq: Once | ORAL | Status: DC

## 2013-08-14 MED ORDER — IBUPROFEN 200 MG PO TABS
600.0000 mg | ORAL_TABLET | Freq: Once | ORAL | Status: AC
  Administered 2013-08-14: 22:00:00 600 mg via ORAL
  Filled 2013-08-14 (×2): qty 1

## 2013-08-14 NOTE — ED Provider Notes (Signed)
CSN: [id number]     Arrival date & time 08/14/13  1640 History   First MD Initiated Contact with Patient 08/14/13 2048     Chief Complaint  Patient presents with  . Spasms  . Headache    Patient is a 72 y.o. female presenting with headaches. The history is provided by the patient.  Headache Associated symptoms: myalgias   Associated symptoms: no fever and no vomiting    Patient presents for "jerking" for several months She reports her arms will jerk at times and she will sometimes spill her coffee No new weakness She reports mild headache No visual changes She is ambulatory at baseline   No other complaints at this time  Past Medical History  Diagnosis Date  . Depression   . Schizophrenia   . Hyperlipidemia   . Hypertension   . Chronic pain     "over my whole body" (03/30/2013)  . 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 DVT (deep venous thrombosis)     per 01/17/10 d/c summary- "remote hx of dvt"?  . Iron deficiency anemia   . Glaucoma   . Hemorrhoids   . Asthma   . Anxiety   . GERD (gastroesophageal reflux disease)   . Psoriasis 04/29/2012    New onset evaluated by Dr Marylou Flesher, Dermatology, [facility] 6/13  Rx 0.1% Tacrolimus ointment  . Complication of anesthesia     "because I have sleep apnea" (03/30/2013)  . Heart murmur   . Chest pain, exertional   . Chronic bronchitis   . Sleep apnea     "waiting on my CPAP" (03/30/2013)  . Pneumonia     "a few times" (03/30/2013)  . Exertional shortness of breath     "& sometimes when laying down" (03/30/2013)  . Daily headache     "last 2 months" (03/30/2013)  . Arthritis     "joints" (03/30/2013)  . Varicose veins of legs   . Psoriasis    Past Surgical History  Procedure Laterality Date  . Spinal fusion      2004  . Carpal tunnel release Bilateral   . Tubal ligation  1973  . Dilation and curettage of uterus  1970's    "once" (03/30/2013)  . 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 Joint  replacement     Family History  Problem Relation Age of Onset  . Diabetes Sister   . Colon cancer Brother 40   History  Substance Use Topics  . Smoking status: Never Smoker   . Smokeless tobacco: Never Used  . Alcohol Use: No   OB History   Grav Para Term Preterm Abortions TAB SAB Ect Mult Living                 Review of Systems  Constitutional: Negative for fever.  Respiratory: Negative for shortness of breath.   Cardiovascular: Negative for chest pain.  Gastrointestinal: Negative for vomiting.  Musculoskeletal: Positive for myalgias.  Neurological: Positive for headaches. Negative for weakness.    Allergies  Review of patient's allergies indicates no known allergies.  Home Medications   Current Outpatient Rx  Name  Route  Sig  Dispense  Refill  . acetaminophen (TYLENOL) 500 MG tablet   Oral   Take 1,000 mg by mouth every 6 (six) hours as needed (For pain.).         Marland Kitchen albuterol (VENTOLIN HFA) 108 (90 BASE) MCG/ACT inhaler   Inhalation   Inhale  2 puffs into the lungs 2 (two) times daily as needed for wheezing or shortness of breath.          Marland Kitchen atenolol (TENORMIN) 100 MG tablet   Oral   Take 100 mg by mouth every morning.          . camphor-menthol (SARNA) lotion   Topical   Apply 1 application topically daily as needed for itching.          . clotrimazole (LOTRIMIN) 1 % cream   Topical   Apply 1 application topically 3 (three) times daily as needed (rash).          Marland Kitchen esomeprazole (NEXIUM) 40 MG capsule   Oral   Take 40 mg by mouth daily before breakfast.         . FLUoxetine (PROZAC) 20 MG capsule   Oral   Take 60 mg by mouth every morning.          . fluticasone (FLOVENT HFA) 220 MCG/ACT inhaler   Inhalation   Inhale 1 puff into the lungs 2 (two) times daily.          . hydrochlorothiazide (HYDRODIURIL) 25 MG tablet   Oral   Take 25 mg by mouth every morning.         Marland Kitchen lisinopril (PRINIVIL,ZESTRIL) 10 MG tablet   Oral   Take  10 mg by mouth every morning.          . meclizine (ANTIVERT) 25 MG tablet   Oral   Take 25 mg by mouth 3 (three) times daily as needed for dizziness.          . meloxicam (MOBIC) 15 MG tablet   Oral   Take 15 mg by mouth every morning.          Marland Kitchen PRESCRIPTION MEDICATION   Topical   Apply 1 application topically daily. Silver sulfadiazine / triamcinolone cream= compounded by [facility] For psoriasis         . tacrolimus (PROTOPIC) 0.1 % ointment   Topical   Apply 1 application topically 3 (three) times daily as needed (rash).          . warfarin (COUMADIN) 5 MG tablet   Oral   Take 5 mg by mouth every morning.           BP 126/69  Pulse 66  Temp(Src) 98.4 F (36.9 C) (Oral)  Resp 18  SpO2 98%  LMP 11/22/1996 Physical Exam CONSTITUTIONAL: Well developed/well nourished HEAD: Normocephalic/atraumatic EYES: EOMI/PERRL ENMT: Mucous membranes moist NECK: supple no meningeal signs SPINE:entire spine nontender CV: S1/S2 noted, no murmurs/rubs/gallops noted LUNGS: Lungs are clear to auscultation bilaterally, no apparent distress ABDOMEN: soft, nontender, no rebound or guarding GU:no cva tenderness NEURO: Pt is awake/alert, moves all extremitiesx4, no arm/leg drift.  No past pointing.  No ataxia.   EXTREMITIES: pulses normal, full ROM SKIN: warm, color normal PSYCH: flat affect  ED Course  Procedures   EKG Interpretation   None       MDM   1. Tremor    Nursing notes including past medical history and social history reviewed and considered in documentation  Pt well appearing, no distress, ambulatory, reports tremors (none noted on my evaluation) I advised to f/u with neurology   Joya Gaskins, MD 08/14/13 2247

## 2013-08-14 NOTE — ED Notes (Signed)
PT reports a several month Hx of muscle jerking through out her body. Pt goes to [facility] spinal clinic . DR Clois Comber Ajam for chronic pain in back. Pt reports Baclofen makes the jerking in hand worse and she spills her coffee.

## 2013-08-19 ENCOUNTER — Other Ambulatory Visit (HOSPITAL_COMMUNITY): Payer: Self-pay | Admitting: Interventional Radiology

## 2013-08-19 DIAGNOSIS — I83811 Varicose veins of right lower extremities with pain: Secondary | ICD-10-CM

## 2013-08-19 DIAGNOSIS — R200 Anesthesia of skin: Secondary | ICD-10-CM

## 2013-08-21 ENCOUNTER — Emergency Department (HOSPITAL_COMMUNITY)
Admission: EM | Admit: 2013-08-21 | Discharge: 2013-08-21 | Disposition: A | Discharge status: Home / Self Care | Payer: PRIVATE HEALTH INSURANCE | Attending: Emergency Medicine | Admitting: Emergency Medicine

## 2013-08-21 ENCOUNTER — Encounter (HOSPITAL_COMMUNITY): Payer: Self-pay | Admitting: Emergency Medicine

## 2013-08-21 DIAGNOSIS — F411 Generalized anxiety disorder: Secondary | ICD-10-CM | POA: Insufficient documentation

## 2013-08-21 DIAGNOSIS — K219 Gastro-esophageal reflux disease without esophagitis: Secondary | ICD-10-CM | POA: Insufficient documentation

## 2013-08-21 DIAGNOSIS — J45909 Unspecified asthma, uncomplicated: Secondary | ICD-10-CM | POA: Insufficient documentation

## 2013-08-21 DIAGNOSIS — Z872 Personal history of diseases of the skin and subcutaneous tissue: Secondary | ICD-10-CM | POA: Insufficient documentation

## 2013-08-21 DIAGNOSIS — Z862 Personal history of diseases of the blood and blood-forming organs and certain disorders involving the immune mechanism: Secondary | ICD-10-CM | POA: Insufficient documentation

## 2013-08-21 DIAGNOSIS — F3289 Other specified depressive episodes: Secondary | ICD-10-CM | POA: Insufficient documentation

## 2013-08-21 DIAGNOSIS — R209 Unspecified disturbances of skin sensation: Secondary | ICD-10-CM | POA: Insufficient documentation

## 2013-08-21 DIAGNOSIS — Z7901 Long term (current) use of anticoagulants: Secondary | ICD-10-CM | POA: Insufficient documentation

## 2013-08-21 DIAGNOSIS — Z8701 Personal history of pneumonia (recurrent): Secondary | ICD-10-CM | POA: Insufficient documentation

## 2013-08-21 DIAGNOSIS — F329 Major depressive disorder, single episode, unspecified: Secondary | ICD-10-CM | POA: Insufficient documentation

## 2013-08-21 DIAGNOSIS — IMO0002 Reserved for concepts with insufficient information to code with codable children: Secondary | ICD-10-CM | POA: Insufficient documentation

## 2013-08-21 DIAGNOSIS — M79609 Pain in unspecified limb: Secondary | ICD-10-CM | POA: Insufficient documentation

## 2013-08-21 DIAGNOSIS — F209 Schizophrenia, unspecified: Secondary | ICD-10-CM | POA: Insufficient documentation

## 2013-08-21 DIAGNOSIS — Z86718 Personal history of other venous thrombosis and embolism: Secondary | ICD-10-CM | POA: Insufficient documentation

## 2013-08-21 DIAGNOSIS — I10 Essential (primary) hypertension: Secondary | ICD-10-CM | POA: Insufficient documentation

## 2013-08-21 DIAGNOSIS — Z79899 Other long term (current) drug therapy: Secondary | ICD-10-CM | POA: Insufficient documentation

## 2013-08-21 DIAGNOSIS — Z791 Long term (current) use of non-steroidal anti-inflammatories (NSAID): Secondary | ICD-10-CM | POA: Insufficient documentation

## 2013-08-21 DIAGNOSIS — I8390 Asymptomatic varicose veins of unspecified lower extremity: Secondary | ICD-10-CM | POA: Insufficient documentation

## 2013-08-21 DIAGNOSIS — M129 Arthropathy, unspecified: Secondary | ICD-10-CM | POA: Insufficient documentation

## 2013-08-21 DIAGNOSIS — Z86711 Personal history of pulmonary embolism: Secondary | ICD-10-CM | POA: Insufficient documentation

## 2013-08-21 DIAGNOSIS — G8929 Other chronic pain: Secondary | ICD-10-CM | POA: Insufficient documentation

## 2013-08-21 DIAGNOSIS — R011 Cardiac murmur, unspecified: Secondary | ICD-10-CM | POA: Insufficient documentation

## 2013-08-21 DIAGNOSIS — R21 Rash and other nonspecific skin eruption: Secondary | ICD-10-CM | POA: Insufficient documentation

## 2013-08-21 DIAGNOSIS — Z8639 Personal history of other endocrine, nutritional and metabolic disease: Secondary | ICD-10-CM | POA: Insufficient documentation

## 2013-08-21 MED ORDER — HYDROMORPHONE HCL PF 1 MG/ML IJ SOLN
1.0000 mg | Freq: Once | INTRAMUSCULAR | Status: AC
  Administered 2013-08-21: 1 mg via INTRAMUSCULAR
  Filled 2013-08-21: qty 1

## 2013-08-21 NOTE — ED Provider Notes (Signed)
TIME SEEN: 12:00 PM  CHIEF COMPLAINT: Bilateral lower extremity pain  HPI: Patient is a 72 year old female with a history of depression, schizophrenia, hypertension, hyperlipidemia, prior PE and DVT currently on Coumadin, psoriasis, chronic back pain and bilateral lower extremity pain, varicose veins who presents emergency department with an exacerbation of her chronic lower extremity pain. She also complains of numbness in her feet which is also chronic. No new numbness or focal weakness. No fever. No history of injury. She reports her INRs have been therapeutic. She denies any chest pain or shortness of breath. She states she is here to get a "pain shot" because she is out of her pain medication. She is followed by a primary care physician at [facility] and sees Dr. Myna Hidalgo at [facility] for pain management.  ROS: See HPI Constitutional: no fever  Eyes: no drainage  ENT: no runny nose   Cardiovascular:  no chest pain  Resp: no SOB  GI: no vomiting GU: no dysuria Integumentary: no rash  Allergy: no hives  Musculoskeletal: no leg swelling  Neurological: no slurred speech ROS otherwise negative  PAST MEDICAL HISTORY/PAST SURGICAL HISTORY:  Past Medical History  Diagnosis Date  . Depression   . Schizophrenia   . Hyperlipidemia   . Hypertension   . Chronic pain     "over my whole body" (03/30/2013)  . 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 DVT (deep venous thrombosis)     per 01/17/10 d/c summary- "remote hx of dvt"?  . Iron deficiency anemia   . Glaucoma   . Hemorrhoids   . Asthma   . Anxiety   . GERD (gastroesophageal reflux disease)   . Psoriasis 04/29/2012    New onset evaluated by Dr Marylou Flesher, Dermatology, [facility] 6/13  Rx 0.1% Tacrolimus ointment  . Complication of anesthesia     "because I have sleep apnea" (03/30/2013)  . Heart murmur   . Chest pain, exertional   . Chronic bronchitis   . Sleep apnea     "waiting on my CPAP" (03/30/2013)  .  Pneumonia     "a few times" (03/30/2013)  . Exertional shortness of breath     "& sometimes when laying down" (03/30/2013)  . Daily headache     "last 2 months" (03/30/2013)  . Arthritis     "joints" (03/30/2013)  . Varicose veins of legs   . Psoriasis     MEDICATIONS:  Prior to Admission medications   Medication Sig Start Date End Date Taking? Authorizing Provider  albuterol (VENTOLIN HFA) 108 (90 BASE) MCG/ACT inhaler Inhale 2 puffs into the lungs 2 (two) times daily as needed for wheezing or shortness of breath.    Yes Historical Provider, MD  camphor-menthol Wynelle Fanny) lotion Apply 1 application topically daily as needed for itching.    Yes Historical Provider, MD  clotrimazole (LOTRIMIN) 1 % cream Apply 1 application topically 3 (three) times daily as needed (rash).  04/09/13  Yes Historical Provider, MD  esomeprazole (NEXIUM) 40 MG capsule Take 40 mg by mouth daily before breakfast.   Yes Historical Provider, MD  FLUoxetine (PROZAC) 20 MG capsule Take 60 mg by mouth every morning.    Yes Historical Provider, MD  fluticasone (FLOVENT HFA) 220 MCG/ACT inhaler Inhale 1 puff into the lungs 2 (two) times daily.    Yes Historical Provider, MD  gabapentin (NEURONTIN) 100 MG capsule Take 300 mg by mouth 3 (three) times daily.   Yes Historical  Provider, MD  hydrochlorothiazide (HYDRODIURIL) 25 MG tablet Take 25 mg by mouth every morning.   Yes Historical Provider, MD  HYDROcodone-acetaminophen (NORCO/VICODIN) 5-325 MG per tablet Take 1 tablet by mouth 2 (two) times daily as needed. For pain 08/10/13  Yes Historical Provider, MD  ibuprofen (ADVIL,MOTRIN) 200 MG tablet Take 600 mg by mouth every 6 (six) hours as needed for moderate pain.   Yes Historical Provider, MD  lisinopril (PRINIVIL,ZESTRIL) 10 MG tablet Take 10 mg by mouth every morning.  06/07/13  Yes Historical Provider, MD  meclizine (ANTIVERT) 25 MG tablet Take 25 mg by mouth 3 (three) times daily as needed for dizziness.    Yes Historical  Provider, MD  meloxicam (MOBIC) 15 MG tablet Take 15 mg by mouth every morning.    Yes Historical Provider, MD  PRESCRIPTION MEDICATION Apply 1 application topically daily. Silver sulfadiazine / triamcinolone cream= compounded by [facility] For psoriasis   Yes Historical Provider, MD  tacrolimus (PROTOPIC) 0.1 % ointment Apply 1 application topically 3 (three) times daily as needed (rash).  01/15/13  Yes Historical Provider, MD  warfarin (COUMADIN) 5 MG tablet Take 5 mg by mouth every morning.    Yes Historical Provider, MD    ALLERGIES:  No Known Allergies  SOCIAL HISTORY:  History  Substance Use Topics  . Smoking status: Never Smoker   . Smokeless tobacco: Never Used  . Alcohol Use: No    FAMILY HISTORY: Family History  Problem Relation Age of Onset  . Diabetes Sister   . Colon cancer Brother 40    EXAM: BP 154/83  Pulse 65  Temp(Src) 98.7 F (37.1 C) (Oral)  Resp 18  Ht 5\' 5"  (1.651 m)  Wt 243 lb (110.224 kg)  BMI 40.44 kg/m2  SpO2 95%  LMP 11/22/1996 CONSTITUTIONAL: Alert and oriented and responds appropriately to questions. Well-appearing; well-nourished HEAD: Normocephalic EYES: Conjunctivae clear, PERRL ENT: normal nose; no rhinorrhea; moist mucous membranes; pharynx without lesions noted NECK: Supple, no meningismus, no LAD  CARD: RRR; S1 and S2 appreciated; no murmurs, no clicks, no rubs, no gallops RESP: Normal chest excursion without splinting or tachypnea; breath sounds clear and equal bilaterally; no wheezes, no rhonchi, no rales,  ABD/GI: Normal bowel sounds; non-distended; soft, non-tender, no rebound, no guarding BACK:  The back appears normal and is non-tender to palpation, there is no CVA tenderness EXT: Varicose veins noted diffusely on bilateral lower extremities, no erythema or warmth, no induration, 2+ DP pulses bilaterally, legs are warm and well perfused, no joint effusion, Normal ROM in all joints; non-tender to palpation; no edema or asymmetry; normal  capillary refill; no cyanosis; no calf tenderness    SKIN: Normal color for age and race; warm; mild psoriatic lesions to bilateral feet without signs of superimposed infection NEURO: Moves all extremities equally PSYCH: The patient's mood and manner are appropriate. Grooming and personal hygiene are appropriate.  MEDICAL DECISION MAKING: Patient here with a acute exacerbation of her chronic lower extremity pain. She is neurovascularly intact distally. No history of injury. She has had prior DVTs but denies any calf pain or leg swelling. She is currently on Coumadin and reports her last INR was therapeutic. She states that she has run out of her pain medication and is requesting a shot of pain medicine in the ED. Patient has a pain management specialist. She has followup appointment on Monday. Will give one dose of IM Dilaudid in the ED to treat her acute pain but have discussed with patient  that she needs to followup with her pain management specialist for better pain control given her frequent visits the emergency department for the same symptoms. Given return precautions. Patient verbalized understanding and is comfortable with plan.      Layla Maw Corbin Hott, DO 08/21/13 1217

## 2013-08-21 NOTE — ED Notes (Signed)
Pt c/o chronic leg pain, reports she has been seeing a pain clinic and was just up here with her son for seizures so she wasn't able to take her dose today and needs to get an injection to make her feel better. Pt reports she doesn't want a prescription because she knows she can't have one but reports that she wants to be treated for pain here. Nad, skin warm and dry, resp e/u.

## 2013-08-21 NOTE — ED Notes (Signed)
Pt just finished getting her son seen and now c/o leg pain that is chronic in nature from varicose veins and pain on her skin from psoriasis; pt sts all pain is chronic with some numbness to legs

## 2013-08-31 ENCOUNTER — Other Ambulatory Visit: Payer: Self-pay | Admitting: *Deleted

## 2013-08-31 ENCOUNTER — Telehealth: Payer: Self-pay | Admitting: Pharmacist

## 2013-09-01 ENCOUNTER — Ambulatory Visit: Payer: PRIVATE HEALTH INSURANCE

## 2013-09-01 ENCOUNTER — Other Ambulatory Visit: Payer: PRIVATE HEALTH INSURANCE | Admitting: Lab

## 2013-09-03 ENCOUNTER — Other Ambulatory Visit: Payer: PRIVATE HEALTH INSURANCE

## 2013-09-04 ENCOUNTER — Ambulatory Visit: Payer: PRIVATE HEALTH INSURANCE

## 2013-09-04 ENCOUNTER — Other Ambulatory Visit: Payer: PRIVATE HEALTH INSURANCE

## 2013-09-07 ENCOUNTER — Encounter (HOSPITAL_COMMUNITY): Payer: Self-pay | Admitting: Emergency Medicine

## 2013-09-07 ENCOUNTER — Ambulatory Visit: Payer: PRIVATE HEALTH INSURANCE

## 2013-09-07 ENCOUNTER — Telehealth: Payer: Self-pay | Admitting: Pharmacist

## 2013-09-07 ENCOUNTER — Other Ambulatory Visit: Payer: PRIVATE HEALTH INSURANCE

## 2013-09-07 ENCOUNTER — Emergency Department (HOSPITAL_COMMUNITY)
Admission: EM | Admit: 2013-09-07 | Discharge: 2013-09-07 | Disposition: A | Discharge status: Home / Self Care | Payer: PRIVATE HEALTH INSURANCE | Attending: Emergency Medicine | Admitting: Emergency Medicine

## 2013-09-07 DIAGNOSIS — Z7901 Long term (current) use of anticoagulants: Secondary | ICD-10-CM | POA: Insufficient documentation

## 2013-09-07 DIAGNOSIS — I83893 Varicose veins of bilateral lower extremities with other complications: Secondary | ICD-10-CM | POA: Insufficient documentation

## 2013-09-07 DIAGNOSIS — Z862 Personal history of diseases of the blood and blood-forming organs and certain disorders involving the immune mechanism: Secondary | ICD-10-CM | POA: Insufficient documentation

## 2013-09-07 DIAGNOSIS — F209 Schizophrenia, unspecified: Secondary | ICD-10-CM | POA: Insufficient documentation

## 2013-09-07 DIAGNOSIS — R011 Cardiac murmur, unspecified: Secondary | ICD-10-CM | POA: Insufficient documentation

## 2013-09-07 DIAGNOSIS — K219 Gastro-esophageal reflux disease without esophagitis: Secondary | ICD-10-CM | POA: Insufficient documentation

## 2013-09-07 DIAGNOSIS — M129 Arthropathy, unspecified: Secondary | ICD-10-CM | POA: Insufficient documentation

## 2013-09-07 DIAGNOSIS — Z86711 Personal history of pulmonary embolism: Secondary | ICD-10-CM | POA: Insufficient documentation

## 2013-09-07 DIAGNOSIS — F3289 Other specified depressive episodes: Secondary | ICD-10-CM | POA: Insufficient documentation

## 2013-09-07 DIAGNOSIS — I8390 Asymptomatic varicose veins of unspecified lower extremity: Secondary | ICD-10-CM

## 2013-09-07 DIAGNOSIS — Z8701 Personal history of pneumonia (recurrent): Secondary | ICD-10-CM | POA: Insufficient documentation

## 2013-09-07 DIAGNOSIS — Z79899 Other long term (current) drug therapy: Secondary | ICD-10-CM | POA: Insufficient documentation

## 2013-09-07 DIAGNOSIS — F329 Major depressive disorder, single episode, unspecified: Secondary | ICD-10-CM | POA: Insufficient documentation

## 2013-09-07 DIAGNOSIS — F411 Generalized anxiety disorder: Secondary | ICD-10-CM | POA: Insufficient documentation

## 2013-09-07 DIAGNOSIS — G8929 Other chronic pain: Secondary | ICD-10-CM | POA: Insufficient documentation

## 2013-09-07 DIAGNOSIS — I10 Essential (primary) hypertension: Secondary | ICD-10-CM | POA: Insufficient documentation

## 2013-09-07 DIAGNOSIS — Z872 Personal history of diseases of the skin and subcutaneous tissue: Secondary | ICD-10-CM | POA: Insufficient documentation

## 2013-09-07 DIAGNOSIS — H409 Unspecified glaucoma: Secondary | ICD-10-CM | POA: Insufficient documentation

## 2013-09-07 DIAGNOSIS — E785 Hyperlipidemia, unspecified: Secondary | ICD-10-CM | POA: Insufficient documentation

## 2013-09-07 DIAGNOSIS — J45909 Unspecified asthma, uncomplicated: Secondary | ICD-10-CM | POA: Insufficient documentation

## 2013-09-07 DIAGNOSIS — Z86718 Personal history of other venous thrombosis and embolism: Secondary | ICD-10-CM | POA: Insufficient documentation

## 2013-09-07 LAB — POCT I-STAT, CHEM 8
BUN: 16 mg/dL (ref 6–23)
Calcium, Ion: 1.15 mmol/L (ref 1.13–1.30)
Chloride: 100 mEq/L (ref 96–112)
Creatinine, Ser: 1.1 mg/dL (ref 0.50–1.10)
Hemoglobin: 13.6 g/dL (ref 12.0–15.0)
Sodium: 141 mEq/L (ref 135–145)
TCO2: 27 mmol/L (ref 0–100)

## 2013-09-07 MED ORDER — ONDANSETRON 4 MG PO TBDP
4.0000 mg | ORAL_TABLET | Freq: Once | ORAL | Status: AC
  Administered 2013-09-07: 4 mg via ORAL
  Filled 2013-09-07: qty 1

## 2013-09-07 MED ORDER — HYDROMORPHONE HCL PF 2 MG/ML IJ SOLN
2.0000 mg | Freq: Once | INTRAMUSCULAR | Status: AC
  Administered 2013-09-07: 2 mg via INTRAMUSCULAR
  Filled 2013-09-07: qty 1

## 2013-09-07 NOTE — ED Notes (Signed)
Pt arrived to the ED with a complaint of leg cramps.  Pt states she has had this pain for about a year.  Pt is here with her son who is having seizures and thought she needed to be checked out.  Pt states her cramps are a result of low potassium levels.

## 2013-09-07 NOTE — ED Provider Notes (Addendum)
CSN: [id number]     Arrival date & time 09/07/13  0234 History   First MD Initiated Contact with Patient 09/07/13 0305     Chief Complaint  Patient presents with  . Leg Pain   (Consider location/radiation/quality/duration/timing/severity/associated sxs/prior Treatment) Patient is a 72 y.o. female presenting with leg pain. The history is provided by the patient.  Leg Pain Location:  Leg Time since incident: chronic but worse in the last few weeks. Injury: no   Leg location:  L leg, R leg, L upper leg, R upper leg, L lower leg and R lower leg Pain details:    Quality:  Aching, cramping and throbbing   Severity:  Moderate   Onset quality:  Gradual   Timing:  Constant   Progression:  Waxing and waning Chronicity:  Chronic Foreign body present:  No foreign bodies Prior injury to area:  No Relieved by: some relief with ibuprofen. Worsened by:  Bearing weight and activity Ineffective treatments:  Rest Associated symptoms: no back pain, no decreased ROM, no fever, no muscle weakness and no swelling   Associated symptoms comment:  Cramps   Past Medical History  Diagnosis Date  . Depression   . Schizophrenia   . Hyperlipidemia   . Hypertension   . Chronic pain     "over my whole body" (03/30/2013)  . 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 DVT (deep venous thrombosis)     per 01/17/10 d/c summary- "remote hx of dvt"?  . Iron deficiency anemia   . Glaucoma   . Hemorrhoids   . Asthma   . Anxiety   . GERD (gastroesophageal reflux disease)   . Psoriasis 04/29/2012    New onset evaluated by Dr Marylou Flesher, Dermatology, [facility] 6/13  Rx 0.1% Tacrolimus ointment  . Complication of anesthesia     "because I have sleep apnea" (03/30/2013)  . Heart murmur   . Chest pain, exertional   . Chronic bronchitis   . Sleep apnea     "waiting on my CPAP" (03/30/2013)  . Pneumonia     "a few times" (03/30/2013)  . Exertional shortness of breath     "& sometimes when  laying down" (03/30/2013)  . Daily headache     "last 2 months" (03/30/2013)  . Arthritis     "joints" (03/30/2013)  . Varicose veins of legs   . Psoriasis    Past Surgical History  Procedure Laterality Date  . Spinal fusion      2004  . Carpal tunnel release Bilateral   . Tubal ligation  1973  . Dilation and curettage of uterus  1970's    "once" (03/30/2013)  . 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 Joint replacement     Family History  Problem Relation Age of Onset  . Diabetes Sister   . Colon cancer Brother 40   History  Substance Use Topics  . Smoking status: Never Smoker   . Smokeless tobacco: Never Used  . Alcohol Use: No   OB History   Grav Para Term Preterm Abortions TAB SAB Ect Mult Living                 Review of Systems  Constitutional: Negative for fever.  Musculoskeletal: Negative for back pain.  All other systems reviewed and are negative.    Allergies  Review of patient's allergies indicates no known allergies.  Home Medications   Current Outpatient Rx  Name  Route  Sig  Dispense  Refill  . albuterol (VENTOLIN HFA) 108 (90 BASE) MCG/ACT inhaler   Inhalation   Inhale 2 puffs into the lungs 2 (two) times daily as needed for wheezing or shortness of breath.          . esomeprazole (NEXIUM) 40 MG capsule   Oral   Take 40 mg by mouth daily before breakfast.         . FLUoxetine (PROZAC) 20 MG capsule   Oral   Take 60 mg by mouth every morning.          . gabapentin (NEURONTIN) 100 MG capsule   Oral   Take 200 mg by mouth 3 (three) times daily.          . hydrochlorothiazide (HYDRODIURIL) 25 MG tablet   Oral   Take 25 mg by mouth every morning.         Marland Kitchen HYDROcodone-acetaminophen (NORCO/VICODIN) 5-325 MG per tablet   Oral   Take 1 tablet by mouth 2 (two) times daily as needed. For pain         . ibuprofen (ADVIL,MOTRIN) 200 MG tablet   Oral   Take 600 mg by mouth every 6 (six) hours as needed for moderate pain.         .  meclizine (ANTIVERT) 25 MG tablet   Oral   Take 25 mg by mouth 3 (three) times daily as needed for dizziness.          . meloxicam (MOBIC) 15 MG tablet   Oral   Take 15 mg by mouth daily as needed for pain.          Marland Kitchen PRESCRIPTION MEDICATION   Topical   Apply 1 application topically daily. Silver sulfadiazine / triamcinolone cream= compounded by [facility] For psoriasis         . tacrolimus (PROTOPIC) 0.1 % ointment   Topical   Apply 1 application topically 3 (three) times daily as needed (rash).          . warfarin (COUMADIN) 5 MG tablet   Oral   Take 5 mg by mouth every morning.           BP 173/90  Pulse 72  Temp(Src) 98.4 F (36.9 C) (Oral)  Resp 16  Ht 5\' 5"  (1.651 m)  Wt 240 lb (108.863 kg)  BMI 39.94 kg/m2  SpO2 93%  LMP 11/22/1996 Physical Exam  Nursing note and vitals reviewed. Constitutional: She is oriented to person, place, and time. She appears well-developed and well-nourished. No distress.  HENT:  Head: Normocephalic and atraumatic.  Eyes: EOM are normal. Pupils are equal, round, and reactive to light.  Cardiovascular: Normal rate.   Pulmonary/Chest: Effort normal.  Musculoskeletal: She exhibits tenderness.  Severe bilateral tender varicose veins with skin changes consistent with chronic venous insufficiency.  No erythema, fluctuance or lesions.  Pt has normal DP pulses bilaterally 2+.  Neurological: She is alert and oriented to person, place, and time.  Skin: Skin is warm and dry. No rash noted. No erythema. No pallor.  Psychiatric: She has a normal mood and affect. Her behavior is normal.    ED Course  Procedures (including critical care time) Labs Review Labs Reviewed  POCT I-STAT, CHEM 8 - Abnormal; Notable for the following:    Glucose, Bld 104 (*)    All other components within normal limits  PROTIME-INR   Imaging Review No results found.  EKG Interpretation   None  MDM   1. Chronic pain   2. Varicose veins      Patient is here complaining of her chronic back pain and pain in her legs from her varicose veins. She assures me that the pain is no different than her chronic pain but because she's been trying to wean off her pain medication she thinks it made the pain worse. Also she has been getting leg cramps and is concerned that her potassium is low. She states in the past when she gets these cramps her potassium is usually low.  On exam patient has severe varicose veins in bilateral legs without any signs of skin breakdown or cellulitis. She is neurovascularly intact and is able to ambulate with minimal difficulty.  I-STAT within normal potassium today. Patient given one injection of pain medication and check her INR she was due to be checked tomorrow.  4:21 AM INR subthearapeutic at 1.53 and pt will call coumadin clinic in the morning for instruction on coumadin adjustment.   Gwyneth Sprout, MD 09/07/13 1610  Gwyneth Sprout, MD 09/07/13 [id number]

## 2013-09-07 NOTE — Telephone Encounter (Signed)
Patient missed her coumadin clinic appointment today in the office but did present to the ED earlier this morning for increased leg pain. An INR was drawn there and was Subtherapeutic at 1.53. Called patient to discuss results but no answer. I left a VM instructing patient call back to discuss results and plan for coumadin dosing. Pt has been stable on 5 mg daily would recommend a one time boost dose of 7.5 mg x 1 and to continue 5 mg daily  Christell Faith, PharmD

## 2013-09-09 ENCOUNTER — Emergency Department (HOSPITAL_COMMUNITY)
Admission: EM | Admit: 2013-09-09 | Discharge: 2013-09-10 | Disposition: A | Discharge status: Home / Self Care | Payer: PRIVATE HEALTH INSURANCE | Attending: Emergency Medicine | Admitting: Emergency Medicine

## 2013-09-09 ENCOUNTER — Ambulatory Visit (INDEPENDENT_AMBULATORY_CARE_PROVIDER_SITE_OTHER): Payer: Self-pay | Admitting: Pharmacist

## 2013-09-09 ENCOUNTER — Telehealth: Payer: Self-pay | Admitting: *Deleted

## 2013-09-09 ENCOUNTER — Encounter (HOSPITAL_COMMUNITY): Payer: Self-pay | Admitting: Emergency Medicine

## 2013-09-09 DIAGNOSIS — F209 Schizophrenia, unspecified: Secondary | ICD-10-CM | POA: Insufficient documentation

## 2013-09-09 DIAGNOSIS — I83893 Varicose veins of bilateral lower extremities with other complications: Secondary | ICD-10-CM | POA: Insufficient documentation

## 2013-09-09 DIAGNOSIS — R52 Pain, unspecified: Secondary | ICD-10-CM | POA: Insufficient documentation

## 2013-09-09 DIAGNOSIS — M129 Arthropathy, unspecified: Secondary | ICD-10-CM | POA: Insufficient documentation

## 2013-09-09 DIAGNOSIS — F329 Major depressive disorder, single episode, unspecified: Secondary | ICD-10-CM | POA: Insufficient documentation

## 2013-09-09 DIAGNOSIS — Z8701 Personal history of pneumonia (recurrent): Secondary | ICD-10-CM | POA: Insufficient documentation

## 2013-09-09 DIAGNOSIS — Z7901 Long term (current) use of anticoagulants: Secondary | ICD-10-CM

## 2013-09-09 DIAGNOSIS — D6859 Other primary thrombophilia: Secondary | ICD-10-CM

## 2013-09-09 DIAGNOSIS — Z862 Personal history of diseases of the blood and blood-forming organs and certain disorders involving the immune mechanism: Secondary | ICD-10-CM | POA: Insufficient documentation

## 2013-09-09 DIAGNOSIS — E785 Hyperlipidemia, unspecified: Secondary | ICD-10-CM | POA: Insufficient documentation

## 2013-09-09 DIAGNOSIS — G4730 Sleep apnea, unspecified: Secondary | ICD-10-CM | POA: Insufficient documentation

## 2013-09-09 DIAGNOSIS — Z86711 Personal history of pulmonary embolism: Secondary | ICD-10-CM | POA: Insufficient documentation

## 2013-09-09 DIAGNOSIS — Z872 Personal history of diseases of the skin and subcutaneous tissue: Secondary | ICD-10-CM | POA: Insufficient documentation

## 2013-09-09 DIAGNOSIS — K219 Gastro-esophageal reflux disease without esophagitis: Secondary | ICD-10-CM | POA: Insufficient documentation

## 2013-09-09 DIAGNOSIS — D6851 Activated protein C resistance: Secondary | ICD-10-CM

## 2013-09-09 DIAGNOSIS — F411 Generalized anxiety disorder: Secondary | ICD-10-CM | POA: Insufficient documentation

## 2013-09-09 DIAGNOSIS — Z86718 Personal history of other venous thrombosis and embolism: Secondary | ICD-10-CM | POA: Insufficient documentation

## 2013-09-09 DIAGNOSIS — J45909 Unspecified asthma, uncomplicated: Secondary | ICD-10-CM | POA: Insufficient documentation

## 2013-09-09 DIAGNOSIS — R011 Cardiac murmur, unspecified: Secondary | ICD-10-CM | POA: Insufficient documentation

## 2013-09-09 DIAGNOSIS — I10 Essential (primary) hypertension: Secondary | ICD-10-CM | POA: Insufficient documentation

## 2013-09-09 DIAGNOSIS — I2699 Other pulmonary embolism without acute cor pulmonale: Secondary | ICD-10-CM

## 2013-09-09 DIAGNOSIS — Z79899 Other long term (current) drug therapy: Secondary | ICD-10-CM | POA: Insufficient documentation

## 2013-09-09 DIAGNOSIS — G8929 Other chronic pain: Secondary | ICD-10-CM | POA: Insufficient documentation

## 2013-09-09 DIAGNOSIS — M7989 Other specified soft tissue disorders: Secondary | ICD-10-CM

## 2013-09-09 DIAGNOSIS — F3289 Other specified depressive episodes: Secondary | ICD-10-CM | POA: Insufficient documentation

## 2013-09-09 DIAGNOSIS — I83813 Varicose veins of bilateral lower extremities with pain: Secondary | ICD-10-CM

## 2013-09-09 MED ORDER — ACETAMINOPHEN-CODEINE #3 300-30 MG PO TABS: 1.0000 | ORAL_TABLET | Freq: Four times a day (QID) | ORAL | Status: DC | PRN

## 2013-09-09 MED ORDER — ENOXAPARIN SODIUM 150 MG/ML ~~LOC~~ SOLN
1.5000 mg/kg | Freq: Once | SUBCUTANEOUS | Status: AC
  Administered 2013-09-10: 165 mg via SUBCUTANEOUS
  Filled 2013-09-09: qty 2

## 2013-09-09 NOTE — ED Provider Notes (Signed)
CSN: [id number]     Arrival date & time 09/09/13  2143 History   First MD Initiated Contact with Patient 09/09/13 2214     Chief Complaint  Patient presents with  . Leg Swelling    Left   (Consider location/radiation/quality/duration/timing/severity/associated sxs/prior Treatment) The history is provided by the patient and medical records. No language interpreter was used.    [name] [name] is a 72 y.o. female  with a hx of depression, schizophrenia, hypertension, hyperlipidemia, prior PE and DVT currently on Coumadin, psoriasis, chronic back pain and bilateral lower extremity pain, and varicose veins presents to the Emergency Department complaining of gradual, persistent, progressively worsening pain in the bilateral lower legs which is chronic with associated swelling in the Left leg onset yesterday.  She reports that her leg feels "tight" in her calf.  Record review shows that the patient was seen for leg pain on 09/07/13 and her INR was 1.53.  Pt called the coumadin clinic who advised her to take a one time dose of 7.5mg  and then resume the daily 5mg  dose.  Pt reports taking vicodin without relief and caused nausea.  "stress" makes the pain in her legs worse.  Pt denies fever, chills, headache, neck pain, Chest pain, SOB, abd pain, N/V/D, weakness, numbness, dizziness, syncope.      Past Medical History  Diagnosis Date  . Depression   . Schizophrenia   . Hyperlipidemia   . Hypertension   . Chronic pain     "over my whole body" (03/30/2013)  . 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 DVT (deep venous thrombosis)     per 01/17/10 d/c summary- "remote hx of dvt"?  . Iron deficiency anemia   . Glaucoma   . Hemorrhoids   . Asthma   . Anxiety   . GERD (gastroesophageal reflux disease)   . Psoriasis 04/29/2012    New onset evaluated by Dr Marylou Flesher, Dermatology, [facility] 6/13  Rx 0.1% Tacrolimus ointment  . Complication of anesthesia     "because I have sleep  apnea" (03/30/2013)  . Heart murmur   . Chest pain, exertional   . Chronic bronchitis   . Sleep apnea     "waiting on my CPAP" (03/30/2013)  . Pneumonia     "a few times" (03/30/2013)  . Exertional shortness of breath     "& sometimes when laying down" (03/30/2013)  . Daily headache     "last 2 months" (03/30/2013)  . Arthritis     "joints" (03/30/2013)  . Varicose veins of legs   . Psoriasis    Past Surgical History  Procedure Laterality Date  . Spinal fusion      2004  . Carpal tunnel release Bilateral   . Tubal ligation  1973  . Dilation and curettage of uterus  1970's    "once" (03/30/2013)  . 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 Joint replacement     Family History  Problem Relation Age of Onset  . Diabetes Sister   . Colon cancer Brother 40   History  Substance Use Topics  . Smoking status: Never Smoker   . Smokeless tobacco: Never Used  . Alcohol Use: No   OB History   Grav Para Term Preterm Abortions TAB SAB Ect Mult Living                 Review of Systems  Constitutional: Negative for fever, diaphoresis, appetite change, fatigue and unexpected  weight change.  HENT: Negative for mouth sores.   Eyes: Negative for visual disturbance.  Respiratory: Negative for cough, chest tightness, shortness of breath and wheezing.   Cardiovascular: Positive for leg swelling. Negative for chest pain.  Gastrointestinal: Negative for nausea, vomiting, abdominal pain, diarrhea and constipation.  Endocrine: Negative for polydipsia, polyphagia and polyuria.  Genitourinary: Negative for dysuria, urgency, frequency and hematuria.  Musculoskeletal: Positive for arthralgias. Negative for back pain and neck stiffness.  Skin: Negative for rash.  Allergic/Immunologic: Negative for immunocompromised state.  Neurological: Negative for syncope, light-headedness and headaches.  Hematological: Does not bruise/bleed easily.  Psychiatric/Behavioral: Negative for sleep disturbance. The patient is  not nervous/anxious.     Allergies  Review of patient's allergies indicates no known allergies.  Home Medications   Current Outpatient Rx  Name  Route  Sig  Dispense  Refill  . albuterol (VENTOLIN HFA) 108 (90 BASE) MCG/ACT inhaler   Inhalation   Inhale 2 puffs into the lungs 2 (two) times daily as needed for wheezing or shortness of breath.          . esomeprazole (NEXIUM) 40 MG capsule   Oral   Take 40 mg by mouth daily before breakfast.         . FLUoxetine (PROZAC) 20 MG capsule   Oral   Take 60 mg by mouth every morning.          . gabapentin (NEURONTIN) 100 MG capsule   Oral   Take 200 mg by mouth 3 (three) times daily.          . hydrochlorothiazide (HYDRODIURIL) 25 MG tablet   Oral   Take 25 mg by mouth every morning.         Marland Kitchen HYDROcodone-acetaminophen (NORCO/VICODIN) 5-325 MG per tablet   Oral   Take 1 tablet by mouth 2 (two) times daily as needed. For pain         . ibuprofen (ADVIL,MOTRIN) 200 MG tablet   Oral   Take 600 mg by mouth every 6 (six) hours as needed for moderate pain.         . meclizine (ANTIVERT) 25 MG tablet   Oral   Take 25 mg by mouth 3 (three) times daily as needed for dizziness.          . meloxicam (MOBIC) 15 MG tablet   Oral   Take 15 mg by mouth daily as needed for pain.          Marland Kitchen PRESCRIPTION MEDICATION   Topical   Apply 1 application topically daily. Silver sulfadiazine / triamcinolone cream= compounded by [facility] For psoriasis         . tacrolimus (PROTOPIC) 0.1 % ointment   Topical   Apply 1 application topically 3 (three) times daily as needed (rash).          . warfarin (COUMADIN) 5 MG tablet   Oral   Take 5 mg by mouth every morning.          Marland Kitchen acetaminophen-codeine (TYLENOL #3) 300-30 MG per tablet   Oral   Take 1-2 tablets by mouth every 6 (six) hours as needed for moderate pain.   3 tablet   0    BP 132/77  Pulse 85  Temp(Src) 98.2 F (36.8 C) (Oral)  Resp 20  Wt 240 lb (108.863  kg)  SpO2 96%  LMP 11/22/1996 Physical Exam  Nursing note and vitals reviewed. Constitutional: She appears well-developed and well-nourished. No distress.  Awake, alert,  nontoxic appearance  HENT:  Head: Normocephalic and atraumatic.  Mouth/Throat: Oropharynx is clear and moist. No oropharyngeal exudate.  Eyes: Conjunctivae are normal. No scleral icterus.  Neck: Normal range of motion. Neck supple.  Cardiovascular: Normal rate, regular rhythm, normal heart sounds and intact distal pulses.   No murmur heard. Pulses:      Radial pulses are 2+ on the right side, and 2+ on the left side.       Dorsalis pedis pulses are 2+ on the right side, and 2+ on the left side.       Posterior tibial pulses are 2+ on the right side, and 2+ on the left side.  Capillary refill less than 3 seconds Palpable 2+ pulses in all extremities including bilateral lower extremities  Pulmonary/Chest: Effort normal and breath sounds normal. No respiratory distress. She has no wheezes.  Abdominal: Soft. Bowel sounds are normal. She exhibits no distension. There is no tenderness. There is no rebound.  Musculoskeletal: Normal range of motion. She exhibits tenderness. She exhibits no edema.  Bilateral tender varicose veins No obvious swelling of the left leg; left calf tenderness greater than right calf tenderness and prominent varicose veins; no pitting edema noted in either leg  Neurological: She is alert.  Speech is clear and goal oriented Moves extremities without ataxia  Skin: Skin is warm and dry. She is not diaphoretic.  Chronic venous skin changes in the bilateral lower extremities left greater than right No erythema, fluctuance or induration; no open lesions  Psychiatric: She has a normal mood and affect.    ED Course  Procedures (including critical care time) Labs Review Labs Reviewed - No data to display Imaging Review No results found.  EKG Interpretation   None       MDM   1. Left leg  swelling   2. Chronic anticoagulation   3. Varicose veins of both lower extremities with pain      KAIESHA TONNER presents with left leg swelling which she reports is new and bilateral lower extremity pain which is chronic.  On exam left leg with chronic venous stasis changes and possibly slightly enlarged, with torturous varicose veins, but soft to palpation.  Will dose with Lovenox x1 since patient is subtherapeutic with her Coumadin 2 days ago. We'll schedule outpatient venous duplex. Will prescribe 3 tablets of Tylenol No. 3 for pain control. Discussed at length with her to the emergency department will not by mouth to continue supply pain medication and she must see her primary care for this.  No erythema or induration to suggest cellulitis.  Pt is afebrile and non-tachycardic.    She is without chest pain, shortness of breath or tachycardia; highly doubt PE.  It has been determined that no acute conditions requiring further emergency intervention are present at this time. The patient/guardian have been advised of the diagnosis and plan. We have discussed signs and symptoms that warrant return to the ED, such as changes or worsening in symptoms.   Vital signs are stable at discharge.   BP 132/77  Pulse 85  Temp(Src) 98.2 F (36.8 C) (Oral)  Resp 20  Wt 240 lb (108.863 kg)  SpO2 96%  LMP 11/22/1996  Patient/guardian has voiced understanding and agreed to follow-up with the PCP or specialist.  The patient was discussed with and seen by Dr. Criss Alvine who agrees with the treatment plan.     Dahlia Client Shed Nixon, PA-C 09/10/13 [phone]

## 2013-09-09 NOTE — ED Notes (Signed)
Patient with c/o left leg swelling Patient with hx of DVT

## 2013-09-09 NOTE — Telephone Encounter (Signed)
Missed her 12/15 appointment due to being in ER "sick". INR was checked there-currently on Coumadin 5 mg daily. Denies any new meds. Made her aware that Coumadin clinic will be notified and they will call her with further instructions.

## 2013-09-09 NOTE — ED Notes (Signed)
Bed: JY78 Expected date:  Expected time:  Means of arrival:  Comments: EMS left leg swelling Hx of blood clot

## 2013-09-10 ENCOUNTER — Encounter (HOSPITAL_COMMUNITY): Payer: PRIVATE HEALTH INSURANCE

## 2013-09-10 NOTE — ED Provider Notes (Signed)
Medical screening examination/treatment/procedure(s) were conducted as a shared visit with non-physician practitioner(s) and myself.  I personally evaluated the patient during the encounter.  EKG Interpretation   None       patient with acute on chronic leg pain. Multiple visits for leg pain, but she states it recently started swelling. Given her DVT hx and recent subtherapeutic INR will given one dose of lovenox and return in AM for DVT u/s.   Audree Camel, MD 09/10/13 [phone]

## 2013-09-10 NOTE — Patient Instructions (Signed)
Take Coumadin 7.5mg  (1&1/2 tablets) today.  Then resume coumadin 5mg  (1 tablet) daily on 09/10/13.   Recheck INR on 09/21/13: lab at 10:30 am and coumadin clinic at 10:45 am.

## 2013-09-10 NOTE — Progress Notes (Signed)
INR subtherapeutic from ED visit on 09/07/13. No missed doses.  No unusual bruising. No bleeding. Pt not taking hydrochlorothiazide. Pt states she is"not eating much." Her legs have not been bothering her - no swelling, redness, warmth or tenderness. No further leg pains. Still having back pain issues. She was given "generic" Vicodin which does not do well with her. She feels Dilaudid works better She has acid reflux. She also feels something is broke in her back. Pt plans to review with new PCP (she has not identified this MD) in [place] at [facility]. Pt usually fairly stable on 5mg  daily. Take Coumadin 7.5mg  today. Then resume coumadin 5mg  daily on 09/10/13.   Recheck INR on 09/21/13: lab at 10:30 am and coumadin clinic at 10:45 am. Pt understood and repeated plan. Telephone encounter - no charge.

## 2013-09-15 ENCOUNTER — Emergency Department (HOSPITAL_COMMUNITY)
Admission: EM | Admit: 2013-09-15 | Discharge: 2013-09-15 | Disposition: A | Discharge status: Home / Self Care | Payer: PRIVATE HEALTH INSURANCE | Attending: Emergency Medicine | Admitting: Emergency Medicine

## 2013-09-15 ENCOUNTER — Encounter (HOSPITAL_COMMUNITY): Payer: Self-pay | Admitting: Emergency Medicine

## 2013-09-15 DIAGNOSIS — Z8739 Personal history of other diseases of the musculoskeletal system and connective tissue: Secondary | ICD-10-CM | POA: Insufficient documentation

## 2013-09-15 DIAGNOSIS — R12 Heartburn: Secondary | ICD-10-CM | POA: Insufficient documentation

## 2013-09-15 DIAGNOSIS — Z862 Personal history of diseases of the blood and blood-forming organs and certain disorders involving the immune mechanism: Secondary | ICD-10-CM | POA: Insufficient documentation

## 2013-09-15 DIAGNOSIS — K219 Gastro-esophageal reflux disease without esophagitis: Secondary | ICD-10-CM | POA: Insufficient documentation

## 2013-09-15 DIAGNOSIS — R011 Cardiac murmur, unspecified: Secondary | ICD-10-CM | POA: Insufficient documentation

## 2013-09-15 DIAGNOSIS — Z791 Long term (current) use of non-steroidal anti-inflammatories (NSAID): Secondary | ICD-10-CM | POA: Insufficient documentation

## 2013-09-15 DIAGNOSIS — Z8701 Personal history of pneumonia (recurrent): Secondary | ICD-10-CM | POA: Insufficient documentation

## 2013-09-15 DIAGNOSIS — F3289 Other specified depressive episodes: Secondary | ICD-10-CM | POA: Insufficient documentation

## 2013-09-15 DIAGNOSIS — Z8639 Personal history of other endocrine, nutritional and metabolic disease: Secondary | ICD-10-CM | POA: Insufficient documentation

## 2013-09-15 DIAGNOSIS — Z86711 Personal history of pulmonary embolism: Secondary | ICD-10-CM | POA: Insufficient documentation

## 2013-09-15 DIAGNOSIS — Z86718 Personal history of other venous thrombosis and embolism: Secondary | ICD-10-CM | POA: Insufficient documentation

## 2013-09-15 DIAGNOSIS — J45909 Unspecified asthma, uncomplicated: Secondary | ICD-10-CM | POA: Insufficient documentation

## 2013-09-15 DIAGNOSIS — R197 Diarrhea, unspecified: Secondary | ICD-10-CM | POA: Insufficient documentation

## 2013-09-15 DIAGNOSIS — Z7901 Long term (current) use of anticoagulants: Secondary | ICD-10-CM | POA: Insufficient documentation

## 2013-09-15 DIAGNOSIS — F329 Major depressive disorder, single episode, unspecified: Secondary | ICD-10-CM | POA: Insufficient documentation

## 2013-09-15 DIAGNOSIS — G8929 Other chronic pain: Secondary | ICD-10-CM | POA: Insufficient documentation

## 2013-09-15 DIAGNOSIS — F209 Schizophrenia, unspecified: Secondary | ICD-10-CM | POA: Insufficient documentation

## 2013-09-15 DIAGNOSIS — Z872 Personal history of diseases of the skin and subcutaneous tissue: Secondary | ICD-10-CM | POA: Insufficient documentation

## 2013-09-15 DIAGNOSIS — F411 Generalized anxiety disorder: Secondary | ICD-10-CM | POA: Insufficient documentation

## 2013-09-15 DIAGNOSIS — Z79899 Other long term (current) drug therapy: Secondary | ICD-10-CM | POA: Insufficient documentation

## 2013-09-15 DIAGNOSIS — Z8669 Personal history of other diseases of the nervous system and sense organs: Secondary | ICD-10-CM | POA: Insufficient documentation

## 2013-09-15 DIAGNOSIS — I10 Essential (primary) hypertension: Secondary | ICD-10-CM | POA: Insufficient documentation

## 2013-09-15 LAB — COMPREHENSIVE METABOLIC PANEL
ALT: 29 U/L (ref 0–35)
AST: 33 U/L (ref 0–37)
Alkaline Phosphatase: 95 U/L (ref 39–117)
BUN: 9 mg/dL (ref 6–23)
CO2: 30 mEq/L (ref 19–32)
Calcium: 9.7 mg/dL (ref 8.4–10.5)
GFR calc Af Amer: >90 mL/min (ref >90)
GFR calc non Af Amer: 85 mL/min — ABNORMAL LOW (ref >90)
Glucose, Bld: 104 mg/dL — ABNORMAL HIGH (ref 70–99)
Potassium: 3.6 mEq/L (ref 3.5–5.1)
Total Bilirubin: 0.3 mg/dL (ref 0.3–1.2)
Total Protein: 8.3 g/dL (ref 6.0–8.3)

## 2013-09-15 LAB — CBC WITH DIFFERENTIAL/PLATELET
Basophils Absolute: 0 10*3/uL (ref 0.0–0.1)
Basophils Relative: 0 % (ref 0–1)
Eosinophils Relative: 1 % (ref 0–5)
HCT: 37.8 % (ref 36.0–46.0)
Lymphocytes Relative: 15 % (ref 12–46)
MCHC: 34.4 g/dL (ref 30.0–36.0)
MCV: 94.3 fL (ref 78.0–100.0)
Neutro Abs: 5.4 10*3/uL (ref 1.7–7.7)
Neutrophils Relative %: 81 % — ABNORMAL HIGH (ref 43–77)
Platelets: 278 10*3/uL (ref 150–400)
RDW: 14.6 % (ref 11.5–15.5)
WBC: 6.6 10*3/uL (ref 4.0–10.5)

## 2013-09-15 LAB — POCT I-STAT TROPONIN I: Troponin i, poc: 0 ng/mL (ref 0.00–0.08)

## 2013-09-15 MED ORDER — OXYCODONE-ACETAMINOPHEN 5-325 MG PO TABS
1.0000 | ORAL_TABLET | Freq: Once | ORAL | Status: AC
  Administered 2013-09-15: 1 via ORAL
  Filled 2013-09-15: qty 1

## 2013-09-15 NOTE — ED Provider Notes (Signed)
CSN: [id number]     Arrival date & time 09/15/13  1122 History   First MD Initiated Contact with Patient 09/15/13 1516     Chief Complaint  Patient presents with  . Heartburn  . Diarrhea  . Hypertension    HPI PT has been off of her blood pressure medications for a while.  She saw her primary care doctor yesterday and was told to start all of her medications again.  Pt states after taking them she started having side effects that she thinks it is from her medication.  SHe took atenolol, lisinopril and HCTZ.  Pt started having trouble yesterday with loose stools.  She did take 3 dulcolax and drank a lot of water prior to that.  She has had 6 episodes.  Last episode was last night.  None today. She also has felt trouble with acid reflux.  She experienced an irritation in her throat and felt like she something was caught.  No shortness of breath.  Her chest does hurt.  It goes up and down the center of her chest.  The mylanta helped some and then she tried pepcid.  She has been burping a lot. Past Medical History  Diagnosis Date  . Depression   . Schizophrenia   . Hyperlipidemia   . Hypertension   . Chronic pain     "over my whole body" (03/30/2013)  . 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 DVT (deep venous thrombosis)     per 01/17/10 d/c summary- "remote hx of dvt"?  . Iron deficiency anemia   . Glaucoma   . Hemorrhoids   . Asthma   . Anxiety   . GERD (gastroesophageal reflux disease)   . Psoriasis 04/29/2012    New onset evaluated by Dr Marylou Flesher, Dermatology, [facility] 6/13  Rx 0.1% Tacrolimus ointment  . Complication of anesthesia     "because I have sleep apnea" (03/30/2013)  . Heart murmur   . Chest pain, exertional   . Chronic bronchitis   . Sleep apnea     "waiting on my CPAP" (03/30/2013)  . Pneumonia     "a few times" (03/30/2013)  . Exertional shortness of breath     "& sometimes when laying down" (03/30/2013)  . Daily headache     "last 2 months"  (03/30/2013)  . Arthritis     "joints" (03/30/2013)  . Varicose veins of legs   . Psoriasis    Past Surgical History  Procedure Laterality Date  . Spinal fusion      2004  . Carpal tunnel release Bilateral   . Tubal ligation  1973  . Dilation and curettage of uterus  1970's    "once" (03/30/2013)  . 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 Joint replacement     Family History  Problem Relation Age of Onset  . Diabetes Sister   . Colon cancer Brother 40   History  Substance Use Topics  . Smoking status: Never Smoker   . Smokeless tobacco: Never Used  . Alcohol Use: No   OB History   Grav Para Term Preterm Abortions TAB SAB Ect Mult Living                 Review of Systems  All other systems reviewed and are negative.    Allergies  Tramadol and Vicodin  Home Medications   Current Outpatient Rx  Name  Route  Sig  Dispense  Refill  .  albuterol (VENTOLIN HFA) 108 (90 BASE) MCG/ACT inhaler   Inhalation   Inhale 2 puffs into the lungs 2 (two) times daily as needed for wheezing or shortness of breath.          . esomeprazole (NEXIUM) 40 MG capsule   Oral   Take 40 mg by mouth daily before breakfast.         . FLUoxetine (PROZAC) 20 MG capsule   Oral   Take 60 mg by mouth every morning.          . gabapentin (NEURONTIN) 100 MG capsule   Oral   Take 200 mg by mouth 3 (three) times daily.          . hydrochlorothiazide (HYDRODIURIL) 25 MG tablet   Oral   Take 25 mg by mouth every morning.         . meclizine (ANTIVERT) 25 MG tablet   Oral   Take 25 mg by mouth 3 (three) times daily as needed for dizziness.          . meloxicam (MOBIC) 15 MG tablet   Oral   Take 15 mg by mouth daily as needed for pain.          Marland Kitchen PRESCRIPTION MEDICATION   Topical   Apply 1 application topically daily. Silver sulfadiazine / triamcinolone cream= compounded by [facility] For psoriasis         . tacrolimus (PROTOPIC) 0.1 % ointment   Topical   Apply 1 application  topically 3 (three) times daily as needed (rash).          . warfarin (COUMADIN) 5 MG tablet   Oral   Take 5 mg by mouth every morning.           BP 182/84  Pulse 72  Temp(Src) 98.7 F (37.1 C) (Oral)  Resp 18  SpO2 95%  LMP 11/22/1996 Physical Exam  Nursing note and vitals reviewed. Constitutional: She appears well-developed and well-nourished. No distress.  HENT:  Head: Normocephalic and atraumatic.  Right Ear: External ear normal.  Left Ear: External ear normal.  Eyes: Conjunctivae are normal. Right eye exhibits no discharge. Left eye exhibits no discharge. No scleral icterus.  Neck: Neck supple. No tracheal deviation present.  Cardiovascular: Normal rate, regular rhythm and intact distal pulses.   Pulmonary/Chest: Effort normal and breath sounds normal. No stridor. No respiratory distress. She has no wheezes. She has no rales.  Abdominal: Soft. Bowel sounds are normal. She exhibits no distension. There is no tenderness. There is no rebound and no guarding.  Musculoskeletal: She exhibits tenderness. She exhibits no edema.  Varicose veins bilateral lower extrem   Neurological: She is alert. She has normal strength. No sensory deficit. Cranial nerve deficit:  no gross defecits noted. She exhibits normal muscle tone. She displays no seizure activity. Coordination normal.  Skin: Skin is warm and dry. No rash noted.  Psychiatric: She has a normal mood and affect.    ED Course  Procedures (including critical care time) Labs Review Labs Reviewed  COMPREHENSIVE METABOLIC PANEL - Abnormal; Notable for the following:    Glucose, Bld 104 (*)    GFR calc non Af Amer 85 (*)    All other components within normal limits  CBC WITH DIFFERENTIAL - Abnormal; Notable for the following:    Neutrophils Relative % 81 (*)    All other components within normal limits  URINALYSIS, ROUTINE W REFLEX MICROSCOPIC  POCT I-STAT TROPONIN I   Imaging Review No  results found.  EKG  Interpretation    Date/Time:  Tuesday September 15 2013 11:31:43 EST Ventricular Rate:  70 PR Interval:  166 QRS Duration: 78 QT Interval:  402 QTC Calculation: 434 R Axis:   53 Text Interpretation:  Normal sinus rhythm Normal ECG No significant change since last tracing Confirmed by Leroy Trim  MD-J, Destynie Toomey ([id number]) on 09/15/2013 3:23:40 PM           Medications  oxyCODONE-acetaminophen (PERCOCET/ROXICET) 5-325 MG per tablet 1 tablet (1 tablet Oral Given 09/15/13 1555)  as requested by the patient  MDM   1. Diarrhea    Pt has symptoms of diarrhea and urinary frequency which have resolved.  Pt is upset about the medications that she is on and feels that her doctor did not explain it well.  I tried to reassure her that the medications that she is currently taking are pretty standard. Patient also mentioned that is eating her diarrhea, she taken several Dulcolax and drank a lot of water. I think this is more than likely the cause of the loose stool she had yesterday rather than her blood pressure medications. she did ask about her chronic pain in the medication she should take for that. I recommended the patient followup with her primary care doctor and pain management doctors regarding that.  At this time there does not appear to be any evidence of an acute emergency medical condition and the patient appears stable for discharge with appropriate outpatient follow up.     Celene Kras, MD 09/15/13 1556

## 2013-09-15 NOTE — ED Notes (Signed)
Pt states started 3 new blood pressure medications yesterday and states now urinating a lot.  Reports weakness, heart burn, sore chest, and diarrhea

## 2013-09-16 DIAGNOSIS — F329 Major depressive disorder, single episode, unspecified: Secondary | ICD-10-CM | POA: Insufficient documentation

## 2013-09-16 DIAGNOSIS — Z862 Personal history of diseases of the blood and blood-forming organs and certain disorders involving the immune mechanism: Secondary | ICD-10-CM | POA: Insufficient documentation

## 2013-09-16 DIAGNOSIS — F3289 Other specified depressive episodes: Secondary | ICD-10-CM | POA: Insufficient documentation

## 2013-09-16 DIAGNOSIS — Z8701 Personal history of pneumonia (recurrent): Secondary | ICD-10-CM | POA: Insufficient documentation

## 2013-09-16 DIAGNOSIS — Z86711 Personal history of pulmonary embolism: Secondary | ICD-10-CM | POA: Insufficient documentation

## 2013-09-16 DIAGNOSIS — R011 Cardiac murmur, unspecified: Secondary | ICD-10-CM | POA: Insufficient documentation

## 2013-09-16 DIAGNOSIS — F411 Generalized anxiety disorder: Secondary | ICD-10-CM | POA: Insufficient documentation

## 2013-09-16 DIAGNOSIS — I10 Essential (primary) hypertension: Secondary | ICD-10-CM | POA: Insufficient documentation

## 2013-09-16 DIAGNOSIS — M129 Arthropathy, unspecified: Secondary | ICD-10-CM | POA: Insufficient documentation

## 2013-09-16 DIAGNOSIS — Z8669 Personal history of other diseases of the nervous system and sense organs: Secondary | ICD-10-CM | POA: Insufficient documentation

## 2013-09-16 DIAGNOSIS — G8929 Other chronic pain: Secondary | ICD-10-CM | POA: Insufficient documentation

## 2013-09-16 DIAGNOSIS — M7989 Other specified soft tissue disorders: Secondary | ICD-10-CM | POA: Insufficient documentation

## 2013-09-16 DIAGNOSIS — M545 Low back pain, unspecified: Secondary | ICD-10-CM | POA: Insufficient documentation

## 2013-09-16 DIAGNOSIS — Z8639 Personal history of other endocrine, nutritional and metabolic disease: Secondary | ICD-10-CM | POA: Insufficient documentation

## 2013-09-16 DIAGNOSIS — Z7901 Long term (current) use of anticoagulants: Secondary | ICD-10-CM | POA: Insufficient documentation

## 2013-09-16 DIAGNOSIS — Z872 Personal history of diseases of the skin and subcutaneous tissue: Secondary | ICD-10-CM | POA: Insufficient documentation

## 2013-09-16 DIAGNOSIS — Z86718 Personal history of other venous thrombosis and embolism: Secondary | ICD-10-CM | POA: Insufficient documentation

## 2013-09-16 DIAGNOSIS — J45909 Unspecified asthma, uncomplicated: Secondary | ICD-10-CM | POA: Insufficient documentation

## 2013-09-16 DIAGNOSIS — Z79899 Other long term (current) drug therapy: Secondary | ICD-10-CM | POA: Insufficient documentation

## 2013-09-16 DIAGNOSIS — K219 Gastro-esophageal reflux disease without esophagitis: Secondary | ICD-10-CM | POA: Insufficient documentation

## 2013-09-16 DIAGNOSIS — F209 Schizophrenia, unspecified: Secondary | ICD-10-CM | POA: Insufficient documentation

## 2013-09-17 ENCOUNTER — Encounter (HOSPITAL_COMMUNITY): Payer: Self-pay | Admitting: Emergency Medicine

## 2013-09-17 ENCOUNTER — Emergency Department (HOSPITAL_COMMUNITY)
Admission: EM | Admit: 2013-09-17 | Discharge: 2013-09-17 | Disposition: A | Discharge status: Home / Self Care | Payer: PRIVATE HEALTH INSURANCE | Attending: Emergency Medicine | Admitting: Emergency Medicine

## 2013-09-17 DIAGNOSIS — G8929 Other chronic pain: Secondary | ICD-10-CM

## 2013-09-17 NOTE — ED Notes (Signed)
Pt arrived to the ED with a complaint of leg pain.  Pt states she has had the pain for several months.  Pt is scheduled to go tot he pain management clinic in January.  Pt states her pain has been out of control for a while.  Pt seen at [facility] but percocet didn't take care of the pain.

## 2013-09-17 NOTE — ED Provider Notes (Signed)
CSN: [id number]     Arrival date & time 09/16/13  2353 History   First MD Initiated Contact with Patient 09/17/13 0155     Chief Complaint  Patient presents with  . Extremity Pain   (Consider location/radiation/quality/duration/timing/severity/associated sxs/prior Treatment) HPI Patient states she called EMS to take her to [facility] for narcotics for her chronic pain. She's not want to come to [facility] long as she is concerned she would not get pain medication. She states the pain is unchanged. She states that her primary MD will no longer give her narcotic prescriptions. She is followed by pain management doctor in [place]. Patient has been seen earlier this month for similar complaints and been told that she will not receive IV or PO narcotics from the emergency department for pain. Patient states that she is having the same pain that she's had for several months. She complains of low back pain and bilateral leg pain. This pain is unchanged. She has no new weakness or numbness. She has no new urinary symptoms including incontinence. She has no fevers or chills. Past Medical History  Diagnosis Date  . Depression   . Schizophrenia   . Hyperlipidemia   . Hypertension   . Chronic pain     "over my whole body" (03/30/2013)  . 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 DVT (deep venous thrombosis)     per 01/17/10 d/c summary- "remote hx of dvt"?  . Iron deficiency anemia   . Glaucoma   . Hemorrhoids   . Asthma   . Anxiety   . GERD (gastroesophageal reflux disease)   . Psoriasis 04/29/2012    New onset evaluated by Dr Marylou Flesher, Dermatology, [facility] 6/13  Rx 0.1% Tacrolimus ointment  . Complication of anesthesia     "because I have sleep apnea" (03/30/2013)  . Heart murmur   . Chest pain, exertional   . Chronic bronchitis   . Sleep apnea     "waiting on my CPAP" (03/30/2013)  . Pneumonia     "a few times" (03/30/2013)  . Exertional  shortness of breath     "& sometimes when laying down" (03/30/2013)  . Daily headache     "last 2 months" (03/30/2013)  . Arthritis     "joints" (03/30/2013)  . Varicose veins of legs   . Psoriasis    Past Surgical History  Procedure Laterality Date  . Spinal fusion      2004  . Carpal tunnel release Bilateral   . Tubal ligation  1973  . Dilation and curettage of uterus  1970's    "once" (03/30/2013)  . 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 Joint replacement     Family History  Problem Relation Age of Onset  . Diabetes Sister   . Colon cancer Brother 40   History  Substance Use Topics  . Smoking status: Never Smoker   . Smokeless tobacco: Never Used  . Alcohol Use: No   OB History   Grav Para Term Preterm Abortions TAB SAB Ect Mult Living                 Review of Systems  Constitutional: Negative for fever and chills.  Respiratory: Negative for shortness of breath.   Cardiovascular: Positive for leg swelling. Negative for chest pain.  Gastrointestinal: Negative for nausea, vomiting, abdominal pain, diarrhea and constipation.  Genitourinary: Negative for dysuria and difficulty urinating.  Musculoskeletal: Positive for  back pain. Negative for neck pain and neck stiffness.  Skin: Negative for rash and wound.  Neurological: Negative for dizziness, weakness and numbness.  All other systems reviewed and are negative.    Allergies  Tramadol and Vicodin  Home Medications   Current Outpatient Rx  Name  Route  Sig  Dispense  Refill  . albuterol (VENTOLIN HFA) 108 (90 BASE) MCG/ACT inhaler   Inhalation   Inhale 2 puffs into the lungs 2 (two) times daily as needed for wheezing or shortness of breath.          Marland Kitchen atenolol (TENORMIN) 100 MG tablet   Oral   Take 100 mg by mouth daily.          Marland Kitchen esomeprazole (NEXIUM) 40 MG capsule   Oral   Take 40 mg by mouth daily before breakfast.         . FLUoxetine (PROZAC) 20 MG capsule   Oral   Take 60 mg by mouth every  morning.          . gabapentin (NEURONTIN) 100 MG capsule   Oral   Take 200 mg by mouth 3 (three) times daily.          . hydrochlorothiazide (HYDRODIURIL) 25 MG tablet   Oral   Take 25 mg by mouth every morning.         Marland Kitchen HYDROcodone-acetaminophen (NORCO/VICODIN) 5-325 MG per tablet   Oral   Take 1 tablet by mouth every 6 (six) hours as needed.          Marland Kitchen lisinopril (PRINIVIL,ZESTRIL) 10 MG tablet   Oral   Take 10 mg by mouth daily.          . meclizine (ANTIVERT) 25 MG tablet   Oral   Take 25 mg by mouth 3 (three) times daily as needed for dizziness.          . meloxicam (MOBIC) 15 MG tablet   Oral   Take 15 mg by mouth daily as needed for pain.          Marland Kitchen oxyCODONE-acetaminophen (PERCOCET/ROXICET) 5-325 MG per tablet   Oral   Take 1 tablet by mouth every 8 (eight) hours as needed.          Marland Kitchen PRESCRIPTION MEDICATION   Topical   Apply 1 application topically daily. Silver sulfadiazine / triamcinolone cream= compounded by [facility] For psoriasis         . tacrolimus (PROTOPIC) 0.1 % ointment   Topical   Apply 1 application topically 3 (three) times daily as needed (rash).          . warfarin (COUMADIN) 5 MG tablet   Oral   Take 5 mg by mouth every morning.           BP 127/81  Pulse 69  Temp(Src) 98.6 F (37 C) (Oral)  Resp 18  SpO2 98%  LMP 11/22/1996 Physical Exam  Nursing note and vitals reviewed. Constitutional: She is oriented to person, place, and time. She appears well-developed and well-nourished. No distress.  Patient is resting comfortably on the hospital bed  HENT:  Head: Normocephalic and atraumatic.  Mouth/Throat: Oropharynx is clear and moist.  Eyes: EOM are normal. Pupils are equal, round, and reactive to light.  Neck: Normal range of motion. Neck supple.  Cardiovascular: Normal rate and regular rhythm.   Pulmonary/Chest: Effort normal and breath sounds normal. No respiratory distress. She has no wheezes. She has no rales.  She exhibits no tenderness.  Abdominal: Soft. Bowel sounds are normal. She exhibits no distension and no mass. There is no tenderness. There is no rebound and no guarding.  Musculoskeletal: Normal range of motion. She exhibits tenderness (mild lumbar paraspinal tenderness to palpation without midline tenderness. Next right leg raise to. She's lower sugary tenderness with distended varicose veins.). She exhibits no edema.  Distal pulses intact.  Neurological: She is alert and oriented to person, place, and time.  Patient is alert and oriented x3 with clear, goal oriented speech. Patient has 5/5 motor in all extremities. Sensation is intact to light touch. Patient has a normal gait and walks without assistance.   Skin: Skin is warm and dry. No rash noted. No erythema.  Psychiatric: She has a normal mood and affect. Her behavior is normal.    ED Course  Procedures (including critical care time) Labs Review Labs Reviewed - No data to display Imaging Review No results found.  EKG Interpretation   None       MDM   1. Chronic pain    Have reiterated to the patient in the emergency department is not appropriate for chronic pain management. I advised her to followup with her pain management doctor. Return precautions have been given.    Loren Racer, MD 09/17/13 [phone]

## 2013-09-17 NOTE — ED Notes (Signed)
Pt expressing discontent to her care.  Pt declined her discharge paperwork.  Pt ambulated out of the department.

## 2013-09-18 ENCOUNTER — Emergency Department (INDEPENDENT_AMBULATORY_CARE_PROVIDER_SITE_OTHER)
Admission: EM | Admit: 2013-09-18 | Discharge: 2013-09-18 | Disposition: A | Discharge status: Home / Self Care | Payer: PRIVATE HEALTH INSURANCE | Source: Home / Self Care | Attending: Family Medicine | Admitting: Family Medicine

## 2013-09-18 ENCOUNTER — Encounter (HOSPITAL_COMMUNITY): Payer: Self-pay | Admitting: Emergency Medicine

## 2013-09-18 DIAGNOSIS — M79609 Pain in unspecified limb: Secondary | ICD-10-CM

## 2013-09-18 DIAGNOSIS — M79604 Pain in right leg: Secondary | ICD-10-CM

## 2013-09-18 MED ORDER — TRAMADOL HCL 50 MG PO TABS: 50.0000 mg | ORAL_TABLET | Freq: Four times a day (QID) | ORAL | Status: DC | PRN

## 2013-09-18 NOTE — ED Notes (Signed)
Pt c/o chronic back pain... Has been recently in the ED for similar sxs Has appt w/a medical provider to start steroid inj next month (10/03/13)... Also c/o psoriasis flare up.  She is alert w/no signs of acute distress... Ambulated well to exam room w/NAD

## 2013-09-18 NOTE — ED Provider Notes (Signed)
[name] [name] is a 72 y.o. female who presents to Urgent Care today for lower leg pain. Patient has pain in both legs radiating from low back. She has a long history of chronic back pain with radiculopathy. She is currently being seen by pain management clinic for nonnarcotic management of her back pain with intermittent epidural steroid injections. She has one of these scheduled in about 2 weeks.  She attributes her pain to psoriasis which is currently controlled with topical medications by a dermatologist. Additionally she does have varicose veins which are contributing to some of her pain. She has been evaluated for DVTs recently but is currently on lifelong warfarin for history of several previous DVT.   She notes that she has been on tramadol the past and that seems to work well. She states that she does not have medication contract with her primary care doctor, or pain Dr.   Past Medical History  Diagnosis Date  . Depression   . Schizophrenia   . Hyperlipidemia   . Hypertension   . Chronic pain     "over my whole body" (03/30/2013)  . 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 DVT (deep venous thrombosis)     per 01/17/10 d/c summary- "remote hx of dvt"?  . Iron deficiency anemia   . Glaucoma   . Hemorrhoids   . Asthma   . Anxiety   . GERD (gastroesophageal reflux disease)   . Psoriasis 04/29/2012    New onset evaluated by Dr Marylou Flesher, Dermatology, [facility] 6/13  Rx 0.1% Tacrolimus ointment  . Complication of anesthesia     "because I have sleep apnea" (03/30/2013)  . Heart murmur   . Chest pain, exertional   . Chronic bronchitis   . Sleep apnea     "waiting on my CPAP" (03/30/2013)  . Pneumonia     "a few times" (03/30/2013)  . Exertional shortness of breath     "& sometimes when laying down" (03/30/2013)  . Daily headache     "last 2 months" (03/30/2013)  . Arthritis     "joints" (03/30/2013)  . Varicose veins of legs   . Psoriasis    History  Substance  Use Topics  . Smoking status: Never Smoker   . Smokeless tobacco: Never Used  . Alcohol Use: No   ROS as above Medications reviewed. No current facility-administered medications for this encounter.   Current Outpatient Prescriptions  Medication Sig Dispense Refill  . albuterol (VENTOLIN HFA) 108 (90 BASE) MCG/ACT inhaler Inhale 2 puffs into the lungs 2 (two) times daily as needed for wheezing or shortness of breath.       Marland Kitchen atenolol (TENORMIN) 100 MG tablet Take 100 mg by mouth daily.       Marland Kitchen esomeprazole (NEXIUM) 40 MG capsule Take 40 mg by mouth daily before breakfast.      . FLUoxetine (PROZAC) 20 MG capsule Take 60 mg by mouth every morning.       . gabapentin (NEURONTIN) 100 MG capsule Take 200 mg by mouth 3 (three) times daily.       . hydrochlorothiazide (HYDRODIURIL) 25 MG tablet Take 25 mg by mouth every morning.      Marland Kitchen HYDROcodone-acetaminophen (NORCO/VICODIN) 5-325 MG per tablet Take 1 tablet by mouth every 6 (six) hours as needed.       Marland Kitchen lisinopril (PRINIVIL,ZESTRIL) 10 MG tablet Take 10 mg by mouth daily.       Marland Kitchen  meclizine (ANTIVERT) 25 MG tablet Take 25 mg by mouth 3 (three) times daily as needed for dizziness.       . meloxicam (MOBIC) 15 MG tablet Take 15 mg by mouth daily as needed for pain.       Marland Kitchen oxyCODONE-acetaminophen (PERCOCET/ROXICET) 5-325 MG per tablet Take 1 tablet by mouth every 8 (eight) hours as needed.       Marland Kitchen PRESCRIPTION MEDICATION Apply 1 application topically daily. Silver sulfadiazine / triamcinolone cream= compounded by [facility] For psoriasis      . tacrolimus (PROTOPIC) 0.1 % ointment Apply 1 application topically 3 (three) times daily as needed (rash).       . traMADol (ULTRAM) 50 MG tablet Take 1 tablet (50 mg total) by mouth every 6 (six) hours as needed.  20 tablet  0  . warfarin (COUMADIN) 5 MG tablet Take 5 mg by mouth every morning.         Exam:  BP 192/88  Pulse 61  Temp(Src) 98.5 F (36.9 C) (Oral)  Resp 18  SpO2 100%  LMP  11/22/1996 Gen: Well NAD LOWER extremities bilaterally:  Sensation is intact throughout. Capillary fill is also intact. While appearing hyperpigmented rash on the left lower extremity. Varicose veins are present in the left lower extremity inner not particularly tender. Calf is nontender with normal motion. Pulses are intact  Patient walks with a stooped over gait using a cane   Assessment and Plan: 72 y.o. female with chronic radicular pain. We'll prescribe small amount of tramadol and refer back to primary care provider. Discussed warning signs or symptoms. Please see discharge instructions. Patient expresses understanding.      Rodolph Bong, MD 09/18/13 [phone]

## 2013-09-21 ENCOUNTER — Other Ambulatory Visit: Payer: PRIVATE HEALTH INSURANCE

## 2013-09-21 ENCOUNTER — Telehealth: Payer: Self-pay | Admitting: Pharmacist

## 2013-09-21 ENCOUNTER — Ambulatory Visit: Payer: PRIVATE HEALTH INSURANCE

## 2013-09-21 NOTE — Telephone Encounter (Signed)
Patient FTKA today for lab and coumadin clinic visits. Pt went to ED on 09/18/13 for leg/back pain. Called and spoke to patient's Spouse. [name] [name] is not feeling well today and is still in pain and was unable to make her appointment. She will call back to reschedule  Christell Faith, PharmD

## 2013-09-29 ENCOUNTER — Telehealth: Payer: Self-pay | Admitting: *Deleted

## 2013-09-29 NOTE — Telephone Encounter (Signed)
Pt called asking about missed appts & wanted to speak to coumadin clinic pharmacist.  She states that She wanted to see Dr. Cyndie ChimeGranfortuna before injection scheduled for tomorrow in her back [facility]  She was told that it was safe with coumadin.  Transferred call to coumadin clinic/Chris RPH Discussed with Dr Cyndie ChimeGranfortuna & called pt back & informed not to get injection tomorrow per Dr Cyndie ChimeGranfortuna & to have MD call Dr. Cyndie ChimeGranfortuna.  She expressed understanding & states she trusts Dr Cyndie ChimeGranfortuna.

## 2013-09-30 ENCOUNTER — Telehealth: Payer: Self-pay | Admitting: *Deleted

## 2013-09-30 NOTE — Telephone Encounter (Signed)
Patient left message to call her as she had the name of the clinic.   Called her back and left message that per Myrtle's note from yesterday,  Patient is to request that MD give Dr. Cyndie ChimeGranfortuna a call.

## 2013-10-05 ENCOUNTER — Telehealth: Payer: Self-pay | Admitting: Pharmacist

## 2013-10-05 NOTE — Telephone Encounter (Signed)
Pt called pharmacy to confirm her lab/CC appt tomorrow. She stated she wants to make appt w/ Dr. Cyndie ChimeGranfortuna before she has epidural inj. She will need to go to scheduling before she leaves here tomorrow. Ebony HailGinna Eliott Amparan, Pharm.D., CPP 10/05/2013@11 :37 AM

## 2013-10-06 ENCOUNTER — Telehealth: Payer: Self-pay | Admitting: Pharmacist

## 2013-10-06 ENCOUNTER — Ambulatory Visit: Payer: PRIVATE HEALTH INSURANCE

## 2013-10-06 ENCOUNTER — Other Ambulatory Visit: Payer: PRIVATE HEALTH INSURANCE

## 2013-10-06 NOTE — Telephone Encounter (Signed)
Patient called today to cancel coumadin clinic visit. Pt states she is tired and not feeling well today and does not want to get sick being out due to the weather. Pt states she will call back later this week to reschedule.  Thank you, Christell Faithhris Abel Ra, PharmD

## 2013-10-13 ENCOUNTER — Telehealth: Payer: Self-pay | Admitting: *Deleted

## 2013-10-13 NOTE — Telephone Encounter (Signed)
Received call from pt & she wants to get injections for her back & needs to see Dr Cyndie ChimeGranfortuna sooner that scheduled appt in March with Lonna CobbLisa Thomas NP.  She had asked the MD to call Dr Cyndie ChimeGranfortuna & will check to see if he has done this.  She will be in thurs for coumadin clinic & will bring her MD's information with her. She reports having a lot of lower back pain.  She can be reached at [phone].  Note to Dr Cyndie ChimeGranfortuna.

## 2013-10-14 ENCOUNTER — Other Ambulatory Visit (HOSPITAL_BASED_OUTPATIENT_CLINIC_OR_DEPARTMENT_OTHER): Payer: PRIVATE HEALTH INSURANCE

## 2013-10-14 ENCOUNTER — Ambulatory Visit (HOSPITAL_BASED_OUTPATIENT_CLINIC_OR_DEPARTMENT_OTHER): Payer: Self-pay | Admitting: Pharmacist

## 2013-10-14 DIAGNOSIS — D6859 Other primary thrombophilia: Secondary | ICD-10-CM

## 2013-10-14 DIAGNOSIS — Z7901 Long term (current) use of anticoagulants: Secondary | ICD-10-CM

## 2013-10-14 LAB — PROTIME-INR
INR: 2 (ref 2.00–3.50)
Protime: 24 Seconds — ABNORMAL HIGH (ref 10.6–13.4)

## 2013-10-14 LAB — POCT INR: INR: 2

## 2013-10-14 NOTE — Progress Notes (Signed)
INR = 2    Goal range 2-3 No complications of anticoagulation noted. No medication or dietary changes. Patient states she [facility] but refused tx until after she talks with Dr. Cyndie ChimeGranfortuna. She has the forms with her today for Dr. Patsy LagerGranfortuna's nurse, and states she will not proceed without his approval. I told RN to contact me today if patient is to have a procedure and if we are to assist with anticoagulation dosing around procedure. Patient will continue Coumadin 5 mg daily.  She will be seen on 11/11/13 at 11am for labs, 11:15 for Coumadin clinic.  Cletis AthensLisa Shakya Sebring, PharmD

## 2013-10-15 ENCOUNTER — Telehealth: Payer: Self-pay | Admitting: Oncology

## 2013-10-15 NOTE — Telephone Encounter (Signed)
Sent office note to Dr. Romeo RabonKamal Ajam office.

## 2013-10-15 NOTE — Telephone Encounter (Signed)
Sent office note to Dr. Dorothy SparkKatharine Batt office.

## 2013-10-21 ENCOUNTER — Encounter (HOSPITAL_COMMUNITY): Payer: Self-pay | Admitting: Emergency Medicine

## 2013-10-21 ENCOUNTER — Emergency Department (INDEPENDENT_AMBULATORY_CARE_PROVIDER_SITE_OTHER)
Admission: EM | Admit: 2013-10-21 | Discharge: 2013-10-21 | Disposition: A | Discharge status: Home / Self Care | Payer: PRIVATE HEALTH INSURANCE | Source: Home / Self Care

## 2013-10-21 DIAGNOSIS — J42 Unspecified chronic bronchitis: Secondary | ICD-10-CM

## 2013-10-21 DIAGNOSIS — I10 Essential (primary) hypertension: Secondary | ICD-10-CM

## 2013-10-21 DIAGNOSIS — R0982 Postnasal drip: Secondary | ICD-10-CM

## 2013-10-21 DIAGNOSIS — R05 Cough: Secondary | ICD-10-CM

## 2013-10-21 DIAGNOSIS — R059 Cough, unspecified: Secondary | ICD-10-CM

## 2013-10-21 MED ORDER — ESOMEPRAZOLE MAGNESIUM 40 MG PO CPDR: 40.0000 mg | DELAYED_RELEASE_CAPSULE | Freq: Every day | ORAL | Status: DC

## 2013-10-21 MED ORDER — FEXOFENADINE HCL 60 MG PO TABS: 60.0000 mg | ORAL_TABLET | Freq: Two times a day (BID) | ORAL | Status: DC

## 2013-10-21 MED ORDER — ALBUTEROL SULFATE HFA 108 (90 BASE) MCG/ACT IN AERS: 2.0000 | INHALATION_SPRAY | RESPIRATORY_TRACT | Status: DC | PRN

## 2013-10-21 NOTE — ED Notes (Signed)
Pt  Reports  Symptoms   Of  Cough  /  Congestion       And  Drainage    With  Symptoms         X  sev  Weeks            Pt  Reports  Symptoms  Of             Non  Productive  Cough  As  Well                she  Is  Also  Out of her bp  meds

## 2013-10-21 NOTE — Discharge Instructions (Signed)
Cough, Adult  A cough is a reflex. It helps you clear your throat and airways. A cough can help heal your body. A cough can last 2 or 3 weeks (acute) or may last more than 8 weeks (chronic). Some common causes of a cough can include an infection, allergy, or a cold. HOME CARE  Only take medicine as told by your doctor.  If given, take your medicines (antibiotics) as told. Finish them even if you start to feel better.  Use a cold steam vaporizer or humidier in your home. This can help loosen thick spit (secretions).  Sleep so you are almost sitting up (semi-upright). Use pillows to do this. This helps reduce coughing.  Rest as needed.  Stop smoking if you smoke. GET HELP RIGHT AWAY IF:  You have yellowish-white fluid (pus) in your thick spit.  Your cough gets worse.  Your medicine does not reduce coughing, and you are losing sleep.  You cough up blood.  You have trouble breathing.  Your pain gets worse and medicine does not help.  You have a fever. MAKE SURE YOU:   Understand these instructions.  Will watch your condition.  Will get help right away if you are not doing well or get worse. Document Released: 05/24/2011 Document Revised: 12/03/2011 Document Reviewed: 05/24/2011 [facility] Patient Information 2014 [place], [place].  Bronchitis Bronchitis is swelling (inflammation) of the air tubes leading to your lungs (bronchi). This causes mucus and a cough. If the swelling gets bad, you may have trouble breathing. HOME CARE   Rest.  Drink enough fluids to keep your pee (urine) clear or pale yellow (unless you have a condition where you have to watch how much you drink).  Only take medicine as told by your doctor. If you were given antibiotic medicines, finish them even if you start to feel better.  Avoid smoke, irritating chemicals, and strong smells. These make the problem worse. Quit smoking if you smoke. This helps your lungs heal faster.  Use a cool mist humidifier.  Change the water in the humidifier every day. You can also sit in the bathroom with hot shower running for 5 10 minutes. Keep the door closed.  See your health care provider as told.  Wash your hands often. GET HELP IF: Your problems do not get better after 1 week. GET HELP RIGHT AWAY IF:   Your fever gets worse.  You have chills.  Your chest hurts.  Your problems breathing get worse.  You have blood in your mucus.  You pass out (faint).  You feel lightheaded.  You have a bad headache.  You throw up (vomit) again and again. MAKE SURE YOU:  Understand these instructions.  Will watch your condition.  Will get help right away if you are not doing well or get worse. Document Released: 02/27/2008 Document Revised: 07/01/2013 Document Reviewed: 05/05/2013 [facility] Patient Information 2014 [place], [place].  Hypertension Hypertension is another name for high blood pressure. High blood pressure may mean that your heart needs to work harder to pump blood. Blood pressure consists of two numbers, which includes a higher number over a lower number (example: 110/72). HOME CARE   Make lifestyle changes as told by your doctor. This may include weight loss and exercise.  Take your blood pressure medicine every day.  Limit how much salt you use.  Stop smoking if you smoke.  Do not use drugs.  Talk to your doctor if you are using decongestants or birth control pills. These medicines might  make blood pressure higher.  Females should not drink more than 1 alcoholic drink per day. Males should not drink more than 2 alcoholic drinks per day.  See your doctor as told. GET HELP RIGHT AWAY IF:   You have a blood pressure reading with a top number of 180 or higher.  You get a very bad headache.  You get blurred or changing vision.  You feel confused.  You feel weak, numb, or faint.  You get chest or belly (abdominal) pain.  You throw up (vomit).  You cannot breathe very  well. MAKE SURE YOU:   Understand these instructions.  Will watch your condition.  Will get help right away if you are not doing well or get worse. Document Released: 02/27/2008 Document Revised: 12/03/2011 Document Reviewed: 02/27/2008 [facility] Patient Information 2014 [place], [place].

## 2013-10-21 NOTE — ED Provider Notes (Signed)
Medical screening examination/treatment/procedure(s) were performed by non-physician practitioner and as supervising physician I was immediately available for consultation/collaboration.  Jilberto Vanderwall, M.D.   Cobie Leidner C Kamy Poinsett, MD 10/21/13 1255 

## 2013-10-21 NOTE — ED Provider Notes (Signed)
CSN: [id number]     Arrival date & time 10/21/13  1018 History   First MD Initiated Contact with Patient 10/21/13 1126     Chief Complaint  Patient presents with  . Cough   (Consider location/radiation/quality/duration/timing/severity/associated sxs/prior Treatment) HPI Comments: [name] [name] is a 73 y.o. female  with a hx of depression, schizophrenia, hypertension, hyperlipidemia, prior PE and DVT currently on Coumadin, psoriasis, chronic back pain and bilateral lower extremity pain, and varicose vein. Presents today to have refilles on her blood pressure medication, Nexium and Ventolin. She is apparently in between physicians as she has stopped seeing her own PCP and is preparing to be seen at the call family practice. She also complains of tingling in drainage and call in her throat for one year since she moved into a new home. There are no new complaints for today.   Past Medical History  Diagnosis Date  . Depression   . Schizophrenia   . Hyperlipidemia   . Hypertension   . Chronic pain     "over my whole body" (03/30/2013)  . 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 DVT (deep venous thrombosis)     per 01/17/10 d/c summary- "remote hx of dvt"?  . Iron deficiency anemia   . Glaucoma   . Hemorrhoids   . Asthma   . Anxiety   . GERD (gastroesophageal reflux disease)   . Psoriasis 04/29/2012    New onset evaluated by Dr Marylou Flesher, Dermatology, [facility] 6/13  Rx 0.1% Tacrolimus ointment  . Complication of anesthesia     "because I have sleep apnea" (03/30/2013)  . Heart murmur   . Chest pain, exertional   . Chronic bronchitis   . Sleep apnea     "waiting on my CPAP" (03/30/2013)  . Pneumonia     "a few times" (03/30/2013)  . Exertional shortness of breath     "& sometimes when laying down" (03/30/2013)  . Daily headache     "last 2 months" (03/30/2013)  . Arthritis     "joints" (03/30/2013)  . Varicose veins of legs   . Psoriasis    Past Surgical History   Procedure Laterality Date  . Spinal fusion      2004  . Carpal tunnel release Bilateral   . Tubal ligation  1973  . Dilation and curettage of uterus  1970's    "once" (03/30/2013)  . 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 Joint replacement     Family History  Problem Relation Age of Onset  . Diabetes Sister   . Colon cancer Brother 40   History  Substance Use Topics  . Smoking status: Never Smoker   . Smokeless tobacco: Never Used  . Alcohol Use: No   OB History   Grav Para Term Preterm Abortions TAB SAB Ect Mult Living                 Review of Systems  Constitutional: Negative for fever, activity change and fatigue.  HENT: Positive for postnasal drip. Negative for congestion and sore throat.   Gastrointestinal: Negative for nausea, vomiting and abdominal pain.       Reflux symptoms  Genitourinary: Negative.   Skin: Negative.   Neurological: Negative.     Allergies  Tramadol and Vicodin  Home Medications   Current Outpatient Rx  Name  Route  Sig  Dispense  Refill  . albuterol (PROVENTIL HFA;VENTOLIN HFA) 108 (90 BASE) MCG/ACT inhaler  Inhalation   Inhale 2 puffs into the lungs every 4 (four) hours as needed for wheezing or shortness of breath.   1 Inhaler   0   . albuterol (VENTOLIN HFA) 108 (90 BASE) MCG/ACT inhaler   Inhalation   Inhale 2 puffs into the lungs 2 (two) times daily as needed for wheezing or shortness of breath.          Marland Kitchen atenolol (TENORMIN) 100 MG tablet   Oral   Take 100 mg by mouth daily.          Marland Kitchen esomeprazole (NEXIUM) 40 MG capsule   Oral   Take 1 capsule (40 mg total) by mouth daily.   30 capsule   0   . fexofenadine (ALLEGRA) 60 MG tablet   Oral   Take 1 tablet (60 mg total) by mouth 2 (two) times daily.   60 tablet   0   . FLUoxetine (PROZAC) 20 MG capsule   Oral   Take 60 mg by mouth every morning.          . gabapentin (NEURONTIN) 100 MG capsule   Oral   Take 200 mg by mouth 3 (three) times daily.           . hydrochlorothiazide (HYDRODIURIL) 25 MG tablet   Oral   Take 25 mg by mouth every morning.         Marland Kitchen HYDROcodone-acetaminophen (NORCO/VICODIN) 5-325 MG per tablet   Oral   Take 1 tablet by mouth every 6 (six) hours as needed.          Marland Kitchen lisinopril (PRINIVIL,ZESTRIL) 10 MG tablet   Oral   Take 10 mg by mouth daily.          . meclizine (ANTIVERT) 25 MG tablet   Oral   Take 25 mg by mouth 3 (three) times daily as needed for dizziness.          . meloxicam (MOBIC) 15 MG tablet   Oral   Take 15 mg by mouth daily as needed for pain.          Marland Kitchen oxyCODONE-acetaminophen (PERCOCET/ROXICET) 5-325 MG per tablet   Oral   Take 1 tablet by mouth every 8 (eight) hours as needed.          Marland Kitchen PRESCRIPTION MEDICATION   Topical   Apply 1 application topically daily. Silver sulfadiazine / triamcinolone cream= [facility] For psoriasis         . tacrolimus (PROTOPIC) 0.1 % ointment   Topical   Apply 1 application topically 3 (three) times daily as needed (rash).          . traMADol (ULTRAM) 50 MG tablet   Oral   Take 1 tablet (50 mg total) by mouth every 6 (six) hours as needed.   20 tablet   0   . warfarin (COUMADIN) 5 MG tablet   Oral   Take 5 mg by mouth every morning.           BP 181/90  Pulse 72  Temp(Src) 98 F (36.7 C) (Oral)  Resp 18  Ht 5\' 5"  (1.651 m)  Wt 240 lb (108.863 kg)  BMI 39.94 kg/m2  SpO2 97%  LMP 11/22/1996 Physical Exam  Nursing note and vitals reviewed. Constitutional: She is oriented to person, place, and time. She appears well-developed and well-nourished. No distress.  Morbidly obese  HENT:  Mouth/Throat: Oropharynx is clear and moist. No oropharyngeal exudate.   clear PND  Eyes:  EOM are normal.  Neck: Normal range of motion. Neck supple.  Cardiovascular: Normal rate, regular rhythm and normal heart sounds.   Pulmonary/Chest: Effort normal and breath sounds normal. No respiratory distress. She has no wheezes. She has  no rales.  Neurological: She is alert and oriented to person, place, and time. She exhibits normal muscle tone.  Skin: Skin is warm.  Psychiatric: She has a normal mood and affect.    ED Course  Procedures (including critical care time) Labs Review Labs Reviewed - No data to display Imaging Review No results found.    MDM   1. PND (post-nasal drip)   2. Cough   3. Chronic bronchitis   4. HTN (hypertension)    Refill Nexium 40 mg Reveal albuterol HFA  Allegra 60 mg twice a day for PND and cough The blood pressure medicine which she is requesting new prescriptions actually had several refills remaining on her current bottles.   Hayden Rasmussenavid Zivah Mayr, NP 10/21/13 1201

## 2013-11-11 ENCOUNTER — Encounter (HOSPITAL_COMMUNITY): Payer: Self-pay | Admitting: Emergency Medicine

## 2013-11-11 ENCOUNTER — Other Ambulatory Visit: Payer: PRIVATE HEALTH INSURANCE

## 2013-11-11 ENCOUNTER — Emergency Department (INDEPENDENT_AMBULATORY_CARE_PROVIDER_SITE_OTHER)
Admission: EM | Admit: 2013-11-11 | Discharge: 2013-11-11 | Disposition: A | Discharge status: Home / Self Care | Payer: PRIVATE HEALTH INSURANCE | Source: Home / Self Care | Attending: Family Medicine | Admitting: Family Medicine

## 2013-11-11 ENCOUNTER — Ambulatory Visit: Payer: PRIVATE HEALTH INSURANCE

## 2013-11-11 DIAGNOSIS — G894 Chronic pain syndrome: Secondary | ICD-10-CM

## 2013-11-11 MED ORDER — TRAMADOL HCL 50 MG PO TABS: 50.0000 mg | ORAL_TABLET | Freq: Three times a day (TID) | ORAL | Status: DC | PRN

## 2013-11-11 MED ORDER — GABAPENTIN 100 MG PO CAPS: 200.0000 mg | ORAL_CAPSULE | Freq: Three times a day (TID) | ORAL | Status: DC

## 2013-11-11 NOTE — ED Notes (Signed)
Patient reports being in between pcp, left her last pcp, dr Babette Relictammy boyd on [address]-family practice.  Now waiting on a physician at family practice.  Patient has several specialist.  Goes to coumadin clinic-post poned appt from earlier today due to weather and has rescheduled this appt

## 2013-11-11 NOTE — ED Provider Notes (Signed)
CSN: [id number]     Arrival date & time 11/11/13  1707 History   First MD Initiated Contact with Patient 11/11/13 1856     Chief Complaint  Patient presents with  . Medication Refill     (Consider location/radiation/quality/duration/timing/severity/associated sxs/prior Treatment) HPI  73 year old F with complicated medical hx including chronic pain, schizophrenia, and obesity who presents asking for a refill of medication. Specifically, she needs refills of her gabapentin and tramadol for management of chronic pain due to arthritis and varicose veins.   Past Medical History  Diagnosis Date  . Depression   . Schizophrenia   . Hyperlipidemia   . Hypertension   . Chronic pain     "over my whole body" (03/30/2013)  . 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 DVT (deep venous thrombosis)     per 01/17/10 d/c summary- "remote hx of dvt"?  . Iron deficiency anemia   . Glaucoma   . Hemorrhoids   . Asthma   . Anxiety   . GERD (gastroesophageal reflux disease)   . Psoriasis 04/29/2012    New onset evaluated by Dr Marylou FlesherWilliam Huang, [facility]ermat[facility] 6/13  Rx 0.1% Tacrolimus ointment  . Complication of anesthesia     "because I have sleep apnea" (03/30/2013)  . Heart murmur   . Chest pain, exertional   . Chronic bronchitis   . Sleep apnea     "waiting on my CPAP" (03/30/2013)  . Pneumonia     "a few times" (03/30/2013)  . Exertional shortness of breath     "& sometimes when laying down" (03/30/2013)  . Daily headache     "last 2 months" (03/30/2013)  . Arthritis     "joints" (03/30/2013)  . Varicose veins of legs   . Psoriasis    Past Surgical History  Procedure Laterality Date  . Spinal fusion      2004  . Carpal tunnel release Bilateral   . Tubal ligation  1973  . Dilation and curettage of uterus  1970's    "once" (03/30/2013)  . 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 Joint replacement     Family History  Problem Relation Age of Onset  . Diabetes Sister    . Colon cancer Brother 40   History  Substance Use Topics  . Smoking status: Never Smoker   . Smokeless tobacco: Never Used  . Alcohol Use: No   OB History   Grav Para Term Preterm Abortions TAB SAB Ect Mult Living                 Review of Systems  Constitutional: Negative.   Respiratory: Negative.   Cardiovascular: Negative.   Gastrointestinal: Negative.   Musculoskeletal: Positive for myalgias.  Neurological: Negative.   Psychiatric/Behavioral:       Positive for stress      Allergies  Tramadol and Vicodin  Home Medications   Current Outpatient Rx  Name  Route  Sig  Dispense  Refill  . albuterol (PROVENTIL HFA;VENTOLIN HFA) 108 (90 BASE) MCG/ACT inhaler   Inhalation   Inhale 2 puffs into the lungs every 4 (four) hours as needed for wheezing or shortness of breath.   1 Inhaler   0   . albuterol (VENTOLIN HFA) 108 (90 BASE) MCG/ACT inhaler   Inhalation   Inhale 2 puffs into the lungs 2 (two) times daily as needed for wheezing or shortness of breath.          .Marland Kitchen  atenolol (TENORMIN) 100 MG tablet   Oral   Take 100 mg by mouth daily.          Marland Kitchen esomeprazole (NEXIUM) 40 MG capsule   Oral   Take 1 capsule (40 mg total) by mouth daily.   30 capsule   0   . fexofenadine (ALLEGRA) 60 MG tablet   Oral   Take 1 tablet (60 mg total) by mouth 2 (two) times daily.   60 tablet   0   . FLUoxetine (PROZAC) 20 MG capsule   Oral   Take 60 mg by mouth every morning.          . gabapentin (NEURONTIN) 100 MG capsule   Oral   Take 2 capsules (200 mg total) by mouth 3 (three) times daily.   240 capsule   2   . hydrochlorothiazide (HYDRODIURIL) 25 MG tablet   Oral   Take 25 mg by mouth every morning.         Marland Kitchen HYDROcodone-acetaminophen (NORCO/VICODIN) 5-325 MG per tablet   Oral   Take 1 tablet by mouth every 6 (six) hours as needed.          Marland Kitchen lisinopril (PRINIVIL,ZESTRIL) 10 MG tablet   Oral   Take 10 mg by mouth daily.          . meclizine  (ANTIVERT) 25 MG tablet   Oral   Take 25 mg by mouth 3 (three) times daily as needed for dizziness.          . meloxicam (MOBIC) 15 MG tablet   Oral   Take 15 mg by mouth daily as needed for pain.          Marland Kitchen oxyCODONE-acetaminophen (PERCOCET/ROXICET) 5-325 MG per tablet   Oral   Take 1 tablet by mouth every 8 (eight) hours as needed.          Marland Kitchen PRESCRIPTION MEDICATION   Topical   Apply 1 application topically daily. Silver sulfadiazine / triamcinolone cream= [facility] For psoriasis         . tacrolimus (PROTOPIC) 0.1 % ointment   Topical   Apply 1 application topically 3 (three) times daily as needed (rash).          . traMADol (ULTRAM) 50 MG tablet   Oral   Take 1 tablet (50 mg total) by mouth every 8 (eight) hours as needed.   60 tablet   0   . warfarin (COUMADIN) 5 MG tablet   Oral   Take 5 mg by mouth every morning.           BP 169/78  Pulse 66  Temp(Src) 98 F (36.7 C) (Oral)  Resp 20  SpO2 99%  LMP 11/22/1996 Physical Exam Gen: elderlyAA female, well appearing, NAD, pleasant and conversant Extremities: no edema or joint tenderness Skin: warm, dry, no rashes, numerous tender LLE varicosities  Neuro/Psych: A&Ox4, normal affect, speech, and thought content   ED Course  Procedures (including critical care time) Labs Review Labs Reviewed - No data to display Imaging Review No results found.    MDM   Final diagnoses:  Chronic pain syndrome    No acute injury. Refilled tramadol and gabapentin. F/u with new PCP.     Garnetta Buddy, MD 11/11/13 2045

## 2013-11-11 NOTE — ED Notes (Signed)
[facility]ed [facility] [address] at patient's request

## 2013-11-11 NOTE — Discharge Instructions (Signed)
[name] [name],   It was nice to meet you. I hope the refills help with your pain. I hope that you get into Family Medicine soon.   Take Care,   Dr. Clinton SawyerWilliamson

## 2013-11-12 NOTE — ED Provider Notes (Signed)
Medical screening examination/treatment/procedure(s) were performed by resident physician or non-physician practitioner and as supervising physician I was immediately available for consultation/collaboration.   KINDL,JAMES DOUGLAS MD.   James D Kindl, MD 11/12/13 2109 

## 2013-11-18 ENCOUNTER — Other Ambulatory Visit: Payer: PRIVATE HEALTH INSURANCE

## 2013-11-18 ENCOUNTER — Ambulatory Visit: Payer: PRIVATE HEALTH INSURANCE

## 2013-11-22 ENCOUNTER — Other Ambulatory Visit: Payer: Self-pay | Admitting: Oncology

## 2013-11-22 ENCOUNTER — Encounter: Payer: Self-pay | Admitting: Oncology

## 2013-11-22 DIAGNOSIS — R911 Solitary pulmonary nodule: Secondary | ICD-10-CM

## 2013-11-22 NOTE — Progress Notes (Signed)
73 year old woman with history of pulmonary embolism followed here for assistance with anticoagulation. I received a [facility]. As part of the evaluation a chest photograph was done and a dominant nodule was noted. Disposition states this can be followed with a CT scan. Also noted is that the patient does not have a primary care physician designated. I will go ahead and order a CT scan of the chest at this time for further evaluation and make the appropriate referral if necessary based on results.

## 2013-11-24 ENCOUNTER — Other Ambulatory Visit: Payer: Self-pay | Admitting: *Deleted

## 2013-11-24 DIAGNOSIS — R911 Solitary pulmonary nodule: Secondary | ICD-10-CM

## 2013-11-24 DIAGNOSIS — I2699 Other pulmonary embolism without acute cor pulmonale: Secondary | ICD-10-CM

## 2013-11-24 DIAGNOSIS — Z7901 Long term (current) use of anticoagulants: Secondary | ICD-10-CM

## 2013-11-25 ENCOUNTER — Other Ambulatory Visit: Payer: PRIVATE HEALTH INSURANCE

## 2013-11-25 ENCOUNTER — Telehealth: Payer: Self-pay | Admitting: Pharmacist

## 2013-11-25 ENCOUNTER — Ambulatory Visit: Payer: PRIVATE HEALTH INSURANCE

## 2013-11-25 ENCOUNTER — Ambulatory Visit (HOSPITAL_COMMUNITY): Admission: RE | Admit: 2013-11-25 | Payer: PRIVATE HEALTH INSURANCE | Source: Ambulatory Visit

## 2013-11-25 NOTE — Telephone Encounter (Signed)
[facility] to res[facility] CT scan.

## 2013-11-25 NOTE — Telephone Encounter (Signed)
Ms Laural BenesJohnson cannot come to coumadin clinic or CT today b/c son had seizure.  Would like us to call tomorrow to reschedule coumadin clinic appt.  I will let Dr. Cyndie ChimeGranfortuna RN know that CT needs rescheduled.

## 2013-11-26 ENCOUNTER — Telehealth: Payer: Self-pay | Admitting: Pharmacist

## 2013-11-26 ENCOUNTER — Telehealth: Payer: Self-pay | Admitting: Oncology

## 2013-11-26 ENCOUNTER — Emergency Department (HOSPITAL_COMMUNITY)
Admission: EM | Admit: 2013-11-26 | Discharge: 2013-11-26 | Disposition: A | Discharge status: Home / Self Care | Payer: PRIVATE HEALTH INSURANCE | Attending: Emergency Medicine | Admitting: Emergency Medicine

## 2013-11-26 ENCOUNTER — Encounter (HOSPITAL_COMMUNITY): Payer: Self-pay | Admitting: Emergency Medicine

## 2013-11-26 ENCOUNTER — Emergency Department (HOSPITAL_COMMUNITY): Payer: PRIVATE HEALTH INSURANCE

## 2013-11-26 ENCOUNTER — Ambulatory Visit (HOSPITAL_COMMUNITY): Payer: PRIVATE HEALTH INSURANCE

## 2013-11-26 DIAGNOSIS — H409 Unspecified glaucoma: Secondary | ICD-10-CM | POA: Insufficient documentation

## 2013-11-26 DIAGNOSIS — M129 Arthropathy, unspecified: Secondary | ICD-10-CM | POA: Insufficient documentation

## 2013-11-26 DIAGNOSIS — G8929 Other chronic pain: Secondary | ICD-10-CM | POA: Insufficient documentation

## 2013-11-26 DIAGNOSIS — Z86718 Personal history of other venous thrombosis and embolism: Secondary | ICD-10-CM | POA: Insufficient documentation

## 2013-11-26 DIAGNOSIS — I10 Essential (primary) hypertension: Secondary | ICD-10-CM | POA: Insufficient documentation

## 2013-11-26 DIAGNOSIS — Z7901 Long term (current) use of anticoagulants: Secondary | ICD-10-CM | POA: Insufficient documentation

## 2013-11-26 DIAGNOSIS — F411 Generalized anxiety disorder: Secondary | ICD-10-CM | POA: Insufficient documentation

## 2013-11-26 DIAGNOSIS — E785 Hyperlipidemia, unspecified: Secondary | ICD-10-CM | POA: Insufficient documentation

## 2013-11-26 DIAGNOSIS — R011 Cardiac murmur, unspecified: Secondary | ICD-10-CM | POA: Insufficient documentation

## 2013-11-26 DIAGNOSIS — Z79899 Other long term (current) drug therapy: Secondary | ICD-10-CM | POA: Insufficient documentation

## 2013-11-26 DIAGNOSIS — Z86711 Personal history of pulmonary embolism: Secondary | ICD-10-CM | POA: Insufficient documentation

## 2013-11-26 DIAGNOSIS — F209 Schizophrenia, unspecified: Secondary | ICD-10-CM | POA: Insufficient documentation

## 2013-11-26 DIAGNOSIS — G4730 Sleep apnea, unspecified: Secondary | ICD-10-CM | POA: Insufficient documentation

## 2013-11-26 DIAGNOSIS — J45909 Unspecified asthma, uncomplicated: Secondary | ICD-10-CM | POA: Insufficient documentation

## 2013-11-26 DIAGNOSIS — F3289 Other specified depressive episodes: Secondary | ICD-10-CM | POA: Insufficient documentation

## 2013-11-26 DIAGNOSIS — Z8701 Personal history of pneumonia (recurrent): Secondary | ICD-10-CM | POA: Insufficient documentation

## 2013-11-26 DIAGNOSIS — D509 Iron deficiency anemia, unspecified: Secondary | ICD-10-CM | POA: Insufficient documentation

## 2013-11-26 DIAGNOSIS — Z872 Personal history of diseases of the skin and subcutaneous tissue: Secondary | ICD-10-CM | POA: Insufficient documentation

## 2013-11-26 DIAGNOSIS — K219 Gastro-esophageal reflux disease without esophagitis: Secondary | ICD-10-CM | POA: Insufficient documentation

## 2013-11-26 DIAGNOSIS — F329 Major depressive disorder, single episode, unspecified: Secondary | ICD-10-CM | POA: Insufficient documentation

## 2013-11-26 LAB — CBC
HCT: 34.8 % — ABNORMAL LOW (ref 36.0–46.0)
Hemoglobin: 11.7 g/dL — ABNORMAL LOW (ref 12.0–15.0)
MCH: 31.8 pg (ref 26.0–34.0)
MCHC: 33.6 g/dL (ref 30.0–36.0)
MCV: 94.6 fL (ref 78.0–100.0)
PLATELETS: 228 10*3/uL (ref 150–400)
RBC: 3.68 MIL/uL — ABNORMAL LOW (ref 3.87–5.11)
RDW: 14.6 % (ref 11.5–15.5)
WBC: 7 10*3/uL (ref 4.0–10.5)

## 2013-11-26 LAB — BASIC METABOLIC PANEL
BUN: 15 mg/dL (ref 6–23)
CO2: 29 mEq/L (ref 19–32)
Calcium: 9.4 mg/dL (ref 8.4–10.5)
Chloride: 100 mEq/L (ref 96–112)
Creatinine, Ser: 0.85 mg/dL (ref 0.50–1.10)
GFR, EST AFRICAN AMERICAN: 79 mL/min — AB (ref >90)
GFR, EST NON AFRICAN AMERICAN: 68 mL/min — AB (ref >90)
Glucose, Bld: 122 mg/dL — ABNORMAL HIGH (ref 70–99)
POTASSIUM: 3.9 meq/L (ref 3.7–5.3)
SODIUM: 139 meq/L (ref 137–147)

## 2013-11-26 LAB — I-STAT TROPONIN, ED: TROPONIN I, POC: 0 ng/mL (ref 0.00–0.08)

## 2013-11-26 LAB — PROTIME-INR
INR: 2.14 — ABNORMAL HIGH (ref 0.00–1.49)
PROTHROMBIN TIME: 23.2 s — AB (ref 11.6–15.2)

## 2013-11-26 MED ORDER — ESOMEPRAZOLE MAGNESIUM 40 MG PO CPDR: 40.0000 mg | DELAYED_RELEASE_CAPSULE | Freq: Every day | ORAL | Status: DC

## 2013-11-26 MED ORDER — SUCRALFATE 1 G PO TABS: 1.0000 g | ORAL_TABLET | Freq: Three times a day (TID) | ORAL | Status: DC

## 2013-11-26 MED ORDER — GI COCKTAIL ~~LOC~~
30.0000 mL | Freq: Once | ORAL | Status: AC
  Administered 2013-11-26: 30 mL via ORAL
  Filled 2013-11-26: qty 30

## 2013-11-26 MED ORDER — PANTOPRAZOLE SODIUM 40 MG PO TBEC
40.0000 mg | DELAYED_RELEASE_TABLET | Freq: Once | ORAL | Status: AC
  Administered 2013-11-26: 40 mg via ORAL
  Filled 2013-11-26: qty 1

## 2013-11-26 NOTE — ED Notes (Addendum)
?   Acid reflux.  Burning sensation up throat.  Ate some spicy foods last week, which triggered it.  Tried nexium and zantac but not helpful. Rt. Lateral ant. Cp and pain in left arm.

## 2013-11-26 NOTE — Telephone Encounter (Signed)
Talked to pt and she is aware of alab before CT appt on 3/10

## 2013-11-26 NOTE — ED Provider Notes (Signed)
TIME SEEN: 10:00 PM  CHIEF COMPLAINT: chest pain  HPI: Patient is a 73 year old female with a history of hypertension, hyperlipidemia, DVT on Coumadin, schizophrenia, chronic pain who is in the emergency department frequently who presents to the emergency department today with 3-4 weeks of constant, burning chest pain that comes from her epigastric region into her chest. She describes the pain as being worse with lying flat and better after antacids. She states it feels like her heartburn. She reports she has been out of her Nexium for the past month and does not have anyone to refill it. She has tried Zantac without relief. She says she has some shortness of breath with this but this appears chronic. She denies to me that she is having any radiation of pain. She denies any vomiting or diarrhea.  No dizziness, diaphoresis. Pain is not exertional or pleuritic. Pain is worse after eating spicy food.  She also reports that she takes NSAIDs heavily but denies bloody stools or melena. Pain is been constant for the past 3-4 weeks, moderate in nature.  ROS: See HPI Constitutional: no fever  Eyes: no drainage  ENT: no runny nose   Cardiovascular:  Burning  chest pain  Resp:  chronic  SOB  GI: no vomiting GU: no dysuria Integumentary: no rash  Allergy: no hives  Musculoskeletal: no leg swelling  Neurological: no slurred speech ROS otherwise negative  PAST MEDICAL HISTORY/PAST SURGICAL HISTORY:  Past Medical History  Diagnosis Date  . Depression   . Schizophrenia   . Hyperlipidemia   . Hypertension   . Chronic pain     "over my whole body" (03/30/2013)  . 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 DVT (deep venous thrombosis)     per 01/17/10 d/c summary- "remote hx of dvt"?  . Iron deficiency anemia   . Glaucoma   . Hemorrhoids   . Asthma   . Anxiety   . GERD (gastroesophageal reflux disease)   . Psoriasis 04/29/2012    New onset evaluated by Dr Marylou Flesher,  Dermatology, [facility] 6/13  Rx 0.1% Tacrolimus ointment  . Complication of anesthesia     "because I have sleep apnea" (03/30/2013)  . Heart murmur   . Chest pain, exertional   . Chronic bronchitis   . Sleep apnea     "waiting on my CPAP" (03/30/2013)  . Pneumonia     "a few times" (03/30/2013)  . Exertional shortness of breath     "& sometimes when laying down" (03/30/2013)  . Daily headache     "last 2 months" (03/30/2013)  . Arthritis     "joints" (03/30/2013)  . Varicose veins of legs   . Psoriasis     MEDICATIONS:  Prior to Admission medications   Medication Sig Start Date End Date Taking? Authorizing Provider  albuterol (PROVENTIL HFA;VENTOLIN HFA) 108 (90 BASE) MCG/ACT inhaler Inhale 2 puffs into the lungs every 4 (four) hours as needed for wheezing or shortness of breath. 10/21/13  Yes Hayden Rasmussen, NP  albuterol (VENTOLIN HFA) 108 (90 BASE) MCG/ACT inhaler Inhale 2 puffs into the lungs 2 (two) times daily as needed for wheezing or shortness of breath.    Yes Historical Provider, MD  atenolol (TENORMIN) 100 MG tablet Take 100 mg by mouth daily.  09/14/13  Yes Historical Provider, MD  cyclobenzaprine (FLEXERIL) 10 MG tablet Take 10 mg by mouth 2 (two) times daily as needed for muscle spasms.   Yes Historical  Provider, MD  FLUoxetine (PROZAC) 20 MG capsule Take 60 mg by mouth every morning.    Yes Historical Provider, MD  gabapentin (NEURONTIN) 100 MG capsule Take 200 mg by mouth 3 (three) times daily. 11/11/13  Yes Garnetta Buddy, MD  hydrochlorothiazide (HYDRODIURIL) 25 MG tablet Take 25 mg by mouth every morning.   Yes Historical Provider, MD  hydrOXYzine (ATARAX/VISTARIL) 25 MG tablet Take 25 mg by mouth 3 (three) times daily.   Yes Historical Provider, MD  lisinopril (PRINIVIL,ZESTRIL) 10 MG tablet Take 10 mg by mouth daily.  09/12/13  Yes Historical Provider, MD  meclizine (ANTIVERT) 25 MG tablet Take 25 mg by mouth 3 (three) times daily as needed for dizziness.    Yes Historical  Provider, MD  meloxicam (MOBIC) 15 MG tablet Take 15 mg by mouth daily as needed for pain.    Yes Historical Provider, MD  PRESCRIPTION MEDICATION Apply 1 application topically daily. Silver sulfadiazine / triamcinolone cream= [facility] For psoriasis   Yes Historical Provider, MD  tacrolimus (PROTOPIC) 0.1 % ointment Apply 1 application topically 3 (three) times daily as needed (rash).  01/15/13  Yes Historical Provider, MD  traMADol (ULTRAM) 50 MG tablet Take 50 mg by mouth every 8 (eight) hours as needed for moderate pain. 11/11/13  Yes Garnetta Buddy, MD  warfarin (COUMADIN) 5 MG tablet Take 5 mg by mouth every morning.    Yes Historical Provider, MD    ALLERGIES:  Allergies  Allergen Reactions  . Tramadol Other (See Comments)    Makes me hurt more-acid reflux  . Vicodin [Hydrocodone-Acetaminophen] Other (See Comments)    Makes me hurt-acid reflux    SOCIAL HISTORY:  History  Substance Use Topics  . Smoking status: Never Smoker   . Smokeless tobacco: Never Used  . Alcohol Use: No    FAMILY HISTORY: Family History  Problem Relation Age of Onset  . Diabetes Sister   . Colon cancer Brother 40    EXAM: BP 143/89  Pulse 68  Temp(Src) 98.5 F (36.9 C) (Oral)  Resp 16  SpO2 99%  LMP 11/22/1996 CONSTITUTIONAL: Alert and oriented and responds appropriately to questions. Well-appearing; well-nourished HEAD: Normocephalic EYES: Conjunctivae clear, PERRL ENT: normal nose; no rhinorrhea; moist mucous membranes; pharynx without lesions noted NECK: Supple, no meningismus, no LAD  CARD: RRR; S1 and S2 appreciated; no murmurs, no clicks, no rubs, no gallops RESP: Normal chest excursion without splinting or tachypnea; breath sounds clear and equal bilaterally; no wheezes, no rhonchi, no rales,  ABD/GI: Normal bowel sounds; non-distended; soft, non-tender, no rebound, no guarding BACK:  The back appears normal and is non-tender to palpation, there is no CVA  tenderness EXT: Normal ROM in all joints; non-tender to palpation; no edema; normal capillary refill; no cyanosis    SKIN: Normal color for age and race; warm NEURO: Moves all extremities equally PSYCH: The patient's mood and manner are appropriate. Grooming and personal hygiene are appropriate.  MEDICAL DECISION MAKING: Patient here with likely acid reflux. Doubt this is ACS, pulmonary embolus given the atypical nature of her symptoms. EKG shows no new ischemic changes. Troponin is negative. Given patient has had symptoms for 4 weeks without relief, I do not feel she needs a second set of cardiac enzymes. Her abdominal exam is benign. Her chest x-ray is clear. Patient has a history of DVT but is on Coumadin and I do not feel her symptoms are consistent with that of a pulmonary embolus. We'll give GI  cocktail and PPI and reassess.  ED PROGRESS: Patient's INR is therapeutic. She has no pain after GI cocktail. We'll discharge him with a refill of her Nexium and start her on Carafate as needed. Given strict return precautions. Patient verbalizes understanding and is comfortable plan.   EKG Interpretation  Date/Time:  Thursday November 26 2013 21:01:07 EST Ventricular Rate:  68 PR Interval:  178 QRS Duration: 80 QT Interval:  390 QTC Calculation: 414 R Axis:   31 Text Interpretation:  Normal sinus rhythm Minimal voltage criteria for LVH, may be normal variant Borderline ECG No significant change since last tracing Confirmed by Paiton Fosco,  DO, Bellamy Rubey [phone]([id number]) on 11/26/2013 10:06:47 PM         Layla MawKristen N Matei Magnone, DO 11/26/13 2251

## 2013-11-26 NOTE — Discharge Instructions (Signed)
Diet for Gastroesophageal Reflux Disease, Adult Reflux (acid reflux) is when acid from your stomach flows up into the esophagus. When acid comes in contact with the esophagus, the acid causes irritation and soreness (inflammation) in the esophagus. When reflux happens often or so severely that it causes damage to the esophagus, it is called gastroesophageal reflux disease (GERD). Nutrition therapy can help ease the discomfort of GERD. FOODS OR DRINKS TO AVOID OR LIMIT  Smoking or chewing tobacco. Nicotine is one of the most potent stimulants to acid production in the gastrointestinal tract.  Caffeinated and decaffeinated coffee and black tea.  Regular or low-calorie carbonated beverages or energy drinks (caffeine-free carbonated beverages are allowed).   Strong spices, such as black pepper, white pepper, red pepper, cayenne, curry powder, and chili powder.  Peppermint or spearmint.  Chocolate.  High-fat foods, including meats and fried foods. Extra added fats including oils, butter, salad dressings, and nuts. Limit these to less than 8 tsp per day.  Fruits and vegetables if they are not tolerated, such as citrus fruits or tomatoes.  Alcohol.  Any food that seems to aggravate your condition. If you have questions regarding your diet, call your caregiver or a registered dietitian. OTHER THINGS THAT MAY HELP GERD INCLUDE:   Eating your meals slowly, in a relaxed setting.  Eating 5 to 6 small meals per day instead of 3 large meals.  Eliminating food for a period of time if it causes distress.  Not lying down until 3 hours after eating a meal.  Keeping the head of your bed raised 6 to 9 inches (15 to 23 cm) by using a foam wedge or blocks under the legs of the bed. Lying flat may make symptoms worse.  Being physically active. Weight loss may be helpful in reducing reflux in overweight or obese adults.  Wear loose fitting clothing EXAMPLE MEAL PLAN This meal plan is approximately  2,000 calories based on [employer]My[employer].gov meal planning guidelines. Breakfast   cup cooked oatmeal.  1 cup strawberries.  1 cup low-fat milk.  1 oz almonds. Snack  1 cup cucumber slices.  6 oz yogurt (made from low-fat or fat-free milk). Lunch  2 slice whole-wheat bread.  2 oz sliced turkey.  2 tsp mayonnaise.  1 cup blueberries.  1 cup snap peas. Snack  6 whole-wheat crackers.  1 oz string cheese. Dinner   cup brown rice.  1 cup mixed veggies.  1 tsp olive oil.  3 oz grilled fish. Document Released: 09/10/2005 Document Revised: 12/03/2011 Document Reviewed: 07/27/2011 Exit[employer] Patient Information 2014 [employer]. Gastroesophageal Reflux Disease, Adult Gastroesophageal reflux disease (GERD) happens when acid from your stomach flows up into the esophagus. When acid comes in contact with the esophagus, the acid causes soreness (inflammation) in the esophagus. Over time, GERD may create small holes (ulcers) in the lining of the esophagus. CAUSES   Increased body weight. This puts pressure on the stomach, making acid rise from the stomach into the esophagus.  Smoking. This increases acid production in the stomach.  Drinking alcohol. This causes decreased pressure in the lower esophageal sphincter (valve or ring of muscle between the esophagus and stomach), allowing acid from the stomach into the esophagus.  Late evening meals and a full stomach. This increases pressure and acid production in the stomach.  A malformed lower esophageal sphincter. Sometimes, no cause is found. SYMPTOMS   Burning pain in the lower part of the mid-chest behind the breastbone and in the mid-stomach area. This may   occur twice a week or more often.  Trouble swallowing.  Sore throat.  Dry cough.  Asthma-like symptoms including chest tightness, shortness of breath, or wheezing. DIAGNOSIS  Your caregiver may be able to diagnose GERD based on your symptoms. In some cases,  X-rays and other tests may be done to check for complications or to check the condition of your stomach and esophagus. TREATMENT  Your caregiver may recommend over-the-counter or prescription medicines to help decrease acid production. Ask your caregiver before starting or adding any new medicines.  HOME CARE INSTRUCTIONS   Change the factors that you can control. Ask your caregiver for guidance concerning weight loss, quitting smoking, and alcohol consumption.  Avoid foods and drinks that make your symptoms worse, such as:  Caffeine or alcoholic drinks.  Chocolate.  Peppermint or mint flavorings.  Garlic and onions.  Spicy foods.  Citrus fruits, such as oranges, lemons, or limes.  Tomato-based foods such as sauce, chili, salsa, and pizza.  Fried and fatty foods.  Avoid lying down for the 3 hours prior to your bedtime or prior to taking a nap.  Eat small, frequent meals instead of large meals.  Wear loose-fitting clothing. Do not wear anything tight around your waist that causes pressure on your stomach.  Raise the head of your bed 6 to 8 inches with wood blocks to help you sleep. Extra pillows will not help.  Only take over-the-counter or prescription medicines for pain, discomfort, or fever as directed by your caregiver.  Do not take aspirin, ibuprofen, or other nonsteroidal anti-inflammatory drugs (NSAIDs). SEEK IMMEDIATE MEDICAL CARE IF:   You have pain in your arms, neck, jaw, teeth, or back.  Your pain increases or changes in intensity or duration.  You develop nausea, vomiting, or sweating (diaphoresis).  You develop shortness of breath, or you faint.  Your vomit is green, yellow, black, or looks like coffee grounds or blood.  Your stool is red, bloody, or black. These symptoms could be signs of other problems, such as heart disease, gastric bleeding, or esophageal bleeding. MAKE SURE YOU:   Understand these instructions.  Will watch your  condition.  Will get help right away if you are not doing well or get worse. Document Released: 06/20/2005 Document Revised: 12/03/2011 Document Reviewed: 03/30/2011 [employer] Patient Information 2014 [employer].  

## 2013-11-30 NOTE — Telephone Encounter (Signed)
Spoke with [name] [name]. She would like to come in next Tuesday 3/10, she has a CT that afternoon. She will come for lab at 11:15, Coumadin clinic at 11:30.

## 2013-12-01 ENCOUNTER — Encounter (HOSPITAL_COMMUNITY): Payer: Self-pay

## 2013-12-01 ENCOUNTER — Ambulatory Visit (HOSPITAL_BASED_OUTPATIENT_CLINIC_OR_DEPARTMENT_OTHER): Payer: PRIVATE HEALTH INSURANCE | Admitting: Pharmacist

## 2013-12-01 ENCOUNTER — Other Ambulatory Visit (HOSPITAL_BASED_OUTPATIENT_CLINIC_OR_DEPARTMENT_OTHER): Payer: PRIVATE HEALTH INSURANCE

## 2013-12-01 ENCOUNTER — Ambulatory Visit (HOSPITAL_COMMUNITY)
Admission: RE | Admit: 2013-12-01 | Discharge: 2013-12-01 | Disposition: A | Discharge status: Home / Self Care | Payer: PRIVATE HEALTH INSURANCE | Source: Ambulatory Visit | Attending: Oncology | Admitting: Oncology

## 2013-12-01 DIAGNOSIS — R911 Solitary pulmonary nodule: Secondary | ICD-10-CM

## 2013-12-01 DIAGNOSIS — I2699 Other pulmonary embolism without acute cor pulmonale: Secondary | ICD-10-CM

## 2013-12-01 DIAGNOSIS — D6851 Activated protein C resistance: Secondary | ICD-10-CM

## 2013-12-01 DIAGNOSIS — D6859 Other primary thrombophilia: Secondary | ICD-10-CM

## 2013-12-01 DIAGNOSIS — K8020 Calculus of gallbladder without cholecystitis without obstruction: Secondary | ICD-10-CM | POA: Insufficient documentation

## 2013-12-01 DIAGNOSIS — Z7901 Long term (current) use of anticoagulants: Secondary | ICD-10-CM

## 2013-12-01 LAB — BASIC METABOLIC PANEL (CC13)
Anion Gap: 9 mEq/L (ref 3–11)
BUN: 15.4 mg/dL (ref 7.0–26.0)
CALCIUM: 9.3 mg/dL (ref 8.4–10.4)
CHLORIDE: 102 meq/L (ref 98–109)
CO2: 28 mEq/L (ref 22–29)
CREATININE: 0.9 mg/dL (ref 0.6–1.1)
Glucose: 104 mg/dl (ref 70–140)
Potassium: 3.9 mEq/L (ref 3.5–5.1)
Sodium: 138 mEq/L (ref 136–145)

## 2013-12-01 LAB — CBC WITH DIFFERENTIAL/PLATELET
BASO%: 0.3 % (ref 0.0–2.0)
Basophils Absolute: 0 10*3/uL (ref 0.0–0.1)
EOS%: 3.9 % (ref 0.0–7.0)
Eosinophils Absolute: 0.2 10*3/uL (ref 0.0–0.5)
HEMATOCRIT: 35.1 % (ref 34.8–46.6)
HGB: 11.7 g/dL (ref 11.6–15.9)
LYMPH#: 1.1 10*3/uL (ref 0.9–3.3)
LYMPH%: 19.3 % (ref 14.0–49.7)
MCH: 31.5 pg (ref 25.1–34.0)
MCHC: 33.3 g/dL (ref 31.5–36.0)
MCV: 94.7 fL (ref 79.5–101.0)
MONO#: 0.3 10*3/uL (ref 0.1–0.9)
MONO%: 5.2 % (ref 0.0–14.0)
NEUT#: 4 10*3/uL (ref 1.5–6.5)
NEUT%: 71.3 % (ref 38.4–76.8)
Platelets: 241 10*3/uL (ref 145–400)
RBC: 3.7 10*6/uL (ref 3.70–5.45)
RDW: 14.4 % (ref 11.2–14.5)
WBC: 5.6 10*3/uL (ref 3.9–10.3)

## 2013-12-01 LAB — POCT INR: INR: 2.4

## 2013-12-01 LAB — PROTIME-INR
INR: 2.4 (ref 2.00–3.50)
Protime: 28.8 Seconds — ABNORMAL HIGH (ref 10.6–13.4)

## 2013-12-01 MED ORDER — IOHEXOL 300 MG/ML  SOLN
100.0000 mL | Freq: Once | INTRAMUSCULAR | Status: AC | PRN
  Administered 2013-12-01: 100 mL via INTRAVENOUS

## 2013-12-01 NOTE — Progress Notes (Signed)
INR remains at goal on coumadin 5mg  daily.  No bleeding/ unusual bruising.  Medications reviewed.  Ms [name] [name] is not taking ultram or flexeril at this time.  Will continue coumadin 5mg  daily and check PT/INR in 1 month.  Ms [name] [name] is scheduled for a chest CT today.

## 2013-12-02 ENCOUNTER — Telehealth: Payer: Self-pay | Admitting: *Deleted

## 2013-12-02 NOTE — Telephone Encounter (Signed)
Message copied by Raphael GibneyPLUMMER, Maygan Koeller M on Wed Dec 02, 2013  3:27 PM ------      Message from: Orbie HurstMYERS, JANICE R      Created: Wed Dec 02, 2013  3:23 PM                   ----- Message -----         From: Levert FeinsteinJames M Granfortuna, MD         Sent: 12/01/2013   6:32 PM           To: Orbie HurstJanice R Myers, RN, Sabino SnipesMyrtle Hardin, RN, #            Call pt: CT scan of chest normal  No tumors seen ------

## 2013-12-02 NOTE — Telephone Encounter (Signed)
Called and informed patient tat CT scan of chest is normal and no tumors were seen.  Per Dr. Cyndie ChimeGranfortuna.  Patient verbalized understanding.

## 2013-12-03 ENCOUNTER — Telehealth: Payer: Self-pay | Admitting: *Deleted

## 2013-12-03 NOTE — Telephone Encounter (Signed)
Message copied by Orbie HurstMYERS, Janicia Monterrosa R on Thu Dec 03, 2013  2:51 PM ------      Message from: Levert FeinsteinGRANFORTUNA, JAMES M      Created: Tue Dec 01, 2013  5:54 PM       Call pt: lab all good ------

## 2013-12-03 NOTE — Telephone Encounter (Signed)
Spoke with patient.  Let her know that lab is all good.  She knows to be here on Tuesday 3/17 to see Lonna CobbLisa Thomas NP.

## 2013-12-04 ENCOUNTER — Telehealth: Payer: Self-pay | Admitting: *Deleted

## 2013-12-04 ENCOUNTER — Ambulatory Visit: Payer: PRIVATE HEALTH INSURANCE | Admitting: Family Medicine

## 2013-12-04 NOTE — Telephone Encounter (Signed)
Message copied by Sabino SnipesHARDIN, Lanyah Spengler on Fri Dec 04, 2013  2:55 PM ------      Message from: Levert FeinsteinGRANFORTUNA, JAMES M      Created: Tue Dec 01, 2013  6:32 PM       Call pt: CT scan of chest normal  No tumors seen ------

## 2013-12-04 NOTE — Telephone Encounter (Signed)
Notified pt of good CT result per Dr Patsy LagerGranfortuna's instructions & reminded pt of appt next week with Lonna CobbLisa Thomas NP.

## 2013-12-08 ENCOUNTER — Other Ambulatory Visit: Payer: PRIVATE HEALTH INSURANCE

## 2013-12-08 ENCOUNTER — Encounter: Payer: PRIVATE HEALTH INSURANCE | Admitting: Nurse Practitioner

## 2013-12-10 ENCOUNTER — Emergency Department (INDEPENDENT_AMBULATORY_CARE_PROVIDER_SITE_OTHER)
Admission: EM | Admit: 2013-12-10 | Discharge: 2013-12-10 | Disposition: A | Discharge status: Home / Self Care | Payer: PRIVATE HEALTH INSURANCE | Source: Home / Self Care | Attending: Emergency Medicine | Admitting: Emergency Medicine

## 2013-12-10 ENCOUNTER — Ambulatory Visit (INDEPENDENT_AMBULATORY_CARE_PROVIDER_SITE_OTHER): Payer: PRIVATE HEALTH INSURANCE | Admitting: Family Medicine

## 2013-12-10 ENCOUNTER — Encounter (HOSPITAL_COMMUNITY): Payer: Self-pay | Admitting: Emergency Medicine

## 2013-12-10 VITALS — BP 150/59 | HR 69 | Temp 99.1°F | Ht 65.0 in | Wt 248.0 lb

## 2013-12-10 DIAGNOSIS — Z79899 Other long term (current) drug therapy: Secondary | ICD-10-CM | POA: Diagnosis not present

## 2013-12-10 DIAGNOSIS — G629 Polyneuropathy, unspecified: Secondary | ICD-10-CM

## 2013-12-10 DIAGNOSIS — Z13820 Encounter for screening for osteoporosis: Secondary | ICD-10-CM | POA: Diagnosis not present

## 2013-12-10 DIAGNOSIS — G589 Mononeuropathy, unspecified: Secondary | ICD-10-CM | POA: Diagnosis not present

## 2013-12-10 DIAGNOSIS — E785 Hyperlipidemia, unspecified: Secondary | ICD-10-CM

## 2013-12-10 DIAGNOSIS — E2839 Other primary ovarian failure: Secondary | ICD-10-CM

## 2013-12-10 DIAGNOSIS — Z1239 Encounter for other screening for malignant neoplasm of breast: Secondary | ICD-10-CM

## 2013-12-10 DIAGNOSIS — M79606 Pain in leg, unspecified: Secondary | ICD-10-CM

## 2013-12-10 DIAGNOSIS — M549 Dorsalgia, unspecified: Secondary | ICD-10-CM

## 2013-12-10 DIAGNOSIS — I10 Essential (primary) hypertension: Secondary | ICD-10-CM | POA: Diagnosis not present

## 2013-12-10 DIAGNOSIS — E669 Obesity, unspecified: Secondary | ICD-10-CM

## 2013-12-10 DIAGNOSIS — M79609 Pain in unspecified limb: Secondary | ICD-10-CM

## 2013-12-10 LAB — LIPID PANEL
CHOLESTEROL: 301 mg/dL — AB (ref 0–200)
HDL: 69 mg/dL (ref >39)
LDL Cholesterol: 208 mg/dL — ABNORMAL HIGH (ref 0–99)
Total CHOL/HDL Ratio: 4.4 Ratio
Triglycerides: 120 mg/dL (ref <150)
VLDL: 24 mg/dL (ref 0–40)

## 2013-12-10 LAB — POCT GLYCOSYLATED HEMOGLOBIN (HGB A1C): HEMOGLOBIN A1C: 6.1

## 2013-12-10 MED ORDER — TRAMADOL HCL 50 MG PO TABS: 100.0000 mg | ORAL_TABLET | Freq: Three times a day (TID) | ORAL | Status: DC | PRN

## 2013-12-10 NOTE — Discharge Instructions (Signed)
Back Pain, Adult Low back pain is very common. About 1 in 5 people have back pain.The cause of low back pain is rarely dangerous. The pain often gets better over time.About half of people with a sudden onset of back pain feel better in just 2 weeks. About 8 in 10 people feel better by 6 weeks.  CAUSES Some common causes of back pain include:  Strain of the muscles or ligaments supporting the spine.  Wear and tear (degeneration) of the spinal discs.  Arthritis.  Direct injury to the back. DIAGNOSIS Most of the time, the direct cause of low back pain is not known.However, back pain can be treated effectively even when the exact cause of the pain is unknown.Answering your caregiver's questions about your overall health and symptoms is one of the most accurate ways to make sure the cause of your pain is not dangerous. If your caregiver needs more information, he or she may order lab work or imaging tests (X-rays or MRIs).However, even if imaging tests show changes in your back, this usually does not require surgery. HOME CARE INSTRUCTIONS For many people, back pain returns.Since low back pain is rarely dangerous, it is often a condition that people can learn to manageon their own.   Remain active. It is stressful on the back to sit or stand in one place. Do not sit, drive, or stand in one place for more than 30 minutes at a time. Take short walks on level surfaces as soon as pain allows.Try to increase the length of time you walk each day.  Do not stay in bed.Resting more than 1 or 2 days can delay your recovery.  Do not avoid exercise or work.Your body is made to move.It is not dangerous to be active, even though your back may hurt.Your back will likely heal faster if you return to being active before your pain is gone.  Pay attention to your body when you bend and lift. Many people have less discomfortwhen lifting if they bend their knees, keep the load close to their bodies,and  avoid twisting. Often, the most comfortable positions are those that put less stress on your recovering back.  Find a comfortable position to sleep. Use a firm mattress and lie on your side with your knees slightly bent. If you lie on your back, put a pillow under your knees.  Only take over-the-counter or prescription medicines as directed by your caregiver. Over-the-counter medicines to reduce pain and inflammation are often the most helpful.Your caregiver may prescribe muscle relaxant drugs.These medicines help dull your pain so you can more quickly return to your normal activities and healthy exercise.  Put ice on the injured area.  Put ice in a plastic bag.  Place a towel between your skin and the bag.  Leave the ice on for 15-20 minutes, 03-04 times a day for the first 2 to 3 days. After that, ice and heat may be alternated to reduce pain and spasms.  Ask your caregiver about trying back exercises and gentle massage. This may be of some benefit.  Avoid feeling anxious or stressed.Stress increases muscle tension and can worsen back pain.It is important to recognize when you are anxious or stressed and learn ways to manage it.Exercise is a great option. SEEK MEDICAL CARE IF:  You have pain that is not relieved with rest or medicine.  You have pain that does not improve in 1 week.  You have new symptoms.  You are generally not feeling well. SEEK   IMMEDIATE MEDICAL CARE IF:   You have pain that radiates from your back into your legs.  You develop new bowel or bladder control problems.  You have unusual weakness or numbness in your arms or legs.  You develop nausea or vomiting.  You develop abdominal pain.  You feel faint. Document Released: 09/10/2005 Document Revised: 03/11/2012 Document Reviewed: 01/29/2011 Exit[employer] Patient Information 2014 [employer].  

## 2013-12-10 NOTE — ED Provider Notes (Signed)
Chief Complaint    Chief Complaint  Patient presents with  . Medication Refill    History of Present Illness     [name] SnowballDora C Rainville is a 73 year old female with schizophrenia, depression, and multiple medical issues including chronic pain. She is in today to get a refill on pain medicine. She wanted tramadol and oxycodone. She states that she had been seeing another primary care physician, they had a falling out, so she switched to the family medicine clinic. She had her first visit there today, but was not provided with any pain medication, so she came right over here to get pain medicine refill. She describes all over pain including her ankles, legs, back, arms, and hands. There is some swelling of her ankles.   Review of Systems     Other than as noted above, the patient denies any of the following symptoms: Systemic:  No fevers, chills, sweats, or muscle aches.  No weight loss.  Musculoskeletal:  No joint pain, arthritis, bursitis, swelling, back pain, or neck pain. Neurological:  No muscular weakness, paresthesias, headache, or trouble with speech or coordination.  No dizziness.  PMFSH    Past medical history, family history, social history, meds, and allergies were reviewed.  She has multiple medical problems including depression, schizophrenia, hyperlipidemia, hypertension, chronic total body pain, pulmonary embolism, DVT, iron deficiency anemia, glaucoma, hemorrhoids, psoriasis, asthma, anxiety, gastroesophageal reflux, heart murmur, sleep apnea, chronic daily headaches, arthritis, and varicose veins. She is on numerous medications including albuterol, atenolol, Flexeril, Nexium, Prozac, Neurontin, hydrochlorothiazide, hydroxyzine, lisinopril, meclizine, meloxicam, Protopic, Carafate, and warfarin.  Physical Exam    Vital signs:  BP 145/83  Pulse 61  Temp(Src) 98.4 F (36.9 C) (Oral)  Resp 16  SpO2 100%  LMP 11/22/1996 Gen:  Alert and oriented times 3.  In no  distress. Musculoskeletal: She is tender to touch everywhere. Joints have full range of motion and there is no deformity or swelling.  Otherwise, all joints had a full a ROM with no swelling, bruising or deformity.  She has nonpitting ankle edema, pulses full. Extremities were warm and pink.  Capillary refill was brisk.  Skin:  Clear, warm and dry.  No rash. Neuro:  Alert and oriented times 3.  Muscle strength was normal.  Sensation was intact to light touch.   Assessment    The encounter diagnosis was Back pain.  I explained to her that we did not refill OxyContin or oxycodone here for chronic pain. I did agree to give her a small prescription of tramadol to tie her over until she can get back to see her family physician.  Plan   1.  Meds:  The following meds were prescribed:   Discharge Medication List as of 12/10/2013  4:50 PM    START taking these medications   Details  !! traMADol (ULTRAM) 50 MG tablet Take 2 tablets (100 mg total) by mouth every 8 (eight) hours as needed., Starting 12/10/2013, Until Discontinued, Normal     !! - Potential duplicate medications found. Please discuss with provider.      2.  Patient Education/Counseling:  The patient was given appropriate handouts, self care instructions, and instructed in symptomatic relief, including rest and activity, elevation, application of ice and compression.    3.  Follow up:  The patient was told to follow up here if no better in 3 to 4 days, or sooner if becoming worse in any way, and given some red flag symptoms such as worsening pain or  new neurological symptoms which would prompt immediate return.  Follow up here as needed.     Reuben Likes, MD 12/10/13 2239

## 2013-12-10 NOTE — ED Notes (Signed)
C/o medication refill States she was seen at family practice for first visit but was not allowed to get refills on meds States next appt is in April States she needs acid reflux and pain med

## 2013-12-10 NOTE — Patient Instructions (Signed)
Thank you for coming in today!  We are checking some labs today, and I will call you if they are abnormal. If you do not hear from me by phone or letter in 2 weeks, please call us as I may have been unable to reach you.   Make sure you get your mammogram and bone scan.   As you leave, make an appointment to follow up with me in 1 month to discuss your pain.  - Bring all medications in a bag to your visits.  Take care and seek immediate care sooner if you develop any concerns.  Please feel free to call with any questions or concerns at any time, at [phone]. - Dr. Jarvis NewcomerGrunz

## 2013-12-11 LAB — VITAMIN B12: Vitamin B-12: 507 pg/mL (ref 211–911)

## 2013-12-14 ENCOUNTER — Encounter: Payer: Self-pay | Admitting: Family Medicine

## 2013-12-14 NOTE — Progress Notes (Signed)
Patient ID: [name] [name], female   DOB: 10/28/1940, 73 y.o.   MRN: [id number]   Subjective:  HPI:   [name] SnowballDora C [name] is a 73 y.o. female with a history of HTN, hyperlipidemia, obesity, GERD, asthma, PE on coumadin, and substance abuse here to establish care.  [name] [name] reports 10/10 "aching" pain throughout her body and is not able to specify specific areas of pain. Moving around makes this pain worse. She usually takes tramadol for the pain and still has some remaining from her previous refill. Nothing else helps the pain. She has had this for many years.   She also has sharp pain in her feet radiating to her toes without aggravating factors that is intermittent and severe for the past many years.   Review of Systems:  Per HPI. All other systems reviewed and are negative.    Past Medical History: Patient Active Problem List   Diagnosis Date Noted  . Back pain 06/09/2013  . Cellulitis of leg, left 03/30/2013  . Cellulitis of leg, right 03/30/2013  . Crack cocaine use 10/03/2012  . Vertigo 05/31/2012  . ARF (acute renal failure) 05/31/2012  . Orthostatic hypotension 05/31/2012  . Psoriasis 04/29/2012  . Bradycardia 12/12/2011  . Rectal bleeding 12/11/2011  . Chronic anticoagulation 12/11/2011  . Pre-syncope 12/11/2011  . Acute post-hemorrhagic anemia 10/07/2011  . Hypotension 10/07/2011  . Heterozygous factor V Leiden mutation 01/18/2011  . Anemia, normocytic normochromic 01/18/2011  . GERD 12/21/2010  . Family history of malignant neoplasm of gastrointestinal tract 12/21/2010  . Iron deficiency anemia, unspecified 12/21/2010  . Varicose veins of both lower extremities with pain 12/15/2010  . Encounter for long-term (current) use of anticoagulants 11/30/2010  . Counseling on health promotion and disease prevention 11/12/2010  . Lung nodule 11/12/2010  . CPK, ABNORMAL 06/18/2010  . PULMONARY EMBOLISM 01/30/2010  . HYPERLIPIDEMIA 01/10/2010  . 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  OBESITY, UNSPECIFIED 09/26/2009  . SCHIZOPHRENIA 09/26/2009  . DEPRESSION 09/26/2009  . Other chronic pain 09/26/2009  . Essential hypertension, benign 09/26/2009    Medications: reviewed and updated Current Outpatient Prescriptions  Medication Sig Dispense Refill  . albuterol (PROVENTIL HFA;VENTOLIN HFA) 108 (90 BASE) MCG/ACT inhaler Inhale 2 puffs into the lungs every 4 (four) hours as needed for wheezing or shortness of breath.  1 Inhaler  0  . albuterol (VENTOLIN HFA) 108 (90 BASE) MCG/ACT inhaler Inhale 2 puffs into the lungs 2 (two) times daily as needed for wheezing or shortness of breath.       Marland Kitchen. atenolol (TENORMIN) 100 MG tablet Take 100 mg by mouth daily.       . cyclobenzaprine (FLEXERIL) 10 MG tablet Take 10 mg by mouth 2 (two) times daily as needed for muscle spasms.      Marland Kitchen. esomeprazole (NEXIUM) 40 MG capsule Take 1 capsule (40 mg total) by mouth daily.  30 capsule  1  . FLUoxetine (PROZAC) 20 MG capsule Take 60 mg by mouth every morning.       . gabapentin (NEURONTIN) 100 MG capsule Take 200 mg by mouth 3 (three) times daily.      . hydrochlorothiazide (HYDRODIURIL) 25 MG tablet Take 25 mg by mouth every morning.      . hydrOXYzine (ATARAX/VISTARIL) 25 MG tablet Take 25 mg by mouth 3 (three) times daily.      Marland Kitchen. lisinopril (PRINIVIL,ZESTRIL) 10 MG tablet Take 10 mg by mouth daily.       . meclizine (ANTIVERT) 25 MG tablet Take  25 mg by mouth 3 (three) times daily as needed for dizziness.       . meloxicam (MOBIC) 15 MG tablet Take 15 mg by mouth daily as needed for pain.       Marland Kitchen PRESCRIPTION MEDICATION Apply 1 application topically daily. Silver sulfadiazine / triamcinolone cream= compounded by [facility] For psoriasis      . sucralfate (CARAFATE) 1 G tablet Take 1 tablet (1 g total) by mouth 4 (four) times daily -  with meals and at bedtime.  120 tablet  0  . tacrolimus (PROTOPIC) 0.1 % ointment Apply 1 application topically 3 (three) times daily as needed (rash).       . traMADol  (ULTRAM) 50 MG tablet Take 50 mg by mouth every 8 (eight) hours as needed for moderate pain.      . traMADol (ULTRAM) 50 MG tablet Take 2 tablets (100 mg total) by mouth every 8 (eight) hours as needed.  30 tablet  0  . warfarin (COUMADIN) 5 MG tablet Take 5 mg by mouth every morning.        No current facility-administered medications for this visit.    Objective:  Physical Exam: BP 150/59  Pulse 69  Temp(Src) 99.1 F (37.3 C) (Oral)  Ht 5\' 5"  (1.651 m)  Wt 248 lb (112.492 kg)  BMI 41.27 kg/m2  LMP 11/22/1996  Gen: Well-appearing obese 73 y.o. female in NAD HEENT: MMM, EOMI, PERRL, anicteric sclerae CV: RRR, no MRG, no JVD Resp: Non-labored, CTAB, no wheezes noted Abd: Soft, NTND, BS present, no guarding or organomegaly MSK: No edema noted, full ROM Neuro: Alert and oriented, speech normal    Assessment:     [name] [name] is a 73 y.o. female here to establish care.     Plan:     See problem list for problem-specific plans. - Mammogram - Bone scan

## 2013-12-15 DIAGNOSIS — M79606 Pain in leg, unspecified: Secondary | ICD-10-CM | POA: Insufficient documentation

## 2013-12-15 NOTE — Assessment & Plan Note (Signed)
No signs of recurrent clot and recent negative U/S. Proceed with neuropathy work up and follow up in 1 month. H/o substance abuse, so will not prescribe opioids at this time.

## 2013-12-16 ENCOUNTER — Other Ambulatory Visit: Payer: Self-pay | Admitting: Family Medicine

## 2013-12-16 ENCOUNTER — Ambulatory Visit: Payer: PRIVATE HEALTH INSURANCE | Admitting: Podiatrist

## 2013-12-20 ENCOUNTER — Encounter (HOSPITAL_COMMUNITY): Payer: Self-pay | Admitting: Emergency Medicine

## 2013-12-20 ENCOUNTER — Emergency Department (HOSPITAL_COMMUNITY)
Admission: EM | Admit: 2013-12-20 | Discharge: 2013-12-20 | Disposition: A | Discharge status: Home / Self Care | Payer: PRIVATE HEALTH INSURANCE | Attending: Emergency Medicine | Admitting: Emergency Medicine

## 2013-12-20 DIAGNOSIS — F3289 Other specified depressive episodes: Secondary | ICD-10-CM | POA: Insufficient documentation

## 2013-12-20 DIAGNOSIS — Z7901 Long term (current) use of anticoagulants: Secondary | ICD-10-CM | POA: Insufficient documentation

## 2013-12-20 DIAGNOSIS — H409 Unspecified glaucoma: Secondary | ICD-10-CM | POA: Insufficient documentation

## 2013-12-20 DIAGNOSIS — Z86711 Personal history of pulmonary embolism: Secondary | ICD-10-CM | POA: Insufficient documentation

## 2013-12-20 DIAGNOSIS — I10 Essential (primary) hypertension: Secondary | ICD-10-CM | POA: Insufficient documentation

## 2013-12-20 DIAGNOSIS — L408 Other psoriasis: Secondary | ICD-10-CM | POA: Insufficient documentation

## 2013-12-20 DIAGNOSIS — G8929 Other chronic pain: Secondary | ICD-10-CM | POA: Insufficient documentation

## 2013-12-20 DIAGNOSIS — Z862 Personal history of diseases of the blood and blood-forming organs and certain disorders involving the immune mechanism: Secondary | ICD-10-CM | POA: Insufficient documentation

## 2013-12-20 DIAGNOSIS — J45909 Unspecified asthma, uncomplicated: Secondary | ICD-10-CM | POA: Insufficient documentation

## 2013-12-20 DIAGNOSIS — Z8701 Personal history of pneumonia (recurrent): Secondary | ICD-10-CM | POA: Insufficient documentation

## 2013-12-20 DIAGNOSIS — K219 Gastro-esophageal reflux disease without esophagitis: Secondary | ICD-10-CM | POA: Insufficient documentation

## 2013-12-20 DIAGNOSIS — Z8639 Personal history of other endocrine, nutritional and metabolic disease: Secondary | ICD-10-CM | POA: Insufficient documentation

## 2013-12-20 DIAGNOSIS — M549 Dorsalgia, unspecified: Secondary | ICD-10-CM | POA: Insufficient documentation

## 2013-12-20 DIAGNOSIS — F411 Generalized anxiety disorder: Secondary | ICD-10-CM | POA: Insufficient documentation

## 2013-12-20 DIAGNOSIS — F209 Schizophrenia, unspecified: Secondary | ICD-10-CM | POA: Insufficient documentation

## 2013-12-20 DIAGNOSIS — R011 Cardiac murmur, unspecified: Secondary | ICD-10-CM | POA: Insufficient documentation

## 2013-12-20 DIAGNOSIS — F329 Major depressive disorder, single episode, unspecified: Secondary | ICD-10-CM | POA: Insufficient documentation

## 2013-12-20 DIAGNOSIS — M129 Arthropathy, unspecified: Secondary | ICD-10-CM | POA: Insufficient documentation

## 2013-12-20 DIAGNOSIS — Z981 Arthrodesis status: Secondary | ICD-10-CM | POA: Insufficient documentation

## 2013-12-20 DIAGNOSIS — Z86718 Personal history of other venous thrombosis and embolism: Secondary | ICD-10-CM | POA: Insufficient documentation

## 2013-12-20 DIAGNOSIS — Z79899 Other long term (current) drug therapy: Secondary | ICD-10-CM | POA: Insufficient documentation

## 2013-12-20 DIAGNOSIS — G4730 Sleep apnea, unspecified: Secondary | ICD-10-CM | POA: Insufficient documentation

## 2013-12-20 LAB — URINALYSIS, ROUTINE W REFLEX MICROSCOPIC
BILIRUBIN URINE: NEGATIVE
Glucose, UA: NEGATIVE mg/dL
Hgb urine dipstick: NEGATIVE
KETONES UR: NEGATIVE mg/dL
NITRITE: NEGATIVE
PROTEIN: NEGATIVE mg/dL
Specific Gravity, Urine: 1.016 (ref 1.005–1.030)
UROBILINOGEN UA: 0.2 mg/dL (ref 0.0–1.0)
pH: 7 (ref 5.0–8.0)

## 2013-12-20 LAB — URINE MICROSCOPIC-ADD ON

## 2013-12-20 MED ORDER — HYDROMORPHONE HCL PF 2 MG/ML IJ SOLN
2.0000 mg | Freq: Once | INTRAMUSCULAR | Status: AC
  Administered 2013-12-20: 2 mg via INTRAMUSCULAR
  Filled 2013-12-20: qty 1

## 2013-12-20 NOTE — Discharge Instructions (Signed)
Chronic Pain °Chronic pain can be defined as pain that is off and on and lasts for 3 6 months or longer. Many things cause chronic pain, which can make it difficult to make a diagnosis. There are many treatment options available for chronic pain. However, finding a treatment that works well for you may require trying various approaches until the right one is found. Many people benefit from a combination of two or more types of treatment to control their pain. °SYMPTOMS  °Chronic pain can occur anywhere in the body and can range from mild to very severe. Some types of chronic pain include: °· Headache. °· Low back pain. °· Cancer pain. °· Arthritis pain. °· Neurogenic pain. This is pain resulting from damage to nerves. ° People with chronic pain may also have other symptoms such as: °· Depression. °· Anger. °· Insomnia. °· Anxiety. °DIAGNOSIS  °Your health care provider will help diagnose your condition over time. In many cases, the initial focus will be on excluding possible conditions that could be causing the pain. Depending on your symptoms, your health care provider may order tests to diagnose your condition. Some of these tests may include:  °· Blood tests.   °· CT scan.   °· MRI.   °· X-rays.   °· Ultrasounds.   °· Nerve conduction studies.   °You may need to see a specialist.  °TREATMENT  °Finding treatment that works well may take time. You may be referred to a pain specialist. He or she may prescribe medicine or therapies, such as:  °· Mindful meditation or yoga. °· Shots (injections) of numbing or pain-relieving medicines into the spine or area of pain. °· Local electrical stimulation. °· Acupuncture.   °· Massage therapy.   °· Aroma, color, light, or sound therapy.   °· Biofeedback.   °· Working with a physical therapist to keep from getting stiff.   °· Regular, gentle exercise.   °· Cognitive or behavioral therapy.   °· Group support.   °Sometimes, surgery may be recommended.  °HOME CARE INSTRUCTIONS   °· Take all medicines as directed by your health care provider.   °· Lessen stress in your life by relaxing and doing things such as listening to calming music.   °· Exercise or be active as directed by your health care provider.   °· Eat a healthy diet and include things such as vegetables, fruits, fish, and lean meats in your diet.   °· Keep all follow-up appointments with your health care provider.   °· Attend a support group with others suffering from chronic pain. °SEEK MEDICAL CARE IF:  °· Your pain gets worse.   °· You develop a new pain that was not there before.   °· You cannot tolerate medicines given to you by your health care provider.   °· You have new symptoms since your last visit with your health care provider.   °SEEK IMMEDIATE MEDICAL CARE IF:  °· You feel weak.   °· You have decreased sensation or numbness.   °· You lose control of bowel or bladder function.   °· Your pain suddenly gets much worse.   °· You develop shaking. °· You develop chills. °· You develop confusion. °· You develop chest pain. °· You develop shortness of breath.   °MAKE SURE YOU: °· Understand these instructions. °· Will watch your condition. °· Will get help right away if you are not doing well or get worse. °Document Released: 06/02/2002 Document Revised: 05/13/2013 Document Reviewed: 03/06/2013 °ExitCare® Patient Information ©2014 [employer]. ° °Chronic Back Pain ° When back pain lasts longer than 3 months, it is called chronic back pain. People with   chronic back pain often go through certain periods that are more intense (flare-ups).  °CAUSES °Chronic back pain can be caused by wear and tear (degeneration) on different structures in your back. These structures include: °· The bones of your spine (vertebrae) and the joints surrounding your spinal cord and nerve roots (facets). °· The strong, fibrous tissues that connect your vertebrae (ligaments). °Degeneration of these structures may result in pressure on your nerves.  This can lead to constant pain. °HOME CARE INSTRUCTIONS °· Avoid bending, heavy lifting, prolonged sitting, and activities which make the problem worse. °· Take brief periods of rest throughout the day to reduce your pain. Lying down or standing usually is better than sitting while you are resting. °· Take over-the-counter or prescription medicines only as directed by your caregiver. °SEEK IMMEDIATE MEDICAL CARE IF:  °· You have weakness or numbness in one of your legs or feet. °· You have trouble controlling your bladder or bowels. °· You have nausea, vomiting, abdominal pain, shortness of breath, or fainting. °Document Released: 10/18/2004 Document Revised: 12/03/2011 Document Reviewed: 08/25/2011 °ExitCare® Patient Information ©2014 [employer]. ° °

## 2013-12-20 NOTE — ED Notes (Signed)
C/o lower back pain and spasms, pain onset 1 month ago worsening over 1 week taking care of son at home. She has a PMH of back pain. She is taking Zanaflex from her PCP. Pt is ambulatory. Per EMS, she has a h/o psoriasis which is inflammed her joints.  VS: P:96 RR:18 BP: 130/70 SPo2: 96

## 2013-12-20 NOTE — ED Notes (Signed)
Bed: WA21 Expected date:  Expected time:  Means of arrival:  Comments: EMS 23F backpain

## 2013-12-21 NOTE — ED Provider Notes (Signed)
CSN: [id number]     Arrival date & time 12/20/13  0309 History   First MD Initiated Contact with Patient 12/20/13 0320     Chief Complaint  Patient presents with  . Back Pain     (Consider location/radiation/quality/duration/timing/severity/associated sxs/prior Treatment) HPI Comments: Pt comes in with cc of back pain. Hx of chronic back pain, and other medical co morbidities. Pt recently transferred her care from her old PCP to a new one - and the new team didn't d/c her with narcotic meds on 1st visit as per their policy. She is taking tramadol for pain. Also stopped seeing pain clinic due to risk of bleed from the the injections (she is on coumadin). Back pain is same as usual, just worse, as she was trying to move her child. No associated numbness, weakness, urinary incontinence, urinary retention, bowel incontinence, saddle anesthesia.    Patient is a 73 y.o. female presenting with back pain. The history is provided by the patient.  Back Pain Associated symptoms: no abdominal pain, no chest pain, no dysuria, no headaches, no numbness and no weakness     Past Medical History  Diagnosis Date  . Depression   . Schizophrenia   . Hyperlipidemia   . Hypertension   . Chronic pain     "over my whole body" (03/30/2013)  . 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 DVT (deep venous thrombosis)     per 01/17/10 d/c summary- "remote hx of dvt"?  . Iron deficiency anemia   . Glaucoma   . Hemorrhoids   . Asthma   . Anxiety   . GERD (gastroesophageal reflux disease)   . Psoriasis 04/29/2012    New onset evaluated by Dr Marylou FlesherWilliam Huang, [facility], [facility] 6/13  Rx 0.1% Tacrolimus ointment  . Complication of anesthesia     "because I have sleep apnea" (03/30/2013)  . Heart murmur   . Chest pain, exertional   . Chronic bronchitis   . Sleep apnea     "waiting on my CPAP" (03/30/2013)  . Pneumonia     "a few times" (03/30/2013)  . Exertional shortness of breath     "&  sometimes when laying down" (03/30/2013)  . Daily headache     "last 2 months" (03/30/2013)  . Arthritis     "joints" (03/30/2013)  . Varicose veins of legs   . Psoriasis    Past Surgical History  Procedure Laterality Date  . Spinal fusion      2004  . Carpal tunnel release Bilateral   . Tubal ligation  1973  . Dilation and curettage of uterus  1970's    "once" (03/30/2013)  . 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 Joint replacement     Family History  Problem Relation Age of Onset  . Diabetes Sister   . Colon cancer Brother 40   History  Substance Use Topics  . Smoking status: Never Smoker   . Smokeless tobacco: Never Used  . Alcohol Use: No   OB History   Grav Para Term Preterm Abortions TAB SAB Ect Mult Living                 Review of Systems  Constitutional: Positive for activity change.  Respiratory: Negative for shortness of breath.   Cardiovascular: Negative for chest pain.  Gastrointestinal: Negative for nausea, vomiting and abdominal pain.  Genitourinary: Negative for dysuria.  Musculoskeletal: Positive for back pain. Negative for neck pain.  Neurological: Negative for dizziness, weakness, numbness and headaches.      Allergies  Tramadol and Vicodin  Home Medications   Current Outpatient Rx  Name  Route  Sig  Dispense  Refill  . albuterol (PROVENTIL HFA;VENTOLIN HFA) 108 (90 BASE) MCG/ACT inhaler   Inhalation   Inhale 2 puffs into the lungs every 4 (four) hours as needed for wheezing or shortness of breath.   1 Inhaler   0   . atenolol (TENORMIN) 100 MG tablet   Oral   Take 100 mg by mouth daily.          Marland Kitchen esomeprazole (NEXIUM) 40 MG capsule   Oral   Take 1 capsule (40 mg total) by mouth daily.   30 capsule   1   . FLUoxetine (PROZAC) 20 MG capsule   Oral   Take 60 mg by mouth every morning.          . gabapentin (NEURONTIN) 100 MG capsule   Oral   Take 200 mg by mouth 3 (three) times daily.         . hydrochlorothiazide  (HYDRODIURIL) 25 MG tablet   Oral   Take 25 mg by mouth every morning.         . hydrOXYzine (ATARAX/VISTARIL) 10 MG tablet   Oral   Take 10 mg by mouth at bedtime as needed for itching.         Marland Kitchen lisinopril (PRINIVIL,ZESTRIL) 10 MG tablet   Oral   Take 10 mg by mouth daily.          . meclizine (ANTIVERT) 25 MG tablet   Oral   Take 25 mg by mouth 3 (three) times daily as needed for dizziness.          . sucralfate (CARAFATE) 1 G tablet   Oral   Take 1 tablet (1 g total) by mouth 4 (four) times daily -  with meals and at bedtime.   120 tablet   0   . warfarin (COUMADIN) 5 MG tablet   Oral   Take 5 mg by mouth every morning.          Marland Kitchen PRESCRIPTION MEDICATION   Topical   Apply 1 application topically daily. Silver sulfadiazine / triamcinolone cream= compounded by [facility] For psoriasis          BP 112/74  Pulse 70  Temp(Src) 97.9 F (36.6 C) (Oral)  Resp 16  SpO2 97%  LMP 11/22/1996 Physical Exam  Nursing note and vitals reviewed. Constitutional: She is oriented to person, place, and time. She appears well-developed and well-nourished.  HENT:  Head: Normocephalic and atraumatic.  Eyes: EOM are normal. Pupils are equal, round, and reactive to light.  Neck: Neck supple.  Cardiovascular: Normal rate, regular rhythm and normal heart sounds.   No murmur heard. Pulmonary/Chest: Effort normal. No respiratory distress.  Abdominal: Soft. She exhibits no distension. There is no tenderness. There is no rebound and no guarding.  Neurological: She is alert and oriented to person, place, and time.  Pt has tenderness over the lumbar region No step offs, no erythema. Pt has 1+ patellar reflex bilaterally. Able to discriminate between sharp and dull. Able to ambulate   Skin: Skin is warm and dry.    ED Course  Procedures (including critical care time) Labs Review Labs Reviewed  URINALYSIS, ROUTINE W REFLEX MICROSCOPIC - Abnormal; Notable for the following:     APPearance CLOUDY (*)    Leukocytes, UA SMALL (*)  All other components within normal limits  URINE MICROSCOPIC-ADD ON - Abnormal; Notable for the following:    Squamous Epithelial / LPF FEW (*)    Bacteria, UA FEW (*)    All other components within normal limits   Imaging Review No results found.   EKG Interpretation None      MDM   Final diagnoses:  Chronic back pain    Pt comes in with cc of back pain. Given her hx of back pain, her stating that this is flare up of her typical pain, and no red flags on hx or exam suggesting cord involvement or pathologic fractures - we will give her 1 IM dilaudid in the ED. She is well aware that we will not prescribe pain meds for chronic pain issues.   Derwood Kaplan, MD 12/21/13 0127

## 2013-12-23 ENCOUNTER — Telehealth: Payer: Self-pay | Admitting: Family Medicine

## 2013-12-23 DIAGNOSIS — E785 Hyperlipidemia, unspecified: Secondary | ICD-10-CM

## 2013-12-23 MED ORDER — ATORVASTATIN CALCIUM 40 MG PO TABS: 40.0000 mg | ORAL_TABLET | Freq: Every day | ORAL | Status: DC

## 2013-12-23 NOTE — Telephone Encounter (Signed)
-   We can discuss pain management at that visit, but I won't prescribe anything at this time.

## 2013-12-23 NOTE — Telephone Encounter (Signed)
Spoke with patient and informed her of below. She has an appointment on Tuesday 4/7 and was wanting to know if she can get something for pain until then.

## 2013-12-23 NOTE — Telephone Encounter (Signed)
-   LDL grossly elevated, and pt not already on statin.

## 2013-12-24 ENCOUNTER — Other Ambulatory Visit: Payer: PRIVATE HEALTH INSURANCE

## 2013-12-24 ENCOUNTER — Emergency Department (INDEPENDENT_AMBULATORY_CARE_PROVIDER_SITE_OTHER)
Admission: EM | Admit: 2013-12-24 | Discharge: 2013-12-24 | Disposition: A | Discharge status: Home / Self Care | Payer: PRIVATE HEALTH INSURANCE | Source: Home / Self Care | Attending: Family Medicine | Admitting: Family Medicine

## 2013-12-24 ENCOUNTER — Ambulatory Visit: Payer: PRIVATE HEALTH INSURANCE

## 2013-12-24 ENCOUNTER — Encounter (HOSPITAL_COMMUNITY): Payer: Self-pay | Admitting: Emergency Medicine

## 2013-12-24 DIAGNOSIS — M129 Arthropathy, unspecified: Secondary | ICD-10-CM

## 2013-12-24 MED ORDER — TRAMADOL HCL 50 MG PO TABS: 50.0000 mg | ORAL_TABLET | Freq: Four times a day (QID) | ORAL | Status: DC | PRN

## 2013-12-24 NOTE — ED Provider Notes (Signed)
CSN: [id number]     Arrival date & time 12/24/13  1549 History   None    Chief Complaint  Patient presents with  . Medication Refill   (Consider location/radiation/quality/duration/timing/severity/associated sxs/prior Treatment) Patient is a 73 y.o. female presenting with general illness.  Illness Location:  Mult joints Severity:  Mild Onset quality:  Gradual Chronicity:  Chronic Context:  Has run out of meds for pain , appt with doc next week.   Past Medical History  Diagnosis Date  . Depression   . Schizophrenia   . Hyperlipidemia   . Hypertension   . Chronic pain     "over my whole body" (03/30/2013)  . 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 DVT (deep venous thrombosis)     per 01/17/10 d/c summary- "remote hx of dvt"?  . Iron deficiency anemia   . Glaucoma   . Hemorrhoids   . Asthma   . Anxiety   . GERD (gastroesophageal reflux disease)   . Psoriasis 04/29/2012    New onset evaluated by Dr Marylou Flesher, Dermatology, [facility] 6/13  Rx 0.1% Tacrolimus ointment  . Complication of anesthesia     "because I have sleep apnea" (03/30/2013)  . Heart murmur   . Chest pain, exertional   . Chronic bronchitis   . Sleep apnea     "waiting on my CPAP" (03/30/2013)  . Pneumonia     "a few times" (03/30/2013)  . Exertional shortness of breath     "& sometimes when laying down" (03/30/2013)  . Daily headache     "last 2 months" (03/30/2013)  . Arthritis     "joints" (03/30/2013)  . Varicose veins of legs   . Psoriasis    Past Surgical History  Procedure Laterality Date  . Spinal fusion      2004  . Carpal tunnel release Bilateral   . Tubal ligation  1973  . Dilation and curettage of uterus  1970's    "once" (03/30/2013)  . 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 Joint replacement     Family History  Problem Relation Age of Onset  . Diabetes Sister   . Colon cancer Brother 40   History  Substance Use Topics  . Smoking status: Never Smoker   .  Smokeless tobacco: Never Used  . Alcohol Use: No   OB History   Grav Para Term Preterm Abortions TAB SAB Ect Mult Living                 Review of Systems  Constitutional: Negative.   Musculoskeletal: Positive for arthralgias.  Skin: Negative.     Allergies  Vicodin  Home Medications   Current Outpatient Rx  Name  Route  Sig  Dispense  Refill  . albuterol (PROVENTIL HFA;VENTOLIN HFA) 108 (90 BASE) MCG/ACT inhaler   Inhalation   Inhale 2 puffs into the lungs every 4 (four) hours as needed for wheezing or shortness of breath.   1 Inhaler   0   . atenolol (TENORMIN) 100 MG tablet   Oral   Take 100 mg by mouth daily.          Marland Kitchen atorvastatin (LIPITOR) 40 MG tablet   Oral   Take 1 tablet (40 mg total) by mouth daily.   90 tablet   3   . esomeprazole (NEXIUM) 40 MG capsule   Oral   Take 1 capsule (40 mg total) by mouth daily.   30 capsule  1   . FLUoxetine (PROZAC) 20 MG capsule   Oral   Take 60 mg by mouth every morning.          . gabapentin (NEURONTIN) 100 MG capsule   Oral   Take 200 mg by mouth 3 (three) times daily.         . hydrochlorothiazide (HYDRODIURIL) 25 MG tablet   Oral   Take 25 mg by mouth every morning.         . hydrOXYzine (ATARAX/VISTARIL) 10 MG tablet   Oral   Take 10 mg by mouth at bedtime as needed for itching.         Marland Kitchen. lisinopril (PRINIVIL,ZESTRIL) 10 MG tablet   Oral   Take 10 mg by mouth daily.          . meclizine (ANTIVERT) 25 MG tablet   Oral   Take 25 mg by mouth 3 (three) times daily as needed for dizziness.          Marland Kitchen. PRESCRIPTION MEDICATION   Topical   Apply 1 application topically daily. Silver sulfadiazine / triamcinolone cream= compounded by [facility] For psoriasis         . sucralfate (CARAFATE) 1 G tablet   Oral   Take 1 tablet (1 g total) by mouth 4 (four) times daily -  with meals and at bedtime.   120 tablet   0   . traMADol (ULTRAM) 50 MG tablet   Oral   Take 1 tablet (50 mg total) by mouth  every 6 (six) hours as needed. For pain   30 tablet   0   . warfarin (COUMADIN) 5 MG tablet   Oral   Take 5 mg by mouth every morning.           BP 150/72  Pulse 72  Temp(Src) 98.6 F (37 C) (Oral)  Resp 16  SpO2 100%  LMP 11/22/1996 Physical Exam  Nursing note and vitals reviewed. Constitutional: She is oriented to person, place, and time. She appears well-developed and well-nourished.  Musculoskeletal: She exhibits tenderness. She exhibits no edema.  Neurological: She is alert and oriented to person, place, and time.  Skin: Skin is warm and dry.    ED Course  Procedures (including critical care time) Labs Review Labs Reviewed - No data to display Imaging Review No results found.   MDM   1. Arthritis involving multiple sites        Linna HoffJames D Leza Apsey, MD 12/24/13 2049

## 2013-12-24 NOTE — ED Notes (Addendum)
Pt  Requests  A  Refill  Of her  Pain meds     She  Also  Reports    Symptoms  Of  Leg cramps       And is  Requesting     Check  Of her  Potassium          She ambulated  To exam room              With   A         Slow  Steady      Steady   Gait

## 2013-12-24 NOTE — Discharge Instructions (Signed)
See your doctor as planned. °

## 2013-12-28 ENCOUNTER — Other Ambulatory Visit: Payer: Self-pay | Admitting: Pharmacist

## 2013-12-28 MED ORDER — WARFARIN SODIUM 5 MG PO TABS: ORAL_TABLET | ORAL | Status: DC

## 2013-12-29 ENCOUNTER — Encounter: Payer: Self-pay | Admitting: Family Medicine

## 2013-12-29 ENCOUNTER — Ambulatory Visit (INDEPENDENT_AMBULATORY_CARE_PROVIDER_SITE_OTHER): Payer: PRIVATE HEALTH INSURANCE | Admitting: Family Medicine

## 2013-12-29 VITALS — BP 120/78 | HR 57 | Temp 98.7°F | Ht 65.0 in | Wt 246.0 lb

## 2013-12-29 DIAGNOSIS — K219 Gastro-esophageal reflux disease without esophagitis: Secondary | ICD-10-CM

## 2013-12-29 DIAGNOSIS — G8929 Other chronic pain: Secondary | ICD-10-CM

## 2013-12-29 MED ORDER — GABAPENTIN 300 MG PO CAPS: 300.0000 mg | ORAL_CAPSULE | Freq: Three times a day (TID) | ORAL | Status: DC

## 2013-12-29 MED ORDER — TRAMADOL HCL 50 MG PO TABS: 100.0000 mg | ORAL_TABLET | Freq: Two times a day (BID) | ORAL | Status: DC | PRN

## 2013-12-29 NOTE — Progress Notes (Addendum)
Patient ID: [name] SnowballDora C [name], female   DOB: 08/22/1941, 73 y.o.   MRN: [id number]   Subjective:  HPI:   [name] SnowballDora C [name] is a 73 y.o. female with a history of chronic pain, arthritis, depression, schizophrenia here for pain management.  She has arthritis in her back, knees, ankles, wrists, and "pain all over my body." She has taken tramdol twice a day for this with good results, though this wears off quickly. Tylenol has helped mildly. She is seen at the arthritis clinic and has an appointment for ankle injections later this week. She relates that since she has been on coumadin (for h/o PE) she has been unable to receive back injections. Her pain has also improved somewhat after taking prozac prescribed for depression. She has been seen at a pain clinic in the past but left because they wanted to change her medications too much and she just wanted to take tramadol. No saddle anesthesia or incontinence.   The pain in her feet feels like burning numbness and pain that has been there for years unchanged, non-radiating. She takes gabapentin 200mg  TID as directed and doesn't think it's helping.   She denies chest pain, SOB, abdominal pain/cramping, N/V/D. She does not have constipation.   Records indicate she visited urgent care right after our last office visit for pain medication and has also visited the urgent care late last week and the [place] ED since that time.   Review of Systems:  Per HPI. All other systems reviewed and are negative.    Past Medical History: Patient Active Problem List   Diagnosis Date Noted  . Leg pain 12/15/2013  . Back pain 06/09/2013  . Cellulitis of leg, left 03/30/2013  . Cellulitis of leg, right 03/30/2013  . Crack cocaine use 10/03/2012  . Vertigo 05/31/2012  . Orthostatic hypotension 05/31/2012  . Psoriasis 04/29/2012  . Bradycardia 12/12/2011  . Rectal bleeding 12/11/2011  . Chronic anticoagulation 12/11/2011  . Pre-syncope 12/11/2011  . Acute  post-hemorrhagic anemia 10/07/2011  . Heterozygous factor V Leiden mutation 01/18/2011  . Anemia, normocytic normochromic 01/18/2011  . GERD 12/21/2010  . Family history of malignant neoplasm of gastrointestinal tract 12/21/2010  . Iron deficiency anemia, unspecified 12/21/2010  . Varicose veins of both lower extremities with pain 12/15/2010  . Encounter for long-term (current) use of anticoagulants 11/30/2010  . Counseling on health promotion and disease prevention 11/12/2010  . Lung nodule 11/12/2010  . CPK, ABNORMAL 06/18/2010  . PULMONARY EMBOLISM 01/30/2010  . HYPERLIPIDEMIA 01/10/2010  . 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 OBESITY, UNSPECIFIED 09/26/2009  . SCHIZOPHRENIA 09/26/2009  . DEPRESSION 09/26/2009  . Other chronic pain 09/26/2009  . Essential hypertension, benign 09/26/2009    Medications: reviewed and updated Current Outpatient Prescriptions  Medication Sig Dispense Refill  . albuterol (PROVENTIL HFA;VENTOLIN HFA) 108 (90 BASE) MCG/ACT inhaler Inhale 2 puffs into the lungs every 4 (four) hours as needed for wheezing or shortness of breath.  1 Inhaler  0  . atenolol (TENORMIN) 100 MG tablet Take 100 mg by mouth daily.       Marland Kitchen. atorvastatin (LIPITOR) 40 MG tablet Take 1 tablet (40 mg total) by mouth daily.  90 tablet  3  . esomeprazole (NEXIUM) 40 MG capsule Take 1 capsule (40 mg total) by mouth daily.  30 capsule  1  . FLUoxetine (PROZAC) 20 MG capsule Take 60 mg by mouth every morning.       . gabapentin (NEURONTIN) 300 MG capsule Take 1 capsule (300  mg total) by mouth 3 (three) times daily.  90 capsule  5  . hydrochlorothiazide (HYDRODIURIL) 25 MG tablet Take 25 mg by mouth every morning.      . hydrOXYzine (ATARAX/VISTARIL) 10 MG tablet Take 10 mg by mouth at bedtime as needed for itching.      Marland Kitchen lisinopril (PRINIVIL,ZESTRIL) 10 MG tablet Take 10 mg by mouth daily.       . meclizine (ANTIVERT) 25 MG tablet Take 25 mg by mouth 3 (three) times daily as needed for  dizziness.       Marland Kitchen PRESCRIPTION MEDICATION Apply 1 application topically daily. Silver sulfadiazine / triamcinolone cream= compounded by [facility] For psoriasis      . sucralfate (CARAFATE) 1 G tablet Take 1 tablet (1 g total) by mouth 4 (four) times daily -  with meals and at bedtime.  120 tablet  0  . traMADol (ULTRAM) 50 MG tablet Take 1 tablet (50 mg total) by mouth every 6 (six) hours as needed. For pain  30 tablet  0  . traMADol (ULTRAM) 50 MG tablet Take 2 tablets (100 mg total) by mouth every 12 (twelve) hours as needed.  30 tablet  0  . warfarin (COUMADIN) 5 MG tablet Take 1 tablet (5 mg) by mouth daily or as instructed by the coumadin clinic  30 tablet  3   No current facility-administered medications for this visit.    Objective:  Physical Exam: BP 120/78  Pulse 57  Temp(Src) 98.7 F (37.1 C) (Oral)  Ht 5\' 5"  (1.651 m)  Wt 246 lb (111.585 kg)  BMI 40.94 kg/m2  LMP 11/22/1996  Gen: Obese 73 y.o. female in NAD HEENT: MMM, EOMI, PERRL, anicteric sclerae CV: RRR, no MRG, no JVD Resp: Non-labored, CTAB, no wheezes noted, BS decreased d/t habitus Abd: Soft, NTND, BS present, no guarding or organomegaly MSK: 2+ pitting edema noted with many varicosities, full ROM without joint deformity. Crepitus at the knees bilaterally.  Neuro: Alert and oriented, speech normal, no focal deficits. Antalgic gait.     Assessment:     [name] [name] is a 73 y.o. female here for pain management    Plan:     See problem list for problem-specific plans.

## 2013-12-29 NOTE — Assessment & Plan Note (Signed)
Continue nexium and lifestyle modifications.

## 2013-12-29 NOTE — Progress Notes (Signed)
[name] [name] did not show for visit on 12/08/2013.

## 2013-12-29 NOTE — Patient Instructions (Signed)
-   Take tramadol 2 tablets two times a day as directed. Do not take more than this.  - Follow up at the arthritis clinic. - Continue getting nexium OTC.  - Take neurontin (gabapentin) at the new dose (300mg ) three times a day to help with pain in your feet.   Make a 1 month follow up appointment.  - Dr. Jarvis NewcomerGrunz

## 2013-12-29 NOTE — Assessment & Plan Note (Addendum)
Due to arthritis. No red flags concerning back pain. Advised that she is not to receive pain medications elsewhere except in emergencies. Rx tramadol #120 to be taken twice in morning and twice in the evening. F/u 1 month. Increasing gabapentin dose today, will likely continue increase in 1 month.

## 2013-12-30 ENCOUNTER — Ambulatory Visit: Payer: PRIVATE HEALTH INSURANCE

## 2013-12-30 ENCOUNTER — Other Ambulatory Visit: Payer: PRIVATE HEALTH INSURANCE

## 2014-01-04 ENCOUNTER — Encounter: Payer: PRIVATE HEALTH INSURANCE | Admitting: Obstetrics & Gynecology

## 2014-01-05 ENCOUNTER — Ambulatory Visit: Payer: PRIVATE HEALTH INSURANCE

## 2014-01-05 ENCOUNTER — Other Ambulatory Visit: Payer: PRIVATE HEALTH INSURANCE

## 2014-01-05 ENCOUNTER — Telehealth: Payer: Self-pay | Admitting: Pharmacist

## 2014-01-05 NOTE — Telephone Encounter (Signed)
Pt FTKA today for lab and coumadin clinic. Called and spoke to Ms. Laural BenesJohnson who is not feeling well and she reports feeling depressed. She is taking her Prozac daily. Rescheduled appointments for lab and coumadin clinic for 01/12/14 at 10am for lab and 10:15am for coumadin clinic.   Thank you, Christell Faithhris Zuriel Roskos, PharmD

## 2014-01-10 ENCOUNTER — Encounter (HOSPITAL_COMMUNITY): Payer: Self-pay | Admitting: Emergency Medicine

## 2014-01-10 ENCOUNTER — Emergency Department (INDEPENDENT_AMBULATORY_CARE_PROVIDER_SITE_OTHER)
Admission: EM | Admit: 2014-01-10 | Discharge: 2014-01-10 | Disposition: A | Discharge status: Home / Self Care | Payer: PRIVATE HEALTH INSURANCE | Source: Home / Self Care | Attending: Emergency Medicine | Admitting: Emergency Medicine

## 2014-01-10 DIAGNOSIS — M722 Plantar fascial fibromatosis: Secondary | ICD-10-CM

## 2014-01-10 MED ORDER — METHYLPREDNISOLONE ACETATE 40 MG/ML IJ SUSP
INTRAMUSCULAR | Status: AC
  Filled 2014-01-10: qty 5

## 2014-01-10 MED ORDER — METHYLPREDNISOLONE ACETATE 80 MG/ML IJ SUSP
80.0000 mg | Freq: Once | INTRAMUSCULAR | Status: AC
  Administered 2014-01-10: 80 mg via INTRA_ARTICULAR

## 2014-01-10 NOTE — ED Notes (Signed)
C/o pain in leg and foot . Ambulatory w free fluid gait. NAD

## 2014-01-10 NOTE — ED Provider Notes (Signed)
Chief Complaint   Chief Complaint  Patient presents with  . Foot Pain    History of Present Illness   [name] SnowballDora C Seth is a 73 year old female who has bilateral heel pain. She's had this before and received corticosteroid injections with good results. The patient describes the pain as being the plantar surface of the heels but also the size of the heel, the back of the heel, and the Achilles tendon. There is no swelling and no history of injury. No numbness, tingling, or weakness.  Review of Systems   Other than as noted above, the patient denies any of the following symptoms: Systemic:  No fevers or chills. Musculoskeletal:  No joint pain or arthritis.  Neurological:  No muscular weakness, paresthesias.   PMFSH   Past medical history, family history, social history, meds, and allergies were reviewed.     Physical  Examination     Vital signs:  BP 159/93  Pulse 76  Temp(Src) 98.8 F (37.1 C) (Oral)  Resp 18  SpO2 95%  LMP 11/22/1996 Gen:  Alert and oriented times 3.  In no distress. Musculoskeletal:  Exam of the foot reveals she has very widespread pain, localized to the plantar surfaces of both heels, extending up to the lateral surface of the heels and the Achilles tendons into the ankle. Joints have full range of motion with no pain. She has bilateral paracolic sprain stasis changes ankle edema. This is worse on the left than the right. She states the pain is worse on the left than the right.  Otherwise, all joints had a full a ROM with no swelling, bruising or deformity.  No edema, pulses full. Extremities were warm and pink.  Capillary refill was brisk.  Skin:  Clear, warm and dry.  No rash. Neuro:  Alert and oriented times 3.  Muscle strength was normal.  Sensation was intact to light touch.   Procedure Note:  Verbal informed consent was obtained from the patient.  Risks and benefits were outlined with the patient.  Patient understands and accepts these risks. A time out was  called and the procedure and identity of the patient were confirmed verbally.    The procedure was then performed as follows: Since she had gotten good results with corticosteroids in the past, I offered her an injection, and she accepted this. I decided to do the left heel since this was the most symptomatic. The medial aspect of the heel was prepped with Betadine and alcohol, the insertion point was marked, and anesthetized with ethyl chloride spray. Using a 1 and 1/2 inche 27-gauge needle, 1 cc of Depo-Medrol 40 mg strength and 1 cc of 2% Xylocaine were deposited at the insertion of the plantar fascia. The patient tolerated this procedure well.  The patient tolerated the procedure well without any immediate complications.   Assessment   The encounter diagnosis was Plantar fasciitis.  Plan    1.  Meds:  The following meds were prescribed:   Discharge Medication List as of 01/10/2014  1:35 PM      2.  Patient Education/Counseling:  The patient was given appropriate handouts, self care instructions, and instructed in symptomatic relief including rest and activity, elevation, application of ice and compression.  She was given exercises to do and suggested heel lifts, ice, and stretching.  3.  Follow up:  The patient was told to follow up here if no better in 3 to 4 days, or sooner if becoming worse in any way, and given  some red flag symptoms such as worsening pain or neurological symptoms which would prompt immediate return.  Follow up here as necessary.       Reuben Likesavid C Dmiyah Liscano, MD 01/10/14 [phone]

## 2014-01-10 NOTE — ED Notes (Signed)
Advised ice to foot off and on today and to elevate as much as poss for rest of day

## 2014-01-10 NOTE — Discharge Instructions (Signed)
Plantar fasciitis is a tendonitis (inflammed tendon) of the the plantar fascia, the tendon on the bottom of the foot that supports the arch.  Often the pain is localized to the heel and can be worse first thing in the morning after getting up or after sitting for a long period of time and tends to improve as the day goes by, only to worsen later on in the afternoon or evening after you have been on your feet for a long time.  Following the program outlined below cures most cases.  If conservative measures like these don't work, corticosteroid injection, or referral to a podiatrist are other options. ° °· Wear well fitting, lace up shoes with good arch support.  Tennis or running shoes are the best.  Do not wear heels, flip-flops, scuffs, or any kind of shoe without adequate support.   °· Use a heel lift.  These can be purchased at a shoe store or pharmacy.  They come in various price ranges.  Some people find that an orthotic insert with arch support works better. °· Wear a night splint.  These can be purchased on line at www.[employer].com.  The product to look for is the "Freedom Dorsal Night Splint."  [employer] also sells other products for plantar fasciitis including heel lifts and orthotic inserts. °· Use of over the counter pain meds can be of help.  Tylenol (or acetaminophen) is the safest to use.  It often helps to take this regularly.  You can take up to 2 325 mg tablets 5 times daily, but it best to start out much lower that that, perhaps 2 325 mg tablets twice daily, then increase from there. People who are on the blood thinner warfarin have to be careful about taking high doses of Tylenol.  For people who are able to tolerate them, ibuprofen and naproxyn can also help with the pain.  You should discuss these agents with your physician before taking them.  People with chronic kidney disease, hypertension, peptic ulcer disease, and reflux can suffer adverse side effects. They should not be taken with warfarin.  The maximum dosage of ibuprofen is 800 mg 3 times daily with meals.  The maximum dosage of naprosyn is 2 and 1/2 tablets twice daily with food, but again, start out low and gradually increase the dose until adequate pain relief is achieved. Ibuprofen and naprosyn should always be taken with food. °· Ice massage is helpful.  The best way to apply this is to freeze a bottle of water, place in on a towel and roll your foot over the bottle to produce an ice massage effect. This can be done as often as every 2 to 3 hours, depending on symptoms.  At the very least, try to do after you have been on your feet for a long period of time and after doing the exercises outlined below. °· Do the exercises outlined below twice daily: ° ° ° ° ° ° ° ° ° ° ° ° ° ° ° ° ° ° °

## 2014-01-11 ENCOUNTER — Telehealth: Payer: Self-pay | Admitting: Family Medicine

## 2014-01-11 NOTE — Telephone Encounter (Signed)
Pt called and would like Vicodin  For her feet instead of tramadol. jw

## 2014-01-12 ENCOUNTER — Other Ambulatory Visit: Payer: PRIVATE HEALTH INSURANCE

## 2014-01-12 ENCOUNTER — Ambulatory Visit: Payer: PRIVATE HEALTH INSURANCE

## 2014-01-12 NOTE — Telephone Encounter (Signed)
Pt called and now would like a refill on her Tramadol called in. jw

## 2014-01-12 NOTE — Telephone Encounter (Signed)
Last tramadol Rx was 50mg  #120 on 12/29/2013 to be taken two pills BID. This should last 30 days. It would be poor patient care to prescribe her more pain medication at this time. I will see her at follow up on 01/29/2014.

## 2014-01-12 NOTE — Telephone Encounter (Signed)
She states that doctor is not experienced and familiar with her "older people diseases"

## 2014-01-12 NOTE — Telephone Encounter (Signed)
Spoke with patient she wants to be switched to Vicodin. I informed her of below and she now has been speaking about injections

## 2014-01-13 ENCOUNTER — Ambulatory Visit (HOSPITAL_BASED_OUTPATIENT_CLINIC_OR_DEPARTMENT_OTHER): Payer: PRIVATE HEALTH INSURANCE | Admitting: Pharmacist

## 2014-01-13 ENCOUNTER — Encounter (HOSPITAL_COMMUNITY): Payer: Self-pay | Admitting: Emergency Medicine

## 2014-01-13 ENCOUNTER — Other Ambulatory Visit (HOSPITAL_BASED_OUTPATIENT_CLINIC_OR_DEPARTMENT_OTHER): Payer: PRIVATE HEALTH INSURANCE

## 2014-01-13 ENCOUNTER — Emergency Department (HOSPITAL_COMMUNITY)
Admission: EM | Admit: 2014-01-13 | Discharge: 2014-01-13 | Disposition: A | Discharge status: Home / Self Care | Payer: PRIVATE HEALTH INSURANCE | Attending: Emergency Medicine | Admitting: Emergency Medicine

## 2014-01-13 DIAGNOSIS — Z8669 Personal history of other diseases of the nervous system and sense organs: Secondary | ICD-10-CM | POA: Insufficient documentation

## 2014-01-13 DIAGNOSIS — I2699 Other pulmonary embolism without acute cor pulmonale: Secondary | ICD-10-CM

## 2014-01-13 DIAGNOSIS — F329 Major depressive disorder, single episode, unspecified: Secondary | ICD-10-CM | POA: Insufficient documentation

## 2014-01-13 DIAGNOSIS — D6859 Other primary thrombophilia: Secondary | ICD-10-CM

## 2014-01-13 DIAGNOSIS — Z79899 Other long term (current) drug therapy: Secondary | ICD-10-CM | POA: Insufficient documentation

## 2014-01-13 DIAGNOSIS — F3289 Other specified depressive episodes: Secondary | ICD-10-CM | POA: Insufficient documentation

## 2014-01-13 DIAGNOSIS — Z7901 Long term (current) use of anticoagulants: Secondary | ICD-10-CM

## 2014-01-13 DIAGNOSIS — F209 Schizophrenia, unspecified: Secondary | ICD-10-CM | POA: Insufficient documentation

## 2014-01-13 DIAGNOSIS — Z86711 Personal history of pulmonary embolism: Secondary | ICD-10-CM | POA: Insufficient documentation

## 2014-01-13 DIAGNOSIS — J45909 Unspecified asthma, uncomplicated: Secondary | ICD-10-CM | POA: Insufficient documentation

## 2014-01-13 DIAGNOSIS — D6851 Activated protein C resistance: Secondary | ICD-10-CM

## 2014-01-13 DIAGNOSIS — R011 Cardiac murmur, unspecified: Secondary | ICD-10-CM | POA: Insufficient documentation

## 2014-01-13 DIAGNOSIS — G8929 Other chronic pain: Secondary | ICD-10-CM | POA: Insufficient documentation

## 2014-01-13 DIAGNOSIS — I10 Essential (primary) hypertension: Secondary | ICD-10-CM | POA: Insufficient documentation

## 2014-01-13 DIAGNOSIS — Z8701 Personal history of pneumonia (recurrent): Secondary | ICD-10-CM | POA: Insufficient documentation

## 2014-01-13 DIAGNOSIS — Z862 Personal history of diseases of the blood and blood-forming organs and certain disorders involving the immune mechanism: Secondary | ICD-10-CM | POA: Insufficient documentation

## 2014-01-13 DIAGNOSIS — Z86718 Personal history of other venous thrombosis and embolism: Secondary | ICD-10-CM | POA: Insufficient documentation

## 2014-01-13 DIAGNOSIS — K219 Gastro-esophageal reflux disease without esophagitis: Secondary | ICD-10-CM | POA: Insufficient documentation

## 2014-01-13 DIAGNOSIS — Z8739 Personal history of other diseases of the musculoskeletal system and connective tissue: Secondary | ICD-10-CM | POA: Insufficient documentation

## 2014-01-13 DIAGNOSIS — E785 Hyperlipidemia, unspecified: Secondary | ICD-10-CM | POA: Insufficient documentation

## 2014-01-13 DIAGNOSIS — F411 Generalized anxiety disorder: Secondary | ICD-10-CM | POA: Insufficient documentation

## 2014-01-13 DIAGNOSIS — Z872 Personal history of diseases of the skin and subcutaneous tissue: Secondary | ICD-10-CM | POA: Insufficient documentation

## 2014-01-13 LAB — PROTIME-INR
INR: 1.4 — AB (ref 2.00–3.50)
Protime: 16.8 Seconds — ABNORMAL HIGH (ref 10.6–13.4)

## 2014-01-13 LAB — POCT INR: INR: 1.4

## 2014-01-13 MED ORDER — HYDROMORPHONE HCL PF 1 MG/ML IJ SOLN
1.0000 mg | Freq: Once | INTRAMUSCULAR | Status: AC
  Administered 2014-01-13: 1 mg via INTRAMUSCULAR
  Filled 2014-01-13: qty 1

## 2014-01-13 NOTE — ED Notes (Signed)
Pt escorted to discharge window. Pt verbalized understanding discharge instructions. In no acute distress.  

## 2014-01-13 NOTE — Progress Notes (Signed)
INR below goal of 2-3 today.  Medications reviewed and no changes noted that would have accounted for a lower INR.  [name] [name] states that she has eaten more greens recently.  She has been on coumadin 5mg  daily since we have seen her in coumadin clinic in 2012.  I have instructed [name] [name] to take a total of 10mg  today then resume 5mg  daily.  Will check PT/INr in 2 weeks.  [name] [name] will be seeing a MD for her arthritis and also has an appt with her PCP.

## 2014-01-13 NOTE — ED Notes (Signed)
Pt reports chronic body pain and foot pain. Pain 10/10.

## 2014-01-13 NOTE — ED Provider Notes (Signed)
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 She states "I hurt all over". She denies any chest pain or shortness of breath. No specific abdominal pain, vomiting or diarrhea. She states she was at the Coumadin clinic today and decided to come to the emergency department to get her pain controlled. She has been here 17 times in the past 6 months mostly for chronic pain. Patient was recently seen by a new primary care physician who has not been giving her narcotics but instead tramadol and Neurontin which she reports has not been helping with her pain. She denies any new numbness or focal weakness. No fever.  No new back pain. No bowel or bladder incontinence.  ROS: See HPI Constitutional: no fever  Eyes: no drainage  ENT: no runny nose   Cardiovascular:  no chest pain  Resp: no SOB  GI: no vomiting GU: no dysuria Integumentary: no rash  Allergy: no hives  Musculoskeletal: no leg swelling  Neurological: no slurred speech ROS otherwise negative  PAST MEDICAL HISTORY/PAST SURGICAL HISTORY:  Past Medical History  Diagnosis Date  . Depression   . Schizophrenia   . Hyperlipidemia   . Hypertension   . Chronic pain     "over my whole body" (03/30/2013)  . 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 DVT (deep venous thrombosis)     per 01/17/10 d/c summary- "remote hx of dvt"?  . Iron deficiency anemia   . Glaucoma   . Hemorrhoids   . Asthma   . Anxiety   . GERD (gastroesophageal reflux disease)   . Psoriasis 04/29/2012    New onset evaluated by Dr Marylou FlesherWilliam Huang, Dermat[facility], [facility]  Rx 0.1% Tacrolimus ointment  . Complication of anesthesia     "because I have sleep apnea" (03/30/2013)  . Heart murmur   . Chest  pain, exertional   . Chronic bronchitis   . Sleep apnea     "waiting on my CPAP" (03/30/2013)  . Pneumonia     "a few times" (03/30/2013)  . Exertional shortness of breath     "& sometimes when laying down" (03/30/2013)  . Daily headache     "last 2 months" (03/30/2013)  . Arthritis     "joints" (03/30/2013)  . Varicose veins of legs   . Psoriasis     MEDICATIONS:  Prior to Admission medications   Medication Sig Start Date End Date Taking? Authorizing Provider  albuterol (PROVENTIL HFA;VENTOLIN HFA) 108 (90 BASE) MCG/ACT inhaler Inhale 2 puffs into the lungs every 4 (four) hours as needed for wheezing or shortness of breath. 10/21/13   Hayden Rasmussenavid Mabe, NP  atenolol (TENORMIN) 100 MG tablet Take 100 mg by mouth daily.  09/14/13   Historical Provider, MD  atorvastatin (LIPITOR) 40 MG tablet Take 1 tablet (40 mg total) by mouth daily. 12/23/13   Hazeline Junkeryan Grunz, MD  esomeprazole (NEXIUM) 40 MG capsule Take 1 capsule (40 mg total) by mouth daily. 11/26/13   Evalynn Hankins N Nasira Janusz, DO  FLUoxetine (PROZAC) 20 MG capsule Take 60 mg by mouth every morning.     Historical Provider, MD  gabapentin (NEURONTIN) 300 MG capsule Take 1 capsule (300 mg total) by mouth 3 (three) times daily. 12/29/13   Hazeline Junkeryan Grunz, MD  hydrochlorothiazide (HYDRODIURIL) 25 MG tablet Take 25 mg by mouth every morning.    Historical Provider, MD  hydrOXYzine (ATARAX/VISTARIL) 10 MG tablet Take 10 mg by mouth at bedtime as needed for itching.    Historical Provider, MD  lisinopril (PRINIVIL,ZESTRIL) 10 MG tablet Take 10 mg by mouth daily.  09/12/13   Historical Provider, MD  meclizine (ANTIVERT) 25 MG tablet Take 25 mg by mouth 3 (three) times daily as needed for dizziness.     Historical Provider, MD  PRESCRIPTION MEDICATION Apply 1 application topically daily. Silver sulfadiazine / triamcinolone cream= compounded by [facility] For psoriasis    Historical Provider, MD  sucralfate (CARAFATE) 1 G tablet Take 1 tablet (1 g total) by mouth 4 (four) times daily -  with  meals and at bedtime. 11/26/13   Catrinia Racicot N Carlon Chaloux, DO  traMADol (ULTRAM) 50 MG tablet Take 1 tablet (50 mg total) by mouth every 6 (six) hours as needed. For pain 12/24/13   Linna HoffJames D Kindl, MD  traMADol (ULTRAM) 50 MG tablet Take 2 tablets (100 mg total) by mouth every 12 (twelve) hours as needed. 12/29/13   Hazeline Junkeryan Grunz, MD  warfarin (COUMADIN) 5 MG tablet Take 1 tablet (5 mg) by mouth daily or as instructed by the coumadin clinic 12/28/13   Levert FeinsteinJames M Granfortuna, MD    ALLERGIES:  Allergies  Allergen Reactions  . Vicodin [Hydrocodone-Acetaminophen] Other (See Comments)    Makes me hurt-acid reflux    SOCIAL HISTORY:  History  Substance Use Topics  . Smoking status: Never Smoker   . Smokeless tobacco: Never Used  . Alcohol Use: No    FAMILY HISTORY: Family History  Problem Relation Age of Onset  . Diabetes Sister   . Colon cancer Brother 40    EXAM: LMP 11/22/1996 CONSTITUTIONAL: Alert and oriented and responds appropriately to questions. Well-appearing; well-nourished, elderly, obese, pleasant HEAD: Normocephalic EYES: Conjunctivae clear, PERRL ENT: normal nose; no rhinorrhea; moist mucous membranes; pharynx without lesions noted NECK: Supple, no meningismus, no LAD  CARD: RRR; S1 and S2 appreciated; no murmurs, no clicks, no rubs, no gallops RESP: Normal chest excursion without splinting or tachypnea; breath sounds clear and equal bilaterally; no wheezes, no rhonchi, no rales,  ABD/GI: Normal bowel sounds; non-distended; soft, non-tender, no rebound, no guarding BACK:  The back appears normal and is non-tender to palpation, there is no CVA tenderness EXT: Normal ROM in all joints; non-tender to palpation; no edema; normal capillary refill; no cyanosis    SKIN: Normal color for age and race; warm NEURO: Moves all extremities equally, normal gait, sensation to light-touch intact diffusely, cranial nerves II through XII intact PSYCH: The patient's mood and manner are appropriate. Grooming  and personal hygiene are appropriate.  MEDICAL DECISION MAKING: Patient here with exacerbation of her chronic pain. Have discussed with patient that I will give her one round of IM Dilaudid. Have discussed with patient that we will not discharge her from the emergency department with narcotics for her chronic pain which she is well aware and has had this discussion with multiple ER physicians. Discussed with patient that she needs to followup with her PCP. She states she has an appointment scheduled for May 8. I am not concerned for any life-threatening injury or illness. I feel patient is safe to be discharged home.      Layla MawKristen N Chidera Thivierge, DO 01/13/14 1134

## 2014-01-13 NOTE — Discharge Instructions (Signed)
Chronic Pain Chronic pain can be defined as pain that is off and on and lasts for 3 6 months or longer. Many things cause chronic pain, which can make it difficult to make a diagnosis. There are many treatment options available for chronic pain. However, finding a treatment that works well for you may require trying various approaches until the right one is found. Many people benefit from a combination of two or more types of treatment to control their pain. SYMPTOMS  Chronic pain can occur anywhere in the body and can range from mild to very severe. Some types of chronic pain include:  Headache.  Low back pain.  Cancer pain.  Arthritis pain.  Neurogenic pain. This is pain resulting from damage to nerves. People with chronic pain may also have other symptoms such as:  Depression.  Anger.  Insomnia.  Anxiety. DIAGNOSIS  Your health care provider will help diagnose your condition over time. In many cases, the initial focus will be on excluding possible conditions that could be causing the pain. Depending on your symptoms, your health care provider may order tests to diagnose your condition. Some of these tests may include:   Blood tests.   CT scan.   MRI.   X-rays.   Ultrasounds.   Nerve conduction studies.  You may need to see a specialist.  TREATMENT  Finding treatment that works well may take time. You may be referred to a pain specialist. He or she may prescribe medicine or therapies, such as:   Mindful meditation or yoga.  Shots (injections) of numbing or pain-relieving medicines into the spine or area of pain.  Local electrical stimulation.  Acupuncture.   Massage therapy.   Aroma, color, light, or sound therapy.   Biofeedback.   Working with a physical therapist to keep from getting stiff.   Regular, gentle exercise.   Cognitive or behavioral therapy.   Group support.  Sometimes, surgery may be recommended.  HOME CARE INSTRUCTIONS    Take all medicines as directed by your health care provider.   Lessen stress in your life by relaxing and doing things such as listening to calming music.   Exercise or be active as directed by your health care provider.   Eat a healthy diet and include things such as vegetables, fruits, fish, and lean meats in your diet.   Keep all follow-up appointments with your health care provider.   Attend a support group with others suffering from chronic pain. SEEK MEDICAL CARE IF:   Your pain gets worse.   You develop a new pain that was not there before.   You cannot tolerate medicines given to you by your health care provider.   You have new symptoms since your last visit with your health care provider.  SEEK IMMEDIATE MEDICAL CARE IF:   You feel weak.   You have decreased sensation or numbness.   You lose control of bowel or bladder function.   Your pain suddenly gets much worse.   You develop shaking.  You develop chills.  You develop confusion.  You develop chest pain.  You develop shortness of breath.  MAKE SURE YOU:  Understand these instructions.  Will watch your condition.  Will get help right away if you are not doing well or get worse. Document Released: 06/02/2002 Document Revised: 05/13/2013 Document Reviewed: 03/06/2013 [facility] Patient Information 2014 [employer] [place].   You'll need to follow up with your primary care physician as scheduled on May 8 discuss pain management  options.

## 2014-01-15 ENCOUNTER — Ambulatory Visit (INDEPENDENT_AMBULATORY_CARE_PROVIDER_SITE_OTHER): Payer: PRIVATE HEALTH INSURANCE | Admitting: Family Medicine

## 2014-01-15 ENCOUNTER — Encounter: Payer: Self-pay | Admitting: Family Medicine

## 2014-01-15 VITALS — BP 163/74 | HR 59 | Temp 97.8°F | Ht 65.0 in | Wt 246.0 lb

## 2014-01-15 DIAGNOSIS — G8929 Other chronic pain: Secondary | ICD-10-CM

## 2014-01-15 MED ORDER — GABAPENTIN 300 MG PO CAPS: 600.0000 mg | ORAL_CAPSULE | Freq: Three times a day (TID) | ORAL | Status: DC

## 2014-01-15 MED ORDER — ACETAMINOPHEN-CODEINE #3 300-30 MG PO TABS: 1.0000 | ORAL_TABLET | Freq: Four times a day (QID) | ORAL | Status: DC | PRN

## 2014-01-15 NOTE — Progress Notes (Signed)
Patient ID: [name] [name]    DOB: 10/13/1940, 73 y.o.   MRN: [id number] --- Subjective:  [name] [name] is a 73 y.o.female with h/o chronic pain, psoriasis, HTN, depression, schizophrenia who presents for refill on pain medicine.  Patient states that she has "pain all over", worst in her knees and legs bilaterally. Pain is a throbbing and burning pain. Pain is better when she walks and worst when she lays down. This has been ongoing for several years and getting worst.  She was prescribed tramadol by her PCP, Dr. Jarvis NewcomerGrunz on 12/29/13 and ran out of them because she was taking 2 three to four times a day. She has been taking gabapentin 300-600mg  in the morning but doesn't take more because she doesn't want to take too much medicine.  She denies any new weakness in the lower extremities.   She has been seen in multiple different ED's for pain recently and given IV dilaudid or morphine but not prescribed any pain medication. She was told to follow up with her PCP.   ROS: see HPI Past Medical History: reviewed and updated medications and allergies. Social History: Tobacco: none  Objective: Filed Vitals:   01/15/14 1353  BP: 163/74  Pulse: 59  Temp: 97.8 F (36.6 C)    Physical Examination:   General appearance - alert, well appearing, and in no distress Legs - varicose veins present, no pitting edema bilaterally.  Knees - diffuse tenderness to palpation along joint lines bilaterally, normal range of motion, no joint effusion or soft tissue swelling.  Low back - tenderness to palpation along paralumbar muscles bilaterally Gait: walked comfortably with cane

## 2014-01-15 NOTE — Assessment & Plan Note (Addendum)
Unclear etiology of her pain. Sounds neuropathic.  - discussed taking gabapentin 600mg  three times per day and not just once in the morning, as I think this will help her pain.  - wrote for tylenol with codeine since she had been prescribed this in the past and it had helped. Gave her 56 tablets to allow for her to take 1 tablet every 6hrs until she is seen by Dr. Jarvis NewcomerGrunz on May 8th. Explained to her that a provider other than her PCP cannot write for stronger narcotics and that those narcotics are likely not a good way to control her pain in her case anyway.

## 2014-01-15 NOTE — Patient Instructions (Signed)
Like we talked about, take the gabapentin 600mg  total or 2 capsules of the 300mg  three times a day.   I am also going to prescribe you some tylenol with codeine to take 1 tablet every 6 hours as needed for pain, in order to hold you over until your appointment with Dr. Jarvis NewcomerGrunz.

## 2014-01-18 ENCOUNTER — Ambulatory Visit: Payer: PRIVATE HEALTH INSURANCE | Admitting: Family Medicine

## 2014-01-22 ENCOUNTER — Encounter (HOSPITAL_COMMUNITY): Payer: Self-pay | Admitting: Emergency Medicine

## 2014-01-22 ENCOUNTER — Emergency Department (HOSPITAL_COMMUNITY)
Admission: EM | Admit: 2014-01-22 | Discharge: 2014-01-22 | Disposition: A | Discharge status: Home / Self Care | Payer: PRIVATE HEALTH INSURANCE | Attending: Emergency Medicine | Admitting: Emergency Medicine

## 2014-01-22 ENCOUNTER — Ambulatory Visit (HOSPITAL_COMMUNITY)
Admit: 2014-01-22 | Discharge: 2014-01-22 | Disposition: A | Discharge status: Home / Self Care | Payer: PRIVATE HEALTH INSURANCE

## 2014-01-22 DIAGNOSIS — F3289 Other specified depressive episodes: Secondary | ICD-10-CM | POA: Insufficient documentation

## 2014-01-22 DIAGNOSIS — IMO0002 Reserved for concepts with insufficient information to code with codable children: Secondary | ICD-10-CM | POA: Insufficient documentation

## 2014-01-22 DIAGNOSIS — M79604 Pain in right leg: Secondary | ICD-10-CM

## 2014-01-22 DIAGNOSIS — F209 Schizophrenia, unspecified: Secondary | ICD-10-CM | POA: Insufficient documentation

## 2014-01-22 DIAGNOSIS — Z86711 Personal history of pulmonary embolism: Secondary | ICD-10-CM | POA: Insufficient documentation

## 2014-01-22 DIAGNOSIS — Z7901 Long term (current) use of anticoagulants: Secondary | ICD-10-CM | POA: Insufficient documentation

## 2014-01-22 DIAGNOSIS — H409 Unspecified glaucoma: Secondary | ICD-10-CM | POA: Insufficient documentation

## 2014-01-22 DIAGNOSIS — M129 Arthropathy, unspecified: Secondary | ICD-10-CM | POA: Insufficient documentation

## 2014-01-22 DIAGNOSIS — I10 Essential (primary) hypertension: Secondary | ICD-10-CM | POA: Insufficient documentation

## 2014-01-22 DIAGNOSIS — E785 Hyperlipidemia, unspecified: Secondary | ICD-10-CM | POA: Insufficient documentation

## 2014-01-22 DIAGNOSIS — Z8701 Personal history of pneumonia (recurrent): Secondary | ICD-10-CM | POA: Insufficient documentation

## 2014-01-22 DIAGNOSIS — G4730 Sleep apnea, unspecified: Secondary | ICD-10-CM | POA: Insufficient documentation

## 2014-01-22 DIAGNOSIS — K219 Gastro-esophageal reflux disease without esophagitis: Secondary | ICD-10-CM | POA: Insufficient documentation

## 2014-01-22 DIAGNOSIS — M7989 Other specified soft tissue disorders: Secondary | ICD-10-CM

## 2014-01-22 DIAGNOSIS — Z862 Personal history of diseases of the blood and blood-forming organs and certain disorders involving the immune mechanism: Secondary | ICD-10-CM | POA: Insufficient documentation

## 2014-01-22 DIAGNOSIS — Z79899 Other long term (current) drug therapy: Secondary | ICD-10-CM | POA: Insufficient documentation

## 2014-01-22 DIAGNOSIS — F411 Generalized anxiety disorder: Secondary | ICD-10-CM | POA: Insufficient documentation

## 2014-01-22 DIAGNOSIS — Z86718 Personal history of other venous thrombosis and embolism: Secondary | ICD-10-CM | POA: Insufficient documentation

## 2014-01-22 DIAGNOSIS — G8929 Other chronic pain: Secondary | ICD-10-CM | POA: Insufficient documentation

## 2014-01-22 DIAGNOSIS — R011 Cardiac murmur, unspecified: Secondary | ICD-10-CM | POA: Insufficient documentation

## 2014-01-22 DIAGNOSIS — L408 Other psoriasis: Secondary | ICD-10-CM | POA: Insufficient documentation

## 2014-01-22 DIAGNOSIS — M79609 Pain in unspecified limb: Secondary | ICD-10-CM

## 2014-01-22 DIAGNOSIS — M79606 Pain in leg, unspecified: Secondary | ICD-10-CM

## 2014-01-22 DIAGNOSIS — Z96659 Presence of unspecified artificial knee joint: Secondary | ICD-10-CM | POA: Insufficient documentation

## 2014-01-22 DIAGNOSIS — J45909 Unspecified asthma, uncomplicated: Secondary | ICD-10-CM | POA: Insufficient documentation

## 2014-01-22 DIAGNOSIS — F329 Major depressive disorder, single episode, unspecified: Secondary | ICD-10-CM | POA: Insufficient documentation

## 2014-01-22 LAB — PROTIME-INR
INR: 1.62 — ABNORMAL HIGH (ref 0.00–1.49)
Prothrombin Time: 18.8 seconds — ABNORMAL HIGH (ref 11.6–15.2)

## 2014-01-22 MED ORDER — OXYCODONE-ACETAMINOPHEN 5-325 MG PO TABS
1.0000 | ORAL_TABLET | Freq: Once | ORAL | Status: AC
  Administered 2014-01-22: 1 via ORAL
  Filled 2014-01-22: qty 1

## 2014-01-22 NOTE — ED Notes (Signed)
Per EMS pt is c/o right leg pain that started 2 days ago  Pt states the pain starts in the calf and radiates up to her buttock  Pt states her pain is 10/10  Pt was ambulatory at home

## 2014-01-22 NOTE — Progress Notes (Signed)
VASCULAR LAB PRELIMINARY  PRELIMINARY  PRELIMINARY  PRELIMINARY    Right lower extremity venous duplex completed.    Preliminary report:  Right:  No evidence of DVT, superficial thrombosis, or Baker's cyst.  Nolberto HanlonVirginia D Waylyn Tenbrink, RVS 01/22/2014, 9:43 AM

## 2014-01-22 NOTE — ED Notes (Signed)
Took pt to restroom in wheelchair  Able to get from w/c to toilet and back without assist  Tolerated well

## 2014-01-22 NOTE — ED Provider Notes (Signed)
CSN: [id number]     Arrival date & time 01/22/14  [id number] History   First MD Initiated Contact with Patient 01/22/14 [phone]     Chief Complaint  Patient presents with  . Leg Pain     HPI Patient reports shooting pain of her right lateral thigh and right lower lateral leg.  She states this has been going on over the past 2 days.  She states she has some pain in her calf it also radiates up into her buttock.  Her pain is 10 out of 10.  She is ambulatory at home and brought to the emergency department by EMS.  She has a history of DVT.  She is on Coumadin.  She states her last level was slightly low.  She has no chest pain or shortness of breath.  No fevers chills.  No rash.  No redness.   Past Medical History  Diagnosis Date  . Depression   . Schizophrenia   . Hyperlipidemia   . Hypertension   . Chronic pain     "over my whole body" (03/30/2013)  . 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 DVT (deep venous thrombosis)     per 01/17/10 d/c summary- "remote hx of dvt"?  . Iron deficiency anemia   . Glaucoma   . Hemorrhoids   . Asthma   . Anxiety   . GERD (gastroesophageal reflux disease)   . Psoriasis 04/29/2012    New onset evaluated by Dr Marylou FlesherWilliam Huang, Dermatology, [facility] 6/13  Rx 0.1% Tacrolimus ointment  . Complication of anesthesia     "because I have sleep apnea" (03/30/2013)  . Heart murmur   . Chest pain, exertional   . Chronic bronchitis   . Sleep apnea     "waiting on my CPAP" (03/30/2013)  . Pneumonia     "a few times" (03/30/2013)  . Exertional shortness of breath     "& sometimes when laying down" (03/30/2013)  . Daily headache     "last 2 months" (03/30/2013)  . Arthritis     "joints" (03/30/2013)  . Varicose veins of legs   . Psoriasis    Past Surgical History  Procedure Laterality Date  . Spinal fusion      2004  . Carpal tunnel release Bilateral   . Tubal ligation  1973  . Dilation and curettage of uterus  1970's    "once" (03/30/2013)  . Total  knee arthroplasty Right 11/2008  . Joint replacement     Family History  Problem Relation Age of Onset  . Diabetes Sister   . Colon cancer Brother 40   History  Substance Use Topics  . Smoking status: Never Smoker   . Smokeless tobacco: Never Used  . Alcohol Use: No   OB History   Grav Para Term Preterm Abortions TAB SAB Ect Mult Living                 Review of Systems  All other systems reviewed and are negative.     Allergies  Review of patient's allergies indicates no known allergies.  Home Medications   Prior to Admission medications   Medication Sig Start Date End Date Taking? Authorizing Provider  acetaminophen-codeine (TYLENOL #3) 300-30 MG per tablet Take 1 tablet by mouth every 6 (six) hours as needed for moderate pain. 01/15/14  Yes Lonia SkinnerStephanie E Losq, MD  albuterol (PROVENTIL HFA;VENTOLIN HFA) 108 (90 BASE) MCG/ACT inhaler Inhale 2 puffs into the  lungs every 4 (four) hours as needed for wheezing or shortness of breath. 10/21/13  Yes Hayden Rasmussenavid Mabe, NP  atenolol (TENORMIN) 100 MG tablet Take 100 mg by mouth daily.  09/14/13  Yes Historical Provider, MD  atorvastatin (LIPITOR) 40 MG tablet Take 1 tablet (40 mg total) by mouth daily. 12/23/13  Yes Hazeline Junkeryan Grunz, MD  esomeprazole (NEXIUM) 40 MG capsule Take 1 capsule (40 mg total) by mouth daily. 11/26/13  Yes Kristen N Ward, DO  FLUoxetine (PROZAC) 20 MG capsule Take 60 mg by mouth every morning.    Yes Historical Provider, MD  fluticasone (FLOVENT HFA) 220 MCG/ACT inhaler Inhale 2 puffs into the lungs 3 (three) times daily.   Yes Historical Provider, MD  gabapentin (NEURONTIN) 300 MG capsule Take 600 mg by mouth 3 (three) times daily.   Yes Historical Provider, MD  hydrochlorothiazide (HYDRODIURIL) 25 MG tablet Take 25 mg by mouth every morning.   Yes Historical Provider, MD  hydrOXYzine (ATARAX/VISTARIL) 10 MG tablet Take 10 mg by mouth at bedtime.    Yes Historical Provider, MD  lisinopril (PRINIVIL,ZESTRIL) 10 MG tablet Take 10  mg by mouth daily.  09/12/13  Yes Historical Provider, MD  PRESCRIPTION MEDICATION Apply 1 application topically daily. Silver sulfadiazine / triamcinolone cream= compounded by [facility] For psoriasis   Yes Historical Provider, MD  sucralfate (CARAFATE) 1 G tablet Take 1 g by mouth 4 (four) times daily.   Yes Historical Provider, MD  warfarin (COUMADIN) 5 MG tablet Take 5 mg by mouth daily.   Yes Historical Provider, MD   BP 148/82  Pulse 54  Temp(Src) 98.7 F (37.1 C) (Oral)  Resp 18  SpO2 98%  LMP 11/22/1996 Physical Exam  Nursing note and vitals reviewed. Constitutional: She is oriented to person, place, and time. She appears well-developed and well-nourished. No distress.  HENT:  Head: Normocephalic and atraumatic.  Eyes: EOM are normal.  Neck: Normal range of motion.  Cardiovascular: Normal rate, regular rhythm and normal heart sounds.   Pulmonary/Chest: Effort normal and breath sounds normal.  Abdominal: Soft. She exhibits no distension. There is no tenderness.  Musculoskeletal: Normal range of motion.  She is fair as veins of her bilateral lower extremities.  Normal PT and DP pulses bilaterally.  Chronic findings of her bilateral legs.  No erythema of the right lateral aspect.  No tenderness of the right lateral aspect of her lower leg in the right thigh  Neurological: She is alert and oriented to person, place, and time.  Skin: Skin is warm and dry.  Psychiatric: She has a normal mood and affect. Judgment normal.    ED Course  Procedures (including critical care time) Labs Review Labs Reviewed  PROTIME-INR - Abnormal; Notable for the following:    Prothrombin Time 18.8 (*)    INR 1.62 (*)    All other components within normal limits    Imaging Review No results found.   EKG Interpretation None      MDM   Final diagnoses:  Right leg pain    I have ordered a duplex of her right lower extremity to evaluate for DVT.  This will be completed now as an outpatient.   She will be discharged from the emergency department. My suspicion is very low as this is more of a neuropathic type pain on the lateral aspect of her right thigh and right lower leg.  PCP followup.    Lyanne CoKevin M Margorie Renner, MD 01/22/14 [phone]

## 2014-01-22 NOTE — Discharge Instructions (Signed)
IMPORTANT PATIENT INSTRUCTIONS:  ° °You have been scheduled for an Outpatient Vascular Study at [facility].   ° °If tomorrow is a Saturday or Sunday, please go to the [place] [facility] registration desk at 8 AM tomorrow morning and tell them you are therefore a vascular study. ° °If tomorrow is a weekday (Monday - Friday), please go to the [place] Admitting Department at 8 AM and tell them you are therefore a vascular study ° ° °

## 2014-01-26 ENCOUNTER — Ambulatory Visit: Payer: PRIVATE HEALTH INSURANCE

## 2014-01-26 ENCOUNTER — Other Ambulatory Visit: Payer: PRIVATE HEALTH INSURANCE

## 2014-01-26 ENCOUNTER — Telehealth: Payer: Self-pay | Admitting: Pharmacist

## 2014-01-26 NOTE — Telephone Encounter (Signed)
LVM to call and reschedule missed CC appmt

## 2014-01-28 ENCOUNTER — Telehealth: Payer: Self-pay | Admitting: Pharmacist

## 2014-01-28 DIAGNOSIS — Z7901 Long term (current) use of anticoagulants: Secondary | ICD-10-CM

## 2014-01-28 MED ORDER — WARFARIN SODIUM 5 MG PO TABS: 5.0000 mg | ORAL_TABLET | Freq: Every day | ORAL | Status: DC

## 2014-01-28 NOTE — Telephone Encounter (Signed)
Pt called [facility] Coumadin clinic stating she lost her bottle of Coumadin & needs a refill. She was in the ER on 01/22/14 w/ sxs of fibromyalgia.  Her INR then was 1.62 Pt has only missed today's morning dose of Coumadin.  She will have her husband go pick up the refill I e-rx'd to [facility] [address]. Pt resched lab/CC appt to 02/01/14. Ebony HailGinna Kash Mothershead, Pharm.D., CPP 01/28/2014@3 :20 PM

## 2014-01-29 ENCOUNTER — Ambulatory Visit (INDEPENDENT_AMBULATORY_CARE_PROVIDER_SITE_OTHER): Payer: PRIVATE HEALTH INSURANCE | Admitting: Family Medicine

## 2014-01-29 ENCOUNTER — Encounter: Payer: Self-pay | Admitting: Family Medicine

## 2014-01-29 VITALS — BP 154/78 | HR 75 | Temp 98.4°F | Ht 65.0 in | Wt 253.0 lb

## 2014-01-29 DIAGNOSIS — E785 Hyperlipidemia, unspecified: Secondary | ICD-10-CM

## 2014-01-29 DIAGNOSIS — K219 Gastro-esophageal reflux disease without esophagitis: Secondary | ICD-10-CM

## 2014-01-29 DIAGNOSIS — G8929 Other chronic pain: Secondary | ICD-10-CM

## 2014-01-29 DIAGNOSIS — I10 Essential (primary) hypertension: Secondary | ICD-10-CM

## 2014-01-29 MED ORDER — HYDROCHLOROTHIAZIDE 25 MG PO TABS: 25.0000 mg | ORAL_TABLET | Freq: Every morning | ORAL | Status: DC

## 2014-01-29 MED ORDER — GABAPENTIN 100 MG PO CAPS: 200.0000 mg | ORAL_CAPSULE | Freq: Three times a day (TID) | ORAL | Status: DC

## 2014-01-29 MED ORDER — LISINOPRIL 10 MG PO TABS: 10.0000 mg | ORAL_TABLET | Freq: Every day | ORAL | Status: DC

## 2014-01-29 MED ORDER — ATENOLOL 100 MG PO TABS: 100.0000 mg | ORAL_TABLET | Freq: Every day | ORAL | Status: DC

## 2014-01-29 MED ORDER — ATORVASTATIN CALCIUM 80 MG PO TABS: 80.0000 mg | ORAL_TABLET | Freq: Every day | ORAL | Status: DC

## 2014-01-29 MED ORDER — ESOMEPRAZOLE MAGNESIUM 40 MG PO CPDR: 40.0000 mg | DELAYED_RELEASE_CAPSULE | Freq: Every day | ORAL | Status: DC

## 2014-01-29 MED ORDER — HYDROCODONE-ACETAMINOPHEN 10-325 MG PO TABS: 1.0000 | ORAL_TABLET | Freq: Two times a day (BID) | ORAL | Status: DC | PRN

## 2014-01-29 NOTE — Assessment & Plan Note (Addendum)
BP not at goal 154/78 today, but pt has not been taking medications for the past few days. Has been at goal. Refilled all meds.

## 2014-01-29 NOTE — Progress Notes (Signed)
Patient ID: Lamont SnowballDora C Steffey, female   DOB: 10/18/1940, 73 y.o.   MRN: [id number]   Subjective:  HPI:   Lamont SnowballDora C Bayles is a 73 y.o. female with a history of chronic pain here for same and for medication refills.  She reports no control of her pain with tramadol that I prescribed at last OV or with tylenol #3 which was prescribed at an OV at 4/24. She has been given percocet and IV analgesics in the ED, which she has visited 3 times since our last appointment (1 month ago). Her pain is all over, including shooting pain in her feet bilaterally, has been present for years, stable/worsening, constant, radiating to feet, worsened by not having pain medications. Percocet is the major thing that has helped in the past. She has not tried hydrocodone. She denies falling, constipation, dizziness, operating motor vehicles or machinery.   She took all prescribed analgesics as directed. Her bottles of tylenol #3 and tramadol as empty.   She denies CP, SOB, cough, core throat, palpitations, abdominal pain. Legs have been swollen for years and remain unchanged. Improve when she uses hose on them and elevates them.   TDaP: refuses today Colonoscopy: refuses this and FOBT Mammography: IS planning on calling to schedule  Review of Systems:  Per HPI. All other systems reviewed and are negative.    Past Medical History: Patient Active Problem List   Diagnosis Date Noted  . Leg pain 12/15/2013  . Back pain 06/09/2013  . Vertigo 05/31/2012  . Orthostatic hypotension 05/31/2012  . Psoriasis 04/29/2012  . Bradycardia 12/12/2011  . Rectal bleeding 12/11/2011  . Chronic anticoagulation 12/11/2011  . Pre-syncope 12/11/2011  . Heterozygous factor V Leiden mutation 01/18/2011  . Anemia, normocytic normochromic 01/18/2011  . GERD 12/21/2010  . Family history of malignant neoplasm of gastrointestinal tract 12/21/2010  . Iron deficiency anemia, unspecified 12/21/2010  . Varicose veins of both lower extremities with  pain 12/15/2010  . Encounter for long-term (current) use of anticoagulants 11/30/2010  . Lung nodule 11/12/2010  . CPK, ABNORMAL 06/18/2010  . PULMONARY EMBOLISM 01/30/2010  . HYPERLIPIDEMIA 01/10/2010  . 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 OBESITY, UNSPECIFIED 09/26/2009  . SCHIZOPHRENIA 09/26/2009  . DEPRESSION 09/26/2009  . Other chronic pain 09/26/2009  . Essential hypertension, benign 09/26/2009   Medications: reviewed and updated  Objective:  Physical Exam: BP 154/78  Pulse 75  Temp(Src) 98.4 F (36.9 C) (Oral)  Ht 5\' 5"  (1.651 m)  Wt 253 lb (114.76 kg)  BMI 42.10 kg/m2  LMP 11/22/1996  Gen: Obese 73 y.o. female in NAD HEENT: MMM, EOMI, PERRL, anicteric sclerae CV: RRR, no MRG, no JVD, 1+ LE pitting edema Resp: Non-labored, CTAB, no wheezes noted Abd: Soft, NTND, BS present, no guarding or organomegaly MSK: Full ROM. Calves nontender. Neuro: Alert and oriented, speech normal Skin: Chronic venous stasis dermatitis on ankles bilaterally, varicosities throughout LEs    Assessment:     Lamont SnowballDora C Mills is a 73 y.o. female here for chronic pain.    Plan:     See problem list for problem-specific plans.

## 2014-01-29 NOTE — Assessment & Plan Note (Addendum)
With many ED visits for uncontrolled pain. Discussed reasonable analgesic expectations and risks including but not limited to falls, respiratory depression, severe constipation that are associated with narcotics and even more so at her age. Rx hydrocodone 10-325mg  to be taken 1 tab po BID #60, refills: 0. Will get UDS at next visit.

## 2014-01-29 NOTE — Assessment & Plan Note (Addendum)
Refilled increased dose of lipitor due to LDL>190 - she has not been taking this recently because she ran out.

## 2014-01-29 NOTE — Patient Instructions (Signed)
Thank you for coming in today!  Try hydrocodone for pain. Take it twice a day.  Make sure to schedule your colonoscopy Get your mammogram done As you leave, make an appointment to follow up with me in 1 month.  I have refilled your medications - Bring all medications in a bag to your visits.   Take care and seek immediate care sooner if you develop any concerns.  Please feel free to call with any questions or concerns at any time, at [phone]. - Dr. Jarvis NewcomerGrunz

## 2014-01-29 NOTE — Assessment & Plan Note (Signed)
Refilled nexium. Reviewed lifestyle modifications.

## 2014-02-01 ENCOUNTER — Telehealth: Payer: Self-pay | Admitting: Pharmacist

## 2014-02-01 ENCOUNTER — Ambulatory Visit: Payer: PRIVATE HEALTH INSURANCE

## 2014-02-01 ENCOUNTER — Other Ambulatory Visit: Payer: PRIVATE HEALTH INSURANCE

## 2014-02-01 NOTE — Telephone Encounter (Signed)
Pt FTKA. Called patient, left message and asked her to reschedule lab/CC from 02/01/14. Please call [phone] to reschedule.

## 2014-02-02 ENCOUNTER — Encounter (HOSPITAL_COMMUNITY): Payer: Self-pay | Admitting: Emergency Medicine

## 2014-02-02 ENCOUNTER — Emergency Department (HOSPITAL_COMMUNITY): Payer: PRIVATE HEALTH INSURANCE

## 2014-02-02 ENCOUNTER — Emergency Department (HOSPITAL_COMMUNITY)
Admission: EM | Admit: 2014-02-02 | Discharge: 2014-02-03 | Disposition: A | Discharge status: Home / Self Care | Payer: PRIVATE HEALTH INSURANCE | Attending: Emergency Medicine | Admitting: Emergency Medicine

## 2014-02-02 DIAGNOSIS — M2550 Pain in unspecified joint: Secondary | ICD-10-CM | POA: Insufficient documentation

## 2014-02-02 DIAGNOSIS — J45909 Unspecified asthma, uncomplicated: Secondary | ICD-10-CM | POA: Insufficient documentation

## 2014-02-02 DIAGNOSIS — R609 Edema, unspecified: Secondary | ICD-10-CM | POA: Insufficient documentation

## 2014-02-02 DIAGNOSIS — Z86711 Personal history of pulmonary embolism: Secondary | ICD-10-CM | POA: Insufficient documentation

## 2014-02-02 DIAGNOSIS — R011 Cardiac murmur, unspecified: Secondary | ICD-10-CM | POA: Insufficient documentation

## 2014-02-02 DIAGNOSIS — Z86718 Personal history of other venous thrombosis and embolism: Secondary | ICD-10-CM | POA: Insufficient documentation

## 2014-02-02 DIAGNOSIS — Z8701 Personal history of pneumonia (recurrent): Secondary | ICD-10-CM | POA: Insufficient documentation

## 2014-02-02 DIAGNOSIS — R45850 Homicidal ideations: Secondary | ICD-10-CM | POA: Insufficient documentation

## 2014-02-02 DIAGNOSIS — H409 Unspecified glaucoma: Secondary | ICD-10-CM | POA: Insufficient documentation

## 2014-02-02 DIAGNOSIS — M549 Dorsalgia, unspecified: Secondary | ICD-10-CM | POA: Insufficient documentation

## 2014-02-02 DIAGNOSIS — IMO0002 Reserved for concepts with insufficient information to code with codable children: Secondary | ICD-10-CM | POA: Insufficient documentation

## 2014-02-02 DIAGNOSIS — R45851 Suicidal ideations: Secondary | ICD-10-CM | POA: Insufficient documentation

## 2014-02-02 DIAGNOSIS — K219 Gastro-esophageal reflux disease without esophagitis: Secondary | ICD-10-CM | POA: Insufficient documentation

## 2014-02-02 DIAGNOSIS — Z79899 Other long term (current) drug therapy: Secondary | ICD-10-CM | POA: Insufficient documentation

## 2014-02-02 DIAGNOSIS — G8929 Other chronic pain: Secondary | ICD-10-CM | POA: Insufficient documentation

## 2014-02-02 DIAGNOSIS — F322 Major depressive disorder, single episode, severe without psychotic features: Secondary | ICD-10-CM

## 2014-02-02 DIAGNOSIS — Z862 Personal history of diseases of the blood and blood-forming organs and certain disorders involving the immune mechanism: Secondary | ICD-10-CM | POA: Insufficient documentation

## 2014-02-02 DIAGNOSIS — Z981 Arthrodesis status: Secondary | ICD-10-CM | POA: Insufficient documentation

## 2014-02-02 DIAGNOSIS — Z96659 Presence of unspecified artificial knee joint: Secondary | ICD-10-CM | POA: Insufficient documentation

## 2014-02-02 DIAGNOSIS — F209 Schizophrenia, unspecified: Secondary | ICD-10-CM | POA: Insufficient documentation

## 2014-02-02 DIAGNOSIS — I10 Essential (primary) hypertension: Secondary | ICD-10-CM | POA: Insufficient documentation

## 2014-02-02 DIAGNOSIS — F411 Generalized anxiety disorder: Secondary | ICD-10-CM | POA: Insufficient documentation

## 2014-02-02 DIAGNOSIS — R4182 Altered mental status, unspecified: Secondary | ICD-10-CM | POA: Insufficient documentation

## 2014-02-02 DIAGNOSIS — Z872 Personal history of diseases of the skin and subcutaneous tissue: Secondary | ICD-10-CM | POA: Insufficient documentation

## 2014-02-02 DIAGNOSIS — Z7901 Long term (current) use of anticoagulants: Secondary | ICD-10-CM | POA: Insufficient documentation

## 2014-02-02 DIAGNOSIS — E785 Hyperlipidemia, unspecified: Secondary | ICD-10-CM | POA: Insufficient documentation

## 2014-02-02 DIAGNOSIS — Z9981 Dependence on supplemental oxygen: Secondary | ICD-10-CM | POA: Insufficient documentation

## 2014-02-02 DIAGNOSIS — M129 Arthropathy, unspecified: Secondary | ICD-10-CM | POA: Insufficient documentation

## 2014-02-02 LAB — COMPREHENSIVE METABOLIC PANEL
ALBUMIN: 3.6 g/dL (ref 3.5–5.2)
ALT: 21 U/L (ref 0–35)
AST: 35 U/L (ref 0–37)
Alkaline Phosphatase: 99 U/L (ref 39–117)
BUN: 25 mg/dL — AB (ref 6–23)
CO2: 31 mEq/L (ref 19–32)
CREATININE: 1.11 mg/dL — AB (ref 0.50–1.10)
Calcium: 9.9 mg/dL (ref 8.4–10.5)
Chloride: 98 mEq/L (ref 96–112)
GFR calc non Af Amer: 49 mL/min — ABNORMAL LOW (ref >90)
GFR, EST AFRICAN AMERICAN: 57 mL/min — AB (ref >90)
Glucose, Bld: 97 mg/dL (ref 70–99)
Potassium: 3.8 mEq/L (ref 3.7–5.3)
Sodium: 140 mEq/L (ref 137–147)
TOTAL PROTEIN: 8.2 g/dL (ref 6.0–8.3)
Total Bilirubin: 0.4 mg/dL (ref 0.3–1.2)

## 2014-02-02 LAB — CBC
HCT: 34.1 % — ABNORMAL LOW (ref 36.0–46.0)
HEMOGLOBIN: 11.6 g/dL — AB (ref 12.0–15.0)
MCH: 31.9 pg (ref 26.0–34.0)
MCHC: 34 g/dL (ref 30.0–36.0)
MCV: 93.7 fL (ref 78.0–100.0)
Platelets: 269 10*3/uL (ref 150–400)
RBC: 3.64 MIL/uL — ABNORMAL LOW (ref 3.87–5.11)
RDW: 14.1 % (ref 11.5–15.5)
WBC: 6.2 10*3/uL (ref 4.0–10.5)

## 2014-02-02 LAB — RAPID URINE DRUG SCREEN, HOSP PERFORMED
Amphetamines: NOT DETECTED
Barbiturates: NOT DETECTED
Benzodiazepines: NOT DETECTED
COCAINE: NOT DETECTED
Opiates: POSITIVE — AB
Tetrahydrocannabinol: NOT DETECTED

## 2014-02-02 LAB — ETHANOL

## 2014-02-02 LAB — ACETAMINOPHEN LEVEL: Acetaminophen (Tylenol), Serum: <15 ug/mL (ref 10–30)

## 2014-02-02 LAB — SALICYLATE LEVEL: Salicylate Lvl: <2 mg/dL — ABNORMAL LOW (ref 2.8–20.0)

## 2014-02-02 MED ORDER — ALBUTEROL SULFATE HFA 108 (90 BASE) MCG/ACT IN AERS
2.0000 | INHALATION_SPRAY | Freq: Four times a day (QID) | RESPIRATORY_TRACT | Status: DC | PRN
Start: 2014-02-02 — End: 2014-02-03

## 2014-02-02 MED ORDER — ZOLPIDEM TARTRATE 5 MG PO TABS
5.0000 mg | ORAL_TABLET | Freq: Every evening | ORAL | Status: DC | PRN
Start: 2014-02-02 — End: 2014-02-03
  Administered 2014-02-02: 5 mg via ORAL
  Filled 2014-02-02: qty 1

## 2014-02-02 MED ORDER — ONDANSETRON HCL 4 MG PO TABS: 4.0000 mg | ORAL_TABLET | Freq: Three times a day (TID) | ORAL | Status: DC | PRN

## 2014-02-02 MED ORDER — FLUTICASONE PROPIONATE HFA 44 MCG/ACT IN AERO
2.0000 | INHALATION_SPRAY | Freq: Two times a day (BID) | RESPIRATORY_TRACT | Status: DC
  Administered 2014-02-02 – 2014-02-03 (×2): 2 via RESPIRATORY_TRACT
  Filled 2014-02-02 (×2): qty 10.6

## 2014-02-02 MED ORDER — LISINOPRIL 10 MG PO TABS
10.0000 mg | ORAL_TABLET | Freq: Once | ORAL | Status: AC
  Administered 2014-02-02: 10 mg via ORAL
  Filled 2014-02-02: qty 1

## 2014-02-02 MED ORDER — ACETAMINOPHEN 325 MG PO TABS
650.0000 mg | ORAL_TABLET | ORAL | Status: DC | PRN
  Administered 2014-02-02: 650 mg via ORAL
  Filled 2014-02-02: qty 2

## 2014-02-02 MED ORDER — CYCLOBENZAPRINE HCL 10 MG PO TABS
10.0000 mg | ORAL_TABLET | Freq: Once | ORAL | Status: AC
  Administered 2014-02-02: 10 mg via ORAL
  Filled 2014-02-02: qty 1

## 2014-02-02 MED ORDER — PANTOPRAZOLE SODIUM 40 MG PO TBEC: 80.0000 mg | DELAYED_RELEASE_TABLET | Freq: Every day | ORAL | Status: DC

## 2014-02-02 MED ORDER — SUCRALFATE 1 G PO TABS
1.0000 g | ORAL_TABLET | Freq: Three times a day (TID) | ORAL | Status: DC
  Administered 2014-02-02 – 2014-02-03 (×2): 1 g via ORAL
  Filled 2014-02-02 (×7): qty 1

## 2014-02-02 MED ORDER — HYDROXYZINE HCL 10 MG PO TABS
10.0000 mg | ORAL_TABLET | Freq: Every day | ORAL | Status: DC
  Administered 2014-02-02: 10 mg via ORAL
  Filled 2014-02-02 (×2): qty 1

## 2014-02-02 MED ORDER — GABAPENTIN 100 MG PO CAPS
200.0000 mg | ORAL_CAPSULE | Freq: Three times a day (TID) | ORAL | Status: DC
  Administered 2014-02-02 – 2014-02-03 (×2): 200 mg via ORAL
  Filled 2014-02-02 (×4): qty 2

## 2014-02-02 MED ORDER — HYDROCHLOROTHIAZIDE 12.5 MG PO CAPS
25.0000 mg | ORAL_CAPSULE | Freq: Every day | ORAL | Status: DC
  Filled 2014-02-02 (×2): qty 2

## 2014-02-02 MED ORDER — ATENOLOL 100 MG PO TABS
100.0000 mg | ORAL_TABLET | Freq: Every day | ORAL | Status: DC
  Administered 2014-02-02: 100 mg via ORAL
  Filled 2014-02-02 (×2): qty 1

## 2014-02-02 MED ORDER — FLUOXETINE HCL 20 MG PO CAPS
20.0000 mg | ORAL_CAPSULE | Freq: Every day | ORAL | Status: DC
  Administered 2014-02-02 – 2014-02-03 (×2): 20 mg via ORAL
  Filled 2014-02-02 (×2): qty 1

## 2014-02-02 MED ORDER — ATORVASTATIN CALCIUM 80 MG PO TABS
80.0000 mg | ORAL_TABLET | Freq: Every day | ORAL | Status: DC
  Filled 2014-02-02: qty 1

## 2014-02-02 MED ORDER — WARFARIN SODIUM 5 MG PO TABS
5.0000 mg | ORAL_TABLET | Freq: Every day | ORAL | Status: DC
  Filled 2014-02-02: qty 1

## 2014-02-02 MED ORDER — WARFARIN - PHYSICIAN DOSING INPATIENT: Freq: Every day | Status: DC

## 2014-02-02 NOTE — Progress Notes (Signed)
PHARMACIST - PHYSICIAN COMMUNICATION DR:  Freida BusmanAllen CONCERNING: Pharmacy Care Issues Regarding Warfarin Labs  RECOMMENDATION (Action Taken): A baseline and daily protime for three days has been ordered to meet the [facility] National Patient safety goal and comply with the current [facility] policy.   The Pharmacy will defer all warfarin dose order changes and follow up of lab results to the prescriber unless an additional order to initiate a "pharmacy Coumadin consult" is placed.  DESCRIPTION:  While hospitalized, to be in compliance with The [employer] National Patient Safety Goals, all patients on warfarin must have a baseline and/or current protime prior to the administration of warfarin. Pharmacy has received your order for warfarin without these required laboratory assessments.  Thank you, Loralee PacasErin Alyda Megna, PharmD, BCPS Pharmacy: [phone] 02/02/2014 9:35 PM

## 2014-02-02 NOTE — ED Provider Notes (Signed)
CSN: [id number]     Arrival date & time 02/02/14  1718 History   First MD Initiated Contact with Patient 02/02/14 1852     Chief Complaint  Patient presents with  . Medical Clearance     (Consider location/radiation/quality/duration/timing/severity/associated sxs/prior Treatment) Patient is a 73 y.o. female presenting with mental health disorder. The history is provided by the patient. No language interpreter was used.  Mental Health Problem Presenting symptoms: suicidal thoughts   Associated symptoms: no abdominal pain and no chest pain   Associated symptoms comment:  She arrives from her doctor's office via EMS reporting suicidal ideation and gradual overdosing of her vicodin. She states she has increasing thought of wanting to die and take her handicap son with her to relieve her husband of the financial burden. She states "its just time for me to go". She denies having taken any Vicodin today.    Past Medical History  Diagnosis Date  . Depression   . Schizophrenia   . Hyperlipidemia   . Hypertension   . Chronic pain     "over my whole body" (03/30/2013)  . 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 DVT (deep venous thrombosis)     per 01/17/10 d/c summary- "remote hx of dvt"?  . Iron deficiency anemia   . Glaucoma   . Hemorrhoids   . Asthma   . Anxiety   . GERD (gastroesophageal reflux disease)   . Psoriasis 04/29/2012    New onset evaluated by Dr Marylou Flesher, Dermatology, [facility] 6/13  Rx 0.1% Tacrolimus ointment  . Complication of anesthesia     "because I have sleep apnea" (03/30/2013)  . Heart murmur   . Chest pain, exertional   . Chronic bronchitis   . Sleep apnea     "waiting on my CPAP" (03/30/2013)  . Pneumonia     "a few times" (03/30/2013)  . Exertional shortness of breath     "& sometimes when laying down" (03/30/2013)  . Daily headache     "last 2 months" (03/30/2013)  . Arthritis     "joints" (03/30/2013)  . Varicose veins of legs   .  Psoriasis    Past Surgical History  Procedure Laterality Date  . Spinal fusion      2004  . Carpal tunnel release Bilateral   . Tubal ligation  1973  . Dilation and curettage of uterus  1970's    "once" (03/30/2013)  . 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 Joint replacement     Family History  Problem Relation Age of Onset  . Diabetes Sister   . Colon cancer Brother 40   History  Substance Use Topics  . Smoking status: Never Smoker   . Smokeless tobacco: Never Used  . Alcohol Use: No   OB History   Grav Para Term Preterm Abortions TAB SAB Ect Mult Living                 Review of Systems  Constitutional: Negative for fever and chills.  Respiratory: Negative.  Negative for shortness of breath.   Cardiovascular: Positive for leg swelling. Negative for chest pain.  Gastrointestinal: Negative.  Negative for abdominal pain.  Musculoskeletal: Negative.   Skin: Negative.   Neurological: Negative.   Psychiatric/Behavioral: Positive for suicidal ideas and dysphoric mood.      Allergies  Review of patient's allergies indicates no known allergies.  Home Medications   Prior to Admission medications   Medication  Sig Start Date End Date Taking? Authorizing Provider  albuterol (PROVENTIL HFA;VENTOLIN HFA) 108 (90 BASE) MCG/ACT inhaler Inhale 2 puffs into the lungs every 4 (four) hours as needed for wheezing or shortness of breath. 10/21/13  Yes Hayden Rasmussenavid Mabe, NP  atenolol (TENORMIN) 100 MG tablet Take 1 tablet (100 mg total) by mouth daily. 01/29/14  Yes Hazeline Junkeryan Grunz, MD  atorvastatin (LIPITOR) 80 MG tablet Take 1 tablet (80 mg total) by mouth daily. 01/29/14  Yes Hazeline Junkeryan Grunz, MD  esomeprazole (NEXIUM) 40 MG capsule Take 1 capsule (40 mg total) by mouth daily. 01/29/14  Yes Hazeline Junkeryan Grunz, MD  FLUoxetine (PROZAC) 20 MG capsule Take 60 mg by mouth every morning.    Yes Historical Provider, MD  fluticasone (FLOVENT HFA) 220 MCG/ACT inhaler Inhale 2 puffs into the lungs 3 (three) times daily.    Yes Historical Provider, MD  gabapentin (NEURONTIN) 100 MG capsule Take 2 capsules (200 mg total) by mouth 3 (three) times daily. 01/29/14  Yes Hazeline Junkeryan Grunz, MD  hydrochlorothiazide (HYDRODIURIL) 25 MG tablet Take 1 tablet (25 mg total) by mouth every morning. 01/29/14  Yes Hazeline Junkeryan Grunz, MD  HYDROcodone-acetaminophen (NORCO) 10-325 MG per tablet Take 1 tablet by mouth 2 (two) times daily as needed for severe pain. 01/29/14  Yes Hazeline Junkeryan Grunz, MD  hydrOXYzine (ATARAX/VISTARIL) 10 MG tablet Take 10 mg by mouth at bedtime.    Yes Historical Provider, MD  lisinopril (PRINIVIL,ZESTRIL) 10 MG tablet Take 1 tablet (10 mg total) by mouth daily. 01/29/14  Yes Hazeline Junkeryan Grunz, MD  PRESCRIPTION MEDICATION Apply 1 application topically daily. Silver sulfadiazine / triamcinolone cream= [facility] by [facility] For psoriasis   Yes Historical Provider, MD  sucralfate (CARAFATE) 1 G tablet Take 1 g by mouth 4 (four) times daily.   Yes Historical Provider, MD  warfarin (COUMADIN) 5 MG tablet Take 1 tablet (5 mg total) by mouth daily. 01/28/14  Yes Levert FeinsteinJames M Granfortuna, MD   BP 159/103  Pulse 75  Temp(Src) 98.5 F (36.9 C) (Oral)  Resp 18  SpO2 96%  LMP 11/22/1996 Physical Exam  Constitutional: She is oriented to person, place, and time. She appears well-developed and well-nourished.  HENT:  Head: Normocephalic.  Neck: Normal range of motion. Neck supple.  Cardiovascular: Normal rate and regular rhythm.   Pulmonary/Chest: Effort normal and breath sounds normal.  Abdominal: Soft. Bowel sounds are normal. There is no tenderness. There is no rebound and no guarding.  Musculoskeletal: Normal range of motion. She exhibits edema.  Neurological: She is alert and oriented to person, place, and time.  Skin: Skin is warm and dry. No rash noted.  Psychiatric: She has a normal mood and affect.    ED Course  Procedures (including critical care time) Labs Review Labs Reviewed  CBC - Abnormal; Notable for the following:    RBC 3.64 (*)     Hemoglobin 11.6 (*)    HCT 34.1 (*)    All other components within normal limits  COMPREHENSIVE METABOLIC PANEL - Abnormal; Notable for the following:    BUN 25 (*)    Creatinine, Ser 1.11 (*)    GFR calc non Af Amer 49 (*)    GFR calc Af Amer 57 (*)    All other components within normal limits  SALICYLATE LEVEL - Abnormal; Notable for the following:    Salicylate Lvl <2.0 (*)    All other components within normal limits  URINE RAPID DRUG SCREEN (HOSP PERFORMED) - Abnormal; Notable for the following:  Opiates POSITIVE (*)    All other components within normal limits  ACETAMINOPHEN LEVEL  ETHANOL    Imaging Review No results found.   EKG Interpretation None      MDM   Final diagnoses:  None    1. Suicidal ideation 2. Depression  [facility] to see and evaluate for placement.     Arnoldo HookerShari A Aceton Kinnear, PA-C 02/02/14 1924

## 2014-02-02 NOTE — ED Notes (Signed)
Pt arrived to the ED via EMS with a complaint of suicidal ideations.  Pt states she has shortness of breath, sweating.  Pt has over took her Vicodin prescription several times this month.  Pt's suicidal and homicidal ideations have increased over the last month.  Pt plans to kill herself and her handicapped son with a drink of pesticides.  EMS states that there is no gun at her house.

## 2014-02-02 NOTE — ED Notes (Signed)
Patient to treatment area 39 via ambulatory

## 2014-02-02 NOTE — BH Assessment (Signed)
Clinician contacted Dr. Carollee HerterWolfford to gather report prior to seeing pt. Dr. Carollee HerterWolfford reported he has not seen pt and previous PA who had seen pt has left for the night. Clinician reviewed notes. Assessment to be initiated.   [name] [name], MSW, LCSW Triage Specialist [phone]

## 2014-02-02 NOTE — ED Provider Notes (Signed)
Medical screening examination/treatment/procedure(s) were conducted as a shared visit with non-physician practitioner(s) and myself.  I personally evaluated the patient during the encounter.   EKG Interpretation None     Patient seen examined. Has increased depression and suicidal ideations. Disposition per behavior health  Toy BakerAnthony T Valentine Kuechle, MD 02/02/14 2350

## 2014-02-03 ENCOUNTER — Encounter (HOSPITAL_COMMUNITY): Payer: Self-pay | Admitting: Registered Nurse

## 2014-02-03 DIAGNOSIS — R45850 Homicidal ideations: Secondary | ICD-10-CM

## 2014-02-03 DIAGNOSIS — F322 Major depressive disorder, single episode, severe without psychotic features: Secondary | ICD-10-CM | POA: Diagnosis present

## 2014-02-03 DIAGNOSIS — F329 Major depressive disorder, single episode, unspecified: Secondary | ICD-10-CM

## 2014-02-03 DIAGNOSIS — R45851 Suicidal ideations: Secondary | ICD-10-CM

## 2014-02-03 LAB — PROTIME-INR
INR: 1.09 (ref 0.00–1.49)
Prothrombin Time: 13.9 seconds (ref 11.6–15.2)

## 2014-02-03 MED ORDER — FLUOXETINE HCL 20 MG PO CAPS: 20.0000 mg | ORAL_CAPSULE | Freq: Every day | ORAL | Status: DC

## 2014-02-03 MED ORDER — DIAZEPAM 5 MG PO TABS
5.0000 mg | ORAL_TABLET | Freq: Once | ORAL | Status: AC
  Administered 2014-02-03: 5 mg via ORAL
  Filled 2014-02-03: qty 1

## 2014-02-03 NOTE — Discharge Instructions (Signed)
Depression, Adult °Depression refers to feeling sad, low, down in the dumps, blue, gloomy, or empty. In general, there are two kinds of depression: °1. Depression that we all experience from time to time because of upsetting life experiences, including the loss of a job or the ending of a relationship (normal sadness or normal grief). This kind of depression is considered normal, is short lived, and resolves within a few days to 2 weeks. (Depression experienced after the loss of a loved one is called bereavement. Bereavement often lasts longer than 2 weeks but normally gets better with time.) °2. Clinical depression, which lasts longer than normal sadness or normal grief or interferes with your ability to function at home, at work, and in school. It also interferes with your personal relationships. It affects almost every aspect of your life. Clinical depression is an illness. °Symptoms of depression also can be caused by conditions other than normal sadness and grief or clinical depression. Examples of these conditions are listed as follows: °· Physical illness Some physical illnesses, including underactive thyroid gland (hypothyroidism), severe anemia, specific types of cancer, diabetes, uncontrolled seizures, heart and lung problems, strokes, and chronic pain are commonly associated with symptoms of depression. °· Side effects of some prescription medicine In some people, certain types of prescription medicine can cause symptoms of depression. °· Substance abuse Abuse of alcohol and illicit drugs can cause symptoms of depression. °SYMPTOMS °Symptoms of normal sadness and normal grief include the following: °· Feeling sad or crying for short periods of time. °· Not caring about anything (apathy). °· Difficulty sleeping or sleeping too much. °· No longer able to enjoy the things you used to enjoy. °· Desire to be by oneself all the time (social isolation). °· Lack of energy or motivation. °· Difficulty  concentrating or remembering. °· Change in appetite or weight. °· Restlessness or agitation. °Symptoms of clinical depression include the same symptoms of normal sadness or normal grief and also the following symptoms: °· Feeling sad or crying all the time. °· Feelings of guilt or worthlessness. °· Feelings of hopelessness or helplessness. °· Thoughts of suicide or the desire to harm yourself (suicidal ideation). °· Loss of touch with reality (psychotic symptoms). Seeing or hearing things that are not real (hallucinations) or having false beliefs about your life or the people around you (delusions and paranoia). °DIAGNOSIS  °The diagnosis of clinical depression usually is based on the severity and duration of the symptoms. Your caregiver also will ask you questions about your medical history and substance use to find out if physical illness, use of prescription medicine, or substance abuse is causing your depression. Your caregiver also may order blood tests. °TREATMENT  °Typically, normal sadness and normal grief do not require treatment. However, sometimes antidepressant medicine is prescribed for bereavement to ease the depressive symptoms until they resolve. °The treatment for clinical depression depends on the severity of your symptoms but typically includes antidepressant medicine, counseling with a mental health professional, or a combination of both. Your caregiver will help to determine what treatment is best for you. °Depression caused by physical illness usually goes away with appropriate medical treatment of the illness. If prescription medicine is causing depression, talk with your caregiver about stopping the medicine, decreasing the dose, or substituting another medicine. °Depression caused by abuse of alcohol or illicit drugs abuse goes away with abstinence from these substances. Some adults need professional help in order to stop drinking or using drugs. °SEEK IMMEDIATE CARE IF: °· You have   thoughts  about hurting yourself or others.  You lose touch with reality (have psychotic symptoms).  You are taking medicine for depression and have a serious side effect. FOR MORE INFORMATION National Alliance on Mental Illness: www.nami.[employer] of Mental Health: https://carter.com/ Document Released: 09/07/2000 Document Revised: 03/11/2012 Document Reviewed: 12/10/2011 [facility] ([facility]) Patient Information 2014 [place].  Major Depressive Disorder Major depressive disorder (MDD) is a mental illness. It also may be called clinical depression or unipolar depression. MDD usually causes feelings of sadness, hopelessness, or helplessness. Some people with MDD do not feel particularly sad but lose interest in doing things they used to enjoy (anhedonia). MDD also can cause physical symptoms. It can interfere with work, school, relationships, and other normal everyday activities. MDD varies in severity but is longer lasting and more serious than the sadness we all feel from time to time in our lives. MDD often is triggered by stressful life events or major life changes. Examples of these triggers include divorce, loss of your job or home, a move, and the death of a family member or close friend. Sometimes MDD occurs for no obvious reason at all. People who have family members with MDD or bipolar disorder are at higher risk for developing MDD, with or without life stressors. MDD can occur at any age. It may occur just once in your life (single episode MDD). It may occur multiple times (recurrent MDD). SYMPTOMS People with MDD have either anhedonia or depressed mood on nearly a daily basis for at least 2 weeks or longer. Symptoms of depressed mood include:  Feelings of sadness (blue or down in the dumps) or emptiness.  Feelings of hopelessness or helplessness.  Tearfulness or episodes of crying (may be observed by others).  Irritability (children and adolescents). In addition to depressed mood or  anhedonia or both, people with MDD have at least four of the following symptoms:  Difficulty sleeping or sleeping too much.   Significant change (increase or decrease) in appetite or weight.   Lack of energy or motivation.  Feelings of guilt and worthlessness.   Difficulty concentrating, remembering, or making decisions.  Unusually slow movement (psychomotor retardation) or restlessness (as observed by others).   Recurrent wishes for death, recurrent thoughts of self-harm (suicide), or a suicide attempt. People with MDD commonly have persistent negative thoughts about themselves, other people, and the world. People with severe MDD may experiencedistorted beliefs or perceptions about the world (psychotic delusions). They also may see or hear things that are not real (psychotic hallucinations). DIAGNOSIS MDD is diagnosed through an assessment by your caregiver. Your caregiver will ask aboutaspects of your daily life, such as mood,sleep, and appetite, to see if you have the diagnostic symptoms of MDD. Your caregiver may ask about your medical history and use of alcohol or drugs, including prescription medications. Your caregiver also may do a physical exam and blood work. This is because certain medical conditions and the use of certain substances can cause MDD-like symptoms (secondary depression). Your caregiver also may refer you to a mental health specialist for further evaluation and treatment. TREATMENT It is important to recognize the symptoms of MDD and seek treatment. The following treatments can be prescribed for MDD:   Medication Antidepressant medications usually are prescribed. Antidepressant medications are thought to correct chemical imbalances in the brain that are commonly associated with MDD. Other types of medication may be added if MDD symptoms do not respond to antidepressant medications alone or if psychotic delusions or hallucinations occur.  Talk therapy Talk  therapy can be helpful in treating MDD by providing support, education, and guidance. Certain types of talk therapy also can help with negative thinking (cognitive behavioral therapy) and with relationship issues that trigger MDD (interpersonal therapy). A mental health specialist can help determine which treatment is best for you. Most people with MDD do well with a combination of medication and talk therapy. Treatments involving electrical stimulation of the brain can be used in situations with extremely severe symptoms or when medication and talk therapy do not work over time. These treatments include electroconvulsive therapy, transcranial magnetic stimulation, and vagal nerve stimulation. Document Released: 01/05/2013 Document Reviewed: 01/05/2013 [facility] Patient Information 2014 [place], [place].  Suicidal Feelings, How to Help Yourself Everyone feels sad or unhappy at times, but depressing thoughts and feelings of hopelessness can lead to thoughts of suicide. It can seem as if life is too tough to handle. If you feel as though you have reached the point where suicide is the only answer, it is time to let someone know immediately.  HOW TO COPE AND PREVENT SUICIDE  Let family, friends, teachers, or counselors know. Get help. Try not to isolate yourself from those who care about you. Even though you may not feel sociable, talk with someone every day. It is best if it is face-to-face. Remember, they will want to help you.  Eat a regularly spaced and well-balanced diet.  Get plenty of rest.  Avoid alcohol and drugs because they will only make you feel worse and may also lower your inhibitions. Remove them from the home. If you are thinking of taking an overdose of your prescribed medicines, give your medicines to someone who can give them to you one day at a time. If you are on antidepressants, let your caregiver know of your feelings so he or she can provide a safer medicine, if that is a  concern.  Remove weapons or poisons from your home.  Try to stick to routines. Follow a schedule and remind yourself that you have to keep that schedule every day.  Set some realistic goals and achieve them. Make a list and cross things off as you go. Accomplishments give a sense of worth. Wait until you are feeling better before doing things you find difficult or unpleasant to do.  If you are able, try to start exercising. Even half-hour periods of exercise each day will make you feel better. Getting out in the sun or into nature helps you recover from depression faster. If you have a favorite place to walk, take advantage of that.  Increase safe activities that have always given you pleasure. This may include playing your favorite music, reading a good book, painting a picture, or playing your favorite instrument. Do whatever takes your mind off your depression.  Keep your living space well-lighted. GET HELP Contact a suicide hotline, crisis center, or local suicide prevention center for help right away. Local centers may include a hospital, clinic, community service organization, social service provider, or health department.  Call your local emergency services (911 in the [place]).  Call a suicide hotline:  1-800-273-TALK (224-618-63131-913 616 3695) in the [place].  1-800-SUICIDE (640)188-0960(1-417-472-7106) in the [place].  (856)887-17911-(279)872-5532 in the [place] for Spanish-speaking counselors.  4-696-295-2WUX1-800-799-4TTY 762 693 9716(1-419-276-7128) in the [place] for TTY users.  Visit the following websites for information and help:  National Suicide Prevention Lifeline: www.suicidepreventionlifeline.org  Hopeline: www.hopeline.com  [employer] for Suicide Prevention: https://www.ayers.com/www.afsp.org  For lesbian, gay, bisexual, transgender, or questioning  youth, contact [employer]:  1-478-2-N-FAOZHY1-866-4-U-TREVOR 801-750-7810(1-970-660-0377) in the [place].  www.thetrevorproject.org  In [place], treatment resources  are listed in each province with listings available under [employer] or similar titles. Another source for Crisis Centres by [place] is located at http://www.suicideprevention.ca/in-crisis-now/find-a-crisis-centre-now/crisis-centres Document Released: 03/17/2003 Document Revised: 12/03/2011 Document Reviewed: 08/05/2007 [facility] Patient Information 2014 [place], [place].

## 2014-02-03 NOTE — BH Assessment (Signed)
Tele Assessment Note   [name] [name] is an 73 y.o. female who presents voluntarily to [facility] due to SI and HI with plan to give herself and son pesticides.  Pt reported she is "grieving too much." Pt reports feeling overwhelmed and disappointed with no finding the appropriate support for her 7 y/o son with epilepsy. Pt reported her son has recently been given a feeding tube and it is making it difficult to place him in assisted living home which she prefers. Pt reported constant pain in her back with caring for him . Pt reported "I'm really tired and I'm not scared of death." Pt reported she does not want to waste anymore time. Pt stated she does not want to leave her son with her husband to be taken care of.  Pt refused to provide plan to clinician although medical notes indicated plan. Pt stated she did not want anyone to know her plan and try to stop her. Pt reported also being overwhelmed due to her relationship with her husband. Pt reported they don't get along and they don't have much in common. Pt reported since he retired about a year ago, things got worse because he was always around. Pt was unable to identify any natural supports. Pt reported expressing her SI to husband but stated she doesn't think he takes her seriously.   Pt reported AVH. Pt reported daily AVH for about 10 years. Pt reported seeing people walk past her who are not really there and hearing people call her name and no one is home. Pt reported feeling depressed and anxious mood with congruent affect. Pt was OX3 without being oriented to place. Pt was unaware of the hospital she was located.     Axis I: Major Depression, Recurrent severe and Psychotic Disorder NOS Axis II: Deferred Axis III:  Past Medical History  Diagnosis Date  . Depression   . Schizophrenia   . Hyperlipidemia   . Hypertension   . Chronic pain     "over my whole body" (03/30/2013)  . 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 DVT  (deep venous thrombosis)     per 01/17/10 d/c summary- "remote hx of dvt"?  . Iron deficiency anemia   . Glaucoma   . Hemorrhoids   . Asthma   . Anxiety   . GERD (gastroesophageal reflux disease)   . Psoriasis 04/29/2012    New onset evaluated by Dr Marylou Flesher, Dermatology, [facility] 6/13  Rx 0.1% Tacrolimus ointment  . Complication of anesthesia     "because I have sleep apnea" (03/30/2013)  . Heart murmur   . Chest pain, exertional   . Chronic bronchitis   . Sleep apnea     "waiting on my CPAP" (03/30/2013)  . Pneumonia     "a few times" (03/30/2013)  . Exertional shortness of breath     "& sometimes when laying down" (03/30/2013)  . Daily headache     "last 2 months" (03/30/2013)  . Arthritis     "joints" (03/30/2013)  . Varicose veins of legs   . Psoriasis    Axis IV: problems related to social environment, problems with access to health care services and problems with primary support group  Past Medical History:  Past Medical History  Diagnosis Date  . Depression   . Schizophrenia   . Hyperlipidemia   . Hypertension   . Chronic pain     "over my whole body" (03/30/2013)  .  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 DVT (deep venous thrombosis)     per 01/17/10 d/c summary- "remote hx of dvt"?  . Iron deficiency anemia   . Glaucoma   . Hemorrhoids   . Asthma   . Anxiety   . GERD (gastroesophageal reflux disease)   . Psoriasis 04/29/2012    New onset evaluated [facility] Dr Marylou FlesherWilliam Huang[facility]  Rx 0.1% Tacrolimus ointment  . Complication of anesthesia     "because I have sleep apnea" (03/30/2013)  . Heart murmur   . Chest pain, exertional   . Chronic bronchitis   . Sleep apnea     "waiting on my CPAP" (03/30/2013)  . Pneumonia     "a few times" (03/30/2013)  . Exertional shortness of breath     "& sometimes when laying down" (03/30/2013)  . Daily headache     "last 2 months" (03/30/2013)  . Arthritis     "joints" (03/30/2013)  . Varicose  veins of legs   . Psoriasis     Past Surgical History  Procedure Laterality Date  . Spinal fusion      2004  . Carpal tunnel release Bilateral   . Tubal ligation  1973  . Dilation and curettage of uterus  1970's    "once" (03/30/2013)  . 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 Joint replacement      Family History:  Family History  Problem Relation Age of Onset  . Diabetes Sister   . Colon cancer Brother 1840    Social History:  reports that she has never smoked. She has never used smokeless tobacco. She reports that she does not drink alcohol or use illicit drugs.  Additional Social History:  Alcohol / Drug Use Pain Medications: none reported Prescriptions: Prozac Over the Counter: none reported History of alcohol / drug use?: No history of alcohol / drug abuse  CIWA: CIWA-Ar BP: 109/68 mmHg Pulse Rate: 82 COWS:    Allergies: No Known Allergies  Home Medications:  (Not in a hospital admission)  OB/GYN Status:  Patient's last menstrual period was 11/22/1996.  General Assessment Data [facility] of [facility]: [facility] Is this a Tele or Face-to-Face Assessment?: Face-to-Face Is this an Initial Assessment or a Re-assessment for this encounter?: Initial Assessment Living Arrangements: Spouse/significant other;Children Can pt return to current living arrangement?: Yes Admission Status: Voluntary Is patient capable of signing voluntary admission?: Yes Transfer from: Acute Hospital Referral Source: Self/Family/Friend  Medical Screening Exam [facility] Walk-in ONLY) Medical Exam completed:  (NA) Reason for MSE not completed:  (NA)  [facility] Living Arrangements: Spouse/significant other;Children Name of Psychiatrist:  Museum/gallery curator([facility]) Name of Therapist:  (none)     Risk to self Suicidal Ideation: Yes-Currently Present Suicidal Intent: Yes-Currently Present Is patient at risk for suicide?: Yes Suicidal Plan?: Yes-Currently Present Specify Current Suicidal Plan:  (Pt  plan to drink pesticides. ) Access to Means: Yes Specify Access to Suicidal Means:  (unk) What has been your use of drugs/alcohol within the last 12 months?:  (none) Previous Attempts/Gestures: No How many times?:  (0) Other Self Harm Risks:  (no) Triggers for Past Attempts: None known Intentional Self Injurious Behavior: None Family Suicide History: Unknown Recent stressful life event(s): Other (Comment) (Pt overwhelmed with caring for her disabled son and no suppo) Persecutory voices/beliefs?: No Depression: Yes Depression Symptoms: Despondent;Insomnia;Fatigue;Guilt;Feeling worthless/self pity (Hopelessness) Substance abuse history and/or treatment for substance abuse?: No Suicide prevention information given  to non-admitted patients: Not applicable  Risk to Others Homicidal Ideation: Yes-Currently Present Thoughts of Harm to Others: Yes-Currently Present Comment - Thoughts of Harm to Others:  (Pt reported wanting to "take her son with her.") Current Homicidal Intent: Yes-Currently Present Current Homicidal Plan: Yes-Currently Present Describe Current Homicidal Plan:  (Pt plan to give her disabled son pesticides. ) Access to Homicidal Means: Yes Describe Access to Homicidal Means:  (Unk if pt has access to pesticides.) Identified Victim:  (Pt's son Lennie HummerKwame. ) History of harm to others?: No Assessment of Violence: None Noted Violent Behavior Description:  (Pt was calm and cooperative.) Does patient have access to weapons?: No Criminal Charges Pending?: No Does patient have a court date: No  Psychosis Hallucinations: Auditory;Visual Delusions: None noted  Mental Status Report Appear/Hygiene: Other (Comment) (Pt wearing paper scrubs.) Eye Contact: Good Motor Activity: Unremarkable Speech: Logical/coherent Level of Consciousness: Alert Mood: Depressed;Anxious Affect: Depressed Anxiety Level: Minimal Thought Processes: Coherent;Relevant Judgement: Impaired Orientation:  Person;Time;Situation Obsessive Compulsive Thoughts/Behaviors: None  Cognitive Functioning Concentration: Normal Memory: Recent Intact;Remote Intact IQ: Average Insight: Fair Impulse Control: Fair Appetite: Good Weight Loss:  (unk) Weight Gain:  (unk) Sleep: Decreased Total Hours of Sleep:  (8) Vegetative Symptoms: None  ADLScreening [facility] Assessment Services) Patient's cognitive ability adequate to safely complete daily activities?: Yes Patient able to express need for assistance with ADLs?: Yes Independently performs ADLs?: Yes (appropriate for developmental age)  Prior Inpatient Therapy Prior Inpatient Therapy: Yes Prior Therapy Dates:  (2011, 2005) [facility]  [facility] [place] Reason for Treatment:  (depression)  Prior Outpatient Therapy Prior Outpatient Therapy: Yes Prior Therapy Dates:  (current) Prior Therapy Facilty/Provider(s):  Museum/gallery curator([facility]) Reason for Treatment:  (medication management)  ADL Screening (condition at time of admission) Patient's cognitive ability adequate to safely complete daily activities?: Yes Is the patient deaf or have difficulty hearing?: No Does the patient have difficulty seeing, even when wearing glasses/contacts?: No Patient able to express need for assistance with ADLs?: Yes Does the patient have difficulty dressing or bathing?: No Independently performs ADLs?: Yes (appropriate for developmental age)       Abuse/Neglect Assessment (Assessment to be complete while patient is alone) Physical Abuse: Denies Verbal Abuse: Denies Sexual Abuse: Denies Exploitation of patient/patient's resources: Denies Self-Neglect: Denies Values / Beliefs Cultural Requests During Hospitalization: None Spiritual Requests During Hospitalization: None   Advance Directives (For Healthcare) Advance Directive: Patient does not have advance directive;Patient would like information    Additional Information 1:1 In  Past 12 Months?: No CIRT Risk: No Elopement Risk: No Does patient have medical clearance?: Yes     Disposition:  Clinician consulted with Alberteen SamFran Hobson NP who states Pt meets criteria for inpatient admission. NP recommends pt to be referred to gero-psych unit. TTS will seek placement.    Disposition Initial Assessment Completed for this Encounter: Yes Disposition of Patient: Inpatient treatment program Type of inpatient treatment program: Adult  Alric QuanDelilah R Stewart 02/03/2014 1:15 AM

## 2014-02-03 NOTE — [facility] Counselor (Addendum)
Education officer, environmentalWriter left voicemail for [facility] admissions. Nicholos JohnsKathleen from [facility] called back to say pt can be transported now. Minerva AreolaEric, pt's RN, will call to arrange transport.  Evette Cristalaroline Paige Noe Goyer, [place] Assessment Counselor 9:35 am   Pt signed voluntary consent form faxed from [facility]. Writer faxed EKG and signed consent form to [facility].  Evette Cristalaroline Paige Brody Bonneau, [place]WA Assessment Counselor 8:30 am

## 2014-02-03 NOTE — Consult Note (Signed)
Face to face evaluation and I agree with this note 

## 2014-02-03 NOTE — BH Assessment (Signed)
Pt accepted by physician Vella RaringBen Palombo to [facility] per [place] pending EKG and signed voluntary consent. Clinician provided updates to Dr. Adriana Simasook. Clinician will also update AM TTS staff for follow up.    Yaakov Guthrieelilah Stewart, MSW, LCSW Triage Specialist [phone]

## 2014-02-03 NOTE — BH Assessment (Signed)
Clinician consulted with Alberteen SamFran Hobson NP who states Pt meets criteria for inpatient admission. Drenda FreezeFran recommends pt to be referred to gero-psych unit. Clinician provided updates to Dr. Adriana Simasook.  [name] [name], MSW, LCSW Triage Specialist [phone]

## 2014-02-03 NOTE — Consult Note (Signed)
[facility] Consult   Reason for Consult:  Depression, suicidal, homicidal Referring Physician:  EDP  [name] [name] is an 73 y.o. female. Total Time spent with patient: 45 minutes  Assessment: AXIS I:  Major Depression, single episode AXIS II:  Deferred AXIS III:   Past Medical History  Diagnosis Date  . Depression   . Schizophrenia   . Hyperlipidemia   . Hypertension   . Chronic pain     "over my whole body" (03/30/2013)  . 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 DVT (deep venous thrombosis)     per 01/17/10 d/c summary- "remote hx of dvt"?  . Iron deficiency anemia   . Glaucoma   . Hemorrhoids   . Asthma   . Anxiety   . GERD (gastroesophageal reflux disease)   . Psoriasis 04/29/2012    New onset evaluated by Dr Juliet Rude, Dermatology, [facility] 6/13  Rx 0.1% Tacrolimus ointment  . Complication of anesthesia     "because I have sleep apnea" (03/30/2013)  . Heart murmur   . Chest pain, exertional   . Chronic bronchitis   . Sleep apnea     "waiting on my CPAP" (03/30/2013)  . Pneumonia     "a few times" (03/30/2013)  . Exertional shortness of breath     "& sometimes when laying down" (03/30/2013)  . Daily headache     "last 2 months" (03/30/2013)  . Arthritis     "joints" (03/30/2013)  . Varicose veins of legs   . Psoriasis    AXIS IV:  other psychosocial or environmental problems AXIS V:  11-20 some danger of hurting self or others possible OR occasionally fails to maintain minimal personal hygiene OR gross impairment in communication  Plan:  Recommend psychiatric Inpatient admission when medically cleared.  Subjective:   [name] [name] is a 73 y.o. female patient admitted with Major Depressive Disorder, single episode, severe.  HPI:  Patient states that the thought of killing her self and her son has been occuring daily.  "The thoughts to kill myself and son occur everyday.  I am getting really tired; seems like it is taking me so  long to take care of things now; my body is getting tired and I don't want to leave my son with no body else to take care or. My body is wearing down and that is just the only way that I know I will have a piece of mind.  There is nobody in my family that can care for him if I'm gone.  You know when I was growing up we had less but we done better.  Family, community, and friends were there for you.  Now times have changed and people don't help and he can't be alone." Patient continues to endorse suicidal and homicidal ideation.  Patient denies psychosis and paranoia.    HPI Elements:   Location:  Major Depression. Quality:  suicidal. Severity:  homicidal. Timing:  several months. Review of Systems  Constitutional: Negative for chills and malaise/fatigue.  Gastrointestinal: Negative for diarrhea and constipation.  Musculoskeletal: Positive for back pain and joint pain.  Neurological: Negative for loss of consciousness, weakness and headaches.  Psychiatric/Behavioral: Positive for depression and suicidal ideas. Negative for hallucinations and substance abuse. The patient is not nervous/anxious.     Denies family history of mental illness Past Psychiatric History: Past Medical History  Diagnosis Date  . Depression   . Schizophrenia   .  Hyperlipidemia   . Hypertension   . Chronic pain     "over my whole body" (03/30/2013)  . 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 DVT (deep venous thrombosis)     per 01/17/10 d/c summary- "remote hx of dvt"?  . Iron deficiency anemia   . Glaucoma   . Hemorrhoids   . Asthma   . Anxiety   . GERD (gastroesophageal reflux disease)   . Psoriasis 04/29/2012    New onset evaluated by Dr Juliet Rude, Dermatology, [facility] 6/13  Rx 0.1% Tacrolimus ointment  . Complication of anesthesia     "because I have sleep apnea" (03/30/2013)  . Heart murmur   . Chest pain, exertional   . Chronic bronchitis   . Sleep apnea     "waiting on my  CPAP" (03/30/2013)  . Pneumonia     "a few times" (03/30/2013)  . Exertional shortness of breath     "& sometimes when laying down" (03/30/2013)  . Daily headache     "last 2 months" (03/30/2013)  . Arthritis     "joints" (03/30/2013)  . Varicose veins of legs   . Psoriasis     reports that she has never smoked. She has never used smokeless tobacco. She reports that she does not drink alcohol or use illicit drugs. Family History  Problem Relation Age of Onset  . Diabetes Sister   . Colon cancer Brother 26   Family History Substance Abuse: No Family Supports: No Living Arrangements: Spouse/significant other;Children Can pt return to current living arrangement?: Yes Abuse/Neglect [facility]) Physical Abuse: Denies Verbal Abuse: Denies Sexual Abuse: Denies Allergies:  No Known Allergies  ACT Assessment Complete:  Yes:    Educational Status    Risk to Self: Risk to self Suicidal Ideation: Yes-Currently Present Suicidal Intent: Yes-Currently Present Is patient at risk for suicide?: Yes Suicidal Plan?: Yes-Currently Present Specify Current Suicidal Plan:  (Pt plan to drink pesticides. ) Access to Means: Yes Specify Access to Suicidal Means:  (unk) What has been your use of drugs/alcohol within the last 12 months?:  (none) Previous Attempts/Gestures: No How many times?:  (0) Other Self Harm Risks:  (no) Triggers for Past Attempts: None known Intentional Self Injurious Behavior: None Family Suicide History: Unknown Recent stressful life event(s): Other (Comment) (Pt overwhelmed with caring for her disabled son and no suppo) Persecutory voices/beliefs?: No Depression: Yes Depression Symptoms: Despondent;Insomnia;Fatigue;Guilt;Feeling worthless/self pity (Hopelessness) Substance abuse history and/or treatment for substance abuse?: No Suicide prevention information given to non-admitted patients: Not applicable  Risk to Others: Risk to Others Homicidal Ideation: Yes-Currently  Present Thoughts of Harm to Others: Yes-Currently Present Comment - Thoughts of Harm to Others:  (Pt reported wanting to "take her son with her.") Current Homicidal Intent: Yes-Currently Present Current Homicidal Plan: Yes-Currently Present Describe Current Homicidal Plan:  (Pt plan to give her disabled son pesticides. ) Access to Homicidal Means: Yes Describe Access to Homicidal Means:  (Unk if pt has access to pesticides.) Identified Victim:  (Pt's son Ebony Cargo. ) History of harm to others?: No Assessment of Violence: None Noted Violent Behavior Description:  (Pt was calm and cooperative.) Does patient have access to weapons?: No Criminal Charges Pending?: No Does patient have a court date: No  Abuse: Abuse/Neglect Assessment (Assessment to be complete while patient is alone) Physical Abuse: Denies Verbal Abuse: Denies Sexual Abuse: Denies Exploitation of patient/patient's resources: Denies Self-Neglect: Denies  Prior Inpatient Therapy: Prior Inpatient Therapy Prior Inpatient  Therapy: Yes Prior Therapy Dates:  (2011, 2005) Prior Therapy Facilty/Provider(s):  ([facility], "hospital in [place]") Reason for Treatment:  (depression)  Prior Outpatient Therapy: Prior Outpatient Therapy Prior Outpatient Therapy: Yes Prior Therapy Dates:  (current) Prior Therapy Facilty/Provider(s):  [facility]) Reason for Treatment:  (medication management)  Additional Information: Additional Information 1:1 In Past 12 Months?: No CIRT Risk: No Elopement Risk: No Does patient have medical clearance?: Yes    Objective: Blood pressure 113/71, pulse 64, temperature 98.1 F (36.7 C), temperature source Oral, resp. rate 18, last menstrual period 11/22/1996, SpO2 96.00%.There is no weight on file to calculate BMI. Results for orders placed during the hospital encounter of 02/02/14 (from the past 72 hour(s))  URINE RAPID DRUG SCREEN (HOSP PERFORMED)     Status: Abnormal   Collection Time    02/02/14  5:54  PM      Result Value Ref Range   Opiates POSITIVE (*) NONE DETECTED   Cocaine NONE DETECTED  NONE DETECTED   Benzodiazepines NONE DETECTED  NONE DETECTED   Amphetamines NONE DETECTED  NONE DETECTED   Tetrahydrocannabinol NONE DETECTED  NONE DETECTED   Barbiturates NONE DETECTED  NONE DETECTED   Comment:            DRUG SCREEN FOR MEDICAL PURPOSES     ONLY.  IF CONFIRMATION IS NEEDED     FOR ANY PURPOSE, NOTIFY LAB     WITHIN 5 DAYS.                LOWEST DETECTABLE LIMITS     FOR URINE DRUG SCREEN     Drug Class       Cutoff (ng/mL)     Amphetamine      1000     Barbiturate      200     Benzodiazepine   409     Tricyclics       735     Opiates          300     Cocaine          300     THC              50  ACETAMINOPHEN LEVEL     Status: None   Collection Time    02/02/14  6:04 PM      Result Value Ref Range   Acetaminophen (Tylenol), Serum <15.0  10 - 30 ug/mL   Comment:            THERAPEUTIC CONCENTRATIONS VARY     SIGNIFICANTLY. A RANGE OF 10-30     ug/mL MAY BE AN EFFECTIVE     CONCENTRATION FOR MANY PATIENTS.     HOWEVER, SOME ARE BEST TREATED     AT CONCENTRATIONS OUTSIDE THIS     RANGE.     ACETAMINOPHEN CONCENTRATIONS     >150 ug/mL AT 4 HOURS AFTER     INGESTION AND >50 ug/mL AT 12     HOURS AFTER INGESTION ARE     OFTEN ASSOCIATED WITH TOXIC     REACTIONS.  CBC     Status: Abnormal   Collection Time    02/02/14  6:04 PM      Result Value Ref Range   WBC 6.2  4.0 - 10.5 K/uL   RBC 3.64 (*) 3.87 - 5.11 MIL/uL   Hemoglobin 11.6 (*) 12.0 - 15.0 g/dL   HCT 34.1 (*) 36.0 - 46.0 %   MCV 93.7  78.0 -  100.0 fL   MCH 31.9  26.0 - 34.0 pg   MCHC 34.0  30.0 - 36.0 g/dL   RDW 14.1  11.5 - 15.5 %   Platelets 269  150 - 400 K/uL  COMPREHENSIVE METABOLIC PANEL     Status: Abnormal   Collection Time    02/02/14  6:04 PM      Result Value Ref Range   Sodium 140  137 - 147 mEq/L   Potassium 3.8  3.7 - 5.3 mEq/L   Chloride 98  96 - 112 mEq/L   CO2 31  19 - 32  mEq/L   Glucose, Bld 97  70 - 99 mg/dL   BUN 25 (*) 6 - 23 mg/dL   Creatinine, Ser 1.11 (*) 0.50 - 1.10 mg/dL   Calcium 9.9  8.4 - 10.5 mg/dL   Total Protein 8.2  6.0 - 8.3 g/dL   Albumin 3.6  3.5 - 5.2 g/dL   AST 35  0 - 37 U/L   ALT 21  0 - 35 U/L   Alkaline Phosphatase 99  39 - 117 U/L   Total Bilirubin 0.4  0.3 - 1.2 mg/dL   GFR calc non Af Amer 49 (*) >90 mL/min   GFR calc Af Amer 57 (*) >90 mL/min   Comment: (NOTE)     The eGFR has been calculated using the CKD EPI equation.     This calculation has not been validated in all clinical situations.     eGFR's persistently <90 mL/min signify possible Chronic Kidney     Disease.  ETHANOL     Status: None   Collection Time    02/02/14  6:04 PM      Result Value Ref Range   Alcohol, Ethyl (B) <11  0 - 11 mg/dL   Comment:            LOWEST DETECTABLE LIMIT FOR     SERUM ALCOHOL IS 11 mg/dL     FOR MEDICAL PURPOSES ONLY  SALICYLATE LEVEL     Status: Abnormal   Collection Time    02/02/14  6:04 PM      Result Value Ref Range   Salicylate Lvl <7.4 (*) 2.8 - 20.0 mg/dL  PROTIME-INR     Status: None   Collection Time    02/03/14  2:00 AM      Result Value Ref Range   Prothrombin Time 13.9  11.6 - 15.2 seconds   INR 1.09  0.00 - 1.49   Labs are reviewed see above values.  No critical values noted.  Medications reviewed and no changes made.  Current Facility-Administered Medications  Medication Dose Route Frequency Provider Last Rate Last Dose  . acetaminophen (TYLENOL) tablet 650 mg  650 mg Oral Q4H PRN Shari A Upstill, PA-C   650 mg at 02/02/14 2340  . albuterol (PROVENTIL HFA;VENTOLIN HFA) 108 (90 BASE) MCG/ACT inhaler 2 puff  2 puff Inhalation Q6H PRN Waylan Boga, NP      . atenolol (TENORMIN) tablet 100 mg  100 mg Oral Daily Waylan Boga, NP   100 mg at 02/02/14 2145  . atorvastatin (LIPITOR) tablet 80 mg  80 mg Oral q1800 Waylan Boga, NP      . FLUoxetine (PROZAC) capsule 20 mg  20 mg Oral Daily Waylan Boga, NP   20 mg  at 02/03/14 0936  . fluticasone (FLOVENT HFA) 44 MCG/ACT inhaler 2 puff  2 puff Inhalation BID Waylan Boga, NP   2 puff at  02/03/14 0825  . gabapentin (NEURONTIN) capsule 200 mg  200 mg Oral TID Waylan Boga, NP   200 mg at 02/03/14 0936  . hydrochlorothiazide (MICROZIDE) capsule 25 mg  25 mg Oral Daily Waylan Boga, NP      . hydrOXYzine (ATARAX/VISTARIL) tablet 10 mg  10 mg Oral QHS Waylan Boga, NP   10 mg at 02/02/14 2148  . ondansetron (ZOFRAN) tablet 4 mg  4 mg Oral Q8H PRN Shari A Upstill, PA-C      . pantoprazole (PROTONIX) EC tablet 80 mg  80 mg Oral Q1200 Waylan Boga, NP      . sucralfate (CARAFATE) tablet 1 g  1 g Oral TID WC & HS Waylan Boga, NP   1 g at 02/03/14 0825  . warfarin (COUMADIN) tablet 5 mg  5 mg Oral q1800 Waylan Boga, NP      . Warfarin - Physician Dosing Inpatient   Does not apply q1800 Emiliano Dyer, [facility]      . zolpidem (AMBIEN) tablet 5 mg  5 mg Oral QHS PRN Dewaine Oats, PA-C   5 mg at 02/02/14 2340   Current Outpatient Prescriptions  Medication Sig Dispense Refill  . albuterol (PROVENTIL HFA;VENTOLIN HFA) 108 (90 BASE) MCG/ACT inhaler Inhale 2 puffs into the lungs every 4 (four) hours as needed for wheezing or shortness of breath.  1 Inhaler  0  . atenolol (TENORMIN) 100 MG tablet Take 1 tablet (100 mg total) by mouth daily.  30 tablet  11  . atorvastatin (LIPITOR) 80 MG tablet Take 1 tablet (80 mg total) by mouth daily.  90 tablet  3  . esomeprazole (NEXIUM) 40 MG capsule Take 1 capsule (40 mg total) by mouth daily.  30 capsule  11  . FLUoxetine (PROZAC) 20 MG capsule Take 60 mg by mouth every morning.       . fluticasone (FLOVENT HFA) 220 MCG/ACT inhaler Inhale 2 puffs into the lungs 3 (three) times daily.      Marland Kitchen gabapentin (NEURONTIN) 100 MG capsule Take 2 capsules (200 mg total) by mouth 3 (three) times daily.  180 capsule  11  . hydrochlorothiazide (HYDRODIURIL) 25 MG tablet Take 1 tablet (25 mg total) by mouth every morning.  30 tablet  11  .  HYDROcodone-acetaminophen (NORCO) 10-325 MG per tablet Take 1 tablet by mouth 2 (two) times daily as needed for severe pain.  60 tablet  0  . hydrOXYzine (ATARAX/VISTARIL) 10 MG tablet Take 10 mg by mouth at bedtime.       Marland Kitchen lisinopril (PRINIVIL,ZESTRIL) 10 MG tablet Take 1 tablet (10 mg total) by mouth daily.  30 tablet  11  . PRESCRIPTION MEDICATION Apply 1 application topically daily. Silver sulfadiazine / triamcinolone cream= compounded by [facility] For psoriasis      . sucralfate (CARAFATE) 1 G tablet Take 1 g by mouth 4 (four) times daily.      Marland Kitchen warfarin (COUMADIN) 5 MG tablet Take 1 tablet (5 mg total) by mouth daily.  30 tablet  4    Psychiatric Specialty Exam:     Blood pressure 113/71, pulse 64, temperature 98.1 F (36.7 C), temperature source Oral, resp. rate 18, last menstrual period 11/22/1996, SpO2 96.00%.There is no weight on file to calculate BMI.  General Appearance: Casual  Eye Contact::  Good  Speech:  Clear and Coherent and Normal Rate  Volume:  Normal  Mood:  Depressed  Affect:  Congruent and Depressed  Thought Process:  Circumstantial and Goal  Directed  Orientation:  Full (Time, Place, and Person)  Thought Content:  "I know I shouldn't think like this that's why I'm here to get help"  Suicidal Thoughts:  Yes.  with intent/plan  Homicidal Thoughts:  Yes.  with intent/plan  Memory:  Immediate;   Good Recent;   Good Remote;   Good  Judgement:  Impaired  Insight:  Present  Psychomotor Activity:  Decreased  Concentration:  Fair  Recall:  Good  Fund of Knowledge:Good  Language: Good  Akathisia:  No  Handed:  Right  AIMS (if indicated):     Assets:  Communication Skills Desire for Improvement Housing Social Support  Sleep:      Musculoskeletal: Strength & Muscle Tone: within normal limits Gait & Station: normal Patient leans: N/A  Treatment Plan Summary: Daily contact with patient to assess and evaluate symptoms and progress in treatment Medication  management  Disposition:  Inpatient treatment recommended.  Patient has been accepted by [facility].  Monitor for safety and stabilization until transferred.      Shuvon Rankin, FNP-BC 02/03/2014 10:02 AM

## 2014-02-03 NOTE — ED Notes (Signed)
Pt given towels and hygiene products to shower. Pt was pleasant and calm.

## 2014-02-08 NOTE — ED Provider Notes (Signed)
Medical screening examination/treatment/procedure(s) were performed by non-physician practitioner and as supervising physician I was immediately available for consultation/collaboration.  Toy BakerAnthony T Nakya Weyand, MD 02/08/14 [phone]

## 2014-02-11 ENCOUNTER — Encounter (HOSPITAL_COMMUNITY): Payer: Self-pay | Admitting: Emergency Medicine

## 2014-02-11 ENCOUNTER — Telehealth: Payer: Self-pay | Admitting: Family Medicine

## 2014-02-11 ENCOUNTER — Encounter: Payer: Self-pay | Admitting: Podiatrist

## 2014-02-11 ENCOUNTER — Emergency Department (HOSPITAL_COMMUNITY)
Admission: EM | Admit: 2014-02-11 | Discharge: 2014-02-12 | Disposition: A | Discharge status: Home / Self Care | Payer: PRIVATE HEALTH INSURANCE | Attending: Emergency Medicine | Admitting: Emergency Medicine

## 2014-02-11 ENCOUNTER — Ambulatory Visit (INDEPENDENT_AMBULATORY_CARE_PROVIDER_SITE_OTHER): Payer: Medicare Other | Admitting: Podiatrist

## 2014-02-11 VITALS — BP 167/77 | HR 63 | Resp 18 | Ht 65.0 in | Wt 249.0 lb

## 2014-02-11 DIAGNOSIS — K219 Gastro-esophageal reflux disease without esophagitis: Secondary | ICD-10-CM | POA: Insufficient documentation

## 2014-02-11 DIAGNOSIS — E785 Hyperlipidemia, unspecified: Secondary | ICD-10-CM | POA: Insufficient documentation

## 2014-02-11 DIAGNOSIS — Z981 Arthrodesis status: Secondary | ICD-10-CM | POA: Insufficient documentation

## 2014-02-11 DIAGNOSIS — Z8701 Personal history of pneumonia (recurrent): Secondary | ICD-10-CM | POA: Insufficient documentation

## 2014-02-11 DIAGNOSIS — Z872 Personal history of diseases of the skin and subcutaneous tissue: Secondary | ICD-10-CM | POA: Insufficient documentation

## 2014-02-11 DIAGNOSIS — M549 Dorsalgia, unspecified: Secondary | ICD-10-CM

## 2014-02-11 DIAGNOSIS — G8929 Other chronic pain: Secondary | ICD-10-CM | POA: Insufficient documentation

## 2014-02-11 DIAGNOSIS — F411 Generalized anxiety disorder: Secondary | ICD-10-CM | POA: Insufficient documentation

## 2014-02-11 DIAGNOSIS — Z79899 Other long term (current) drug therapy: Secondary | ICD-10-CM | POA: Insufficient documentation

## 2014-02-11 DIAGNOSIS — Z86711 Personal history of pulmonary embolism: Secondary | ICD-10-CM | POA: Insufficient documentation

## 2014-02-11 DIAGNOSIS — Z86718 Personal history of other venous thrombosis and embolism: Secondary | ICD-10-CM | POA: Insufficient documentation

## 2014-02-11 DIAGNOSIS — Z7901 Long term (current) use of anticoagulants: Secondary | ICD-10-CM | POA: Insufficient documentation

## 2014-02-11 DIAGNOSIS — B351 Tinea unguium: Secondary | ICD-10-CM

## 2014-02-11 DIAGNOSIS — J45909 Unspecified asthma, uncomplicated: Secondary | ICD-10-CM | POA: Insufficient documentation

## 2014-02-11 DIAGNOSIS — G4730 Sleep apnea, unspecified: Secondary | ICD-10-CM | POA: Insufficient documentation

## 2014-02-11 DIAGNOSIS — Z8739 Personal history of other diseases of the musculoskeletal system and connective tissue: Secondary | ICD-10-CM | POA: Insufficient documentation

## 2014-02-11 DIAGNOSIS — R011 Cardiac murmur, unspecified: Secondary | ICD-10-CM | POA: Insufficient documentation

## 2014-02-11 DIAGNOSIS — Z96659 Presence of unspecified artificial knee joint: Secondary | ICD-10-CM | POA: Insufficient documentation

## 2014-02-11 DIAGNOSIS — M79609 Pain in unspecified limb: Secondary | ICD-10-CM

## 2014-02-11 DIAGNOSIS — F209 Schizophrenia, unspecified: Secondary | ICD-10-CM | POA: Insufficient documentation

## 2014-02-11 DIAGNOSIS — I10 Essential (primary) hypertension: Secondary | ICD-10-CM | POA: Insufficient documentation

## 2014-02-11 DIAGNOSIS — Z862 Personal history of diseases of the blood and blood-forming organs and certain disorders involving the immune mechanism: Secondary | ICD-10-CM | POA: Insufficient documentation

## 2014-02-11 NOTE — Progress Notes (Signed)
   Subjective:    Patient ID: [name] SnowballDora C [name], female    DOB: 04/17/1941, 73 y.o.   MRN: [id number]  HPI Comments: Pt states she recently got out of the hospital for sickness and depression, and has not had her toenails trimmed.     Review of Systems  Neurological: Positive for light-headedness.  Psychiatric/Behavioral: Positive for behavioral problems. The patient is nervous/anxious.        Schizophrenia Depression Under [facility] care   All other systems reviewed and are negative.      Objective:   Physical Exam        Assessment & Plan:

## 2014-02-11 NOTE — Telephone Encounter (Signed)
Please advise.Thank you.Denise Bramblett S Nate Common  

## 2014-02-11 NOTE — Telephone Encounter (Signed)
Pt called and needs a refill on her Tramadol and she also said that she takes a pill for itching but can not remember the name and would like that one also. jw

## 2014-02-11 NOTE — ED Notes (Addendum)
Pt presents today with complaint of back pain that radiates down her legs. Pt reports a history of spinal fusion, neuropathy, and arthritis. Pt states that she stopped by her PCP's office today, however the provider was not in today and the patient was given an appointment for tomorrow. Pt reports the pain is unbearable and presents to ED for pain management. Pt also reports that she was recently admitted for psychiatric problems.  Pt is A/O x4, in NAD, and vitals are WDL.

## 2014-02-11 NOTE — ED Provider Notes (Signed)
CSN: [id number]     Arrival date & time 02/11/14  2234 History   First MD Initiated Contact with Patient 02/11/14 2310     Chief Complaint  Patient presents with  . Back Pain    HPI Comments: Patient is a 73 y.o. Female who presents to the [facility] ED for back pain x 1 day.  Patient states that she has had intermittent episodes of back pain for the past 15 years and states that this pain feels very similar.  Pain is described as a 10/10 pain that is similar to a toothache.  Patient has tried tylenol, advil, and gabapentin but has had little relief for her pain.  She has a remote spinal fusion in 2004, and a history of epidural injections.  She has not been able to get epidural injections as she is now on a blood thinner due to a DVT.  She denies weakness, falls, loss of balance, or loss of bowel or bladder.  She is supposed to meet with her orthopedist tomorrow morning but is requesting a pain medication that will allow her to make it to her appointment tomorrow.    Patient is a 73 y.o. female presenting with back pain.  Back Pain Associated symptoms: no fever     Past Medical History  Diagnosis Date  . Depression   . Schizophrenia   . Hyperlipidemia   . Hypertension   . Chronic pain     "over my whole body" (03/30/2013)  . 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 DVT (deep venous thrombosis)     per 01/17/10 d/c summary- "remote hx of dvt"?  . Iron deficiency anemia   . Glaucoma   . Hemorrhoids   . Asthma   . Anxiety   . GERD (gastroesophageal reflux disease)   . Psoriasis 04/29/2012    New onset evaluated by Dr Marylou Flesher, Dermatology, [facility] 6/13  Rx 0.1% Tacrolimus ointment  . Complication of anesthesia     "because I have sleep apnea" (03/30/2013)  . Heart murmur   . Chest pain, exertional   . Chronic bronchitis   . Sleep apnea     "waiting on my CPAP" (03/30/2013)  . Pneumonia     "a few times" (03/30/2013)  . Exertional shortness of breath     "&  sometimes when laying down" (03/30/2013)  . Daily headache     "last 2 months" (03/30/2013)  . Arthritis     "joints" (03/30/2013)  . Varicose veins of legs   . Psoriasis    Past Surgical History  Procedure Laterality Date  . Spinal fusion      2004  . Carpal tunnel release Bilateral   . Tubal ligation  1973  . Dilation and curettage of uterus  1970's    "once" (03/30/2013)  . 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 Joint replacement     Family History  Problem Relation Age of Onset  . Diabetes Sister   . Colon cancer Brother 40   History  Substance Use Topics  . Smoking status: Never Smoker   . Smokeless tobacco: Never Used  . Alcohol Use: No   OB History   Grav Para Term Preterm Abortions TAB SAB Ect Mult Living                 Review of Systems  Constitutional: Negative for fever, chills, appetite change and fatigue.  Musculoskeletal: Positive for arthralgias and back pain. Negative  for gait problem, neck pain and neck stiffness.  All other systems reviewed and are negative.     Allergies  Other  Home Medications   Prior to Admission medications   Medication Sig Start Date End Date Taking? Authorizing Provider  albuterol (PROVENTIL HFA;VENTOLIN HFA) 108 (90 BASE) MCG/ACT inhaler Inhale 2 puffs into the lungs every 4 (four) hours as needed for wheezing or shortness of breath. 10/21/13   Hayden Rasmussenavid Mabe, NP  atenolol (TENORMIN) 100 MG tablet Take 1 tablet (100 mg total) by mouth daily. 01/29/14   Hazeline Junkeryan Grunz, MD  atorvastatin (LIPITOR) 80 MG tablet Take 1 tablet (80 mg total) by mouth daily. 01/29/14   Hazeline Junkeryan Grunz, MD  esomeprazole (NEXIUM) 40 MG capsule Take 1 capsule (40 mg total) by mouth daily. 01/29/14   Hazeline Junkeryan Grunz, MD  FLUoxetine (PROZAC) 20 MG capsule Take 60 mg by mouth every morning.     Historical Provider, MD  FLUoxetine (PROZAC) 20 MG capsule Take 1 capsule (20 mg total) by mouth daily. 02/03/14   Shuvon Rankin, NP  fluticasone (FLOVENT HFA) 220 MCG/ACT inhaler Inhale 2  puffs into the lungs 3 (three) times daily.    Historical Provider, MD  gabapentin (NEURONTIN) 100 MG capsule Take 2 capsules (200 mg total) by mouth 3 (three) times daily. 01/29/14   Hazeline Junkeryan Grunz, MD  hydrochlorothiazide (HYDRODIURIL) 25 MG tablet Take 1 tablet (25 mg total) by mouth every morning. 01/29/14   Hazeline Junkeryan Grunz, MD  HYDROcodone-acetaminophen (NORCO) 10-325 MG per tablet Take 1 tablet by mouth 2 (two) times daily as needed for severe pain. 01/29/14   Hazeline Junkeryan Grunz, MD  hydrOXYzine (ATARAX/VISTARIL) 10 MG tablet Take 10 mg by mouth at bedtime.     Historical Provider, MD  lisinopril (PRINIVIL,ZESTRIL) 10 MG tablet Take 1 tablet (10 mg total) by mouth daily. 01/29/14   Hazeline Junkeryan Grunz, MD  PRESCRIPTION MEDICATION Apply 1 application topically daily. Silver sulfadiazine / triamcinolone cream= compounded by [facility] For psoriasis    Historical Provider, MD  sucralfate (CARAFATE) 1 G tablet Take 1 g by mouth 4 (four) times daily.    Historical Provider, MD  warfarin (COUMADIN) 5 MG tablet Take 1 tablet (5 mg total) by mouth daily. 01/28/14   Levert FeinsteinJames M Granfortuna, MD   BP 149/81  Pulse 61  Temp(Src) 98.2 F (36.8 C) (Oral)  Resp 22  SpO2 95%  LMP 11/22/1996 Physical Exam  Nursing note and vitals reviewed. Constitutional: She is oriented to person, place, and time. She appears well-developed and well-nourished. No distress.  HENT:  Head: Normocephalic and atraumatic.  Eyes: Conjunctivae are normal. Pupils are equal, round, and reactive to light. No scleral icterus.  Neck: Normal range of motion. Neck supple.  Cardiovascular: Normal rate, regular rhythm, normal heart sounds and intact distal pulses.  Exam reveals no gallop and no friction rub.   No murmur heard. Pulmonary/Chest: Effort normal and breath sounds normal. No respiratory distress. She has no wheezes. She has no rales. She exhibits no tenderness.  Musculoskeletal:  Patient rises slowly from seated to standing.  She walks without an antalgic gait.   There is a well healed old surgical scar over the lumbar spine.  There is tenderness to palpation of the right and left paraspinal muscles.  She has symmetric gross motor strength in all extremities.  DTRs are symmetric and even.  Sensation to light touch intact over all limbs.  Negative hoffman's sign.  Neurological: She is alert and oriented to person, place, and time. She has  normal reflexes. No cranial nerve deficit.  Skin: Skin is warm and dry. She is not diaphoretic.  Psychiatric: She has a normal mood and affect. Her behavior is normal. Judgment and thought content normal.    ED Course  Procedures (including critical care time) Labs Review Labs Reviewed - No data to display  Imaging Review No results found.   EKG Interpretation None      MDM   Final diagnoses:  Back pain   Back pain - Patient is scheduled for an appointment with her orthopedist tomorrow.  Her physical exam is not worrisome for cauda equina at this time.  We will treat her tonight with an IM dose of ketorolac and have discussed that she should come in if she experiences loss of her bowel or bladder.  Pain medication was offered to her to take home at this time but she rejected them.        Clydie Braunourtney A Forucci, PA-C 02/12/14 0031

## 2014-02-11 NOTE — ED Notes (Signed)
Pt c/o back and leg pain, pt needs pain control, was given an appointment with her doctor for tomorrow.

## 2014-02-12 ENCOUNTER — Ambulatory Visit (INDEPENDENT_AMBULATORY_CARE_PROVIDER_SITE_OTHER): Payer: PRIVATE HEALTH INSURANCE | Admitting: Family Medicine

## 2014-02-12 ENCOUNTER — Encounter: Payer: Self-pay | Admitting: Family Medicine

## 2014-02-12 VITALS — BP 148/93 | HR 58 | Temp 98.7°F | Ht 65.0 in | Wt 251.0 lb

## 2014-02-12 DIAGNOSIS — I83893 Varicose veins of bilateral lower extremities with other complications: Secondary | ICD-10-CM

## 2014-02-12 DIAGNOSIS — I83813 Varicose veins of bilateral lower extremities with pain: Secondary | ICD-10-CM

## 2014-02-12 DIAGNOSIS — M79606 Pain in leg, unspecified: Secondary | ICD-10-CM

## 2014-02-12 DIAGNOSIS — M549 Dorsalgia, unspecified: Secondary | ICD-10-CM

## 2014-02-12 DIAGNOSIS — M79609 Pain in unspecified limb: Secondary | ICD-10-CM

## 2014-02-12 MED ORDER — KETOROLAC TROMETHAMINE 60 MG/2ML IM SOLN
30.0000 mg | Freq: Once | INTRAMUSCULAR | Status: AC
  Administered 2014-02-12: 30 mg via INTRAMUSCULAR
  Filled 2014-02-12: qty 2

## 2014-02-12 MED ORDER — TRAMADOL HCL 50 MG PO TABS: 50.0000 mg | ORAL_TABLET | Freq: Three times a day (TID) | ORAL | Status: DC | PRN

## 2014-02-12 MED ORDER — CYCLOBENZAPRINE HCL 7.5 MG PO TABS: 7.5000 mg | ORAL_TABLET | Freq: Two times a day (BID) | ORAL | Status: DC | PRN

## 2014-02-12 NOTE — Progress Notes (Signed)
Family Medicine Office Visit Note   Subjective:   Patient ID: [name] [name], female  DOB: [birth year], 73 y.o.. MRN: [id number]   Pt that comes today for same day appointment complaining of pain on her legs. She went yesterday to ED for back pain and was treated withToradol and reports her pain is the same. It is localized in lower back with no radiation.  Denies saddle anesthesia, focalized weakness or incontinence. No fevers, chills, or other systemic symptoms. She is taking Gabapentin, Ibuprofen and acetaminophen for her pain but this seems to be insufficient. She has been treated with Vicodin in the past but refusses as this is too strong and also gives er "swelling legs". Has had steroid shots but discontinued after being on coumadin.  Her pain is located from her knees down bilaterally and with symmetric intensity. She uses compression stockings for her venous insufficiency and this also is helping with her swelling and pain per her report. Denies recent history of trauma or falls.  Review of Systems:  Per HPI  Objective:   Physical Exam: Gen: morbidly obese, NAD HEENT: Moist mucous membranes  CV: Regular rate and rhythm, no murmurs rubs or gallops PULM: Clear to auscultation bilaterally. No wheezes/rales/rhonchi MSK: Back - Normal skin, Spine with normal alignment and no deformity. No tenderness to vertebral process palpation. Paraspinous muscles are not tender and without spasm. Range of motion is limited due to pain.  Hips and Knees ROM is limited due to reported pain with active flexion/extension. Left knee with healed old scar present.  EXT: varicose veins present, only trace non pitting edema on ankles. Equal measurements in both LE. Neuro: Alert and oriented x3. No focalization  Assessment & Plan:

## 2014-02-12 NOTE — Assessment & Plan Note (Signed)
No signs of radiculitis or cauda equina syndrome. Treatment for leg pain may help as well.

## 2014-02-12 NOTE — Patient Instructions (Signed)
For your chronic issues you need to follow up with a primary doctor.  I have prescribe you medication that can help you but it is important that you schedule a visit with your doctor.

## 2014-02-12 NOTE — Discharge Instructions (Signed)
Back Pain, Adult  Back pain is very common. The pain often gets better over time. The cause of back pain is usually not dangerous. Most people can learn to manage their back pain on their own.   HOME CARE   · Stay active. Start with short walks on flat ground if you can. Try to walk farther each day.  · Do not sit, drive, or stand in one place for more than 30 minutes. Do not stay in bed.  · Do not avoid exercise or work. Activity can help your back heal faster.  · Be careful when you bend or lift an object. Bend at your knees, keep the object close to you, and do not twist.  · Sleep on a firm mattress. Lie on your side, and bend your knees. If you lie on your back, put a pillow under your knees.  · Only take medicines as told by your doctor.  · Put ice on the injured area.  · Put ice in a plastic bag.  · Place a towel between your skin and the bag.  · Leave the ice on for 15-20 minutes, 03-04 times a day for the first 2 to 3 days. After that, you can switch between ice and heat packs.  · Ask your doctor about back exercises or massage.  · Avoid feeling anxious or stressed. Find good ways to deal with stress, such as exercise.  GET HELP RIGHT AWAY IF:   · Your pain does not go away with rest or medicine.  · Your pain does not go away in 1 week.  · You have new problems.  · You do not feel well.  · The pain spreads into your legs.  · You cannot control when you poop (bowel movement) or pee (urinate).  · Your arms or legs feel weak or lose feeling (numbness).  · You feel sick to your stomach (nauseous) or throw up (vomit).  · You have belly (abdominal) pain.  · You feel like you may pass out (faint).  MAKE SURE YOU:   · Understand these instructions.  · Will watch your condition.  · Will get help right away if you are not doing well or get worse.  Document Released: 02/27/2008 Document Revised: 12/03/2011 Document Reviewed: 01/29/2011  ExitCare® Patient Information ©2014 [employer].

## 2014-02-12 NOTE — Assessment & Plan Note (Signed)
Neuropathic vs venous insufficiency vs arthritic pain. DVT less likely since is bilateral and in the absence of other findings Pt is also on coumadin.  There is room to increase her gabapentin but pt declines. Discussed therapy options and she agrees with adding tramadol for her pain as break through and continue using NSAID's tylenol  Scheduled. Recommended to f/u with primary doctor .

## 2014-02-12 NOTE — Assessment & Plan Note (Signed)
Continue use of compression stockings. Leg elevation.

## 2014-02-13 MED ORDER — HYDROXYZINE HCL 10 MG PO TABS: 10.0000 mg | ORAL_TABLET | Freq: Every day | ORAL | Status: DC

## 2014-02-13 NOTE — Progress Notes (Signed)
Chief Complaint  Patient presents with  . debridement    "I let my toenails get too long."  1 - 10 toenails     HPI: Patient is 73 y.o. female who presents today for toenail debridement. She relates it has been a while since she has had the opportunity to get her nails trimmed due to being in the hospital and taking care of her son kwame whom I also see for routine care.      Physical Exam Patient is awake, alert, and oriented x 3.  In no acute distress.  Vascular status is intact with palpable pedal pulses at 2/4 DP and PT bilateral and capillary refill time within normal limits. Neurological sensation is also intact bilaterally via Semmes Weinstein monofilament at 5/5 sites. Light touch, vibratory sensation, Achilles tendon reflex is intact. Dermatological exam reveals skin color, turger and texture as normal. No open lesions present.  Musculature intact with dorsiflexion, plantarflexion, inversion, eversion. Toenails are thick, discolored, dystrophic and clinically mycotic bilateral.   Assessment: painful mycotic toenails bilaterl  Plan:debridement carried out today without complication.  She will be seen back in 3 months or as needed for follow up.

## 2014-02-13 NOTE — Telephone Encounter (Signed)
Put in refill for Hydroxyzine. Medication not advised for patient due to age but will continue since was recently prescribed. Patient will need to follow-up with PCP. Will not refill Tramadol since she got new prescription at recent office visit.

## 2014-02-16 NOTE — Telephone Encounter (Signed)
Patient informed ,she states she has an appointment coming up and will discuss tramadol.she states it's not helping.Amedeo Gory S [place]

## 2014-02-16 NOTE — ED Provider Notes (Signed)
Medical screening examination/treatment/procedure(s) were conducted as a shared visit with non-physician practitioner(s) and myself.  I personally evaluated the patient during the encounter.   EKG Interpretation None      patient with acute on chronic back pain. Requesting toradol injection to get her through the night. Neurologically intact, no red flags. Will give pain control and recommend close f/u with ortho as scheduled.  Audree Camel, MD 02/16/14 [phone]

## 2014-02-23 ENCOUNTER — Emergency Department (INDEPENDENT_AMBULATORY_CARE_PROVIDER_SITE_OTHER)
Admission: EM | Admit: 2014-02-23 | Discharge: 2014-02-23 | Disposition: A | Discharge status: Home / Self Care | Payer: PRIVATE HEALTH INSURANCE | Source: Home / Self Care | Attending: Family Medicine | Admitting: Family Medicine

## 2014-02-23 ENCOUNTER — Encounter (HOSPITAL_COMMUNITY): Payer: Self-pay | Admitting: Emergency Medicine

## 2014-02-23 DIAGNOSIS — I872 Venous insufficiency (chronic) (peripheral): Secondary | ICD-10-CM

## 2014-02-23 DIAGNOSIS — I8310 Varicose veins of unspecified lower extremity with inflammation: Secondary | ICD-10-CM

## 2014-02-23 MED ORDER — TRIAMCINOLONE ACETONIDE 0.025 % EX OINT: 1.0000 "application " | TOPICAL_OINTMENT | Freq: Two times a day (BID) | CUTANEOUS | Status: DC

## 2014-02-23 MED ORDER — SILVER SULFADIAZINE 1 % EX CREA: 1.0000 "application " | TOPICAL_CREAM | Freq: Every day | CUTANEOUS | Status: DC

## 2014-02-23 NOTE — ED Provider Notes (Signed)
CSN: [id number]     Arrival date & time 02/23/14  1224 History   First MD Initiated Contact with Patient 02/23/14 1420     Chief Complaint  Patient presents with  . Leg Pain   (Consider location/radiation/quality/duration/timing/severity/associated sxs/prior Treatment) HPI Comments: Patient reports she has run out of the topical preparation she typically uses for the lower extremity pruritis caused by her chronic stasis dermatitis and she cannot see her dermatologist in [facility] for a refill until 03-21-2014. She is here requesting refill RX.   Patient is a 73 y.o. female presenting with leg pain. The history is provided by the patient.  Leg Pain   Past Medical History  Diagnosis Date  . Depression   . Schizophrenia   . Hyperlipidemia   . Hypertension   . Chronic pain     "over my whole body" (03/30/2013)  . 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 DVT (deep venous thrombosis)     per 01/17/10 d/c summary- "remote hx of dvt"?  . Iron deficiency anemia   . Glaucoma   . Hemorrhoids   . Asthma   . Anxiety   . GERD (gastroesophageal reflux disease)   . Psoriasis 04/29/2012    New onset evaluated by Dr Marylou Flesher, Dermatology, [facility] 6/13  Rx 0.1% Tacrolimus ointment  . Complication of anesthesia     "because I have sleep apnea" (03/30/2013)  . Heart murmur   . Chest pain, exertional   . Chronic bronchitis   . Sleep apnea     "waiting on my CPAP" (03/30/2013)  . Pneumonia     "a few times" (03/30/2013)  . Exertional shortness of breath     "& sometimes when laying down" (03/30/2013)  . Daily headache     "last 2 months" (03/30/2013)  . Arthritis     "joints" (03/30/2013)  . Varicose veins of legs   . Psoriasis    Past Surgical History  Procedure Laterality Date  . Spinal fusion      2004  . Carpal tunnel release Bilateral   . Tubal ligation  1973  . Dilation and curettage of uterus  1970's    "once" (03/30/2013)  . Total knee arthroplasty Right  11/2008  . Joint replacement     Family History  Problem Relation Age of Onset  . Diabetes Sister   . Colon cancer Brother 40   History  Substance Use Topics  . Smoking status: Never Smoker   . Smokeless tobacco: Never Used  . Alcohol Use: No   OB History   Grav Para Term Preterm Abortions TAB SAB Ect Mult Living                 Review of Systems  All other systems reviewed and are negative.   Allergies  Other  Home Medications   Prior to Admission medications   Medication Sig Start Date End Date Taking? Authorizing Provider  albuterol (PROVENTIL HFA;VENTOLIN HFA) 108 (90 BASE) MCG/ACT inhaler Inhale 2 puffs into the lungs every 4 (four) hours as needed for wheezing or shortness of breath. 10/21/13  Yes Hayden Rasmussen, NP  atenolol (TENORMIN) 100 MG tablet Take 1 tablet (100 mg total) by mouth daily. 01/29/14  Yes [name], MD  atorvastatin (LIPITOR) 80 MG tablet Take 1 tablet (80 mg total) by mouth daily. 01/29/14  Yes [name], MD  cyclobenzaprine (FEXMID) 7.5 MG tablet Take 1 tablet (7.5 mg total) by mouth 2 (two)  times daily as needed for muscle spasms. 02/12/14  Yes Dayarmys Pil[place] de Cris[place]da PeachesLa Paz, MD  esomeprazole (NEXIUM) 40 MG capsule Take 1 capsule (40 mg total) by mouth daily. 01/29/14  Yes Hazeline Junkeryan Grunz, MD  fluticasone (FLOVENT HFA) 220 MCG/ACT inhaler Inhale 2 puffs into the lungs 3 (three) times daily.   Yes Historical Provider, MD  gabapentin (NEURONTIN) 100 MG capsule Take 200 mg by mouth 3 (three) times daily.   Yes Historical Provider, MD  hydrochlorothiazide (HYDRODIURIL) 25 MG tablet Take 1 tablet (25 mg total) by mouth every morning. 01/29/14  Yes Hazeline Junkeryan Grunz, MD  hydrOXYzine (ATARAX/VISTARIL) 10 MG tablet Take 1 tablet (10 mg total) by mouth at bedtime. 02/13/14  Yes Jacquelin Hawkingalph Nettey, MD  lisinopril (PRINIVIL,ZESTRIL) 10 MG tablet Take 1 tablet (10 mg total) by mouth daily. 01/29/14  Yes Hazeline Junkeryan Grunz, MD  PRESCRIPTION MEDICATION Apply 1 application topically daily. Silver sulfadiazine  / triamcinolone cream= compounded by [facility] For psoriasis   Yes Historical Provider, MD  sucralfate (CARAFATE) 1 G tablet Take 1 g by mouth 4 (four) times daily.   Yes Historical Provider, MD  traMADol (ULTRAM) 50 MG tablet Take 1 tablet (50 mg total) by mouth every 8 (eight) hours as needed. 02/12/14  Yes Dayarmys [place]da Peaches[place], MD  warfarin (COUMADIN) 5 MG tablet Take 1 tablet (5 mg total) by mouth daily. 01/28/14  Yes Levert FeinsteinJames M Granfortuna, MD  silver sulfADIAZINE (SILVADENE) 1 % cream Apply 1 application topically daily. 02/23/14   Ardis RowanJennifer Lee [place]o [place], PA  triamcinolone (KENALOG) 0.025 % ointment Apply 1 application topically 2 (two) times daily. To affected areas 02/23/14   Jess BartersJennifer Lee Archer [place], PA   BP 182/100  Pulse 74  Temp(Src) 98.5 F (36.9 C) (Oral)  Resp 20  SpO2 98%  LMP 11/22/1996 Physical Exam  Nursing note reviewed. Constitutional: She is oriented to person, place, and time. She appears well-developed and well-nourished.  +obese  HENT:  Head: Normocephalic and atraumatic.  Eyes: Conjunctivae are normal. No scleral icterus.  Pulmonary/Chest: Effort normal.  Musculoskeletal: Normal range of motion.  Neurological: She is alert and oriented to person, place, and time.  Skin: Skin is warm and dry.  Chronic bilateral lower extremity stasis dermatitis (L>R). No additional skin changes or cellulitis.   Psychiatric: She has a normal mood and affect. Her behavior is normal.    ED Course  Procedures (including critical care time) Labs Review Labs Reviewed - No data to display  Imaging Review No results found.   MDM   1. Stasis dermatitis    Refilled requested medication and advised follow up with her dermatologist.    Ardis RowanJennifer Lee Berish Bohman, PA 02/23/14 [phone]

## 2014-02-23 NOTE — ED Notes (Signed)
C/o leg pain and itching.  Pt states that she has been with out med x 3 wks that was prescribed by dermatologist.  Silver sulfadi/triamcinolone cream.  States has an appointment coming up this month.

## 2014-02-23 NOTE — Discharge Instructions (Signed)
Stasis Dermatitis °Stasis dermatitis occurs when veins lose the ability to pump blood back to the heart (poor venous circulation). It causes a reddish-purple to brownish scaly, itchy rash on the legs. The rash comes from pooling of blood (stasis). °CAUSES  °This occurs because the veins do not work very well anymore or because pressure may be increased in the veins due to other conditions. With blood pooling, the increased pressure in the tiny blood vessels (capillaries) causes fluid to leak out of the capillaries into the tissue. The extra fluid makes it harder for the blood to feed the cells and get rid of waste products. °SYMPTOMS  °Stasis dermatitis appears as red, scaly, itchy patches on the legs. A yellowish or light brown discoloration is also present. Due to scratching or other injury, these patches can become an ulcer. This ulcer may remain for long periods of time. The ulcer can also become infected. Swelling of the legs is often present with stasis dermatitis. If the leg is swollen, this increases the risk of infection and further damage to the skin. Sometimes, intense itching, tingling, and burning occurs before signs of stasis dermatitis appear. You may find yourself scratching the insides of your ankles or rubbing your ankles together before the rash appears. After healing, there are often brown spots on the affected skin. °DIAGNOSIS  °Your caregiver makes this diagnosis based on an exam. Other tests may be done to better understand the cause. °TREATMENT  °If underlying conditions are present, they must be treated. Some of these conditions are heart failure, thyroid problems, poor nutrition, and varicose veins. °· Cortisone creams and ointments applied to the skin (topically) may be needed, as well as medicine to reduce swelling in the legs (diuretics). °· Compression stockings or an elastic wrap may also be needed to reduce swelling. °· If there is an infection, antibiotic medicines may also be  used. °HOME CARE INSTRUCTIONS  °· Try to rest and raise (elevate) the affected leg above the level of the heart, if possible. °· Burow's solution wet packs applied for 30 minutes, 3 times daily, will help the weepy rash. Stop using the packs before your skin gets too dry. You can also use a mixture of 3 parts white vinegar to 1 quart water. °· Grease your legs daily with ointments, such as petroleum jelly, to fight dryness. °· Avoid scratching or injuring the area. °SEEK IMMEDIATE MEDICAL CARE IF:  °· Your rash gets worse. °· An ulcer forms. °· You have an oral temperature above 102° F (38.9° C), not controlled by medicine. °· You have any other severe symptoms. °Document Released: 12/20/2005 Document Revised: 12/03/2011 Document Reviewed: 01/30/2010 °ExitCare® Patient Information ©2014 [employer]. ° °

## 2014-02-24 ENCOUNTER — Telehealth: Payer: Self-pay | Admitting: Home Health Services

## 2014-02-24 ENCOUNTER — Encounter: Payer: Self-pay | Admitting: Pharmacist

## 2014-02-24 DIAGNOSIS — I10 Essential (primary) hypertension: Secondary | ICD-10-CM

## 2014-02-24 NOTE — Progress Notes (Signed)
Pt has recently missed coming to Coumadin clinic again.  I noticed she was in the ED again yesterday w/ leg pain (derm related; needed refill on her cream).  No INR drawn yesterday in ED. I reached out to [employer] to refer [name] for their services.  I s/w Nena Polio, care manager assistant at [facility].  Misty Stanley feels sure they can help [name] [name] & they will contact her within the next 10 days.  Hopefully they will be able to take time with [name] [name] to review her current situation & cut down on her visits to the ED. I called pt & she is excited to hear that there is someone like [facility] to help her.  She really has been thinking recently of stopping all of her MD visits completely. With regards to her Coumadin, we will continue to monitor here at [facility] and she'll come this Fri 6/5 at 11 am for lab; 11:15 am for Coumadin clinic. Ebony Hail, Pharm.D., CPP 02/24/2014@10 :56 AM

## 2014-02-24 NOTE — Telephone Encounter (Signed)
Pt has had 17 ED visits in the past 6 months.  Refer to [facility] care management for support.

## 2014-02-25 NOTE — ED Provider Notes (Signed)
Medical screening examination/treatment/procedure(s) were performed by resident physician or non-physician practitioner and as supervising physician I was immediately available for consultation/collaboration.   KINDL,JAMES DOUGLAS MD.   James D Kindl, MD 02/25/14 1819 

## 2014-02-26 ENCOUNTER — Other Ambulatory Visit (HOSPITAL_BASED_OUTPATIENT_CLINIC_OR_DEPARTMENT_OTHER): Payer: PRIVATE HEALTH INSURANCE

## 2014-02-26 ENCOUNTER — Ambulatory Visit (HOSPITAL_BASED_OUTPATIENT_CLINIC_OR_DEPARTMENT_OTHER): Payer: PRIVATE HEALTH INSURANCE | Admitting: Pharmacist

## 2014-02-26 DIAGNOSIS — D6851 Activated protein C resistance: Secondary | ICD-10-CM

## 2014-02-26 DIAGNOSIS — Z7901 Long term (current) use of anticoagulants: Secondary | ICD-10-CM

## 2014-02-26 DIAGNOSIS — I2699 Other pulmonary embolism without acute cor pulmonale: Secondary | ICD-10-CM

## 2014-02-26 DIAGNOSIS — D6859 Other primary thrombophilia: Secondary | ICD-10-CM

## 2014-02-26 LAB — PROTIME-INR
INR: 2 (ref 2.00–3.50)
PROTIME: 24 s — AB (ref 10.6–13.4)

## 2014-02-26 LAB — POCT INR: INR: 2

## 2014-02-26 NOTE — Patient Instructions (Signed)
Continue 5mg  daily. Recheck INR in 5 weeks on 03/30/14: Lab at 11:15am and coumadin clinic at 11:30am.

## 2014-02-26 NOTE — Progress Notes (Signed)
INR within goal today. Pt doing well and has no concerns or problems regarding anticoagulation. She was recently seen in the ED to have her skin cream prescription refilled. She has an appointment with her dermatologist on 03/01/14 to be seen and get her skin cream prescriptions refilled. Pt stated that someone from [facility] is coming to her home on 03/02/14. No missed coumadin doses. No changes in diet. Pt thinks she is eating too much. Updated medication list. Removed Tizanidine, Meloxicam and Omega 3/Fish Oil and Lipitor No s/s of clotting noted. Pt may have a dental procedure done in the next few months to build up the bone in her lower jaw. She will let us know when this is scheduled. She is aware that we would like about a 2 week notice to develop her anticoagulation plan. Continue 5mg  daily.  Recheck INR in 5 weeks on 03/30/14: Lab at 11:15am and coumadin clinic at 11:30am.

## 2014-03-01 ENCOUNTER — Telehealth: Payer: Self-pay | Admitting: Family Medicine

## 2014-03-01 NOTE — Telephone Encounter (Signed)
Pt called and needs a refill on her Tramadol left up front for pickup. She has an appointment in July since that is all that is available. jw

## 2014-03-08 ENCOUNTER — Ambulatory Visit (INDEPENDENT_AMBULATORY_CARE_PROVIDER_SITE_OTHER): Payer: PRIVATE HEALTH INSURANCE | Admitting: Family Medicine

## 2014-03-08 ENCOUNTER — Encounter: Payer: Self-pay | Admitting: Family Medicine

## 2014-03-08 VITALS — BP 133/81 | HR 71 | Ht 65.0 in | Wt 254.0 lb

## 2014-03-08 DIAGNOSIS — G8929 Other chronic pain: Secondary | ICD-10-CM

## 2014-03-08 DIAGNOSIS — M79606 Pain in leg, unspecified: Secondary | ICD-10-CM

## 2014-03-08 DIAGNOSIS — R42 Dizziness and giddiness: Secondary | ICD-10-CM

## 2014-03-08 DIAGNOSIS — M79609 Pain in unspecified limb: Secondary | ICD-10-CM

## 2014-03-08 MED ORDER — TRAMADOL HCL 50 MG PO TABS: 100.0000 mg | ORAL_TABLET | Freq: Three times a day (TID) | ORAL | Status: DC | PRN

## 2014-03-08 MED ORDER — TRAMADOL HCL 50 MG PO TABS: 50.0000 mg | ORAL_TABLET | Freq: Three times a day (TID) | ORAL | Status: DC | PRN

## 2014-03-08 MED ORDER — GABAPENTIN 100 MG PO CAPS: 200.0000 mg | ORAL_CAPSULE | Freq: Three times a day (TID) | ORAL | Status: DC

## 2014-03-08 MED ORDER — MECLIZINE HCL 25 MG PO TABS: 25.0000 mg | ORAL_TABLET | Freq: Three times a day (TID) | ORAL | Status: DC | PRN

## 2014-03-08 NOTE — Patient Instructions (Signed)
[name] [name], it was a pleasure seeing you today. Today we talked about your pain. I have refilled your Tramadol and Gabapentin. We also talked about your vertigo. I am refilling a few pills for this since you do not take this often. I also don't think it is the best medication for you to have long term.  Please schedule an appointment to see Dr. Jarvis NewcomerGrunz in 4 weeks or as needed.  If you have any questions or concerns, please do not hesitate to call the office at [phone].  Sincerely,  Jacquelin Hawkingalph Yamil Oelke, MD

## 2014-03-08 NOTE — Assessment & Plan Note (Signed)
Says pain controlled with Tramadol 100mg  TID. This is twice as much as she is prescribed. She brought in an empty pill bottle of Norco and has only been taking Tramadol. Will prescribe Tramadol 1-2 pills TID for pain with follow-up with PCP. Also refilled gabapentin today.

## 2014-03-08 NOTE — Assessment & Plan Note (Signed)
Medication refill. Given only 15 pills.

## 2014-03-08 NOTE — Progress Notes (Signed)
   Subjective:    Patient ID: [name] SnowballDora C Chaddock, female    DOB: 04/24/1941, 73 y.o.   MRN: [id number]  HPI  Patient presents for chronic pain follow-up and medication refill. She is currently prescribed Tramadol 50mg  1 TID as needed. She has been taking 2 pills TID, which helps. She reports no side effects with the medication. Pain is mainly in her back and knees and affects her ability to walk. Patient has vertigo controlled on meclizine and is currently not having symptoms.  Review of Systems  Musculoskeletal: Positive for arthralgias, back pain and gait problem.  Neurological: Positive for dizziness. Negative for syncope.  All other systems reviewed and are negative.      Objective:   Physical Exam  Constitutional: She is oriented to person, place, and time. She appears well-developed and well-nourished.  Musculoskeletal: Normal range of motion.       Right knee: No tenderness found.       Left knee: No tenderness found.       Right lower leg: She exhibits tenderness and edema.       Left lower leg: She exhibits tenderness and edema.  Neurological: She is alert and oriented to person, place, and time.  Skin: Skin is warm and dry.       Assessment & Plan:

## 2014-03-22 ENCOUNTER — Ambulatory Visit (INDEPENDENT_AMBULATORY_CARE_PROVIDER_SITE_OTHER): Payer: PRIVATE HEALTH INSURANCE | Admitting: Family Medicine

## 2014-03-22 ENCOUNTER — Encounter: Payer: Self-pay | Admitting: Family Medicine

## 2014-03-22 VITALS — BP 153/83 | HR 74 | Temp 98.1°F | Wt 253.7 lb

## 2014-03-22 DIAGNOSIS — M545 Low back pain, unspecified: Secondary | ICD-10-CM

## 2014-03-22 MED ORDER — TRAMADOL HCL 50 MG PO TABS: 50.0000 mg | ORAL_TABLET | Freq: Three times a day (TID) | ORAL | Status: DC | PRN

## 2014-03-22 NOTE — Assessment & Plan Note (Signed)
Chronic back pain, no red flags for cauda equina Explained foundation of Tx is PT, ordered today Refilled tramadol Recommended cont 100 mg gabapentin TID, inc in dose would be ok but she noticed no imp F/u PCP in 2-4 week

## 2014-03-22 NOTE — Progress Notes (Signed)
Patient ID: [name] [name], female   DOB: Apr 27, 1941, 73 y.o.   MRN: [id number]  Kevin Fenton, MD Phone: [phone]  Subjective:  Chief complaint-noted  Pt Here for chronic back pain  Patient states that she's been suffering with back pain which is unchanged for about 6 years. She describes it as low back pain radiating up into her neck and upper shoulders radiating outward. She denies bowel or bladder dysfunction, saddle anesthesia, and lower extremity weakness.  She denies any recent falls but states that her ankles have began to give way.  She's been using 2 tramadol 3 times a day with some improvement and would like a refill on that. She used 3 gabapentin 3 times yesterday and states that it helps minimally more than one 3 times a day.  She was prescribed tramadol #90 pills 2 weeks ago, which should be out that she is taking 6 pills daily. She's describing taking 6 pills a day.  She states that she went to [facility] earlier this year and an MRI which did not show any acute surgical intervention needed. I reviewed care everywhere and could not find the evidence of this MRI. However she was seen on 01/14/2014 and requested Vicodin from the ER at [facility].  ROS-  History of present illness plus Back pain No dyspnea or chest pain Positive stable leg paresthesias  Past Medical History Patient Active Problem List   Diagnosis Date Noted  . Suicidal ideation 02/03/2014  . Homicidal ideation 02/03/2014  . MDD (major depressive disorder), single episode, severe 02/03/2014  . Leg pain 12/15/2013  . Back pain 06/09/2013  . Vertigo 05/31/2012  . Orthostatic hypotension 05/31/2012  . Psoriasis 04/29/2012  . Bradycardia 12/12/2011  . Chronic anticoagulation 12/11/2011  . Pre-syncope 12/11/2011  . Heterozygous factor V Leiden mutation 01/18/2011  . Anemia, normocytic normochromic 01/18/2011  . GERD 12/21/2010  . Family history of malignant neoplasm of gastrointestinal tract  12/21/2010  . Iron deficiency anemia, unspecified 12/21/2010  . Varicose veins of both lower extremities with pain 12/15/2010  . Encounter for long-term (current) use of anticoagulants 11/30/2010  . Lung nodule 11/12/2010  . CPK, ABNORMAL 06/18/2010  . PULMONARY EMBOLISM 01/30/2010  . HYPERLIPIDEMIA 01/10/2010  . 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 OBESITY, UNSPECIFIED 09/26/2009  . SCHIZOPHRENIA 09/26/2009  . DEPRESSION 09/26/2009  . Other chronic pain 09/26/2009  . Essential hypertension, benign 09/26/2009    Medications- reviewed and updated Current Outpatient Prescriptions  Medication Sig Dispense Refill  . albuterol (PROVENTIL HFA;VENTOLIN HFA) 108 (90 BASE) MCG/ACT inhaler Inhale 2 puffs into the lungs every 4 (four) hours as needed for wheezing or shortness of breath.  1 Inhaler  0  . atenolol (TENORMIN) 100 MG tablet Take 1 tablet (100 mg total) by mouth daily.  30 tablet  11  . esomeprazole (NEXIUM) 40 MG capsule Take 1 capsule (40 mg total) by mouth daily.  30 capsule  11  . FLUoxetine (PROZAC) 20 MG capsule Take 20 mg by mouth 3 (three) times daily.      . fluticasone (FLOVENT HFA) 220 MCG/ACT inhaler Inhale 2 puffs into the lungs 3 (three) times daily.      Marland Kitchen gabapentin (NEURONTIN) 100 MG capsule Take 2 capsules (200 mg total) by mouth 3 (three) times daily.  180 capsule  0  . hydrochlorothiazide (HYDRODIURIL) 25 MG tablet Take 1 tablet (25 mg total) by mouth every morning.  30 tablet  11  . hydrOXYzine (ATARAX/VISTARIL) 10 MG tablet Take 1 tablet (  10 mg total) by mouth at bedtime.  30 tablet  0  . lisinopril (PRINIVIL,ZESTRIL) 10 MG tablet Take 1 tablet (10 mg total) by mouth daily.  30 tablet  11  . meclizine (ANTIVERT) 25 MG tablet Take 1 tablet (25 mg total) by mouth 3 (three) times daily as needed for dizziness.  15 tablet  0  . sucralfate (CARAFATE) 1 G tablet Take 1 g by mouth 4 (four) times daily.      Marland Kitchen warfarin (COUMADIN) 5 MG tablet Take 1 tablet (5 mg total) by  mouth daily.  30 tablet  4  . PRESCRIPTION MEDICATION Apply 1 application topically daily. Silver sulfadiazine 1% / triamcinolone cream 0.1%= compounded by [facility] For psoriasis      . silver sulfADIAZINE (SILVADENE) 1 % cream Apply 1 application topically daily.  50 g  0  . traMADol (ULTRAM) 50 MG tablet Take 1-2 tablets (50-100 mg total) by mouth every 8 (eight) hours as needed.  180 tablet  0  . triamcinolone (KENALOG) 0.025 % ointment Apply 1 application topically 2 (two) times daily. To affected areas  30 g  0   No current facility-administered medications for this visit.    Objective: BP 153/83  Pulse 74  Temp(Src) 98.1 F (36.7 C) (Oral)  Wt 253 lb 11.2 oz (115.078 kg)  LMP 11/22/1996 Gen: NAD, alert, cooperative with exam HEENT: NCAT, EOMI, PERRL CV: RRR, good S1/S2, no murmur Resp: CTABL, no wheezes, non-labored Neuro: Alert and oriented, strength 5/5 in bilateral lower extremities, normal gait with a cane observed MSK: No tenderness to palpation of spine from cervical to lumbar, generalized tenderness of her low back,   Assessment/Plan:  Back pain Chronic back pain, no red flags for cauda equina Explained foundation of Tx is PT, ordered today Refilled tramadol Recommended cont 100 mg gabapentin TID, inc in dose would be ok but she noticed no imp F/u PCP in 2-4 week   Orders Placed This Encounter  Procedures  . Ambulatory referral to Physical Therapy    Referral Priority:  Routine    Referral Type:  Physical Medicine    Referral Reason:  Specialty Services Required    Requested Specialty:  Physical Therapy    Number of Visits Requested:  1    Meds ordered this encounter  Medications  . FLUoxetine (PROZAC) 20 MG capsule    Sig: Take 20 mg by mouth 3 (three) times daily.  . traMADol (ULTRAM) 50 MG tablet    Sig: Take 1-2 tablets (50-100 mg total) by mouth every 8 (eight) hours as needed.    Dispense:  180 tablet    Refill:  0

## 2014-03-22 NOTE — Patient Instructions (Signed)
Great to meet you!  You have a challenging back situation, Tramadol is ok to use but you need physical therapy, you will be called for ana appt.   Come back to see dr. Jarvis [name] in the next 2-4 weeks  We will not refill your tramadol in less than a month and you will need a prescription.

## 2014-03-25 ENCOUNTER — Emergency Department (HOSPITAL_COMMUNITY)
Admission: EM | Admit: 2014-03-25 | Discharge: 2014-03-25 | Disposition: A | Discharge status: Home / Self Care | Payer: PRIVATE HEALTH INSURANCE | Attending: Emergency Medicine | Admitting: Emergency Medicine

## 2014-03-25 ENCOUNTER — Encounter (HOSPITAL_COMMUNITY): Payer: Self-pay | Admitting: Emergency Medicine

## 2014-03-25 ENCOUNTER — Other Ambulatory Visit: Payer: Self-pay

## 2014-03-25 DIAGNOSIS — Z9981 Dependence on supplemental oxygen: Secondary | ICD-10-CM | POA: Diagnosis not present

## 2014-03-25 DIAGNOSIS — Z792 Long term (current) use of antibiotics: Secondary | ICD-10-CM | POA: Insufficient documentation

## 2014-03-25 DIAGNOSIS — F3289 Other specified depressive episodes: Secondary | ICD-10-CM | POA: Insufficient documentation

## 2014-03-25 DIAGNOSIS — Z86711 Personal history of pulmonary embolism: Secondary | ICD-10-CM | POA: Diagnosis not present

## 2014-03-25 DIAGNOSIS — IMO0002 Reserved for concepts with insufficient information to code with codable children: Secondary | ICD-10-CM | POA: Diagnosis not present

## 2014-03-25 DIAGNOSIS — F329 Major depressive disorder, single episode, unspecified: Secondary | ICD-10-CM | POA: Insufficient documentation

## 2014-03-25 DIAGNOSIS — Z862 Personal history of diseases of the blood and blood-forming organs and certain disorders involving the immune mechanism: Secondary | ICD-10-CM | POA: Insufficient documentation

## 2014-03-25 DIAGNOSIS — Z8739 Personal history of other diseases of the musculoskeletal system and connective tissue: Secondary | ICD-10-CM | POA: Diagnosis not present

## 2014-03-25 DIAGNOSIS — Z7901 Long term (current) use of anticoagulants: Secondary | ICD-10-CM | POA: Insufficient documentation

## 2014-03-25 DIAGNOSIS — I10 Essential (primary) hypertension: Secondary | ICD-10-CM | POA: Insufficient documentation

## 2014-03-25 DIAGNOSIS — K219 Gastro-esophageal reflux disease without esophagitis: Secondary | ICD-10-CM | POA: Insufficient documentation

## 2014-03-25 DIAGNOSIS — R011 Cardiac murmur, unspecified: Secondary | ICD-10-CM | POA: Insufficient documentation

## 2014-03-25 DIAGNOSIS — F411 Generalized anxiety disorder: Secondary | ICD-10-CM | POA: Insufficient documentation

## 2014-03-25 DIAGNOSIS — R002 Palpitations: Secondary | ICD-10-CM | POA: Diagnosis not present

## 2014-03-25 DIAGNOSIS — Z8659 Personal history of other mental and behavioral disorders: Secondary | ICD-10-CM | POA: Insufficient documentation

## 2014-03-25 DIAGNOSIS — Z872 Personal history of diseases of the skin and subcutaneous tissue: Secondary | ICD-10-CM | POA: Insufficient documentation

## 2014-03-25 DIAGNOSIS — E669 Obesity, unspecified: Secondary | ICD-10-CM | POA: Diagnosis not present

## 2014-03-25 DIAGNOSIS — Z86718 Personal history of other venous thrombosis and embolism: Secondary | ICD-10-CM | POA: Insufficient documentation

## 2014-03-25 DIAGNOSIS — Z79899 Other long term (current) drug therapy: Secondary | ICD-10-CM | POA: Insufficient documentation

## 2014-03-25 DIAGNOSIS — G4730 Sleep apnea, unspecified: Secondary | ICD-10-CM | POA: Diagnosis not present

## 2014-03-25 DIAGNOSIS — J45909 Unspecified asthma, uncomplicated: Secondary | ICD-10-CM | POA: Insufficient documentation

## 2014-03-25 DIAGNOSIS — G8929 Other chronic pain: Secondary | ICD-10-CM | POA: Diagnosis not present

## 2014-03-25 DIAGNOSIS — I499 Cardiac arrhythmia, unspecified: Secondary | ICD-10-CM | POA: Diagnosis present

## 2014-03-25 LAB — I-STAT TROPONIN, ED: Troponin i, poc: 0 ng/mL (ref 0.00–0.08)

## 2014-03-25 LAB — CBC
HCT: 33.9 % — ABNORMAL LOW (ref 36.0–46.0)
Hemoglobin: 11.3 g/dL — ABNORMAL LOW (ref 12.0–15.0)
MCH: 30.9 pg (ref 26.0–34.0)
MCHC: 33.3 g/dL (ref 30.0–36.0)
MCV: 92.6 fL (ref 78.0–100.0)
Platelets: 252 10*3/uL (ref 150–400)
RBC: 3.66 MIL/uL — ABNORMAL LOW (ref 3.87–5.11)
RDW: 14.3 % (ref 11.5–15.5)
WBC: 6.6 10*3/uL (ref 4.0–10.5)

## 2014-03-25 LAB — BASIC METABOLIC PANEL
Anion gap: 14 (ref 5–15)
BUN: 14 mg/dL (ref 6–23)
CO2: 29 mEq/L (ref 19–32)
Calcium: 9.6 mg/dL (ref 8.4–10.5)
Chloride: 91 mEq/L — ABNORMAL LOW (ref 96–112)
Creatinine, Ser: 0.98 mg/dL (ref 0.50–1.10)
GFR calc Af Amer: 66 mL/min — ABNORMAL LOW (ref >90)
GFR calc non Af Amer: 57 mL/min — ABNORMAL LOW (ref >90)
Glucose, Bld: 95 mg/dL (ref 70–99)
Potassium: 4.4 mEq/L (ref 3.7–5.3)
Sodium: 134 mEq/L — ABNORMAL LOW (ref 137–147)

## 2014-03-25 NOTE — ED Notes (Signed)
Pt. Reports that she does not sleep sound. She has to get up with her son who has seizures.  She also reports that she has been having fluttering for over 15 years.  She reports that she has increased stress in her life and she believes it causes her heart to flutter.

## 2014-03-25 NOTE — Discharge Instructions (Signed)

## 2014-03-25 NOTE — ED Notes (Signed)
Per pt sts that she has chronic palpitations and she has been having them at night when she lays down. Denies any currently. Denies nay chest pain. sts previously she was on medication for it.

## 2014-03-27 ENCOUNTER — Emergency Department (INDEPENDENT_AMBULATORY_CARE_PROVIDER_SITE_OTHER)
Admission: EM | Admit: 2014-03-27 | Discharge: 2014-03-27 | Disposition: A | Discharge status: Home / Self Care | Payer: PRIVATE HEALTH INSURANCE | Source: Home / Self Care | Attending: Family Medicine | Admitting: Family Medicine

## 2014-03-27 ENCOUNTER — Encounter (HOSPITAL_COMMUNITY): Payer: Self-pay | Admitting: Emergency Medicine

## 2014-03-27 DIAGNOSIS — I83891 Varicose veins of right lower extremities with other complications: Secondary | ICD-10-CM

## 2014-03-27 DIAGNOSIS — I83893 Varicose veins of bilateral lower extremities with other complications: Secondary | ICD-10-CM

## 2014-03-27 LAB — POCT I-STAT, CHEM 8
BUN: 15 mg/dL (ref 6–23)
CALCIUM ION: 1.17 mmol/L (ref 1.13–1.30)
Chloride: 97 mEq/L (ref 96–112)
Creatinine, Ser: 1.1 mg/dL (ref 0.50–1.10)
Glucose, Bld: 91 mg/dL (ref 70–99)
HEMATOCRIT: 39 % (ref 36.0–46.0)
HEMOGLOBIN: 13.3 g/dL (ref 12.0–15.0)
Potassium: 3.9 mEq/L (ref 3.7–5.3)
Sodium: 137 mEq/L (ref 137–147)
TCO2: 28 mmol/L (ref 0–100)

## 2014-03-27 NOTE — ED Notes (Signed)
Pt reports she started to bleed from right foot yest Reports her varicose vein started to bleed; EMT evaluated pt Applied a pressure dressing Hx of DVT; taking coumadin daily Bleeding controlled Alert w/no signs of acute distress.

## 2014-03-27 NOTE — Discharge Instructions (Signed)
Leave leg bandaged until mon, drink plenty of water, see your doctor if further problems.

## 2014-03-27 NOTE — ED Provider Notes (Signed)
CSN: [id number]     Arrival date & time 03/27/14  1115 History   First MD Initiated Contact with Patient 03/27/14 1148     Chief Complaint  Patient presents with  . Bleeding/Bruising   (Consider location/radiation/quality/duration/timing/severity/associated sxs/prior Treatment) Patient is a 73 y.o. female presenting with wound check. The history is provided by the patient.  Wound Check This is a new problem. The current episode started yesterday (bleeding varicose vein yest, bandaged by ems, here for recheck and eval of woozines since episode.). The problem has been resolved. Pertinent negatives include no chest pain, no abdominal pain, no headaches and no shortness of breath.    Past Medical History  Diagnosis Date  . Depression   . Schizophrenia   . Hyperlipidemia   . Hypertension   . Chronic pain     "over my whole body" (03/30/2013)  . 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 DVT (deep venous thrombosis)     per 01/17/10 d/c summary- "remote hx of dvt"?  . Iron deficiency anemia   . Glaucoma   . Hemorrhoids   . Asthma   . Anxiety   . GERD (gastroesophageal reflux disease)   . Psoriasis 04/29/2012    New onset evaluated by Dr Marylou FlesherWilliam Huang, D[facility]at[facility], [facility] 6/13  Rx 0.1% Tacrolimus ointment  . Complication of anesthesia     "because I have sleep apnea" (03/30/2013)  . Heart murmur   . Chest pain, exertional   . Chronic bronchitis   . Sleep apnea     "waiting on my CPAP" (03/30/2013)  . Pneumonia     "a few times" (03/30/2013)  . Exertional shortness of breath     "& sometimes when laying down" (03/30/2013)  . Daily headache     "last 2 months" (03/30/2013)  . Arthritis     "joints" (03/30/2013)  . Varicose veins of legs   . Psoriasis    Past Surgical History  Procedure Laterality Date  . Spinal fusion      2004  . Carpal tunnel release Bilateral   . Tubal ligation  1973  . Dilation and curettage of uterus  1970's    "once" (03/30/2013)  . Total  knee arthroplasty Right 11/2008  . Joint replacement     Family History  Problem Relation Age of Onset  . Diabetes Sister   . Colon cancer Brother 40   History  Substance Use Topics  . Smoking status: Never Smoker   . Smokeless tobacco: Never Used  . Alcohol Use: No   OB History   Grav Para Term Preterm Abortions TAB SAB Ect Mult Living                 Review of Systems  Constitutional: Negative.   Respiratory: Negative for shortness of breath.   Cardiovascular: Negative for chest pain.  Gastrointestinal: Negative for abdominal pain.  Skin: Positive for wound.  Neurological: Positive for dizziness and light-headedness. Negative for headaches.    Allergies  Other  Home Medications   Prior to Admission medications   Medication Sig Start Date End Date Taking? Authorizing Provider  atenolol (TENORMIN) 100 MG tablet Take 1 tablet (100 mg total) by mouth daily. 01/29/14  Yes Hazeline Junkeryan Grunz, MD  fluticasone (FLOVENT HFA) 220 MCG/ACT inhaler Inhale 2 puffs into the lungs daily.    Yes Historical Provider, MD  gabapentin (NEURONTIN) 100 MG capsule Take 2 capsules (200 mg total) by mouth 3 (three) times daily.  03/08/14  Yes Jacquelin Hawkingalph Nettey, MD  hydrochlorothiazide (HYDRODIURIL) 25 MG tablet Take 1 tablet (25 mg total) by mouth every morning. 01/29/14  Yes Hazeline Junkeryan Grunz, MD  hydrOXYzine (VISTARIL) 25 MG capsule Take 25 mg by mouth 3 (three) times daily as needed for itching.   Yes Historical Provider, MD  lisinopril (PRINIVIL,ZESTRIL) 10 MG tablet Take 1 tablet (10 mg total) by mouth daily. 01/29/14  Yes Hazeline Junkeryan Grunz, MD  warfarin (COUMADIN) 5 MG tablet Take 1 tablet (5 mg total) by mouth daily. 01/28/14  Yes Levert FeinsteinJames M Granfortuna, MD  albuterol (PROVENTIL HFA;VENTOLIN HFA) 108 (90 BASE) MCG/ACT inhaler Inhale 2 puffs into the lungs every 4 (four) hours as needed for wheezing or shortness of breath. 10/21/13   Hayden Rasmussenavid Mabe, NP  esomeprazole (NEXIUM) 10 MG packet Take 10 mg by mouth daily before breakfast.     Historical Provider, MD  FLUoxetine (PROZAC) 20 MG capsule Take 60 mg by mouth daily.    Historical Provider, MD  meclizine (ANTIVERT) 25 MG tablet Take 25 mg by mouth 3 (three) times daily as needed for dizziness (for vertigo). 03/08/14   Jacquelin Hawkingalph Nettey, MD  silver sulfADIAZINE (SILVADENE) 1 % cream Apply 1 application topically daily. Apply to affected areas for shingles 02/23/14   Ardis RowanJennifer Lee Presson, PA  sucralfate (CARAFATE) 1 G tablet Take 1 g by mouth 3 (three) times daily.     Historical Provider, MD  traMADol (ULTRAM) 50 MG tablet Take 1-2 tablets (50-100 mg total) by mouth every 8 (eight) hours as needed. 03/22/14   Elenora GammaSamuel L Bradshaw, MD  triamcinolone (KENALOG) 0.025 % ointment Apply 1 application topically 2 (two) times daily. Apply to affected areas for shingles 02/23/14   Jess BartersJennifer Lee Presson, PA   BP 149/83  Pulse 71  Temp(Src) 97.6 F (36.4 C) (Oral)  Resp 18  SpO2 97%  LMP 11/22/1996 Physical Exam  Nursing note and vitals reviewed. Constitutional: She is oriented to person, place, and time. She appears well-developed and well-nourished. No distress.  Cardiovascular: Normal heart sounds.   Pulmonary/Chest: Effort normal and breath sounds normal.  Musculoskeletal: She exhibits no tenderness.  Neurological: She is alert and oriented to person, place, and time.  Skin: Skin is warm and dry.  Severe bilat lower ext varicosities, no bleeding at present.    ED Course  Procedures (including critical care time) Labs Review Labs Reviewed  POCT I-STAT, CHEM 8   i-stat wnl. Imaging Review No results found.   MDM   1. Varicose veins of lower extremities with complications, right    Wound care.    Linna HoffJames D Meris Reede, MD 03/27/14 [id number]

## 2014-03-28 NOTE — ED Provider Notes (Signed)
CSN: [id number]     Arrival date & time 03/25/14  1245 History   First MD Initiated Contact with Patient 03/25/14 1311     Chief Complaint  Patient presents with  . Irregular Heart Beat     (Consider location/radiation/quality/duration/timing/severity/associated sxs/prior Treatment) HPI  73yF with palpitations. She has had similar for about 15 years. More noticeable when laying down at night recently. Feels like she is having extra beats. No appreciable exacerbating or relieving factors otherwise. No pain. No SOB. No fever, sweats or chills. No new medications. Pt attributes to stress. She has an adult son with developmental delay and seizures who requires a lot of care.   Past Medical History  Diagnosis Date  . Depression   . Schizophrenia   . Hyperlipidemia   . Hypertension   . Chronic pain     "over my whole body" (03/30/2013)  . 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 DVT (deep venous thrombosis)     per 01/17/10 d/c summary- "remote hx of dvt"?  . Iron deficiency anemia   . Glaucoma   . Hemorrhoids   . Asthma   . Anxiety   . GERD (gastroesophageal reflux disease)   . Psoriasis 04/29/2012    New onset evaluated by Dr Marylou FlesherWilliam Huang, Dermatology, [facility] 6/13  Rx 0.1% Tacrolimus ointment  . Complication of anesthesia     "because I have sleep apnea" (03/30/2013)  . Heart murmur   . Chest pain, exertional   . Chronic bronchitis   . Sleep apnea     "waiting on my CPAP" (03/30/2013)  . Pneumonia     "a few times" (03/30/2013)  . Exertional shortness of breath     "& sometimes when laying down" (03/30/2013)  . Daily headache     "last 2 months" (03/30/2013)  . Arthritis     "joints" (03/30/2013)  . Varicose veins of legs   . Psoriasis    Past Surgical History  Procedure Laterality Date  . Spinal fusion      2004  . Carpal tunnel release Bilateral   . Tubal ligation  1973  . Dilation and curettage of uterus  1970's    "once" (03/30/2013)  . Total knee  arthroplasty Right 11/2008  . Joint replacement     Family History  Problem Relation Age of Onset  . Diabetes Sister   . Colon cancer Brother 40   History  Substance Use Topics  . Smoking status: Never Smoker   . Smokeless tobacco: Never Used  . Alcohol Use: No   OB History   Grav Para Term Preterm Abortions TAB SAB Ect Mult Living                 Review of Systems  All systems reviewed and negative, other than as noted in HPI.   Allergies  Other  Home Medications   Prior to Admission medications   Medication Sig Start Date End Date Taking? Authorizing Provider  albuterol (PROVENTIL HFA;VENTOLIN HFA) 108 (90 BASE) MCG/ACT inhaler Inhale 2 puffs into the lungs every 4 (four) hours as needed for wheezing or shortness of breath. 10/21/13  Yes Hayden Rasmussenavid Mabe, NP  atenolol (TENORMIN) 100 MG tablet Take 1 tablet (100 mg total) by mouth daily. 01/29/14  Yes Hazeline Junkeryan Grunz, MD  esomeprazole (NEXIUM) 10 MG packet Take 10 mg by mouth daily before breakfast.   Yes Historical Provider, MD  FLUoxetine (PROZAC) 20 MG capsule Take 60 mg by  mouth daily.   Yes Historical Provider, MD  fluticasone (FLOVENT HFA) 220 MCG/ACT inhaler Inhale 2 puffs into the lungs daily.    Yes Historical Provider, MD  gabapentin (NEURONTIN) 100 MG capsule Take 2 capsules (200 mg total) by mouth 3 (three) times daily. 03/08/14  Yes Jacquelin Hawkingalph Nettey, MD  hydrochlorothiazide (HYDRODIURIL) 25 MG tablet Take 1 tablet (25 mg total) by mouth every morning. 01/29/14  Yes Hazeline Junkeryan Grunz, MD  hydrOXYzine (VISTARIL) 25 MG capsule Take 25 mg by mouth 3 (three) times daily as needed for itching.   Yes Historical Provider, MD  lisinopril (PRINIVIL,ZESTRIL) 10 MG tablet Take 1 tablet (10 mg total) by mouth daily. 01/29/14  Yes Hazeline Junkeryan Grunz, MD  meclizine (ANTIVERT) 25 MG tablet Take 25 mg by mouth 3 (three) times daily as needed for dizziness (for vertigo). 03/08/14  Yes Jacquelin Hawkingalph Nettey, MD  silver sulfADIAZINE (SILVADENE) 1 % cream Apply 1 application  topically daily. Apply to affected areas for shingles 02/23/14  Yes Jess BartersJennifer Lee Presson, PA  sucralfate (CARAFATE) 1 G tablet Take 1 g by mouth 3 (three) times daily.    Yes Historical Provider, MD  traMADol (ULTRAM) 50 MG tablet Take 1-2 tablets (50-100 mg total) by mouth every 8 (eight) hours as needed. 03/22/14  Yes Elenora GammaSamuel L Bradshaw, MD  triamcinolone (KENALOG) 0.025 % ointment Apply 1 application topically 2 (two) times daily. Apply to affected areas for shingles 02/23/14  Yes Jess BartersJennifer Lee Presson, PA  warfarin (COUMADIN) 5 MG tablet Take 1 tablet (5 mg total) by mouth daily. 01/28/14  Yes Levert FeinsteinJames M Granfortuna, MD   BP 133/65  Pulse 72  Temp(Src) 98 F (36.7 C)  Resp 12  SpO2 95%  LMP 11/22/1996 Physical Exam  Nursing note and vitals reviewed. Constitutional: She appears well-developed and well-nourished. No distress.  Laying in bed. NAD. Obese.   HENT:  Head: Normocephalic and atraumatic.  Eyes: Conjunctivae are normal. Right eye exhibits no discharge. Left eye exhibits no discharge.  Neck: Neck supple.  Cardiovascular: Normal rate, regular rhythm and normal heart sounds.  Exam reveals no gallop and no friction rub.   No murmur heard. Pulmonary/Chest: Effort normal and breath sounds normal. No respiratory distress.  Abdominal: Soft. She exhibits no distension. There is no tenderness.  Musculoskeletal: She exhibits no edema and no tenderness.  Neurological: She is alert.  Skin: Skin is warm and dry. She is not diaphoretic.  Psychiatric: She has a normal mood and affect. Her behavior is normal. Thought content normal.    ED Course  Procedures (including critical care time) Labs Review Labs Reviewed  CBC - Abnormal; Notable for the following:    RBC 3.66 (*)    Hemoglobin 11.3 (*)    HCT 33.9 (*)    All other components within normal limits  BASIC METABOLIC PANEL - Abnormal; Notable for the following:    Sodium 134 (*)    Chloride 91 (*)    GFR calc non Af Amer 57 (*)    GFR  calc Af Amer 66 (*)    All other components within normal limits  I-STAT TROPOININ, ED    Imaging Review No results found.   EKG Interpretation   Date/Time:  Thursday March 25 2014 12:50:00 EDT Ventricular Rate:  86 PR Interval:  176 QRS Duration: 88 QT Interval:  384 QTC Calculation: 459 R Axis:   20 Text Interpretation:  Normal sinus rhythm Minimal voltage criteria for  LVH, may be normal variant Borderline ECG Confirmed by Juleen ChinaKOHUT  MD, Jeannett Senior  [phone]) on 03/25/2014 1:55:36 PM      MDM   Final diagnoses:  Palpitations    73yF with palpitations. Feels like extra beats and notices it more when laying down to sleep. May be PVCs. HD stable. EKG non concerning. No pres-syncopal symptoms. HAs had these palpitations for many years.Low suspicion for emergent process.     Raeford Razor, MD 03/28/14 [phone]

## 2014-03-30 ENCOUNTER — Other Ambulatory Visit: Payer: PRIVATE HEALTH INSURANCE

## 2014-03-30 ENCOUNTER — Ambulatory Visit: Payer: PRIVATE HEALTH INSURANCE

## 2014-03-30 ENCOUNTER — Telehealth: Payer: Self-pay | Admitting: Pharmacist

## 2014-03-30 NOTE — Telephone Encounter (Signed)
I s/w pt over phone today since she missed her Coumadin clinic appt. She did not come because Rinaldo Cloudamela, Charity fundraiserN (cell # [phone]) from [facility] was visiting her at her home.  Rinaldo Cloudamela has been to [name]'s house 2 times so far & [name] [name] has found it helpful to talk w/ a RN about some of her chronic medical conditions.  She is due to return for another home visit in 2 weeks.  [name] [name] did say they are working on trying to avoid ER visits, however she was in the ER last week on 7/2 (heart palpitations) and then at Urgent Care on 7/4 (bleeding from varicose vein; wound care).  During neither of these visits was an INR checked. We will reschedule [name] for 04/06/14 at 11 am for lab; 11:15 am for Coumadin clinic. [name] [name], Pharm.D., CPP 03/30/2014@4 :04 PM

## 2014-04-02 ENCOUNTER — Ambulatory Visit: Payer: PRIVATE HEALTH INSURANCE | Admitting: Family Medicine

## 2014-04-05 ENCOUNTER — Encounter: Payer: Self-pay | Admitting: Family Medicine

## 2014-04-05 ENCOUNTER — Ambulatory Visit (INDEPENDENT_AMBULATORY_CARE_PROVIDER_SITE_OTHER): Payer: PRIVATE HEALTH INSURANCE | Admitting: Family Medicine

## 2014-04-05 VITALS — BP 136/77 | HR 71 | Temp 99.5°F | Ht 65.0 in | Wt 245.0 lb

## 2014-04-05 DIAGNOSIS — M5442 Lumbago with sciatica, left side: Principal | ICD-10-CM

## 2014-04-05 DIAGNOSIS — M5441 Lumbago with sciatica, right side: Secondary | ICD-10-CM

## 2014-04-05 DIAGNOSIS — M5430 Sciatica, unspecified side: Secondary | ICD-10-CM

## 2014-04-05 MED ORDER — GABAPENTIN 300 MG PO CAPS: 300.0000 mg | ORAL_CAPSULE | Freq: Three times a day (TID) | ORAL | Status: DC

## 2014-04-05 MED ORDER — TRAMADOL HCL 50 MG PO TABS: 50.0000 mg | ORAL_TABLET | Freq: Three times a day (TID) | ORAL | Status: DC | PRN

## 2014-04-05 NOTE — Assessment & Plan Note (Addendum)
Increase Gabapentin to 300mg  TID. Refill tramadol. Follow-up with PCP. No red flags for cauda equina syndrome

## 2014-04-05 NOTE — Progress Notes (Signed)
   Subjective:    Patient ID: [name] SnowballDora C [name], female    DOB: 04/12/1941, 73 y.o.   MRN: [id number]  HPI Patient presents for follow-up of back pain. Patient currently taking tramadol and gabapentin. She states that her pain is not controlled on these medications. She [facility] and will be starting physical therapy soon. Pertinent negatives include no urinary or bowel incontinence.   Review of Systems See HPI     Objective:   Physical Exam  Musculoskeletal:       Lumbar back: She exhibits no tenderness.  5/5 LE strength bilaterally          Assessment & Plan:

## 2014-04-05 NOTE — Patient Instructions (Addendum)
[name] [name], it was a pleasure seeing you today. Today we talked about sciatica pain. I'm happy to hear you will be starting physical therapy soon. I will not prescribe Vicodin for your pain. I will, however, increase your gabapentin dose to see if that helps. I want you to follow-up with out office in 2-4 weeks to see if we need to continue increasing that Gabapentin dose so we can get adequate control of your pain. I will also refill your tramadol in the mean time.  Please schedule an appointment to see Dr. Jarvis NewcomerGrunz in 4 weeks.   If you have any questions or concerns, please do not hesitate to call the office at [phone].  Sincerely,  Jacquelin Hawkingalph Penni Penado, MD

## 2014-04-06 ENCOUNTER — Ambulatory Visit: Payer: PRIVATE HEALTH INSURANCE

## 2014-04-06 ENCOUNTER — Telehealth: Payer: Self-pay | Admitting: Pharmacist

## 2014-04-06 ENCOUNTER — Other Ambulatory Visit: Payer: PRIVATE HEALTH INSURANCE

## 2014-04-06 NOTE — Telephone Encounter (Signed)
Pt called and cancelled her lab and CC appointments for today. She rescheduled for 04/08/14: Lab = 11am, CC = 11:15am.

## 2014-04-07 ENCOUNTER — Telehealth: Payer: Self-pay | Admitting: Family Medicine

## 2014-04-07 NOTE — Telephone Encounter (Signed)
[name] [name] calling to ask for a new rx for her Tramadol because the pharmacy said that the rx on file is too early to fill.  Will need a new rx rewritten for this.

## 2014-04-08 ENCOUNTER — Encounter: Payer: Self-pay | Admitting: Family Medicine

## 2014-04-08 ENCOUNTER — Other Ambulatory Visit: Payer: PRIVATE HEALTH INSURANCE

## 2014-04-08 ENCOUNTER — Emergency Department (INDEPENDENT_AMBULATORY_CARE_PROVIDER_SITE_OTHER)
Admission: EM | Admit: 2014-04-08 | Discharge: 2014-04-08 | Disposition: A | Discharge status: Home / Self Care | Payer: PRIVATE HEALTH INSURANCE | Source: Home / Self Care | Attending: Emergency Medicine | Admitting: Emergency Medicine

## 2014-04-08 ENCOUNTER — Ambulatory Visit: Payer: PRIVATE HEALTH INSURANCE

## 2014-04-08 ENCOUNTER — Encounter (HOSPITAL_COMMUNITY): Payer: Self-pay | Admitting: Emergency Medicine

## 2014-04-08 ENCOUNTER — Telehealth: Payer: Self-pay | Admitting: Pharmacist

## 2014-04-08 DIAGNOSIS — M791 Myalgia, unspecified site: Secondary | ICD-10-CM

## 2014-04-08 DIAGNOSIS — M2550 Pain in unspecified joint: Secondary | ICD-10-CM

## 2014-04-08 NOTE — ED Provider Notes (Signed)
Chief Complaint    Chief Complaint  Patient presents with  . Medication Refill    History of Present Illness     [name] [name] is a 73 year old female who comes in today complaining of generalized arthralgias and myalgias. This is been going on for 15 years. [facility] for [facility]. She has been placed on gabapentin and tramadol. She comes in requesting a prescription for hydrocodone 10 mg strength. The patient states she's been off of this for a number of years, but is the best thing for her joints. She's not able to get her tramadol filled, apparently she took too many in the last month and  the  pharmacy would not refill it. Her gabapentin dose is 300 mg 3 times a day. She describes generalized, all of her pain with pains in arms, legs, neck, and back. She has particular pain in both of her knees. She's been diagnosed with osteoarthritis. She had a right knee replacement. Her pain is 10 over 10. She states she's unable to sleep because of the pain.  Review of Systems     Other than as noted above, the patient denies any of the following symptoms: Systemic:  No fevers, chills, sweats, or muscle aches.  No weight loss.  Eye:  No redness, pain, or discharge. ENT:  No oral ulcerations or sore throat. Respiratory:  No shortness of breath or cough. Cardiovascular:  No chest pain. GI:  No abdominal pain or diarrhea. GU:  No dysuria or discharge. Musculoskeletal:  No back pain or neck pain. Neurological:  No muscular weakness, paresthesias, or headache.  PMFSH    Past medical history, family history, social history, meds, and allergies were reviewed.  Current medications include albuterol, atenolol, Nexium, Prozac, Flovent, Neurontin, hydrochlorothiazide, hydroxyzine, lisinopril, meclizine, Carafate, tramadol, and warfarin. Medical history includes depression, schizophrenia, hyperlipidemia, hypertension, chronic pain, pulmonary embolism, DVT,  iron deficiency anemia, glaucoma, and hemorrhoids. She also has asthma, anxiety, gastroesophageal reflux, psoriasis, sleep apnea, daily headache, and varicose veins.  Physical Exam    Vital signs:  BP 151/93  Pulse 71  Temp(Src) 99.8 F (37.7 C) (Oral)  Resp 18  SpO2 95%  LMP 11/22/1996 Gen:  Alert and oriented times 3.  In no distress. Eyes:  Lids normal, no conjunctival injection or discharge. ENT:  No oral ulcerations or lesions.  Pharynx clear. Neck:  No adenopathy. Lungs:  Clear to auscultation. Heart:  Regular rhythm, no gallop or murmer.  Abdomen:  No tenderness, organomegaly or mass.  Musculoskeletal: she has generalized, all of her pain in all of her extremities, her back, her neck, and also in her chest and abdomen. All joints have full range of motion. There is no swelling or deformity.  No edema, pulses full. Extremities were warm and pink.  Capillary refill was brisk.  Skin:  Clear, warm and dry.  No rash. Neuro:  Alert and oriented times 3.  Muscle strength was normal.  Sensation was intact to light touch.   Assessment    The primary encounter diagnosis was Arthralgia. A diagnosis of Myalgia was also pertinent to this visit.  Her chronic pain syndrome seems to best fit with fibromyalgia. I do not think hydrocodone but ideal for her. I told her she needed to stick with the tramadol and gabapentin has been prescribed for by her primary care physician. She'll have to wait until she can get the tramadol filled which should be next week. In the meantime  she can increase her gabapentin dose by 1 pill to one in the morning, one at midday, and 2 at bedtime.  Plan   1.  Meds:  The following meds were prescribed:   Discharge Medication List as of 04/08/2014 12:30 PM      2.  Patient Education/Counseling:  The patient was given appropriate handouts, self care instructions, and instructed in symptomatic relief, including rest and activity and application of heat or ice.    3.   Follow up:  The patient was told to follow up here if no better in 3 to 4 days, or sooner if becoming worse in any way, and given some red flag symptoms such as worsening pain or new neurological symptoms which would prompt immediate return.      Reuben Likes, MD 04/08/14 [phone]

## 2014-04-08 NOTE — Discharge Instructions (Signed)
Increase gabapentin 300 mg to 1 in morning, 1 at mid-day, and 2 at bedtime.  Follow up with Dr. Jarvis NewcomerGrunz in 1 week.

## 2014-04-08 NOTE — Progress Notes (Unsigned)
Patient does not have any pain medication and wants to see about getting Hydocodone.  She cannot take the

## 2014-04-08 NOTE — Telephone Encounter (Signed)
Patient called and cancelled today's appts because she is sick and will go to see her PCP. She will come in tomorrow, 04/09/14, for lab and Coumadin Clinic.

## 2014-04-08 NOTE — ED Notes (Signed)
Medication refill per patient.  Patient reports she is out of hydrocodone.  Patient reports she missed an appt on Friday , she thinks.  Patient has a nurse that is trying to help patient keep appts and stay out of emergency department

## 2014-04-09 ENCOUNTER — Telehealth: Payer: Self-pay | Admitting: Pharmacist

## 2014-04-09 ENCOUNTER — Telehealth: Payer: Self-pay | Admitting: Family Medicine

## 2014-04-09 ENCOUNTER — Ambulatory Visit: Payer: PRIVATE HEALTH INSURANCE

## 2014-04-09 ENCOUNTER — Other Ambulatory Visit: Payer: PRIVATE HEALTH INSURANCE

## 2014-04-09 NOTE — Telephone Encounter (Signed)
Pt called [facility] pharmacy (repeat call in same day) and states she is "cutting all ties to [facility]."  She requested we take her out of our Coumadin clinic.  She is unhappy with the care she has received within the [facility] and she does not plan to come back to any [facility] facility. I reminded her of the importance of staying on Coumadin and the risk of not keeping her INR in goal.  We also discussed how the Coumadin clinic pharmacists have helped her throughout the years and we enjoy our visits.  She agreed to stay in our Coumadin clinic but that is the extent of her going to any [facility] facility from now on. I also encouraged [name] [name] to continue to keep her visits w/ [facility] nurse.   This is not the first time I have had this type of conversation with pt.   I will pass message along to Dr. Cyndie ChimeGranfortuna.  She does not have any upcoming scheduled appts w/ Dr. Cyndie ChimeGranfortuna. [name] [name], Pharm.D., CPP 04/09/2014@10 :15 AM

## 2014-04-09 NOTE — Telephone Encounter (Signed)
Mrs. Laural BenesJohnson called to reschedule 7/17 appt.

## 2014-04-09 NOTE — Telephone Encounter (Signed)
Patient called to reschedule lab/CC appts again today. She has rescheduled several times and states that she will definitely be here next Tuesday.

## 2014-04-09 NOTE — Telephone Encounter (Signed)
Pt called and canceled her 7/30 appointment because she no longer wants to be a pt at [facility] or deal with any of the [facility]. She said that we do not fill prescriptions and we do not have enough time for her on her visits. She said that this is not the kind of practice she wants to be associated with. She is going to find a PCP that is out of the [facility] network. She also would like Jeannette to call her at [phone]. jw

## 2014-04-13 ENCOUNTER — Telehealth: Payer: Self-pay | Admitting: Pharmacist

## 2014-04-13 ENCOUNTER — Other Ambulatory Visit (HOSPITAL_BASED_OUTPATIENT_CLINIC_OR_DEPARTMENT_OTHER): Payer: PRIVATE HEALTH INSURANCE

## 2014-04-13 ENCOUNTER — Ambulatory Visit (HOSPITAL_BASED_OUTPATIENT_CLINIC_OR_DEPARTMENT_OTHER): Payer: PRIVATE HEALTH INSURANCE | Admitting: Pharmacist

## 2014-04-13 DIAGNOSIS — I2699 Other pulmonary embolism without acute cor pulmonale: Secondary | ICD-10-CM

## 2014-04-13 DIAGNOSIS — Z7901 Long term (current) use of anticoagulants: Secondary | ICD-10-CM

## 2014-04-13 DIAGNOSIS — D6851 Activated protein C resistance: Secondary | ICD-10-CM

## 2014-04-13 LAB — PROTIME-INR
INR: 1.5 — ABNORMAL LOW (ref 2.00–3.50)
PROTIME: 18 s — AB (ref 10.6–13.4)

## 2014-04-13 LAB — POCT INR: INR: 1.5

## 2014-04-13 NOTE — Patient Instructions (Addendum)
You already took 5mg  (1 tablet) this morning.  Take an additional tablet when you get home today for a total of 10mg  (2 tablets) today.  On 04/14/14, continue 5mg  daily. Recheck INR in 2 weeks on 04/28/14: Lab at 11am and coumadin clinic at 11:15am.

## 2014-04-13 NOTE — Progress Notes (Signed)
INR below goal today. Pt took coumadin as instructed. No missed or extra coumadin doses. No changes in diet or medications. Pt is not taking Singulair. She would like to try to get back on it. [place] next [place]. No unusual bruising. No bleeding noted. No s/s of clotting noted. Pt already took 5mg  (1 tablet) this morning.  She will take an additional tablet tonight for a total of 10mg  (2 tablets) today.  On 04/14/14, continue 5mg  daily. Recheck INR in 2 weeks on 04/28/14: Lab at 11am and coumadin clinic at 11:15am.

## 2014-04-13 NOTE — Telephone Encounter (Signed)
Called pt and left message with Marilu FavreClarence. Dr. Cyndie ChimeGranfortuna would like [name] [name] to call [phone] to set up an appointment to see Dr. Cyndie ChimeGranfortuna in September.

## 2014-04-13 NOTE — Telephone Encounter (Signed)
Attempted to call patient--phone busy x 2.  Will try to call again later.  Altamese Dilling~Lycan Davee, BSN, RN-BC

## 2014-04-13 NOTE — Telephone Encounter (Signed)
Returned call to patient and spoke with her husband.  States patient is asleep and he will have her return my call tomorrow.  Altamese Dilling~Ryan Ogborn, BSN, RN-BC

## 2014-04-15 ENCOUNTER — Ambulatory Visit (INDEPENDENT_AMBULATORY_CARE_PROVIDER_SITE_OTHER): Payer: Medicare Other | Admitting: Podiatrist

## 2014-04-15 VITALS — Resp 16

## 2014-04-15 DIAGNOSIS — M79673 Pain in unspecified foot: Secondary | ICD-10-CM

## 2014-04-15 DIAGNOSIS — M79609 Pain in unspecified limb: Secondary | ICD-10-CM

## 2014-04-15 DIAGNOSIS — B351 Tinea unguium: Secondary | ICD-10-CM

## 2014-04-15 NOTE — Progress Notes (Signed)
HPI: Patient presents today for follow up of diabetic foot and nail care. Past medical history, meds, and allergies reviewed. Patient states blood sugar is under good  control.   Objective:   Objective:  Patients chart is reviewed.  Vascular status reveals pedal pulses noted at  1 out of 4 dp and pt bilateral .  Neurological sensation is Decreased to [facility] monofilament bilateral at 2/5 sites bilateral.  Dermatological exam reveals  absence of pre ulcerative/ hyperkeratotic lesions.   Toenails are elongated, incurvated, discolored, dystrophic with ingrown deformity present. Pes planus deformity with associated pain is noted.   Assessment: Diabetes with Neuropathy , Ingrown nail deformity, pes planus   Plan: Discussed treatment options and alternatives. Debrided nails without complication. Recommended diabetic shoes and inserts- she will be seeing her new primary care doctor in 3 days and will call after her visit with him with his information to sent request for diabetic shoes.  Return appointment recommended at routine intervals of 3 months.   Marlowe AschoffKathryn Egerton, DPM

## 2014-04-20 ENCOUNTER — Ambulatory Visit: Payer: PRIVATE HEALTH INSURANCE | Admitting: Physical Therapy

## 2014-04-22 ENCOUNTER — Ambulatory Visit: Payer: PRIVATE HEALTH INSURANCE | Admitting: Family Medicine

## 2014-04-22 NOTE — Addendum Note (Signed)
Addended by: Neita GoodnightPERSSON, Timmya Blazier L on: 04/22/2014 08:59 AM   Modules accepted: Orders, Medications

## 2014-04-22 NOTE — Addendum Note (Signed)
Addended by: Neita GoodnightPERSSON, Veverly Larimer L on: 04/22/2014 09:05 AM   Modules accepted: Orders

## 2014-04-23 ENCOUNTER — Emergency Department (HOSPITAL_COMMUNITY): Payer: PRIVATE HEALTH INSURANCE

## 2014-04-23 ENCOUNTER — Encounter (HOSPITAL_COMMUNITY): Payer: Self-pay | Admitting: Emergency Medicine

## 2014-04-23 ENCOUNTER — Inpatient Hospital Stay (HOSPITAL_COMMUNITY)
Admission: EM | Admit: 2014-04-23 | Discharge: 2014-04-25 | DRG: 291 | Disposition: A | Discharge status: Home / Self Care | Payer: PRIVATE HEALTH INSURANCE | Attending: Internal Medicine | Admitting: Internal Medicine

## 2014-04-23 DIAGNOSIS — I369 Nonrheumatic tricuspid valve disorder, unspecified: Secondary | ICD-10-CM

## 2014-04-23 DIAGNOSIS — J9600 Acute respiratory failure, unspecified whether with hypoxia or hypercapnia: Secondary | ICD-10-CM | POA: Diagnosis present

## 2014-04-23 DIAGNOSIS — E669 Obesity, unspecified: Secondary | ICD-10-CM

## 2014-04-23 DIAGNOSIS — F209 Schizophrenia, unspecified: Secondary | ICD-10-CM | POA: Diagnosis present

## 2014-04-23 DIAGNOSIS — J45909 Unspecified asthma, uncomplicated: Secondary | ICD-10-CM | POA: Diagnosis present

## 2014-04-23 DIAGNOSIS — Z833 Family history of diabetes mellitus: Secondary | ICD-10-CM

## 2014-04-23 DIAGNOSIS — M129 Arthropathy, unspecified: Secondary | ICD-10-CM | POA: Diagnosis present

## 2014-04-23 DIAGNOSIS — I951 Orthostatic hypotension: Secondary | ICD-10-CM

## 2014-04-23 DIAGNOSIS — R42 Dizziness and giddiness: Secondary | ICD-10-CM

## 2014-04-23 DIAGNOSIS — E785 Hyperlipidemia, unspecified: Secondary | ICD-10-CM | POA: Diagnosis present

## 2014-04-23 DIAGNOSIS — K219 Gastro-esophageal reflux disease without esophagitis: Secondary | ICD-10-CM | POA: Diagnosis present

## 2014-04-23 DIAGNOSIS — D509 Iron deficiency anemia, unspecified: Secondary | ICD-10-CM | POA: Diagnosis present

## 2014-04-23 DIAGNOSIS — Z7901 Long term (current) use of anticoagulants: Secondary | ICD-10-CM

## 2014-04-23 DIAGNOSIS — Z9981 Dependence on supplemental oxygen: Secondary | ICD-10-CM

## 2014-04-23 DIAGNOSIS — I509 Heart failure, unspecified: Secondary | ICD-10-CM | POA: Diagnosis present

## 2014-04-23 DIAGNOSIS — G8929 Other chronic pain: Secondary | ICD-10-CM | POA: Diagnosis present

## 2014-04-23 DIAGNOSIS — F329 Major depressive disorder, single episode, unspecified: Secondary | ICD-10-CM | POA: Diagnosis present

## 2014-04-23 DIAGNOSIS — F3289 Other specified depressive episodes: Secondary | ICD-10-CM

## 2014-04-23 DIAGNOSIS — I10 Essential (primary) hypertension: Secondary | ICD-10-CM

## 2014-04-23 DIAGNOSIS — F322 Major depressive disorder, single episode, severe without psychotic features: Secondary | ICD-10-CM | POA: Diagnosis present

## 2014-04-23 DIAGNOSIS — G4730 Sleep apnea, unspecified: Secondary | ICD-10-CM | POA: Diagnosis present

## 2014-04-23 DIAGNOSIS — Z66 Do not resuscitate: Secondary | ICD-10-CM | POA: Diagnosis present

## 2014-04-23 DIAGNOSIS — F323 Major depressive disorder, single episode, severe with psychotic features: Secondary | ICD-10-CM | POA: Diagnosis present

## 2014-04-23 DIAGNOSIS — Z86711 Personal history of pulmonary embolism: Secondary | ICD-10-CM

## 2014-04-23 DIAGNOSIS — I5033 Acute on chronic diastolic (congestive) heart failure: Principal | ICD-10-CM

## 2014-04-23 DIAGNOSIS — E876 Hypokalemia: Secondary | ICD-10-CM | POA: Diagnosis present

## 2014-04-23 DIAGNOSIS — R748 Abnormal levels of other serum enzymes: Secondary | ICD-10-CM

## 2014-04-23 DIAGNOSIS — R51 Headache: Secondary | ICD-10-CM | POA: Diagnosis present

## 2014-04-23 DIAGNOSIS — D638 Anemia in other chronic diseases classified elsewhere: Secondary | ICD-10-CM | POA: Diagnosis present

## 2014-04-23 DIAGNOSIS — T502X5A Adverse effect of carbonic-anhydrase inhibitors, benzothiadiazides and other diuretics, initial encounter: Secondary | ICD-10-CM | POA: Diagnosis present

## 2014-04-23 DIAGNOSIS — Z800 Family history of malignant neoplasm of digestive organs: Secondary | ICD-10-CM | POA: Diagnosis not present

## 2014-04-23 DIAGNOSIS — M7989 Other specified soft tissue disorders: Secondary | ICD-10-CM | POA: Diagnosis present

## 2014-04-23 DIAGNOSIS — Z96659 Presence of unspecified artificial knee joint: Secondary | ICD-10-CM

## 2014-04-23 DIAGNOSIS — Z86718 Personal history of other venous thrombosis and embolism: Secondary | ICD-10-CM

## 2014-04-23 DIAGNOSIS — R55 Syncope and collapse: Secondary | ICD-10-CM

## 2014-04-23 DIAGNOSIS — Z981 Arthrodesis status: Secondary | ICD-10-CM | POA: Diagnosis not present

## 2014-04-23 DIAGNOSIS — R0902 Hypoxemia: Secondary | ICD-10-CM

## 2014-04-23 DIAGNOSIS — I2699 Other pulmonary embolism without acute cor pulmonale: Secondary | ICD-10-CM

## 2014-04-23 DIAGNOSIS — F411 Generalized anxiety disorder: Secondary | ICD-10-CM | POA: Diagnosis present

## 2014-04-23 DIAGNOSIS — D649 Anemia, unspecified: Secondary | ICD-10-CM

## 2014-04-23 DIAGNOSIS — R0602 Shortness of breath: Secondary | ICD-10-CM

## 2014-04-23 DIAGNOSIS — L409 Psoriasis, unspecified: Secondary | ICD-10-CM

## 2014-04-23 DIAGNOSIS — R45850 Homicidal ideations: Secondary | ICD-10-CM

## 2014-04-23 DIAGNOSIS — D6851 Activated protein C resistance: Secondary | ICD-10-CM

## 2014-04-23 DIAGNOSIS — H409 Unspecified glaucoma: Secondary | ICD-10-CM | POA: Diagnosis present

## 2014-04-23 DIAGNOSIS — Z79899 Other long term (current) drug therapy: Secondary | ICD-10-CM

## 2014-04-23 DIAGNOSIS — R001 Bradycardia, unspecified: Secondary | ICD-10-CM

## 2014-04-23 DIAGNOSIS — R911 Solitary pulmonary nodule: Secondary | ICD-10-CM

## 2014-04-23 DIAGNOSIS — I83813 Varicose veins of bilateral lower extremities with pain: Secondary | ICD-10-CM

## 2014-04-23 DIAGNOSIS — R45851 Suicidal ideations: Secondary | ICD-10-CM

## 2014-04-23 LAB — URINALYSIS, ROUTINE W REFLEX MICROSCOPIC
BILIRUBIN URINE: NEGATIVE
GLUCOSE, UA: NEGATIVE mg/dL
Hgb urine dipstick: NEGATIVE
Ketones, ur: NEGATIVE mg/dL
Leukocytes, UA: NEGATIVE
Nitrite: NEGATIVE
PH: 7 (ref 5.0–8.0)
Protein, ur: NEGATIVE mg/dL
Specific Gravity, Urine: 1.01 (ref 1.005–1.030)
Urobilinogen, UA: 0.2 mg/dL (ref 0.0–1.0)

## 2014-04-23 LAB — BASIC METABOLIC PANEL
ANION GAP: 13 (ref 5–15)
BUN: 8 mg/dL (ref 6–23)
CO2: 27 meq/L (ref 19–32)
CREATININE: 0.88 mg/dL (ref 0.50–1.10)
Calcium: 9 mg/dL (ref 8.4–10.5)
Chloride: 98 mEq/L (ref 96–112)
GFR calc Af Amer: 75 mL/min — ABNORMAL LOW (ref >90)
GFR calc non Af Amer: 65 mL/min — ABNORMAL LOW (ref >90)
Glucose, Bld: 124 mg/dL — ABNORMAL HIGH (ref 70–99)
Potassium: 3.7 mEq/L (ref 3.7–5.3)
Sodium: 138 mEq/L (ref 137–147)

## 2014-04-23 LAB — CBC WITH DIFFERENTIAL/PLATELET
BASOS ABS: 0 10*3/uL (ref 0.0–0.1)
Basophils Relative: 0 % (ref 0–1)
Eosinophils Absolute: 0.3 10*3/uL (ref 0.0–0.7)
Eosinophils Relative: 5 % (ref 0–5)
HCT: 30.7 % — ABNORMAL LOW (ref 36.0–46.0)
Hemoglobin: 10.1 g/dL — ABNORMAL LOW (ref 12.0–15.0)
LYMPHS PCT: 21 % (ref 12–46)
Lymphs Abs: 1.3 10*3/uL (ref 0.7–4.0)
MCH: 31.4 pg (ref 26.0–34.0)
MCHC: 32.9 g/dL (ref 30.0–36.0)
MCV: 95.3 fL (ref 78.0–100.0)
Monocytes Absolute: 0.4 10*3/uL (ref 0.1–1.0)
Monocytes Relative: 6 % (ref 3–12)
Neutro Abs: 4.1 10*3/uL (ref 1.7–7.7)
Neutrophils Relative %: 68 % (ref 43–77)
Platelets: 241 10*3/uL (ref 150–400)
RBC: 3.22 MIL/uL — ABNORMAL LOW (ref 3.87–5.11)
RDW: 15 % (ref 11.5–15.5)
WBC: 6.1 10*3/uL (ref 4.0–10.5)

## 2014-04-23 LAB — TROPONIN I: Troponin I: <0.3 ng/mL (ref <0.30)

## 2014-04-23 LAB — POC OCCULT BLOOD, ED
FECAL OCCULT BLD: NEGATIVE
Fecal Occult Bld: NEGATIVE

## 2014-04-23 LAB — HEPATIC FUNCTION PANEL
ALT: 23 U/L (ref 0–35)
AST: 43 U/L — AB (ref 0–37)
Albumin: 3.2 g/dL — ABNORMAL LOW (ref 3.5–5.2)
Alkaline Phosphatase: 102 U/L (ref 39–117)
Total Protein: 7.5 g/dL (ref 6.0–8.3)

## 2014-04-23 LAB — PROTIME-INR
INR: 2.36 — ABNORMAL HIGH (ref 0.00–1.49)
Prothrombin Time: 25.8 seconds — ABNORMAL HIGH (ref 11.6–15.2)

## 2014-04-23 LAB — PRO B NATRIURETIC PEPTIDE: Pro B Natriuretic peptide (BNP): 707.5 pg/mL — ABNORMAL HIGH (ref 0–125)

## 2014-04-23 LAB — PROCALCITONIN: Procalcitonin: <0.1 ng/mL

## 2014-04-23 MED ORDER — BISACODYL 5 MG PO TBEC
10.0000 mg | DELAYED_RELEASE_TABLET | Freq: Once | ORAL | Status: AC
  Administered 2014-04-23: 10 mg via ORAL
  Filled 2014-04-23: qty 2

## 2014-04-23 MED ORDER — WARFARIN - PHARMACIST DOSING INPATIENT: Freq: Every day | Status: DC

## 2014-04-23 MED ORDER — SENNA 8.6 MG PO TABS
2.0000 | ORAL_TABLET | Freq: Every day | ORAL | Status: DC
  Administered 2014-04-23 – 2014-04-24 (×2): 17.2 mg via ORAL
  Filled 2014-04-23 (×2): qty 2

## 2014-04-23 MED ORDER — POLYVINYL ALCOHOL 1.4 % OP SOLN
2.0000 [drp] | Freq: Two times a day (BID) | OPHTHALMIC | Status: DC
  Administered 2014-04-23 – 2014-04-25 (×4): 2 [drp] via OPHTHALMIC
  Filled 2014-04-23: qty 15

## 2014-04-23 MED ORDER — POTASSIUM CHLORIDE CRYS ER 20 MEQ PO TBCR
20.0000 meq | EXTENDED_RELEASE_TABLET | Freq: Every day | ORAL | Status: DC
  Administered 2014-04-23: 20 meq via ORAL
  Filled 2014-04-23 (×2): qty 1

## 2014-04-23 MED ORDER — IPRATROPIUM-ALBUTEROL 0.5-2.5 (3) MG/3ML IN SOLN
3.0000 mL | Freq: Once | RESPIRATORY_TRACT | Status: AC
Start: 2014-04-23 — End: 2014-04-23
  Administered 2014-04-23: 3 mL via RESPIRATORY_TRACT
  Filled 2014-04-23: qty 3

## 2014-04-23 MED ORDER — ATENOLOL 100 MG PO TABS
100.0000 mg | ORAL_TABLET | Freq: Every day | ORAL | Status: DC
  Administered 2014-04-23 – 2014-04-25 (×3): 100 mg via ORAL
  Filled 2014-04-23 (×3): qty 1

## 2014-04-23 MED ORDER — ACETAMINOPHEN 500 MG PO TABS: 500.0000 mg | ORAL_TABLET | Freq: Four times a day (QID) | ORAL | Status: DC | PRN

## 2014-04-23 MED ORDER — SODIUM CHLORIDE 0.9 % IJ SOLN
3.0000 mL | Freq: Two times a day (BID) | INTRAMUSCULAR | Status: DC
  Administered 2014-04-23 – 2014-04-25 (×4): 3 mL via INTRAVENOUS

## 2014-04-23 MED ORDER — ONDANSETRON HCL 4 MG/2ML IJ SOLN: 4.0000 mg | Freq: Four times a day (QID) | INTRAMUSCULAR | Status: DC | PRN

## 2014-04-23 MED ORDER — IPRATROPIUM-ALBUTEROL 0.5-2.5 (3) MG/3ML IN SOLN
3.0000 mL | Freq: Once | RESPIRATORY_TRACT | Status: AC
  Administered 2014-04-23: 3 mL via RESPIRATORY_TRACT
  Filled 2014-04-23: qty 3

## 2014-04-23 MED ORDER — DOCUSATE SODIUM 100 MG PO CAPS
100.0000 mg | ORAL_CAPSULE | Freq: Two times a day (BID) | ORAL | Status: DC
  Administered 2014-04-23 – 2014-04-25 (×5): 100 mg via ORAL
  Filled 2014-04-23 (×6): qty 1

## 2014-04-23 MED ORDER — GABAPENTIN 300 MG PO CAPS
300.0000 mg | ORAL_CAPSULE | Freq: Three times a day (TID) | ORAL | Status: DC
  Administered 2014-04-23 – 2014-04-25 (×7): 300 mg via ORAL
  Filled 2014-04-23 (×9): qty 1

## 2014-04-23 MED ORDER — POLYETHYLENE GLYCOL 3350 17 G PO PACK
17.0000 g | PACK | Freq: Every day | ORAL | Status: DC
  Administered 2014-04-23 – 2014-04-25 (×2): 17 g via ORAL
  Filled 2014-04-23 (×3): qty 1

## 2014-04-23 MED ORDER — WARFARIN SODIUM 5 MG PO TABS
5.0000 mg | ORAL_TABLET | Freq: Once | ORAL | Status: AC
  Administered 2014-04-23: 5 mg via ORAL
  Filled 2014-04-23: qty 1

## 2014-04-23 MED ORDER — IOHEXOL 350 MG/ML SOLN
100.0000 mL | Freq: Once | INTRAVENOUS | Status: AC | PRN
  Administered 2014-04-23: 100 mL via INTRAVENOUS

## 2014-04-23 MED ORDER — PANTOPRAZOLE SODIUM 40 MG PO TBEC
40.0000 mg | DELAYED_RELEASE_TABLET | Freq: Every day | ORAL | Status: DC
  Administered 2014-04-23 – 2014-04-25 (×3): 40 mg via ORAL
  Filled 2014-04-23 (×3): qty 1

## 2014-04-23 MED ORDER — LISINOPRIL 10 MG PO TABS
10.0000 mg | ORAL_TABLET | Freq: Every day | ORAL | Status: DC
  Administered 2014-04-23 – 2014-04-25 (×3): 10 mg via ORAL
  Filled 2014-04-23 (×3): qty 1

## 2014-04-23 MED ORDER — SUCRALFATE 1 G PO TABS
1.0000 g | ORAL_TABLET | Freq: Three times a day (TID) | ORAL | Status: DC
  Administered 2014-04-23 – 2014-04-25 (×9): 1 g via ORAL
  Filled 2014-04-23 (×11): qty 1

## 2014-04-23 MED ORDER — PERFLUTREN LIPID MICROSPHERE
1.0000 mL | INTRAVENOUS | Status: AC | PRN
  Administered 2014-04-23: 2 mL via INTRAVENOUS
  Filled 2014-04-23: qty 10

## 2014-04-23 MED ORDER — FLUTICASONE PROPIONATE HFA 220 MCG/ACT IN AERO
2.0000 | INHALATION_SPRAY | Freq: Every day | RESPIRATORY_TRACT | Status: DC
  Administered 2014-04-23 – 2014-04-25 (×3): 2 via RESPIRATORY_TRACT
  Filled 2014-04-23: qty 12

## 2014-04-23 MED ORDER — ALBUTEROL SULFATE (2.5 MG/3ML) 0.083% IN NEBU
2.5000 mg | INHALATION_SOLUTION | RESPIRATORY_TRACT | Status: DC | PRN
  Administered 2014-04-24: 2.5 mg via RESPIRATORY_TRACT
  Filled 2014-04-23: qty 3

## 2014-04-23 MED ORDER — TACROLIMUS 0.1 % EX OINT: 1.0000 "application " | TOPICAL_OINTMENT | Freq: Three times a day (TID) | CUTANEOUS | Status: DC | PRN

## 2014-04-23 MED ORDER — POLYVINYL ALCOHOL 1.4 % OP SOLN: 2.0000 [drp] | Freq: Two times a day (BID) | OPHTHALMIC | Status: DC

## 2014-04-23 MED ORDER — FLUOXETINE HCL 20 MG PO CAPS
60.0000 mg | ORAL_CAPSULE | Freq: Every day | ORAL | Status: DC
  Administered 2014-04-23 – 2014-04-25 (×3): 60 mg via ORAL
  Filled 2014-04-23 (×3): qty 3

## 2014-04-23 MED ORDER — HYDROXYZINE HCL 10 MG PO TABS
10.0000 mg | ORAL_TABLET | Freq: Every day | ORAL | Status: DC
  Administered 2014-04-23 – 2014-04-24 (×2): 10 mg via ORAL
  Filled 2014-04-23 (×3): qty 1

## 2014-04-23 MED ORDER — MONTELUKAST SODIUM 10 MG PO TABS
10.0000 mg | ORAL_TABLET | Freq: Every day | ORAL | Status: DC
  Administered 2014-04-23 – 2014-04-24 (×2): 10 mg via ORAL
  Filled 2014-04-23 (×3): qty 1

## 2014-04-23 MED ORDER — FUROSEMIDE 10 MG/ML IJ SOLN
20.0000 mg | Freq: Once | INTRAMUSCULAR | Status: AC
  Administered 2014-04-23: 20 mg via INTRAVENOUS
  Filled 2014-04-23: qty 4

## 2014-04-23 MED ORDER — FUROSEMIDE 10 MG/ML IJ SOLN
20.0000 mg | Freq: Two times a day (BID) | INTRAMUSCULAR | Status: DC
  Administered 2014-04-23 – 2014-04-24 (×2): 20 mg via INTRAVENOUS
  Filled 2014-04-23 (×4): qty 2

## 2014-04-23 MED ORDER — TRIAMCINOLONE ACETONIDE 0.1 % EX CREA
1.0000 "application " | TOPICAL_CREAM | Freq: Two times a day (BID) | CUTANEOUS | Status: DC | PRN
  Administered 2014-04-24: 1 via TOPICAL
  Filled 2014-04-23: qty 15

## 2014-04-23 MED ORDER — SODIUM CHLORIDE 0.9 % IV SOLN: 250.0000 mL | INTRAVENOUS | Status: DC | PRN

## 2014-04-23 MED ORDER — SODIUM CHLORIDE 0.9 % IJ SOLN: 3.0000 mL | INTRAMUSCULAR | Status: DC | PRN

## 2014-04-23 NOTE — H&P (Signed)
[facility] History and Physical  [name] [name] RUE:[id number] DOB: Dec 13, 1940 DOA: 04/23/2014  Referring physician:  Ross Marcus PCP:  [name], MD   The patient states that she has fired the [facility] although she has been seen recently in the resident clinic. She does not want to be admitted to the resident service and would like to have a new primary care doctor established.    Chief Complaint:  SOB  HPI:  The patient is a 73 y.o. year-old female with history of DVT/PE on chronic anticoagulation, asthma/chronic bronchitis, anxiety/depression, schizophrenia, GERD who presents with SOB.  The patient was last at their baseline health about three years ago.  She has had "bubbles" in her chest the last 3 years which have gotten progressively worse over the last week. She noticed some lower extremity swelling approximately one week ago which also has gotten progressively worse. She has dyspnea on exertion and has difficulty walking 20 feet without needing to stop to rest over the last week. She denies orthopnea and PND. She denies any fevers, chills, nausea, vomiting, diarrhea. She states she thinks she has been urinating less than usual the last week.   In the ER, VSS, however, when she ambulated her oxygen saturation dropped to 87%.  Labs:  hgb 10.1, BMP wnl, INR 2.36, troponin neg.  CXR with right hilar density, no effusion.  CT angio chest negative for PE, but prominent hilar and mediastinal vascular structures, right upper lobe ground glass opacity and dependent atelectasis.  Given duoneb and lasix 20mg  IV once in ER and being admitted for diuresis.    Review of Systems:  General:  Denies fevers, chills, weight loss.  Positive weight gain HEENT:  Denies changes to hearing and vision, rhinorrhea, sinus congestion, sore throat CV:  Denies chest pain and palpitations, positive lower extremity edema.  PULM:  Per HPI GI:  Denies nausea, vomiting, constipation, diarrhea.    GU:  Denies dysuria, frequency, urgency.  Decreased uop ENDO:  Denies polyuria, polydipsia.   HEME:  Denies hematemesis, blood in stools, melena, abnormal bruising or bleeding.  LYMPH:  Denies lymphadenopathy.   MSK:  chronic arthralgias, myalgias.   DERM:  Denies skin rash or ulcer.   NEURO:  Denies focal numbness, weakness, slurred speech, confusion, facial droop.  PSYCH:  Denies anxiety and depression.    Past Medical History  Diagnosis Date  . Depression   . Schizophrenia   . Hyperlipidemia   . Hypertension   . Chronic pain     "over my whole body" (03/30/2013)  . 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 DVT (deep venous thrombosis)     per 01/17/10 d/c summary- "remote hx of dvt"?  . Iron deficiency anemia   . Glaucoma   . Hemorrhoids   . Asthma   . Anxiety   . GERD (gastroesophageal reflux disease)   . Psoriasis 04/29/2012    New onset evaluated by Dr Marylou Flesher, Dermatology, [facility] 6/13  Rx 0.1% Tacrolimus ointment  . Complication of anesthesia     "because I have sleep apnea" (03/30/2013)  . Heart murmur   . Chest pain, exertional   . Chronic bronchitis   . Sleep apnea     "waiting on my CPAP" (03/30/2013)  . Pneumonia     "a few times" (03/30/2013)  . Exertional shortness of breath     "& sometimes when laying down" (03/30/2013)  . Daily headache     "  last 2 months" (03/30/2013)  . Arthritis     "joints" (03/30/2013)  . Varicose veins of legs   . Psoriasis    Past Surgical History  Procedure Laterality Date  . Spinal fusion      2004  . Carpal tunnel release Bilateral   . Tubal ligation  1973  . Dilation and curettage of uterus  1970's    "once" (03/30/2013)  . 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 Joint replacement    . Vein surgery  left leg   Social History:  reports that she has never smoked. She has never used smokeless tobacco. She reports that she does not drink alcohol or use illicit drugs. Cares for her son who has severe  epilepsy and is feeding tube dependent.    Allergies  Allergen Reactions  . Other     Pt states she allergic to a lot of generic medicines.    Family History  Problem Relation Age of Onset  . Diabetes Sister   . Colon cancer Brother 40  . Colon cancer Brother      Prior to Admission medications   Medication Sig Start Date End Date Taking? Authorizing Provider  acetaminophen (TYLENOL) 500 MG tablet Take 500 mg by mouth every 6 (six) hours as needed for mild pain.   Yes Historical Provider, MD  albuterol (PROVENTIL HFA;VENTOLIN HFA) 108 (90 BASE) MCG/ACT inhaler Inhale 2 puffs into the lungs every 4 (four) hours as needed for wheezing or shortness of breath. 10/21/13  Yes Hayden Rasmussenavid Mabe, NP  atenolol (TENORMIN) 100 MG tablet Take 1 tablet (100 mg total) by mouth daily. 01/29/14  Yes Hazeline Junkeryan Grunz, MD  esomeprazole (NEXIUM) 20 MG capsule Take 20 mg by mouth daily. Pt uses Nexium 24hour OTC product.   Yes Historical Provider, MD  FLUoxetine (PROZAC) 20 MG capsule Take 60 mg by mouth daily.   Yes Historical Provider, MD  fluticasone (FLOVENT HFA) 220 MCG/ACT inhaler Inhale 2 puffs into the lungs daily.    Yes Historical Provider, MD  gabapentin (NEURONTIN) 300 MG capsule Take 1 capsule (300 mg total) by mouth 3 (three) times daily. 04/05/14  Yes Jacquelin Hawkingalph Nettey, MD  hydrochlorothiazide (HYDRODIURIL) 25 MG tablet Take 1 tablet (25 mg total) by mouth every morning. 01/29/14  Yes Hazeline Junkeryan Grunz, MD  hydrOXYzine (ATARAX/VISTARIL) 10 MG tablet Take 10 mg by mouth at bedtime.  03/01/14  Yes Historical Provider, MD  lisinopril (PRINIVIL,ZESTRIL) 10 MG tablet Take 1 tablet (10 mg total) by mouth daily. 01/29/14  Yes Hazeline Junkeryan Grunz, MD  polyvinyl alcohol (ARTIFICIAL TEARS) 1.4 % ophthalmic solution Place 2 drops into both eyes 2 (two) times daily.   Yes Historical Provider, MD  silver sulfADIAZINE (SILVADENE) 1 % cream Apply 1 application topically daily. Apply to affected areas for shingles 02/23/14  Yes Jess BartersJennifer Lee Presson, PA   sucralfate (CARAFATE) 1 G tablet Take 1 g by mouth 4 (four) times daily.    Yes Historical Provider, MD  tacrolimus (PROTOPIC) 0.1 % ointment Apply 1 application topically 3 (three) times daily as needed (for legs). Apply to affected areas daily PRN 03/01/14 03/01/15 Yes Historical Provider, MD  triamcinolone cream (KENALOG) 0.1 % Apply 1 application topically 2 (two) times daily as needed (for legs).    Yes Historical Provider, MD  warfarin (COUMADIN) 5 MG tablet Take 1 tablet (5 mg total) by mouth daily. 01/28/14  Yes Levert FeinsteinJames M Granfortuna, MD   Physical Exam: Filed Vitals:   04/23/14 0730 04/23/14 0735 04/23/14 1009 04/23/14  1131  BP:  135/73 125/73 142/82  Pulse:  62 57 53  Temp:  98.6 F (37 C)    TempSrc:  Oral    Resp:  16 14 11   SpO2: 94% 95% 94% 97%     General:  Obese BF, NAD  Eyes:  PERRL, anicteric, non-injected.  ENT:  Nares clear.  OP clear, non-erythematous without plaques or exudates.  MMM.  Neck:  Supple without TM or JVD.    Lymph:  No cervical, supraclavicular, or submandibular LAD.  Cardiovascular:  RRR, normal S1, S2, no obvious murmurs, rubs or gallops.  2+ pulses, warm extremities  Respiratory:  Faint rales at bilateral bases, no wheezing or rhonchi, (after breathing tx and lasix), no increased WOB at rest  Abdomen:  NABS.  Soft, ND/NT.    Skin:  No rashes or focal lesions.  Musculoskeletal:  Normal bulk and tone.  1+ bilateral LE edema.  Psychiatric:  A & O x 4.  Appropriate affect.  Neurologic:  CN 3-12 intact.  5/5 strength.  Sensation intact.  Labs on Admission:  Basic Metabolic Panel:  Recent Labs Lab 04/23/14 0754  NA 138  K 3.7  CL 98  CO2 27  GLUCOSE 124*  BUN 8  CREATININE 0.88  CALCIUM 9.0   Liver Function Tests: No results found for this basename: AST, ALT, ALKPHOS, BILITOT, PROT, ALBUMIN,  in the last 168 hours No results found for this basename: LIPASE, AMYLASE,  in the last 168 hours No results found for this basename:  AMMONIA,  in the last 168 hours CBC:  Recent Labs Lab 04/23/14 0754  WBC 6.1  NEUTROABS 4.1  HGB 10.1*  HCT 30.7*  MCV 95.3  PLT 241   Cardiac Enzymes:  Recent Labs Lab 04/23/14 0754  TROPONINI <0.30    BNP (last 3 results)  Recent Labs  04/23/14 0754  PROBNP 707.5*   CBG: No results found for this basename: GLUCAP,  in the last 168 hours  Radiological Exams on Admission: Dg Chest 2 View  04/23/2014   CLINICAL DATA:  Shortness of breath.  EXAM: CHEST  2 VIEW  COMPARISON:  Feb 02, 2014.  FINDINGS: Stable cardiac silhouette. However, increased right hilar density is noted concerning for possible mass or adenopathy. Left lung is clear. No pneumothorax or significant pleural effusion is noted. Bony thorax is intact.  IMPRESSION: Increased right hilar density is noted concerning for neoplasm or adenopathy. CT scan of the chest with contrast is recommended for further evaluation   Electronically Signed   By: Roque Lias M.D.   On: 04/23/2014 08:47   Ct Angio Chest W/cm &/or Wo Cm  04/23/2014   CLINICAL DATA:  Shortness of breath for 4 days, nonproductive dry cough for 3 years, history pulmonary embolism, abnormal chest radiograph with RIGHT hilar/suprahilar enlargement  EXAM: CT ANGIOGRAPHY CHEST WITH CONTRAST  TECHNIQUE: Multidetector CT imaging of the chest was performed using the standard protocol during bolus administration of intravenous contrast. Multiplanar CT image reconstructions and MIPs were obtained to evaluate the vascular anatomy.  CONTRAST:  OMNIPAQUE IOHEXOL 350 MG/ML SOLN  COMPARISON:  CT chest 12/01/2013, chest radiograph 04/23/2014  FINDINGS: Mild scattered atherosclerotic calcification.  Aorta normal caliber without aneurysm or dissection.  Visualized upper abdomen unremarkable.  No thoracic adenopathy.  Scattered artifacts from respiratory motion and beam hardening secondary to body habitus.  Pulmonary arteries grossly patent.  No evidence of pulmonary  embolism.  Dependent atelectasis in lower lobes.  Minimal peribronchial thickening.  Abnormality at the RIGHT hilum on chest radiography appear secondary to hilar and mediastinal vascular structures ; no pulmonary mass identified.  Minimal nonspecific ground-glass opacity peripherally in the RIGHT upper lobe.  No additional pulmonary infiltrate, pleural effusion, pneumothorax, or acute osseous findings.  Multilevel degenerative disc disease changes thoracic spine.  Review of the MIP images confirms the above findings.  IMPRESSION: No evidence pulmonary embolism.  Chest radiographic abnormality is due to prominent hilar and mediastinal vascular structures.  Minimal nonspecific ground-glass opacity in the RIGHT upper lobe new since previous exam, with associated decrease in lung volumes, question related to minimal atelectasis or infiltrate.  Dependent atelectasis in lower lobes.   Electronically Signed   By: Ulyses Southward M.D.   On: 04/23/2014 09:49    EKG: Independently reviewed. NSR  Assessment/Plan Active Problems:   DEPRESSION   ASTHMA, PERSISTENT   Encounter for long-term (current) use of anticoagulants   Iron deficiency anemia, unspecified   Vertigo   MDD (major depressive disorder), single episode, severe   Hypoxia  ---  Acute hypoxic respiratory failure due to probable acute on chronic diastolic heart failure suggested by lower extremity edema and vascular congestion.  No fevers or leukocytosis to suggest pneumonia.  Previously preserved EF with grade 1 DD on ECHO from last year.  Eval for nephrotic syndrome.   -  UA -  Check albumin -  Check procalcitonin and if positive, consider tx for CAP -  ECHO -  Daily weights and strict I/O -  Lasix 20mg  IV BID  -  Heart failure education -  Nutrition consult for diet education  Asthma, stable.   -  Continue flovent, singulair, and prn albuterol -  Consider prednisone burst if breathing not improving with diuresis  Hx PE/DVT on chronic  A/C -  Warfarin per pharm -  INR therapeutic  Depression/anxiety/schizophrenia, stable, continue home medications  HTN/HLD, BP stable.  Continue atenolol and lisinopril.  Hold HCTZ while diuresing with lasix  Normocytic anemia, likely anemia of chronic disease, occult negative -  Repeat hgb in AM  Diet:  Low salt Access:  PIV IVF:  off Proph:  warfarin  Code Status: DNR  Family Communication: patient alone Disposition Plan: Admit to telemetry  Time spent: 60 min Renae Fickle [facility] Pager [phone]  If 7PM-7AM, please contact night-coverage [email or web address] Password [facility] 04/23/2014, 12:57 PM

## 2014-04-23 NOTE — ED Notes (Addendum)
Pt ambulatory to restroom with minimal assistance. Pulse ox between 87-91% while ambulating. Patient denies shortness of breath at this time

## 2014-04-23 NOTE — ED Notes (Signed)
Bed: WA03 Expected date:  Expected time:  Means of arrival:  Comments: EMS-SOB 

## 2014-04-23 NOTE — Progress Notes (Signed)
Assumed care of pt. No changes in initial am assessment. Cont with plan of care 

## 2014-04-23 NOTE — ED Provider Notes (Signed)
CSN: [id number]     Arrival date & time 04/23/14  0730 History   First MD Initiated Contact with Patient 04/23/14 [phone]     Chief Complaint  Patient presents with  . Shortness of Breath     (Consider location/radiation/quality/duration/timing/severity/associated sxs/prior Treatment) HPI  This is a 73 year old female with a history of hypertension, hyperlipidemia, pulmonary embolism on Coumadin who presents with shortness of breath. Patient reports 3 to four-day history of worsening dyspnea on exertion and bilateral leg swelling left greater than right. She reports that this feels like when she had her PE 4 years ago. She reports dry cough daily. Denies any fevers. Denies any history of heart failure. Denies any chest pain. She does state that she does wheeze occasionally and that an inhaler helps. She denies orthopnea.  Past Medical History  Diagnosis Date  . Depression   . Schizophrenia   . Hyperlipidemia   . Hypertension   . Chronic pain     "over my whole body" (03/30/2013)  . 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 DVT (deep venous thrombosis)     per 01/17/10 d/c summary- "remote hx of dvt"?  . Iron deficiency anemia   . Glaucoma   . Hemorrhoids   . Asthma   . Anxiety   . GERD (gastroesophageal reflux disease)   . Psoriasis 04/29/2012    New onset evaluated by Dr Marylou Flesher, Dermatology, [facility] 6/13  Rx 0.1% Tacrolimus ointment  . Complication of anesthesia     "because I have sleep apnea" (03/30/2013)  . Heart murmur   . Chest pain, exertional   . Chronic bronchitis   . Sleep apnea     "waiting on my CPAP" (03/30/2013)  . Pneumonia     "a few times" (03/30/2013)  . Exertional shortness of breath     "& sometimes when laying down" (03/30/2013)  . Daily headache     "last 2 months" (03/30/2013)  . Arthritis     "joints" (03/30/2013)  . Varicose veins of legs   . Psoriasis    Past Surgical History  Procedure Laterality Date  . Spinal fusion     2004  . Carpal tunnel release Bilateral   . Tubal ligation  1973  . Dilation and curettage of uterus  1970's    "once" (03/30/2013)  . 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 Joint replacement     Family History  Problem Relation Age of Onset  . Diabetes Sister   . Colon cancer Brother 40   History  Substance Use Topics  . Smoking status: Never Smoker   . Smokeless tobacco: Never Used  . Alcohol Use: No   OB History   Grav Para Term Preterm Abortions TAB SAB Ect Mult Living                 Review of Systems  Constitutional: Negative for fever.  Respiratory: Positive for cough, shortness of breath and wheezing. Negative for chest tightness.   Cardiovascular: Positive for leg swelling. Negative for chest pain.  Gastrointestinal: Negative for nausea, vomiting and abdominal pain.  Genitourinary: Negative for dysuria.  Neurological: Negative for headaches.  All other systems reviewed and are negative.     Allergies  Other  Home Medications   Prior to Admission medications   Medication Sig Start Date End Date Taking? Authorizing Provider  acetaminophen (TYLENOL) 500 MG tablet Take 500 mg by mouth every 6 (six) hours as needed for  mild pain.   Yes Historical Provider, MD  albuterol (PROVENTIL HFA;VENTOLIN HFA) 108 (90 BASE) MCG/ACT inhaler Inhale 2 puffs into the lungs every 4 (four) hours as needed for wheezing or shortness of breath. 10/21/13  Yes Hayden Rasmussenavid Mabe, NP  atenolol (TENORMIN) 100 MG tablet Take 1 tablet (100 mg total) by mouth daily. 01/29/14  Yes Hazeline Junkeryan Grunz, MD  esomeprazole (NEXIUM) 20 MG capsule Take 20 mg by mouth daily. Pt uses Nexium 24hour OTC product.   Yes Historical Provider, MD  FLUoxetine (PROZAC) 20 MG capsule Take 60 mg by mouth daily.   Yes Historical Provider, MD  fluticasone (FLOVENT HFA) 220 MCG/ACT inhaler Inhale 2 puffs into the lungs daily.    Yes Historical Provider, MD  gabapentin (NEURONTIN) 300 MG capsule Take 1 capsule (300 mg total) by mouth 3  (three) times daily. 04/05/14  Yes Jacquelin Hawkingalph Nettey, MD  hydrochlorothiazide (HYDRODIURIL) 25 MG tablet Take 1 tablet (25 mg total) by mouth every morning. 01/29/14  Yes Hazeline Junkeryan Grunz, MD  hydrOXYzine (ATARAX/VISTARIL) 10 MG tablet Take 10 mg by mouth at bedtime.  03/01/14  Yes Historical Provider, MD  lisinopril (PRINIVIL,ZESTRIL) 10 MG tablet Take 1 tablet (10 mg total) by mouth daily. 01/29/14  Yes Hazeline Junkeryan Grunz, MD  polyvinyl alcohol (ARTIFICIAL TEARS) 1.4 % ophthalmic solution Place 2 drops into both eyes 2 (two) times daily.   Yes Historical Provider, MD  silver sulfADIAZINE (SILVADENE) 1 % cream Apply 1 application topically daily. Apply to affected areas for shingles 02/23/14  Yes Jess BartersJennifer Lee Presson, PA  sucralfate (CARAFATE) 1 G tablet Take 1 g by mouth 4 (four) times daily.    Yes Historical Provider, MD  tacrolimus (PROTOPIC) 0.1 % ointment Apply 1 application topically 3 (three) times daily as needed (for legs). Apply to affected areas daily PRN 03/01/14 03/01/15 Yes Historical Provider, MD  triamcinolone cream (KENALOG) 0.1 % Apply 1 application topically 2 (two) times daily as needed (for legs).    Yes Historical Provider, MD  warfarin (COUMADIN) 5 MG tablet Take 1 tablet (5 mg total) by mouth daily. 01/28/14  Yes Levert FeinsteinJames M Granfortuna, MD   BP 142/82  Pulse 53  Temp(Src) 98.6 F (37 C) (Oral)  Resp 11  SpO2 97%  LMP 11/22/1996 Physical Exam  Nursing note and vitals reviewed. Constitutional: She is oriented to person, place, and time. No distress.  Elderly, no acute distress  HENT:  Head: Normocephalic and atraumatic.  Mouth/Throat: Oropharynx is clear and moist.  Eyes: Pupils are equal, round, and reactive to light.  Neck: Neck supple.  Cardiovascular: Normal rate, regular rhythm and normal heart sounds.   Pulmonary/Chest: Effort normal. No respiratory distress. She has wheezes.  Abdominal: Soft. Bowel sounds are normal. There is no tenderness. There is no rebound.  Musculoskeletal:  3+ pitting  edema left lower extremity, 2+ pitting edema right lower extremity, chronic venous stasis changes  Neurological: She is alert and oriented to person, place, and time.  Skin: Skin is warm and dry.  Psychiatric: She has a normal mood and affect.    ED Course  Procedures (including critical care time) Labs Review Labs Reviewed  CBC WITH DIFFERENTIAL - Abnormal; Notable for the following:    RBC 3.22 (*)    Hemoglobin 10.1 (*)    HCT 30.7 (*)    All other components within normal limits  BASIC METABOLIC PANEL - Abnormal; Notable for the following:    Glucose, Bld 124 (*)    GFR calc non Af Amer 65 (*)  GFR calc Af Amer 75 (*)    All other components within normal limits  PRO B NATRIURETIC PEPTIDE - Abnormal; Notable for the following:    Pro B Natriuretic peptide (BNP) 707.5 (*)    All other components within normal limits  PROTIME-INR - Abnormal; Notable for the following:    Prothrombin Time 25.8 (*)    INR 2.36 (*)    All other components within normal limits  TROPONIN I  POC OCCULT BLOOD, ED  POC OCCULT BLOOD, ED    Imaging Review Dg Chest 2 View  04/23/2014   CLINICAL DATA:  Shortness of breath.  EXAM: CHEST  2 VIEW  COMPARISON:  Feb 02, 2014.  FINDINGS: Stable cardiac silhouette. However, increased right hilar density is noted concerning for possible mass or adenopathy. Left lung is clear. No pneumothorax or significant pleural effusion is noted. Bony thorax is intact.  IMPRESSION: Increased right hilar density is noted concerning for neoplasm or adenopathy. CT scan of the chest with contrast is recommended for further evaluation   Electronically Signed   By: Roque Lias M.D.   On: 04/23/2014 08:47   Ct Angio Chest W/cm &/or Wo Cm  04/23/2014   CLINICAL DATA:  Shortness of breath for 4 days, nonproductive dry cough for 3 years, history pulmonary embolism, abnormal chest radiograph with RIGHT hilar/suprahilar enlargement  EXAM: CT ANGIOGRAPHY CHEST WITH CONTRAST  TECHNIQUE:  Multidetector CT imaging of the chest was performed using the standard protocol during bolus administration of intravenous contrast. Multiplanar CT image reconstructions and MIPs were obtained to evaluate the vascular anatomy.  CONTRAST:  OMNIPAQUE IOHEXOL 350 MG/ML SOLN  COMPARISON:  CT chest 12/01/2013, chest radiograph 04/23/2014  FINDINGS: Mild scattered atherosclerotic calcification.  Aorta normal caliber without aneurysm or dissection.  Visualized upper abdomen unremarkable.  No thoracic adenopathy.  Scattered artifacts from respiratory motion and beam hardening secondary to body habitus.  Pulmonary arteries grossly patent.  No evidence of pulmonary embolism.  Dependent atelectasis in lower lobes.  Minimal peribronchial thickening.  Abnormality at the RIGHT hilum on chest radiography appear secondary to hilar and mediastinal vascular structures ; no pulmonary mass identified.  Minimal nonspecific ground-glass opacity peripherally in the RIGHT upper lobe.  No additional pulmonary infiltrate, pleural effusion, pneumothorax, or acute osseous findings.  Multilevel degenerative disc disease changes thoracic spine.  Review of the MIP images confirms the above findings.  IMPRESSION: No evidence pulmonary embolism.  Chest radiographic abnormality is due to prominent hilar and mediastinal vascular structures.  Minimal nonspecific ground-glass opacity in the RIGHT upper lobe new since previous exam, with associated decrease in lung volumes, question related to minimal atelectasis or infiltrate.  Dependent atelectasis in lower lobes.   Electronically Signed   By: Ulyses Southward M.D.   On: 04/23/2014 09:49     EKG Interpretation   Date/Time:  Friday April 23 2014 07:34:59 EDT Ventricular Rate:  61 PR Interval:  185 QRS Duration: 89 QT Interval:  436 QTC Calculation: 439 R Axis:   44 Text Interpretation:  Sinus rhythm Abnormal R-wave progression, early  transition Confirmed by Rondel Episcopo  MD, Cortni Tays ([id number])  on 04/23/2014 7:41:29  AM      MDM   Final diagnoses:  Hypoxia  Shortness of breath    Patient presents with shortness of breath and worsening dyspnea on exertion. Also reports worsening swelling of her lower extremity. History of daily. No known history of COPD. She does have wheezing bilaterally. She's given a duo neb. Basic labwork  was obtained. EKG is nonischemic. Troponin is negative. BNP is mildly elevated in the 700s. No known history of CHF. She is therapeutic on her Coumadin. Chest x-ray is concerning for a perihilar mass and recommend CT.  CT PE protocol obtained to evaluate this mass and for PE. This is reassuring. Given lower committee edema and mildly elevated BNP, the patient shortness of breath may be related to volume overload even though her chest x-ray appears mostly clear.  Patient was ambulated with pulse ox and dropped her O2 sats to 85%. She is on home oxygen. Patient was given 20 mg IV Lasix. Will admit for diuresis and echo.    Shon Baton, MD 04/23/14 [phone]

## 2014-04-23 NOTE — ED Notes (Addendum)
Pt reports SOB x4 days. Pt been using an inhaler w/o relief. Pt states that she has had a non-productive, dry cough for 3 years. Pt denies CP, N/V/D, fever. Pt has bilateral expiratory wheezing. Pt last BM was 2 days ago. Pt is A&O and in NAD. Pt adds that she has hx of PE. Pt has +3 pitting edema to LLE and +1 pitting edema to RLE

## 2014-04-23 NOTE — Progress Notes (Signed)
ANTICOAGULATION CONSULT NOTE - Initial Consult  Pharmacy Consult for warfarin Indication: hx DVT/PE, hx factor V ledin  Allergies  Allergen Reactions  . Other     Pt states she allergic to a lot of generic medicines.    Patient Measurements: Height: 5\' 6"  (167.6 cm) Weight: 257 lb 15 oz (117 kg) IBW/kg (Calculated) : 59.3  Vital Signs: Temp: 98 F (36.7 C) (07/31 1304) Temp src: Oral (07/31 1304) BP: 158/74 mmHg (07/31 1304) Pulse Rate: 57 (07/31 1304)  Labs:  Recent Labs  04/23/14 0754  HGB 10.1*  HCT 30.7*  PLT 241  LABPROT 25.8*  INR 2.36*  CREATININE 0.88  TROPONINI <0.30    Estimated Creatinine Clearance: 76.3 ml/min (by C-G formula based on Cr of 0.88).   Medical History: Past Medical History  Diagnosis Date  . Depression   . Schizophrenia   . Hyperlipidemia   . Hypertension   . Chronic pain     "over my whole body" (03/30/2013)  . 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 DVT (deep venous thrombosis)     per 01/17/10 d/c summary- "remote hx of dvt"?  . Iron deficiency anemia   . Glaucoma   . Hemorrhoids   . Asthma   . Anxiety   . GERD (gastroesophageal reflux disease)   . Psoriasis 04/29/2012    New onset evaluated by Dr Marylou FlesherWilliam Huang, [facility]  Rx 0.1% Tacrolimus ointment  . Complication of anesthesia     "because I have sleep apnea" (03/30/2013)  . Heart murmur   . Chest pain, exertional   . Chronic bronchitis   . Sleep apnea     "waiting on my CPAP" (03/30/2013)  . Pneumonia     "a few times" (03/30/2013)  . Exertional shortness of breath     "& sometimes when laying down" (03/30/2013)  . Daily headache     "last 2 months" (03/30/2013)  . Arthritis     "joints" (03/30/2013)  . Varicose veins of legs   . Psoriasis     Medications:  Scheduled:  . atenolol  100 mg Oral Daily  . FLUoxetine  60 mg Oral Daily  . fluticasone  2 puff Inhalation Daily  . furosemide  20 mg Intravenous BID  . gabapentin  300  mg Oral TID  . hydrOXYzine  10 mg Oral QHS  . lisinopril  10 mg Oral Daily  . montelukast  10 mg Oral QHS  . pantoprazole  40 mg Oral Daily  . polyvinyl alcohol  2 drop Both Eyes BID  . potassium chloride  20 mEq Oral Daily  . sodium chloride  3 mL Intravenous Q12H  . sucralfate  1 g Oral TID AC & HS   Infusions:    Assessment: 73 yo female presented to ER with SOB. Patient with hx DVT/PE and factor V leiden reportedly on warfarin 5mg  daily PTA with last dose 7/30. Patient also with hx asthma, anxiety/depression, schizophrenia and GERD. Baseline INR 2.36 this AM. Hgb ok.  Goal of Therapy:  INR 2-3   Plan:  1) Continue with home dose of warfarin tonight - 5mg  2) Daily INR  Hessie KnowsJustin M Burna Atlas, PharmD, BCPS Pager [phone] 04/23/2014 1:48 PM

## 2014-04-23 NOTE — ED Notes (Signed)
Pt from home via EMS- Per EMS pt reports SOB x4 days and increased swelling to bilateral extremeties. Pt reports hx of PE 4 years ago. Pt denies pain. Pt is A&O and in NAD

## 2014-04-23 NOTE — Progress Notes (Signed)
Nutrition Education Note  RD consulted for nutrition education regarding low sodium diet for CHF.  RD provided "Heart Failure Nutrition Therapy" handout from the [employer]. Reviewed patient's dietary recall. Provided examples on ways to decrease sodium intake in diet. Discouraged intake of processed foods and use of salt shaker.   RD discussed why it is important for patient to adhere to diet recommendations and emphasized foods to avoid. Pt admits to putting a lot of salt of her foods recently, even this week she put a lot of salt on her watermelon, and said she didn't realize how bad it was for her. Said her son does the cooking at home.   Expect good compliance.  Body mass index is 41.65 kg/(m^2). 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  Current diet order is 2g sodium diet, patient is consuming approximately 75% of meals at this time. Labs and medications reviewed. No further nutrition interventions warranted at this time. RD contact information provided. If additional nutrition issues arise, please re-consult RD.   [name] RakesHeather Paz Fuentes MS, RD, LDN [phone] Pager [phone] Weekend/After Hours Pager

## 2014-04-23 NOTE — ED Notes (Signed)
Pt in CT.

## 2014-04-24 DIAGNOSIS — I5033 Acute on chronic diastolic (congestive) heart failure: Principal | ICD-10-CM

## 2014-04-24 DIAGNOSIS — I10 Essential (primary) hypertension: Secondary | ICD-10-CM

## 2014-04-24 LAB — CBC
HCT: 33.4 % — ABNORMAL LOW (ref 36.0–46.0)
Hemoglobin: 10.8 g/dL — ABNORMAL LOW (ref 12.0–15.0)
MCH: 30.7 pg (ref 26.0–34.0)
MCHC: 32.3 g/dL (ref 30.0–36.0)
MCV: 94.9 fL (ref 78.0–100.0)
Platelets: 284 10*3/uL (ref 150–400)
RBC: 3.52 MIL/uL — ABNORMAL LOW (ref 3.87–5.11)
RDW: 15.1 % (ref 11.5–15.5)
WBC: 6.3 10*3/uL (ref 4.0–10.5)

## 2014-04-24 LAB — BASIC METABOLIC PANEL
ANION GAP: 10 (ref 5–15)
BUN: 5 mg/dL — ABNORMAL LOW (ref 6–23)
CO2: 31 meq/L (ref 19–32)
Calcium: 9.4 mg/dL (ref 8.4–10.5)
Chloride: 99 mEq/L (ref 96–112)
Creatinine, Ser: 0.89 mg/dL (ref 0.50–1.10)
GFR calc Af Amer: 74 mL/min — ABNORMAL LOW (ref >90)
GFR calc non Af Amer: 64 mL/min — ABNORMAL LOW (ref >90)
GLUCOSE: 113 mg/dL — AB (ref 70–99)
POTASSIUM: 3.5 meq/L — AB (ref 3.7–5.3)
SODIUM: 140 meq/L (ref 137–147)

## 2014-04-24 LAB — PROTIME-INR
INR: 2.68 — ABNORMAL HIGH (ref 0.00–1.49)
Prothrombin Time: 28.5 s — ABNORMAL HIGH (ref 11.6–15.2)

## 2014-04-24 MED ORDER — TRAMADOL HCL 50 MG PO TABS
50.0000 mg | ORAL_TABLET | Freq: Three times a day (TID) | ORAL | Status: DC
  Administered 2014-04-24 – 2014-04-25 (×4): 50 mg via ORAL
  Filled 2014-04-24 (×4): qty 1

## 2014-04-24 MED ORDER — POTASSIUM CHLORIDE CRYS ER 20 MEQ PO TBCR
40.0000 meq | EXTENDED_RELEASE_TABLET | Freq: Every day | ORAL | Status: DC
Start: 2014-04-24 — End: 2014-04-25
  Administered 2014-04-24 – 2014-04-25 (×2): 40 meq via ORAL
  Filled 2014-04-24 (×2): qty 2

## 2014-04-24 MED ORDER — ZOLPIDEM TARTRATE 5 MG PO TABS
5.0000 mg | ORAL_TABLET | Freq: Every evening | ORAL | Status: DC | PRN
Start: 2014-04-24 — End: 2014-04-25
  Administered 2014-04-24: 5 mg via ORAL
  Filled 2014-04-24: qty 1

## 2014-04-24 MED ORDER — OXYCODONE-ACETAMINOPHEN 5-325 MG PO TABS
1.0000 | ORAL_TABLET | Freq: Once | ORAL | Status: AC
  Administered 2014-04-24: 1 via ORAL
  Filled 2014-04-24: qty 1

## 2014-04-24 MED ORDER — WARFARIN SODIUM 5 MG PO TABS
5.0000 mg | ORAL_TABLET | Freq: Once | ORAL | Status: AC
Start: 2014-04-24 — End: 2014-04-24
  Administered 2014-04-24: 5 mg via ORAL
  Filled 2014-04-24: qty 1

## 2014-04-24 MED ORDER — FUROSEMIDE 10 MG/ML IJ SOLN
40.0000 mg | Freq: Two times a day (BID) | INTRAMUSCULAR | Status: DC
  Administered 2014-04-24 – 2014-04-25 (×2): 40 mg via INTRAVENOUS
  Filled 2014-04-24 (×3): qty 4

## 2014-04-24 NOTE — Progress Notes (Signed)
ANTICOAGULATION CONSULT NOTE - Follow-up Consult  Pharmacy Consult for warfarin Indication: Hx DVT/PE, FVL  Allergies  Allergen Reactions  . Other     Pt states she allergic to a lot of generic medicines.    Patient Measurements: Height: 5\' 6"  (167.6 cm) Weight: 250 lb 12.8 oz (113.762 kg) IBW/kg (Calculated) : 59.3  Vital Signs: Temp: 98.5 F (36.9 C) (08/01 1347) Temp src: Oral (08/01 1347) BP: 119/67 mmHg (08/01 1347) Pulse Rate: 61 (08/01 1347)  Labs:  Recent Labs  04/23/14 0754 04/24/14 0407  HGB 10.1* 10.8*  HCT 30.7* 33.4*  PLT 241 284  LABPROT 25.8* 28.5*  INR 2.36* 2.68*  CREATININE 0.88 0.89  TROPONINI <0.30  --     Estimated Creatinine Clearance: 74.2 ml/min (by C-G formula based on Cr of 0.89).   Medical History: Past Medical History  Diagnosis Date  . Depression   . Schizophrenia   . Hyperlipidemia   . Hypertension   . Chronic pain     "over my whole body" (03/30/2013)  . 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 DVT (deep venous thrombosis)     per 01/17/10 d/c summary- "remote hx of dvt"?  . Iron deficiency anemia   . Glaucoma   . Hemorrhoids   . Asthma   . Anxiety   . GERD (gastroesophageal reflux disease)   . Psoriasis 04/29/2012    New onset evaluated by Dr Marylou Flesher, Dermatology, [facility] 6/13  Rx 0.1% Tacrolimus ointment  . Complication of anesthesia     "because I have sleep apnea" (03/30/2013)  . Heart murmur   . Chest pain, exertional   . Chronic bronchitis   . Sleep apnea     "waiting on my CPAP" (03/30/2013)  . Pneumonia     "a few times" (03/30/2013)  . Exertional shortness of breath     "& sometimes when laying down" (03/30/2013)  . Daily headache     "last 2 months" (03/30/2013)  . Arthritis     "joints" (03/30/2013)  . Varicose veins of legs   . Psoriasis     Medications:  Scheduled:  . atenolol  100 mg Oral Daily  . docusate sodium  100 mg Oral BID  . FLUoxetine  60 mg Oral Daily  .  fluticasone  2 puff Inhalation Daily  . furosemide  40 mg Intravenous BID  . gabapentin  300 mg Oral TID  . hydrOXYzine  10 mg Oral QHS  . lisinopril  10 mg Oral Daily  . montelukast  10 mg Oral QHS  . pantoprazole  40 mg Oral Daily  . polyethylene glycol  17 g Oral Daily  . polyvinyl alcohol  2 drop Both Eyes BID  . potassium chloride  40 mEq Oral Daily  . senna  2 tablet Oral QHS  . sodium chloride  3 mL Intravenous Q12H  . sucralfate  1 g Oral TID AC & HS  . traMADol  50 mg Oral TID  . Warfarin - Pharmacist Dosing Inpatient   Does not apply q1800   Infusions:    Assessment: 73 yo female presented to ER with SOB, probably acute on chronic dHF (EF 55-60%). Patient with hx DVT/PE and factor V leiden reportedly on warfarin 5mg  daily PTA with last dose 7/30. Patient also with hx asthma, anxiety/depression, schizophrenia and GERD. Pharmacy consulted to resume warfarin dosing inpatient.   8/1: INR remains therapeutic.  CBC stable, no bleeding reported.  No major  DDIs noted.     Goal of Therapy:  INR 2-3   Plan:  1) Continue with home dose of warfarin tonight - 5mg  2) Daily INR  Haynes Hoehnolleen Celso Granja, PharmD, BCPS 04/24/2014, 2:29 PM  Pager: [phone]

## 2014-04-24 NOTE — Progress Notes (Addendum)
[facility] PROGRESS NOTE  [name] [name] ZOX:[id number] DOB: 1941/07/29 DOA: 04/23/2014 PCP: [name], MD  Assessment/Plan  Acute hypoxic respiratory failure due to probable acute on chronic diastolic heart failure suggested by lower extremity edema and vascular congestion. No fevers or leukocytosis to suggest pneumonia. Previously preserved EF with grade 1 DD on ECHO from last year. Eval for nephrotic syndrome.  - no proteinuria on UA - Albumin only mildly low - procalcitonin negative - ECHO:  EF 55-60% with no regional wall motion abnl, suggestion of diastolic dysfunction and moderate LVH.  PA peak pressure 44 mmHg - Wt 117 > 113 > - increase to Lasix 40mg  IV BID  - Heart failure education  - Nutrition consult for diet education  -  Tele:  NSR, okay to d/c telemetry -  Did teach back with patient regarding about of sodium per day.  Patient had difficulty remembering but seems eager to learn.  Expect fair to poor compliance at this time.  Will review again tomorrow and talk about importance of checking weight each day and taking lasix reliably at next visit.    Asthma, stable.  - Continue flovent, singulair, and prn albuterol  - Consider prednisone burst if breathing not improving with diuresis   Hx PE/DVT on chronic A/C  - Warfarin per pharm  - INR therapeutic   Depression/anxiety/schizophrenia, stable, continue home medications   HTN/HLD, BP stable. Continue atenolol and lisinopril. Hold HCTZ while diuresing with lasix   Normocytic anemia, likely anemia of chronic disease, occult negative  - Repeat hgb in AM   Hypokalemia, start oral potassium repletion  Diet: Low salt  Access: PIV  IVF: off  Proph: warfarin   Code Status: DNR  Family Communication: patient alone  Disposition Plan:  Pending euvolemia, likely in a few days.  Continue education about heart failure.     Consultants:  None  Procedures:  CXR  CT angio chest  ECHO  Antibiotics:  none    HPI/Subjective:  Breathing is easier and swelling in belly and legs is coming down.  Eating okay.    Objective: Filed Vitals:   04/23/14 2033 04/23/14 2043 04/24/14 0623 04/24/14 0809  BP: 184/89 164/80 134/63   Pulse: 63  66   Temp: 98.1 F (36.7 C)  99.1 F (37.3 C)   TempSrc: Oral  Oral   Resp: 18  18   Height:      Weight:   113.762 kg (250 lb 12.8 oz)   SpO2: 99%  94% 96%    Intake/Output Summary (Last 24 hours) at 04/24/14 1342 Last data filed at 04/24/14 1300  Gross per 24 hour  Intake    480 ml  Output   3800 ml  Net  -3320 ml   Filed Weights   04/23/14 1301 04/24/14 0623  Weight: 117 kg (257 lb 15 oz) 113.762 kg (250 lb 12.8 oz)    Exam:   General:  CF, No acute distress  HEENT:  NCAT, MMM  Cardiovascular:  RRR, nl S1, S2, no murmurs, 2+ pulses, warm extremities  Respiratory:  Rales at bilateral bases, no wheezes or rhonchi, no increased WOB  Abdomen:   NABS, soft, NT/ND  MSK:   Normal tone and bulk, 1+ bilateral LEE  Neuro:  Grossly intact  Data Reviewed: Basic Metabolic Panel:  Recent Labs Lab 04/23/14 0754 04/24/14 0407  NA 138 140  K 3.7 3.5*  CL 98 99  CO2 27 31  GLUCOSE 124* 113*  BUN 8  5*  CREATININE 0.88 0.89  CALCIUM 9.0 9.4   Liver Function Tests:  Recent Labs Lab 04/23/14 0800  AST 43*  ALT 23  ALKPHOS 102  BILITOT <0.2*  PROT 7.5  ALBUMIN 3.2*   No results found for this basename: LIPASE, AMYLASE,  in the last 168 hours No results found for this basename: AMMONIA,  in the last 168 hours CBC:  Recent Labs Lab 04/23/14 0754 04/24/14 0407  WBC 6.1 6.3  NEUTROABS 4.1  --   HGB 10.1* 10.8*  HCT 30.7* 33.4*  MCV 95.3 94.9  PLT 241 284   Cardiac Enzymes:  Recent Labs Lab 04/23/14 0754  TROPONINI <0.30   BNP (last 3 results)  Recent Labs  04/23/14 0754  PROBNP 707.5*   CBG: No results found for this basename: GLUCAP,  in the last 168 hours  No results found for this or any previous visit (from  the past 240 hour(s)).   Studies: Dg Chest 2 View  04/23/2014   CLINICAL DATA:  Shortness of breath.  EXAM: CHEST  2 VIEW  COMPARISON:  Feb 02, 2014.  FINDINGS: Stable cardiac silhouette. However, increased right hilar density is noted concerning for possible mass or adenopathy. Left lung is clear. No pneumothorax or significant pleural effusion is noted. Bony thorax is intact.  IMPRESSION: Increased right hilar density is noted concerning for neoplasm or adenopathy. CT scan of the chest with contrast is recommended for further evaluation   Electronically Signed   By: Roque Lias M.D.   On: 04/23/2014 08:47   Ct Angio Chest W/cm &/or Wo Cm  04/23/2014   CLINICAL DATA:  Shortness of breath for 4 days, nonproductive dry cough for 3 years, history pulmonary embolism, abnormal chest radiograph with RIGHT hilar/suprahilar enlargement  EXAM: CT ANGIOGRAPHY CHEST WITH CONTRAST  TECHNIQUE: Multidetector CT imaging of the chest was performed using the standard protocol during bolus administration of intravenous contrast. Multiplanar CT image reconstructions and MIPs were obtained to evaluate the vascular anatomy.  CONTRAST:  OMNIPAQUE IOHEXOL 350 MG/ML SOLN  COMPARISON:  CT chest 12/01/2013, chest radiograph 04/23/2014  FINDINGS: Mild scattered atherosclerotic calcification.  Aorta normal caliber without aneurysm or dissection.  Visualized upper abdomen unremarkable.  No thoracic adenopathy.  Scattered artifacts from respiratory motion and beam hardening secondary to body habitus.  Pulmonary arteries grossly patent.  No evidence of pulmonary embolism.  Dependent atelectasis in lower lobes.  Minimal peribronchial thickening.  Abnormality at the RIGHT hilum on chest radiography appear secondary to hilar and mediastinal vascular structures ; no pulmonary mass identified.  Minimal nonspecific ground-glass opacity peripherally in the RIGHT upper lobe.  No additional pulmonary infiltrate, pleural effusion,  pneumothorax, or acute osseous findings.  Multilevel degenerative disc disease changes thoracic spine.  Review of the MIP images confirms the above findings.  IMPRESSION: No evidence pulmonary embolism.  Chest radiographic abnormality is due to prominent hilar and mediastinal vascular structures.  Minimal nonspecific ground-glass opacity in the RIGHT upper lobe new since previous exam, with associated decrease in lung volumes, question related to minimal atelectasis or infiltrate.  Dependent atelectasis in lower lobes.   Electronically Signed   By: Ulyses Southward M.D.   On: 04/23/2014 09:49    Scheduled Meds: . atenolol  100 mg Oral Daily  . docusate sodium  100 mg Oral BID  . FLUoxetine  60 mg Oral Daily  . fluticasone  2 puff Inhalation Daily  . furosemide  20 mg Intravenous BID  . gabapentin  300 mg Oral TID  . hydrOXYzine  10 mg Oral QHS  . lisinopril  10 mg Oral Daily  . montelukast  10 mg Oral QHS  . pantoprazole  40 mg Oral Daily  . polyethylene glycol  17 g Oral Daily  . polyvinyl alcohol  2 drop Both Eyes BID  . potassium chloride  40 mEq Oral Daily  . senna  2 tablet Oral QHS  . sodium chloride  3 mL Intravenous Q12H  . sucralfate  1 g Oral TID AC & HS  . traMADol  50 mg Oral TID  . Warfarin - Pharmacist Dosing Inpatient   Does not apply q1800   Continuous Infusions:   Active Problems:   DEPRESSION   ASTHMA, PERSISTENT   Encounter for long-term (current) use of anticoagulants   Iron deficiency anemia, unspecified   Vertigo   MDD (major depressive disorder), single episode, severe   Hypoxia   Acute on chronic diastolic heart failure    Time spent: 30 min    Shaela Boer, [facility]  [facility] Pager [phone]. If 7PM-7AM, please contact night-coverage at [email or web address], password [facility] 04/24/2014, 1:42 PM  LOS: 1 day

## 2014-04-25 DIAGNOSIS — D6859 Other primary thrombophilia: Secondary | ICD-10-CM

## 2014-04-25 DIAGNOSIS — D649 Anemia, unspecified: Secondary | ICD-10-CM

## 2014-04-25 LAB — CBC
HCT: 34.9 % — ABNORMAL LOW (ref 36.0–46.0)
Hemoglobin: 11.5 g/dL — ABNORMAL LOW (ref 12.0–15.0)
MCH: 30.9 pg (ref 26.0–34.0)
MCHC: 33 g/dL (ref 30.0–36.0)
MCV: 93.8 fL (ref 78.0–100.0)
Platelets: 294 10*3/uL (ref 150–400)
RBC: 3.72 MIL/uL — AB (ref 3.87–5.11)
RDW: 15.3 % (ref 11.5–15.5)
WBC: 5.7 10*3/uL (ref 4.0–10.5)

## 2014-04-25 LAB — BASIC METABOLIC PANEL
ANION GAP: 10 (ref 5–15)
BUN: 6 mg/dL (ref 6–23)
CALCIUM: 10.1 mg/dL (ref 8.4–10.5)
CO2: 34 mEq/L — ABNORMAL HIGH (ref 19–32)
Chloride: 96 mEq/L (ref 96–112)
Creatinine, Ser: 1.21 mg/dL — ABNORMAL HIGH (ref 0.50–1.10)
GFR calc Af Amer: 51 mL/min — ABNORMAL LOW (ref >90)
GFR, EST NON AFRICAN AMERICAN: 44 mL/min — AB (ref >90)
Glucose, Bld: 124 mg/dL — ABNORMAL HIGH (ref 70–99)
Potassium: 3.9 mEq/L (ref 3.7–5.3)
SODIUM: 140 meq/L (ref 137–147)

## 2014-04-25 LAB — PROTIME-INR
INR: 2.8 — ABNORMAL HIGH (ref 0.00–1.49)
Prothrombin Time: 29.5 seconds — ABNORMAL HIGH (ref 11.6–15.2)

## 2014-04-25 LAB — PROCALCITONIN

## 2014-04-25 MED ORDER — OXYCODONE-ACETAMINOPHEN 5-325 MG PO TABS
1.0000 | ORAL_TABLET | Freq: Once | ORAL | Status: AC
  Administered 2014-04-25: 1 via ORAL
  Filled 2014-04-25: qty 1

## 2014-04-25 MED ORDER — FUROSEMIDE 20 MG PO TABS: 20.0000 mg | ORAL_TABLET | Freq: Every day | ORAL | Status: DC

## 2014-04-25 MED ORDER — WARFARIN SODIUM 2.5 MG PO TABS
2.5000 mg | ORAL_TABLET | Freq: Once | ORAL | Status: AC
  Administered 2014-04-25: 2.5 mg via ORAL
  Filled 2014-04-25: qty 1

## 2014-04-25 MED ORDER — TRAMADOL HCL 50 MG PO TABS
100.0000 mg | ORAL_TABLET | Freq: Once | ORAL | Status: AC
  Administered 2014-04-25: 100 mg via ORAL
  Filled 2014-04-25: qty 2

## 2014-04-25 NOTE — Progress Notes (Signed)
ANTICOAGULATION CONSULT NOTE - Follow-up Consult  Pharmacy Consult for warfarin Indication: Hx DVT/PE, FVL  Allergies  Allergen Reactions  . Other     Pt states she allergic to a lot of generic medicines.    Patient Measurements: Height: 5\' 6"  (167.6 cm) Weight: 247 lb 3.2 oz (112.129 kg) IBW/kg (Calculated) : 59.3  Vital Signs: Temp: 98.4 F (36.9 C) (08/02 0420) Temp src: Oral (08/02 0420) BP: 138/77 mmHg (08/02 0420) Pulse Rate: 58 (08/02 0420)  Labs:  Recent Labs  04/23/14 0754 04/24/14 0407 04/25/14 0500  HGB 10.1* 10.8* 11.5*  HCT 30.7* 33.4* 34.9*  PLT 241 284 294  LABPROT 25.8* 28.5* 29.5*  INR 2.36* 2.68* 2.80*  CREATININE 0.88 0.89 1.21*  TROPONINI <0.30  --   --     Estimated Creatinine Clearance: 54.1 ml/min (by C-G formula based on Cr of 1.21).   Medical History: Past Medical History  Diagnosis Date  . Depression   . Schizophrenia   . Hyperlipidemia   . Hypertension   . Chronic pain     "over my whole body" (03/30/2013)  . 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 DVT (deep venous thrombosis)     per 01/17/10 d/c summary- "remote hx of dvt"?  . Iron deficiency anemia   . Glaucoma   . Hemorrhoids   . Asthma   . Anxiety   . GERD (gastroesophageal reflux disease)   . Psoriasis 04/29/2012    New onset evaluated by Dr Marylou Flesher, Dermatology, [facility] 6/13  Rx 0.1% Tacrolimus ointment  . Complication of anesthesia     "because I have sleep apnea" (03/30/2013)  . Heart murmur   . Chest pain, exertional   . Chronic bronchitis   . Sleep apnea     "waiting on my CPAP" (03/30/2013)  . Pneumonia     "a few times" (03/30/2013)  . Exertional shortness of breath     "& sometimes when laying down" (03/30/2013)  . Daily headache     "last 2 months" (03/30/2013)  . Arthritis     "joints" (03/30/2013)  . Varicose veins of legs   . Psoriasis     Medications:  Scheduled:  . atenolol  100 mg Oral Daily  . docusate sodium  100 mg  Oral BID  . FLUoxetine  60 mg Oral Daily  . fluticasone  2 puff Inhalation Daily  . furosemide  40 mg Intravenous BID  . gabapentin  300 mg Oral TID  . hydrOXYzine  10 mg Oral QHS  . lisinopril  10 mg Oral Daily  . montelukast  10 mg Oral QHS  . pantoprazole  40 mg Oral Daily  . polyethylene glycol  17 g Oral Daily  . polyvinyl alcohol  2 drop Both Eyes BID  . potassium chloride  40 mEq Oral Daily  . senna  2 tablet Oral QHS  . sodium chloride  3 mL Intravenous Q12H  . sucralfate  1 g Oral TID AC & HS  . traMADol  50 mg Oral TID  . Warfarin - Pharmacist Dosing Inpatient   Does not apply q1800   Infusions:    Assessment: 73 yo female presented to ER with SOB, probably acute on chronic dHF (EF 55-60%). Patient with hx DVT/PE and factor V leiden reportedly on warfarin 5mg  daily PTA. Patient also with hx asthma, anxiety/depression, schizophrenia and GERD. Pharmacy consulted to resume warfarin dosing inpatient on 7/31.   8/2: INR remains therapeutic,  rising.  CBC stable, no bleeding reported.  No major DDIs noted.  75% meals documented eaten.  Bump in SCr.    Goal of Therapy:  INR 2-3   Plan:  1) Warfarin 2.5mg  po x 1 tonight (1/2 home dose) 2) Daily INR  Haynes Hoehnolleen Aseneth Hack, PharmD, BCPS 04/25/2014, 1:04 PM  Pager: [phone]

## 2014-04-25 NOTE — Progress Notes (Signed)
CARE MANAGEMENT NOTE 04/25/2014  Patient:  [name] [name],[name] [name]   Account Number:  [id number]  Date Initiated:  04/23/2014  Documentation initiated by:  Ezekiel InaMcGIBBONEY,[name]  Subjective/Objective Assessment:   pt admitted with cco SOB     Action/Plan:   from home   Anticipated DC Date:  04/26/2014   Anticipated DC Plan:  HOME/SELF CARE      DC Planning Services  CM consult      Choice offered to / List presented to:             Status of service:  Completed, signed off Medicare Important Message given?  NA - LOS <3 / Initial given by admissions (If response is "NO", the following Medicare IM given date fields will be blank) Date Medicare IM given:   Medicare IM given by:   Date Additional Medicare IM given:   Additional Medicare IM given by:    Discharge Disposition:  HOME/SELF CARE  Per UR Regulation:  Reviewed for med. necessity/level of care/duration of stay  If discussed at Long Length of Stay Meetings, dates discussed:    Comments:  04/25/2014 1500 NCM spoke to pt and states she lives with her husband. She has RW and cane at home. She is scheduled to go to outpt PT and will follow up to verify her appt on Monday. No NCM needs identified. Isidoro DonningAlesia Myles Tavella RN CCM Case Mgmt phone [phone]  04/23/14 MMcGibboney, RN, BSN Chart reviewed.

## 2014-04-25 NOTE — Progress Notes (Signed)
sats 96% ambulating on room air

## 2014-04-25 NOTE — Discharge Summary (Signed)
Physician Discharge Summary  [name] [name] UEA:[id number] DOB: Oct 15, 1940 DOA: 04/23/2014  PCP: [name], MD  Admit date: 04/23/2014 Discharge date: 04/25/2014  Recommendations for Outpatient Follow-up:  1. F/u with primary care doctor in 1 week for repeat BMP to check kidney function and ongoing education about heart failure.    Discharge Diagnoses:  Active Problems:   DEPRESSION   ASTHMA, PERSISTENT   Encounter for long-term (current) use of anticoagulants   Iron deficiency anemia, unspecified   Vertigo   MDD (major depressive disorder), single episode, severe   Hypoxia   Acute on chronic diastolic heart failure   Discharge Condition: stable, improved  Diet recommendation: healthy heart, 2 gm   Wt Readings from Last 3 Encounters:  04/25/14 112.129 kg (247 lb 3.2 oz)  04/05/14 111.131 kg (245 lb)  03/22/14 115.078 kg (253 lb 11.2 oz)    History of present illness:   73 year old female with history of DVT/PE on chronic anticoagulation, asthma/chronic bronchitis, anxiety/depression, schizophrenia, GERD he presented with shortness of breath. She was at her baseline health about 3 weeks ago, but she developed progressive shortness of breath with "bubbles" in her chest. She noticed increased lower extremity swelling. She had increased dyspnea on exertion with difficulty walking 20 feet without needing to stop to rest. She denies orthopnea and PND. She thinks she may have been urinating less than usual over the last week prior to admission.    Hospital Course:   Acute hypoxic respiratory failure secondary to acute on chronic diastolic heart failure. She presented with lower extremity edema and vascular congestion on chest x-ray. She did not have fevers or leukocytosis to suggest pneumonia and her procalcitonin was negative.  She had no primary on urinalysis and her albumin was only mildly low. Her echocardiogram from last year demonstrated preserved ejection fraction with grade 1  diastolic dysfunction and repeat echocardiogram from this hospitalization again demonstrated ejection fraction of 55-60% with no regional wall motion abnormalities, however she had suggestion of diastolic dysfunction and moderate LVH. Her PA pressure was 44 mmHg.  She was started on Lasix 40 mg IV twice a day and her weight decreased from 117 kg to 112 kg over the course of 3 days.  Her leg swelling and shortness of breath improved. She was able to ambulate and maintain oxygen saturations greater than 88% at the time of discharge. Her creatinine had increased from 0.8-1.2 by the time of discharge indicating that she had achieved near euvolemia.  Dry weight is approximately 112 kg. She was given education by nursing staff via videos and handouts. I also personally spent 30 minutes on 8/1 discussed seeing low-sodium diet and another 30 minutes on 8/2 doing teach back regarding daily weights and the importance of taking her Lasix daily. She demonstrates fair understanding however she has Kyreese Chio-term memory difficulties. I expect fair to poor compliance.  She was advised to take Lasix 20 mg once daily and followup with her primary care doctor in one week for repeat BMP. She should weigh herself daily and if she gains more than 3 pounds in one day or 5 pounds in one week she should call her primary care doctor. She should have her husband assists her with these tasks.  Asthma, stable, she continued Flovent, Singulair, and I received your labs while in the hospital. She did not receive steroid burst as her wheezing improved with diuresis.  History of pulmonary embolism and DVT on chronic anticoagulation. Her INR was therapeutic and the pharmacist dosed  her warfarin. She should continue her warfarin regimen.  Depression/anxiety/schizophrenia, stable, continued home medications.  Hypertension/hyperlipidemia, blood pressure stable. She continued atenolol and lisinopril. Her Lasix will replace her  hydrochlorothiazide.  Normocytic anemia, likely anemia of chronic disease, occult stool was negative. Her hemoglobin remained approximately stable.  Hypokalemia due to diuresis, resolved with oral potassium repletion.  Consultants:  None Procedures:  CXR  CT angio chest  ECHO Antibiotics:  none   Discharge Exam: Filed Vitals:   04/25/14 1438  BP: 141/74  Pulse: 63  Temp: 98.4 F (36.9 C)  Resp: 18   Filed Vitals:   04/25/14 0420 04/25/14 0837 04/25/14 1438 04/25/14 1530  BP: 138/77  141/74   Pulse: 58  63   Temp: 98.4 F (36.9 C)  98.4 F (36.9 C)   TempSrc: Oral  Oral   Resp: 19  18   Height:      Weight: 112.129 kg (247 lb 3.2 oz)     SpO2: 97% 93% 98% 96%    General: BF, No acute distress  HEENT: NCAT, MMM  Cardiovascular: RRR, nl S1, S2, no murmurs, 2+ pulses, warm extremities  Respiratory:  CTAB, no increased WOB  Abdomen: NABS, soft, NT/ND  MSK: Normal tone and bulk, 1+ left LEE (chronic from varicose veins), no RLE edema Neuro: Grossly intact   Discharge Instructions      Discharge Instructions   (HEART FAILURE PATIENTS) Call MD:  Anytime you have any of the following symptoms: 1) 3 pound weight gain in 24 hours or 5 pounds in 1 week 2) shortness of breath, with or without a dry hacking cough 3) swelling in the hands, feet or stomach 4) if you have to sleep on extra pillows at night in order to breathe.    Complete by:  As directed      Call MD for:  difficulty breathing, headache or visual disturbances    Complete by:  As directed      Call MD for:  extreme fatigue    Complete by:  As directed      Call MD for:  hives    Complete by:  As directed      Call MD for:  persistant dizziness or light-headedness    Complete by:  As directed      Call MD for:  persistant nausea and vomiting    Complete by:  As directed      Call MD for:  severe uncontrolled pain    Complete by:  As directed      Call MD for:  temperature >100.4    Complete by:  As  directed      Diet - low sodium heart healthy    Complete by:  As directed      Discharge instructions    Complete by:  As directed   You were hospitalized with heart failure.  There are 3 important things you need to do with heart failure to prevent your heart failure from getting worse.  Heart failure can cause shortness of breath and swelling of the legs.  #1.  Please try to eat less than 2000 mg of sodium each day.  #2.  Weigh yourself every day first thing in the morning and record the numbers.  If you gain more than 3-lbs in 1 day or 5-lbs in 1 week, call your doctor right away for instructions on what to do.  If you do not call, your heart failure will worsen and you will become  Esker Dever of breath.  #3.  Take lasix every day.   Please stop your HCTZ - it will be replaced by lasix.     Increase activity slowly    Complete by:  As directed             Medication List    STOP taking these medications       hydrochlorothiazide 25 MG tablet  Commonly known as:  HYDRODIURIL      TAKE these medications       albuterol 108 (90 BASE) MCG/ACT inhaler  Commonly known as:  PROVENTIL HFA;VENTOLIN HFA  Inhale 2 puffs into the lungs every 4 (four) hours as needed for wheezing or shortness of breath.     ARTIFICIAL TEARS 1.4 % ophthalmic solution  Generic drug:  polyvinyl alcohol  Place 2 drops into both eyes 2 (two) times daily.     atenolol 100 MG tablet  Commonly known as:  TENORMIN  Take 1 tablet (100 mg total) by mouth daily.     esomeprazole 20 MG capsule  Commonly known as:  NEXIUM  Take 20 mg by mouth daily. Pt uses Nexium 24hour OTC product.     FLUoxetine 20 MG capsule  Commonly known as:  PROZAC  Take 60 mg by mouth daily.     fluticasone 220 MCG/ACT inhaler  Commonly known as:  FLOVENT HFA  Inhale 2 puffs into the lungs daily.     furosemide 20 MG tablet  Commonly known as:  LASIX  Take 1 tablet (20 mg total) by mouth daily.     gabapentin 300 MG capsule  Commonly  known as:  NEURONTIN  Take 1 capsule (300 mg total) by mouth 3 (three) times daily.     hydrOXYzine 10 MG tablet  Commonly known as:  ATARAX/VISTARIL  Take 10 mg by mouth at bedtime.     lisinopril 10 MG tablet  Commonly known as:  PRINIVIL,ZESTRIL  Take 1 tablet (10 mg total) by mouth daily.     montelukast 10 MG tablet  Commonly known as:  SINGULAIR  Take 10 mg by mouth at bedtime.     PROTOPIC 0.1 % ointment  Generic drug:  tacrolimus  Apply 1 application topically 3 (three) times daily as needed (for legs). Apply to affected areas daily PRN     silver sulfADIAZINE 1 % cream  Commonly known as:  SILVADENE  Apply 1 application topically daily. Apply to affected areas for shingles     sucralfate 1 G tablet  Commonly known as:  CARAFATE  Take 1 g by mouth 4 (four) times daily.     triamcinolone cream 0.1 %  Commonly known as:  KENALOG  Apply 1 application topically 2 (two) times daily as needed (for legs).     TYLENOL 500 MG tablet  Generic drug:  acetaminophen  Take 500 mg by mouth every 6 (six) hours as needed for mild pain.     warfarin 5 MG tablet  Commonly known as:  COUMADIN  Take 1 tablet (5 mg total) by mouth daily.       Follow-up Information   Follow up with [name], MD. Schedule an appointment as soon as possible for a visit in 1 week.   Specialty:  Family Medicine   Contact information:   [address] [place] [address] [phone]        The results of significant diagnostics from this hospitalization (including imaging, microbiology, ancillary and laboratory) are listed below for  reference.    Significant Diagnostic Studies: Dg Chest 2 View  04/23/2014   CLINICAL DATA:  Shortness of breath.  EXAM: CHEST  2 VIEW  COMPARISON:  Feb 02, 2014.  FINDINGS: Stable cardiac silhouette. However, increased right hilar density is noted concerning for possible mass or adenopathy. Left lung is clear. No pneumothorax or significant pleural effusion is  noted. Bony thorax is intact.  IMPRESSION: Increased right hilar density is noted concerning for neoplasm or adenopathy. CT scan of the chest with contrast is recommended for further evaluation   Electronically Signed   By: Roque Lias M.D.   On: 04/23/2014 08:47   Ct Angio Chest W/cm &/or Wo Cm  04/23/2014   CLINICAL DATA:  Shortness of breath for 4 days, nonproductive dry cough for 3 years, history pulmonary embolism, abnormal chest radiograph with RIGHT hilar/suprahilar enlargement  EXAM: CT ANGIOGRAPHY CHEST WITH CONTRAST  TECHNIQUE: Multidetector CT imaging of the chest was performed using the standard protocol during bolus administration of intravenous contrast. Multiplanar CT image reconstructions and MIPs were obtained to evaluate the vascular anatomy.  CONTRAST:  OMNIPAQUE IOHEXOL 350 MG/ML SOLN  COMPARISON:  CT chest 12/01/2013, chest radiograph 04/23/2014  FINDINGS: Mild scattered atherosclerotic calcification.  Aorta normal caliber without aneurysm or dissection.  Visualized upper abdomen unremarkable.  No thoracic adenopathy.  Scattered artifacts from respiratory motion and beam hardening secondary to body habitus.  Pulmonary arteries grossly patent.  No evidence of pulmonary embolism.  Dependent atelectasis in lower lobes.  Minimal peribronchial thickening.  Abnormality at the RIGHT hilum on chest radiography appear secondary to hilar and mediastinal vascular structures ; no pulmonary mass identified.  Minimal nonspecific ground-glass opacity peripherally in the RIGHT upper lobe.  No additional pulmonary infiltrate, pleural effusion, pneumothorax, or acute osseous findings.  Multilevel degenerative disc disease changes thoracic spine.  Review of the MIP images confirms the above findings.  IMPRESSION: No evidence pulmonary embolism.  Chest radiographic abnormality is due to prominent hilar and mediastinal vascular structures.  Minimal nonspecific ground-glass opacity in the RIGHT upper lobe  new since previous exam, with associated decrease in lung volumes, question related to minimal atelectasis or infiltrate.  Dependent atelectasis in lower lobes.   Electronically Signed   By: Ulyses Southward M.D.   On: 04/23/2014 09:49    Microbiology: No results found for this or any previous visit (from the past 240 hour(s)).   Labs: Basic Metabolic Panel:  Recent Labs Lab 04/23/14 0754 04/24/14 0407 04/25/14 0500  NA 138 140 140  K 3.7 3.5* 3.9  CL 98 99 96  CO2 27 31 34*  GLUCOSE 124* 113* 124*  BUN 8 5* 6  CREATININE 0.88 0.89 1.21*  CALCIUM 9.0 9.4 10.1   Liver Function Tests:  Recent Labs Lab 04/23/14 0800  AST 43*  ALT 23  ALKPHOS 102  BILITOT <0.2*  PROT 7.5  ALBUMIN 3.2*   No results found for this basename: LIPASE, AMYLASE,  in the last 168 hours No results found for this basename: AMMONIA,  in the last 168 hours CBC:  Recent Labs Lab 04/23/14 0754 04/24/14 0407 04/25/14 0500  WBC 6.1 6.3 5.7  NEUTROABS 4.1  --   --   HGB 10.1* 10.8* 11.5*  HCT 30.7* 33.4* 34.9*  MCV 95.3 94.9 93.8  PLT 241 284 294   Cardiac Enzymes:  Recent Labs Lab 04/23/14 0754  TROPONINI <0.30   BNP: BNP (last 3 results)  Recent Labs  04/23/14 0754  PROBNP 707.5*  CBG: No results found for this basename: GLUCAP,  in the last 168 hours  Time coordinating discharge: 45 minutes  Signed:  Keyna Blizard  [facility] 04/25/2014, 3:41 PM

## 2014-04-28 ENCOUNTER — Other Ambulatory Visit: Payer: PRIVATE HEALTH INSURANCE

## 2014-04-28 ENCOUNTER — Ambulatory Visit: Payer: PRIVATE HEALTH INSURANCE

## 2014-04-29 ENCOUNTER — Telehealth: Payer: Self-pay | Admitting: Pharmacist

## 2014-04-29 NOTE — Telephone Encounter (Signed)
VM left by [name] [name] at [facility] regarding a new RX presented to them by patient yesterday. RX was for Coumadin 1 mg, take 7 tablets daily. [name] [name] questioned this and asked him to call the Coumadin Clinic. RX was from [name] [name], an ED  Physician at another health system. Coumadin was managed by [facility] during [name] [name]'s recent hospital admission. Notes from that admission have been reviewed. I called and spoke with [name] [name], she already has a Coumadin clinic visit scheduled for next week. She will also see [name] [name] next week. She will continue taking Coumadin 5 mg daily. I called [facility] pharmacy and gave them an RX for Coumadin 5 mg #45, refill x 6,  take as directed by Coumadin clinic.

## 2014-04-30 ENCOUNTER — Other Ambulatory Visit: Payer: Self-pay | Admitting: Oncology

## 2014-04-30 ENCOUNTER — Ambulatory Visit: Payer: PRIVATE HEALTH INSURANCE | Attending: Family Medicine

## 2014-04-30 DIAGNOSIS — Z7901 Long term (current) use of anticoagulants: Secondary | ICD-10-CM

## 2014-05-03 NOTE — Telephone Encounter (Signed)
Phone call - encounter closed. 

## 2014-05-04 ENCOUNTER — Encounter: Payer: Self-pay | Admitting: Oncology

## 2014-05-04 ENCOUNTER — Other Ambulatory Visit: Payer: PRIVATE HEALTH INSURANCE

## 2014-05-04 ENCOUNTER — Ambulatory Visit (INDEPENDENT_AMBULATORY_CARE_PROVIDER_SITE_OTHER): Payer: PRIVATE HEALTH INSURANCE | Admitting: Oncology

## 2014-05-04 VITALS — BP 156/89 | HR 84 | Temp 96.8°F | Resp 20 | Ht 64.0 in | Wt 249.6 lb

## 2014-05-04 DIAGNOSIS — I2699 Other pulmonary embolism without acute cor pulmonale: Secondary | ICD-10-CM

## 2014-05-04 DIAGNOSIS — Z7901 Long term (current) use of anticoagulants: Secondary | ICD-10-CM

## 2014-05-04 NOTE — Progress Notes (Signed)
Patient ID: [name] [name], female   DOB: [birth year], 73 y.o.   MRN: [id number] Hematology and Oncology Follow Up Visit  [name] [name] [id number] [birth year] 73 y.o. 05/04/2014 3:32 PM   Principle Diagnosis: Encounter Diagnoses  Name Primary?  . Chronic anticoagulation Yes  . PULMONARY EMBOLISM      Interim History:   Followup visit for this morbidly obese 73 year old woman with multiple medical problems. She was initially referred to me for advice on long-term anticoagulation. She sustained an unprovoked pulmonary embolism April 2011. She was found to be a heterozygote for factor V Leiden gene mutation. 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 She requested that we monitor her Coumadin through our office. She has been on 5 mg with low therapeutic INRs which have been stable since her first visit  in February of 2012.  She's had multiple hospital admissions over the last 2 years including  May 2012 admission for syncope;  a January 2014 admission for hemorrhage from a right leg varicose vein;  March, 2014 with hematochezia, dizziness and presyncope, March 30, 2014 with increasing lower extremity pain and dyspnea on exertion. Doppler studies negative for recurrent thrombosis. She was found to have a cellulitis and treated with parenteral antibiotics;  April 23, 2014 with increasing dyspnea at rest. CT angiogram of the chest was negative for pulmonary embolus. Previous CT suggested a small pulmonary nodule. No pulmonary masses seen on the current study. Previous echocardiograms are reported grade 1 diastolic dysfunction. Followup echocardiogram done during recent admission on 04/23/2014 showed moderate left ventricular hypertrophy, normal  systolic ejection fraction 55-60%, no significant diastolic dysfunction but a "increased relative contribution of atrial contraction to ventricular filling". Mild elevation of pulmonary artery pressure 44 mm. She continues to have mild dyspnea with exertion. She continues to have multifocal musculoskeletal, joint, and back pain.    Medications: reviewed  Allergies:  Allergies  Allergen Reactions  . Other     Pt states she allergic to a lot of generic medicines.    Review of Systems: Hematology:  No bleeding or bruising ENT ROS: No sore throat Breast ROS:  Respiratory ROS: Dyspnea with exertion Cardiovascular ROS:  No chest pain or palpitations Gastrointestinal ROS: No abdominal pain or change in bowel habit   Genito-Urinary ROS: Not questioned Musculoskeletal ROS: Chronic musculoskeletal pain Neurological ROS: Chronic paresthesias Dermatological ROS: No rash or ecchymosis Remaining ROS negative:   Physical Exam: Blood pressure 156/89, pulse 84, temperature 96.8 F (36 C), temperature source Oral, resp. rate 20, height 5\' 4"  (1.626 m), weight 249 lb 9.6 oz (113.218 kg), last menstrual period 11/22/1996, SpO2 98.00%. Wt Readings from Last 3 Encounters:  05/04/14 249 lb 9.6 oz (113.218 kg)  04/25/14 247 lb 3.2 oz (112.129 kg)  04/05/14 245 lb (111.131 kg)     General appearance: Pleasant, obese, African American woman HENNT: Pharynx no erythema, exudate, mass, or ulcer. No thyromegaly or thyroid nodules Lymph nodes: No cervical, supraclavicular, or axillary lymphadenopathy Breasts:  Lungs: Clear to auscultation, resonant to percussion throughout Heart: Regular rhythm, no murmur, no gallop, no rub, no click, no edema Abdomen: Soft, nontender, normal bowel sounds, no mass, no organomegaly Extremities: Asymmetric brawny edema left calf greater than right. Extensive superficial varicose veins both legs. Extensive venous stasis changes left greater than  right. Musculoskeletal: no  joint deformities GU:  Vascular: Carotid pulses 2+, no bruits,  Neurologic: Alert, oriented, PERRLA, cranial nerves grossly normal, motor strength 5 over 5, reflexes 1+ symmetric, upper body coordination normal, gait normal, Skin: No rash or ecchymosis  Lab Results: CBC W/Diff    Component Value Date/Time   WBC 5.7 04/25/2014 0500   WBC 5.6 12/01/2013 1056   RBC 3.72* 04/25/2014 0500   RBC 3.70 12/01/2013 1056   RBC 3.71* 03/17/2010 0645   HGB 11.5* 04/25/2014 0500   HGB 11.7 12/01/2013 1056   HCT 34.9* 04/25/2014 0500   HCT 35.1 12/01/2013 1056   PLT 294 04/25/2014 0500   PLT 241 12/01/2013 1056   MCV 93.8 04/25/2014 0500   MCV 94.7 12/01/2013 1056   MCH 30.9 04/25/2014 0500   MCH 31.5 12/01/2013 1056   MCHC 33.0 04/25/2014 0500   MCHC 33.3 12/01/2013 1056   RDW 15.3 04/25/2014 0500   RDW 14.4 12/01/2013 1056   LYMPHSABS 1.3 04/23/2014 0754   LYMPHSABS 1.1 12/01/2013 1056   MONOABS 0.4 04/23/2014 0754   MONOABS 0.3 12/01/2013 1056   EOSABS 0.3 04/23/2014 0754   EOSABS 0.2 12/01/2013 1056   BASOSABS 0.0 04/23/2014 0754   BASOSABS 0.0 12/01/2013 1056     Chemistry      Component Value Date/Time   NA 140 04/25/2014 0500   NA 138 12/01/2013 1056   K 3.9 04/25/2014 0500   K 3.9 12/01/2013 1056   CL 96 04/25/2014 0500   CO2 34* 04/25/2014 0500   CO2 28 12/01/2013 1056   BUN 6 04/25/2014 0500   BUN 15.4 12/01/2013 1056   CREATININE 1.21* 04/25/2014 0500   CREATININE 0.9 12/01/2013 1056   CREATININE 0.91 11/08/2010 1518      Component Value Date/Time   CALCIUM 10.1 04/25/2014 0500   CALCIUM 9.3 12/01/2013 1056   ALKPHOS 102 04/23/2014 0800   ALKPHOS 98 05/12/2013 1028   AST 43* 04/23/2014 0800   AST 21 05/12/2013 1028   ALT 23 04/23/2014 0800   ALT 21 05/12/2013 1028   BILITOT <0.2* 04/23/2014 0800   BILITOT 0.27 05/12/2013 1028    Protime/INR on 5 mg of Coumadin daily 29.5 seconds/2.8 on 04/25/2014   Radiological Studies: Dg Chest 2 View  04/23/2014   CLINICAL DATA:  Shortness of breath.  EXAM: CHEST  2 VIEW   COMPARISON:  Feb 02, 2014.  FINDINGS: Stable cardiac silhouette. However, increased right hilar density is noted concerning for possible mass or adenopathy. Left lung is clear. No pneumothorax or significant pleural effusion is noted. Bony thorax is intact.  IMPRESSION: Increased right hilar density is noted concerning for neoplasm or adenopathy. CT scan of the chest with contrast is recommended for further evaluation   Electronically Signed   By: Roque Lias M.D.   On: 04/23/2014 08:47   Ct Angio Chest W/cm &/or Wo Cm  04/23/2014   CLINICAL DATA:  Shortness of breath for 4 days, nonproductive dry cough for 3 years, history pulmonary embolism, abnormal chest radiograph with RIGHT hilar/suprahilar enlargement  EXAM: CT ANGIOGRAPHY CHEST WITH CONTRAST  TECHNIQUE: Multidetector CT imaging of the chest was performed using the standard protocol during bolus administration of intravenous contrast. Multiplanar CT image reconstructions and MIPs were obtained to evaluate the vascular anatomy.  CONTRAST:  OMNIPAQUE IOHEXOL 350 MG/ML SOLN  COMPARISON:  CT chest 12/01/2013, chest radiograph 04/23/2014  FINDINGS: Mild scattered atherosclerotic calcification.  Aorta normal caliber without aneurysm or dissection.  Visualized  upper abdomen unremarkable.  No thoracic adenopathy.  Scattered artifacts from respiratory motion and beam hardening secondary to body habitus.  Pulmonary arteries grossly patent.  No evidence of pulmonary embolism.  Dependent atelectasis in lower lobes.  Minimal peribronchial thickening.  Abnormality at the RIGHT hilum on chest radiography appear secondary to hilar and mediastinal vascular structures ; no pulmonary mass identified.  Minimal nonspecific ground-glass opacity peripherally in the RIGHT upper lobe.  No additional pulmonary infiltrate, pleural effusion, pneumothorax, or acute osseous findings.  Multilevel degenerative disc disease changes thoracic spine.  Review of the MIP images confirms  the above findings.  IMPRESSION: No evidence pulmonary embolism.  Chest radiographic abnormality is due to prominent hilar and mediastinal vascular structures.  Minimal nonspecific ground-glass opacity in the RIGHT upper lobe new since previous exam, with associated decrease in lung volumes, question related to minimal atelectasis or infiltrate.  Dependent atelectasis in lower lobes.   Electronically Signed   By: Ulyses Southward M.D.   On: 04/23/2014 09:49    Impression:  History of unprovoked pulmonary embolus in a lady with multiple ongoing risk factors for recurrent thrombosis.  She has been maintained on full dose Coumadin anticoagulation since initial event in 2011 and has had no subsequent events. In view of her ongoing risk for thrombosis, she will continue Coumadin indefinitely. She has established a good rapport with the clinical pharmacists who manage her Coumadin at the [facility] and,  given her rather volatile personality (schizophrenia/bipolar disorder), this good relationship with our pharmacists has definitely helped her to be compliant with the Coumadin. I am reluctant to put her on one of the new target specific oral anticoagulants at this time but would consider this in the future once we have reversal agents available.   CC: Patient Care Team: Tyrone Nine, MD as PCP - General (Family Medicine) Provider Not In System as Consulting Physician (Dermatology)   Levert Feinstein, MD 8/11/20153:32 PM

## 2014-05-04 NOTE — Patient Instructions (Signed)
Continue to [facility] at [facility] Follow up visit with [facility]anfortuna [facility] on February 16 at 10:15 AM

## 2014-05-05 ENCOUNTER — Telehealth: Payer: Self-pay | Admitting: *Deleted

## 2014-05-05 DIAGNOSIS — IMO0001 Reserved for inherently not codable concepts without codable children: Secondary | ICD-10-CM | POA: Insufficient documentation

## 2014-05-05 DIAGNOSIS — F3289 Other specified depressive episodes: Secondary | ICD-10-CM | POA: Diagnosis not present

## 2014-05-05 DIAGNOSIS — K219 Gastro-esophageal reflux disease without esophagitis: Secondary | ICD-10-CM | POA: Diagnosis not present

## 2014-05-05 DIAGNOSIS — F411 Generalized anxiety disorder: Secondary | ICD-10-CM | POA: Diagnosis not present

## 2014-05-05 DIAGNOSIS — Z7901 Long term (current) use of anticoagulants: Secondary | ICD-10-CM | POA: Insufficient documentation

## 2014-05-05 DIAGNOSIS — Z8639 Personal history of other endocrine, nutritional and metabolic disease: Secondary | ICD-10-CM | POA: Insufficient documentation

## 2014-05-05 DIAGNOSIS — Z79899 Other long term (current) drug therapy: Secondary | ICD-10-CM | POA: Insufficient documentation

## 2014-05-05 DIAGNOSIS — Z8701 Personal history of pneumonia (recurrent): Secondary | ICD-10-CM | POA: Insufficient documentation

## 2014-05-05 DIAGNOSIS — Z8659 Personal history of other mental and behavioral disorders: Secondary | ICD-10-CM | POA: Diagnosis not present

## 2014-05-05 DIAGNOSIS — G478 Other sleep disorders: Secondary | ICD-10-CM | POA: Diagnosis not present

## 2014-05-05 DIAGNOSIS — F329 Major depressive disorder, single episode, unspecified: Secondary | ICD-10-CM | POA: Diagnosis not present

## 2014-05-05 DIAGNOSIS — Z9981 Dependence on supplemental oxygen: Secondary | ICD-10-CM | POA: Diagnosis not present

## 2014-05-05 DIAGNOSIS — G8929 Other chronic pain: Secondary | ICD-10-CM | POA: Diagnosis not present

## 2014-05-05 DIAGNOSIS — Z86718 Personal history of other venous thrombosis and embolism: Secondary | ICD-10-CM | POA: Diagnosis not present

## 2014-05-05 DIAGNOSIS — Z872 Personal history of diseases of the skin and subcutaneous tissue: Secondary | ICD-10-CM | POA: Diagnosis not present

## 2014-05-05 DIAGNOSIS — R011 Cardiac murmur, unspecified: Secondary | ICD-10-CM | POA: Diagnosis not present

## 2014-05-05 DIAGNOSIS — I10 Essential (primary) hypertension: Secondary | ICD-10-CM | POA: Insufficient documentation

## 2014-05-05 DIAGNOSIS — Z760 Encounter for issue of repeat prescription: Secondary | ICD-10-CM | POA: Insufficient documentation

## 2014-05-05 DIAGNOSIS — J45909 Unspecified asthma, uncomplicated: Secondary | ICD-10-CM | POA: Insufficient documentation

## 2014-05-05 DIAGNOSIS — Z862 Personal history of diseases of the blood and blood-forming organs and certain disorders involving the immune mechanism: Secondary | ICD-10-CM | POA: Diagnosis not present

## 2014-05-05 DIAGNOSIS — Z8669 Personal history of other diseases of the nervous system and sense organs: Secondary | ICD-10-CM | POA: Insufficient documentation

## 2014-05-05 DIAGNOSIS — Z86711 Personal history of pulmonary embolism: Secondary | ICD-10-CM | POA: Diagnosis not present

## 2014-05-05 DIAGNOSIS — M129 Arthropathy, unspecified: Secondary | ICD-10-CM | POA: Diagnosis not present

## 2014-05-05 NOTE — Telephone Encounter (Signed)
Returned pt's call - pt's concerned about her sodium level being low. Told pt her Na+ level on 8/2 was 140 which is within normal range. States with her age and sweating, esp after being in the hospital - she felt her Na+ level might be low; she did not mention this to Dr Cyndie ChimeGranfortuna yesterday. She does have an appt Friday with Pulmonary per pt and will mention this; just wanted to let Dr Cyndie ChimeGranfortuna know about her concern today.

## 2014-05-06 ENCOUNTER — Telehealth: Payer: Self-pay | Admitting: Oncology

## 2014-05-06 ENCOUNTER — Emergency Department (HOSPITAL_COMMUNITY)
Admission: EM | Admit: 2014-05-06 | Discharge: 2014-05-06 | Disposition: A | Discharge status: Home / Self Care | Payer: PRIVATE HEALTH INSURANCE | Attending: Emergency Medicine | Admitting: Emergency Medicine

## 2014-05-06 ENCOUNTER — Encounter (HOSPITAL_COMMUNITY): Payer: Self-pay | Admitting: Emergency Medicine

## 2014-05-06 ENCOUNTER — Ambulatory Visit: Payer: PRIVATE HEALTH INSURANCE

## 2014-05-06 ENCOUNTER — Other Ambulatory Visit: Payer: PRIVATE HEALTH INSURANCE

## 2014-05-06 DIAGNOSIS — Z760 Encounter for issue of repeat prescription: Secondary | ICD-10-CM

## 2014-05-06 MED ORDER — FLUTICASONE PROPIONATE HFA 220 MCG/ACT IN AERO: 2.0000 | INHALATION_SPRAY | Freq: Two times a day (BID) | RESPIRATORY_TRACT | Status: DC

## 2014-05-06 MED ORDER — GABAPENTIN 300 MG PO CAPS
300.0000 mg | ORAL_CAPSULE | Freq: Once | ORAL | Status: AC
  Administered 2014-05-06: 300 mg via ORAL
  Filled 2014-05-06: qty 1

## 2014-05-06 MED ORDER — SUCRALFATE 1 G PO TABS: 1.0000 g | ORAL_TABLET | Freq: Four times a day (QID) | ORAL | Status: DC

## 2014-05-06 MED ORDER — TRAMADOL HCL 50 MG PO TABS
50.0000 mg | ORAL_TABLET | Freq: Once | ORAL | Status: AC
  Administered 2014-05-06: 50 mg via ORAL
  Filled 2014-05-06: qty 1

## 2014-05-06 MED ORDER — GABAPENTIN 300 MG PO CAPS: 300.0000 mg | ORAL_CAPSULE | Freq: Three times a day (TID) | ORAL | Status: DC

## 2014-05-06 MED ORDER — TRAMADOL HCL 50 MG PO TABS: 50.0000 mg | ORAL_TABLET | Freq: Four times a day (QID) | ORAL | Status: DC | PRN

## 2014-05-06 NOTE — ED Notes (Signed)
Pt reports she ran out of her gabapentin and tramadol, and is not able to see her doctor until Tuesday.  Pt reports leg pain.

## 2014-05-06 NOTE — Telephone Encounter (Signed)
per Thayer Ohmhris to sch lab & CC-pt aware

## 2014-05-06 NOTE — Discharge Instructions (Signed)
Medication Refill, Emergency Department °We have refilled your medication today as a courtesy to you. It is best for your medical care, however, to take care of getting refills done through your primary caregiver's office. They have your records and can do a better job of follow-up than we can in the emergency department. °On maintenance medications, we often only prescribe enough medications to get you by until you are able to see your regular caregiver. This is a more expensive way to refill medications. °In the future, please plan for refills so that you will not have to use the emergency department for this. °Thank you for your help. Your help allows us to better take care of the daily emergencies that enter our department. °Document Released: 12/28/2003 Document Revised: 12/03/2011 Document Reviewed: 12/18/2013 °Exit[employer]® Patient Information ©2015 [employer]. This information is not intended to replace advice given to you by your health care provider. Make sure you discuss any questions you have with your health care provider. ° °

## 2014-05-07 ENCOUNTER — Other Ambulatory Visit: Payer: PRIVATE HEALTH INSURANCE

## 2014-05-07 ENCOUNTER — Telehealth: Payer: Self-pay | Admitting: Pharmacist

## 2014-05-07 ENCOUNTER — Ambulatory Visit: Payer: PRIVATE HEALTH INSURANCE

## 2014-05-07 NOTE — Telephone Encounter (Signed)
Patient [facility]KA today for lab and coumadin clinic appointment. I called patient to reschedule. Patient stated that she forgot and that she would call back to reschedule when she knew her schedule for the next week or so.  Thank you, Christell Faithhris Fawnda Vitullo, PharmD, BCOP

## 2014-05-09 ENCOUNTER — Encounter (HOSPITAL_COMMUNITY): Payer: Self-pay | Admitting: Emergency Medicine

## 2014-05-09 ENCOUNTER — Emergency Department (INDEPENDENT_AMBULATORY_CARE_PROVIDER_SITE_OTHER)
Admission: EM | Admit: 2014-05-09 | Discharge: 2014-05-09 | Disposition: A | Discharge status: Home / Self Care | Payer: PRIVATE HEALTH INSURANCE | Source: Home / Self Care | Attending: Emergency Medicine | Admitting: Emergency Medicine

## 2014-05-09 DIAGNOSIS — G629 Polyneuropathy, unspecified: Secondary | ICD-10-CM

## 2014-05-09 DIAGNOSIS — G589 Mononeuropathy, unspecified: Secondary | ICD-10-CM

## 2014-05-09 MED ORDER — TRAMADOL HCL 50 MG PO TABS: 100.0000 mg | ORAL_TABLET | Freq: Three times a day (TID) | ORAL | Status: DC | PRN

## 2014-05-09 MED ORDER — SILVER SULFADIAZINE 1 % EX CREA: 1.0000 "application " | TOPICAL_CREAM | Freq: Every day | CUTANEOUS | Status: DC

## 2014-05-09 MED ORDER — GABAPENTIN 300 MG PO CAPS: 300.0000 mg | ORAL_CAPSULE | Freq: Three times a day (TID) | ORAL | Status: DC

## 2014-05-09 MED ORDER — HYDROXYZINE HCL 10 MG PO TABS: 10.0000 mg | ORAL_TABLET | Freq: Three times a day (TID) | ORAL | Status: DC | PRN

## 2014-05-09 NOTE — ED Notes (Signed)
C/o she is out of her pain medication and unable to see her MD until Tuesday. Was seen [facility] - ED 8-13 for same

## 2014-05-09 NOTE — ED Provider Notes (Signed)
Chief Complaint   Chief Complaint  Patient presents with  . Leg Pain    History of Present Illness   [name] [name] is a 73 year old female who was just discharged from the hospital 2 weeks ago with congestive heart failure. She needs refills on some of her medications that she did not have enough of. She denies any shortness of breath, PND, orthopnea, chest pain, or ankle edema. She also has peripheral neuropathy. She needs refills on gabapentin 300 mg 3 times a day, tramadol, Silvadene, and hydroxyzine. She's had a long-standing history of lower leg pain, edema, varicose veins, and stasis changes.  Review of Systems     Other than as noted above, the patient denies any of the following symptoms: Systemic:  No fever, chills, fatigue, myalgias, headache, or anorexia. Eye:  No redness, pain or drainage. ENT:  No earache, nasal congestion, rhinorrhea, sinus pressure, or sore throat. Lungs:  No cough, sputum production, wheezing, shortness of breath.  Cardiovascular:  No chest pain, palpitations, or syncope. GI:  No nausea, vomiting, abdominal pain or diarrhea. GU:  No dysuria, frequency, or hematuria. Skin:  No rash or pruritis.   PMFSH     Past medical history, family history, social history, meds, and allergies were reviewed.  Current meds include albuterol, atenolol, Nexium, Prozac, Flovent, furosemide, gabapentin, hydroxyzine, Singulair, Silvadene, Protopic, tramadol, triamcinolone, and warfarin. Medical problems include depression, schizophrenia, hyperlipidemia, hypertension, chronic pain, pulmonary embolism, DVT, iron deficiency anemia, glaucoma, hemorrhoids, asthma, anxiety, GERD, psoriasis, sleep apnea, and varicose veins.  Physical Examination    Vital signs:  BP 174/85  Pulse 75  Temp(Src) 98.4 F (36.9 C) (Oral)  SpO2 100%  LMP 11/22/1996 General:  Alert, in no distress. Eye:  PERRL, full EOMs.  Lids and conjunctivas were normal. ENT:  TMs and canals were normal,  without erythema or inflammation.  Nasal mucosa was clear and uncongested, without drainage.  Mucous membranes were moist.  Pharynx was clear, without exudate or drainage.  There were no oral ulcerations or lesions. Neck:  Supple, no adenopathy, tenderness or mass. Thyroid was normal. Lungs:  No respiratory distress.  Lungs were clear to auscultation, without wheezes, rales or rhonchi.  Breath sounds were clear and equal bilaterally. Heart:  Regular rhythm, without gallops, murmers or rubs. Abdomen:  Soft, flat, and non-tender to palpation.  No hepatosplenomagaly or mass. Extremities: There is no pitting edema. She does have some puffiness of her feet, ankles, and lower legs. She has severe varicose veins and stasis changes. Skin:  Clear, warm, and dry, without rash or lesions.   Plan     1.  Meds:  The following meds were prescribed:   Discharge Medication List as of 05/09/2014  2:44 PM    START taking these medications   Details  !! gabapentin (NEURONTIN) 300 MG capsule Take 1 capsule (300 mg total) by mouth 3 (three) times daily., Starting 05/09/2014, Until Discontinued, Normal    !! hydrOXYzine (ATARAX/VISTARIL) 10 MG tablet Take 1 tablet (10 mg total) by mouth 3 (three) times daily as needed., Starting 05/09/2014, Until Discontinued, Normal    !! silver sulfADIAZINE (SILVADENE) 1 % cream Apply 1 application topically daily., Starting 05/09/2014, Until Discontinued, Normal    !! traMADol (ULTRAM) 50 MG tablet Take 2 tablets (100 mg total) by mouth every 8 (eight) hours as needed., Starting 05/09/2014, Until Discontinued, Print     !! - Potential duplicate medications found. Please discuss with provider.      2.  Patient  Education/Counseling:  The patient was given appropriate handouts, self care instructions, and instructed in symptomatic relief.    3.  Follow up:  The patient was told to follow up here if no better in 3 to 4 days, or sooner if becoming worse in any way, and given some  red flag symptoms such as shortness of breath or chest pain which would prompt immediate return.  Follow up with her primary care Dr. next week.        Reuben Likesavid C Carollyn Etcheverry, MD 05/09/14 1540

## 2014-05-09 NOTE — Discharge Instructions (Signed)

## 2014-05-11 ENCOUNTER — Encounter: Payer: Self-pay | Admitting: Family Medicine

## 2014-05-11 ENCOUNTER — Ambulatory Visit (INDEPENDENT_AMBULATORY_CARE_PROVIDER_SITE_OTHER): Payer: PRIVATE HEALTH INSURANCE | Admitting: Family Medicine

## 2014-05-11 VITALS — BP 148/71 | HR 63 | Temp 99.5°F | Ht 65.0 in | Wt 245.0 lb

## 2014-05-11 DIAGNOSIS — I87329 Chronic venous hypertension (idiopathic) with inflammation of unspecified lower extremity: Secondary | ICD-10-CM

## 2014-05-11 DIAGNOSIS — M5442 Lumbago with sciatica, left side: Secondary | ICD-10-CM

## 2014-05-11 DIAGNOSIS — I8310 Varicose veins of unspecified lower extremity with inflammation: Secondary | ICD-10-CM

## 2014-05-11 DIAGNOSIS — M5430 Sciatica, unspecified side: Secondary | ICD-10-CM

## 2014-05-11 DIAGNOSIS — M5441 Lumbago with sciatica, right side: Secondary | ICD-10-CM

## 2014-05-11 DIAGNOSIS — I83813 Varicose veins of bilateral lower extremities with pain: Secondary | ICD-10-CM

## 2014-05-11 DIAGNOSIS — J45909 Unspecified asthma, uncomplicated: Secondary | ICD-10-CM

## 2014-05-11 DIAGNOSIS — I10 Essential (primary) hypertension: Secondary | ICD-10-CM

## 2014-05-11 DIAGNOSIS — I83893 Varicose veins of bilateral lower extremities with other complications: Secondary | ICD-10-CM

## 2014-05-11 DIAGNOSIS — K219 Gastro-esophageal reflux disease without esophagitis: Secondary | ICD-10-CM

## 2014-05-11 MED ORDER — GABAPENTIN 300 MG PO CAPS: 300.0000 mg | ORAL_CAPSULE | Freq: Three times a day (TID) | ORAL | Status: DC

## 2014-05-11 MED ORDER — TRAMADOL HCL 50 MG PO TABS: 50.0000 mg | ORAL_TABLET | Freq: Four times a day (QID) | ORAL | Status: DC | PRN

## 2014-05-11 MED ORDER — ALBUTEROL SULFATE HFA 108 (90 BASE) MCG/ACT IN AERS: 2.0000 | INHALATION_SPRAY | Freq: Two times a day (BID) | RESPIRATORY_TRACT | Status: DC | PRN

## 2014-05-11 MED ORDER — MONTELUKAST SODIUM 10 MG PO TABS: 10.0000 mg | ORAL_TABLET | Freq: Every day | ORAL | Status: DC

## 2014-05-11 MED ORDER — SUCRALFATE 1 G PO TABS: 1.0000 g | ORAL_TABLET | Freq: Four times a day (QID) | ORAL | Status: DC

## 2014-05-11 MED ORDER — SILVER SULFADIAZINE 1 % EX CREA: 1.0000 "application " | TOPICAL_CREAM | Freq: Every day | CUTANEOUS | Status: DC

## 2014-05-11 NOTE — Assessment & Plan Note (Signed)
Chronic, stable. Increasing gabapentin to 600mg  TID. Refilling tramadol 50mg  po q6h prn (pt takes QID) #120. Pain contract signed today prohibiting other providers from prescribing opioids. Reviewed in depth with patient the need to reliably follow up with me (2 recent no shows and 11 recent ED/UC visits) and not go to ED. Previously dismissed from multiple pain clinics.

## 2014-05-11 NOTE — Patient Instructions (Signed)
Thank you for coming in today!  As you leave, make an appointment to follow up with me in 1 month to return for refills on your tramadol.  - Bring all medications in a bag to your visits. - I am calling the pharmacy and we will see what we can get for you. - I will get our social worker to try to get Pam back out to your house if that will help you stay out of the emergency room.  - Please consider getting a colonoscopy, mammogram, and bone scan.   Take care and seek immediate care sooner if you develop any concerns.  Please feel free to call with any questions or concerns at any time, at [phone]. - Dr. Jarvis NewcomerGrunz

## 2014-05-13 ENCOUNTER — Telehealth: Payer: Self-pay | Admitting: Family Medicine

## 2014-05-13 NOTE — Telephone Encounter (Signed)
Pt needs phone number for [facility]

## 2014-05-13 NOTE — Telephone Encounter (Signed)
Pt called back---number given to her

## 2014-05-13 NOTE — Telephone Encounter (Signed)
Pt can get this filled on Sunday, per my conversation with the pharmacy tech while pt was in the office yesterday. This was communicated to the patient with exceeding clarity at that time. Please call to remind her of this and of the fact that this is an insurance issue over which we have no control.

## 2014-05-13 NOTE — Telephone Encounter (Signed)
Pt called and said that the pharmacy will not fill her Tramadol. They are telling her it is to early to be filled. She said the Dr. Jarvis NewcomerGrunz called and told them to fill this but they said that he didn't call the. Can someone please call and get this figured out for pt. jlw

## 2014-05-14 NOTE — Telephone Encounter (Signed)
Relayed message,patient voiced understanding. [name] [name] S  

## 2014-05-20 NOTE — ED Provider Notes (Signed)
CSN: [id number]     Arrival date & time 05/05/14  2336 History   First MD Initiated Contact with Patient 05/06/14 0236     Chief Complaint  Patient presents with  . Medication Refill     (Consider location/radiation/quality/duration/timing/severity/associated sxs/prior Treatment) HPI Comments: Late Entry, encounter was 05/06/14.  Pt with several comorbidities comes in for med refill. Pt ran out of her neurontin and tramadol. She is going to see her physician soon. Pain is chronic, unchanged.  The history is provided by the patient and medical records.    Past Medical History  Diagnosis Date  . Depression   . Schizophrenia   . Hyperlipidemia   . Hypertension   . Chronic pain     "over my whole body" (03/30/2013)  . 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 DVT (deep venous thrombosis)     per 01/17/10 d/c summary- "remote hx of dvt"?  . Iron deficiency anemia   . Glaucoma   . Hemorrhoids   . Asthma   . Anxiety   . GERD (gastroesophageal reflux disease)   . Psoriasis 04/29/2012    New onset evaluated by Dr Marylou Flesher, Dermatology, [facility] 6/13  Rx 0.1% Tacrolimus ointment  . Complication of anesthesia     "because I have sleep apnea" (03/30/2013)  . Heart murmur   . Chest pain, exertional   . Chronic bronchitis   . Sleep apnea     "waiting on my CPAP" (03/30/2013)  . Pneumonia     "a few times" (03/30/2013)  . Exertional shortness of breath     "& sometimes when laying down" (03/30/2013)  . Daily headache     "last 2 months" (03/30/2013)  . Arthritis     "joints" (03/30/2013)  . Varicose veins of legs   . Psoriasis    Past Surgical History  Procedure Laterality Date  . Spinal fusion      2004  . Carpal tunnel release Bilateral   . Tubal ligation  1973  . Dilation and curettage of uterus  1970's    "once" (03/30/2013)  . 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 Joint replacement    . Vein surgery  left leg   Family History  Problem Relation Age  of Onset  . Diabetes Sister   . Colon cancer Brother 40  . Colon cancer Brother    History  Substance Use Topics  . Smoking status: Never Smoker   . Smokeless tobacco: Never Used  . Alcohol Use: No   OB History   Grav Para Term Preterm Abortions TAB SAB Ect Mult Living                 Review of Systems  Constitutional: Negative for fever.  Cardiovascular: Negative for chest pain.  Gastrointestinal: Negative for abdominal pain.  Musculoskeletal: Positive for arthralgias and myalgias.      Allergies  Other  Home Medications   Prior to Admission medications   Medication Sig Start Date End Date Taking? Authorizing Provider  acetaminophen (TYLENOL) 500 MG tablet Take 500 mg by mouth every 6 (six) hours as needed for mild pain.   Yes Historical Provider, MD  atenolol (TENORMIN) 100 MG tablet Take 100 mg by mouth daily.   Yes Historical Provider, MD  esomeprazole (NEXIUM) 20 MG capsule Take 20 mg by mouth daily. Pt uses Nexium 24hour OTC product.   Yes Historical Provider, MD  FLUoxetine (PROZAC) 20 MG capsule Take 60  mg by mouth daily.   Yes Historical Provider, MD  furosemide (LASIX) 20 MG tablet Take 1 tablet (20 mg total) by mouth daily. 04/25/14  Yes Renae Fickle, MD  polyvinyl alcohol (ARTIFICIAL TEARS) 1.4 % ophthalmic solution Place 2 drops into both eyes 2 (two) times daily.   Yes Historical Provider, MD  tacrolimus (PROTOPIC) 0.1 % ointment Apply 1 application topically 3 (three) times daily as needed (for legs). Apply to affected areas daily PRN 03/01/14 03/01/15 Yes Historical Provider, MD  triamcinolone cream (KENALOG) 0.1 % Apply 1 application topically 2 (two) times daily as needed (for legs).    Yes Historical Provider, MD  warfarin (COUMADIN) 5 MG tablet Take 1 tablet (5 mg total) by mouth daily. 01/28/14  Yes Levert Feinstein, MD  albuterol (PROVENTIL HFA;VENTOLIN HFA) 108 (90 BASE) MCG/ACT inhaler Inhale 2 puffs into the lungs 2 (two) times daily as needed for  wheezing or shortness of breath. 05/11/14   Tyrone Nine, MD  fluticasone (FLOVENT HFA) 220 MCG/ACT inhaler Inhale 2 puffs into the lungs 2 (two) times daily. 05/06/14   Antony Madura, PA-C  gabapentin (NEURONTIN) 300 MG capsule Take 1 capsule (300 mg total) by mouth 3 (three) times daily. 05/11/14   Tyrone Nine, MD  montelukast (SINGULAIR) 10 MG tablet Take 1 tablet (10 mg total) by mouth at bedtime. 05/11/14   Tyrone Nine, MD  silver sulfADIAZINE (SILVADENE) 1 % cream Apply 1 application topically daily. 05/11/14   Tyrone Nine, MD  sucralfate (CARAFATE) 1 G tablet Take 1 tablet (1 g total) by mouth 4 (four) times daily. 05/11/14   Tyrone Nine, MD  traMADol (ULTRAM) 50 MG tablet Take 1 tablet (50 mg total) by mouth every 6 (six) hours as needed. 05/11/14   Tyrone Nine, MD   BP 187/92  Pulse 91  Temp(Src) 98.2 F (36.8 C) (Oral)  Resp 18  SpO2 97%  LMP 11/22/1996 Physical Exam  Nursing note and vitals reviewed. Constitutional: She is oriented to person, place, and time. She appears well-developed.  HENT:  Head: Atraumatic.  Neck: Neck supple.  Pulmonary/Chest: Effort normal.  Neurological: She is alert and oriented to person, place, and time.    ED Course  Procedures (including critical care time) Labs Review Labs Reviewed - No data to display  Imaging Review No results found.   EKG Interpretation None      MDM   Final diagnoses:  Encounter for medication refill   Med refill provided. No acute concerns from the patient. Will discharge.  Derwood Kaplan, MD 05/20/14 1253

## 2014-05-20 NOTE — Telephone Encounter (Signed)
Encounter was telephone call. 

## 2014-05-21 ENCOUNTER — Other Ambulatory Visit: Payer: Self-pay | Admitting: *Deleted

## 2014-05-23 ENCOUNTER — Encounter (HOSPITAL_COMMUNITY): Payer: Self-pay | Admitting: Emergency Medicine

## 2014-05-23 ENCOUNTER — Emergency Department (HOSPITAL_COMMUNITY)
Admission: EM | Admit: 2014-05-23 | Discharge: 2014-05-23 | Disposition: A | Discharge status: Home / Self Care | Payer: PRIVATE HEALTH INSURANCE | Attending: Emergency Medicine | Admitting: Emergency Medicine

## 2014-05-23 ENCOUNTER — Emergency Department (HOSPITAL_COMMUNITY): Payer: PRIVATE HEALTH INSURANCE

## 2014-05-23 DIAGNOSIS — Z86718 Personal history of other venous thrombosis and embolism: Secondary | ICD-10-CM | POA: Insufficient documentation

## 2014-05-23 DIAGNOSIS — H409 Unspecified glaucoma: Secondary | ICD-10-CM | POA: Diagnosis not present

## 2014-05-23 DIAGNOSIS — J45909 Unspecified asthma, uncomplicated: Secondary | ICD-10-CM | POA: Diagnosis not present

## 2014-05-23 DIAGNOSIS — G609 Hereditary and idiopathic neuropathy, unspecified: Secondary | ICD-10-CM | POA: Diagnosis not present

## 2014-05-23 DIAGNOSIS — Z862 Personal history of diseases of the blood and blood-forming organs and certain disorders involving the immune mechanism: Secondary | ICD-10-CM | POA: Diagnosis not present

## 2014-05-23 DIAGNOSIS — M129 Arthropathy, unspecified: Secondary | ICD-10-CM | POA: Insufficient documentation

## 2014-05-23 DIAGNOSIS — Z8659 Personal history of other mental and behavioral disorders: Secondary | ICD-10-CM | POA: Diagnosis not present

## 2014-05-23 DIAGNOSIS — Z7901 Long term (current) use of anticoagulants: Secondary | ICD-10-CM | POA: Insufficient documentation

## 2014-05-23 DIAGNOSIS — F411 Generalized anxiety disorder: Secondary | ICD-10-CM | POA: Diagnosis not present

## 2014-05-23 DIAGNOSIS — Z8701 Personal history of pneumonia (recurrent): Secondary | ICD-10-CM | POA: Insufficient documentation

## 2014-05-23 DIAGNOSIS — Z9981 Dependence on supplemental oxygen: Secondary | ICD-10-CM | POA: Diagnosis not present

## 2014-05-23 DIAGNOSIS — G4730 Sleep apnea, unspecified: Secondary | ICD-10-CM | POA: Insufficient documentation

## 2014-05-23 DIAGNOSIS — IMO0002 Reserved for concepts with insufficient information to code with codable children: Secondary | ICD-10-CM | POA: Diagnosis present

## 2014-05-23 DIAGNOSIS — F329 Major depressive disorder, single episode, unspecified: Secondary | ICD-10-CM | POA: Insufficient documentation

## 2014-05-23 DIAGNOSIS — K219 Gastro-esophageal reflux disease without esophagitis: Secondary | ICD-10-CM | POA: Diagnosis not present

## 2014-05-23 DIAGNOSIS — R209 Unspecified disturbances of skin sensation: Secondary | ICD-10-CM | POA: Diagnosis not present

## 2014-05-23 DIAGNOSIS — Y9389 Activity, other specified: Secondary | ICD-10-CM | POA: Diagnosis not present

## 2014-05-23 DIAGNOSIS — I10 Essential (primary) hypertension: Secondary | ICD-10-CM | POA: Diagnosis not present

## 2014-05-23 DIAGNOSIS — Z79899 Other long term (current) drug therapy: Secondary | ICD-10-CM | POA: Insufficient documentation

## 2014-05-23 DIAGNOSIS — F3289 Other specified depressive episodes: Secondary | ICD-10-CM | POA: Insufficient documentation

## 2014-05-23 DIAGNOSIS — R011 Cardiac murmur, unspecified: Secondary | ICD-10-CM | POA: Diagnosis not present

## 2014-05-23 DIAGNOSIS — S20219A Contusion of unspecified front wall of thorax, initial encounter: Secondary | ICD-10-CM | POA: Diagnosis not present

## 2014-05-23 DIAGNOSIS — Z86711 Personal history of pulmonary embolism: Secondary | ICD-10-CM | POA: Insufficient documentation

## 2014-05-23 DIAGNOSIS — L408 Other psoriasis: Secondary | ICD-10-CM | POA: Insufficient documentation

## 2014-05-23 DIAGNOSIS — G8929 Other chronic pain: Secondary | ICD-10-CM | POA: Diagnosis not present

## 2014-05-23 DIAGNOSIS — T148XXA Other injury of unspecified body region, initial encounter: Secondary | ICD-10-CM

## 2014-05-23 DIAGNOSIS — R296 Repeated falls: Secondary | ICD-10-CM | POA: Diagnosis not present

## 2014-05-23 DIAGNOSIS — Y929 Unspecified place or not applicable: Secondary | ICD-10-CM | POA: Diagnosis not present

## 2014-05-23 DIAGNOSIS — Z8639 Personal history of other endocrine, nutritional and metabolic disease: Secondary | ICD-10-CM | POA: Insufficient documentation

## 2014-05-23 LAB — I-STAT CHEM 8, ED
BUN: 7 mg/dL (ref 6–23)
CALCIUM ION: 1.15 mmol/L (ref 1.13–1.30)
CHLORIDE: 103 meq/L (ref 96–112)
Creatinine, Ser: 1 mg/dL (ref 0.50–1.10)
Glucose, Bld: 88 mg/dL (ref 70–99)
HEMATOCRIT: 36 % (ref 36.0–46.0)
Hemoglobin: 12.2 g/dL (ref 12.0–15.0)
POTASSIUM: 3.8 meq/L (ref 3.7–5.3)
Sodium: 139 mEq/L (ref 137–147)
TCO2: 29 mmol/L (ref 0–100)

## 2014-05-23 MED ORDER — HYDROCODONE-ACETAMINOPHEN 5-325 MG PO TABS: 1.0000 | ORAL_TABLET | Freq: Four times a day (QID) | ORAL | Status: DC | PRN

## 2014-05-23 MED ORDER — HYDROCODONE-ACETAMINOPHEN 5-325 MG PO TABS
2.0000 | ORAL_TABLET | Freq: Once | ORAL | Status: AC
  Administered 2014-05-23: 2 via ORAL
  Filled 2014-05-23: qty 2

## 2014-05-23 MED ORDER — FLUOXETINE HCL 20 MG PO CAPS
60.0000 mg | ORAL_CAPSULE | Freq: Every day | ORAL | Status: DC
Start: 2014-05-23 — End: 2014-05-25

## 2014-05-23 NOTE — Discharge Instructions (Signed)
Follow up with primary care physician. Take hydrocodone one to 2 pills every 4-6 hours as needed for pain.  Contusion A contusion is a deep bruise. Contusions are the result of an injury that caused bleeding under the skin. The contusion may turn blue, purple, or yellow. Minor injuries will give you a painless contusion, but more severe contusions may stay painful and swollen for a few weeks.  CAUSES  A contusion is usually caused by a blow, trauma, or direct force to an area of the body. SYMPTOMS   Swelling and redness of the injured area.  Bruising of the injured area.  Tenderness and soreness of the injured area.  Pain. DIAGNOSIS  The diagnosis can be made by taking a history and physical exam. An X-ray, CT scan, or MRI may be needed to determine if there were any associated injuries, such as fractures. TREATMENT  Specific treatment will depend on what area of the body was injured. In general, the best treatment for a contusion is resting, icing, elevating, and applying cold compresses to the injured area. Over-the-counter medicines may also be recommended for pain control. Ask your caregiver what the best treatment is for your contusion. HOME CARE INSTRUCTIONS   Put ice on the injured area.  Put ice in a plastic bag.  Place a towel between your skin and the bag.  Leave the ice on for 15-20 minutes, 3-4 times a day, or as directed by your health care provider.  Only take over-the-counter or prescription medicines for pain, discomfort, or fever as directed by your caregiver. Your caregiver may recommend avoiding anti-inflammatory medicines (aspirin, ibuprofen, and naproxen) for 48 hours because these medicines may increase bruising.  Rest the injured area.  If possible, elevate the injured area to reduce swelling. SEEK IMMEDIATE MEDICAL CARE IF:   You have increased bruising or swelling.  You have pain that is getting worse.  Your swelling or pain is not relieved with  medicines. MAKE SURE YOU:   Understand these instructions.  Will watch your condition.  Will get help right away if you are not doing well or get worse. Document Released: 06/20/2005 Document Revised: 09/15/2013 Document Reviewed: 07/16/2011 [facility] Patient Information 2015 [place], [place]. This information is not intended to replace advice given to you by your health care provider. Make sure you discuss any questions you have with your health care provider.

## 2014-05-23 NOTE — ED Provider Notes (Signed)
Medical screening examination/treatment/procedure(s) were conducted as a shared visit with non-physician practitioner(s) and myself.  I personally evaluated the patient during the encounter.   EKG Interpretation None     Patient here after sustaining a fall 6 days ago. She fell at home after having a syncopal event. States she's had multiple syncopal event in the past and this is no different. Denies any headache, from that event. On exam she is tender at the right posterior 10th or 11th rib. No crepitus noted. Will obtain x-rays. We'll also check patient's [name], MD 05/23/14 [id number]

## 2014-05-23 NOTE — ED Notes (Signed)
Bed: WA03 Expected date:  Expected time:  Means of arrival:  Comments: EMS 

## 2014-05-23 NOTE — ED Provider Notes (Signed)
CSN: [id number]     Arrival date & time 05/23/14  1256 History   First MD Initiated Contact with Patient 05/23/14 1530     Chief Complaint  Patient presents with  . Back Pain  . Fall     (Consider location/radiation/quality/duration/timing/severity/associated sxs/prior Treatment) HPI 73 year old female past medical history of chronic back pain, peripheral neuropathy, syncopal episodes, factor V Leiden on Coumadin presenting to the ER today after falling 7 days ago. Patient states she did have syncopal episode last Monday and does not remember hitting anything. Today patient complains of right-sided back pain which she reports as being different from her chronic pain. Patient states the pain began acutely after she fell has gradually become worse, is aggravated by movement, and pressure, and is not alleviated by any movement, position. She states she has not tried taking any medication for her pain. She denies any associated numbness, weakness, shortness of breath.   Past Medical History  Diagnosis Date  . Depression   . Schizophrenia   . Hyperlipidemia   . Hypertension   . Chronic pain     "over my whole body" (03/30/2013)  . 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 DVT (deep venous thrombosis)     per 01/17/10 d/c summary- "remote hx of dvt"?  . Iron deficiency anemia   . Glaucoma   . Hemorrhoids   . Asthma   . Anxiety   . GERD (gastroesophageal reflux disease)   . Psoriasis 04/29/2012    New onset evaluated by Dr Marylou Flesher, Dermatology, [facility] 6/13  Rx 0.1% Tacrolimus ointment  . Complication of anesthesia     "because I have sleep apnea" (03/30/2013)  . Heart murmur   . Chest pain, exertional   . Chronic bronchitis   . Sleep apnea     "waiting on my CPAP" (03/30/2013)  . Pneumonia     "a few times" (03/30/2013)  . Exertional shortness of breath     "& sometimes when laying down" (03/30/2013)  . Daily headache     "last 2 months" (03/30/2013)  .  Arthritis     "joints" (03/30/2013)  . Varicose veins of legs   . Psoriasis    Past Surgical History  Procedure Laterality Date  . Spinal fusion      2004  . Carpal tunnel release Bilateral   . Tubal ligation  1973  . Dilation and curettage of uterus  1970's    "once" (03/30/2013)  . 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 Joint replacement    . Vein surgery  left leg   Family History  Problem Relation Age of Onset  . Diabetes Sister   . Colon cancer Brother 40  . Colon cancer Brother    History  Substance Use Topics  . Smoking status: Never Smoker   . Smokeless tobacco: Never Used  . Alcohol Use: No   OB History   Grav Para Term Preterm Abortions TAB SAB Ect Mult Living                 Review of Systems  Constitutional: Negative for fever.  HENT: Negative for trouble swallowing.   Eyes: Negative for visual disturbance.  Respiratory: Negative for shortness of breath.   Cardiovascular: Negative for chest pain.  Gastrointestinal: Negative for nausea, vomiting and abdominal pain.  Genitourinary: Negative for dysuria.  Musculoskeletal: Positive for back pain. Negative for gait problem and neck pain.  Skin: Negative for rash.  Neurological: Positive for syncope. Negative for dizziness, weakness and numbness.      Allergies  Other  Home Medications   Prior to Admission medications   Medication Sig Start Date End Date Taking? Authorizing Provider  acetaminophen (TYLENOL) 500 MG tablet Take 500 mg by mouth every 6 (six) hours as needed for mild pain.   Yes Historical Provider, MD  albuterol (PROVENTIL HFA;VENTOLIN HFA) 108 (90 BASE) MCG/ACT inhaler Inhale 2 puffs into the lungs 2 (two) times daily as needed for wheezing or shortness of breath. 05/11/14  Yes Tyrone Nine, MD  atenolol (TENORMIN) 100 MG tablet Take 100 mg by mouth daily.   Yes Historical Provider, MD  esomeprazole (NEXIUM) 20 MG capsule Take 20 mg by mouth daily. Pt uses Nexium 24hour OTC product.   Yes  Historical Provider, MD  FLUoxetine (PROZAC) 20 MG capsule Take 3 capsules (60 mg total) by mouth daily. 05/23/14  Yes Tyrone Nine, MD  fluticasone (FLOVENT HFA) 220 MCG/ACT inhaler Inhale 2 puffs into the lungs 2 (two) times daily. 05/06/14  Yes Antony Madura, PA-C  gabapentin (NEURONTIN) 300 MG capsule Take 1 capsule (300 mg total) by mouth 3 (three) times daily. 05/11/14  Yes Tyrone Nine, MD  hydrochlorothiazide (HYDRODIURIL) 25 MG tablet Take 25 mg by mouth daily.  05/08/14  Yes Historical Provider, MD  hydrOXYzine (ATARAX/VISTARIL) 10 MG tablet Take 10 mg by mouth at bedtime as needed for itching.   Yes Historical Provider, MD  lisinopril (PRINIVIL,ZESTRIL) 10 MG tablet Take 10 mg by mouth daily.  05/08/14  Yes Historical Provider, MD  montelukast (SINGULAIR) 10 MG tablet Take 1 tablet (10 mg total) by mouth at bedtime. 05/11/14  Yes Tyrone Nine, MD  silver sulfADIAZINE (SILVADENE) 1 % cream Apply 1 application topically 3 (three) times daily. 05/11/14  Yes Tyrone Nine, MD  sucralfate (CARAFATE) 1 G tablet Take 1 tablet (1 g total) by mouth 4 (four) times daily. 05/11/14  Yes Tyrone Nine, MD  tacrolimus (PROTOPIC) 0.1 % ointment Apply 1 application topically 3 (three) times daily as needed (for legs). Apply to affected areas daily PRN 03/01/14 03/01/15 Yes Historical Provider, MD  traMADol (ULTRAM) 50 MG tablet Take 1 tablet (50 mg total) by mouth every 6 (six) hours as needed. 05/11/14  Yes Tyrone Nine, MD  triamcinolone cream (KENALOG) 0.1 % Apply 1 application topically 2 (two) times daily as needed (for legs).    Yes Historical Provider, MD  warfarin (COUMADIN) 5 MG tablet Take 1 tablet (5 mg total) by mouth daily. 01/28/14  Yes Levert Feinstein, MD  HYDROcodone-acetaminophen (NORCO/VICODIN) 5-325 MG per tablet Take 1-2 tablets by mouth every 6 (six) hours as needed for moderate pain or severe pain. 05/23/14   Monte Fantasia, PA-C   BP 174/93  Pulse 59  Temp(Src) 98.5 F (36.9 C) (Oral)  Resp 20   SpO2 98%  LMP 11/22/1996 Physical Exam  Nursing note and vitals reviewed. Constitutional: She is oriented to person, place, and time. She appears well-developed and well-nourished. No distress.  HENT:  Head: Normocephalic and atraumatic.  Mouth/Throat: Oropharynx is clear and moist. No oropharyngeal exudate.  Eyes: Right eye exhibits no discharge. Left eye exhibits no discharge. No scleral icterus.  Neck: Normal range of motion.  Cardiovascular: Normal rate, regular rhythm and normal heart sounds.   No murmur heard. Pulmonary/Chest: Effort normal and breath sounds normal. No accessory muscle usage. Not tachypneic. No respiratory distress.    Contusion noted  to right posterior ribs in the 10th to 11th rib. No deformity, crepitus, edema, erythema noted on exam.  Abdominal: Soft. There is no tenderness.  Musculoskeletal: Normal range of motion. She exhibits no edema and no tenderness.  Mild tenderness noted diffusely down patient's spine, patient states this is baseline for her. No obvious deformity, ecchymosis, contusion, abrasions, obvious signs of trauma to patient's spine.  Neurological: She is alert and oriented to person, place, and time. She has normal strength. No cranial nerve deficit or sensory deficit. Coordination normal.  Skin: Skin is warm and dry. No rash noted. She is not diaphoretic.  Psychiatric: She has a normal mood and affect.    ED Course  Procedures (including critical care time) Labs Review Labs Reviewed  I-STAT CHEM 8, ED    Imaging Review Dg Ribs Unilateral W/chest Right  05/23/2014   CLINICAL DATA:  Fall.  Right rib pain.  EXAM: RIGHT RIBS AND CHEST - 3+ VIEW  COMPARISON:  Chest x-ray 04/23/2014  FINDINGS: Minimal right base atelectasis. Heart is normal size. Tortuosity of the thoracic aorta. No visible rib fracture. No effusion or pneumothorax.  IMPRESSION: Minimal right base atelectasis.  No visible rib fracture.   Electronically Signed   By: Charlett Nose  M.D.   On: 05/23/2014 16:33     EKG Interpretation None      MDM   Final diagnoses:  Contusion   73 year old female past medical history of chronic back pain, peripheral neuropathy, syncopal episodes, factor V Leiden on Coumadin presenting to the ER today after falling 7 days ago. Patient states she did have syncopal episode and does not remember hitting anything. Patient states she has had some right posterior chest wall pain since last Monday. She states her pain has been getting worse since then. Workup to include i-STAT 8, right rib unilateral and chest x-ray, EKG.   6:00 PM:  Labs, EKG unremarkable. Chest x-ray with a right unilateral rib x-ray returns with impression of no visible rib fracture. Based on physical exam findings, with no evidence of new weakness, new back pain, radiculopathy, paresthesias, and no new tenderness to the spinous processes of back, and obvious contusion to ribs 10 -11 region lateral to patient's spine we will discharge patient this time. No neurological deficits and normal neuro exam.  No loss of bowel or bladder control.  No concern for cauda equina.  No fever, night sweats, weight loss, h/o cancer, IVDU.  RICE protocol and pain medicine indicated and discussed with patient. We encouraged patient to follow up with her primary care physician.  We encouraged patient to call or return to the ER should she have any questions or concerns.   Filed Vitals:   05/23/14 1825  BP: 174/93  Pulse: 59  Temp: 98.5 F (36.9 C)  Resp: 20    Signed,  Ladona Mow, PA-C 2:51 AM  This patient seen and discussed with Dr. Lorre Nick, M.D.     Monte Fantasia, PA-C 05/24/14 [phone]

## 2014-05-23 NOTE — ED Notes (Signed)
Per EMS pt coming from home with c/o fall last week, can't remember details about the fall, EMS sts pt has bruising to right, mid back. Pt has hx of chronic back pain, and sts has gotten worse after fall. VSS

## 2014-05-24 ENCOUNTER — Other Ambulatory Visit: Payer: PRIVATE HEALTH INSURANCE

## 2014-05-24 ENCOUNTER — Ambulatory Visit: Payer: PRIVATE HEALTH INSURANCE

## 2014-05-25 ENCOUNTER — Other Ambulatory Visit: Payer: Self-pay | Admitting: *Deleted

## 2014-05-25 ENCOUNTER — Other Ambulatory Visit: Payer: PRIVATE HEALTH INSURANCE

## 2014-05-25 ENCOUNTER — Ambulatory Visit: Payer: PRIVATE HEALTH INSURANCE

## 2014-05-25 NOTE — ED Provider Notes (Signed)
Medical screening examination/treatment/procedure(s) were conducted as a shared visit with non-physician practitioner(s) and myself.  I personally evaluated the patient during the encounter.   EKG Interpretation   Date/Time:  Sunday May 23 2014 17:12:37 EDT Ventricular Rate:  72 PR Interval:  210 QRS Duration: 79 QT Interval:  388 QTC Calculation: 425 R Axis:   65 Text Interpretation:  Sinus rhythm Abnormal R-wave progression, early  transition ED PHYSICIAN INTERPRETATION AVAILABLE IN [facility]  Confirmed by TEST, Record ([id number]) on 05/25/2014 7:05:55 AM       [name], MD 05/25/14 1730

## 2014-05-26 MED ORDER — FLUOXETINE HCL 20 MG PO CAPS: 60.0000 mg | ORAL_CAPSULE | Freq: Every day | ORAL | Status: DC

## 2014-05-27 NOTE — Progress Notes (Signed)
Patient ID: [name] [name], female   DOB: 27-Nov-1940, 73 y.o.   MRN: [id number]   Subjective:   [name] [name] is a 73 y.o. female here for pain management.  She reports severe, nonremitting, constant sharp, deep, aching pain "all over" worse in her back and extremities without associated symptoms or rasdiation made better only by opioid analgesics.   This is adversely affecting all aspects of her life including sleep, mood (irritability), relationships (curses at friends sometimes). Though with better pain control she is able to improve physical activity and look after her grandchildren.  Review of Systems:  Denies HA, dizziness, vision or hearing changes, CP, SOB, abd pain, N/V/D, constipation, urinary problems, rashes, swollen joints  Objective:  Physical Exam: BP 148/71  Pulse 63  Temp(Src) 99.5 F (37.5 C) (Oral)  Ht  (1.651 m)  Wt 245 lb (111.131 kg)  BMI 40.77 kg/m2  LMP 11/22/1996  Gen:  73 y.o. female in NAD MSK: No edema noted, very limited back ROM due to nocifensive response Neuro: Alert and oriented, speech normal, antalgic gait with cane  Assessment:     [name] [name] is a 73 y.o. female here for pain management.     Plan:     See problem list for problem-specific plans.  Chronic Pain Disorder:  - Discussed goals of opioid treatment including: significant improvement in functional status and not necessarily pain resolution.  - Risks communicated including: nausea, vomiting, confusion, sleepiness, fatigue, constipation, worsening sleep apnea, impairment of operation of equipment or vehicles, possible long-term effects on neuro-endocrine system.  - Written pain agreement discussed and signed.  - UDS at next office visit - Follow up in 1 month.

## 2014-05-28 ENCOUNTER — Telehealth: Payer: Self-pay | Admitting: *Deleted

## 2014-05-28 ENCOUNTER — Other Ambulatory Visit: Payer: Self-pay | Admitting: *Deleted

## 2014-05-28 DIAGNOSIS — M5441 Lumbago with sciatica, right side: Secondary | ICD-10-CM

## 2014-05-28 DIAGNOSIS — M5442 Lumbago with sciatica, left side: Principal | ICD-10-CM

## 2014-05-28 MED ORDER — GABAPENTIN 300 MG PO CAPS: 300.0000 mg | ORAL_CAPSULE | Freq: Three times a day (TID) | ORAL | Status: DC

## 2014-05-28 NOTE — Telephone Encounter (Signed)
Patient states transferring care to office "on [facility] [address]  Wants to switch to a "lady doctor", but does not know name.  Informed patient that she or her new provider will need to complete a release of information to have records transferred.  Will change PCP in Epic once ROI has been completed.  Patient verbalized understanding.  Altamese Dilling, BSN, RN-BC

## 2014-05-28 NOTE — Telephone Encounter (Signed)
Pt also requested a refill on Flexeril.  Clovis Pu, RN

## 2014-06-01 NOTE — Telephone Encounter (Signed)
See phone note for 05/28/14--Patient called and states she is transferring her care to practice on "[address] [employer]  Will await release of information to transfer records to new provider.  Patient informed.  Altamese Dilling, BSN, RN-BC

## 2014-06-02 ENCOUNTER — Ambulatory Visit: Payer: PRIVATE HEALTH INSURANCE | Admitting: Family Medicine

## 2014-06-02 ENCOUNTER — Telehealth: Payer: Self-pay | Admitting: Pharmacist

## 2014-06-02 NOTE — Telephone Encounter (Signed)
Pt called [facility] pharmacy stating she would be here tomorrow for her lab/CC appt. - GT

## 2014-06-03 ENCOUNTER — Other Ambulatory Visit (HOSPITAL_BASED_OUTPATIENT_CLINIC_OR_DEPARTMENT_OTHER): Payer: PRIVATE HEALTH INSURANCE

## 2014-06-03 ENCOUNTER — Ambulatory Visit (HOSPITAL_BASED_OUTPATIENT_CLINIC_OR_DEPARTMENT_OTHER): Payer: PRIVATE HEALTH INSURANCE

## 2014-06-03 DIAGNOSIS — D6859 Other primary thrombophilia: Secondary | ICD-10-CM

## 2014-06-03 DIAGNOSIS — Z7901 Long term (current) use of anticoagulants: Secondary | ICD-10-CM

## 2014-06-03 DIAGNOSIS — I2699 Other pulmonary embolism without acute cor pulmonale: Secondary | ICD-10-CM

## 2014-06-03 DIAGNOSIS — D6851 Activated protein C resistance: Secondary | ICD-10-CM

## 2014-06-03 LAB — PROTIME-INR
INR: 3.2 (ref 2.00–3.50)
PROTIME: 38.4 s — AB (ref 10.6–13.4)

## 2014-06-03 LAB — POCT INR
INR: 3.2
INR: 3.2

## 2014-06-03 NOTE — Progress Notes (Signed)
(  Patient is followed by Dr. Cyndie Chime)  [name] INR today is 3.2 on Coumadin 5 mg daily, which is slightly above her goal range of 2-3. She reports no bleeding and/or unusual bruising. No missed and/or extra doses, and she is able to verbalize her current Coumadin dose (one peach tablet every day). We reviewed her medication list today, comparing it with the bottles of her medicines which she brought with her to clinic today.  Since her last visit, she was prescribed furosemide which was supposed to replace her hydrochlorothiazide (HCTZ), but [name] had accidentally still been taking both medications. Notably, she also had a recent visit to the ED after falling, presumably due to syncope. We discussed with her that based on the ED provider's note from 04/25/14, the furosemide was supposed to replace the HCTZ so we discarded her bottle of HCTZ for her to avoid confusion and potential hypotension. She also started taking [employer] about 1 week ago to help with hot flashes. Using the [employer] Medic[employer] database, there was a moderate interaction noted between this and Coumadin, and it can potentially increase the risk of bleeding. This may also at least partially explain the slightly higher INR today. We discussed this with the patient, and she stated she would rather stop the [place] than have it affect her Coumadin therapy, and that is what she plans to do.  She says her diet has not changed, though she is trying to drink less carbonated beverages/caffeine and drink water instead.  [name] mentioned that she had several doctor appointments in the near future, including potentially to get a "shot in her knee". She knows to keep [place] informed of any procedures as her Coumadin may affect these appointments.  Plan: [name] will continue Coumadin 5 mg daily, and we will see her in 3 weeks for another INR check (on 06/22/14: lab at 11am and coumadin clinic at 11:15am) She has been  maintained on this dose for a while, and its simplicity is advantageous.  She knows to call us in the meantime if she has any issues.

## 2014-06-06 ENCOUNTER — Emergency Department (HOSPITAL_COMMUNITY)
Admission: EM | Admit: 2014-06-06 | Discharge: 2014-06-06 | Disposition: A | Discharge status: Home / Self Care | Payer: PRIVATE HEALTH INSURANCE | Attending: Emergency Medicine | Admitting: Emergency Medicine

## 2014-06-06 ENCOUNTER — Encounter (HOSPITAL_COMMUNITY): Payer: Self-pay | Admitting: Emergency Medicine

## 2014-06-06 DIAGNOSIS — M545 Low back pain, unspecified: Secondary | ICD-10-CM | POA: Insufficient documentation

## 2014-06-06 DIAGNOSIS — R011 Cardiac murmur, unspecified: Secondary | ICD-10-CM | POA: Insufficient documentation

## 2014-06-06 DIAGNOSIS — IMO0002 Reserved for concepts with insufficient information to code with codable children: Secondary | ICD-10-CM | POA: Diagnosis not present

## 2014-06-06 DIAGNOSIS — Z862 Personal history of diseases of the blood and blood-forming organs and certain disorders involving the immune mechanism: Secondary | ICD-10-CM | POA: Diagnosis not present

## 2014-06-06 DIAGNOSIS — Z872 Personal history of diseases of the skin and subcutaneous tissue: Secondary | ICD-10-CM | POA: Insufficient documentation

## 2014-06-06 DIAGNOSIS — Z86718 Personal history of other venous thrombosis and embolism: Secondary | ICD-10-CM | POA: Diagnosis not present

## 2014-06-06 DIAGNOSIS — Z8701 Personal history of pneumonia (recurrent): Secondary | ICD-10-CM | POA: Diagnosis not present

## 2014-06-06 DIAGNOSIS — I10 Essential (primary) hypertension: Secondary | ICD-10-CM | POA: Diagnosis not present

## 2014-06-06 DIAGNOSIS — Z8669 Personal history of other diseases of the nervous system and sense organs: Secondary | ICD-10-CM | POA: Insufficient documentation

## 2014-06-06 DIAGNOSIS — Z86711 Personal history of pulmonary embolism: Secondary | ICD-10-CM | POA: Diagnosis not present

## 2014-06-06 DIAGNOSIS — Z8639 Personal history of other endocrine, nutritional and metabolic disease: Secondary | ICD-10-CM | POA: Insufficient documentation

## 2014-06-06 DIAGNOSIS — J45909 Unspecified asthma, uncomplicated: Secondary | ICD-10-CM | POA: Insufficient documentation

## 2014-06-06 DIAGNOSIS — K219 Gastro-esophageal reflux disease without esophagitis: Secondary | ICD-10-CM | POA: Insufficient documentation

## 2014-06-06 DIAGNOSIS — M129 Arthropathy, unspecified: Secondary | ICD-10-CM | POA: Insufficient documentation

## 2014-06-06 DIAGNOSIS — F411 Generalized anxiety disorder: Secondary | ICD-10-CM | POA: Diagnosis not present

## 2014-06-06 DIAGNOSIS — M549 Dorsalgia, unspecified: Secondary | ICD-10-CM

## 2014-06-06 DIAGNOSIS — G8929 Other chronic pain: Secondary | ICD-10-CM | POA: Insufficient documentation

## 2014-06-06 DIAGNOSIS — Z7901 Long term (current) use of anticoagulants: Secondary | ICD-10-CM | POA: Diagnosis not present

## 2014-06-06 DIAGNOSIS — Z79899 Other long term (current) drug therapy: Secondary | ICD-10-CM | POA: Insufficient documentation

## 2014-06-06 MED ORDER — HYDROCODONE-ACETAMINOPHEN 5-325 MG PO TABS: 2.0000 | ORAL_TABLET | Freq: Once | ORAL | Status: DC

## 2014-06-06 MED ORDER — HYDROCODONE-ACETAMINOPHEN 5-325 MG PO TABS
2.0000 | ORAL_TABLET | Freq: Once | ORAL | Status: AC
  Administered 2014-06-06: 2 via ORAL
  Filled 2014-06-06: qty 2

## 2014-06-06 NOTE — ED Notes (Addendum)
Per [facility] pt coming from home with c/o chronic back pain, sts she is out of her pain meds and reports hx of "spinal confusion"

## 2014-06-06 NOTE — ED Provider Notes (Signed)
CSN: [id number]     Arrival date & time 06/06/14  4098 History   First MD Initiated Contact with Patient 06/06/14 0732     Chief Complaint  Patient presents with  . Back Pain     (Consider location/radiation/quality/duration/timing/severity/associated sxs/prior Treatment) HPI Comments: Patient here complaining of worsening chronic back pain without new neurological findings. No recent history of injury. Has run out of her normal pain medication. Has used hydrocodone in the past. No change in bowel or bladder function. No rashes noted. Symptoms persisted. She has a refill of her prescriptions which she will obtain tomorrow. Her symptoms worse with movement and better with rest.  Patient is a 73 y.o. female presenting with back pain. The history is provided by the patient.  Back Pain   Past Medical History  Diagnosis Date  . Depression   . Schizophrenia   . Hyperlipidemia   . Hypertension   . Chronic pain     "over my whole body" (03/30/2013)  . 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 DVT (deep venous thrombosis)     per 01/17/10 d/c summary- "remote hx of dvt"?  . Iron deficiency anemia   . Glaucoma   . Hemorrhoids   . Asthma   . Anxiety   . GERD (gastroesophageal reflux disease)   . Psoriasis 04/29/2012    New onset evaluated by Dr Marylou Flesher, Dermatology, [facility] 6/13  Rx 0.1% Tacrolimus ointment  . Complication of anesthesia     "because I have sleep apnea" (03/30/2013)  . Heart murmur   . Chest pain, exertional   . Chronic bronchitis   . Sleep apnea     "waiting on my CPAP" (03/30/2013)  . Pneumonia     "a few times" (03/30/2013)  . Exertional shortness of breath     "& sometimes when laying down" (03/30/2013)  . Daily headache     "last 2 months" (03/30/2013)  . Arthritis     "joints" (03/30/2013)  . Varicose veins of legs   . Psoriasis    Past Surgical History  Procedure Laterality Date  . Spinal fusion      2004  . Carpal tunnel release  Bilateral   . Tubal ligation  1973  . Dilation and curettage of uterus  1970's    "once" (03/30/2013)  . 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 Joint replacement    . Vein surgery  left leg   Family History  Problem Relation Age of Onset  . Diabetes Sister   . Colon cancer Brother 40  . Colon cancer Brother    History  Substance Use Topics  . Smoking status: Never Smoker   . Smokeless tobacco: Never Used  . Alcohol Use: No   OB History   Grav Para Term Preterm Abortions TAB SAB Ect Mult Living                 Review of Systems  Musculoskeletal: Positive for back pain.  All other systems reviewed and are negative.     Allergies  Other  Home Medications   Prior to Admission medications   Medication Sig Start Date End Date Taking? Authorizing Provider  acetaminophen (TYLENOL) 500 MG tablet Take 500 mg by mouth every 6 (six) hours as needed for mild pain.    Historical Provider, MD  albuterol (PROVENTIL HFA;VENTOLIN HFA) 108 (90 BASE) MCG/ACT inhaler Inhale 2 puffs into the lungs 2 (two) times daily as needed for  wheezing or shortness of breath. 05/11/14   Tyrone Nine, MD  atenolol (TENORMIN) 100 MG tablet Take 100 mg by mouth daily.    Historical Provider, MD  esomeprazole (NEXIUM) 20 MG capsule Take 20 mg by mouth daily. Pt uses Nexium 24hour OTC product.    Historical Provider, MD  FLUoxetine (PROZAC) 20 MG capsule Take 3 capsules (60 mg total) by mouth daily. 05/26/14   Tyrone Nine, MD  fluticasone (FLOVENT HFA) 220 MCG/ACT inhaler Inhale 2 puffs into the lungs 2 (two) times daily. 05/06/14   Antony Madura, PA-C  furosemide (LASIX) 20 MG tablet Take 20 mg by mouth daily.  04/25/14   Historical Provider, MD  gabapentin (NEURONTIN) 300 MG capsule Take 1 capsule (300 mg total) by mouth 3 (three) times daily. 05/28/14   Tyrone Nine, MD  HYDROcodone-acetaminophen (NORCO/VICODIN) 5-325 MG per tablet Take 1-2 tablets by mouth every 6 (six) hours as needed for moderate pain or severe  pain. 05/23/14   Monte Fantasia, PA-C  hydrOXYzine (ATARAX/VISTARIL) 10 MG tablet Take 10 mg by mouth at bedtime as needed for itching.    Historical Provider, MD  lisinopril (PRINIVIL,ZESTRIL) 10 MG tablet Take 10 mg by mouth daily.  05/08/14   Historical Provider, MD  Misc Natural Products (ESTROVEN ENERGY PO) Take 1 tablet by mouth daily. 05/25/14   Historical Provider, MD  montelukast (SINGULAIR) 10 MG tablet Take 1 tablet (10 mg total) by mouth at bedtime. 05/11/14   Tyrone Nine, MD  silver sulfADIAZINE (SILVADENE) 1 % cream Apply 1 application topically 3 (three) times daily. 05/11/14   Tyrone Nine, MD  sucralfate (CARAFATE) 1 G tablet Take 1 tablet (1 g total) by mouth 4 (four) times daily. 05/11/14   Tyrone Nine, MD  tacrolimus (PROTOPIC) 0.1 % ointment Apply 1 application topically 3 (three) times daily as needed (for legs). Apply to affected areas daily PRN 03/01/14 03/01/15  Historical Provider, MD  traMADol (ULTRAM) 50 MG tablet Take 1 tablet (50 mg total) by mouth every 6 (six) hours as needed. 05/11/14   Tyrone Nine, MD  triamcinolone cream (KENALOG) 0.1 % Apply 1 application topically 2 (two) times daily as needed (for legs).     Historical Provider, MD  warfarin (COUMADIN) 5 MG tablet Take 1 tablet (5 mg total) by mouth daily. 01/28/14   Levert Feinstein, MD   BP 138/71  Pulse 54  Temp(Src) 98.4 F (36.9 C)  SpO2 97%  LMP 11/22/1996 Physical Exam  Nursing note and vitals reviewed. Constitutional: She is oriented to person, place, and time. She appears well-developed and well-nourished.  Non-toxic appearance. No distress.  HENT:  Head: Normocephalic and atraumatic.  Eyes: Conjunctivae, EOM and lids are normal. Pupils are equal, round, and reactive to light.  Neck: Normal range of motion. Neck supple. No tracheal deviation present. No mass present.  Cardiovascular: Normal rate, regular rhythm and normal heart sounds.  Exam reveals no gallop.   No murmur heard. Pulmonary/Chest:  Effort normal and breath sounds normal. No stridor. No respiratory distress. She has no decreased breath sounds. She has no wheezes. She has no rhonchi. She has no rales.  Abdominal: Soft. Normal appearance and bowel sounds are normal. She exhibits no distension. There is no tenderness. There is no rebound and no CVA tenderness.  Musculoskeletal: Normal range of motion. She exhibits no edema and no tenderness.  Neurological: She is alert and oriented to person, place, and time. She has normal strength.  No cranial nerve deficit or sensory deficit. GCS eye subscore is 4. GCS verbal subscore is 5. GCS motor subscore is 6.  Skin: Skin is warm and dry. No abrasion and no rash noted.  Psychiatric: She has a normal mood and affect. Her speech is normal and behavior is normal.    ED Course  Procedures (including critical care time) Labs Review Labs Reviewed - No data to display  Imaging Review No results found.   EKG Interpretation None      MDM   Final diagnoses:  None    Patient given 2 Vicodin here and will followup with her doctor.    [name], MD 06/06/14 [phone]

## 2014-06-06 NOTE — ED Notes (Signed)
Bed: [facility] Expected date:  Expected time:  Means of arrival:  Comments: EMS back pain x 1 month

## 2014-06-06 NOTE — Discharge Instructions (Signed)

## 2014-06-06 NOTE — ED Notes (Signed)
She c/o recurrence of her arthritic low back pain.  She is in no distress and is in good spirits.

## 2014-06-15 ENCOUNTER — Telehealth: Payer: Self-pay | Admitting: Family Medicine

## 2014-06-15 NOTE — Telephone Encounter (Signed)
Needs refill on tramadol [facility] on [address]

## 2014-06-16 ENCOUNTER — Emergency Department (HOSPITAL_COMMUNITY)
Admission: EM | Admit: 2014-06-16 | Discharge: 2014-06-16 | Disposition: A | Discharge status: Home / Self Care | Payer: PRIVATE HEALTH INSURANCE | Attending: Emergency Medicine | Admitting: Emergency Medicine

## 2014-06-16 ENCOUNTER — Encounter (HOSPITAL_COMMUNITY): Payer: Self-pay | Admitting: Emergency Medicine

## 2014-06-16 ENCOUNTER — Telehealth: Payer: Self-pay | Admitting: *Deleted

## 2014-06-16 DIAGNOSIS — E785 Hyperlipidemia, unspecified: Secondary | ICD-10-CM | POA: Insufficient documentation

## 2014-06-16 DIAGNOSIS — Y9389 Activity, other specified: Secondary | ICD-10-CM | POA: Insufficient documentation

## 2014-06-16 DIAGNOSIS — J45909 Unspecified asthma, uncomplicated: Secondary | ICD-10-CM | POA: Insufficient documentation

## 2014-06-16 DIAGNOSIS — K219 Gastro-esophageal reflux disease without esophagitis: Secondary | ICD-10-CM | POA: Diagnosis not present

## 2014-06-16 DIAGNOSIS — I10 Essential (primary) hypertension: Secondary | ICD-10-CM | POA: Insufficient documentation

## 2014-06-16 DIAGNOSIS — R011 Cardiac murmur, unspecified: Secondary | ICD-10-CM | POA: Diagnosis not present

## 2014-06-16 DIAGNOSIS — Z86711 Personal history of pulmonary embolism: Secondary | ICD-10-CM | POA: Insufficient documentation

## 2014-06-16 DIAGNOSIS — R609 Edema, unspecified: Secondary | ICD-10-CM | POA: Insufficient documentation

## 2014-06-16 DIAGNOSIS — Z86718 Personal history of other venous thrombosis and embolism: Secondary | ICD-10-CM | POA: Insufficient documentation

## 2014-06-16 DIAGNOSIS — Y9289 Other specified places as the place of occurrence of the external cause: Secondary | ICD-10-CM | POA: Diagnosis not present

## 2014-06-16 DIAGNOSIS — F411 Generalized anxiety disorder: Secondary | ICD-10-CM | POA: Insufficient documentation

## 2014-06-16 DIAGNOSIS — M129 Arthropathy, unspecified: Secondary | ICD-10-CM | POA: Diagnosis not present

## 2014-06-16 DIAGNOSIS — G4730 Sleep apnea, unspecified: Secondary | ICD-10-CM | POA: Insufficient documentation

## 2014-06-16 DIAGNOSIS — Z872 Personal history of diseases of the skin and subcutaneous tissue: Secondary | ICD-10-CM | POA: Insufficient documentation

## 2014-06-16 DIAGNOSIS — IMO0002 Reserved for concepts with insufficient information to code with codable children: Secondary | ICD-10-CM | POA: Insufficient documentation

## 2014-06-16 DIAGNOSIS — Z862 Personal history of diseases of the blood and blood-forming organs and certain disorders involving the immune mechanism: Secondary | ICD-10-CM | POA: Insufficient documentation

## 2014-06-16 DIAGNOSIS — Z79899 Other long term (current) drug therapy: Secondary | ICD-10-CM | POA: Insufficient documentation

## 2014-06-16 DIAGNOSIS — W1809XA Striking against other object with subsequent fall, initial encounter: Secondary | ICD-10-CM | POA: Diagnosis not present

## 2014-06-16 DIAGNOSIS — Z8659 Personal history of other mental and behavioral disorders: Secondary | ICD-10-CM | POA: Insufficient documentation

## 2014-06-16 DIAGNOSIS — F3289 Other specified depressive episodes: Secondary | ICD-10-CM | POA: Insufficient documentation

## 2014-06-16 DIAGNOSIS — Z792 Long term (current) use of antibiotics: Secondary | ICD-10-CM | POA: Insufficient documentation

## 2014-06-16 DIAGNOSIS — Z7901 Long term (current) use of anticoagulants: Secondary | ICD-10-CM | POA: Diagnosis not present

## 2014-06-16 DIAGNOSIS — Z9981 Dependence on supplemental oxygen: Secondary | ICD-10-CM | POA: Diagnosis not present

## 2014-06-16 DIAGNOSIS — Z8701 Personal history of pneumonia (recurrent): Secondary | ICD-10-CM | POA: Insufficient documentation

## 2014-06-16 DIAGNOSIS — F329 Major depressive disorder, single episode, unspecified: Secondary | ICD-10-CM | POA: Diagnosis not present

## 2014-06-16 DIAGNOSIS — G8929 Other chronic pain: Secondary | ICD-10-CM | POA: Diagnosis not present

## 2014-06-16 MED ORDER — HYDROCODONE-ACETAMINOPHEN 5-325 MG PO TABS
2.0000 | ORAL_TABLET | Freq: Once | ORAL | Status: AC
  Administered 2014-06-16: 2 via ORAL
  Filled 2014-06-16: qty 2

## 2014-06-16 MED ORDER — HYDROCODONE-ACETAMINOPHEN 5-325 MG PO TABS: 1.0000 | ORAL_TABLET | ORAL | Status: DC | PRN

## 2014-06-16 NOTE — Telephone Encounter (Signed)
Pt asked for this to be sent to "head nurse"

## 2014-06-16 NOTE — Telephone Encounter (Signed)
Message left per pt -pt has question about her leg; wants to talk to u about change of color but does not want an appt. Telephone# [phone]. Thanks

## 2014-06-16 NOTE — ED Provider Notes (Addendum)
CSN: [id number]     Arrival date & time 06/16/14  1749 History   First MD Initiated Contact with Patient 06/16/14 1933     Chief Complaint  Patient presents with  . Fall  . Back Pain  . body pain      (Consider location/radiation/quality/duration/timing/severity/associated sxs/prior Treatment) HPI 73 year old female presents with severe pain. She describes this as an allover body pain. She states she's had this pain for years. She is chronically on tramadol but has been out of this recently and received hydrocodone in the emergency department a couple weeks ago. She endorses that she does not like her current PCP or any other PCPs in [place] and is trying to get into [facility]. She states nothing is different is that fact that she's not had pain medicines over a week. She denies any fevers, joint swelling, chest pain, headaches, or shortness of breath. No abdominal pain. She does chronically have lower extremity swelling but states is not worse than normal. She also states that she is confused about which medicines are for which medical conditions and was hoping someone to help go over her medicines with her. She's not sure why she is on 3 blood pressure medicines and thus did not take any of her hypertensive medicines this morning. She does report that she's fallen, but has not fallen in over 1 week.  Past Medical History  Diagnosis Date  . Depression   . Schizophrenia   . Hyperlipidemia   . Hypertension   . Chronic pain     "over my whole body" (03/30/2013)  . 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 DVT (deep venous thrombosis)     per 01/17/10 d/c summary- "remote hx of dvt"?  . Iron deficiency anemia   . Glaucoma   . Hemorrhoids   . Asthma   . Anxiety   . GERD (gastroesophageal reflux disease)   . Psoriasis 04/29/2012    New onset evaluated by Dr Marylou Flesher, Dermatology, [facility] 6/13  Rx 0.1% Tacrolimus ointment  . Complication of anesthesia      "because I have sleep apnea" (03/30/2013)  . Heart murmur   . Chest pain, exertional   . Chronic bronchitis   . Sleep apnea     "waiting on my CPAP" (03/30/2013)  . Pneumonia     "a few times" (03/30/2013)  . Exertional shortness of breath     "& sometimes when laying down" (03/30/2013)  . Daily headache     "last 2 months" (03/30/2013)  . Arthritis     "joints" (03/30/2013)  . Varicose veins of legs   . Psoriasis    Past Surgical History  Procedure Laterality Date  . Spinal fusion      2004  . Carpal tunnel release Bilateral   . Tubal ligation  1973  . Dilation and curettage of uterus  1970's    "once" (03/30/2013)  . 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 Joint replacement    . Vein surgery  left leg   Family History  Problem Relation Age of Onset  . Diabetes Sister   . Colon cancer Brother 40  . Colon cancer Brother    History  Substance Use Topics  . Smoking status: Never Smoker   . Smokeless tobacco: Never Used  . Alcohol Use: No   OB History   Grav Para Term Preterm Abortions TAB SAB Ect Mult Living  Review of Systems  Constitutional: Negative for fever.  Respiratory: Negative for shortness of breath.   Cardiovascular: Positive for leg swelling. Negative for chest pain.  Gastrointestinal: Negative for abdominal pain.  Genitourinary: Negative for dysuria.  Musculoskeletal: Positive for arthralgias.  All other systems reviewed and are negative.     Allergies  Other  Home Medications   Prior to Admission medications   Medication Sig Start Date End Date Taking? Authorizing Provider  albuterol (PROVENTIL HFA;VENTOLIN HFA) 108 (90 BASE) MCG/ACT inhaler Inhale 2 puffs into the lungs 2 (two) times daily as needed for wheezing or shortness of breath. 05/11/14  Yes Tyrone Nine, MD  atenolol (TENORMIN) 100 MG tablet Take 100 mg by mouth daily.   Yes Historical Provider, MD  esomeprazole (NEXIUM) 20 MG capsule Take 20 mg by mouth daily. Pt uses  Nexium 24hour OTC product.   Yes Historical Provider, MD  FLUoxetine (PROZAC) 20 MG capsule Take 3 capsules (60 mg total) by mouth daily. 05/26/14  Yes Tyrone Nine, MD  fluticasone (FLOVENT HFA) 220 MCG/ACT inhaler Inhale 2 puffs into the lungs 2 (two) times daily. 05/06/14  Yes Antony Madura, PA-C  furosemide (LASIX) 20 MG tablet Take 20 mg by mouth daily.  04/25/14  Yes Historical Provider, MD  gabapentin (NEURONTIN) 300 MG capsule Take 1 capsule (300 mg total) by mouth 3 (three) times daily. 05/28/14  Yes Tyrone Nine, MD  hydrochlorothiazide (HYDRODIURIL) 25 MG tablet Take 25 mg by mouth daily.   Yes Historical Provider, MD  lisinopril (PRINIVIL,ZESTRIL) 10 MG tablet Take 10 mg by mouth daily.  05/08/14  Yes Historical Provider, MD  traMADol (ULTRAM) 50 MG tablet Take 50 mg by mouth every 6 (six) hours as needed for moderate pain.   Yes Historical Provider, MD  warfarin (COUMADIN) 5 MG tablet Take 1 tablet (5 mg total) by mouth daily. 01/28/14  Yes Levert Feinstein, MD  acetaminophen (TYLENOL) 500 MG tablet Take 500 mg by mouth every 6 (six) hours as needed for mild pain.    Historical Provider, MD  hydrOXYzine (ATARAX/VISTARIL) 10 MG tablet Take 10 mg by mouth at bedtime as needed for itching.    Historical Provider, MD  Misc Natural Products (ESTROVEN ENERGY PO) Take 1 tablet by mouth daily. 05/25/14   Historical Provider, MD  montelukast (SINGULAIR) 10 MG tablet Take 1 tablet (10 mg total) by mouth at bedtime. 05/11/14   Tyrone Nine, MD  silver sulfADIAZINE (SILVADENE) 1 % cream Apply 1 application topically 3 (three) times daily. 05/11/14   Tyrone Nine, MD  sucralfate (CARAFATE) 1 G tablet Take 1 tablet (1 g total) by mouth 4 (four) times daily. 05/11/14   Tyrone Nine, MD  tacrolimus (PROTOPIC) 0.1 % ointment Apply 1 application topically 3 (three) times daily as needed (for legs). Apply to affected areas daily PRN 03/01/14 03/01/15  Historical Provider, MD  triamcinolone cream (KENALOG) 0.1 % Apply 1  application topically 2 (two) times daily as needed (for legs).     Historical Provider, MD   BP 157/75  Pulse 57  Temp(Src) 98.6 F (37 C) (Oral)  Resp 18  SpO2 97%  LMP 11/22/1996 Physical Exam  Nursing note and vitals reviewed. Constitutional: She is oriented to person, place, and time. She appears well-developed and well-nourished. No distress.  Patient appears comfortable sitting in bed  HENT:  Head: Normocephalic and atraumatic.  Right Ear: External ear normal.  Left Ear: External ear normal.  Nose: Nose normal.  Eyes: Right eye exhibits no discharge. Left eye exhibits no discharge.  Cardiovascular: Normal rate, regular rhythm and normal heart sounds.   Pulmonary/Chest: Effort normal and breath sounds normal.  Abdominal: Soft. She exhibits no distension. There is no tenderness.  Musculoskeletal: She exhibits edema (Bilateral lower extremity edema. Mild pitting.).  No focal joint tenderness or joint swelling  Neurological: She is alert and oriented to person, place, and time.  Skin: Skin is warm and dry. She is not diaphoretic. No erythema.    ED Course  Procedures (including critical care time) Labs Review Labs Reviewed - No data to display  Imaging Review No results found.   EKG Interpretation None      MDM   Final diagnoses:  Chronic pain    Patient present no acute emergent condition at this time. She does have lower extremity swelling she states this is not worse than typical. I reviewed all of her medicines with her and explained which ones were the blood pressure medicines. Will give small Rx of pain meds and noted that the ER is not the appropriate place for chronic pain med refills. She is getting a PCP ASAP per her.    Audree Camel, MD 06/16/14 2107  Audree Camel, MD 06/16/14 2117

## 2014-06-16 NOTE — Discharge Instructions (Signed)

## 2014-06-16 NOTE — ED Notes (Signed)
Pt reports she fell 1 week ago, hurt her knees, hit her head, denies LOC. Reports she has been trying to get a PCP for pain medications. Body pain x1 month. Pain 10/10. Ambulatory with cane.

## 2014-06-16 NOTE — Progress Notes (Signed)
  CARE MANAGEMENT ED NOTE 06/16/2014  Patient:  [name] [name], [name] [name]   Account Number:  [id number]  Date Initiated:  06/16/2014  Documentation initiated by:  Radford Pax  Subjective/Objective Assessment:   Patient presents to Ed with generalized severe pain.     Subjective/Objective Assessment Detail:   Patient has pmhx of depression, schizophrenia, hyperlipidemia, HTN, DVT, PE, [place], anxiety, GERD, sleep apnea, psoriosis.  Patient reports she has been without her pain medication for a month.     Action/Plan:   Action/Plan Detail:   Anticipated DC Date:  06/16/2014     Status Recommendation to Physician:   Result of Recommendation:    Other ED Services  Consult Working Plan    DC Planning Services  Other  PCP issues    Choice offered to / List presented to:            Status of service:  Completed, signed off  ED Comments:   ED Comments Detail:  EDCM spoke to patient at bedside.  Patient awake alert oriented x3, pleasant, calm.  Patient noted to have visited the ED 16 times within the last six months.  Patient is aware that the ED is used for, "Emergencies and people who are dying."  Patient reports she has left her current pcp because, "He doesn't sit down long enough with me to find out what the actual problem is."  Patient reports she lives at home with her husband.  Patient reports her husband is well.  Patient reports her husband drives and takes her to her doctor's appointments.  Patient reports she has been falling.  [facility] asked patient if she has a walker at home? Patient reports she has a walker.  [facility] encouraged patient to use her walker until she is feeling better.  Patient verbalized understanding.  Patient reports she has a pain management doctor, "Somewhere around the [facility] area. I have been there once."  Patient reports she would like to get a pcp at [facility] since she already has a dermatologist there.  Patient also reports she will see her neurologist.  Patient  reports she gets her blood tested every two months for her coumadin levels.  Her coumadin is provided by Dr. Cyndie Chime.  Patient reports she may ask Dr. Cyndie Chime to her pcp.  EDCM discussed importance of having a pcp.  [facility] provided patient with a list of pcps who accept patient's insurance [employer] dual complete HMO/SNP within five miles of patient's zip code [address].  [facility] provided patient with phone number for [facility] to make an appointment for new patients [phone].  Patient reports she will be home by herself tomorrow and will make appointments for pcp and pain management.  Patient thankful for services.  No further EDCM needs at this time.

## 2014-06-16 NOTE — ED Notes (Addendum)
Per pt has arthritis and knees/ankles give out sometimes. Pt reports fall 9/12 related to this complaint. Pt reports hit frontal head; no injury noted; denies LOC. Pt reports generalized headache and generalized pain. Pt reports is here for pain medication until able to find PCP.

## 2014-06-16 NOTE — Telephone Encounter (Signed)
Returned pt call regarding request for pain medication.  Pt stated she needs a refill on her tradamol.  Darcella Cheshire, RN went to Dr. Jarvis Newcomer; he stated that patient has made it very clear several times that she was transferring her care to different practice and he was not going to refilled the medication.  Nurse reviewed previous telephone calls from pt regarding transferring care.  Pt was informed of the most recent phone call on 05/28/2014 she was going to a practice off [address] Rock Nephew stated she went there and did not like what they was doing.  She was in a lot pain and felt sick to her stomach.  Pt informed that the tramadol was not going to be refilled at this time and she should to go urgent care or emergency room for the pain. Pt stated that she had a doctor at internal medicine she was going to call; then stated she could not get an appt with them.  Pt advised she try and call that practice again for an appt if she is that much pain.  Clovis Pu, RN

## 2014-06-16 NOTE — Telephone Encounter (Signed)
Pt is in terrrible pain.  She needs her pain medication. Her pain is so bad she is sick on her stomach. She hasnt had any pain med in over a month. Said she didn't call before now because she tries to do with out it

## 2014-06-21 ENCOUNTER — Other Ambulatory Visit: Payer: Self-pay | Admitting: *Deleted

## 2014-06-22 ENCOUNTER — Telehealth: Payer: Self-pay | Admitting: *Deleted

## 2014-06-22 ENCOUNTER — Other Ambulatory Visit: Payer: PRIVATE HEALTH INSURANCE

## 2014-06-22 ENCOUNTER — Telehealth: Payer: Self-pay | Admitting: Pharmacist

## 2014-06-22 ENCOUNTER — Ambulatory Visit: Payer: PRIVATE HEALTH INSURANCE

## 2014-06-22 NOTE — Telephone Encounter (Signed)
Pt FTKA for lab/CC today. LVM and asked pt to reschedule. Please call [phone] to reschedule.

## 2014-06-22 NOTE — Telephone Encounter (Signed)
I tried to reach pt again at [phone].  No answer. I left message.

## 2014-06-22 NOTE — Telephone Encounter (Signed)
Returned pt's call - states she needs her pain medication(Tramadol) and blood pressure medication(Lasix) refilled until she [facility]. Requests were put in yesterday and denied per DR Cyndie ChimeGranfortuna; pt informed. Pt states she did not want to see any PCP here in [place].  I told her Dr Cyndie ChimeGranfortuna tried to call her - states her telephone line was down. She wants Dr Cyndie ChimeGranfortuna to call her directly; I told her I would give him her message.

## 2014-06-22 NOTE — Telephone Encounter (Signed)
Pt aware.

## 2014-06-25 ENCOUNTER — Encounter: Payer: Self-pay | Admitting: Oncology

## 2014-06-26 ENCOUNTER — Encounter (HOSPITAL_COMMUNITY): Payer: Self-pay | Admitting: Emergency Medicine

## 2014-06-26 ENCOUNTER — Emergency Department (HOSPITAL_COMMUNITY)
Admission: EM | Admit: 2014-06-26 | Discharge: 2014-06-26 | Disposition: A | Discharge status: Home / Self Care | Payer: PRIVATE HEALTH INSURANCE | Attending: Emergency Medicine | Admitting: Emergency Medicine

## 2014-06-26 ENCOUNTER — Emergency Department (HOSPITAL_COMMUNITY): Payer: PRIVATE HEALTH INSURANCE

## 2014-06-26 DIAGNOSIS — Z7952 Long term (current) use of systemic steroids: Secondary | ICD-10-CM | POA: Diagnosis not present

## 2014-06-26 DIAGNOSIS — Z7982 Long term (current) use of aspirin: Secondary | ICD-10-CM | POA: Insufficient documentation

## 2014-06-26 DIAGNOSIS — R05 Cough: Secondary | ICD-10-CM | POA: Diagnosis not present

## 2014-06-26 DIAGNOSIS — Z86718 Personal history of other venous thrombosis and embolism: Secondary | ICD-10-CM | POA: Insufficient documentation

## 2014-06-26 DIAGNOSIS — R011 Cardiac murmur, unspecified: Secondary | ICD-10-CM | POA: Insufficient documentation

## 2014-06-26 DIAGNOSIS — Z862 Personal history of diseases of the blood and blood-forming organs and certain disorders involving the immune mechanism: Secondary | ICD-10-CM | POA: Insufficient documentation

## 2014-06-26 DIAGNOSIS — F209 Schizophrenia, unspecified: Secondary | ICD-10-CM | POA: Insufficient documentation

## 2014-06-26 DIAGNOSIS — G4730 Sleep apnea, unspecified: Secondary | ICD-10-CM | POA: Insufficient documentation

## 2014-06-26 DIAGNOSIS — Z8701 Personal history of pneumonia (recurrent): Secondary | ICD-10-CM | POA: Insufficient documentation

## 2014-06-26 DIAGNOSIS — Z872 Personal history of diseases of the skin and subcutaneous tissue: Secondary | ICD-10-CM | POA: Diagnosis not present

## 2014-06-26 DIAGNOSIS — Z8639 Personal history of other endocrine, nutritional and metabolic disease: Secondary | ICD-10-CM | POA: Insufficient documentation

## 2014-06-26 DIAGNOSIS — Z7901 Long term (current) use of anticoagulants: Secondary | ICD-10-CM | POA: Insufficient documentation

## 2014-06-26 DIAGNOSIS — M1990 Unspecified osteoarthritis, unspecified site: Secondary | ICD-10-CM | POA: Diagnosis not present

## 2014-06-26 DIAGNOSIS — J45901 Unspecified asthma with (acute) exacerbation: Secondary | ICD-10-CM | POA: Diagnosis not present

## 2014-06-26 DIAGNOSIS — Z79899 Other long term (current) drug therapy: Secondary | ICD-10-CM | POA: Insufficient documentation

## 2014-06-26 DIAGNOSIS — G8929 Other chronic pain: Secondary | ICD-10-CM | POA: Insufficient documentation

## 2014-06-26 DIAGNOSIS — R059 Cough, unspecified: Secondary | ICD-10-CM

## 2014-06-26 DIAGNOSIS — K219 Gastro-esophageal reflux disease without esophagitis: Secondary | ICD-10-CM | POA: Diagnosis not present

## 2014-06-26 DIAGNOSIS — F329 Major depressive disorder, single episode, unspecified: Secondary | ICD-10-CM | POA: Insufficient documentation

## 2014-06-26 DIAGNOSIS — I10 Essential (primary) hypertension: Secondary | ICD-10-CM | POA: Insufficient documentation

## 2014-06-26 DIAGNOSIS — Z86711 Personal history of pulmonary embolism: Secondary | ICD-10-CM | POA: Insufficient documentation

## 2014-06-26 DIAGNOSIS — F419 Anxiety disorder, unspecified: Secondary | ICD-10-CM | POA: Insufficient documentation

## 2014-06-26 MED ORDER — AZITHROMYCIN 250 MG PO TABS: 250.0000 mg | ORAL_TABLET | Freq: Every day | ORAL | Status: DC

## 2014-06-26 MED ORDER — GUAIFENESIN-CODEINE 100-10 MG/5ML PO SOLN: 5.0000 mL | Freq: Three times a day (TID) | ORAL | Status: DC | PRN

## 2014-06-26 NOTE — Discharge Instructions (Signed)
Your xrays is normal   Please call your doctor for a followup appointment within 24-48 hours. When you talk to your doctor please let them know that you were seen in the emergency department and have them acquire all of your records so that they can discuss the findings with you and formulate a treatment plan to fully care for your new and ongoing problems.

## 2014-06-26 NOTE — ED Provider Notes (Signed)
CSN: [id number]     Arrival date & time 06/26/14  1854 History   First MD Initiated Contact with Patient 06/26/14 1904     Chief Complaint  Patient presents with  . Cough     (Consider location/radiation/quality/duration/timing/severity/associated sxs/prior Treatment) HPI Comments: 73 year old female, history of schizophrenia, hypertension, pulmonary embolism who presents with a complaint of a cough which started today. She reports that her family member, son, and similar symptoms and is taking an antibiotic at this time. She reports that the cough is persistent throughout the day, associated with a small amount of phlegm production, sneezing, nasal drainage but denies any lower extremity swelling or fevers. Nothing makes this better or worse  Patient is a 73 y.o. female presenting with cough. The history is provided by the patient and medical records.  Cough   Past Medical History  Diagnosis Date  . Depression   . Schizophrenia   . Hyperlipidemia   . Hypertension   . Chronic pain     "over my whole body" (03/30/2013)  . 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 DVT (deep venous thrombosis)     per 01/17/10 d/c summary- "remote hx of dvt"?  . Iron deficiency anemia   . Glaucoma   . Hemorrhoids   . Asthma   . Anxiety   . GERD (gastroesophageal reflux disease)   . Psoriasis 04/29/2012    New onset evaluated by Dr Marylou FlesherWilliam Huang, Dermatology, [facility] 6/13  Rx 0.1% Tacrolimus ointment  . Complication of anesthesia     "because I have sleep apnea" (03/30/2013)  . Heart murmur   . Chest pain, exertional   . Chronic bronchitis   . Sleep apnea     "waiting on my CPAP" (03/30/2013)  . Pneumonia     "a few times" (03/30/2013)  . Exertional shortness of breath     "& sometimes when laying down" (03/30/2013)  . Daily headache     "last 2 months" (03/30/2013)  . Arthritis     "joints" (03/30/2013)  . Varicose veins of legs   . Psoriasis    Past Surgical History   Procedure Laterality Date  . Spinal fusion      2004  . Carpal tunnel release Bilateral   . Tubal ligation  1973  . Dilation and curettage of uterus  1970's    "once" (03/30/2013)  . 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 Joint replacement    . Vein surgery  left leg   Family History  Problem Relation Age of Onset  . Diabetes Sister   . Colon cancer Brother 40  . Colon cancer Brother    History  Substance Use Topics  . Smoking status: Never Smoker   . Smokeless tobacco: Never Used  . Alcohol Use: No   OB History   Grav Para Term Preterm Abortions TAB SAB Ect Mult Living                 Review of Systems  Respiratory: Positive for cough.   All other systems reviewed and are negative.     Allergies  Other  Home Medications   Prior to Admission medications   Medication Sig Start Date End Date Taking? Authorizing Provider  acetaminophen (TYLENOL) 500 MG tablet Take 1,000 mg by mouth every 6 (six) hours as needed for mild pain.    Yes Historical Provider, MD  albuterol (PROVENTIL HFA;VENTOLIN HFA) 108 (90 BASE) MCG/ACT inhaler Inhale 2 puffs into  the lungs 2 (two) times daily as needed for wheezing or shortness of breath. 05/11/14  Yes Tyrone Nine, MD  atenolol (TENORMIN) 100 MG tablet Take 100 mg by mouth daily.   Yes Historical Provider, MD  esomeprazole (NEXIUM) 20 MG capsule Take 20 mg by mouth daily. Pt uses Nexium 24hour OTC product.   Yes Historical Provider, MD  FLUoxetine (PROZAC) 20 MG capsule Take 3 capsules (60 mg total) by mouth daily. 05/26/14  Yes Tyrone Nine, MD  fluticasone (FLOVENT HFA) 220 MCG/ACT inhaler Inhale 2 puffs into the lungs 2 (two) times daily. 05/06/14  Yes Antony Madura, PA-C  furosemide (LASIX) 20 MG tablet Take 20 mg by mouth daily.  04/25/14  Yes Historical Provider, MD  gabapentin (NEURONTIN) 300 MG capsule Take 1 capsule (300 mg total) by mouth 3 (three) times daily. 05/28/14  Yes Tyrone Nine, MD  hydrochlorothiazide (HYDRODIURIL) 25 MG  tablet Take 25 mg by mouth daily.   Yes Historical Provider, MD  HYDROcodone-acetaminophen (NORCO/VICODIN) 5-325 MG per tablet Take 1 tablet by mouth every 4 (four) hours as needed for severe pain. 06/16/14  Yes Audree Camel, MD  hydrOXYzine (ATARAX/VISTARIL) 10 MG tablet Take 10 mg by mouth at bedtime as needed for itching.   Yes Historical Provider, MD  lisinopril (PRINIVIL,ZESTRIL) 10 MG tablet Take 10 mg by mouth daily.  05/08/14  Yes Historical Provider, MD  montelukast (SINGULAIR) 10 MG tablet Take 1 tablet (10 mg total) by mouth at bedtime. 05/11/14  Yes Tyrone Nine, MD  silver sulfADIAZINE (SILVADENE) 1 % cream Apply 1 application topically 3 (three) times daily. 05/11/14  Yes Tyrone Nine, MD  sucralfate (CARAFATE) 1 G tablet Take 1 tablet (1 g total) by mouth 4 (four) times daily. 05/11/14  Yes Tyrone Nine, MD  tacrolimus (PROTOPIC) 0.1 % ointment Apply 1 application topically 3 (three) times daily as needed (for legs). Apply to affected areas daily PRN 03/01/14 03/01/15 Yes Historical Provider, MD  traMADol (ULTRAM) 50 MG tablet Take 50 mg by mouth every 6 (six) hours as needed for moderate pain.   Yes Historical Provider, MD  triamcinolone cream (KENALOG) 0.1 % Apply 1 application topically 2 (two) times daily as needed (for legs).    Yes Historical Provider, MD  warfarin (COUMADIN) 5 MG tablet Take 1 tablet (5 mg total) by mouth daily. 01/28/14  Yes Levert Feinstein, MD  azithromycin (ZITHROMAX Z-PAK) 250 MG tablet Take 1 tablet (250 mg total) by mouth daily. 500mg  PO day 1, then 250mg  PO days 205 06/26/14   Vida Roller, MD   BP 108/75  Pulse 71  Temp(Src) 99.5 F (37.5 C) (Oral)  Resp 18  SpO2 93%  LMP 11/22/1996 Physical Exam  Nursing note and vitals reviewed. Constitutional: She appears well-developed and well-nourished. No distress.  HENT:  Head: Normocephalic and atraumatic.  Mouth/Throat: Oropharynx is clear and moist. No oropharyngeal exudate.  Eyes: Conjunctivae and EOM  are normal. Pupils are equal, round, and reactive to light. Right eye exhibits no discharge. Left eye exhibits no discharge. No scleral icterus.  Neck: Normal range of motion. Neck supple. No JVD present. No thyromegaly present.  Cardiovascular: Normal rate, regular rhythm, normal heart sounds and intact distal pulses.  Exam reveals no gallop and no friction rub.   No murmur heard. Pulmonary/Chest: Effort normal and breath sounds normal. No respiratory distress. She has no wheezes. She has no rales.  Abdominal: Soft. Bowel sounds are normal. She exhibits no  distension and no mass. There is no tenderness.  Musculoskeletal: Normal range of motion. She exhibits no edema and no tenderness.  Lymphadenopathy:    She has no cervical adenopathy.  Neurological: She is alert. Coordination normal.  Skin: Skin is warm and dry. No rash noted. No erythema.  Psychiatric: She has a normal mood and affect. Her behavior is normal.    ED Course  Procedures (including critical care time) Labs Review Labs Reviewed - No data to display  Imaging Review Dg Chest 2 View  06/26/2014   CLINICAL DATA:  Cough, shortness of breath, headache, fever, nonsmoker  EXAM: CHEST  2 VIEW  COMPARISON:  04/23/2014  FINDINGS: Stable uncoiling of the aorta. Heart size upper normal but stable. Vascular pattern normal. Lungs clear. No effusions.  IMPRESSION: No active cardiopulmonary disease.   Electronically Signed   By: Esperanza Heir M.D.   On: 06/26/2014 19:54     MDM   Final diagnoses:  Cough    The patient has no signs of pharyngeal abnormalities, lungs are clear, she has a normal respiratory rate and is speaking in full sentences, occasional bouts of coughing, no peripheral edema and vital signs are unremarkable. Obtain chest x-ray to rule out pneumonia, likely respiratory illness, doubt congestive heart failure.  Xray neg, pt has normal VS, stable for d/c.  Meds given in ED:  Medications - No data to display  New  Prescriptions   AZITHROMYCIN (ZITHROMAX Z-PAK) 250 MG TABLET    Take 1 tablet (250 mg total) by mouth daily. 500mg  PO day 1, then 250mg  PO days 205        Vida Roller, MD 06/26/14 2026

## 2014-06-26 NOTE — ED Notes (Signed)
Per EMS: Pt from home.  C/o cough and headache since 0900.  Took some cough syrup, has not helped.  EMS thermometer said 101.5.  Son has pneumonia and thinks she has it now.

## 2014-07-01 ENCOUNTER — Ambulatory Visit: Payer: PRIVATE HEALTH INSURANCE | Admitting: Family Medicine

## 2014-07-01 ENCOUNTER — Encounter (HOSPITAL_COMMUNITY): Payer: Self-pay | Admitting: Emergency Medicine

## 2014-07-01 ENCOUNTER — Emergency Department (INDEPENDENT_AMBULATORY_CARE_PROVIDER_SITE_OTHER)
Admission: EM | Admit: 2014-07-01 | Discharge: 2014-07-01 | Disposition: A | Discharge status: Home / Self Care | Payer: PRIVATE HEALTH INSURANCE | Source: Home / Self Care

## 2014-07-01 DIAGNOSIS — R42 Dizziness and giddiness: Secondary | ICD-10-CM

## 2014-07-01 DIAGNOSIS — Z8709 Personal history of other diseases of the respiratory system: Secondary | ICD-10-CM

## 2014-07-01 MED ORDER — MECLIZINE HCL 25 MG PO TABS: ORAL_TABLET | ORAL | Status: DC

## 2014-07-01 NOTE — Discharge Instructions (Signed)
Use your Albuterol Inhaler every 4 hours for wheezing  Dizziness  Dizziness means you feel unsteady or lightheaded. You might feel like you are going to pass out (faint). HOME CARE   Drink enough fluids to keep your pee (urine) clear or pale yellow.  Take your medicines exactly as told by your doctor. If you take blood pressure medicine, always stand up slowly from the lying or sitting position. Hold on to something to steady yourself.  If you need to stand in one place for a long time, move your legs often. Tighten and relax your leg muscles.  Have someone stay with you until you feel okay.  Do not drive or use heavy machinery if you feel dizzy.  Do not drink alcohol. GET HELP RIGHT AWAY IF:   You feel dizzy or lightheaded and it gets worse.  You feel sick to your stomach (nauseous), or you throw up (vomit).  You have trouble talking or walking.  You feel weak or have trouble using your arms, hands, or legs.  You cannot think clearly or have trouble forming sentences.  You have chest pain, belly (abdominal) pain, sweating, or you are short of breath.  Your vision changes.  You are bleeding.  You have problems from your medicine that seem to be getting worse. MAKE SURE YOU:   Understand these instructions.  Will watch your condition.  Will get help right away if you are not doing well or get worse. Document Released: 08/30/2011 Document Revised: 12/03/2011 Document Reviewed: 08/30/2011 [facility] Patient Information 2015 [place], [place]. This information is not intended to replace advice given to you by your health care provider. Make sure you discuss any questions you have with your health care provider.  Vertigo Vertigo means you feel like you are moving when you are not. Vertigo can make you feel like things around you are moving when they are not. This problem often goes away on its own.  HOME CARE   Follow your doctor's instructions.  Avoid driving.  Avoid  using heavy machinery.  Avoid doing any activity that could be dangerous if you have a vertigo attack.  Tell your doctor if a medicine seems to cause your vertigo. GET HELP RIGHT AWAY IF:   Your medicines do not help or make you feel worse.  You have trouble talking or walking.  You feel weak or have trouble using your arms, hands, or legs.  You have bad headaches.  You keep feeling sick to your stomach (nauseous) or throwing up (vomiting).  Your vision changes.  A family member notices changes in your behavior.  Your problems get worse. MAKE SURE YOU:  Understand these instructions.  Will watch your condition.  Will get help right away if you are not doing well or get worse. Document Released: 06/19/2008 Document Revised: 12/03/2011 Document Reviewed: 03/29/2011 [facility] Patient Information 2015 [place]Exit[place], [place]. This information is not intended to replace advice given to you by your health care provider. Make sure you discuss any questions you have with your health care provider.

## 2014-07-01 NOTE — ED Provider Notes (Signed)
CSN: [id number]     Arrival date & time 07/01/14  1259 History   None    Chief Complaint  Patient presents with  . Dizziness   (Consider location/radiation/quality/duration/timing/severity/associated sxs/prior Treatment) HPI Comments: 73 year old female with a history of depression, schizophrenia, hyperlipidemia, hypertension, chronic pain, pulmonary embolism, DVT, anemia, anxiety and GERD is complaining of dizziness and vertigo. This is a chronic issue for her and has been for several years. Her recent episode started one to 2 months ago. She usually takes Antivert for this and is out of her medication. She is also asking for her chronic analgesics tramadol for her chronic daily headaches, pain all over. She was recently seen in the emergency department requesting refills of these medicines for same complaint as well as bronchitis.      Past Medical History  Diagnosis Date  . Depression   . Schizophrenia   . Hyperlipidemia   . Hypertension   . Chronic pain     "over my whole body" (03/30/2013)  . 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 DVT (deep venous thrombosis)     per 01/17/10 d/c summary- "remote hx of dvt"?  . Iron deficiency anemia   . Glaucoma   . Hemorrhoids   . Asthma   . Anxiety   . GERD (gastroesophageal reflux disease)   . Psoriasis 04/29/2012    New onset evaluated by Dr Marylou FlesherWilliam Huang[facility]  Rx 0.1% Tacrolimus ointment  . Complication of anesthesia     "because I have sleep apnea" (03/30/2013)  . Heart murmur   . Chest pain, exertional   . Chronic bronchitis   . Sleep apnea     "waiting on my CPAP" (03/30/2013)  . Pneumonia     "a few times" (03/30/2013)  . Exertional shortness of breath     "& sometimes when laying down" (03/30/2013)  . Daily headache     "last 2 months" (03/30/2013)  . Arthritis     "joints" (03/30/2013)  . Varicose veins of legs   . Psoriasis    Past Surgical History  Procedure Laterality Date  .  Spinal fusion      2004  . Carpal tunnel release Bilateral   . Tubal ligation  1973  . Dilation and curettage of uterus  1970's    "once" (03/30/2013)  . 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 Joint replacement    . Vein surgery  left leg   Family History  Problem Relation Age of Onset  . Diabetes Sister   . Colon cancer Brother 40  . Colon cancer Brother    History  Substance Use Topics  . Smoking status: Never Smoker   . Smokeless tobacco: Never Used  . Alcohol Use: No   OB History   Grav Para Term Preterm Abortions TAB SAB Ect Mult Living                 Review of Systems  Constitutional: Positive for activity change and fatigue. Negative for fever.  HENT: Negative for congestion, ear pain and sore throat.   Respiratory: Positive for cough. Negative for stridor.   Cardiovascular: Positive for leg swelling. Negative for chest pain.  Gastrointestinal: Negative.   Genitourinary: Negative for dysuria and pelvic pain.  Musculoskeletal: Positive for back pain, myalgias and neck pain.  Skin:       Moderate to severe bilateral LE phlebitis, superficial and PVD.  Neurological: Positive for dizziness and headaches.  Negative for syncope and speech difficulty.    Allergies  Other  Home Medications   Prior to Admission medications   Medication Sig Start Date End Date Taking? Authorizing Provider  acetaminophen (TYLENOL) 500 MG tablet Take 1,000 mg by mouth every 6 (six) hours as needed for mild pain.     Historical Provider, MD  albuterol (PROVENTIL HFA;VENTOLIN HFA) 108 (90 BASE) MCG/ACT inhaler Inhale 2 puffs into the lungs 2 (two) times daily as needed for wheezing or shortness of breath. 05/11/14   Tyrone Nine, MD  atenolol (TENORMIN) 100 MG tablet Take 100 mg by mouth daily.    Historical Provider, MD  azithromycin (ZITHROMAX Z-PAK) 250 MG tablet Take 1 tablet (250 mg total) by mouth daily. 500mg  PO day 1, then 250mg  PO days 205 06/26/14   Vida Roller, MD  esomeprazole  (NEXIUM) 20 MG capsule Take 20 mg by mouth daily. Pt uses Nexium 24hour OTC product.    Historical Provider, MD  FLUoxetine (PROZAC) 20 MG capsule Take 3 capsules (60 mg total) by mouth daily. 05/26/14   Tyrone Nine, MD  fluticasone (FLOVENT HFA) 220 MCG/ACT inhaler Inhale 2 puffs into the lungs 2 (two) times daily. 05/06/14   Antony Madura, PA-C  furosemide (LASIX) 20 MG tablet Take 20 mg by mouth daily.  04/25/14   Historical Provider, MD  gabapentin (NEURONTIN) 300 MG capsule Take 1 capsule (300 mg total) by mouth 3 (three) times daily. 05/28/14   Tyrone Nine, MD  guaiFENesin-codeine 100-10 MG/5ML syrup Take 5 mLs by mouth 3 (three) times daily as needed for cough. 06/26/14   Vida Roller, MD  hydrochlorothiazide (HYDRODIURIL) 25 MG tablet Take 25 mg by mouth daily.    Historical Provider, MD  HYDROcodone-acetaminophen (NORCO/VICODIN) 5-325 MG per tablet Take 1 tablet by mouth every 4 (four) hours as needed for severe pain. 06/16/14   Audree Camel, MD  hydrOXYzine (ATARAX/VISTARIL) 10 MG tablet Take 10 mg by mouth at bedtime as needed for itching.    Historical Provider, MD  lisinopril (PRINIVIL,ZESTRIL) 10 MG tablet Take 10 mg by mouth daily.  05/08/14   Historical Provider, MD  meclizine (ANTIVERT) 25 MG tablet Take 1/2 tablet every 8 hours for dizziness 07/01/14   Hayden Rasmussen, NP  montelukast (SINGULAIR) 10 MG tablet Take 1 tablet (10 mg total) by mouth at bedtime. 05/11/14   Tyrone Nine, MD  silver sulfADIAZINE (SILVADENE) 1 % cream Apply 1 application topically 3 (three) times daily. 05/11/14   Tyrone Nine, MD  sucralfate (CARAFATE) 1 G tablet Take 1 tablet (1 g total) by mouth 4 (four) times daily. 05/11/14   Tyrone Nine, MD  tacrolimus (PROTOPIC) 0.1 % ointment Apply 1 application topically 3 (three) times daily as needed (for legs). Apply to affected areas daily PRN 03/01/14 03/01/15  Historical Provider, MD  traMADol (ULTRAM) 50 MG tablet Take 50 mg by mouth every 6 (six) hours as needed for  moderate pain.    Historical Provider, MD  triamcinolone cream (KENALOG) 0.1 % Apply 1 application topically 2 (two) times daily as needed (for legs).     Historical Provider, MD  warfarin (COUMADIN) 5 MG tablet Take 1 tablet (5 mg total) by mouth daily. 01/28/14   Levert Feinstein, MD   BP 154/92  Pulse 62  Temp(Src) 98.4 F (36.9 C) (Oral)  Resp 16  SpO2 96%  LMP 11/22/1996 Physical Exam  Nursing note and vitals reviewed. Constitutional: She is oriented  to person, place, and time. She appears well-developed and well-nourished. No distress.  HENT:  Mouth/Throat: Oropharynx is clear and moist. No oropharyngeal exudate.  Eyes: Conjunctivae and EOM are normal. Pupils are equal, round, and reactive to light.  Neck: Normal range of motion. Neck supple.  Cardiovascular: Normal rate and normal heart sounds.   Pulmonary/Chest: She has no rales.  Bilateral coarseness on early expiration.   Musculoskeletal: She exhibits edema.  Neurological: She is alert and oriented to person, place, and time. She exhibits normal muscle tone.  Skin: Skin is warm and dry.  Psychiatric: Her behavior is normal.    ED Course  Procedures (including critical care time) Labs Review Labs Reviewed - No data to display  Imaging Review No results found.   MDM   1. Dizziness   2. Vertigo   3. History of acute bronchitis with bronchospasm     antivert Continue your meds Use your albuterol q 4h for congestion See your PCP soon for f/u and refill on chronic pain meds    Hayden Rasmussen, NP 07/01/14 1422

## 2014-07-01 NOTE — ED Provider Notes (Signed)
Medical screening examination/treatment/procedure(s) were performed by non-physician practitioner and as supervising physician I was immediately available for consultation/collaboration.  Leslee Homeavid Chaise Passarella, M.D.  Reuben Likesavid C Eliana Lueth, MD 07/01/14 [phone]

## 2014-07-01 NOTE — ED Notes (Signed)
Pt is here today because she has been experiencing vertigo for the past 2 months, pt is also requesting a refill of tramadol for her headaches

## 2014-07-05 ENCOUNTER — Emergency Department (HOSPITAL_COMMUNITY)
Admission: EM | Admit: 2014-07-05 | Discharge: 2014-07-05 | Disposition: A | Discharge status: Home / Self Care | Payer: PRIVATE HEALTH INSURANCE | Attending: Emergency Medicine | Admitting: Emergency Medicine

## 2014-07-05 ENCOUNTER — Encounter (HOSPITAL_COMMUNITY): Payer: Self-pay | Admitting: Emergency Medicine

## 2014-07-05 ENCOUNTER — Emergency Department (HOSPITAL_COMMUNITY): Payer: PRIVATE HEALTH INSURANCE

## 2014-07-05 DIAGNOSIS — Z86711 Personal history of pulmonary embolism: Secondary | ICD-10-CM | POA: Insufficient documentation

## 2014-07-05 DIAGNOSIS — R011 Cardiac murmur, unspecified: Secondary | ICD-10-CM | POA: Diagnosis not present

## 2014-07-05 DIAGNOSIS — F329 Major depressive disorder, single episode, unspecified: Secondary | ICD-10-CM | POA: Diagnosis not present

## 2014-07-05 DIAGNOSIS — Z792 Long term (current) use of antibiotics: Secondary | ICD-10-CM | POA: Diagnosis not present

## 2014-07-05 DIAGNOSIS — H409 Unspecified glaucoma: Secondary | ICD-10-CM | POA: Diagnosis not present

## 2014-07-05 DIAGNOSIS — R059 Cough, unspecified: Secondary | ICD-10-CM

## 2014-07-05 DIAGNOSIS — Z872 Personal history of diseases of the skin and subcutaneous tissue: Secondary | ICD-10-CM | POA: Diagnosis not present

## 2014-07-05 DIAGNOSIS — Z8701 Personal history of pneumonia (recurrent): Secondary | ICD-10-CM | POA: Diagnosis not present

## 2014-07-05 DIAGNOSIS — M1990 Unspecified osteoarthritis, unspecified site: Secondary | ICD-10-CM | POA: Diagnosis not present

## 2014-07-05 DIAGNOSIS — Z79899 Other long term (current) drug therapy: Secondary | ICD-10-CM | POA: Insufficient documentation

## 2014-07-05 DIAGNOSIS — K219 Gastro-esophageal reflux disease without esophagitis: Secondary | ICD-10-CM | POA: Diagnosis not present

## 2014-07-05 DIAGNOSIS — M940 Chondrocostal junction syndrome [Tietze]: Secondary | ICD-10-CM | POA: Insufficient documentation

## 2014-07-05 DIAGNOSIS — Z7951 Long term (current) use of inhaled steroids: Secondary | ICD-10-CM | POA: Insufficient documentation

## 2014-07-05 DIAGNOSIS — R05 Cough: Secondary | ICD-10-CM

## 2014-07-05 DIAGNOSIS — Z8639 Personal history of other endocrine, nutritional and metabolic disease: Secondary | ICD-10-CM | POA: Diagnosis not present

## 2014-07-05 DIAGNOSIS — I10 Essential (primary) hypertension: Secondary | ICD-10-CM | POA: Diagnosis not present

## 2014-07-05 DIAGNOSIS — Z7901 Long term (current) use of anticoagulants: Secondary | ICD-10-CM | POA: Insufficient documentation

## 2014-07-05 DIAGNOSIS — F209 Schizophrenia, unspecified: Secondary | ICD-10-CM | POA: Diagnosis not present

## 2014-07-05 DIAGNOSIS — R079 Chest pain, unspecified: Secondary | ICD-10-CM | POA: Diagnosis present

## 2014-07-05 DIAGNOSIS — J45901 Unspecified asthma with (acute) exacerbation: Secondary | ICD-10-CM | POA: Insufficient documentation

## 2014-07-05 MED ORDER — TRAMADOL HCL 50 MG PO TABS: 50.0000 mg | ORAL_TABLET | Freq: Four times a day (QID) | ORAL | Status: DC | PRN

## 2014-07-05 MED ORDER — TRAMADOL HCL 50 MG PO TABS
50.0000 mg | ORAL_TABLET | Freq: Once | ORAL | Status: AC
  Administered 2014-07-05: 50 mg via ORAL
  Filled 2014-07-05: qty 1

## 2014-07-05 NOTE — Discharge Instructions (Signed)
Costochondritis Costochondritis, sometimes called Tietze syndrome, is a swelling and irritation (inflammation) of the tissue (cartilage) that connects your ribs with your breastbone (sternum). It causes pain in the chest and rib area. Costochondritis usually goes away on its own over time. It can take up to 6 weeks or longer to get better, especially if you are unable to limit your activities. CAUSES  Some cases of costochondritis have no known cause. Possible causes include:  Injury (trauma).  Exercise or activity such as lifting.  Severe coughing. SIGNS AND SYMPTOMS  Pain and tenderness in the chest and rib area.  Pain that gets worse when coughing or taking deep breaths.  Pain that gets worse with specific movements. DIAGNOSIS  Your health care provider will do a physical exam and ask about your symptoms. Chest X-rays or other tests may be done to rule out other problems. TREATMENT  Costochondritis usually goes away on its own over time. Your health care provider may prescribe medicine to help relieve pain. HOME CARE INSTRUCTIONS   Avoid exhausting physical activity. Try not to strain your ribs during normal activity. This would include any activities using chest, abdominal, and side muscles, especially if heavy weights are used.  Apply ice to the affected area for the first 2 days after the pain begins.  Put ice in a plastic bag.  Place a towel between your skin and the bag.  Leave the ice on for 20 minutes, 2-3 times a day.  Only take over-the-counter or prescription medicines as directed by your health care provider. SEEK MEDICAL CARE IF:  You have redness or swelling at the rib joints. These are signs of infection.  Your pain does not go away despite rest or medicine. SEEK IMMEDIATE MEDICAL CARE IF:   Your pain increases or you are very uncomfortable.  You have shortness of breath or difficulty breathing.  You cough up blood.  You have worse chest pains,  sweating, or vomiting.  You have a fever or persistent symptoms for more than 2-3 days.  You have a fever and your symptoms suddenly get worse. MAKE SURE YOU:   Understand these instructions.  Will watch your condition.  Will get help right away if you are not doing well or get worse. Document Released: 06/20/2005 Document Revised: 07/01/2013 Document Reviewed: 04/14/2013 ExitCare Patient Information 2015 [employer]. This information is not intended to replace advice given to you by your health care provider. Make sure you discuss any questions you have with your health care provider.  

## 2014-07-05 NOTE — ED Provider Notes (Signed)
CSN: [id number]     Arrival date & time 07/05/14  0058 History   First MD Initiated Contact with Patient 07/05/14 0110     Chief Complaint  Patient presents with  . Rib cage pain     (Consider location/radiation/quality/duration/timing/severity/associated sxs/prior Treatment) HPI Comments: 73 year old female with a history of depression, schizophrenia, hypertension, PE on chronic coumadin, chronic bronchitis, and chronic pain presents to the emergency department for pain to her right lower anterior chest wall. Patient states that she has been coughing frequently over the last week. She states that she has been noticing an aching discomfort in her chest with coughing. Patient also endorses tenderness on palpation to her right chest. She states that she has been out of her tramadol and over-the-counter medications are not relieving her symptoms. Patient denies following up with her primary care provider since diagnosed with bronchitis one week ago. She further denies associated fever, syncope, shortness of breath, hemoptysis, and nausea or vomiting. This is the patient's 18th visit in the last 6 months.  The history is provided by the patient. No language interpreter was used.    Past Medical History  Diagnosis Date  . Depression   . Schizophrenia   . Hyperlipidemia   . Hypertension   . Chronic pain     "over my whole body" (03/30/2013)  . 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 DVT (deep venous thrombosis)     per 01/17/10 d/c summary- "remote hx of dvt"?  . Iron deficiency anemia   . Glaucoma   . Hemorrhoids   . Asthma   . Anxiety   . GERD (gastroesophageal reflux disease)   . Psoriasis 04/29/2012    New onset evaluated by Dr Marylou FlesherWilliam Huang, [facility]  Rx 0.1% Tacrolimus ointment  . Complication of anesthesia     "because I have sleep apnea" (03/30/2013)  . Heart murmur   . Chest pain, exertional   . Chronic bronchitis   . Sleep apnea      "waiting on my CPAP" (03/30/2013)  . Pneumonia     "a few times" (03/30/2013)  . Exertional shortness of breath     "& sometimes when laying down" (03/30/2013)  . Daily headache     "last 2 months" (03/30/2013)  . Arthritis     "joints" (03/30/2013)  . Varicose veins of legs   . Psoriasis    Past Surgical History  Procedure Laterality Date  . Spinal fusion      2004  . Carpal tunnel release Bilateral   . Tubal ligation  1973  . Dilation and curettage of uterus  1970's    "once" (03/30/2013)  . 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 Joint replacement    . Vein surgery  left leg   Family History  Problem Relation Age of Onset  . Diabetes Sister   . Colon cancer Brother 40  . Colon cancer Brother    History  Substance Use Topics  . Smoking status: Never Smoker   . Smokeless tobacco: Never Used  . Alcohol Use: No   OB History   Grav Para Term Preterm Abortions TAB SAB Ect Mult Living                  Review of Systems  Constitutional: Negative for fever.  Respiratory: Positive for cough. Negative for shortness of breath.   Cardiovascular: Positive for chest pain (R anterior chest wall).  Gastrointestinal: Negative for nausea  and vomiting.  Neurological: Negative for syncope and weakness.  All other systems reviewed and are negative.   Allergies  Other  Home Medications   Prior to Admission medications   Medication Sig Start Date End Date Taking? Authorizing Provider  acetaminophen (TYLENOL) 500 MG tablet Take 1,000 mg by mouth every 6 (six) hours as needed for mild pain.     Historical Provider, MD  albuterol (PROVENTIL HFA;VENTOLIN HFA) 108 (90 BASE) MCG/ACT inhaler Inhale 2 puffs into the lungs 2 (two) times daily as needed for wheezing or shortness of breath. 05/11/14   Tyrone Nineyan B Grunz, MD  atenolol (TENORMIN) 100 MG tablet Take 100 mg by mouth daily.    Historical Provider, MD  azithromycin (ZITHROMAX Z-PAK) 250 MG tablet Take 1 tablet (250 mg total) by mouth daily.  500mg  PO day 1, then 250mg  PO days 205 06/26/14   Vida RollerBrian D Miller, MD  esomeprazole (NEXIUM) 20 MG capsule Take 20 mg by mouth daily. Pt uses Nexium 24hour OTC product.    Historical Provider, MD  FLUoxetine (PROZAC) 20 MG capsule Take 3 capsules (60 mg total) by mouth daily. 05/26/14   Tyrone Nineyan B Grunz, MD  fluticasone (FLOVENT HFA) 220 MCG/ACT inhaler Inhale 2 puffs into the lungs 2 (two) times daily. 05/06/14   Antony MaduraKelly Mattis Featherly, PA-C  furosemide (LASIX) 20 MG tablet Take 20 mg by mouth daily.  04/25/14   Historical Provider, MD  gabapentin (NEURONTIN) 300 MG capsule Take 1 capsule (300 mg total) by mouth 3 (three) times daily. 05/28/14   Tyrone Nineyan B Grunz, MD  guaiFENesin-codeine 100-10 MG/5ML syrup Take 5 mLs by mouth 3 (three) times daily as needed for cough. 06/26/14   Vida RollerBrian D Miller, MD  hydrochlorothiazide (HYDRODIURIL) 25 MG tablet Take 25 mg by mouth daily.    Historical Provider, MD  HYDROcodone-acetaminophen (NORCO/VICODIN) 5-325 MG per tablet Take 1 tablet by mouth every 4 (four) hours as needed for severe pain. 06/16/14   Audree CamelScott T Goldston, MD  hydrOXYzine (ATARAX/VISTARIL) 10 MG tablet Take 10 mg by mouth at bedtime as needed for itching.    Historical Provider, MD  lisinopril (PRINIVIL,ZESTRIL) 10 MG tablet Take 10 mg by mouth daily.  05/08/14   Historical Provider, MD  meclizine (ANTIVERT) 25 MG tablet Take 1/2 tablet every 8 hours for dizziness 07/01/14   Hayden Rasmussenavid Mabe, NP  montelukast (SINGULAIR) 10 MG tablet Take 1 tablet (10 mg total) by mouth at bedtime. 05/11/14   Tyrone Nineyan B Grunz, MD  silver sulfADIAZINE (SILVADENE) 1 % cream Apply 1 application topically 3 (three) times daily. 05/11/14   Tyrone Nineyan B Grunz, MD  sucralfate (CARAFATE) 1 G tablet Take 1 tablet (1 g total) by mouth 4 (four) times daily. 05/11/14   Tyrone Nineyan B Grunz, MD  tacrolimus (PROTOPIC) 0.1 % ointment Apply 1 application topically 3 (three) times daily as needed (for legs). Apply to affected areas daily PRN 03/01/14 03/01/15  Historical Provider, MD  traMADol  (ULTRAM) 50 MG tablet Take 1 tablet (50 mg total) by mouth every 6 (six) hours as needed for moderate pain. 07/05/14   Antony MaduraKelly Martha Ellerby, PA-C  triamcinolone cream (KENALOG) 0.1 % Apply 1 application topically 2 (two) times daily as needed (for legs).     Historical Provider, MD  warfarin (COUMADIN) 5 MG tablet Take 1 tablet (5 mg total) by mouth daily. 01/28/14   Levert FeinsteinJames M Granfortuna, MD   BP 144/60  Pulse 75  Temp(Src) 98.2 F (36.8 C) (Oral)  Resp 18  SpO2 97%  LMP  11/22/1996  Physical Exam  Nursing note and vitals reviewed. Constitutional: She is oriented to person, place, and time. She appears well-developed and well-nourished. No distress.  Nontoxic/nonseptic appearing  HENT:  Head: Normocephalic and atraumatic.  Mouth/Throat: Oropharynx is clear and moist. No oropharyngeal exudate.  Eyes: Conjunctivae and EOM are normal. No scleral icterus.  Neck: Normal range of motion.  Cardiovascular: Normal rate, regular rhythm and intact distal pulses.   Pulmonary/Chest: Effort normal. No respiratory distress. She has wheezes. She has no rales. She exhibits tenderness.  Mild expiratory wheezing in right lower lung field. No accessory muscle use, tachypnea or dyspnea. Patient has tenderness to palpation to her right anterior, inferior chest wall. No deformity or crepitus. Chest expansion symmetric.  Musculoskeletal: Normal range of motion.  Neurological: She is alert and oriented to person, place, and time. She exhibits normal muscle tone. Coordination normal.  GCS 15. Patient speaking in full goal oriented sentences. She moves extremities without ataxia.  Skin: Skin is warm and dry. No rash noted. She is not diaphoretic. No erythema. No pallor.  Psychiatric: She has a normal mood and affect. Her behavior is normal.    ED Course  Procedures (including critical care time) Labs Review Labs Reviewed - No data to display  Imaging Review Dg Ribs Unilateral W/chest Right  07/05/2014   CLINICAL  DATA:  Chest pain. Right lower anterior rib pain. Initial encounter  EXAM: RIGHT RIBS AND CHEST - 3+ VIEW  COMPARISON:  06/26/2014  FINDINGS: No fracture or other bone lesions are seen involving the ribs. There is no evidence of pneumothorax or pleural effusion. Both lungs are clear. Mild cardiomegaly which is stable from previous. Mild aortic tortuosity which is also unchanged. There is chronic fullness of the right hilum, vascular based on chest CT 04/23/2014.  IMPRESSION: Negative.   Electronically Signed   By: Tiburcio Pea M.D.   On: 07/05/2014 01:56     EKG Interpretation None      MDM   Final diagnoses:  Costochondritis  Cough    73 year old female, well-known to the emergency department, presents to the emergency department for chest wall pain exacerbated with deep breathing and coughing. Tenderness reproducible on palpation to the right anterior, inferior chest wall. Patient diagnosed with acute bronchitis 1 week ago. Symptoms consistent with costochondritis. Imaging today negative for fracture, dislocation, focal consolidation, pneumonia, or pneumothorax. No pleural effusion. Cardiomegaly is stable compared to prior imaging.  Patient has been out of her tramadol for the past week. Suspect that her visit to the emergency department is in attempt to obtain additional pain medication. I stressed to the patient does need to followup with her primary care doctor for refill of her chronic prescriptions. Will prescribe short course of tramadol for the patient on discharge. Return precautions discussed and provided. Patient agreeable to plan with no unaddressed concerns. Patient ambulated out of the ED in good condition; VSS.   Filed Vitals:   07/05/14 0103 07/05/14 0248  BP: 144/60 128/53  Pulse: 75 60  Temp: 98.2 F (36.8 C)   TempSrc: Oral   Resp: 18 18  SpO2: 97% 94%     Antony Madura, PA-C 07/05/14 [phone]

## 2014-07-05 NOTE — ED Notes (Addendum)
Pt presents from home via EMS with c/o right rib cage pain. EMS initially called for flank pain but pt points to the ER right around her right rib cage, believes it to be from coughing as she was recently diagnosed with bronchitis.

## 2014-07-05 NOTE — ED Notes (Signed)
Bed: [id number] Expected date: 07/05/14 Expected time: 12:52 AM Means of arrival: Ambulance Comments: EMS flank pain

## 2014-07-06 ENCOUNTER — Other Ambulatory Visit: Payer: Self-pay | Admitting: *Deleted

## 2014-07-06 ENCOUNTER — Encounter: Payer: Self-pay | Admitting: Family Medicine

## 2014-07-06 ENCOUNTER — Ambulatory Visit (INDEPENDENT_AMBULATORY_CARE_PROVIDER_SITE_OTHER): Payer: PRIVATE HEALTH INSURANCE | Admitting: Family Medicine

## 2014-07-06 VITALS — BP 170/109 | HR 74 | Temp 98.6°F | Ht 65.0 in | Wt 252.8 lb

## 2014-07-06 DIAGNOSIS — Z91199 Patient's noncompliance with other medical treatment and regimen due to unspecified reason: Secondary | ICD-10-CM

## 2014-07-06 DIAGNOSIS — R42 Dizziness and giddiness: Secondary | ICD-10-CM

## 2014-07-06 DIAGNOSIS — Z79899 Other long term (current) drug therapy: Secondary | ICD-10-CM

## 2014-07-06 DIAGNOSIS — Z9119 Patient's noncompliance with other medical treatment and regimen: Secondary | ICD-10-CM

## 2014-07-06 MED ORDER — MECLIZINE HCL 25 MG PO TABS: 25.0000 mg | ORAL_TABLET | Freq: Three times a day (TID) | ORAL | Status: DC | PRN

## 2014-07-06 NOTE — Progress Notes (Signed)
   Subjective:    Patient ID: [name] [name], female    DOB: 13-Aug-1941, 73 y.o.   MRN: [id number]  HPI 73 year old female with complex past medical history presents for a same day appointment for urgent care followup and med reconciliation.  1) Urgent Care follow up/Med reconciliation  Patient was seen at urgent care several times recently (10/3, 10/8, 10/12).  She has been battling cough/costochondritis as well as dizziness/vertigo.  She states that she is now improving following cough medicine, antibiotics, as well as PRN Meclizine for her Vertigo.  No other complaints currently, but states that she is often confused about her medications (what drugs are for what, which ones she should be taking, etc).   Review of Systems Per HPI    Objective:   Physical Exam Filed Vitals:   07/06/14 0940  BP: 170/109  Pulse: 74  Temp: 98.6 F (37 C)   Exam: General: well appearing elderly female, NAD. Psych: normal mood and affect.      Assessment & Plan:  73 year old female with a complex PMH presents for follow up.  1) Dizziness/Vertigo - Improving with Meclizine. - Will Continue  2) Med reconciliation/Continuity of care issues - I informed patient that I would take a look at her medications today but that this was not appropriate for a same day appt (I reviewed her BP medications for which she had questions). - Patient informed me that she is unclear of where she wants to get care.  She states that she has seen numerous doctors and has been going to the ED and Urgent care too much.  She reports that she and Dr. Bonner Puna do not get along but cannot explain to me why. - I urged her to continue to follow up with her PCP if she wants to continue getting her care at [facility].  Patient inquired about pain medication and I declined. - Patient also met with Jeanett Schlein (RN) regarding her lack of continuity.  I am send a message to [facility] case manager in hopes that we can address these issues.

## 2014-07-06 NOTE — Patient Instructions (Signed)
It was nice to see you today.  Please take the medications as prescribed.  The HCTZ, Atenolol, and Lisinopril are for blood pressure.  Please continue to take the meclizine as needed.   Take care,  Everlene OtherJayce Daeron Carreno DO

## 2014-07-06 NOTE — Telephone Encounter (Signed)
Pt was seen in ed on 07-05-14.  She was given a rx for tramadol but only for a few days.  She would like a new rx written for this.  This something that she is regularly on.  Made a follow up appt for her to see you on 07/26/14.  She would also like to know if she can get a muscle relaxer.  Jazmin Hartsell,CMA

## 2014-07-07 NOTE — ED Provider Notes (Signed)
Medical screening examination/treatment/procedure(s) were performed by non-physician practitioner and as supervising physician I was immediately available for consultation/collaboration.   Rustin Erhart T Kasen Adduci, MD 07/07/14 1616 

## 2014-07-08 ENCOUNTER — Telehealth: Payer: Self-pay | Admitting: Family Medicine

## 2014-07-08 NOTE — Telephone Encounter (Signed)
Pt called and would [facility] her service at [facility]. jw

## 2014-07-08 NOTE — Telephone Encounter (Signed)
Returned call to patient and left message to call our office back.  ~Jeannette Richardson, BSN, RN-BC  

## 2014-07-09 ENCOUNTER — Other Ambulatory Visit: Payer: Self-pay | Admitting: *Deleted

## 2014-07-09 NOTE — Telephone Encounter (Signed)
Pt is returning Jeannette's call. Please call back . jw

## 2014-07-09 NOTE — Telephone Encounter (Signed)
Patient calling because Dr. Jarvis NewcomerGrunz will not refill her pain med (Tramadol).  Went to ED on 07/05/14 and received Tramadol #15 tabs.  Is out of med and requesting enough med to last until her follow-up appt here on 07/26/14.  Will route request to Dr. Jarvis NewcomerGrunz and call patient back.  Altamese Dilling~Jeannette Richardson, BSN, RN-BC

## 2014-07-12 ENCOUNTER — Other Ambulatory Visit: Payer: Self-pay | Admitting: *Deleted

## 2014-07-12 MED ORDER — FUROSEMIDE 20 MG PO TABS: 20.0000 mg | ORAL_TABLET | Freq: Every day | ORAL | Status: DC

## 2014-07-12 MED ORDER — FLUTICASONE PROPIONATE HFA 220 MCG/ACT IN AERO: 2.0000 | INHALATION_SPRAY | Freq: Two times a day (BID) | RESPIRATORY_TRACT | Status: DC

## 2014-07-12 NOTE — Telephone Encounter (Signed)
2nd request for Flovent HFA inhaler. Clovis PuMartin, Tamika L, RN

## 2014-07-12 NOTE — Telephone Encounter (Addendum)
Spoke with Dr. Loletta ParishGrunz--Will not be able to refill med without an office visit.  Has appt on 07/26/14 and no earlier appts available.  Called patient to relay message.  Unable to complete call--patient having trouble with phone and unable to hear me.  Will try to call patient back or wait for patient to call our office.  Altamese Dilling~Jeannette Richardson, BSN, RN-BC

## 2014-07-12 NOTE — Telephone Encounter (Signed)
It would be poor care to prescribe anything at this time given the many no-shows and question of changing practices. If she is staying here, I'll be happy to see her at my soonest available appointment.

## 2014-07-14 ENCOUNTER — Telehealth: Payer: Self-pay | Admitting: Clinical

## 2014-07-14 NOTE — Telephone Encounter (Signed)
Received a call from pt requesting to [facility] about her challenges regarding getting her medication. Pt states she is out of medications and is unable to get an appointment until next month. Pt states she was told she cant see another provider and her provider currently does not have any open slots.  CSW informed pt that CSW would request for [facility].  Theresia BoughNorma Oletha Tolson, MSW, LCSW [phone]

## 2014-07-15 ENCOUNTER — Ambulatory Visit: Payer: Medicare Other | Admitting: Podiatrist

## 2014-07-16 ENCOUNTER — Emergency Department (HOSPITAL_COMMUNITY)
Admission: EM | Admit: 2014-07-16 | Discharge: 2014-07-16 | Disposition: A | Discharge status: Home / Self Care | Payer: PRIVATE HEALTH INSURANCE | Attending: Emergency Medicine | Admitting: Emergency Medicine

## 2014-07-16 ENCOUNTER — Encounter (HOSPITAL_COMMUNITY): Payer: Self-pay | Admitting: Emergency Medicine

## 2014-07-16 DIAGNOSIS — Z7951 Long term (current) use of inhaled steroids: Secondary | ICD-10-CM | POA: Insufficient documentation

## 2014-07-16 DIAGNOSIS — G8929 Other chronic pain: Secondary | ICD-10-CM | POA: Insufficient documentation

## 2014-07-16 DIAGNOSIS — M79662 Pain in left lower leg: Secondary | ICD-10-CM | POA: Insufficient documentation

## 2014-07-16 DIAGNOSIS — Z86718 Personal history of other venous thrombosis and embolism: Secondary | ICD-10-CM | POA: Diagnosis not present

## 2014-07-16 DIAGNOSIS — Z86711 Personal history of pulmonary embolism: Secondary | ICD-10-CM | POA: Insufficient documentation

## 2014-07-16 DIAGNOSIS — Z872 Personal history of diseases of the skin and subcutaneous tissue: Secondary | ICD-10-CM | POA: Diagnosis not present

## 2014-07-16 DIAGNOSIS — M545 Low back pain: Secondary | ICD-10-CM | POA: Diagnosis not present

## 2014-07-16 DIAGNOSIS — Z981 Arthrodesis status: Secondary | ICD-10-CM | POA: Diagnosis not present

## 2014-07-16 DIAGNOSIS — I10 Essential (primary) hypertension: Secondary | ICD-10-CM | POA: Insufficient documentation

## 2014-07-16 DIAGNOSIS — Z7901 Long term (current) use of anticoagulants: Secondary | ICD-10-CM | POA: Insufficient documentation

## 2014-07-16 DIAGNOSIS — K219 Gastro-esophageal reflux disease without esophagitis: Secondary | ICD-10-CM | POA: Diagnosis not present

## 2014-07-16 DIAGNOSIS — J45909 Unspecified asthma, uncomplicated: Secondary | ICD-10-CM | POA: Diagnosis not present

## 2014-07-16 DIAGNOSIS — M79661 Pain in right lower leg: Secondary | ICD-10-CM | POA: Diagnosis not present

## 2014-07-16 DIAGNOSIS — Z8639 Personal history of other endocrine, nutritional and metabolic disease: Secondary | ICD-10-CM | POA: Diagnosis not present

## 2014-07-16 DIAGNOSIS — F419 Anxiety disorder, unspecified: Secondary | ICD-10-CM | POA: Insufficient documentation

## 2014-07-16 DIAGNOSIS — Z79899 Other long term (current) drug therapy: Secondary | ICD-10-CM | POA: Insufficient documentation

## 2014-07-16 DIAGNOSIS — R011 Cardiac murmur, unspecified: Secondary | ICD-10-CM | POA: Diagnosis not present

## 2014-07-16 DIAGNOSIS — M1990 Unspecified osteoarthritis, unspecified site: Secondary | ICD-10-CM | POA: Insufficient documentation

## 2014-07-16 DIAGNOSIS — Z8701 Personal history of pneumonia (recurrent): Secondary | ICD-10-CM | POA: Diagnosis not present

## 2014-07-16 DIAGNOSIS — M549 Dorsalgia, unspecified: Secondary | ICD-10-CM | POA: Diagnosis present

## 2014-07-16 DIAGNOSIS — F329 Major depressive disorder, single episode, unspecified: Secondary | ICD-10-CM | POA: Diagnosis not present

## 2014-07-16 DIAGNOSIS — Z862 Personal history of diseases of the blood and blood-forming organs and certain disorders involving the immune mechanism: Secondary | ICD-10-CM | POA: Diagnosis not present

## 2014-07-16 MED ORDER — HYDROCODONE-ACETAMINOPHEN 5-325 MG PO TABS
1.0000 | ORAL_TABLET | Freq: Once | ORAL | Status: AC
  Administered 2014-07-16: 1 via ORAL
  Filled 2014-07-16: qty 1

## 2014-07-16 MED ORDER — TRAMADOL HCL 50 MG PO TABS: 50.0000 mg | ORAL_TABLET | Freq: Four times a day (QID) | ORAL | Status: DC | PRN

## 2014-07-16 NOTE — Discharge Instructions (Signed)
Continue ultram. Follow up with your doctor.   Back Exercises Back exercises help treat and prevent back injuries. The goal of back exercises is to increase the strength of your abdominal and back muscles and the flexibility of your back. These exercises should be started when you no longer have back pain. Back exercises include:  Pelvic Tilt. Lie on your back with your knees bent. Tilt your pelvis until the lower part of your back is against the floor. Hold this position 5 to 10 sec and repeat 5 to 10 times.  Knee to Chest. Pull first 1 knee up against your chest and hold for 20 to 30 seconds, repeat this with the other knee, and then both knees. This may be done with the other leg straight or bent, whichever feels better.  Sit-Ups or Curl-Ups. Bend your knees 90 degrees. Start with tilting your pelvis, and do a partial, slow sit-up, lifting your trunk only 30 to 45 degrees off the floor. Take at least 2 to 3 seconds for each sit-up. Do not do sit-ups with your knees out straight. If partial sit-ups are difficult, simply do the above but with only tightening your abdominal muscles and holding it as directed.  Hip-Lift. Lie on your back with your knees flexed 90 degrees. Push down with your feet and shoulders as you raise your hips a couple inches off the floor; hold for 10 seconds, repeat 5 to 10 times.  Back arches. Lie on your stomach, propping yourself up on bent elbows. Slowly press on your hands, causing an arch in your low back. Repeat 3 to 5 times. Any initial stiffness and discomfort should lessen with repetition over time.  Shoulder-Lifts. Lie face down with arms beside your body. Keep hips and torso pressed to floor as you slowly lift your head and shoulders off the floor. Do not overdo your exercises, especially in the beginning. Exercises may cause you some mild back discomfort which lasts for a few minutes; however, if the pain is more severe, or lasts for more than 15 minutes, do not  continue exercises until you see your caregiver. Improvement with exercise therapy for back problems is slow.  See your caregivers for assistance with developing a proper back exercise program. Document Released: 10/18/2004 Document Revised: 12/03/2011 Document Reviewed: 07/12/2011 [facility] Patient Information 2015 [employer]. This information is not intended to replace advice given to you by your health care provider. Make sure you discuss any questions you have with your health care provider.

## 2014-07-16 NOTE — ED Provider Notes (Signed)
CSN: [id number]     Arrival date & time 07/16/14  1524 History   First MD Initiated Contact with Patient 07/16/14 1630     Chief Complaint  Patient presents with  . Back Pain  . Leg Pain     (Consider location/radiation/quality/duration/timing/severity/associated sxs/prior Treatment) HPI [name] [name] is a 73 y.o. female who presents to ED with complaint of chronic back and leg pain. Pt states pain has been there "for years." Pt states she is here just to get some pain medications. Patient states she went to see a primary care Dr., she missed an appointment last month, but was able to get in with another physician [facility]. The physician who saw her help her with her blood pressure medications and maintenance medications but refused to provide her with her pain medications. States she wasn't able to get an appointment until November 2 to see her Dr. Patient states pain is chronic. She used to receive injections in her back, she has not had them in several months. She states she usually gets treated with tramadol. She states she needs something to last her until she is able to see her Dr. Patient denies any fever or chills. She denies any new numbness or weakness in extremities. No loss of bowels or bladder control. No new pain. No knee injuries. She has been taking over-the-counter medications with no relief.  Past Medical History  Diagnosis Date  . Depression   . Schizophrenia   . Hyperlipidemia   . Hypertension   . Chronic pain     "over my whole body" (03/30/2013)  . 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 DVT (deep venous thrombosis)     per 01/17/10 d/c summary- "remote hx of dvt"?  . Iron deficiency anemia   . Glaucoma   . Hemorrhoids   . Asthma   . Anxiety   . GERD (gastroesophageal reflux disease)   . Psoriasis 04/29/2012    New onset evaluated by Dr Marylou Flesher, Dermatology, [facility] 6/13  Rx 0.1% Tacrolimus ointment  . Complication  of anesthesia     "because I have sleep apnea" (03/30/2013)  . Heart murmur   . Chest pain, exertional   . Chronic bronchitis   . Sleep apnea     "waiting on my CPAP" (03/30/2013)  . Pneumonia     "a few times" (03/30/2013)  . Exertional shortness of breath     "& sometimes when laying down" (03/30/2013)  . Daily headache     "last 2 months" (03/30/2013)  . Arthritis     "joints" (03/30/2013)  . Varicose veins of legs   . Psoriasis    Past Surgical History  Procedure Laterality Date  . Spinal fusion      2004  . Carpal tunnel release Bilateral   . Tubal ligation  1973  . Dilation and curettage of uterus  1970's    "once" (03/30/2013)  . 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 Joint replacement    . Vein surgery  left leg   Family History  Problem Relation Age of Onset  . Diabetes Sister   . Colon cancer Brother 40  . Colon cancer Brother    History  Substance Use Topics  . Smoking status: Never Smoker   . Smokeless tobacco: Never Used  . Alcohol Use: No   OB History   Grav Para Term Preterm Abortions TAB SAB Ect Mult Living  Review of Systems  Constitutional: Negative for fever and chills.  Respiratory: Negative for cough, chest tightness and shortness of breath.   Cardiovascular: Negative for chest pain, palpitations and leg swelling.  Gastrointestinal: Negative for nausea, vomiting, abdominal pain and diarrhea.  Genitourinary: Negative for dysuria and flank pain.  Musculoskeletal: Positive for arthralgias and back pain. Negative for myalgias, neck pain and neck stiffness.  Skin: Negative for rash.  Neurological: Negative for dizziness, weakness and headaches.  All other systems reviewed and are negative.     Allergies  Other  Home Medications   Prior to Admission medications   Medication Sig Start Date End Date Taking? Authorizing Provider  acetaminophen (TYLENOL) 500 MG tablet Take 1,000 mg by mouth every 6 (six) hours as needed for mild pain  (pain).    Yes Historical Provider, MD  albuterol (PROVENTIL HFA;VENTOLIN HFA) 108 (90 BASE) MCG/ACT inhaler Inhale 2 puffs into the lungs every 6 (six) hours as needed for wheezing or shortness of breath (wheezing).   Yes Historical Provider, MD  atenolol (TENORMIN) 100 MG tablet Take 100 mg by mouth daily.   Yes Historical Provider, MD  esomeprazole (NEXIUM) 20 MG capsule Take 20 mg by mouth daily. Pt uses Nexium 24hour OTC product.   Yes Historical Provider, MD  FLUoxetine (PROZAC) 20 MG capsule Take 3 capsules (60 mg total) by mouth daily. 05/26/14  Yes Tyrone Nineyan B Grunz, MD  fluticasone (FLOVENT HFA) 220 MCG/ACT inhaler Inhale 2 puffs into the lungs 2 (two) times daily. 07/12/14  Yes Tyrone Nineyan B Grunz, MD  gabapentin (NEURONTIN) 300 MG capsule Take 1 capsule (300 mg total) by mouth 3 (three) times daily. 05/28/14  Yes Tyrone Nineyan B Grunz, MD  furosemide (LASIX) 20 MG tablet Take 1 tablet (20 mg total) by mouth daily. 07/12/14   Tyrone Nineyan B Grunz, MD  guaiFENesin-codeine 100-10 MG/5ML syrup Take 5 mLs by mouth 3 (three) times daily as needed for cough. 06/26/14   Vida RollerBrian D Miller, MD  hydrochlorothiazide (HYDRODIURIL) 25 MG tablet Take 25 mg by mouth daily.    Historical Provider, MD  HYDROcodone-acetaminophen (NORCO/VICODIN) 5-325 MG per tablet Take 1 tablet by mouth every 4 (four) hours as needed for severe pain. 06/16/14   Audree CamelScott T Goldston, MD  hydrOXYzine (ATARAX/VISTARIL) 10 MG tablet Take 10 mg by mouth at bedtime as needed for itching.    Historical Provider, MD  lisinopril (PRINIVIL,ZESTRIL) 10 MG tablet Take 10 mg by mouth daily.  05/08/14   Historical Provider, MD  meclizine (ANTIVERT) 25 MG tablet Take 1 tablet (25 mg total) by mouth 3 (three) times daily as needed for dizziness (And Vertigo.). 07/06/14   Tommie SamsJayce G Cook, DO  montelukast (SINGULAIR) 10 MG tablet Take 1 tablet (10 mg total) by mouth at bedtime. 05/11/14   Tyrone Nineyan B Grunz, MD  silver sulfADIAZINE (SILVADENE) 1 % cream Apply 1 application topically 3 (three)  times daily. 05/11/14   Tyrone Nineyan B Grunz, MD  sucralfate (CARAFATE) 1 G tablet Take 1 tablet (1 g total) by mouth 4 (four) times daily. 05/11/14   Tyrone Nineyan B Grunz, MD  tacrolimus (PROTOPIC) 0.1 % ointment Apply 1 application topically 3 (three) times daily as needed (for legs). Apply to affected areas daily PRN 03/01/14 03/01/15  Historical Provider, MD  traMADol (ULTRAM) 50 MG tablet Take 1 tablet (50 mg total) by mouth every 6 (six) hours as needed for moderate pain. 07/05/14   Antony MaduraKelly Humes, PA-C  triamcinolone cream (KENALOG) 0.1 % Apply 1 application topically 2 (two) times  daily as needed (for legs).     Historical Provider, MD  warfarin (COUMADIN) 5 MG tablet Take 1 tablet (5 mg total) by mouth daily. 01/28/14   Levert FeinsteinJames M Granfortuna, MD   BP 140/73  Pulse 80  Temp(Src) 97.4 F (36.3 C) (Oral)  Resp 16  SpO2 94%  LMP 11/22/1996 Physical Exam  Nursing note and vitals reviewed. Constitutional: She appears well-developed and well-nourished. No distress.  HENT:  Head: Normocephalic.  Eyes: Conjunctivae are normal.  Neck: Neck supple.  Cardiovascular: Normal rate, regular rhythm and normal heart sounds.   Pulmonary/Chest: Effort normal and breath sounds normal. No respiratory distress. She has no wheezes. She has no rales.  Abdominal: Soft. Bowel sounds are normal. She exhibits no distension. There is no tenderness.  Musculoskeletal: She exhibits no edema.  Healed prior midline surgical incision. Tender to palpation over midline lumbar spine. Bilateral paravertebral tenderness over paraspinal muscles. Pain with bilateral straight leg raise. There is a chronic left lower leg skin discoloration consistent with vascular disease. DP pulses intact and equal bilaterally.  Neurological: She is alert.  5/5 and equal lower extremity strength. 2+ and equal patellar reflexes bilaterally. Pt able to dorsiflex bilateral toes and feet with good strength against resistance. Equal sensation bilaterally over thighs and  lower legs.   Skin: Skin is warm and dry.  Psychiatric: She has a normal mood and affect. Her behavior is normal.    ED Course  Procedures (including critical care time) Labs Review Labs Reviewed - No data to display  Imaging Review No results found.   EKG Interpretation None      MDM   Final diagnoses:  Chronic back pain   Patient is here with chronic back pain, no new injuries, no fever chills. No bowel or bladder incontinence. She is neurovascularly intact. She's requesting refill of her pain medications. Her next appointment with her Dr. is in 10 days. Will refill ultram for few days. She is encouraged to follow up for further pain management.   Filed Vitals:   07/16/14 1554  BP: 140/73  Pulse: 80  Temp: 97.4 F (36.3 C)  TempSrc: Oral  Resp: 16  SpO2: 94%     Jonhatan Hearty A Parish Augustine, PA-C 07/17/14 0100

## 2014-07-16 NOTE — ED Notes (Signed)
Pt from home c/o lower back and bilateral leg pain that is chronic and nature. Pt sts that she [facility] but was unable to get appt. Pt sts that she is here for pain meds because she has run out of her rx's. Pt adds that she has hx of back fusion that this is not new pain and denies fall or injury. Pt is A&O and in NAD

## 2014-07-16 NOTE — ED Notes (Signed)
Patient states she missed a couple of appointments with her PCP and states "He is mad at me and won't prescribe me anymore pain medication." I was hoping I would get a shot today instead of pain pills.

## 2014-07-18 ENCOUNTER — Emergency Department (HOSPITAL_COMMUNITY)
Admission: EM | Admit: 2014-07-18 | Discharge: 2014-07-18 | Disposition: A | Discharge status: Home / Self Care | Payer: PRIVATE HEALTH INSURANCE | Attending: Emergency Medicine | Admitting: Emergency Medicine

## 2014-07-18 ENCOUNTER — Encounter (HOSPITAL_COMMUNITY): Payer: Self-pay | Admitting: Emergency Medicine

## 2014-07-18 DIAGNOSIS — Z79899 Other long term (current) drug therapy: Secondary | ICD-10-CM | POA: Diagnosis not present

## 2014-07-18 DIAGNOSIS — M1990 Unspecified osteoarthritis, unspecified site: Secondary | ICD-10-CM | POA: Insufficient documentation

## 2014-07-18 DIAGNOSIS — M545 Low back pain: Secondary | ICD-10-CM | POA: Insufficient documentation

## 2014-07-18 DIAGNOSIS — F329 Major depressive disorder, single episode, unspecified: Secondary | ICD-10-CM | POA: Insufficient documentation

## 2014-07-18 DIAGNOSIS — Z872 Personal history of diseases of the skin and subcutaneous tissue: Secondary | ICD-10-CM | POA: Diagnosis not present

## 2014-07-18 DIAGNOSIS — H409 Unspecified glaucoma: Secondary | ICD-10-CM | POA: Insufficient documentation

## 2014-07-18 DIAGNOSIS — Z7951 Long term (current) use of inhaled steroids: Secondary | ICD-10-CM | POA: Insufficient documentation

## 2014-07-18 DIAGNOSIS — Z86711 Personal history of pulmonary embolism: Secondary | ICD-10-CM | POA: Insufficient documentation

## 2014-07-18 DIAGNOSIS — G8929 Other chronic pain: Secondary | ICD-10-CM | POA: Diagnosis not present

## 2014-07-18 DIAGNOSIS — K219 Gastro-esophageal reflux disease without esophagitis: Secondary | ICD-10-CM | POA: Diagnosis not present

## 2014-07-18 DIAGNOSIS — Z7952 Long term (current) use of systemic steroids: Secondary | ICD-10-CM | POA: Diagnosis not present

## 2014-07-18 DIAGNOSIS — I10 Essential (primary) hypertension: Secondary | ICD-10-CM | POA: Diagnosis not present

## 2014-07-18 DIAGNOSIS — J45909 Unspecified asthma, uncomplicated: Secondary | ICD-10-CM | POA: Insufficient documentation

## 2014-07-18 DIAGNOSIS — Z7901 Long term (current) use of anticoagulants: Secondary | ICD-10-CM | POA: Insufficient documentation

## 2014-07-18 DIAGNOSIS — Z86718 Personal history of other venous thrombosis and embolism: Secondary | ICD-10-CM | POA: Diagnosis not present

## 2014-07-18 DIAGNOSIS — F419 Anxiety disorder, unspecified: Secondary | ICD-10-CM | POA: Insufficient documentation

## 2014-07-18 DIAGNOSIS — Z862 Personal history of diseases of the blood and blood-forming organs and certain disorders involving the immune mechanism: Secondary | ICD-10-CM | POA: Diagnosis not present

## 2014-07-18 DIAGNOSIS — M549 Dorsalgia, unspecified: Secondary | ICD-10-CM

## 2014-07-18 DIAGNOSIS — F209 Schizophrenia, unspecified: Secondary | ICD-10-CM | POA: Insufficient documentation

## 2014-07-18 MED ORDER — CYCLOBENZAPRINE HCL 10 MG PO TABS: 10.0000 mg | ORAL_TABLET | Freq: Two times a day (BID) | ORAL | Status: DC | PRN

## 2014-07-18 MED ORDER — CYCLOBENZAPRINE HCL 10 MG PO TABS
10.0000 mg | ORAL_TABLET | Freq: Once | ORAL | Status: AC
  Administered 2014-07-18: 10 mg via ORAL
  Filled 2014-07-18: qty 1

## 2014-07-18 NOTE — ED Provider Notes (Signed)
Medical screening examination/treatment/procedure(s) were performed by non-physician practitioner and as supervising physician I was immediately available for consultation/collaboration.   EKG Interpretation None        Raiford Fetterman T Yaakov Saindon, MD 07/18/14 2335 

## 2014-07-18 NOTE — Discharge Instructions (Signed)
Back Pain, Adult Low back pain is very common. About 1 in 5 people have back pain.The cause of low back pain is rarely dangerous. The pain often gets better over time.About half of people with a sudden onset of back pain feel better in just 2 weeks. About 8 in 10 people feel better by 6 weeks.  CAUSES Some common causes of back pain include:  Strain of the muscles or ligaments supporting the spine.  Wear and tear (degeneration) of the spinal discs.  Arthritis.  Direct injury to the back. DIAGNOSIS Most of the time, the direct cause of low back pain is not known.However, back pain can be treated effectively even when the exact cause of the pain is unknown.Answering your caregiver's questions about your overall health and symptoms is one of the most accurate ways to make sure the cause of your pain is not dangerous. If your caregiver needs more information, he or she may order lab work or imaging tests (X-rays or MRIs).However, even if imaging tests show changes in your back, this usually does not require surgery. HOME CARE INSTRUCTIONS For many people, back pain returns.Since low back pain is rarely dangerous, it is often a condition that people can learn to manageon their own.   Remain active. It is stressful on the back to sit or stand in one place. Do not sit, drive, or stand in one place for more than 30 minutes at a time. Take short walks on level surfaces as soon as pain allows.Try to increase the length of time you walk each day.  Do not stay in bed.Resting more than 1 or 2 days can delay your recovery.  Do not avoid exercise or work.Your body is made to move.It is not dangerous to be active, even though your back may hurt.Your back will likely heal faster if you return to being active before your pain is gone.  Pay attention to your body when you bend and lift. Many people have less discomfortwhen lifting if they bend their knees, keep the load close to their bodies,and  avoid twisting. Often, the most comfortable positions are those that put less stress on your recovering back.  Find a comfortable position to sleep. Use a firm mattress and lie on your side with your knees slightly bent. If you lie on your back, put a pillow under your knees.  Only take over-the-counter or prescription medicines as directed by your caregiver. Over-the-counter medicines to reduce pain and inflammation are often the most helpful.Your caregiver may prescribe muscle relaxant drugs.These medicines help dull your pain so you can more quickly return to your normal activities and healthy exercise.  Put ice on the injured area.  Put ice in a plastic bag.  Place a towel between your skin and the bag.  Leave the ice on for 15-20 minutes, 03-04 times a day for the first 2 to 3 days. After that, ice and heat may be alternated to reduce pain and spasms.  Ask your caregiver about trying back exercises and gentle massage. This may be of some benefit.  Avoid feeling anxious or stressed.Stress increases muscle tension and can worsen back pain.It is important to recognize when you are anxious or stressed and learn ways to manage it.Exercise is a great option. SEEK MEDICAL CARE IF:  You have pain that is not relieved with rest or medicine.  You have pain that does not improve in 1 week.  You have new symptoms.  You are generally not feeling well. SEEK   IMMEDIATE MEDICAL CARE IF:   You have pain that radiates from your back into your legs.  You develop new bowel or bladder control problems.  You have unusual weakness or numbness in your arms or legs.  You develop nausea or vomiting.  You develop abdominal pain.  You feel faint. Document Released: 09/10/2005 Document Revised: 03/11/2012 Document Reviewed: 01/12/2014 [employer] Patient Information 2015 [employer]. This information is not intended to replace advice given to you by your health care provider. Make sure you  discuss any questions you have with your health care provider.  

## 2014-07-18 NOTE — ED Notes (Signed)
Pt [facility], c/o continued lower back pain that has gotten worse. Pt reports difficulty walking and trouble getting out of bed this morning, but was able to ambulate from house to stretcher with steady gait.

## 2014-07-18 NOTE — ED Provider Notes (Signed)
CSN: [id number]     Arrival date & time 07/18/14  1711 History   First MD Initiated Contact with Patient 07/18/14 1821     Chief Complaint  Patient presents with  . Back Pain     (Consider location/radiation/quality/duration/timing/severity/associated sxs/prior Treatment) HPI 73 year old female presents with an acute exacerbation of her chronic low back pain. She states her pain was so bad this morning she was having difficulty getting out of bed. There is no focal weakness and no new numbness. She is seen in the ED frequently for back pain similar to this. She was given tramadol 2 days ago but states she wasn't given any muscle relaxants. Denies fevers, chills, vomiting, abdominal pain, or bowel or bladder incontinence. The patient states this feels like her chronic back pain but much worse. She feels stiff. Of note, patient was asleep when I first went up to evaluate her.  Past Medical History  Diagnosis Date  . Depression   . Schizophrenia   . Hyperlipidemia   . Hypertension   . Chronic pain     "over my whole body" (03/30/2013)  . 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 DVT (deep venous thrombosis)     per 01/17/10 d/c summary- "remote hx of dvt"?  . Iron deficiency anemia   . Glaucoma   . Hemorrhoids   . Asthma   . Anxiety   . GERD (gastroesophageal reflux disease)   . Psoriasis 04/29/2012    New onset evaluated by Dr Marylou FlesherWilliam Huang, Dermatology, [facility] 6/[facility]  Rx 0.1% Tacrolimus ointment  . Complication of anesthesia     "because I have sleep apnea" (03/30/2013)  . Heart murmur   . Chest pain, exertional   . Chronic bronchitis   . Sleep apnea     "waiting on my CPAP" (03/30/2013)  . Pneumonia     "a few times" (03/30/2013)  . Exertional shortness of breath     "& sometimes when laying down" (03/30/2013)  . Daily headache     "last 2 months" (03/30/2013)  . Arthritis     "joints" (03/30/2013)  . Varicose veins of legs   . Psoriasis    Past Surgical  History  Procedure Laterality Date  . Spinal fusion      2004  . Carpal tunnel release Bilateral   . Tubal ligation  1973  . Dilation and curettage of uterus  1970's    "once" (03/30/2013)  . 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 Joint replacement    . Vein surgery  left leg   Family History  Problem Relation Age of Onset  . Diabetes Sister   . Colon cancer Brother 40  . Colon cancer Brother    History  Substance Use Topics  . Smoking status: Never Smoker   . Smokeless tobacco: Never Used  . Alcohol Use: No   OB History   Grav Para Term Preterm Abortions TAB SAB Ect Mult Living                 Review of Systems  Constitutional: Negative for fever.  Gastrointestinal: Negative for vomiting and abdominal pain.  Genitourinary: Negative for dysuria.       No bowel or bladder incontinence  Musculoskeletal: Positive for back pain. Negative for neck pain.  Neurological: Negative for weakness and numbness.  All other systems reviewed and are negative.     Allergies  Other  Home Medications   Prior to Admission medications  Medication Sig Start Date End Date Taking? Authorizing Provider  acetaminophen (TYLENOL) 500 MG tablet Take 1,000 mg by mouth every 6 (six) hours as needed for mild pain (pain).     Historical Provider, MD  albuterol (PROVENTIL HFA;VENTOLIN HFA) 108 (90 BASE) MCG/ACT inhaler Inhale 2 puffs into the lungs every 6 (six) hours as needed for wheezing or shortness of breath (wheezing).    Historical Provider, MD  atenolol (TENORMIN) 100 MG tablet Take 100 mg by mouth daily.    Historical Provider, MD  cyclobenzaprine (FLEXERIL) 10 MG tablet Take 1 tablet (10 mg total) by mouth 2 (two) times daily as needed for muscle spasms. 07/18/14   Audree Camel, MD  esomeprazole (NEXIUM) 20 MG capsule Take 20 mg by mouth daily. Pt uses Nexium 24hour OTC product.    Historical Provider, MD  FLUoxetine (PROZAC) 20 MG capsule Take 3 capsules (60 mg total) by mouth  daily. 05/26/14   Tyrone Nine, MD  fluticasone (FLOVENT HFA) 220 MCG/ACT inhaler Inhale 2 puffs into the lungs 2 (two) times daily. 07/12/14   Tyrone Nine, MD  furosemide (LASIX) 20 MG tablet Take 1 tablet (20 mg total) by mouth daily. 07/12/14   Tyrone Nine, MD  gabapentin (NEURONTIN) 300 MG capsule Take 1 capsule (300 mg total) by mouth 3 (three) times daily. 05/28/14   Tyrone Nine, MD  hydrochlorothiazide (HYDRODIURIL) 25 MG tablet Take 25 mg by mouth daily.    Historical Provider, MD  hydrOXYzine (ATARAX/VISTARIL) 10 MG tablet Take 10 mg by mouth at bedtime as needed for itching (itching).     Historical Provider, MD  lisinopril (PRINIVIL,ZESTRIL) 10 MG tablet Take 10 mg by mouth daily.  05/08/14   Historical Provider, MD  meclizine (ANTIVERT) 25 MG tablet Take 12.5 mg by mouth 3 (three) times daily as needed for dizziness (dizziness).    Historical Provider, MD  montelukast (SINGULAIR) 10 MG tablet Take 1 tablet (10 mg total) by mouth at bedtime. 05/11/14   Tyrone Nine, MD  silver sulfADIAZINE (SILVADENE) 1 % cream Apply 1 application topically 3 (three) times daily. 05/11/14   Tyrone Nine, MD  sucralfate (CARAFATE) 1 G tablet Take 1 tablet (1 g total) by mouth 4 (four) times daily. 05/11/14   Tyrone Nine, MD  tacrolimus (PROTOPIC) 0.1 % ointment Apply 1 application topically 3 (three) times daily as needed (for legs). Apply to affected areas daily PRN 03/01/14 03/01/15  Historical Provider, MD  traMADol (ULTRAM) 50 MG tablet Take 1 tablet (50 mg total) by mouth every 6 (six) hours as needed. 07/16/14   Tatyana A Kirichenko, PA-C  triamcinolone cream (KENALOG) 0.1 % Apply 1 application topically 2 (two) times daily as needed (for legs).     Historical Provider, MD  warfarin (COUMADIN) 5 MG tablet Take 1 tablet (5 mg total) by mouth daily. 01/28/14   Levert Feinstein, MD   BP 111/54  Pulse 67  Temp(Src) 98.9 F (37.2 C) (Oral)  Resp 20  SpO2 94%  LMP 11/22/1996 Physical Exam  Nursing note and  vitals reviewed. Constitutional: She is oriented to person, place, and time. She appears well-developed and well-nourished.  HENT:  Head: Normocephalic and atraumatic.  Right Ear: External ear normal.  Left Ear: External ear normal.  Nose: Nose normal.  Eyes: Right eye exhibits no discharge. Left eye exhibits no discharge.  Cardiovascular: Normal rate, regular rhythm and normal heart sounds.   Pulmonary/Chest: Effort normal and breath sounds normal.  Abdominal: Soft. She exhibits no distension. There is no tenderness.  Musculoskeletal:       Lumbar back: She exhibits tenderness.  Neurological: She is alert and oriented to person, place, and time. She displays no Babinski's sign on the right side. She displays no Babinski's sign on the left side.  Reflex Scores:      Patellar reflexes are 2+ on the right side and 2+ on the left side.      Achilles reflexes are 2+ on the right side and 2+ on the left side. Normal strength and sensation in bilateral lower extremities. Normal unassisted gait.  Skin: Skin is warm and dry.    ED Course  Procedures (including critical care time) Labs Review Labs Reviewed - No data to display  Imaging Review No results found.   EKG Interpretation None      MDM   Final diagnoses:  Chronic back pain    Patient with acute on chronic low back pain. Patient is in the ER frequently for this. No new red flags such as fever, neurologic dysfunction, abdominal pain, or other concerning findings to suggest needing imaging at this time. Patient is able to ambulate on her own. She received 20 tramadol's 2 days ago and I do not feel she needs further narcotics. At this point will treat her with muscle relaxants and recommend follow-up with her PCP for establishment of longer term pain control.    Audree CamelScott T Cay Kath, MD 07/18/14 [phone]

## 2014-07-24 ENCOUNTER — Emergency Department (HOSPITAL_COMMUNITY)
Admission: EM | Admit: 2014-07-24 | Discharge: 2014-07-24 | Disposition: A | Discharge status: Home / Self Care | Payer: PRIVATE HEALTH INSURANCE | Attending: Emergency Medicine | Admitting: Emergency Medicine

## 2014-07-24 ENCOUNTER — Encounter (HOSPITAL_COMMUNITY): Payer: Self-pay | Admitting: Emergency Medicine

## 2014-07-24 DIAGNOSIS — M159 Polyosteoarthritis, unspecified: Secondary | ICD-10-CM | POA: Diagnosis not present

## 2014-07-24 DIAGNOSIS — R011 Cardiac murmur, unspecified: Secondary | ICD-10-CM | POA: Insufficient documentation

## 2014-07-24 DIAGNOSIS — F419 Anxiety disorder, unspecified: Secondary | ICD-10-CM | POA: Diagnosis not present

## 2014-07-24 DIAGNOSIS — I878 Other specified disorders of veins: Secondary | ICD-10-CM | POA: Insufficient documentation

## 2014-07-24 DIAGNOSIS — Z8709 Personal history of other diseases of the respiratory system: Secondary | ICD-10-CM | POA: Diagnosis not present

## 2014-07-24 DIAGNOSIS — I10 Essential (primary) hypertension: Secondary | ICD-10-CM | POA: Insufficient documentation

## 2014-07-24 DIAGNOSIS — Z9981 Dependence on supplemental oxygen: Secondary | ICD-10-CM | POA: Insufficient documentation

## 2014-07-24 DIAGNOSIS — R195 Other fecal abnormalities: Secondary | ICD-10-CM | POA: Insufficient documentation

## 2014-07-24 DIAGNOSIS — D689 Coagulation defect, unspecified: Secondary | ICD-10-CM | POA: Insufficient documentation

## 2014-07-24 DIAGNOSIS — K625 Hemorrhage of anus and rectum: Secondary | ICD-10-CM | POA: Diagnosis present

## 2014-07-24 DIAGNOSIS — H409 Unspecified glaucoma: Secondary | ICD-10-CM | POA: Insufficient documentation

## 2014-07-24 DIAGNOSIS — Z86718 Personal history of other venous thrombosis and embolism: Secondary | ICD-10-CM | POA: Diagnosis not present

## 2014-07-24 DIAGNOSIS — Z8701 Personal history of pneumonia (recurrent): Secondary | ICD-10-CM | POA: Diagnosis not present

## 2014-07-24 DIAGNOSIS — Z86711 Personal history of pulmonary embolism: Secondary | ICD-10-CM | POA: Insufficient documentation

## 2014-07-24 DIAGNOSIS — G4730 Sleep apnea, unspecified: Secondary | ICD-10-CM | POA: Insufficient documentation

## 2014-07-24 DIAGNOSIS — Z7901 Long term (current) use of anticoagulants: Secondary | ICD-10-CM | POA: Diagnosis not present

## 2014-07-24 DIAGNOSIS — F329 Major depressive disorder, single episode, unspecified: Secondary | ICD-10-CM | POA: Insufficient documentation

## 2014-07-24 DIAGNOSIS — J45901 Unspecified asthma with (acute) exacerbation: Secondary | ICD-10-CM | POA: Diagnosis not present

## 2014-07-24 DIAGNOSIS — Z872 Personal history of diseases of the skin and subcutaneous tissue: Secondary | ICD-10-CM | POA: Insufficient documentation

## 2014-07-24 DIAGNOSIS — G8929 Other chronic pain: Secondary | ICD-10-CM | POA: Diagnosis not present

## 2014-07-24 DIAGNOSIS — K219 Gastro-esophageal reflux disease without esophagitis: Secondary | ICD-10-CM | POA: Insufficient documentation

## 2014-07-24 DIAGNOSIS — K644 Residual hemorrhoidal skin tags: Secondary | ICD-10-CM | POA: Insufficient documentation

## 2014-07-24 DIAGNOSIS — Z79899 Other long term (current) drug therapy: Secondary | ICD-10-CM | POA: Diagnosis not present

## 2014-07-24 LAB — BASIC METABOLIC PANEL
ANION GAP: 12 (ref 5–15)
BUN: 12 mg/dL (ref 6–23)
CHLORIDE: 102 meq/L (ref 96–112)
CO2: 28 meq/L (ref 19–32)
Calcium: 9.5 mg/dL (ref 8.4–10.5)
Creatinine, Ser: 0.98 mg/dL (ref 0.50–1.10)
GFR calc Af Amer: 66 mL/min — ABNORMAL LOW (ref >90)
GFR calc non Af Amer: 57 mL/min — ABNORMAL LOW (ref >90)
Glucose, Bld: 101 mg/dL — ABNORMAL HIGH (ref 70–99)
POTASSIUM: 4 meq/L (ref 3.7–5.3)
SODIUM: 142 meq/L (ref 137–147)

## 2014-07-24 LAB — POC OCCULT BLOOD, ED: Fecal Occult Bld: NEGATIVE

## 2014-07-24 LAB — APTT: aPTT: 33 seconds (ref 24–37)

## 2014-07-24 LAB — CBC WITH DIFFERENTIAL/PLATELET
BASOS PCT: 0 % (ref 0–1)
Basophils Absolute: 0 10*3/uL (ref 0.0–0.1)
EOS ABS: 0.3 10*3/uL (ref 0.0–0.7)
EOS PCT: 4 % (ref 0–5)
HCT: 34.3 % — ABNORMAL LOW (ref 36.0–46.0)
Hemoglobin: 11.1 g/dL — ABNORMAL LOW (ref 12.0–15.0)
LYMPHS ABS: 1.2 10*3/uL (ref 0.7–4.0)
Lymphocytes Relative: 18 % (ref 12–46)
MCH: 30.3 pg (ref 26.0–34.0)
MCHC: 32.4 g/dL (ref 30.0–36.0)
MCV: 93.7 fL (ref 78.0–100.0)
Monocytes Absolute: 0.4 10*3/uL (ref 0.1–1.0)
Monocytes Relative: 6 % (ref 3–12)
NEUTROS PCT: 72 % (ref 43–77)
Neutro Abs: 5.1 10*3/uL (ref 1.7–7.7)
Platelets: 243 10*3/uL (ref 150–400)
RBC: 3.66 MIL/uL — AB (ref 3.87–5.11)
RDW: 15.4 % (ref 11.5–15.5)
WBC: 7 10*3/uL (ref 4.0–10.5)

## 2014-07-24 LAB — PROTIME-INR
INR: 1.47 (ref 0.00–1.49)
PROTHROMBIN TIME: 18 s — AB (ref 11.6–15.2)

## 2014-07-24 NOTE — ED Provider Notes (Signed)
CSN: [id number]     Arrival date & time 07/24/14  [id number] History   First MD Initiated Contact with Patient 07/24/14 [phone]     Chief Complaint  Patient presents with  . Rectal Bleeding     (Consider location/radiation/quality/duration/timing/severity/associated sxs/prior Treatment) HPI The patient reports that due to gas she's had for over a months duration she took Dulcolax to facilitate clearing herself out. She reports that she had multiple bowel movements over the course of the evening. She reports this morning she had bowel movements and something orange or reddish look like it was coming out. She reports it didn't look specifically like blood but there were a lot of pinkish red things in the toilet. At this point in time she is not expressing any pain she does not have lightheadedness or dizziness. The patient reports that she is on Coumadin. She has chronic back pain but she is not having any acute complaints regarding pain this morning.  Past Medical History  Diagnosis Date  . Depression   . Schizophrenia   . Hyperlipidemia   . Hypertension   . Chronic pain     "over my whole body" (03/30/2013)  . 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 DVT (deep venous thrombosis)     per 01/17/10 d/c summary- "remote hx of dvt"?  . Iron deficiency anemia   . Glaucoma   . Hemorrhoids   . Asthma   . Anxiety   . GERD (gastroesophageal reflux disease)   . Psoriasis 04/29/2012    New onset evaluated by Dr Marylou FlesherWilliam Huang, Dermatology, [facility]  Rx 0.1% Tacrolimus ointment  . Complication of anesthesia     "because I have sleep apnea" (03/30/2013)  . Heart murmur   . Chest pain, exertional   . Chronic bronchitis   . Sleep apnea     "waiting on my CPAP" (03/30/2013)  . Pneumonia     "a few times" (03/30/2013)  . Exertional shortness of breath     "& sometimes when laying down" (03/30/2013)  . Daily headache     "last 2 months" (03/30/2013)  . Arthritis     "joints"  (03/30/2013)  . Varicose veins of legs   . Psoriasis    Past Surgical History  Procedure Laterality Date  . Spinal fusion      2004  . Carpal tunnel release Bilateral   . Tubal ligation  1973  . Dilation and curettage of uterus  1970's    "once" (03/30/2013)  . 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 Joint replacement    . Vein surgery  left leg   Family History  Problem Relation Age of Onset  . Diabetes Sister   . Colon cancer Brother 40  . Colon cancer Brother    History  Substance Use Topics  . Smoking status: Never Smoker   . Smokeless tobacco: Never Used  . Alcohol Use: No   OB History   Grav Para Term Preterm Abortions TAB SAB Ect Mult Living                 Review of Systems  10 Systems reviewed and are negative for acute change except as noted in the HPI.   Allergies  Other  Home Medications   Prior to Admission medications   Medication Sig Start Date End Date Taking? Authorizing Provider  acetaminophen (TYLENOL) 500 MG tablet Take 1,000 mg by mouth every 6 (six) hours as needed for  mild pain (pain).    Yes Historical Provider, MD  albuterol (PROVENTIL HFA;VENTOLIN HFA) 108 (90 BASE) MCG/ACT inhaler Inhale 2 puffs into the lungs every 6 (six) hours as needed for wheezing or shortness of breath (wheezing).   Yes Historical Provider, MD  atenolol (TENORMIN) 100 MG tablet Take 100 mg by mouth daily.   Yes Historical Provider, MD  esomeprazole (NEXIUM) 20 MG capsule Take 20 mg by mouth daily. Pt uses Nexium 24hour OTC product.   Yes Historical Provider, MD  FLUoxetine (PROZAC) 20 MG capsule Take 3 capsules (60 mg total) by mouth daily. 05/26/14  Yes Tyrone Nineyan B Grunz, MD  furosemide (LASIX) 20 MG tablet Take 1 tablet (20 mg total) by mouth daily. 07/12/14  Yes Tyrone Nineyan B Grunz, MD  gabapentin (NEURONTIN) 300 MG capsule Take 300 mg by mouth daily as needed (pain).   Yes Historical Provider, MD  hydrochlorothiazide (HYDRODIURIL) 25 MG tablet Take 25 mg by mouth daily.   Yes  Historical Provider, MD  hydrOXYzine (ATARAX/VISTARIL) 10 MG tablet Take 10 mg by mouth at bedtime as needed for itching (itching).    Yes Historical Provider, MD  lisinopril (PRINIVIL,ZESTRIL) 10 MG tablet Take 10 mg by mouth daily.  05/08/14  Yes Historical Provider, MD  meclizine (ANTIVERT) 25 MG tablet Take 12.5 mg by mouth 3 (three) times daily as needed for dizziness (dizziness).   Yes Historical Provider, MD  montelukast (SINGULAIR) 10 MG tablet Take 1 tablet (10 mg total) by mouth at bedtime. 05/11/14  Yes Tyrone Nineyan B Grunz, MD  silver sulfADIAZINE (SILVADENE) 1 % cream Apply 1 application topically 3 (three) times daily. 05/11/14  Yes Tyrone Nineyan B Grunz, MD  sucralfate (CARAFATE) 1 G tablet Take 1 tablet (1 g total) by mouth 4 (four) times daily. 05/11/14  Yes Tyrone Nineyan B Grunz, MD  tacrolimus (PROTOPIC) 0.1 % ointment Apply 1 application topically 3 (three) times daily as needed (for legs). Apply to affected areas daily PRN 03/01/14 03/01/15 Yes Historical Provider, MD  traMADol (ULTRAM) 50 MG tablet Take 50 mg by mouth every 6 (six) hours as needed for moderate pain.   Yes Historical Provider, MD  triamcinolone cream (KENALOG) 0.1 % Apply 1 application topically 2 (two) times daily as needed (for legs).    Yes Historical Provider, MD  warfarin (COUMADIN) 5 MG tablet Take 1 tablet (5 mg total) by mouth daily. 01/28/14  Yes Levert FeinsteinJames M Granfortuna, MD   BP 148/73  Pulse 72  Temp(Src) 98.7 F (37.1 C) (Oral)  Resp 18  Ht 5' (1.524 m)  Wt 240 lb (108.863 kg)  BMI 46.87 kg/m2  SpO2 100%  LMP 11/22/1996 Physical Exam  Constitutional: She is oriented to person, place, and time. She appears well-developed and well-nourished.  HENT:  Head: Normocephalic and atraumatic.  Eyes: EOM are normal. Pupils are equal, round, and reactive to light.  Neck: Neck supple.  Cardiovascular: Normal rate, regular rhythm, normal heart sounds and intact distal pulses.   Pulmonary/Chest: Effort normal. No respiratory distress. She has  wheezes.  Abdominal: Soft. Bowel sounds are normal. She exhibits no distension. There is no tenderness. There is no rebound and no guarding.  Abdomen is obese and soft, nontender to palpation  Genitourinary:  External rectal examination shows a hemorrhoidal tag but no thrombosed hemorrhoid. The perianal tissues appear grossly normal. There are several small flecks of an orange material present. Also the pad she is wearing has a small amount of orange Kool-Aid-colored material on it a fairly small amount. There  is no stool in the vault. The vault is smooth and clear.  Musculoskeletal: Normal range of motion. She exhibits no edema.  The patient has trace to 1+ edema of the left lower extremity which has chronic skin changes of stasis. She reports that this is a chronic condition for her. She does go to wound care for management. Both lower extremities however are well-healed there are no active skin wounds no signs of cellulitis.  Neurological: She is alert and oriented to person, place, and time. She has normal strength. Coordination normal. GCS eye subscore is 4. GCS verbal subscore is 5. GCS motor subscore is 6.  Skin: Skin is warm, dry and intact.  Psychiatric: She has a normal mood and affect.    ED Course  Procedures (including critical care time) Labs Review Labs Reviewed  BASIC METABOLIC PANEL - Abnormal; Notable for the following:    Glucose, Bld 101 (*)    GFR calc non Af Amer 57 (*)    GFR calc Af Amer 66 (*)    All other components within normal limits  CBC WITH DIFFERENTIAL - Abnormal; Notable for the following:    RBC 3.66 (*)    Hemoglobin 11.1 (*)    HCT 34.3 (*)    All other components within normal limits  PROTIME-INR - Abnormal; Notable for the following:    Prothrombin Time 18.0 (*)    All other components within normal limits  APTT  POC OCCULT BLOOD, ED  POC OCCULT BLOOD, ED    Imaging Review No results found.   EKG Interpretation None      MDM   Final  diagnoses:  Abnormal stool color  Anticoagulated on Coumadin   The patient has stable hemoglobin, rectal examination is negative for blood and objective findings are suggestive of a food source for the orange appearance of her stool. She is well in appearance this morning and has stable vital signs. The patient is stable for discharge and continued outpatient management with her family doctor.    Arby Barrette, MD 07/24/14 [phone]

## 2014-07-24 NOTE — Discharge Instructions (Signed)
Anticoagulation, Generic  Anticoagulants are medicines used to prevent clots from developing in your veins. These medicine are also known as blood thinners. If blood clots are untreated, they could travel to your lungs. This is called a pulmonary embolus. A blood clot in your lungs can be fatal.   Health care providers often use anticoagulants to prevent clots following surgery. Anticoagulants are also used along with aspirin when the heart is not getting enough blood.  Another anticoagulant called warfarin is started 2 to 3 days after a rapid-acting injectable anticoagulant is started. The rapid-acting anticoagulants are usually continued until warfarin has begun to work. Your health care provider will judge this length of time by blood tests known as the prothrombin time (PT) and International Normalization Ratio (INR). This means that your blood is at the necessary and best level to prevent clots.  RISKS AND COMPLICATIONS  · If you have received recent epidural anesthesia, spinal anesthesia, or a spinal tap while receiving anticoagulants, you are at risk for developing a blood clot in or around the spine. This condition could result in long-term or permanent paralysis.  · Because anticoagulants thin your blood, severe bleeding may occur from any tissue or organ. Symptoms of the blood being too thin may include:  ¨ Bleeding from the nose or gums that does not stop quickly.  ¨ Blood in bowel movements which may appear as bright red, dark, or black tarry stools.  ¨ Blood in the urine which may appear as pink, red, or brown urine.  ¨ Unusual bruising or bruising easily.  ¨ A cut that does not stop bleeding within 10 minutes.  ¨ Vomiting blood or continuous nausea for more than 1 day.  ¨ Coughing up blood.  ¨ Broken blood vessels in your eye (subconjunctival hemorrhage).  ¨ Abdominal or back pain with or without flank bruising.  ¨ Sudden, severe headache.  ¨ Sudden weakness or numbness of the face, arm, or leg,  especially on one side of the body.  ¨ Sudden confusion.  ¨ Trouble speaking (aphasia) or understanding.  ¨ Sudden trouble seeing in one or both eyes.  ¨ Sudden trouble walking.  ¨ Dizziness.  ¨ Loss of balance or coordination.  ¨ Vaginal bleeding.  ¨ Swelling or pain at an injection site.  ¨ Superficial fat tissue death (necrosis) which may cause skin scarring. This is more common in women and may first present as pain in the waist, thighs, or buttocks.  ¨ Fever.  · Too little anticoagulation continues to allow the risk for blood clots.  HOME CARE INSTRUCTIONS   · Due to the complications of anticoagulants, it is very important that you take your anticoagulant as directed by your health care provider. Anticoagulants need to be taken exactly as instructed. Be sure you understand all your anticoagulant instructions.  · Keep all follow-up appointments with your health care provider as directed. It is very important to keep your appointments. Not keeping appointments could result in a chronic or permanent injury, pain, or disability.  · Warfarin. Your health care provider will advise you on the length of treatment (usually 3-6 months, sometimes lifelong).  ¨ Take warfarin exactly as directed by your health care provider. It is recommended that you take your warfarin dose at the same time of the day. It is preferred that you take warfarin in the late afternoon. If you have been told to stop taking warfarin, do not resume taking warfarin until directed to do so by your health care   provider. Follow your health care provider's instructions if you accidentally take an extra dose or miss a dose of warfarin. It is very important to take warfarin as directed since bleeding or blood clots could result in chronic or permanent injury, pain, or disability.  ¨ Too much and too little warfarin are both dangerous. Too much warfarin increases the risk of bleeding. Too little warfarin continues to allow the risk for blood clots. While  taking warfarin, you will need to have regular blood tests to measure your blood clotting time. These blood tests usually include both the prothrombin time (PT) and International Normalized Ratio (INR) tests. The PT and INR results allow your health care provider to adjust your dose of warfarin. The dose can change for many reasons. It is critically important that you have your PT and INR levels drawn exactly as directed. Your warfarin dose may stay the same or change depending on what the PT and INR results are. Be sure to follow up with your health care provider regarding your PT and INR test results and what your warfarin dosage should be.  ¨ Many medicines can interfere with warfarin and affect the PT and INR results. You must tell your health care provider about any and all medicines you take, this includes all vitamins and supplements. Ask your health care provider before taking these. Prescription and over-the-counter medicine consistency is critical to warfarin management. It is important that potential interactions are checked before you start a new medicine. Be especially cautious with aspirin and anti-inflammatory medicines. Ask your health care provider before taking these. Medicines such as antibiotics and acid-reducing medicine can interact with warfarin and can cause an increased warfarin effect. Warfarin can also interfere with the effectiveness of medicines you are taking. Do not take or discontinue any prescribed or over-the-counter medicine except on the advice of your health care provider or pharmacist.  ¨ Some vitamins, supplements, and herbal products interfere with the effectiveness of warfarin. Vitamin E may increase the anticoagulant effects of warfarin. Vitamin K may can cause warfarin to be less effective. Do not take or discontinue any vitamin, supplement, or herbal product except on the advice of your health care provider or pharmacist.  ¨ Eat what you normally eat and keep the vitamin K  content of your diet consistent. Avoid major changes in your diet, or notify your health care provider before changing your diet. Suddenly getting a lot more vitamin K could cause your blood to clot too quickly. A sudden decrease in vitamin K intake could cause your blood to clot too slowly. These changes in vitamin K intake could lead to dangerous blood clots or to bleeding. To keep your vitamin K intake consistent, you must be aware of which foods contain moderate or high amounts of vitamin K. Some foods high in vitamin K include spinach, kale, broccoli, cabbage, greens, Brussels sprouts, asparagus, Bok [place]oy, coleslaw, parsley, and green tea. Arrange a visit with a dietitian to answer your questions.  ¨ If you have a loss of appetite or get the stomach flu (viral gastroenteritis), talk to your health care provider as soon as possible. A decrease in your normal vitamin K intake can make you more sensitive to your usual dose of warfarin.  ¨ Some medical conditions may increase your risk for bleeding while you are taking warfarin. A fever, diarrhea lasting more than a day, worsening heart failure, or worsening liver function are some medical conditions that could affect warfarin. Contact your health care provider if   you have any of these medical conditions.  ¨ Alcohol can change the body's ability to handle warfarin. It is best to avoid alcoholic drinks or consume only very small amounts while taking warfarin. Notify your health care provider if you change your alcohol intake. A sudden increase in alcohol use can increase your risk of bleeding. Chronic alcohol use can cause warfarin to be less effective.  · Be careful not to cut yourself when using sharp objects or while shaving.  · Inform all your health care providers and your dentist that you take an anticoagulant.  · Limit physical activities or sports that could result in a fall or cause injury. Avoid contact sports.  · Wear medical alert jewelry or carry a  medical alert card.  SEEK IMMEDIATE MEDICAL CARE IF:  · You cough up blood.  · You have dark or black stools or there is bright red blood coming from your rectum.  · You vomit blood or have nausea for more than 1 day.  · You have blood in the urine or pink colored urine.  · You have unusual bruising or have increased bruising.  · You have bleeding from the nose or gums that does not stop quickly.  · You have a cut that does not stop bleeding within a 2-3 minutes.  · You have sudden weakness or numbness of the face, arm, or leg, especially on one side of the body.  · You have sudden confusion.  · You have trouble speaking (aphasia) or understanding.  · You have sudden trouble seeing in one or both eyes.  · You have sudden trouble walking.  · You have dizziness.  · You have a loss of balance or coordination.  · You have a sudden, severe headache.  · You have a serious fall or head injury, even if you are not bleeding.  · You have swelling or pain at an injection site.  · You have unexplained tenderness or pain in the abdomen, back, waist, thighs or buttocks.  · You have a fever.  Any of these symptoms may represent a serious problem that is an emergency. Do not wait to see if the symptoms will go away. Get medical help right away. Call your local emergency services (911 in [place] Do not drive yourself to the hospital.  Document Released: 09/10/2005 Document Revised: 09/15/2013 Document Reviewed: 04/14/2008  [employer]® Patient Information ©2015 [employer]. This information is not intended to replace advice given to you by your health care provider. Make sure you discuss any questions you have with your health care provider.

## 2014-07-24 NOTE — ED Notes (Signed)
EMS - Patient coming from home with c/o of having lower back pain, gas and having blood in her stool.  Pt states she took a ducolax yesterday when the symptoms of blood in the stools started.  The pain and gas has been ongoing x 1 month.

## 2014-07-24 NOTE — ED Notes (Signed)
Pt states issues with abdominal cramping and lower back pain, states she took laxative at home last night, states red colored tint to stools, describes as "watermelon color." pt states 10/10 abdominal discomfort upon arrival to ED. No nausea/vomiting. Abdomen soft and non tender to palpation. Bowel sounds present.

## 2014-07-26 ENCOUNTER — Ambulatory Visit (INDEPENDENT_AMBULATORY_CARE_PROVIDER_SITE_OTHER): Payer: PRIVATE HEALTH INSURANCE | Admitting: Family Medicine

## 2014-07-26 VITALS — BP 166/81 | HR 58 | Temp 98.1°F | Wt 248.5 lb

## 2014-07-26 DIAGNOSIS — G894 Chronic pain syndrome: Secondary | ICD-10-CM

## 2014-07-28 ENCOUNTER — Encounter: Payer: Self-pay | Admitting: Family Medicine

## 2014-07-28 NOTE — Progress Notes (Signed)
Patient ID: [name] [name], female   DOB: 04/06/1941, 73 y.o.   MRN: [id number]   Subjective:  [name] SnowballDora C [name] is a 73 y.o. female with a history of chronic pain, spinal fusion and osteoarthritis, as well as frequent no shows here for chronic pain.  [name] [name] presents in good spirits but reports severe pain worsened from prior visits. This pain has no localization, "all over." It is only helped very minimally by tramadol. She again, as in other visits, admits taking many more pills per day than our agreed upon regimen. She has no-showed to multiple appointments since our last office visit on 8/18. She explains that it was a hot summer which makes her pain worse so she can't get out of the house. She has also continued to present to the emergency room (8 times since our last visit) where she has received narcotics and prescriptions for narcotics. When asked about the pain contract I reviewed with her in detail at our last visit she reports not remembering signing this. When shown a copy she reports that she does remember signing it but that she can't control whether other providers prescribe her narcotics. She does admit to filling the prescriptions.   At this point [name] [name] begins speaking in exasperated tone about her disabled son with epilepsy and that she wants a letter of dismissal from this practice. Of note, this practice received a change of PCP request to a "lady doctor" at an unknown practice off [address] in September, though she reports not seeing other providers and there are no notes in [facility]. Upon review of the records she has left this clinic in the past under similar circumstances after being told that we would only manage her other chronic medical conditions and would place a referral to a pain specialist to manage her chronic pain.  Objective:  BP 166/81 mmHg  Pulse 58  Temp(Src) 98.1 F (36.7 C) (Oral)  Wt 248 lb 8 oz (112.719 kg)  LMP 11/22/1996  Gen: Well dressed, well groomed 73 y.o.  female in no distress.  Patient left clinic prior to physical exam.   Assessment & Plan:  [name] [name] is a 73 y.o. female with chronic pain and a history of frequently changing medical practices, frequently no showing to office visits, obtaining controlled opioid medications from multiple providers in knowing violation of the pain contract signed after thorough review 05/11/2014 (see clinic note). In light of this ongoing disregard for the care which is in the best interest of the patient's health and safety, we discussed the need to restrict further prescribing of opioid medications per the stipulations of the pain contract. Though [name] [name] was displeased, I made it very clear that this clinic would be happy to continue providing medical services for any other acute or chronic medical issues. I also offered referral to any pain specialist she would like to see which she declined, replying, "they all don't know what they're doing." She then reported that she was going to establish care with a doctor in [place] and wanted a letter of dismissal from this practice which we could not supply as she is not being dismissed from this practice. The staff and I attempted to make it clear that we would send records wherever she would like but that she needed to sign a release of information first.   This encounter was discussed in detail with Altamese DillingJeannette Richardson, RN.

## 2014-07-30 ENCOUNTER — Other Ambulatory Visit: Payer: Self-pay | Admitting: Family Medicine

## 2014-08-02 ENCOUNTER — Telehealth: Payer: Self-pay | Admitting: Pharmacist

## 2014-08-02 NOTE — Telephone Encounter (Signed)
Left vm for pt to [facility] for [facility]/CC appts -  Noted INR = 1.47 in the ED on 07/24/14. Ebony HailGinna Laurie Penado, Pharm.D., CPP 08/02/2014@4 :29 PM

## 2014-08-06 NOTE — Telephone Encounter (Signed)
LVM for patient to call back to inform her that Dr. Jarvis NewcomerGrunz has sent in requested rx

## 2014-08-12 ENCOUNTER — Telehealth: Payer: Self-pay | Admitting: Pharmacist

## 2014-08-12 NOTE — Telephone Encounter (Signed)
Mrs. [name] [name] called and wanted to know if she had been dropped from the Coumadin clinic. She states that she has not heard from the Coumadin clinic for some time. Had a very extensive call with patient. Explained to her that we had called and left her VM but she had not returned those calls. She says that she has not checked her VM in weeks because her husband had a stroke and she has had no time to check her phone. She says that she really does not have time to see us. Explained that if she continues on Coumadin it is important for her to be seen regularly and we need to see her soon. She will come in next Wednesday 08/18/14 for Coumadin clinic visit. Patient states that she wrote down the date and time and will be here.

## 2014-08-18 ENCOUNTER — Other Ambulatory Visit: Payer: PRIVATE HEALTH INSURANCE

## 2014-08-18 ENCOUNTER — Ambulatory Visit: Payer: PRIVATE HEALTH INSURANCE

## 2014-08-18 ENCOUNTER — Telehealth: Payer: Self-pay | Admitting: Pharmacist

## 2014-08-18 ENCOUNTER — Ambulatory Visit: Payer: Medicare Other

## 2014-08-18 NOTE — Telephone Encounter (Signed)
Pt FTKA for lab and coumadin clinic today. Pt r/s her lab and coumadin appt from today to 08/24/14: Lab = 10:30am CC = 10:45am.

## 2014-08-24 ENCOUNTER — Ambulatory Visit: Payer: PRIVATE HEALTH INSURANCE

## 2014-08-24 ENCOUNTER — Other Ambulatory Visit: Payer: PRIVATE HEALTH INSURANCE

## 2014-08-25 ENCOUNTER — Telehealth: Payer: Self-pay | Admitting: Pharmacist
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
## 2014-08-25 NOTE — Addendum Note (Signed)
Addended by: Bufford SpikesFULCHER, Felita Bump N on: 08/25/2014 11:05 AM   Modules accepted: Orders

## 2014-08-26 NOTE — Telephone Encounter (Signed)
Thanks!  What a mess

## 2014-08-31 ENCOUNTER — Encounter (HOSPITAL_COMMUNITY): Payer: Self-pay | Admitting: Emergency Medicine

## 2014-08-31 ENCOUNTER — Emergency Department (HOSPITAL_COMMUNITY)
Admission: EM | Admit: 2014-08-31 | Discharge: 2014-08-31 | Disposition: A | Discharge status: Home / Self Care | Payer: PRIVATE HEALTH INSURANCE | Attending: Emergency Medicine | Admitting: Emergency Medicine

## 2014-08-31 DIAGNOSIS — Z872 Personal history of diseases of the skin and subcutaneous tissue: Secondary | ICD-10-CM | POA: Insufficient documentation

## 2014-08-31 DIAGNOSIS — M79605 Pain in left leg: Secondary | ICD-10-CM | POA: Insufficient documentation

## 2014-08-31 DIAGNOSIS — I8312 Varicose veins of left lower extremity with inflammation: Secondary | ICD-10-CM

## 2014-08-31 DIAGNOSIS — Z792 Long term (current) use of antibiotics: Secondary | ICD-10-CM | POA: Diagnosis not present

## 2014-08-31 DIAGNOSIS — K210 Gastro-esophageal reflux disease with esophagitis: Secondary | ICD-10-CM | POA: Insufficient documentation

## 2014-08-31 DIAGNOSIS — Z86718 Personal history of other venous thrombosis and embolism: Secondary | ICD-10-CM | POA: Insufficient documentation

## 2014-08-31 DIAGNOSIS — F209 Schizophrenia, unspecified: Secondary | ICD-10-CM | POA: Insufficient documentation

## 2014-08-31 DIAGNOSIS — G8929 Other chronic pain: Secondary | ICD-10-CM | POA: Diagnosis not present

## 2014-08-31 DIAGNOSIS — J45909 Unspecified asthma, uncomplicated: Secondary | ICD-10-CM | POA: Insufficient documentation

## 2014-08-31 DIAGNOSIS — I10 Essential (primary) hypertension: Secondary | ICD-10-CM | POA: Insufficient documentation

## 2014-08-31 DIAGNOSIS — K219 Gastro-esophageal reflux disease without esophagitis: Secondary | ICD-10-CM

## 2014-08-31 DIAGNOSIS — R1013 Epigastric pain: Secondary | ICD-10-CM | POA: Diagnosis present

## 2014-08-31 DIAGNOSIS — R011 Cardiac murmur, unspecified: Secondary | ICD-10-CM | POA: Insufficient documentation

## 2014-08-31 DIAGNOSIS — M1990 Unspecified osteoarthritis, unspecified site: Secondary | ICD-10-CM | POA: Diagnosis not present

## 2014-08-31 DIAGNOSIS — G4730 Sleep apnea, unspecified: Secondary | ICD-10-CM | POA: Insufficient documentation

## 2014-08-31 DIAGNOSIS — Z86711 Personal history of pulmonary embolism: Secondary | ICD-10-CM | POA: Insufficient documentation

## 2014-08-31 DIAGNOSIS — H409 Unspecified glaucoma: Secondary | ICD-10-CM | POA: Insufficient documentation

## 2014-08-31 DIAGNOSIS — E669 Obesity, unspecified: Secondary | ICD-10-CM | POA: Diagnosis not present

## 2014-08-31 DIAGNOSIS — F419 Anxiety disorder, unspecified: Secondary | ICD-10-CM | POA: Insufficient documentation

## 2014-08-31 DIAGNOSIS — Z862 Personal history of diseases of the blood and blood-forming organs and certain disorders involving the immune mechanism: Secondary | ICD-10-CM | POA: Diagnosis not present

## 2014-08-31 DIAGNOSIS — F329 Major depressive disorder, single episode, unspecified: Secondary | ICD-10-CM | POA: Insufficient documentation

## 2014-08-31 DIAGNOSIS — Z79899 Other long term (current) drug therapy: Secondary | ICD-10-CM | POA: Diagnosis not present

## 2014-08-31 DIAGNOSIS — Z8701 Personal history of pneumonia (recurrent): Secondary | ICD-10-CM | POA: Insufficient documentation

## 2014-08-31 DIAGNOSIS — I872 Venous insufficiency (chronic) (peripheral): Secondary | ICD-10-CM | POA: Insufficient documentation

## 2014-08-31 DIAGNOSIS — Z7901 Long term (current) use of anticoagulants: Secondary | ICD-10-CM | POA: Insufficient documentation

## 2014-08-31 MED ORDER — TRAMADOL HCL 50 MG PO TABS
100.0000 mg | ORAL_TABLET | Freq: Once | ORAL | Status: AC
  Administered 2014-08-31: 100 mg via ORAL
  Filled 2014-08-31: qty 2

## 2014-08-31 MED ORDER — OMEPRAZOLE 40 MG PO CPDR: 40.0000 mg | DELAYED_RELEASE_CAPSULE | Freq: Every day | ORAL | Status: DC

## 2014-08-31 MED ORDER — ALUM & MAG HYDROXIDE-SIMETH 200-200-20 MG/5ML PO SUSP
15.0000 mL | Freq: Once | ORAL | Status: AC
  Administered 2014-08-31: 15 mL via ORAL
  Filled 2014-08-31: qty 30

## 2014-08-31 MED ORDER — PANTOPRAZOLE SODIUM 40 MG PO TBEC
40.0000 mg | DELAYED_RELEASE_TABLET | Freq: Every day | ORAL | Status: DC
  Administered 2014-08-31: 40 mg via ORAL
  Filled 2014-08-31: qty 1

## 2014-08-31 MED ORDER — TRAMADOL HCL 50 MG PO TABS: 50.0000 mg | ORAL_TABLET | Freq: Four times a day (QID) | ORAL | Status: DC | PRN

## 2014-08-31 NOTE — ED Provider Notes (Signed)
CSN: [id number]     Arrival date & time 08/31/14  [id number] History   First MD Initiated Contact with Patient 08/31/14 [phone]     Chief Complaint  Patient presents with  . Gastrophageal Reflux  . Leg Pain     (Consider location/radiation/quality/duration/timing/severity/associated sxs/prior Treatment) HPI The patient reports that she has been unable to attend to her chronic medical problems. She reports that she has been spending time in the hospital with her son who is in the ICU and her husband passed away within the past several weeks. She reports she has very bad GERD and has run out of her Prilosec. She reports last night she started to cough and started getting bitter bile tasting stuff coming up in the back of her throat and now is making her voice raspy. She reports she has a little bit of epigastric discomfort that she reports is normal for her and nothing new. She reports she has been unable to leave the hospital to get medications for this condition.   She also reports that her left leg chronically has pain. She reports she has severe varicose veins and she routinely takes tramadol for pain control for her leg. There has been no new redness or swelling. She reports the pain has been exacerbated because she spent "too much time on her feet lately" she reports that she is supposed to be wearing a compression sock which she doesn't have right now. She reports she has been trying to wrap it which helps a little bit. She reports she would like treatment for these conditions in till she can get home and resume her normal care. Past Medical History  Diagnosis Date  . Depression   . Schizophrenia   . Hyperlipidemia   . Hypertension   . Chronic pain     "over my whole body" (03/30/2013)  . 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 DVT (deep venous thrombosis)     per 01/17/10 d/c summary- "remote hx of dvt"?  . Iron deficiency anemia   . Glaucoma   . Hemorrhoids   . Asthma    . Anxiety   . GERD (gastroesophageal reflux disease)   . Psoriasis 04/29/2012    New onset evaluated by Dr Marylou FlesherWilliam Huang[facility]  Rx 0.1% Tacrolimus ointment  . Complication of anesthesia     "because I have sleep apnea" (03/30/2013)  . Heart murmur   . Chest pain, exertional   . Chronic bronchitis   . Sleep apnea     "waiting on my CPAP" (03/30/2013)  . Pneumonia     "a few times" (03/30/2013)  . Exertional shortness of breath     "& sometimes when laying down" (03/30/2013)  . Daily headache     "last 2 months" (03/30/2013)  . Arthritis     "joints" (03/30/2013)  . Varicose veins of legs   . Psoriasis    Past Surgical History  Procedure Laterality Date  . Spinal fusion      2004  . Carpal tunnel release Bilateral   . Tubal ligation  1973  . Dilation and curettage of uterus  1970's    "once" (03/30/2013)  . 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 Joint replacement    . Vein surgery  left leg   Family History  Problem Relation Age of Onset  . Diabetes Sister   . Colon cancer Brother 40  . Colon cancer Brother    History  Substance Use Topics  . Smoking status: Never Smoker   . Smokeless tobacco: Never Used  . Alcohol Use: No   OB History    No data available     Review of Systems  10 Systems reviewed and are negative for acute change except as noted in the HPI.  Allergies  Other  Home Medications   Prior to Admission medications   Medication Sig Start Date End Date Taking? Authorizing Provider  acetaminophen (TYLENOL) 500 MG tablet Take 1,000 mg by mouth every 6 (six) hours as needed for mild pain (pain).     Historical Provider, MD  albuterol (PROVENTIL HFA;VENTOLIN HFA) 108 (90 BASE) MCG/ACT inhaler Inhale 2 puffs into the lungs every 6 (six) hours as needed for wheezing or shortness of breath (wheezing).    Historical Provider, MD  atenolol (TENORMIN) 100 MG tablet Take 100 mg by mouth daily.    Historical Provider, MD  esomeprazole (NEXIUM)  20 MG capsule Take 20 mg by mouth daily. Pt uses Nexium 24hour OTC product.    Historical Provider, MD  FLUoxetine (PROZAC) 20 MG capsule Take 3 capsules (60 mg total) by mouth daily. 05/26/14   Tyrone Nineyan B Grunz, MD  furosemide (LASIX) 20 MG tablet Take 1 tablet (20 mg total) by mouth daily. 07/12/14   Tyrone Nineyan B Grunz, MD  gabapentin (NEURONTIN) 300 MG capsule Take 300 mg by mouth daily as needed (pain).    Historical Provider, MD  hydrochlorothiazide (HYDRODIURIL) 25 MG tablet Take 25 mg by mouth daily.    Historical Provider, MD  hydrOXYzine (ATARAX/VISTARIL) 10 MG tablet Take 10 mg by mouth at bedtime as needed for itching (itching).     Historical Provider, MD  lisinopril (PRINIVIL,ZESTRIL) 10 MG tablet Take 10 mg by mouth daily.  05/08/14   Historical Provider, MD  meclizine (ANTIVERT) 25 MG tablet Take 12.5 mg by mouth 3 (three) times daily as needed for dizziness (dizziness).    Historical Provider, MD  montelukast (SINGULAIR) 10 MG tablet Take 1 tablet (10 mg total) by mouth at bedtime. 05/11/14   Tyrone Nineyan B Grunz, MD  omeprazole (PRILOSEC) 40 MG capsule Take 1 capsule (40 mg total) by mouth daily. 08/31/14   Arby BarretteMarcy Macon Sandiford, MD  silver sulfADIAZINE (SILVADENE) 1 % cream APPLY 1 APPLICATION TOPICALLY DAILY. 08/06/14   Tyrone Nineyan B Grunz, MD  sucralfate (CARAFATE) 1 G tablet Take 1 tablet (1 g total) by mouth 4 (four) times daily. 05/11/14   Tyrone Nineyan B Grunz, MD  tacrolimus (PROTOPIC) 0.1 % ointment Apply 1 application topically 3 (three) times daily as needed (for legs). Apply to affected areas daily PRN 03/01/14 03/01/15  Historical Provider, MD  traMADol (ULTRAM) 50 MG tablet Take 50 mg by mouth every 6 (six) hours as needed for moderate pain.    Historical Provider, MD  traMADol (ULTRAM) 50 MG tablet Take 1 tablet (50 mg total) by mouth every 6 (six) hours as needed. 08/31/14   Arby BarretteMarcy Collan Schoenfeld, MD  triamcinolone cream (KENALOG) 0.1 % Apply 1 application topically 2 (two) times daily as needed (for legs).     Historical  Provider, MD  warfarin (COUMADIN) 5 MG tablet Take 1 tablet (5 mg total) by mouth daily. 01/28/14   Levert FeinsteinJames M Granfortuna, MD   BP 136/76 mmHg  Pulse 56  Temp(Src) 98 F (36.7 C) (Oral)  Resp 16  SpO2 100%  LMP 11/22/1996 Physical Exam  Constitutional:  The patient is moderately obese. She is well in appearance. No respiratory distress.  HENT:  Head: Normocephalic and atraumatic.  Eyes: EOM are normal.  Cardiovascular: Normal rate, regular rhythm, normal heart sounds and intact distal pulses.   Pulmonary/Chest: Effort normal and breath sounds normal. No respiratory distress. She has no wheezes. She has no rales.  Abdominal: Soft. Bowel sounds are normal. She exhibits no distension. There is no tenderness. There is no rebound and no guarding.  Musculoskeletal:  The patient's left lower extremity has multiple chronic varicosities. There is also chronic hyperpigmentation consistent with chronic venous stasis. The patient does not have any acute erythema. The leg has some mild tenderness diffusely. There is good perfusion the foot without any general edema of the foot which is warm and dry. No localizing calf pain.    ED Course  Procedures (including critical care time) Labs Review Labs Reviewed - No data to display  Imaging Review No results found.   EKG Interpretation None      MDM   Final diagnoses:  Gastroesophageal reflux disease, esophagitis presence not specified  Chronic venous stasis dermatitis, left   The patient identifies offer complaints today as being due to not having her medications available to her. At this point she is well in appearance and did not appear to be acute medical conditions requiring further diagnostic testing. The patient will be treated for her reflux and pain associated with chronic venous stasis.    Arby Barrette, MD 09/02/14 [phone]

## 2014-08-31 NOTE — ED Notes (Signed)
MD at bedside. 

## 2014-08-31 NOTE — ED Notes (Signed)
Pt states her son is here in [facility]. Pt states "I've been driving here the past 2 days to take care of him and I just think I need some attention."

## 2014-08-31 NOTE — ED Notes (Addendum)
Pt states she has been out of her GERD medicine for a month and out of her pain medicine for 2 weeks. Wanting refills.pt states "acid reflux is just terrible today." states she had one episode of vomiting yesterday "and it burned."

## 2014-08-31 NOTE — ED Notes (Signed)
Pt reports relief of acid reflux after meds.

## 2014-09-03 ENCOUNTER — Other Ambulatory Visit (HOSPITAL_BASED_OUTPATIENT_CLINIC_OR_DEPARTMENT_OTHER): Payer: PRIVATE HEALTH INSURANCE

## 2014-09-03 ENCOUNTER — Ambulatory Visit (HOSPITAL_BASED_OUTPATIENT_CLINIC_OR_DEPARTMENT_OTHER): Payer: PRIVATE HEALTH INSURANCE | Admitting: Pharmacist

## 2014-09-03 DIAGNOSIS — D6851 Activated protein C resistance: Secondary | ICD-10-CM

## 2014-09-03 DIAGNOSIS — D6859 Other primary thrombophilia: Secondary | ICD-10-CM

## 2014-09-03 DIAGNOSIS — I2699 Other pulmonary embolism without acute cor pulmonale: Secondary | ICD-10-CM

## 2014-09-03 LAB — PROTIME-INR
INR: 3.2 (ref 2.00–3.50)
PROTIME: 38.4 s — AB (ref 10.6–13.4)

## 2014-09-03 LAB — POCT INR: INR: 3.2

## 2014-09-03 NOTE — Progress Notes (Signed)
INR = 3.2 on Coumadin 5 mg daily We reviewed some of her meds today (just the ones she brought in a bag).  There haven't been any changes in her meds recently.  She says she still needs to sched appt w/ ortho for knee injection(s).  I reminded her that if she gets the inj, sometimes they want pts to hold their Coumadin around the procedure.   INR at goal, essentially.  She will continue Coumadin 5 mg daily. Return in 4 weeks. Ebony HailGinna Johnathon Mittal, Pharm.D., CPP 09/03/2014@1 :11 PM

## 2014-09-04 ENCOUNTER — Other Ambulatory Visit: Payer: Self-pay | Admitting: Family Medicine

## 2014-09-04 ENCOUNTER — Encounter (HOSPITAL_COMMUNITY): Payer: Self-pay | Admitting: Oncology

## 2014-09-04 ENCOUNTER — Emergency Department (HOSPITAL_COMMUNITY)
Admission: EM | Admit: 2014-09-04 | Discharge: 2014-09-04 | Disposition: A | Discharge status: Home / Self Care | Payer: PRIVATE HEALTH INSURANCE | Attending: Emergency Medicine | Admitting: Emergency Medicine

## 2014-09-04 DIAGNOSIS — M1990 Unspecified osteoarthritis, unspecified site: Secondary | ICD-10-CM | POA: Insufficient documentation

## 2014-09-04 DIAGNOSIS — F329 Major depressive disorder, single episode, unspecified: Secondary | ICD-10-CM | POA: Insufficient documentation

## 2014-09-04 DIAGNOSIS — K219 Gastro-esophageal reflux disease without esophagitis: Secondary | ICD-10-CM | POA: Diagnosis not present

## 2014-09-04 DIAGNOSIS — Z79899 Other long term (current) drug therapy: Secondary | ICD-10-CM | POA: Insufficient documentation

## 2014-09-04 DIAGNOSIS — R011 Cardiac murmur, unspecified: Secondary | ICD-10-CM | POA: Diagnosis not present

## 2014-09-04 DIAGNOSIS — Z872 Personal history of diseases of the skin and subcutaneous tissue: Secondary | ICD-10-CM | POA: Diagnosis not present

## 2014-09-04 DIAGNOSIS — Z86711 Personal history of pulmonary embolism: Secondary | ICD-10-CM | POA: Diagnosis not present

## 2014-09-04 DIAGNOSIS — Z7901 Long term (current) use of anticoagulants: Secondary | ICD-10-CM | POA: Diagnosis not present

## 2014-09-04 DIAGNOSIS — G8929 Other chronic pain: Secondary | ICD-10-CM | POA: Insufficient documentation

## 2014-09-04 DIAGNOSIS — J45909 Unspecified asthma, uncomplicated: Secondary | ICD-10-CM | POA: Diagnosis not present

## 2014-09-04 DIAGNOSIS — Z8701 Personal history of pneumonia (recurrent): Secondary | ICD-10-CM | POA: Insufficient documentation

## 2014-09-04 DIAGNOSIS — M545 Low back pain, unspecified: Secondary | ICD-10-CM

## 2014-09-04 DIAGNOSIS — Z9981 Dependence on supplemental oxygen: Secondary | ICD-10-CM | POA: Insufficient documentation

## 2014-09-04 DIAGNOSIS — Z862 Personal history of diseases of the blood and blood-forming organs and certain disorders involving the immune mechanism: Secondary | ICD-10-CM | POA: Insufficient documentation

## 2014-09-04 DIAGNOSIS — G4730 Sleep apnea, unspecified: Secondary | ICD-10-CM | POA: Diagnosis not present

## 2014-09-04 DIAGNOSIS — I10 Essential (primary) hypertension: Secondary | ICD-10-CM | POA: Insufficient documentation

## 2014-09-04 DIAGNOSIS — F419 Anxiety disorder, unspecified: Secondary | ICD-10-CM | POA: Insufficient documentation

## 2014-09-04 DIAGNOSIS — E785 Hyperlipidemia, unspecified: Secondary | ICD-10-CM | POA: Diagnosis not present

## 2014-09-04 DIAGNOSIS — Z86718 Personal history of other venous thrombosis and embolism: Secondary | ICD-10-CM | POA: Insufficient documentation

## 2014-09-04 DIAGNOSIS — F209 Schizophrenia, unspecified: Secondary | ICD-10-CM | POA: Diagnosis not present

## 2014-09-04 MED ORDER — TRAMADOL HCL 50 MG PO TABS: 50.0000 mg | ORAL_TABLET | Freq: Four times a day (QID) | ORAL | Status: DC | PRN

## 2014-09-04 MED ORDER — OXYCODONE-ACETAMINOPHEN 5-325 MG PO TABS
2.0000 | ORAL_TABLET | Freq: Once | ORAL | Status: AC
  Administered 2014-09-04: 2 via ORAL
  Filled 2014-09-04: qty 2

## 2014-09-04 NOTE — ED Provider Notes (Signed)
Medical screening examination/treatment/procedure(s) were conducted as a shared visit with non-physician practitioner(s) and myself.  I personally evaluated the patient during the encounter.   EKG Interpretation None     Patient here with chronic back pain and she is well-known to me. No new injury. Will give Percocet here and prescription for tramadol.  Toy BakerAnthony T Meenakshi Sazama, MD 09/04/14 2133

## 2014-09-04 NOTE — ED Notes (Signed)
Pt repots not taking anything to help with her back pain at home.  Per pt nothing relieves the pain.

## 2014-09-04 NOTE — ED Notes (Signed)
Per EMS pt has chronic back pain.  Pt was climbing into ambulance to accompany her son last Monday and feels that pain has gotten progressively worse.

## 2014-09-04 NOTE — ED Provider Notes (Signed)
CSN: [id number]     Arrival date & time 09/04/14  2032 History   First MD Initiated Contact with Patient 09/04/14 2045     Chief Complaint  Patient presents with  . Back Pain   [name] [name] is a 73 y.o. female with a history of chronic low back pain, chronic pain, hypertension, GERD, depression and schizophrenia who presents to emergency department complaining of worsening chronic low back pain. Patient reports that she had to climb into an ambulance about 2 weeks ago and reports her chronic low back pain has been worse since then. Patient reports her pain is across her lower back rated 10 out of 10. Pain is worse with movement. Patient walks with a cane at home. Patient was seen in the emergency department on 08/31/2014 and given 15 tramadol. Patient reports her she is out of her tramadol which usually helps. Patient reports her pain is not different just worse. The patient denies fevers, chills, numbness, tingling, loss of bowel or bladder control, saddle anesthesia, loss of coordination, changes to her vision, vomiting, nausea, diarrhea, chest pain, shortness of breath, or palpitations.  (Consider location/radiation/quality/duration/timing/severity/associated sxs/prior Treatment) HPI  Past Medical History  Diagnosis Date  . Depression   . Schizophrenia   . Hyperlipidemia   . Hypertension   . Chronic pain     "over my whole body" (03/30/2013)  . 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 DVT (deep venous thrombosis)     per 01/17/10 d/c summary- "remote hx of dvt"?  . Iron deficiency anemia   . Glaucoma   . Hemorrhoids   . Asthma   . Anxiety   . GERD (gastroesophageal reflux disease)   . Psoriasis 04/29/2012    New onset evaluated by Dr Marylou FlesherWilliam Huang, [facility] 6/[facility]  Rx 0.1% Tacrolimus ointment  . Complication of anesthesia     "because I have sleep apnea" (03/30/2013)  . Heart murmur   . Chest pain, exertional   . Chronic bronchitis   . Sleep  apnea     "waiting on my CPAP" (03/30/2013)  . Pneumonia     "a few times" (03/30/2013)  . Exertional shortness of breath     "& sometimes when laying down" (03/30/2013)  . Daily headache     "last 2 months" (03/30/2013)  . Arthritis     "joints" (03/30/2013)  . Varicose veins of legs   . Psoriasis    Past Surgical History  Procedure Laterality Date  . Spinal fusion      2004  . Carpal tunnel release Bilateral   . Tubal ligation  1973  . Dilation and curettage of uterus  1970's    "once" (03/30/2013)  . 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 Joint replacement    . Vein surgery  left leg   Family History  Problem Relation Age of Onset  . Diabetes Sister   . Colon cancer Brother 40  . Colon cancer Brother    History  Substance Use Topics  . Smoking status: Never Smoker   . Smokeless tobacco: Never Used  . Alcohol Use: No   OB History    No data available     Review of Systems  Constitutional: Negative for fever and chills.  HENT: Negative for congestion, ear pain and sore throat.   Eyes: Negative for pain and visual disturbance.  Respiratory: Negative for cough, shortness of breath and wheezing.   Cardiovascular: Negative for chest pain  and palpitations.  Gastrointestinal: Negative for nausea, vomiting, abdominal pain and diarrhea.  Genitourinary: Negative for dysuria, urgency, hematuria and difficulty urinating.  Musculoskeletal: Positive for back pain. Negative for neck pain and neck stiffness.  Skin: Negative for rash.  Neurological: Negative for dizziness, facial asymmetry, numbness and headaches.  All other systems reviewed and are negative.     Allergies  Other  Home Medications   Prior to Admission medications   Medication Sig Start Date End Date Taking? Authorizing Provider  acetaminophen (TYLENOL) 500 MG tablet Take 1,000 mg by mouth every 6 (six) hours as needed for mild pain (pain).     Historical Provider, MD  albuterol (PROVENTIL HFA;VENTOLIN HFA) 108  (90 BASE) MCG/ACT inhaler Inhale 2 puffs into the lungs every 6 (six) hours as needed for wheezing or shortness of breath (wheezing).    Historical Provider, MD  atenolol (TENORMIN) 100 MG tablet Take 100 mg by mouth daily.    Historical Provider, MD  esomeprazole (NEXIUM) 20 MG capsule Take 20 mg by mouth daily. Pt uses Nexium 24hour OTC product.    Historical Provider, MD  FLUoxetine (PROZAC) 20 MG capsule Take 3 capsules (60 mg total) by mouth daily. 05/26/14   Tyrone Nine, MD  furosemide (LASIX) 20 MG tablet Take 1 tablet (20 mg total) by mouth daily. 07/12/14   Tyrone Nine, MD  gabapentin (NEURONTIN) 300 MG capsule Take 300 mg by mouth daily as needed (pain).    Historical Provider, MD  hydrochlorothiazide (HYDRODIURIL) 25 MG tablet Take 25 mg by mouth daily.    Historical Provider, MD  hydrOXYzine (ATARAX/VISTARIL) 10 MG tablet Take 10 mg by mouth at bedtime as needed for itching (itching).     Historical Provider, MD  lisinopril (PRINIVIL,ZESTRIL) 10 MG tablet Take 10 mg by mouth daily.  05/08/14   Historical Provider, MD  meclizine (ANTIVERT) 25 MG tablet Take 12.5 mg by mouth 3 (three) times daily as needed for dizziness (dizziness).    Historical Provider, MD  montelukast (SINGULAIR) 10 MG tablet Take 1 tablet (10 mg total) by mouth at bedtime. 05/11/14   Tyrone Nine, MD  omeprazole (PRILOSEC) 40 MG capsule Take 1 capsule (40 mg total) by mouth daily. 08/31/14   Arby Barrette, MD  silver sulfADIAZINE (SILVADENE) 1 % cream APPLY 1 APPLICATION TOPICALLY DAILY. 08/06/14   Tyrone Nine, MD  sucralfate (CARAFATE) 1 G tablet Take 1 tablet (1 g total) by mouth 4 (four) times daily. 05/11/14   Tyrone Nine, MD  tacrolimus (PROTOPIC) 0.1 % ointment Apply 1 application topically 3 (three) times daily as needed (for legs). Apply to affected areas daily PRN 03/01/14 03/01/15  Historical Provider, MD  traMADol (ULTRAM) 50 MG tablet Take 1 tablet (50 mg total) by mouth every 6 (six) hours as needed. 09/04/14    Einar Gip Vendela Troung, PA-C  triamcinolone cream (KENALOG) 0.1 % Apply 1 application topically 2 (two) times daily as needed (for legs).     Historical Provider, MD  warfarin (COUMADIN) 5 MG tablet Take 1 tablet (5 mg total) by mouth daily. 01/28/14   Levert Feinstein, MD   BP 131/64 mmHg  Pulse 62  Temp(Src) 98.2 F (36.8 C) (Oral)  Resp 18  SpO2 96%  LMP 11/22/1996 Physical Exam  Constitutional: She is oriented to person, place, and time. She appears well-developed and well-nourished. No distress.  HENT:  Head: Normocephalic and atraumatic.  Right Ear: External ear normal.  Left Ear: External ear normal.  Nose: Nose normal.  Mouth/Throat: Oropharynx is clear and moist. No oropharyngeal exudate.  Eyes: Conjunctivae and EOM are normal. Pupils are equal, round, and reactive to light. Right eye exhibits no discharge. Left eye exhibits no discharge.  Neck: Normal range of motion. Neck supple.  Cardiovascular: Normal rate, regular rhythm, normal heart sounds and intact distal pulses.  Exam reveals no gallop and no friction rub.   No murmur heard. Pulmonary/Chest: Effort normal and breath sounds normal. No respiratory distress. She has no wheezes. She has no rales.  Abdominal: Soft. Bowel sounds are normal. She exhibits no distension and no mass. There is no tenderness. There is no rebound and no guarding.  Musculoskeletal: She exhibits tenderness. She exhibits no edema.  Patient is spontaneously moving all extremities in a coordinated fashion exhibiting good strength. Patient has mild bilateral lower back tenderness to palpation. No midline tenderness. No crepitus or step-offs. No edema or erythema.   Lymphadenopathy:    She has no cervical adenopathy.  Neurological: She is alert and oriented to person, place, and time. No cranial nerve deficit. Coordination normal.  Skin: Skin is warm and dry. No rash noted. She is not diaphoretic. No erythema. No pallor.  Psychiatric: She has a normal  mood and affect. Her behavior is normal.  Nursing note and vitals reviewed.   ED Course  Procedures (including critical care time) Labs Review Labs Reviewed - No data to display  Imaging Review No results found.   EKG Interpretation None      Filed Vitals:   09/04/14 2041 09/04/14 2100 09/04/14 2130 09/04/14 2200  BP: 143/63 127/70 122/60 131/64  Pulse: 67 67 62 62  Temp: 98.8 F (37.1 C)   98.2 F (36.8 C)  TempSrc: Oral   Oral  Resp: 20   18  SpO2: 97% 98% 96% 96%     MDM   Meds given in ED:  Medications  oxyCODONE-acetaminophen (PERCOCET/ROXICET) 5-325 MG per tablet 2 tablet (2 tablets Oral Given 09/04/14 2203)    Discharge Medication List as of 09/04/2014  9:30 PM      Final diagnoses:  Chronic low back pain   [name] SnowballDora C [name] is a 73 y.o. female with a history of chronic low back pain, chronic pain, hypertension, GERD, depression and schizophrenia who presents to emergency department complaining of worsening chronic low back pain. The patient denies fevers, chills, loss of bowel or bladder control, saddle anesthesia, numbness or tingling. Patient is afebrile and nontoxic appearing. After the patient was discussed with and evaluated by Dr. Freida BusmanAllen decided to give 2 Percocets in the ED and discharged with tramadol 50 mg dispensing 30. Advised patient she needed to follow-up with her primary care physician next week for continued pain. Advised the patient to return to the emergency department with new worsening symptoms or new concerns. The patient verbalized understanding and agreement with plan.  This patient was discussed with and evaluated by Dr. Freida BusmanAllen who agrees with assessment and plan.     Lawana ChambersWilliam Duncan Zakhai Meisinger, PA-C 09/05/14 [phone]

## 2014-09-04 NOTE — ED Notes (Signed)
Bed: [id number] Expected date: 09/04/14 Expected time: 8:21 PM Means of arrival: Ambulance Comments: Chronic back pain

## 2014-09-04 NOTE — Discharge Instructions (Signed)

## 2014-09-10 ENCOUNTER — Emergency Department (HOSPITAL_COMMUNITY): Payer: PRIVATE HEALTH INSURANCE

## 2014-09-10 ENCOUNTER — Encounter (HOSPITAL_COMMUNITY): Payer: Self-pay | Admitting: Emergency Medicine

## 2014-09-10 ENCOUNTER — Emergency Department (HOSPITAL_COMMUNITY)
Admission: EM | Admit: 2014-09-10 | Discharge: 2014-09-11 | Disposition: A | Discharge status: Home / Self Care | Payer: PRIVATE HEALTH INSURANCE | Attending: Emergency Medicine | Admitting: Emergency Medicine

## 2014-09-10 DIAGNOSIS — Z87718 Personal history of other specified (corrected) congenital malformations of genitourinary system: Secondary | ICD-10-CM | POA: Diagnosis not present

## 2014-09-10 DIAGNOSIS — Z79899 Other long term (current) drug therapy: Secondary | ICD-10-CM | POA: Insufficient documentation

## 2014-09-10 DIAGNOSIS — Z791 Long term (current) use of non-steroidal anti-inflammatories (NSAID): Secondary | ICD-10-CM | POA: Diagnosis not present

## 2014-09-10 DIAGNOSIS — I10 Essential (primary) hypertension: Secondary | ICD-10-CM | POA: Diagnosis not present

## 2014-09-10 DIAGNOSIS — R509 Fever, unspecified: Secondary | ICD-10-CM | POA: Diagnosis present

## 2014-09-10 DIAGNOSIS — Z872 Personal history of diseases of the skin and subcutaneous tissue: Secondary | ICD-10-CM | POA: Diagnosis not present

## 2014-09-10 DIAGNOSIS — F419 Anxiety disorder, unspecified: Secondary | ICD-10-CM | POA: Insufficient documentation

## 2014-09-10 DIAGNOSIS — G4730 Sleep apnea, unspecified: Secondary | ICD-10-CM | POA: Diagnosis not present

## 2014-09-10 DIAGNOSIS — M1990 Unspecified osteoarthritis, unspecified site: Secondary | ICD-10-CM | POA: Insufficient documentation

## 2014-09-10 DIAGNOSIS — J45909 Unspecified asthma, uncomplicated: Secondary | ICD-10-CM | POA: Diagnosis not present

## 2014-09-10 DIAGNOSIS — F329 Major depressive disorder, single episode, unspecified: Secondary | ICD-10-CM | POA: Diagnosis not present

## 2014-09-10 DIAGNOSIS — R059 Cough, unspecified: Secondary | ICD-10-CM

## 2014-09-10 DIAGNOSIS — I5031 Acute diastolic (congestive) heart failure: Secondary | ICD-10-CM

## 2014-09-10 DIAGNOSIS — Z862 Personal history of diseases of the blood and blood-forming organs and certain disorders involving the immune mechanism: Secondary | ICD-10-CM | POA: Insufficient documentation

## 2014-09-10 DIAGNOSIS — K219 Gastro-esophageal reflux disease without esophagitis: Secondary | ICD-10-CM | POA: Diagnosis not present

## 2014-09-10 DIAGNOSIS — Z7951 Long term (current) use of inhaled steroids: Secondary | ICD-10-CM | POA: Diagnosis not present

## 2014-09-10 DIAGNOSIS — G8929 Other chronic pain: Secondary | ICD-10-CM | POA: Insufficient documentation

## 2014-09-10 DIAGNOSIS — R011 Cardiac murmur, unspecified: Secondary | ICD-10-CM | POA: Diagnosis not present

## 2014-09-10 DIAGNOSIS — J209 Acute bronchitis, unspecified: Secondary | ICD-10-CM | POA: Insufficient documentation

## 2014-09-10 DIAGNOSIS — Z86711 Personal history of pulmonary embolism: Secondary | ICD-10-CM | POA: Diagnosis not present

## 2014-09-10 DIAGNOSIS — Z8701 Personal history of pneumonia (recurrent): Secondary | ICD-10-CM | POA: Diagnosis not present

## 2014-09-10 DIAGNOSIS — R05 Cough: Secondary | ICD-10-CM

## 2014-09-10 DIAGNOSIS — J40 Bronchitis, not specified as acute or chronic: Secondary | ICD-10-CM

## 2014-09-10 LAB — CBC WITH DIFFERENTIAL/PLATELET
BASOS PCT: 0 % (ref 0–1)
Basophils Absolute: 0 10*3/uL (ref 0.0–0.1)
EOS PCT: 3 % (ref 0–5)
Eosinophils Absolute: 0.2 10*3/uL (ref 0.0–0.7)
HEMATOCRIT: 32.4 % — AB (ref 36.0–46.0)
HEMOGLOBIN: 10.3 g/dL — AB (ref 12.0–15.0)
LYMPHS ABS: 1.3 10*3/uL (ref 0.7–4.0)
Lymphocytes Relative: 16 % (ref 12–46)
MCH: 29.9 pg (ref 26.0–34.0)
MCHC: 31.8 g/dL (ref 30.0–36.0)
MCV: 94.2 fL (ref 78.0–100.0)
MONO ABS: 0.4 10*3/uL (ref 0.1–1.0)
Monocytes Relative: 5 % (ref 3–12)
Neutro Abs: 6.2 10*3/uL (ref 1.7–7.7)
Neutrophils Relative %: 76 % (ref 43–77)
Platelets: 241 10*3/uL (ref 150–400)
RBC: 3.44 MIL/uL — AB (ref 3.87–5.11)
RDW: 15.6 % — ABNORMAL HIGH (ref 11.5–15.5)
WBC: 8.1 10*3/uL (ref 4.0–10.5)

## 2014-09-10 LAB — BASIC METABOLIC PANEL
Anion gap: 10 (ref 5–15)
BUN: 8 mg/dL (ref 6–23)
CALCIUM: 9.2 mg/dL (ref 8.4–10.5)
CO2: 31 mEq/L (ref 19–32)
CREATININE: 0.92 mg/dL (ref 0.50–1.10)
Chloride: 97 mEq/L (ref 96–112)
GFR, EST AFRICAN AMERICAN: 71 mL/min — AB (ref >90)
GFR, EST NON AFRICAN AMERICAN: 61 mL/min — AB (ref >90)
GLUCOSE: 112 mg/dL — AB (ref 70–99)
POTASSIUM: 3 meq/L — AB (ref 3.7–5.3)
Sodium: 138 mEq/L (ref 137–147)

## 2014-09-10 LAB — TROPONIN I

## 2014-09-10 MED ORDER — ALBUTEROL SULFATE (2.5 MG/3ML) 0.083% IN NEBU
5.0000 mg | INHALATION_SOLUTION | Freq: Once | RESPIRATORY_TRACT | Status: AC
  Administered 2014-09-10: 5 mg via RESPIRATORY_TRACT
  Filled 2014-09-10: qty 6

## 2014-09-10 MED ORDER — IPRATROPIUM BROMIDE 0.02 % IN SOLN
0.5000 mg | Freq: Once | RESPIRATORY_TRACT | Status: AC
  Administered 2014-09-10: 0.5 mg via RESPIRATORY_TRACT
  Filled 2014-09-10: qty 2.5

## 2014-09-10 NOTE — ED Provider Notes (Signed)
CSN: [id number]     Arrival date & time 09/10/14  2149 History   First MD Initiated Contact with Patient 09/10/14 2310     Chief Complaint  Patient presents with  . Cough  . Fever     (Consider location/radiation/quality/duration/timing/severity/associated sxs/prior Treatment) HPI Comments: PT with hx of asthma, chronic low back pain, hypertension, GERD, and hx of DVT comes in with cc of cough. Pt has had a cough now for close to a month. Cough is constant, productive, with clear phlegm. There is no wheezing. She has some discomfort in the front of her chest with cough and also has some DIB. There is no n/v/f/c. Pt also has runny nose and teary eyes. PT has been ifnoring her sx as she has been extremely busy taking care of her family, and finally couldn't take her symptoms any more. Pt denies any pattern to the cough, any specific time that it's worth, no night sweats, not a smoker. Pt has some orthopnea and paroxysmal nocturnal dyspnea like symptoms. PT also has exertional dyspnea. No chest pain with exertion. No hx of CAD, + diastolic CHF. No new leg swelling.  Patient is a 73 y.o. female presenting with cough and fever. The history is provided by the patient.  Cough Associated symptoms: eye discharge and shortness of breath   Associated symptoms: no chest pain, no chills, no fever, no headaches and no wheezing   Fever Associated symptoms: congestion and cough   Associated symptoms: no chest pain, no chills, no confusion, no diarrhea, no dysuria, no headaches, no nausea and no vomiting     Past Medical History  Diagnosis Date  . Depression   . Schizophrenia   . Hyperlipidemia   . Hypertension   . Chronic pain     "over my whole body" (03/30/2013)  . 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 DVT (deep venous thrombosis)     per 01/17/10 d/c summary- "remote hx of dvt"?  . Iron deficiency anemia   . Glaucoma   . Hemorrhoids   . Asthma   . Anxiety   . GERD  (gastroesophageal reflux disease)   . Psoriasis 04/29/2012    New onset evaluated by Dr Marylou FlesherWilliam Huang, [facility]at[facility]  Rx 0.1% Tacrolimus ointment  . Complication of anesthesia     "because I have sleep apnea" (03/30/2013)  . Heart murmur   . Chest pain, exertional   . Chronic bronchitis   . Sleep apnea     "waiting on my CPAP" (03/30/2013)  . Pneumonia     "a few times" (03/30/2013)  . Exertional shortness of breath     "& sometimes when laying down" (03/30/2013)  . Daily headache     "last 2 months" (03/30/2013)  . Arthritis     "joints" (03/30/2013)  . Varicose veins of legs   . Psoriasis    Past Surgical History  Procedure Laterality Date  . Spinal fusion      2004  . Carpal tunnel release Bilateral   . Tubal ligation  1973  . Dilation and curettage of uterus  1970's    "once" (03/30/2013)  . 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 Joint replacement    . Vein surgery  left leg   Family History  Problem Relation Age of Onset  . Diabetes Sister   . Colon cancer Brother 40  . Colon cancer Brother    History  Substance Use Topics  .  Smoking status: Never Smoker   . Smokeless tobacco: Never Used  . Alcohol Use: No   OB History    No data available     Review of Systems  Constitutional: Negative for fever, chills and activity change.  HENT: Positive for congestion. Negative for facial swelling.   Eyes: Positive for discharge. Negative for visual disturbance.  Respiratory: Positive for cough, chest tightness and shortness of breath. Negative for wheezing.   Cardiovascular: Negative for chest pain and leg swelling.  Gastrointestinal: Negative for nausea, vomiting, abdominal pain, diarrhea, constipation, blood in stool and abdominal distention.  Genitourinary: Negative for dysuria, hematuria and difficulty urinating.  Musculoskeletal: Negative for neck pain.  Skin: Negative for color change.  Neurological: Negative for speech difficulty and headaches.   Hematological: Does not bruise/bleed easily.  Psychiatric/Behavioral: Negative for confusion.      Allergies  Other  Home Medications   Prior to Admission medications   Medication Sig Start Date End Date Taking? Authorizing Provider  acetaminophen (TYLENOL) 500 MG tablet Take 1,000 mg by mouth every 6 (six) hours as needed for mild pain (pain).    Yes Historical Provider, MD  albuterol (PROVENTIL HFA;VENTOLIN HFA) 108 (90 BASE) MCG/ACT inhaler Inhale 2 puffs into the lungs every 6 (six) hours as needed for wheezing or shortness of breath (wheezing).   Yes Historical Provider, MD  atenolol (TENORMIN) 100 MG tablet Take 100 mg by mouth daily.   Yes Historical Provider, MD  FLUoxetine (PROZAC) 20 MG capsule Take 3 capsules (60 mg total) by mouth daily. 05/26/14  Yes Tyrone Nine, MD  fluticasone (FLOVENT HFA) 220 MCG/ACT inhaler Inhale 2 puffs into the lungs 2 (two) times daily.   Yes Historical Provider, MD  furosemide (LASIX) 20 MG tablet TAKE 1 TABLET BY MOUTH EVERY DAY 09/07/14  Yes Tyrone Nine, MD  hydrochlorothiazide (HYDRODIURIL) 25 MG tablet Take 25 mg by mouth daily.   Yes Historical Provider, MD  hydrOXYzine (ATARAX/VISTARIL) 10 MG tablet Take 10 mg by mouth at bedtime as needed for itching (itching).    Yes Historical Provider, MD  lisinopril (PRINIVIL,ZESTRIL) 10 MG tablet Take 10 mg by mouth daily.  05/08/14  Yes Historical Provider, MD  meclizine (ANTIVERT) 25 MG tablet Take 12.5 mg by mouth 3 (three) times daily as needed for dizziness (dizziness).   Yes Historical Provider, MD  montelukast (SINGULAIR) 10 MG tablet Take 1 tablet (10 mg total) by mouth at bedtime. 05/11/14  Yes Tyrone Nine, MD  naproxen sodium (ANAPROX) 220 MG tablet Take 660 mg by mouth 2 (two) times daily.   Yes Historical Provider, MD  omeprazole (PRILOSEC) 40 MG capsule Take 1 capsule (40 mg total) by mouth daily. 08/31/14  Yes Arby Barrette, MD  silver sulfADIAZINE (SILVADENE) 1 % cream APPLY 1 APPLICATION  TOPICALLY DAILY. 08/06/14  Yes Tyrone Nine, MD  sucralfate (CARAFATE) 1 G tablet Take 1 tablet (1 g total) by mouth 4 (four) times daily. 05/11/14  Yes Tyrone Nine, MD  tacrolimus (PROTOPIC) 0.1 % ointment Apply 1 application topically 3 (three) times daily as needed (for legs). Apply to affected areas daily PRN 03/01/14 03/01/15 Yes Historical Provider, MD  triamcinolone cream (KENALOG) 0.1 % Apply 1 application topically 2 (two) times daily as needed (for legs).    Yes Historical Provider, MD  warfarin (COUMADIN) 5 MG tablet Take 1 tablet (5 mg total) by mouth daily. 01/28/14  Yes Levert Feinstein, MD  azithromycin (ZITHROMAX) 250 MG tablet Take 1  tablet (250 mg total) by mouth daily. 1 tablet q daily starting 09/12/14 09/11/14   Derwood Kaplan, MD  predniSONE (DELTASONE) 50 MG tablet Take 1 tablet (50 mg total) by mouth daily. 09/11/14   Derwood Kaplan, MD  traMADol (ULTRAM) 50 MG tablet Take 1 tablet (50 mg total) by mouth every 6 (six) hours as needed. 09/04/14   Einar Gip Dansie, PA-C   BP 137/81 mmHg  Pulse 65  Temp(Src) 98.7 F (37.1 C) (Oral)  Resp 17  SpO2 95%  LMP 11/22/1996 Physical Exam  Constitutional: She is oriented to person, place, and time. She appears well-developed and well-nourished.  HENT:  Head: Normocephalic and atraumatic.  Eyes: EOM are normal. Pupils are equal, round, and reactive to light.  Neck: Neck supple. No JVD present.  Cardiovascular: Normal rate, regular rhythm and normal heart sounds.   Pulmonary/Chest: Effort normal. No respiratory distress. She has wheezes.  Diffuse rhonchi  Abdominal: Soft. She exhibits no distension. There is no tenderness. There is no rebound and no guarding.  Neurological: She is alert and oriented to person, place, and time.  Skin: Skin is warm and dry.  Nursing note and vitals reviewed.   ED Course  Procedures (including critical care time) Labs Review Labs Reviewed  CBC WITH DIFFERENTIAL - Abnormal; Notable for the  following:    RBC 3.44 (*)    Hemoglobin 10.3 (*)    HCT 32.4 (*)    RDW 15.6 (*)    All other components within normal limits  BASIC METABOLIC PANEL - Abnormal; Notable for the following:    Potassium 3.0 (*)    Glucose, Bld 112 (*)    GFR calc non Af Amer 61 (*)    GFR calc Af Amer 71 (*)    All other components within normal limits  PRO B NATRIURETIC PEPTIDE - Abnormal; Notable for the following:    Pro B Natriuretic peptide (BNP) 273.9 (*)    All other components within normal limits  TROPONIN I    Imaging Review Dg Chest 2 View  09/10/2014   CLINICAL DATA:  Acute onset of productive cough, with fever, chills and generalized weakness for 2 weeks. Initial encounter.  EXAM: CHEST  2 VIEW  COMPARISON:  Chest radiograph performed 07/05/2014  FINDINGS: The lungs are well-aerated. Mild basilar opacity on the lateral view could reflect mild pneumonia. There is no evidence of pleural effusion or pneumothorax.  The heart is mildly enlarged. Mild vascular congestion is noted. No acute osseous abnormalities are seen.  IMPRESSION: Mild basilar opacity on the lateral view could reflect mild pneumonia. Mild vascular congestion and mild cardiomegaly noted.   Electronically Signed   By: Roanna Raider M.D.   On: 09/10/2014 22:55     EKG Interpretation   Date/Time:  Saturday September 11 2014 00:06:26 EST Ventricular Rate:  61 PR Interval:  191 QRS Duration: 84 QT Interval:  432 QTC Calculation: 435 R Axis:   38 Text Interpretation:  Sinus rhythm negative deflection in lead III,  possibly q wave No acute findings Confirmed by Holland Nickson, MD, Loki Wuthrich [phone])  on 09/11/2014 1:11:58 AM      MDM   Final diagnoses:  Bronchitis  Acute diastolic CHF (congestive heart failure)    Pt comes in with cc of dib and cough. She has hx of asthma, and diastolic CHF. Her sx are consistent with mild CHF like picture - however, she reports subjective fever at home with weakness and had some wheezing on  exam,  the latter could be CHF or bronchitis.  Pt has had the sx for a month now. She is ambulating well. 0 SIRS here and there is no hypoxia.  Pt will get double her lasix dose here. She has been advised to see her PCP, and i have called Family practice myself to ensure they call and set up an appointment for her.  Pt has mild pulm congestion, i dont think she needs to be admitted. Will tx as bronchitis and CHF to be safe. Pt is in agreement with the plan, states that there is a lot of stress at home, and she rather be at home.  Derwood KaplanAnkit Ilham Roughton, MD 09/11/14 [phone]

## 2014-09-10 NOTE — ED Notes (Signed)
Bed: WA03 Expected date:  Expected time:  Means of arrival:  Comments: EMS shob

## 2014-09-10 NOTE — ED Notes (Signed)
Per EMS: Pt c/o productive cough with clear mucus, fever, chills, and generalized weakness x 2 weeks. Pt states congestion caused N/V onset today after pt given neb tx, emesis x 1. No wheezing heard.

## 2014-09-11 DIAGNOSIS — J209 Acute bronchitis, unspecified: Secondary | ICD-10-CM | POA: Diagnosis not present

## 2014-09-11 LAB — PRO B NATRIURETIC PEPTIDE: Pro B Natriuretic peptide (BNP): 273.9 pg/mL — ABNORMAL HIGH (ref 0–125)

## 2014-09-11 MED ORDER — FUROSEMIDE 40 MG PO TABS
40.0000 mg | ORAL_TABLET | Freq: Once | ORAL | Status: AC
  Administered 2014-09-11: 40 mg via ORAL
  Filled 2014-09-11: qty 1

## 2014-09-11 MED ORDER — AZITHROMYCIN 250 MG PO TABS
500.0000 mg | ORAL_TABLET | Freq: Every day | ORAL | Status: DC
  Administered 2014-09-11: 500 mg via ORAL
  Filled 2014-09-11: qty 2

## 2014-09-11 MED ORDER — POTASSIUM CHLORIDE CRYS ER 20 MEQ PO TBCR
60.0000 meq | EXTENDED_RELEASE_TABLET | Freq: Once | ORAL | Status: AC
  Administered 2014-09-11: 60 meq via ORAL
  Filled 2014-09-11: qty 3

## 2014-09-11 MED ORDER — PREDNISONE 50 MG PO TABS: 50.0000 mg | ORAL_TABLET | Freq: Every day | ORAL | Status: DC

## 2014-09-11 MED ORDER — PREDNISONE 20 MG PO TABS
60.0000 mg | ORAL_TABLET | Freq: Once | ORAL | Status: AC
  Administered 2014-09-11: 60 mg via ORAL
  Filled 2014-09-11: qty 3

## 2014-09-11 MED ORDER — GUAIFENESIN-CODEINE 100-10 MG/5ML PO SOLN: 5.0000 mL | Freq: Four times a day (QID) | ORAL | Status: DC | PRN

## 2014-09-11 MED ORDER — AZITHROMYCIN 250 MG PO TABS: 250.0000 mg | ORAL_TABLET | Freq: Every day | ORAL | Status: DC

## 2014-09-11 NOTE — Discharge Instructions (Signed)
Please take the medicine provided. It appears that you have bronchitis and also mild fluid in your lungs due to your heart.  See your doctors early next week. Return to the ER if your symptoms get worse.  Heart Failure Heart failure is a condition in which the heart has trouble pumping blood. This means your heart does not pump blood efficiently for your body to work well. In some cases of heart failure, fluid may back up into your lungs or you may have swelling (edema) in your lower legs. Heart failure is usually a long-term (chronic) condition. It is important for you to take good care of yourself and follow your health care provider's treatment plan. CAUSES  Some health conditions can cause heart failure. Those health conditions include:  High blood pressure (hypertension). Hypertension causes the heart muscle to work harder than normal. When pressure in the blood vessels is high, the heart needs to pump (contract) with more force in order to circulate blood throughout the body. High blood pressure eventually causes the heart to become stiff and weak.  Coronary artery disease (CAD). CAD is the buildup of cholesterol and fat (plaque) in the arteries of the heart. The blockage in the arteries deprives the heart muscle of oxygen and blood. This can cause chest pain and may lead to a heart attack. High blood pressure can also contribute to CAD.  Heart attack (myocardial infarction). A heart attack occurs when one or more arteries in the heart become blocked. The loss of oxygen damages the muscle tissue of the heart. When this happens, part of the heart muscle dies. The injured tissue does not contract as well and weakens the heart's ability to pump blood.  Abnormal heart valves. When the heart valves do not open and close properly, it can cause heart failure. This makes the heart muscle pump harder to keep the blood flowing.  Heart muscle disease (cardiomyopathy or myocarditis). Heart muscle  disease is damage to the heart muscle from a variety of causes. These can include drug or alcohol abuse, infections, or unknown reasons. These can increase the risk of heart failure.  Lung disease. Lung disease makes the heart work harder because the lungs do not work properly. This can cause a strain on the heart, leading it to fail.  Diabetes. Diabetes increases the risk of heart failure. High blood sugar contributes to high fat (lipid) levels in the blood. Diabetes can also cause slow damage to tiny blood vessels that carry important nutrients to the heart muscle. When the heart does not get enough oxygen and food, it can cause the heart to become weak and stiff. This leads to a heart that does not contract efficiently.  Other conditions can contribute to heart failure. These include abnormal heart rhythms, thyroid problems, and low blood counts (anemia). Certain unhealthy behaviors can increase the risk of heart failure, including:  Being overweight.  Smoking or chewing tobacco.  Eating foods high in fat and cholesterol.  Abusing illicit drugs or alcohol.  Lacking physical activity. SYMPTOMS  Heart failure symptoms may vary and can be hard to detect. Symptoms may include:  Shortness of breath with activity, such as climbing stairs.  Persistent cough.  Swelling of the feet, ankles, legs, or abdomen.  Unexplained weight gain.  Difficulty breathing when lying flat (orthopnea).  Waking from sleep because of the need to sit up and get more air.  Rapid heartbeat.  Fatigue and loss of energy.  Feeling light-headed, dizzy, or close to fainting.  Loss of appetite.  Nausea.  Increased urination during the night (nocturia). DIAGNOSIS  A diagnosis of heart failure is based on your history, symptoms, physical examination, and diagnostic tests. Diagnostic tests for heart failure may include:  Echocardiography.  Electrocardiography.  Chest X-ray.  Blood tests.  Exercise  stress test.  Cardiac angiography.  Radionuclide scans. TREATMENT  Treatment is aimed at managing the symptoms of heart failure. Medicines, behavioral changes, or surgical intervention may be necessary to treat heart failure.  Medicines to help treat heart failure may include:  Angiotensin-converting enzyme (ACE) inhibitors. This type of medicine blocks the effects of a blood protein called angiotensin-converting enzyme. ACE inhibitors relax (dilate) the blood vessels and help lower blood pressure.  Angiotensin receptor blockers (ARBs). This type of medicine blocks the actions of a blood protein called angiotensin. Angiotensin receptor blockers dilate the blood vessels and help lower blood pressure.  Water pills (diuretics). Diuretics cause the kidneys to remove salt and water from the blood. The extra fluid is removed through urination. This loss of extra fluid lowers the volume of blood the heart pumps.  Beta blockers. These prevent the heart from beating too fast and improve heart muscle strength.  Digitalis. This increases the force of the heartbeat.  Healthy behavior changes include:  Obtaining and maintaining a healthy weight.  Stopping smoking or chewing tobacco.  Eating heart-healthy foods.  Limiting or avoiding alcohol.  Stopping illicit drug use.  Physical activity as directed by your health care provider.  Surgical treatment for heart failure may include:  A procedure to open blocked arteries, repair damaged heart valves, or remove damaged heart muscle tissue.  A pacemaker to improve heart muscle function and control certain abnormal heart rhythms.  An internal cardioverter defibrillator to treat certain serious abnormal heart rhythms.  A left ventricular assist device (LVAD) to assist the pumping ability of the heart. HOME CARE INSTRUCTIONS   Take medicines only as directed by your health care provider. Medicines are important in reducing the workload of your  heart, slowing the progression of heart failure, and improving your symptoms.  Do not stop taking your medicine unless directed by your health care provider.  Do not skip any dose of medicine.  Refill your prescriptions before you run out of medicine. Your medicines are needed every day.  Engage in moderate physical activity if directed by your health care provider. Moderate physical activity can benefit some people. The elderly and people with severe heart failure should consult with a health care provider for physical activity recommendations.  Eat heart-healthy foods. Food choices should be free of trans fat and low in saturated fat, cholesterol, and salt (sodium). Healthy choices include fresh or frozen fruits and vegetables, fish, lean meats, legumes, fat-free or low-fat dairy products, and whole grain or high fiber foods. Talk to a dietitian to learn more about heart-healthy foods.  Limit sodium if directed by your health care provider. Sodium restriction may reduce symptoms of heart failure in some people. Talk to a dietitian to learn more about heart-healthy seasonings.  Use healthy cooking methods. Healthy cooking methods include roasting, grilling, broiling, baking, poaching, steaming, or stir-frying. Talk to a dietitian to learn more about healthy cooking methods.  Limit fluids if directed by your health care provider. Fluid restriction may reduce symptoms of heart failure in some people.  Weigh yourself every day. Daily weights are important in the early recognition of excess fluid. You should weigh yourself every morning after you urinate and before you eat  breakfast. Wear the same amount of clothing each time you weigh yourself. Record your daily weight. Provide your health care provider with your weight record.  Monitor and record your blood pressure if directed by your health care provider.  Check your pulse if directed by your health care provider.  Lose weight if directed by  your health care provider. Weight loss may reduce symptoms of heart failure in some people.  Stop smoking or chewing tobacco. Nicotine makes your heart work harder by causing your blood vessels to constrict. Do not use nicotine gum or patches before talking to your health care provider.  Keep all follow-up visits as directed by your health care provider. This is important.  Limit alcohol intake to no more than 1 drink per day for nonpregnant women and 2 drinks per day for men. One drink equals 12 ounces of beer, 5 ounces of wine, or 1 ounces of hard liquor. Drinking more than that is harmful to your heart. Tell your health care provider if you drink alcohol several times a week. Talk with your health care provider about whether alcohol is safe for you. If your heart has already been damaged by alcohol or you have severe heart failure, drinking alcohol should be stopped completely.  Stop illicit drug use.  Stay up-to-date with immunizations. It is especially important to prevent respiratory infections through current pneumococcal and influenza immunizations.  Manage other health conditions such as hypertension, diabetes, thyroid disease, or abnormal heart rhythms as directed by your health care provider.  Learn to manage stress.  Plan rest periods when fatigued.  Learn strategies to manage high temperatures. If the weather is extremely hot:  Avoid vigorous physical activity.  Use air conditioning or fans or seek a cooler location.  Avoid caffeine and alcohol.  Wear loose-fitting, lightweight, and light-colored clothing.  Learn strategies to manage cold temperatures. If the weather is extremely cold:  Avoid vigorous physical activity.  Layer clothes.  Wear mittens or gloves, a hat, and a scarf when going outside.  Avoid alcohol.  Obtain ongoing education and support as needed.  Participate in or seek rehabilitation as needed to maintain or improve independence and quality of  life. SEEK MEDICAL CARE IF:   Your weight increases by 03 lb/1.4 kg in 1 day or 05 lb/2.3 kg in a week.  You have increasing shortness of breath that is unusual for you.  You are unable to participate in your usual physical activities.  You tire easily.  You cough more than normal, especially with physical activity.  You have any or more swelling in areas such as your hands, feet, ankles, or abdomen.  You are unable to sleep because it is hard to breathe.  You feel like your heart is beating fast (palpitations).  You become dizzy or light-headed upon standing up. SEEK IMMEDIATE MEDICAL CARE IF:   You have difficulty breathing.  There is a change in mental status such as decreased alertness or difficulty with concentration.  You have a pain or discomfort in your chest.  You have an episode of fainting (syncope). MAKE SURE YOU:   Understand these instructions.  Will watch your condition.  Will get help right away if you are not doing well or get worse. Document Released: 09/10/2005 Document Revised: 01/25/2014 Document Reviewed: 10/10/2012 [facility] Patient Information 2015 [place], [place]. This information is not intended to replace advice given to you by your health care provider. Make sure you discuss any questions you have with your health care  provider. Acute Bronchitis Bronchitis is inflammation of the airways that extend from the windpipe into the lungs (bronchi). The inflammation often causes mucus to develop. This leads to a cough, which is the most common symptom of bronchitis.  In acute bronchitis, the condition usually develops suddenly and goes away over time, usually in a couple weeks. Smoking, allergies, and asthma can make bronchitis worse. Repeated episodes of bronchitis may cause further lung problems.  CAUSES Acute bronchitis is most often caused by the same virus that causes a cold. The virus can spread from person to person (contagious) through coughing,  sneezing, and touching contaminated objects. SIGNS AND SYMPTOMS   Cough.   Fever.   Coughing up mucus.   Body aches.   Chest congestion.   Chills.   Shortness of breath.   Sore throat.  DIAGNOSIS  Acute bronchitis is usually diagnosed through a physical exam. Your health care provider will also ask you questions about your medical history. Tests, such as chest X-rays, are sometimes done to rule out other conditions.  TREATMENT  Acute bronchitis usually goes away in a couple weeks. Oftentimes, no medical treatment is necessary. Medicines are sometimes given for relief of fever or cough. Antibiotic medicines are usually not needed but may be prescribed in certain situations. In some cases, an inhaler may be recommended to help reduce shortness of breath and control the cough. A cool mist vaporizer may also be used to help thin bronchial secretions and make it easier to clear the chest.  HOME CARE INSTRUCTIONS  Get plenty of rest.   Drink enough fluids to keep your urine clear or pale yellow (unless you have a medical condition that requires fluid restriction). Increasing fluids may help thin your respiratory secretions (sputum) and reduce chest congestion, and it will prevent dehydration.   Take medicines only as directed by your health care provider.  If you were prescribed an antibiotic medicine, finish it all even if you start to feel better.  Avoid smoking and secondhand smoke. Exposure to cigarette smoke or irritating chemicals will make bronchitis worse. If you are a smoker, consider using nicotine gum or skin patches to help control withdrawal symptoms. Quitting smoking will help your lungs heal faster.   Reduce the chances of another bout of acute bronchitis by washing your hands frequently, avoiding people with cold symptoms, and trying not to touch your hands to your mouth, nose, or eyes.   Keep all follow-up visits as directed by your health care provider.   SEEK MEDICAL CARE IF: Your symptoms do not improve after 1 week of treatment.  SEEK IMMEDIATE MEDICAL CARE IF:  You develop an increased fever or chills.   You have chest pain.   You have severe shortness of breath.  You have bloody sputum.   You develop dehydration.  You faint or repeatedly feel like you are going to pass out.  You develop repeated vomiting.  You develop a severe headache. MAKE SURE YOU:   Understand these instructions.  Will watch your condition.  Will get help right away if you are not doing well or get worse. Document Released: 10/18/2004 Document Revised: 01/25/2014 Document Reviewed: 03/03/2013 [facility] Patient Information 2015 [place], [place]. This information is not intended to replace advice given to you by your health care provider. Make sure you discuss any questions you have with your health care provider.

## 2014-09-11 NOTE — ED Notes (Signed)
Pt walking with SpO2=95% on average (93-96%)

## 2014-09-14 ENCOUNTER — Ambulatory Visit: Payer: PRIVATE HEALTH INSURANCE | Admitting: Family Medicine

## 2014-09-20 ENCOUNTER — Emergency Department (HOSPITAL_COMMUNITY)
Admission: EM | Admit: 2014-09-20 | Discharge: 2014-09-20 | Disposition: A | Discharge status: Home / Self Care | Payer: PRIVATE HEALTH INSURANCE | Attending: Emergency Medicine | Admitting: Emergency Medicine

## 2014-09-20 ENCOUNTER — Encounter (HOSPITAL_COMMUNITY): Payer: Self-pay

## 2014-09-20 DIAGNOSIS — Z8701 Personal history of pneumonia (recurrent): Secondary | ICD-10-CM | POA: Diagnosis not present

## 2014-09-20 DIAGNOSIS — Z7901 Long term (current) use of anticoagulants: Secondary | ICD-10-CM | POA: Diagnosis not present

## 2014-09-20 DIAGNOSIS — Z9981 Dependence on supplemental oxygen: Secondary | ICD-10-CM | POA: Insufficient documentation

## 2014-09-20 DIAGNOSIS — Z86711 Personal history of pulmonary embolism: Secondary | ICD-10-CM | POA: Diagnosis not present

## 2014-09-20 DIAGNOSIS — F419 Anxiety disorder, unspecified: Secondary | ICD-10-CM | POA: Diagnosis not present

## 2014-09-20 DIAGNOSIS — M2550 Pain in unspecified joint: Secondary | ICD-10-CM

## 2014-09-20 DIAGNOSIS — R011 Cardiac murmur, unspecified: Secondary | ICD-10-CM | POA: Insufficient documentation

## 2014-09-20 DIAGNOSIS — J45909 Unspecified asthma, uncomplicated: Secondary | ICD-10-CM | POA: Insufficient documentation

## 2014-09-20 DIAGNOSIS — H409 Unspecified glaucoma: Secondary | ICD-10-CM | POA: Diagnosis not present

## 2014-09-20 DIAGNOSIS — I10 Essential (primary) hypertension: Secondary | ICD-10-CM | POA: Insufficient documentation

## 2014-09-20 DIAGNOSIS — Z791 Long term (current) use of non-steroidal anti-inflammatories (NSAID): Secondary | ICD-10-CM | POA: Insufficient documentation

## 2014-09-20 DIAGNOSIS — G4730 Sleep apnea, unspecified: Secondary | ICD-10-CM | POA: Diagnosis not present

## 2014-09-20 DIAGNOSIS — R52 Pain, unspecified: Secondary | ICD-10-CM | POA: Diagnosis present

## 2014-09-20 DIAGNOSIS — G8929 Other chronic pain: Secondary | ICD-10-CM | POA: Diagnosis not present

## 2014-09-20 DIAGNOSIS — Z86718 Personal history of other venous thrombosis and embolism: Secondary | ICD-10-CM | POA: Diagnosis not present

## 2014-09-20 DIAGNOSIS — K219 Gastro-esophageal reflux disease without esophagitis: Secondary | ICD-10-CM | POA: Diagnosis not present

## 2014-09-20 DIAGNOSIS — Z872 Personal history of diseases of the skin and subcutaneous tissue: Secondary | ICD-10-CM | POA: Insufficient documentation

## 2014-09-20 DIAGNOSIS — Z79899 Other long term (current) drug therapy: Secondary | ICD-10-CM | POA: Diagnosis not present

## 2014-09-20 DIAGNOSIS — M25561 Pain in right knee: Secondary | ICD-10-CM | POA: Diagnosis not present

## 2014-09-20 DIAGNOSIS — Z7952 Long term (current) use of systemic steroids: Secondary | ICD-10-CM | POA: Insufficient documentation

## 2014-09-20 DIAGNOSIS — F329 Major depressive disorder, single episode, unspecified: Secondary | ICD-10-CM | POA: Diagnosis not present

## 2014-09-20 DIAGNOSIS — Z862 Personal history of diseases of the blood and blood-forming organs and certain disorders involving the immune mechanism: Secondary | ICD-10-CM | POA: Insufficient documentation

## 2014-09-20 DIAGNOSIS — M1990 Unspecified osteoarthritis, unspecified site: Secondary | ICD-10-CM | POA: Diagnosis not present

## 2014-09-20 MED ORDER — TRAMADOL HCL 50 MG PO TABS
50.0000 mg | ORAL_TABLET | Freq: Once | ORAL | Status: AC
  Administered 2014-09-20: 50 mg via ORAL
  Filled 2014-09-20: qty 1

## 2014-09-20 MED ORDER — TRAMADOL HCL 50 MG PO TABS: 50.0000 mg | ORAL_TABLET | Freq: Four times a day (QID) | ORAL | Status: DC | PRN

## 2014-09-20 NOTE — ED Notes (Signed)
Per pt, pain in wrist, back and knees.  Worse with rain.  Pt generally takes meds for the pain.  Hasn't been able to go to MD for meds.

## 2014-09-20 NOTE — ED Provider Notes (Signed)
CSN: [id number]     Arrival date & time 09/20/14  1545 History   First MD Initiated Contact with Patient 09/20/14 1752     Chief Complaint  Patient presents with  . Generalized Body Aches     (Consider location/radiation/quality/duration/timing/severity/associated sxs/prior Treatment) Patient is a 73 y.o. female presenting with knee pain. The history is provided by the patient.  Knee Pain Location:  Knee Lower extremity injury: prior knee replacement.   Knee location:  R knee Pain details:    Quality:  Aching   Radiates to:  Does not radiate   Severity:  Mild   Onset quality:  Gradual   Timing:  Constant   Progression:  Worsening Chronicity:  Chronic Dislocation: no   Relieved by:  NSAIDs Exacerbated by: change in weather. Ineffective treatments:  NSAIDs Associated symptoms: no fever, no neck pain, no numbness, no swelling and no tingling    Wrist Pain Location: L wrist Lower extremity injury: prior wrist surgery  Knee location:  L wrist Pain details:    Quality:  Aching   Radiates to:  Does not radiate   Severity:  Mild   Onset quality:  Gradual   Timing:  Constant   Progression:  Worsening Chronicity:  Chronic Dislocation: no   Relieved by:  NSAIDs Exacerbated by: change in weather. Ineffective treatments:  NSAIDs Associated symptoms: no fever, no neck pain, no numbness, no swelling and no tingling    Lower Back Pain Location:  L-spine Lower extremity injury: prior lumbar spine surgery   Knee location:  RL-spine Pain details:    Quality:  Aching   Radiates to:  Does not radiate   Severity:  Mild   Onset quality:  Gradual   Timing:  Constant   Progression:  Worsening Chronicity:  Chronic Dislocation: no   Relieved by:  NSAIDs Exacerbated by: change in weather. Ineffective treatments:  NSAIDs Associated symptoms: no fever, no neck pain, no numbness, no swelling and no tingling   Past Medical History  Diagnosis Date  . Depression   . Schizophrenia    . Hyperlipidemia   . Hypertension   . Chronic pain     "over my whole body" (03/30/2013)  . 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 DVT (deep venous thrombosis)     per 01/17/10 d/c summary- "remote hx of dvt"?  . Iron deficiency anemia   . Glaucoma   . Hemorrhoids   . Asthma   . Anxiety   . GERD (gastroesophageal reflux disease)   . Psoriasis 04/29/2012    New onset evaluated by Dr Marylou FlesherWilliam Huang, [facility]  Rx 0.1% Tacrolimus ointment  . Complication of anesthesia     "because I have sleep apnea" (03/30/2013)  . Heart murmur   . Chest pain, exertional   . Chronic bronchitis   . Sleep apnea     "waiting on my CPAP" (03/30/2013)  . Pneumonia     "a few times" (03/30/2013)  . Exertional shortness of breath     "& sometimes when laying down" (03/30/2013)  . Daily headache     "last 2 months" (03/30/2013)  . Arthritis     "joints" (03/30/2013)  . Varicose veins of legs   . Psoriasis    Past Surgical History  Procedure Laterality Date  . Spinal fusion      2004  . Carpal tunnel release Bilateral   . Tubal ligation  1973  . Dilation and curettage of  uterus  1970's    "once" (03/30/2013)  . 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 Joint replacement    . Vein surgery  left leg   Family History  Problem Relation Age of Onset  . Diabetes Sister   . Colon cancer Brother 40  . Colon cancer Brother    History  Substance Use Topics  . Smoking status: Never Smoker   . Smokeless tobacco: Never Used  . Alcohol Use: No   OB History    No data available     Review of Systems  Constitutional: Negative for fever.  Respiratory: Negative for cough and shortness of breath.   Gastrointestinal: Negative for vomiting and abdominal pain.  Musculoskeletal: Negative for neck pain.  All other systems reviewed and are negative.     Allergies  Other  Home Medications   Prior to Admission medications   Medication Sig Start Date End Date Taking?  Authorizing Provider  acetaminophen (TYLENOL) 500 MG tablet Take 1,000 mg by mouth every 6 (six) hours as needed for mild pain (pain).    Yes Historical Provider, MD  albuterol (PROVENTIL HFA;VENTOLIN HFA) 108 (90 BASE) MCG/ACT inhaler Inhale 2 puffs into the lungs every 6 (six) hours as needed for wheezing or shortness of breath (wheezing).   Yes Historical Provider, MD  atenolol (TENORMIN) 100 MG tablet Take 100 mg by mouth daily.   Yes Historical Provider, MD  FLUoxetine (PROZAC) 20 MG capsule Take 3 capsules (60 mg total) by mouth daily. 05/26/14  Yes Tyrone Nineyan B Grunz, MD  fluticasone (FLOVENT HFA) 220 MCG/ACT inhaler Inhale 2 puffs into the lungs 2 (two) times daily.   Yes Historical Provider, MD  furosemide (LASIX) 20 MG tablet Take 20 mg by mouth daily.   Yes Historical Provider, MD  guaiFENesin-codeine 100-10 MG/5ML syrup Take 5 mLs by mouth every 6 (six) hours as needed for cough. 09/11/14  Yes Derwood KaplanAnkit Nanavati, MD  hydrochlorothiazide (HYDRODIURIL) 25 MG tablet Take 25 mg by mouth daily.   Yes Historical Provider, MD  hydrOXYzine (ATARAX/VISTARIL) 10 MG tablet Take 10 mg by mouth at bedtime as needed for itching (itching).    Yes Historical Provider, MD  lisinopril (PRINIVIL,ZESTRIL) 10 MG tablet Take 10 mg by mouth daily.  05/08/14  Yes Historical Provider, MD  meclizine (ANTIVERT) 25 MG tablet Take 25 mg by mouth 3 (three) times daily as needed for dizziness (dizziness).    Yes Historical Provider, MD  montelukast (SINGULAIR) 10 MG tablet Take 1 tablet (10 mg total) by mouth at bedtime. 05/11/14  Yes Tyrone Nineyan B Grunz, MD  naproxen sodium (ANAPROX) 220 MG tablet Take 660 mg by mouth 2 (two) times daily.   Yes Historical Provider, MD  omeprazole (PRILOSEC) 40 MG capsule Take 1 capsule (40 mg total) by mouth daily. 08/31/14  Yes Arby BarretteMarcy Pfeiffer, MD  silver sulfADIAZINE (SILVADENE) 1 % cream APPLY 1 APPLICATION TOPICALLY DAILY. 08/06/14  Yes Tyrone Nineyan B Grunz, MD  sucralfate (CARAFATE) 1 G tablet Take 1 tablet (1  g total) by mouth 4 (four) times daily. 05/11/14  Yes Tyrone Nineyan B Grunz, MD  tacrolimus (PROTOPIC) 0.1 % ointment Apply 1 application topically 3 (three) times daily as needed (for legs). Apply to affected areas daily PRN 03/01/14 03/01/15 Yes Historical Provider, MD  traMADol (ULTRAM) 50 MG tablet Take 1 tablet (50 mg total) by mouth every 6 (six) hours as needed. 09/04/14  Yes Einar GipWilliam Duncan Dansie, PA-C  triamcinolone cream (KENALOG) 0.1 % Apply 1 application topically 2 (two) times  daily as needed (for legs).    Yes Historical Provider, MD  warfarin (COUMADIN) 5 MG tablet Take 1 tablet (5 mg total) by mouth daily. 01/28/14  Yes Levert Feinstein, MD  azithromycin (ZITHROMAX) 250 MG tablet Take 1 tablet (250 mg total) by mouth daily. 1 tablet q daily starting 09/12/14 Patient not taking: Reported on 09/20/2014 09/11/14   Derwood Kaplan, MD  furosemide (LASIX) 20 MG tablet TAKE 1 TABLET BY MOUTH EVERY DAY Patient not taking: Reported on 09/20/2014 09/07/14   Tyrone Nine, MD  predniSONE (DELTASONE) 50 MG tablet Take 1 tablet (50 mg total) by mouth daily. Patient not taking: Reported on 09/20/2014 09/11/14   Derwood Kaplan, MD  traMADol (ULTRAM) 50 MG tablet Take 1 tablet (50 mg total) by mouth every 6 (six) hours as needed for severe pain. 09/20/14   Elwin Mocha, MD   BP 125/89 mmHg  Pulse 97  Temp(Src)   Resp 20  SpO2 97%  LMP 11/22/1996 Physical Exam  Constitutional: She is oriented to person, place, and time. She appears well-developed and well-nourished. No distress.  HENT:  Head: Normocephalic and atraumatic.  Mouth/Throat: Oropharynx is clear and moist.  Eyes: EOM are normal. Pupils are equal, round, and reactive to light.  Neck: Normal range of motion. Neck supple.  Cardiovascular: Normal rate and regular rhythm.  Exam reveals no friction rub.   No murmur heard. Pulmonary/Chest: Effort normal and breath sounds normal. No respiratory distress. She has no wheezes. She has no rales.   Abdominal: Soft. She exhibits no distension. There is no tenderness. There is no rebound.  Musculoskeletal: Normal range of motion. She exhibits no edema.       Left wrist: She exhibits tenderness (mild, diffuse). She exhibits normal range of motion, no bony tenderness, no swelling (no warmth or erythema) and no effusion.       Right knee: She exhibits normal range of motion, no swelling (no warmth), no effusion, no erythema and normal alignment. Tenderness (mild, diffuse) found.       Lumbar back: She exhibits normal range of motion, no tenderness, no bony tenderness, no swelling and no edema.  Neurological: She is alert and oriented to person, place, and time.  Skin: No rash noted. She is not diaphoretic.  Nursing note and vitals reviewed.   ED Course  Procedures (including critical care time) Labs Review Labs Reviewed - No data to display  Imaging Review No results found.   EKG Interpretation None      MDM   Final diagnoses:  Arthralgia    68F here with arthralgias. 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 Present for several months, thinks it is worse today because of the rain. No falls, fever/chills, CP, SOB, N/V/D. All joints with full ROM, no effusion, just painful. Likely having arthralgias due to change in weather. Has taken tramadol before, will give tramadol today. Instructed her to f/u with her PCP.    Elwin Mocha, MD 09/21/14 [phone]

## 2014-09-20 NOTE — Discharge Instructions (Signed)
Arthralgia °Your caregiver has diagnosed you as suffering from an arthralgia. Arthralgia means there is pain in a joint. This can come from many reasons including: °· Bruising the joint which causes soreness (inflammation) in the joint. °· Wear and tear on the joints which occur as we grow older (osteoarthritis). °· Overusing the joint. °· Various forms of arthritis. °· Infections of the joint. °Regardless of the cause of pain in your joint, most of these different pains respond to anti-inflammatory drugs and rest. The exception to this is when a joint is infected, and these cases are treated with antibiotics, if it is a bacterial infection. °HOME CARE INSTRUCTIONS  °· Rest the injured area for as long as directed by your caregiver. Then slowly start using the joint as directed by your caregiver and as the pain allows. Crutches as directed may be useful if the ankles, knees or hips are involved. If the knee was splinted or casted, continue use and care as directed. If an stretchy or elastic wrapping bandage has been applied today, it should be removed and re-applied every 3 to 4 hours. It should not be applied tightly, but firmly enough to keep swelling down. Watch toes and feet for swelling, bluish discoloration, coldness, numbness or excessive pain. If any of these problems (symptoms) occur, remove the ace bandage and re-apply more loosely. If these symptoms persist, contact your caregiver or return to this location. °· For the first 24 hours, keep the injured extremity elevated on pillows while lying down. °· Apply ice for 15-20 minutes to the sore joint every couple hours while awake for the first half day. Then 03-04 times per day for the first 48 hours. Put the ice in a plastic bag and place a towel between the bag of ice and your skin. °· Wear any splinting, casting, elastic bandage applications, or slings as instructed. °· Only take over-the-counter or prescription medicines for pain, discomfort, or fever as  directed by your caregiver. Do not use aspirin immediately after the injury unless instructed by your physician. Aspirin can cause increased bleeding and bruising of the tissues. °· If you were given crutches, continue to use them as instructed and do not resume weight bearing on the sore joint until instructed. °Persistent pain and inability to use the sore joint as directed for more than 2 to 3 days are warning signs indicating that you should see a caregiver for a follow-up visit as soon as possible. Initially, a hairline fracture (break in bone) may not be evident on X-rays. Persistent pain and swelling indicate that further evaluation, non-weight bearing or use of the joint (use of crutches or slings as instructed), or further X-rays are indicated. X-rays may sometimes not show a small fracture until a week or 10 days later. Make a follow-up appointment with your own caregiver or one to whom we have referred you. A radiologist (specialist in reading X-rays) may read your X-rays. Make sure you know how you are to obtain your X-ray results. Do not assume everything is normal if you do not hear from us. °SEEK MEDICAL CARE IF: °Bruising, swelling, or pain increases. °SEEK IMMEDIATE MEDICAL CARE IF:  °· Your fingers or toes are numb or blue. °· The pain is not responding to medications and continues to stay the same or get worse. °· The pain in your joint becomes severe. °· You develop a fever over 102° F (38.9° C). °· It becomes impossible to move or use the joint. °MAKE SURE YOU:  °·   Understand these instructions. °· Will watch your condition. °· Will get help right away if you are not doing well or get worse. °Document Released: 09/10/2005 Document Revised: 12/03/2011 Document Reviewed: 04/28/2008 °ExitCare® Patient Information ©2015 [employer]. This information is not intended to replace advice given to you by your health care provider. Make sure you discuss any questions you have with your health care  provider. ° ° ° °Emergency Department Resource Guide °1) Find a Doctor and Pay Out of Pocket °Although you won't have to find out who is covered by your insurance plan, it is a good idea to ask around and get recommendations. You will then need to call the office and see if the doctor you have chosen will accept you as a new patient and what types of options they offer for patients who are self-pay. Some doctors offer discounts or will set up payment plans for their patients who do not have insurance, but you will need to ask so you aren't surprised when you get to your appointment. ° °2) Contact Your Local Health Department °Not all health departments have doctors that can see patients for sick visits, but many do, so it is worth a call to see if yours does. If you don't know where your local health department is, you can check in your phone book. The CDC also has a tool to help you locate your state's health department, and many state websites also have listings of all of their local health departments. ° °3) Find a Walk-in Clinic °If your illness is not likely to be very severe or complicated, you may want to try a walk in clinic. These are popping up all over the country in pharmacies, drugstores, and shopping centers. They're usually staffed by nurse practitioners or physician assistants that have been trained to treat common illnesses and complaints. They're usually fairly quick and inexpensive. However, if you have serious medical issues or chronic medical problems, these are probably not your best option. ° °No Primary Care Doctor: °- Call [facility] at  832-8000 - they can help you locate a primary care doctor that  accepts your insurance, provides certain services, etc. °- Physician Referral Service- 1-800-533-3463 ° °Chronic Pain Problems: °Organization         Address  Phone   Notes  °[facility]  (336) 297-2271 Patients need to be referred by their primary care doctor.   ° °Medication Assistance: °Organization         Address  Phone   Notes  °[place] [facility] [address] °[place], [place] [address] (336) 641-8030 --Must be a resident of [place] °-- Must have NO insurance coverage whatsoever (no Medicaid/ Medicare, etc.) °-- The pt. MUST have a primary care doctor that directs their care regularly and follows them in the community °  °[facility]  (866) 331-1348   °[employer]  (888) 892-1162   ° °Agencies that provide inexpensive medical care: °Organization         Address  Phone   Notes  °[facility]  (336) 832-8035   °[facility]    (336) 832-7272   °[facility] [address] °[place], [place] [address] (336) 832-4777   °[facility] [address], °[place] (336) 271-4999   °[facility]    (336) 373-0678   °[facility]    (336) 272-1050   °[facility] °   [address], [place] Phone:  (336) 832-4444, Fax:  (336) 832-4440 Hours of Operation:  9 am - 6 pm, M-F.  Also accepts Medicaid/Medicare and self-pay.  °[facility] ° [address], [place] Phone: (336) 832-3150, Fax: (336) 832-3151. Hours of Operation:  8:30 am - 5:30 pm, M-F.  Also accepts Medicaid and self-pay.  °[facility] [place] [address], [place] Phone: (336) 878-6027   °[facility] [address], [place], [place] (336)723-1848, Ext. 123 Mondays & Thursdays: 7-9 AM.  First 15 patients are seen on a first come, first serve basis. °  ° °Medicaid-accepting [place] Providers: ° °Organization         Address  Phone   Notes  °[facility] [address], [place] (336) 641-2100 Also accepts self-pay patients.  °[facility] [address], [place] ° (336) 856-9996   °[facility] [address], [place] (336) 288-8857   °[facility] [address] [place] [address], [place] (336) 299-7000   °Veita Bland [address], [place]  ° (336) 373-1557 Only accepts [place] Access Medicaid patients after they have their name applied to their card.  ° °Self-Pay (no insurance) in [place]: ° °Organization         Address  Phone   Notes  °Sickle Cell Patients, [facility] [address], [place] (336) 832-1970   °[facility] [address], [place] (336) 832-4400   °[facility] ° [address], [place] (336) 992-4800   °[facility]/Dr. Osei-Bonsu ° [address] [place] [address], [place] or [address], [place] (336) [phone] Phone number for both [place] and [place] locations is the same.  °[facility] [address], [place] (336) 299-0000   °[facility] [address] [place] [address], [place] or [address] (336) 852-7530 °(336) 878-2260   °[facility] [address], [place] (336) 350-1642, phone; (336) 294-5005, fax Sees patients 1st and 3rd Saturday of every month.  Must not qualify for public or private insurance (i.e. Medicaid, Medicare, [place] Health Choice, Veterans' Benefits) • Household income should be no more than 200% of the poverty level •The clinic cannot treat you if you are pregnant or think you are pregnant • Sexually transmitted diseases are not treated at the clinic.  ° ° °Dental Care: °Organization         Address  Phone  Notes  °[facility] [address], [place] (336) 641-6152 Accepts children up to age 21 who are enrolled in Medicaid or [place] Health Choice; pregnant women with a Medicaid card; and children who have applied for Medicaid or [place] Health Choice, but were declined, whose parents can pay a reduced fee at time of service.    °[facility] [place]  [address], [place] (336) 641-7733 Accepts children up to age 21 who are enrolled in Medicaid or [place] Health Choice; pregnant women with a Medicaid card; and children who have applied for Medicaid or [place] Health Choice, but were declined, whose parents can pay a reduced fee at time of service.  °[facility] ° [address],  (336)   641-4533 Patients are seen by appointment only. Walk-ins are not accepted. [facility] will see patients 18 years of age and older. °Monday - Tuesday (8am-5pm) °Most Wednesdays (8:30-5pm) °$30 per visit, cash only  °[facility] ° [address], [place] (336) 641-4533 Patients are seen by appointment only. Walk-ins are not accepted. [facility] will see patients 18 years of age and older. °One Wednesday Evening (Monthly: Volunteer Based).  $30 per visit, cash only  °[facility]  (919) [phone] for adults; Children under age 4, call Graduate Pediatric Dentistry at ([phone]) [phone]. Children aged 4-14, please call ([phone]) [phone] to request a pediatric application. ° Dental services are provided in all areas of dental care including fillings, crowns and bridges, complete and partial dentures, implants, gum treatment, root canals, and extractions. Preventive care is also provided. Treatment is provided to both adults and children. °Patients are selected via a lottery and there is often a waiting list. °  °[facility] [address], °[place] ° (336) 763-8833 [email or web address] °  °[facility] [address], [place], [place] (336)723-1848, Ext. 123 Second and Fourth Thursday of each month, opens at 6:30 AM; Clinic ends at 9 AM.  Patients are seen on a first-come first-served basis, and a limited number are seen during each clinic.  ° °[facility] ° [address], [place], [place] (336)  723-7904   Eligibility Requirements °You must have lived in [place], [place], or [place] counties for at least the last three months. °  You cannot be eligible for state or federal sponsored healthcare insurance, including Veterans Administration, Medicaid, or Medicare. °  You generally cannot be eligible for healthcare insurance through your employer.  °  How to apply: °Eligibility screenings are held every Tuesday and Wednesday afternoon from 1:00 pm until 4:00 pm. You do not need an appointment for the interview!  °[facility] [address], [place], [place] 336-631-2330   °[facility] [facility]  336-342-8273   °[facility]  336-703-3100   °[facility]  336-570-6415   ° °Behavioral Health Resources in the Community: °Intensive Outpatient Programs °Organization         Address  Phone  Notes  °[facility] [address], [place], [place] 336-878-6098   °[facility] [address], [place], [place] 336-832-9800   °[facility] [address], [place], [place] ° 336-882-2125   °[facility] [address],  °[place], [place] 1-800-853-5163 or 336-641-4981   °Substance Abuse Resources °Organization         Address  Phone  Notes  °[facility]  336-882-2125   °[facility]  336-784-9470   °[facility]  336-285-9073   °[facility]  336-845-3988   °Residential & Outpatient Substance Abuse Program  1-800-659-3381   °Psychological Services °Organization         Address  Phone  Notes  °[facility]  336- 832-9600   °[facility]  336- 378-7881   °[facility] [address], [place] 1-800-853-5163 or 336-641-4981   ° °Mobile Crisis Teams °Organization         Address  Phone  Notes  °[facility], Mobile Crisis Care Unit  1-877-626-1772   °Assertive °[facility] ° [address] [place], [place] 336-834-9664   °Sharon DeEsch [address] °[place] 336-554-5454   ° °Self-Help/Support Groups °Organization         Address  Phone             Notes  °[facility] - variety of support groups  336- 373-1402 Call for more information  °Narcotics Anonymous (NA), [facility] [address], °[place]  2 meetings at this location  ° °Residential Treatment Programs °Organization         Address  Phone  Notes  °[facility] [address],    °[place]  1-866-801-8205   °[facility] ° [address], [place], [place] 704-293-8524   °[facility] [address], [place] 336-845-3988 Admissions: 8am-3pm M-F  °[facility] [address]    °[place], [place] 336-841-1104   °[facility] [address], [place], [place] 336-379-7146   °[facility] [address]  °[place], [place] 336-285-9073   °[facility] - Intensive Outpatient [address], [place], [place] 336-852-3033   °[facility] [address]  °[place], [place] 1-877-615-2722 or 336-784-9470   °[facility] [address] [place], [place] 336-227-7417 Accepts Medicaid  °[facility] [address]  °[place] 1-800-659-3381 Substance Abuse/Addiction Treatment  ° °[place] Behavioral Health Resources °Organization         Address  Phone  Notes  °[facility]  (888) 581-9988   °Julie Brannon, PhD [address] [place], [place]   (336) 349-5553 or (336) 951-0000   °[facility]   [address] °[place], [place] (336) 349-4454   °[facility] [address], [place], [place] (336) 342-8316 Insurance/Medicaid/sponsorship through [facility]  °[facility] [address]                                    [place], [place] (336) 342-8316  Therapy/tele-psych/case  °[facility] [address]  ° [place],  (336) 349-2233    °Dr. Arfeen  (336) 349-4544   °[facility] [place]  [employer] [place] [facility] 1) [address], [place] °2) [address], [place] °3)  [address], [place] (336) 349-3220 °(336) 342-7768 ° °(336) 342-8140   °[place] [facility] (336) 342-1394 or (336) 342-3537 (After Hours)    ° ° ° ° °

## 2014-09-27 ENCOUNTER — Emergency Department (HOSPITAL_COMMUNITY)
Admission: EM | Admit: 2014-09-27 | Discharge: 2014-09-28 | Disposition: A | Discharge status: Other Acute Inpatient Hospital | Payer: Medicare Other | Attending: Emergency Medicine | Admitting: Emergency Medicine

## 2014-09-27 ENCOUNTER — Encounter (HOSPITAL_COMMUNITY): Payer: Self-pay | Admitting: Emergency Medicine

## 2014-09-27 DIAGNOSIS — Z86718 Personal history of other venous thrombosis and embolism: Secondary | ICD-10-CM | POA: Diagnosis not present

## 2014-09-27 DIAGNOSIS — Z79899 Other long term (current) drug therapy: Secondary | ICD-10-CM | POA: Diagnosis not present

## 2014-09-27 DIAGNOSIS — Z7901 Long term (current) use of anticoagulants: Secondary | ICD-10-CM | POA: Insufficient documentation

## 2014-09-27 DIAGNOSIS — Z8701 Personal history of pneumonia (recurrent): Secondary | ICD-10-CM | POA: Diagnosis not present

## 2014-09-27 DIAGNOSIS — Z792 Long term (current) use of antibiotics: Secondary | ICD-10-CM | POA: Insufficient documentation

## 2014-09-27 DIAGNOSIS — Z791 Long term (current) use of non-steroidal anti-inflammatories (NSAID): Secondary | ICD-10-CM | POA: Diagnosis not present

## 2014-09-27 DIAGNOSIS — R011 Cardiac murmur, unspecified: Secondary | ICD-10-CM | POA: Insufficient documentation

## 2014-09-27 DIAGNOSIS — Z86711 Personal history of pulmonary embolism: Secondary | ICD-10-CM | POA: Insufficient documentation

## 2014-09-27 DIAGNOSIS — R45851 Suicidal ideations: Secondary | ICD-10-CM | POA: Diagnosis present

## 2014-09-27 DIAGNOSIS — J45909 Unspecified asthma, uncomplicated: Secondary | ICD-10-CM | POA: Diagnosis not present

## 2014-09-27 DIAGNOSIS — G8929 Other chronic pain: Secondary | ICD-10-CM | POA: Insufficient documentation

## 2014-09-27 DIAGNOSIS — G4730 Sleep apnea, unspecified: Secondary | ICD-10-CM | POA: Insufficient documentation

## 2014-09-27 DIAGNOSIS — Z9981 Dependence on supplemental oxygen: Secondary | ICD-10-CM | POA: Diagnosis not present

## 2014-09-27 DIAGNOSIS — K219 Gastro-esophageal reflux disease without esophagitis: Secondary | ICD-10-CM | POA: Insufficient documentation

## 2014-09-27 DIAGNOSIS — F323 Major depressive disorder, single episode, severe with psychotic features: Secondary | ICD-10-CM | POA: Diagnosis not present

## 2014-09-27 DIAGNOSIS — Z862 Personal history of diseases of the blood and blood-forming organs and certain disorders involving the immune mechanism: Secondary | ICD-10-CM | POA: Insufficient documentation

## 2014-09-27 DIAGNOSIS — M79662 Pain in left lower leg: Secondary | ICD-10-CM | POA: Insufficient documentation

## 2014-09-27 DIAGNOSIS — Z872 Personal history of diseases of the skin and subcutaneous tissue: Secondary | ICD-10-CM | POA: Diagnosis not present

## 2014-09-27 DIAGNOSIS — M1990 Unspecified osteoarthritis, unspecified site: Secondary | ICD-10-CM | POA: Diagnosis not present

## 2014-09-27 DIAGNOSIS — Z7951 Long term (current) use of inhaled steroids: Secondary | ICD-10-CM | POA: Insufficient documentation

## 2014-09-27 DIAGNOSIS — Z7952 Long term (current) use of systemic steroids: Secondary | ICD-10-CM | POA: Insufficient documentation

## 2014-09-27 DIAGNOSIS — R45850 Homicidal ideations: Secondary | ICD-10-CM

## 2014-09-27 DIAGNOSIS — F1110 Opioid abuse, uncomplicated: Secondary | ICD-10-CM | POA: Insufficient documentation

## 2014-09-27 DIAGNOSIS — F419 Anxiety disorder, unspecified: Secondary | ICD-10-CM | POA: Insufficient documentation

## 2014-09-27 LAB — CBC WITH DIFFERENTIAL/PLATELET
BASOS ABS: 0 10*3/uL (ref 0.0–0.1)
Basophils Relative: 0 % (ref 0–1)
EOS PCT: 2 % (ref 0–5)
Eosinophils Absolute: 0.2 10*3/uL (ref 0.0–0.7)
HEMATOCRIT: 32 % — AB (ref 36.0–46.0)
Hemoglobin: 10.5 g/dL — ABNORMAL LOW (ref 12.0–15.0)
LYMPHS PCT: 10 % — AB (ref 12–46)
Lymphs Abs: 1.4 10*3/uL (ref 0.7–4.0)
MCH: 29.9 pg (ref 26.0–34.0)
MCHC: 32.8 g/dL (ref 30.0–36.0)
MCV: 91.2 fL (ref 78.0–100.0)
MONO ABS: 0.9 10*3/uL (ref 0.1–1.0)
Monocytes Relative: 6 % (ref 3–12)
Neutro Abs: 11.7 10*3/uL — ABNORMAL HIGH (ref 1.7–7.7)
Neutrophils Relative %: 82 % — ABNORMAL HIGH (ref 43–77)
PLATELETS: 241 10*3/uL (ref 150–400)
RBC: 3.51 MIL/uL — ABNORMAL LOW (ref 3.87–5.11)
RDW: 15.4 % (ref 11.5–15.5)
WBC: 14.2 10*3/uL — AB (ref 4.0–10.5)

## 2014-09-27 LAB — COMPREHENSIVE METABOLIC PANEL
ALT: 19 U/L (ref 0–35)
AST: 29 U/L (ref 0–37)
Albumin: 3.4 g/dL — ABNORMAL LOW (ref 3.5–5.2)
Alkaline Phosphatase: 102 U/L (ref 39–117)
Anion gap: 8 (ref 5–15)
BUN: 19 mg/dL (ref 6–23)
CO2: 29 mmol/L (ref 19–32)
CREATININE: 1.19 mg/dL — AB (ref 0.50–1.10)
Calcium: 9.3 mg/dL (ref 8.4–10.5)
Chloride: 99 mEq/L (ref 96–112)
GFR, EST AFRICAN AMERICAN: 52 mL/min — AB (ref >90)
GFR, EST NON AFRICAN AMERICAN: 45 mL/min — AB (ref >90)
GLUCOSE: 124 mg/dL — AB (ref 70–99)
Potassium: 3.2 mmol/L — ABNORMAL LOW (ref 3.5–5.1)
Sodium: 136 mmol/L (ref 135–145)
TOTAL PROTEIN: 7.4 g/dL (ref 6.0–8.3)
Total Bilirubin: 0.8 mg/dL (ref 0.3–1.2)

## 2014-09-27 LAB — PROTIME-INR
INR: 1.29 (ref 0.00–1.49)
Prothrombin Time: 16.3 seconds — ABNORMAL HIGH (ref 11.6–15.2)

## 2014-09-27 LAB — RAPID URINE DRUG SCREEN, HOSP PERFORMED
Amphetamines: NOT DETECTED
BENZODIAZEPINES: NOT DETECTED
Barbiturates: NOT DETECTED
Cocaine: NOT DETECTED
Opiates: POSITIVE — AB
Tetrahydrocannabinol: NOT DETECTED

## 2014-09-27 LAB — ETHANOL: Alcohol, Ethyl (B): <5 mg/dL (ref 0–9)

## 2014-09-27 MED ORDER — PANTOPRAZOLE SODIUM 40 MG PO TBEC
40.0000 mg | DELAYED_RELEASE_TABLET | Freq: Every day | ORAL | Status: DC
  Administered 2014-09-27: 40 mg via ORAL
  Filled 2014-09-27: qty 1

## 2014-09-27 MED ORDER — FUROSEMIDE 20 MG PO TABS
20.0000 mg | ORAL_TABLET | Freq: Every day | ORAL | Status: DC
  Administered 2014-09-27: 20 mg via ORAL
  Filled 2014-09-27: qty 1

## 2014-09-27 MED ORDER — LORAZEPAM 1 MG PO TABS: 1.0000 mg | ORAL_TABLET | Freq: Three times a day (TID) | ORAL | Status: DC | PRN

## 2014-09-27 MED ORDER — LISINOPRIL 10 MG PO TABS
10.0000 mg | ORAL_TABLET | Freq: Every day | ORAL | Status: DC
  Administered 2014-09-27: 10 mg via ORAL
  Filled 2014-09-27: qty 1

## 2014-09-27 MED ORDER — FLUTICASONE PROPIONATE HFA 220 MCG/ACT IN AERO
2.0000 | INHALATION_SPRAY | Freq: Two times a day (BID) | RESPIRATORY_TRACT | Status: DC
  Administered 2014-09-27: 2 via RESPIRATORY_TRACT
  Filled 2014-09-27: qty 12

## 2014-09-27 MED ORDER — ALBUTEROL SULFATE HFA 108 (90 BASE) MCG/ACT IN AERS: 2.0000 | INHALATION_SPRAY | Freq: Four times a day (QID) | RESPIRATORY_TRACT | Status: DC | PRN

## 2014-09-27 MED ORDER — WARFARIN SODIUM 5 MG PO TABS
5.0000 mg | ORAL_TABLET | Freq: Every day | ORAL | Status: DC
  Administered 2014-09-27: 5 mg via ORAL
  Filled 2014-09-27 (×2): qty 1

## 2014-09-27 MED ORDER — TACROLIMUS 0.1 % EX OINT: 1.0000 "application " | TOPICAL_OINTMENT | Freq: Three times a day (TID) | CUTANEOUS | Status: DC | PRN

## 2014-09-27 MED ORDER — TRIAMCINOLONE ACETONIDE 0.1 % EX CREA
1.0000 "application " | TOPICAL_CREAM | Freq: Two times a day (BID) | CUTANEOUS | Status: DC | PRN
  Filled 2014-09-27: qty 15

## 2014-09-27 MED ORDER — LORAZEPAM 2 MG/ML IJ SOLN
2.0000 mg | Freq: Once | INTRAMUSCULAR | Status: AC
  Administered 2014-09-27: 2 mg via INTRAMUSCULAR
  Filled 2014-09-27: qty 1

## 2014-09-27 MED ORDER — ENOXAPARIN SODIUM 150 MG/ML ~~LOC~~ SOLN
130.0000 mg | Freq: Two times a day (BID) | SUBCUTANEOUS | Status: DC
  Administered 2014-09-27: 130 mg via SUBCUTANEOUS
  Filled 2014-09-27 (×6): qty 1

## 2014-09-27 MED ORDER — FLUOXETINE HCL 20 MG PO CAPS
60.0000 mg | ORAL_CAPSULE | Freq: Every day | ORAL | Status: DC
  Administered 2014-09-27: 60 mg via ORAL
  Filled 2014-09-27 (×2): qty 3

## 2014-09-27 MED ORDER — WARFARIN - PHARMACIST DOSING INPATIENT: Freq: Every day | Status: DC

## 2014-09-27 MED ORDER — ACETAMINOPHEN 500 MG PO TABS: 1000.0000 mg | ORAL_TABLET | Freq: Four times a day (QID) | ORAL | Status: DC | PRN

## 2014-09-27 MED ORDER — HYDROCHLOROTHIAZIDE 25 MG PO TABS
25.0000 mg | ORAL_TABLET | Freq: Every day | ORAL | Status: DC
  Administered 2014-09-27: 25 mg via ORAL
  Filled 2014-09-27: qty 1

## 2014-09-27 MED ORDER — ATENOLOL 25 MG PO TABS
100.0000 mg | ORAL_TABLET | Freq: Every day | ORAL | Status: DC
  Administered 2014-09-27: 100 mg via ORAL
  Filled 2014-09-27: qty 2

## 2014-09-27 MED ORDER — OXYCODONE-ACETAMINOPHEN 5-325 MG PO TABS
1.0000 | ORAL_TABLET | Freq: Once | ORAL | Status: AC
Start: 2014-09-27 — End: 2014-09-27
  Administered 2014-09-27: 1 via ORAL
  Filled 2014-09-27: qty 1

## 2014-09-27 MED ORDER — WARFARIN SODIUM 5 MG PO TABS: 5.0000 mg | ORAL_TABLET | Freq: Every day | ORAL | Status: DC

## 2014-09-27 MED ORDER — TRAMADOL HCL 50 MG PO TABS
50.0000 mg | ORAL_TABLET | Freq: Four times a day (QID) | ORAL | Status: DC | PRN
  Administered 2014-09-27 – 2014-09-28 (×2): 50 mg via ORAL
  Filled 2014-09-27 (×2): qty 1

## 2014-09-27 NOTE — ED Provider Notes (Signed)
5:20 PM Patient reported to be more agitated, trying to elope, as she voices concern about her family members. She is tangential, repetitive, but continues to acknowledge a plan for suicide. Patient required benzodiazepines for sedation.   Gerhard Munch, MD 09/27/14 1721

## 2014-09-27 NOTE — ED Notes (Signed)
Pt reports itching r/t "chicken pox" on legs.  Visualized area, no sign of rash or blisters present.  Varicose veins noted.

## 2014-09-27 NOTE — BHH Counselor (Signed)
Per Verne Spurr, PA, pt meets inpt criteria. TTS to seek placement at gero-psych unit. PA recommends to begin with [facility] gero-psych unit.  TTS Counselor attempted to inform attending physician (Dr. Norlene Campbell) of disposition, but counselor was unable to reach her by phone. Counselor informed pt's attending RN Camelia Eng) of disposition.     Cyndie Mull, [facility] Triage Specialist
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
## 2014-09-27 NOTE — Progress Notes (Addendum)
ANTICOAGULATION CONSULT NOTE - Initial Consult  Pharmacy Consult for coumadin  Indication: hx PE  Allergies  Allergen Reactions  . Other     Pt states she allergic to a lot of generic medicines. Could not give me any specific names or reactions    Patient Measurements: Height:  (165.1 cm) Weight: 283 lb (128.368 kg) IBW/kg (Calculated) : 57 Heparin Dosing Weight:   Vital Signs: Temp: 99.2 F (37.3 C) (01/04 0049) Temp Source: Oral (01/04 0049) BP: 142/80 mmHg (01/04 0300) Pulse Rate: 99 (01/04 0300)  Labs:  Recent Labs  09/27/14 0146  HGB 10.5*  HCT 32.0*  PLT 241  LABPROT 16.3*  INR 1.29  CREATININE 1.19*    Estimated Creatinine Clearance: 58.6 mL/min (by C-G formula based on Cr of 1.19).   Medical History: Past Medical History  Diagnosis Date  . Depression   . Schizophrenia   . Hyperlipidemia   . Hypertension   . Chronic pain     "over my whole body" (03/30/2013)  . 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 DVT (deep venous thrombosis)     per 01/17/10 d/c summary- "remote hx of dvt"?  . Iron deficiency anemia   . Glaucoma   . Hemorrhoids   . Asthma   . Anxiety   . GERD (gastroesophageal reflux disease)   . Psoriasis 04/29/2012    New onset evaluated by Dr Marylou Flesher, Dermatology, [facility] 6/13  Rx 0.1% Tacrolimus ointment  . Complication of anesthesia     "because I have sleep apnea" (03/30/2013)  . Heart murmur   . Chest pain, exertional   . Chronic bronchitis   . Sleep apnea     "waiting on my CPAP" (03/30/2013)  . Pneumonia     "a few times" (03/30/2013)  . Exertional shortness of breath     "& sometimes when laying down" (03/30/2013)  . Daily headache     "last 2 months" (03/30/2013)  . Arthritis     "joints" (03/30/2013)  . Varicose veins of legs   . Psoriasis     Medications:  Home dose of coumadin is  daily.  Assessment: Pt with hx of PE/DVT admitted for SI. INR indicates non-compliance. Coumadin to restart  at  daily. Suggest bridge to therapeutic inr while in house.  Goal of Therapy:  INR 2-3 Monitor platelets by anticoagulation protocol: Yes   Plan:  Warfarin 5 mg daily at 1800.  Daily INR/PT  Janice Coffin 09/27/2014,4:28 AM  4:38 AM   Add lovenox treatment dose to therapeutic INR. Pt awaiting placement and may reside in ED until that time. Factor V leiden and previous dvt/PE, immobility and body habitus are all risk factors during this stay. Will begin lovenox  q12h to therapeutic inr as there is no active clot.  Janice Coffin

## 2014-09-27 NOTE — ED Notes (Signed)
CALLED PHARMACY ABOUT LOVENOX.

## 2014-09-27 NOTE — ED Notes (Signed)
Pt refused vitals to be taken

## 2014-09-27 NOTE — BH Assessment (Addendum)
Per Victorino Dike at [facility] the referral will not be presented to the doctor until tomorrow morning 09-28-2013 at 8am Per Amy at [facility] the referral will be reviewed this evening.  Amy reports that she will call back with the status.  Per Tyler Aas at [facility] the referral was not received.  Writer re-faxed the referral to [phone]. Per Minerva Areola, the referral will be reviewed in the morning.   Per [facility] at [facility]  - At capacity  Per Crystal at [facility] - At capacity  Per Marylene Land at [facility] - At capacity  Per [facility] - At capacity but referral can be faxed to the wait list [phone]. Per B[facility] at [facility] - At capacity.
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
## 2014-09-27 NOTE — BHH Counselor (Signed)
TTS Counselor spoke with pt's attending RN, Tiffany, and asked for tele-assessment cart to be placed in pt's room in preparation for Behavioral Health Assessment. Counselor also reviewed pt notes in Epic in preparation for assessment.  Assessment will begin shortly.    Cyndie Mull, [facility] Triage Specialist
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
## 2014-09-27 NOTE — ED Notes (Signed)
[facility] will seek geriatric inpatient tx for patient.

## 2014-09-27 NOTE — BHH Counselor (Signed)
TTS Counselor spoke with pt's attending physician, Dr. Norlene Campbell, and informed her of disposition for gero-psych placement. Dr. Norlene Campbell is in agreement. TTS Counselor will send referrals this AM.       Cyndie Mull, [facility] Triage Specialist

## 2014-09-27 NOTE — ED Notes (Signed)
Pt declines pain medication.  

## 2014-09-28 LAB — PROTIME-INR
INR: 1.19 (ref 0.00–1.49)
Prothrombin Time: 15.3 seconds — ABNORMAL HIGH (ref 11.6–15.2)

## 2014-09-28 NOTE — BH Assessment (Signed)
Per Rosey Batheresa at [facility] patient has been accepted to [facility].  Dr. Robet Leuhotakura is the accepting doctor.  [facility].  The number to call report is [phone]  The patient is able to come now.  Writer informed the ER MD Norlene Campbelltter .  The nurse will arrange transportation through [place].

## 2014-09-28 NOTE — ED Notes (Signed)
Pt states that the Tramadol is not enough to help with her pain, she needs something stronger than that to relieve her pain. Pt advice that she will need to try this medication and that I will let the MD know her concerns.

## 2014-10-01 ENCOUNTER — Ambulatory Visit: Payer: PRIVATE HEALTH INSURANCE

## 2014-10-01 ENCOUNTER — Encounter: Payer: Self-pay | Admitting: Pharmacist

## 2014-10-01 ENCOUNTER — Other Ambulatory Visit: Payer: PRIVATE HEALTH INSURANCE

## 2014-10-01 ENCOUNTER — Telehealth: Payer: Self-pay | Admitting: Pharmacist

## 2014-10-01 NOTE — Telephone Encounter (Signed)
I s/w Junious Dresseronnie, pharmacist at [facility] in [place] (Ph# [phone]) and she plans for pt to take Coumadin 7.5 mg x 2 days (1/8 & 1/9) then go back to 5 mg daily thereafter since pt's INR = 1.1 yesterday. Since pt being discharged on 10/05/14, unlikely they'll check another INR.  I gave Junious DresserConnie our pharmacy phone number in case she has questions. I also informed her pt does not need bridging w/ Lovenox at this time per Dr. Cyndie ChimeGranfortuna. Ebony HailGinna Cari Vandeberg, Pharm.D., CPP 10/01/2014@11 :36 AM

## 2014-10-01 NOTE — Telephone Encounter (Signed)
Reviewed Thanks DrG 

## 2014-10-01 NOTE — Progress Notes (Signed)
Pt recently in ED w/ suicidal ideations.  She was discharged to [facility] in [place]. I called & spoke w/ Jeni SallesElizabeth Robertson, RN caring for Verlee MonteDora there.  She stated pts Coumadin is being managed there by their clinical pharmacist Junious Dresser(Connie; ph# [phone]).  Pt had INR yesterday (09/30/14) = 1.1.  Pt currently on Coumadin 5 mg daily.  Not on Lovenox. The RN also gave me the name of their geriatric therapist, Earnstine RegalJo Reeves (ph# [phone]) who handles discharges.  She Alvino Chapel(Jo) called back to pharmacy & informed us pt is set to be discharged on Tues 10/05/14.  We informed Alvino ChapelJo that pt will need to come [facility] on 10/08/14 at 11 am for lab; 11:15 am for Coumadin clinic. Ebony HailGinna Leonette Tischer, Pharm.D., CPP 10/01/2014@10 :56 AM

## 2014-10-08 ENCOUNTER — Other Ambulatory Visit: Payer: Medicaid Other

## 2014-10-08 ENCOUNTER — Ambulatory Visit: Payer: Medicaid Other

## 2014-10-10 ENCOUNTER — Encounter (HOSPITAL_COMMUNITY): Payer: Self-pay | Admitting: Emergency Medicine

## 2014-10-10 ENCOUNTER — Emergency Department (HOSPITAL_COMMUNITY)
Admission: EM | Admit: 2014-10-10 | Discharge: 2014-10-10 | Disposition: A | Discharge status: Home / Self Care | Payer: Medicare Other | Attending: Emergency Medicine | Admitting: Emergency Medicine

## 2014-10-10 ENCOUNTER — Emergency Department (HOSPITAL_COMMUNITY): Payer: Medicare Other

## 2014-10-10 DIAGNOSIS — G4730 Sleep apnea, unspecified: Secondary | ICD-10-CM | POA: Diagnosis not present

## 2014-10-10 DIAGNOSIS — E785 Hyperlipidemia, unspecified: Secondary | ICD-10-CM | POA: Diagnosis not present

## 2014-10-10 DIAGNOSIS — F209 Schizophrenia, unspecified: Secondary | ICD-10-CM | POA: Diagnosis not present

## 2014-10-10 DIAGNOSIS — Z86711 Personal history of pulmonary embolism: Secondary | ICD-10-CM | POA: Insufficient documentation

## 2014-10-10 DIAGNOSIS — Z86718 Personal history of other venous thrombosis and embolism: Secondary | ICD-10-CM | POA: Diagnosis not present

## 2014-10-10 DIAGNOSIS — M1990 Unspecified osteoarthritis, unspecified site: Secondary | ICD-10-CM | POA: Diagnosis not present

## 2014-10-10 DIAGNOSIS — R42 Dizziness and giddiness: Secondary | ICD-10-CM | POA: Diagnosis not present

## 2014-10-10 DIAGNOSIS — Z9981 Dependence on supplemental oxygen: Secondary | ICD-10-CM | POA: Insufficient documentation

## 2014-10-10 DIAGNOSIS — K219 Gastro-esophageal reflux disease without esophagitis: Secondary | ICD-10-CM | POA: Diagnosis not present

## 2014-10-10 DIAGNOSIS — Z7901 Long term (current) use of anticoagulants: Secondary | ICD-10-CM | POA: Diagnosis not present

## 2014-10-10 DIAGNOSIS — H409 Unspecified glaucoma: Secondary | ICD-10-CM | POA: Insufficient documentation

## 2014-10-10 DIAGNOSIS — J45909 Unspecified asthma, uncomplicated: Secondary | ICD-10-CM | POA: Insufficient documentation

## 2014-10-10 DIAGNOSIS — Z7951 Long term (current) use of inhaled steroids: Secondary | ICD-10-CM | POA: Diagnosis not present

## 2014-10-10 DIAGNOSIS — R519 Headache, unspecified: Secondary | ICD-10-CM

## 2014-10-10 DIAGNOSIS — F419 Anxiety disorder, unspecified: Secondary | ICD-10-CM | POA: Diagnosis not present

## 2014-10-10 DIAGNOSIS — Z7982 Long term (current) use of aspirin: Secondary | ICD-10-CM | POA: Diagnosis not present

## 2014-10-10 DIAGNOSIS — Z79899 Other long term (current) drug therapy: Secondary | ICD-10-CM | POA: Insufficient documentation

## 2014-10-10 DIAGNOSIS — R51 Headache: Secondary | ICD-10-CM | POA: Insufficient documentation

## 2014-10-10 DIAGNOSIS — R011 Cardiac murmur, unspecified: Secondary | ICD-10-CM | POA: Diagnosis not present

## 2014-10-10 DIAGNOSIS — M545 Low back pain: Secondary | ICD-10-CM | POA: Diagnosis not present

## 2014-10-10 DIAGNOSIS — Z8701 Personal history of pneumonia (recurrent): Secondary | ICD-10-CM | POA: Insufficient documentation

## 2014-10-10 DIAGNOSIS — G8929 Other chronic pain: Secondary | ICD-10-CM | POA: Diagnosis not present

## 2014-10-10 DIAGNOSIS — Z7952 Long term (current) use of systemic steroids: Secondary | ICD-10-CM | POA: Insufficient documentation

## 2014-10-10 DIAGNOSIS — I10 Essential (primary) hypertension: Secondary | ICD-10-CM | POA: Diagnosis not present

## 2014-10-10 DIAGNOSIS — Z862 Personal history of diseases of the blood and blood-forming organs and certain disorders involving the immune mechanism: Secondary | ICD-10-CM | POA: Diagnosis not present

## 2014-10-10 DIAGNOSIS — Z872 Personal history of diseases of the skin and subcutaneous tissue: Secondary | ICD-10-CM | POA: Insufficient documentation

## 2014-10-10 DIAGNOSIS — F329 Major depressive disorder, single episode, unspecified: Secondary | ICD-10-CM | POA: Insufficient documentation

## 2014-10-10 DIAGNOSIS — Z791 Long term (current) use of non-steroidal anti-inflammatories (NSAID): Secondary | ICD-10-CM | POA: Diagnosis not present

## 2014-10-10 MED ORDER — DIPHENHYDRAMINE HCL 50 MG/ML IJ SOLN
25.0000 mg | Freq: Once | INTRAMUSCULAR | Status: AC
  Administered 2014-10-10: 25 mg via INTRAVENOUS
  Filled 2014-10-10: qty 1

## 2014-10-10 MED ORDER — TRAMADOL HCL 50 MG PO TABS: 50.0000 mg | ORAL_TABLET | Freq: Four times a day (QID) | ORAL | Status: DC | PRN

## 2014-10-10 MED ORDER — METOCLOPRAMIDE HCL 5 MG/ML IJ SOLN
10.0000 mg | Freq: Once | INTRAMUSCULAR | Status: AC
  Administered 2014-10-10: 10 mg via INTRAVENOUS
  Filled 2014-10-10: qty 2

## 2014-10-10 MED ORDER — DEXAMETHASONE SODIUM PHOSPHATE 10 MG/ML IJ SOLN
10.0000 mg | Freq: Once | INTRAMUSCULAR | Status: AC
  Administered 2014-10-10: 10 mg via INTRAVENOUS
  Filled 2014-10-10: qty 1

## 2014-10-10 NOTE — ED Notes (Signed)
Pt made aware that she cannot drive herself home after receiving medications in the ER; pt is aware and advises that she is taking a cab home

## 2014-10-10 NOTE — ED Notes (Signed)
[facility]  Pt was recently released from mental health facility on Monday, but stated "they didn't care about that".

## 2014-10-10 NOTE — ED Notes (Signed)
Pt reports dizziness for three months

## 2014-10-10 NOTE — Discharge Instructions (Signed)

## 2014-10-10 NOTE — ED Provider Notes (Signed)
CSN: [id number]     Arrival date & time 10/10/14  1809 History   First MD Initiated Contact with Patient 10/10/14 1831     Chief Complaint  Patient presents with  . Headache  . Dizziness     (Consider location/radiation/quality/duration/timing/severity/associated sxs/prior Treatment) Patient is a 74 y.o. female presenting with headaches and dizziness. The history is provided by the patient.  Headache Pain location:  Generalized Quality:  Dull Onset quality:  Gradual Duration:  3 weeks Timing:  Constant Progression:  Worsening Chronicity:  Recurrent Similar to prior headaches: yes   Context: not straining   Relieved by:  Nothing Worsened by:  Nothing tried Associated symptoms: back pain and dizziness   Associated symptoms: no fever   Dizziness Associated symptoms: headaches     Past Medical History  Diagnosis Date  . Depression   . Schizophrenia   . Hyperlipidemia   . Hypertension   . Chronic pain     "over my whole body" (03/30/2013)  . 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 DVT (deep venous thrombosis)     per 01/17/10 d/c summary- "remote hx of dvt"?  . Iron deficiency anemia   . Glaucoma   . Hemorrhoids   . Asthma   . Anxiety   . GERD (gastroesophageal reflux disease)   . Psoriasis 04/29/2012    New onset evaluated by Dr Marylou FlesherWilliam Huang, Dermatology, [facility] 6/13  Rx 0.1% Tacrolimus ointment  . Complication of anesthesia     "because I have sleep apnea" (03/30/2013)  . Heart murmur   . Chest pain, exertional   . Chronic bronchitis   . Sleep apnea     "waiting on my CPAP" (03/30/2013)  . Pneumonia     "a few times" (03/30/2013)  . Exertional shortness of breath     "& sometimes when laying down" (03/30/2013)  . Daily headache     "last 2 months" (03/30/2013)  . Arthritis     "joints" (03/30/2013)  . Varicose veins of legs   . Psoriasis    Past Surgical History  Procedure Laterality Date  . Spinal fusion      2004  . Carpal tunnel  release Bilateral   . Tubal ligation  1973  . Dilation and curettage of uterus  1970's    "once" (03/30/2013)  . 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 Joint replacement    . Vein surgery  left leg   Family History  Problem Relation Age of Onset  . Diabetes Sister   . Colon cancer Brother 40  . Colon cancer Brother    History  Substance Use Topics  . Smoking status: Never Smoker   . Smokeless tobacco: Never Used  . Alcohol Use: No   OB History    No data available     Review of Systems  Constitutional: Negative for fever.  Musculoskeletal: Positive for back pain.  Neurological: Positive for dizziness and headaches.  All other systems reviewed and are negative.     Allergies  Other  Home Medications   Prior to Admission medications   Medication Sig Start Date End Date Taking? Authorizing Provider  acetaminophen (TYLENOL) 500 MG tablet Take 1,000 mg by mouth every 6 (six) hours as needed for mild pain (pain).    Yes Historical Provider, MD  albuterol (PROVENTIL HFA;VENTOLIN HFA) 108 (90 BASE) MCG/ACT inhaler Inhale 2 puffs into the lungs every 6 (six) hours as needed for wheezing or shortness of  breath (wheezing).   Yes Historical Provider, MD  aspirin 325 MG tablet Take 650 mg by mouth 3 (three) times daily as needed for headache.   Yes Historical Provider, MD  beclomethasone (QVAR) 80 MCG/ACT inhaler Inhale 2 puffs into the lungs 2 (two) times daily.   Yes Historical Provider, MD  busPIRone (BUSPAR) 10 MG tablet Take 10 mg by mouth 3 (three) times daily.   Yes Historical Provider, MD  FLUoxetine (PROZAC) 20 MG capsule Take 3 capsules (60 mg total) by mouth daily. 05/26/14  Yes Tyrone Nine, MD  fluticasone (FLOVENT HFA) 220 MCG/ACT inhaler Inhale 2 puffs into the lungs 2 (two) times daily.   Yes Historical Provider, MD  furosemide (LASIX) 20 MG tablet TAKE 1 TABLET BY MOUTH EVERY DAY 09/07/14  Yes Tyrone Nine, MD  gabapentin (NEURONTIN) 100 MG capsule Take 200 mg by  mouth 3 (three) times daily.   Yes Historical Provider, MD  guaiFENesin-codeine 100-10 MG/5ML syrup Take 5 mLs by mouth every 6 (six) hours as needed for cough. 09/11/14  Yes Derwood Kaplan, MD  hydrochlorothiazide (HYDRODIURIL) 25 MG tablet Take 25 mg by mouth daily.   Yes Historical Provider, MD  hydrOXYzine (ATARAX/VISTARIL) 10 MG tablet Take 10 mg by mouth at bedtime as needed for itching (itching).    Yes Historical Provider, MD  meclizine (ANTIVERT) 25 MG tablet Take 25 mg by mouth 3 (three) times daily as needed for dizziness (dizziness).    Yes Historical Provider, MD  Misc Natural Products (ESTROVEN ENERGY PO) Take 1 tablet by mouth daily.   Yes Historical Provider, MD  montelukast (SINGULAIR) 10 MG tablet Take 1 tablet (10 mg total) by mouth at bedtime. 05/11/14  Yes Tyrone Nine, MD  omeprazole (PRILOSEC) 40 MG capsule Take 1 capsule (40 mg total) by mouth daily. 08/31/14  Yes Arby Barrette, MD  tacrolimus (PROTOPIC) 0.1 % ointment Apply 1 application topically 3 (three) times daily as needed (for legs). Apply to affected areas daily PRN 03/01/14 03/01/15 Yes Historical Provider, MD  triamcinolone cream (KENALOG) 0.1 % Apply 1 application topically 2 (two) times daily as needed (for legs).    Yes Historical Provider, MD  warfarin (COUMADIN) 5 MG tablet Take 1 tablet (5 mg total) by mouth daily. 01/28/14  Yes Levert Feinstein, MD  azithromycin (ZITHROMAX) 250 MG tablet Take 1 tablet (250 mg total) by mouth daily. 1 tablet q daily starting 09/12/14 Patient not taking: Reported on 09/20/2014 09/11/14   Derwood Kaplan, MD  naproxen sodium (ANAPROX) 220 MG tablet Take 660 mg by mouth 2 (two) times daily.    Historical Provider, MD  predniSONE (DELTASONE) 50 MG tablet Take 1 tablet (50 mg total) by mouth daily. Patient not taking: Reported on 09/20/2014 09/11/14   Derwood Kaplan, MD  silver sulfADIAZINE (SILVADENE) 1 % cream APPLY 1 APPLICATION TOPICALLY DAILY. Patient not taking: Reported on  10/10/2014 08/06/14   Tyrone Nine, MD  sucralfate (CARAFATE) 1 G tablet Take 1 tablet (1 g total) by mouth 4 (four) times daily. Patient not taking: Reported on 10/10/2014 05/11/14   Tyrone Nine, MD  traMADol (ULTRAM) 50 MG tablet Take 1 tablet (50 mg total) by mouth every 6 (six) hours as needed. 10/10/14   Elwin Mocha, MD  traMADol (ULTRAM) 50 MG tablet Take 1 tablet (50 mg total) by mouth every 6 (six) hours as needed for severe pain. 10/10/14   Elwin Mocha, MD   BP 128/63 mmHg  Pulse 87  Temp(Src)  97.9 F (36.6 C) (Oral)  Resp 18  SpO2 94%  LMP 11/22/1996 Physical Exam  Constitutional: She is oriented to person, place, and time. She appears well-developed and well-nourished. No distress.  HENT:  Head: Normocephalic and atraumatic.  Mouth/Throat: Oropharynx is clear and moist.  Eyes: EOM are normal. Pupils are equal, round, and reactive to light.  Neck: Normal range of motion. Neck supple.  Cardiovascular: Normal rate and regular rhythm.  Exam reveals no friction rub.   No murmur heard. Pulmonary/Chest: Effort normal and breath sounds normal. No respiratory distress. She has no wheezes. She has no rales.  Abdominal: Soft. She exhibits no distension. There is no tenderness. There is no rebound.  Musculoskeletal: Normal range of motion. She exhibits no edema.  Neurological: She is alert and oriented to person, place, and time.  Skin: She is not diaphoretic.  Nursing note and vitals reviewed.   ED Course  Procedures (including critical care time) Labs Review Labs Reviewed - No data to display  Imaging Review Ct Head Wo Contrast  10/10/2014   CLINICAL DATA:  Pt comes in by [facility] for headache x 2 months. Pt was recently released from mental health facility on Monday, but stated "they didn't care about that". Dizziness x 3 mths  EXAM: CT HEAD WITHOUT CONTRAST  TECHNIQUE: Contiguous axial images were obtained from the base of the skull through the vertex without intravenous contrast.   COMPARISON:  03/18/2013  FINDINGS: Ventricles normal in size and configuration. No parenchymal masses or mass effect. No evidence of an infarct. There are no extra-axial masses or abnormal fluid collections.  No intracranial hemorrhage.  Visualized sinuses and mastoid air cells are clear. No skull lesion.  IMPRESSION: Normal CT scan of the brain for age.   Electronically Signed   By: Amie Portland M.D.   On: 10/10/2014 21:15     EKG Interpretation None      MDM   Final diagnoses:  Headache    21F with hx of headaches, vertigo, chronic pain presents with headache for 3 weeks. Gradually worsening, similar to prior headaches. Denies vision problems, numbness/tingling. Patient states meds called, "cocktail" have worked before. Also having back pain, has chronic pain. No palpable muscle spasm or tenderness on my exam. Will give migraine cocktail. Feeling better after migraine cocktail. Normal Head CT. Stable for discharge.  Elwin Mocha, MD 10/11/14 [phone]

## 2014-10-15 ENCOUNTER — Encounter (HOSPITAL_COMMUNITY): Payer: Self-pay | Admitting: *Deleted

## 2014-10-15 ENCOUNTER — Emergency Department (HOSPITAL_COMMUNITY)
Admission: EM | Admit: 2014-10-15 | Discharge: 2014-10-15 | Disposition: A | Discharge status: Home / Self Care | Payer: Medicare Other | Attending: Emergency Medicine | Admitting: Emergency Medicine

## 2014-10-15 ENCOUNTER — Encounter: Payer: Self-pay | Admitting: Pharmacist

## 2014-10-15 DIAGNOSIS — F209 Schizophrenia, unspecified: Secondary | ICD-10-CM | POA: Insufficient documentation

## 2014-10-15 DIAGNOSIS — F419 Anxiety disorder, unspecified: Secondary | ICD-10-CM | POA: Insufficient documentation

## 2014-10-15 DIAGNOSIS — Z862 Personal history of diseases of the blood and blood-forming organs and certain disorders involving the immune mechanism: Secondary | ICD-10-CM | POA: Diagnosis not present

## 2014-10-15 DIAGNOSIS — Z7901 Long term (current) use of anticoagulants: Secondary | ICD-10-CM | POA: Diagnosis not present

## 2014-10-15 DIAGNOSIS — Z872 Personal history of diseases of the skin and subcutaneous tissue: Secondary | ICD-10-CM | POA: Insufficient documentation

## 2014-10-15 DIAGNOSIS — Z7982 Long term (current) use of aspirin: Secondary | ICD-10-CM | POA: Diagnosis not present

## 2014-10-15 DIAGNOSIS — Z791 Long term (current) use of non-steroidal anti-inflammatories (NSAID): Secondary | ICD-10-CM | POA: Diagnosis not present

## 2014-10-15 DIAGNOSIS — Z8701 Personal history of pneumonia (recurrent): Secondary | ICD-10-CM | POA: Insufficient documentation

## 2014-10-15 DIAGNOSIS — F329 Major depressive disorder, single episode, unspecified: Secondary | ICD-10-CM | POA: Diagnosis not present

## 2014-10-15 DIAGNOSIS — J45909 Unspecified asthma, uncomplicated: Secondary | ICD-10-CM | POA: Diagnosis not present

## 2014-10-15 DIAGNOSIS — I10 Essential (primary) hypertension: Secondary | ICD-10-CM | POA: Diagnosis not present

## 2014-10-15 DIAGNOSIS — M1990 Unspecified osteoarthritis, unspecified site: Secondary | ICD-10-CM | POA: Diagnosis not present

## 2014-10-15 DIAGNOSIS — R011 Cardiac murmur, unspecified: Secondary | ICD-10-CM | POA: Insufficient documentation

## 2014-10-15 DIAGNOSIS — M545 Low back pain, unspecified: Secondary | ICD-10-CM

## 2014-10-15 DIAGNOSIS — E669 Obesity, unspecified: Secondary | ICD-10-CM | POA: Diagnosis not present

## 2014-10-15 DIAGNOSIS — Z8669 Personal history of other diseases of the nervous system and sense organs: Secondary | ICD-10-CM | POA: Diagnosis not present

## 2014-10-15 DIAGNOSIS — G8929 Other chronic pain: Secondary | ICD-10-CM | POA: Insufficient documentation

## 2014-10-15 DIAGNOSIS — K219 Gastro-esophageal reflux disease without esophagitis: Secondary | ICD-10-CM | POA: Diagnosis not present

## 2014-10-15 DIAGNOSIS — R52 Pain, unspecified: Secondary | ICD-10-CM | POA: Diagnosis not present

## 2014-10-15 DIAGNOSIS — Z7951 Long term (current) use of inhaled steroids: Secondary | ICD-10-CM | POA: Diagnosis not present

## 2014-10-15 DIAGNOSIS — Z86718 Personal history of other venous thrombosis and embolism: Secondary | ICD-10-CM | POA: Diagnosis not present

## 2014-10-15 DIAGNOSIS — Z8639 Personal history of other endocrine, nutritional and metabolic disease: Secondary | ICD-10-CM | POA: Insufficient documentation

## 2014-10-15 DIAGNOSIS — Z79899 Other long term (current) drug therapy: Secondary | ICD-10-CM | POA: Diagnosis not present

## 2014-10-15 DIAGNOSIS — Z86711 Personal history of pulmonary embolism: Secondary | ICD-10-CM | POA: Diagnosis not present

## 2014-10-15 MED ORDER — HYDROMORPHONE HCL 1 MG/ML IJ SOLN
1.0000 mg | Freq: Once | INTRAMUSCULAR | Status: AC
  Administered 2014-10-15: 1 mg via INTRAMUSCULAR
  Filled 2014-10-15: qty 1

## 2014-10-15 MED ORDER — TRAMADOL HCL 50 MG PO TABS: 50.0000 mg | ORAL_TABLET | Freq: Four times a day (QID) | ORAL | Status: DC | PRN

## 2014-10-15 NOTE — Progress Notes (Signed)
Discharge summary recv'd from [facility] in [place]. During stay, she received Coumadin 7.5 mg on Fri (10/01/14) and Sat (10/02/14) only, then 5 mg daily after that as maintenance dosed. No med changes that should impact INR. [facility] at [name] w/ back pain today (10/15/14).  No INR drawn/documented yet. [facility].  We will try to contact her by phone and sched appt soon. Transportation is a real issue for [name] [name] & is a stressor for her emotional health.  She expressed need for help w/ transportation when at [facility]. I have contacted our social worker, Kathrin PennerLauren Mullis (left voicemail) to see if she can help in any way. Ebony HailGinna Ina Poupard, Pharm.D., CPP 10/15/2014@8 :09 AM

## 2014-10-15 NOTE — ED Provider Notes (Signed)
CSN: [id number]     Arrival date & time 10/15/14  0702 History   First MD Initiated Contact with Patient 10/15/14 [phone]     Chief Complaint  Patient presents with  . Back Pain     (Consider location/radiation/quality/duration/timing/severity/associated sxs/prior Treatment) HPI   74 year old female with history of chronic back pain, schizophrenia, depression, DVT currently on warfarin presents for evaluation of back pain. Patient reports she has a son with history of seizures. A week and half ago her son had a seizure and she was trying to help him get in the bed and from all the pulling and tugging she developed worsening low back pain. Describe pain is similar to her prior chronic back pain. Pain is sharp throbbing and radiates throughout her body. Pain is worsened when she lays flat and improves when she sits in a recliner. She is still able to perform her daily activities and able to walk around but today the pain is worsening before. She called EMS to bring her to the ER. Patient states that she normally had a sharp pain medication when her pain is severe and that seems to help. She denies having fever, chills, bowel bladder incontinence, saddle anesthesia, dysuria, hematuria, numbness or weakness. Patient without any other complaints.  Past Medical History  Diagnosis Date  . Depression   . Schizophrenia   . Hyperlipidemia   . Hypertension   . Chronic pain     "over my whole body" (03/30/2013)  . 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 DVT (deep venous thrombosis)     per 01/17/10 d/c summary- "remote hx of dvt"?  . Iron deficiency anemia   . Glaucoma   . Hemorrhoids   . Asthma   . Anxiety   . GERD (gastroesophageal reflux disease)   . Psoriasis 04/29/2012    New onset evaluated by Dr Marylou FlesherWilliam Huang, Dermatology, [facility] 6/13  Rx 0.1% Tacrolimus ointment  . Complication of anesthesia     "because I have sleep apnea" (03/30/2013)  . Heart murmur   . Chest  pain, exertional   . Chronic bronchitis   . Sleep apnea     "waiting on my CPAP" (03/30/2013)  . Pneumonia     "a few times" (03/30/2013)  . Exertional shortness of breath     "& sometimes when laying down" (03/30/2013)  . Daily headache     "last 2 months" (03/30/2013)  . Arthritis     "joints" (03/30/2013)  . Varicose veins of legs   . Psoriasis    Past Surgical History  Procedure Laterality Date  . Spinal fusion      2004  . Carpal tunnel release Bilateral   . Tubal ligation  1973  . Dilation and curettage of uterus  1970's    "once" (03/30/2013)  . 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 Joint replacement    . Vein surgery  left leg   Family History  Problem Relation Age of Onset  . Diabetes Sister   . Colon cancer Brother 40  . Colon cancer Brother    History  Substance Use Topics  . Smoking status: Never Smoker   . Smokeless tobacco: Never Used  . Alcohol Use: No   OB History    No data available     Review of Systems  Constitutional: Negative for fever.  Gastrointestinal: Negative for abdominal pain.  Musculoskeletal: Positive for back pain.  Skin: Negative for rash and wound.  Neurological: Negative for numbness.      Allergies  Other  Home Medications   Prior to Admission medications   Medication Sig Start Date End Date Taking? Authorizing Provider  acetaminophen (TYLENOL) 500 MG tablet Take 1,000 mg by mouth every 6 (six) hours as needed for mild pain (pain).     Historical Provider, MD  albuterol (PROVENTIL HFA;VENTOLIN HFA) 108 (90 BASE) MCG/ACT inhaler Inhale 2 puffs into the lungs every 6 (six) hours as needed for wheezing or shortness of breath (wheezing).    Historical Provider, MD  aspirin 325 MG tablet Take 650 mg by mouth 3 (three) times daily as needed for headache.    Historical Provider, MD  azithromycin (ZITHROMAX) 250 MG tablet Take 1 tablet (250 mg total) by mouth daily. 1 tablet q daily starting 09/12/14 Patient not taking: Reported on  09/20/2014 09/11/14   Derwood Kaplan, MD  beclomethasone (QVAR) 80 MCG/ACT inhaler Inhale 2 puffs into the lungs 2 (two) times daily.    Historical Provider, MD  busPIRone (BUSPAR) 10 MG tablet Take 10 mg by mouth 3 (three) times daily.    Historical Provider, MD  FLUoxetine (PROZAC) 20 MG capsule Take 3 capsules (60 mg total) by mouth daily. 05/26/14   Tyrone Nine, MD  fluticasone (FLOVENT HFA) 220 MCG/ACT inhaler Inhale 2 puffs into the lungs 2 (two) times daily.    Historical Provider, MD  furosemide (LASIX) 20 MG tablet TAKE 1 TABLET BY MOUTH EVERY DAY 09/07/14   Tyrone Nine, MD  gabapentin (NEURONTIN) 100 MG capsule Take 200 mg by mouth 3 (three) times daily.    Historical Provider, MD  guaiFENesin-codeine 100-10 MG/5ML syrup Take 5 mLs by mouth every 6 (six) hours as needed for cough. 09/11/14   Derwood Kaplan, MD  hydrochlorothiazide (HYDRODIURIL) 25 MG tablet Take 25 mg by mouth daily.    Historical Provider, MD  hydrOXYzine (ATARAX/VISTARIL) 10 MG tablet Take 10 mg by mouth at bedtime as needed for itching (itching).     Historical Provider, MD  meclizine (ANTIVERT) 25 MG tablet Take 25 mg by mouth 3 (three) times daily as needed for dizziness (dizziness).     Historical Provider, MD  Misc Natural Products (ESTROVEN ENERGY PO) Take 1 tablet by mouth daily.    Historical Provider, MD  montelukast (SINGULAIR) 10 MG tablet Take 1 tablet (10 mg total) by mouth at bedtime. 05/11/14   Tyrone Nine, MD  naproxen sodium (ANAPROX) 220 MG tablet Take 660 mg by mouth 2 (two) times daily.    Historical Provider, MD  omeprazole (PRILOSEC) 40 MG capsule Take 1 capsule (40 mg total) by mouth daily. 08/31/14   Arby Barrette, MD  predniSONE (DELTASONE) 50 MG tablet Take 1 tablet (50 mg total) by mouth daily. Patient not taking: Reported on 09/20/2014 09/11/14   Derwood Kaplan, MD  silver sulfADIAZINE (SILVADENE) 1 % cream APPLY 1 APPLICATION TOPICALLY DAILY. Patient not taking: Reported on 10/10/2014  08/06/14   Tyrone Nine, MD  sucralfate (CARAFATE) 1 G tablet Take 1 tablet (1 g total) by mouth 4 (four) times daily. Patient not taking: Reported on 10/10/2014 05/11/14   Tyrone Nine, MD  tacrolimus (PROTOPIC) 0.1 % ointment Apply 1 application topically 3 (three) times daily as needed (for legs). Apply to affected areas daily PRN 03/01/14 03/01/15  Historical Provider, MD  traMADol (ULTRAM) 50 MG tablet Take 1 tablet (50 mg total) by mouth every 6 (six) hours as needed. 10/10/14   Elwin Mocha,  MD  traMADol (ULTRAM) 50 MG tablet Take 1 tablet (50 mg total) by mouth every 6 (six) hours as needed for severe pain. 10/10/14   Elwin Mocha, MD  triamcinolone cream (KENALOG) 0.1 % Apply 1 application topically 2 (two) times daily as needed (for legs).     Historical Provider, MD  warfarin (COUMADIN) 5 MG tablet Take 1 tablet (5 mg total) by mouth daily. 01/28/14   Levert Feinstein, MD   LMP 11/22/1996 Physical Exam  Constitutional: She appears well-developed and well-nourished. No distress.  Moderately obese African-American female appears to be in no acute distress and nontoxic in appearance. She is sitting up in her bed communicating without difficulty.  HENT:  Head: Atraumatic.  Eyes: Conjunctivae are normal.  Neck: Normal range of motion. Neck supple.  Abdominal: Soft. There is no tenderness.  Musculoskeletal: She exhibits tenderness (Paralumbar spinal muscle tenderness on palpation with decreased back extension and rotation secondary to pain, no overlying skin changes.).  Neurological: She is alert. She has normal reflexes.  Ambulate with a slow gait but no antalgic gait  Skin: No rash noted.  Psychiatric: She has a normal mood and affect.  Nursing note and vitals reviewed.   ED Course  Procedures (including critical care time)   Patient with acute on chronic low back pain. No red flags. She is on warfarin for DVT. No evidence to suggest DVT on exam. Patient requests for pain medication,  will provide symptomatically treatment and patient will follow-up closely with her doctor for further care. Suspects muscular skeletal pain in origin.  Labs Review Labs Reviewed - No data to display  Imaging Review No results found.   EKG Interpretation None      MDM   Final diagnoses:  Chronic low back pain    BP 123/88 mmHg  Pulse 60  Temp(Src) 98 F (36.7 C) (Oral)  Resp 16  SpO2 94%  LMP 11/22/1996     Fayrene Helper, PA-C 10/15/14 1512  Tilden Fossa, MD 10/18/14 1451

## 2014-10-15 NOTE — ED Notes (Signed)
Pt reports back pain and leg pain that started a month ago. Pt ambulatory to bed with no assistance.

## 2014-10-15 NOTE — Discharge Instructions (Signed)

## 2014-10-15 NOTE — Progress Notes (Signed)
Thanks!  Her sister-in-law is a Agricultural engineernursing assistant at the [facility]!

## 2014-10-16 ENCOUNTER — Other Ambulatory Visit: Payer: Self-pay | Admitting: Oncology

## 2014-10-18 ENCOUNTER — Ambulatory Visit: Payer: Medicare Other | Admitting: Family Medicine

## 2014-10-18 ENCOUNTER — Telehealth: Payer: Self-pay | Admitting: Pharmacist

## 2014-10-18 NOTE — Telephone Encounter (Signed)
I s/w [name] [name] over phone to resched lab/Coumadin clinic appt - she will come 10/29/14. She has to make some "big decisions" soon - she is placing her son in a facility.  She has a case manager helping her & she will be visiting the facilities soon. She states she needs surgery on her back and wants to get her son somewhere before she has surgery. She is trying to get an apartment alone, away from her husband [name] [name] also.   Pt states she does have someone she can call to [facility]. Ebony HailGinna Juliette Standre, Pharm.D., CPP 10/18/2014@4 :49 PM

## 2014-10-19 ENCOUNTER — Emergency Department (HOSPITAL_COMMUNITY)
Admission: EM | Admit: 2014-10-19 | Discharge: 2014-10-19 | Disposition: A | Discharge status: Home / Self Care | Payer: Medicare Other | Attending: Emergency Medicine | Admitting: Emergency Medicine

## 2014-10-19 ENCOUNTER — Encounter (HOSPITAL_COMMUNITY): Payer: Self-pay | Admitting: *Deleted

## 2014-10-19 ENCOUNTER — Emergency Department (HOSPITAL_COMMUNITY): Payer: Medicare Other

## 2014-10-19 DIAGNOSIS — F419 Anxiety disorder, unspecified: Secondary | ICD-10-CM | POA: Insufficient documentation

## 2014-10-19 DIAGNOSIS — R51 Headache: Secondary | ICD-10-CM | POA: Diagnosis not present

## 2014-10-19 DIAGNOSIS — Z8701 Personal history of pneumonia (recurrent): Secondary | ICD-10-CM | POA: Insufficient documentation

## 2014-10-19 DIAGNOSIS — Z791 Long term (current) use of non-steroidal anti-inflammatories (NSAID): Secondary | ICD-10-CM | POA: Diagnosis not present

## 2014-10-19 DIAGNOSIS — Z86718 Personal history of other venous thrombosis and embolism: Secondary | ICD-10-CM | POA: Diagnosis not present

## 2014-10-19 DIAGNOSIS — R011 Cardiac murmur, unspecified: Secondary | ICD-10-CM | POA: Insufficient documentation

## 2014-10-19 DIAGNOSIS — M1990 Unspecified osteoarthritis, unspecified site: Secondary | ICD-10-CM | POA: Insufficient documentation

## 2014-10-19 DIAGNOSIS — I10 Essential (primary) hypertension: Secondary | ICD-10-CM | POA: Insufficient documentation

## 2014-10-19 DIAGNOSIS — M545 Low back pain: Secondary | ICD-10-CM | POA: Diagnosis not present

## 2014-10-19 DIAGNOSIS — Z86711 Personal history of pulmonary embolism: Secondary | ICD-10-CM | POA: Insufficient documentation

## 2014-10-19 DIAGNOSIS — Z8669 Personal history of other diseases of the nervous system and sense organs: Secondary | ICD-10-CM | POA: Insufficient documentation

## 2014-10-19 DIAGNOSIS — F209 Schizophrenia, unspecified: Secondary | ICD-10-CM | POA: Insufficient documentation

## 2014-10-19 DIAGNOSIS — Z862 Personal history of diseases of the blood and blood-forming organs and certain disorders involving the immune mechanism: Secondary | ICD-10-CM | POA: Diagnosis not present

## 2014-10-19 DIAGNOSIS — Z7982 Long term (current) use of aspirin: Secondary | ICD-10-CM | POA: Diagnosis not present

## 2014-10-19 DIAGNOSIS — Z79899 Other long term (current) drug therapy: Secondary | ICD-10-CM | POA: Insufficient documentation

## 2014-10-19 DIAGNOSIS — Z872 Personal history of diseases of the skin and subcutaneous tissue: Secondary | ICD-10-CM | POA: Diagnosis not present

## 2014-10-19 DIAGNOSIS — K219 Gastro-esophageal reflux disease without esophagitis: Secondary | ICD-10-CM | POA: Insufficient documentation

## 2014-10-19 DIAGNOSIS — Z7951 Long term (current) use of inhaled steroids: Secondary | ICD-10-CM | POA: Diagnosis not present

## 2014-10-19 DIAGNOSIS — F329 Major depressive disorder, single episode, unspecified: Secondary | ICD-10-CM | POA: Insufficient documentation

## 2014-10-19 DIAGNOSIS — G8929 Other chronic pain: Secondary | ICD-10-CM | POA: Diagnosis not present

## 2014-10-19 DIAGNOSIS — M5136 Other intervertebral disc degeneration, lumbar region: Secondary | ICD-10-CM | POA: Diagnosis not present

## 2014-10-19 DIAGNOSIS — M549 Dorsalgia, unspecified: Secondary | ICD-10-CM

## 2014-10-19 DIAGNOSIS — Z7901 Long term (current) use of anticoagulants: Secondary | ICD-10-CM | POA: Insufficient documentation

## 2014-10-19 DIAGNOSIS — J45909 Unspecified asthma, uncomplicated: Secondary | ICD-10-CM | POA: Diagnosis not present

## 2014-10-19 LAB — URINALYSIS, ROUTINE W REFLEX MICROSCOPIC
BILIRUBIN URINE: NEGATIVE
Glucose, UA: NEGATIVE mg/dL
Hgb urine dipstick: NEGATIVE
Ketones, ur: NEGATIVE mg/dL
Nitrite: NEGATIVE
Protein, ur: NEGATIVE mg/dL
Specific Gravity, Urine: 1.01 (ref 1.005–1.030)
UROBILINOGEN UA: 0.2 mg/dL (ref 0.0–1.0)
pH: 7.5 (ref 5.0–8.0)

## 2014-10-19 LAB — URINE MICROSCOPIC-ADD ON

## 2014-10-19 MED ORDER — METOCLOPRAMIDE HCL 5 MG/ML IJ SOLN
10.0000 mg | Freq: Once | INTRAMUSCULAR | Status: AC
  Administered 2014-10-19: 10 mg via INTRAVENOUS
  Filled 2014-10-19: qty 2

## 2014-10-19 MED ORDER — TRAMADOL HCL 50 MG PO TABS: 50.0000 mg | ORAL_TABLET | Freq: Four times a day (QID) | ORAL | Status: DC | PRN

## 2014-10-19 MED ORDER — DIPHENHYDRAMINE HCL 50 MG/ML IJ SOLN
12.5000 mg | Freq: Once | INTRAMUSCULAR | Status: AC
  Administered 2014-10-19: 12.5 mg via INTRAVENOUS
  Filled 2014-10-19: qty 1

## 2014-10-19 NOTE — ED Notes (Signed)
Pt has chronic back pain since back surgery years ago, "feels like a rock is sitting inside of her." no distress noted at triage.

## 2014-10-19 NOTE — ED Notes (Signed)
Pt states she also has migraine on and off for last 3 months.

## 2014-10-19 NOTE — Discharge Instructions (Signed)
Read the information below.  You may return to the Emergency Department at any time for worsening condition or any new symptoms that concern you.   If you develop fevers, loss of control of bowel or bladder, weakness or numbness in your legs, or are unable to walk, return to the ER for a recheck.   You are having a headache. No specific cause was found today for your headache. It may have been a migraine or other cause of headache. Stress, anxiety, fatigue, and depression are common triggers for headaches. Your headache today does not appear to be life-threatening or require hospitalization, but often the exact cause of headaches is not determined in the emergency department. Therefore, follow-up with your doctor is very important to find out what may have caused your headache, and whether or not you need any further diagnostic testing or treatment. Sometimes headaches can appear benign (not harmful), but then more serious symptoms can develop which should prompt an immediate re-evaluation by your doctor or the emergency department. SEEK MEDICAL ATTENTION IF: You develop possible problems with medications prescribed.  The medications don't resolve your headache, if it recurs , or if you have multiple episodes of vomiting or can't take fluids. You have a change from the usual headache. RETURN IMMEDIATELY IF you develop a sudden, severe headache or confusion, become poorly responsive or faint, develop a fever above 100.31F or problem breathing, have a change in speech, vision, swallowing, or understanding, or develop new weakness, numbness, tingling, incoordination, or have a seizure.

## 2014-10-19 NOTE — ED Provider Notes (Signed)
CSN: [id number]     Arrival date & time 10/19/14  1039 History   First MD Initiated Contact with Patient 10/19/14 1410     Chief Complaint  Patient presents with  . Back Pain     (Consider location/radiation/quality/duration/timing/severity/associated sxs/prior Treatment) HPI   Pt with hx chronic pain, psychiatric disorders p/w 2 chronic complaints.    Complains of low back pain that has been ongoing and gradually worsening x 20 years.  States she had a spinal fusion surgery 20 years ago and has since had chronic low back pain with radiation down both legs and chronic tingling and numbness in both legs.  She now feels like she has a "rock" inside her back.  Denies fevers, chills, abdominal pain, loss of control of bowel or bladder, weakness of the extremities, saddle anesthesia, bowel or urinary complaints.    She also complains of headache that has been ongoing for 3-4 months since her husband was in an MVC and suffered memory loss and became more high maintenance for her at home.  The pain is in her occiput and the top of her head.  Associated nausea, blurry vision.  States she can't brush her hair because her scalp is tender.  She was seen in ED 10/10/14 for same and states the medicine given then helped temporarily.  States pain is unchanged x months.  Had negative Head CT during 10/10/14 visit.   Pt recently admitted for SI, HI.  She states she is feeling much better from this standpoint.  Denies current SI, HI.    Past Medical History  Diagnosis Date  . Depression   . Schizophrenia   . Hyperlipidemia   . Hypertension   . Chronic pain     "over my whole body" (03/30/2013)  . 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 DVT (deep venous thrombosis)     per 01/17/10 d/c summary- "remote hx of dvt"?  . Iron deficiency anemia   . Glaucoma   . Hemorrhoids   . Asthma   . Anxiety   . GERD (gastroesophageal reflux disease)   . Psoriasis 04/29/2012    New onset evaluated  by Dr Marylou Flesher, Dermatology, [facility] 6/13  Rx 0.1% Tacrolimus ointment  . Complication of anesthesia     "because I have sleep apnea" (03/30/2013)  . Heart murmur   . Chest pain, exertional   . Chronic bronchitis   . Sleep apnea     "waiting on my CPAP" (03/30/2013)  . Pneumonia     "a few times" (03/30/2013)  . Exertional shortness of breath     "& sometimes when laying down" (03/30/2013)  . Daily headache     "last 2 months" (03/30/2013)  . Arthritis     "joints" (03/30/2013)  . Varicose veins of legs   . Psoriasis    Past Surgical History  Procedure Laterality Date  . Spinal fusion      2004  . Carpal tunnel release Bilateral   . Tubal ligation  1973  . Dilation and curettage of uterus  1970's    "once" (03/30/2013)  . 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 Joint replacement    . Vein surgery  left leg   Family History  Problem Relation Age of Onset  . Diabetes Sister   . Colon cancer Brother 40  . Colon cancer Brother    History  Substance Use Topics  . Smoking status: Never Smoker   .  Smokeless tobacco: Never Used  . Alcohol Use: No   OB History    No data available     Review of Systems  All other systems reviewed and are negative.     Allergies  Other  Home Medications   Prior to Admission medications   Medication Sig Start Date End Date Taking? Authorizing Provider  acetaminophen (TYLENOL) 500 MG tablet Take 1,000 mg by mouth every 6 (six) hours as needed for mild pain (pain).     Historical Provider, MD  albuterol (PROVENTIL HFA;VENTOLIN HFA) 108 (90 BASE) MCG/ACT inhaler Inhale 2 puffs into the lungs every 6 (six) hours as needed for wheezing or shortness of breath (wheezing).    Historical Provider, MD  aspirin 325 MG tablet Take 650 mg by mouth 3 (three) times daily as needed for headache.    Historical Provider, MD  atenolol (TENORMIN) 100 MG tablet Take 100 mg by mouth daily.    Historical Provider, MD  beclomethasone (QVAR) 80 MCG/ACT inhaler  Inhale 2 puffs into the lungs 2 (two) times daily.    Historical Provider, MD  busPIRone (BUSPAR) 10 MG tablet Take 10 mg by mouth 3 (three) times daily.    Historical Provider, MD  FLUoxetine (PROZAC) 20 MG capsule Take 3 capsules (60 mg total) by mouth daily. 05/26/14   Tyrone Nineyan B Grunz, MD  fluticasone (FLOVENT HFA) 220 MCG/ACT inhaler Inhale 2 puffs into the lungs 2 (two) times daily.    Historical Provider, MD  furosemide (LASIX) 20 MG tablet TAKE 1 TABLET BY MOUTH EVERY DAY 09/07/14   Tyrone Nineyan B Grunz, MD  gabapentin (NEURONTIN) 100 MG capsule Take 200 mg by mouth 3 (three) times daily.    Historical Provider, MD  guaiFENesin-codeine 100-10 MG/5ML syrup Take 5 mLs by mouth every 6 (six) hours as needed for cough. 09/11/14   Derwood KaplanAnkit Nanavati, MD  hydrochlorothiazide (HYDRODIURIL) 25 MG tablet Take 25 mg by mouth daily.    Historical Provider, MD  hydrOXYzine (ATARAX/VISTARIL) 10 MG tablet Take 10 mg by mouth at bedtime as needed for itching (itching).     Historical Provider, MD  lisinopril (PRINIVIL,ZESTRIL) 10 MG tablet Take 10 mg by mouth daily.    Historical Provider, MD  meclizine (ANTIVERT) 25 MG tablet Take 25 mg by mouth 3 (three) times daily as needed for dizziness (dizziness).     Historical Provider, MD  Misc Natural Products (ESTROVEN ENERGY PO) Take 1 tablet by mouth daily.    Historical Provider, MD  naproxen sodium (ANAPROX) 220 MG tablet Take 660 mg by mouth 2 (two) times daily.    Historical Provider, MD  omeprazole (PRILOSEC) 40 MG capsule Take 1 capsule (40 mg total) by mouth daily. 08/31/14   Arby BarretteMarcy Pfeiffer, MD  risperiDONE (RISPERDAL) 0.5 MG tablet Take 0.5 mg by mouth at bedtime.    Historical Provider, MD  tacrolimus (PROTOPIC) 0.1 % ointment Apply 1 application topically 3 (three) times daily as needed (for legs). Apply to affected areas daily PRN 03/01/14 03/01/15  Historical Provider, MD  traMADol (ULTRAM) 50 MG tablet Take 1 tablet (50 mg total) by mouth every 6 (six) hours as needed  for moderate pain. 10/15/14   Fayrene HelperBowie Tran, PA-C  triamcinolone cream (KENALOG) 0.1 % Apply 1 application topically 2 (two) times daily as needed (for legs).     Historical Provider, MD  warfarin (COUMADIN) 5 MG tablet TAKE 1 TABLET BY MOUTH EVERY DAY 10/18/14   Levert FeinsteinJames M Granfortuna, MD   BP 124/64 mmHg  Pulse 60  Temp(Src) 98 F (36.7 C) (Oral)  Resp 15  SpO2 96%  LMP 11/22/1996 Physical Exam  Constitutional: She appears well-developed and well-nourished. No distress.  HENT:  Head: Normocephalic and atraumatic.  Neck: Neck supple.  Cardiovascular: Normal rate and regular rhythm.   Pulmonary/Chest: Effort normal and breath sounds normal. No respiratory distress. She has no wheezes. She has no rales.  Abdominal: Soft. She exhibits no distension. There is no tenderness. There is no rebound and no guarding.  Musculoskeletal:  Diffuse tenderness through spine.  No crepitus or stepoffs.  No focal tenderness.   Lower extremities:  Strength 5/5, sensation intact, distal pulses intact.     Neurological: She is alert. She has normal strength. No cranial nerve deficit or sensory deficit. GCS eye subscore is 4. GCS verbal subscore is 5. GCS motor subscore is 6.  CN II-XII intact, EOMs intact, no pronator drift, grip strengths equal bilaterally; strength 5/5 in all extremities, sensation intact in all extremities; finger to nose, heel to shin, rapid alternating movements normal.  Skin: She is not diaphoretic.  Nursing note and vitals reviewed.   ED Course  Procedures (including critical care time) Labs Review Labs Reviewed  URINALYSIS, ROUTINE W REFLEX MICROSCOPIC - Abnormal; Notable for the following:    Leukocytes, UA TRACE (*)    All other components within normal limits  URINE CULTURE  URINE MICROSCOPIC-ADD ON    Imaging Review Dg Lumbar Spine Complete  10/19/2014   CLINICAL DATA:  Chronic low back pain  EXAM: LUMBAR SPINE - COMPLETE 4+ VIEW  COMPARISON:  July 30, 2011  FINDINGS:  Frontal, lateral, spot lumbosacral lateral, and bilateral oblique views were obtained. There are 5 non-rib-bearing lumbar type vertebral bodies. The patient is status post pedicle screw and plate fixation at L4 and L5 bilaterally. The pedicle screws and plates as well as a disc spacer at L4-5 appear intact. There is no fracture or spondylolisthesis. There is moderately severe disc space narrowing at L5-S1 with vacuum phenomenon. There is moderate narrowing at L2-3 and L3-4, slightly progressed at L2-3 and stable at L3-4. No erosive change. There is facet osteoarthritic change at L3-4, L4-5, and L5-S1 bilaterally.  IMPRESSION: Slight increase in disc space narrowing at L2-3 compared to prior study. Osteoarthritic change at other levels stable. Postoperative changes several levels, stable. No fracture or spondylolisthesis.   Electronically Signed   By: Bretta Bang M.D.   On: 10/19/2014 15:56     EKG Interpretation None       4:27 PM Pt reports headache is much improved.   MDM   Final diagnoses:  Chronic back pain  Chronic nonintractable headache, unspecified headache type    Afebrile, nontoxic patient with chronic back pain and chronic headache.  No changes, no red flags.  Lumbar spine film with worsening chronic changes, disc space narrowing L2-3.  UA negative.  Pt feeling better after benadryl and reglan.  D/C home with PCP, neurosurgery follow up,  tramadol.  Discussed result, findings, treatment, and follow up  with patient.  Pt given return precautions.  Pt verbalizes understanding and agrees with plan.         Trixie Dredge, PA-C 10/19/14 1651  Enid Skeens, MD 10/19/14 [phone]

## 2014-10-20 ENCOUNTER — Encounter: Payer: Self-pay | Admitting: *Deleted

## 2014-10-20 LAB — URINE CULTURE

## 2014-10-20 NOTE — Progress Notes (Signed)
[facility] Clinical Social Work  Clinical Social Work was referred by Ebony HailGinna Tucker, pharmacist [facility] for transportation assistance.  Clinical Social Worker contacted patient by phone to offer support and assess for needs.  [name] [name] shared she does need assistance with transportation because her spouse can no longer drive and they are trying to apply for SCAT.  CSW explained SCAT process to patient and her daughter.  [name] [name] reported herself, her spouse, and her son all need to apply.  CSW advised patient to complete patient portion for each member of family and then bring to each patient's primary care provider to complete healthcare portion.  Patient verbalized understanding and plans to contact CSW if she has additional questions or concerns.     [name] [name], MSW, LCSW, OSW-C Clinical Social Worker [facility] [phone]

## 2014-10-21 ENCOUNTER — Telehealth: Payer: Self-pay | Admitting: Clinical

## 2014-10-21 NOTE — Telephone Encounter (Signed)
CS[facility] that [facility] is in need of transportation resources and is interested in applying for SCAT. CSW called pt and left her a message.  Theresia BoughNorma Venera Privott, MSW, LCSW [phone]

## 2014-10-21 NOTE — Telephone Encounter (Signed)
Error

## 2014-10-25 ENCOUNTER — Emergency Department (HOSPITAL_COMMUNITY)
Admission: EM | Admit: 2014-10-25 | Discharge: 2014-10-25 | Disposition: A | Discharge status: Home / Self Care | Payer: Medicare Other | Attending: Emergency Medicine | Admitting: Emergency Medicine

## 2014-10-25 ENCOUNTER — Emergency Department (HOSPITAL_COMMUNITY)
Admission: EM | Admit: 2014-10-25 | Discharge: 2014-10-25 | Disposition: A | Discharge status: Home / Self Care | Payer: Medicare Other | Source: Home / Self Care | Attending: Emergency Medicine | Admitting: Emergency Medicine

## 2014-10-25 ENCOUNTER — Encounter (HOSPITAL_COMMUNITY): Payer: Self-pay | Admitting: Neurology

## 2014-10-25 ENCOUNTER — Encounter (HOSPITAL_COMMUNITY): Payer: Self-pay | Admitting: *Deleted

## 2014-10-25 DIAGNOSIS — Z872 Personal history of diseases of the skin and subcutaneous tissue: Secondary | ICD-10-CM

## 2014-10-25 DIAGNOSIS — Z7951 Long term (current) use of inhaled steroids: Secondary | ICD-10-CM

## 2014-10-25 DIAGNOSIS — Z86711 Personal history of pulmonary embolism: Secondary | ICD-10-CM | POA: Insufficient documentation

## 2014-10-25 DIAGNOSIS — Z7901 Long term (current) use of anticoagulants: Secondary | ICD-10-CM | POA: Insufficient documentation

## 2014-10-25 DIAGNOSIS — R001 Bradycardia, unspecified: Secondary | ICD-10-CM | POA: Insufficient documentation

## 2014-10-25 DIAGNOSIS — M545 Low back pain, unspecified: Secondary | ICD-10-CM

## 2014-10-25 DIAGNOSIS — K219 Gastro-esophageal reflux disease without esophagitis: Secondary | ICD-10-CM | POA: Insufficient documentation

## 2014-10-25 DIAGNOSIS — Z79899 Other long term (current) drug therapy: Secondary | ICD-10-CM | POA: Insufficient documentation

## 2014-10-25 DIAGNOSIS — Z8701 Personal history of pneumonia (recurrent): Secondary | ICD-10-CM | POA: Insufficient documentation

## 2014-10-25 DIAGNOSIS — R531 Weakness: Secondary | ICD-10-CM | POA: Diagnosis not present

## 2014-10-25 DIAGNOSIS — Z7982 Long term (current) use of aspirin: Secondary | ICD-10-CM | POA: Insufficient documentation

## 2014-10-25 DIAGNOSIS — H409 Unspecified glaucoma: Secondary | ICD-10-CM | POA: Insufficient documentation

## 2014-10-25 DIAGNOSIS — Z862 Personal history of diseases of the blood and blood-forming organs and certain disorders involving the immune mechanism: Secondary | ICD-10-CM | POA: Insufficient documentation

## 2014-10-25 DIAGNOSIS — M1990 Unspecified osteoarthritis, unspecified site: Secondary | ICD-10-CM

## 2014-10-25 DIAGNOSIS — I10 Essential (primary) hypertension: Secondary | ICD-10-CM

## 2014-10-25 DIAGNOSIS — Z86718 Personal history of other venous thrombosis and embolism: Secondary | ICD-10-CM | POA: Insufficient documentation

## 2014-10-25 DIAGNOSIS — F419 Anxiety disorder, unspecified: Secondary | ICD-10-CM | POA: Insufficient documentation

## 2014-10-25 DIAGNOSIS — G8929 Other chronic pain: Secondary | ICD-10-CM | POA: Insufficient documentation

## 2014-10-25 DIAGNOSIS — F209 Schizophrenia, unspecified: Secondary | ICD-10-CM | POA: Insufficient documentation

## 2014-10-25 DIAGNOSIS — F329 Major depressive disorder, single episode, unspecified: Secondary | ICD-10-CM

## 2014-10-25 DIAGNOSIS — Z8639 Personal history of other endocrine, nutritional and metabolic disease: Secondary | ICD-10-CM

## 2014-10-25 DIAGNOSIS — J45909 Unspecified asthma, uncomplicated: Secondary | ICD-10-CM | POA: Insufficient documentation

## 2014-10-25 DIAGNOSIS — Z9981 Dependence on supplemental oxygen: Secondary | ICD-10-CM | POA: Insufficient documentation

## 2014-10-25 DIAGNOSIS — M549 Dorsalgia, unspecified: Principal | ICD-10-CM

## 2014-10-25 DIAGNOSIS — G4730 Sleep apnea, unspecified: Secondary | ICD-10-CM

## 2014-10-25 DIAGNOSIS — Z7952 Long term (current) use of systemic steroids: Secondary | ICD-10-CM | POA: Diagnosis not present

## 2014-10-25 DIAGNOSIS — R200 Anesthesia of skin: Secondary | ICD-10-CM | POA: Insufficient documentation

## 2014-10-25 DIAGNOSIS — Z791 Long term (current) use of non-steroidal anti-inflammatories (NSAID): Secondary | ICD-10-CM | POA: Insufficient documentation

## 2014-10-25 DIAGNOSIS — R011 Cardiac murmur, unspecified: Secondary | ICD-10-CM | POA: Insufficient documentation

## 2014-10-25 DIAGNOSIS — R6889 Other general symptoms and signs: Secondary | ICD-10-CM | POA: Diagnosis not present

## 2014-10-25 MED ORDER — TRAMADOL HCL 50 MG PO TABS
100.0000 mg | ORAL_TABLET | Freq: Once | ORAL | Status: AC
Start: 2014-10-25 — End: 2014-10-25
  Administered 2014-10-25: 100 mg via ORAL
  Filled 2014-10-25: qty 2

## 2014-10-25 NOTE — ED Notes (Signed)
Pt reports chronic lower back pain, also vertigo. Had an appointment with PCP, but was cancelled due to the snow last week. Is out of pain medication and needs rx until she can see doctor. Pt is a x 4.

## 2014-10-25 NOTE — ED Provider Notes (Signed)
CSN: [id number]     Arrival date & time 10/25/14  1114 History   First MD Initiated Contact with Patient 10/25/14 1143     Chief Complaint  Patient presents with  . Back Pain     (Consider location/radiation/quality/duration/timing/severity/associated sxs/prior Treatment) HPI   [name] [name] is a 74 y.o. female who is here for evaluation of low back pain, recurrent.  She denies specific trauma.  She believes that bending over to help with her sons feeding tube is the cause of her discomfort.  She denies paresthesias, weakness, nausea, vomiting, fever, chills.  She is taking her usual medications, without relief.  She has received narcotics, 3 different times in ED visits in the last 6 weeks.  She has scheduled an appointment with her back doctor, but it is in several weeks from now.  There are no other known modifying factors.   Past Medical History  Diagnosis Date  . Depression   . Schizophrenia   . Hyperlipidemia   . Hypertension   . Chronic pain     "over my whole body" (03/30/2013)  . 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 DVT (deep venous thrombosis)     per 01/17/10 d/c summary- "remote hx of dvt"?  . Iron deficiency anemia   . Glaucoma   . Hemorrhoids   . Asthma   . Anxiety   . GERD (gastroesophageal reflux disease)   . Psoriasis 04/29/2012    New onset evaluated by Dr Marylou FlesherWilliam Huang, [facility]  Rx 0.1% Tacrolimus ointment  . Complication of anesthesia     "because I have sleep apnea" (03/30/2013)  . Heart murmur   . Chest pain, exertional   . Chronic bronchitis   . Sleep apnea     "waiting on my CPAP" (03/30/2013)  . Pneumonia     "a few times" (03/30/2013)  . Exertional shortness of breath     "& sometimes when laying down" (03/30/2013)  . Daily headache     "last 2 months" (03/30/2013)  . Arthritis     "joints" (03/30/2013)  . Varicose veins of legs   . Psoriasis    Past Surgical History  Procedure Laterality Date  . Spinal  fusion      2004  . Carpal tunnel release Bilateral   . Tubal ligation  1973  . Dilation and curettage of uterus  1970's    "once" (03/30/2013)  . 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 Joint replacement    . Vein surgery  left leg   Family History  Problem Relation Age of Onset  . Diabetes Sister   . Colon cancer Brother 40  . Colon cancer Brother    History  Substance Use Topics  . Smoking status: Never Smoker   . Smokeless tobacco: Never Used  . Alcohol Use: No   OB History    No data available     Review of Systems  All other systems reviewed and are negative.     Allergies  Other  Home Medications   Prior to Admission medications   Medication Sig Start Date End Date Taking? Authorizing Provider  acetaminophen (TYLENOL) 500 MG tablet Take 1,000 mg by mouth every 6 (six) hours as needed for mild pain (pain).     Historical Provider, MD  albuterol (PROVENTIL HFA;VENTOLIN HFA) 108 (90 BASE) MCG/ACT inhaler Inhale 2 puffs into the lungs every 6 (six) hours as needed for wheezing or shortness of  breath (wheezing).    Historical Provider, MD  aspirin 325 MG tablet Take 650 mg by mouth 3 (three) times daily as needed for headache.    Historical Provider, MD  atenolol (TENORMIN) 100 MG tablet Take 100 mg by mouth daily.    Historical Provider, MD  beclomethasone (QVAR) 80 MCG/ACT inhaler Inhale 2 puffs into the lungs 2 (two) times daily.    Historical Provider, MD  busPIRone (BUSPAR) 10 MG tablet Take 10 mg by mouth 3 (three) times daily.    Historical Provider, MD  FLUoxetine (PROZAC) 20 MG capsule Take 3 capsules (60 mg total) by mouth daily. 05/26/14   Tyrone Nine, MD  fluticasone (FLOVENT HFA) 220 MCG/ACT inhaler Inhale 2 puffs into the lungs 2 (two) times daily.    Historical Provider, MD  furosemide (LASIX) 20 MG tablet TAKE 1 TABLET BY MOUTH EVERY DAY 09/07/14   Tyrone Nine, MD  gabapentin (NEURONTIN) 100 MG capsule Take 200 mg by mouth 3 (three) times daily.     Historical Provider, MD  guaiFENesin-codeine 100-10 MG/5ML syrup Take 5 mLs by mouth every 6 (six) hours as needed for cough. 09/11/14   Derwood Kaplan, MD  hydrochlorothiazide (HYDRODIURIL) 25 MG tablet Take 25 mg by mouth daily.    Historical Provider, MD  hydrOXYzine (ATARAX/VISTARIL) 10 MG tablet Take 10 mg by mouth at bedtime as needed for itching (itching).     Historical Provider, MD  lisinopril (PRINIVIL,ZESTRIL) 10 MG tablet Take 10 mg by mouth daily.    Historical Provider, MD  meclizine (ANTIVERT) 25 MG tablet Take 25 mg by mouth 3 (three) times daily as needed for dizziness (dizziness).     Historical Provider, MD  Misc Natural Products (ESTROVEN ENERGY PO) Take 1 tablet by mouth daily.    Historical Provider, MD  naproxen sodium (ANAPROX) 220 MG tablet Take 660 mg by mouth 2 (two) times daily.    Historical Provider, MD  omeprazole (PRILOSEC) 40 MG capsule Take 1 capsule (40 mg total) by mouth daily. 08/31/14   Arby Barrette, MD  risperiDONE (RISPERDAL) 0.5 MG tablet Take 0.5 mg by mouth at bedtime.    Historical Provider, MD  tacrolimus (PROTOPIC) 0.1 % ointment Apply 1 application topically 3 (three) times daily as needed (for legs). Apply to affected areas daily PRN 03/01/14 03/01/15  Historical Provider, MD  traMADol (ULTRAM) 50 MG tablet Take 1 tablet (50 mg total) by mouth every 6 (six) hours as needed for moderate pain or severe pain. 10/19/14   Trixie Dredge, PA-C  triamcinolone cream (KENALOG) 0.1 % Apply 1 application topically 2 (two) times daily as needed (for legs).     Historical Provider, MD  warfarin (COUMADIN) 5 MG tablet TAKE 1 TABLET BY MOUTH EVERY DAY 10/18/14   Levert Feinstein, MD   BP 147/67 mmHg  Pulse 56  Temp(Src) 98.2 F (36.8 C) (Oral)  Resp 20  SpO2 99%  LMP 11/22/1996 Physical Exam  Constitutional: She is oriented to person, place, and time. She appears well-developed and well-nourished.  HENT:  Head: Normocephalic and atraumatic.  Right Ear: External  ear normal.  Left Ear: External ear normal.  Eyes: Conjunctivae and EOM are normal. Pupils are equal, round, and reactive to light.  Neck: Normal range of motion and phonation normal. Neck supple.  Cardiovascular: Normal rate, regular rhythm and normal heart sounds.   Pulmonary/Chest: Effort normal and breath sounds normal. She exhibits no bony tenderness.  Abdominal: Soft. There is no tenderness.  Musculoskeletal:  Normal range of motion.  Normal range of motion, neck and back.  No palpable tenderness along the spine.  Neurological: She is alert and oriented to person, place, and time. No cranial nerve deficit or sensory deficit. She exhibits normal muscle tone. Coordination normal.  Skin: Skin is warm, dry and intact.  Psychiatric: She has a normal mood and affect. Her behavior is normal. Judgment and thought content normal.  Nursing note and vitals reviewed.   ED Course  Procedures (including critical care time)  Medications - No data to display  Patient Vitals for the past 24 hrs:  BP Temp Temp src Pulse Resp SpO2  10/25/14 1315 - - - (!) 56 - 99 %  10/25/14 1305 147/67 mmHg - - (!) 54 - 98 %  10/25/14 1230 130/63 mmHg - - (!) 51 - 97 %  10/25/14 1215 153/74 mmHg - - (!) 54 - 99 %  10/25/14 1200 152/63 mmHg - - (!) 51 - 99 %  10/25/14 1125 163/73 mmHg 98.2 F (36.8 C) Oral 63 20 95 %     Review of records from the [place]  Controlled substance reporting system; indicates that since August 2015.  She has had multiple prescriptions from emergency department and urgent care providers, but none from her doctor, for narcotic analgesic medicines.  The patient was informed that I would not give her narcotic analgesia in the ED.  At that point she decided to leave.  She was given instructions and discharge paperwork   Labs Review Labs Reviewed - No data to display  Imaging Review No results found.   EKG Interpretation None      MDM   Final diagnoses:  Chronic back  pain    Chronic back pain with frequent emergency department visits.  She has a doctor who can prescribe her pain medicine, but does not choose to follow-up there.  Doubt fracture, spinal myelopathy, significant nerve impingement.   Nursing Notes Reviewed/ Care Coordinated Applicable Imaging Reviewed Interpretation of Laboratory Data incorporated into ED treatment  The patient appears reasonably screened and/or stabilized for discharge and I doubt any other medical condition or other [facility] requiring further screening, evaluation, or treatment in the ED at this time prior to discharge.  Plan: Home Medications- usual; Home Treatments- rest; return here if the recommended treatment, does not improve the symptoms; Recommended follow up- PCP prn     Flint Melter, MD 10/25/14 1718

## 2014-10-25 NOTE — ED Notes (Signed)
Pt. Refusal to have vital signs checked. Pt. A x O x4, ambulatory. Pt. Pacing in halls.

## 2014-10-25 NOTE — ED Provider Notes (Signed)
CSN: [id number]     Arrival date & time 10/25/14  2050 History   First MD Initiated Contact with Patient 10/25/14 2215     Chief Complaint  Patient presents with  . Back Pain     (Consider location/radiation/quality/duration/timing/severity/associated sxs/prior Treatment) HPI Comments: Patient reports chronic low back pain.  It is no different today.  She states she took Tylenol at home without relief.  She was here earlier today for same complaint and left AMA.  She states she called her primary care doctor after ED visit and an appointment set up for Friday.  Patient is a 74 y.o. female presenting with back pain. The history is provided by the patient. No language interpreter was used.  Back Pain Location:  Lumbar spine Quality:  Aching Radiates to:  L posterior upper leg and R posterior upper leg Pain severity:  Severe Pain is:  Same all the time Onset quality:  Unable to specify Duration: YEars. Timing:  Constant Progression:  Waxing and waning Chronicity:  Chronic Relieved by: Tramadol. Worsened by:  Movement Ineffective treatments:  OTC medications Associated symptoms: no abdominal pain, no chest pain, no dysuria, no fever, no headaches, no numbness, no pelvic pain and no weakness   Associated symptoms comment:  Unchanged chronic paresthesias and weakness in bilateral legs Risk factors: obesity     Past Medical History  Diagnosis Date  . Depression   . Schizophrenia   . Hyperlipidemia   . Hypertension   . Chronic pain     "over my whole body" (03/30/2013)  . 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 DVT (deep venous thrombosis)     per 01/17/10 d/c summary- "remote hx of dvt"?  . Iron deficiency anemia   . Glaucoma   . Hemorrhoids   . Asthma   . Anxiety   . GERD (gastroesophageal reflux disease)   . Psoriasis 04/29/2012    New onset evaluated by Dr Marylou FlesherWilliam Huang, Dermat[facility], [facility] 6/13  Rx 0.1% Tacrolimus ointment  . Complication of  anesthesia     "because I have sleep apnea" (03/30/2013)  . Heart murmur   . Chest pain, exertional   . Chronic bronchitis   . Sleep apnea     "waiting on my CPAP" (03/30/2013)  . Pneumonia     "a few times" (03/30/2013)  . Exertional shortness of breath     "& sometimes when laying down" (03/30/2013)  . Daily headache     "last 2 months" (03/30/2013)  . Arthritis     "joints" (03/30/2013)  . Varicose veins of legs   . Psoriasis    Past Surgical History  Procedure Laterality Date  . Spinal fusion      2004  . Carpal tunnel release Bilateral   . Tubal ligation  1973  . Dilation and curettage of uterus  1970's    "once" (03/30/2013)  . 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 Joint replacement    . Vein surgery  left leg   Family History  Problem Relation Age of Onset  . Diabetes Sister   . Colon cancer Brother 40  . Colon cancer Brother    History  Substance Use Topics  . Smoking status: Never Smoker   . Smokeless tobacco: Never Used  . Alcohol Use: No   OB History    No data available     Review of Systems  Constitutional: Negative for fever, chills, diaphoresis, activity change, appetite change and  fatigue.  HENT: Negative for congestion, facial swelling, rhinorrhea and sore throat.   Eyes: Negative for photophobia and discharge.  Respiratory: Negative for cough, chest tightness and shortness of breath.   Cardiovascular: Negative for chest pain, palpitations and leg swelling.  Gastrointestinal: Negative for nausea, vomiting, abdominal pain and diarrhea.  Endocrine: Negative for polydipsia and polyuria.  Genitourinary: Negative for dysuria, frequency, difficulty urinating and pelvic pain.  Musculoskeletal: Positive for back pain. Negative for arthralgias, neck pain and neck stiffness.  Skin: Negative for color change and wound.  Allergic/Immunologic: Negative for immunocompromised state.  Neurological: Negative for facial asymmetry, weakness, numbness and headaches.    Hematological: Does not bruise/bleed easily.  Psychiatric/Behavioral: Negative for confusion and agitation.      Allergies  Other  Home Medications   Prior to Admission medications   Medication Sig Start Date End Date Taking? Authorizing Provider  acetaminophen (TYLENOL) 500 MG tablet Take 1,000 mg by mouth every 6 (six) hours as needed for mild pain (pain).     Historical Provider, MD  albuterol (PROVENTIL HFA;VENTOLIN HFA) 108 (90 BASE) MCG/ACT inhaler Inhale 2 puffs into the lungs every 6 (six) hours as needed for wheezing or shortness of breath (wheezing).    Historical Provider, MD  aspirin 325 MG tablet Take 650 mg by mouth 3 (three) times daily as needed for headache.    Historical Provider, MD  atenolol (TENORMIN) 100 MG tablet Take 100 mg by mouth daily.    Historical Provider, MD  beclomethasone (QVAR) 80 MCG/ACT inhaler Inhale 2 puffs into the lungs 2 (two) times daily.    Historical Provider, MD  busPIRone (BUSPAR) 10 MG tablet Take 10 mg by mouth 3 (three) times daily.    Historical Provider, MD  FLUoxetine (PROZAC) 20 MG capsule Take 3 capsules (60 mg total) by mouth daily. 05/26/14   Tyrone Nine, MD  fluticasone (FLOVENT HFA) 220 MCG/ACT inhaler Inhale 2 puffs into the lungs 2 (two) times daily.    Historical Provider, MD  furosemide (LASIX) 20 MG tablet TAKE 1 TABLET BY MOUTH EVERY DAY 09/07/14   Tyrone Nine, MD  gabapentin (NEURONTIN) 100 MG capsule Take 200 mg by mouth 3 (three) times daily.    Historical Provider, MD  guaiFENesin-codeine 100-10 MG/5ML syrup Take 5 mLs by mouth every 6 (six) hours as needed for cough. 09/11/14   Derwood Kaplan, MD  hydrochlorothiazide (HYDRODIURIL) 25 MG tablet Take 25 mg by mouth daily.    Historical Provider, MD  hydrOXYzine (ATARAX/VISTARIL) 10 MG tablet Take 10 mg by mouth at bedtime as needed for itching (itching).     Historical Provider, MD  lisinopril (PRINIVIL,ZESTRIL) 10 MG tablet Take 10 mg by mouth daily.    Historical  Provider, MD  meclizine (ANTIVERT) 25 MG tablet Take 25 mg by mouth 3 (three) times daily as needed for dizziness (dizziness).     Historical Provider, MD  Misc Natural Products (ESTROVEN ENERGY PO) Take 1 tablet by mouth daily.    Historical Provider, MD  naproxen sodium (ANAPROX) 220 MG tablet Take 660 mg by mouth 2 (two) times daily.    Historical Provider, MD  omeprazole (PRILOSEC) 40 MG capsule Take 1 capsule (40 mg total) by mouth daily. 08/31/14   Arby Barrette, MD  risperiDONE (RISPERDAL) 0.5 MG tablet Take 0.5 mg by mouth at bedtime.    Historical Provider, MD  tacrolimus (PROTOPIC) 0.1 % ointment Apply 1 application topically 3 (three) times daily as needed (for legs). Apply to affected  areas daily PRN 03/01/14 03/01/15  Historical Provider, MD  traMADol (ULTRAM) 50 MG tablet Take 1 tablet (50 mg total) by mouth every 6 (six) hours as needed for moderate pain or severe pain. 10/19/14   Trixie Dredge, PA-C  triamcinolone cream (KENALOG) 0.1 % Apply 1 application topically 2 (two) times daily as needed (for legs).     Historical Provider, MD  warfarin (COUMADIN) 5 MG tablet TAKE 1 TABLET BY MOUTH EVERY DAY 10/18/14   Levert Feinstein, MD   BP 134/71 mmHg  Pulse 55  Temp(Src) 99.2 F (37.3 C) (Oral)  SpO2 99%  LMP 11/22/1996 Physical Exam  Constitutional: She is oriented to person, place, and time. She appears well-developed and well-nourished. No distress.  HENT:  Head: Normocephalic and atraumatic.  Mouth/Throat: No oropharyngeal exudate.  Eyes: Pupils are equal, round, and reactive to light.  Neck: Normal range of motion. Neck supple.  Cardiovascular: Normal rate, regular rhythm and normal heart sounds.  Exam reveals no gallop and no friction rub.   No murmur heard. Pulmonary/Chest: Effort normal and breath sounds normal. No respiratory distress. She has no wheezes. She has no rales.  Abdominal: Soft. Bowel sounds are normal. She exhibits no distension and no mass. There is no  tenderness. There is no rebound and no guarding.  Musculoskeletal: Normal range of motion. She exhibits no edema or tenderness.       Back:  Neurological: She is alert and oriented to person, place, and time. She displays no tremor. A sensory deficit is present. No cranial nerve deficit. She exhibits normal muscle tone. She displays a negative Romberg sign. Coordination and gait normal. GCS eye subscore is 4. GCS verbal subscore is 5. GCS motor subscore is 6.  Reports chronic, unchanged bilateral lower extremity weakness and numbness  Skin: Skin is warm and dry.  Psychiatric: She has a normal mood and affect.    ED Course  Procedures (including critical care time) Labs Review Labs Reviewed - No data to display  Imaging Review No results found.   EKG Interpretation None      MDM   Final diagnoses:  Chronic low back pain    Pt is a 74 y.o. female with Pmhx as above who presents with chronic low back pain.  She states that the symptoms are not any different currently.  She was here earlier today with same symptoms.  She denies any change in the location of the pain.  She states she has had chronic unchanged numbness and tingling of her bilateral legs.  On physical exam she is mildly bradycardic.  She is in no acute distress.  She denies any infectious signs or symptoms.  She states that after she left AMA earlier today for the same complaint.  She called her PCPs office and has appointment scheduled for Friday.  She also has an upcoming appointment with a spine surgeon.  In reviewing her narcotics database.  She has multiple scripts mainly for tramadol from many different ED providers.  I discussed with her that the ED is not appropriate location for treatment of chronic pain.  She states she does not want a prescription for pain medication.  She is interested in going to a new pain clinic in [place].  She will be given one dose treatment.  On department.  We'll discharge.  I doubt cord  compression, cauda equina or subdural abscess.    [name] evaluation in the Emergency Department is complete. It has been determined that  no acute conditions requiring further emergency intervention are present at this time. The patient/guardian have been advised of the diagnosis and plan. We have discussed signs and symptoms that warrant return to the ED, such as changes or worsening in symptoms, worsening pain, fever, worsening numbness, weakness.      Toy Cookey, MD 10/25/14 2252

## 2014-10-25 NOTE — ED Notes (Addendum)
Pt in via EMS to triage, c/o lower back pain for the last three weeks, seen for same earlier today and left AMA, here for further pain control, no distress noted, VSS during transport

## 2014-10-25 NOTE — Discharge Instructions (Signed)

## 2014-10-25 NOTE — ED Notes (Signed)
Pt. Requesting to leave AMA. Dr. Effie ShyWentz notified and agreed to d/c pt. Pt. Refusing to sign for paperwork or accept d/c paperwork at this time. Pt. Axo x4 at d/c. Pt. Ambulatory.

## 2014-10-29 ENCOUNTER — Ambulatory Visit: Payer: Medicare Other

## 2014-10-29 ENCOUNTER — Ambulatory Visit (INDEPENDENT_AMBULATORY_CARE_PROVIDER_SITE_OTHER): Payer: Medicare Other | Admitting: Family Medicine

## 2014-10-29 ENCOUNTER — Encounter: Payer: Self-pay | Admitting: Pharmacist

## 2014-10-29 ENCOUNTER — Other Ambulatory Visit: Payer: Medicare Other

## 2014-10-29 ENCOUNTER — Other Ambulatory Visit: Payer: Self-pay | Admitting: Oncology

## 2014-10-29 ENCOUNTER — Encounter: Payer: Self-pay | Admitting: Family Medicine

## 2014-10-29 VITALS — BP 134/77 | HR 72 | Temp 98.9°F | Ht 65.0 in | Wt 234.2 lb

## 2014-10-29 DIAGNOSIS — Z86711 Personal history of pulmonary embolism: Secondary | ICD-10-CM | POA: Diagnosis not present

## 2014-10-29 DIAGNOSIS — E669 Obesity, unspecified: Secondary | ICD-10-CM

## 2014-10-29 DIAGNOSIS — E785 Hyperlipidemia, unspecified: Secondary | ICD-10-CM

## 2014-10-29 DIAGNOSIS — Z23 Encounter for immunization: Secondary | ICD-10-CM

## 2014-10-29 DIAGNOSIS — F329 Major depressive disorder, single episode, unspecified: Secondary | ICD-10-CM

## 2014-10-29 DIAGNOSIS — M1712 Unilateral primary osteoarthritis, left knee: Secondary | ICD-10-CM

## 2014-10-29 DIAGNOSIS — D6851 Activated protein C resistance: Secondary | ICD-10-CM

## 2014-10-29 DIAGNOSIS — F209 Schizophrenia, unspecified: Secondary | ICD-10-CM

## 2014-10-29 DIAGNOSIS — R911 Solitary pulmonary nodule: Secondary | ICD-10-CM

## 2014-10-29 DIAGNOSIS — I10 Essential (primary) hypertension: Secondary | ICD-10-CM

## 2014-10-29 DIAGNOSIS — D688 Other specified coagulation defects: Secondary | ICD-10-CM

## 2014-10-29 DIAGNOSIS — L409 Psoriasis, unspecified: Secondary | ICD-10-CM

## 2014-10-29 DIAGNOSIS — F32A Depression, unspecified: Secondary | ICD-10-CM

## 2014-10-29 DIAGNOSIS — Z7901 Long term (current) use of anticoagulants: Secondary | ICD-10-CM

## 2014-10-29 DIAGNOSIS — F333 Major depressive disorder, recurrent, severe with psychotic symptoms: Secondary | ICD-10-CM

## 2014-10-29 MED ORDER — TRIAMCINOLONE ACETONIDE 0.1 % EX CREA: 1.0000 "application " | TOPICAL_CREAM | Freq: Two times a day (BID) | CUTANEOUS | Status: DC | PRN

## 2014-10-29 MED ORDER — FUROSEMIDE 20 MG PO TABS: 20.0000 mg | ORAL_TABLET | Freq: Every day | ORAL | Status: DC

## 2014-10-29 MED ORDER — METHYLPREDNISOLONE ACETATE 80 MG/ML IJ SUSP
80.0000 mg | Freq: Once | INTRAMUSCULAR | Status: AC
  Administered 2014-10-29: 80 mg via INTRA_ARTICULAR

## 2014-10-29 MED ORDER — HYDROXYZINE HCL 10 MG PO TABS: 10.0000 mg | ORAL_TABLET | Freq: Every evening | ORAL | Status: DC | PRN

## 2014-10-29 NOTE — Assessment & Plan Note (Signed)
At goal today. No labwork indicated.

## 2014-10-29 NOTE — Assessment & Plan Note (Signed)
Inpatient therapy 09/28/2014-10/05/2014 [facility] [place]. DRAMATICALLY improved mood and behavior since then. [facility]. Improvement in this has already spelled out improvements in her healthcare as she is taking her chronic conditions seriously without fixation on narcotic pain medications.

## 2014-10-29 NOTE — Progress Notes (Signed)
Pt did not come to [facility] today. She has appt 2/16 at 10:15 am w/ Dr. Cyndie ChimeGranfortuna.  I will ask them to check PT/INR when she is there & we can call pt w/ results. Ebony HailGinna Red Mandt, Pharm.D., CPP 10/29/2014@11 :11 AM

## 2014-10-29 NOTE — Assessment & Plan Note (Signed)
01/17/10 CT: questionable tiny nodular density RUL 11/16/10 Repeat ZO:XWRUECT:Prior 4 mm RUL not visualized, and may have been infectious/inflammatory.

## 2014-10-29 NOTE — Assessment & Plan Note (Signed)
Severe. Pt to continue adjunctive therapy including heat/ice, regular exercises as tolerated. Steroid injection given today. Would like to avoid replacement for as long as possible.
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
## 2014-10-29 NOTE — Progress Notes (Signed)
Patient ID: [name] SnowballDora C [name], female   DOB: 11/26/1940, 74 y.o.   MRN: [id number] Subjective: [name] [name] is a 74 y.o. female with a history of chronic pain, psoriasis, and severe recurrent depressive disorder here for medication refill.  She has recently received intensive inpatient therapy 1/05-1/08/2015 for "major depressive disorder, recently severe with psychosis." at [facility] [place]. She was discharged on the following: Risperdal 0.5mg  qHS Buspar 10mg  TID Prozac 60mg  qAM Neurontin 200mg  TID  Ultram 50mg  q6h prn pain Vit D 2000U daily  She reports severe left knee and lower back pain - both chronic. Requests pain management for these, but does recall being told that this clinic will no longer prescribe any opioid analgesics. She can't take NSAIDs chronically due to coumadin use. 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 Pain is severe, constant. Knee swells at times. She would like a steroid injection of her knee today.   - Nonsmoker - Review of Systems: Per HPI. All other systems reviewed and are negative. - Past Medical History: Patient Active Problem List   Diagnosis Date Noted  . Back pain 06/09/2013  . Vertigo 05/31/2012  . Psoriasis 04/29/2012  . Chronic anticoagulation 12/11/2011  . Heterozygous factor V Leiden mutation 01/18/2011  . Anemia, normocytic normochromic 01/18/2011  . GERD 12/21/2010  . Family history of malignant neoplasm of gastrointestinal tract 12/21/2010  . Iron deficiency anemia, unspecified 12/21/2010  . Varicose veins of both lower extremities with pain 12/15/2010  . Encounter for long-term (current) use of anticoagulants 11/30/2010  . History of pulmonary embolism 01/30/2010  . Dyslipidemia 01/10/2010  . 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 Obesity 09/26/2009  . Schizophrenia, unspecified type 09/26/2009  . Depression 09/26/2009  . Chronic pain syndrome 09/26/2009  .  Essential hypertension, benign 09/26/2009   - Medications: reviewed and updated Current Outpatient Prescriptions  Medication Sig Dispense Refill  . acetaminophen (TYLENOL) 500 MG tablet Take 1,000 mg by mouth every 6 (six) hours as needed for mild pain (pain).     Marland Kitchen. albuterol (PROVENTIL HFA;VENTOLIN HFA) 108 (90 BASE) MCG/ACT inhaler Inhale 2 puffs into the lungs every 6 (six) hours as needed for wheezing or shortness of breath (wheezing).    Marland Kitchen. aspirin 325 MG tablet Take 650 mg by mouth 3 (three) times daily as needed for headache.    Marland Kitchen. atenolol (TENORMIN) 100 MG tablet Take 100 mg by mouth daily.    . beclomethasone (QVAR) 80 MCG/ACT inhaler Inhale 2 puffs into the lungs 2 (two) times daily.    . busPIRone (BUSPAR) 10 MG tablet Take 10 mg by mouth 3 (three) times daily.    Marland Kitchen. FLUoxetine (PROZAC) 20 MG capsule Take 3 capsules (60 mg total) by mouth daily. 90 capsule 11  . fluticasone (FLOVENT HFA) 220 MCG/ACT inhaler Inhale 2 puffs into the lungs 2 (two) times daily.    . furosemide (LASIX) 20 MG tablet TAKE 1 TABLET BY MOUTH EVERY DAY 30 tablet 0  . gabapentin (NEURONTIN) 100 MG capsule Take 200 mg by mouth 3 (three) times daily.    Marland Kitchen. guaiFENesin-codeine 100-10 MG/5ML syrup Take 5 mLs by mouth every 6 (six) hours as needed for cough. 120 mL 0  . hydrochlorothiazide (HYDRODIURIL) 25 MG tablet Take 25 mg by mouth daily.    . hydrOXYzine (ATARAX/VISTARIL) 10 MG tablet Take 10 mg by mouth at bedtime as needed for itching (itching).     Marland Kitchen. lisinopril (PRINIVIL,ZESTRIL)  10 MG tablet Take 10 mg by mouth daily.    . meclizine (ANTIVERT) 25 MG tablet Take 25 mg by mouth 3 (three) times daily as needed for dizziness (dizziness).     . Misc Natural Products (ESTROVEN ENERGY PO) Take 1 tablet by mouth daily.    . naproxen sodium (ANAPROX) 220 MG tablet Take 660 mg by mouth 2 (two) times daily.    Marland Kitchen omeprazole (PRILOSEC) 40 MG capsule Take 1 capsule (40 mg total) by mouth daily. 30 capsule 0  . risperiDONE  (RISPERDAL) 0.5 MG tablet Take 0.5 mg by mouth at bedtime.    . tacrolimus (PROTOPIC) 0.1 % ointment Apply 1 application topically 3 (three) times daily as needed (for legs). Apply to affected areas daily PRN    . traMADol (ULTRAM) 50 MG tablet Take 1 tablet (50 mg total) by mouth every 6 (six) hours as needed for moderate pain or severe pain. 10 tablet 0  . triamcinolone cream (KENALOG) 0.1 % Apply 1 application topically 2 (two) times daily as needed (for legs).     . warfarin (COUMADIN) 5 MG tablet TAKE 1 TABLET BY MOUTH EVERY DAY 30 tablet 4   No current facility-administered medications for this visit.    Objective: BP 134/77 mmHg  Pulse 72  Temp(Src) 98.9 F (37.2 C) (Oral)  Ht  (1.651 m)  Wt 234 lb 3.2 oz (106.232 kg)  BMI 38.97 kg/m2  LMP 11/22/1996 Gen: Pleasant 74 y.o. female in no distress CV: Regular rate, no murmur, no JVD, DP and PT pulses 2+ bilaterally with severe widespread varicosities on bilateral lower extremities up to thighs. Negative homan's.  Resp: Non-labored, CTAB, no wheezes noted MSK: No edema noted. R knee with linear scar s/p replacement. Left knee with crepitus and moderate effusion without ecchymosis or erythema. + crepitus. No instability.  Psych: Neatly groomed and well dressed. Maintains good eye contact and is cooperative and attentive. Speech is normal volume and rate. Mood is euthymic with a congruent affect. Thought process is logical and goal directed. No suicidal or homicidal ideation. Does not appear to be responding to any internal stimuli. Able to maintain train of thought and concentrate on the questions.  LEFT KNEE INJECTION: Patient was given informed consent, signed copy in the chart. Appropriate time out was taken. Area prepped and draped in usual sterile fashion. 1 cc of methylprednisolone 80 mg/ml plus 4 cc of 1% lidocaine without epinephrine was injected into the left knee joint using an infero-lateral approach approach. The patient  tolerated the procedure well. There were no complications. Post procedure instructions were given.  Assessment/Plan: [name] [name] is a 74 y.o. female here for knee pain and medication refill.  Discussed preventive healthcare. Has not had colonoscopy, mammography, nor pap smear in 20 years. She statesWould like not to have her daughter contacted, instead would like her sister, Carlyle Basques contacted [phone] she is getting things together now and would like to postpone discussions of these important screening tests to our next office visit.  - Needs colonoscopy, mammography, pap smear, DEXA scan. Received flu and pneumonia vaccinations today.   Would like not to have her daughter contacted, instead would like her sister, Carlyle Basques contacted [phone]

## 2014-10-29 NOTE — Patient Instructions (Addendum)
Thank you for coming in today!  I can tell you have done some really strong work on yourself. You seem happier and more confident now than you've ever seemed to me.   We injected your knee today. You can take the bandaid off tomorrow. Check with your arthritis doctor to get a prescription for pain relief.   I have refilled the medications we discussed. They have been sent to your pharmacy.   As you leave, make an appointment to follow up with me in 1-2 months to discuss preventive healthcare.  - Bring all medications in a bag to your visits.  Take care and seek immediate care sooner if you develop any concerns.  Please feel free to call with any questions or concerns at any time, at [phone]. - Dr. Jarvis NewcomerGrunz

## 2014-11-03 ENCOUNTER — Encounter: Payer: Self-pay | Admitting: Cardiology

## 2014-11-03 ENCOUNTER — Encounter (HOSPITAL_COMMUNITY): Payer: Self-pay | Admitting: Emergency Medicine

## 2014-11-03 ENCOUNTER — Institutional Professional Consult (permissible substitution): Payer: Medicare Other | Admitting: Cardiology

## 2014-11-03 ENCOUNTER — Emergency Department (INDEPENDENT_AMBULATORY_CARE_PROVIDER_SITE_OTHER)
Admission: EM | Admit: 2014-11-03 | Discharge: 2014-11-03 | Disposition: A | Discharge status: Home / Self Care | Payer: Medicare Other | Source: Home / Self Care | Attending: Family Medicine | Admitting: Family Medicine

## 2014-11-03 DIAGNOSIS — M549 Dorsalgia, unspecified: Secondary | ICD-10-CM

## 2014-11-03 DIAGNOSIS — G8929 Other chronic pain: Secondary | ICD-10-CM

## 2014-11-03 NOTE — ED Provider Notes (Signed)
[name] [name] is a 74 y.o. female who presents to Urgent Care today for chronic pain medication. Patient has chronic back pain. She's been out of her tramadol for 2 or 3 months and would like a refill today. She has a primary care provider who will not prescribe her tramadol. She is currently being referred to pain management. Her pain has not Changed in any way. No weakness or numbness bowel bladder dysfunction.   Past Medical History  Diagnosis Date  . Depression   . Schizophrenia   . Hyperlipidemia   . Hypertension   . Chronic pain     "over my whole body" (03/30/2013)  . 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 DVT (deep venous thrombosis)     per 01/17/10 d/c summary- "remote hx of dvt"?  . Iron deficiency anemia   . Glaucoma   . Hemorrhoids   . Asthma   . Anxiety   . GERD (gastroesophageal reflux disease)   . Psoriasis 04/29/2012    New onset evaluated by Dr Marylou Flesher, Dermatology, [facility] 6/13  Rx 0.1% Tacrolimus ointment  . Complication of anesthesia     "because I have sleep apnea" (03/30/2013)  . Heart murmur   . Chest pain, exertional   . Chronic bronchitis   . Sleep apnea     "waiting on my CPAP" (03/30/2013)  . Pneumonia     "a few times" (03/30/2013)  . Exertional shortness of breath     "& sometimes when laying down" (03/30/2013)  . Daily headache     "last 2 months" (03/30/2013)  . Arthritis     "joints" (03/30/2013)  . Varicose veins of legs   . Psoriasis    Past Surgical History  Procedure Laterality Date  . Spinal fusion      2004  . Carpal tunnel release Bilateral   . Tubal ligation  1973  . Dilation and curettage of uterus  1970's    "once" (03/30/2013)  . 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 Joint replacement    . Vein surgery  left leg   History  Substance Use Topics  . Smoking status: Never Smoker   . Smokeless tobacco: Never Used  . Alcohol Use: No   ROS as above Medications: No current facility-administered  medications for this encounter.   Current Outpatient Prescriptions  Medication Sig Dispense Refill  . acetaminophen (TYLENOL) 500 MG tablet Take 1,000 mg by mouth every 6 (six) hours as needed for mild pain (pain).     Marland Kitchen albuterol (PROVENTIL HFA;VENTOLIN HFA) 108 (90 BASE) MCG/ACT inhaler Inhale 2 puffs into the lungs every 6 (six) hours as needed for wheezing or shortness of breath (wheezing).    Marland Kitchen aspirin 325 MG tablet Take 650 mg by mouth 3 (three) times daily as needed for headache.    Marland Kitchen atenolol (TENORMIN) 100 MG tablet Take 100 mg by mouth daily.    . beclomethasone (QVAR) 80 MCG/ACT inhaler Inhale 2 puffs into the lungs 2 (two) times daily.    . busPIRone (BUSPAR) 10 MG tablet Take 10 mg by mouth 3 (three) times daily.    Marland Kitchen FLUoxetine (PROZAC) 20 MG capsule Take 3 capsules (60 mg total) by mouth daily. 90 capsule 11  . fluticasone (FLOVENT HFA) 220 MCG/ACT inhaler Inhale 2 puffs into the lungs 2 (two) times daily.    . furosemide (LASIX) 20 MG tablet Take 1 tablet (20 mg total) by mouth daily. 30 tablet  0  . gabapentin (NEURONTIN) 100 MG capsule Take 200 mg by mouth 3 (three) times daily.    . hydrochlorothiazide (HYDRODIURIL) 25 MG tablet Take 25 mg by mouth daily.    . hydrOXYzine (ATARAX/VISTARIL) 10 MG tablet Take 1 tablet (10 mg total) by mouth at bedtime as needed for itching (itching). 90 tablet 3  . lisinopril (PRINIVIL,ZESTRIL) 10 MG tablet Take 10 mg by mouth daily.    . meclizine (ANTIVERT) 25 MG tablet Take 25 mg by mouth 3 (three) times daily as needed for dizziness (dizziness).     . Misc Natural Products (ESTROVEN ENERGY PO) Take 1 tablet by mouth daily.    Marland Kitchen. omeprazole (PRILOSEC) 40 MG capsule Take 1 capsule (40 mg total) by mouth daily. 30 capsule 0  . risperiDONE (RISPERDAL) 0.5 MG tablet Take 0.5 mg by mouth at bedtime.    . tacrolimus (PROTOPIC) 0.1 % ointment Apply 1 application topically 3 (three) times daily as needed (for legs). Apply to affected areas daily PRN     . traMADol (ULTRAM) 50 MG tablet Take 1 tablet (50 mg total) by mouth every 6 (six) hours as needed for moderate pain or severe pain. 10 tablet 0  . triamcinolone cream (KENALOG) 0.1 % Apply 1 application topically 2 (two) times daily as needed (for legs). 80 g 3  . warfarin (COUMADIN) 5 MG tablet TAKE 1 TABLET BY MOUTH EVERY DAY 30 tablet 4   Allergies  Allergen Reactions  . Other     Pt states she allergic to a lot of generic medicines. Could not give me any specific names or reactions     Exam:  BP 166/78 mmHg  Pulse 60  Temp(Src) 98.6 F (37 C) (Oral)  SpO2 94%  LMP 11/22/1996 Gen: Well NAD Back: Nontender to spinal midline tender palpation bilateral lumbar paraspinals. Limited back range of motion due to pain. Lower extremity reflexes and strength are equal and normal bilaterally. Patient is able to stand from a seated position and climb on top of the exam table without any difficulty.  No results found. However, due to the size of the patient record, not all encounters were searched. Please check Results Review for a complete set of results. No results found.  Assessment and Plan: 74 y.o. female with chronic back pain. Unfortunately we are not able to prescribe chronic pain medicine at this location. Recommend patient follow-up with pain management. Recommend Tylenol as needed for pain control. Follow-up with PCP.  Discussed warning signs or symptoms. Please see discharge instructions. Patient expresses understanding.     Rodolph BongEvan S Darinda [place], MD 11/03/14 [phone]

## 2014-11-03 NOTE — ED Notes (Signed)
Pt here to get a refill on her Tramadol until her next appointment with her arthritis doctor.

## 2014-11-03 NOTE — Discharge Instructions (Signed)
Thank you for coming in today. Return as needed Follow-up with pain management Come back or go to the emergency room if you notice new weakness new numbness problems walking or bowel or bladder problems. Take up to 1000 mg of Tylenol every 6 hours for pain as needed  Chronic Back Pain  When back pain lasts longer than 3 months, it is called chronic back pain.People with chronic back pain often go through certain periods that are more intense (flare-ups).  CAUSES Chronic back pain can be caused by wear and tear (degeneration) on different structures in your back. These structures include:  The bones of your spine (vertebrae) and the joints surrounding your spinal cord and nerve roots (facets).  The strong, fibrous tissues that connect your vertebrae (ligaments). Degeneration of these structures may result in pressure on your nerves. This can lead to constant pain. HOME CARE INSTRUCTIONS  Avoid bending, heavy lifting, prolonged sitting, and activities which make the problem worse.  Take brief periods of rest throughout the day to reduce your pain. Lying down or standing usually is better than sitting while you are resting.  Take over-the-counter or prescription medicines only as directed by your caregiver. SEEK IMMEDIATE MEDICAL CARE IF:   You have weakness or numbness in one of your legs or feet.  You have trouble controlling your bladder or bowels.  You have nausea, vomiting, abdominal pain, shortness of breath, or fainting. Document Released: 10/18/2004 Document Revised: 12/03/2011 Document Reviewed: 08/25/2011 [facility] Patient Information 2015 [employer], [place]. This information is not intended to replace advice given to you by your health care provider. Make sure you discuss any questions you have with your health care provider.

## 2014-11-09 ENCOUNTER — Ambulatory Visit: Payer: PRIVATE HEALTH INSURANCE | Admitting: Oncology

## 2014-11-09 ENCOUNTER — Telehealth: Payer: Self-pay | Admitting: Pharmacist

## 2014-11-09 NOTE — Progress Notes (Signed)
Pt did not come to her appt  w/Dr Cyndie ChimeGranfortuna this morning.

## 2014-11-09 NOTE — Telephone Encounter (Signed)
LVM on home number to call and r/s appmt with dr. Reece AgarG and let us know when it is so we can appropriately schedule her next INR May need to see her if he can not fit her in sooner vs later

## 2014-11-18 ENCOUNTER — Encounter (HOSPITAL_COMMUNITY): Payer: Self-pay | Admitting: Emergency Medicine

## 2014-11-18 ENCOUNTER — Emergency Department (HOSPITAL_COMMUNITY)
Admission: EM | Admit: 2014-11-18 | Discharge: 2014-11-18 | Disposition: A | Discharge status: Home / Self Care | Payer: Medicare Other | Attending: Emergency Medicine | Admitting: Emergency Medicine

## 2014-11-18 DIAGNOSIS — Z872 Personal history of diseases of the skin and subcutaneous tissue: Secondary | ICD-10-CM | POA: Diagnosis not present

## 2014-11-18 DIAGNOSIS — Z86711 Personal history of pulmonary embolism: Secondary | ICD-10-CM | POA: Diagnosis not present

## 2014-11-18 DIAGNOSIS — F419 Anxiety disorder, unspecified: Secondary | ICD-10-CM | POA: Insufficient documentation

## 2014-11-18 DIAGNOSIS — M1990 Unspecified osteoarthritis, unspecified site: Secondary | ICD-10-CM | POA: Insufficient documentation

## 2014-11-18 DIAGNOSIS — M25571 Pain in right ankle and joints of right foot: Secondary | ICD-10-CM | POA: Diagnosis not present

## 2014-11-18 DIAGNOSIS — Z8639 Personal history of other endocrine, nutritional and metabolic disease: Secondary | ICD-10-CM | POA: Insufficient documentation

## 2014-11-18 DIAGNOSIS — I10 Essential (primary) hypertension: Secondary | ICD-10-CM | POA: Insufficient documentation

## 2014-11-18 DIAGNOSIS — Z7901 Long term (current) use of anticoagulants: Secondary | ICD-10-CM | POA: Insufficient documentation

## 2014-11-18 DIAGNOSIS — J45909 Unspecified asthma, uncomplicated: Secondary | ICD-10-CM | POA: Diagnosis not present

## 2014-11-18 DIAGNOSIS — M25561 Pain in right knee: Secondary | ICD-10-CM | POA: Diagnosis not present

## 2014-11-18 DIAGNOSIS — F329 Major depressive disorder, single episode, unspecified: Secondary | ICD-10-CM | POA: Insufficient documentation

## 2014-11-18 DIAGNOSIS — Z7982 Long term (current) use of aspirin: Secondary | ICD-10-CM | POA: Diagnosis not present

## 2014-11-18 DIAGNOSIS — G8929 Other chronic pain: Secondary | ICD-10-CM | POA: Diagnosis not present

## 2014-11-18 DIAGNOSIS — R011 Cardiac murmur, unspecified: Secondary | ICD-10-CM | POA: Diagnosis not present

## 2014-11-18 DIAGNOSIS — Z79899 Other long term (current) drug therapy: Secondary | ICD-10-CM | POA: Diagnosis not present

## 2014-11-18 DIAGNOSIS — Z7951 Long term (current) use of inhaled steroids: Secondary | ICD-10-CM | POA: Diagnosis not present

## 2014-11-18 DIAGNOSIS — Z8701 Personal history of pneumonia (recurrent): Secondary | ICD-10-CM | POA: Insufficient documentation

## 2014-11-18 DIAGNOSIS — K219 Gastro-esophageal reflux disease without esophagitis: Secondary | ICD-10-CM | POA: Diagnosis not present

## 2014-11-18 DIAGNOSIS — M545 Low back pain: Secondary | ICD-10-CM | POA: Diagnosis not present

## 2014-11-18 DIAGNOSIS — Z86718 Personal history of other venous thrombosis and embolism: Secondary | ICD-10-CM | POA: Diagnosis not present

## 2014-11-18 DIAGNOSIS — Z862 Personal history of diseases of the blood and blood-forming organs and certain disorders involving the immune mechanism: Secondary | ICD-10-CM | POA: Diagnosis not present

## 2014-11-18 DIAGNOSIS — R52 Pain, unspecified: Secondary | ICD-10-CM | POA: Diagnosis present

## 2014-11-18 MED ORDER — HYDROMORPHONE HCL 2 MG/ML IJ SOLN
2.0000 mg | Freq: Once | INTRAMUSCULAR | Status: AC
Start: 2014-11-18 — End: 2014-11-18
  Administered 2014-11-18: 2 mg via INTRAMUSCULAR
  Filled 2014-11-18: qty 1

## 2014-11-18 MED ORDER — TRAMADOL HCL 50 MG PO TABS: 50.0000 mg | ORAL_TABLET | Freq: Four times a day (QID) | ORAL | Status: DC | PRN
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
## 2014-11-21 ENCOUNTER — Encounter (HOSPITAL_COMMUNITY): Payer: Self-pay | Admitting: Emergency Medicine

## 2014-11-21 ENCOUNTER — Emergency Department (HOSPITAL_COMMUNITY)
Admission: EM | Admit: 2014-11-21 | Discharge: 2014-11-22 | Disposition: A | Discharge status: Home / Self Care | Payer: Medicare Other | Attending: Emergency Medicine | Admitting: Emergency Medicine

## 2014-11-21 DIAGNOSIS — G8929 Other chronic pain: Secondary | ICD-10-CM | POA: Diagnosis not present

## 2014-11-21 DIAGNOSIS — Z872 Personal history of diseases of the skin and subcutaneous tissue: Secondary | ICD-10-CM | POA: Diagnosis not present

## 2014-11-21 DIAGNOSIS — M62838 Other muscle spasm: Secondary | ICD-10-CM

## 2014-11-21 DIAGNOSIS — F419 Anxiety disorder, unspecified: Secondary | ICD-10-CM | POA: Diagnosis not present

## 2014-11-21 DIAGNOSIS — I10 Essential (primary) hypertension: Secondary | ICD-10-CM | POA: Diagnosis not present

## 2014-11-21 DIAGNOSIS — R011 Cardiac murmur, unspecified: Secondary | ICD-10-CM | POA: Insufficient documentation

## 2014-11-21 DIAGNOSIS — F329 Major depressive disorder, single episode, unspecified: Secondary | ICD-10-CM | POA: Insufficient documentation

## 2014-11-21 DIAGNOSIS — F209 Schizophrenia, unspecified: Secondary | ICD-10-CM | POA: Diagnosis not present

## 2014-11-21 DIAGNOSIS — Z79899 Other long term (current) drug therapy: Secondary | ICD-10-CM | POA: Insufficient documentation

## 2014-11-21 DIAGNOSIS — Z8669 Personal history of other diseases of the nervous system and sense organs: Secondary | ICD-10-CM | POA: Diagnosis not present

## 2014-11-21 DIAGNOSIS — Z86718 Personal history of other venous thrombosis and embolism: Secondary | ICD-10-CM | POA: Diagnosis not present

## 2014-11-21 DIAGNOSIS — J45909 Unspecified asthma, uncomplicated: Secondary | ICD-10-CM | POA: Insufficient documentation

## 2014-11-21 DIAGNOSIS — Z86711 Personal history of pulmonary embolism: Secondary | ICD-10-CM | POA: Diagnosis not present

## 2014-11-21 DIAGNOSIS — Z862 Personal history of diseases of the blood and blood-forming organs and certain disorders involving the immune mechanism: Secondary | ICD-10-CM | POA: Insufficient documentation

## 2014-11-21 DIAGNOSIS — M2550 Pain in unspecified joint: Secondary | ICD-10-CM | POA: Diagnosis not present

## 2014-11-21 DIAGNOSIS — M1990 Unspecified osteoarthritis, unspecified site: Secondary | ICD-10-CM | POA: Diagnosis not present

## 2014-11-21 DIAGNOSIS — M542 Cervicalgia: Secondary | ICD-10-CM | POA: Diagnosis present

## 2014-11-21 DIAGNOSIS — Z7982 Long term (current) use of aspirin: Secondary | ICD-10-CM | POA: Insufficient documentation

## 2014-11-21 DIAGNOSIS — Z7901 Long term (current) use of anticoagulants: Secondary | ICD-10-CM | POA: Diagnosis not present

## 2014-11-21 DIAGNOSIS — K219 Gastro-esophageal reflux disease without esophagitis: Secondary | ICD-10-CM | POA: Insufficient documentation

## 2014-11-21 DIAGNOSIS — Z8701 Personal history of pneumonia (recurrent): Secondary | ICD-10-CM | POA: Diagnosis not present
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
## 2014-11-22 MED ORDER — KETOROLAC TROMETHAMINE 30 MG/ML IJ SOLN
30.0000 mg | Freq: Once | INTRAMUSCULAR | Status: AC
  Administered 2014-11-22: 30 mg via INTRAMUSCULAR
  Filled 2014-11-22: qty 1

## 2014-11-22 MED ORDER — ACETAMINOPHEN 500 MG PO TABS: 1000.0000 mg | ORAL_TABLET | Freq: Three times a day (TID) | ORAL | Status: DC | PRN
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
## 2014-11-22 NOTE — Discharge Instructions (Signed)

## 2014-11-26 ENCOUNTER — Telehealth: Payer: Self-pay | Admitting: Pharmacist

## 2014-11-26 NOTE — Telephone Encounter (Signed)
I s/w pt over phone. She has a new temporary home but it is not handicapped accessible for her son.  They're still trying to get something better for him. She will come 12/01/14 at 10 am for lab/CC. Ebony HailGinna Al Gagen, Pharm.D., CPP 11/26/2014@11 :44 AM

## 2014-12-01 ENCOUNTER — Other Ambulatory Visit: Payer: Medicare Other

## 2014-12-01 ENCOUNTER — Ambulatory Visit: Payer: Medicare Other

## 2014-12-06 ENCOUNTER — Encounter (HOSPITAL_COMMUNITY): Payer: Self-pay

## 2014-12-06 ENCOUNTER — Emergency Department (INDEPENDENT_AMBULATORY_CARE_PROVIDER_SITE_OTHER)
Admission: EM | Admit: 2014-12-06 | Discharge: 2014-12-06 | Disposition: A | Discharge status: Home / Self Care | Payer: Medicare Other | Source: Home / Self Care | Attending: Family Medicine | Admitting: Family Medicine

## 2014-12-06 DIAGNOSIS — G8929 Other chronic pain: Secondary | ICD-10-CM

## 2014-12-06 MED ORDER — TRAMADOL HCL 50 MG PO TABS: 50.0000 mg | ORAL_TABLET | Freq: Four times a day (QID) | ORAL | Status: DC | PRN

## 2014-12-06 NOTE — ED Notes (Signed)
C/o widespread pain for many months. Has documentation of her placement in emergency housing by [employer] [facility] [employer] . Requesting Rx for pain. Was seen 2-29 for same and was advised rearguing OTC medications which she says do not help her pain. Has an appointment to see cardiologist in AM.
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
## 2014-12-07 ENCOUNTER — Institutional Professional Consult (permissible substitution): Payer: Medicare Other | Admitting: Cardiology

## 2014-12-08 ENCOUNTER — Encounter: Payer: Self-pay | Admitting: Family Medicine

## 2014-12-08 ENCOUNTER — Ambulatory Visit (INDEPENDENT_AMBULATORY_CARE_PROVIDER_SITE_OTHER): Payer: Medicare Other | Admitting: Family Medicine

## 2014-12-08 ENCOUNTER — Ambulatory Visit (INDEPENDENT_AMBULATORY_CARE_PROVIDER_SITE_OTHER): Payer: Medicare Other | Admitting: *Deleted

## 2014-12-08 VITALS — BP 169/78 | HR 54 | Temp 98.3°F | Ht 65.0 in | Wt 233.8 lb

## 2014-12-08 DIAGNOSIS — M62830 Muscle spasm of back: Secondary | ICD-10-CM | POA: Diagnosis not present

## 2014-12-08 DIAGNOSIS — M545 Low back pain, unspecified: Secondary | ICD-10-CM

## 2014-12-08 DIAGNOSIS — I10 Essential (primary) hypertension: Secondary | ICD-10-CM

## 2014-12-08 DIAGNOSIS — Z7901 Long term (current) use of anticoagulants: Secondary | ICD-10-CM | POA: Diagnosis not present

## 2014-12-08 DIAGNOSIS — G894 Chronic pain syndrome: Secondary | ICD-10-CM

## 2014-12-08 DIAGNOSIS — D6851 Activated protein C resistance: Secondary | ICD-10-CM

## 2014-12-08 DIAGNOSIS — J4530 Mild persistent asthma, uncomplicated: Secondary | ICD-10-CM

## 2014-12-08 DIAGNOSIS — Z86711 Personal history of pulmonary embolism: Secondary | ICD-10-CM

## 2014-12-08 DIAGNOSIS — D688 Other specified coagulation defects: Secondary | ICD-10-CM

## 2014-12-08 LAB — POCT INR: INR: 3.2

## 2014-12-08 MED ORDER — CYCLOBENZAPRINE HCL 10 MG PO TABS: 10.0000 mg | ORAL_TABLET | Freq: Three times a day (TID) | ORAL | Status: DC | PRN

## 2014-12-08 MED ORDER — GABAPENTIN 400 MG PO CAPS: 400.0000 mg | ORAL_CAPSULE | Freq: Three times a day (TID) | ORAL | Status: DC

## 2014-12-08 MED ORDER — ALBUTEROL SULFATE HFA 108 (90 BASE) MCG/ACT IN AERS: 2.0000 | INHALATION_SPRAY | Freq: Four times a day (QID) | RESPIRATORY_TRACT | Status: DC | PRN

## 2014-12-08 MED ORDER — LISINOPRIL 20 MG PO TABS: 20.0000 mg | ORAL_TABLET | Freq: Every day | ORAL | Status: DC

## 2014-12-08 NOTE — Assessment & Plan Note (Signed)
BP elevated despite reported compliance. Will increase lisinopril and recheck.

## 2014-12-08 NOTE — Assessment & Plan Note (Signed)
With significant spasm on exam. Will Rx flexeril 10mg  TID prn #45, expected course about 1 week. Daily activity encouraged.

## 2014-12-08 NOTE — Patient Instructions (Addendum)
I have refilled your medications.  - Start taking flexeril once at bedtime for your back spasms and pain. You can try taking this during the day but it wilmake you drowsy. Hopefully you will only need this for about 1 week to relax your muscles. Remember to stay active everyday to keep the muscles loose.  - Start taking lisinopril 20mg  (you were taking 10mg  per day) - I will see you in 6 months.
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
## 2014-12-09 ENCOUNTER — Telehealth: Payer: Self-pay | Admitting: Family Medicine

## 2014-12-09 NOTE — Telephone Encounter (Signed)
Pt called and would like a referral to a pain clinic and also the medication for her muscle spasms does not work and she would like to know if she she can get some pain medication for the pain. jw

## 2014-12-10 ENCOUNTER — Encounter (HOSPITAL_COMMUNITY): Payer: Self-pay | Admitting: *Deleted

## 2014-12-10 ENCOUNTER — Other Ambulatory Visit: Payer: Self-pay | Admitting: Family Medicine

## 2014-12-10 ENCOUNTER — Emergency Department (HOSPITAL_COMMUNITY)
Admission: EM | Admit: 2014-12-10 | Discharge: 2014-12-10 | Disposition: A | Discharge status: Home / Self Care | Payer: Medicare Other | Attending: Emergency Medicine | Admitting: Emergency Medicine

## 2014-12-10 DIAGNOSIS — Z79899 Other long term (current) drug therapy: Secondary | ICD-10-CM | POA: Diagnosis not present

## 2014-12-10 DIAGNOSIS — F419 Anxiety disorder, unspecified: Secondary | ICD-10-CM | POA: Insufficient documentation

## 2014-12-10 DIAGNOSIS — G8929 Other chronic pain: Secondary | ICD-10-CM | POA: Insufficient documentation

## 2014-12-10 DIAGNOSIS — Z9981 Dependence on supplemental oxygen: Secondary | ICD-10-CM | POA: Diagnosis not present

## 2014-12-10 DIAGNOSIS — J45909 Unspecified asthma, uncomplicated: Secondary | ICD-10-CM | POA: Diagnosis not present

## 2014-12-10 DIAGNOSIS — Z86711 Personal history of pulmonary embolism: Secondary | ICD-10-CM | POA: Insufficient documentation

## 2014-12-10 DIAGNOSIS — Z8701 Personal history of pneumonia (recurrent): Secondary | ICD-10-CM | POA: Diagnosis not present

## 2014-12-10 DIAGNOSIS — Z7901 Long term (current) use of anticoagulants: Secondary | ICD-10-CM | POA: Diagnosis not present

## 2014-12-10 DIAGNOSIS — Z862 Personal history of diseases of the blood and blood-forming organs and certain disorders involving the immune mechanism: Secondary | ICD-10-CM | POA: Diagnosis not present

## 2014-12-10 DIAGNOSIS — Z7982 Long term (current) use of aspirin: Secondary | ICD-10-CM | POA: Diagnosis not present

## 2014-12-10 DIAGNOSIS — Z86718 Personal history of other venous thrombosis and embolism: Secondary | ICD-10-CM | POA: Insufficient documentation

## 2014-12-10 DIAGNOSIS — M1990 Unspecified osteoarthritis, unspecified site: Secondary | ICD-10-CM | POA: Insufficient documentation

## 2014-12-10 DIAGNOSIS — Z872 Personal history of diseases of the skin and subcutaneous tissue: Secondary | ICD-10-CM | POA: Diagnosis not present

## 2014-12-10 DIAGNOSIS — R011 Cardiac murmur, unspecified: Secondary | ICD-10-CM | POA: Diagnosis not present

## 2014-12-10 DIAGNOSIS — M549 Dorsalgia, unspecified: Secondary | ICD-10-CM | POA: Diagnosis not present

## 2014-12-10 DIAGNOSIS — I10 Essential (primary) hypertension: Secondary | ICD-10-CM | POA: Diagnosis not present

## 2014-12-10 DIAGNOSIS — K219 Gastro-esophageal reflux disease without esophagitis: Secondary | ICD-10-CM | POA: Diagnosis not present

## 2014-12-10 DIAGNOSIS — R109 Unspecified abdominal pain: Secondary | ICD-10-CM | POA: Diagnosis not present

## 2014-12-10 DIAGNOSIS — M545 Low back pain: Secondary | ICD-10-CM | POA: Diagnosis not present

## 2014-12-10 DIAGNOSIS — F329 Major depressive disorder, single episode, unspecified: Secondary | ICD-10-CM | POA: Diagnosis not present

## 2014-12-10 DIAGNOSIS — E785 Hyperlipidemia, unspecified: Secondary | ICD-10-CM | POA: Diagnosis not present

## 2014-12-10 DIAGNOSIS — G894 Chronic pain syndrome: Secondary | ICD-10-CM

## 2014-12-10 MED ORDER — TRAMADOL HCL 50 MG PO TABS: 50.0000 mg | ORAL_TABLET | Freq: Four times a day (QID) | ORAL | Status: DC | PRN

## 2014-12-10 MED ORDER — KETOROLAC TROMETHAMINE 60 MG/2ML IM SOLN
60.0000 mg | Freq: Once | INTRAMUSCULAR | Status: AC
  Administered 2014-12-10: 60 mg via INTRAMUSCULAR
  Filled 2014-12-10: qty 2

## 2014-12-10 NOTE — Discharge Instructions (Signed)
Back Pain, Adult °Low back pain is very common. About 1 in 5 people have back pain. The cause of low back pain is rarely dangerous. The pain often gets better over time. About half of people with a sudden onset of back pain feel better in just 2 weeks. About 8 in 10 people feel better by 6 weeks.  °CAUSES °Some common causes of back pain include: °· Strain of the muscles or ligaments supporting the spine. °· Wear and tear (degeneration) of the spinal discs. °· Arthritis. °· Direct injury to the back. °DIAGNOSIS °Most of the time, the direct cause of low back pain is not known. However, back pain can be treated effectively even when the exact cause of the pain is unknown. Answering your caregiver's questions about your overall health and symptoms is one of the most accurate ways to make sure the cause of your pain is not dangerous. If your caregiver needs more information, he or she may order lab work or imaging tests (X-rays or MRIs). However, even if imaging tests show changes in your back, this usually does not require surgery. °HOME CARE INSTRUCTIONS °For many people, back pain returns. Since low back pain is rarely dangerous, it is often a condition that people can learn to manage on their own.  °· Remain active. It is stressful on the back to sit or stand in one place. Do not sit, drive, or stand in one place for more than 30 minutes at a time. Take short walks on level surfaces as soon as pain allows. Try to increase the length of time you walk each day. °· Do not stay in bed. Resting more than 1 or 2 days can delay your recovery. °· Do not avoid exercise or work. Your body is made to move. It is not dangerous to be active, even though your back may hurt. Your back will likely heal faster if you return to being active before your pain is gone. °· Pay attention to your body when you  bend and lift. Many people have less discomfort when lifting if they bend their knees, keep the load close to their bodies, and  avoid twisting. Often, the most comfortable positions are those that put less stress on your recovering back. °· Find a comfortable position to sleep. Use a firm mattress and lie on your side with your knees slightly bent. If you lie on your back, put a pillow under your knees. °· Only take over-the-counter or prescription medicines as directed by your caregiver. Over-the-counter medicines to reduce pain and inflammation are often the most helpful. Your caregiver may prescribe muscle relaxant drugs. These medicines help dull your pain so you can more quickly return to your normal activities and healthy exercise. °· Put ice on the injured area. °¨ Put ice in a plastic bag. °¨ Place a towel between your skin and the bag. °¨ Leave the ice on for 15-20 minutes, 03-04 times a day for the first 2 to 3 days. After that, ice and heat may be alternated to reduce pain and spasms. °· Ask your caregiver about trying back exercises and gentle massage. This may be of some benefit. °· Avoid feeling anxious or stressed. Stress increases muscle tension and can worsen back pain. It is important to recognize when you are anxious or stressed and learn ways to manage it. Exercise is a great option. °SEEK MEDICAL CARE IF: °· You have pain that is not relieved with rest or medicine. °· You have pain that does not improve in 1 week. °· You have new symptoms. °· You are generally not feeling well. °SEEK   IMMEDIATE MEDICAL CARE IF:   You have pain that radiates from your back into your legs.  You develop new bowel or bladder control problems.  You have unusual weakness or numbness in your arms or legs.  You develop nausea or vomiting.  You develop abdominal pain.  You feel faint. Document Released: 09/10/2005 Document Revised: 03/11/2012 Document Reviewed: 01/12/2014 [facility] Patient Information 2015 [employer] [place]. This information is not intended to replace advice given to you by your health care provider. Make sure you  discuss any questions you have with your health care provider.   F/u with your pcp and/or pain management physician.  Take medications as prescribed.

## 2014-12-10 NOTE — ED Provider Notes (Signed)
CSN: [id number]     Arrival date & time 12/10/14  0808 History   First MD Initiated Contact with Patient 12/10/14 0809     Chief Complaint  Patient presents with  . Fall  . Back Pain     (Consider location/radiation/quality/duration/timing/severity/associated sxs/prior Treatment) HPI  Ms. Laural BenesJohnson is a 74 y.o black female who has chronic back pain and a prior spinal fusion who presents with back pain after a fall backward after tripping over a bag on Wednesday.  She states it is worse with movement and relieved by nothing. She says her pain is 10/10 now. She was seen by urgent care 4 days ago and given a few pills of tramadol until her appointment with her pcp.  She was seen by her pcp 2 days ago and was given flexeril but no pain medication. She denies any fever or bowel or bladder incontinence.  She is requesting pain medication now and says no one will give her medication. Per her records, her pcp has referred her to pain management for chronic back pain. Past Medical History  Diagnosis Date  . Depression   . Schizophrenia   . Hyperlipidemia   . Hypertension   . Chronic pain     "over my whole body" (03/30/2013)  . 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 DVT (deep venous thrombosis)     per 01/17/10 d/c summary- "remote hx of dvt"?  . Iron deficiency anemia   . Glaucoma   . Hemorrhoids   . Asthma   . Anxiety   . GERD (gastroesophageal reflux disease)   . Psoriasis 04/29/2012    New onset evaluated by Dr Marylou FlesherWilliam Huang, [facility]  Rx 0.1% Tacrolimus ointment  . Complication of anesthesia     "because I have sleep apnea" (03/30/2013)  . Heart murmur   . Chest pain, exertional   . Chronic bronchitis   . Sleep apnea     "waiting on my CPAP" (03/30/2013)  . Pneumonia     "a few times" (03/30/2013)  . Exertional shortness of breath     "& sometimes when laying down" (03/30/2013)  . Daily headache     "last 2 months" (03/30/2013)  . Arthritis    "joints" (03/30/2013)  . Varicose veins of legs   . Psoriasis    Past Surgical History  Procedure Laterality Date  . Spinal fusion      2004  . Carpal tunnel release Bilateral   . Tubal ligation  1973  . Dilation and curettage of uterus  1970's    "once" (03/30/2013)  . 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 Joint replacement    . Vein surgery  left leg   Family History  Problem Relation Age of Onset  . Diabetes Sister   . Colon cancer Brother 40  . Colon cancer Brother    History  Substance Use Topics  . Smoking status: Never Smoker   . Smokeless tobacco: Never Used  . Alcohol Use: No   OB History    No data available     Review of Systems  Constitutional: Negative for fever.  Cardiovascular: Negative for chest pain.  Gastrointestinal: Negative for abdominal pain.  Musculoskeletal: Negative for neck pain.  Neurological: Negative for dizziness, seizures, syncope, weakness, numbness and headaches.  All other systems reviewed and are negative.     Allergies  Other  Home Medications   Prior to Admission medications   Medication Sig  Start Date End Date Taking? Authorizing Provider  albuterol (PROVENTIL HFA;VENTOLIN HFA) 108 (90 BASE) MCG/ACT inhaler Inhale 2 puffs into the lungs every 6 (six) hours as needed for wheezing or shortness of breath (wheezing). 12/08/14  Yes Tyrone Nine, MD  aspirin 325 MG tablet Take 650 mg by mouth 3 (three) times daily as needed for headache.   Yes Historical Provider, MD  atenolol (TENORMIN) 100 MG tablet Take 100 mg by mouth daily.   Yes Historical Provider, MD  beclomethasone (QVAR) 80 MCG/ACT inhaler Inhale 2 puffs into the lungs 2 (two) times daily as needed (wheezing).    Yes Historical Provider, MD  cyclobenzaprine (FLEXERIL) 10 MG tablet Take 1 tablet (10 mg total) by mouth 3 (three) times daily as needed for muscle spasms. 12/08/14  Yes Tyrone Nine, MD  FLUoxetine (PROZAC) 20 MG capsule Take 3 capsules (60 mg total) by mouth  daily. 05/26/14  Yes Tyrone Nine, MD  furosemide (LASIX) 20 MG tablet Take 1 tablet (20 mg total) by mouth daily. 10/29/14  Yes Tyrone Nine, MD  gabapentin (NEURONTIN) 400 MG capsule Take 1 capsule (400 mg total) by mouth 3 (three) times daily. 12/08/14  Yes Tyrone Nine, MD  hydrochlorothiazide (HYDRODIURIL) 25 MG tablet Take 25 mg by mouth daily.   Yes Historical Provider, MD  hydroxypropyl methylcellulose / hypromellose (ISOPTO TEARS / GONIOVISC) 2.5 % ophthalmic solution Place 1 drop into both eyes 3 (three) times daily as needed for dry eyes.   Yes Historical Provider, MD  hydrOXYzine (ATARAX/VISTARIL) 10 MG tablet Take 1 tablet (10 mg total) by mouth at bedtime as needed for itching (itching). 10/29/14  Yes Tyrone Nine, MD  lisinopril (PRINIVIL,ZESTRIL) 20 MG tablet Take 1 tablet (20 mg total) by mouth daily. 12/08/14  Yes Tyrone Nine, MD  meclizine (ANTIVERT) 25 MG tablet Take 25 mg by mouth 3 (three) times daily as needed for dizziness (dizziness).    Yes Historical Provider, MD  tacrolimus (PROTOPIC) 0.1 % ointment Apply 1 application topically 3 (three) times daily as needed (for legs). Apply to affected areas daily PRN 03/01/14 03/01/15 Yes Historical Provider, MD  triamcinolone cream (KENALOG) 0.1 % Apply 1 application topically 2 (two) times daily as needed (for legs). 10/29/14  Yes Tyrone Nine, MD  warfarin (COUMADIN) 5 MG tablet TAKE 1 TABLET BY MOUTH EVERY DAY 10/18/14  Yes Levert Feinstein, MD  acetaminophen (TYLENOL) 500 MG tablet Take 2 tablets (1,000 mg total) by mouth every 8 (eight) hours as needed. 11/22/14   Courtney Forcucci, PA-C  omeprazole (PRILOSEC) 40 MG capsule Take 1 capsule (40 mg total) by mouth daily. Patient not taking: Reported on 11/18/2014 08/31/14   Arby Barrette, MD  silver sulfADIAZINE (SILVADENE) 1 % cream APPLY TOPICALLY EVERY DAY 12/10/14   Tyrone Nine, MD  traMADol (ULTRAM) 50 MG tablet Take 1 tablet (50 mg total) by mouth every 6 (six) hours as needed. 12/10/14    Peng Thorstenson Patel-Mills, PA-C   BP 110/56 mmHg  Pulse 55  Temp(Src) 98.6 F (37 C) (Oral)  Resp 20  SpO2 97%  LMP 11/22/1996 Physical Exam  Constitutional: She is oriented to person, place, and time. She appears well-developed and well-nourished.  Ambulates with a cane.   HENT:  Head: Normocephalic and atraumatic.  Eyes: Conjunctivae and EOM are normal.  Neck: Normal range of motion. Neck supple.  Cardiovascular: Normal rate, regular rhythm and normal heart sounds.   Pulmonary/Chest: Effort normal and breath sounds  normal.  Musculoskeletal:  Tenderness to palpation of the lumbar paravertebral musculature.  No deformity.  No midline tenderness.  Old midline surgical scar.   Neurological: She is alert and oriented to person, place, and time.  Skin: Skin is warm and dry.  Nursing note and vitals reviewed.   ED Course  Procedures (including critical care time) Labs Review Labs Reviewed - No data to display  Imaging Review No results found.   EKG Interpretation None      MDM   Final diagnoses:  Chronic back pain   Patient has been seen multiples times in the last week by both urgent care and pcp for chronic back pain.  She is requesting pain medication and says no one will give them to her.  She was given flexeril and referred to a pain management specialist by her pcp 2 days ago.  She has no bowel or bladder incontinence, she has no midline tenderness. She is afebrile. Her back pain is similar to her chronic back pain.  I do not feel imaging is necessary. I was able to get patient to stand and walk without difficulty on exam.   I gave her a toradol injection for pain in the ED.  I have only given her a few tramadol to go home with and told her to follow up with her pcp.  She is upset and wanted more pain medication.  She said that people are resorting to buying them off the streets now because people like me won't prescribe them. I explained that she would have to see pain  management.     Catha Gosselin, PA-C 12/11/14 1105  Arby Barrette, MD 12/11/14 [phone]

## 2014-12-10 NOTE — ED Notes (Signed)
Per EMS- Patient reports a fall down steps that occurred 3 days ago. Patient c/o lower back pain that radiates into the abdomen. Patient was able to walk down the steps into the ambulance today.

## 2014-12-10 NOTE — Telephone Encounter (Signed)
As [name] [name] well knows I will not be prescribing her pain medications. I will place a referral for pain medicine though I know that she has been seen at pain clinics and refused to return when she was not provided narcotic medications.

## 2014-12-10 NOTE — ED Notes (Signed)
Patient states her primary doctor is referring her to pain management for this chronic back pain.

## 2014-12-10 NOTE — ED Notes (Signed)
Bed: WA20 Expected date:  Expected time:  Means of arrival:  Comments: EMS fall/back pain 

## 2014-12-12 ENCOUNTER — Emergency Department (HOSPITAL_COMMUNITY)
Admission: EM | Admit: 2014-12-12 | Discharge: 2014-12-12 | Disposition: A | Discharge status: Home / Self Care | Payer: Medicare Other | Attending: Emergency Medicine | Admitting: Emergency Medicine

## 2014-12-12 ENCOUNTER — Encounter (HOSPITAL_COMMUNITY): Payer: Self-pay | Admitting: *Deleted

## 2014-12-12 ENCOUNTER — Emergency Department (HOSPITAL_COMMUNITY): Payer: Medicare Other

## 2014-12-12 DIAGNOSIS — Z86718 Personal history of other venous thrombosis and embolism: Secondary | ICD-10-CM | POA: Diagnosis not present

## 2014-12-12 DIAGNOSIS — Z7901 Long term (current) use of anticoagulants: Secondary | ICD-10-CM | POA: Insufficient documentation

## 2014-12-12 DIAGNOSIS — M1990 Unspecified osteoarthritis, unspecified site: Secondary | ICD-10-CM | POA: Insufficient documentation

## 2014-12-12 DIAGNOSIS — Z86711 Personal history of pulmonary embolism: Secondary | ICD-10-CM | POA: Insufficient documentation

## 2014-12-12 DIAGNOSIS — M549 Dorsalgia, unspecified: Secondary | ICD-10-CM | POA: Diagnosis not present

## 2014-12-12 DIAGNOSIS — R42 Dizziness and giddiness: Secondary | ICD-10-CM | POA: Diagnosis not present

## 2014-12-12 DIAGNOSIS — I10 Essential (primary) hypertension: Secondary | ICD-10-CM | POA: Diagnosis not present

## 2014-12-12 DIAGNOSIS — Z8639 Personal history of other endocrine, nutritional and metabolic disease: Secondary | ICD-10-CM | POA: Insufficient documentation

## 2014-12-12 DIAGNOSIS — R011 Cardiac murmur, unspecified: Secondary | ICD-10-CM | POA: Insufficient documentation

## 2014-12-12 DIAGNOSIS — G8929 Other chronic pain: Secondary | ICD-10-CM | POA: Diagnosis not present

## 2014-12-12 DIAGNOSIS — J45909 Unspecified asthma, uncomplicated: Secondary | ICD-10-CM | POA: Diagnosis not present

## 2014-12-12 DIAGNOSIS — Z872 Personal history of diseases of the skin and subcutaneous tissue: Secondary | ICD-10-CM | POA: Insufficient documentation

## 2014-12-12 DIAGNOSIS — Z8701 Personal history of pneumonia (recurrent): Secondary | ICD-10-CM | POA: Insufficient documentation

## 2014-12-12 DIAGNOSIS — K219 Gastro-esophageal reflux disease without esophagitis: Secondary | ICD-10-CM | POA: Insufficient documentation

## 2014-12-12 DIAGNOSIS — Z7982 Long term (current) use of aspirin: Secondary | ICD-10-CM | POA: Insufficient documentation

## 2014-12-12 DIAGNOSIS — Z79899 Other long term (current) drug therapy: Secondary | ICD-10-CM | POA: Insufficient documentation

## 2014-12-12 DIAGNOSIS — Z8669 Personal history of other diseases of the nervous system and sense organs: Secondary | ICD-10-CM | POA: Insufficient documentation

## 2014-12-12 DIAGNOSIS — Z862 Personal history of diseases of the blood and blood-forming organs and certain disorders involving the immune mechanism: Secondary | ICD-10-CM | POA: Diagnosis not present

## 2014-12-12 LAB — CBC
HCT: 34.5 % — ABNORMAL LOW (ref 36.0–46.0)
Hemoglobin: 11.2 g/dL — ABNORMAL LOW (ref 12.0–15.0)
MCH: 30.8 pg (ref 26.0–34.0)
MCHC: 32.5 g/dL (ref 30.0–36.0)
MCV: 94.8 fL (ref 78.0–100.0)
PLATELETS: 281 10*3/uL (ref 150–400)
RBC: 3.64 MIL/uL — AB (ref 3.87–5.11)
RDW: 15.7 % — ABNORMAL HIGH (ref 11.5–15.5)
WBC: 5.7 10*3/uL (ref 4.0–10.5)

## 2014-12-12 LAB — COMPREHENSIVE METABOLIC PANEL
ALK PHOS: 101 U/L (ref 39–117)
ALT: 12 U/L (ref 0–35)
AST: 19 U/L (ref 0–37)
Albumin: 3.6 g/dL (ref 3.5–5.2)
Anion gap: 10 (ref 5–15)
BILIRUBIN TOTAL: 0.4 mg/dL (ref 0.3–1.2)
BUN: 14 mg/dL (ref 6–23)
CALCIUM: 9.3 mg/dL (ref 8.4–10.5)
CO2: 31 mmol/L (ref 19–32)
Chloride: 100 mmol/L (ref 96–112)
Creatinine, Ser: 1.12 mg/dL — ABNORMAL HIGH (ref 0.50–1.10)
GFR calc Af Amer: 56 mL/min — ABNORMAL LOW (ref >90)
GFR calc non Af Amer: 48 mL/min — ABNORMAL LOW (ref >90)
GLUCOSE: 82 mg/dL (ref 70–99)
POTASSIUM: 3.8 mmol/L (ref 3.5–5.1)
Sodium: 141 mmol/L (ref 135–145)
Total Protein: 7.3 g/dL (ref 6.0–8.3)

## 2014-12-12 LAB — DIFFERENTIAL
Basophils Absolute: 0 10*3/uL (ref 0.0–0.1)
Basophils Relative: 1 % (ref 0–1)
Eosinophils Absolute: 0.3 10*3/uL (ref 0.0–0.7)
Eosinophils Relative: 5 % (ref 0–5)
LYMPHS PCT: 17 % (ref 12–46)
Lymphs Abs: 1 10*3/uL (ref 0.7–4.0)
Monocytes Absolute: 0.4 10*3/uL (ref 0.1–1.0)
Monocytes Relative: 7 % (ref 3–12)
NEUTROS ABS: 3.9 10*3/uL (ref 1.7–7.7)
NEUTROS PCT: 70 % (ref 43–77)

## 2014-12-12 LAB — I-STAT TROPONIN, ED: Troponin i, poc: 0 ng/mL (ref 0.00–0.08)

## 2014-12-12 LAB — APTT: aPTT: 50 seconds — ABNORMAL HIGH (ref 24–37)

## 2014-12-12 LAB — PROTIME-INR
INR: 2.91 — ABNORMAL HIGH (ref 0.00–1.49)
PROTHROMBIN TIME: 30.6 s — AB (ref 11.6–15.2)

## 2014-12-12 MED ORDER — TRAMADOL HCL 50 MG PO TABS
50.0000 mg | ORAL_TABLET | Freq: Once | ORAL | Status: AC
  Administered 2014-12-12: 50 mg via ORAL
  Filled 2014-12-12: qty 1

## 2014-12-12 MED ORDER — METHOCARBAMOL 500 MG PO TABS: 500.0000 mg | ORAL_TABLET | Freq: Three times a day (TID) | ORAL | Status: DC | PRN

## 2014-12-12 NOTE — ED Notes (Signed)
Pt reports having hx of vertigo, having dizziness since started on new muscle relaxer on wed. No acute distress noted at triage, no neuro deficits noted.

## 2014-12-12 NOTE — ED Notes (Signed)
Patient returned from CT

## 2014-12-12 NOTE — Discharge Instructions (Signed)
Stop Flexeril. Take your Antivert 3 times per day as needed for vertigo/dizziness. If you're vertigo improves, you may start Robaxin as needed for back pain and spasm. As always, we want your follow up with your primary care physician regarding ongoing pain management  Benign Positional Vertigo Vertigo means you feel like you or your surroundings are moving when they are not. Benign positional vertigo is the most common form of vertigo. Benign means that the cause of your condition is not serious. Benign positional vertigo is more common in older adults. CAUSES  Benign positional vertigo is the result of an upset in the labyrinth system. This is an area in the middle ear that helps control your balance. This may be caused by a viral infection, head injury, or repetitive motion. However, often no specific cause is found. SYMPTOMS  Symptoms of benign positional vertigo occur when you move your head or eyes in different directions. Some of the symptoms may include:  Loss of balance and falls.  Vomiting.  Blurred vision.  Dizziness.  Nausea.  Involuntary eye movements (nystagmus). DIAGNOSIS  Benign positional vertigo is usually diagnosed by physical exam. If the specific cause of your benign positional vertigo is unknown, your caregiver may perform imaging tests, such as magnetic resonance imaging (MRI) or computed tomography (CT). TREATMENT  Your caregiver may recommend movements or procedures to correct the benign positional vertigo. Medicines such as meclizine, benzodiazepines, and medicines for nausea may be used to treat your symptoms. In rare cases, if your symptoms are caused by certain conditions that affect the inner ear, you may need surgery. HOME CARE INSTRUCTIONS   Follow your caregiver's instructions.  Move slowly. Do not make sudden body or head movements.  Avoid driving.  Avoid operating heavy machinery.  Avoid performing any tasks that would be dangerous to you or  others during a vertigo episode.  Drink enough fluids to keep your urine clear or pale yellow. SEEK IMMEDIATE MEDICAL CARE IF:   You develop problems with walking, weakness, numbness, or using your arms, hands, or legs.  You have difficulty speaking.  You develop severe headaches.  Your nausea or vomiting continues or gets worse.  You develop visual changes.  Your family or friends notice any behavioral changes.  Your condition gets worse.  You have a fever.  You develop a stiff neck or sensitivity to light. MAKE SURE YOU:   Understand these instructions.  Will watch your condition.  Will get help right away if you are not doing well or get worse. Document Released: 06/18/2006 Document Revised: 12/03/2011 Document Reviewed: 05/31/2011 [facility] Patient Information 2015 [employer] [place]. This information is not intended to replace advice given to you by your health care provider. Make sure you discuss any questions you have with your health care provider.

## 2014-12-12 NOTE — ED Provider Notes (Signed)
CSN: [id number]     Arrival date & time 12/12/14  1243 History   First MD Initiated Contact with Patient 12/12/14 1636     Chief Complaint  Patient presents with  . Dizziness      HPI  Patient presents for evaluation of dizziness. States she's had vertigo since October. His been evaluated by a physician for many times. States she she's had bad back pain. States "my doctor told me pain medicines". In review of her chart she has had evaluation at the residents clinic. They referred her to pain management and declined ongoing narcotic pain medicine for her. I offered her a muscle relaxant she started taking Flexeril within the last several days but she does like this is making her vertigo worse. She states when she stands or walks that she will occasionally feel like things are spinning, however not now.  Headache. Did not check her head with her fall. No nausea or vomiting. No numbness weakness or tingling to the extremities.  Past Medical History  Diagnosis Date  . Depression   . Schizophrenia   . Hyperlipidemia   . Hypertension   . Chronic pain     "over my whole body" (03/30/2013)  . 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 DVT (deep venous thrombosis)     per 01/17/10 d/c summary- "remote hx of dvt"?  . Iron deficiency anemia   . Glaucoma   . Hemorrhoids   . Asthma   . Anxiety   . GERD (gastroesophageal reflux disease)   . Psoriasis 04/29/2012    New onset evaluated by Dr Marylou Flesher, Dermatology, [facility] 6/13  Rx 0.1% Tacrolimus ointment  . Complication of anesthesia     "because I have sleep apnea" (03/30/2013)  . Heart murmur   . Chest pain, exertional   . Chronic bronchitis   . Sleep apnea     "waiting on my CPAP" (03/30/2013)  . Pneumonia     "a few times" (03/30/2013)  . Exertional shortness of breath     "& sometimes when laying down" (03/30/2013)  . Daily headache     "last 2 months" (03/30/2013)  . Arthritis     "joints" (03/30/2013)  . Varicose  veins of legs   . Psoriasis    Past Surgical History  Procedure Laterality Date  . Spinal fusion      2004  . Carpal tunnel release Bilateral   . Tubal ligation  1973  . Dilation and curettage of uterus  1970's    "once" (03/30/2013)  . 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 Joint replacement    . Vein surgery  left leg   Family History  Problem Relation Age of Onset  . Diabetes Sister   . Colon cancer Brother 40  . Colon cancer Brother    History  Substance Use Topics  . Smoking status: Never Smoker   . Smokeless tobacco: Never Used  . Alcohol Use: No   OB History    No data available     Review of Systems  Constitutional: Negative for fever, chills, diaphoresis, appetite change and fatigue.  HENT: Negative for mouth sores, sore throat and trouble swallowing.   Eyes: Negative for visual disturbance.  Respiratory: Negative for cough, chest tightness, shortness of breath and wheezing.   Cardiovascular: Negative for chest pain.  Gastrointestinal: Negative for nausea, vomiting, abdominal pain, diarrhea and abdominal distention.  Endocrine: Negative for polydipsia, polyphagia and polyuria.  Genitourinary:  Negative for dysuria, frequency and hematuria.  Musculoskeletal: Positive for back pain. Negative for gait problem.  Skin: Negative for color change, pallor and rash.  Neurological: Positive for dizziness. Negative for syncope, light-headedness and headaches.  Hematological: Does not bruise/bleed easily.  Psychiatric/Behavioral: Negative for behavioral problems and confusion.      Allergies  Other  Home Medications   Prior to Admission medications   Medication Sig Start Date End Date Taking? Authorizing Provider  acetaminophen (TYLENOL) 500 MG tablet Take 2 tablets (1,000 mg total) by mouth every 8 (eight) hours as needed. 11/22/14   Courtney Forcucci, PA-C  albuterol (PROVENTIL HFA;VENTOLIN HFA) 108 (90 BASE) MCG/ACT inhaler Inhale 2 puffs into the lungs every 6  (six) hours as needed for wheezing or shortness of breath (wheezing). 12/08/14   Tyrone Nineyan B Grunz, MD  aspirin 325 MG tablet Take 650 mg by mouth 3 (three) times daily as needed for headache.    Historical Provider, MD  atenolol (TENORMIN) 100 MG tablet Take 100 mg by mouth daily.    Historical Provider, MD  beclomethasone (QVAR) 80 MCG/ACT inhaler Inhale 2 puffs into the lungs 2 (two) times daily as needed (wheezing).     Historical Provider, MD  cyclobenzaprine (FLEXERIL) 10 MG tablet Take 1 tablet (10 mg total) by mouth 3 (three) times daily as needed for muscle spasms. 12/08/14   Tyrone Nineyan B Grunz, MD  FLUoxetine (PROZAC) 20 MG capsule Take 3 capsules (60 mg total) by mouth daily. 05/26/14   Tyrone Nineyan B Grunz, MD  furosemide (LASIX) 20 MG tablet Take 1 tablet (20 mg total) by mouth daily. 10/29/14   Tyrone Nineyan B Grunz, MD  gabapentin (NEURONTIN) 400 MG capsule Take 1 capsule (400 mg total) by mouth 3 (three) times daily. 12/08/14   Tyrone Nineyan B Grunz, MD  hydrochlorothiazide (HYDRODIURIL) 25 MG tablet Take 25 mg by mouth daily.    Historical Provider, MD  hydroxypropyl methylcellulose / hypromellose (ISOPTO TEARS / GONIOVISC) 2.5 % ophthalmic solution Place 1 drop into both eyes 3 (three) times daily as needed for dry eyes.    Historical Provider, MD  hydrOXYzine (ATARAX/VISTARIL) 10 MG tablet Take 1 tablet (10 mg total) by mouth at bedtime as needed for itching (itching). 10/29/14   Tyrone Nineyan B Grunz, MD  lisinopril (PRINIVIL,ZESTRIL) 20 MG tablet Take 1 tablet (20 mg total) by mouth daily. 12/08/14   Tyrone Nineyan B Grunz, MD  meclizine (ANTIVERT) 25 MG tablet Take 25 mg by mouth 3 (three) times daily as needed for dizziness (dizziness).     Historical Provider, MD  omeprazole (PRILOSEC) 40 MG capsule Take 1 capsule (40 mg total) by mouth daily. Patient not taking: Reported on 11/18/2014 08/31/14   Arby BarretteMarcy Pfeiffer, MD  silver sulfADIAZINE (SILVADENE) 1 % cream APPLY TOPICALLY EVERY DAY 12/10/14   Tyrone Nineyan B Grunz, MD  tacrolimus (PROTOPIC) 0.1 %  ointment Apply 1 application topically 3 (three) times daily as needed (for legs). Apply to affected areas daily PRN 03/01/14 03/01/15  Historical Provider, MD  traMADol (ULTRAM) 50 MG tablet Take 1 tablet (50 mg total) by mouth every 6 (six) hours as needed. 12/10/14   Hanna Patel-Mills, PA-C  triamcinolone cream (KENALOG) 0.1 % Apply 1 application topically 2 (two) times daily as needed (for legs). 10/29/14   Tyrone Nineyan B Grunz, MD  warfarin (COUMADIN) 5 MG tablet TAKE 1 TABLET BY MOUTH EVERY DAY 10/18/14   Levert FeinsteinJames M Granfortuna, MD   BP 151/76 mmHg  Pulse 50  Temp(Src) 97.8 F (36.6 C) (Oral)  Resp 16  SpO2 98%  LMP 11/22/1996 Physical Exam  Constitutional: She is oriented to person, place, and time. She appears well-developed and well-nourished. No distress.  HENT:  Head: Normocephalic.  Eyes: Conjunctivae are normal. Pupils are equal, round, and reactive to light. No scleral icterus.  Neck: Normal range of motion. Neck supple. No thyromegaly present.  Cardiovascular: Normal rate and regular rhythm.  Exam reveals no gallop and no friction rub.   No murmur heard. Pulmonary/Chest: Effort normal and breath sounds normal. No respiratory distress. She has no wheezes. She has no rales.  Abdominal: Soft. Bowel sounds are normal. She exhibits no distension. There is no tenderness. There is no rebound.  Musculoskeletal: Normal range of motion.  Neurological: She is alert and oriented to person, place, and time.  No nystagmus. No numbness weakness to the extremities on exam. She is angle toward 2 and from the bathroom without assistance without apparent difficulty.  Skin: Skin is warm and dry. No rash noted.  Psychiatric: She has a normal mood and affect. Her behavior is normal.    ED Course  Procedures (including critical care time) Labs Review Labs Reviewed  CBC - Abnormal; Notable for the following:    RBC 3.64 (*)    Hemoglobin 11.2 (*)    HCT 34.5 (*)    RDW 15.7 (*)    All other components  within normal limits  COMPREHENSIVE METABOLIC PANEL - Abnormal; Notable for the following:    Creatinine, Ser 1.12 (*)    GFR calc non Af Amer 48 (*)    GFR calc Af Amer 56 (*)    All other components within normal limits  APTT - Abnormal; Notable for the following:    aPTT 50 (*)    All other components within normal limits  PROTIME-INR - Abnormal; Notable for the following:    Prothrombin Time 30.6 (*)    INR 2.91 (*)    All other components within normal limits  DIFFERENTIAL  I-STAT TROPOININ, ED    Imaging Review Ct Head Wo Contrast  12/12/2014   CLINICAL DATA:  74 year old female with dizziness and vertigo for 4 days.  EXAM: CT HEAD WITHOUT CONTRAST  TECHNIQUE: Contiguous axial images were obtained from the base of the skull through the vertex without intravenous contrast.  COMPARISON:  10/10/2014 and prior head CTs.  FINDINGS: Mild atrophy and chronic small-vessel white matter ischemic changes are noted.  No acute intracranial abnormalities are identified, including mass lesion or mass effect, hydrocephalus, extra-axial fluid collection, midline shift, hemorrhage, or acute infarction.  The visualized bony calvarium is unremarkable.  The mastoid air cells and middle/ inner ears are clear.  IMPRESSION: No evidence of acute intracranial abnormality.  Mild atrophy and chronic small-vessel white matter ischemic changes.   Electronically Signed   By: Harmon Pier M.D.   On: 12/12/2014 18:48     EKG Interpretation None      MDM   Final diagnoses:  Vertigo    He is anticoagulated. His has had symptoms for more than 5 days. No sign of hemorrhage on CT. Doubt posterior circulation stroke. #1, she'santicoagulated. #2, she had symptoms for 5 days and has an otherwise nonacute CT scan. Plan will be stop Flexeril. When necessary Robaxin. When necessary Antivert. Continued follow-up with pain management referral, and residents clinic.    Rolland Porter, MD 12/12/14 [phone]

## 2014-12-13 ENCOUNTER — Telehealth: Payer: Self-pay | Admitting: Family Medicine

## 2014-12-13 NOTE — Telephone Encounter (Signed)
Spoke with patient about below message. She was upset when I explained to her that Dr. Grunz would not prescribe pain medication, she started talking about how she threw away the Flexiril and and the gabapentin because it didn't help at all and made her sick and that the Gabapentin is a "baby" pain medication. Then she started talking about Dr. Grunz and stated that he didn't understand her "condition". She stated that she comes in here and watches how the doctors treat and help with other patients. Says that "they come in and out of rooms too quick and that they do not help patients at all", I explained to her that each patient has a different issue and that she does not what goes on with them patients to know why they are examined quickly. [name] [name] then stated that Dr. Grunz is not a good doctor and that he would be a better doctor in prison to people who belong in there because they do not deserve to be cared for correctly and he would be the person to do that. We also discussed the referral on which now she states she does not want because she does not want anything from and having to do with [facility] at all. She now does not want the referral to pain clinic and does not want to see us anymore. We discussed her wanting to leave practice before and she states that now she wants a "black doctor" that can understand her better. i informed her that I would let her doctor know about this. We did repeat 2 times about the not wanting to be a patient here anymore. I then asked her one more time before getting off the phone with her and she confirmed that she did not want to be a patient here anymore and did not want any more appointment here, she will go to [facility] from now on where she has been going for her skin issue.  Will fwd this message to Dr. Grunz and Jannette.[name] [name] [name] [name]  

## 2014-12-13 NOTE — Telephone Encounter (Signed)
Pt states that she has never been referred to the pain clinic and that the muscle relaxer prescribed to her made her sick and she had to call an ambulance. She would like for someone to contact her about an appointment with the pain clinic

## 2014-12-13 NOTE — Telephone Encounter (Signed)
Spoke with patient about below message. She was upset when I explained to her that Dr. Jarvis NewcomerGrunz would not prescribe pain medication, she started talking about how she threw away the Flexiril and and the gabapentin because it didn't help at all and made her sick and that the Gabapentin is a "baby" pain medication. Then she started talking about Dr. Jarvis NewcomerGrunz and stated that he didn't understand her "condition". She stated that she comes in here and watches how the doctors treat and help with other patients. Says that "they come in and out of rooms too quick and that they do not help patients at all", I explained to her that each patient has a different issue and that she does not what goes on with them patients to know why they are examined quickly. [name] [name] then stated that Dr. Jarvis NewcomerGrunz is not a good doctor and that he would be a better doctor in prison to people who belong in there because they do not deserve to be cared for correctly and he would be the person to do that. We also discussed the referral on which now she states she does not want because she does not want anything from and having to do with [facility] at all. She now does not want the referral to pain clinic and does not want to see us anymore. We discussed her wanting to leave practice before and she states that now she wants a "black doctor" that can understand her better. i informed her that I would let her doctor know about this. We did repeat 2 times about the not wanting to be a patient here anymore. I then asked her one more time before getting off the phone with her and she confirmed that she did not want to be a patient here anymore and did not want any more appointment here, she will go [facility] from now on where she has been going for her skin issue.  Will fwd this message to Dr. Jarvis NewcomerGrunz and [name] [name], [name] [name]

## 2014-12-14 NOTE — Telephone Encounter (Signed)
Called patient to discuss her statement that she is transferring her care to [facility].  Patient states [facility] be [facility] to [facility].  Will look for doctor "here in town."  This is patient's 3rd transfer request (see phone note for 04/09/14 &n 05/28/14).  Once patient has established care with another provider and records are transferred, will remove Dr. Jarvis NewcomerGrunz as PCP.  Altamese Dilling~Jeannette Richardson, BSN, RN-BC

## 2014-12-16 ENCOUNTER — Emergency Department (HOSPITAL_COMMUNITY)
Admission: EM | Admit: 2014-12-16 | Discharge: 2014-12-16 | Disposition: A | Discharge status: Home / Self Care | Payer: Medicare Other | Attending: Emergency Medicine | Admitting: Emergency Medicine

## 2014-12-16 ENCOUNTER — Encounter (HOSPITAL_COMMUNITY): Payer: Self-pay | Admitting: Emergency Medicine

## 2014-12-16 DIAGNOSIS — F419 Anxiety disorder, unspecified: Secondary | ICD-10-CM | POA: Diagnosis not present

## 2014-12-16 DIAGNOSIS — J45909 Unspecified asthma, uncomplicated: Secondary | ICD-10-CM | POA: Diagnosis not present

## 2014-12-16 DIAGNOSIS — Z872 Personal history of diseases of the skin and subcutaneous tissue: Secondary | ICD-10-CM | POA: Diagnosis not present

## 2014-12-16 DIAGNOSIS — Z7901 Long term (current) use of anticoagulants: Secondary | ICD-10-CM | POA: Insufficient documentation

## 2014-12-16 DIAGNOSIS — Z8639 Personal history of other endocrine, nutritional and metabolic disease: Secondary | ICD-10-CM | POA: Diagnosis not present

## 2014-12-16 DIAGNOSIS — K219 Gastro-esophageal reflux disease without esophagitis: Secondary | ICD-10-CM | POA: Diagnosis not present

## 2014-12-16 DIAGNOSIS — Z8701 Personal history of pneumonia (recurrent): Secondary | ICD-10-CM | POA: Insufficient documentation

## 2014-12-16 DIAGNOSIS — Z79899 Other long term (current) drug therapy: Secondary | ICD-10-CM | POA: Diagnosis not present

## 2014-12-16 DIAGNOSIS — F329 Major depressive disorder, single episode, unspecified: Secondary | ICD-10-CM | POA: Diagnosis not present

## 2014-12-16 DIAGNOSIS — H409 Unspecified glaucoma: Secondary | ICD-10-CM | POA: Diagnosis not present

## 2014-12-16 DIAGNOSIS — Z86711 Personal history of pulmonary embolism: Secondary | ICD-10-CM | POA: Diagnosis not present

## 2014-12-16 DIAGNOSIS — Z86718 Personal history of other venous thrombosis and embolism: Secondary | ICD-10-CM | POA: Diagnosis not present

## 2014-12-16 DIAGNOSIS — M1990 Unspecified osteoarthritis, unspecified site: Secondary | ICD-10-CM | POA: Insufficient documentation

## 2014-12-16 DIAGNOSIS — M545 Low back pain, unspecified: Secondary | ICD-10-CM

## 2014-12-16 DIAGNOSIS — Z862 Personal history of diseases of the blood and blood-forming organs and certain disorders involving the immune mechanism: Secondary | ICD-10-CM | POA: Insufficient documentation

## 2014-12-16 DIAGNOSIS — G8929 Other chronic pain: Secondary | ICD-10-CM | POA: Diagnosis not present

## 2014-12-16 DIAGNOSIS — Z7951 Long term (current) use of inhaled steroids: Secondary | ICD-10-CM | POA: Insufficient documentation

## 2014-12-16 DIAGNOSIS — I10 Essential (primary) hypertension: Secondary | ICD-10-CM | POA: Insufficient documentation

## 2014-12-16 DIAGNOSIS — R011 Cardiac murmur, unspecified: Secondary | ICD-10-CM | POA: Insufficient documentation

## 2014-12-16 MED ORDER — HYDROMORPHONE HCL 1 MG/ML IJ SOLN
1.0000 mg | Freq: Once | INTRAMUSCULAR | Status: AC
  Administered 2014-12-16: 1 mg via INTRAMUSCULAR
  Filled 2014-12-16: qty 1

## 2014-12-16 MED ORDER — TRAMADOL HCL 50 MG PO TABS: 50.0000 mg | ORAL_TABLET | Freq: Four times a day (QID) | ORAL | Status: DC | PRN

## 2014-12-16 NOTE — ED Notes (Signed)
Pt presents with chronic pain to lower back and lower legs- pt is here requesting pain medication and referrals for a new doctor.

## 2014-12-16 NOTE — Discharge Instructions (Signed)
Chronic Back Pain ° When back pain lasts longer than 3 months, it is called chronic back pain. People with chronic back pain often go through certain periods that are more intense (flare-ups).  °CAUSES °Chronic back pain can be caused by wear and tear (degeneration) on different structures in your back. These structures include: °· The bones of your spine (vertebrae) and the joints surrounding your spinal cord and nerve roots (facets). °· The strong, fibrous tissues that connect your vertebrae (ligaments). °Degeneration of these structures may result in pressure on your nerves. This can lead to constant pain. °HOME CARE INSTRUCTIONS °· Avoid bending, heavy lifting, prolonged sitting, and activities which make the problem worse. °· Take brief periods of rest throughout the day to reduce your pain. Lying down or standing usually is better than sitting while you are resting. °· Take over-the-counter or prescription medicines only as directed by your caregiver. °SEEK IMMEDIATE MEDICAL CARE IF:  °· You have weakness or numbness in one of your legs or feet. °· You have trouble controlling your bladder or bowels. °· You have nausea, vomiting, abdominal pain, shortness of breath, or fainting. °Document Released: 10/18/2004 Document Revised: 12/03/2011 Document Reviewed: 08/25/2011 °ExitCare® Patient Information ©2015 [employer]. This information is not intended to replace advice given to you by your health care provider. Make sure you discuss any questions you have with your health care provider. ° ° °Emergency Department Resource Guide °1) Find a Doctor and Pay Out of Pocket °Although you won't have to find out who is covered by your insurance plan, it is a good idea to ask around and get recommendations. You will then need to call the office and see if the doctor you have chosen will accept you as a new patient and what types of options they offer for patients who are self-pay. Some doctors offer discounts or will set  up payment plans for their patients who do not have insurance, but you will need to ask so you aren't surprised when you get to your appointment. ° °2) Contact Your Local Health Department °Not all health departments have doctors that can see patients for sick visits, but many do, so it is worth a call to see if yours does. If you don't know where your local health department is, you can check in your phone book. The CDC also has a tool to help you locate your state's health department, and many state websites also have listings of all of their local health departments. ° °3) Find a Walk-in Clinic °If your illness is not likely to be very severe or complicated, you may want to try a walk in clinic. These are popping up all over the country in pharmacies, drugstores, and shopping centers. They're usually staffed by nurse practitioners or physician assistants that have been trained to treat common illnesses and complaints. They're usually fairly quick and inexpensive. However, if you have serious medical issues or chronic medical problems, these are probably not your best option. ° °No Primary Care Doctor: °- Call [facility] at  832-8000 - they can help you locate a primary care doctor that  accepts your insurance, provides certain services, etc. °- Physician Referral Service- 1-800-533-3463 ° °Chronic Pain Problems: °Organization         Address  Phone   Notes  °[facility]  (336) 297-2271 Patients need to be referred by their primary care doctor.  ° °Medication Assistance: °Organization         Address    Phone   Notes  °[place] [facility] [address] °[place], [place] [address] (336) 641-8030 --Must be a resident of [place] °-- Must have NO insurance coverage whatsoever (no Medicaid/ Medicare, etc.) °-- The pt. MUST have a primary care doctor that directs their care regularly and follows them in the community °  °[facility]  (866) 331-1348    °[employer]  (888) 892-1162   ° °Agencies that provide inexpensive medical care: °Organization         Address  Phone   Notes  °[facility]  (336) 832-8035   °[facility]    (336) 832-7272   °[facility] [address] °[place], [place] [address] (336) 832-4777   °[facility] [address], °[place] (336) 271-4999   °[facility]    (336) 373-0678   °[facility]    (336) 272-1050   °[facility] ° [address], [place] Phone:  (336) 832-4444, Fax:  (336) 832-4440 Hours of Operation:  9 am - 6 pm, M-F.  Also accepts Medicaid/Medicare and self-pay.  °[facility] ° [address], [place] Phone: (336) 832-3150, Fax: (336) 832-3151. Hours of Operation:  8:30 am - 5:30 pm, M-F.  Also accepts Medicaid and self-pay.  °[facility] [place] [address], [place] Phone: (336) 878-6027   °[facility] [address], [place], [place] (336)723-1848, Ext. 123 Mondays & Thursdays: 7-9 AM.  First 15 patients are seen on a first come, first serve basis. °  ° °Medicaid-accepting [place] Providers: ° °Organization         Address  Phone   Notes  °[facility] [address], [place] (336) 641-2100 Also accepts self-pay patients.  °[facility] [address], [place] ° (336) 856-9996   °[facility] [address], [place] (336) 288-8857   °[facility] [address] [place] [address], [place] (336) 299-7000   °Veita Bland [address], [place]  ° (336) 373-1557 Only accepts [place] Access Medicaid patients after they have their name applied to their card.  ° °Self-Pay (no insurance) in [place]: ° °Organization         Address  Phone   Notes  °Sickle Cell Patients, [facility] [address],  [place] (336) 832-1970   ° [facility] [address], [place] (336) 832-4400   ° [facility] ° [address], [place] (336) 992-4800   °[facility]/Dr. Osei-Bonsu ° [address] [place] [address], [place] or [address], [place] (336) 841-8500 Phone number for both [place] and [place] locations is the same.  °[facility] [address], [place] (336) 299-0000   °[facility] [address] [place] [address],  or [address] (336) 852-7530 °(336) 878-2260   °[facility] [address],  (336) 350-1642, phone; (336) 294-5005, fax Sees patients 1st and 3rd Saturday of every month.  Must not qualify for public or private insurance (i.e. Medicaid, Medicare, [place] Health Choice, Veterans' Benefits) • Household income should be no more than 200% of the poverty level •The clinic cannot treat you if you are pregnant or think you are pregnant •   Sexually transmitted diseases are not treated at the clinic.  ° ° °Dental Care: °Organization         Address  Phone  Notes  °[facility] [address], [place] (336) 641-6152 Accepts children up to age 21 who are enrolled in Medicaid or [place] Health Choice; pregnant women with a Medicaid card; and children who have applied for Medicaid or [place] Health Choice, but were declined, whose parents can pay a reduced fee at time of service.  °[facility] [place]  [address], [place] (336) 641-7733 Accepts children up to age 21 who are enrolled in Medicaid or [place] Health Choice; pregnant women with a Medicaid card; and children who have applied for Medicaid or [place] Health Choice, but were declined, whose parents can pay a reduced fee at time of service.  °[facility] ° [address], [place] (336)  641-4533 Patients are seen by appointment only. Walk-ins are not accepted. [facility] will see patients 18 years of age and older. °Monday - Tuesday (8am-5pm) °Most Wednesdays (8:30-5pm) °$30 per visit, cash only  °[facility] ° [address], [place] (336) 641-4533 Patients are seen by appointment only. Walk-ins are not accepted. [facility] will see patients 18 years of age and older. °One Wednesday Evening (Monthly: Volunteer Based).  $30 per visit, cash only  °[facility]  (919) 537-3737 for adults; Children under age 4, call Graduate Pediatric Dentistry at ([phone]) [phone]. Children aged 4-14, please call (919) [phone] to request a pediatric application. ° Dental services are provided in all areas of dental care including fillings, crowns and bridges, complete and partial dentures, implants, gum treatment, root canals, and extractions. Preventive care is also provided. Treatment is provided to both adults and children. °Patients are selected via a lottery and there is often a waiting list. °  °[facility] [address], °[place] ° (336) 763-8833 [email or web address] °  °[facility] [address], [place], [place] (336)723-1848, Ext. 123 Second and Fourth Thursday of each month, opens at 6:30 AM; Clinic ends at 9 AM.  Patients are seen on a first-come first-served basis, and a limited number are seen during each clinic.  ° °[facility] ° [address], [place], [place] (336) 723-7904   Eligibility Requirements °You must have lived in [place], [place], or [place] counties for at least the last three months. °  You cannot be eligible for state or federal sponsored healthcare insurance, including Veterans Administration, Medicaid, or Medicare. °  You generally cannot be eligible for healthcare insurance through your employer.  °  How to apply: °Eligibility screenings are held every Tuesday and Wednesday afternoon  from 1:00 pm until 4:00 pm. You do not need an appointment for the interview!  °[facility] [address], [place], [place] 336-631-2330   °[facility]  336-342-8273   °[facility]  336-703-3100   °[facility]  336-570-6415   ° °Behavioral Health Resources in the Community: °Intensive Outpatient Programs °Organization         Address  Phone  Notes  °[facility] [address], [place], [place] 336-878-6098   °[facility] [address], [place], [place] 336-832-9800   °[facility] [address], [place], [place] °   336-882-2125   °[facility] [address],  °[place], [place] 1-800-853-5163 or 336-641-4981   °Substance Abuse Resources °Organization         Address  Phone  Notes  °[facility]  336-882-2125   °[facility]  336-784-9470   °[facility]  336-285-9073   °[facility]  336-845-3988   °Residential & Outpatient Substance Abuse Program  1-800-659-3381   °Psychological Services °Organization         Address  Phone  Notes  °[facility]  336- 832-9600   °[facility]  336- 378-7881   °[facility] [address], [place] 1-800-853-5163 or 336-641-4981   ° °Mobile Crisis Teams °Organization         Address  Phone  Notes  °[facility], Mobile Crisis Care Unit  1-877-626-1772   °Assertive °[facility] ° [address] [place], [place] 336-834-9664   °Sharon DeEsch [address] °[place] 336-554-5454   ° °Self-Help/Support Groups °Organization         Address  Phone             Notes  °[facility] - variety of support groups  336- 373-1402 Call for more information  °Narcotics Anonymous (NA), [facility] [address], °[place]  2 meetings at this location  ° °Residential Treatment  Programs °Organization         Address  Phone  Notes  °[facility] [address],    °[place]  1-866-801-8205   °[facility] ° [address], [place], [place] 704-293-8524   °[facility] [address], [place] 336-845-3988 Admissions: 8am-3pm M-F  °[facility] [address]    °[place], [place] 336-841-1104   °[facility] [address], [place], [place] 336-379-7146   °[facility] [address]  °[place], [place] 336-285-9073   °[facility] - Intensive Outpatient [address], [place], [place] 336-852-3033   °[facility] [address]  °[place], [place] 1-877-615-2722 or 336-784-9470   °[facility] [address] [place], [place] 336-227-7417 Accepts Medicaid  °[facility] [address]  °[place] 1-800-659-3381 Substance Abuse/Addiction Treatment  ° °[place] Behavioral Health Resources °Organization         Address  Phone  Notes  °[facility]  (888) 581-9988   °Julie Brannon, PhD [address] [place], [place]   (336) 349-5553 or (336) 951-0000   °[facility]   [address] °[place], [place] (336) 349-4454   °[facility] [address], [place], [place] (336) 342-8316 Insurance/Medicaid/sponsorship through [facility]  °[facility] [address]                                    [place], [place] (336) 342-8316 Therapy/tele-psych/case  °[facility] [address]  ° [place], [place] (336) 349-2233    °Dr. Arfeen  (336) 349-4544   °[facility] [place]  [employer] [place] [facility] 1) [address], [place] °2) [address], [place] °3)  [address], [place] (336) 349-3220 °(336) 342-7768 ° °(336) 342-8140   °[place] [facility] (336) 342-1394 or (336) 342-3537 (  After Hours)    ° ° ° °

## 2014-12-16 NOTE — ED Provider Notes (Signed)
CSN: [id number]     Arrival date & time 12/16/14  1955 History  This chart was scribed for non-physician practitioner, Fayrene Helper, PA-C working with Lorre Nick, MD by Angelene Giovanni, ED Scribe. The patient was seen in room TR05C/TR05C and the patient's care was started at 8:21 PM    Chief Complaint  Patient presents with  . Pain   The history is provided by the patient. No language interpreter was used.   HPI Comments: [name] [name] is a 74 y.o. female with a hx of chronic pain who presents to the Emergency Department complaining of an achy chronic lower back pain ongoing for years. Pain worsening in the past few days.  She denies any fever, chills or incontinence. She states that she takes care of her 20 yo son with a hx of seizures. She has to perform heavy lifting and caring for him which makes her chronic pain worsen.  She reports that she was set up with a pain specialist at the pain management clinic but she is afraid that they may not help her adequately. She adds that she wants a list of resources near her. She reports that she is currently on Coumadin. Her pain is similar to her chronic pain.    Past Medical History  Diagnosis Date  . Depression   . Schizophrenia   . Hyperlipidemia   . Hypertension   . Chronic pain     "over my whole body" (03/30/2013)  . 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 DVT (deep venous thrombosis)     per 01/17/10 d/c summary- "remote hx of dvt"?  . Iron deficiency anemia   . Glaucoma   . Hemorrhoids   . Asthma   . Anxiety   . GERD (gastroesophageal reflux disease)   . Psoriasis 04/29/2012    New onset evaluated by Dr Marylou Flesher, Dermatology, [facility] 6/13  Rx 0.1% Tacrolimus ointment  . Complication of anesthesia     "because I have sleep apnea" (03/30/2013)  . Heart murmur   . Chest pain, exertional   . Chronic bronchitis   . Sleep apnea     "waiting on my CPAP" (03/30/2013)  . Pneumonia     "a few times" (03/30/2013)   . Exertional shortness of breath     "& sometimes when laying down" (03/30/2013)  . Daily headache     "last 2 months" (03/30/2013)  . Arthritis     "joints" (03/30/2013)  . Varicose veins of legs   . Psoriasis    Past Surgical History  Procedure Laterality Date  . Spinal fusion      2004  . Carpal tunnel release Bilateral   . Tubal ligation  1973  . Dilation and curettage of uterus  1970's    "once" (03/30/2013)  . 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 Joint replacement    . Vein surgery  left leg   Family History  Problem Relation Age of Onset  . Diabetes Sister   . Colon cancer Brother 40  . Colon cancer Brother    History  Substance Use Topics  . Smoking status: Never Smoker   . Smokeless tobacco: Never Used  . Alcohol Use: No   OB History    No data available     Review of Systems  Constitutional: Negative for fever and chills.  Cardiovascular: Negative for chest pain.  Musculoskeletal: Positive for back pain. Negative for gait problem.  Hematological: Bruises/bleeds  easily.      Allergies  Other  Home Medications   Prior to Admission medications   Medication Sig Start Date End Date Taking? Authorizing Provider  acetaminophen (TYLENOL) 500 MG tablet Take 2 tablets (1,000 mg total) by mouth every 8 (eight) hours as needed. 11/22/14   Courtney Forcucci, PA-C  albuterol (PROVENTIL HFA;VENTOLIN HFA) 108 (90 BASE) MCG/ACT inhaler Inhale 2 puffs into the lungs every 6 (six) hours as needed for wheezing or shortness of breath (wheezing). 12/08/14   Tyrone Nine, MD  aspirin 325 MG tablet Take 650 mg by mouth 3 (three) times daily as needed for headache.    Historical Provider, MD  atenolol (TENORMIN) 100 MG tablet Take 100 mg by mouth daily.    Historical Provider, MD  beclomethasone (QVAR) 80 MCG/ACT inhaler Inhale 2 puffs into the lungs 2 (two) times daily as needed (wheezing).     Historical Provider, MD  cyclobenzaprine (FLEXERIL) 10 MG tablet Take 1 tablet (10  mg total) by mouth 3 (three) times daily as needed for muscle spasms. 12/08/14   Tyrone Nine, MD  FLUoxetine (PROZAC) 20 MG capsule Take 3 capsules (60 mg total) by mouth daily. 05/26/14   Tyrone Nine, MD  furosemide (LASIX) 20 MG tablet Take 1 tablet (20 mg total) by mouth daily. 10/29/14   Tyrone Nine, MD  gabapentin (NEURONTIN) 400 MG capsule Take 1 capsule (400 mg total) by mouth 3 (three) times daily. 12/08/14   Tyrone Nine, MD  hydrochlorothiazide (HYDRODIURIL) 25 MG tablet Take 25 mg by mouth daily.    Historical Provider, MD  hydroxypropyl methylcellulose / hypromellose (ISOPTO TEARS / GONIOVISC) 2.5 % ophthalmic solution Place 1 drop into both eyes 3 (three) times daily as needed for dry eyes.    Historical Provider, MD  hydrOXYzine (ATARAX/VISTARIL) 10 MG tablet Take 1 tablet (10 mg total) by mouth at bedtime as needed for itching (itching). 10/29/14   Tyrone Nine, MD  lisinopril (PRINIVIL,ZESTRIL) 20 MG tablet Take 1 tablet (20 mg total) by mouth daily. 12/08/14   Tyrone Nine, MD  meclizine (ANTIVERT) 25 MG tablet Take 25 mg by mouth 3 (three) times daily as needed for dizziness (dizziness).     Historical Provider, MD  methocarbamol (ROBAXIN) 500 MG tablet Take 1 tablet (500 mg total) by mouth 3 (three) times daily between meals as needed. 12/12/14   Rolland Porter, MD  omeprazole (PRILOSEC) 40 MG capsule Take 1 capsule (40 mg total) by mouth daily. Patient not taking: Reported on 11/18/2014 08/31/14   Arby Barrette, MD  silver sulfADIAZINE (SILVADENE) 1 % cream APPLY TOPICALLY EVERY DAY 12/10/14   Tyrone Nine, MD  tacrolimus (PROTOPIC) 0.1 % ointment Apply 1 application topically 3 (three) times daily as needed (for legs). Apply to affected areas daily PRN 03/01/14 03/01/15  Historical Provider, MD  traMADol (ULTRAM) 50 MG tablet Take 1 tablet (50 mg total) by mouth every 6 (six) hours as needed. 12/10/14   Hanna Patel-Mills, PA-C  triamcinolone cream (KENALOG) 0.1 % Apply 1 application topically 2  (two) times daily as needed (for legs). 10/29/14   Tyrone Nine, MD  warfarin (COUMADIN) 5 MG tablet TAKE 1 TABLET BY MOUTH EVERY DAY 10/18/14   Levert Feinstein, MD   BP 134/68 mmHg  Pulse 65  Temp(Src) 98.7 F (37.1 C) (Oral)  Resp 16  Ht  (1.651 m)  Wt 242 lb 4.8 oz (109.907 kg)  BMI 40.32 kg/m2  SpO2 95%  LMP 11/22/1996 Physical Exam  Constitutional: She is oriented to person, place, and time. She appears well-developed and well-nourished. No distress.  HENT:  Head: Normocephalic and atraumatic.  Eyes: Conjunctivae and EOM are normal.  Neck: Neck supple. No tracheal deviation present.  Cardiovascular: Normal rate.   Pulmonary/Chest: Effort normal. No respiratory distress.  Musculoskeletal: Normal range of motion.  Tenderness to lumbar and paraspinal lumbar region. Full ROM  Neurological: She is alert and oriented to person, place, and time.  She is able to ambulate  Skin: Skin is warm and dry.  Psychiatric: She has a normal mood and affect. Her behavior is normal.  Nursing note and vitals reviewed.   ED Course  Procedures (including critical care time) DIAGNOSTIC STUDIES: Oxygen Saturation is 95% on RA, adequate by my interpretation.    COORDINATION OF CARE: 8:26 PM- Pt with chronic back pain. Pt is well known to me.  i agree to offer a short course of pain management as i am aware of her worsening back pain from caring for her 72 year old mentally handicap son with recent seizures which i took care off. Pt will receive a list of resources around her area for continued care. Pt advised of plan for treatment and pt agrees.    Labs Review Labs Reviewed - No data to display  Imaging Review No results found.   EKG Interpretation None      MDM   Final diagnoses:  Chronic low back pain   BP 134/68 mmHg  Pulse 65  Temp(Src) 98.7 F (37.1 C) (Oral)  Resp 16  Ht 5\' 5"  (1.651 m)  Wt 242 lb 4.8 oz (109.907 kg)  BMI 40.32 kg/m2  SpO2 95%  LMP  11/22/1996   I personally performed the services described in this documentation, which was scribed in my presence. The recorded information has been reviewed and is accurate.     Fayrene HelperBowie Tyger Oka, PA-C 12/16/14 2103  Lorre NickAnthony Allen, MD 12/19/14 [phone]

## 2014-12-17 NOTE — Telephone Encounter (Signed)
I have read these notes and the 2 encounters in the ED in the past week. I have nothing further to offer.

## 2014-12-21 ENCOUNTER — Encounter: Payer: Self-pay | Admitting: Cardiology

## 2014-12-28 ENCOUNTER — Emergency Department (HOSPITAL_COMMUNITY)
Admission: EM | Admit: 2014-12-28 | Discharge: 2014-12-28 | Disposition: A | Discharge status: Home / Self Care | Payer: Medicare Other | Attending: Emergency Medicine | Admitting: Emergency Medicine

## 2014-12-28 ENCOUNTER — Encounter (HOSPITAL_COMMUNITY): Payer: Self-pay | Admitting: Emergency Medicine

## 2014-12-28 DIAGNOSIS — H409 Unspecified glaucoma: Secondary | ICD-10-CM | POA: Insufficient documentation

## 2014-12-28 DIAGNOSIS — K219 Gastro-esophageal reflux disease without esophagitis: Secondary | ICD-10-CM | POA: Insufficient documentation

## 2014-12-28 DIAGNOSIS — Z8701 Personal history of pneumonia (recurrent): Secondary | ICD-10-CM | POA: Diagnosis not present

## 2014-12-28 DIAGNOSIS — Z7982 Long term (current) use of aspirin: Secondary | ICD-10-CM | POA: Insufficient documentation

## 2014-12-28 DIAGNOSIS — Z86718 Personal history of other venous thrombosis and embolism: Secondary | ICD-10-CM | POA: Diagnosis not present

## 2014-12-28 DIAGNOSIS — Z79899 Other long term (current) drug therapy: Secondary | ICD-10-CM | POA: Diagnosis not present

## 2014-12-28 DIAGNOSIS — J45909 Unspecified asthma, uncomplicated: Secondary | ICD-10-CM | POA: Diagnosis not present

## 2014-12-28 DIAGNOSIS — Z86711 Personal history of pulmonary embolism: Secondary | ICD-10-CM | POA: Insufficient documentation

## 2014-12-28 DIAGNOSIS — M722 Plantar fascial fibromatosis: Secondary | ICD-10-CM

## 2014-12-28 DIAGNOSIS — E785 Hyperlipidemia, unspecified: Secondary | ICD-10-CM | POA: Diagnosis not present

## 2014-12-28 DIAGNOSIS — Z7901 Long term (current) use of anticoagulants: Secondary | ICD-10-CM | POA: Insufficient documentation

## 2014-12-28 DIAGNOSIS — M1990 Unspecified osteoarthritis, unspecified site: Secondary | ICD-10-CM | POA: Diagnosis not present

## 2014-12-28 DIAGNOSIS — Z862 Personal history of diseases of the blood and blood-forming organs and certain disorders involving the immune mechanism: Secondary | ICD-10-CM | POA: Diagnosis not present

## 2014-12-28 DIAGNOSIS — Z872 Personal history of diseases of the skin and subcutaneous tissue: Secondary | ICD-10-CM | POA: Diagnosis not present

## 2014-12-28 DIAGNOSIS — G8929 Other chronic pain: Secondary | ICD-10-CM | POA: Diagnosis not present

## 2014-12-28 DIAGNOSIS — R011 Cardiac murmur, unspecified: Secondary | ICD-10-CM | POA: Diagnosis not present

## 2014-12-28 DIAGNOSIS — I10 Essential (primary) hypertension: Secondary | ICD-10-CM | POA: Insufficient documentation

## 2014-12-28 DIAGNOSIS — R42 Dizziness and giddiness: Secondary | ICD-10-CM | POA: Insufficient documentation

## 2014-12-28 LAB — I-STAT CHEM 8, ED
BUN: 12 mg/dL (ref 6–23)
Calcium, Ion: 1.18 mmol/L (ref 1.13–1.30)
Chloride: 97 mmol/L (ref 96–112)
Creatinine, Ser: 1 mg/dL (ref 0.50–1.10)
Glucose, Bld: 91 mg/dL (ref 70–99)
HEMATOCRIT: 34 % — AB (ref 36.0–46.0)
HEMOGLOBIN: 11.6 g/dL — AB (ref 12.0–15.0)
Potassium: 3.8 mmol/L (ref 3.5–5.1)
SODIUM: 139 mmol/L (ref 135–145)
TCO2: 28 mmol/L (ref 0–100)

## 2014-12-28 MED ORDER — DIAZEPAM 2 MG PO TABS: 2.0000 mg | ORAL_TABLET | Freq: Two times a day (BID) | ORAL | Status: DC | PRN

## 2014-12-28 MED ORDER — DIAZEPAM 2 MG PO TABS
2.0000 mg | ORAL_TABLET | Freq: Once | ORAL | Status: AC
  Administered 2014-12-28: 2 mg via ORAL
  Filled 2014-12-28: qty 1

## 2014-12-28 NOTE — ED Notes (Signed)
Pt verbalized understanding of d/c instructions and has no further questions.  

## 2014-12-28 NOTE — ED Notes (Signed)
Pt to ED with c/o vertigo.  St's she has had vertigo x's 2 months and can usually control it with Antivert but is not helping today.

## 2014-12-28 NOTE — ED Provider Notes (Signed)
CSN: [id number]     Arrival date & time 12/28/14  1735 History   First MD Initiated Contact with Patient 12/28/14 1845     Chief Complaint  Patient presents with  . Dizziness     (Consider location/radiation/quality/duration/timing/severity/associated sxs/prior Treatment) HPI Comments: Patient presents to the ER for evaluation of dizziness. Patient reports that she has a history of vertigo. She reports that she usually gets a flare about once a year. She has successfully treated it with Antivert in the past. She reports that she has been having intermittent symptoms of spinning when she moves her head for the last 2 months. She was seen in the ER 2 weeks ago for similar. Her workup was negative. She reports that she has fallen because of the dizziness. There is no headache or vision change. No numbness, tingling or weakness in extremities. No speech disturbance. She does not have neck pain.  Patient is a 74 y.o. female presenting with dizziness.  Dizziness   Past Medical History  Diagnosis Date  . Depression   . Schizophrenia   . Hyperlipidemia   . Hypertension   . Chronic pain     "over my whole body" (03/30/2013)  . 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 DVT (deep venous thrombosis)     per 01/17/10 d/c summary- "remote hx of dvt"?  . Iron deficiency anemia   . Glaucoma   . Hemorrhoids   . Asthma   . Anxiety   . GERD (gastroesophageal reflux disease)   . Psoriasis 04/29/2012    New onset evaluated by Dr Marylou Flesher, Dermatology, [facility] 6/13  Rx 0.1% Tacrolimus ointment  . Complication of anesthesia     "because I have sleep apnea" (03/30/2013)  . Heart murmur   . Chest pain, exertional   . Chronic bronchitis   . Sleep apnea     "waiting on my CPAP" (03/30/2013)  . Pneumonia     "a few times" (03/30/2013)  . Exertional shortness of breath     "& sometimes when laying down" (03/30/2013)  . Daily headache     "last 2 months" (03/30/2013)  . Arthritis      "joints" (03/30/2013)  . Varicose veins of legs   . Psoriasis    Past Surgical History  Procedure Laterality Date  . Spinal fusion      2004  . Carpal tunnel release Bilateral   . Tubal ligation  1973  . Dilation and curettage of uterus  1970's    "once" (03/30/2013)  . 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 Joint replacement    . Vein surgery  left leg   Family History  Problem Relation Age of Onset  . Diabetes Sister   . Colon cancer Brother 40  . Colon cancer Brother    History  Substance Use Topics  . Smoking status: Never Smoker   . Smokeless tobacco: Never Used  . Alcohol Use: No   OB History    No data available     Review of Systems  Neurological: Positive for dizziness.  All other systems reviewed and are negative.     Allergies  Other  Home Medications   Prior to Admission medications   Medication Sig Start Date End Date Taking? Authorizing Provider  acetaminophen (TYLENOL) 500 MG tablet Take 2 tablets (1,000 mg total) by mouth every 8 (eight) hours as needed. 11/22/14  Yes Courtney Forcucci, PA-C  albuterol (PROVENTIL HFA;VENTOLIN HFA) 108 (90  BASE) MCG/ACT inhaler Inhale 2 puffs into the lungs every 6 (six) hours as needed for wheezing or shortness of breath (wheezing). 12/08/14  Yes Tyrone Nineyan B Grunz, MD  aspirin 325 MG tablet Take 650 mg by mouth 3 (three) times daily as needed for headache.   Yes Historical Provider, MD  atenolol (TENORMIN) 100 MG tablet Take 100 mg by mouth daily.   Yes Historical Provider, MD  atorvastatin (LIPITOR) 40 MG tablet Take 40 mg by mouth daily.   Yes Historical Provider, MD  beclomethasone (QVAR) 80 MCG/ACT inhaler Inhale 2 puffs into the lungs 2 (two) times daily as needed (wheezing).    Yes Historical Provider, MD  cyclobenzaprine (FLEXERIL) 10 MG tablet Take 1 tablet (10 mg total) by mouth 3 (three) times daily as needed for muscle spasms. 12/08/14  Yes Tyrone Nineyan B Grunz, MD  FLUoxetine (PROZAC) 20 MG capsule Take 3 capsules (60 mg  total) by mouth daily. 05/26/14  Yes Tyrone Nineyan B Grunz, MD  furosemide (LASIX) 20 MG tablet Take 1 tablet (20 mg total) by mouth daily. 10/29/14  Yes Tyrone Nineyan B Grunz, MD  gabapentin (NEURONTIN) 400 MG capsule Take 1 capsule (400 mg total) by mouth 3 (three) times daily. 12/08/14  Yes Tyrone Nineyan B Grunz, MD  hydrochlorothiazide (HYDRODIURIL) 25 MG tablet Take 25 mg by mouth daily.   Yes Historical Provider, MD  hydroxypropyl methylcellulose / hypromellose (ISOPTO TEARS / GONIOVISC) 2.5 % ophthalmic solution Place 1 drop into both eyes 3 (three) times daily as needed for dry eyes.   Yes Historical Provider, MD  hydrOXYzine (ATARAX/VISTARIL) 10 MG tablet Take 1 tablet (10 mg total) by mouth at bedtime as needed for itching (itching). 10/29/14  Yes Tyrone Nineyan B Grunz, MD  lisinopril (PRINIVIL,ZESTRIL) 20 MG tablet Take 1 tablet (20 mg total) by mouth daily. 12/08/14  Yes Tyrone Nineyan B Grunz, MD  meclizine (ANTIVERT) 25 MG tablet Take 25 mg by mouth 3 (three) times daily as needed for dizziness (dizziness).    Yes Historical Provider, MD  methocarbamol (ROBAXIN) 500 MG tablet Take 1 tablet (500 mg total) by mouth 3 (three) times daily between meals as needed. Patient taking differently: Take 500 mg by mouth 3 (three) times daily between meals as needed for muscle spasms.  12/12/14  Yes Rolland PorterMark James, MD  silver sulfADIAZINE (SILVADENE) 1 % cream APPLY TOPICALLY EVERY DAY 12/10/14  Yes Tyrone Nineyan B Grunz, MD  tacrolimus (PROTOPIC) 0.1 % ointment Apply 1 application topically 3 (three) times daily as needed (for legs). Apply to affected areas daily PRN 03/01/14 03/01/15 Yes Historical Provider, MD  traMADol (ULTRAM) 50 MG tablet Take 1 tablet (50 mg total) by mouth every 6 (six) hours as needed. Patient taking differently: Take 50 mg by mouth every 6 (six) hours as needed for moderate pain.  12/16/14  Yes Fayrene HelperBowie Tran, PA-C  triamcinolone cream (KENALOG) 0.1 % Apply 1 application topically 2 (two) times daily as needed (for legs). 10/29/14  Yes Tyrone Nineyan B Grunz, MD   warfarin (COUMADIN) 5 MG tablet TAKE 1 TABLET BY MOUTH EVERY DAY 10/18/14  Yes Levert FeinsteinJames M Granfortuna, MD  omeprazole (PRILOSEC) 40 MG capsule Take 1 capsule (40 mg total) by mouth daily. Patient not taking: Reported on 11/18/2014 08/31/14   Arby BarretteMarcy Pfeiffer, MD   BP 146/86 mmHg  Pulse 73  Temp(Src) 98.4 F (36.9 C) (Oral)  Resp 18  SpO2 94%  LMP 11/22/1996 Physical Exam  Constitutional: She is oriented to person, place, and time. She appears well-developed and well-nourished. No distress.  HENT:  Head: Normocephalic and atraumatic.  Right Ear: Hearing normal.  Left Ear: Hearing normal.  Nose: Nose normal.  Mouth/Throat: Oropharynx is clear and moist and mucous membranes are normal.  Eyes: Conjunctivae and EOM are normal. Pupils are equal, round, and reactive to light.  Neck: Normal range of motion. Neck supple.  Cardiovascular: Regular rhythm, S1 normal and S2 normal.  Exam reveals no gallop and no friction rub.   No murmur heard. Pulmonary/Chest: Effort normal and breath sounds normal. No respiratory distress. She exhibits no tenderness.  Abdominal: Soft. Normal appearance and bowel sounds are normal. There is no hepatosplenomegaly. There is no tenderness. There is no rebound, no guarding, no tenderness at McBurney's point and negative Murphy's sign. No hernia.  Musculoskeletal: Normal range of motion.  Neurological: She is alert and oriented to person, place, and time. She has normal strength. No cranial nerve deficit or sensory deficit. Coordination normal. GCS eye subscore is 4. GCS verbal subscore is 5. GCS motor subscore is 6.  Extraocular muscle movement: normal No visual field cut Pupils: equal and reactive both direct and consensual response is normal No nystagmus present    Sensory function is intact to light touch, pinprick Proprioception intact  Grip strength 5/5 symmetric in upper extremities No pronator drift   Lower extremity strength 5/5 against gravity   Gait:  normal   Skin: Skin is warm, dry and intact. No rash noted. No cyanosis.  Psychiatric: She has a normal mood and affect. Her speech is normal and behavior is normal. Thought content normal.  Nursing note and vitals reviewed.   ED Course  Procedures (including critical care time) Labs Review Labs Reviewed - No data to display  Imaging Review No results found.   EKG Interpretation None      MDM   Final diagnoses:  None  vertigo  Patient presents to the ER for evaluation of vertigo. Patient has had vertigo diagnosed previously. This has been an ongoing issue for her. She was seen in the ER several weeks ago and had a CT scan that was unremarkable. Symptoms have not changed. She has no neurologic deficits on examination. Dizziness is secondary to position changes of the head. This is very consistent with peripheral vertigo, no concern for central vertigo at this time. Basic labs are unremarkable. Patient administered Valium for symptomatically, follow-up with her doctor.    Gilda Crease, MD 12/28/14 2150

## 2014-12-28 NOTE — Discharge Instructions (Signed)
Vertigo Vertigo means you feel like you or your surroundings are moving when they are not. Vertigo can be dangerous if it occurs when you are at work, driving, or performing difficult activities.  CAUSES  Vertigo occurs when there is a conflict of signals sent to your brain from the visual and sensory systems in your body. There are many different causes of vertigo, including:  Infections, especially in the inner ear.  A bad reaction to a drug or misuse of alcohol and medicines.  Withdrawal from drugs or alcohol.  Rapidly changing positions, such as lying down or rolling over in bed.  A migraine headache.  Decreased blood flow to the brain.  Increased pressure in the brain from a head injury, infection, tumor, or bleeding. SYMPTOMS  You may feel as though the world is spinning around or you are falling to the ground. Because your balance is upset, vertigo can cause nausea and vomiting. You may have involuntary eye movements (nystagmus). DIAGNOSIS  Vertigo is usually diagnosed by physical exam. If the cause of your vertigo is unknown, your caregiver may perform imaging tests, such as an MRI scan (magnetic resonance imaging). TREATMENT  Most cases of vertigo resolve on their own, without treatment. Depending on the cause, your caregiver may prescribe certain medicines. If your vertigo is related to body position issues, your caregiver may recommend movements or procedures to correct the problem. In rare cases, if your vertigo is caused by certain inner ear problems, you may need surgery. HOME CARE INSTRUCTIONS   Follow your caregiver's instructions.  Avoid driving.  Avoid operating heavy machinery.  Avoid performing any tasks that would be dangerous to you or others during a vertigo episode.  Tell your caregiver if you notice that certain medicines seem to be causing your vertigo. Some of the medicines used to treat vertigo episodes can actually make them worse in some people. SEEK  IMMEDIATE MEDICAL CARE IF:   Your medicines do not relieve your vertigo or are making it worse.  You develop problems with talking, walking, weakness, or using your arms, hands, or legs.  You develop severe headaches.  Your nausea or vomiting continues or gets worse.  You develop visual changes.  A family member notices behavioral changes.  Your condition gets worse. MAKE SURE YOU:  Understand these instructions.  Will watch your condition.  Will get help right away if you are not doing well or get worse. Document Released: 06/20/2005 Document Revised: 12/03/2011 Document Reviewed: 03/29/2011 [facility] Patient Information 2015 [place], [place]. This information is not intended to replace advice given to you by your health care provider. Make sure you discuss any questions you have with your health care provider.  Plantar Fasciitis Plantar fasciitis is a common condition that causes foot pain. It is soreness (inflammation) of the band of tough fibrous tissue on the bottom of the foot that runs from the heel bone (calcaneus) to the ball of the foot. The cause of this soreness may be from excessive standing, poor fitting shoes, running on hard surfaces, being overweight, having an abnormal walk, or overuse (this is common in runners) of the painful foot or feet. It is also common in aerobic exercise dancers and ballet dancers. SYMPTOMS  Most people with plantar fasciitis complain of:  Severe pain in the morning on the bottom of their foot especially when taking the first steps out of bed. This pain recedes after a few minutes of walking.  Severe pain is experienced also during walking following a  long period of inactivity.  Pain is worse when walking barefoot or up stairs DIAGNOSIS   Your caregiver will diagnose this condition by examining and feeling your foot.  Special tests such as X-rays of your foot, are usually not needed. PREVENTION   Consult a sports medicine professional  before beginning a new exercise program.  Walking programs offer a good workout. With walking there is a lower chance of overuse injuries common to runners. There is less impact and less jarring of the joints.  Begin all new exercise programs slowly. If problems or pain develop, decrease the amount of time or distance until you are at a comfortable level.  Wear good shoes and replace them regularly.  Stretch your foot and the heel cords at the back of the ankle (Achilles tendon) both before and after exercise.  Run or exercise on even surfaces that are not hard. For example, asphalt is better than pavement.  Do not run barefoot on hard surfaces.  If using a treadmill, vary the incline.  Do not continue to workout if you have foot or joint problems. Seek professional help if they do not improve. HOME CARE INSTRUCTIONS   Avoid activities that cause you pain until you recover.  Use ice or cold packs on the problem or painful areas after working out.  Only take over-the-counter or prescription medicines for pain, discomfort, or fever as directed by your caregiver.  Soft shoe inserts or athletic shoes with air or gel sole cushions may be helpful.  If problems continue or become more severe, consult a sports medicine caregiver or your own health care provider. Cortisone is a potent anti-inflammatory medication that may be injected into the painful area. You can discuss this treatment with your caregiver. MAKE SURE YOU:   Understand these instructions.  Will watch your condition.  Will get help right away if you are not doing well or get worse. Document Released: 06/05/2001 Document Revised: 12/03/2011 Document Reviewed: 08/04/2008 [facility] Patient Information 2015 [place]. This information is not intended to replace advice given to you by your health care provider. Make sure you discuss any questions you have with your health care provider.

## 2015-01-05 ENCOUNTER — Other Ambulatory Visit: Payer: Self-pay | Admitting: Family Medicine

## 2015-01-15 ENCOUNTER — Encounter (HOSPITAL_COMMUNITY): Payer: Self-pay

## 2015-01-15 ENCOUNTER — Emergency Department (HOSPITAL_COMMUNITY)
Admission: EM | Admit: 2015-01-15 | Discharge: 2015-01-15 | Disposition: A | Discharge status: Home / Self Care | Payer: Medicare Other | Attending: Emergency Medicine | Admitting: Emergency Medicine

## 2015-01-15 DIAGNOSIS — I10 Essential (primary) hypertension: Secondary | ICD-10-CM | POA: Insufficient documentation

## 2015-01-15 DIAGNOSIS — K219 Gastro-esophageal reflux disease without esophagitis: Secondary | ICD-10-CM | POA: Diagnosis not present

## 2015-01-15 DIAGNOSIS — M549 Dorsalgia, unspecified: Secondary | ICD-10-CM

## 2015-01-15 DIAGNOSIS — Z872 Personal history of diseases of the skin and subcutaneous tissue: Secondary | ICD-10-CM | POA: Insufficient documentation

## 2015-01-15 DIAGNOSIS — J45909 Unspecified asthma, uncomplicated: Secondary | ICD-10-CM | POA: Diagnosis not present

## 2015-01-15 DIAGNOSIS — Z862 Personal history of diseases of the blood and blood-forming organs and certain disorders involving the immune mechanism: Secondary | ICD-10-CM | POA: Diagnosis not present

## 2015-01-15 DIAGNOSIS — Z7982 Long term (current) use of aspirin: Secondary | ICD-10-CM | POA: Diagnosis not present

## 2015-01-15 DIAGNOSIS — M1990 Unspecified osteoarthritis, unspecified site: Secondary | ICD-10-CM | POA: Diagnosis not present

## 2015-01-15 DIAGNOSIS — Z79899 Other long term (current) drug therapy: Secondary | ICD-10-CM | POA: Diagnosis not present

## 2015-01-15 DIAGNOSIS — R011 Cardiac murmur, unspecified: Secondary | ICD-10-CM | POA: Insufficient documentation

## 2015-01-15 DIAGNOSIS — Z86711 Personal history of pulmonary embolism: Secondary | ICD-10-CM | POA: Insufficient documentation

## 2015-01-15 DIAGNOSIS — F329 Major depressive disorder, single episode, unspecified: Secondary | ICD-10-CM | POA: Insufficient documentation

## 2015-01-15 DIAGNOSIS — G8929 Other chronic pain: Secondary | ICD-10-CM

## 2015-01-15 DIAGNOSIS — F419 Anxiety disorder, unspecified: Secondary | ICD-10-CM | POA: Insufficient documentation

## 2015-01-15 DIAGNOSIS — Z86718 Personal history of other venous thrombosis and embolism: Secondary | ICD-10-CM | POA: Diagnosis not present

## 2015-01-15 DIAGNOSIS — M545 Low back pain: Secondary | ICD-10-CM | POA: Diagnosis not present

## 2015-01-15 DIAGNOSIS — J189 Pneumonia, unspecified organism: Secondary | ICD-10-CM | POA: Insufficient documentation

## 2015-01-15 DIAGNOSIS — E785 Hyperlipidemia, unspecified: Secondary | ICD-10-CM | POA: Insufficient documentation

## 2015-01-15 DIAGNOSIS — M722 Plantar fascial fibromatosis: Secondary | ICD-10-CM

## 2015-01-15 DIAGNOSIS — Z7901 Long term (current) use of anticoagulants: Secondary | ICD-10-CM | POA: Diagnosis not present

## 2015-01-15 MED ORDER — NAPROXEN 250 MG PO TABS
250.0000 mg | ORAL_TABLET | Freq: Once | ORAL | Status: AC
  Administered 2015-01-15: 250 mg via ORAL
  Filled 2015-01-15: qty 1

## 2015-01-15 NOTE — ED Provider Notes (Signed)
CSN: [id number]     Arrival date & time 01/15/15  1437 History  This chart was scribed for [employer], PA-C, working with Gwyneth Sprout, MD by Chestine Spore, ED Scribe. The patient was seen in room TR11C/TR11C at 4:23 PM.    Chief Complaint  Patient presents with  . Foot Pain  . Back Pain      Patient is a 74 y.o. female presenting with back pain. The history is provided by the patient. No language interpreter was used.  Back Pain Location:  Lumbar spine Quality:  Aching Radiates to: bilateral thighs. Pain severity:  Moderate Pain is:  Same all the time Onset quality:  Gradual Duration:  1 month Timing:  Constant Progression:  Waxing and waning Chronicity:  Chronic Context: lifting heavy objects   Context comment:  And with walking Relieved by:  Bed rest Worsened by:  Ambulation and movement Ineffective treatments:  None tried Associated symptoms: no abdominal pain, no bladder incontinence, no bowel incontinence, no chest pain, no dysuria, no fever, no numbness, no paresthesias, no perianal numbness, no tingling and no weakness   Risk factors: obesity   Risk factors: no recent surgery    HPI Comments: [name] [name] is a 74 y.o. female with a past medical hx of HTN, DVT, spinal fusion surgery, and heel spurs, who presents to the Emergency Department complaining of back pain onset 1 month. Pt reports that she is here today because she was in the hospital for her son and she has been walking so much that she aggravated her foot and back. Her pain is 10/10 and is constant and sometimes waxing and waning. She notes that elevations and resting helps with her symptoms. She reports that the back pain radiates into her legs sometimes. She reports that walking makes her pain worse. Pt does not have a PCP and she is trying to find one. Pt also did a lot of packing within the last month. Pt reports that she has tried gabapentin with no relief for her symptoms. She denies fevers,  CP, SOB, abdominal pain, n/v/d, dysuria, hematuria, flank pain, constipation, numbness, tingling, weakness, saddle anesthesia, or cauda equina symptoms. Pain is throbbing in her feet. She reports that her back is hurting where she had a spinal fusion. Pt reports that she has heel spurs and that she used to get injections for that when she was in [place] but she doesn't anymore. She denies color change or warmth of her feet.   Past Medical History  Diagnosis Date  . Depression   . Schizophrenia   . Hyperlipidemia   . Hypertension   . Chronic pain     "over my whole body" (03/30/2013)  . 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 DVT (deep venous thrombosis)     per 01/17/10 d/c summary- "remote hx of dvt"?  . Iron deficiency anemia   . Glaucoma   . Hemorrhoids   . Asthma   . Anxiety   . GERD (gastroesophageal reflux disease)   . Psoriasis 04/29/2012    New onset evaluated by Dr Marylou Flesher, Dermatology, [facility] 6/13  Rx 0.1% Tacrolimus ointment  . Complication of anesthesia     "because I have sleep apnea" (03/30/2013)  . Heart murmur   . Chest pain, exertional   . Chronic bronchitis   . Sleep apnea     "waiting on my CPAP" (03/30/2013)  . Pneumonia     "a few times" (03/30/2013)  .  Exertional shortness of breath     "& sometimes when laying down" (03/30/2013)  . Daily headache     "last 2 months" (03/30/2013)  . Arthritis     "joints" (03/30/2013)  . Varicose veins of legs   . Psoriasis    Past Surgical History  Procedure Laterality Date  . Spinal fusion      2004  . Carpal tunnel release Bilateral   . Tubal ligation  1973  . Dilation and curettage of uterus  1970's    "once" (03/30/2013)  . 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 Joint replacement    . Vein surgery  left leg   Family History  Problem Relation Age of Onset  . Diabetes Sister   . Colon cancer Brother 40  . Colon cancer Brother    History  Substance Use Topics  . Smoking status: Never  Smoker   . Smokeless tobacco: Never Used  . Alcohol Use: No   OB History    No data available     Review of Systems  Constitutional: Negative for fever and chills.  Respiratory: Negative for shortness of breath.   Cardiovascular: Negative for chest pain.  Gastrointestinal: Negative for nausea, vomiting, abdominal pain, diarrhea, constipation and bowel incontinence.  Genitourinary: Negative for bladder incontinence, dysuria and hematuria.  Musculoskeletal: Positive for back pain and arthralgias. Negative for myalgias and joint swelling.  Skin: Negative for color change.  Allergic/Immunologic: Negative for immunocompromised state.  Neurological: Negative for tingling, weakness, numbness and paresthesias.  Psychiatric/Behavioral: Negative for confusion.  10 Systems reviewed and are negative for acute change except as noted in the HPI.     Allergies  Other  Home Medications   Prior to Admission medications   Medication Sig Start Date End Date Taking? Authorizing Provider  acetaminophen (TYLENOL) 500 MG tablet Take 2 tablets (1,000 mg total) by mouth every 8 (eight) hours as needed. 11/22/14   Courtney Forcucci, PA-C  albuterol (PROVENTIL HFA;VENTOLIN HFA) 108 (90 BASE) MCG/ACT inhaler Inhale 2 puffs into the lungs every 6 (six) hours as needed for wheezing or shortness of breath (wheezing). 12/08/14   Tyrone Nine, MD  aspirin 325 MG tablet Take 650 mg by mouth 3 (three) times daily as needed for headache.    Historical Provider, MD  atenolol (TENORMIN) 100 MG tablet Take 100 mg by mouth daily.    Historical Provider, MD  atorvastatin (LIPITOR) 40 MG tablet Take 40 mg by mouth daily.    Historical Provider, MD  beclomethasone (QVAR) 80 MCG/ACT inhaler Inhale 2 puffs into the lungs 2 (two) times daily as needed (wheezing).     Historical Provider, MD  cyclobenzaprine (FLEXERIL) 10 MG tablet Take 1 tablet (10 mg total) by mouth 3 (three) times daily as needed for muscle spasms. 12/08/14    Tyrone Nine, MD  diazepam (VALIUM) 2 MG tablet Take 1 tablet (2 mg total) by mouth every 12 (twelve) hours as needed (vertigo). 12/28/14   Gilda Crease, MD  FLUoxetine (PROZAC) 20 MG capsule Take 3 capsules (60 mg total) by mouth daily. 05/26/14   Tyrone Nine, MD  furosemide (LASIX) 20 MG tablet Take 1 tablet (20 mg total) by mouth daily. 10/29/14   Tyrone Nine, MD  gabapentin (NEURONTIN) 400 MG capsule Take 1 capsule (400 mg total) by mouth 3 (three) times daily. 12/08/14   Tyrone Nine, MD  hydrochlorothiazide (HYDRODIURIL) 25 MG tablet Take 25 mg by mouth daily.    Historical  Provider, MD  hydroxypropyl methylcellulose / hypromellose (ISOPTO TEARS / GONIOVISC) 2.5 % ophthalmic solution Place 1 drop into both eyes 3 (three) times daily as needed for dry eyes.    Historical Provider, MD  hydrOXYzine (ATARAX/VISTARIL) 10 MG tablet Take 1 tablet (10 mg total) by mouth at bedtime as needed for itching (itching). 10/29/14   Tyrone Nine, MD  lisinopril (PRINIVIL,ZESTRIL) 20 MG tablet Take 1 tablet (20 mg total) by mouth daily. 12/08/14   Tyrone Nine, MD  meclizine (ANTIVERT) 25 MG tablet TAKE 1 TABLET BY MOUTH 3 TIMES A DAY AS NEEDED FOR DIZZINESS AND VERTIGO (TOO EARLY 07/09/14) 01/05/15   Tyrone Nine, MD  methocarbamol (ROBAXIN) 500 MG tablet Take 1 tablet (500 mg total) by mouth 3 (three) times daily between meals as needed. Patient taking differently: Take 500 mg by mouth 3 (three) times daily between meals as needed for muscle spasms.  12/12/14   Rolland Porter, MD  omeprazole (PRILOSEC) 40 MG capsule Take 1 capsule (40 mg total) by mouth daily. Patient not taking: Reported on 11/18/2014 08/31/14   Arby Barrette, MD  silver sulfADIAZINE (SILVADENE) 1 % cream APPLY TOPICALLY EVERY DAY 12/10/14   Tyrone Nine, MD  tacrolimus (PROTOPIC) 0.1 % ointment Apply 1 application topically 3 (three) times daily as needed (for legs). Apply to affected areas daily PRN 03/01/14 03/01/15  Historical Provider, MD  traMADol  (ULTRAM) 50 MG tablet Take 1 tablet (50 mg total) by mouth every 6 (six) hours as needed. Patient taking differently: Take 50 mg by mouth every 6 (six) hours as needed for moderate pain.  12/16/14   Fayrene Helper, PA-C  triamcinolone cream (KENALOG) 0.1 % Apply 1 application topically 2 (two) times daily as needed (for legs). 10/29/14   Tyrone Nine, MD  warfarin (COUMADIN) 5 MG tablet TAKE 1 TABLET BY MOUTH EVERY DAY 10/18/14   Levert Feinstein, MD   BP 127/87 mmHg  Pulse 63  Temp(Src) 98.1 F (36.7 C) (Oral)  Resp 20  Ht  (1.651 m)  Wt 240 lb (108.863 kg)  BMI 39.94 kg/m2  SpO2 98%  LMP 11/22/1996  Physical Exam  Constitutional: She is oriented to person, place, and time. Vital signs are normal. She appears well-developed and well-nourished.  Non-toxic appearance. No distress.  Afebrile, nontoxic, NAD  HENT:  Head: Normocephalic and atraumatic.  Mouth/Throat: Mucous membranes are normal.  Eyes: Conjunctivae and EOM are normal. Right eye exhibits no discharge. Left eye exhibits no discharge.  Neck: Normal range of motion. Neck supple.  Cardiovascular: Normal rate and intact distal pulses.   Pulmonary/Chest: Effort normal. No respiratory distress.  Abdominal: Normal appearance. She exhibits no distension.  Musculoskeletal: Normal range of motion.       Lumbar back: She exhibits tenderness and spasm. She exhibits normal range of motion, no bony tenderness and no deformity.  Lumbar spine with FROM intact without spinous process TTP, no bony stepoffs or deformities, bilateral paraspinous muscle TTP and muscle spasms. Strength 5/5 in all extremities, sensation grossly intact in all extremities. No overlying skin changes.  Bilateral heels with mild TTP at the insertion of the plantar fascia with no swelling or erythema. dp intact.  Neurological: She is alert and oriented to person, place, and time. She has normal strength. No sensory deficit.  Skin: Skin is warm, dry and intact. No rash  noted.  Psychiatric: She has a normal mood and affect. Her behavior is normal.  Nursing note and vitals  reviewed.   ED Course  Procedures (including critical care time) DIAGNOSTIC STUDIES: Oxygen Saturation is 98% on RA, nl by my interpretation.    COORDINATION OF CARE: 4:31 PM-Discussed treatment plan which includes alternate tylenol and motrin, warm compresses, and referral to podiatrist, with pt at bedside and pt agreed to plan.   Labs Review Labs Reviewed - No data to display  Imaging Review No results found.   EKG Interpretation None      MDM   Final diagnoses:  Chronic back pain  Plantar fasciitis, bilateral    74 y.o. female here with chronic pain in b/l feet and back. Reports it's flared up due to recent heavy lifting and walking. Needs a PCP referral. Denies changes in symptoms today, no recent trauma or falls. No red flag s/s of low back pain. No s/s of central cord compression or cauda equina. Lower extremities are neurovascularly intact and patient is ambulating without difficulty. Tenderness is in b/l paraspinous muscles in lumbar region, and in heel spur region bilaterally. Pt with known heel spurs. Doubt need for imaging.  Patient was counseled on back pain precautions and told to do activity as tolerated but do not lift, push, or pull heavy objects more than 10 pounds for the next week. Patient counseled to use ice or heat on back for no longer than 15 minutes every hour. Also discussed ice water bottle for stretches to feet. Will give referral to podiatry. Discussed calling her insurance company for referral to PCP, or using the resource guide she was given the last time.  Patient urged to follow-up with PCP if pain does not improve with treatment and rest or if pain becomes recurrent. Urged to return with worsening severe pain, loss of bowel or bladder control, trouble walking. The patient verbalizes understanding and agrees with the plan.   I personally  performed the services described in this documentation, which was scribed in my presence. The recorded information has been reviewed and is accurate.  BP 127/87 mmHg  Pulse 63  Temp(Src) 98.1 F (36.7 C) (Oral)  Resp 20  Ht 5\' 5"  (1.651 m)  Wt 240 lb (108.863 kg)  BMI 39.94 kg/m2  SpO2 98%  LMP 11/22/1996  Meds ordered this encounter  Medications  . naproxen (NAPROSYN) tablet 250 mg    Sig:      Bula Cavalieri Camprubi-Soms, PA-C 01/15/15 1731  Gwyneth SproutWhitney Plunkett, MD 01/16/15 0009

## 2015-01-15 NOTE — ED Notes (Signed)
Declined W/C at D/C and was escorted to lobby by RN. 

## 2015-01-15 NOTE — ED Notes (Signed)
Pt here with visitor. Started having pain in both feet from her heel spurs and back pain.

## 2015-01-15 NOTE — Discharge Instructions (Signed)
Back Pain:  Your back pain should be treated with medicines such as ibuprofen or aleve and this back pain should get better over the next 2 weeks.  However if you develop severe or worsening pain, low back pain with fever, numbness, weakness or inability to walk or urinate, you should return to the ER immediately.  Please follow up with your doctor this week for a recheck if still having symptoms.  Low back pain is discomfort in the lower back that may be due to injuries to muscles and ligaments around the spine.  Occasionally, it may be caused by a a problem to a part of the spine called a disc.  The pain may last several days or a week;  However, most patients get completely well in 4 weeks.  Self - care:  The application of heat can help soothe the pain.  Maintaining your daily activities, including walking, is encourged, as it will help you get better faster than just staying in bed. Perform gentle stretching as discussed. Drink plenty of fluids.  Medications are also useful to help with pain control.  A commonly prescribed medication includes tylenol.  Non steroidal anti inflammatory medications including Ibuprofen and naproxen;  These medications help both pain and swelling and are very useful in treating back pain.  They should be taken with food, as they can cause stomach upset, and more seriously, stomach bleeding.    SEEK IMMEDIATE MEDICAL ATTENTION IF: New numbness, tingling, weakness, or problem with the use of your arms or legs.  Severe back pain not relieved with medications.  Difficulty with or loss of control of your bowel or bladder control.  Increasing pain in any areas of the body (such as chest or abdominal pain).  Shortness of breath, dizziness or fainting.  Nausea (feeling sick to your stomach), vomiting, fever, or sweats.  You will need to follow up with  Your primary healthcare provider in 1-2 weeks for reassessment.  For your foot pain, use a frozen water bottle and  roll your feet on it for 20 minutes 4 times per day.   Chronic Back Pain  When back pain lasts longer than 3 months, it is called chronic back pain.People with chronic back pain often go through certain periods that are more intense (flare-ups).  CAUSES Chronic back pain can be caused by wear and tear (degeneration) on different structures in your back. These structures include:  The bones of your spine (vertebrae) and the joints surrounding your spinal cord and nerve roots (facets).  The strong, fibrous tissues that connect your vertebrae (ligaments). Degeneration of these structures may result in pressure on your nerves. This can lead to constant pain. HOME CARE INSTRUCTIONS  Avoid bending, heavy lifting, prolonged sitting, and activities which make the problem worse.  Take brief periods of rest throughout the day to reduce your pain. Lying down or standing usually is better than sitting while you are resting.  Take over-the-counter or prescription medicines only as directed by your caregiver. SEEK IMMEDIATE MEDICAL CARE IF:   You have weakness or numbness in one of your legs or feet.  You have trouble controlling your bladder or bowels.  You have nausea, vomiting, abdominal pain, shortness of breath, or fainting. Document Released: 10/18/2004 Document Revised: 12/03/2011 Document Reviewed: 08/25/2011 [facility] Patient Information 2015 [place]. This information is not intended to replace advice given to you by your health care provider. Make sure you discuss any questions you have with your health care provider.  Heel Spur A heel spur is a hook of bone that can form on the calcaneus (the heel bone and the largest bone of the foot). Heel spurs are often associated with plantar fasciitis and usually come in people who have had the problem for an extended period of time. The cause of the relationship is unknown. The pain associated with them is thought to be caused by an  inflammation (soreness and redness) of the plantar fascia rather than the spur itself. The plantar fascia is a thick fibrous like tissue that runs from the calcaneus (heel bone) to the ball of the foot. This strong, tight tissue helps maintain the arch of your foot. It helps distribute the weight across your foot as you walk or run. Stresses placed on the plantar fascia can be tremendous. When it is inflamed normal activities become painful. Pain is worse in the morning after sleeping. After sleeping the plantar fascia is tight. The first movements stretch the fascia and this causes pain. As the tendon loosens, the pain usually gets better. It often returns with too much standing or walking.  About 70% of patients with plantar fasciitis have a heel spur. About half of people without foot pain also have heel spurs. DIAGNOSIS  The diagnosis of a heel spur is made by X-ray. The X-ray shows a hook of bone protruding from the bottom of the calcaneus at the point where the plantar fascia is attached to the heel bone.  TREATMENT  It is necessary to find out what is causing the stretching of the plantar fascia. If the cause is over-pronation (flat feet), orthotics and proper foot ware may help.  Stretching exercises, losing weight, wearing shoes that have a cushioned heel that absorbs shock, and elevating the heel with the use of a heel cradle, heel cup, or orthotics may all help. Heel cradles and heel cups provide extra comfort and cushion to the heel, and reduce the amount of shock to the sore area. AVOIDING THE PAIN OF PLANTAR FASCIITIS AND HEEL SPURS  Consult a sports medicine professional before beginning a new exercise program.  Walking programs offer a good workout. There is a lower chance of overuse injuries common to the runners. There is less impact and less jarring of the joints.  Begin all new exercise programs slowly. If problems or pains develop, decrease the amount of time or distance until you  are at a comfortable level.  Wear good shoes and replace them regularly.  Stretch your foot and the heel cords at the back of the ankle (Achilles tendons) both before and after exercise.  Run or exercise on even surfaces that are not hard. For example, asphalt is better than pavement.  Do not run barefoot on hard surfaces.  If using a treadmill, vary the incline.  Do not continue to workout if you have foot or joint problems. Seek professional help if they do not improve. HOME CARE INSTRUCTIONS   Avoid activities that cause you pain until you recover.  Use ice or cold packs to the problem or painful areas after working out.  Only take over-the-counter or prescription medicines for pain, discomfort, or fever as directed by your caregiver.  Soft shoe inserts or athletic shoes with air or gel sole cushions may be helpful.  If problems continue or become more severe, consult a sports medicine caregiver. Cortisone is a potent anti-inflammatory medication that may be injected into the painful area. You can discuss this treatment with your caregiver. MAKE SURE YOU:  Understand these instructions.  Will watch your condition.  Will get help right away if you are not doing well or get worse. Document Released: 10/17/2005 Document Revised: 12/03/2011 Document Reviewed: 11/11/2013 [facility] Patient Information 2015 [place], [place]. This information is not intended to replace advice given to you by your health care provider. Make sure you discuss any questions you have with your health care provider.  Plantar Fasciitis Plantar fasciitis is a common condition that causes foot pain. It is soreness (inflammation) of the band of tough fibrous tissue on the bottom of the foot that runs from the heel bone (calcaneus) to the ball of the foot. The cause of this soreness may be from excessive standing, poor fitting shoes, running on hard surfaces, being overweight, having an abnormal walk, or overuse  (this is common in runners) of the painful foot or feet. It is also common in aerobic exercise dancers and ballet dancers. SYMPTOMS  Most people with plantar fasciitis complain of:  Severe pain in the morning on the bottom of their foot especially when taking the first steps out of bed. This pain recedes after a few minutes of walking.  Severe pain is experienced also during walking following a long period of inactivity.  Pain is worse when walking barefoot or up stairs DIAGNOSIS   Your caregiver will diagnose this condition by examining and feeling your foot.  Special tests such as X-rays of your foot, are usually not needed. PREVENTION   Consult a sports medicine professional before beginning a new exercise program.  Walking programs offer a good workout. With walking there is a lower chance of overuse injuries common to runners. There is less impact and less jarring of the joints.  Begin all new exercise programs slowly. If problems or pain develop, decrease the amount of time or distance until you are at a comfortable level.  Wear good shoes and replace them regularly.  Stretch your foot and the heel cords at the back of the ankle (Achilles tendon) both before and after exercise.  Run or exercise on even surfaces that are not hard. For example, asphalt is better than pavement.  Do not run barefoot on hard surfaces.  If using a treadmill, vary the incline.  Do not continue to workout if you have foot or joint problems. Seek professional help if they do not improve. HOME CARE INSTRUCTIONS   Avoid activities that cause you pain until you recover.  Use ice or cold packs on the problem or painful areas after working out.  Only take over-the-counter or prescription medicines for pain, discomfort, or fever as directed by your caregiver.  Soft shoe inserts or athletic shoes with air or gel sole cushions may be helpful.  If problems continue or become more severe, consult a  sports medicine caregiver or your own health care provider. Cortisone is a potent anti-inflammatory medication that may be injected into the painful area. You can discuss this treatment with your caregiver. MAKE SURE YOU:   Understand these instructions.  Will watch your condition.  Will get help right away if you are not doing well or get worse. Document Released: 06/05/2001 Document Revised: 12/03/2011 Document Reviewed: 08/04/2008 [facility] Patient Information 2015 [place], [place]. This information is not intended to replace advice given to you by your health care provider. Make sure you discuss any questions you have with your health care provider.

## 2015-01-20 DIAGNOSIS — G629 Polyneuropathy, unspecified: Secondary | ICD-10-CM | POA: Diagnosis not present

## 2015-01-20 DIAGNOSIS — Z79899 Other long term (current) drug therapy: Secondary | ICD-10-CM | POA: Diagnosis not present

## 2015-01-20 DIAGNOSIS — M79671 Pain in right foot: Secondary | ICD-10-CM | POA: Diagnosis not present

## 2015-01-20 DIAGNOSIS — Z7901 Long term (current) use of anticoagulants: Secondary | ICD-10-CM | POA: Diagnosis not present

## 2015-01-20 DIAGNOSIS — Z981 Arthrodesis status: Secondary | ICD-10-CM | POA: Diagnosis not present

## 2015-01-20 DIAGNOSIS — M722 Plantar fascial fibromatosis: Secondary | ICD-10-CM | POA: Diagnosis not present

## 2015-01-20 DIAGNOSIS — Z86711 Personal history of pulmonary embolism: Secondary | ICD-10-CM | POA: Diagnosis not present

## 2015-01-20 DIAGNOSIS — J45909 Unspecified asthma, uncomplicated: Secondary | ICD-10-CM | POA: Diagnosis not present

## 2015-01-20 DIAGNOSIS — Z96651 Presence of right artificial knee joint: Secondary | ICD-10-CM | POA: Diagnosis not present

## 2015-01-20 DIAGNOSIS — I10 Essential (primary) hypertension: Secondary | ICD-10-CM | POA: Diagnosis not present

## 2015-01-20 DIAGNOSIS — Z86718 Personal history of other venous thrombosis and embolism: Secondary | ICD-10-CM | POA: Diagnosis not present

## 2015-01-26 ENCOUNTER — Emergency Department (HOSPITAL_COMMUNITY)
Admission: EM | Admit: 2015-01-26 | Discharge: 2015-01-26 | Disposition: A | Discharge status: Home / Self Care | Payer: Medicare Other | Attending: Emergency Medicine | Admitting: Emergency Medicine

## 2015-01-26 ENCOUNTER — Encounter (HOSPITAL_COMMUNITY): Payer: Self-pay | Admitting: Emergency Medicine

## 2015-01-26 DIAGNOSIS — J45909 Unspecified asthma, uncomplicated: Secondary | ICD-10-CM | POA: Insufficient documentation

## 2015-01-26 DIAGNOSIS — Z7982 Long term (current) use of aspirin: Secondary | ICD-10-CM | POA: Insufficient documentation

## 2015-01-26 DIAGNOSIS — F329 Major depressive disorder, single episode, unspecified: Secondary | ICD-10-CM | POA: Diagnosis not present

## 2015-01-26 DIAGNOSIS — E785 Hyperlipidemia, unspecified: Secondary | ICD-10-CM | POA: Diagnosis not present

## 2015-01-26 DIAGNOSIS — Z862 Personal history of diseases of the blood and blood-forming organs and certain disorders involving the immune mechanism: Secondary | ICD-10-CM | POA: Insufficient documentation

## 2015-01-26 DIAGNOSIS — H409 Unspecified glaucoma: Secondary | ICD-10-CM | POA: Diagnosis not present

## 2015-01-26 DIAGNOSIS — G8929 Other chronic pain: Secondary | ICD-10-CM | POA: Diagnosis not present

## 2015-01-26 DIAGNOSIS — Z86711 Personal history of pulmonary embolism: Secondary | ICD-10-CM | POA: Diagnosis not present

## 2015-01-26 DIAGNOSIS — Z872 Personal history of diseases of the skin and subcutaneous tissue: Secondary | ICD-10-CM | POA: Insufficient documentation

## 2015-01-26 DIAGNOSIS — F419 Anxiety disorder, unspecified: Secondary | ICD-10-CM | POA: Diagnosis not present

## 2015-01-26 DIAGNOSIS — Z86718 Personal history of other venous thrombosis and embolism: Secondary | ICD-10-CM | POA: Diagnosis not present

## 2015-01-26 DIAGNOSIS — R200 Anesthesia of skin: Secondary | ICD-10-CM | POA: Insufficient documentation

## 2015-01-26 DIAGNOSIS — Z7901 Long term (current) use of anticoagulants: Secondary | ICD-10-CM | POA: Diagnosis not present

## 2015-01-26 DIAGNOSIS — M545 Low back pain: Secondary | ICD-10-CM | POA: Diagnosis present

## 2015-01-26 DIAGNOSIS — M1990 Unspecified osteoarthritis, unspecified site: Secondary | ICD-10-CM | POA: Insufficient documentation

## 2015-01-26 DIAGNOSIS — I10 Essential (primary) hypertension: Secondary | ICD-10-CM | POA: Diagnosis not present

## 2015-01-26 DIAGNOSIS — Z79899 Other long term (current) drug therapy: Secondary | ICD-10-CM | POA: Diagnosis not present

## 2015-01-26 DIAGNOSIS — Z9981 Dependence on supplemental oxygen: Secondary | ICD-10-CM | POA: Insufficient documentation

## 2015-01-26 DIAGNOSIS — Z8701 Personal history of pneumonia (recurrent): Secondary | ICD-10-CM | POA: Diagnosis not present

## 2015-01-26 DIAGNOSIS — G4733 Obstructive sleep apnea (adult) (pediatric): Secondary | ICD-10-CM | POA: Insufficient documentation

## 2015-01-26 DIAGNOSIS — M549 Dorsalgia, unspecified: Secondary | ICD-10-CM | POA: Diagnosis not present

## 2015-01-26 DIAGNOSIS — M5441 Lumbago with sciatica, right side: Secondary | ICD-10-CM

## 2015-01-26 DIAGNOSIS — M5442 Lumbago with sciatica, left side: Secondary | ICD-10-CM

## 2015-01-26 DIAGNOSIS — K219 Gastro-esophageal reflux disease without esophagitis: Secondary | ICD-10-CM | POA: Insufficient documentation

## 2015-01-26 DIAGNOSIS — M5440 Lumbago with sciatica, unspecified side: Secondary | ICD-10-CM | POA: Diagnosis not present

## 2015-01-26 DIAGNOSIS — R011 Cardiac murmur, unspecified: Secondary | ICD-10-CM | POA: Insufficient documentation

## 2015-01-26 MED ORDER — TRAMADOL HCL 50 MG PO TABS
50.0000 mg | ORAL_TABLET | Freq: Once | ORAL | Status: AC
  Administered 2015-01-26: 50 mg via ORAL
  Filled 2015-01-26: qty 1

## 2015-01-26 MED ORDER — TRAMADOL HCL 50 MG PO TABS: 50.0000 mg | ORAL_TABLET | Freq: Four times a day (QID) | ORAL | Status: DC | PRN

## 2015-01-26 NOTE — ED Notes (Signed)
Per EMS pt complaining of back pain that has been on-going for the last 15 years. Pt requesting "a shot in her back that makes it better." Denies any new trauma to area. Complains of it radiating from left buttock into left knee

## 2015-01-26 NOTE — Discharge Instructions (Signed)
Please follow the directions provided.  Be sure to follow-up with Dr. Jarvis NewcomerGrunz regarding your back pain.  Take your prescribed medications. Don't hesitate to return for any new, worsening or concerning symptoms.     SEEK IMMEDIATE MEDICAL CARE IF:  You have weakness or numbness in one of your legs or feet.  You have trouble controlling your bladder or bowels.  You have nausea, vomiting, abdominal pain, shortness of breath, or fainting.

## 2015-01-26 NOTE — ED Notes (Signed)
Bed: WTR6 Expected date:  Expected time:  Means of arrival:  Comments: EMS [age] female atraumatic back pain radiates to left knee

## 2015-01-26 NOTE — Progress Notes (Addendum)
EDCM consulted to speak to patient regarding pcp issues.  Patient listed as having [employer] Medicare insurance with Dr. Jarvis NewcomerGrunz [facility] her [facility].   Patient noted to be seen in the ED 20 times within the last six months. This EDCM spoke to patient in September 2015 and patient was considering changing her pcp then as well.  She reports she is continually have pain and discomfort.  Patient reports she went to pain management but "I quit."  Patient reports,  "Some people get better from pain management others don't."  [facility] if she quit, or did she break her pain contract?  Patient stated, "No I never signed one of those."  A few moments later patient reports she never went to pain management.  [facility] to stay with Dr. Jarvis NewcomerGrunz since he is familiar with her.  EDCM encouraged patient to make her needs known to her pcp, patient reports she thinks she requires more testing.  [facility] to ask her pcp for referral to pain management.  EDCM offered to provide patient with list of pcps who accept her insurance, but patient refused and reports she will stay with Dr. Jarvis NewcomerGrunz.  [facility]?  Patient reports, "I can just walk in.  I don't know where I would be without them."  Discussed patient with EDPA.  No further EDCM needs at this time.

## 2015-01-26 NOTE — ED Provider Notes (Signed)
CSN: [id number]     Arrival date & time 01/26/15  1958 History  This chart was scribed for non-physician provider Harle BattiestElizabeth Hurley Sobel, NP-C, working with Rolan BuccoMelanie Belfi, MD by Phillis HaggisGabriella Gaje, ED Scribe. This patient was seen in room WTR6/WTR6 and patient care was started at 8:27 PM.   Chief Complaint  Patient presents with  . Back Pain   Patient is a 74 y.o. female presenting with back pain. The history is provided by the patient. No language interpreter was used.  Back Pain Associated symptoms: numbness    HPI Comments: Lamont SnowballDora C Figuero is a 74 y.o. female with a history of spinal fusion who presents to the Emergency Department complaining of back pain. She states that this is a chronic problem that has been ongoing for 20 years. She states that she needs a PCP. She reports that [facility] but did not agree with the doctor and chose to stop going there. She states that this pain is the same as it has always been. She states that her back pain radiates to her feet and is constant, but chose to come into the ED today because she states that continuously suffering this pain is depressing. She states that she has been unable to receive testing that she has requested to figure out how to treat her pain. She states that Dr. August Saucerean performed her spinal fusion surgery.  Past Medical History  Diagnosis Date  . Depression   . Schizophrenia   . Hyperlipidemia   . Hypertension   . Chronic pain     "over my whole body" (03/30/2013)  . 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 DVT (deep venous thrombosis)     per 01/17/10 d/c summary- "remote hx of dvt"?  . Iron deficiency anemia   . Glaucoma   . Hemorrhoids   . Asthma   . Anxiety   . GERD (gastroesophageal reflux disease)   . Psoriasis 04/29/2012    New onset evaluated by Dr Marylou FlesherWilliam Huang, [facility] 6/[facility]  Rx 0.1% Tacrolimus ointment  . Complication of anesthesia     "because  I have sleep apnea" (03/30/2013)  . Heart murmur   . Chest pain, exertional   . Chronic bronchitis   . Sleep apnea     "waiting on my CPAP" (03/30/2013)  . Pneumonia     "a few times" (03/30/2013)  . Exertional shortness of breath     "& sometimes when laying down" (03/30/2013)  . Daily headache     "last 2 months" (03/30/2013)  . Arthritis     "joints" (03/30/2013)  . Varicose veins of legs   . Psoriasis    Past Surgical History  Procedure Laterality Date  . Spinal fusion      2004  . Carpal tunnel release Bilateral   . Tubal ligation  1973  . Dilation and curettage of uterus  1970's    "once" (03/30/2013)  . 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 Joint replacement    . Vein surgery  left leg   Family History  Problem Relation Age of Onset  . Diabetes Sister   . Colon cancer Brother 40  . Colon cancer Brother    History  Substance Use Topics  . Smoking status: Never Smoker   . Smokeless tobacco: Never Used  . Alcohol Use: No   OB History    No data available     Review  of Systems  Musculoskeletal: Positive for back pain.  Neurological: Positive for numbness.   Allergies  Other  Home Medications   Prior to Admission medications   Medication Sig Start Date End Date Taking? Authorizing Provider  acetaminophen (TYLENOL) 500 MG tablet Take 2 tablets (1,000 mg total) by mouth every 8 (eight) hours as needed. Patient taking differently: Take 1,000 mg by mouth every 8 (eight) hours as needed for mild pain or moderate pain.  11/22/14   Courtney Forcucci, PA-C  albuterol (PROVENTIL HFA;VENTOLIN HFA) 108 (90 BASE) MCG/ACT inhaler Inhale 2 puffs into the lungs every 6 (six) hours as needed for wheezing or shortness of breath (wheezing). 12/08/14   Tyrone Nineyan B Grunz, MD  aspirin 325 MG tablet Take 650 mg by mouth once.     Historical Provider, MD  atenolol (TENORMIN) 100 MG tablet Take 100 mg by mouth daily.    Historical Provider, MD  atorvastatin (LIPITOR) 40 MG tablet Take 40 mg by  mouth daily.    Historical Provider, MD  beclomethasone (QVAR) 80 MCG/ACT inhaler Inhale 2 puffs into the lungs 2 (two) times daily as needed (wheezing).     Historical Provider, MD  cyclobenzaprine (FLEXERIL) 10 MG tablet Take 1 tablet (10 mg total) by mouth 3 (three) times daily as needed for muscle spasms. 12/08/14   Tyrone Nineyan B Grunz, MD  diazepam (VALIUM) 2 MG tablet Take 1 tablet (2 mg total) by mouth every 12 (twelve) hours as needed (vertigo). 12/28/14   Gilda Creasehristopher J Pollina, MD  FLUoxetine (PROZAC) 20 MG capsule Take 3 capsules (60 mg total) by mouth daily. 05/26/14   Tyrone Nineyan B Grunz, MD  furosemide (LASIX) 20 MG tablet Take 1 tablet (20 mg total) by mouth daily. Patient taking differently: Take 20 mg by mouth as needed for fluid.  10/29/14   Tyrone Nineyan B Grunz, MD  gabapentin (NEURONTIN) 400 MG capsule Take 1 capsule (400 mg total) by mouth 3 (three) times daily. 12/08/14   Tyrone Nineyan B Grunz, MD  hydrochlorothiazide (HYDRODIURIL) 25 MG tablet Take 25 mg by mouth daily.    Historical Provider, MD  hydroxypropyl methylcellulose / hypromellose (ISOPTO TEARS / GONIOVISC) 2.5 % ophthalmic solution Place 1 drop into both eyes 3 (three) times daily as needed for dry eyes.    Historical Provider, MD  hydrOXYzine (ATARAX/VISTARIL) 10 MG tablet Take 1 tablet (10 mg total) by mouth at bedtime as needed for itching (itching). 10/29/14   Tyrone Nineyan B Grunz, MD  lisinopril (PRINIVIL,ZESTRIL) 20 MG tablet Take 1 tablet (20 mg total) by mouth daily. 12/08/14   Tyrone Nineyan B Grunz, MD  meclizine (ANTIVERT) 25 MG tablet TAKE 1 TABLET BY MOUTH 3 TIMES A DAY AS NEEDED FOR DIZZINESS AND VERTIGO (TOO EARLY 07/09/14) 01/05/15   Tyrone Nineyan B Grunz, MD  methocarbamol (ROBAXIN) 500 MG tablet Take 1 tablet (500 mg total) by mouth 3 (three) times daily between meals as needed. Patient taking differently: Take 500 mg by mouth 3 (three) times daily between meals as needed for muscle spasms.  12/12/14   Rolland PorterMark James, MD  omeprazole (PRILOSEC) 40 MG capsule Take 1 capsule  (40 mg total) by mouth daily. 08/31/14   Arby BarretteMarcy Pfeiffer, MD  silver sulfADIAZINE (SILVADENE) 1 % cream APPLY TOPICALLY EVERY DAY 12/10/14   Tyrone Nineyan B Grunz, MD  tacrolimus (PROTOPIC) 0.1 % ointment Apply 1 application topically 3 (three) times daily as needed (for legs). Apply to affected areas daily PRN 03/01/14 03/01/15  Historical Provider, MD  traMADol (ULTRAM) 50 MG tablet Take  1 tablet (50 mg total) by mouth every 6 (six) hours as needed. Patient taking differently: Take 50 mg by mouth every 6 (six) hours as needed for moderate pain.  12/16/14   Fayrene Helper, PA-C  triamcinolone cream (KENALOG) 0.1 % Apply 1 application topically 2 (two) times daily as needed (for legs). 10/29/14   Tyrone Nine, MD  warfarin (COUMADIN) 5 MG tablet TAKE 1 TABLET BY MOUTH EVERY DAY 10/18/14   Levert Feinstein, MD   BP 143/85 mmHg  Pulse 74  Resp 18  SpO2 94%  LMP 11/22/1996   Physical Exam  Constitutional: She is oriented to person, place, and time. She appears well-developed and well-nourished. No distress.  Sat in wheelchair  HENT:  Head: Normocephalic and atraumatic.  Mouth/Throat: Oropharynx is clear and moist.  Eyes: Conjunctivae and EOM are normal.  Neck: Normal range of motion. Neck supple.  Cardiovascular: Normal rate, regular rhythm and normal heart sounds.   Pulmonary/Chest: Effort normal and breath sounds normal. No respiratory distress.  Musculoskeletal: Normal range of motion. She exhibits no edema.  5/5 strength of plantar and dorsiflexion bilaterally; normal 2 pt discrimination bilaterally in lower extremities.  Neurological: She is alert and oriented to person, place, and time. No sensory deficit.  Skin: Skin is warm and dry.  Psychiatric: She has a normal mood and affect. Her behavior is normal.  Nursing note and vitals reviewed.   ED Course  Procedures (including critical care time) DIAGNOSTIC STUDIES: Oxygen Saturation is 94% on room air, adequate by my interpretation.    COORDINATION  OF CARE: 8:35 PM-Discussed treatment plan which includes discussion with care management  with pt at bedside and pt agreed to plan.   Labs Review Labs Reviewed - No data to display  Imaging Review No results found.   EKG Interpretation None      MDM   Final diagnoses:  Bilateral low back pain with sciatica, sciatica laterality unspecified    74 yo with chronic back pain presenting with her normal back pain that has been occurring for 15-20 years.  She denies any new symptoms or any red flag symptoms.  She is requesting pain meds because her current doctor will not prescribe them.  Care mgmt knows pt well and discussed pt's options for finding a new doctor but encouraged her to return to her current doctor because they are caring for all her chronic needs just not prescribing pain meds.  Discussed with pt would not prescribe opioid pain meds but would give her a dose of ultram. Pt is well-appearing, in no acute distress and vital signs reviewed and not concerning. She appears safe to be discharged.  Discharge include follow-up with their PCP.  Return precautions provided. Pt aware of plan and in agreement.    I personally performed the services described in this documentation, which was scribed in my presence. The recorded information has been reviewed and is accurate.  Filed Vitals:   01/26/15 2003 01/26/15 2114  BP: 143/85 141/39  Pulse: 74 71  Resp: 18 16  SpO2: 94% 95%   Meds given in ED:  Medications  traMADol (ULTRAM) tablet 50 mg (50 mg Oral Given 01/26/15 2113)    Discharge Medication List as of 01/26/2015  9:00 PM       Harle Battiest, NP 01/28/15 1610  Azalia Bilis, MD 01/28/15 [id number]

## 2015-02-01 ENCOUNTER — Encounter (HOSPITAL_COMMUNITY): Payer: Self-pay | Admitting: *Deleted

## 2015-02-01 ENCOUNTER — Emergency Department (HOSPITAL_COMMUNITY)
Admission: EM | Admit: 2015-02-01 | Discharge: 2015-02-01 | Disposition: A | Discharge status: Home / Self Care | Payer: Medicare Other | Attending: Emergency Medicine | Admitting: Emergency Medicine

## 2015-02-01 DIAGNOSIS — Z7901 Long term (current) use of anticoagulants: Secondary | ICD-10-CM | POA: Insufficient documentation

## 2015-02-01 DIAGNOSIS — Z79899 Other long term (current) drug therapy: Secondary | ICD-10-CM | POA: Insufficient documentation

## 2015-02-01 DIAGNOSIS — K219 Gastro-esophageal reflux disease without esophagitis: Secondary | ICD-10-CM | POA: Insufficient documentation

## 2015-02-01 DIAGNOSIS — G8929 Other chronic pain: Secondary | ICD-10-CM | POA: Diagnosis not present

## 2015-02-01 DIAGNOSIS — E785 Hyperlipidemia, unspecified: Secondary | ICD-10-CM | POA: Diagnosis not present

## 2015-02-01 DIAGNOSIS — Z86711 Personal history of pulmonary embolism: Secondary | ICD-10-CM | POA: Diagnosis not present

## 2015-02-01 DIAGNOSIS — R011 Cardiac murmur, unspecified: Secondary | ICD-10-CM | POA: Diagnosis not present

## 2015-02-01 DIAGNOSIS — Z8701 Personal history of pneumonia (recurrent): Secondary | ICD-10-CM | POA: Diagnosis not present

## 2015-02-01 DIAGNOSIS — Z86718 Personal history of other venous thrombosis and embolism: Secondary | ICD-10-CM | POA: Insufficient documentation

## 2015-02-01 DIAGNOSIS — Z4733 Aftercare following explantation of knee joint prosthesis: Secondary | ICD-10-CM | POA: Insufficient documentation

## 2015-02-01 DIAGNOSIS — J45909 Unspecified asthma, uncomplicated: Secondary | ICD-10-CM | POA: Diagnosis not present

## 2015-02-01 DIAGNOSIS — R42 Dizziness and giddiness: Secondary | ICD-10-CM | POA: Insufficient documentation

## 2015-02-01 DIAGNOSIS — I6789 Other cerebrovascular disease: Secondary | ICD-10-CM | POA: Diagnosis not present

## 2015-02-01 DIAGNOSIS — I10 Essential (primary) hypertension: Secondary | ICD-10-CM | POA: Diagnosis not present

## 2015-02-01 DIAGNOSIS — G629 Polyneuropathy, unspecified: Secondary | ICD-10-CM | POA: Diagnosis not present

## 2015-02-01 DIAGNOSIS — G44229 Chronic tension-type headache, not intractable: Secondary | ICD-10-CM | POA: Insufficient documentation

## 2015-02-01 LAB — CBC WITH DIFFERENTIAL/PLATELET
Basophils Absolute: 0 10*3/uL (ref 0.0–0.1)
Basophils Relative: 0 % (ref 0–1)
EOS PCT: 3 % (ref 0–5)
Eosinophils Absolute: 0.2 10*3/uL (ref 0.0–0.7)
HCT: 34.6 % — ABNORMAL LOW (ref 36.0–46.0)
Hemoglobin: 11.2 g/dL — ABNORMAL LOW (ref 12.0–15.0)
LYMPHS ABS: 1.1 10*3/uL (ref 0.7–4.0)
LYMPHS PCT: 20 % (ref 12–46)
MCH: 30.2 pg (ref 26.0–34.0)
MCHC: 32.4 g/dL (ref 30.0–36.0)
MCV: 93.3 fL (ref 78.0–100.0)
Monocytes Absolute: 0.3 10*3/uL (ref 0.1–1.0)
Monocytes Relative: 5 % (ref 3–12)
NEUTROS ABS: 4 10*3/uL (ref 1.7–7.7)
NEUTROS PCT: 72 % (ref 43–77)
PLATELETS: 213 10*3/uL (ref 150–400)
RBC: 3.71 MIL/uL — AB (ref 3.87–5.11)
RDW: 15 % (ref 11.5–15.5)
WBC: 5.6 10*3/uL (ref 4.0–10.5)

## 2015-02-01 LAB — BASIC METABOLIC PANEL
Anion gap: 8 (ref 5–15)
BUN: 11 mg/dL (ref 6–20)
CO2: 29 mmol/L (ref 22–32)
Calcium: 9.2 mg/dL (ref 8.9–10.3)
Chloride: 101 mmol/L (ref 101–111)
Creatinine, Ser: 1.13 mg/dL — ABNORMAL HIGH (ref 0.44–1.00)
GFR calc Af Amer: 55 mL/min — ABNORMAL LOW (ref >60)
GFR, EST NON AFRICAN AMERICAN: 48 mL/min — AB (ref >60)
GLUCOSE: 110 mg/dL — AB (ref 70–99)
POTASSIUM: 3.5 mmol/L (ref 3.5–5.1)
Sodium: 138 mmol/L (ref 135–145)

## 2015-02-01 LAB — PROTIME-INR
INR: 1.96 — ABNORMAL HIGH (ref 0.00–1.49)
Prothrombin Time: 22.5 seconds — ABNORMAL HIGH (ref 11.6–15.2)

## 2015-02-01 MED ORDER — MECLIZINE HCL 25 MG PO TABS: 25.0000 mg | ORAL_TABLET | Freq: Three times a day (TID) | ORAL | Status: DC | PRN

## 2015-02-01 MED ORDER — ESOMEPRAZOLE MAGNESIUM 40 MG PO CPDR: 40.0000 mg | DELAYED_RELEASE_CAPSULE | Freq: Every day | ORAL | Status: DC
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
## 2015-02-01 NOTE — ED Notes (Signed)
Pt was attempting leave with IV in. Pt was very verbal in lobby and unable to be re-directed. Pt finally consented to have IV removed. Cap was called for patient to leave property.

## 2015-02-01 NOTE — Discharge Instructions (Signed)
Benign Positional Vertigo Vertigo means you feel like you or your surroundings are moving when they are not. Benign positional vertigo is the most common form of vertigo. Benign means that the cause of your condition is not serious. Benign positional vertigo is more common in older adults. CAUSES  Benign positional vertigo is the result of an upset in the labyrinth system. This is an area in the middle ear that helps control your balance. This may be caused by a viral infection, head injury, or repetitive motion. However, often no specific cause is found. SYMPTOMS  Symptoms of benign positional vertigo occur when you move your head or eyes in different directions. Some of the symptoms may include:  Loss of balance and falls.  Vomiting.  Blurred vision.  Dizziness.  Nausea.  Involuntary eye movements (nystagmus). DIAGNOSIS  Benign positional vertigo is usually diagnosed by physical exam. If the specific cause of your benign positional vertigo is unknown, your caregiver may perform imaging tests, such as magnetic resonance imaging (MRI) or computed tomography (CT). TREATMENT  Your caregiver may recommend movements or procedures to correct the benign positional vertigo. Medicines such as meclizine, benzodiazepines, and medicines for nausea may be used to treat your symptoms. In rare cases, if your symptoms are caused by certain conditions that affect the inner ear, you may need surgery. HOME CARE INSTRUCTIONS   Follow your caregiver's instructions.  Move slowly. Do not make sudden body or head movements.  Avoid driving.  Avoid operating heavy machinery.  Avoid performing any tasks that would be dangerous to you or others during a vertigo episode.  Drink enough fluids to keep your urine clear or pale yellow. SEEK IMMEDIATE MEDICAL CARE IF:   You develop problems with walking, weakness, numbness, or using your arms, hands, or legs.  You have difficulty speaking.  You develop  severe headaches.  Your nausea or vomiting continues or gets worse.  You develop visual changes.  Your family or friends notice any behavioral changes.  Your condition gets worse.  You have a fever.  You develop a stiff neck or sensitivity to light. MAKE SURE YOU:   Understand these instructions.  Will watch your condition.  Will get help right away if you are not doing well or get worse. Document Released: 06/18/2006 Document Revised: 12/03/2011 Document Reviewed: 05/31/2011 [facility] Patient Information 2015 [place] [place]. This information is not intended to replace advice given to you by your health care provider. Make sure you discuss any questions you have with your health care provider. Neuropathic Pain We often think that pain has a physical cause. If we get rid of the cause, the pain should go away. Nerves themselves can also cause pain. It is called neuropathic pain, which means nerve abnormality. It may be difficult for the patients who have it and for the treating caregivers. Pain is usually described as acute (short-lived) or chronic (long-lasting). Acute pain is related to the physical sensations caused by an injury. It can last from a few seconds to many weeks, but it usually goes away when normal healing occurs. Chronic pain lasts beyond the typical healing time. With neuropathic pain, the nerve fibers themselves may be damaged or injured. They then send incorrect signals to other pain centers. The pain you feel is real, but the cause is not easy to find.  CAUSES  Chronic pain can result from diseases, such as diabetes and shingles (an infection related to chickenpox), or from trauma, surgery, or amputation. It can also happen without  any known injury or disease. The nerves are sending pain messages, even though there is no identifiable cause for such messages.   Other common causes of neuropathy include diabetes, phantom limb pain, or Regional Pain Syndrome (RPS).  As  with all forms of chronic back pain, if neuropathy is not correctly treated, there can be a number of associated problems that lead to a downward cycle for the patient. These include depression, sleeplessness, feelings of fear and anxiety, limited social interaction and inability to do normal daily activities or work.  The most dramatic and mysterious example of neuropathic pain is called "phantom limb syndrome." This occurs when an arm or a leg has been removed because of illness or injury. The brain still gets pain messages from the nerves that originally carried impulses from the missing limb. These nerves now seem to misfire and cause troubling pain.  Neuropathic pain often seems to have no cause. It responds poorly to standard pain treatment. Neuropathic pain can occur after:  Shingles (herpes zoster virus infection).  A lasting burning sensation of the skin, caused usually by injury to a peripheral nerve.  Peripheral neuropathy which is widespread nerve damage, often caused by diabetes or alcoholism.  Phantom limb pain following an amputation.  Facial nerve problems (trigeminal neuralgia).  Multiple sclerosis.  Reflex sympathetic dystrophy.  Pain which comes with cancer and cancer chemotherapy.  Entrapment neuropathy such as when pressure is put on a nerve such as in carpal tunnel syndrome.  Back, leg, and hip problems (sciatica).  Spine or back surgery.  HIV Infection or AIDS where nerves are infected by viruses. Your caregiver can explain items in the above list which may apply to you. SYMPTOMS  Characteristics of neuropathic pain are:  Severe, sharp, electric shock-like, shooting, lightening-like, knife-like.  Pins and needles sensation.  Deep burning, deep cold, or deep ache.  Persistent numbness, tingling, or weakness.  Pain resulting from light touch or other stimulus that would not usually cause pain.  Increased sensitivity to something that would normally  cause pain, such as a pinprick. Pain may persist for months or years following the healing of damaged tissues. When this happens, pain signals no longer sound an alarm about current injuries or injuries about to happen. Instead, the alarm system itself is not working correctly.  Neuropathic pain may get worse instead of better over time. For some people, it can lead to serious disability. It is important to be aware that severe injury in a limb can occur without a proper, protective pain response.Burns, cuts, and other injuries may go unnoticed. Without proper treatment, these injuries can become infected or lead to further disability. Take any injury seriously, and consult your caregiver for treatment. DIAGNOSIS  When you have a pain with no known cause, your caregiver will probably ask some specific questions:   Do you have any other conditions, such as diabetes, shingles, multiple sclerosis, or HIV infection?  How would you describe your pain? (Neuropathic pain is often described as shooting, stabbing, burning, or searing.)  Is your pain worse at any time of the day? (Neuropathic pain is usually worse at night.)  Does the pain seem to follow a certain physical pathway?  Does the pain come from an area that has missing or injured nerves? (An example would be phantom limb pain.)  Is the pain triggered by minor things such as rubbing against the sheets at night? These questions often help define the type of pain involved. Once your caregiver knows what  is happening, treatment can begin. Anticonvulsant, antidepressant drugs, and various pain relievers seem to work in some cases. If another condition, such as diabetes is involved, better management of that disorder may relieve the neuropathic pain.  TREATMENT  Neuropathic pain is frequently long-lasting and tends not to respond to treatment with narcotic type pain medication. It may respond well to other drugs such as antiseizure and antidepressant  medications. Usually, neuropathic problems do not completely go away, but partial improvement is often possible with proper treatment. Your caregivers have large numbers of medications available to treat you. Do not be discouraged if you do not get immediate relief. Sometimes different medications or a combination of medications will be tried before you receive the results you are hoping for. See your caregiver if you have pain that seems to be coming from nowhere and does not go away. Help is available.  SEEK IMMEDIATE MEDICAL CARE IF:   There is a sudden change in the quality of your pain, especially if the change is on only one side of the body.  You notice changes of the skin, such as redness, black or purple discoloration, swelling, or an ulcer.  You cannot move the affected limbs. Document Released: 06/07/2004 Document Revised: 12/03/2011 Document Reviewed: 06/07/2004 [facility] Patient Information 2015 [place], [place]. This information is not intended to replace advice given to you by your health care provider. Make sure you discuss any questions you have with your health care provider.

## 2015-02-01 NOTE — ED Provider Notes (Signed)
CSN: [id number]     Arrival date & time 02/01/15  1147 History   First MD Initiated Contact with Patient 02/01/15 1153     Chief Complaint  Patient presents with  . Dizziness  . Headache     (Consider location/radiation/quality/duration/timing/severity/associated sxs/prior Treatment) HPI The patient reports that she has had increasing and more chronic vertigo over about a 6 month duration. She used to get it intermittently and it would resolve. She has an appointment to see her doctor at the end of the month but that was the earliest appointment she could get. She reports that the quality of her symptoms is spinning and worse with motion and better if she closes her eyes remain still. She also reports she's had chronic headaches which have a pressure quality on the sides of her head. There is no neck pain or stiffness. No fevers. She reports she does get some additional nasal discharge seasonally. She endorses general fatigue. She reports that she chronically has pain and burning in her feet and legs. There is been no focal weakness. She is about to run out of her Antivert. Past Medical History  Diagnosis Date  . Depression   . Schizophrenia   . Hyperlipidemia   . Hypertension   . Chronic pain     "over my whole body" (03/30/2013)  . 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 DVT (deep venous thrombosis)     per 01/17/10 d/c summary- "remote hx of dvt"?  . Iron deficiency anemia   . Glaucoma   . Hemorrhoids   . Asthma   . Anxiety   . GERD (gastroesophageal reflux disease)   . Psoriasis 04/29/2012    New onset evaluated by Dr Marylou Flesher, Dermatology, [facility] 6/13  Rx 0.1% Tacrolimus ointment  . Complication of anesthesia     "because I have sleep apnea" (03/30/2013)  . Heart murmur   . Chest pain, exertional   . Chronic bronchitis   . Sleep apnea     "waiting on my CPAP" (03/30/2013)  . Pneumonia     "a few times" (03/30/2013)  . Exertional shortness of breath      "& sometimes when laying down" (03/30/2013)  . Daily headache     "last 2 months" (03/30/2013)  . Arthritis     "joints" (03/30/2013)  . Varicose veins of legs   . Psoriasis    Past Surgical History  Procedure Laterality Date  . Spinal fusion      2004  . Carpal tunnel release Bilateral   . Tubal ligation  1973  . Dilation and curettage of uterus  1970's    "once" (03/30/2013)  . 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 Joint replacement    . Vein surgery  left leg   Family History  Problem Relation Age of Onset  . Diabetes Sister   . Colon cancer Brother 40  . Colon cancer Brother    History  Substance Use Topics  . Smoking status: Never Smoker   . Smokeless tobacco: Never Used  . Alcohol Use: No   OB History    No data available     Review of Systems 10 Systems reviewed and are negative for acute change except as noted in the HPI.    Allergies  Other  Home Medications   Prior to Admission medications   Medication Sig Start Date End Date Taking? Authorizing Provider  acetaminophen (TYLENOL) 500 MG tablet Take 2 tablets (  1,000 mg total) by mouth every 8 (eight) hours as needed. Patient taking differently: Take 1,000 mg by mouth every 8 (eight) hours as needed for mild pain or moderate pain.  11/22/14  Yes Courtney Forcucci, PA-C  albuterol (PROVENTIL HFA;VENTOLIN HFA) 108 (90 BASE) MCG/ACT inhaler Inhale 2 puffs into the lungs every 6 (six) hours as needed for wheezing or shortness of breath (wheezing). 12/08/14  Yes Tyrone Nineyan B Grunz, MD  atenolol (TENORMIN) 100 MG tablet Take 100 mg by mouth daily.   Yes Historical Provider, MD  beclomethasone (QVAR) 80 MCG/ACT inhaler Inhale 2 puffs into the lungs 2 (two) times daily as needed (wheezing).    Yes Historical Provider, MD  FLUoxetine (PROZAC) 20 MG capsule Take 3 capsules (60 mg total) by mouth daily. 05/26/14  Yes Tyrone Nineyan B Grunz, MD  furosemide (LASIX) 20 MG tablet Take 1 tablet (20 mg total) by mouth daily. Patient taking  differently: Take 20 mg by mouth as needed for fluid.  10/29/14  Yes Tyrone Nineyan B Grunz, MD  furosemide (LASIX) 20 MG tablet Take 20 mg by mouth daily as needed for fluid (in legs).   Yes Historical Provider, MD  gabapentin (NEURONTIN) 400 MG capsule Take 1 capsule (400 mg total) by mouth 3 (three) times daily. 12/08/14  Yes Tyrone Nineyan B Grunz, MD  hydrochlorothiazide (HYDRODIURIL) 25 MG tablet Take 25 mg by mouth daily.   Yes Historical Provider, MD  hydroxypropyl methylcellulose / hypromellose (ISOPTO TEARS / GONIOVISC) 2.5 % ophthalmic solution Place 1 drop into both eyes 3 (three) times daily as needed for dry eyes.   Yes Historical Provider, MD  hydrOXYzine (ATARAX/VISTARIL) 10 MG tablet Take 1 tablet (10 mg total) by mouth at bedtime as needed for itching (itching). 10/29/14  Yes Tyrone Nineyan B Grunz, MD  lisinopril (PRINIVIL,ZESTRIL) 20 MG tablet Take 1 tablet (20 mg total) by mouth daily. 12/08/14  Yes Tyrone Nineyan B Grunz, MD  meclizine (ANTIVERT) 25 MG tablet TAKE 1 TABLET BY MOUTH 3 TIMES A DAY AS NEEDED FOR DIZZINESS AND VERTIGO (TOO EARLY 07/09/14) 01/05/15  Yes Tyrone Nineyan B Grunz, MD  omeprazole (PRILOSEC) 40 MG capsule Take 1 capsule (40 mg total) by mouth daily. 08/31/14  Yes Arby BarretteMarcy Zamariah Seaborn, MD  silver sulfADIAZINE (SILVADENE) 1 % cream APPLY TOPICALLY EVERY DAY Patient taking differently: APPLY TOPICALLY EVERY DAY BID on legs for dryness 12/10/14  Yes Tyrone Nineyan B Grunz, MD  tacrolimus (PROTOPIC) 0.1 % ointment Apply 1 application topically 3 (three) times daily as needed (for legs). Apply to affected areas daily PRN 03/01/14 03/01/15 Yes Historical Provider, MD  traMADol (ULTRAM) 50 MG tablet Take 1 tablet (50 mg total) by mouth every 6 (six) hours as needed. 01/26/15  Yes Harle BattiestElizabeth Tysinger, NP  triamcinolone cream (KENALOG) 0.1 % Apply 1 application topically 2 (two) times daily as needed (for legs). 10/29/14  Yes Tyrone Nineyan B Grunz, MD  warfarin (COUMADIN) 5 MG tablet TAKE 1 TABLET BY MOUTH EVERY DAY 10/18/14  Yes Levert FeinsteinJames M Granfortuna, MD   atorvastatin (LIPITOR) 40 MG tablet Take 40 mg by mouth daily.    Historical Provider, MD  cyclobenzaprine (FLEXERIL) 10 MG tablet Take 1 tablet (10 mg total) by mouth 3 (three) times daily as needed for muscle spasms. Patient not taking: Reported on 02/01/2015 12/08/14   Tyrone Nineyan B Grunz, MD  diazepam (VALIUM) 2 MG tablet Take 1 tablet (2 mg total) by mouth every 12 (twelve) hours as needed (vertigo). Patient not taking: Reported on 02/01/2015 12/28/14   Gilda Creasehristopher J Pollina, MD  meclizine (ANTIVERT) 25 MG tablet Take 1 tablet (25 mg total) by mouth 3 (three) times daily as needed for dizziness. 02/01/15   Arby BarretteMarcy Shawntel Farnworth, MD  methocarbamol (ROBAXIN) 500 MG tablet Take 1 tablet (500 mg total) by mouth 3 (three) times daily between meals as needed. Patient not taking: Reported on 02/01/2015 12/12/14   Rolland PorterMark James, MD   BP 143/86 mmHg  Pulse 51  Temp(Src) 98.2 F (36.8 C) (Oral)  Resp 18  SpO2 99%  LMP 11/22/1996 Physical Exam  Constitutional: She is oriented to person, place, and time. She appears well-developed and well-nourished.  HENT:  Head: Normocephalic and atraumatic.  Right Ear: External ear normal.  Left Ear: External ear normal.  Nose: Nose normal.  Mouth/Throat: Oropharynx is clear and moist. No oropharyngeal exudate.  Eyes: EOM are normal. Pupils are equal, round, and reactive to light.  Neck: Neck supple. No thyromegaly present.  Cardiovascular: Normal rate, regular rhythm, normal heart sounds and intact distal pulses.   Pulmonary/Chest: Effort normal and breath sounds normal.  Abdominal: Soft. Bowel sounds are normal. She exhibits no distension. There is no tenderness.  Musculoskeletal: Normal range of motion. She exhibits no edema.  Lymphadenopathy:    She has no cervical adenopathy.  Neurological: She is alert and oriented to person, place, and time. She has normal strength. Coordination normal. GCS eye subscore is 4. GCS verbal subscore is 5. GCS motor subscore is 6.  Skin:  Skin is warm, dry and intact.  Psychiatric: She has a normal mood and affect.    ED Course  Procedures (including critical care time) Labs Review Labs Reviewed  BASIC METABOLIC PANEL - Abnormal; Notable for the following:    Glucose, Bld 110 (*)    Creatinine, Ser 1.13 (*)    GFR calc non Af Amer 48 (*)    GFR calc Af Amer 55 (*)    All other components within normal limits  CBC WITH DIFFERENTIAL/PLATELET - Abnormal; Notable for the following:    RBC 3.71 (*)    Hemoglobin 11.2 (*)    HCT 34.6 (*)    All other components within normal limits  PROTIME-INR - Abnormal; Notable for the following:    Prothrombin Time 22.5 (*)    INR 1.96 (*)    All other components within normal limits    Imaging Review No results found.   EKG Interpretation None      MDM   Final diagnoses:  Vertigo  Chronic tension-type headache, not intractable  Peripheral neuropathy   The patient presents today with chronic complaints. She is well appearance. Her vital signs are stable. Diagnostic evaluation does not show any acute anomalies. At this time regarding the patient's vertigo feel she is safe for refill of her Antivert and follow-up as an outpatient. There are no acute symptoms to suggest a CVA\TIA, aneurysm or infection.    Arby BarretteMarcy Roann Merk, MD 02/01/15 [phone]

## 2015-02-07 ENCOUNTER — Other Ambulatory Visit: Payer: Self-pay | Admitting: Family Medicine

## 2015-02-10 ENCOUNTER — Telehealth: Payer: Self-pay | Admitting: Family Medicine

## 2015-02-10 NOTE — Telephone Encounter (Signed)
Patient is calling because she needs a refill on traMADol (ULTRAM) 50 MG tablet and lisinopril (PRINIVIL,ZESTRIL) 20 MG tablet until her appointment on the 31st. Please contact the patient once it is complete. Thank you, [name], ASA

## 2015-02-11 ENCOUNTER — Encounter (HOSPITAL_COMMUNITY): Payer: Self-pay | Admitting: *Deleted

## 2015-02-11 ENCOUNTER — Encounter: Payer: Self-pay | Admitting: Family Medicine

## 2015-02-11 ENCOUNTER — Emergency Department (HOSPITAL_COMMUNITY)
Admission: EM | Admit: 2015-02-11 | Discharge: 2015-02-11 | Disposition: A | Discharge status: Home / Self Care | Payer: Medicare Other | Attending: Emergency Medicine | Admitting: Emergency Medicine

## 2015-02-11 DIAGNOSIS — Z86711 Personal history of pulmonary embolism: Secondary | ICD-10-CM | POA: Insufficient documentation

## 2015-02-11 DIAGNOSIS — H409 Unspecified glaucoma: Secondary | ICD-10-CM | POA: Diagnosis not present

## 2015-02-11 DIAGNOSIS — I10 Essential (primary) hypertension: Secondary | ICD-10-CM | POA: Diagnosis not present

## 2015-02-11 DIAGNOSIS — Z7901 Long term (current) use of anticoagulants: Secondary | ICD-10-CM | POA: Insufficient documentation

## 2015-02-11 DIAGNOSIS — F419 Anxiety disorder, unspecified: Secondary | ICD-10-CM | POA: Diagnosis not present

## 2015-02-11 DIAGNOSIS — Z862 Personal history of diseases of the blood and blood-forming organs and certain disorders involving the immune mechanism: Secondary | ICD-10-CM | POA: Insufficient documentation

## 2015-02-11 DIAGNOSIS — R011 Cardiac murmur, unspecified: Secondary | ICD-10-CM | POA: Diagnosis not present

## 2015-02-11 DIAGNOSIS — Z7952 Long term (current) use of systemic steroids: Secondary | ICD-10-CM | POA: Insufficient documentation

## 2015-02-11 DIAGNOSIS — G8929 Other chronic pain: Secondary | ICD-10-CM | POA: Insufficient documentation

## 2015-02-11 DIAGNOSIS — R519 Headache, unspecified: Secondary | ICD-10-CM

## 2015-02-11 DIAGNOSIS — J45909 Unspecified asthma, uncomplicated: Secondary | ICD-10-CM | POA: Insufficient documentation

## 2015-02-11 DIAGNOSIS — Z7951 Long term (current) use of inhaled steroids: Secondary | ICD-10-CM | POA: Insufficient documentation

## 2015-02-11 DIAGNOSIS — R51 Headache: Secondary | ICD-10-CM | POA: Insufficient documentation

## 2015-02-11 DIAGNOSIS — K219 Gastro-esophageal reflux disease without esophagitis: Secondary | ICD-10-CM | POA: Insufficient documentation

## 2015-02-11 DIAGNOSIS — F209 Schizophrenia, unspecified: Secondary | ICD-10-CM | POA: Diagnosis not present

## 2015-02-11 DIAGNOSIS — F329 Major depressive disorder, single episode, unspecified: Secondary | ICD-10-CM | POA: Diagnosis not present

## 2015-02-11 DIAGNOSIS — Z79899 Other long term (current) drug therapy: Secondary | ICD-10-CM | POA: Insufficient documentation

## 2015-02-11 DIAGNOSIS — Z8701 Personal history of pneumonia (recurrent): Secondary | ICD-10-CM | POA: Diagnosis not present

## 2015-02-11 DIAGNOSIS — E785 Hyperlipidemia, unspecified: Secondary | ICD-10-CM | POA: Insufficient documentation

## 2015-02-11 DIAGNOSIS — Z872 Personal history of diseases of the skin and subcutaneous tissue: Secondary | ICD-10-CM | POA: Diagnosis not present

## 2015-02-11 DIAGNOSIS — Z86718 Personal history of other venous thrombosis and embolism: Secondary | ICD-10-CM | POA: Diagnosis not present

## 2015-02-11 DIAGNOSIS — M1990 Unspecified osteoarthritis, unspecified site: Secondary | ICD-10-CM | POA: Diagnosis not present

## 2015-02-11 MED ORDER — METOCLOPRAMIDE HCL 5 MG/ML IJ SOLN
10.0000 mg | Freq: Once | INTRAMUSCULAR | Status: AC
  Administered 2015-02-11: 10 mg via INTRAVENOUS
  Filled 2015-02-11: qty 2

## 2015-02-11 MED ORDER — DIPHENHYDRAMINE HCL 50 MG/ML IJ SOLN
25.0000 mg | Freq: Once | INTRAMUSCULAR | Status: AC
  Administered 2015-02-11: 25 mg via INTRAVENOUS
  Filled 2015-02-11: qty 1

## 2015-02-11 MED ORDER — MECLIZINE HCL 25 MG PO TABS
25.0000 mg | ORAL_TABLET | Freq: Once | ORAL | Status: AC
  Administered 2015-02-11: 25 mg via ORAL
  Filled 2015-02-11: qty 1

## 2015-02-11 MED ORDER — TRAMADOL HCL 50 MG PO TABS
50.0000 mg | ORAL_TABLET | Freq: Once | ORAL | Status: AC
  Administered 2015-02-11: 50 mg via ORAL
  Filled 2015-02-11: qty 1

## 2015-02-11 MED ORDER — DEXAMETHASONE SODIUM PHOSPHATE 10 MG/ML IJ SOLN
10.0000 mg | Freq: Once | INTRAMUSCULAR | Status: AC
  Administered 2015-02-11: 10 mg via INTRAVENOUS
  Filled 2015-02-11: qty 1

## 2015-02-11 NOTE — ED Provider Notes (Signed)
CSN: [id number]     Arrival date & time 02/11/15  [id number] History   First MD Initiated Contact with Patient 02/11/15 1012     Chief Complaint  Patient presents with  . Foot Pain  . Headache     (Consider location/radiation/quality/duration/timing/severity/associated sxs/prior Treatment) Patient is a 74 y.o. female presenting with headaches. The history is provided by the patient.  Headache Pain location:  Frontal Quality:  Dull Radiates to:  Does not radiate Onset quality:  Gradual Duration: 6 months. Timing:  Constant Progression:  Worsening Chronicity:  Chronic Similar to prior headaches: yes   Relieved by:  Nothing Worsened by:  Nothing Associated symptoms: no abdominal pain, no fever and no vomiting     Past Medical History  Diagnosis Date  . Depression   . Schizophrenia   . Hyperlipidemia   . Hypertension   . Chronic pain     "over my whole body" (03/30/2013)  . 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 DVT (deep venous thrombosis)     per 01/17/10 d/c summary- "remote hx of dvt"?  . Iron deficiency anemia   . Glaucoma   . Hemorrhoids   . Asthma   . Anxiety   . GERD (gastroesophageal reflux disease)   . Psoriasis 04/29/2012    New onset evaluated by Dr Marylou FlesherWilliam Huang, [facility]  Rx 0.1% Tacrolimus ointment  . Complication of anesthesia     "because I have sleep apnea" (03/30/2013)  . Heart murmur   . Chest pain, exertional   . Chronic bronchitis   . Sleep apnea     "waiting on my CPAP" (03/30/2013)  . Pneumonia     "a few times" (03/30/2013)  . Exertional shortness of breath     "& sometimes when laying down" (03/30/2013)  . Daily headache     "last 2 months" (03/30/2013)  . Arthritis     "joints" (03/30/2013)  . Varicose veins of legs   . Psoriasis    Past Surgical History  Procedure Laterality Date  . Spinal fusion      2004  . Carpal tunnel release Bilateral   . Tubal ligation  1973  . Dilation and curettage of uterus   1970's    "once" (03/30/2013)  . 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 Joint replacement    . Vein surgery  left leg   Family History  Problem Relation Age of Onset  . Diabetes Sister   . Colon cancer Brother 40  . Colon cancer Brother    History  Substance Use Topics  . Smoking status: Never Smoker   . Smokeless tobacco: Never Used  . Alcohol Use: No   OB History    No data available     Review of Systems  Constitutional: Negative for fever.  Respiratory: Negative for shortness of breath.   Cardiovascular: Negative for chest pain and leg swelling.  Gastrointestinal: Negative for vomiting and abdominal pain.  Neurological: Positive for headaches.  All other systems reviewed and are negative.     Allergies  Other  Home Medications   Prior to Admission medications   Medication Sig Start Date End Date Taking? Authorizing Provider  acetaminophen (TYLENOL) 500 MG tablet Take 2 tablets (1,000 mg total) by mouth every 8 (eight) hours as needed. Patient taking differently: Take 1,000 mg by mouth every 8 (eight) hours as needed for mild pain or moderate pain.  11/22/14   Terri Piedraourtney Forcucci, PA-C  albuterol (PROVENTIL HFA;VENTOLIN HFA) 108 (90 BASE) MCG/ACT inhaler Inhale 2 puffs into the lungs every 6 (six) hours as needed for wheezing or shortness of breath (wheezing). 12/08/14   Tyrone Nine, MD  atenolol (TENORMIN) 100 MG tablet Take 100 mg by mouth daily.    Historical Provider, MD  atorvastatin (LIPITOR) 40 MG tablet Take 40 mg by mouth daily.    Historical Provider, MD  beclomethasone (QVAR) 80 MCG/ACT inhaler Inhale 2 puffs into the lungs 2 (two) times daily as needed (wheezing).     Historical Provider, MD  cyclobenzaprine (FLEXERIL) 10 MG tablet Take 1 tablet (10 mg total) by mouth 3 (three) times daily as needed for muscle spasms. Patient not taking: Reported on 02/01/2015 12/08/14   Tyrone Nine, MD  diazepam (VALIUM) 2 MG tablet Take 1 tablet (2 mg total) by mouth every  12 (twelve) hours as needed (vertigo). Patient not taking: Reported on 02/01/2015 12/28/14   Gilda Crease, MD  esomeprazole (NEXIUM) 40 MG capsule Take 1 capsule (40 mg total) by mouth daily. 02/01/15   Arby Barrette, MD  FLUoxetine (PROZAC) 20 MG capsule Take 3 capsules (60 mg total) by mouth daily. 05/26/14   Tyrone Nine, MD  furosemide (LASIX) 20 MG tablet Take 1 tablet (20 mg total) by mouth daily. Patient taking differently: Take 20 mg by mouth as needed for fluid.  10/29/14   Tyrone Nine, MD  furosemide (LASIX) 20 MG tablet Take 20 mg by mouth daily as needed for fluid (in legs).    Historical Provider, MD  gabapentin (NEURONTIN) 400 MG capsule Take 1 capsule (400 mg total) by mouth 3 (three) times daily. 12/08/14   Tyrone Nine, MD  hydrochlorothiazide (HYDRODIURIL) 25 MG tablet Take 25 mg by mouth daily.    Historical Provider, MD  hydroxypropyl methylcellulose / hypromellose (ISOPTO TEARS / GONIOVISC) 2.5 % ophthalmic solution Place 1 drop into both eyes 3 (three) times daily as needed for dry eyes.    Historical Provider, MD  hydrOXYzine (ATARAX/VISTARIL) 10 MG tablet Take 1 tablet (10 mg total) by mouth at bedtime as needed for itching (itching). 10/29/14   Tyrone Nine, MD  lisinopril (PRINIVIL,ZESTRIL) 20 MG tablet Take 1 tablet (20 mg total) by mouth daily. 12/08/14   Tyrone Nine, MD  meclizine (ANTIVERT) 25 MG tablet Take 1 tablet (25 mg total) by mouth 3 (three) times daily as needed for dizziness. 02/01/15   Arby Barrette, MD  methocarbamol (ROBAXIN) 500 MG tablet Take 1 tablet (500 mg total) by mouth 3 (three) times daily between meals as needed. Patient not taking: Reported on 02/01/2015 12/12/14   Rolland Porter, MD  omeprazole (PRILOSEC) 40 MG capsule Take 1 capsule (40 mg total) by mouth daily. 08/31/14   Arby Barrette, MD  silver sulfADIAZINE (SILVADENE) 1 % cream APPLY TOPICALLY EVERY DAY Patient taking differently: APPLY TOPICALLY EVERY DAY BID on legs for dryness 12/10/14   Tyrone Nine, MD  tacrolimus (PROTOPIC) 0.1 % ointment Apply 1 application topically 3 (three) times daily as needed (for legs). Apply to affected areas daily PRN 03/01/14 03/01/15  Historical Provider, MD  traMADol (ULTRAM) 50 MG tablet Take 1 tablet (50 mg total) by mouth every 6 (six) hours as needed. 01/26/15   Harle Battiest, NP  triamcinolone cream (KENALOG) 0.1 % Apply 1 application topically 2 (two) times daily as needed (for legs). 10/29/14   Tyrone Nine, MD  warfarin (COUMADIN) 5 MG tablet TAKE 1  TABLET BY MOUTH EVERY DAY 10/18/14   Levert FeinsteinJames M Granfortuna, MD   BP 155/78 mmHg  Pulse 57  Temp(Src) 98.2 F (36.8 C) (Oral)  Resp 18  SpO2 98%  LMP 11/22/1996 Physical Exam  Constitutional: She is oriented to person, place, and time. She appears well-developed and well-nourished. No distress.  HENT:  Head: Normocephalic and atraumatic.  Mouth/Throat: Oropharynx is clear and moist.  Eyes: EOM are normal. Pupils are equal, round, and reactive to light.  Neck: Normal range of motion. Neck supple.  Cardiovascular: Normal rate and regular rhythm.  Exam reveals no friction rub.   No murmur heard. Pulmonary/Chest: Effort normal and breath sounds normal. No respiratory distress. She has no wheezes. She has no rales.  Abdominal: Soft. She exhibits no distension. There is no tenderness. There is no rebound.  Musculoskeletal: Normal range of motion. She exhibits no edema.  Neurological: She is alert and oriented to person, place, and time.  Skin: No rash noted. She is not diaphoretic.  Nursing note and vitals reviewed.   ED Course  Procedures (including critical care time) Labs Review Labs Reviewed - No data to display  Imaging Review No results found.   EKG Interpretation None      MDM   Final diagnoses:  Acute nonintractable headache, unspecified headache type  Chronic pain    74 year old female here with headache for the past 6 months. She says she is out of her pain medicines  including tramadol she's been taking only gabapentin. Reviewed notes in the computer show that she called family medicine office today and was told she would not be prescribing narcotics. She reports chronic pain, chronic headache for 6 months. She reports she normally gets a cocktail for this headache. Normal neurologic status. Denies CP, Vomiting, abdominal pain. Well-appearing. No need for labs.  Doing well after a migraine cocktail. Stable for discharge.  Elwin MochaBlair Shamanda Len, MD 02/11/15 [phone]

## 2015-02-11 NOTE — Progress Notes (Unsigned)
[name] [name] was just seen at the hospital this morning for back pain. They wanted to prescribe her tramadol for the pain, however they instructed her to request it from her PCP. She is here at this moment to retrieved this medication. I informed her of the previous message regarding the fact that no more narcotics will be prescribed from this office, she asked what else could she do so I instructed her to keep her appointment on the 31st with her doctor and discuss other options for her pain then. She expressed understanding. Thank you, [name], ASA

## 2015-02-11 NOTE — ED Notes (Signed)
Pt reports chronic foot pain, more severe in right foot. Also having migraine, no relief with pain meds at home.

## 2015-02-11 NOTE — Telephone Encounter (Signed)
Noted refill request for tramadol and lisinopril.  - As [name] [name] must be abundantly aware: She is not to be prescribed any narcotics of any kind from this office. No refill will be given. Last fill was 15 tabs Rx by Harle BattiestElizabeth Tysinger who I presume is either a new PCP or an ED provider.  - A 12 month supply of lisinopril was recently prescribed for her so no refill will be given.

## 2015-02-11 NOTE — Progress Notes (Signed)
Will forward to MD to make him aware. Stephanee Barcomb,CMA  

## 2015-02-11 NOTE — Discharge Instructions (Signed)

## 2015-02-13 ENCOUNTER — Emergency Department (HOSPITAL_COMMUNITY)
Admission: EM | Admit: 2015-02-13 | Discharge: 2015-02-13 | Disposition: A | Discharge status: Home / Self Care | Payer: Medicare Other | Attending: Emergency Medicine | Admitting: Emergency Medicine

## 2015-02-13 ENCOUNTER — Encounter (HOSPITAL_COMMUNITY): Payer: Self-pay | Admitting: *Deleted

## 2015-02-13 DIAGNOSIS — I6789 Other cerebrovascular disease: Secondary | ICD-10-CM | POA: Diagnosis not present

## 2015-02-13 DIAGNOSIS — Z872 Personal history of diseases of the skin and subcutaneous tissue: Secondary | ICD-10-CM | POA: Diagnosis not present

## 2015-02-13 DIAGNOSIS — F419 Anxiety disorder, unspecified: Secondary | ICD-10-CM | POA: Insufficient documentation

## 2015-02-13 DIAGNOSIS — Z862 Personal history of diseases of the blood and blood-forming organs and certain disorders involving the immune mechanism: Secondary | ICD-10-CM | POA: Diagnosis not present

## 2015-02-13 DIAGNOSIS — H409 Unspecified glaucoma: Secondary | ICD-10-CM | POA: Diagnosis not present

## 2015-02-13 DIAGNOSIS — F329 Major depressive disorder, single episode, unspecified: Secondary | ICD-10-CM | POA: Diagnosis not present

## 2015-02-13 DIAGNOSIS — E785 Hyperlipidemia, unspecified: Secondary | ICD-10-CM | POA: Diagnosis not present

## 2015-02-13 DIAGNOSIS — M549 Dorsalgia, unspecified: Secondary | ICD-10-CM

## 2015-02-13 DIAGNOSIS — Z8701 Personal history of pneumonia (recurrent): Secondary | ICD-10-CM | POA: Insufficient documentation

## 2015-02-13 DIAGNOSIS — M79671 Pain in right foot: Secondary | ICD-10-CM | POA: Insufficient documentation

## 2015-02-13 DIAGNOSIS — Z79899 Other long term (current) drug therapy: Secondary | ICD-10-CM | POA: Insufficient documentation

## 2015-02-13 DIAGNOSIS — Z86711 Personal history of pulmonary embolism: Secondary | ICD-10-CM | POA: Diagnosis not present

## 2015-02-13 DIAGNOSIS — M545 Low back pain: Secondary | ICD-10-CM | POA: Insufficient documentation

## 2015-02-13 DIAGNOSIS — Z86718 Personal history of other venous thrombosis and embolism: Secondary | ICD-10-CM | POA: Diagnosis not present

## 2015-02-13 DIAGNOSIS — I10 Essential (primary) hypertension: Secondary | ICD-10-CM | POA: Diagnosis not present

## 2015-02-13 DIAGNOSIS — J45909 Unspecified asthma, uncomplicated: Secondary | ICD-10-CM | POA: Diagnosis not present

## 2015-02-13 DIAGNOSIS — Z7901 Long term (current) use of anticoagulants: Secondary | ICD-10-CM | POA: Insufficient documentation

## 2015-02-13 DIAGNOSIS — R011 Cardiac murmur, unspecified: Secondary | ICD-10-CM | POA: Diagnosis not present

## 2015-02-13 DIAGNOSIS — K219 Gastro-esophageal reflux disease without esophagitis: Secondary | ICD-10-CM | POA: Diagnosis not present

## 2015-02-13 DIAGNOSIS — M79673 Pain in unspecified foot: Secondary | ICD-10-CM

## 2015-02-13 DIAGNOSIS — M79672 Pain in left foot: Secondary | ICD-10-CM | POA: Insufficient documentation

## 2015-02-13 DIAGNOSIS — G8929 Other chronic pain: Secondary | ICD-10-CM | POA: Diagnosis not present

## 2015-02-13 DIAGNOSIS — R42 Dizziness and giddiness: Secondary | ICD-10-CM | POA: Diagnosis not present

## 2015-02-13 MED ORDER — TRAMADOL HCL 50 MG PO TABS
50.0000 mg | ORAL_TABLET | Freq: Once | ORAL | Status: AC
  Administered 2015-02-13: 50 mg via ORAL
  Filled 2015-02-13: qty 1

## 2015-02-13 NOTE — Discharge Instructions (Signed)
It was our pleasure to provide your ER care today - we hope that you feel better.  You may try taking tylenol, and either motrin or aleve as need for pain.  Follow up with primary care doctor in the next few days - discuss refill of meds/pain meds with them during that visit.  Also have your blood pressure rechecked then, as it is high today.  Return to ER if worse, new symptoms, fevers, medical emergency, other concern.  You were given pain medication in the ER - no driving for the next 4 hours.     Back Pain, Adult Low back pain is very common. About 1 in 5 people have back pain.The cause of low back pain is rarely dangerous. The pain often gets better over time.About half of people with a sudden onset of back pain feel better in just 2 weeks. About 8 in 10 people feel better by 6 weeks.  CAUSES Some common causes of back pain include:  Strain of the muscles or ligaments supporting the spine.  Wear and tear (degeneration) of the spinal discs.  Arthritis.  Direct injury to the back. DIAGNOSIS Most of the time, the direct cause of low back pain is not known.However, back pain can be treated effectively even when the exact cause of the pain is unknown.Answering your caregiver's questions about your overall health and symptoms is one of the most accurate ways to make sure the cause of your pain is not dangerous. If your caregiver needs more information, he or she may order lab work or imaging tests (X-rays or MRIs).However, even if imaging tests show changes in your back, this usually does not require surgery. HOME CARE INSTRUCTIONS For many people, back pain returns.Since low back pain is rarely dangerous, it is often a condition that people can learn to [facility] their own.   Remain active. It is stressful on the back to sit or stand in one place. Do not sit, drive, or stand in one place for more than 30 minutes at a time. Take short walks on level surfaces as soon as pain  allows.Try to increase the length of time you walk each day.  Do not stay in bed.Resting more than 1 or 2 days can delay your recovery.  Do not avoid exercise or work.Your body is made to move.It is not dangerous to be active, even though your back may hurt.Your back will likely heal faster if you return to being active before your pain is gone.  Pay attention to your body when you bend and lift. Many people have less discomfortwhen lifting if they bend their knees, keep the load close to their bodies,and avoid twisting. Often, the most comfortable positions are those that put less stress on your recovering back.  Find a comfortable position to sleep. Use a firm mattress and lie on your side with your knees slightly bent. If you lie on your back, put a pillow under your knees.  Only take over-the-counter or prescription medicines as directed by your caregiver. Over-the-counter medicines to reduce pain and inflammation are often the most helpful.Your caregiver may prescribe muscle relaxant drugs.These medicines help dull your pain so you can more quickly return to your normal activities and healthy exercise.  Put ice on the injured area.  Put ice in a plastic bag.  Place a towel between your skin and the bag.  Leave the ice on for 15-20 minutes, 03-04 times a day for the first 2 to 3 days. After that,  ice and heat may be alternated to reduce pain and spasms.  Ask your caregiver about trying back exercises and gentle massage. This may be of some benefit.  Avoid feeling anxious or stressed.Stress increases muscle tension and can worsen back pain.It is important to recognize when you are anxious or stressed and learn ways to manage it.Exercise is a great option. SEEK MEDICAL CARE IF:  You have pain that is not relieved with rest or medicine.  You have pain that does not improve in 1 week.  You have new symptoms.  You are generally not feeling well. SEEK IMMEDIATE MEDICAL CARE  IF:   You have pain that radiates from your back into your legs.  You develop new bowel or bladder control problems.  You have unusual weakness or numbness in your arms or legs.  You develop nausea or vomiting.  You develop abdominal pain.  You feel faint. Document Released: 09/10/2005 Document Revised: 03/11/2012 Document Reviewed: 01/12/2014 [facility] Patient Information 2015 [place], [place]. This information is not intended to replace advice given to you by your health care provider. Make sure you discuss any questions you have with your health care provider.    Plantar Fasciitis Plantar fasciitis is a common condition that causes foot pain. It is soreness (inflammation) of the band of tough fibrous tissue on the bottom of the foot that runs from the heel bone (calcaneus) to the ball of the foot. The cause of this soreness may be from excessive standing, poor fitting shoes, running on hard surfaces, being overweight, having an abnormal walk, or overuse (this is common in runners) of the painful foot or feet. It is also common in aerobic exercise dancers and ballet dancers. SYMPTOMS  Most people with plantar fasciitis complain of:  Severe pain in the morning on the bottom of their foot especially when taking the first steps out of bed. This pain recedes after a few minutes of walking.  Severe pain is experienced also during walking following a long period of inactivity.  Pain is worse when walking barefoot or up stairs DIAGNOSIS   Your caregiver will diagnose this condition by examining and feeling your foot.  Special tests such as X-rays of your foot, are usually not needed. PREVENTION   Consult a sports medicine professional before beginning a new exercise program.  Walking programs offer a good workout. With walking there is a lower chance of overuse injuries common to runners. There is less impact and less jarring of the joints.  Begin all new exercise programs slowly. If  problems or pain develop, decrease the amount of time or distance until you are at a comfortable level.  Wear good shoes and replace them regularly.  Stretch your foot and the heel cords at the back of the ankle (Achilles tendon) both before and after exercise.  Run or exercise on even surfaces that are not hard. For example, asphalt is better than pavement.  Do not run barefoot on hard surfaces.  If using a treadmill, vary the incline.  Do not continue to workout if you have foot or joint problems. Seek professional help if they do not improve. HOME CARE INSTRUCTIONS   Avoid activities that cause you pain until you recover.  Use ice or cold packs on the problem or painful areas after working out.  Only take over-the-counter or prescription medicines for pain, discomfort, or fever as directed by your caregiver.  Soft shoe inserts or athletic shoes with air or gel sole cushions may be helpful.  If problems continue or become more severe, consult a sports medicine caregiver or your own health care provider. Cortisone is a potent anti-inflammatory medication that may be injected into the painful area. You can discuss this treatment with your caregiver. MAKE SURE YOU:   Understand these instructions.  Will watch your condition.  Will get help right away if you are not doing well or get worse. Document Released: 06/05/2001 Document Revised: 12/03/2011 Document Reviewed: 08/04/2008 [facility] Patient Information 2015 [place], [place]. This information is not intended to replace advice given to you by your health care provider. Make sure you discuss any questions you have with your health care provider.   Chronic Pain Chronic pain can be defined as pain that is off and on and lasts for 3-6 months or longer. Many things cause chronic pain, which can make it difficult to make a diagnosis. There are many treatment options available for chronic pain. However, finding a treatment that works well  for you may require trying various approaches until the right one is found. Many people benefit from a combination of two or more types of treatment to control their pain. SYMPTOMS  Chronic pain can occur anywhere in the body and can range from mild to very severe. Some types of chronic pain include:  Headache.  Low back pain.  Cancer pain.  Arthritis pain.  Neurogenic pain. This is pain resulting from damage to nerves. People with chronic pain may also have other symptoms such as:  Depression.  Anger.  Insomnia.  Anxiety. DIAGNOSIS  Your health care provider will help diagnose your condition over time. In many cases, the initial focus will be on excluding possible conditions that could be causing the pain. Depending on your symptoms, your health care provider may order tests to diagnose your condition. Some of these tests may include:   Blood tests.   CT scan.   MRI.   X-rays.   Ultrasounds.   Nerve conduction studies.  You may need to see a specialist.  TREATMENT  Finding treatment that works well may take time. You may be referred to a pain specialist. He or she may prescribe medicine or therapies, such as:   Mindful meditation or yoga.  Shots (injections) of numbing or pain-relieving medicines into the spine or area of pain.  Local electrical stimulation.  Acupuncture.   Massage therapy.   Aroma, color, light, or sound therapy.   Biofeedback.   Working with a physical therapist to keep from getting stiff.   Regular, gentle exercise.   Cognitive or behavioral therapy.   Group support.  Sometimes, surgery may be recommended.  HOME CARE INSTRUCTIONS   Take all medicines as directed by your health care provider.   Lessen stress in your life by relaxing and doing things such as listening to calming music.   Exercise or be active as directed by your health care provider.   Eat a healthy diet and include things such as vegetables,  fruits, fish, and lean meats in your diet.   Keep all follow-up appointments with your health care provider.   Attend a support group with others suffering from chronic pain. SEEK MEDICAL CARE IF:   Your pain gets worse.   You develop a new pain that was not there before.   You cannot tolerate medicines given to you by your health care provider.   You have new symptoms since your last visit with your health care provider.  SEEK IMMEDIATE MEDICAL CARE IF:   You feel  weak.   You have decreased sensation or numbness.   You lose control of bowel or bladder function.   Your pain suddenly gets much worse.   You develop shaking.  You develop chills.  You develop confusion.  You develop chest pain.  You develop shortness of breath.  MAKE SURE YOU:  Understand these instructions.  Will watch your condition.  Will get help right away if you are not doing well or get worse. Document Released: 06/02/2002 Document Revised: 05/13/2013 Document Reviewed: 03/06/2013 [facility] Patient Information 2015 [place], [place]. This information is not intended to replace advice given to you by your health care provider. Make sure you discuss any questions you have with your health care provider.     Emergency Department Resource Guide 1) Find a Doctor and Pay Out of Pocket Although you won't have to find out who is covered by your insurance plan, it is a good idea to ask around and get recommendations. You will then need to call the office and see if the doctor you have chosen will accept you as a new patient and what types of options they offer for patients who are self-pay. Some doctors offer discounts or will set up payment plans for their patients who do not have insurance, but you will need to ask so you aren't surprised when you get to your appointment.  2) Contact Your Local Health Department Not all health departments have doctors that can see patients for sick visits, but  many do, so it is worth a call to see if yours does. If you don't know where your local health department is, you can check in your phone book. The CDC also has a tool to help you locate your state's health department, and many state websites also have listings of all of their local health departments.  3) Find a Walk-in Clinic If your illness is not likely to be very severe or complicated, you may want to try a walk in clinic. These are popping up all over the country in pharmacies, drugstores, and shopping centers. They're usually staffed by nurse practitioners or physician assistants that have been trained to treat common illnesses and complaints. They're usually fairly quick and inexpensive. However, if you have serious medical issues or chronic medical problems, these are probably not your best option.  No Primary Care Doctor: - Call Health Connect at  [phone] - they can help you locate a primary care doctor that  accepts your insurance, provides certain services, etc. - Physician Referral Service- [phone]  Chronic Pain Problems: Organization         Address  Phone   Notes  [facility]  [phone] Patients need to be referred by their primary care doctor.   Medication Assistance: Organization         Address  Phone   Notes  [facility] Medication [facility] [address] [place] [place], [place] [address] [phone] --Must be a resident of [facility] -- Must have NO insurance coverage whatsoever (no Medicaid/ Medicare, etc.) -- The pt. MUST have a primary care doctor that directs their care regularly and follows them in the community   [facility]  [phone]   [employer]  [phone]    Agencies that provide inexpensive medical care: Organization         Address  Phone   Notes  [facility]  [phone]   [facility]    (  [phone]   [facility] [address] [place], [place] [address] [phone]   [facility] [address] [place], [place] [phone]   Planned Parenthood    [phone]   [facility]    [phone]   [facility]  [address], [place] Phone:  [phone], Fax:  [phone] Hours of Operation:  9 am - 6 pm, M-F.  Also accepts Medicaid/Medicare and self-pay.  [facility]  [address], [place] Phone: [phone], Fax: [phone]. Hours of Operation:  8:30 am - 5:30 pm, M-F.  Also accepts Medicaid and self-pay.  [facility] [place] [address], [place] Phone: [phone]   [facility] [address] Natasha Bence [place], [place] [phone], Ext. [phone] Mondays & Thursdays: 7-9 AM.  First 15 patients are seen on a first come, first serve basis.    Medicaid-accepting [facility] Providers:  Organization         Address  Phone   Notes  [facility] [address], [place] [phone] Also accepts self-pay patients.  [facility] [address] Laurell Josephs [place], [place]  [phone]   [facility] [address], [place] [phone]   [facility] [address] [place] [phone]   Renaye Rakers [address], [place]   [phone] Only accepts [place] Access [place] patients after they have their name applied to their card.   Self-Pay (no insurance) in [facility]:  Organization         Address  Phone   Notes  Sickle Cell Patients, [facility] [address] [place], [place] [phone]   [facility] [address] [place], [place] [phone]   [facility] [place]  [address], [place] [phone]   [facility]/Dr. Osei-Bonsu  [address] [place] or  [address], [place] [phone] Phone number for both [place] and [place] locations is the same.  [facility]gent Medical and [facility] [address], [place] [phone]   [facility] [address], [place] or [address] [phone]   [facility] [address] [place] [phone], phone; [phone], fax Sees patients 1st and 3rd Saturday of every month.  Must not qualify for public or private insurance (i.e. Medicaid, Medicare, [place] Health Choice, Veterans' Benefits)  Household income should be no more than 200% of the poverty level The clinic cannot treat you if you are pregnant or think you are pregnant  Sexually transmitted diseases are not treated at the clinic.    Dental Care: Organization         Address  Phone  Notes  [facility] Department of [facility] [address] [place], [place] [phone] Accepts children up to age 28 who are enrolled in [place] or [place] Health Choice; pregnant women with a Medicaid card; and children who have applied for Medicaid or  Health Choice, but were declined, whose parents can pay a reduced fee at time of service.  [facility] Department of [facility]  [address], [place] [phone] Accepts children up  to age 72 who are enrolled in Medicaid or [place] Health Choice; pregnant women with a Medicaid card; and children who have applied for Medicaid or [place] Health Choice, but were declined, whose parents can pay a reduced fee at time of service.  [facility]  [address], [place] [phone] Patients are seen by appointment only. Walk-ins are not accepted. [facility] will see patients 72 years of age and older. Monday - Tuesday (8am-5pm) Most Wednesdays (8:30-5pm) $30 per visit, cash only  [facility]  [address], [facility]  [place] [phone] Patients are seen by appointment only. Walk-ins are not accepted. [facility] will see patients 72 years of age and older. One Wednesday Evening (Monthly: Volunteer Based).  $30 per visit, cash only  [facility]  [phone]) [phone] for adults; Children under age 644, call Graduate Pediatric Dentistry at [phone]. Children aged 514-14, please call [phone]) [phone] to request a pediatric application.  Dental services are provided in all areas of dental care including fillings, crowns and bridges, complete and partial dentures, implants, gum treatment, root canals, and extractions. Preventive care is also provided. Treatment is provided to both adults and children. Patients are selected via a lottery and there is often a waiting list.   [facility] [address], [place]  [phone] [email or web address]   [facility] [address], [place], [place] [phone], Ext. [phone] Second and Fourth Thursday of each month, opens at 6:30 AM; Clinic ends at 9 AM.  Patients are seen on a first-come first-served basis, and a limited number are seen during each clinic.   [facility]  [address], [place], [place] [phone]   Eligibility Requirements You must have lived in [place], [place], or [place] counties for at least the last three months.   You cannot be eligible for state or federal sponsored [place]healthcare insurance, including CIG[employer]eterans Administration, [place], or [employer]icare.   You generally cannot be eligible for healthcare insurance through your employer.    How to apply: Eligibility screenings are held every Tuesday and Wednesday afternoon from 1:00 pm until 4:00 pm. You do not need an appointment for the interview!  [facility] [address], [place], [place] [phone]   [facility]  [phone]   [facility]  [phone]   [facility]  [phone]    Behavioral Health Resources in the Community: Intensive Outpatient Programs Organization         Address  Phone  Notes  [facility] [address], [place], [place] [phone]   [facility] [address], [place], [place] [phone]   [facility] [address], [place], [place]  [phone]   [facility] [address],  [place], [place] [phone] or [phone]   Substance Abuse Resources Organization         Address  Phone  Notes  [facility]  [phone]   [facility]  [phone]   [facility]  [phone]   [facility]  [phone]   Residential & Outpatient Substance Abuse Program  [phone]   Psychological Services Organization         Address  Phone  Notes  [facility]  [phone]   [facility]  [phone]- [phone]   [facility]  [address] [place] [phone] or [phone]    Mobile Crisis Teams Organization         Address  Phone  Notes  [facility], Mobile Crisis Care Unit  [phone]   [facility]  [address] [place], [place] [phone]   Doristine Locks [address] [place] [phone]    Self-Help/Support Groups Organization         Address  Phone             Notes  [facility] [place] - variety of support groups  [phone]- [phone] Call for more information  Narcotics Anonymous (NA), [facility] [address], [employer] [place]  2 meetings at this location   Statistician         Address  Phone  Notes  [facility] [address],    [place]  [phone]   [facility]  [address], [place] [address], [place], [place] [phone]   [facility] [address] [place], [place] [phone]  Admissions: 8am-3pm M-F  [facility] [address],    [place], [place] [phone]   [facility] [address] [place], [place], [place] [phone]   [facility] [address]  [place], [place] [phone]   [facility] - Intensive Outpatient [address] Laurell Josephs [address], [place], [place] [phone]   [facility] ([facility] [address] [place]  [place], [place] [phone] or [phone]   [facility] ([facility]) [address], [place], [place] [phone] Accepts Medicaid  [facility] [address],  [place] [phone] Substance Abuse/Addiction Treatment   [facility] Organization         Address  Phone  Notes  [facility]  [phone]   Angie Fava, PhD [address] Ervin Knack [place], [place]   [phone] or [phone]   [facility]   [address] [place], [place] [phone]   [facility] [address] [place], [place] [phone] Insurance/Medicaid/sponsorship through [facility] [address], [address]                                    [place], [place] [phone] Therapy/tele-psych/case  [facility] [address], [place] [phone]    Dr. Lolly Mustache  [phone]   [facility]  [employer] [facility] Dept. 1) [address], [place] 2) [address], [place] 3)  [address], [place] [phone]  [phone]   [facility] Child Abuse Hotline [phone] or [phone] (After Hours)

## 2015-02-13 NOTE — ED Provider Notes (Addendum)
CSN: [id number]     Arrival date & time 02/13/15  1734 History   First MD Initiated Contact with Patient 02/13/15 1744     Chief Complaint  Patient presents with  . Dizziness     (Consider location/radiation/quality/duration/timing/severity/associated sxs/prior Treatment) Patient is a 74 y.o. female presenting with dizziness. The history is provided by the patient.  Dizziness Associated symptoms: no chest pain, no diarrhea, no headaches, no shortness of breath, no vomiting and no weakness   Patient w hx chronic pain syndrome, myalgias, chronic back pain, htn, c/o being out of her pain medication in the past couple weeks, and c/o that pcp will no longer fill her pain medication. Patient indicates her pain is 'all over entire body', and especially makes note of chronic back pain, and chronic bilateral heel pain.  Pt states is looking for a new primary care doctor, and is considering [facility] and [facility] [employer] [facility]. Patient also repetitively states she wants 'a black doctor' who will understand her chronic pain better.  States she has been referred to pain management several times in the past, however had only a single visit there, and has no appointment or plan to return. Denies any acute or abrupt change in her pain. No recent fall or injury. Pt indicates has hx dizziness/vertigo, but denies recent exacerbation. Currently no room spinning, no faintness or lightheadedness. Is ambulating per normal/baseline. No new numbness/weakness. No fever or chills. Normal appetite. No nvd. States compliant w other meds.       Past Medical History  Diagnosis Date  . Depression   . Schizophrenia   . Hyperlipidemia   . Hypertension   . Chronic pain     "over my whole body" (03/30/2013)  . 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 DVT (deep venous thrombosis)     per 01/17/10 d/c summary- "remote hx of dvt"?  . Iron deficiency anemia   . Glaucoma   . Hemorrhoids   . Asthma   .  Anxiety   . GERD (gastroesophageal reflux disease)   . Psoriasis 04/29/2012    New onset evaluated by Dr Marylou Flesher, Dermatology, [facility] 6/13  Rx 0.1% Tacrolimus ointment  . Complication of anesthesia     "because I have sleep apnea" (03/30/2013)  . Heart murmur   . Chest pain, exertional   . Chronic bronchitis   . Sleep apnea     "waiting on my CPAP" (03/30/2013)  . Pneumonia     "a few times" (03/30/2013)  . Exertional shortness of breath     "& sometimes when laying down" (03/30/2013)  . Daily headache     "last 2 months" (03/30/2013)  . Arthritis     "joints" (03/30/2013)  . Varicose veins of legs   . Psoriasis    Past Surgical History  Procedure Laterality Date  . Spinal fusion      2004  . Carpal tunnel release Bilateral   . Tubal ligation  1973  . Dilation and curettage of uterus  1970's    "once" (03/30/2013)  . 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 Joint replacement    . Vein surgery  left leg   Family History  Problem Relation Age of Onset  . Diabetes Sister   . Colon cancer Brother 40  . Colon cancer Brother    History  Substance Use Topics  . Smoking status: Never Smoker   . Smokeless tobacco: Never Used  . Alcohol Use:  No   OB History    No data available     Review of Systems  Constitutional: Negative for fever and chills.  HENT: Negative for sore throat.   Eyes: Negative for visual disturbance.  Respiratory: Negative for cough and shortness of breath.   Cardiovascular: Negative for chest pain and leg swelling.  Gastrointestinal: Negative for vomiting, abdominal pain and diarrhea.  Genitourinary: Negative for dysuria and flank pain.  Musculoskeletal: Positive for myalgias and back pain. Negative for neck pain.  Skin: Negative for rash.  Neurological: Positive for dizziness. Negative for syncope, speech difficulty, weakness, numbness and headaches.  Hematological: Does not bruise/bleed easily.  Psychiatric/Behavioral: Negative for confusion.       Allergies  Review of patient's allergies indicates no known allergies.  Home Medications   Prior to Admission medications   Medication Sig Start Date End Date Taking? Authorizing Provider  acetaminophen (TYLENOL) 500 MG tablet Take 2 tablets (1,000 mg total) by mouth every 8 (eight) hours as needed. Patient taking differently: Take 1,000 mg by mouth every 8 (eight) hours as needed for mild pain or moderate pain.  11/22/14   Courtney Forcucci, PA-C  albuterol (PROVENTIL HFA;VENTOLIN HFA) 108 (90 BASE) MCG/ACT inhaler Inhale 2 puffs into the lungs every 6 (six) hours as needed for wheezing or shortness of breath (wheezing). 12/08/14   Tyrone Nine, MD  atenolol (TENORMIN) 100 MG tablet Take 100 mg by mouth daily.    Historical Provider, MD  atorvastatin (LIPITOR) 40 MG tablet Take 40 mg by mouth daily.    Historical Provider, MD  beclomethasone (QVAR) 80 MCG/ACT inhaler Inhale 2 puffs into the lungs 2 (two) times daily as needed (wheezing).     Historical Provider, MD  cyclobenzaprine (FLEXERIL) 10 MG tablet Take 1 tablet (10 mg total) by mouth 3 (three) times daily as needed for muscle spasms. Patient not taking: Reported on 02/01/2015 12/08/14   Tyrone Nine, MD  diazepam (VALIUM) 2 MG tablet Take 1 tablet (2 mg total) by mouth every 12 (twelve) hours as needed (vertigo). Patient not taking: Reported on 02/01/2015 12/28/14   Gilda Crease, MD  esomeprazole (NEXIUM) 40 MG capsule Take 1 capsule (40 mg total) by mouth daily. 02/01/15   Arby Barrette, MD  FLUoxetine (PROZAC) 20 MG capsule Take 3 capsules (60 mg total) by mouth daily. 05/26/14   Tyrone Nine, MD  furosemide (LASIX) 20 MG tablet Take 1 tablet (20 mg total) by mouth daily. Patient taking differently: Take 20 mg by mouth as needed for fluid.  10/29/14   Tyrone Nine, MD  furosemide (LASIX) 20 MG tablet Take 20 mg by mouth daily as needed for fluid (in legs).    Historical Provider, MD  gabapentin (NEURONTIN) 400 MG capsule Take  1 capsule (400 mg total) by mouth 3 (three) times daily. 12/08/14   Tyrone Nine, MD  hydrochlorothiazide (HYDRODIURIL) 25 MG tablet Take 25 mg by mouth daily.    Historical Provider, MD  hydroxypropyl methylcellulose / hypromellose (ISOPTO TEARS / GONIOVISC) 2.5 % ophthalmic solution Place 1 drop into both eyes 3 (three) times daily as needed for dry eyes.    Historical Provider, MD  hydrOXYzine (ATARAX/VISTARIL) 10 MG tablet Take 1 tablet (10 mg total) by mouth at bedtime as needed for itching (itching). 10/29/14   Tyrone Nine, MD  lisinopril (PRINIVIL,ZESTRIL) 20 MG tablet Take 1 tablet (20 mg total) by mouth daily. 12/08/14   Tyrone Nine, MD  meclizine Lezlie Octave)  25 MG tablet Take 1 tablet (25 mg total) by mouth 3 (three) times daily as needed for dizziness. 02/01/15   Arby BarretteMarcy Pfeiffer, MD  methocarbamol (ROBAXIN) 500 MG tablet Take 1 tablet (500 mg total) by mouth 3 (three) times daily between meals as needed. Patient not taking: Reported on 02/01/2015 12/12/14   Rolland PorterMark James, MD  omeprazole (PRILOSEC) 40 MG capsule Take 1 capsule (40 mg total) by mouth daily. Patient not taking: Reported on 02/11/2015 08/31/14   Arby BarretteMarcy Pfeiffer, MD  silver sulfADIAZINE (SILVADENE) 1 % cream APPLY TOPICALLY EVERY DAY Patient taking differently: APPLY TOPICALLY EVERY DAY BID on legs for dryness 12/10/14   Tyrone Nineyan B Grunz, MD  tacrolimus (PROTOPIC) 0.1 % ointment Apply 1 application topically 3 (three) times daily as needed (for legs). Apply to affected areas daily PRN 03/01/14 03/01/15  Historical Provider, MD  traMADol (ULTRAM) 50 MG tablet Take 1 tablet (50 mg total) by mouth every 6 (six) hours as needed. 01/26/15   Harle BattiestElizabeth Tysinger, NP  triamcinolone cream (KENALOG) 0.1 % Apply 1 application topically 2 (two) times daily as needed (for legs). Patient not taking: Reported on 02/11/2015 10/29/14   Tyrone Nineyan B Grunz, MD  warfarin (COUMADIN) 5 MG tablet TAKE 1 TABLET BY MOUTH EVERY DAY 10/18/14   Levert FeinsteinJames M Granfortuna, MD   BP 162/81 mmHg   Pulse 63  Temp(Src) 98.5 F (36.9 C) (Oral)  Resp 18  Ht 5\' 5"  (1.651 m)  Wt 240 lb (108.863 kg)  BMI 39.94 kg/m2  SpO2 97%  LMP 11/22/1996 Physical Exam  Constitutional: She is oriented to person, place, and time. She appears well-developed and well-nourished. No distress.  HENT:  Mouth/Throat: Oropharynx is clear and moist.  Eyes: Conjunctivae are normal. Pupils are equal, round, and reactive to light. No scleral icterus.  Neck: Neck supple. No tracheal deviation present. No thyromegaly present.  No bruit  Cardiovascular: Normal rate, regular rhythm, normal heart sounds and intact distal pulses.   Pulmonary/Chest: Effort normal and breath sounds normal. No respiratory distress.  Abdominal: Soft. Normal appearance. She exhibits no distension. There is no tenderness.  Genitourinary:  No cva tenderness  Musculoskeletal:  Mild symmetric bilateral ankle edema. CTLS spine, non tender, aligned, no step off.  No focal bony tenderness on ext exam. Distal pulses palp bil.   Neurological: She is alert and oriented to person, place, and time. No cranial nerve deficit.  No pronator drift. Motor intact bil. stre 5/5. sens grossly intact. Stands/ambulates w steady gait.   Skin: Skin is warm and dry. No rash noted. She is not diaphoretic.  Psychiatric: She has a normal mood and affect.  Nursing note and vitals reviewed.   ED Course  Procedures (including critical care time)      MDM   Pt requests pain med. Ultram po.   Reviewed nursing notes and prior charts for additional history.   Reviewed recent notes from prior ED visits and family practice/pcp.   Will provide resource guide, as well as [facility] as pt requesting new primary care provider.  Discussed chronic pain guidelines, and that refills of meds/pain meds would need to come via her primary care provider.  Pt currently without acute/new symptoms, and appears currently stable for  d/c.       Cathren LaineKevin Mayo Faulk, MD 02/13/15 [phone]

## 2015-02-13 NOTE — ED Notes (Signed)
Pt states she has been experiencing vertigo since October.  Pt states she has cronic feet pain and her primary doctor said he would not give her any more pain medication so she came back to the ER for pain.

## 2015-02-17 DIAGNOSIS — L299 Pruritus, unspecified: Secondary | ICD-10-CM | POA: Diagnosis not present

## 2015-02-17 DIAGNOSIS — G629 Polyneuropathy, unspecified: Secondary | ICD-10-CM | POA: Diagnosis not present

## 2015-02-18 ENCOUNTER — Other Ambulatory Visit: Payer: Self-pay | Admitting: Family Medicine

## 2015-02-22 ENCOUNTER — Ambulatory Visit: Payer: Medicare Other | Admitting: Family Medicine

## 2015-02-26 ENCOUNTER — Encounter (HOSPITAL_COMMUNITY): Payer: Self-pay | Admitting: Emergency Medicine

## 2015-02-26 ENCOUNTER — Emergency Department (HOSPITAL_COMMUNITY)
Admission: EM | Admit: 2015-02-26 | Discharge: 2015-02-26 | Disposition: A | Discharge status: Home / Self Care | Payer: Medicare Other | Attending: Emergency Medicine | Admitting: Emergency Medicine

## 2015-02-26 ENCOUNTER — Emergency Department (HOSPITAL_COMMUNITY): Payer: Medicare Other

## 2015-02-26 DIAGNOSIS — Z792 Long term (current) use of antibiotics: Secondary | ICD-10-CM | POA: Insufficient documentation

## 2015-02-26 DIAGNOSIS — M542 Cervicalgia: Secondary | ICD-10-CM | POA: Diagnosis not present

## 2015-02-26 DIAGNOSIS — I10 Essential (primary) hypertension: Secondary | ICD-10-CM | POA: Diagnosis not present

## 2015-02-26 DIAGNOSIS — E785 Hyperlipidemia, unspecified: Secondary | ICD-10-CM | POA: Insufficient documentation

## 2015-02-26 DIAGNOSIS — Y9389 Activity, other specified: Secondary | ICD-10-CM | POA: Diagnosis not present

## 2015-02-26 DIAGNOSIS — H409 Unspecified glaucoma: Secondary | ICD-10-CM | POA: Insufficient documentation

## 2015-02-26 DIAGNOSIS — Y9289 Other specified places as the place of occurrence of the external cause: Secondary | ICD-10-CM | POA: Diagnosis not present

## 2015-02-26 DIAGNOSIS — R011 Cardiac murmur, unspecified: Secondary | ICD-10-CM | POA: Insufficient documentation

## 2015-02-26 DIAGNOSIS — F329 Major depressive disorder, single episode, unspecified: Secondary | ICD-10-CM | POA: Diagnosis not present

## 2015-02-26 DIAGNOSIS — Y998 Other external cause status: Secondary | ICD-10-CM | POA: Diagnosis not present

## 2015-02-26 DIAGNOSIS — Z862 Personal history of diseases of the blood and blood-forming organs and certain disorders involving the immune mechanism: Secondary | ICD-10-CM | POA: Insufficient documentation

## 2015-02-26 DIAGNOSIS — G8929 Other chronic pain: Secondary | ICD-10-CM | POA: Insufficient documentation

## 2015-02-26 DIAGNOSIS — S3992XA Unspecified injury of lower back, initial encounter: Secondary | ICD-10-CM | POA: Diagnosis present

## 2015-02-26 DIAGNOSIS — W01198A Fall on same level from slipping, tripping and stumbling with subsequent striking against other object, initial encounter: Secondary | ICD-10-CM | POA: Diagnosis not present

## 2015-02-26 DIAGNOSIS — G4730 Sleep apnea, unspecified: Secondary | ICD-10-CM | POA: Diagnosis not present

## 2015-02-26 DIAGNOSIS — F209 Schizophrenia, unspecified: Secondary | ICD-10-CM | POA: Diagnosis not present

## 2015-02-26 DIAGNOSIS — J45909 Unspecified asthma, uncomplicated: Secondary | ICD-10-CM | POA: Diagnosis not present

## 2015-02-26 DIAGNOSIS — K219 Gastro-esophageal reflux disease without esophagitis: Secondary | ICD-10-CM | POA: Diagnosis not present

## 2015-02-26 DIAGNOSIS — M549 Dorsalgia, unspecified: Secondary | ICD-10-CM | POA: Diagnosis not present

## 2015-02-26 DIAGNOSIS — Z8701 Personal history of pneumonia (recurrent): Secondary | ICD-10-CM | POA: Insufficient documentation

## 2015-02-26 DIAGNOSIS — R52 Pain, unspecified: Secondary | ICD-10-CM | POA: Diagnosis not present

## 2015-02-26 DIAGNOSIS — R296 Repeated falls: Secondary | ICD-10-CM | POA: Diagnosis not present

## 2015-02-26 DIAGNOSIS — Z7901 Long term (current) use of anticoagulants: Secondary | ICD-10-CM | POA: Diagnosis not present

## 2015-02-26 DIAGNOSIS — Z872 Personal history of diseases of the skin and subcutaneous tissue: Secondary | ICD-10-CM | POA: Diagnosis not present

## 2015-02-26 DIAGNOSIS — Z86711 Personal history of pulmonary embolism: Secondary | ICD-10-CM | POA: Insufficient documentation

## 2015-02-26 DIAGNOSIS — S0990XA Unspecified injury of head, initial encounter: Secondary | ICD-10-CM | POA: Diagnosis not present

## 2015-02-26 DIAGNOSIS — Z9981 Dependence on supplemental oxygen: Secondary | ICD-10-CM | POA: Insufficient documentation

## 2015-02-26 DIAGNOSIS — Z86718 Personal history of other venous thrombosis and embolism: Secondary | ICD-10-CM | POA: Diagnosis not present

## 2015-02-26 DIAGNOSIS — Z79899 Other long term (current) drug therapy: Secondary | ICD-10-CM | POA: Insufficient documentation

## 2015-02-26 DIAGNOSIS — R51 Headache: Secondary | ICD-10-CM | POA: Diagnosis not present

## 2015-02-26 DIAGNOSIS — F419 Anxiety disorder, unspecified: Secondary | ICD-10-CM | POA: Diagnosis not present

## 2015-02-26 DIAGNOSIS — M1990 Unspecified osteoarthritis, unspecified site: Secondary | ICD-10-CM | POA: Diagnosis not present

## 2015-02-26 DIAGNOSIS — S199XXA Unspecified injury of neck, initial encounter: Secondary | ICD-10-CM | POA: Diagnosis not present

## 2015-02-26 DIAGNOSIS — R030 Elevated blood-pressure reading, without diagnosis of hypertension: Secondary | ICD-10-CM | POA: Diagnosis not present

## 2015-02-26 LAB — CBC WITH DIFFERENTIAL/PLATELET
Basophils Absolute: 0 10*3/uL (ref 0.0–0.1)
Basophils Relative: 0 % (ref 0–1)
Eosinophils Absolute: 0.3 10*3/uL (ref 0.0–0.7)
Eosinophils Relative: 4 % (ref 0–5)
HEMATOCRIT: 31.9 % — AB (ref 36.0–46.0)
Hemoglobin: 10.5 g/dL — ABNORMAL LOW (ref 12.0–15.0)
LYMPHS ABS: 0.8 10*3/uL (ref 0.7–4.0)
LYMPHS PCT: 14 % (ref 12–46)
MCH: 30.7 pg (ref 26.0–34.0)
MCHC: 32.9 g/dL (ref 30.0–36.0)
MCV: 93.3 fL (ref 78.0–100.0)
MONOS PCT: 5 % (ref 3–12)
Monocytes Absolute: 0.3 10*3/uL (ref 0.1–1.0)
Neutro Abs: 4.5 10*3/uL (ref 1.7–7.7)
Neutrophils Relative %: 77 % (ref 43–77)
PLATELETS: 255 10*3/uL (ref 150–400)
RBC: 3.42 MIL/uL — ABNORMAL LOW (ref 3.87–5.11)
RDW: 15.5 % (ref 11.5–15.5)
WBC: 5.9 10*3/uL (ref 4.0–10.5)

## 2015-02-26 LAB — COMPREHENSIVE METABOLIC PANEL
ALT: 13 U/L — ABNORMAL LOW (ref 14–54)
AST: 18 U/L (ref 15–41)
Albumin: 3.2 g/dL — ABNORMAL LOW (ref 3.5–5.0)
Alkaline Phosphatase: 101 U/L (ref 38–126)
Anion gap: 8 (ref 5–15)
BILIRUBIN TOTAL: 0.3 mg/dL (ref 0.3–1.2)
BUN: 11 mg/dL (ref 6–20)
CALCIUM: 8.9 mg/dL (ref 8.9–10.3)
CO2: 31 mmol/L (ref 22–32)
Chloride: 100 mmol/L — ABNORMAL LOW (ref 101–111)
Creatinine, Ser: 1.1 mg/dL — ABNORMAL HIGH (ref 0.44–1.00)
GFR calc Af Amer: 57 mL/min — ABNORMAL LOW (ref >60)
GFR, EST NON AFRICAN AMERICAN: 49 mL/min — AB (ref >60)
Glucose, Bld: 104 mg/dL — ABNORMAL HIGH (ref 65–99)
Potassium: 4.1 mmol/L (ref 3.5–5.1)
Sodium: 139 mmol/L (ref 135–145)
Total Protein: 7.1 g/dL (ref 6.5–8.1)

## 2015-02-26 LAB — PROTIME-INR
INR: 2.16 — AB (ref 0.00–1.49)
Prothrombin Time: 23.9 seconds — ABNORMAL HIGH (ref 11.6–15.2)

## 2015-02-26 MED ORDER — LUMBAR BACK BRACE/SUPPORT PAD MISC: 1.0000 | Freq: Every day | Status: DC | PRN

## 2015-02-26 NOTE — Discharge Instructions (Signed)
Chronic Pain Discharge Instructions  Emergency care providers appreciate that many patients coming to us are in severe pain and we wish to address their pain in the safest, most responsible manner.  It is important to recognize however, that the proper treatment of chronic pain differs from that of the pain of injuries and acute illnesses.  Our goal is to provide quality, safe, personalized care and we thank you for giving us the opportunity to serve you. The use of narcotics and related agents for chronic pain syndromes may lead to additional physical and psychological problems.  Nearly as many people die from prescription narcotics each year as die from car crashes.  Additionally, this risk is increased if such prescriptions are obtained from a variety of sources.  Therefore, only your primary care physician or a pain management specialist is able to safely treat such syndromes with narcotic medications long-term.    Documentation revealing such prescriptions have been sought from multiple sources may prohibit us from providing a refill or different narcotic medication.  Your name may be checked first through the [facility] Controlled Substances Reporting System.  This database is a record of controlled substance medication prescriptions that the patient has received.  This has been established by [facility]  in an effort to eliminate the dangerous, and often life threatening, practice of obtaining multiple prescriptions from different medical providers.   If you have a chronic pain syndrome (i.e. chronic headaches, recurrent back or neck pain, dental pain, abdominal or pelvis pain without a specific diagnosis, or neuropathic pain such as fibromyalgia) or recurrent visits for the same condition without an acute diagnosis, you may be treated with non-narcotics and other non-addictive medicines.  Allergic reactions or negative side effects that may be reported by a patient to such medications will not  typically lead to the use of a narcotic analgesic or other controlled substance as an alternative.   Patients managing chronic pain with a personal physician should have provisions in place for breakthrough pain.  If you are in crisis, you should call your physician.  If your physician directs you to the emergency department, please have the doctor call and speak to our attending physician concerning your care.   When patients come to the Emergency Department (ED) with acute medical conditions in which the Emergency Department physician feels appropriate to prescribe narcotic or sedating pain medication, the physician will prescribe these in very limited quantities.  The amount of these medications will last only until you can see your primary care physician in his/her office.  Any patient who returns to the ED seeking refills should expect only non-narcotic pain medications.   In the event of an acute medical condition exists and the emergency physician feels it is necessary that the patient be given a narcotic or sedating medication -  a responsible adult driver should be present in the room prior to the medication being given by the nurse.   Prescriptions for narcotic or sedating medications that have been lost, stolen or expired will not be refilled in the Emergency Department.    Patients who have chronic pain may receive non-narcotic prescriptions until seen by their primary care physician.  It is every patients personal responsibility to maintain active prescriptions with his or her primary care physician or specialist.   Fall Prevention and Home Safety Falls cause injuries and can affect all age groups. It is possible to use preventive measures to significantly decrease the likelihood of falls. There are many simple measures  which can make your home safer and prevent falls. OUTDOORS  Repair cracks and edges of walkways and driveways.  Remove high doorway thresholds.  Trim shrubbery on  the main path into your home.  Have good outside lighting.  Clear walkways of tools, rocks, debris, and clutter.  Check that handrails are not broken and are securely fastened. Both sides of steps should have handrails.  Have leaves, snow, and ice cleared regularly.  Use sand or salt on walkways during winter months.  In the garage, clean up grease or oil spills. BATHROOM  Install night lights.  Install grab bars by the toilet and in the tub and shower.  Use non-skid mats or decals in the tub or shower.  Place a plastic non-slip stool in the shower to sit on, if needed.  Keep floors dry and clean up all water on the floor immediately.  Remove soap buildup in the tub or shower on a regular basis.  Secure bath mats with non-slip, double-sided rug tape.  Remove throw rugs and tripping hazards from the floors. BEDROOMS  Install night lights.  Make sure a bedside light is easy to reach.  Do not use oversized bedding.  Keep a telephone by your bedside.  Have a firm chair with side arms to use for getting dressed.  Remove throw rugs and tripping hazards from the floor. KITCHEN  Keep handles on pots and pans turned toward the center of the stove. Use back burners when possible.  Clean up spills quickly and allow time for drying.  Avoid walking on wet floors.  Avoid hot utensils and knives.  Position shelves so they are not too high or low.  Place commonly used objects within easy reach.  If necessary, use a sturdy step stool with a grab bar when reaching.  Keep electrical cables out of the way.  Do not use floor polish or wax that makes floors slippery. If you must use wax, use non-skid floor wax.  Remove throw rugs and tripping hazards from the floor. STAIRWAYS  Never leave objects on stairs.  Place handrails on both sides of stairways and use them. Fix any loose handrails. Make sure handrails on both sides of the stairways are as long as the  stairs.  Check carpeting to make sure it is firmly attached along stairs. Make repairs to worn or loose carpet promptly.  Avoid placing throw rugs at the top or bottom of stairways, or properly secure the rug with carpet tape to prevent slippage. Get rid of throw rugs, if possible.  Have an electrician put in a light switch at the top and bottom of the stairs. OTHER FALL PREVENTION TIPS  Wear low-heel or rubber-soled shoes that are supportive and fit well. Wear closed toe shoes.  When using a stepladder, make sure it is fully opened and both spreaders are firmly locked. Do not climb a closed stepladder.  Add color or contrast paint or tape to grab bars and handrails in your home. Place contrasting color strips on first and last steps.  Learn and use mobility aids as needed. Install an electrical emergency response system.  Turn on lights to avoid dark areas. Replace light bulbs that burn out immediately. Get light switches that glow.  Arrange furniture to create clear pathways. Keep furniture in the same place.  Firmly attach carpet with non-skid or double-sided tape.  Eliminate uneven floor surfaces.  Select a carpet pattern that does not visually hide the edge of steps.  Be aware of  all pets. OTHER HOME SAFETY TIPS  Set the water temperature for 120 F (48.8 C).  Keep emergency numbers on or near the telephone.  Keep smoke detectors on every level of the home and near sleeping areas. Document Released: 08/31/2002 Document Revised: 03/11/2012 Document Reviewed: 11/30/2011 [facility] Patient Information 2015 [place], [place]. This information is not intended to replace advice given to you by your health care provider. Make sure you discuss any questions you have with your health care provider.

## 2015-02-26 NOTE — ED Notes (Signed)
Went to round on pt.  Pt is still in CT at this time.

## 2015-02-26 NOTE — ED Notes (Signed)
Pt states that she was getting out of a car yesterday, when her shoe got caught on the running board and she fell backwards. Pt states that she hit her head. Pt fell on concrete.

## 2015-02-26 NOTE — ED Provider Notes (Signed)
CSN: [id number]     Arrival date & time 02/26/15  0531 History   First MD Initiated Contact with Patient 02/26/15 [id number]     Chief Complaint  Patient presents with  . Fall  . Back Pain     (Consider location/radiation/quality/duration/timing/severity/associated sxs/prior Treatment) HPI    Patient w hx chronic pain syndrome, myalgias, chronic back pain, htn,drug seeking behavior. She comes in today for fall. The patient is a very poor historian. It is unclear to me whether this is due to her tangential speech. Because of her schizophrenia, or poor. Vocabulary and lack of insight. Patient indicates that she has been falling frequently. She states that yesterday her foot got caught and he is unable to keep her balance. She states she did fall and hit her head. She is on chronic anticoagulations and states "I haven't had a fingerstick in over a year" which I believe means. She has not had her INR checked in the year. The patient is on chronic anticoagulation for factor V Leiden. She states that she has had difficulty with balance for several months. She also has a history of osteoarthritis and states that her left knee frequently gives out. She is status post total knee arthroscopy on the right. Pt indicates has hx dizziness/vertigo, but denies recent exacerbation. Currently no room spinning, no faintness or lightheadedness. Is ambulating per normal/baseline. No new numbness/weakness. No fever or chills. Normal appetite. No nvd. States compliant w other meds.    Past Medical History  Diagnosis Date  . Depression   . Schizophrenia   . Hyperlipidemia   . Hypertension   . Chronic pain     "over my whole body" (03/30/2013)  . 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 DVT (deep venous thrombosis)     per 01/17/10 d/c summary- "remote hx of dvt"?  . Iron deficiency anemia   . Glaucoma   . Hemorrhoids   . Asthma   . Anxiety   . GERD (gastroesophageal reflux disease)   .  Psoriasis 04/29/2012    New onset evaluated by Dr Marylou Flesher, Dermatology, [facility] 6/13  Rx 0.1% Tacrolimus ointment  . Complication of anesthesia     "because I have sleep apnea" (03/30/2013)  . Heart murmur   . Chest pain, exertional   . Chronic bronchitis   . Sleep apnea     "waiting on my CPAP" (03/30/2013)  . Pneumonia     "a few times" (03/30/2013)  . Exertional shortness of breath     "& sometimes when laying down" (03/30/2013)  . Daily headache     "last 2 months" (03/30/2013)  . Arthritis     "joints" (03/30/2013)  . Varicose veins of legs   . Psoriasis    Past Surgical History  Procedure Laterality Date  . Spinal fusion      2004  . Carpal tunnel release Bilateral   . Tubal ligation  1973  . Dilation and curettage of uterus  1970's    "once" (03/30/2013)  . 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 Joint replacement    . Vein surgery  left leg   Family History  Problem Relation Age of Onset  . Diabetes Sister   . Colon cancer Brother 40  . Colon cancer Brother    History  Substance Use Topics  . Smoking status: Never Smoker   . Smokeless tobacco: Never Used  . Alcohol Use: No   OB History  No data available     Review of Systems  Ten systems reviewed and are negative for acute change, except as noted in the HPI.    Allergies  Review of patient's allergies indicates no known allergies.  Home Medications   Prior to Admission medications   Medication Sig Start Date End Date Taking? Authorizing Provider  acetaminophen (TYLENOL) 500 MG tablet Take 2 tablets (1,000 mg total) by mouth every 8 (eight) hours as needed. Patient taking differently: Take 1,000 mg by mouth every 8 (eight) hours as needed for mild pain or moderate pain.  11/22/14   Courtney Forcucci, PA-C  albuterol (PROVENTIL HFA;VENTOLIN HFA) 108 (90 BASE) MCG/ACT inhaler Inhale 2 puffs into the lungs every 6 (six) hours as needed for wheezing or shortness of breath (wheezing). 12/08/14   Tyrone Nineyan B  Grunz, MD  atenolol (TENORMIN) 100 MG tablet TAKE 1 TABLET BY MOUTH EVERY DAY 02/20/15   Tyrone Nineyan B Grunz, MD  atorvastatin (LIPITOR) 40 MG tablet Take 40 mg by mouth daily.    Historical Provider, MD  beclomethasone (QVAR) 80 MCG/ACT inhaler Inhale 2 puffs into the lungs 2 (two) times daily as needed (wheezing).     Historical Provider, MD  cyclobenzaprine (FLEXERIL) 10 MG tablet Take 1 tablet (10 mg total) by mouth 3 (three) times daily as needed for muscle spasms. Patient not taking: Reported on 02/01/2015 12/08/14   Tyrone Nineyan B Grunz, MD  diazepam (VALIUM) 2 MG tablet Take 1 tablet (2 mg total) by mouth every 12 (twelve) hours as needed (vertigo). Patient not taking: Reported on 02/01/2015 12/28/14   Gilda Creasehristopher J Pollina, MD  esomeprazole (NEXIUM) 40 MG capsule Take 1 capsule (40 mg total) by mouth daily. 02/01/15   Arby BarretteMarcy Pfeiffer, MD  FLUoxetine (PROZAC) 20 MG capsule Take 3 capsules (60 mg total) by mouth daily. 05/26/14   Tyrone Nineyan B Grunz, MD  furosemide (LASIX) 20 MG tablet Take 1 tablet (20 mg total) by mouth daily. Patient taking differently: Take 20 mg by mouth as needed for fluid.  10/29/14   Tyrone Nineyan B Grunz, MD  furosemide (LASIX) 20 MG tablet Take 20 mg by mouth daily as needed for fluid (in legs).    Historical Provider, MD  gabapentin (NEURONTIN) 400 MG capsule Take 1 capsule (400 mg total) by mouth 3 (three) times daily. 12/08/14   Tyrone Nineyan B Grunz, MD  hydrochlorothiazide (HYDRODIURIL) 25 MG tablet Take 25 mg by mouth daily.    Historical Provider, MD  hydroxypropyl methylcellulose / hypromellose (ISOPTO TEARS / GONIOVISC) 2.5 % ophthalmic solution Place 1 drop into both eyes 3 (three) times daily as needed for dry eyes.    Historical Provider, MD  hydrOXYzine (ATARAX/VISTARIL) 10 MG tablet Take 1 tablet (10 mg total) by mouth at bedtime as needed for itching (itching). 10/29/14   Tyrone Nineyan B Grunz, MD  lisinopril (PRINIVIL,ZESTRIL) 20 MG tablet Take 1 tablet (20 mg total) by mouth daily. 12/08/14   Tyrone Nineyan B Grunz, MD   meclizine (ANTIVERT) 25 MG tablet Take 1 tablet (25 mg total) by mouth 3 (three) times daily as needed for dizziness. 02/01/15   Arby BarretteMarcy Pfeiffer, MD  methocarbamol (ROBAXIN) 500 MG tablet Take 1 tablet (500 mg total) by mouth 3 (three) times daily between meals as needed. Patient not taking: Reported on 02/01/2015 12/12/14   Rolland PorterMark James, MD  omeprazole (PRILOSEC) 40 MG capsule Take 1 capsule (40 mg total) by mouth daily. Patient not taking: Reported on 02/11/2015 08/31/14   Arby BarretteMarcy Pfeiffer, MD  silver sulfADIAZINE (SILVADENE)  1 % cream APPLY TOPICALLY EVERY DAY Patient taking differently: APPLY TOPICALLY EVERY DAY BID on legs for dryness 12/10/14   Tyrone Nine, MD  tacrolimus (PROTOPIC) 0.1 % ointment Apply 1 application topically 3 (three) times daily as needed (for legs). Apply to affected areas daily PRN 03/01/14 03/01/15  Historical Provider, MD  traMADol (ULTRAM) 50 MG tablet Take 1 tablet (50 mg total) by mouth every 6 (six) hours as needed. 01/26/15   Harle Battiest, NP  triamcinolone cream (KENALOG) 0.1 % Apply 1 application topically 2 (two) times daily as needed (for legs). Patient not taking: Reported on 02/11/2015 10/29/14   Tyrone Nine, MD  warfarin (COUMADIN) 5 MG tablet TAKE 1 TABLET BY MOUTH EVERY DAY 10/18/14   Levert Feinstein, MD   BP 152/85 mmHg  Pulse 71  Temp(Src) 98.3 F (36.8 C) (Oral)  Resp 16  Ht  (1.651 m)  Wt 240 lb (108.863 kg)  BMI 39.94 kg/m2  SpO2 98%  LMP 11/22/1996 Physical Exam  Constitutional: She is oriented to person, place, and time. She appears well-developed and well-nourished. No distress.  HENT:  Head: Normocephalic and atraumatic.  Eyes: Conjunctivae are normal. No scleral icterus.  Neck: Normal range of motion.  Cardiovascular: Normal rate, regular rhythm and normal heart sounds.  Exam reveals no gallop and no friction rub.   No murmur heard. Pulmonary/Chest: Effort normal and breath sounds normal. No respiratory distress.  Abdominal: Soft.  Bowel sounds are normal. She exhibits no distension and no mass. There is no tenderness. There is no guarding.  Neurological: She is alert and oriented to person, place, and time.  Speech is clear and goal oriented, follows commands Major Cranial nerves without deficit, no facial droop Normal strength in upper and lower extremities bilaterally including dorsiflexion and plantar flexion, strong and equal grip strength Sensation normal to light and sharp touch Moves extremities without ataxia, coordination intact Normal finger to nose and rapid alternating movements Neg romberg, no pronator drift Antalgic gait   Skin: Skin is warm and dry. She is not diaphoretic.    ED Course  Procedures (including critical care time) Labs Review Labs Reviewed  COMPREHENSIVE METABOLIC PANEL - Abnormal; Notable for the following:    Chloride 100 (*)    Glucose, Bld 104 (*)    Creatinine, Ser 1.10 (*)    Albumin 3.2 (*)    ALT 13 (*)    GFR calc non Af Amer 49 (*)    GFR calc Af Amer 57 (*)    All other components within normal limits  CBC WITH DIFFERENTIAL/PLATELET - Abnormal; Notable for the following:    RBC 3.42 (*)    Hemoglobin 10.5 (*)    HCT 31.9 (*)    All other components within normal limits  URINALYSIS, ROUTINE W REFLEX MICROSCOPIC (NOT AT [facility])  URINE RAPID DRUG SCREEN (HOSP PERFORMED) NOT AT [facility]  PROTIME-INR    Imaging Review No results found.   EKG Interpretation None      MDM   Final diagnoses:  Chronic pain  Frequent falls    8:07 AM BP 152/85 mmHg  Pulse 71  Temp(Src) 98.3 F (36.8 C) (Oral)  Resp 16  Ht  (1.651 m)  Wt 240 lb (108.863 kg)  BMI 39.94 kg/m2  SpO2 98%  LMP 11/22/1996 Patient with c/o weakness and gait imbalance. She has equal strength bilaterally. Patient states he is taking Neurontin. It is unclear if she has a history of peripheral  neuropathy.    Since the CT scan negative for acute abnormality. Her labs are reassuring and  appeared to be at baseline for the patient. She is therapeutic on her INR. The patient appears safe for discharge. She states that she wants pain medicine and she has not able to find a "good doctor in [place]. She is demanding that I refill her pain medications.   spoke with the patient and discussed the management. [facility] management of her medical conditions. She is seen in shared visit with Dr. Rosalia Hammers. Ambulating in the emergency department easily. Patient has a walker at home She appears safe for discharge at this time  Arthor Captain, PA-C 02/26/15 1555  Margarita Grizzle, MD 02/26/15 [phone]

## 2015-03-15 ENCOUNTER — Ambulatory Visit: Payer: Self-pay | Admitting: Podiatry

## 2015-03-22 ENCOUNTER — Emergency Department (HOSPITAL_COMMUNITY)
Admission: EM | Admit: 2015-03-22 | Discharge: 2015-03-22 | Disposition: A | Discharge status: Home / Self Care | Payer: Medicare Other | Attending: Emergency Medicine | Admitting: Emergency Medicine

## 2015-03-22 ENCOUNTER — Encounter (HOSPITAL_COMMUNITY): Payer: Self-pay | Admitting: Emergency Medicine

## 2015-03-22 DIAGNOSIS — F419 Anxiety disorder, unspecified: Secondary | ICD-10-CM | POA: Insufficient documentation

## 2015-03-22 DIAGNOSIS — Z86718 Personal history of other venous thrombosis and embolism: Secondary | ICD-10-CM | POA: Insufficient documentation

## 2015-03-22 DIAGNOSIS — R531 Weakness: Secondary | ICD-10-CM | POA: Diagnosis not present

## 2015-03-22 DIAGNOSIS — Z872 Personal history of diseases of the skin and subcutaneous tissue: Secondary | ICD-10-CM | POA: Diagnosis not present

## 2015-03-22 DIAGNOSIS — E669 Obesity, unspecified: Secondary | ICD-10-CM | POA: Diagnosis not present

## 2015-03-22 DIAGNOSIS — Z79899 Other long term (current) drug therapy: Secondary | ICD-10-CM | POA: Diagnosis not present

## 2015-03-22 DIAGNOSIS — K219 Gastro-esophageal reflux disease without esophagitis: Secondary | ICD-10-CM | POA: Insufficient documentation

## 2015-03-22 DIAGNOSIS — Z7901 Long term (current) use of anticoagulants: Secondary | ICD-10-CM | POA: Diagnosis not present

## 2015-03-22 DIAGNOSIS — Z7951 Long term (current) use of inhaled steroids: Secondary | ICD-10-CM | POA: Insufficient documentation

## 2015-03-22 DIAGNOSIS — M1990 Unspecified osteoarthritis, unspecified site: Secondary | ICD-10-CM | POA: Diagnosis not present

## 2015-03-22 DIAGNOSIS — E785 Hyperlipidemia, unspecified: Secondary | ICD-10-CM | POA: Insufficient documentation

## 2015-03-22 DIAGNOSIS — J45909 Unspecified asthma, uncomplicated: Secondary | ICD-10-CM | POA: Insufficient documentation

## 2015-03-22 DIAGNOSIS — T45511A Poisoning by anticoagulants, accidental (unintentional), initial encounter: Secondary | ICD-10-CM | POA: Diagnosis not present

## 2015-03-22 DIAGNOSIS — Z8701 Personal history of pneumonia (recurrent): Secondary | ICD-10-CM | POA: Diagnosis not present

## 2015-03-22 DIAGNOSIS — F329 Major depressive disorder, single episode, unspecified: Secondary | ICD-10-CM

## 2015-03-22 DIAGNOSIS — R5383 Other fatigue: Secondary | ICD-10-CM | POA: Diagnosis present

## 2015-03-22 DIAGNOSIS — Z86711 Personal history of pulmonary embolism: Secondary | ICD-10-CM | POA: Diagnosis not present

## 2015-03-22 DIAGNOSIS — I10 Essential (primary) hypertension: Secondary | ICD-10-CM | POA: Insufficient documentation

## 2015-03-22 DIAGNOSIS — Z862 Personal history of diseases of the blood and blood-forming organs and certain disorders involving the immune mechanism: Secondary | ICD-10-CM | POA: Diagnosis not present

## 2015-03-22 DIAGNOSIS — G8929 Other chronic pain: Secondary | ICD-10-CM | POA: Diagnosis not present

## 2015-03-22 DIAGNOSIS — F209 Schizophrenia, unspecified: Secondary | ICD-10-CM | POA: Insufficient documentation

## 2015-03-22 DIAGNOSIS — R011 Cardiac murmur, unspecified: Secondary | ICD-10-CM | POA: Diagnosis not present

## 2015-03-22 DIAGNOSIS — R404 Transient alteration of awareness: Secondary | ICD-10-CM | POA: Diagnosis not present

## 2015-03-22 DIAGNOSIS — E876 Hypokalemia: Secondary | ICD-10-CM | POA: Diagnosis not present

## 2015-03-22 DIAGNOSIS — F32A Depression, unspecified: Secondary | ICD-10-CM

## 2015-03-22 LAB — CBC
HEMATOCRIT: 34 % — AB (ref 36.0–46.0)
HEMOGLOBIN: 11.4 g/dL — AB (ref 12.0–15.0)
MCH: 30.5 pg (ref 26.0–34.0)
MCHC: 33.5 g/dL (ref 30.0–36.0)
MCV: 90.9 fL (ref 78.0–100.0)
Platelets: 200 10*3/uL (ref 150–400)
RBC: 3.74 MIL/uL — AB (ref 3.87–5.11)
RDW: 14.7 % (ref 11.5–15.5)
WBC: 5.9 10*3/uL (ref 4.0–10.5)

## 2015-03-22 LAB — COMPREHENSIVE METABOLIC PANEL
ALK PHOS: 91 U/L (ref 38–126)
ALT: 10 U/L — AB (ref 14–54)
ANION GAP: 7 (ref 5–15)
AST: 17 U/L (ref 15–41)
Albumin: 3.6 g/dL (ref 3.5–5.0)
BUN: 9 mg/dL (ref 6–20)
CHLORIDE: 103 mmol/L (ref 101–111)
CO2: 27 mmol/L (ref 22–32)
Calcium: 9 mg/dL (ref 8.9–10.3)
Creatinine, Ser: 0.97 mg/dL (ref 0.44–1.00)
GFR, EST NON AFRICAN AMERICAN: 57 mL/min — AB (ref >60)
GLUCOSE: 94 mg/dL (ref 65–99)
POTASSIUM: 3.3 mmol/L — AB (ref 3.5–5.1)
Sodium: 137 mmol/L (ref 135–145)
Total Bilirubin: 0.5 mg/dL (ref 0.3–1.2)
Total Protein: 7.7 g/dL (ref 6.5–8.1)

## 2015-03-22 LAB — PROTIME-INR
INR: 3.59 — AB (ref 0.00–1.49)
PROTHROMBIN TIME: 35 s — AB (ref 11.6–15.2)

## 2015-03-22 MED ORDER — POTASSIUM CHLORIDE CRYS ER 20 MEQ PO TBCR
40.0000 meq | EXTENDED_RELEASE_TABLET | Freq: Once | ORAL | Status: AC
  Administered 2015-03-22: 40 meq via ORAL
  Filled 2015-03-22: qty 2

## 2015-03-22 MED ORDER — POTASSIUM CHLORIDE CRYS ER 20 MEQ PO TBCR
40.0000 meq | EXTENDED_RELEASE_TABLET | Freq: Once | ORAL | Status: AC
  Filled 2015-03-22: qty 2

## 2015-03-22 NOTE — ED Notes (Signed)
Per EMS: pt from home for eval of weakness and fatigue x a couple of months, Pt also reports increased stressed due to family and medical issues. Pt states she has not been taking her medications due for 3 months due to not going to get refills or see her doctor. EMS noted bp to be 215/105, pt only complains of chronic generalized pain. nad noted at this time.

## 2015-03-22 NOTE — ED Notes (Signed)
NAD at this time. Pt is stable and going home.  

## 2015-03-22 NOTE — ED Notes (Signed)
I gave the patient 2 packs of crackers, 2 packs of graham crackers, a cup of ice, and a ginger-ale per Dr. Shela CommonsJ.

## 2015-03-22 NOTE — Discharge Instructions (Signed)
Depression Don't take Coumadin tomorrow. You can resume your normal Coumadin dose in 2 days. Keep your appointment at the Coumadin clinic for this week. Tell them that your INR (Coumadin level) today was 3.52, and that you were advised to skip your dose on June 29. Your Coumadin dosage may need to be adjusted. Call the [facility] tomorrow to arrange to be seen to go over your complete medication list. Call your psychiatrist to schedule the next available appointment. Return if you feel worse for any reason. Depression refers to feeling sad, low, down in the dumps, blue, gloomy, or empty. In general, there are two kinds of depression: 1. Normal sadness or normal grief. This kind of depression is one that we all feel from time to time after upsetting life experiences, such as the loss of a job or the ending of a relationship. This kind of depression is considered normal, is short lived, and resolves within a few days to 2 weeks. Depression experienced after the loss of a loved one (bereavement) often lasts longer than 2 weeks but normally gets better with time. 2. Clinical depression. This kind of depression lasts longer than normal sadness or normal grief or interferes with your ability to function at home, at work, and in school. It also interferes with your personal relationships. It affects almost every aspect of your life. Clinical depression is an illness. Symptoms of depression can also be caused by conditions other than those mentioned above, such as:  Physical illness. Some physical illnesses, including underactive thyroid gland (hypothyroidism), severe anemia, specific types of cancer, diabetes, uncontrolled seizures, heart and lung problems, strokes, and chronic pain are commonly associated with symptoms of depression.  Side effects of some prescription medicine. In some people, certain types of medicine can cause symptoms of depression.  Substance abuse. Abuse of alcohol and illicit drugs can  cause symptoms of depression. SYMPTOMS Symptoms of normal sadness and normal grief include the following:  Feeling sad or crying for short periods of time.  Not caring about anything (apathy).  Difficulty sleeping or sleeping too much.  No longer able to enjoy the things you used to enjoy.  Desire to be by oneself all the time (social isolation).  Lack of energy or motivation.  Difficulty concentrating or remembering.  Change in appetite or weight.  Restlessness or agitation. Symptoms of clinical depression include the same symptoms of normal sadness or normal grief and also the following symptoms:  Feeling sad or crying all the time.  Feelings of guilt or worthlessness.  Feelings of hopelessness or helplessness.  Thoughts of suicide or the desire to harm yourself (suicidal ideation).  Loss of touch with reality (psychotic symptoms). Seeing or hearing things that are not real (hallucinations) or having false beliefs about your life or the people around you (delusions and paranoia). DIAGNOSIS  The diagnosis of clinical depression is usually based on how bad the symptoms are and how long they have lasted. Your health care provider will also ask you questions about your medical history and substance use to find out if physical illness, use of prescription medicine, or substance abuse is causing your depression. Your health care provider may also order blood tests. TREATMENT  Often, normal sadness and normal grief do not require treatment. However, sometimes antidepressant medicine is given for bereavement to ease the depressive symptoms until they resolve. The treatment for clinical depression depends on how bad the symptoms are but often includes antidepressant medicine, counseling with a mental health professional, or both.  Your health care provider will help to determine what treatment is best for you. Depression caused by physical illness usually goes away with appropriate  medical treatment of the illness. If prescription medicine is causing depression, talk with your health care provider about stopping the medicine, decreasing the dose, or changing to another medicine. Depression caused by the abuse of alcohol or illicit drugs goes away when you stop using these substances. Some adults need professional help in order to stop drinking or using drugs. SEEK IMMEDIATE MEDICAL CARE IF:  You have thoughts about hurting yourself or others.  You lose touch with reality (have psychotic symptoms).  You are taking medicine for depression and have a serious side effect. FOR MORE INFORMATION  National Alliance on Mental Illness: www.nami.[employer]  National Institute of Mental Health: http://www.maynard.net/www.nimh.nih.gov Document Released: 09/07/2000 Document Revised: 01/25/2014 Document Reviewed: 12/10/2011 [facility] Patient Information 2015 [place], [place]. This information is not intended to replace advice given to you by your health care provider. Make sure you discuss any questions you have with your health care provider.

## 2015-03-22 NOTE — ED Provider Notes (Signed)
CSN: [id number]     Arrival date & time 03/22/15  1324 History   First MD Initiated Contact with Patient 03/22/15 1350     Chief Complaint  Patient presents with  . Hypertension  . Fatigue     (Consider location/radiation/quality/duration/timing/severity/associated sxs/prior Treatment) HPI Patient reports that she's felt generalized weakness for 43 years. She also is depressed over her husband's having a stroke several months ago, and her sons chronic medical conditions including seizures. She reports that she's been taking some of her medications and others she hasn't been but cannot tell me which medicines he's been taking and which she hasn't. She denies suicidal or homicidal ideation or wish to harm herself or others. She has been compliant with Coumadin. She denies shortness of breath chest pain headache or abdominal pain has had diminished appetite for several months. No other associated symptoms. She has not made an appointment with her psychiatrist for several years however she does have Prozac and has been compliant with it, prescribed by her psychiatrist. Nothing makes symptoms better or worse. All Symptoms are constant for months to years Past Medical History  Diagnosis Date  . Depression   . Schizophrenia   . Hyperlipidemia   . Hypertension   . Chronic pain     "over my whole body" (03/30/2013)  . 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 DVT (deep venous thrombosis)     per 01/17/10 d/c summary- "remote hx of dvt"?  . Iron deficiency anemia   . Glaucoma   . Hemorrhoids   . Asthma   . Anxiety   . GERD (gastroesophageal reflux disease)   . Psoriasis 04/29/2012    New onset evaluated by Dr Marylou Flesher, Dermatology, [facility] 6/13  Rx 0.1% Tacrolimus ointment  . Complication of anesthesia     "because I have sleep apnea" (03/30/2013)  . Heart murmur   . Chest pain, exertional   . Chronic bronchitis   . Sleep apnea     "waiting on my CPAP" (03/30/2013)   . Pneumonia     "a few times" (03/30/2013)  . Exertional shortness of breath     "& sometimes when laying down" (03/30/2013)  . Daily headache     "last 2 months" (03/30/2013)  . Arthritis     "joints" (03/30/2013)  . Varicose veins of legs   . Psoriasis    Past Surgical History  Procedure Laterality Date  . Spinal fusion      2004  . Carpal tunnel release Bilateral   . Tubal ligation  1973  . Dilation and curettage of uterus  1970's    "once" (03/30/2013)  . 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 Joint replacement    . Vein surgery  left leg   Family History  Problem Relation Age of Onset  . Diabetes Sister   . Colon cancer Brother 40  . Colon cancer Brother    History  Substance Use Topics  . Smoking status: Never Smoker   . Smokeless tobacco: Never Used  . Alcohol Use: No   OB History    No data available     Review of Systems  Constitutional: Positive for fatigue.  HENT: Negative.   Respiratory: Negative.   Cardiovascular: Negative.   Gastrointestinal: Negative.   Musculoskeletal: Negative.   Skin: Negative.   Neurological: Negative.   Psychiatric/Behavioral: Positive for dysphoric mood.  All other systems reviewed and are negative.     Allergies  Review of patient's allergies indicates no known allergies.  Home Medications   Prior to Admission medications   Medication Sig Start Date End Date Taking? Authorizing Provider  albuterol (PROVENTIL HFA;VENTOLIN HFA) 108 (90 BASE) MCG/ACT inhaler Inhale 2 puffs into the lungs every 6 (six) hours as needed for wheezing or shortness of breath (wheezing). 12/08/14  Yes Tyrone Nine, MD  acetaminophen (TYLENOL) 500 MG tablet Take 2 tablets (1,000 mg total) by mouth every 8 (eight) hours as needed. Patient not taking: Reported on 02/26/2015 11/22/14   Terri Piedra, PA-C  acetaminophen-codeine (TYLENOL #3) 300-30 MG per tablet Take 1 tablet by mouth daily as needed. 02/17/15   Historical Provider, MD  amitriptyline  (ELAVIL) 25 MG tablet  02/17/15   Historical Provider, MD  atenolol (TENORMIN) 100 MG tablet TAKE 1 TABLET BY MOUTH EVERY DAY 02/20/15   Tyrone Nine, MD  atorvastatin (LIPITOR) 40 MG tablet Take 40 mg by mouth daily.    Historical Provider, MD  beclomethasone (QVAR) 80 MCG/ACT inhaler Inhale 2 puffs into the lungs 2 (two) times daily as needed (wheezing).     Historical Provider, MD  cyclobenzaprine (FLEXERIL) 10 MG tablet Take 1 tablet (10 mg total) by mouth 3 (three) times daily as needed for muscle spasms. Patient not taking: Reported on 02/01/2015 12/08/14   Tyrone Nine, MD  diazepam (VALIUM) 2 MG tablet Take 1 tablet (2 mg total) by mouth every 12 (twelve) hours as needed (vertigo). Patient not taking: Reported on 02/01/2015 12/28/14   Gilda Crease, MD  diazepam (VALIUM) 5 MG tablet  12/28/14   Historical Provider, MD  Elastic Bandages & Supports (LUMBAR BACK BRACE/SUPPORT PAD) MISC 1 Device by Does not apply route daily as needed. 02/26/15   Arthor Captain, PA-C  esomeprazole (NEXIUM) 40 MG capsule Take 1 capsule (40 mg total) by mouth daily. 02/01/15   Arby Barrette, MD  FLUoxetine (PROZAC) 20 MG capsule Take 3 capsules (60 mg total) by mouth daily. 05/26/14   Tyrone Nine, MD  furosemide (LASIX) 20 MG tablet Take 1 tablet (20 mg total) by mouth daily. Patient taking differently: Take 20 mg by mouth as needed for fluid.  10/29/14   Tyrone Nine, MD  furosemide (LASIX) 20 MG tablet Take 20 mg by mouth daily as needed for fluid (in legs).    Historical Provider, MD  gabapentin (NEURONTIN) 300 MG capsule Take 300 mg by mouth 3 (three) times daily.    Historical Provider, MD  gabapentin (NEURONTIN) 400 MG capsule Take 1 capsule (400 mg total) by mouth 3 (three) times daily. 12/08/14   Tyrone Nine, MD  hydrochlorothiazide (HYDRODIURIL) 25 MG tablet Take 25 mg by mouth daily.    Historical Provider, MD  hydroxypropyl methylcellulose / hypromellose (ISOPTO TEARS / GONIOVISC) 2.5 % ophthalmic solution  Place 1 drop into both eyes 3 (three) times daily as needed for dry eyes.    Historical Provider, MD  hydrOXYzine (ATARAX/VISTARIL) 10 MG tablet Take 1 tablet (10 mg total) by mouth at bedtime as needed for itching (itching). 10/29/14   Tyrone Nine, MD  lisinopril (PRINIVIL,ZESTRIL) 10 MG tablet  12/20/14   Historical Provider, MD  lisinopril (PRINIVIL,ZESTRIL) 20 MG tablet Take 1 tablet (20 mg total) by mouth daily. 12/08/14   Tyrone Nine, MD  meclizine (ANTIVERT) 25 MG tablet Take 1 tablet (25 mg total) by mouth 3 (three) times daily as needed for dizziness. 02/01/15   Arby Barrette, MD  methocarbamol (ROBAXIN)  500 MG tablet Take 1 tablet (500 mg total) by mouth 3 (three) times daily between meals as needed. Patient not taking: Reported on 02/01/2015 12/12/14   Rolland PorterMark James, MD  omeprazole (PRILOSEC) 40 MG capsule Take 1 capsule (40 mg total) by mouth daily. Patient not taking: Reported on 02/11/2015 08/31/14   Arby BarretteMarcy Pfeiffer, MD  silver sulfADIAZINE (SILVADENE) 1 % cream APPLY TOPICALLY EVERY DAY Patient taking differently: APPLY TOPICALLY EVERY DAY BID on legs for dryness 12/10/14   Tyrone Nineyan B Grunz, MD  traMADol (ULTRAM) 50 MG tablet Take 1 tablet (50 mg total) by mouth every 6 (six) hours as needed. 01/26/15   Harle BattiestElizabeth Tysinger, NP  triamcinolone cream (KENALOG) 0.1 % Apply 1 application topically 2 (two) times daily as needed (for legs). 10/29/14   Tyrone Nineyan B Grunz, MD  warfarin (COUMADIN) 5 MG tablet TAKE 1 TABLET BY MOUTH EVERY DAY 10/18/14   Levert FeinsteinJames M Granfortuna, MD   BP 166/83 mmHg  Pulse 55  Temp(Src) 99.4 F (37.4 C) (Oral)  Resp 16  Wt 240 lb (108.863 kg)  SpO2 100%  LMP 11/22/1996 Physical Exam  Constitutional: She is oriented to person, place, and time. She appears well-developed and well-nourished.  HENT:  Head: Normocephalic and atraumatic.  Eyes: Conjunctivae are normal. Pupils are equal, round, and reactive to light.  Neck: Neck supple. No tracheal deviation present. No thyromegaly present.   Cardiovascular: Normal rate and regular rhythm.   No murmur heard. Pulmonary/Chest: Effort normal and breath sounds normal.  Abdominal: Soft. Bowel sounds are normal. She exhibits no distension. There is no tenderness.  Obese  Musculoskeletal: Normal range of motion. She exhibits no edema or tenderness.  Neurological: She is alert and oriented to person, place, and time. No cranial nerve deficit. Coordination normal.  Gait normal not lightheaded on standing  Skin: Skin is warm and dry. No rash noted.  Psychiatric: She has a normal mood and affect.  Nursing note and vitals reviewed.   ED Course  Procedures (including critical care time) Labs Review Labs Reviewed - No data to display  Imaging Review No results found.   EKG Interpretation None     5:10 PM patient feels well and ready to go home. Results for orders placed or performed during the hospital encounter of 03/22/15  CBC  Result Value Ref Range   WBC 5.9 4.0 - 10.5 K/uL   RBC 3.74 (L) 3.87 - 5.11 MIL/uL   Hemoglobin 11.4 (L) 12.0 - 15.0 g/dL   HCT 16.134.0 (L) 09.636.0 - 04.546.0 %   MCV 90.9 78.0 - 100.0 fL   MCH 30.5 26.0 - 34.0 pg   MCHC 33.5 30.0 - 36.0 g/dL   RDW 40.914.7 81.111.5 - 91.415.5 %   Platelets 200 150 - 400 K/uL  Comprehensive metabolic panel  Result Value Ref Range   Sodium 137 135 - 145 mmol/L   Potassium 3.3 (L) 3.5 - 5.1 mmol/L   Chloride 103 101 - 111 mmol/L   CO2 27 22 - 32 mmol/L   Glucose, Bld 94 65 - 99 mg/dL   BUN 9 6 - 20 mg/dL   Creatinine, Ser 7.820.97 0.44 - 1.00 mg/dL   Calcium 9.0 8.9 - 95.610.3 mg/dL   Total Protein 7.7 6.5 - 8.1 g/dL   Albumin 3.6 3.5 - 5.0 g/dL   AST 17 15 - 41 U/L   ALT 10 (L) 14 - 54 U/L   Alkaline Phosphatase 91 38 - 126 U/L   Total Bilirubin 0.5 0.3 -  1.2 mg/dL   GFR calc non Af Amer 57 (L) >60 mL/min   GFR calc Af Amer >60 >60 mL/min   Anion gap 7 5 - 15  Protime-INR  Result Value Ref Range   Prothrombin Time 35.0 (H) 11.6 - 15.2 seconds   INR 3.59 (H) 0.00 - 1.49    *Note: Due to a large number of results and/or encounters for the requested time period, some results have not been displayed. A complete set of results can be found in Results Review.   Ct Head Wo Contrast  02/26/2015   CLINICAL DATA:  Pt states that she was getting out of a car yesterday, when her shoe got caught on the running board and she fell backwards. Pt states that she hit her head. Pt fell on concretePt states she has been having frequent falls and Headaches with sharp pains in head and neck No hx of stroke No hx of ca No hx of seizures  EXAM: CT HEAD WITHOUT CONTRAST  CT CERVICAL SPINE WITHOUT CONTRAST  TECHNIQUE: Multidetector CT imaging of the head and cervical spine was performed following the standard protocol without intravenous contrast. Multiplanar CT image reconstructions of the cervical spine were also generated.  COMPARISON:  12/12/2014.  FINDINGS: CT HEAD FINDINGS  Ventricles are normal in size, for this patient's age, and normal in configuration. There are no parenchymal masses or mass effect. There is no evidence of a cortical infarct. There are no extra-axial masses or abnormal fluid collections.  There is no intracranial hemorrhage.  Visualized sinuses and mastoid air cells are clear. No skull lesion or fracture.  CT CERVICAL SPINE FINDINGS  No fracture. No spondylolisthesis. There are disc degenerative changes throughout the cervical spine with moderate loss disc height and endplate spurring and irregularity. Facet joints are relatively well preserved. There is a hypo attenuating lesion in the right lobe of the thyroid measuring 1.8 cm. This is stable from a CT cervical spine dated 02/22/2011. Soft tissues are otherwise unremarkable. Lung apices show stable areas of scarring.  IMPRESSION: 1. HEAD CT:  No acute intracranial abnormality.  No skull fracture. 2. CERVICAL CT:  No fracture or acute finding.   Electronically Signed   By: Amie Portland M.D.   On: 02/26/2015 08:27   Ct  Cervical Spine Wo Contrast  02/26/2015   CLINICAL DATA:  Pt states that she was getting out of a car yesterday, when her shoe got caught on the running board and she fell backwards. Pt states that she hit her head. Pt fell on concretePt states she has been having frequent falls and Headaches with sharp pains in head and neck No hx of stroke No hx of ca No hx of seizures  EXAM: CT HEAD WITHOUT CONTRAST  CT CERVICAL SPINE WITHOUT CONTRAST  TECHNIQUE: Multidetector CT imaging of the head and cervical spine was performed following the standard protocol without intravenous contrast. Multiplanar CT image reconstructions of the cervical spine were also generated.  COMPARISON:  12/12/2014.  FINDINGS: CT HEAD FINDINGS  Ventricles are normal in size, for this patient's age, and normal in configuration. There are no parenchymal masses or mass effect. There is no evidence of a cortical infarct. There are no extra-axial masses or abnormal fluid collections.  There is no intracranial hemorrhage.  Visualized sinuses and mastoid air cells are clear. No skull lesion or fracture.  CT CERVICAL SPINE FINDINGS  No fracture. No spondylolisthesis. There are disc degenerative changes throughout the cervical spine with moderate loss  disc height and endplate spurring and irregularity. Facet joints are relatively well preserved. There is a hypo attenuating lesion in the right lobe of the thyroid measuring 1.8 cm. This is stable from a CT cervical spine dated 02/22/2011. Soft tissues are otherwise unremarkable. Lung apices show stable areas of scarring.  IMPRESSION: 1. HEAD CT:  No acute intracranial abnormality.  No skull fracture. 2. CERVICAL CT:  No fracture or acute finding.   Electronically Signed   By: Amie Portland M.D.   On: 02/26/2015 08:27    MDM  Potassium supplement is not on patient's medication list however she reports that she does have potassium supplement at home. He has taken her Coumadin dose today I've advised her to  skip tomorrow's dose of Coumadin. She has appointment with Coumadin clinic for later this week. Also advised her to follow up with her psychiatrist regarding her medications and primary care physician in order to go over her complete medication list. Again at 5:10 p.m. she vehemently denies point to harm herself or others. Diagnoses #1 chronic depression #2 hypokalemia #3 mild Coumadin toxicity Final diagnoses:  None        Doug Sou, MD 03/22/15 1718

## 2015-03-23 ENCOUNTER — Telehealth: Payer: Self-pay | Admitting: Pharmacist

## 2015-03-23 NOTE — Telephone Encounter (Signed)
Patient called to let us know that she was seen in the ED on 03/22/15. Her INR was slightly elevated at 3.59. She had already taken her coumadin that day. She was instructed to hold her coumadin on 03/23/15 and resume coumadin on 03/24/15. She was seen in the ED for multiple reasons - most related to the stresses of taking care of her husband and son. Pt scheduled for lab and coumadin clinic visit on 03/29/15 - lab at 10:30am and coumadin clinic at 10:45am.

## 2015-03-25 ENCOUNTER — Emergency Department (HOSPITAL_COMMUNITY)
Admission: EM | Admit: 2015-03-25 | Discharge: 2015-03-25 | Disposition: A | Discharge status: Home / Self Care | Payer: Medicare Other | Attending: Emergency Medicine | Admitting: Emergency Medicine

## 2015-03-25 ENCOUNTER — Emergency Department (HOSPITAL_COMMUNITY): Payer: Medicare Other

## 2015-03-25 ENCOUNTER — Encounter (HOSPITAL_COMMUNITY): Payer: Self-pay | Admitting: Emergency Medicine

## 2015-03-25 DIAGNOSIS — Z872 Personal history of diseases of the skin and subcutaneous tissue: Secondary | ICD-10-CM | POA: Insufficient documentation

## 2015-03-25 DIAGNOSIS — Z8701 Personal history of pneumonia (recurrent): Secondary | ICD-10-CM | POA: Insufficient documentation

## 2015-03-25 DIAGNOSIS — R011 Cardiac murmur, unspecified: Secondary | ICD-10-CM | POA: Diagnosis not present

## 2015-03-25 DIAGNOSIS — G4730 Sleep apnea, unspecified: Secondary | ICD-10-CM | POA: Diagnosis not present

## 2015-03-25 DIAGNOSIS — G8929 Other chronic pain: Secondary | ICD-10-CM | POA: Insufficient documentation

## 2015-03-25 DIAGNOSIS — K219 Gastro-esophageal reflux disease without esophagitis: Secondary | ICD-10-CM | POA: Insufficient documentation

## 2015-03-25 DIAGNOSIS — Z9981 Dependence on supplemental oxygen: Secondary | ICD-10-CM | POA: Diagnosis not present

## 2015-03-25 DIAGNOSIS — F419 Anxiety disorder, unspecified: Secondary | ICD-10-CM | POA: Insufficient documentation

## 2015-03-25 DIAGNOSIS — M1990 Unspecified osteoarthritis, unspecified site: Secondary | ICD-10-CM | POA: Insufficient documentation

## 2015-03-25 DIAGNOSIS — J45909 Unspecified asthma, uncomplicated: Secondary | ICD-10-CM | POA: Diagnosis not present

## 2015-03-25 DIAGNOSIS — G4700 Insomnia, unspecified: Secondary | ICD-10-CM | POA: Diagnosis not present

## 2015-03-25 DIAGNOSIS — R51 Headache: Secondary | ICD-10-CM | POA: Insufficient documentation

## 2015-03-25 DIAGNOSIS — E876 Hypokalemia: Secondary | ICD-10-CM | POA: Diagnosis not present

## 2015-03-25 DIAGNOSIS — Z79899 Other long term (current) drug therapy: Secondary | ICD-10-CM | POA: Diagnosis not present

## 2015-03-25 DIAGNOSIS — Z7901 Long term (current) use of anticoagulants: Secondary | ICD-10-CM | POA: Diagnosis not present

## 2015-03-25 DIAGNOSIS — Z862 Personal history of diseases of the blood and blood-forming organs and certain disorders involving the immune mechanism: Secondary | ICD-10-CM | POA: Diagnosis not present

## 2015-03-25 DIAGNOSIS — Z86718 Personal history of other venous thrombosis and embolism: Secondary | ICD-10-CM | POA: Insufficient documentation

## 2015-03-25 DIAGNOSIS — Z86711 Personal history of pulmonary embolism: Secondary | ICD-10-CM | POA: Diagnosis not present

## 2015-03-25 DIAGNOSIS — F209 Schizophrenia, unspecified: Secondary | ICD-10-CM | POA: Insufficient documentation

## 2015-03-25 DIAGNOSIS — I10 Essential (primary) hypertension: Secondary | ICD-10-CM | POA: Diagnosis not present

## 2015-03-25 DIAGNOSIS — R519 Headache, unspecified: Secondary | ICD-10-CM

## 2015-03-25 DIAGNOSIS — F329 Major depressive disorder, single episode, unspecified: Secondary | ICD-10-CM | POA: Insufficient documentation

## 2015-03-25 LAB — CBC WITH DIFFERENTIAL/PLATELET
BASOS ABS: 0 10*3/uL (ref 0.0–0.1)
BASOS PCT: 0 % (ref 0–1)
EOS ABS: 0.2 10*3/uL (ref 0.0–0.7)
Eosinophils Relative: 3 % (ref 0–5)
HCT: 35.3 % — ABNORMAL LOW (ref 36.0–46.0)
Hemoglobin: 11.8 g/dL — ABNORMAL LOW (ref 12.0–15.0)
LYMPHS ABS: 1.5 10*3/uL (ref 0.7–4.0)
LYMPHS PCT: 23 % (ref 12–46)
MCH: 30.4 pg (ref 26.0–34.0)
MCHC: 33.4 g/dL (ref 30.0–36.0)
MCV: 91 fL (ref 78.0–100.0)
MONO ABS: 0.5 10*3/uL (ref 0.1–1.0)
Monocytes Relative: 8 % (ref 3–12)
Neutro Abs: 4.4 10*3/uL (ref 1.7–7.7)
Neutrophils Relative %: 66 % (ref 43–77)
Platelets: 213 10*3/uL (ref 150–400)
RBC: 3.88 MIL/uL (ref 3.87–5.11)
RDW: 14.8 % (ref 11.5–15.5)
WBC: 6.6 10*3/uL (ref 4.0–10.5)

## 2015-03-25 LAB — BASIC METABOLIC PANEL
ANION GAP: 9 (ref 5–15)
BUN: 7 mg/dL (ref 6–20)
CHLORIDE: 100 mmol/L — AB (ref 101–111)
CO2: 29 mmol/L (ref 22–32)
CREATININE: 0.96 mg/dL (ref 0.44–1.00)
Calcium: 9.5 mg/dL (ref 8.9–10.3)
GFR, EST NON AFRICAN AMERICAN: 58 mL/min — AB (ref >60)
Glucose, Bld: 102 mg/dL — ABNORMAL HIGH (ref 65–99)
POTASSIUM: 3.1 mmol/L — AB (ref 3.5–5.1)
Sodium: 138 mmol/L (ref 135–145)

## 2015-03-25 LAB — PROTIME-INR
INR: 3.59 — ABNORMAL HIGH (ref 0.00–1.49)
Prothrombin Time: 35.1 seconds — ABNORMAL HIGH (ref 11.6–15.2)

## 2015-03-25 LAB — SEDIMENTATION RATE: Sed Rate: 40 mm/hr — ABNORMAL HIGH (ref 0–22)

## 2015-03-25 MED ORDER — DIPHENHYDRAMINE HCL 50 MG/ML IJ SOLN
25.0000 mg | Freq: Once | INTRAMUSCULAR | Status: AC
  Administered 2015-03-25: 25 mg via INTRAVENOUS
  Filled 2015-03-25: qty 1

## 2015-03-25 MED ORDER — METOCLOPRAMIDE HCL 10 MG PO TABS: 10.0000 mg | ORAL_TABLET | Freq: Four times a day (QID) | ORAL | Status: DC | PRN

## 2015-03-25 MED ORDER — SODIUM CHLORIDE 0.9 % IV SOLN: 1000.0000 mL | INTRAVENOUS | Status: DC

## 2015-03-25 MED ORDER — SODIUM CHLORIDE 0.9 % IV SOLN
1000.0000 mL | Freq: Once | INTRAVENOUS | Status: AC
  Administered 2015-03-25: 1000 mL via INTRAVENOUS

## 2015-03-25 MED ORDER — METOCLOPRAMIDE HCL 5 MG/ML IJ SOLN
10.0000 mg | Freq: Once | INTRAMUSCULAR | Status: AC
  Administered 2015-03-25: 10 mg via INTRAVENOUS
  Filled 2015-03-25: qty 2

## 2015-03-25 NOTE — ED Provider Notes (Signed)
CSN: [id number]     Arrival date & time 03/25/15  0244 History   This chart was scribed for Dione Booze, MD by Arlan Organ, ED Scribe. This patient was seen in room A08C/A08C and the patient's care was started 3:48 AM.   Chief Complaint  Patient presents with  . Headache   The history is provided by the patient. No language interpreter was used.    HPI Comments: [name] [name] is a 74 y.o. female with a PMHx of hyperlipidemia, HTN who presents to the Emergency Department complaining of intermittent, ongoing, unchanged generalized HA x 1 weeks. Pain is described as throbbing and rated 8/10. No aggravating or alleviating factors at this time. Pt also reports ongoing insomnia secondary to sleep apnea.  No OTC medications or home remedies attempted prior to arrival. No recent fever, chills, visual changes, chest pain, or shortness of breath. [name] takes Coumadin daily for history of DVT. No known allergies to medications.  Past Medical History  Diagnosis Date  . Depression   . Schizophrenia   . Hyperlipidemia   . Hypertension   . Chronic pain     "over my whole body" (03/30/2013)  . 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 DVT (deep venous thrombosis)     per 01/17/10 d/c summary- "remote hx of dvt"?  . Iron deficiency anemia   . Glaucoma   . Hemorrhoids   . Asthma   . Anxiety   . GERD (gastroesophageal reflux disease)   . Psoriasis 04/29/2012    New onset evaluated by Dr Marylou Flesher, Dermatology, [facility] 6/13  Rx 0.1% Tacrolimus ointment  . Complication of anesthesia     "because I have sleep apnea" (03/30/2013)  . Heart murmur   . Chest pain, exertional   . Chronic bronchitis   . Sleep apnea     "waiting on my CPAP" (03/30/2013)  . Pneumonia     "a few times" (03/30/2013)  . Exertional shortness of breath     "& sometimes when laying down" (03/30/2013)  . Daily headache     "last 2 months" (03/30/2013)  . Arthritis     "joints" (03/30/2013)  . Varicose  veins of legs   . Psoriasis    Past Surgical History  Procedure Laterality Date  . Spinal fusion      2004  . Carpal tunnel release Bilateral   . Tubal ligation  1973  . Dilation and curettage of uterus  1970's    "once" (03/30/2013)  . 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 Joint replacement    . Vein surgery  left leg   Family History  Problem Relation Age of Onset  . Diabetes Sister   . Colon cancer Brother 40  . Colon cancer Brother    History  Substance Use Topics  . Smoking status: Never Smoker   . Smokeless tobacco: Never Used  . Alcohol Use: No   OB History    No data available     Review of Systems  Constitutional: Negative for fever and chills.  Respiratory: Negative for shortness of breath.   Cardiovascular: Negative for chest pain.  Gastrointestinal: Negative for nausea, vomiting and abdominal pain.  Neurological: Positive for headaches.  Psychiatric/Behavioral: Positive for sleep disturbance.  All other systems reviewed and are negative.     Allergies  Review of patient's allergies indicates no known allergies.  Home Medications   Prior to Admission medications   Medication Sig  Start Date End Date Taking? Authorizing Provider  acetaminophen (TYLENOL) 500 MG tablet Take 2 tablets (1,000 mg total) by mouth every 8 (eight) hours as needed. Patient not taking: Reported on 02/26/2015 11/22/14   Terri Piedra, PA-C  albuterol (PROVENTIL HFA;VENTOLIN HFA) 108 (90 BASE) MCG/ACT inhaler Inhale 2 puffs into the lungs every 6 (six) hours as needed for wheezing or shortness of breath (wheezing). 12/08/14   Tyrone Nine, MD  atenolol (TENORMIN) 100 MG tablet TAKE 1 TABLET BY MOUTH EVERY DAY 02/20/15   Tyrone Nine, MD  beclomethasone (QVAR) 80 MCG/ACT inhaler Inhale 2 puffs into the lungs 2 (two) times daily as needed (wheezing).     Historical Provider, MD  cyclobenzaprine (FLEXERIL) 10 MG tablet Take 1 tablet (10 mg total) by mouth 3 (three) times daily as  needed for muscle spasms. Patient not taking: Reported on 02/01/2015 12/08/14   Tyrone Nine, MD  diazepam (VALIUM) 2 MG tablet Take 1 tablet (2 mg total) by mouth every 12 (twelve) hours as needed (vertigo). Patient not taking: Reported on 02/01/2015 12/28/14   Gilda Crease, MD  Elastic Bandages & Supports (LUMBAR BACK BRACE/SUPPORT PAD) MISC 1 Device by Does not apply route daily as needed. 02/26/15   Arthor Captain, PA-C  esomeprazole (NEXIUM) 40 MG capsule Take 1 capsule (40 mg total) by mouth daily. 02/01/15   Arby Barrette, MD  FLUoxetine (PROZAC) 20 MG capsule Take 3 capsules (60 mg total) by mouth daily. 05/26/14   Tyrone Nine, MD  furosemide (LASIX) 20 MG tablet Take 1 tablet (20 mg total) by mouth daily. Patient taking differently: Take 20 mg by mouth as needed for fluid.  10/29/14   Tyrone Nine, MD  gabapentin (NEURONTIN) 400 MG capsule Take 1 capsule (400 mg total) by mouth 3 (three) times daily. Patient not taking: Reported on 03/22/2015 12/08/14   Tyrone Nine, MD  hydrOXYzine (ATARAX/VISTARIL) 10 MG tablet Take 1 tablet (10 mg total) by mouth at bedtime as needed for itching (itching). 10/29/14   Tyrone Nine, MD  lisinopril (PRINIVIL,ZESTRIL) 20 MG tablet Take 1 tablet (20 mg total) by mouth daily. 12/08/14   Tyrone Nine, MD  meclizine (ANTIVERT) 25 MG tablet Take 1 tablet (25 mg total) by mouth 3 (three) times daily as needed for dizziness. Patient not taking: Reported on 03/22/2015 02/01/15   Arby Barrette, MD  methocarbamol (ROBAXIN) 500 MG tablet Take 1 tablet (500 mg total) by mouth 3 (three) times daily between meals as needed. Patient not taking: Reported on 02/01/2015 12/12/14   Rolland Porter, MD  omeprazole (PRILOSEC) 40 MG capsule Take 1 capsule (40 mg total) by mouth daily. Patient not taking: Reported on 02/11/2015 08/31/14   Arby Barrette, MD  Ranitidine HCl (ZANTAC PO) Take 1 tablet by mouth daily.    Historical Provider, MD  silver sulfADIAZINE (SILVADENE) 1 % cream APPLY  TOPICALLY EVERY DAY Patient taking differently: APPLY TOPICALLY EVERY DAY BID on legs for dryness 12/10/14   Tyrone Nine, MD  traMADol (ULTRAM) 50 MG tablet Take 1 tablet (50 mg total) by mouth every 6 (six) hours as needed. Patient not taking: Reported on 03/22/2015 01/26/15   Harle Battiest, NP  triamcinolone cream (KENALOG) 0.1 % Apply 1 application topically 2 (two) times daily as needed (for legs). 10/29/14   Tyrone Nine, MD  warfarin (COUMADIN) 5 MG tablet TAKE 1 TABLET BY MOUTH EVERY DAY 10/18/14   Levert Feinstein, MD   Triage  Vitals: BP 171/92 mmHg  Pulse 62  Temp(Src) 97.4 F (36.3 C) (Oral)  Resp 18  SpO2 97%  LMP 11/22/1996   Physical Exam  Constitutional: She is oriented to person, place, and time. She appears well-developed and well-nourished. No distress.  HENT:  Head: Normocephalic and atraumatic.  Eyes: EOM are normal. Pupils are equal, round, and reactive to light.  Neck: Normal range of motion. Neck supple. No JVD present.  Cardiovascular: Normal rate, regular rhythm and normal heart sounds.   No murmur heard. Pulmonary/Chest: Effort normal and breath sounds normal. She has no wheezes. She has no rales. She exhibits no tenderness.  Abdominal: Soft. Bowel sounds are normal. She exhibits no distension. There is no tenderness.  Musculoskeletal: Normal range of motion. She exhibits no edema.  Lymphadenopathy:    She has no cervical adenopathy.  Neurological: She is alert and oriented to person, place, and time. No cranial nerve deficit. She exhibits normal muscle tone. Coordination normal.  Skin: Skin is warm and dry. No rash noted.  Psychiatric: She has a normal mood and affect. Her behavior is normal. Judgment and thought content normal.  Nursing note and vitals reviewed.   ED Course  Procedures (including critical care time)  DIAGNOSTIC STUDIES: Oxygen Saturation is 98% on RA, Normal by my interpretation.    COORDINATION OF CARE: 3:50 AM- Will give fluids,  Reglan, and Benadryl. Will order CT head without contrast, PT-INR, CBC, BMP, and sed rate.Discussed treatment plan with pt at bedside and pt agreed to plan.     Labs Review Results for orders placed or performed during the hospital encounter of 03/25/15  CBC with Differential  Result Value Ref Range   WBC 6.6 4.0 - 10.5 K/uL   RBC 3.88 3.87 - 5.11 MIL/uL   Hemoglobin 11.8 (L) 12.0 - 15.0 g/dL   HCT 13.0 (L) 86.5 - 78.4 %   MCV 91.0 78.0 - 100.0 fL   MCH 30.4 26.0 - 34.0 pg   MCHC 33.4 30.0 - 36.0 g/dL   RDW 69.6 29.5 - 28.4 %   Platelets 213 150 - 400 K/uL   Neutrophils Relative % 66 43 - 77 %   Neutro Abs 4.4 1.7 - 7.7 K/uL   Lymphocytes Relative 23 12 - 46 %   Lymphs Abs 1.5 0.7 - 4.0 K/uL   Monocytes Relative 8 3 - 12 %   Monocytes Absolute 0.5 0.1 - 1.0 K/uL   Eosinophils Relative 3 0 - 5 %   Eosinophils Absolute 0.2 0.0 - 0.7 K/uL   Basophils Relative 0 0 - 1 %   Basophils Absolute 0.0 0.0 - 0.1 K/uL  Basic metabolic panel  Result Value Ref Range   Sodium 138 135 - 145 mmol/L   Potassium 3.1 (L) 3.5 - 5.1 mmol/L   Chloride 100 (L) 101 - 111 mmol/L   CO2 29 22 - 32 mmol/L   Glucose, Bld 102 (H) 65 - 99 mg/dL   BUN 7 6 - 20 mg/dL   Creatinine, Ser 1.32 0.44 - 1.00 mg/dL   Calcium 9.5 8.9 - 44.0 mg/dL   GFR calc non Af Amer 58 (L) >60 mL/min   GFR calc Af Amer >60 >60 mL/min   Anion gap 9 5 - 15  Sedimentation rate  Result Value Ref Range   Sed Rate 40 (H) 0 - 22 mm/hr  Protime-INR  Result Value Ref Range   Prothrombin Time 35.1 (H) 11.6 - 15.2 seconds   INR 3.59 (H)  0.00 - 1.49   *Note: Due to a large number of results and/or encounters for the requested time period, some results have not been displayed. A complete set of results can be found in Results Review.    Imaging Review Ct Head Wo Contrast  03/25/2015   CLINICAL DATA:  Headache for 1 week.  A coagulated.  EXAM: CT HEAD WITHOUT CONTRAST  TECHNIQUE: Contiguous axial images were obtained from the base of the  skull through the vertex without intravenous contrast.  COMPARISON:  02/26/2015  FINDINGS: There is no intracranial hemorrhage, mass or evidence of acute infarction. There is mild generalized atrophy. There is mild chronic microvascular ischemic change. There is no significant extra-axial fluid collection.  No acute intracranial findings are evident. The visible paranasal sinuses are clear.  IMPRESSION: Normal brain   Electronically Signed   By: Ellery Plunkaniel R Mitchell M.D.   On: 03/25/2015 06:13    MDM   Final diagnoses:  Headache, unspecified headache type    Headache which seems benign. She will be treated with a migraine cocktail. Because of anticoagulation, she is sent for CT of the head which was unremarkable. Laboratory workup showed mild hypokalemia and mildly elevated sedimentation rate of 40. She had excellent relief of headache with IV metoclopramide and diphenhydramine. She is discharged with prescription for metoclopramide.  I personally performed the services described in this documentation, which was scribed in my presence. The recorded information has been reviewed and is accurate.     Dione Boozeavid Mikena Masoner, MD 03/25/15 [phone]

## 2015-03-25 NOTE — Discharge Instructions (Signed)
General Headache Without Cause °A headache is pain or discomfort felt around the head or neck area. The specific cause of a headache may not be found. There are many causes and types of headaches. A few common ones are: °· Tension headaches. °· Migraine headaches. °· Cluster headaches. °· Chronic daily headaches. °HOME CARE INSTRUCTIONS  °· Keep all follow-up appointments with your caregiver or any specialist referral. °· Only take over-the-counter or prescription medicines for pain or discomfort as directed by your caregiver. °· Lie down in a dark, quiet room when you have a headache. °· Keep a headache journal to find out what may trigger your migraine headaches. For example, write down: °¨ What you eat and drink. °¨ How much sleep you get. °¨ Any change to your diet or medicines. °· Try massage or other relaxation techniques. °· Put ice packs or heat on the head and neck. Use these 3 to 4 times per day for 15 to 20 minutes each time, or as needed. °· Limit stress. °· Sit up straight, and do not tense your muscles. °· Quit smoking if you smoke. °· Limit alcohol use. °· Decrease the amount of caffeine you drink, or stop drinking caffeine. °· Eat and sleep on a regular schedule. °· Get 7 to 9 hours of sleep, or as recommended by your caregiver. °· Keep lights dim if bright lights bother you and make your headaches worse. °SEEK MEDICAL CARE IF:  °· You have problems with the medicines you were prescribed. °· Your medicines are not working. °· You have a change from the usual headache. °· You have nausea or vomiting. °SEEK IMMEDIATE MEDICAL CARE IF:  °· Your headache becomes severe. °· You have a fever. °· You have a stiff neck. °· You have loss of vision. °· You have muscular weakness or loss of muscle control. °· You start losing your balance or have trouble walking. °· You feel faint or pass out. °· You have severe symptoms that are different from your first symptoms. °MAKE SURE YOU:  °· Understand these  instructions. °· Will watch your condition. °· Will get help right away if you are not doing well or get worse. °Document Released: 09/10/2005 Document Revised: 12/03/2011 Document Reviewed: 09/26/2011 °ExitCare® Patient Information ©2015 [employer]. This information is not intended to replace advice given to you by your health care provider. Make sure you discuss any questions you have with your health care provider. ° °Metoclopramide tablets °What is this medicine? °METOCLOPRAMIDE (met oh kloe PRA mide) is used to treat the symptoms of gastroesophageal reflux disease (GERD) like heartburn. It is also used to treat people with slow emptying of the stomach and intestinal tract. °This medicine may be used for other purposes; ask your health care provider or pharmacist if you have questions. °COMMON BRAND NAME(S): Reglan °What should I tell my health care provider before I take this medicine? °They need to know if you have any of these conditions: °-breast cancer °-depression °-diabetes °-heart failure °-high blood pressure °-kidney disease °-liver disease °-Parkinson's disease or a movement disorder °-pheochromocytoma °-seizures °-stomach obstruction, bleeding, or perforation °-an unusual or allergic reaction to metoclopramide, procainamide, sulfites, other medicines, foods, dyes, or preservatives °-pregnant or trying to get pregnant °-breast-feeding °How should I use this medicine? °Take this medicine by mouth with a glass of water. Follow the directions on the prescription label. Take this medicine on an empty stomach, about 30 minutes before eating. Take your doses at regular intervals. Do not take   your medicine more often than directed. Do not stop taking except on the advice of your doctor or health care professional. A special MedGuide will be given to you by the pharmacist with each prescription and refill. Be sure to read this information carefully each time. Talk to your pediatrician regarding the use  of this medicine in children. Special care may be needed. Overdosage: If you think you have taken too much of this medicine contact a poison control center or emergency room at once. NOTE: This medicine is only for you. Do not share this medicine with others. What if I miss a dose? If you miss a dose, take it as soon as you can. If it is almost time for your next dose, take only that dose. Do not take double or extra doses. What may interact with this medicine? -acetaminophen -cyclosporine -digoxin -medicines for blood pressure -medicines for diabetes, including insulin -medicines for hay fever and other allergies -medicines for depression, especially an Monoamine Oxidase Inhibitor (MAOI) -medicines for Parkinson's disease, like levodopa -medicines for sleep or for pain -tetracycline This list may not describe all possible interactions. Give your health care provider a list of all the medicines, herbs, non-prescription drugs, or dietary supplements you use. Also tell them if you smoke, drink alcohol, or use illegal drugs. Some items may interact with your medicine. What should I watch for while using this medicine? It may take a few weeks for your stomach condition to start to get better. However, do not take this medicine for longer than 12 weeks. The longer you take this medicine, and the more you take it, the greater your chances are of developing serious side effects. If you are an elderly patient, a female patient, or you have diabetes, you may be at an increased risk for side effects from this medicine. Contact your doctor immediately if you start having movements you cannot control such as lip smacking, rapid movements of the tongue, involuntary or uncontrollable movements of the eyes, head, arms and legs, or muscle twitches and spasms. Patients and their families should watch out for worsening depression or thoughts of suicide. Also watch out for any sudden or severe changes in feelings  such as feeling anxious, agitated, panicky, irritable, hostile, aggressive, impulsive, severely restless, overly excited and hyperactive, or not being able to sleep. If this happens, especially at the beginning of treatment or after a change in dose, call your doctor. Do not treat yourself for high fever. Ask your doctor or health care professional for advice. You may get drowsy or dizzy. Do not drive, use machinery, or do anything that needs mental alertness until you know how this drug affects you. Do not stand or sit up quickly, especially if you are an older patient. This reduces the risk of dizzy or fainting spells. Alcohol can make you more drowsy and dizzy. Avoid alcoholic drinks. What side effects may I notice from receiving this medicine? Side effects that you should report to your doctor or health care professional as soon as possible: -allergic reactions like skin rash, itching or hives, swelling of the face, lips, or tongue -abnormal production of milk in females -breast enlargement in both males and females -change in the way you walk -difficulty moving, speaking or swallowing -drooling, lip smacking, or rapid movements of the tongue -excessive sweating -fever -involuntary or uncontrollable movements of the eyes, head, arms and legs -irregular heartbeat or palpitations -muscle twitches and spasms -unusually weak or tired Side effects that usually do not  require medical attention (report to your doctor or health care professional if they continue or are bothersome): -change in sex drive or performance -depressed mood -diarrhea -difficulty sleeping -headache -menstrual changes -restless or nervous This list may not describe all possible side effects. Call your doctor for medical advice about side effects. You may report side effects to FDA at 1-800-FDA-1088. Where should I keep my medicine? Keep out of the reach of children. Store at room temperature between 20 and 25 degrees C  (68 and 77 degrees F). Protect from light. Keep container tightly closed. Throw away any unused medicine after the expiration date. NOTE: This sheet is a summary. It may not cover all possible information. If you have questions about this medicine, talk to your doctor, pharmacist, or health care provider.  2015, [employer]vier/[employer] Standard. (2012-01-08 13:04:38)

## 2015-03-25 NOTE — ED Notes (Signed)
Pt returned from ct scan

## 2015-03-25 NOTE — ED Notes (Signed)
Pt. reports headache and insomnia onset this week , pt.stated that she is in a lot of stress due to sick child in the hospital.

## 2015-03-29 ENCOUNTER — Other Ambulatory Visit (HOSPITAL_BASED_OUTPATIENT_CLINIC_OR_DEPARTMENT_OTHER): Payer: Medicare Other

## 2015-03-29 ENCOUNTER — Ambulatory Visit (HOSPITAL_BASED_OUTPATIENT_CLINIC_OR_DEPARTMENT_OTHER): Payer: Medicare Other | Admitting: Pharmacist

## 2015-03-29 DIAGNOSIS — D6851 Activated protein C resistance: Secondary | ICD-10-CM

## 2015-03-29 DIAGNOSIS — Z86711 Personal history of pulmonary embolism: Secondary | ICD-10-CM

## 2015-03-29 DIAGNOSIS — D688 Other specified coagulation defects: Secondary | ICD-10-CM

## 2015-03-29 DIAGNOSIS — Z7901 Long term (current) use of anticoagulants: Secondary | ICD-10-CM

## 2015-03-29 LAB — PROTIME-INR
INR: 1.6 — ABNORMAL LOW (ref 2.00–3.50)
Protime: 19.2 Seconds — ABNORMAL HIGH (ref 10.6–13.4)

## 2015-03-29 LAB — POCT INR: INR: 1.6
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
## 2015-03-29 NOTE — Patient Instructions (Signed)
Take 10mg  today then continue taking 5mg  (1 tablet) every day.  [facility]/29/15 at 10:30 for lab and 10:45 for [facility].

## 2015-04-08 ENCOUNTER — Encounter (HOSPITAL_COMMUNITY): Payer: Self-pay

## 2015-04-08 ENCOUNTER — Emergency Department (HOSPITAL_COMMUNITY): Payer: Medicare Other

## 2015-04-08 ENCOUNTER — Emergency Department (HOSPITAL_COMMUNITY)
Admission: EM | Admit: 2015-04-08 | Discharge: 2015-04-08 | Disposition: A | Discharge status: Home / Self Care | Payer: Medicare Other | Attending: Emergency Medicine | Admitting: Emergency Medicine

## 2015-04-08 DIAGNOSIS — G8929 Other chronic pain: Secondary | ICD-10-CM | POA: Diagnosis not present

## 2015-04-08 DIAGNOSIS — F209 Schizophrenia, unspecified: Secondary | ICD-10-CM | POA: Insufficient documentation

## 2015-04-08 DIAGNOSIS — Z86718 Personal history of other venous thrombosis and embolism: Secondary | ICD-10-CM | POA: Insufficient documentation

## 2015-04-08 DIAGNOSIS — M549 Dorsalgia, unspecified: Secondary | ICD-10-CM

## 2015-04-08 DIAGNOSIS — Z79899 Other long term (current) drug therapy: Secondary | ICD-10-CM | POA: Insufficient documentation

## 2015-04-08 DIAGNOSIS — Z96651 Presence of right artificial knee joint: Secondary | ICD-10-CM | POA: Insufficient documentation

## 2015-04-08 DIAGNOSIS — I10 Essential (primary) hypertension: Secondary | ICD-10-CM | POA: Insufficient documentation

## 2015-04-08 DIAGNOSIS — Z86711 Personal history of pulmonary embolism: Secondary | ICD-10-CM | POA: Diagnosis not present

## 2015-04-08 DIAGNOSIS — Y999 Unspecified external cause status: Secondary | ICD-10-CM | POA: Insufficient documentation

## 2015-04-08 DIAGNOSIS — J45909 Unspecified asthma, uncomplicated: Secondary | ICD-10-CM | POA: Insufficient documentation

## 2015-04-08 DIAGNOSIS — Z862 Personal history of diseases of the blood and blood-forming organs and certain disorders involving the immune mechanism: Secondary | ICD-10-CM | POA: Insufficient documentation

## 2015-04-08 DIAGNOSIS — Z7901 Long term (current) use of anticoagulants: Secondary | ICD-10-CM | POA: Diagnosis not present

## 2015-04-08 DIAGNOSIS — Z8639 Personal history of other endocrine, nutritional and metabolic disease: Secondary | ICD-10-CM | POA: Diagnosis not present

## 2015-04-08 DIAGNOSIS — Y9289 Other specified places as the place of occurrence of the external cause: Secondary | ICD-10-CM | POA: Insufficient documentation

## 2015-04-08 DIAGNOSIS — H409 Unspecified glaucoma: Secondary | ICD-10-CM | POA: Diagnosis not present

## 2015-04-08 DIAGNOSIS — S3992XA Unspecified injury of lower back, initial encounter: Secondary | ICD-10-CM | POA: Diagnosis present

## 2015-04-08 DIAGNOSIS — S8991XA Unspecified injury of right lower leg, initial encounter: Secondary | ICD-10-CM | POA: Diagnosis not present

## 2015-04-08 DIAGNOSIS — W1839XA Other fall on same level, initial encounter: Secondary | ICD-10-CM | POA: Insufficient documentation

## 2015-04-08 DIAGNOSIS — F419 Anxiety disorder, unspecified: Secondary | ICD-10-CM | POA: Insufficient documentation

## 2015-04-08 DIAGNOSIS — Y939 Activity, unspecified: Secondary | ICD-10-CM | POA: Diagnosis not present

## 2015-04-08 DIAGNOSIS — F329 Major depressive disorder, single episode, unspecified: Secondary | ICD-10-CM | POA: Diagnosis not present

## 2015-04-08 DIAGNOSIS — K219 Gastro-esophageal reflux disease without esophagitis: Secondary | ICD-10-CM | POA: Insufficient documentation

## 2015-04-08 DIAGNOSIS — M25569 Pain in unspecified knee: Secondary | ICD-10-CM

## 2015-04-08 MED ORDER — HYDROCODONE-ACETAMINOPHEN 5-325 MG PO TABS: 1.0000 | ORAL_TABLET | ORAL | Status: DC | PRN

## 2015-04-08 MED ORDER — HYDROCODONE-ACETAMINOPHEN 5-325 MG PO TABS
2.0000 | ORAL_TABLET | Freq: Once | ORAL | Status: AC
  Administered 2015-04-08: 2 via ORAL
  Filled 2015-04-08: qty 2

## 2015-04-08 NOTE — ED Notes (Signed)
PTAR at bedside 

## 2015-04-08 NOTE — ED Notes (Signed)
Pt has been moved from last residence due to renovations. This current residence is not handicapped assessable. Pt contines  to fall. Social worker involved to return pt to last residence now that renovation has been completed.

## 2015-04-08 NOTE — ED Notes (Signed)
ED PA at bedside

## 2015-04-08 NOTE — ED Provider Notes (Signed)
CSN: [id number]     Arrival date & time 04/08/15  0940 History   First MD Initiated Contact with Patient 04/08/15 [id number]     Chief Complaint  Patient presents with  . Fall  . Back Pain     (Consider location/radiation/quality/duration/timing/severity/associated sxs/prior Treatment) Patient is a 74 y.o. female presenting with fall and back pain. The history is provided by the patient and medical records.  Fall Associated symptoms include arthralgias.  Back Pain   This is a 74 year old female with past medical history significant for hypertension, hyperlipidemia, schizophrenia, depression, chronic pain, anxiety, GERD, presenting to the ED for a fall. Patient states she was standing in her kitchen making breakfast when her right knee that has been replaced previously "gave out on her". She states she fell into the trash can, but did not strike the floor. No head injury or loss of consciousness. Patient uses cane or walker to assist with ambulation at all times. She did not have this with her at time of fall. She denies dizziness or feelings of syncope. Patient has some right knee pain with bruise and back pain. Hx of chronic back pain.  No numbness/weakness of lower extremities.  no bowel or bladder incontinence.  Patient has current caseworker whom she works with that is trying to help get her back into former home.  She had to move out to make home handicap accessible for her son, states since being in the new residence she has tripped and fall several times due to the stairs.  Past Medical History  Diagnosis Date  . Depression   . Schizophrenia   . Hyperlipidemia   . Hypertension   . Chronic pain     "over my whole body" (03/30/2013)  . 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 DVT (deep venous thrombosis)     per 01/17/10 d/c summary- "remote hx of dvt"?  . Iron deficiency anemia   . Glaucoma   . Hemorrhoids   . Asthma   . Anxiety   . GERD (gastroesophageal reflux  disease)   . Psoriasis 04/29/2012    New onset evaluated by Dr Marylou FlesherWilliam Huang, [facility]ermat[facility], [facility] 6/13  Rx 0.1% Tacrolimus ointment  . Complication of anesthesia     "because I have sleep apnea" (03/30/2013)  . Heart murmur   . Chest pain, exertional   . Chronic bronchitis   . Sleep apnea     "waiting on my CPAP" (03/30/2013)  . Pneumonia     "a few times" (03/30/2013)  . Exertional shortness of breath     "& sometimes when laying down" (03/30/2013)  . Daily headache     "last 2 months" (03/30/2013)  . Arthritis     "joints" (03/30/2013)  . Varicose veins of legs   . Psoriasis    Past Surgical History  Procedure Laterality Date  . Spinal fusion      2004  . Carpal tunnel release Bilateral   . Tubal ligation  1973  . Dilation and curettage of uterus  1970's    "once" (03/30/2013)  . 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 Joint replacement    . Vein surgery  left leg   Family History  Problem Relation Age of Onset  . Diabetes Sister   . Colon cancer Brother 40  . Colon cancer Brother    History  Substance Use Topics  . Smoking status: Never Smoker   . Smokeless tobacco: Never Used  .  Alcohol Use: No   OB History    No data available     Review of Systems  Musculoskeletal: Positive for back pain and arthralgias.  All other systems reviewed and are negative.     Allergies  Review of patient's allergies indicates no known allergies.  Home Medications   Prior to Admission medications   Medication Sig Start Date End Date Taking? Authorizing Provider  acetaminophen (TYLENOL) 500 MG tablet Take 2 tablets (1,000 mg total) by mouth every 8 (eight) hours as needed. Patient not taking: Reported on 02/26/2015 11/22/14   Terri Piedra, PA-C  albuterol (PROVENTIL HFA;VENTOLIN HFA) 108 (90 BASE) MCG/ACT inhaler Inhale 2 puffs into the lungs every 6 (six) hours as needed for wheezing or shortness of breath (wheezing). 12/08/14   Tyrone Nine, MD  atenolol (TENORMIN) 100 MG  tablet TAKE 1 TABLET BY MOUTH EVERY DAY 02/20/15   Tyrone Nine, MD  beclomethasone (QVAR) 80 MCG/ACT inhaler Inhale 2 puffs into the lungs 2 (two) times daily as needed (wheezing).     Historical Provider, MD  cyclobenzaprine (FLEXERIL) 10 MG tablet Take 1 tablet (10 mg total) by mouth 3 (three) times daily as needed for muscle spasms. Patient not taking: Reported on 02/01/2015 12/08/14   Tyrone Nine, MD  diazepam (VALIUM) 2 MG tablet Take 1 tablet (2 mg total) by mouth every 12 (twelve) hours as needed (vertigo). Patient not taking: Reported on 02/01/2015 12/28/14   Gilda Crease, MD  Elastic Bandages & Supports (LUMBAR BACK BRACE/SUPPORT PAD) MISC 1 Device by Does not apply route daily as needed. 02/26/15   Arthor Captain, PA-C  esomeprazole (NEXIUM) 40 MG capsule Take 1 capsule (40 mg total) by mouth daily. 02/01/15   Arby Barrette, MD  FLUoxetine (PROZAC) 20 MG capsule Take 3 capsules (60 mg total) by mouth daily. 05/26/14   Tyrone Nine, MD  furosemide (LASIX) 20 MG tablet Take 1 tablet (20 mg total) by mouth daily. Patient taking differently: Take 20 mg by mouth as needed for fluid.  10/29/14   Tyrone Nine, MD  gabapentin (NEURONTIN) 400 MG capsule Take 1 capsule (400 mg total) by mouth 3 (three) times daily. Patient not taking: Reported on 03/22/2015 12/08/14   Tyrone Nine, MD  hydrOXYzine (ATARAX/VISTARIL) 10 MG tablet Take 1 tablet (10 mg total) by mouth at bedtime as needed for itching (itching). 10/29/14   Tyrone Nine, MD  lisinopril (PRINIVIL,ZESTRIL) 20 MG tablet Take 1 tablet (20 mg total) by mouth daily. 12/08/14   Tyrone Nine, MD  meclizine (ANTIVERT) 25 MG tablet Take 1 tablet (25 mg total) by mouth 3 (three) times daily as needed for dizziness. Patient not taking: Reported on 03/22/2015 02/01/15   Arby Barrette, MD  methocarbamol (ROBAXIN) 500 MG tablet Take 1 tablet (500 mg total) by mouth 3 (three) times daily between meals as needed. Patient not taking: Reported on 02/01/2015  12/12/14   Rolland Porter, MD  metoCLOPramide (REGLAN) 10 MG tablet Take 1 tablet (10 mg total) by mouth every 6 (six) hours as needed for nausea (or headache). 03/25/15   Dione Booze, MD  omeprazole (PRILOSEC) 40 MG capsule Take 1 capsule (40 mg total) by mouth daily. Patient not taking: Reported on 02/11/2015 08/31/14   Arby Barrette, MD  ranitidine (ZANTAC) 150 MG tablet Take 150 mg by mouth 2 (two) times daily.    Historical Provider, MD  silver sulfADIAZINE (SILVADENE) 1 % cream APPLY TOPICALLY EVERY DAY Patient taking  differently: APPLY TOPICALLY EVERY DAY BID on legs for dryness 12/10/14   Tyrone Nine, MD  traMADol (ULTRAM) 50 MG tablet Take 1 tablet (50 mg total) by mouth every 6 (six) hours as needed. Patient not taking: Reported on 03/22/2015 01/26/15   Harle Battiest, NP  triamcinolone cream (KENALOG) 0.1 % Apply 1 application topically 2 (two) times daily as needed (for legs). 10/29/14   Tyrone Nine, MD  warfarin (COUMADIN) 5 MG tablet TAKE 1 TABLET BY MOUTH EVERY DAY 10/18/14   Levert Feinstein, MD   BP 134/101 mmHg  Pulse 73  Temp(Src) 98.4 F (36.9 C) (Oral)  Resp 15  SpO2 97%  LMP 11/22/1996   Physical Exam  Constitutional: She is oriented to person, place, and time. She appears well-developed and well-nourished.  HENT:  Head: Normocephalic and atraumatic.  Mouth/Throat: Oropharynx is clear and moist.  No visible signs of head trauma  Eyes: Conjunctivae and EOM are normal. Pupils are equal, round, and reactive to light.  Neck: Normal range of motion.  Cardiovascular: Normal rate, regular rhythm and normal heart sounds.   Pulmonary/Chest: Effort normal and breath sounds normal. No respiratory distress. She has no wheezes.  Abdominal: Soft. Bowel sounds are normal.  Musculoskeletal: Normal range of motion.  Right knee with well headline midline TKA scar; bruising along medial joint line without bony deformity; no significant swelling or effusion; full ROM maintained; normal  strength and sensation  Generalized pain and tenderness of LS without acute deformity; full ROM maintained; normal strength and sensation of BLE; no saddle numbness/paresthesias Gait normal  Neurological: She is alert and oriented to person, place, and time.  AAOx3, answering questions appropriately; equal strength UE and LE bilaterally; CN grossly intact; moves all extremities appropriately without ataxia; no focal neuro deficits or facial asymmetry appreciated  Skin: Skin is warm and dry.  Psychiatric: She has a normal mood and affect.  Nursing note and vitals reviewed.   ED Course  Procedures (including critical care time) Labs Review Labs Reviewed - No data to display  Imaging Review Dg Lumbar Spine Complete  04/08/2015   CLINICAL DATA:  Fall. Back pain. Initial encounter. Worsening mid back pain after falls home recently.  EXAM: LUMBAR SPINE - COMPLETE 4+ VIEW  COMPARISON:  10/19/2014.  FINDINGS: No interval change in the L4-L5 rod and screw fixation. Interbody bone graft is present. Adjacent segment disease is present at L3-L4 with disc space narrowing and exaggerated lordosis. Severe L5-S1 degenerative disc disease is chronic. No acute displaced fracture. Sacrococcygeal alignment appears unchanged compared to prior exam 10/19/2014.  IMPRESSION: No acute osseous abnormality or interval change.  L4-L5 PLIF.   Electronically Signed   By: Andreas Newport M.D.   On: 04/08/2015 10:59   Dg Knee Complete 4 Views Right  04/08/2015   CLINICAL DATA:  Pain after recent falls  EXAM: RIGHT KNEE - COMPLETE 4+ VIEW  COMPARISON:  Feb 22, 2011  FINDINGS: Frontal, lateral, and bilateral oblique views were obtained. The patient is status post total knee replacement with the femoral and tibial prosthetic components appearing well-seated. No acute fracture or dislocation. No appreciable joint effusion. There is spurring arising from the patella, stable.  IMPRESSION: Prosthetic components appear well seated. No  fracture or dislocation. No appreciable joint effusion.   Electronically Signed   By: Bretta Bang III M.D.   On: 04/08/2015 10:59     EKG Interpretation None      MDM   Final diagnoses:  Knee  pain  Back pain, unspecified location   74 year old female with fall into trashcan after her right knee gave way earlier this morning. No head injury or loss of consciousness. Patient has bruising along the right knee and complains of back pain. She has history of chronic back pain. She has no focal neurologic deficits to suggest cauda equina.  VSS.  X-rays were obtained, negative for acute findings.  Patient has remained ambulatory in the ED with assistance which is her baseline.  Will d/c home with short supply pain meds.  Discussed plan with patient, he/she acknowledged understanding and agreed with plan of care.  Return precautions given for new or worsening symptoms.  Case discussed with attending physician, Dr. Madilyn Hook, who evaluated patient and agrees with assessment and plan of care.  Garlon Hatchet, PA-C 04/08/15 1247  Tilden Fossa, MD 04/09/15 [phone]

## 2015-04-08 NOTE — ED Notes (Signed)
Bed: WHALB Expected date:  Expected time:  Means of arrival:  Comments: 

## 2015-04-08 NOTE — ED Notes (Signed)
Bed: EA54WA10 Expected date:  Expected time:  Means of arrival:  Comments: EMS- 74yo F, fall, knee pain

## 2015-04-08 NOTE — Discharge Instructions (Signed)
Your x-rays today were normal. Take the prescribed medication as directed for pain.  This medication may cause drowsiness, use with caution. Follow-up with your primary care physician. Return to the ED for new or worsening symptoms.

## 2015-04-08 NOTE — ED Notes (Signed)
Per [facility]- Pt with witnessed fall by husband "knees gave out and caught herself". Pt c/o of back back. HX of back pain. NO LOC. Denies neck pain [facility] cleared.

## 2015-04-08 NOTE — ED Notes (Addendum)
Patient transported to X-ray 

## 2015-04-12 ENCOUNTER — Encounter: Payer: Self-pay | Admitting: Podiatry

## 2015-04-12 ENCOUNTER — Ambulatory Visit (INDEPENDENT_AMBULATORY_CARE_PROVIDER_SITE_OTHER): Payer: Medicare Other | Admitting: Podiatry

## 2015-04-12 DIAGNOSIS — M79673 Pain in unspecified foot: Secondary | ICD-10-CM

## 2015-04-12 DIAGNOSIS — B351 Tinea unguium: Secondary | ICD-10-CM | POA: Diagnosis not present

## 2015-04-12 NOTE — Progress Notes (Signed)
Patient ID: Lamont SnowballDora C Gad, female   DOB: 04/04/1941, 74 y.o.   MRN: [id number] .Complaint:  Visit Type: Patient returns to my office for continued preventative foot care services. Complaint: Patient states" my nails have grown long and thick and become painful to walk and wear shoes" Patient has been diagnosed with DM with neuropathy.Marland Kitchen. He presents for preventative foot care services. No changes to ROS  Podiatric Exam: Vascular: dorsalis pedis and posterior tibial pulses are palpable bilateral. Capillary return is immediate. Temperature gradient is WNL. Skin turgor WNL  Sensorium: Normal Semmes Weinstein monofilament test. Normal tactile sensation bilaterally. Nail Exam: Pt has thick disfigured discolored nails with subungual debris noted bilateral entire nail hallux through fifth toenails Ulcer Exam: There is no evidence of ulcer or pre-ulcerative changes or infection. Orthopedic Exam: Muscle tone and strength are WNL. No limitations in general ROM. No crepitus or effusions noted. Foot type and digits show no abnormalities. Bony prominences are unremarkable. Skin: No Porokeratosis. No infection or ulcers  Diagnosis:  Tinea unguium, Pain in right toe, pain in left toes  Treatment & Plan Procedures and Treatment: Consent by patient was obtained for treatment procedures. The patient understood the discussion of treatment and procedures well. All questions were answered thoroughly reviewed. Debridement of mycotic and hypertrophic toenails, 1 through 5 bilateral and clearing of subungual debris. No ulceration, no infection noted.  Return Visit-Office Procedure: Patient instructed to return to the office for a follow up visit 3 months for continued evaluation and treatment.

## 2015-04-24 ENCOUNTER — Emergency Department (HOSPITAL_COMMUNITY)
Admission: EM | Admit: 2015-04-24 | Discharge: 2015-04-26 | Disposition: A | Discharge status: Home / Self Care | Payer: Medicare Other | Attending: Emergency Medicine | Admitting: Emergency Medicine

## 2015-04-24 ENCOUNTER — Encounter (HOSPITAL_COMMUNITY): Payer: Self-pay | Admitting: Emergency Medicine

## 2015-04-24 DIAGNOSIS — Z872 Personal history of diseases of the skin and subcutaneous tissue: Secondary | ICD-10-CM | POA: Diagnosis not present

## 2015-04-24 DIAGNOSIS — F209 Schizophrenia, unspecified: Secondary | ICD-10-CM | POA: Diagnosis not present

## 2015-04-24 DIAGNOSIS — R45851 Suicidal ideations: Secondary | ICD-10-CM

## 2015-04-24 DIAGNOSIS — R45850 Homicidal ideations: Secondary | ICD-10-CM

## 2015-04-24 DIAGNOSIS — Z8701 Personal history of pneumonia (recurrent): Secondary | ICD-10-CM | POA: Insufficient documentation

## 2015-04-24 DIAGNOSIS — Z79899 Other long term (current) drug therapy: Secondary | ICD-10-CM | POA: Insufficient documentation

## 2015-04-24 DIAGNOSIS — Z86711 Personal history of pulmonary embolism: Secondary | ICD-10-CM | POA: Insufficient documentation

## 2015-04-24 DIAGNOSIS — E669 Obesity, unspecified: Secondary | ICD-10-CM | POA: Diagnosis not present

## 2015-04-24 DIAGNOSIS — I10 Essential (primary) hypertension: Secondary | ICD-10-CM | POA: Diagnosis not present

## 2015-04-24 DIAGNOSIS — G8929 Other chronic pain: Secondary | ICD-10-CM | POA: Insufficient documentation

## 2015-04-24 DIAGNOSIS — J45909 Unspecified asthma, uncomplicated: Secondary | ICD-10-CM | POA: Diagnosis not present

## 2015-04-24 DIAGNOSIS — Z008 Encounter for other general examination: Secondary | ICD-10-CM | POA: Diagnosis present

## 2015-04-24 DIAGNOSIS — K219 Gastro-esophageal reflux disease without esophagitis: Secondary | ICD-10-CM | POA: Diagnosis not present

## 2015-04-24 DIAGNOSIS — Z049 Encounter for examination and observation for unspecified reason: Secondary | ICD-10-CM

## 2015-04-24 DIAGNOSIS — R011 Cardiac murmur, unspecified: Secondary | ICD-10-CM | POA: Diagnosis not present

## 2015-04-24 DIAGNOSIS — M1990 Unspecified osteoarthritis, unspecified site: Secondary | ICD-10-CM | POA: Diagnosis not present

## 2015-04-24 DIAGNOSIS — H409 Unspecified glaucoma: Secondary | ICD-10-CM | POA: Diagnosis not present

## 2015-04-24 DIAGNOSIS — Z7901 Long term (current) use of anticoagulants: Secondary | ICD-10-CM | POA: Insufficient documentation

## 2015-04-24 DIAGNOSIS — F419 Anxiety disorder, unspecified: Secondary | ICD-10-CM | POA: Insufficient documentation

## 2015-04-24 DIAGNOSIS — Z86718 Personal history of other venous thrombosis and embolism: Secondary | ICD-10-CM | POA: Diagnosis not present

## 2015-04-24 DIAGNOSIS — Z8719 Personal history of other diseases of the digestive system: Secondary | ICD-10-CM | POA: Diagnosis not present

## 2015-04-24 DIAGNOSIS — F329 Major depressive disorder, single episode, unspecified: Secondary | ICD-10-CM | POA: Insufficient documentation

## 2015-04-24 DIAGNOSIS — F332 Major depressive disorder, recurrent severe without psychotic features: Secondary | ICD-10-CM | POA: Diagnosis present

## 2015-04-24 DIAGNOSIS — E785 Hyperlipidemia, unspecified: Secondary | ICD-10-CM | POA: Insufficient documentation

## 2015-04-24 LAB — PROTIME-INR
INR: 2.78 — ABNORMAL HIGH (ref 0.00–1.49)
Prothrombin Time: 28.9 seconds — ABNORMAL HIGH (ref 11.6–15.2)

## 2015-04-24 LAB — CBC
HEMATOCRIT: 35.3 % — AB (ref 36.0–46.0)
Hemoglobin: 11.7 g/dL — ABNORMAL LOW (ref 12.0–15.0)
MCH: 31 pg (ref 26.0–34.0)
MCHC: 33.1 g/dL (ref 30.0–36.0)
MCV: 93.4 fL (ref 78.0–100.0)
Platelets: 229 10*3/uL (ref 150–400)
RBC: 3.78 MIL/uL — ABNORMAL LOW (ref 3.87–5.11)
RDW: 14.5 % (ref 11.5–15.5)
WBC: 7 10*3/uL (ref 4.0–10.5)

## 2015-04-24 LAB — COMPREHENSIVE METABOLIC PANEL
ALBUMIN: 4 g/dL (ref 3.5–5.0)
ALK PHOS: 105 U/L (ref 38–126)
ALT: 20 U/L (ref 14–54)
ANION GAP: 10 (ref 5–15)
AST: 36 U/L (ref 15–41)
BILIRUBIN TOTAL: 0.4 mg/dL (ref 0.3–1.2)
BUN: 11 mg/dL (ref 6–20)
CHLORIDE: 100 mmol/L — AB (ref 101–111)
CO2: 27 mmol/L (ref 22–32)
Calcium: 9.7 mg/dL (ref 8.9–10.3)
Creatinine, Ser: 1.08 mg/dL — ABNORMAL HIGH (ref 0.44–1.00)
GFR calc Af Amer: 58 mL/min — ABNORMAL LOW (ref >60)
GFR, EST NON AFRICAN AMERICAN: 50 mL/min — AB (ref >60)
Glucose, Bld: 114 mg/dL — ABNORMAL HIGH (ref 65–99)
POTASSIUM: 3.2 mmol/L — AB (ref 3.5–5.1)
SODIUM: 137 mmol/L (ref 135–145)
Total Protein: 8.4 g/dL — ABNORMAL HIGH (ref 6.5–8.1)

## 2015-04-24 LAB — ETHANOL: Alcohol, Ethyl (B): <5 mg/dL (ref <5)

## 2015-04-24 LAB — POTASSIUM: Potassium: 3 mmol/L — ABNORMAL LOW (ref 3.5–5.1)

## 2015-04-24 MED ORDER — HYDROXYZINE HCL 10 MG PO TABS
10.0000 mg | ORAL_TABLET | Freq: Every evening | ORAL | Status: DC | PRN
  Filled 2015-04-24: qty 1

## 2015-04-24 MED ORDER — BUDESONIDE 0.25 MG/2ML IN SUSP: 0.2500 mg | Freq: Two times a day (BID) | RESPIRATORY_TRACT | Status: DC | PRN

## 2015-04-24 MED ORDER — WARFARIN SODIUM 5 MG PO TABS
5.0000 mg | ORAL_TABLET | Freq: Once | ORAL | Status: AC
  Administered 2015-04-24: 5 mg via ORAL
  Filled 2015-04-24: qty 1

## 2015-04-24 MED ORDER — ATENOLOL 100 MG PO TABS
100.0000 mg | ORAL_TABLET | Freq: Every day | ORAL | Status: DC
  Administered 2015-04-24 – 2015-04-26 (×3): 100 mg via ORAL
  Filled 2015-04-24 (×3): qty 1

## 2015-04-24 MED ORDER — ZOLPIDEM TARTRATE 5 MG PO TABS
5.0000 mg | ORAL_TABLET | Freq: Every evening | ORAL | Status: DC | PRN
  Administered 2015-04-24 – 2015-04-25 (×2): 5 mg via ORAL
  Filled 2015-04-24 (×2): qty 1

## 2015-04-24 MED ORDER — LISINOPRIL 20 MG PO TABS
20.0000 mg | ORAL_TABLET | Freq: Every day | ORAL | Status: DC
  Administered 2015-04-24 – 2015-04-26 (×3): 20 mg via ORAL
  Filled 2015-04-24 (×3): qty 1

## 2015-04-24 MED ORDER — MECLIZINE HCL 25 MG PO TABS: 25.0000 mg | ORAL_TABLET | Freq: Three times a day (TID) | ORAL | Status: DC | PRN

## 2015-04-24 MED ORDER — PANTOPRAZOLE SODIUM 40 MG PO TBEC
40.0000 mg | DELAYED_RELEASE_TABLET | Freq: Every day | ORAL | Status: DC
  Administered 2015-04-24 – 2015-04-26 (×3): 40 mg via ORAL
  Filled 2015-04-24 (×3): qty 1

## 2015-04-24 MED ORDER — AMLODIPINE BESYLATE 10 MG PO TABS
10.0000 mg | ORAL_TABLET | Freq: Every day | ORAL | Status: DC
  Administered 2015-04-24 – 2015-04-26 (×3): 10 mg via ORAL
  Filled 2015-04-24 (×3): qty 1

## 2015-04-24 MED ORDER — ACETAMINOPHEN 325 MG PO TABS
650.0000 mg | ORAL_TABLET | ORAL | Status: DC | PRN
  Filled 2015-04-24: qty 2

## 2015-04-24 MED ORDER — FLUOXETINE HCL 20 MG PO CAPS
60.0000 mg | ORAL_CAPSULE | Freq: Every day | ORAL | Status: DC
  Administered 2015-04-24 – 2015-04-26 (×3): 60 mg via ORAL
  Filled 2015-04-24 (×3): qty 3

## 2015-04-24 MED ORDER — FLUTICASONE PROPIONATE HFA 44 MCG/ACT IN AERO
2.0000 | INHALATION_SPRAY | Freq: Two times a day (BID) | RESPIRATORY_TRACT | Status: DC
  Administered 2015-04-24 – 2015-04-26 (×4): 2 via RESPIRATORY_TRACT
  Filled 2015-04-24: qty 10.6

## 2015-04-24 MED ORDER — BECLOMETHASONE DIPROPIONATE 80 MCG/ACT IN AERS: 2.0000 | INHALATION_SPRAY | Freq: Two times a day (BID) | RESPIRATORY_TRACT | Status: DC | PRN

## 2015-04-24 MED ORDER — IBUPROFEN 200 MG PO TABS
600.0000 mg | ORAL_TABLET | Freq: Three times a day (TID) | ORAL | Status: DC | PRN
  Administered 2015-04-24 – 2015-04-25 (×2): 600 mg via ORAL
  Filled 2015-04-24 (×2): qty 3

## 2015-04-24 MED ORDER — NICOTINE 21 MG/24HR TD PT24: 21.0000 mg | MEDICATED_PATCH | Freq: Every day | TRANSDERMAL | Status: DC | PRN

## 2015-04-24 MED ORDER — POTASSIUM CHLORIDE CRYS ER 20 MEQ PO TBCR
20.0000 meq | EXTENDED_RELEASE_TABLET | Freq: Two times a day (BID) | ORAL | Status: DC
  Administered 2015-04-24 – 2015-04-26 (×4): 20 meq via ORAL
  Filled 2015-04-24 (×4): qty 1

## 2015-04-24 MED ORDER — WARFARIN - PHYSICIAN DOSING INPATIENT: Freq: Every day | Status: DC

## 2015-04-24 MED ORDER — ALUM & MAG HYDROXIDE-SIMETH 200-200-20 MG/5ML PO SUSP: 30.0000 mL | ORAL | Status: DC | PRN

## 2015-04-24 MED ORDER — FLUTICASONE PROPIONATE (INHAL) 250 MCG/BLIST IN AEPB: 2.0000 | INHALATION_SPRAY | Freq: Two times a day (BID) | RESPIRATORY_TRACT | Status: DC

## 2015-04-24 MED ORDER — FUROSEMIDE 40 MG PO TABS
20.0000 mg | ORAL_TABLET | Freq: Every day | ORAL | Status: DC
  Administered 2015-04-24 – 2015-04-26 (×3): 20 mg via ORAL
  Filled 2015-04-24 (×3): qty 1

## 2015-04-24 MED ORDER — GABAPENTIN 400 MG PO CAPS
400.0000 mg | ORAL_CAPSULE | Freq: Three times a day (TID) | ORAL | Status: DC
  Administered 2015-04-24 – 2015-04-26 (×6): 400 mg via ORAL
  Filled 2015-04-24 (×6): qty 1

## 2015-04-24 MED ORDER — ONDANSETRON HCL 4 MG PO TABS: 4.0000 mg | ORAL_TABLET | Freq: Three times a day (TID) | ORAL | Status: DC | PRN
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
## 2015-04-24 NOTE — BH Assessment (Signed)
Assessment completed. Consulted Maryjean Morn, PA-C who recommended a social work consult and a reassessment by psychiatry in the morning. Dr. Effie Shy has been informed of the recommendation.

## 2015-04-24 NOTE — ED Notes (Addendum)
Charge Nurse Patty gave pt $19 from pt's son, Apple Dearmas.
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
## 2015-04-24 NOTE — Progress Notes (Signed)
6:04pm. CSW requested well-check to pt's residence to check on pt's husband, [name]. CSW spoke with [employer]. White reports [name] is well and staying with neighbors.  [facility] Clinical Social Worker [facility] phone: [phone]

## 2015-04-24 NOTE — ED Notes (Signed)
Bed: WA28 Expected date:  Expected time:  Means of arrival:  Comments: 

## 2015-04-24 NOTE — ED Provider Notes (Signed)
CSN: [id number]     Arrival date & time 04/24/15  1610 History   First MD Initiated Contact with Patient 04/24/15 1818     Chief Complaint  Patient presents with  . Medical Clearance     (Consider location/radiation/quality/duration/timing/severity/associated sxs/prior Treatment) HPI   [name] [name] is a 74 y.o. female who presents for evaluation of homicidal ideation. She was coming to the emergency department with her son, so that he could be evaluated for a feeding tube problem. During the transport, she informed attendants that she wanted to kill her husband. She stated that she was going to "burn him down." She also told them that she would kill her son, if she has to take him back home. She states that she is not seeing a therapist or psychiatrist at this time because she does not believe that she needs them. She denies recent fever, chills, cough, chest pain, weakness or dizziness. It is not clear if she is taking her medications, but she states that she is. There are no other known modifying factors.   Past Medical History  Diagnosis Date  . Depression   . Schizophrenia   . Hyperlipidemia   . Hypertension   . Chronic pain     "over my whole body" (03/30/2013)  . 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 DVT (deep venous thrombosis)     per 01/17/10 d/c summary- "remote hx of dvt"?  . Iron deficiency anemia   . Glaucoma   . Hemorrhoids   . Asthma   . Anxiety   . GERD (gastroesophageal reflux disease)   . Psoriasis 04/29/2012    New onset evaluated by Dr Marylou Flesher, Dermatology, [facility] 6/13  Rx 0.1% Tacrolimus ointment  . Complication of anesthesia     "because I have sleep apnea" (03/30/2013)  . Heart murmur   . Chest pain, exertional   . Chronic bronchitis   . Sleep apnea     "waiting on my CPAP" (03/30/2013)  . Pneumonia     "a few times" (03/30/2013)  . Exertional shortness of breath     "& sometimes when laying down" (03/30/2013)  . Daily  headache     "last 2 months" (03/30/2013)  . Arthritis     "joints" (03/30/2013)  . Varicose veins of legs   . Psoriasis    Past Surgical History  Procedure Laterality Date  . Spinal fusion      2004  . Carpal tunnel release Bilateral   . Tubal ligation  1973  . Dilation and curettage of uterus  1970's    "once" (03/30/2013)  . 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 Joint replacement    . Vein surgery  left leg   Family History  Problem Relation Age of Onset  . Diabetes Sister   . Colon cancer Brother 40  . Colon cancer Brother    History  Substance Use Topics  . Smoking status: Never Smoker   . Smokeless tobacco: Never Used  . Alcohol Use: No   OB History    No data available     Review of Systems  All other systems reviewed and are negative.     Allergies  Review of patient's allergies indicates no known allergies.  Home Medications   Prior to Admission medications   Medication Sig Start Date End Date Taking? Authorizing Provider  acetaminophen (TYLENOL) 500 MG tablet Take 2 tablets (1,000 mg total) by mouth every 8 (  eight) hours as needed. Patient taking differently: Take 1,000 mg by mouth every 8 (eight) hours as needed for mild pain, moderate pain or headache.  11/22/14  Yes Courtney Forcucci, PA-C  albuterol (PROVENTIL HFA;VENTOLIN HFA) 108 (90 BASE) MCG/ACT inhaler Inhale 2 puffs into the lungs every 6 (six) hours as needed for wheezing or shortness of breath (wheezing). 12/08/14  Yes Tyrone Nine, MD  amLODipine (NORVASC) 10 MG tablet Take 10 mg by mouth daily. 04/18/15  Yes Historical Provider, MD  atenolol (TENORMIN) 100 MG tablet TAKE 1 TABLET BY MOUTH EVERY DAY Patient taking differently: TAKE 100 MG BY MOUTH DAILY. 02/20/15  Yes Tyrone Nine, MD  beclomethasone (QVAR) 80 MCG/ACT inhaler Inhale 2 puffs into the lungs 2 (two) times daily as needed (wheezing).    Yes Historical Provider, MD  esomeprazole (NEXIUM) 40 MG capsule Take 1 capsule (40 mg total) by  mouth daily. 02/01/15  Yes Arby Barrette, MD  FLOVENT DISKUS 250 MCG/BLIST AEPB Inhale 2 puffs into the lungs 2 (two) times daily. 04/20/15  Yes Historical Provider, MD  FLUoxetine (PROZAC) 20 MG capsule Take 3 capsules (60 mg total) by mouth daily. 05/26/14  Yes Tyrone Nine, MD  furosemide (LASIX) 20 MG tablet Take 1 tablet (20 mg total) by mouth daily. 10/29/14  Yes Tyrone Nine, MD  gabapentin (NEURONTIN) 400 MG capsule Take 1 capsule (400 mg total) by mouth 3 (three) times daily. 12/08/14  Yes Tyrone Nine, MD  hydrOXYzine (ATARAX/VISTARIL) 10 MG tablet Take 1 tablet (10 mg total) by mouth at bedtime as needed for itching (itching). 10/29/14  Yes Tyrone Nine, MD  lisinopril (PRINIVIL,ZESTRIL) 20 MG tablet Take 1 tablet (20 mg total) by mouth daily. 12/08/14  Yes Tyrone Nine, MD  meclizine (ANTIVERT) 25 MG tablet Take 1 tablet (25 mg total) by mouth 3 (three) times daily as needed for dizziness. 02/01/15  Yes Arby Barrette, MD  silver sulfADIAZINE (SILVADENE) 1 % cream APPLY TOPICALLY EVERY DAY Patient taking differently: Apply Topically to Legs and body as needed for itching/rash. Alternates daily. 12/10/14  Yes Tyrone Nine, MD  traMADol (ULTRAM) 50 MG tablet Take 1 tablet (50 mg total) by mouth every 6 (six) hours as needed. Patient taking differently: Take 50 mg by mouth every 6 (six) hours as needed for moderate pain or severe pain.  01/26/15  Yes Harle Battiest, NP  triamcinolone cream (KENALOG) 0.1 % Apply 1 application topically 2 (two) times daily as needed (for legs). 10/29/14  Yes Tyrone Nine, MD  warfarin (COUMADIN) 5 MG tablet TAKE 1 TABLET BY MOUTH EVERY DAY Patient taking differently: TAKE 5 MG BY MOUTH DAILY. 10/18/14  Yes Levert Feinstein, MD  omeprazole (PRILOSEC) 40 MG capsule Take 1 capsule (40 mg total) by mouth daily. Patient not taking: Reported on 04/24/2015 08/31/14   Arby Barrette, MD   BP 109/70 mmHg  Pulse 59  Temp(Src) 99.1 F (37.3 C) (Oral)  Resp 16  SpO2 95%  LMP  11/22/1996 Physical Exam  Constitutional: She is oriented to person, place, and time. She appears well-developed.  Elderly, obese  HENT:  Head: Normocephalic and atraumatic.  Right Ear: External ear normal.  Left Ear: External ear normal.  Eyes: Conjunctivae and EOM are normal. Pupils are equal, round, and reactive to light.  Neck: Normal range of motion and phonation normal. Neck supple.  Cardiovascular: Normal rate, regular rhythm and normal heart sounds.   Pulmonary/Chest: Effort normal and breath sounds normal.  She exhibits no bony tenderness.  Abdominal: Soft. There is no tenderness.  Musculoskeletal: Normal range of motion.  Neurological: She is alert and oriented to person, place, and time. No cranial nerve deficit or sensory deficit. She exhibits normal muscle tone. Coordination normal.  Skin: Skin is warm, dry and intact.  Psychiatric:  She is somewhat agitated. Rambling statements, and tangential thought process.  Nursing note and vitals reviewed.   ED Course  Procedures (including critical care time)  Medications  amLODipine (NORVASC) tablet 10 mg (10 mg Oral Given 04/24/15 2116)  atenolol (TENORMIN) tablet 100 mg (100 mg Oral Given 04/24/15 2115)  pantoprazole (PROTONIX) EC tablet 40 mg (40 mg Oral Given 04/24/15 2115)  FLUoxetine (PROZAC) capsule 60 mg (60 mg Oral Given 04/24/15 2115)  furosemide (LASIX) tablet 20 mg (20 mg Oral Given 04/24/15 2116)  gabapentin (NEURONTIN) capsule 400 mg (400 mg Oral Given 04/24/15 2116)  hydrOXYzine (ATARAX/VISTARIL) tablet 10 mg (not administered)  lisinopril (PRINIVIL,ZESTRIL) tablet 20 mg (20 mg Oral Given 04/24/15 2116)  meclizine (ANTIVERT) tablet 25 mg (not administered)  potassium chloride SA (K-DUR,KLOR-CON) CR tablet 20 mEq (20 mEq Oral Given 04/24/15 2116)  acetaminophen (TYLENOL) tablet 650 mg (not administered)  ibuprofen (ADVIL,MOTRIN) tablet 600 mg (600 mg Oral Given 04/24/15 2115)  zolpidem (AMBIEN) tablet 5 mg (5 mg Oral  Given 04/24/15 2216)  nicotine (NICODERM CQ - dosed in mg/24 hours) patch 21 mg (not administered)  ondansetron (ZOFRAN) tablet 4 mg (not administered)  alum & mag hydroxide-simeth (MAALOX/MYLANTA) 200-200-20 MG/5ML suspension 30 mL (not administered)  fluticasone (FLOVENT HFA) 44 MCG/ACT inhaler 2 puff (2 puffs Inhalation Given 04/24/15 2115)  budesonide (PULMICORT) nebulizer solution 0.25 mg (not administered)  Warfarin - Physician Dosing Inpatient (not administered)  warfarin (COUMADIN) tablet 5 mg (5 mg Oral Given 04/24/15 2216)    Patient Vitals for the past 24 hrs:  BP Temp Temp src Pulse Resp SpO2  04/24/15 2106 109/70 mmHg 99.1 F (37.3 C) - (!) 59 16 95 %  04/24/15 1754 144/80 mmHg 98.3 F (36.8 C) Oral (!) 57 18 98 %  04/24/15 1625 (!) 161/102 mmHg - - 68 18 100 %    8:05 PM Reevaluation with update and discussion. After initial assessment and treatment, an updated evaluation reveals medical screening exam indicates mild hypokalemia. She is on diaphoretic, so that is the likely cause. Will start regular potassium supplementation. At this time. She is medically cleared for treatment by psychiatric services. Latravis Grine L   20:10- consult TTS  Labs Review Labs Reviewed  COMPREHENSIVE METABOLIC PANEL - Abnormal; Notable for the following:    Potassium 3.2 (*)    Chloride 100 (*)    Glucose, Bld 114 (*)    Creatinine, Ser 1.08 (*)    Total Protein 8.4 (*)    GFR calc non Af Amer 50 (*)    GFR calc Af Amer 58 (*)    All other components within normal limits  CBC - Abnormal; Notable for the following:    RBC 3.78 (*)    Hemoglobin 11.7 (*)    HCT 35.3 (*)    All other components within normal limits  POTASSIUM - Abnormal; Notable for the following:    Potassium 3.0 (*)    All other components within normal limits  PROTIME-INR - Abnormal; Notable for the following:    Prothrombin Time 28.9 (*)    INR 2.78 (*)    All other components within normal limits  ETHANOL   URINE RAPID  DRUG SCREEN, HOSP PERFORMED    Imaging Review No results found.   EKG Interpretation None      MDM   Final diagnoses:  Suicidal ideation  Homicidal ideation    Unstable psychiatric condition, with both homicidal and suicidal ideation. Patient is a very difficult home life with a chronically ill child who requires nearly full, total care, and she is the primary caregiver. It is not clear how much the husband is involved or what his current status is. She will require stabilization, or admission.  Nursing Notes Reviewed/ Care Coordinated, and agree without changes. Applicable Imaging Reviewed.  Interpretation of Laboratory Data incorporated into ED treatment  Plan: As per TTS, and social work in conjunction with oncoming provider team.    Mancel Bale, MD 04/24/15 [phone]

## 2015-04-25 ENCOUNTER — Emergency Department (HOSPITAL_COMMUNITY): Payer: Medicare Other

## 2015-04-25 LAB — PROTIME-INR
INR: 2.51 — ABNORMAL HIGH (ref 0.00–1.49)
Prothrombin Time: 26.8 seconds — ABNORMAL HIGH (ref 11.6–15.2)

## 2015-04-25 LAB — RAPID URINE DRUG SCREEN, HOSP PERFORMED
Amphetamines: NOT DETECTED
BARBITURATES: NOT DETECTED
BENZODIAZEPINES: NOT DETECTED
Cocaine: NOT DETECTED
Opiates: NOT DETECTED
Tetrahydrocannabinol: NOT DETECTED

## 2015-04-25 MED ORDER — TRAMADOL HCL 50 MG PO TABS
50.0000 mg | ORAL_TABLET | Freq: Four times a day (QID) | ORAL | Status: DC | PRN
  Administered 2015-04-25 – 2015-04-26 (×4): 50 mg via ORAL
  Filled 2015-04-25 (×4): qty 1

## 2015-04-25 MED ORDER — WARFARIN SODIUM 5 MG PO TABS
5.0000 mg | ORAL_TABLET | Freq: Every day | ORAL | Status: DC
  Administered 2015-04-25 – 2015-04-26 (×2): 5 mg via ORAL
  Filled 2015-04-25 (×2): qty 1

## 2015-04-25 NOTE — Progress Notes (Signed)
CM and TTS staff asked by TCU RN if pt could be assisted with getting resources for her son, getting him in a facility with his check CM referred RN to pt's family contact - her daughter as number listed in EPIC as [place] M. Daughter [phone] Pt's son is not a pt but family can assist in getting son's pcp to assist
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
## 2015-04-25 NOTE — BH Assessment (Signed)
BHH Assessment Progress Note  The following facilities have been contacted to seek placement for this pt, with results as noted:  Beds available, information sent, decision pending:  [place]  No beds available, but accepting referrals for future consideration; information sent:  [facility]  At capacity:  [name]   [place], [place] Triage Specialist [phone]
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
## 2015-04-25 NOTE — ED Notes (Signed)
Patient is resting comfortably. 

## 2015-04-26 DIAGNOSIS — R45850 Homicidal ideations: Secondary | ICD-10-CM

## 2015-04-26 DIAGNOSIS — F332 Major depressive disorder, recurrent severe without psychotic features: Secondary | ICD-10-CM

## 2015-04-26 LAB — PROTIME-INR
INR: 2.91 — ABNORMAL HIGH (ref 0.00–1.49)
Prothrombin Time: 29.9 seconds — ABNORMAL HIGH (ref 11.6–15.2)
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
## 2015-04-26 NOTE — Progress Notes (Signed)
CSw received call from Anthony at [facility]. Patient accepted to [facility] pending bed this afternoon. CSW/tts awaiting return call once bed is available.   Olga Coaster, LCSW  Clinical Social Work  [employer] [phone]

## 2015-04-26 NOTE — ED Notes (Signed)
Psych MD at bedside
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
## 2015-04-26 NOTE — Progress Notes (Signed)
Delorise Shiner of [facility] reached out to CSW and stated that the patient has been accepted to their facility and is welcomed to come.  Accepting of Physician: Aletha Halim Nurse Report Number: [phone]  CSW will make nurse aware.  Trish Mage [phone] ED CSW 04/26/2015 3:40 PM
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
## 2015-04-26 NOTE — ED Notes (Signed)
Warm packs for chronic leg pain, noted patient has varicose veins on right leg

## 2015-04-29 ENCOUNTER — Other Ambulatory Visit: Payer: Medicare Other

## 2015-04-29 ENCOUNTER — Ambulatory Visit (HOSPITAL_BASED_OUTPATIENT_CLINIC_OR_DEPARTMENT_OTHER): Payer: Medicare Other | Admitting: Pharmacist

## 2015-04-29 DIAGNOSIS — D688 Other specified coagulation defects: Secondary | ICD-10-CM

## 2015-04-29 LAB — POCT INR: INR: 2.91

## 2015-04-29 NOTE — Progress Notes (Signed)
INR at goal in ED visit 2.91 (goal 2-3) *No Charge - non-face-to-face encounter* Dr. Cyndie Chime patient Pt seen in ED for psychiatric issues Pt will be admitted to [facility] hospital facility for psychiatric care Coumadin likely to be managed there or through correspondence with Dr. Patsy Lager Office Plan: continue taking  (1 tablet) every day.  Follow up INR in 4 weeks

## 2015-04-29 NOTE — Patient Instructions (Signed)
INR at goal in ED continue taking  (1 tablet) every day.  Recheck INR in 4 weeks

## 2015-05-10 ENCOUNTER — Other Ambulatory Visit: Payer: Self-pay | Admitting: Family Medicine

## 2015-05-11 ENCOUNTER — Encounter (HOSPITAL_COMMUNITY): Payer: Self-pay | Admitting: *Deleted

## 2015-05-11 ENCOUNTER — Emergency Department (HOSPITAL_COMMUNITY)
Admission: EM | Admit: 2015-05-11 | Discharge: 2015-05-11 | Disposition: A | Discharge status: Home / Self Care | Payer: Medicare Other | Attending: Emergency Medicine | Admitting: Emergency Medicine

## 2015-05-11 DIAGNOSIS — Z86711 Personal history of pulmonary embolism: Secondary | ICD-10-CM | POA: Diagnosis not present

## 2015-05-11 DIAGNOSIS — Z862 Personal history of diseases of the blood and blood-forming organs and certain disorders involving the immune mechanism: Secondary | ICD-10-CM | POA: Diagnosis not present

## 2015-05-11 DIAGNOSIS — Z8739 Personal history of other diseases of the musculoskeletal system and connective tissue: Secondary | ICD-10-CM | POA: Diagnosis not present

## 2015-05-11 DIAGNOSIS — Z79899 Other long term (current) drug therapy: Secondary | ICD-10-CM | POA: Diagnosis not present

## 2015-05-11 DIAGNOSIS — Z7901 Long term (current) use of anticoagulants: Secondary | ICD-10-CM | POA: Insufficient documentation

## 2015-05-11 DIAGNOSIS — Z7951 Long term (current) use of inhaled steroids: Secondary | ICD-10-CM | POA: Diagnosis not present

## 2015-05-11 DIAGNOSIS — Z8701 Personal history of pneumonia (recurrent): Secondary | ICD-10-CM | POA: Insufficient documentation

## 2015-05-11 DIAGNOSIS — F419 Anxiety disorder, unspecified: Secondary | ICD-10-CM | POA: Insufficient documentation

## 2015-05-11 DIAGNOSIS — K219 Gastro-esophageal reflux disease without esophagitis: Secondary | ICD-10-CM | POA: Insufficient documentation

## 2015-05-11 DIAGNOSIS — J45909 Unspecified asthma, uncomplicated: Secondary | ICD-10-CM | POA: Diagnosis not present

## 2015-05-11 DIAGNOSIS — Z9981 Dependence on supplemental oxygen: Secondary | ICD-10-CM | POA: Diagnosis not present

## 2015-05-11 DIAGNOSIS — G8929 Other chronic pain: Secondary | ICD-10-CM | POA: Diagnosis not present

## 2015-05-11 DIAGNOSIS — R011 Cardiac murmur, unspecified: Secondary | ICD-10-CM | POA: Diagnosis not present

## 2015-05-11 DIAGNOSIS — L409 Psoriasis, unspecified: Secondary | ICD-10-CM

## 2015-05-11 DIAGNOSIS — Z86718 Personal history of other venous thrombosis and embolism: Secondary | ICD-10-CM | POA: Insufficient documentation

## 2015-05-11 DIAGNOSIS — I10 Essential (primary) hypertension: Secondary | ICD-10-CM | POA: Insufficient documentation

## 2015-05-11 DIAGNOSIS — L299 Pruritus, unspecified: Secondary | ICD-10-CM | POA: Insufficient documentation

## 2015-05-11 DIAGNOSIS — F329 Major depressive disorder, single episode, unspecified: Secondary | ICD-10-CM | POA: Diagnosis not present

## 2015-05-11 DIAGNOSIS — G4730 Sleep apnea, unspecified: Secondary | ICD-10-CM | POA: Insufficient documentation

## 2015-05-11 DIAGNOSIS — G894 Chronic pain syndrome: Secondary | ICD-10-CM

## 2015-05-11 DIAGNOSIS — F32A Depression, unspecified: Secondary | ICD-10-CM

## 2015-05-11 MED ORDER — HYDROXYZINE HCL 10 MG PO TABS
10.0000 mg | ORAL_TABLET | Freq: Once | ORAL | Status: AC
  Administered 2015-05-11: 10 mg via ORAL
  Filled 2015-05-11: qty 1

## 2015-05-11 MED ORDER — HYDROXYZINE HCL 10 MG PO TABS: 10.0000 mg | ORAL_TABLET | Freq: Four times a day (QID) | ORAL | Status: DC | PRN

## 2015-05-11 NOTE — ED Provider Notes (Signed)
CSN: [id number]   Arrival date & time 05/11/15 0108  History  This chart was scribed for Paula Libra, MD by Bethel Born, ED Scribe. This patient was seen in room WA05/WA05 and the patient's care was started at 3:38 AM.  Chief Complaint  Patient presents with  . Itching     HPI The history is provided by the patient. No language interpreter was used.   [name] [name] is a 74 y.o. female with PMHx of psoriasis who presents to the Emergency Department complaining of diffuse itching with onset 1 month ago after using a cream in the hospital. Pt states that she was prescribed a cream for a rash that usually helps but had to use something different while admitted to the hospital and since that time she has been itching.  Pt denies SOB, nausea and vomiting.   Past Medical History  Diagnosis Date  . Depression   . Schizophrenia   . Hyperlipidemia   . Hypertension   . Chronic pain     "over my whole body" (03/30/2013)  . 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 DVT (deep venous thrombosis)     per 01/17/10 d/c summary- "remote hx of dvt"?  . Iron deficiency anemia   . Glaucoma   . Hemorrhoids   . Asthma   . Anxiety   . GERD (gastroesophageal reflux disease)   . Psoriasis 04/29/2012    New onset evaluated by Dr Marylou Flesher, Dermatology, [facility] 6/13  Rx 0.1% Tacrolimus ointment  . Complication of anesthesia     "because I have sleep apnea" (03/30/2013)  . Heart murmur   . Chest pain, exertional   . Chronic bronchitis   . Sleep apnea     "waiting on my CPAP" (03/30/2013)  . Pneumonia     "a few times" (03/30/2013)  . Exertional shortness of breath     "& sometimes when laying down" (03/30/2013)  . Daily headache     "last 2 months" (03/30/2013)  . Arthritis     "joints" (03/30/2013)  . Varicose veins of legs   . Psoriasis     Past Surgical History  Procedure Laterality Date  . Spinal fusion      2004  . Carpal tunnel release Bilateral   . Tubal ligation   1973  . Dilation and curettage of uterus  1970's    "once" (03/30/2013)  . 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 Joint replacement    . Vein surgery  left leg    Family History  Problem Relation Age of Onset  . Diabetes Sister   . Colon cancer Brother 40  . Colon cancer Brother     Social History  Substance Use Topics  . Smoking status: Never Smoker   . Smokeless tobacco: Never Used  . Alcohol Use: No     Review of Systems 10 Systems reviewed and all are negative for acute change except as noted in the HPI.  Home Medications   Prior to Admission medications   Medication Sig Start Date End Date Taking? Authorizing Provider  albuterol (PROVENTIL HFA;VENTOLIN HFA) 108 (90 BASE) MCG/ACT inhaler Inhale 2 puffs into the lungs every 6 (six) hours as needed for wheezing or shortness of breath (wheezing). 12/08/14   Tyrone Nine, MD  amLODipine (NORVASC) 10 MG tablet Take 10 mg by mouth daily. 04/18/15   Historical Provider, MD  atenolol (TENORMIN) 100 MG tablet TAKE 1 TABLET BY MOUTH EVERY  DAY Patient taking differently: TAKE 100 MG BY MOUTH DAILY. 02/20/15   Tyrone Nine, MD  beclomethasone (QVAR) 80 MCG/ACT inhaler Inhale 2 puffs into the lungs 2 (two) times daily as needed (wheezing).     Historical Provider, MD  esomeprazole (NEXIUM) 40 MG capsule Take 1 capsule (40 mg total) by mouth daily. 02/01/15   Arby Barrette, MD  FLOVENT DISKUS 250 MCG/BLIST AEPB Inhale 2 puffs into the lungs 2 (two) times daily. 04/20/15   Historical Provider, MD  FLUoxetine (PROZAC) 20 MG capsule Take 3 capsules (60 mg total) by mouth daily. 05/26/14   Tyrone Nine, MD  furosemide (LASIX) 20 MG tablet Take 1 tablet (20 mg total) by mouth daily. 10/29/14   Tyrone Nine, MD  gabapentin (NEURONTIN) 400 MG capsule Take 1 capsule (400 mg total) by mouth 3 (three) times daily. 12/08/14   Tyrone Nine, MD  hydrOXYzine (ATARAX/VISTARIL) 10 MG tablet Take 1 tablet (10 mg total) by mouth every 6 (six) hours as needed for  itching (itching). 05/11/15   Jillian Pianka, MD  lisinopril (PRINIVIL,ZESTRIL) 20 MG tablet Take 1 tablet (20 mg total) by mouth daily. 12/08/14   Tyrone Nine, MD  meclizine (ANTIVERT) 25 MG tablet Take 1 tablet (25 mg total) by mouth 3 (three) times daily as needed for dizziness. 02/01/15   Arby Barrette, MD  silver sulfADIAZINE (SILVADENE) 1 % cream APPLY TOPICALLY EVERY DAY Patient taking differently: Apply Topically to Legs and body as needed for itching/rash. Alternates daily. 12/10/14   Tyrone Nine, MD  traMADol (ULTRAM) 50 MG tablet Take 1 tablet (50 mg total) by mouth every 6 (six) hours as needed. Patient taking differently: Take 50 mg by mouth every 6 (six) hours as needed for moderate pain or severe pain.  01/26/15   Harle Battiest, NP  triamcinolone cream (KENALOG) 0.1 % Apply 1 application topically 2 (two) times daily as needed (for legs). 10/29/14   Tyrone Nine, MD  warfarin (COUMADIN) 5 MG tablet TAKE 1 TABLET BY MOUTH EVERY DAY Patient taking differently: TAKE 5 MG BY MOUTH DAILY. 10/18/14   Levert Feinstein, MD    Allergies  Review of patient's allergies indicates no known allergies.  Triage Vitals: BP 138/86 mmHg  Pulse 61  Temp(Src) 98.2 F (36.8 C) (Oral)  Resp 22  SpO2 96%  LMP 11/22/1996  Physical Exam General: Well-developed, well-nourished female in no acute distress; appearance consistent with age of record HENT: normocephalic; atraumatic Eyes: pupils equal, round and reactive to light; extraocular muscles intact, arcus senilis bilaterally Neck: supple Heart: regular rate and rhythm Lungs: clear to auscultation bilaterally Abdomen: soft; nondistended; nontender; bowel sounds present Extremities: No deformity; full range of motion; pulses normal Neurologic: Awake, alert and oriented; motor function intact in all extremities and symmetric; no facial droop Skin: Warm and dry; chronic appearing hyperpigmentation of left lower leg; no rash seen Psychiatric:  Normal mood and affect  ED Course  Procedures   DIAGNOSTIC STUDIES: Oxygen Saturation is 96% on RA, normal by my interpretation.    COORDINATION OF CARE: 3:45 AM Discussed treatment plan which includes Atarax with pt at bedside and pt agreed to plan.    MDM   Final diagnoses:  Itching   I personally performed the services described in this documentation, which was scribed in my presence. The recorded information has been reviewed and is accurate.    Paula Libra, MD 05/11/15 [phone]

## 2015-05-11 NOTE — ED Notes (Signed)
Bed: WA05 Expected date:  Expected time:  Means of arrival:  Comments: 

## 2015-05-11 NOTE — ED Notes (Signed)
Per EMS, pt from home, reports generalized itching x 4 days.  Pt states she has had itching all over her body other than her face, butt and vagina.  Pt ambulatory without diffculty

## 2015-06-08 ENCOUNTER — Telehealth: Payer: Self-pay | Admitting: *Deleted

## 2015-06-08 ENCOUNTER — Telehealth: Payer: Self-pay | Admitting: Pharmacist

## 2015-06-08 NOTE — Telephone Encounter (Signed)
The coumadin clinic called regarding this pt.  They state the pt has Chronic or is receiving chronic coumadin and the office is closing where she is receiving care.  They will like to speak with Dr. Katrinka Blazing when possible. They may be reached at [phone].

## 2015-06-08 NOTE — Telephone Encounter (Signed)
Left message at [facility] for Dr. Nilda Simmer. [name] [name] has an appt with her on 06/13/15. Will she be managing anticoagulation?

## 2015-06-12 ENCOUNTER — Emergency Department (HOSPITAL_COMMUNITY): Payer: Medicare Other

## 2015-06-12 ENCOUNTER — Encounter (HOSPITAL_COMMUNITY): Payer: Self-pay | Admitting: Emergency Medicine

## 2015-06-12 ENCOUNTER — Emergency Department (HOSPITAL_COMMUNITY)
Admission: EM | Admit: 2015-06-12 | Discharge: 2015-06-12 | Disposition: A | Discharge status: Home / Self Care | Payer: Medicare Other | Attending: Emergency Medicine | Admitting: Emergency Medicine

## 2015-06-12 DIAGNOSIS — Z9981 Dependence on supplemental oxygen: Secondary | ICD-10-CM | POA: Diagnosis not present

## 2015-06-12 DIAGNOSIS — R011 Cardiac murmur, unspecified: Secondary | ICD-10-CM | POA: Insufficient documentation

## 2015-06-12 DIAGNOSIS — S0990XA Unspecified injury of head, initial encounter: Secondary | ICD-10-CM | POA: Insufficient documentation

## 2015-06-12 DIAGNOSIS — Z79899 Other long term (current) drug therapy: Secondary | ICD-10-CM | POA: Diagnosis not present

## 2015-06-12 DIAGNOSIS — G8929 Other chronic pain: Secondary | ICD-10-CM | POA: Insufficient documentation

## 2015-06-12 DIAGNOSIS — Z86711 Personal history of pulmonary embolism: Secondary | ICD-10-CM | POA: Diagnosis not present

## 2015-06-12 DIAGNOSIS — W01198A Fall on same level from slipping, tripping and stumbling with subsequent striking against other object, initial encounter: Secondary | ICD-10-CM | POA: Diagnosis not present

## 2015-06-12 DIAGNOSIS — W19XXXA Unspecified fall, initial encounter: Secondary | ICD-10-CM

## 2015-06-12 DIAGNOSIS — Y998 Other external cause status: Secondary | ICD-10-CM | POA: Insufficient documentation

## 2015-06-12 DIAGNOSIS — Z7951 Long term (current) use of inhaled steroids: Secondary | ICD-10-CM | POA: Diagnosis not present

## 2015-06-12 DIAGNOSIS — F329 Major depressive disorder, single episode, unspecified: Secondary | ICD-10-CM | POA: Insufficient documentation

## 2015-06-12 DIAGNOSIS — Y92009 Unspecified place in unspecified non-institutional (private) residence as the place of occurrence of the external cause: Secondary | ICD-10-CM | POA: Diagnosis not present

## 2015-06-12 DIAGNOSIS — I10 Essential (primary) hypertension: Secondary | ICD-10-CM | POA: Diagnosis not present

## 2015-06-12 DIAGNOSIS — Z7952 Long term (current) use of systemic steroids: Secondary | ICD-10-CM | POA: Insufficient documentation

## 2015-06-12 DIAGNOSIS — Y9389 Activity, other specified: Secondary | ICD-10-CM | POA: Diagnosis not present

## 2015-06-12 DIAGNOSIS — Z8701 Personal history of pneumonia (recurrent): Secondary | ICD-10-CM | POA: Insufficient documentation

## 2015-06-12 DIAGNOSIS — S3992XA Unspecified injury of lower back, initial encounter: Secondary | ICD-10-CM | POA: Diagnosis not present

## 2015-06-12 DIAGNOSIS — R519 Headache, unspecified: Secondary | ICD-10-CM

## 2015-06-12 DIAGNOSIS — K219 Gastro-esophageal reflux disease without esophagitis: Secondary | ICD-10-CM | POA: Insufficient documentation

## 2015-06-12 DIAGNOSIS — Z7901 Long term (current) use of anticoagulants: Secondary | ICD-10-CM | POA: Diagnosis not present

## 2015-06-12 DIAGNOSIS — R51 Headache: Secondary | ICD-10-CM

## 2015-06-12 DIAGNOSIS — Z86718 Personal history of other venous thrombosis and embolism: Secondary | ICD-10-CM | POA: Insufficient documentation

## 2015-06-12 DIAGNOSIS — G4730 Sleep apnea, unspecified: Secondary | ICD-10-CM | POA: Insufficient documentation

## 2015-06-12 DIAGNOSIS — F209 Schizophrenia, unspecified: Secondary | ICD-10-CM | POA: Insufficient documentation

## 2015-06-12 DIAGNOSIS — M1990 Unspecified osteoarthritis, unspecified site: Secondary | ICD-10-CM | POA: Diagnosis not present

## 2015-06-12 MED ORDER — TRAMADOL HCL 50 MG PO TABS
50.0000 mg | ORAL_TABLET | Freq: Once | ORAL | Status: AC
  Administered 2015-06-12: 50 mg via ORAL
  Filled 2015-06-12: qty 1

## 2015-06-12 MED ORDER — TRAMADOL HCL 50 MG PO TABS: 50.0000 mg | ORAL_TABLET | Freq: Four times a day (QID) | ORAL | Status: DC | PRN

## 2015-06-12 MED ORDER — ACETAMINOPHEN 325 MG PO TABS
650.0000 mg | ORAL_TABLET | Freq: Once | ORAL | Status: DC
  Filled 2015-06-12: qty 2

## 2015-06-12 NOTE — ED Notes (Signed)
[facility] here to transport pt back to her residence. 

## 2015-06-12 NOTE — ED Notes (Signed)
Brought in by EMS from home with c/o generalized body pain.  Pt reported that she has had a fall last Monday with no apparent injury; today, she started having headache, back and bilateral leg pain.

## 2015-06-12 NOTE — ED Notes (Signed)
Bed: WA03 Expected date:  Expected time:  Means of arrival:  Comments: Ems-fall 

## 2015-06-12 NOTE — ED Provider Notes (Signed)
CSN: [id number]     Arrival date & time 06/12/15  0029 History   First MD Initiated Contact with Patient 06/12/15 0046     Chief Complaint  Patient presents with  . Generalized Pain      (Consider location/radiation/quality/duration/timing/severity/associated sxs/prior Treatment) HPI Comments: Patient is a 74 year old female who presents after a fall that occurred 5 days ago. Patient reports having a history of vertigo which is chronic and causes her to have occasional falls. She had brief episode of position vertigo which caused her to fall backwards, hitting her head. She denies LOC. Since the fall, she reports aching head pain and neck pain without radiation. No aggravating/alleviating factors. No other associated symptoms. No other injuries.    Past Medical History  Diagnosis Date  . Depression   . Schizophrenia   . Hyperlipidemia   . Hypertension   . Chronic pain     "over my whole body" (03/30/2013)  . 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 DVT (deep venous thrombosis)     per 01/17/10 d/c summary- "remote hx of dvt"?  . Iron deficiency anemia   . Glaucoma   . Hemorrhoids   . Asthma   . Anxiety   . GERD (gastroesophageal reflux disease)   . Psoriasis 04/29/2012    New onset evaluated by Dr Marylou Flesher, Dermatology, [facility] 6/13  Rx 0.1% Tacrolimus ointment  . Complication of anesthesia     "because I have sleep apnea" (03/30/2013)  . Heart murmur   . Chest pain, exertional   . Chronic bronchitis   . Sleep apnea     "waiting on my CPAP" (03/30/2013)  . Pneumonia     "a few times" (03/30/2013)  . Exertional shortness of breath     "& sometimes when laying down" (03/30/2013)  . Daily headache     "last 2 months" (03/30/2013)  . Arthritis     "joints" (03/30/2013)  . Varicose veins of legs   . Psoriasis    Past Surgical History  Procedure Laterality Date  . Spinal fusion      2004  . Carpal tunnel release Bilateral   . Tubal ligation  1973  .  Dilation and curettage of uterus  1970's    "once" (03/30/2013)  . 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 Joint replacement    . Vein surgery  left leg   Family History  Problem Relation Age of Onset  . Diabetes Sister   . Colon cancer Brother 40  . Colon cancer Brother    Social History  Substance Use Topics  . Smoking status: Never Smoker   . Smokeless tobacco: Never Used  . Alcohol Use: No   OB History    No data available     Review of Systems  Musculoskeletal: Positive for back pain.  Neurological: Positive for headaches.  All other systems reviewed and are negative.     Allergies  Acetaminophen-codeine and Methadone  Home Medications   Prior to Admission medications   Medication Sig Start Date End Date Taking? Authorizing Provider  albuterol (PROVENTIL HFA;VENTOLIN HFA) 108 (90 BASE) MCG/ACT inhaler Inhale 2 puffs into the lungs every 6 (six) hours as needed for wheezing or shortness of breath (wheezing). 12/08/14   Tyrone Nine, MD  amLODipine (NORVASC) 10 MG tablet Take 10 mg by mouth daily. 04/18/15   Historical Provider, MD  amLODipine (NORVASC) 5 MG tablet Take 5 mg by mouth daily.  05/05/15 05/04/16  Historical Provider, MD  atenolol (TENORMIN) 100 MG tablet TAKE 1 TABLET BY MOUTH EVERY DAY Patient taking differently: TAKE 100 MG BY MOUTH DAILY. 02/20/15   Tyrone Nine, MD  atenolol (TENORMIN) 25 MG tablet Take 25 mg by mouth daily. 05/05/15 05/04/16  Historical Provider, MD  atorvastatin (LIPITOR) 80 MG tablet Take 80 mg by mouth daily. 05/05/15   Historical Provider, MD  beclomethasone (QVAR) 80 MCG/ACT inhaler Inhale 2 puffs into the lungs 2 (two) times daily as needed (wheezing).     Historical Provider, MD  diclofenac sodium (VOLTAREN) 1 % GEL Place 2 g onto the skin 4 (four) times daily as needed. Joint pain 05/05/15   Historical Provider, MD  esomeprazole (NEXIUM) 40 MG capsule Take 1 capsule (40 mg total) by mouth daily. 02/01/15   Arby Barrette, MD   esomeprazole (NEXIUM) 40 MG capsule Take 40 mg by mouth daily. 05/05/15   Historical Provider, MD  FLOVENT DISKUS 250 MCG/BLIST AEPB Inhale 2 puffs into the lungs 2 (two) times daily. 04/20/15   Historical Provider, MD  FLUoxetine (PROZAC) 20 MG capsule Take 3 capsules (60 mg total) by mouth daily. 05/26/14   Tyrone Nine, MD  fluticasone (FLOVENT HFA) 220 MCG/ACT inhaler Inhale 2 puffs into the lungs 2 (two) times daily. 05/05/15   Historical Provider, MD  furosemide (LASIX) 20 MG tablet Take 1 tablet (20 mg total) by mouth daily. Patient taking differently: Take 20 mg by mouth daily as needed for fluid (only when legs are swollen).  10/29/14   Tyrone Nine, MD  gabapentin (NEURONTIN) 400 MG capsule Take 1 capsule (400 mg total) by mouth 3 (three) times daily. 12/08/14   Tyrone Nine, MD  hydrocortisone 2.5 % cream Apply 1 application topically daily. 05/05/15 05/04/16  Historical Provider, MD  hydrOXYzine (ATARAX/VISTARIL) 10 MG tablet Take 1 tablet (10 mg total) by mouth every 6 (six) hours as needed for itching (itching). 05/11/15   John Molpus, MD  lisinopril (PRINIVIL,ZESTRIL) 20 MG tablet Take 1 tablet (20 mg total) by mouth daily. 12/08/14   Tyrone Nine, MD  meclizine (ANTIVERT) 25 MG tablet Take 1 tablet (25 mg total) by mouth 3 (three) times daily as needed for dizziness. 02/01/15   Arby Barrette, MD  memantine (NAMENDA XR) 7 MG CP24 24 hr capsule Take 7 mg by mouth daily. 05/05/15   Historical Provider, MD  metoCLOPramide (REGLAN) 10 MG tablet Take 10 mg by mouth every 6 (six) hours as needed for nausea.    Historical Provider, MD  mometasone [facility]) 220 MCG/INH inhaler Inhale 2 puffs into the lungs 2 (two) times daily. 05/05/15 05/04/16  Historical Provider, MD  silver sulfADIAZINE (SILVADENE) 1 % cream APPLY TOPICALLY EVERY DAY Patient taking differently: Apply Topically to Legs and body as needed for itching/rash. Alternates daily. 12/10/14   Tyrone Nine, MD  traMADol (ULTRAM) 50 MG tablet Take 1  tablet (50 mg total) by mouth every 6 (six) hours as needed. Patient taking differently: Take 50 mg by mouth every 6 (six) hours as needed for moderate pain or severe pain.  01/26/15   Harle Battiest, NP  triamcinolone cream (KENALOG) 0.1 % Apply 1 application topically 2 (two) times daily as needed (for legs). 10/29/14   Tyrone Nine, MD  warfarin (COUMADIN) 5 MG tablet TAKE 1 TABLET BY MOUTH EVERY DAY Patient taking differently: TAKE 5 MG BY MOUTH DAILY. 10/18/14   Levert Feinstein, MD  warfarin (COUMADIN) 6  MG tablet Take 6 mg by mouth daily. 05/05/15 05/04/16  Historical Provider, MD   BP 103/62 mmHg  Pulse 67  Temp(Src) 98.3 F (36.8 C) (Oral)  Resp 18  SpO2 95%  LMP 11/22/1996 Physical Exam  Constitutional: She is oriented to person, place, and time. She appears well-developed and well-nourished. No distress.  HENT:  Head: Normocephalic and atraumatic.  Eyes: Conjunctivae and EOM are normal.  Neck: Normal range of motion.  Cardiovascular: Normal rate and regular rhythm.  Exam reveals no gallop and no friction rub.   No murmur heard. Pulmonary/Chest: Effort normal and breath sounds normal. She has no wheezes. She has no rales. She exhibits no tenderness.  Abdominal: Soft. She exhibits no distension. There is no tenderness. There is no rebound.  Musculoskeletal: Normal range of motion.  No midline spine tenderness to palpation. Full ROM of joints.   Neurological: She is alert and oriented to person, place, and time. No cranial nerve deficit. Coordination normal.  Speech is goal-oriented. Moves limbs without ataxia.   Skin: Skin is warm and dry.  Psychiatric: She has a normal mood and affect. Her behavior is normal.  Nursing note and vitals reviewed.   ED Course  Procedures (including critical care time) Labs Review Labs Reviewed - No data to display  Imaging Review Dg Lumbar Spine Complete  06/12/2015   CLINICAL DATA:  Fall 5 days prior.  Now with back pain.  EXAM: LUMBAR  SPINE - COMPLETE 4+ VIEW  COMPARISON:  04/08/2015  FINDINGS: Posterior fusion L4-L5 with interbody spacer. The hardware is intact. Vertebral body heights are normal. There is no listhesis. The posterior elements are intact. There is disc space narrowing at L3-L4 and L5-S1 with endplate spurring. No fracture. Sacroiliac joints are symmetric.  IMPRESSION: No acute fracture or subluxation. Intact spinal fusion hardware, stable degenerative change.   Electronically Signed   By: [name] M.D.   On: 06/12/2015 02:15   Ct Head Wo Contrast  06/12/2015   CLINICAL DATA:  74 year old female with headache after fall  EXAM: CT HEAD WITHOUT CONTRAST  CT CERVICAL SPINE WITHOUT CONTRAST  TECHNIQUE: Multidetector CT imaging of the head and cervical spine was performed following the standard protocol without intravenous contrast. Multiplanar CT image reconstructions of the cervical spine were also generated.  COMPARISON:  CT dated 03/25/2015  FINDINGS: CT HEAD FINDINGS  There is slight prominence of the ventricles and sulci compatible with age-related volume loss. Mild periventricular and deep white matter hypodensities represent chronic microvascular ischemic changes. There is no intracranial hemorrhage. No mass effect or midline shift identified.  The visualized paranasal sinuses and mastoid air cells are well aerated. The calvarium is intact.  CT CERVICAL SPINE FINDINGS  There is no acute fracture or subluxation of the cervical spine.Multilevel degenerative changes.The odontoid and spinous processes are intact.There is normal anatomic alignment of the C1-C2 lateral masses. The visualized soft tissues appear unremarkable.  IMPRESSION: No acute intracranial pathology.  Mild age-related atrophy and chronic microvascular ischemic disease.  No acute/traumatic cervical spine pathology.   Electronically Signed   By: Elgie Collard M.D.   On: 06/12/2015 02:14   Ct Cervical Spine Wo Contrast  06/12/2015   CLINICAL DATA:   74 year old female with headache after fall  EXAM: CT HEAD WITHOUT CONTRAST  CT CERVICAL SPINE WITHOUT CONTRAST  TECHNIQUE: Multidetector CT imaging of the head and cervical spine was performed following the standard protocol without intravenous contrast. Multiplanar CT image reconstructions of the cervical spine were also  generated.  COMPARISON:  CT dated 03/25/2015  FINDINGS: CT HEAD FINDINGS  There is slight prominence of the ventricles and sulci compatible with age-related volume loss. Mild periventricular and deep white matter hypodensities represent chronic microvascular ischemic changes. There is no intracranial hemorrhage. No mass effect or midline shift identified.  The visualized paranasal sinuses and mastoid air cells are well aerated. The calvarium is intact.  CT CERVICAL SPINE FINDINGS  There is no acute fracture or subluxation of the cervical spine.Multilevel degenerative changes.The odontoid and spinous processes are intact.There is normal anatomic alignment of the C1-C2 lateral masses. The visualized soft tissues appear unremarkable.  IMPRESSION: No acute intracranial pathology.  Mild age-related atrophy and chronic microvascular ischemic disease.  No acute/traumatic cervical spine pathology.   Electronically Signed   By: Elgie Collard M.D.   On: 06/12/2015 02:14   I have personally reviewed and evaluated these images and lab results as part of my medical decision-making.   EKG Interpretation None      MDM   Final diagnoses:  Fall, initial encounter  Nonintractable headache, unspecified chronicity pattern, unspecified headache type    1:05 AM CT head and cervical spine pending. Xray of lumbar spine pending. Vitals stable and patient afebrile.   Patient's imaging unremarkable for acute changes. Patient will be discharged with Tramadol for pain and PCP follow up.     Emilia Beck, PA-C 06/12/15 0503  Geoffery Lyons, MD 06/12/15 [phone]

## 2015-06-12 NOTE — Discharge Instructions (Signed)
Take Tramadol as needed for pain. Follow up with your doctor.

## 2015-06-13 ENCOUNTER — Ambulatory Visit: Payer: Self-pay | Admitting: Family Medicine

## 2015-06-13 ENCOUNTER — Telehealth: Payer: Self-pay

## 2015-06-13 NOTE — Telephone Encounter (Signed)
Tried to call patient to see why they did not come to their 3:15 Appt with Dr. Katrinka Blazing and her number was disconnected.

## 2015-06-16 NOTE — Telephone Encounter (Addendum)
Spoke with oncology coumadin clinic pharmacist; advised that patient NO SHOW for visit last week.  Previous PCP [facility].  Per pharmacist, pt needs to transition Coumadin management to a different facility; pt previously followed by [facility].  Advised I will be happy to facilitate care with patient if she chooses to establish at [facility] in the future.

## 2015-06-18 ENCOUNTER — Emergency Department (EMERGENCY_DEPARTMENT_HOSPITAL)
Admit: 2015-06-18 | Discharge: 2015-06-18 | Disposition: A | Discharge status: Home / Self Care | Payer: Medicare Other | Attending: Emergency Medicine | Admitting: Emergency Medicine

## 2015-06-18 ENCOUNTER — Encounter (HOSPITAL_COMMUNITY): Payer: Self-pay | Admitting: *Deleted

## 2015-06-18 ENCOUNTER — Emergency Department (HOSPITAL_COMMUNITY)
Admission: EM | Admit: 2015-06-18 | Discharge: 2015-06-18 | Disposition: A | Discharge status: Home / Self Care | Payer: Medicare Other | Attending: Emergency Medicine | Admitting: Emergency Medicine

## 2015-06-18 DIAGNOSIS — G8929 Other chronic pain: Secondary | ICD-10-CM | POA: Diagnosis not present

## 2015-06-18 DIAGNOSIS — Z86711 Personal history of pulmonary embolism: Secondary | ICD-10-CM | POA: Insufficient documentation

## 2015-06-18 DIAGNOSIS — J45909 Unspecified asthma, uncomplicated: Secondary | ICD-10-CM | POA: Insufficient documentation

## 2015-06-18 DIAGNOSIS — Z792 Long term (current) use of antibiotics: Secondary | ICD-10-CM | POA: Insufficient documentation

## 2015-06-18 DIAGNOSIS — E669 Obesity, unspecified: Secondary | ICD-10-CM | POA: Diagnosis not present

## 2015-06-18 DIAGNOSIS — Z862 Personal history of diseases of the blood and blood-forming organs and certain disorders involving the immune mechanism: Secondary | ICD-10-CM | POA: Diagnosis not present

## 2015-06-18 DIAGNOSIS — Z7951 Long term (current) use of inhaled steroids: Secondary | ICD-10-CM | POA: Insufficient documentation

## 2015-06-18 DIAGNOSIS — K219 Gastro-esophageal reflux disease without esophagitis: Secondary | ICD-10-CM | POA: Insufficient documentation

## 2015-06-18 DIAGNOSIS — M7989 Other specified soft tissue disorders: Secondary | ICD-10-CM | POA: Diagnosis not present

## 2015-06-18 DIAGNOSIS — I10 Essential (primary) hypertension: Secondary | ICD-10-CM | POA: Diagnosis not present

## 2015-06-18 DIAGNOSIS — Z872 Personal history of diseases of the skin and subcutaneous tissue: Secondary | ICD-10-CM | POA: Diagnosis not present

## 2015-06-18 DIAGNOSIS — M79605 Pain in left leg: Secondary | ICD-10-CM | POA: Insufficient documentation

## 2015-06-18 DIAGNOSIS — M1990 Unspecified osteoarthritis, unspecified site: Secondary | ICD-10-CM | POA: Insufficient documentation

## 2015-06-18 DIAGNOSIS — F419 Anxiety disorder, unspecified: Secondary | ICD-10-CM | POA: Diagnosis not present

## 2015-06-18 DIAGNOSIS — E785 Hyperlipidemia, unspecified: Secondary | ICD-10-CM | POA: Diagnosis not present

## 2015-06-18 DIAGNOSIS — I8390 Asymptomatic varicose veins of unspecified lower extremity: Secondary | ICD-10-CM

## 2015-06-18 DIAGNOSIS — F329 Major depressive disorder, single episode, unspecified: Secondary | ICD-10-CM | POA: Diagnosis not present

## 2015-06-18 DIAGNOSIS — R011 Cardiac murmur, unspecified: Secondary | ICD-10-CM | POA: Diagnosis not present

## 2015-06-18 DIAGNOSIS — Z86718 Personal history of other venous thrombosis and embolism: Secondary | ICD-10-CM | POA: Insufficient documentation

## 2015-06-18 DIAGNOSIS — I8312 Varicose veins of left lower extremity with inflammation: Secondary | ICD-10-CM | POA: Diagnosis not present

## 2015-06-18 DIAGNOSIS — Z79899 Other long term (current) drug therapy: Secondary | ICD-10-CM | POA: Insufficient documentation

## 2015-06-18 DIAGNOSIS — Z8701 Personal history of pneumonia (recurrent): Secondary | ICD-10-CM | POA: Diagnosis not present

## 2015-06-18 DIAGNOSIS — G4733 Obstructive sleep apnea (adult) (pediatric): Secondary | ICD-10-CM | POA: Insufficient documentation

## 2015-06-18 LAB — CBC WITH DIFFERENTIAL/PLATELET
BASOS ABS: 0 10*3/uL (ref 0.0–0.1)
Basophils Relative: 0 %
Eosinophils Absolute: 0.1 10*3/uL (ref 0.0–0.7)
Eosinophils Relative: 2 %
HCT: 33.3 % — ABNORMAL LOW (ref 36.0–46.0)
HEMOGLOBIN: 11 g/dL — AB (ref 12.0–15.0)
LYMPHS ABS: 1.3 10*3/uL (ref 0.7–4.0)
LYMPHS PCT: 21 %
MCH: 31 pg (ref 26.0–34.0)
MCHC: 33 g/dL (ref 30.0–36.0)
MCV: 93.8 fL (ref 78.0–100.0)
Monocytes Absolute: 0.3 10*3/uL (ref 0.1–1.0)
Monocytes Relative: 5 %
NEUTROS ABS: 4.6 10*3/uL (ref 1.7–7.7)
NEUTROS PCT: 72 %
Platelets: 253 10*3/uL (ref 150–400)
RBC: 3.55 MIL/uL — AB (ref 3.87–5.11)
RDW: 14.9 % (ref 11.5–15.5)
WBC: 6.4 10*3/uL (ref 4.0–10.5)

## 2015-06-18 LAB — BASIC METABOLIC PANEL
ANION GAP: 7 (ref 5–15)
BUN: 13 mg/dL (ref 6–20)
CHLORIDE: 103 mmol/L (ref 101–111)
CO2: 28 mmol/L (ref 22–32)
Calcium: 8.9 mg/dL (ref 8.9–10.3)
Creatinine, Ser: 1.08 mg/dL — ABNORMAL HIGH (ref 0.44–1.00)
GFR calc Af Amer: 58 mL/min — ABNORMAL LOW (ref >60)
GFR calc non Af Amer: 50 mL/min — ABNORMAL LOW (ref >60)
GLUCOSE: 104 mg/dL — AB (ref 65–99)
POTASSIUM: 4.4 mmol/L (ref 3.5–5.1)
Sodium: 138 mmol/L (ref 135–145)

## 2015-06-18 MED ORDER — GI COCKTAIL ~~LOC~~
30.0000 mL | Freq: Once | ORAL | Status: AC
  Administered 2015-06-18: 30 mL via ORAL
  Filled 2015-06-18: qty 30

## 2015-06-18 MED ORDER — ESOMEPRAZOLE MAGNESIUM 40 MG PO CPDR: 40.0000 mg | DELAYED_RELEASE_CAPSULE | Freq: Every day | ORAL | Status: DC

## 2015-06-18 MED ORDER — MORPHINE SULFATE (PF) 4 MG/ML IV SOLN
4.0000 mg | Freq: Once | INTRAVENOUS | Status: AC
  Administered 2015-06-18: 4 mg via INTRAVENOUS
  Filled 2015-06-18: qty 1

## 2015-06-18 MED ORDER — MORPHINE SULFATE (PF) 4 MG/ML IV SOLN: 4.0000 mg | Freq: Once | INTRAVENOUS | Status: DC

## 2015-06-18 NOTE — ED Notes (Signed)
RN Zella Ball currently starting IV to get labs

## 2015-06-18 NOTE — ED Notes (Signed)
Pt stated she was getting very upset that the doctor was taking so long and she said that the doctor does not "understand old people." Charge nurse went in there to talk with pt. Nurse currently in there starting an IV.

## 2015-06-18 NOTE — Discharge Instructions (Signed)
1. Medications: usual home medications 2. Treatment: rest, drink plenty of fluids, wear compression socks 3. Follow Up: please followup with your primary doctor for discussion of your diagnoses and further evaluation after today's visit; if you do not have a primary care doctor use the resource guide provided to find one; please return to the ER for new or worsening symptoms   Musculoskeletal Pain Musculoskeletal pain is muscle and boney aches and pains. These pains can occur in any part of the body. Your caregiver may treat you without knowing the cause of the pain. They may treat you if blood or urine tests, X-rays, and other tests were normal.  CAUSES There is often not a definite cause or reason for these pains. These pains may be caused by a type of germ (virus). The discomfort may also come from overuse. Overuse includes working out too hard when your body is not fit. Boney aches also come from weather changes. Bone is sensitive to atmospheric pressure changes. HOME CARE INSTRUCTIONS   Ask when your test results will be ready. Make sure you get your test results.  Only take over-the-counter or prescription medicines for pain, discomfort, or fever as directed by your caregiver. If you were given medications for your condition, do not drive, operate machinery or power tools, or sign legal documents for 24 hours. Do not drink alcohol. Do not take sleeping pills or other medications that may interfere with treatment.  Continue all activities unless the activities cause more pain. When the pain lessens, slowly resume normal activities. Gradually increase the intensity and duration of the activities or exercise.  During periods of severe pain, bed rest may be helpful. Lay or sit in any position that is comfortable.  Putting ice on the injured area.  Put ice in a bag.  Place a towel between your skin and the bag.  Leave the ice on for 15 to 20 minutes, 3 to 4 times a day.  Follow up with  your caregiver for continued problems and no reason can be found for the pain. If the pain becomes worse or does not go away, it may be necessary to repeat tests or do additional testing. Your caregiver may need to look further for a possible cause. SEEK IMMEDIATE MEDICAL CARE IF:  You have pain that is getting worse and is not relieved by medications.  You develop chest pain that is associated with shortness or breath, sweating, feeling sick to your stomach (nauseous), or throw up (vomit).  Your pain becomes localized to the abdomen.  You develop any new symptoms that seem different or that concern you. MAKE SURE YOU:   Understand these instructions.  Will watch your condition.  Will get help right away if you are not doing well or get worse. Document Released: 09/10/2005 Document Revised: 12/03/2011 Document Reviewed: 05/15/2013 [facility] Patient Information 2015 [place], [place]. This information is not intended to replace advice given to you by your health care provider. Make sure you discuss any questions you have with your health care provider.    Varicose Veins Varicose veins are veins that have become enlarged and twisted. CAUSES This condition is the result of valves in the veins not working properly. Valves in the veins help return blood from the leg to the heart. If these valves are damaged, blood flows backwards and backs up into the veins in the leg near the skin. This causes the veins to become larger. People who are on their feet a lot, who are  pregnant, or who are overweight are more likely to develop varicose veins. SYMPTOMS   Bulging, twisted-appearing, bluish veins, most commonly found on the legs.  Leg pain or a feeling of heaviness. These symptoms may be worse at the end of the day.  Leg swelling.  Skin color changes. DIAGNOSIS  Varicose veins can usually be diagnosed with an exam of your legs by your caregiver. He or she may recommend an ultrasound of your leg  veins. TREATMENT  Most varicose veins can be treated at home.However, other treatments are available for people who have persistent symptoms or who want to treat the cosmetic appearance of the varicose veins. These include:  Laser treatment of very small varicose veins.  Medicine that is shot (injected) into the vein. This medicine hardens the walls of the vein and closes off the vein. This treatment is called sclerotherapy. Afterwards, you may need to wear clothing or bandages that apply pressure.  Surgery. HOME CARE INSTRUCTIONS   Do not stand or sit in one position for long periods of time. Do not sit with your legs crossed. Rest with your legs raised during the day.  Wear elastic stockings or support hose. Do not wear other tight, encircling garments around the legs, pelvis, or waist.  Walk as much as possible to increase blood flow.  Raise the foot of your bed at night with 2-inch blocks.  If you get a cut in the skin over the vein and the vein bleeds, lie down with your leg raised and press on it with a clean cloth until the bleeding stops. Then place a bandage (dressing) on the cut. See your caregiver if it continues to bleed or needs stitches. SEEK MEDICAL CARE IF:   The skin around your ankle starts to break down.  You have pain, redness, tenderness, or hard swelling developing in your leg over a vein.  You are uncomfortable due to leg pain. Document Released: 06/20/2005 Document Revised: 12/03/2011 Document Reviewed: 11/06/2010 [facility] Patient Information 2015 [place], [place]. This information is not intended to replace advice given to you by your health care provider. Make sure you discuss any questions you have with your health care provider.   Emergency Department Resource Guide 1) Find a Doctor and Pay Out of Pocket Although you won't have to find out who is covered by your insurance plan, it is a good idea to ask around and get recommendations. You will then need to  call the office and see if the doctor you have chosen will accept you as a new patient and what types of options they offer for patients who are self-pay. Some doctors offer discounts or will set up payment plans for their patients who do not have insurance, but you will need to ask so you aren't surprised when you get to your appointment.  2) Contact Your Local Health Department Not all health departments have doctors that can see patients for sick visits, but many do, so it is worth a call to see if yours does. If you don't know where your local health department is, you can check in your phone book. The CDC also has a tool to help you locate your state's health department, and many state websites also have listings of all of their local health departments.  3) Find a Walk-in Clinic If your illness is not likely to be very severe or complicated, you may want to try a walk in clinic. These are popping up all over the country in pharmacies,  drugstores, and shopping centers. They're usually staffed by nurse practitioners or physician assistants that have been trained to treat common illnesses and complaints. They're usually fairly quick and inexpensive. However, if you have serious medical issues or chronic medical problems, these are probably not your best option.  No Primary Care Doctor: - Call Health Connect at  [phone] - they can help you locate a primary care doctor that  accepts your insurance, provides certain services, etc. - Physician Referral Service- [phone]  Chronic Pain Problems: Organization         Address  Phone   Notes  [facility]  [phone] Patients need to be referred by their primary care doctor.   Medication Assistance: Organization         Address  Phone   Notes  [facility] [address] [place] [place], [place] [address] [phone] --Must be a resident of [facility] -- Must have NO insurance  coverage whatsoever (no Medicaid/ Medicare, etc.) -- The pt. MUST have a primary care doctor that directs their care regularly and follows them in the community   [facility]  [phone]   [employer]  [phone]    Agencies that provide inexpensive medical care: Organization         Address  Phone   Notes  [facility]  [phone]   [facility]    [phone]   [facility] [address] [place], [place] [address] [phone]   [facility] [address] [place] [place] [phone]   Planned Parenthood    [phone]   [facility]    [phone]   Community Health and [facility]  [address], [place] Phone:  [phone], Fax:  [phone] Hours of Operation:  9 am - 6 pm, M-F.  Also accepts Medicaid/Medicare and self-pay.  [facility]  [address], [place] Phone: [phone], Fax: [phone]. Hours of Operation:  8:30 am - 5:30 pm, M-F.  Also accepts Medicaid and self-pay.  [facility] [place] [address], [place] Phone: [phone]   [facility] [address] Natasha Bence [place], [place] [phone], Ext. [phone] Mondays & Thursdays: 7-9 AM.  First 15 patients are seen on a first come, first serve basis.    Medicaid-accepting [facility] Providers:  Organization         Address  Phone   Notes  [facility] [address], [place] [phone] Also accepts self-pay patients.  [facility] [address] Laurell Josephs [place], [place]  [phone]   [facility] [address], [place] [phone]   [facility] [address] [place] [phone]   Renaye Rakers [address], [place]   [phone] Only accepts [place] Access [place] patients after they have their  name applied to their card.   Self-Pay (no insurance) in [facility]:  Organization         Address  Phone   Notes  Sickle Cell Patients, [facility] Internal Medicine [address] [place], [place] [phone]   [facility] [address] [place], [place] [phone]   [facility] [place]  [address], [place] (  [phone]   [facility]/Dr. Osei-Bonsu  [address] [place] or [address], [place] [phone] Phone number for both [place] and [place] locations is the same.  [facility]gent [facility] and [facility] [address] [place] [phone]   [facility] [address] [place] or [address] [phone]   [facility] [place] [facility] [address], [place] [phone], phone; [phone], fax Sees patients 1st and 3rd Saturday of every month.  Must not qualify for public or private insurance (i.e. Medicaid, Medicare, [place] Health Choice, Veterans' Benefits)  Household income should be no more than 200% of the poverty level The clinic cannot treat you if you are pregnant or think you are pregnant  Sexually transmitted diseases are not treated at the clinic.    Dental Care: Organization         Address  Phone  Notes  [facility] Department of [facility] [address] [place], [place] [phone] Accepts children up to age 31 who are enrolled in [place] or [place] Health Choice; pregnant women with a Medicaid card; and children who have applied for Medicaid or [place] Health Choice, but were declined, whose parents can pay a reduced fee at time of service.  [facility] Department of [facility]  [address], [place] [phone] Accepts children up to age 29 who are enrolled in [place] or [place] Health Choice; pregnant women with a Medicaid card; and children who have applied for  Medicaid or [place] Health Choice, but were declined, whose parents can pay a reduced fee at time of service.  [facility]  [address] [place], [place] [phone] Patients are seen by appointment only. Walk-ins are not accepted. [facility] will see patients 23 years of age and older. Monday - Tuesday (8am-5pm) Most Wednesdays (8:30-5pm) $30 per visit, cash only  [facility]  [address], [facility] [phone] Patients are seen by appointment only. Walk-ins are not accepted. [facility] will see patients 22 years of age and older. One Wednesday Evening (Monthly: Volunteer Based).  $30 per visit, cash only  [employer]  [phone] for adults; Children under age 64, call Graduate Pediatric Dentistry at [phone]. Children aged 72-14, please call [phone] to request a pediatric application.  Dental services are provided in all areas of dental care including fillings, crowns and bridges, complete and partial dentures, implants, gum treatment, root canals, and extractions. Preventive care is also provided. Treatment is provided to both adults and children. Patients are selected via a lottery and there is often a waiting list.   [facility] [address] [place]  [phone] [email or web address]   [facility] [address] [place], [place] [phone], Ext. [phone] Second and Fourth Thursday of each month, opens at 6:30 AM; Clinic ends at 9 AM.  Patients are seen on a first-come first-served basis, and a limited number are seen during each clinic.   [facility]  [address] Ether Griffins [place], [place] [phone]   Eligibility Requirements You must have lived in [place], [place], or [place] counties for at least the last three months.   You cannot be eligible for state or federal sponsored [place], including [employer], [place],  or [employer].   You generally cannot be  eligible for healthcare insurance through your employer.    How to apply: Eligibility screenings are held every Tuesday and Wednesday afternoon from 1:00 pm until 4:00 pm. You do not need an appointment for the interview!  [facility] [address] [place], [place] [phone]   [facility] Health Department  [phone]   [facility] Health Department  [phone]   [facility] Health Department  [phone]    Behavioral Health Resources in the Community: Intensive Outpatient Programs Organization         Address  Phone  Notes  [facility] [address] [place], [place] [phone]   [facility] Outpatient [address] [place], [place] [phone]   [facility] [address] [place], [place]  [phone]   [facility] Mental Health [address],  [place], [place] [phone] or [phone]   Substance Abuse Resources Organization         Address  Phone  Notes  [facility]  [phone]   [facility]  [phone]   [facility] [place]  [phone]   Floydene Flock  [phone]   Residential & Outpatient Substance Abuse Program  [phone]   Psychological Services Organization         Address  Phone  Notes  [facility]  [phone]   [facility]  [phone]   [facility] Mental Health [address] [place] [phone] or [phone]    Mobile Crisis Teams Organization         Address  Phone  Notes  [facility], Mobile Crisis Care Unit  [phone]   [facility]  [address] [place], [place] [phone]   Doristine Locks [address] [place] [phone]    Self-Help/Support Groups Organization         Address  Phone             Notes  [facility] [place] - variety of support groups   336- I7437963 Call for more information  Narcotics Anonymous (NA), [facility] [address], [employer] [place]  2 meetings at this location   Statistician         Address  Phone  Notes  [facility] [address],    [place]  [phone]   [facility]  [address], [place] [address], [place], [place] [phone]   [facility] [address] [place], [place] [phone] Admissions: 8am-3pm M-F  [facility] [address],    [place], [place] [phone]   [facility] [address] [place], [place], [place] [phone]   [facility] [address],  [place], [place] [phone]   [facility] - Intensive Outpatient [address] Laurell Josephs [address], [place], [place] [phone]   [facility] (Addiction [facility] [address] [place]  [place], [place] [phone] or [phone]   [facility] ([facility]) [address] [place], [place] [phone] Accepts Medicaid  [facility] [address],  [place] [phone] Substance Abuse/Addiction Treatment   [facility] Organization         Address  Phone  Notes  [facility]  [phone]   Angie Fava, PhD [address] [place], [place]   [phone] or [phone]   [facility]   [address] [place],  [place] [phone]   [facility] [address], [place], [place] [phone] Insurance/Medicaid/sponsorship through [employer] and Families [address]                                    [place], [place] [phone] Therapy/tele-psych/case  [facility] [address]   [place], [place] [phone]    Dr. Lolly Mustache  [phone]   [facility]  [employer] [facility] Dept. 1) [address] [place] 2) [address] [place] 3)  [address], [place] [phone]  [phone]   [facility] Child Abuse Hotline [phone] or [phone] (After Hours)

## 2015-06-18 NOTE — ED Notes (Signed)
Pt c/o acid reflux.  Given ice chips as per her request.

## 2015-06-18 NOTE — ED Notes (Signed)
Pt states "she is two people".  Earlier she was argumentative and complaining.  Currently, she is very pleasant and cooperative.  Iv started, labs drawn and morphine given as ordered.

## 2015-06-18 NOTE — ED Notes (Signed)
Pt c/o left lower leg pain in the calf area.  She has a hx of DVT in right leg.  Wants to be sure that she doesn't have DVT in left leg.

## 2015-06-18 NOTE — ED Provider Notes (Signed)
CSN: [id number]     Arrival date & time 06/18/15  7829 History   First MD Initiated Contact with Patient 06/18/15 [phone]     Chief Complaint  Patient presents with  . Leg Pain    left lower    HPI   [name] [name] is a 74 y.o. female with a PMH of depression, schizophrenia, HLD, HTN, PE, DVT on coumadin, asthma, GERD, chronic pain, varicose veins who presents to the ED with left lower extremity pain. She reports she was standing all day yesterday and was not wearing her compression socks. She states her pain started around 9:00 PM last night, and has been constant since that time. She states standing for long periods of time makes her pain worse. She reports she takes tramadol at home, which has given her some relief. She states she has experienced this pain many times in the past with her varicose veins.   Past Medical History  Diagnosis Date  . Depression   . Schizophrenia   . Hyperlipidemia   . Hypertension   . Chronic pain     "over my whole body" (03/30/2013)  . 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 DVT (deep venous thrombosis)     per 01/17/10 d/c summary- "remote hx of dvt"?  . Iron deficiency anemia   . Glaucoma   . Hemorrhoids   . Asthma   . Anxiety   . GERD (gastroesophageal reflux disease)   . Psoriasis 04/29/2012    New onset evaluated by Dr Marylou Flesher, Dermatology, [facility] 6/13  Rx 0.1% Tacrolimus ointment  . Complication of anesthesia     "because I have sleep apnea" (03/30/2013)  . Heart murmur   . Chest pain, exertional   . Chronic bronchitis   . Sleep apnea     "waiting on my CPAP" (03/30/2013)  . Pneumonia     "a few times" (03/30/2013)  . Exertional shortness of breath     "& sometimes when laying down" (03/30/2013)  . Daily headache     "last 2 months" (03/30/2013)  . Arthritis     "joints" (03/30/2013)  . Varicose veins of legs   . Psoriasis    Past Surgical History  Procedure Laterality Date  . Spinal fusion      2004  . Carpal  tunnel release Bilateral   . Tubal ligation  1973  . Dilation and curettage of uterus  1970's    "once" (03/30/2013)  . 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 Joint replacement    . Vein surgery  left leg   Family History  Problem Relation Age of Onset  . Diabetes Sister   . Colon cancer Brother 40  . Colon cancer Brother    Social History  Substance Use Topics  . Smoking status: Never Smoker   . Smokeless tobacco: Never Used  . Alcohol Use: No   OB History    No data available      Review of Systems  Constitutional: Negative for fever and chills.  Respiratory: Negative for shortness of breath.   Cardiovascular: Positive for leg swelling. Negative for chest pain.  Gastrointestinal: Negative for nausea, vomiting, abdominal pain, diarrhea and constipation.  Skin: Positive for color change.       Reports chronic discoloration of left lower extremity.  Neurological: Positive for dizziness. Negative for weakness, light-headedness and headaches.       Reports chronic vertigo.      Allergies  Methadone and Codeine  Home Medications   Prior to Admission medications   Medication Sig Start Date End Date Taking? Authorizing Provider  albuterol (PROVENTIL HFA;VENTOLIN HFA) 108 (90 BASE) MCG/ACT inhaler Inhale 2 puffs into the lungs every 6 (six) hours as needed for wheezing or shortness of breath (wheezing). 12/08/14  Yes Tyrone Nine, MD  amLODipine (NORVASC) 10 MG tablet Take 10 mg by mouth daily. 04/18/15  Yes Historical Provider, MD  cephALEXin (KEFLEX) 250 MG capsule Take 500 mg by mouth 2 (two) times daily.  05/30/15  Yes Historical Provider, MD  esomeprazole (NEXIUM) 40 MG capsule Take 1 capsule (40 mg total) by mouth daily. 02/01/15  Yes Arby Barrette, MD  esomeprazole (NEXIUM) 40 MG capsule Take 40 mg by mouth daily. 05/05/15  Yes Historical Provider, MD  FLUoxetine (PROZAC) 20 MG capsule Take 3 capsules (60 mg total) by mouth daily. Patient taking differently: Take 20 mg by  mouth daily.  05/26/14  Yes Tyrone Nine, MD  gabapentin (NEURONTIN) 300 MG capsule Take 300 mg by mouth 3 (three) times daily.  05/07/15  Yes Historical Provider, MD  hydrOXYzine (VISTARIL) 25 MG capsule Take 25 mg by mouth daily as needed for anxiety.  06/09/15  Yes Historical Provider, MD  lisinopril (PRINIVIL,ZESTRIL) 20 MG tablet Take 1 tablet (20 mg total) by mouth daily. 12/08/14  Yes Tyrone Nine, MD  meclizine (ANTIVERT) 25 MG tablet Take 1 tablet (25 mg total) by mouth 3 (three) times daily as needed for dizziness. 02/01/15  Yes Arby Barrette, MD  metoCLOPramide (REGLAN) 10 MG tablet Take 10 mg by mouth every 6 (six) hours as needed for nausea.   Yes Historical Provider, MD  traMADol (ULTRAM) 50 MG tablet Take 1 tablet (50 mg total) by mouth every 6 (six) hours as needed. 06/12/15  Yes Kaitlyn Szekalski, PA-C  warfarin (COUMADIN) 5 MG tablet TAKE 1 TABLET BY MOUTH EVERY DAY Patient taking differently: TAKE 5 MG BY MOUTH DAILY. 10/18/14  Yes Levert Feinstein, MD  amLODipine (NORVASC) 5 MG tablet Take 5 mg by mouth daily. 05/05/15 05/04/16  Historical Provider, MD  atenolol (TENORMIN) 100 MG tablet TAKE 1 TABLET BY MOUTH EVERY DAY Patient not taking: Reported on 06/18/2015 02/20/15   Tyrone Nine, MD  atenolol (TENORMIN) 25 MG tablet Take 25 mg by mouth daily. 05/05/15 05/04/16  Historical Provider, MD  atorvastatin (LIPITOR) 80 MG tablet Take 80 mg by mouth daily. 05/05/15   Historical Provider, MD  beclomethasone (QVAR) 80 MCG/ACT inhaler Inhale 2 puffs into the lungs 2 (two) times daily as needed (wheezing).     Historical Provider, MD  diclofenac sodium (VOLTAREN) 1 % GEL Place 2 g onto the skin 4 (four) times daily as needed. Joint pain 05/05/15   Historical Provider, MD  FLOVENT DISKUS 250 MCG/BLIST AEPB Inhale 2 puffs into the lungs 2 (two) times daily. 04/20/15   Historical Provider, MD  fluticasone (FLOVENT HFA) 220 MCG/ACT inhaler Inhale 2 puffs into the lungs 2 (two) times daily. 05/05/15    Historical Provider, MD  furosemide (LASIX) 20 MG tablet Take 1 tablet (20 mg total) by mouth daily. Patient not taking: Reported on 06/18/2015 10/29/14   Tyrone Nine, MD  gabapentin (NEURONTIN) 400 MG capsule Take 1 capsule (400 mg total) by mouth 3 (three) times daily. 12/08/14   Tyrone Nine, MD  hydrocortisone 2.5 % cream Apply 1 application topically daily. 05/05/15 05/04/16  Historical Provider, MD  hydrOXYzine (ATARAX/VISTARIL) 10 MG tablet  Take 1 tablet (10 mg total) by mouth every 6 (six) hours as needed for itching (itching). Patient not taking: Reported on 06/18/2015 05/11/15   Paula Libra, MD  memantine (NAMENDA XR) 7 MG CP24 24 hr capsule Take 7 mg by mouth daily. 05/05/15   Historical Provider, MD  mometasone (ASMANEX) 220 MCG/INH inhaler Inhale 2 puffs into the lungs 2 (two) times daily. 05/05/15 05/04/16  Historical Provider, MD  silver sulfADIAZINE (SILVADENE) 1 % cream APPLY TOPICALLY EVERY DAY Patient not taking: Reported on 06/18/2015 12/10/14   Tyrone Nine, MD  triamcinolone cream (KENALOG) 0.1 % Apply 1 application topically 2 (two) times daily as needed (for legs). Patient not taking: Reported on 06/18/2015 10/29/14   Tyrone Nine, MD  warfarin (COUMADIN) 6 MG tablet Take 6 mg by mouth daily. 05/05/15 05/04/16  Historical Provider, MD    BP 155/88 mmHg  Pulse 69  Temp(Src) 97.6 F (36.4 C) (Oral)  Resp 18  SpO2 95%  LMP 11/22/1996 Physical Exam  Constitutional: She is oriented to person, place, and time. She appears well-developed and well-nourished. No distress.  Obese female in no acute distress.  HENT:  Head: Normocephalic and atraumatic.  Right Ear: External ear normal.  Left Ear: External ear normal.  Nose: Nose normal.  Mouth/Throat: Uvula is midline, oropharynx is clear and moist and mucous membranes are normal.  Eyes: Conjunctivae, EOM and lids are normal. Pupils are equal, round, and reactive to light. Right eye exhibits no discharge. Left eye exhibits no discharge. No  scleral icterus.  Neck: Normal range of motion. Neck supple.  Cardiovascular: Normal rate, regular rhythm, normal heart sounds, intact distal pulses and normal pulses.   Pulmonary/Chest: Effort normal and breath sounds normal. No respiratory distress. She has no wheezes. She has no rales. She exhibits no tenderness.  Abdominal: Soft. Normal appearance and bowel sounds are normal. She exhibits no distension and no mass. There is no tenderness. There is no rigidity, no rebound and no guarding.  Musculoskeletal: Normal range of motion. She exhibits tenderness. She exhibits no edema.  Mild TTP with palpable varicosities to posterior aspect of left lower extremity.  Neurological: She is alert and oriented to person, place, and time. She has normal strength. No sensory deficit.  Skin: Skin is warm, dry and intact. No rash noted. She is not diaphoretic. No erythema. No pallor.  Psychiatric: She has a normal mood and affect. Her speech is normal and behavior is normal. Judgment and thought content normal.  Nursing note and vitals reviewed.   ED Course  Procedures (including critical care time)  Labs Review Labs Reviewed  CBC WITH DIFFERENTIAL/PLATELET - Abnormal; Notable for the following:    RBC 3.55 (*)    Hemoglobin 11.0 (*)    HCT 33.3 (*)    All other components within normal limits  BASIC METABOLIC PANEL - Abnormal; Notable for the following:    Glucose, Bld 104 (*)    Creatinine, Ser 1.08 (*)    GFR calc non Af Amer 50 (*)    GFR calc Af Amer 58 (*)    All other components within normal limits    Imaging Review No results found.   I have personally reviewed and evaluated these images and lab results as part of my medical decision-making.   EKG Interpretation None      MDM   Final diagnoses:  Left leg pain  Varicose veins   74 year old female presents with left lower extremity pain, which  started yesterday after standing all day without compression socks. She states her  pain feels similar to the pain she has with her varicose veins.   Patient is afebrile. Vital signs stable. Mild TTP with palpable varicosities to posterior aspect of left lower extremity. Heart RRR, lungs clear to auscultation bilaterally. Abdomen soft, non-tender, non-distended.  Patient reports reflux. Given GI cocktail with significant symptom improvement. Will give prescription for nexium, which the patient states works for her.  LLE pain controlled in the ED. Will obtain doppler of LLE given history of DVT and current complaint of pain. Doppler negative for DVT. Patient stable for discharge at this time. Return precautions discussed. Patient to follow-up with PCP.  BP 155/88 mmHg  Pulse 69  Temp(Src) 97.6 F (36.4 C) (Oral)  Resp 18  SpO2 95%  LMP 11/22/1996     Mady Gemma, PA-C 06/18/15 [address] [place], [place] 06/18/15 1207  Gwyneth Sprout, MD 06/18/15 2158

## 2015-06-18 NOTE — Progress Notes (Signed)
*  PRELIMINARY RESULTS* Vascular Ultrasound Left lower extremity venous duplex has been completed.  Preliminary findings: No evidence of DVT or baker's cyst.  Farrel Demark, RDMS, RVT  06/18/2015, 11:43 AM

## 2015-06-18 NOTE — ED Notes (Signed)
Bed: WA09 Expected date:  Expected time:  Means of arrival:  Comments: EMS- Leg pain

## 2015-06-18 NOTE — ED Notes (Signed)
Response to med given for reflux (gi cocktail) - "so much better"

## 2015-06-22 ENCOUNTER — Emergency Department (HOSPITAL_COMMUNITY)
Admission: EM | Admit: 2015-06-22 | Discharge: 2015-06-23 | Disposition: A | Discharge status: Home / Self Care | Payer: Medicare Other | Attending: Emergency Medicine | Admitting: Emergency Medicine

## 2015-06-22 ENCOUNTER — Emergency Department (HOSPITAL_COMMUNITY): Payer: Medicare Other

## 2015-06-22 ENCOUNTER — Encounter (HOSPITAL_COMMUNITY): Payer: Self-pay

## 2015-06-22 DIAGNOSIS — Z79899 Other long term (current) drug therapy: Secondary | ICD-10-CM | POA: Insufficient documentation

## 2015-06-22 DIAGNOSIS — R42 Dizziness and giddiness: Secondary | ICD-10-CM | POA: Diagnosis not present

## 2015-06-22 DIAGNOSIS — J45909 Unspecified asthma, uncomplicated: Secondary | ICD-10-CM | POA: Diagnosis not present

## 2015-06-22 DIAGNOSIS — Z8719 Personal history of other diseases of the digestive system: Secondary | ICD-10-CM | POA: Insufficient documentation

## 2015-06-22 DIAGNOSIS — G8929 Other chronic pain: Secondary | ICD-10-CM | POA: Insufficient documentation

## 2015-06-22 DIAGNOSIS — Z8701 Personal history of pneumonia (recurrent): Secondary | ICD-10-CM | POA: Insufficient documentation

## 2015-06-22 DIAGNOSIS — Z8739 Personal history of other diseases of the musculoskeletal system and connective tissue: Secondary | ICD-10-CM | POA: Insufficient documentation

## 2015-06-22 DIAGNOSIS — Z7951 Long term (current) use of inhaled steroids: Secondary | ICD-10-CM | POA: Insufficient documentation

## 2015-06-22 DIAGNOSIS — Z86711 Personal history of pulmonary embolism: Secondary | ICD-10-CM | POA: Insufficient documentation

## 2015-06-22 DIAGNOSIS — Z862 Personal history of diseases of the blood and blood-forming organs and certain disorders involving the immune mechanism: Secondary | ICD-10-CM | POA: Insufficient documentation

## 2015-06-22 DIAGNOSIS — Z792 Long term (current) use of antibiotics: Secondary | ICD-10-CM | POA: Insufficient documentation

## 2015-06-22 DIAGNOSIS — Z7901 Long term (current) use of anticoagulants: Secondary | ICD-10-CM | POA: Insufficient documentation

## 2015-06-22 DIAGNOSIS — Z86018 Personal history of other benign neoplasm: Secondary | ICD-10-CM | POA: Diagnosis not present

## 2015-06-22 DIAGNOSIS — Z872 Personal history of diseases of the skin and subcutaneous tissue: Secondary | ICD-10-CM | POA: Diagnosis not present

## 2015-06-22 DIAGNOSIS — I10 Essential (primary) hypertension: Secondary | ICD-10-CM | POA: Diagnosis not present

## 2015-06-22 LAB — BASIC METABOLIC PANEL
Anion gap: 6 (ref 5–15)
BUN: 11 mg/dL (ref 6–20)
CHLORIDE: 105 mmol/L (ref 101–111)
CO2: 27 mmol/L (ref 22–32)
CREATININE: 1.01 mg/dL — AB (ref 0.44–1.00)
Calcium: 8.9 mg/dL (ref 8.9–10.3)
GFR calc Af Amer: >60 mL/min (ref >60)
GFR calc non Af Amer: 54 mL/min — ABNORMAL LOW (ref >60)
GLUCOSE: 84 mg/dL (ref 65–99)
POTASSIUM: 3.9 mmol/L (ref 3.5–5.1)
SODIUM: 138 mmol/L (ref 135–145)

## 2015-06-22 LAB — URINALYSIS, ROUTINE W REFLEX MICROSCOPIC
BILIRUBIN URINE: NEGATIVE
GLUCOSE, UA: NEGATIVE mg/dL
HGB URINE DIPSTICK: NEGATIVE
KETONES UR: NEGATIVE mg/dL
Leukocytes, UA: NEGATIVE
NITRITE: NEGATIVE
PH: 6.5 (ref 5.0–8.0)
Protein, ur: NEGATIVE mg/dL
SPECIFIC GRAVITY, URINE: 1.011 (ref 1.005–1.030)
Urobilinogen, UA: 0.2 mg/dL (ref 0.0–1.0)

## 2015-06-22 LAB — CBC
HEMATOCRIT: 31.3 % — AB (ref 36.0–46.0)
Hemoglobin: 10.4 g/dL — ABNORMAL LOW (ref 12.0–15.0)
MCH: 31.4 pg (ref 26.0–34.0)
MCHC: 33.2 g/dL (ref 30.0–36.0)
MCV: 94.6 fL (ref 78.0–100.0)
PLATELETS: 226 10*3/uL (ref 150–400)
RBC: 3.31 MIL/uL — ABNORMAL LOW (ref 3.87–5.11)
RDW: 15.1 % (ref 11.5–15.5)
WBC: 5.6 10*3/uL (ref 4.0–10.5)

## 2015-06-22 LAB — CBG MONITORING, ED: Glucose-Capillary: 77 mg/dL (ref 65–99)

## 2015-06-22 MED ORDER — SODIUM CHLORIDE 0.9 % IV BOLUS (SEPSIS): 500.0000 mL | Freq: Once | INTRAVENOUS | Status: DC

## 2015-06-22 MED ORDER — SODIUM CHLORIDE 0.9 % IV SOLN: Freq: Once | INTRAVENOUS | Status: DC

## 2015-06-22 MED ORDER — SODIUM CHLORIDE 0.9 % IV BOLUS (SEPSIS)
500.0000 mL | Freq: Once | INTRAVENOUS | Status: AC
  Administered 2015-06-22: 500 mL via INTRAVENOUS

## 2015-06-22 NOTE — ED Notes (Signed)
Pt to CT

## 2015-06-22 NOTE — ED Notes (Signed)
Per pt, states she has a history of vertigo-has been having symptoms for 3 days-per EMS, slept the whole way to the ED

## 2015-06-22 NOTE — ED Notes (Signed)
Pt states that she's not feeling herself and hasn't been taking her medications correctly, she's used to taking care of her family and now she says they are all in facilities, not sure if this is correct information.

## 2015-06-23 NOTE — ED Notes (Signed)
[facility] has been called  

## 2015-06-23 NOTE — Discharge Instructions (Signed)
Return here as needed.  Follow-up with your primary care doctor °

## 2015-06-23 NOTE — ED Provider Notes (Signed)
CSN: [id number]     Arrival date & time 06/22/15  1905 History   First MD Initiated Contact with Patient 06/22/15 2020     Chief Complaint  Patient presents with  . Dizziness     (Consider location/radiation/quality/duration/timing/severity/associated sxs/prior Treatment) HPI Patient presents to the emergency department with feeling dizzy and not like herself.  The patient states that she is unsure if she has been taking her medications correctly.  She said she is taking care of her family and now states they are all in facilities since that she has not sure what to do because she is upset about this.  The patient states that she does not have any chest pain, shortness breath, nausea, vomiting, back pain, fever, cough, dysuria, incontinence, or syncope.  Patient is very vague about her symptoms.  She states she does have a history of vertigo Past Medical History  Diagnosis Date  . Depression   . Schizophrenia   . Hyperlipidemia   . Hypertension   . Chronic pain     "over my whole body" (03/30/2013)  . 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 DVT (deep venous thrombosis)     per 01/17/10 d/c summary- "remote hx of dvt"?  . Iron deficiency anemia   . Glaucoma   . Hemorrhoids   . Asthma   . Anxiety   . GERD (gastroesophageal reflux disease)   . Psoriasis 04/29/2012    New onset evaluated by Dr Marylou Flesher, Dermatology, [facility] 6/13  Rx 0.1% Tacrolimus ointment  . Complication of anesthesia     "because I have sleep apnea" (03/30/2013)  . Heart murmur   . Chest pain, exertional   . Chronic bronchitis   . Sleep apnea     "waiting on my CPAP" (03/30/2013)  . Pneumonia     "a few times" (03/30/2013)  . Exertional shortness of breath     "& sometimes when laying down" (03/30/2013)  . Daily headache     "last 2 months" (03/30/2013)  . Arthritis     "joints" (03/30/2013)  . Varicose veins of legs   . Psoriasis    Past Surgical History  Procedure Laterality Date  .  Spinal fusion      2004  . Carpal tunnel release Bilateral   . Tubal ligation  1973  . Dilation and curettage of uterus  1970's    "once" (03/30/2013)  . 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 Joint replacement    . Vein surgery  left leg   Family History  Problem Relation Age of Onset  . Diabetes Sister   . Colon cancer Brother 40  . Colon cancer Brother    Social History  Substance Use Topics  . Smoking status: Never Smoker   . Smokeless tobacco: Never Used  . Alcohol Use: No   OB History    No data available     Review of Systems All other systems negative except as documented in the HPI. All pertinent positives and negatives as reviewed in the HPI.   Allergies  Methadone and Codeine  Home Medications   Prior to Admission medications   Medication Sig Start Date End Date Taking? Authorizing Provider  albuterol (PROVENTIL HFA;VENTOLIN HFA) 108 (90 BASE) MCG/ACT inhaler Inhale 2 puffs into the lungs every 6 (six) hours as needed for wheezing or shortness of breath (wheezing). 12/08/14  Yes Tyrone Nine, MD  esomeprazole (NEXIUM) 40 MG capsule Take 1 capsule (  40 mg total) by mouth daily. 06/18/15  Yes Mady Gemma, PA-C  FLUoxetine (PROZAC) 20 MG capsule Take 3 capsules (60 mg total) by mouth daily. Patient taking differently: Take 20 mg by mouth daily.  05/26/14  Yes Tyrone Nine, MD  gabapentin (NEURONTIN) 400 MG capsule Take 1 capsule (400 mg total) by mouth 3 (three) times daily. 12/08/14  Yes Tyrone Nine, MD  warfarin (COUMADIN) 5 MG tablet TAKE 1 TABLET BY MOUTH EVERY DAY Patient taking differently: TAKE 5 MG BY MOUTH DAILY. 10/18/14  Yes Levert Feinstein, MD  amLODipine (NORVASC) 10 MG tablet Take 10 mg by mouth daily. 04/18/15   Historical Provider, MD  amLODipine (NORVASC) 5 MG tablet Take 5 mg by mouth daily. 05/05/15 05/04/16  Historical Provider, MD  atenolol (TENORMIN) 100 MG tablet TAKE 1 TABLET BY MOUTH EVERY DAY Patient not taking: Reported on 06/18/2015  02/20/15   Tyrone Nine, MD  atenolol (TENORMIN) 25 MG tablet Take 25 mg by mouth daily. 05/05/15 05/04/16  Historical Provider, MD  atorvastatin (LIPITOR) 80 MG tablet Take 80 mg by mouth daily. 05/05/15   Historical Provider, MD  beclomethasone (QVAR) 80 MCG/ACT inhaler Inhale 2 puffs into the lungs 2 (two) times daily as needed (wheezing).     Historical Provider, MD  cephALEXin (KEFLEX) 250 MG capsule Take 500 mg by mouth 2 (two) times daily.  05/30/15   Historical Provider, MD  diclofenac sodium (VOLTAREN) 1 % GEL Place 2 g onto the skin 4 (four) times daily as needed. Joint pain 05/05/15   Historical Provider, MD  FLOVENT DISKUS 250 MCG/BLIST AEPB Inhale 2 puffs into the lungs 2 (two) times daily. 04/20/15   Historical Provider, MD  fluticasone (FLOVENT HFA) 220 MCG/ACT inhaler Inhale 2 puffs into the lungs 2 (two) times daily. 05/05/15   Historical Provider, MD  furosemide (LASIX) 20 MG tablet Take 1 tablet (20 mg total) by mouth daily. Patient not taking: Reported on 06/18/2015 10/29/14   Tyrone Nine, MD  gabapentin (NEURONTIN) 300 MG capsule Take 300 mg by mouth 3 (three) times daily.  05/07/15   Historical Provider, MD  hydrocortisone 2.5 % cream Apply 1 application topically daily. 05/05/15 05/04/16  Historical Provider, MD  hydrOXYzine (ATARAX/VISTARIL) 10 MG tablet Take 1 tablet (10 mg total) by mouth every 6 (six) hours as needed for itching (itching). Patient not taking: Reported on 06/18/2015 05/11/15   Paula Libra, MD  hydrOXYzine (VISTARIL) 25 MG capsule Take 25 mg by mouth daily as needed for anxiety.  06/09/15   Historical Provider, MD  lisinopril (PRINIVIL,ZESTRIL) 20 MG tablet Take 1 tablet (20 mg total) by mouth daily. 12/08/14   Tyrone Nine, MD  meclizine (ANTIVERT) 25 MG tablet Take 1 tablet (25 mg total) by mouth 3 (three) times daily as needed for dizziness. 02/01/15   Arby Barrette, MD  memantine (NAMENDA XR) 7 MG CP24 24 hr capsule Take 7 mg by mouth daily. 05/05/15   Historical Provider,  MD  metoCLOPramide (REGLAN) 10 MG tablet Take 10 mg by mouth every 6 (six) hours as needed for nausea.    Historical Provider, MD  mometasone [facility]) 220 MCG/INH inhaler Inhale 2 puffs into the lungs 2 (two) times daily. 05/05/15 05/04/16  Historical Provider, MD  silver sulfADIAZINE (SILVADENE) 1 % cream APPLY TOPICALLY EVERY DAY Patient not taking: Reported on 06/18/2015 12/10/14   Tyrone Nine, MD  traMADol (ULTRAM) 50 MG tablet Take 1 tablet (50 mg total) by mouth  every 6 (six) hours as needed. 06/12/15   Emilia Beck, PA-C  triamcinolone cream (KENALOG) 0.1 % Apply 1 application topically 2 (two) times daily as needed (for legs). Patient not taking: Reported on 06/18/2015 10/29/14   Tyrone Nine, MD  warfarin (COUMADIN) 6 MG tablet Take 6 mg by mouth daily. 05/05/15 05/04/16  Historical Provider, MD   BP 146/80 mmHg  Pulse 51  Temp(Src) 98.3 F (36.8 C) (Oral)  Resp 15  SpO2 94%  LMP 11/22/1996 Physical Exam  Constitutional: She is oriented to person, place, and time. She appears well-developed and well-nourished. No distress.  HENT:  Head: Normocephalic and atraumatic.  Mouth/Throat: Oropharynx is clear and moist.  Eyes: Pupils are equal, round, and reactive to light.  Neck: Normal range of motion. Neck supple.  Cardiovascular: Normal rate, regular rhythm and normal heart sounds.  Exam reveals no gallop.   No murmur heard. Pulmonary/Chest: Effort normal and breath sounds normal. No respiratory distress.  Neurological: She is alert and oriented to person, place, and time. She exhibits normal muscle tone. Coordination normal.  Skin: Skin is warm and dry. No rash noted. No erythema.  Psychiatric: She has a normal mood and affect. Her behavior is normal.  Nursing note and vitals reviewed.   ED Course  Procedures (including critical care time) Labs Review Labs Reviewed  BASIC METABOLIC PANEL - Abnormal; Notable for the following:    Creatinine, Ser 1.01 (*)    GFR calc non Af  Amer 54 (*)    All other components within normal limits  CBC - Abnormal; Notable for the following:    RBC 3.31 (*)    Hemoglobin 10.4 (*)    HCT 31.3 (*)    All other components within normal limits  URINALYSIS, ROUTINE W REFLEX MICROSCOPIC (NOT AT [facility])  CBG MONITORING, ED    Imaging Review Ct Head Wo Contrast  06/22/2015   CLINICAL DATA:  Dizziness.  Vertigo for 3 days.  EXAM: CT HEAD WITHOUT CONTRAST  TECHNIQUE: Contiguous axial images were obtained from the base of the skull through the vertex without intravenous contrast.  COMPARISON:  Head CT 10 days prior 06/12/2015  FINDINGS: Stable normal for age-related atrophy and chronic small vessel ischemia. No intracranial hemorrhage, mass effect, or midline shift. No hydrocephalus. The basilar cisterns are patent. No evidence of territorial infarct. No intracranial fluid collection. There is atherosclerosis of the skullbase vasculature. Calvarium is intact. Included paranasal sinuses and mastoid air cells are well aerated.  IMPRESSION: No acute intracranial abnormality.   Electronically Signed   By: [name] M.D.   On: 06/22/2015 23:33   I have personally reviewed and evaluated these images and lab results as part of my medical decision-making.  Patient's been stable here in the emergency department.  She does not appear in any acute distress not have any neurological deficits.  I will have the patient follow up with her primary care Dr. told to return here as needed    Charlestine Night, PA-C 06/23/15 0101  Bethann Berkshire, MD 06/27/15 1230

## 2015-06-24 ENCOUNTER — Emergency Department (HOSPITAL_COMMUNITY): Payer: Medicare Other

## 2015-06-24 ENCOUNTER — Encounter (HOSPITAL_COMMUNITY): Payer: Self-pay | Admitting: Emergency Medicine

## 2015-06-24 ENCOUNTER — Encounter: Payer: Self-pay | Admitting: Pharmacist

## 2015-06-24 ENCOUNTER — Emergency Department (HOSPITAL_COMMUNITY)
Admission: EM | Admit: 2015-06-24 | Discharge: 2015-06-25 | Disposition: A | Discharge status: Home / Self Care | Payer: Medicare Other | Attending: Physician Assistant | Admitting: Physician Assistant

## 2015-06-24 DIAGNOSIS — K219 Gastro-esophageal reflux disease without esophagitis: Secondary | ICD-10-CM | POA: Insufficient documentation

## 2015-06-24 DIAGNOSIS — Z8739 Personal history of other diseases of the musculoskeletal system and connective tissue: Secondary | ICD-10-CM | POA: Diagnosis not present

## 2015-06-24 DIAGNOSIS — J45909 Unspecified asthma, uncomplicated: Secondary | ICD-10-CM | POA: Insufficient documentation

## 2015-06-24 DIAGNOSIS — Y998 Other external cause status: Secondary | ICD-10-CM | POA: Insufficient documentation

## 2015-06-24 DIAGNOSIS — I10 Essential (primary) hypertension: Secondary | ICD-10-CM | POA: Diagnosis not present

## 2015-06-24 DIAGNOSIS — Z79899 Other long term (current) drug therapy: Secondary | ICD-10-CM

## 2015-06-24 DIAGNOSIS — Z7901 Long term (current) use of anticoagulants: Secondary | ICD-10-CM | POA: Diagnosis not present

## 2015-06-24 DIAGNOSIS — Z7951 Long term (current) use of inhaled steroids: Secondary | ICD-10-CM | POA: Diagnosis not present

## 2015-06-24 DIAGNOSIS — R011 Cardiac murmur, unspecified: Secondary | ICD-10-CM | POA: Insufficient documentation

## 2015-06-24 DIAGNOSIS — Y9289 Other specified places as the place of occurrence of the external cause: Secondary | ICD-10-CM | POA: Diagnosis not present

## 2015-06-24 DIAGNOSIS — W1839XA Other fall on same level, initial encounter: Secondary | ICD-10-CM | POA: Insufficient documentation

## 2015-06-24 DIAGNOSIS — Z86711 Personal history of pulmonary embolism: Secondary | ICD-10-CM | POA: Insufficient documentation

## 2015-06-24 DIAGNOSIS — Z862 Personal history of diseases of the blood and blood-forming organs and certain disorders involving the immune mechanism: Secondary | ICD-10-CM | POA: Diagnosis not present

## 2015-06-24 DIAGNOSIS — Z8669 Personal history of other diseases of the nervous system and sense organs: Secondary | ICD-10-CM | POA: Insufficient documentation

## 2015-06-24 DIAGNOSIS — Z043 Encounter for examination and observation following other accident: Secondary | ICD-10-CM | POA: Diagnosis not present

## 2015-06-24 DIAGNOSIS — Z792 Long term (current) use of antibiotics: Secondary | ICD-10-CM | POA: Insufficient documentation

## 2015-06-24 DIAGNOSIS — Z86718 Personal history of other venous thrombosis and embolism: Secondary | ICD-10-CM | POA: Diagnosis not present

## 2015-06-24 DIAGNOSIS — R400 Somnolence: Secondary | ICD-10-CM | POA: Diagnosis not present

## 2015-06-24 DIAGNOSIS — Y9389 Activity, other specified: Secondary | ICD-10-CM | POA: Insufficient documentation

## 2015-06-24 DIAGNOSIS — G8929 Other chronic pain: Secondary | ICD-10-CM | POA: Insufficient documentation

## 2015-06-24 DIAGNOSIS — Z8701 Personal history of pneumonia (recurrent): Secondary | ICD-10-CM | POA: Diagnosis not present

## 2015-06-24 DIAGNOSIS — Z872 Personal history of diseases of the skin and subcutaneous tissue: Secondary | ICD-10-CM | POA: Diagnosis not present

## 2015-06-24 DIAGNOSIS — W19XXXA Unspecified fall, initial encounter: Secondary | ICD-10-CM

## 2015-06-24 LAB — RAPID URINE DRUG SCREEN, HOSP PERFORMED
Amphetamines: NOT DETECTED
Barbiturates: NOT DETECTED
Benzodiazepines: POSITIVE — AB
Cocaine: NOT DETECTED
Opiates: NOT DETECTED
Tetrahydrocannabinol: NOT DETECTED

## 2015-06-24 LAB — URINALYSIS, ROUTINE W REFLEX MICROSCOPIC
Bilirubin Urine: NEGATIVE
Glucose, UA: NEGATIVE mg/dL
Hgb urine dipstick: NEGATIVE
Ketones, ur: NEGATIVE mg/dL
Leukocytes, UA: NEGATIVE
Nitrite: NEGATIVE
Protein, ur: NEGATIVE mg/dL
Specific Gravity, Urine: 1.009 (ref 1.005–1.030)
Urobilinogen, UA: 0.2 mg/dL (ref 0.0–1.0)
pH: 6.5 (ref 5.0–8.0)

## 2015-06-24 LAB — BASIC METABOLIC PANEL WITH GFR
Anion gap: 7 (ref 5–15)
BUN: <5 mg/dL — ABNORMAL LOW (ref 6–20)
CO2: 30 mmol/L (ref 22–32)
Calcium: 9.6 mg/dL (ref 8.9–10.3)
Chloride: 103 mmol/L (ref 101–111)
Creatinine, Ser: 1.07 mg/dL — ABNORMAL HIGH (ref 0.44–1.00)
GFR calc Af Amer: 59 mL/min — ABNORMAL LOW
GFR calc non Af Amer: 51 mL/min — ABNORMAL LOW
Glucose, Bld: 91 mg/dL (ref 65–99)
Potassium: 3.9 mmol/L (ref 3.5–5.1)
Sodium: 140 mmol/L (ref 135–145)

## 2015-06-24 LAB — I-STAT TROPONIN, ED: Troponin i, poc: 0 ng/mL (ref 0.00–0.08)

## 2015-06-24 LAB — CBC WITH DIFFERENTIAL/PLATELET
Basophils Absolute: 0 K/uL (ref 0.0–0.1)
Basophils Relative: 0 %
Eosinophils Absolute: 0.2 K/uL (ref 0.0–0.7)
Eosinophils Relative: 3 %
HCT: 34.6 % — ABNORMAL LOW (ref 36.0–46.0)
Hemoglobin: 11.4 g/dL — ABNORMAL LOW (ref 12.0–15.0)
Lymphocytes Relative: 19 %
Lymphs Abs: 1.4 K/uL (ref 0.7–4.0)
MCH: 31 pg (ref 26.0–34.0)
MCHC: 32.9 g/dL (ref 30.0–36.0)
MCV: 94 fL (ref 78.0–100.0)
Monocytes Absolute: 0.4 K/uL (ref 0.1–1.0)
Monocytes Relative: 6 %
Neutro Abs: 5.1 K/uL (ref 1.7–7.7)
Neutrophils Relative %: 72 %
Platelets: 222 K/uL (ref 150–400)
RBC: 3.68 MIL/uL — ABNORMAL LOW (ref 3.87–5.11)
RDW: 15 % (ref 11.5–15.5)
WBC: 7.1 K/uL (ref 4.0–10.5)

## 2015-06-24 LAB — ETHANOL: Alcohol, Ethyl (B): <5 mg/dL

## 2015-06-24 LAB — SALICYLATE LEVEL: Salicylate Lvl: <4 mg/dL (ref 2.8–30.0)

## 2015-06-24 LAB — PROTIME-INR
INR: 1.19 (ref 0.00–1.49)
Prothrombin Time: 15.3 s — ABNORMAL HIGH (ref 11.6–15.2)

## 2015-06-24 LAB — CBG MONITORING, ED
GLUCOSE-CAPILLARY: 101 mg/dL — AB (ref 65–99)
GLUCOSE-CAPILLARY: 81 mg/dL (ref 65–99)

## 2015-06-24 LAB — ACETAMINOPHEN LEVEL: Acetaminophen (Tylenol), Serum: <10 ug/mL — ABNORMAL LOW (ref 10–30)

## 2015-06-24 NOTE — ED Notes (Signed)
CBG 81.  

## 2015-06-24 NOTE — Progress Notes (Signed)
Have contacted scheduling with Dr. Cyndie [facility] to see about setting patient up for INR check in the next few weeks. There have been problems in the past with keeping appointments. INR frequently checked in ED (last 04/29/15).   Thank you,  Christell Faith, PharmD, BCOP

## 2015-06-24 NOTE — ED Notes (Signed)
Holding on a walker to be able to ambulate pt in hallway.

## 2015-06-24 NOTE — ED Provider Notes (Signed)
CSN: [id number]     Arrival date & time 06/24/15  1845 History   First MD Initiated Contact with Patient 06/24/15 1923     Chief Complaint  Patient presents with  . Fall     (Consider location/radiation/quality/duration/timing/severity/associated sxs/prior Treatment) HPI Comments: [name] [name] is a 74 y.o F with a pmhx of multiple psych disorders, chronic pain, HTN, HLD, PE, DVT with multiple visits to the emergency department in the last 6 months who presents to the ED today for evaluation after fall. Patient extremely somnolent during exam. Difficult to obtain ROS or history. Patient accompanied by husband who states that patient was visiting son in the hospital when she fell twice. Falls were unwitnessed by husband. Difficult to ascertain whether these were syncopal episodes or mechanical falls. Patient states that she has been taking too much of her medication however cannot state which ones or give much more information. Patient on Coumadin. Is not sure if she hit her head during the fall. Denies dizziness, lightheadedness, nausea, shortness of breath, muscle pain, weakness, headache, blurry vision.  Patient is a 74 y.o. female presenting with fall. The history is provided by the patient.  Fall    Past Medical History  Diagnosis Date  . Depression   . Schizophrenia   . Hyperlipidemia   . Hypertension   . Chronic pain     "over my whole body" (03/30/2013)  . 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 DVT (deep venous thrombosis)     per 01/17/10 d/c summary- "remote hx of dvt"?  . Iron deficiency anemia   . Glaucoma   . Hemorrhoids   . Asthma   . Anxiety   . GERD (gastroesophageal reflux disease)   . Psoriasis 04/29/2012    New onset evaluated by Dr Marylou Flesher, Dermatology, [facility] 6/13  Rx 0.1% Tacrolimus ointment  . Complication of anesthesia     "because I have sleep apnea" (03/30/2013)  . Heart murmur   . Chest pain, exertional   . Chronic  bronchitis   . Sleep apnea     "waiting on my CPAP" (03/30/2013)  . Pneumonia     "a few times" (03/30/2013)  . Exertional shortness of breath     "& sometimes when laying down" (03/30/2013)  . Daily headache     "last 2 months" (03/30/2013)  . Arthritis     "joints" (03/30/2013)  . Varicose veins of legs   . Psoriasis    Past Surgical History  Procedure Laterality Date  . Spinal fusion      2004  . Carpal tunnel release Bilateral   . Tubal ligation  1973  . Dilation and curettage of uterus  1970's    "once" (03/30/2013)  . 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 Joint replacement    . Vein surgery  left leg   Family History  Problem Relation Age of Onset  . Diabetes Sister   . Colon cancer Brother 40  . Colon cancer Brother    Social History  Substance Use Topics  . Smoking status: Never Smoker   . Smokeless tobacco: Never Used  . Alcohol Use: No   OB History    No data available     Review of Systems  All other systems reviewed and are negative.     Allergies  Methadone and Codeine  Home Medications   Prior to Admission medications   Medication Sig Start Date End Date Taking? Authorizing Provider  albuterol (PROVENTIL HFA;VENTOLIN HFA) 108 (90 BASE) MCG/ACT inhaler Inhale 2 puffs into the lungs every 6 (six) hours as needed for wheezing or shortness of breath (wheezing). 12/08/14  Yes Tyrone Nine, MD  amLODipine (NORVASC) 10 MG tablet Take 10 mg by mouth daily. 04/18/15  Yes Historical Provider, MD  atenolol (TENORMIN) 100 MG tablet TAKE 1 TABLET BY MOUTH EVERY DAY 02/20/15  Yes Tyrone Nine, MD  furosemide (LASIX) 20 MG tablet Take 1 tablet (20 mg total) by mouth daily. 10/29/14  Yes Tyrone Nine, MD  gabapentin (NEURONTIN) 300 MG capsule Take 300 mg by mouth 3 (three) times daily.  05/07/15  Yes Historical Provider, MD  potassium chloride SA (K-DUR,KLOR-CON) 20 MEQ tablet Take 20 mEq by mouth daily.   Yes Historical Provider, MD  traMADol (ULTRAM) 50 MG tablet Take 1  tablet (50 mg total) by mouth every 6 (six) hours as needed. 06/12/15  Yes Kaitlyn Szekalski, PA-C  warfarin (COUMADIN) 5 MG tablet TAKE 1 TABLET BY MOUTH EVERY DAY Patient taking differently: TAKE 5 MG BY MOUTH DAILY. 10/18/14  Yes Levert Feinstein, MD  warfarin (COUMADIN) 5 MG tablet Take 5 mg by mouth daily at 6 PM.   Yes Historical Provider, MD  atorvastatin (LIPITOR) 80 MG tablet Take 80 mg by mouth daily. 05/05/15   Historical Provider, MD  beclomethasone (QVAR) 80 MCG/ACT inhaler Inhale 2 puffs into the lungs 2 (two) times daily as needed (wheezing).     Historical Provider, MD  cephALEXin (KEFLEX) 250 MG capsule Take 500 mg by mouth 2 (two) times daily.  05/30/15   Historical Provider, MD  diclofenac sodium (VOLTAREN) 1 % GEL Place 2 g onto the skin 4 (four) times daily as needed. Joint pain 05/05/15   Historical Provider, MD  esomeprazole (NEXIUM) 40 MG capsule Take 1 capsule (40 mg total) by mouth daily. 06/18/15   Mady Gemma, PA-C  FLOVENT DISKUS 250 MCG/BLIST AEPB Inhale 2 puffs into the lungs 2 (two) times daily. 04/20/15   Historical Provider, MD  FLUoxetine (PROZAC) 20 MG capsule Take 3 capsules (60 mg total) by mouth daily. Patient taking differently: Take 20 mg by mouth daily.  05/26/14   Tyrone Nine, MD  fluticasone (FLOVENT HFA) 220 MCG/ACT inhaler Inhale 2 puffs into the lungs 2 (two) times daily. 05/05/15   Historical Provider, MD  gabapentin (NEURONTIN) 400 MG capsule Take 1 capsule (400 mg total) by mouth 3 (three) times daily. 12/08/14   Tyrone Nine, MD  hydrocortisone 2.5 % cream Apply 1 application topically daily. 05/05/15 05/04/16  Historical Provider, MD  hydrOXYzine (ATARAX/VISTARIL) 10 MG tablet Take 1 tablet (10 mg total) by mouth every 6 (six) hours as needed for itching (itching). Patient not taking: Reported on 06/18/2015 05/11/15   Paula Libra, MD  hydrOXYzine (VISTARIL) 25 MG capsule Take 25 mg by mouth daily as needed for anxiety.  06/09/15   Historical Provider,  MD  lisinopril (PRINIVIL,ZESTRIL) 20 MG tablet Take 1 tablet (20 mg total) by mouth daily. 12/08/14   Tyrone Nine, MD  meclizine (ANTIVERT) 25 MG tablet Take 1 tablet (25 mg total) by mouth 3 (three) times daily as needed for dizziness. 02/01/15   Arby Barrette, MD  memantine (NAMENDA XR) 7 MG CP24 24 hr capsule Take 7 mg by mouth daily. 05/05/15   Historical Provider, MD  metoCLOPramide (REGLAN) 10 MG tablet Take 10 mg by mouth every 6 (six) hours as needed for nausea.  Historical Provider, MD  mometasone [facility]) 220 MCG/INH inhaler Inhale 2 puffs into the lungs 2 (two) times daily. 05/05/15 05/04/16  Historical Provider, MD  silver sulfADIAZINE (SILVADENE) 1 % cream APPLY TOPICALLY EVERY DAY Patient not taking: Reported on 06/18/2015 12/10/14   Tyrone Nine, MD  triamcinolone cream (KENALOG) 0.1 % Apply 1 application topically 2 (two) times daily as needed (for legs). Patient not taking: Reported on 06/18/2015 10/29/14   Tyrone Nine, MD  warfarin (COUMADIN) 6 MG tablet Take 6 mg by mouth daily. 05/05/15 05/04/16  Historical Provider, MD   BP 166/87 mmHg  Pulse 62  Temp(Src) 98.5 F (36.9 C) (Oral)  Resp 18  Ht  (1.651 m)  Wt 240 lb (108.863 kg)  BMI 39.94 kg/m2  SpO2 99%  LMP 11/22/1996 Physical Exam  Constitutional: She is oriented to person, place, and time. She appears well-developed and well-nourished. No distress.  HENT:  Head: Normocephalic and atraumatic.  Mouth/Throat: Oropharynx is clear and moist. No oropharyngeal exudate.  Eyes: Conjunctivae and EOM are normal. Pupils are equal, round, and reactive to light. Right eye exhibits no discharge. Left eye exhibits no discharge. No scleral icterus.  Cardiovascular: Normal rate, regular rhythm, normal heart sounds and intact distal pulses.  Exam reveals no gallop and no friction rub.   No murmur heard. Pulmonary/Chest: Effort normal and breath sounds normal. No respiratory distress. She has no wheezes. She has no rales. She  exhibits no tenderness.  Abdominal: Soft. Bowel sounds are normal. She exhibits no distension and no mass. There is no tenderness. There is no rebound and no guarding.  Musculoskeletal: Normal range of motion. She exhibits no edema.  Lymphadenopathy:    She has no cervical adenopathy.  Neurological: She is alert and oriented to person, place, and time. No cranial nerve deficit.  Strength 5/5 throughout. No sensory deficits.    Skin: Skin is warm and dry. No rash noted. She is not diaphoretic. No erythema. No pallor.  Psychiatric:  Strange affect. Very drowsy. 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  Nursing note and vitals reviewed.   ED Course  Procedures (including critical care time) Labs Review Labs Reviewed  CBC WITH DIFFERENTIAL/PLATELET - Abnormal; Notable for the following:    RBC 3.68 (*)    Hemoglobin 11.4 (*)    HCT 34.6 (*)    All other components within normal limits  PROTIME-INR - Abnormal; Notable for the following:    Prothrombin Time 15.3 (*)    All other components within normal limits  CBG MONITORING, ED - Abnormal; Notable for the following:    Glucose-Capillary 101 (*)    All other components within normal limits  BASIC METABOLIC PANEL  URINALYSIS, ROUTINE W REFLEX MICROSCOPIC (NOT AT [facility])  ACETAMINOPHEN LEVEL  SALICYLATE LEVEL  ETHANOL  URINE RAPID DRUG SCREEN, HOSP PERFORMED  POCT CBG (FASTING - GLUCOSE)-MANUAL ENTRY  I-STAT TROPOININ, ED    Imaging Review Ct Head Wo Contrast  06/22/2015   CLINICAL DATA:  Dizziness.  Vertigo for 3 days.  EXAM: CT HEAD WITHOUT CONTRAST  TECHNIQUE: Contiguous axial images were obtained from the base of the skull through the vertex without intravenous contrast.  COMPARISON:  Head CT 10 days prior 06/12/2015  FINDINGS: Stable normal for age-related atrophy and chronic small vessel ischemia. No intracranial hemorrhage, mass effect, or midline shift. No hydrocephalus. The basilar cisterns are patent. No  evidence of territorial infarct. No intracranial fluid collection. There is atherosclerosis of the skullbase  vasculature. Calvarium is intact. Included paranasal sinuses and mastoid air cells are well aerated.  IMPRESSION: No acute intracranial abnormality.   Electronically Signed   By: [name] M.D.   On: 06/22/2015 23:33   I have personally reviewed and evaluated these images and lab results as part of my medical decision-making.   EKG Interpretation   Date/Time:  Friday June 24 2015 20:29:22 EDT Ventricular Rate:  61 PR Interval:  187 QRS Duration: 82 QT Interval:  417 QTC Calculation: 420 R Axis:   37 Text Interpretation:  Sinus rhythm Abnormal R-wave progression, early  transition no acute ischemia No significant change since last tracing  Confirmed by Kandis Mannan ([id number]) on 06/24/2015 9:01:27 PM      MDM   Final diagnoses:  Somnolence  Polypharmacy  Fall, initial encounter    Patient with multiple comorbidities and psych disorder presents to be evaluated after a fall. Patient actually somnolent during exam. Will CT head. We'll also perform urine drug screen and tox screen as patient states she is taking "too much of her medication". Patient on Coumadin will check INR.  EKG wnl. CT head negative. All labs within normal limits. However, INR not therapeutic at this time. Ideal 2.5-3.5 for PE anticoagulation. Patient does not have PCP and is not having Coumadin checks every 2 weeks. Discussed this with patient. We will refer her to primary care at [facility].  Patient ambulated in emergency department with walker. Discussed at length with patient and patient's husband about the importance of taking medication only as prescribed and not over serving. Patient states she "abuses her pain medications". Given extensive education on not to do that, increase his fall risk. Patient and husband are agreeable.  Patient states her house is set up for handicap  accessible. Patient feels safe to go home. Patient's husband states he is able to care for patient at this time.  Vital signs stable. Patient stable for discharge. Return precautions outlined in patient discharge instructions.  Patient was discussed with and seen by Dr. Corlis Leak who agrees with the treatment plan.    Lester Kinsman [place], PA-C 06/25/15 0042  Courteney Randall An, MD 06/25/15 [phone]

## 2015-06-24 NOTE — ED Notes (Signed)
Pt was visiting her son at a [facility] when she fell twice- fell in laundry room, then was assisted to her son's room. Pt fell again once she was in the room. Pt has been having trouble walking the past 3-4 months having frequent falls. Pt was seen here yesterday for falls as well. Pt did not hit her head. Pt complaining of L hip pain. BP 140/100, 94% on room air, tempo 98.5, HR 92-93. Pt alert and oriented. 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

## 2015-06-25 NOTE — Discharge Instructions (Signed)
Basics of Medication Management  UNDERSTAND YOUR MEDICATIONS   Read all of the labels and inserts that come with your medications. Review the information on this form often.   Know what potential side effects to look for (for each medication).   Know what each of your medications look like (by color, shape, size, stamp). If you are getting confused and having a hard time telling them apart, talk with your caregiver or pharmacist. They may be able to change the medication or help you to identify them more easily.   Check with your pharmacist if you notice a difference in the size or color of your medication.   Get all of your medications at one pharmacy. The pharmacist will have all of your information and understand possible drug interactions.   Ask your caregiver questions about your prescriptions and any over-the-counter medications, vitamins, herbal or dietary supplements that you take.  TAKE YOUR MEDICATION SAFELY   Take medications only as prescribed.   Talk with your caregiver or pharmacist if some of your pills look the same and it is difficult to tell them apart. They can help you to recognize different medications.   Never double up on your medication.   Never take anyone else's medication or share your medications.   Do not stop taking your medication(s) unless you have discussed it with your caregiver.   Do not split, mash, or chew medications unless your caregiver tells you to do so. Tell your caregiver if you have trouble swallowing your medication(s).   For liquid medications, make sure you use the dosing container provided to you.   You may need to avoid alcohol or certain foods or liquids with one or more of your medications. Make sure you remember how to take each medication with some of the tools below.  ORGANIZE YOUR MEDICATIONS   Use a tool, such as a weekly pill box (available at your local pharmacy), written chart from your caregiver, notebook, binder, or your own calendar to  organize your daily medications. Please note: if you are having trouble telling your different medications apart, keep them in the original bottles.   Set cues or reminders for taking your medications. Use watch alarms, mobile device/phone calendar alarms, or sticky notes.   More advanced medication management systems are also available. These offer weekly or monthly options complete with storage, alarms, and visual and audio prompts.   Your system should help you to keep track of the:   Name of medication and dose.   Day.   Time.   Pill to take (by color, shape, size, or name/imprint).   How to take it (with or without certain foods, on an empty stomach, with fluids etc.).   Review your medication schedule with a family member or friend to help you. Other household members should understand your medications.   If you are taking medications on an "as needed" basis such as those for nausea or pain, write down the name, dose, and time you took the medication so that you remember what you have taken.  PLAN AHEAD FOR REFILLS AND TRAVEL   Take your pill box, medications, and calendar system with you when you travel.   Plan ahead for refills as to not run out of your medication(s).   Always carry an updated list of your medications with you. If there is an emergency, a respondent can quickly see what medications you are taking.  STORE AND DISCARD YOUR MEDICATIONS SAFELY   Store medications in   medications.  Keep medications out of children's reach, away from counters and bedside tables. Store them up high in cabinets or shelves.  Check expiration dates regularly.  Learn about the best way to dispose of each medication you take. Find out if your local government recycling program has a Medicine Take Back  program for safe disposal. If not, some medications may be mixed with inedible substances and thrown away in the trash. Certain medications are to be flushed down the toilet. REMEMBER:  Tell your caregiver if you experience side effects, new symptoms, or have other concerns. There may be dosing changes or alternative medications that would be better for you.  Review your medications regularly with your caregiver. Check to see if you need to continue to take each medication, and discuss how well they are working. Medicines, diet, medical conditions, weight changes, and other habits can all affect how medicines work. PEDIATRIC CONSIDERATIONS If you are taking care of an infant or child who needs multiple medications, follow the tips above to organize a medication schedule and safely give and store medications.   Use positive reinforcement (singing, cuddling, reward) for your child to help him or her take necessary medications.  Use only syringes, droppers, dosing spoons, or dosing cups provided by your caregiver or pharmacist.  Always wash your hands before giving medications.  Get to know your child's school medication policies. Meet with the school nurse to review the medication schedule in detail. Never send the medication to school with your child.  Check with your child's caregiver if he or she has trouble taking medication, forgets a dose, or spits it up.  Make sure your child knows how to use an inhaler properly if needed.  Do not give your child over-the-counter cough and cold medicines if they are under 73 years of age.  Avoid giving your child or teenager Aspirin or Aspirin-containing products. Document Released: 12/26/2010 Document Revised: 12/03/2011 Document Reviewed: 12/26/2010 [facility] Patient Information 2015 [place], [place]. This information is not intended to replace advice given to you by your health care provider. Make sure you discuss any questions you have with your  health care provider.  Fall Prevention and Home Safety Falls cause injuries and can affect all age groups. It is possible to prevent falls.  HOW TO PREVENT FALLS  Wear shoes with rubber soles that do not have an opening for your toes.  Keep the inside and outside of your house well lit.  Use night lights throughout your home.  Remove clutter from floors.  Clean up floor spills.  Remove throw rugs or fasten them to the floor with carpet tape.  Do not place electrical cords across pathways.  Put grab bars by your tub, shower, and toilet. Do not use towel bars as grab bars.  Put handrails on both sides of the stairway. Fix loose handrails.  Do not climb on stools or stepladders, if possible.  Do not wax your floors.  Repair uneven or unsafe sidewalks, walkways, or stairs.  Keep items you use a lot within reach.  Be aware of pets.  Keep emergency numbers next to the telephone.  Put smoke detectors in your home and near bedrooms. Ask your doctor what other things you can do to prevent falls. Document Released: 07/07/2009 Document Revised: 03/11/2012 Document Reviewed: 12/11/2011 [facility] Patient Information 2015 [place], [place]. This information is not intended to replace advice given to you by your health care provider. Make sure you discuss any questions you have with your health  care provider.  Make appointment with [facility] for PCP establishment. Take medications only as prescribed. Ambulate with walker only. Return to the emergency department if you experience confusion, dizziness, blurry vision, headache, difficulty breathing, chest pain.

## 2015-06-28 ENCOUNTER — Emergency Department (HOSPITAL_COMMUNITY): Admission: EM | Admit: 2015-06-28 | Discharge: 2015-06-28 | Discharge status: Absent without leave | Payer: Self-pay

## 2015-07-03 ENCOUNTER — Emergency Department (HOSPITAL_COMMUNITY)
Admission: EM | Admit: 2015-07-03 | Discharge: 2015-07-04 | Disposition: A | Discharge status: Home / Self Care | Payer: Medicare Other | Attending: Emergency Medicine | Admitting: Emergency Medicine

## 2015-07-03 ENCOUNTER — Encounter (HOSPITAL_COMMUNITY): Payer: Self-pay | Admitting: Emergency Medicine

## 2015-07-03 DIAGNOSIS — Z7901 Long term (current) use of anticoagulants: Secondary | ICD-10-CM | POA: Insufficient documentation

## 2015-07-03 DIAGNOSIS — I10 Essential (primary) hypertension: Secondary | ICD-10-CM | POA: Diagnosis not present

## 2015-07-03 DIAGNOSIS — Z862 Personal history of diseases of the blood and blood-forming organs and certain disorders involving the immune mechanism: Secondary | ICD-10-CM | POA: Diagnosis not present

## 2015-07-03 DIAGNOSIS — J45909 Unspecified asthma, uncomplicated: Secondary | ICD-10-CM | POA: Insufficient documentation

## 2015-07-03 DIAGNOSIS — F419 Anxiety disorder, unspecified: Secondary | ICD-10-CM | POA: Insufficient documentation

## 2015-07-03 DIAGNOSIS — Z872 Personal history of diseases of the skin and subcutaneous tissue: Secondary | ICD-10-CM | POA: Insufficient documentation

## 2015-07-03 DIAGNOSIS — Z79899 Other long term (current) drug therapy: Secondary | ICD-10-CM | POA: Insufficient documentation

## 2015-07-03 DIAGNOSIS — K219 Gastro-esophageal reflux disease without esophagitis: Secondary | ICD-10-CM | POA: Diagnosis not present

## 2015-07-03 DIAGNOSIS — G4730 Sleep apnea, unspecified: Secondary | ICD-10-CM | POA: Insufficient documentation

## 2015-07-03 DIAGNOSIS — F209 Schizophrenia, unspecified: Secondary | ICD-10-CM | POA: Diagnosis not present

## 2015-07-03 DIAGNOSIS — M1990 Unspecified osteoarthritis, unspecified site: Secondary | ICD-10-CM | POA: Insufficient documentation

## 2015-07-03 DIAGNOSIS — Z8701 Personal history of pneumonia (recurrent): Secondary | ICD-10-CM | POA: Diagnosis not present

## 2015-07-03 DIAGNOSIS — Z86718 Personal history of other venous thrombosis and embolism: Secondary | ICD-10-CM | POA: Insufficient documentation

## 2015-07-03 DIAGNOSIS — Z86711 Personal history of pulmonary embolism: Secondary | ICD-10-CM | POA: Diagnosis not present

## 2015-07-03 DIAGNOSIS — M549 Dorsalgia, unspecified: Secondary | ICD-10-CM | POA: Diagnosis present

## 2015-07-03 DIAGNOSIS — R011 Cardiac murmur, unspecified: Secondary | ICD-10-CM | POA: Insufficient documentation

## 2015-07-03 DIAGNOSIS — F329 Major depressive disorder, single episode, unspecified: Secondary | ICD-10-CM | POA: Insufficient documentation

## 2015-07-03 DIAGNOSIS — R296 Repeated falls: Secondary | ICD-10-CM | POA: Diagnosis not present

## 2015-07-03 DIAGNOSIS — Z9981 Dependence on supplemental oxygen: Secondary | ICD-10-CM | POA: Diagnosis not present

## 2015-07-03 DIAGNOSIS — G8929 Other chronic pain: Secondary | ICD-10-CM | POA: Insufficient documentation

## 2015-07-03 MED ORDER — KETOROLAC TROMETHAMINE 60 MG/2ML IM SOLN
60.0000 mg | Freq: Once | INTRAMUSCULAR | Status: AC
  Administered 2015-07-03: 60 mg via INTRAMUSCULAR
  Filled 2015-07-03: qty 2

## 2015-07-03 NOTE — ED Provider Notes (Signed)
CSN: [id number]     Arrival date & time 07/03/15  1909 History  By signing my name below, I, [name] [name], attest that this documentation has been prepared under the direction and in the presence of Dyke Weible, MD. Electronically Signed: Ronney [name], ED Scribe. 07/03/2015. 11:59 PM.  Chief Complaint  Patient presents with  . Fall  . Back Pain  . Arthritis   Patient is a 74 y.o. female presenting with back pain. The history is provided by the patient. No language interpreter was used.  Back Pain Location:  Generalized Quality:  Aching Radiates to:  Does not radiate Pain severity:  Moderate Pain is:  Same all the time Onset quality:  Sudden Duration:  2 weeks Timing:  Constant Chronicity:  Chronic Context: falling   Relieved by: gapabentin, topical analgesics. Worsened by:  Nothing tried Ineffective treatments:  None tried Associated symptoms: no fever, no numbness and no weakness     HPI Comments: [name] [name] is a 74 y.o. female with a history of chronic pain, arthritis, depression, anxiety, and schizophrenia, who presents to the Emergency Department complaining of back pain due to frequent falls over last week. She notes a bruise to her left upper leg. Patient has been evaluated for this immediately afterwards. She reports she had CT scans done at [facility] and [facility] as well that patient reports were all negative. She requests pain medication until she can follow up with her PCP. She states she normally applies a topical analgesic for her aches and pains, but when the pain gets too severe, she comes to the ED for injections. Patient states she normally takes gabapentin for her pain, which provides mild relief.Per chart review, patient admits to a history of narcotic abuse. She also notes a history of right knee arthroplasty.    Past Medical History  Diagnosis Date  . Depression   . Schizophrenia (HCC)   . Hyperlipidemia   . Hypertension   . Chronic pain      "over my whole body" (03/30/2013)  . Pulmonary embolism (HCC) 12/2009    Large central bilateral PE's   . DVT (deep venous thrombosis) (HCC)     per 01/17/10 d/c summary- "remote hx of dvt"?  . Iron deficiency anemia   . Glaucoma   . Hemorrhoids   . Asthma   . Anxiety   . GERD (gastroesophageal reflux disease)   . Psoriasis 04/29/2012    New onset evaluated by Dr Marylou Flesher, Dermatology, [facility] 6/13  Rx 0.1% Tacrolimus ointment  . Complication of anesthesia     "because I have sleep apnea" (03/30/2013)  . Heart murmur   . Chest pain, exertional   . Chronic bronchitis (HCC)   . Sleep apnea     "waiting on my CPAP" (03/30/2013)  . Pneumonia     "a few times" (03/30/2013)  . Exertional shortness of breath     "& sometimes when laying down" (03/30/2013)  . Daily headache     "last 2 months" (03/30/2013)  . Arthritis     "joints" (03/30/2013)  . Varicose veins of legs   . Psoriasis    Past Surgical History  Procedure Laterality Date  . Spinal fusion      2004  . Carpal tunnel release Bilateral   . Tubal ligation  1973  . Dilation and curettage of uterus  1970's    "once" (03/30/2013)  . 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 Joint replacement    .  Vein surgery  left leg   Family History  Problem Relation Age of Onset  . Diabetes Sister   . Colon cancer Brother 40  . Colon cancer Brother    Social History  Substance Use Topics  . Smoking status: Never Smoker   . Smokeless tobacco: Never Used  . Alcohol Use: No   OB History    No data available     Review of Systems  Constitutional: Negative for fever.  Musculoskeletal: Positive for back pain.  Skin: Positive for color change (bruise).  Neurological: Negative for weakness and numbness.  All other systems reviewed and are negative.  Allergies  Methadone and Codeine  Home Medications   Prior to Admission medications   Medication Sig Start Date End Date Taking? Authorizing Provider  albuterol (PROVENTIL  HFA;VENTOLIN HFA) 108 (90 BASE) MCG/ACT inhaler Inhale 2 puffs into the lungs every 6 (six) hours as needed for wheezing or shortness of breath (wheezing). 12/08/14  Yes Tyrone Nine, MD  amLODipine (NORVASC) 10 MG tablet Take 10 mg by mouth daily. 04/18/15  Yes Historical Provider, MD  atenolol (TENORMIN) 100 MG tablet TAKE 1 TABLET BY MOUTH EVERY DAY 02/20/15  Yes Tyrone Nine, MD  esomeprazole (NEXIUM) 40 MG capsule Take 1 capsule (40 mg total) by mouth daily. 06/18/15  Yes Mady Gemma, PA-C  FLUoxetine (PROZAC) 20 MG capsule Take 3 capsules (60 mg total) by mouth daily. Patient taking differently: Take 20 mg by mouth daily.  05/26/14  Yes Tyrone Nine, MD  furosemide (LASIX) 20 MG tablet Take 1 tablet (20 mg total) by mouth daily. 10/29/14  Yes Tyrone Nine, MD  gabapentin (NEURONTIN) 300 MG capsule Take 300 mg by mouth 3 (three) times daily.  05/07/15  Yes Historical Provider, MD  HYDROXYZINE PAMOATE PO Take 25 mg by mouth daily as needed. For anxiety   Yes Historical Provider, MD  lisinopril (PRINIVIL,ZESTRIL) 20 MG tablet Take 1 tablet (20 mg total) by mouth daily. 12/08/14  Yes Tyrone Nine, MD  meclizine (ANTIVERT) 25 MG tablet Take 1 tablet (25 mg total) by mouth 3 (three) times daily as needed for dizziness. 02/01/15  Yes Arby Barrette, MD  warfarin (COUMADIN) 5 MG tablet Take 5 mg by mouth daily at 6 PM.   Yes Historical Provider, MD  gabapentin (NEURONTIN) 400 MG capsule Take 1 capsule (400 mg total) by mouth 3 (three) times daily. Patient not taking: Reported on 07/03/2015 12/08/14   Tyrone Nine, MD  hydrOXYzine (ATARAX/VISTARIL) 10 MG tablet Take 1 tablet (10 mg total) by mouth every 6 (six) hours as needed for itching (itching). Patient not taking: Reported on 06/18/2015 05/11/15   Paula Libra, MD  potassium chloride SA (K-DUR,KLOR-CON) 20 MEQ tablet Take 20 mEq by mouth daily.    Historical Provider, MD  silver sulfADIAZINE (SILVADENE) 1 % cream APPLY TOPICALLY EVERY DAY Patient not  taking: Reported on 07/03/2015 12/10/14   Tyrone Nine, MD  traMADol (ULTRAM) 50 MG tablet Take 1 tablet (50 mg total) by mouth every 6 (six) hours as needed. Patient taking differently: Take 50 mg by mouth every 6 (six) hours as needed for moderate pain.  06/12/15   Emilia Beck, PA-C  triamcinolone cream (KENALOG) 0.1 % Apply 1 application topically 2 (two) times daily as needed (for legs). Patient not taking: Reported on 06/18/2015 10/29/14   Tyrone Nine, MD  warfarin (COUMADIN) 5 MG tablet TAKE 1 TABLET BY MOUTH EVERY DAY Patient not taking:  Reported on 07/03/2015 10/18/14   Levert Feinstein, MD   BP 158/82 mmHg  Pulse 65  Temp(Src) 98.2 F (36.8 C) (Oral)  Resp 18  SpO2 92%  LMP 11/22/1996 Physical Exam  Constitutional: She is oriented to person, place, and time. She appears well-developed and well-nourished. No distress.  HENT:  Head: Normocephalic and atraumatic.  Mouth/Throat: Oropharynx is clear and moist. No oropharyngeal exudate.  Moist mm. No Battle's sign or raccoon eyes.   Eyes: EOM are normal. Pupils are equal, round, and reactive to light.  Neck: Normal range of motion. Neck supple.  Cardiovascular: Normal rate, regular rhythm and normal heart sounds.   Pulmonary/Chest: Effort normal and breath sounds normal. No respiratory distress. She has no wheezes. She has no rales.  Abdominal: Soft. Bowel sounds are normal. There is no tenderness. There is no rebound and no guarding.  Constipation in transverse and descending.  Musculoskeletal: Normal range of motion. She exhibits no edema or tenderness.  Negative anterior and posterior drawer test. Negative valgus and varus. Achilles' tendon intact.Intact patellar and quadriceps tendon. No laxity. No patellar alta or baja. DP and PT pulses intact.  Neurological: She is alert and oriented to person, place, and time. She has normal reflexes. No cranial nerve deficit.  Skin: Skin is warm and dry.  Ecchymosis that is 5 cm over the  hip; already healing - green and yellow.   Psychiatric: She has a normal mood and affect.  Nursing note and vitals reviewed.   ED Course  Procedures (including critical care time)  DIAGNOSTIC STUDIES: Oxygen Saturation is 92% RA, adequate by my interpretation.    COORDINATION OF CARE: 11:07 PM - Discussed treatment plan with pt at bedside which includes analgesic injection. Pt verbalized understanding and agreed to plan.   MDM   Final diagnoses:  None   Patient is by her own accounts here for chronic unchanges pain that she has not followed up with a PMD nor pain management for.  She has been seen in many of the local healthcare systems for same recently.  Recently seen in the [facility] system for somnolence secondary to polypharmacy.  Based on this information and [employer] policy on treating chronic pain EDP ordered a shot of toradol for the patient's pain.  Nurse came to EDP stating patient is demanding a specific dosage of morphine.  I do not believe this is in the patient's best interest.  Please follow up with your PMD and pain management for ongoing care of your chronic pain.    I, Cabella Kimm-RASCH,Lynnie Koehler K, personally performed the services described in this documentation. All medical record entries made by the scribe were at my direction and in my presence.  I have reviewed the chart and discharge instructions and agree that the record reflects my personal performance and is accurate and complete. Keiaira Donlan-RASCH,Sonny Anthes K.  07/04/2015. 1:05 AM.        Anas Reister, MD 07/04/15 0981

## 2015-07-03 NOTE — ED Notes (Signed)
Pt states she's been falling more frequently, "I had 6 falls last week," saying that her back is hurting now. Asking for more pain medication to get her through until she gets a primary MD. Complaining about having to care for her son, being more stressed recently, and states she's becoming more forgetful over the past few months.

## 2015-07-04 ENCOUNTER — Encounter (HOSPITAL_COMMUNITY): Payer: Self-pay | Admitting: Emergency Medicine

## 2015-07-04 NOTE — ED Notes (Signed)
Pt is verbally aggressive d/t not receiving narcotic pain medication or a rx for narcotic pain medication.  Pt ripped d/c papers up and threw them at this writer.

## 2015-07-04 NOTE — ED Notes (Signed)
Pt is requesting morphine for her pain.  This Clinical research associate informed pt that I would ask the doctor and pt replied, "She better give it to me or we is gonna rumble."  Dr. Nicanor Alcon is aware.

## 2015-07-04 NOTE — Discharge Instructions (Signed)
Authorized Agent Controlled Analgesia °Authorized agent controlled analgesia (AACA) is when a family member or someone who cares for an individual gives pain relieving medication (analgesia). The pain medication is given through a device called a patient controlled analgesia (PCA) pump. When a person cannot administer the analgesia through the PCA pump or does not understand when to give analgesia, another person is given this responsibility. They are referred to as an authorized agent. The authorized agent needs to be approved, trained, and responsible to administer a person's analgesia through a PCA pump. One authorized agent will be allowed to dose the patient during a specified time frame. Other family members or visitors should not administer analgesia. °AACA ANALGESIA DOSING °Together, you and the nurse will select a pain scale to use as well as a comfort goal. This may be a number scale or may be based on behaviors of your loved one. The goal of pain management is to make your loved one as comfortable as possible and to reduce pain without making them too sleepy. Reduced pain, not total relief, is a realistic plan. An analgesia dose should not be given if your loved one is sleeping or for reasons other than pain relief, unless directed otherwise.  °When your loved one has pain: °· Activate the delivery of an analgesia dose as instructed. °· Listen for an audible tone. This tone lets you know that the pump received the signal to give the analgesia. The analgesia is delivered through the attached intravenous (IV) tubing and into your loved one. °Doses of analgesia should be attempted anytime your loved one is in pain and awake. However, your loved one may not get a dose of analgesia every time you activate dose delivery. The pump is programmed to a specific time interval between doses. This safety feature attempts to prevent overdosing.  °Nurses will continue to monitor your loved one for pain relief, side  effects, and complications during AACA. Drowsiness, sedation, dizziness, nausea, vomiting, and constipation are some of the side effects of analgesia. Complications of analgesia include slowed breathing, tolerance, and dependence. °Use of analgesics may raise worries about possible dependence or addiction. The short-term use of analgesics will be managed carefully to make sure the chance of dependence is as low as possible. Most patients progress to oral pain relieving medications within a couple days, and the use of AACA is discontinued. °BENEFITS OF AACA  °· Manages pain effectively. °· Minimizes the delay in the patient getting the pain medication they need. °· Reduces number of injections for pain relief. °· Promotes shorter hospital stays. Patients with good pain control get better faster and usually leave the hospital earlier. °SEEK MEDICAL CARE IF: °· You have questions or concerns about AACA. °· There is swelling or leaking at the IV insertion site. °· The skin near the IV insertion site is cool to the touch. °· Your loved one has pain that does not get better with the analgesia. °· Your loved one is dizzy. °· Your loved one has nausea or is vomiting. °· Your loved one is constipated. °SEEK IMMEDIATE MEDICAL CARE IF: °· Your loved one, who was previously awake, cannot be aroused. °· Your loved one is breathing slowly. °· Your loved one is itching. °  °This information is not intended to replace advice given to you by your health care provider. Make sure you discuss any questions you have with your health care provider. °  °Document Released: 12/26/2010 Document Revised: 12/03/2011 Document Reviewed: 12/26/2010 °Elsevier Interactive Patient Education ©2016 [employer]   Inc. ° °

## 2015-07-07 ENCOUNTER — Other Ambulatory Visit: Payer: Self-pay | Admitting: Family Medicine

## 2015-07-08 ENCOUNTER — Telehealth: Payer: Self-pay | Admitting: Pharmacist

## 2015-07-08 NOTE — Telephone Encounter (Signed)
Patient's home phone is no longer in service. Left VM on patient's mobile phone. Reminded her that Coumadin clinic closed permanently on 06/24/15. She has been informed of this multiple times by telephone over the last 2 months. It is now her responsibility to follow up with a provider to manage her Coumadin. She can make an appt to see Dr. Cyndie ChimeGranfortuna [facility] or have her INR managed by her PCP. She has FTKA with both of those offices and the Coumadin clinic before it closed. She has historically been noncompliant with her Coumadin.

## 2015-07-13 ENCOUNTER — Ambulatory Visit: Payer: Medicare Other | Admitting: Podiatry

## 2015-07-21 ENCOUNTER — Emergency Department (HOSPITAL_COMMUNITY)
Admission: EM | Admit: 2015-07-21 | Discharge: 2015-07-21 | Disposition: A | Discharge status: Home / Self Care | Payer: Medicare Other | Attending: Emergency Medicine | Admitting: Emergency Medicine

## 2015-07-21 ENCOUNTER — Encounter (HOSPITAL_COMMUNITY): Payer: Self-pay | Admitting: Emergency Medicine

## 2015-07-21 DIAGNOSIS — Z8701 Personal history of pneumonia (recurrent): Secondary | ICD-10-CM | POA: Insufficient documentation

## 2015-07-21 DIAGNOSIS — F329 Major depressive disorder, single episode, unspecified: Secondary | ICD-10-CM | POA: Insufficient documentation

## 2015-07-21 DIAGNOSIS — R011 Cardiac murmur, unspecified: Secondary | ICD-10-CM | POA: Diagnosis not present

## 2015-07-21 DIAGNOSIS — Z7901 Long term (current) use of anticoagulants: Secondary | ICD-10-CM | POA: Insufficient documentation

## 2015-07-21 DIAGNOSIS — K219 Gastro-esophageal reflux disease without esophagitis: Secondary | ICD-10-CM | POA: Insufficient documentation

## 2015-07-21 DIAGNOSIS — J45909 Unspecified asthma, uncomplicated: Secondary | ICD-10-CM | POA: Insufficient documentation

## 2015-07-21 DIAGNOSIS — M542 Cervicalgia: Secondary | ICD-10-CM | POA: Diagnosis not present

## 2015-07-21 DIAGNOSIS — M546 Pain in thoracic spine: Secondary | ICD-10-CM | POA: Diagnosis not present

## 2015-07-21 DIAGNOSIS — M25559 Pain in unspecified hip: Secondary | ICD-10-CM | POA: Insufficient documentation

## 2015-07-21 DIAGNOSIS — F209 Schizophrenia, unspecified: Secondary | ICD-10-CM | POA: Diagnosis not present

## 2015-07-21 DIAGNOSIS — Z86711 Personal history of pulmonary embolism: Secondary | ICD-10-CM | POA: Insufficient documentation

## 2015-07-21 DIAGNOSIS — Z872 Personal history of diseases of the skin and subcutaneous tissue: Secondary | ICD-10-CM | POA: Diagnosis not present

## 2015-07-21 DIAGNOSIS — G4730 Sleep apnea, unspecified: Secondary | ICD-10-CM | POA: Diagnosis not present

## 2015-07-21 DIAGNOSIS — R202 Paresthesia of skin: Secondary | ICD-10-CM | POA: Insufficient documentation

## 2015-07-21 DIAGNOSIS — I10 Essential (primary) hypertension: Secondary | ICD-10-CM | POA: Diagnosis not present

## 2015-07-21 DIAGNOSIS — G8929 Other chronic pain: Secondary | ICD-10-CM | POA: Diagnosis not present

## 2015-07-21 DIAGNOSIS — Z86718 Personal history of other venous thrombosis and embolism: Secondary | ICD-10-CM | POA: Diagnosis not present

## 2015-07-21 DIAGNOSIS — Z79899 Other long term (current) drug therapy: Secondary | ICD-10-CM | POA: Insufficient documentation

## 2015-07-21 DIAGNOSIS — Z9981 Dependence on supplemental oxygen: Secondary | ICD-10-CM | POA: Insufficient documentation

## 2015-07-21 DIAGNOSIS — M545 Low back pain: Secondary | ICD-10-CM | POA: Diagnosis not present

## 2015-07-21 DIAGNOSIS — Z8639 Personal history of other endocrine, nutritional and metabolic disease: Secondary | ICD-10-CM | POA: Insufficient documentation

## 2015-07-21 DIAGNOSIS — Z862 Personal history of diseases of the blood and blood-forming organs and certain disorders involving the immune mechanism: Secondary | ICD-10-CM | POA: Diagnosis not present

## 2015-07-21 DIAGNOSIS — F419 Anxiety disorder, unspecified: Secondary | ICD-10-CM | POA: Insufficient documentation

## 2015-07-21 MED ORDER — KETOROLAC TROMETHAMINE 60 MG/2ML IM SOLN
30.0000 mg | Freq: Once | INTRAMUSCULAR | Status: AC
  Administered 2015-07-21: 30 mg via INTRAMUSCULAR
  Filled 2015-07-21: qty 2

## 2015-07-21 NOTE — ED Notes (Signed)
Pt refused to E sign at discharge. Stated "I did not get what I wanted and I'm not satisfied so I would rather not sign." Pt was given phone to call a ride and wheeled to the lobby to wait on her ride.

## 2015-07-21 NOTE — Discharge Instructions (Signed)
Use your home pain medications as directed by your regular doctor. Use tylenol/motrin as needed for additional relief. Soak in a warm epsom salt bath to help with pain. Follow up with your regular doctor/pain clinic for ongoing management of your chronic pain. Return to the ER for changes or worsening symptoms.   Chronic Pain Chronic pain can be defined as pain that is off and on and lasts for 3-6 months or longer. Many things cause chronic pain, which can make it difficult to make a diagnosis. There are many treatment options available for chronic pain. However, finding a treatment that works well for you may require trying various approaches until the right one is found. Many people benefit from a combination of two or more types of treatment to control their pain. SYMPTOMS  Chronic pain can occur anywhere in the body and can range from mild to very severe. Some types of chronic pain include:  Headache.  Low back pain.  Cancer pain.  Arthritis pain.  Neurogenic pain. This is pain resulting from damage to nerves. People with chronic pain may also have other symptoms such as:  Depression.  Anger.  Insomnia.  Anxiety. DIAGNOSIS  Your health care provider will help diagnose your condition over time. In many cases, the initial focus will be on excluding possible conditions that could be causing the pain. Depending on your symptoms, your health care provider may order tests to diagnose your condition. Some of these tests may include:   Blood tests.   CT scan.   MRI.   X-rays.   Ultrasounds.   Nerve conduction studies.  You may need to see a specialist.  TREATMENT  Finding treatment that works well may take time. You may be referred to a pain specialist. He or she may prescribe medicine or therapies, such as:   Mindful meditation or yoga.  Shots (injections) of numbing or pain-relieving medicines into the spine or area of pain.  Local electrical  stimulation.  Acupuncture.   Massage therapy.   Aroma, color, light, or sound therapy.   Biofeedback.   Working with a physical therapist to keep from getting stiff.   Regular, gentle exercise.   Cognitive or behavioral therapy.   Group support.  Sometimes, surgery may be recommended.  HOME CARE INSTRUCTIONS   Take all medicines as directed by your health care provider.   Lessen stress in your life by relaxing and doing things such as listening to calming music.   Exercise or be active as directed by your health care provider.   Eat a healthy diet and include things such as vegetables, fruits, fish, and lean meats in your diet.   Keep all follow-up appointments with your health care provider.   Attend a support group with others suffering from chronic pain. SEEK MEDICAL CARE IF:   Your pain gets worse.   You develop a new pain that was not there before.   You cannot tolerate medicines given to you by your health care provider.   You have new symptoms since your last visit with your health care provider.  SEEK IMMEDIATE MEDICAL CARE IF:   You feel weak.   You have decreased sensation or numbness.   You lose control of bowel or bladder function.   Your pain suddenly gets much worse.   You develop shaking.  You develop chills.  You develop confusion.  You develop chest pain.  You develop shortness of breath.  MAKE SURE YOU:  Understand these instructions.  Will  watch your condition.  Will get help right away if you are not doing well or get worse.   This information is not intended to replace advice given to you by your health care provider. Make sure you discuss any questions you have with your health care provider.   Document Released: 06/02/2002 Document Revised: 05/13/2013 Document Reviewed: 03/06/2013 Elsevier Interactive Patient Education 2016 [employer].  Hypertension Hypertension, commonly called high blood  pressure, is when the force of blood pumping through your arteries is too strong. Your arteries are the blood vessels that carry blood from your heart throughout your body. A blood pressure reading consists of a higher number over a lower number, such as 110/72. The higher number (systolic) is the pressure inside your arteries when your heart pumps. The lower number (diastolic) is the pressure inside your arteries when your heart relaxes. Ideally you want your blood pressure below 120/80. Hypertension forces your heart to work harder to pump blood. Your arteries may become narrow or stiff. Having untreated or uncontrolled hypertension can cause heart attack, stroke, kidney disease, and other problems. RISK FACTORS Some risk factors for high blood pressure are controllable. Others are not.  Risk factors you cannot control include:   Race. You may be at higher risk if you are African American.  Age. Risk increases with age.  Gender. Men are at higher risk than women before age 77 years. After age 46, women are at higher risk than men. Risk factors you can control include:  Not getting enough exercise or physical activity.  Being overweight.  Getting too much fat, sugar, calories, or salt in your diet.  Drinking too much alcohol. SIGNS AND SYMPTOMS Hypertension does not usually cause signs or symptoms. Extremely high blood pressure (hypertensive crisis) may cause headache, anxiety, shortness of breath, and nosebleed. DIAGNOSIS To check if you have hypertension, your health care provider will measure your blood pressure while you are seated, with your arm held at the level of your heart. It should be measured at least twice using the same arm. Certain conditions can cause a difference in blood pressure between your right and left arms. A blood pressure reading that is higher than normal on one occasion does not mean that you need treatment. If it is not clear whether you have high blood pressure,  you may be asked to return on a different day to have your blood pressure checked again. Or, you may be asked to monitor your blood pressure at home for 1 or more weeks. TREATMENT Treating high blood pressure includes making lifestyle changes and possibly taking medicine. Living a healthy lifestyle can help lower high blood pressure. You may need to change some of your habits. Lifestyle changes may include:  Following the DASH diet. This diet is high in fruits, vegetables, and whole grains. It is low in salt, red meat, and added sugars.  Keep your sodium intake below 2,300 mg per day.  Getting at least 30-45 minutes of aerobic exercise at least 4 times per week.  Losing weight if necessary.  Not smoking.  Limiting alcoholic beverages.  Learning ways to reduce stress. Your health care provider may prescribe medicine if lifestyle changes are not enough to get your blood pressure under control, and if one of the following is true:  You are 87-71 years of age and your systolic blood pressure is above 140.  You are 92 years of age or older, and your systolic blood pressure is above 150.  Your diastolic  blood pressure is above 90.  You have diabetes, and your systolic blood pressure is over 140 or your diastolic blood pressure is over 90.  You have kidney disease and your blood pressure is above 140/90.  You have heart disease and your blood pressure is above 140/90. Your personal target blood pressure may vary depending on your medical conditions, your age, and other factors. HOME CARE INSTRUCTIONS  Have your blood pressure rechecked as directed by your health care provider.   Take medicines only as directed by your health care provider. Follow the directions carefully. Blood pressure medicines must be taken as prescribed. The medicine does not work as well when you skip doses. Skipping doses also puts you at risk for problems.  Do not smoke.   Monitor your blood pressure at home  as directed by your health care provider. SEEK MEDICAL CARE IF:   You think you are having a reaction to medicines taken.  You have recurrent headaches or feel dizzy.  You have swelling in your ankles.  You have trouble with your vision. SEEK IMMEDIATE MEDICAL CARE IF:  You develop a severe headache or confusion.  You have unusual weakness, numbness, or feel faint.  You have severe chest or abdominal pain.  You vomit repeatedly.  You have trouble breathing. MAKE SURE YOU:   Understand these instructions.  Will watch your condition.  Will get help right away if you are not doing well or get worse.   This information is not intended to replace advice given to you by your health care provider. Make sure you discuss any questions you have with your health care provider.   Document Released: 09/10/2005 Document Revised: 01/25/2015 Document Reviewed: 07/03/2013 Elsevier Interactive Patient Education 2016 [employer] Inc[employer]  DASH Eating Plan DASH stands for "Dietary Approaches to Stop Hypertension." The DASH eating plan is a healthy eating plan that has been shown to reduce high blood pressure (hypertension). Additional health benefits may include reducing the risk of type 2 diabetes mellitus, heart disease, and stroke. The DASH eating plan may also help with weight loss. WHAT DO I NEED TO KNOW ABOUT THE DASH EATING PLAN? For the DASH eating plan, you will follow these general guidelines:  Choose foods with a percent daily value for sodium of less than 5% (as listed on the food label).  Use salt-free seasonings or herbs instead of table salt or sea salt.  Check with your health care provider or pharmacist before using salt substitutes.  Eat lower-sodium products, often labeled as "lower sodium" or "no salt added."  Eat fresh foods.  Eat more vegetables, fruits, and low-fat dairy products.  Choose whole grains. Look for the word "whole" as the first word in the ingredient  list.  Choose fish and skinless chicken or [place] more often than red meat. Limit fish, poultry, and meat to 6 oz (170 g) each day.  Limit sweets, desserts, sugars, and sugary drinks.  Choose heart-healthy fats.  Limit cheese to 1 oz (28 g) per day.  Eat more home-cooked food and less restaurant, buffet, and fast food.  Limit fried foods.  Cook foods using methods other than frying.  Limit canned vegetables. If you do use them, rinse them well to decrease the sodium.  When eating at a restaurant, ask that your food be prepared with less salt, or no salt if possible. WHAT FOODS CAN I EAT? Seek help from a dietitian for individual calorie needs. Grains Whole grain or whole wheat bread. Brown rice.  Whole grain or whole wheat pasta. Quinoa, bulgur, and whole grain cereals. Low-sodium cereals. Corn or whole wheat flour tortillas. Whole grain cornbread. Whole grain crackers. Low-sodium crackers. Vegetables Fresh or frozen vegetables (raw, steamed, roasted, or grilled). Low-sodium or reduced-sodium tomato and vegetable juices. Low-sodium or reduced-sodium tomato sauce and paste. Low-sodium or reduced-sodium canned vegetables.  Fruits All fresh, canned (in natural juice), or frozen fruits. Meat and Other Protein Products Ground beef (85% or leaner), grass-fed beef, or beef trimmed of fat. Skinless chicken or [place]. Ground chicken or [place]. Pork trimmed of fat. All fish and seafood. Eggs. Dried beans, peas, or lentils. Unsalted nuts and seeds. Unsalted canned beans. Dairy Low-fat dairy products, such as skim or 1% milk, 2% or reduced-fat cheeses, low-fat ricotta or cottage cheese, or plain low-fat yogurt. Low-sodium or reduced-sodium cheeses. Fats and Oils Tub margarines without trans fats. Light or reduced-fat mayonnaise and salad dressings (reduced sodium). Avocado. Safflower, olive, or canola oils. Natural peanut or almond butter. Other Unsalted popcorn and pretzels. The items listed  above may not be a complete list of recommended foods or beverages. Contact your dietitian for more options. WHAT FOODS ARE NOT RECOMMENDED? Grains White bread. White pasta. White rice. Refined cornbread. Bagels and croissants. Crackers that contain trans fat. Vegetables Creamed or fried vegetables. Vegetables in a cheese sauce. Regular canned vegetables. Regular canned tomato sauce and paste. Regular tomato and vegetable juices. Fruits Dried fruits. Canned fruit in light or heavy syrup. Fruit juice. Meat and Other Protein Products Fatty cuts of meat. Ribs, chicken wings, bacon, sausage, bologna, salami, chitterlings, fatback, hot dogs, bratwurst, and packaged luncheon meats. Salted nuts and seeds. Canned beans with salt. Dairy Whole or 2% milk, cream, half-and-half, and cream cheese. Whole-fat or sweetened yogurt. Full-fat cheeses or blue cheese. Nondairy creamers and whipped toppings. Processed cheese, cheese spreads, or cheese curds. Condiments Onion and garlic salt, seasoned salt, table salt, and sea salt. Canned and packaged gravies. Worcestershire sauce. Tartar sauce. Barbecue sauce. Teriyaki sauce. Soy sauce, including reduced sodium. Steak sauce. Fish sauce. Oyster sauce. Cocktail sauce. Horseradish. Ketchup and mustard. Meat flavorings and tenderizers. Bouillon cubes. Hot sauce. Tabasco sauce. Marinades. Taco seasonings. Relishes. Fats and Oils Butter, stick margarine, lard, shortening, ghee, and bacon fat. Coconut, palm kernel, or palm oils. Regular salad dressings. Other Pickles and olives. Salted popcorn and pretzels. The items listed above may not be a complete list of foods and beverages to avoid. Contact your dietitian for more information. WHERE CAN I FIND MORE INFORMATION? National Heart, Lung, and Blood Institute: [employer].itwww.nhlbi.nih.gov/health/health-topics/topics/dash/   This information is not intended to replace advice given to you by your health care provider. Make sure you  discuss any questions you have with your health care provider.   Document Released: 08/30/2011 Document Revised: 10/01/2014 Document Reviewed: 07/15/2013 Elsevier Interactive Patient Education [employer] Inc.

## 2015-07-21 NOTE — ED Notes (Addendum)
Per [facility], chronic back and hip pain for 3 months-states ambulatory on scene-states she is out of her pain meds-patient has not taken BP meds this am

## 2015-07-21 NOTE — ED Provider Notes (Signed)
CSN: [id number]     Arrival date & time 07/21/15  4098 History   First MD Initiated Contact with Patient 07/21/15 [phone]     Chief Complaint  Patient presents with  . out of pain meds      (Consider location/radiation/quality/duration/timing/severity/associated sxs/prior Treatment) HPI Comments: [name] [name] is a 74 y.o. female with a PMHx of chronic pain, HTN, HLD, schizophrenia, depression, remote DVT/PE on coumadin, anemia, asthma, COPD, chronic headaches, and other medical hx as noted below, who presents to the ED with complaints of chronic back and hip pain and generalized body pain 3 months. She reports all of her pain is chronic and unchanged, describes as 10/10 constant aching all over her body worse with lying down and improved with gabapentin and tramadol, but states that she ran out of her tramadol 3 months ago and her primary care doctor won't fill her prescription again because they have referred her to chronic pain management that she has been unable to get an appointment. She denies any changes in her chronic pain. She also denies any fevers, chills, chest pain, shortness breath, abdominal pain, nausea, vomiting, diarrhea, constipation, dysuria, hematuria, cauda equina symptoms, numbness, or weakness. She endorses chronic tingling in her feet which is unchanged. She is here because she wants a refill of her tramadol, and a morphine shot because "this is the only thing that helps".   Of note, she hasn't taken her BP meds today.  Patient is a 74 y.o. female presenting with musculoskeletal pain. The history is provided by the patient and the EMS personnel. No language interpreter was used.  Muscle Pain This is a chronic problem. The current episode started more than 1 month ago. The problem occurs constantly. The problem has been unchanged. Associated symptoms include arthralgias (chronic unchanged) and myalgias (chronic unchanged). Pertinent negatives include no abdominal pain, chest  pain, chills, fever, nausea, numbness, urinary symptoms, vomiting or weakness. Exacerbated by: laying. Treatments tried: gabapentin and tramadol. The treatment provided moderate relief.    Past Medical History  Diagnosis Date  . Depression   . Schizophrenia (HCC)   . Hyperlipidemia   . Hypertension   . Chronic pain     "over my whole body" (03/30/2013)  . Pulmonary embolism (HCC) 12/2009    Large central bilateral PE's   . DVT (deep venous thrombosis) (HCC)     per 01/17/10 d/c summary- "remote hx of dvt"?  . Iron deficiency anemia   . Glaucoma   . Hemorrhoids   . Asthma   . Anxiety   . GERD (gastroesophageal reflux disease)   . Psoriasis 04/29/2012    New onset evaluated by Dr Marylou Flesher, Dermatology, [facility] 6/13  Rx 0.1% Tacrolimus ointment  . Complication of anesthesia     "because I have sleep apnea" (03/30/2013)  . Heart murmur   . Chest pain, exertional   . Chronic bronchitis (HCC)   . Sleep apnea     "waiting on my CPAP" (03/30/2013)  . Pneumonia     "a few times" (03/30/2013)  . Exertional shortness of breath     "& sometimes when laying down" (03/30/2013)  . Daily headache     "last 2 months" (03/30/2013)  . Arthritis     "joints" (03/30/2013)  . Varicose veins of legs   . Psoriasis    Past Surgical History  Procedure Laterality Date  . Spinal fusion      2004  . Carpal tunnel release Bilateral   . Tubal  ligation  1973  . Dilation and curettage of uterus  1970's    "once" (03/30/2013)  . 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 Joint replacement    . Vein surgery  left leg   Family History  Problem Relation Age of Onset  . Diabetes Sister   . Colon cancer Brother 40  . Colon cancer Brother    Social History  Substance Use Topics  . Smoking status: Never Smoker   . Smokeless tobacco: Never Used  . Alcohol Use: No   OB History    No data available     Review of Systems  Constitutional: Negative for fever and chills.  Respiratory: Negative for shortness  of breath.   Cardiovascular: Negative for chest pain.  Gastrointestinal: Negative for nausea, vomiting, abdominal pain, diarrhea and constipation.  Genitourinary: Negative for dysuria and hematuria.  Musculoskeletal: Positive for myalgias (chronic unchanged), back pain (chronic unchanged) and arthralgias (chronic unchanged).  Skin: Negative for color change.  Allergic/Immunologic: Negative for immunocompromised state.  Neurological: Negative for weakness and numbness.       +chronic tingling in feet  Psychiatric/Behavioral: Negative for confusion.   10 Systems reviewed and are negative for acute change except as noted in the HPI.    Allergies  Methadone and Codeine  Home Medications   Prior to Admission medications   Medication Sig Start Date End Date Taking? Authorizing Provider  albuterol (PROVENTIL HFA;VENTOLIN HFA) 108 (90 BASE) MCG/ACT inhaler Inhale 2 puffs into the lungs every 6 (six) hours as needed for wheezing or shortness of breath (wheezing). 12/08/14   Tyrone Nineyan B Grunz, MD  amLODipine (NORVASC) 10 MG tablet Take 10 mg by mouth daily. 04/18/15   Historical Provider, MD  atenolol (TENORMIN) 100 MG tablet TAKE 1 TABLET BY MOUTH EVERY DAY 02/20/15   Tyrone Nineyan B Grunz, MD  esomeprazole (NEXIUM) 40 MG capsule Take 1 capsule (40 mg total) by mouth daily. 06/18/15   Mady GemmaElizabeth C Westfall, PA-C  FLUoxetine (PROZAC) 20 MG capsule Take 3 capsules (60 mg total) by mouth daily. Patient taking differently: Take 20 mg by mouth daily.  05/26/14   Tyrone Nineyan B Grunz, MD  furosemide (LASIX) 20 MG tablet Take 1 tablet (20 mg total) by mouth daily. 10/29/14   Tyrone Nineyan B Grunz, MD  gabapentin (NEURONTIN) 300 MG capsule Take 300 mg by mouth 3 (three) times daily.  05/07/15   Historical Provider, MD  gabapentin (NEURONTIN) 400 MG capsule Take 1 capsule (400 mg total) by mouth 3 (three) times daily. Patient not taking: Reported on 07/03/2015 12/08/14   Tyrone Nineyan B Grunz, MD  hydrOXYzine (ATARAX/VISTARIL) 10 MG tablet Take 1 tablet  (10 mg total) by mouth every 6 (six) hours as needed for itching (itching). Patient not taking: Reported on 06/18/2015 05/11/15   Paula LibraJohn Molpus, MD  HYDROXYZINE PAMOATE PO Take 25 mg by mouth daily as needed. For anxiety    Historical Provider, MD  lisinopril (PRINIVIL,ZESTRIL) 20 MG tablet Take 1 tablet (20 mg total) by mouth daily. 12/08/14   Tyrone Nineyan B Grunz, MD  meclizine (ANTIVERT) 25 MG tablet Take 1 tablet (25 mg total) by mouth 3 (three) times daily as needed for dizziness. 02/01/15   Arby BarretteMarcy Pfeiffer, MD  potassium chloride SA (K-DUR,KLOR-CON) 20 MEQ tablet Take 20 mEq by mouth daily.    Historical Provider, MD  silver sulfADIAZINE (SILVADENE) 1 % cream APPLY TOPICALLY EVERY DAY Patient not taking: Reported on 07/03/2015 12/10/14   Tyrone Nineyan B Grunz, MD  traMADol (ULTRAM) 50 MG tablet  Take 1 tablet (50 mg total) by mouth every 6 (six) hours as needed. Patient taking differently: Take 50 mg by mouth every 6 (six) hours as needed for moderate pain.  06/12/15   Emilia Beck, PA-C  triamcinolone cream (KENALOG) 0.1 % Apply 1 application topically 2 (two) times daily as needed (for legs). Patient not taking: Reported on 06/18/2015 10/29/14   Tyrone Nine, MD  warfarin (COUMADIN) 5 MG tablet TAKE 1 TABLET BY MOUTH EVERY DAY Patient not taking: Reported on 07/03/2015 10/18/14   Levert Feinstein, MD  warfarin (COUMADIN) 5 MG tablet Take 5 mg by mouth daily at 6 PM.    Historical Provider, MD   BP 160/90 mmHg  Pulse 63  Temp(Src) 98.5 F (36.9 C) (Oral)  Resp 16  SpO2 97%  LMP 11/22/1996 Physical Exam  Constitutional: She is oriented to person, place, and time. Vital signs are normal. She appears well-developed and well-nourished.  Non-toxic appearance. No distress.  Afebrile, nontoxic, NAD  HENT:  Head: Normocephalic and atraumatic.  Mouth/Throat: Oropharynx is clear and moist and mucous membranes are normal.  Eyes: Conjunctivae and EOM are normal. Right eye exhibits no discharge. Left eye exhibits no  discharge.  Neck: Normal range of motion. Neck supple.  Cardiovascular: Normal rate, regular rhythm, normal heart sounds and intact distal pulses.  Exam reveals no gallop and no friction rub.   No murmur heard. Pulmonary/Chest: Effort normal and breath sounds normal. No respiratory distress. She has no decreased breath sounds. She has no wheezes. She has no rhonchi. She has no rales.  Abdominal: Soft. Normal appearance and bowel sounds are normal. She exhibits no distension. There is no tenderness. There is no rigidity, no rebound and no guarding.  Musculoskeletal: Normal range of motion.       Cervical back: She exhibits tenderness. She exhibits normal range of motion, no bony tenderness, no deformity and no spasm.       Thoracic back: She exhibits tenderness. She exhibits normal range of motion, no bony tenderness, no deformity and no spasm.       Lumbar back: She exhibits tenderness. She exhibits normal range of motion, no bony tenderness, no deformity and no spasm.  All spinal levels with FROM intact without spinous process TTP, no bony stepoffs or deformities, with diffuse paraspinous muscle TTP without muscle spasms. Strength 5/5 in all extremities, sensation grossly intact in all extremities, negative SLR bilaterally, gait steady and nonantalgic. No overlying skin changes. Diffuse muscle tenderness in all extremities.  Neurological: She is alert and oriented to person, place, and time. She has normal strength. No sensory deficit.  Skin: Skin is warm, dry and intact. No rash noted.  Psychiatric: She has a normal mood and affect.  Nursing note and vitals reviewed.   ED Course  Procedures (including critical care time) Labs Review Labs Reviewed - No data to display  Imaging Review No results found. I have personally reviewed and evaluated these images and lab results as part of my medical decision-making.   EKG Interpretation None      MDM   Final diagnoses:  Chronic pain  HTN  (hypertension), benign    74 y.o. female here for chronic pain. Ran out of tramadol 3 months ago, her PCP won't refill because they've referred her to chronic pain management clinic. She hasn't made an appt yet. No acute changes, no red flag s/s of low back pain. No s/s of central cord compression or cauda equina. Lower extremities are neurovascularly  intact and patient is ambulating without difficulty. Doubt need for acute intervention or work up. Discussed her home gabapentin and tylenol/motrin for pain, and warm epsom salt baths. Discussed that here we could offer a toradol shot, she initially declined and demanded morphine, but after long discussion, she agreed to accept toradol since morphine was not an option.  Patient urged to follow-up with PCP and chronic pain clinic for her chronic pain. Urged to return with worsening severe pain, loss of bowel or bladder control, trouble walking. The patient verbalizes understanding and agrees with the plan.   BP 160/90 mmHg  Pulse 63  Temp(Src) 98.5 F (36.9 C) (Oral)  Resp 16  SpO2 97%  LMP 11/22/1996  Meds ordered this encounter  Medications  . ketorolac (TORADOL) injection 30 mg    Sig:      Delontae Lamm Camprubi-Soms, PA-C 07/21/15 0930  Nelva Nay, MD 07/22/15 [phone]

## 2015-07-21 NOTE — ED Notes (Signed)
Bed: WA17 Expected date:  Expected time:  Means of arrival:  Comments: EMS-back and hip pain

## 2015-07-28 ENCOUNTER — Other Ambulatory Visit: Payer: Self-pay | Admitting: Family Medicine

## 2015-08-01 ENCOUNTER — Encounter (HOSPITAL_COMMUNITY): Payer: Self-pay | Admitting: Emergency Medicine

## 2015-08-01 ENCOUNTER — Emergency Department (HOSPITAL_COMMUNITY)
Admission: EM | Admit: 2015-08-01 | Discharge: 2015-08-03 | Disposition: A | Discharge status: Other Acute Inpatient Hospital | Payer: Medicare Other | Attending: Emergency Medicine | Admitting: Emergency Medicine

## 2015-08-01 DIAGNOSIS — Z862 Personal history of diseases of the blood and blood-forming organs and certain disorders involving the immune mechanism: Secondary | ICD-10-CM | POA: Diagnosis not present

## 2015-08-01 DIAGNOSIS — Z8701 Personal history of pneumonia (recurrent): Secondary | ICD-10-CM | POA: Diagnosis not present

## 2015-08-01 DIAGNOSIS — Z86718 Personal history of other venous thrombosis and embolism: Secondary | ICD-10-CM | POA: Diagnosis not present

## 2015-08-01 DIAGNOSIS — Z046 Encounter for general psychiatric examination, requested by authority: Secondary | ICD-10-CM | POA: Insufficient documentation

## 2015-08-01 DIAGNOSIS — Z8739 Personal history of other diseases of the musculoskeletal system and connective tissue: Secondary | ICD-10-CM | POA: Diagnosis not present

## 2015-08-01 DIAGNOSIS — G8929 Other chronic pain: Secondary | ICD-10-CM | POA: Diagnosis not present

## 2015-08-01 DIAGNOSIS — Z0000 Encounter for general adult medical examination without abnormal findings: Secondary | ICD-10-CM

## 2015-08-01 DIAGNOSIS — G4730 Sleep apnea, unspecified: Secondary | ICD-10-CM | POA: Insufficient documentation

## 2015-08-01 DIAGNOSIS — Z8639 Personal history of other endocrine, nutritional and metabolic disease: Secondary | ICD-10-CM | POA: Diagnosis not present

## 2015-08-01 DIAGNOSIS — F419 Anxiety disorder, unspecified: Secondary | ICD-10-CM | POA: Insufficient documentation

## 2015-08-01 DIAGNOSIS — Z7901 Long term (current) use of anticoagulants: Secondary | ICD-10-CM | POA: Diagnosis not present

## 2015-08-01 DIAGNOSIS — F329 Major depressive disorder, single episode, unspecified: Secondary | ICD-10-CM | POA: Diagnosis not present

## 2015-08-01 DIAGNOSIS — J45909 Unspecified asthma, uncomplicated: Secondary | ICD-10-CM | POA: Insufficient documentation

## 2015-08-01 DIAGNOSIS — H409 Unspecified glaucoma: Secondary | ICD-10-CM | POA: Diagnosis not present

## 2015-08-01 DIAGNOSIS — Z7951 Long term (current) use of inhaled steroids: Secondary | ICD-10-CM | POA: Diagnosis not present

## 2015-08-01 DIAGNOSIS — R011 Cardiac murmur, unspecified: Secondary | ICD-10-CM | POA: Diagnosis not present

## 2015-08-01 DIAGNOSIS — Z86711 Personal history of pulmonary embolism: Secondary | ICD-10-CM | POA: Insufficient documentation

## 2015-08-01 DIAGNOSIS — Z79899 Other long term (current) drug therapy: Secondary | ICD-10-CM | POA: Diagnosis not present

## 2015-08-01 DIAGNOSIS — F323 Major depressive disorder, single episode, severe with psychotic features: Secondary | ICD-10-CM | POA: Diagnosis present

## 2015-08-01 DIAGNOSIS — Z9981 Dependence on supplemental oxygen: Secondary | ICD-10-CM | POA: Diagnosis not present

## 2015-08-01 LAB — COMPREHENSIVE METABOLIC PANEL
ALBUMIN: 4.5 g/dL (ref 3.5–5.0)
ALK PHOS: 114 U/L (ref 38–126)
ALT: 17 U/L (ref 14–54)
ANION GAP: 10 (ref 5–15)
AST: 32 U/L (ref 15–41)
BILIRUBIN TOTAL: 0.8 mg/dL (ref 0.3–1.2)
BUN: 9 mg/dL (ref 6–20)
CO2: 32 mmol/L (ref 22–32)
CREATININE: 0.93 mg/dL (ref 0.44–1.00)
Calcium: 9.5 mg/dL (ref 8.9–10.3)
Chloride: 96 mmol/L — ABNORMAL LOW (ref 101–111)
GFR calc Af Amer: >60 mL/min (ref >60)
GFR calc non Af Amer: 60 mL/min — ABNORMAL LOW (ref >60)
GLUCOSE: 94 mg/dL (ref 65–99)
Potassium: 3.3 mmol/L — ABNORMAL LOW (ref 3.5–5.1)
Sodium: 138 mmol/L (ref 135–145)
Total Protein: 8.7 g/dL — ABNORMAL HIGH (ref 6.5–8.1)

## 2015-08-01 LAB — PROTIME-INR
INR: 1.86 — AB (ref 0.00–1.49)
Prothrombin Time: 21.4 seconds — ABNORMAL HIGH (ref 11.6–15.2)

## 2015-08-01 LAB — RAPID URINE DRUG SCREEN, HOSP PERFORMED
Amphetamines: NOT DETECTED
Barbiturates: NOT DETECTED
Benzodiazepines: NOT DETECTED
COCAINE: NOT DETECTED
OPIATES: NOT DETECTED
Tetrahydrocannabinol: NOT DETECTED

## 2015-08-01 LAB — URINALYSIS, ROUTINE W REFLEX MICROSCOPIC
BILIRUBIN URINE: NEGATIVE
Glucose, UA: NEGATIVE mg/dL
KETONES UR: NEGATIVE mg/dL
Leukocytes, UA: NEGATIVE
NITRITE: NEGATIVE
Protein, ur: NEGATIVE mg/dL
Specific Gravity, Urine: 1.007 (ref 1.005–1.030)
UROBILINOGEN UA: 0.2 mg/dL (ref 0.0–1.0)
pH: 6.5 (ref 5.0–8.0)

## 2015-08-01 LAB — CBC WITH DIFFERENTIAL/PLATELET
BASOS PCT: 0 %
Basophils Absolute: 0 10*3/uL (ref 0.0–0.1)
EOS ABS: 0.2 10*3/uL (ref 0.0–0.7)
EOS PCT: 3 %
HCT: 35.3 % — ABNORMAL LOW (ref 36.0–46.0)
HEMOGLOBIN: 11.9 g/dL — AB (ref 12.0–15.0)
Lymphocytes Relative: 17 %
Lymphs Abs: 1.2 10*3/uL (ref 0.7–4.0)
MCH: 31.2 pg (ref 26.0–34.0)
MCHC: 33.7 g/dL (ref 30.0–36.0)
MCV: 92.4 fL (ref 78.0–100.0)
MONO ABS: 0.5 10*3/uL (ref 0.1–1.0)
MONOS PCT: 7 %
NEUTROS PCT: 73 %
Neutro Abs: 4.9 10*3/uL (ref 1.7–7.7)
PLATELETS: 242 10*3/uL (ref 150–400)
RBC: 3.82 MIL/uL — ABNORMAL LOW (ref 3.87–5.11)
RDW: 14.1 % (ref 11.5–15.5)
WBC: 6.7 10*3/uL (ref 4.0–10.5)

## 2015-08-01 LAB — ACETAMINOPHEN LEVEL

## 2015-08-01 LAB — URINE MICROSCOPIC-ADD ON

## 2015-08-01 LAB — ETHANOL: Alcohol, Ethyl (B): <5 mg/dL (ref <5)

## 2015-08-01 LAB — SALICYLATE LEVEL

## 2015-08-01 MED ORDER — ATENOLOL 100 MG PO TABS
100.0000 mg | ORAL_TABLET | Freq: Every day | ORAL | Status: DC
  Administered 2015-08-02 – 2015-08-03 (×2): 100 mg via ORAL
  Filled 2015-08-01 (×2): qty 1

## 2015-08-01 MED ORDER — AMLODIPINE BESYLATE 10 MG PO TABS: 10.0000 mg | ORAL_TABLET | Freq: Every day | ORAL | Status: DC

## 2015-08-01 MED ORDER — LORAZEPAM 1 MG PO TABS
1.0000 mg | ORAL_TABLET | Freq: Once | ORAL | Status: AC
  Administered 2015-08-01: 1 mg via ORAL
  Filled 2015-08-01: qty 1

## 2015-08-01 MED ORDER — HYDROXYZINE PAMOATE 25 MG PO CAPS
25.0000 mg | ORAL_CAPSULE | Freq: Every day | ORAL | Status: DC | PRN
  Filled 2015-08-01: qty 1

## 2015-08-01 MED ORDER — MECLIZINE HCL 25 MG PO TABS: 25.0000 mg | ORAL_TABLET | Freq: Three times a day (TID) | ORAL | Status: DC | PRN

## 2015-08-01 MED ORDER — GABAPENTIN 300 MG PO CAPS
300.0000 mg | ORAL_CAPSULE | Freq: Three times a day (TID) | ORAL | Status: DC
  Administered 2015-08-01 – 2015-08-03 (×5): 300 mg via ORAL
  Filled 2015-08-01 (×5): qty 1

## 2015-08-01 MED ORDER — FLUTICASONE PROPIONATE HFA 220 MCG/ACT IN AERO: 1.0000 | INHALATION_SPRAY | Freq: Two times a day (BID) | RESPIRATORY_TRACT | Status: DC | PRN

## 2015-08-01 MED ORDER — FUROSEMIDE 40 MG PO TABS
20.0000 mg | ORAL_TABLET | Freq: Every day | ORAL | Status: DC
  Administered 2015-08-02 – 2015-08-03 (×2): 20 mg via ORAL
  Filled 2015-08-01 (×2): qty 1

## 2015-08-01 MED ORDER — FLUTICASONE PROPIONATE (INHAL) 250 MCG/BLIST IN AEPB: 2.0000 | INHALATION_SPRAY | RESPIRATORY_TRACT | Status: DC | PRN

## 2015-08-01 MED ORDER — WARFARIN SODIUM 5 MG PO TABS
5.0000 mg | ORAL_TABLET | Freq: Every day | ORAL | Status: DC
  Administered 2015-08-02: 5 mg via ORAL
  Filled 2015-08-01 (×2): qty 1

## 2015-08-01 MED ORDER — FLUOXETINE HCL 20 MG PO CAPS: 20.0000 mg | ORAL_CAPSULE | Freq: Every day | ORAL | Status: DC

## 2015-08-01 MED ORDER — FLUOXETINE HCL 20 MG PO CAPS
20.0000 mg | ORAL_CAPSULE | Freq: Every day | ORAL | Status: DC
  Administered 2015-08-02: 20 mg via ORAL
  Filled 2015-08-01 (×2): qty 1

## 2015-08-01 MED ORDER — AMLODIPINE BESYLATE 10 MG PO TABS
10.0000 mg | ORAL_TABLET | Freq: Every day | ORAL | Status: DC
  Administered 2015-08-02 – 2015-08-03 (×2): 10 mg via ORAL
  Filled 2015-08-01 (×3): qty 1

## 2015-08-01 MED ORDER — HYDROXYZINE HCL 25 MG PO TABS
25.0000 mg | ORAL_TABLET | Freq: Every evening | ORAL | Status: DC | PRN
  Administered 2015-08-01 – 2015-08-02 (×2): 25 mg via ORAL
  Filled 2015-08-01: qty 1

## 2015-08-01 MED ORDER — ALBUTEROL SULFATE HFA 108 (90 BASE) MCG/ACT IN AERS: 2.0000 | INHALATION_SPRAY | Freq: Four times a day (QID) | RESPIRATORY_TRACT | Status: DC | PRN

## 2015-08-01 MED ORDER — LISINOPRIL 20 MG PO TABS
20.0000 mg | ORAL_TABLET | Freq: Every day | ORAL | Status: DC
  Administered 2015-08-02 – 2015-08-03 (×2): 20 mg via ORAL
  Filled 2015-08-01 (×2): qty 1

## 2015-08-01 MED ORDER — WARFARIN - PHYSICIAN DOSING INPATIENT
Freq: Every day | Status: DC
  Administered 2015-08-02: 5

## 2015-08-01 NOTE — ED Notes (Signed)
Upon assessment patient states "I just couldn't deal with everything and wanted to just run away." When asked if and when she ingested the warfarin pt states "I take 1 pill everyday. I have not taken more than that." Pt denies hearing voices. But states that she is mentally and emotionally tired.

## 2015-08-01 NOTE — ED Notes (Signed)
Bed: [id number] Expected date:  Expected time:  Means of arrival:  Comments: [facility] trx 74 y/o F

## 2015-08-01 NOTE — BH Assessment (Addendum)
Assessment Note  [name] [name] is an 74 y.o. female who presents to [facility] under IVC from [place]. IVC states:  " [name] is a 74 yo woman who came voluntarily to the [facility] [facility]. She reported that she is having increased confusion. She is giving confused answers, and is showing signs of dementia. She denies suicidal or homicidal thoughts. She reports that she is having auditory hallucinations and is taking inappropriate medications to relieve hallucinations and confusion. She reported that over a one week period to have taken a bottle of 90 tablets of Warfarin. She is considered to be a threat to herself due to confusion in complying with medications ordered."   Patient states that she has a 67 year old son who she cared for due to having several medical problems which was difficult. Patient states that she "had him put in a facility" where his needs can be met which was difficult for her. Patient states that she has been having difficulty adjusting over the past three months since he has been in the facility. Patient states that after her son moved at her "daughter put my husband on me." Patient states that she is separated but her husband had a stroke and has dementia. She states "he gets confused and thinks the bathroom is the kitchen." Patient states that she is unable to manage his care. Patient states that she went to her psychiatrist "[name] [name]" and was told to walk into a crisis center "to get some rest and get myself back on track." patient states that she was "sent here" after walking into a facility [facility]." Patient denies taking 90 warfarin over the past week to this Probation officer and states that she is here to "get myself together" so that she can move to [place] to be with her other daughter as she is lonely without her son and is unable to care for her husband due to her age.  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 Patient was alert and oriented. Patient did appear impaired or confused at times but was able to be redirected to answer the question in what appears to be an appropriate manner. Patient UDS clear and ETOH <5. Patient denies SI/HI at this time and previous attempts to harm herself. Patient states that she hears "pleasant voices" at times and thinks that her son is still in her house at times. Patient states "I'll just be talking, like he's there and then I'll realize he's not there anymore." Patient denies that the voices are command in nature and is unable to identify when the voices started. Patient states that she sleeps about six hours uninterrupted and her appetite is poor.   Consulted with [name] Boga, DNP who recommends inpatient t treatment at this time once patient is medically cleared and poison control has been contacted. Informed patients nurse that inpatient is recommended once patient is medically cleared.     Diagnosis: Major Depressive Disorder, Recurrent   Past Medical History:  Past Medical History  Diagnosis Date  . Depression   . Schizophrenia ([place])   . Hyperlipidemia   . Hypertension   . Chronic pain     "over my whole body" (03/30/2013)  . Pulmonary embolism ([place]) 12/2009    Large central bilateral PE's   . DVT (deep venous thrombosis) ([place])     per 01/17/10 d/c summary- "remote hx of dvt"?  . Iron  deficiency anemia   . Glaucoma   . Hemorrhoids   . Asthma   . Anxiety   . GERD (gastroesophageal reflux disease)   . Psoriasis 04/29/2012    New onset evaluated by Dr Juliet Rude, Dermatology, [facility] 6/13  Rx 0.1% Tacrolimus ointment  . Complication of anesthesia     "because I have sleep apnea" (03/30/2013)  . Heart murmur   . Chest pain, exertional   . Chronic bronchitis ([place])   . Sleep apnea     "waiting on my CPAP" (03/30/2013)  . Pneumonia     "a few times" (03/30/2013)  . Exertional shortness of breath     "& sometimes when laying down"  (03/30/2013)  . Daily headache     "last 2 months" (03/30/2013)  . Arthritis     "joints" (03/30/2013)  . Varicose veins of legs   . Psoriasis     Past Surgical History  Procedure Laterality Date  . Spinal fusion      2004  . Carpal tunnel release Bilateral   . Tubal ligation  1973  . Dilation and curettage of uterus  1970's    "once" (03/30/2013)  . 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 Joint replacement    . Vein surgery  left leg    Family History:  Family History  Problem Relation Age of Onset  . Diabetes Sister   . Colon cancer Brother 4  . Colon cancer Brother     Social History:  reports that she has never smoked. She has never used smokeless tobacco. She reports that she does not drink alcohol or use illicit drugs.  Additional Social History:  Alcohol / Drug Use Pain Medications: See PTA Prescriptions: See PTA Over the Counter: See PTA History of alcohol / drug use?: Yes Substance #1 Name of Substance 1: "prescription pills" 1 - Age of First Use: 17 1 - Amount (size/oz): UTA 1 - Frequency: UTA 1 - Duration: UTA 1 - Last Use / Amount: UTA  CIWA: CIWA-Ar BP: 158/84 mmHg Pulse Rate: (!) 57 COWS:    Allergies: No Known Allergies  Home Medications:  (Not in a hospital admission)  OB/GYN Status:  Patient's last menstrual period was 11/22/1996.  General Assessment Data [facility] of [facility]: [facility] TTS Assessment: In system Is this a Tele or Face-to-Face Assessment?: Face-to-Face Is this an Initial Assessment or a Re-assessment for this encounter?: Initial Assessment Marital status: Separated Is patient pregnant?: No Pregnancy Status: No Living Arrangements: Children Can pt return to current living arrangement?: Yes Admission Status: Involuntary Is patient capable of signing voluntary admission?: Yes Referral Source: Self/Family/Friend     Crisis Care Plan Living Arrangements: Children Name of Psychiatrist: [place]  Education Status Is patient  currently in school?: No Highest grade of school patient has completed: 8  Risk to self with the past 6 months Suicidal Ideation: No Has patient been a risk to self within the past 6 months prior to admission? : No Suicidal Intent: No Has patient had any suicidal intent within the past 6 months prior to admission? : No Is patient at risk for suicide?: No Suicidal Plan?: No Has patient had any suicidal plan within the past 6 months prior to admission? : No Access to Means: No What has been your use of drugs/alcohol within the last 12 months?: Denies Previous Attempts/Gestures: No How many times?: 0 Other Self Harm Risks: Denies Triggers for Past Attempts: None known Intentional Self Injurious Behavior: None Family Suicide History:  No Recent stressful life event(s): Other (Comment) ("burned out" being a caretaker) Depression: Yes Depression Symptoms: Insomnia, Tearfulness Substance abuse history and/or treatment for substance abuse?: No Suicide prevention information given to non-admitted patients: Not applicable  Risk to Others within the past 6 months Homicidal Ideation: No Does patient have any lifetime risk of violence toward others beyond the six months prior to admission? : No Thoughts of Harm to Others: No Current Homicidal Intent: No Current Homicidal Plan: No Access to Homicidal Means: No Identified Victim: Denies History of harm to others?: No Assessment of Violence: None Noted Violent Behavior Description: None reported Does patient have access to weapons?: No Criminal Charges Pending?: No Does patient have a court date: No Is patient on probation?: No  Psychosis Hallucinations: Auditory ("they real pleasant")  Mental Status Report Appearance/Hygiene: In scrubs Eye Contact: Good Motor Activity: Unable to assess Speech: Logical/coherent, Tangential Level of Consciousness: Alert Mood: Pleasant Affect: Appropriate to circumstance Anxiety Level: None Thought  Processes: Coherent, Flight of Ideas Judgement: Partial Orientation: Person, Place, Time, Situation, Appropriate for developmental age Obsessive Compulsive Thoughts/Behaviors: None  Cognitive Functioning Concentration: Decreased Memory: Recent Intact, Remote Intact IQ: Average Insight: Fair Impulse Control: Good Appetite: Poor Total Hours of Sleep: 6 Vegetative Symptoms: None  ADLScreening [facility] Assessment Services) Patient's cognitive ability adequate to safely complete daily activities?: Yes Patient able to express need for assistance with ADLs?: Yes  Prior Inpatient Therapy Prior Inpatient Therapy: Yes  Prior Outpatient Therapy Prior Outpatient Therapy: Yes Prior Therapy Dates: 20 years Prior Therapy Facilty/Provider(s): [facility]  ADL Screening (condition at time of admission) Patient's cognitive ability adequate to safely complete daily activities?: Yes Is the patient deaf or have difficulty hearing?: No Does the patient have difficulty seeing, even when wearing glasses/contacts?: No Does the patient have difficulty concentrating, remembering, or making decisions?: No Patient able to express need for assistance with ADLs?: Yes Does the patient have difficulty dressing or bathing?: No Does the patient have difficulty walking or climbing stairs?: Yes Weakness of Legs: None Weakness of Arms/Hands: None  Home Assistive Devices/Equipment Home Assistive Devices/Equipment: Cane (specify quad or straight)  Therapy Consults (therapy consults require a physician order) PT Evaluation Needed: No OT Evalulation Needed: No SLP Evaluation Needed: No Abuse/Neglect Assessment (Assessment to be complete while patient is alone) Physical Abuse: Denies Verbal Abuse: Denies Sexual Abuse: Denies Exploitation of patient/patient's resources: Denies Self-Neglect: Denies Values / Beliefs Cultural Requests During Hospitalization: None Spiritual Requests During Hospitalization:  None Consults Spiritual Care Consult Needed: No Social Work Consult Needed: No Regulatory affairs officer (For Healthcare) Does patient have an advance directive?: No Would patient like information on creating an advanced directive?: No - patient declined information    Additional Information 1:1 In Past 12 Months?: No CIRT Risk: No Elopement Risk: No Does patient have medical clearance?: Yes     Disposition:  Disposition Initial Assessment Completed for this Encounter: Yes Disposition of Patient: Inpatient treatment program Type of inpatient treatment program: Adult  On Site Evaluation by:   Reviewed with Physician:    Abdiel Blackerby 08/01/2015 7:59 PM

## 2015-08-01 NOTE — ED Notes (Signed)
Pt states that "she is apart (referring to the PA) of the [employer] and making us move around like slaves. Pt redirected and sent back into room where she was made aware of her surroundings.

## 2015-08-01 NOTE — ED Provider Notes (Signed)
CSN: [id number]     Arrival date & time 08/01/15  1417 History   First MD Initiated Contact with Patient 08/01/15 1459     Chief Complaint  Patient presents with  . IVC      (Consider location/radiation/quality/duration/timing/severity/associated sxs/prior Treatment) Patient is a 74 y.o. female presenting with Ingested Medication.  Ingestion This is a new problem. Episode onset: over last week. The problem occurs constantly. The problem has not changed since onset.Pertinent negatives include no chest pain, no abdominal pain, no headaches and no shortness of breath. Nothing aggravates the symptoms. Nothing relieves the symptoms.    Past Medical History  Diagnosis Date  . Depression   . Schizophrenia (HCC)   . Hyperlipidemia   . Hypertension   . Chronic pain     "over my whole body" (03/30/2013)  . Pulmonary embolism (HCC) 12/2009    Large central bilateral PE's   . DVT (deep venous thrombosis) (HCC)     per 01/17/10 d/c summary- "remote hx of dvt"?  . Iron deficiency anemia   . Glaucoma   . Hemorrhoids   . Asthma   . Anxiety   . GERD (gastroesophageal reflux disease)   . Psoriasis 04/29/2012    New onset evaluated by Dr Marylou FlesherWilliam Huang, Dermatology, [facility]  Rx 0.1% Tacrolimus ointment  . Complication of anesthesia     "because I have sleep apnea" (03/30/2013)  . Heart murmur   . Chest pain, exertional   . Chronic bronchitis (HCC)   . Sleep apnea     "waiting on my CPAP" (03/30/2013)  . Pneumonia     "a few times" (03/30/2013)  . Exertional shortness of breath     "& sometimes when laying down" (03/30/2013)  . Daily headache     "last 2 months" (03/30/2013)  . Arthritis     "joints" (03/30/2013)  . Varicose veins of legs   . Psoriasis    Past Surgical History  Procedure Laterality Date  . Spinal fusion      2004  . Carpal tunnel release Bilateral   . Tubal ligation  1973  . Dilation and curettage of uterus  1970's    "once" (03/30/2013)  . Total knee arthroplasty Right  11/2008  . Joint replacement    . Vein surgery  left leg   Family History  Problem Relation Age of Onset  . Diabetes Sister   . Colon cancer Brother 40  . Colon cancer Brother    Social History  Substance Use Topics  . Smoking status: Never Smoker   . Smokeless tobacco: Never Used  . Alcohol Use: No   OB History    No data available     Review of Systems  Respiratory: Negative for shortness of breath.   Cardiovascular: Negative for chest pain.  Gastrointestinal: Negative for abdominal pain.  Neurological: Negative for headaches.  All other systems reviewed and are negative.     Allergies  Review of patient's allergies indicates no known allergies.  Home Medications   Prior to Admission medications   Medication Sig Start Date End Date Taking? Authorizing Provider  albuterol (PROVENTIL HFA;VENTOLIN HFA) 108 (90 BASE) MCG/ACT inhaler Inhale 2 puffs into the lungs every 6 (six) hours as needed for wheezing or shortness of breath (wheezing). 12/08/14  Yes Tyrone Nineyan B Grunz, MD  amLODipine (NORVASC) 10 MG tablet Take 10 mg by mouth daily. 04/18/15  Yes Historical Provider, MD  atenolol (TENORMIN) 100 MG tablet TAKE 1 TABLET BY MOUTH EVERY  DAY 02/20/15  Yes Tyrone Nine, MD  esomeprazole (NEXIUM) 40 MG capsule Take 1 capsule (40 mg total) by mouth daily. 06/18/15  Yes Mady Gemma, PA-C  FLUoxetine (PROZAC) 20 MG capsule Take 3 capsules (60 mg total) by mouth daily. Patient taking differently: Take 20 mg by mouth daily.  05/26/14  Yes Tyrone Nine, MD  Fluticasone Propionate, Inhal, (FLOVENT DISKUS) 250 MCG/BLIST AEPB Inhale 2 puffs into the lungs every 4 (four) hours as needed (for shortness of breath and coughing).   Yes Historical Provider, MD  furosemide (LASIX) 20 MG tablet Take 1 tablet (20 mg total) by mouth daily. 10/29/14  Yes Tyrone Nine, MD  gabapentin (NEURONTIN) 300 MG capsule Take 300 mg by mouth 3 (three) times daily.  05/07/15  Yes Historical Provider, MD  hydrOXYzine  (VISTARIL) 25 MG capsule Take 25 mg by mouth daily as needed for anxiety (and sleep).   Yes Historical Provider, MD  lisinopril (PRINIVIL,ZESTRIL) 20 MG tablet Take 1 tablet (20 mg total) by mouth daily. 12/08/14  Yes Tyrone Nine, MD  meclizine (ANTIVERT) 25 MG tablet Take 1 tablet (25 mg total) by mouth 3 (three) times daily as needed for dizziness. 02/01/15  Yes Arby Barrette, MD  traMADol (ULTRAM) 50 MG tablet Take 1 tablet (50 mg total) by mouth every 6 (six) hours as needed. 06/12/15  Yes Kaitlyn Szekalski, PA-C  warfarin (COUMADIN) 5 MG tablet TAKE 1 TABLET BY MOUTH EVERY DAY 10/18/14  Yes Levert Feinstein, MD  cycloSPORINE (RESTASIS) 0.05 % ophthalmic emulsion Place 2 drops into both eyes daily as needed (for dry eyes).    Historical Provider, MD  gabapentin (NEURONTIN) 400 MG capsule Take 1 capsule (400 mg total) by mouth 3 (three) times daily. Patient not taking: Reported on 07/03/2015 12/08/14   Tyrone Nine, MD  hydrOXYzine (ATARAX/VISTARIL) 10 MG tablet Take 1 tablet (10 mg total) by mouth every 6 (six) hours as needed for itching (itching). Patient not taking: Reported on 08/01/2015 05/11/15   Paula Libra, MD  silver sulfADIAZINE (SILVADENE) 1 % cream APPLY TOPICALLY EVERY DAY Patient not taking: Reported on 07/03/2015 12/10/14   Tyrone Nine, MD  triamcinolone cream (KENALOG) 0.1 % Apply 1 application topically 2 (two) times daily as needed (for legs). 10/29/14   Tyrone Nine, MD   BP 140/78 mmHg  Pulse 56  Temp(Src) 98.6 F (37 C) (Oral)  Resp 20  SpO2 99%  LMP 11/22/1996 Physical Exam  Constitutional: She is oriented to person, place, and time. She appears well-developed and well-nourished.  HENT:  Head: Normocephalic and atraumatic.  Right Ear: External ear normal.  Left Ear: External ear normal.  Eyes: Conjunctivae and EOM are normal. Pupils are equal, round, and reactive to light.  Neck: Normal range of motion. Neck supple.  Cardiovascular: Normal rate, regular rhythm, normal  heart sounds and intact distal pulses.   Pulmonary/Chest: Effort normal and breath sounds normal.  Abdominal: Soft. Bowel sounds are normal. There is no tenderness.  Musculoskeletal: Normal range of motion.  Neurological: She is alert and oriented to person, place, and time.  Skin: Skin is warm and dry.  Vitals reviewed.   ED Course  Procedures (including critical care time) Labs Review Labs Reviewed  COMPREHENSIVE METABOLIC PANEL - Abnormal; Notable for the following:    Potassium 3.3 (*)    Chloride 96 (*)    Total Protein 8.7 (*)    GFR calc non Af Amer 60 (*)  All other components within normal limits  CBC WITH DIFFERENTIAL/PLATELET - Abnormal; Notable for the following:    RBC 3.82 (*)    Hemoglobin 11.9 (*)    HCT 35.3 (*)    All other components within normal limits  PROTIME-INR - Abnormal; Notable for the following:    Prothrombin Time 21.4 (*)    INR 1.86 (*)    All other components within normal limits  ACETAMINOPHEN LEVEL - Abnormal; Notable for the following:    Acetaminophen (Tylenol), Serum <10 (*)    All other components within normal limits  URINALYSIS, ROUTINE W REFLEX MICROSCOPIC (NOT AT [facility]) - Abnormal; Notable for the following:    Hgb urine dipstick TRACE (*)    All other components within normal limits  ETHANOL  URINE RAPID DRUG SCREEN, HOSP PERFORMED  SALICYLATE LEVEL  URINE MICROSCOPIC-ADD ON  PROTIME-INR    Imaging Review No results found. I have personally reviewed and evaluated these images and lab results as part of my medical decision-making.   EKG Interpretation   Date/Time:  Monday August 01 2015 15:58:14 EST Ventricular Rate:  61 PR Interval:  198 QRS Duration: 76 QT Interval:  422 QTC Calculation: 424 R Axis:   17 Text Interpretation:  Normal sinus rhythm Moderate voltage criteria for  LVH, may be normal variant Borderline ECG No significant change since last  tracing Confirmed by Mirian Mo [phone]) on 08/01/2015  11:33:35 PM      MDM   Final diagnoses:  None    74 y.o. female with pertinent PMH of schizophrenia, HLD, HTN, DVT/PE on coumadin presents after she had a large amount of coumadin go missing.  Pt denies HI/SI, however is not forthcoming about taking coumadin on my exam.  When confronted, she denies SI.  Exam benign.    Consulted TTS  I have reviewed all laboratory and imaging studies if ordered as above      Mirian Mo, MD 08/01/15 [phone]

## 2015-08-01 NOTE — ED Notes (Signed)
Pt states that they used to use phenobarbitol for detox. I am in detox aren't I? That's what they used [place] use it at [place].

## 2015-08-01 NOTE — BHH Counselor (Signed)
Patients nurse states that poison control cannot be contacted due to no time/date of ingestion and patient denying she ingested more medication than prescribed.    [name] PokeJoVea Sumie Remsen, LCSW Therapeutic Triage Specialist [facility] 08/01/2015 8:16 PM'

## 2015-08-01 NOTE — ED Notes (Signed)
Per [facility] paper work, states patient presented to them with confusion-states she took 90 tabs of warfarin in 1 week, denies HI/SI-here with [employer]

## 2015-08-02 ENCOUNTER — Emergency Department (HOSPITAL_COMMUNITY): Payer: Medicare Other

## 2015-08-02 DIAGNOSIS — F333 Major depressive disorder, recurrent, severe with psychotic symptoms: Secondary | ICD-10-CM

## 2015-08-02 LAB — PROTIME-INR
INR: 2.01 — ABNORMAL HIGH (ref 0.00–1.49)
Prothrombin Time: 22.7 s — ABNORMAL HIGH (ref 11.6–15.2)

## 2015-08-02 MED ORDER — PANTOPRAZOLE SODIUM 40 MG PO TBEC
40.0000 mg | DELAYED_RELEASE_TABLET | Freq: Every day | ORAL | Status: DC
  Administered 2015-08-02 – 2015-08-03 (×2): 40 mg via ORAL
  Filled 2015-08-02 (×2): qty 1

## 2015-08-02 MED ORDER — SERTRALINE HCL 50 MG PO TABS
50.0000 mg | ORAL_TABLET | Freq: Every day | ORAL | Status: DC
  Administered 2015-08-03: 50 mg via ORAL
  Filled 2015-08-02: qty 1

## 2015-08-02 NOTE — Progress Notes (Signed)
Patient ID: [name], female   DOB: 08/30/1941, 74 y.o.   MRN: [id number] Contacted [facility] on [address] at [phone]. Per [name] DawnLaquesta at [facility], patient had Tramadol 50 mg #90 filled on 07/28/15. Given the patient's report that she has taken all 90 tablets since Rx was filled, Poison Control will be contacted for recommendations.  [name] SamFran [name], FNP-BC [facility] 08/02/2015 1:21 PM

## 2015-08-02 NOTE — ED Notes (Signed)
TTS at bedside. 

## 2015-08-02 NOTE — BH Assessment (Signed)
[facility] Assessment Progress Note  Pt referred to [facility].  Decision pending as of this writing.  Doylene Canninghomas Ariyannah Pauling, MA Triage Specialist [phone]

## 2015-08-02 NOTE — ED Notes (Signed)
Patient transported to X-ray 

## 2015-08-02 NOTE — ED Notes (Signed)
Poison control contacted. Normal ingestion labs should be collected, including EKG. Patient is asymptomatic and has been taking Tramadol since 11/3. Although 90 tablets have been reportedly ingested, poison control stated there is not much that needs to be doing as far as monitoring patient.

## 2015-08-02 NOTE — Consult Note (Signed)
[place] [facility] Consult   Reason for Consult:  Psychiatric Evaluation Referring Physician:  EDP Patient Identification: [name] [name] MRN:  [id number] Principal Diagnosis: Major depressive disorder with psychotic features [facility]) Diagnosis:   Patient Active Problem List   Diagnosis Date Noted  . Major depressive disorder, recurrent, severe without psychotic features ([place]) [F33.2] 04/26/2015  . Osteoarthritis of left knee [M17.9] 10/29/2014  . Back pain [M54.9] 06/09/2013  . Vertigo [R42] 05/31/2012  . Psoriasis [L40.9] 04/29/2012  . Chronic anticoagulation [Z79.01] 12/11/2011  . Heterozygous factor V Leiden mutation ([place]) [D68.51] 01/18/2011  . Anemia, normocytic normochromic [D64.9] 01/18/2011  . GERD [K21.9] 12/21/2010  . Family history of malignant neoplasm of gastrointestinal tract [Z80.0] 12/21/2010  . Iron deficiency anemia, unspecified [D50.9] 12/21/2010  . Varicose veins of both lower extremities with pain [I83.813] 12/15/2010  . Long term current use of anticoagulant therapy [Z79.01] 11/30/2010  . History of pulmonary embolism [Z86.711] 01/30/2010  . Dyslipidemia [E78.5] 01/10/2010  . Asthma [J45.909] 01/10/2010  . Obesity [E66.9] 09/26/2009  . Major depressive disorder with psychotic features ([place]) [F32.3] 09/26/2009  . Chronic pain syndrome [G89.4] 09/26/2009  . Essential hypertension, benign [I10] 09/26/2009    Total Time spent with patient: 45 minutes  Subjective:   [name] [name] is a 74 y.o. female patient who states "if I didn't runaway and take my name off the lease. I have been self-medicating with Tramadol."  HPI: [name] [name] is a 74 yo [place] American female who presented to [facility] ED under IVC for evaluation of confusion. Per the IVC, "[name] is a 74 yo woman who came voluntarily to the [facility]. She reported that she is having increased confusion. She is giving confused answers, and is showing signs of dementia. She denies suicidal or homicidal  thoughts. She reports that she is having auditory hallucinations and is taking inappropriate medications to relieve hallucinations and confusion. She reported that over a one week period to have taken a bottle of 90 tablets of Warfarin. She is considered to be a threat to herself due to confusion in complying with medications ordered." Adanna states that she didn't take 90 Warfarin tablets but 90 Tramadol 50 mg over the past week "to keep from dealing with stuff and so I don't have to hear my husband arguing." She state her husband was in a wreck and "his mind is destroyed and son (age 26) is in rehab with a feeding tube in [place]." Cayci admits to being depressed and states that her marital relationship is a stressor for her. In addition to Tramadol and Warfarin, Madason states she takes Prozac and "something for anxiety but it don't help." She receives medication management at [facility].   She denies suicidal or homicidal ideation, intent or plan. She reports she hears her son's voice and that she answers him.   Past Psychiatric History: Major depressive disorder  Risk to Self: Suicidal Ideation: No Suicidal Intent: No Is patient at risk for suicide?: No Suicidal Plan?: No Access to Means: No What has been your use of drugs/alcohol within the last 12 months?: Denies How many times?: 0 Other Self Harm Risks: Denies Triggers for Past Attempts: None known Intentional Self Injurious Behavior: None Risk to Others: Homicidal Ideation: No Thoughts of Harm to Others: No Current Homicidal Intent: No Current Homicidal Plan: No Access to Homicidal Means: No Identified Victim: Denies History of harm to others?: No Assessment of Violence: None Noted Violent Behavior Description: None reported Does patient have access to weapons?: No  Criminal Charges Pending?: No Does patient have a court date: No Prior Inpatient Therapy: Prior Inpatient Therapy: Yes Prior Outpatient Therapy: Prior Outpatient  Therapy: Yes Prior Therapy Dates: 20 years Prior Therapy Facilty/Provider(s): [facility]  Past Medical History:  Past Medical History  Diagnosis Date  . Depression   . Schizophrenia ([place])   . Hyperlipidemia   . Hypertension   . Chronic pain     "over my whole body" (03/30/2013)  . Pulmonary embolism ([place]) 12/2009    Large central bilateral PE's   . DVT (deep venous thrombosis) ([place])     per 01/17/10 d/c summary- "remote hx of dvt"?  . Iron deficiency anemia   . Glaucoma   . Hemorrhoids   . Asthma   . Anxiety   . GERD (gastroesophageal reflux disease)   . Psoriasis 04/29/2012    New onset evaluated by Dr Juliet Rude, Dermatology, [facility] 6/13  Rx 0.1% Tacrolimus ointment  . Complication of anesthesia     "because I have sleep apnea" (03/30/2013)  . Heart murmur   . Chest pain, exertional   . Chronic bronchitis ([place])   . Sleep apnea     "waiting on my CPAP" (03/30/2013)  . Pneumonia     "a few times" (03/30/2013)  . Exertional shortness of breath     "& sometimes when laying down" (03/30/2013)  . Daily headache     "last 2 months" (03/30/2013)  . Arthritis     "joints" (03/30/2013)  . Varicose veins of legs   . Psoriasis     Past Surgical History  Procedure Laterality Date  . Spinal fusion      2004  . Carpal tunnel release Bilateral   . Tubal ligation  1973  . Dilation and curettage of uterus  1970's    "once" (03/30/2013)  . 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 Joint replacement    . Vein surgery  left leg   Family History:  Family History  Problem Relation Age of Onset  . Diabetes Sister   . Colon cancer Brother 95  . Colon cancer Brother    Family Psychiatric  History: Unknown Social History:  History  Alcohol Use No     History  Drug Use No    Social History   Social History  . Marital Status: Married    Spouse Name: N/A  . Number of Children: N/A  . Years of Education: N/A   Occupational History  . 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 Smoking status: Never Smoker   . Smokeless tobacco: Never Used  . Alcohol Use: No  . Drug Use: No  . Sexual Activity: No   Other Topics Concern  . None   Social History Narrative   Lives with her husband, [name], and her son, [name].  Has 2 other children.  [name] is mentally handicapped and has a severe seizure disorder, with PEG in place. 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   Additional Social History:    Pain Medications: See PTA Prescriptions: See PTA Over the Counter: See PTA History of alcohol / drug use?: Yes Name of Substance 1: "prescription pills" 1 - Age of First Use: 17 1 - Amount (size/oz): UTA 1 - Frequency: UTA 1 - Duration: UTA 1 - Last Use / Amount: UTA    Allergies:  No Known Allergies  Labs:  Results for orders placed or performed during the hospital encounter of 08/01/15 (from  the past 48 hour(s))  Comprehensive metabolic panel     Status: Abnormal   Collection Time: 08/01/15  3:37 PM  Result Value Ref Range   Sodium 138 135 - 145 mmol/L   Potassium 3.3 (L) 3.5 - 5.1 mmol/L   Chloride 96 (L) 101 - 111 mmol/L   CO2 32 22 - 32 mmol/L   Glucose, Bld 94 65 - 99 mg/dL   BUN 9 6 - 20 mg/dL   Creatinine, Ser 0.93 0.44 - 1.00 mg/dL   Calcium 9.5 8.9 - 10.3 mg/dL   Total Protein 8.7 (H) 6.5 - 8.1 g/dL   Albumin 4.5 3.5 - 5.0 g/dL   AST 32 15 - 41 U/L   ALT 17 14 - 54 U/L   Alkaline Phosphatase 114 38 - 126 U/L   Total Bilirubin 0.8 0.3 - 1.2 mg/dL   GFR calc non Af Amer 60 (L) >60 mL/min   GFR calc Af Amer >60 >60 mL/min    Comment: (NOTE) The eGFR has been calculated using the CKD EPI equation. This calculation has not been validated in all clinical situations. eGFR's persistently <60 mL/min signify possible Chronic Kidney Disease.    Anion gap 10 5 - 15  Ethanol     Status: None   Collection Time: 08/01/15  3:37 PM  Result Value Ref Range   Alcohol, Ethyl (B) <5 <5 mg/dL    Comment:        LOWEST DETECTABLE LIMIT FOR SERUM ALCOHOL  IS 5 mg/dL FOR MEDICAL PURPOSES ONLY   CBC with Diff     Status: Abnormal   Collection Time: 08/01/15  3:37 PM  Result Value Ref Range   WBC 6.7 4.0 - 10.5 K/uL   RBC 3.82 (L) 3.87 - 5.11 MIL/uL   Hemoglobin 11.9 (L) 12.0 - 15.0 g/dL   HCT 35.3 (L) 36.0 - 46.0 %   MCV 92.4 78.0 - 100.0 fL   MCH 31.2 26.0 - 34.0 pg   MCHC 33.7 30.0 - 36.0 g/dL   RDW 14.1 11.5 - 15.5 %   Platelets 242 150 - 400 K/uL   Neutrophils Relative % 73 %   Neutro Abs 4.9 1.7 - 7.7 K/uL   Lymphocytes Relative 17 %   Lymphs Abs 1.2 0.7 - 4.0 K/uL   Monocytes Relative 7 %   Monocytes Absolute 0.5 0.1 - 1.0 K/uL   Eosinophils Relative 3 %   Eosinophils Absolute 0.2 0.0 - 0.7 K/uL   Basophils Relative 0 %   Basophils Absolute 0.0 0.0 - 0.1 K/uL  Protime-INR     Status: Abnormal   Collection Time: 08/01/15  3:37 PM  Result Value Ref Range   Prothrombin Time 21.4 (H) 11.6 - 15.2 seconds   INR 1.86 (H) 6.30 - 1.60  Salicylate level     Status: None   Collection Time: 08/01/15  3:37 PM  Result Value Ref Range   Salicylate Lvl <1.0 2.8 - 30.0 mg/dL  Acetaminophen level     Status: Abnormal   Collection Time: 08/01/15  3:37 PM  Result Value Ref Range   Acetaminophen (Tylenol), Serum <10 (L) 10 - 30 ug/mL    Comment:        THERAPEUTIC CONCENTRATIONS VARY SIGNIFICANTLY. A RANGE OF 10-30 ug/mL MAY BE AN EFFECTIVE CONCENTRATION FOR MANY PATIENTS. HOWEVER, SOME ARE BEST TREATED AT CONCENTRATIONS OUTSIDE THIS RANGE. ACETAMINOPHEN CONCENTRATIONS >150 ug/mL AT 4 HOURS AFTER INGESTION AND >50 ug/mL AT 12 HOURS AFTER INGESTION ARE OFTEN ASSOCIATED  WITH TOXIC REACTIONS. RESULTS CONFIRMED BY MANUAL DILUTION   Urine rapid drug screen (hosp performed)not at [facility]     Status: None   Collection Time: 08/01/15  3:57 PM  Result Value Ref Range   Opiates NONE DETECTED NONE DETECTED   Cocaine NONE DETECTED NONE DETECTED   Benzodiazepines NONE DETECTED NONE DETECTED   Amphetamines NONE DETECTED NONE DETECTED    Tetrahydrocannabinol NONE DETECTED NONE DETECTED   Barbiturates NONE DETECTED NONE DETECTED    Comment:        DRUG SCREEN FOR MEDICAL PURPOSES ONLY.  IF CONFIRMATION IS NEEDED FOR ANY PURPOSE, NOTIFY LAB WITHIN 5 DAYS.        LOWEST DETECTABLE LIMITS FOR URINE DRUG SCREEN Drug Class       Cutoff (ng/mL) Amphetamine      1000 Barbiturate      200 Benzodiazepine   387 Tricyclics       564 Opiates          300 Cocaine          300 THC              50   Urinalysis, Routine w reflex microscopic (not at [facility])     Status: Abnormal   Collection Time: 08/01/15  4:00 PM  Result Value Ref Range   Color, Urine YELLOW YELLOW   APPearance CLEAR CLEAR   Specific Gravity, Urine 1.007 1.005 - 1.030   pH 6.5 5.0 - 8.0   Glucose, UA NEGATIVE NEGATIVE mg/dL   Hgb urine dipstick TRACE (A) NEGATIVE   Bilirubin Urine NEGATIVE NEGATIVE   Ketones, ur NEGATIVE NEGATIVE mg/dL   Protein, ur NEGATIVE NEGATIVE mg/dL   Urobilinogen, UA 0.2 0.0 - 1.0 mg/dL   Nitrite NEGATIVE NEGATIVE   Leukocytes, UA NEGATIVE NEGATIVE  Urine microscopic-add on     Status: None   Collection Time: 08/01/15  4:00 PM  Result Value Ref Range   Squamous Epithelial / LPF RARE RARE   RBC / HPF 0-2 <3 RBC/hpf  Protime-INR     Status: Abnormal   Collection Time: 08/02/15  5:07 AM  Result Value Ref Range   Prothrombin Time 22.7 (H) 11.6 - 15.2 seconds   INR 2.01 (H) 0.00 - 1.49   *Note: Due to a large number of results and/or encounters for the requested time period, some results have not been displayed. A complete set of results can be found in Results Review.    Current Facility-Administered Medications  Medication Dose Route Frequency Provider Last Rate Last Dose  . albuterol (PROVENTIL HFA;VENTOLIN HFA) 108 (90 BASE) MCG/ACT inhaler 2 puff  2 puff Inhalation Q6H PRN Debby Freiberg, MD      . amLODipine (NORVASC) tablet 10 mg  10 mg Oral Daily Debby Freiberg, MD   10 mg at 08/02/15 0939  . atenolol (TENORMIN) tablet 100  mg  100 mg Oral Daily Debby Freiberg, MD   100 mg at 08/02/15 0939  . fluticasone (FLOVENT HFA) 220 MCG/ACT inhaler 1 puff  1 puff Inhalation BID PRN Debby Freiberg, MD      . furosemide (LASIX) tablet 20 mg  20 mg Oral Daily Debby Freiberg, MD   20 mg at 08/02/15 0939  . gabapentin (NEURONTIN) capsule 300 mg  300 mg Oral TID Debby Freiberg, MD   300 mg at 08/02/15 3329  . hydrOXYzine (ATARAX/VISTARIL) tablet 25 mg  25 mg Oral QHS PRN Debby Freiberg, MD   25 mg at 08/01/15 2226  . lisinopril (  PRINIVIL,ZESTRIL) tablet 20 mg  20 mg Oral Daily Debby Freiberg, MD   20 mg at 08/02/15 0939  . meclizine (ANTIVERT) tablet 25 mg  25 mg Oral TID PRN Debby Freiberg, MD      . Derrill Memo ON 08/03/2015] sertraline (ZOLOFT) tablet 50 mg  50 mg Oral Daily Lisl S[place]      . warfarin (COUMADIN) tablet 5 mg  5 mg Oral q1800 Debby Freiberg, MD      . Warfarin - Physician Dosing Inpatient   Does not apply q1800 Berton Mount, [facility]       Current Outpatient Prescriptions  Medication Sig Dispense Refill  . albuterol (PROVENTIL HFA;VENTOLIN HFA) 108 (90 BASE) MCG/ACT inhaler Inhale 2 puffs into the lungs every 6 (six) hours as needed for wheezing or shortness of breath (wheezing). 6.7 g 1  . amLODipine (NORVASC) 10 MG tablet Take 10 mg by mouth daily.  3  . atenolol (TENORMIN) 100 MG tablet TAKE 1 TABLET BY MOUTH EVERY DAY 30 tablet 7  . esomeprazole (NEXIUM) 40 MG capsule Take 1 capsule (40 mg total) by mouth daily. 30 capsule 0  . FLUoxetine (PROZAC) 20 MG capsule Take 3 capsules (60 mg total) by mouth daily. (Patient taking differently: Take 20 mg by mouth daily. ) 90 capsule 11  . Fluticasone Propionate, Inhal, (FLOVENT DISKUS) 250 MCG/BLIST AEPB Inhale 2 puffs into the lungs every 4 (four) hours as needed (for shortness of breath and coughing).    . furosemide (LASIX) 20 MG tablet Take 1 tablet (20 mg total) by mouth daily. 30 tablet 0  . gabapentin (NEURONTIN) 300 MG capsule Take 300 mg by mouth 3 (three) times  daily.     . hydrOXYzine (VISTARIL) 25 MG capsule Take 25 mg by mouth daily as needed for anxiety (and sleep).    Marland Kitchen lisinopril (PRINIVIL,ZESTRIL) 20 MG tablet Take 1 tablet (20 mg total) by mouth daily. 90 tablet 3  . meclizine (ANTIVERT) 25 MG tablet Take 1 tablet (25 mg total) by mouth 3 (three) times daily as needed for dizziness. 30 tablet 0  . traMADol (ULTRAM) 50 MG tablet Take 1 tablet (50 mg total) by mouth every 6 (six) hours as needed. 15 tablet 0  . warfarin (COUMADIN) 5 MG tablet TAKE 1 TABLET BY MOUTH EVERY DAY 30 tablet 4  . cycloSPORINE (RESTASIS) 0.05 % ophthalmic emulsion Place 2 drops into both eyes daily as needed (for dry eyes).    . gabapentin (NEURONTIN) 400 MG capsule Take 1 capsule (400 mg total) by mouth 3 (three) times daily. (Patient not taking: Reported on 07/03/2015) 90 capsule 5  . hydrOXYzine (ATARAX/VISTARIL) 10 MG tablet Take 1 tablet (10 mg total) by mouth every 6 (six) hours as needed for itching (itching). (Patient not taking: Reported on 08/01/2015) 30 tablet 3  . silver sulfADIAZINE (SILVADENE) 1 % cream APPLY TOPICALLY EVERY DAY (Patient not taking: Reported on 07/03/2015) 400 g 0  . triamcinolone cream (KENALOG) 0.1 % Apply 1 application topically 2 (two) times daily as needed (for legs). 80 g 3    Musculoskeletal: Strength & Muscle Tone: within normal limits Gait & Station: normal Patient leans: N/A  Psychiatric Specialty Exam: Review of Systems  Constitutional: Negative.   HENT: Negative.   Eyes: Negative.   Respiratory: Negative.   Cardiovascular: Negative.   Gastrointestinal: Negative.   Genitourinary: Negative.   Musculoskeletal: Negative.   Skin: Negative.   Neurological: Negative.   Endo/Heme/Allergies: Negative.   Psychiatric/Behavioral: Positive for depression and  hallucinations.    Blood pressure 143/74, pulse 48, temperature 98.1 F (36.7 C), temperature source Oral, resp. rate 18, last menstrual period 11/22/1996, SpO2 94 %.There is  no weight on file to calculate BMI.  General Appearance: Fairly Groomed  Engineer, water::  Good  Speech:  Clear and Coherent and Slow  Volume:  Normal  Mood:  Depressed  Affect:  Flat  Thought Process:  Disorganized  Orientation:  Other:  Oriented to person  Thought Content:  Hallucinations: Auditory  Suicidal Thoughts:  No  Homicidal Thoughts:  No  Memory:  Immediate;   Fair Recent;   Fair Remote;   Poor  Judgement:  Poor  Insight:  Lacking  Psychomotor Activity:  Normal  Concentration:  Fair  Recall:  [employer] of Knowledge:Fair  Language: Fair  Akathisia:  No  Handed:  Right  AIMS (if indicated):     Assets:  Communication Skills Desire for Improvement  ADL's:  Intact  Cognition: WNL  Sleep:      Treatment Plan Summary: Daily contact with patient to assess and evaluate symptoms and progress in treatment and Medication management  Resume home medications -Stop Prozac -Start Zoloft 50 mg daily for depression  Disposition: Recommend psychiatric Inpatient admission when medically cleared. Seek placement at geropsych facility.   Serena Colonel, FNP-BC [place] 08/02/2015 12:40 PM Patient seen face-to-face for psychiatric evaluation, chart reviewed and case discussed with the physician extender and developed treatment plan. Reviewed the information documented and agree with the treatment plan. Corena Pilgrim, MD

## 2015-08-03 LAB — PROTIME-INR
INR: 1.72 — ABNORMAL HIGH (ref 0.00–1.49)
PROTHROMBIN TIME: 20.2 s — AB (ref 11.6–15.2)

## 2015-08-03 NOTE — BH Assessment (Signed)
[facility] Assessment Progress Note  Per Selena BattenKim, Nurse Case Manager, Nicholos JohnsKathleen calls [facility] to report that pt's bed is now ready.  Pt's nurse has been informed.  Doylene Canninghomas Renetta Suman, MA Triage Specialist [phone]

## 2015-08-03 NOTE — BH Assessment (Signed)
[facility]. Informed Dr. Blinda LeatherwoodPollina of the request.

## 2015-08-03 NOTE — BH Assessment (Signed)
[facility] Assessment Progress Note  [facility]athleen [facility] reports that pt has been accepted to their facility by Lowanda FosterBeverly Jones, MD.  However, bed is currently not available.  They will call when they are ready for the pt.  Thedore MinsMojeed Akintayo, MD concurs with this decision.  Please call report to [phone].  Pt's nurse has been notified.  Doylene Canninghomas Keyshia Orwick, MA Triage Specialist [phone]

## 2015-08-03 NOTE — ED Notes (Signed)
Patient resting in bed, VS WNL.

## 2015-08-12 ENCOUNTER — Emergency Department (HOSPITAL_COMMUNITY)
Admission: EM | Admit: 2015-08-12 | Discharge: 2015-08-12 | Disposition: A | Discharge status: Home / Self Care | Payer: Medicare Other | Source: Home / Self Care | Attending: Emergency Medicine | Admitting: Emergency Medicine

## 2015-08-12 ENCOUNTER — Encounter (HOSPITAL_COMMUNITY): Payer: Self-pay | Admitting: Emergency Medicine

## 2015-08-12 ENCOUNTER — Emergency Department (HOSPITAL_COMMUNITY)
Admission: EM | Admit: 2015-08-12 | Discharge: 2015-08-12 | Disposition: A | Discharge status: Home / Self Care | Payer: Medicare Other | Attending: Emergency Medicine | Admitting: Emergency Medicine

## 2015-08-12 DIAGNOSIS — F329 Major depressive disorder, single episode, unspecified: Secondary | ICD-10-CM

## 2015-08-12 DIAGNOSIS — R011 Cardiac murmur, unspecified: Secondary | ICD-10-CM | POA: Insufficient documentation

## 2015-08-12 DIAGNOSIS — Z8639 Personal history of other endocrine, nutritional and metabolic disease: Secondary | ICD-10-CM | POA: Insufficient documentation

## 2015-08-12 DIAGNOSIS — Z7901 Long term (current) use of anticoagulants: Secondary | ICD-10-CM | POA: Insufficient documentation

## 2015-08-12 DIAGNOSIS — Z872 Personal history of diseases of the skin and subcutaneous tissue: Secondary | ICD-10-CM | POA: Insufficient documentation

## 2015-08-12 DIAGNOSIS — M79605 Pain in left leg: Secondary | ICD-10-CM | POA: Insufficient documentation

## 2015-08-12 DIAGNOSIS — Z86711 Personal history of pulmonary embolism: Secondary | ICD-10-CM | POA: Insufficient documentation

## 2015-08-12 DIAGNOSIS — Z862 Personal history of diseases of the blood and blood-forming organs and certain disorders involving the immune mechanism: Secondary | ICD-10-CM | POA: Diagnosis not present

## 2015-08-12 DIAGNOSIS — I8393 Asymptomatic varicose veins of bilateral lower extremities: Secondary | ICD-10-CM | POA: Insufficient documentation

## 2015-08-12 DIAGNOSIS — F209 Schizophrenia, unspecified: Secondary | ICD-10-CM | POA: Insufficient documentation

## 2015-08-12 DIAGNOSIS — L299 Pruritus, unspecified: Secondary | ICD-10-CM

## 2015-08-12 DIAGNOSIS — Z8701 Personal history of pneumonia (recurrent): Secondary | ICD-10-CM

## 2015-08-12 DIAGNOSIS — Z86718 Personal history of other venous thrombosis and embolism: Secondary | ICD-10-CM | POA: Insufficient documentation

## 2015-08-12 DIAGNOSIS — G4733 Obstructive sleep apnea (adult) (pediatric): Secondary | ICD-10-CM

## 2015-08-12 DIAGNOSIS — Z9981 Dependence on supplemental oxygen: Secondary | ICD-10-CM | POA: Insufficient documentation

## 2015-08-12 DIAGNOSIS — Z8679 Personal history of other diseases of the circulatory system: Secondary | ICD-10-CM | POA: Insufficient documentation

## 2015-08-12 DIAGNOSIS — Z8739 Personal history of other diseases of the musculoskeletal system and connective tissue: Secondary | ICD-10-CM | POA: Insufficient documentation

## 2015-08-12 DIAGNOSIS — Z7951 Long term (current) use of inhaled steroids: Secondary | ICD-10-CM | POA: Insufficient documentation

## 2015-08-12 DIAGNOSIS — G8929 Other chronic pain: Secondary | ICD-10-CM

## 2015-08-12 DIAGNOSIS — I10 Essential (primary) hypertension: Secondary | ICD-10-CM | POA: Diagnosis not present

## 2015-08-12 DIAGNOSIS — F419 Anxiety disorder, unspecified: Secondary | ICD-10-CM | POA: Insufficient documentation

## 2015-08-12 DIAGNOSIS — K219 Gastro-esophageal reflux disease without esophagitis: Secondary | ICD-10-CM | POA: Insufficient documentation

## 2015-08-12 DIAGNOSIS — H409 Unspecified glaucoma: Secondary | ICD-10-CM | POA: Insufficient documentation

## 2015-08-12 DIAGNOSIS — J45909 Unspecified asthma, uncomplicated: Secondary | ICD-10-CM

## 2015-08-12 DIAGNOSIS — M79604 Pain in right leg: Secondary | ICD-10-CM | POA: Insufficient documentation

## 2015-08-12 DIAGNOSIS — M1990 Unspecified osteoarthritis, unspecified site: Secondary | ICD-10-CM | POA: Insufficient documentation

## 2015-08-12 DIAGNOSIS — M79602 Pain in left arm: Secondary | ICD-10-CM

## 2015-08-12 DIAGNOSIS — Z79899 Other long term (current) drug therapy: Secondary | ICD-10-CM

## 2015-08-12 DIAGNOSIS — L819 Disorder of pigmentation, unspecified: Secondary | ICD-10-CM | POA: Diagnosis not present

## 2015-08-12 DIAGNOSIS — R600 Localized edema: Secondary | ICD-10-CM | POA: Insufficient documentation

## 2015-08-12 MED ORDER — DIPHENHYDRAMINE HCL 25 MG PO CAPS
25.0000 mg | ORAL_CAPSULE | Freq: Once | ORAL | Status: AC
  Administered 2015-08-12: 25 mg via ORAL
  Filled 2015-08-12: qty 1

## 2015-08-12 MED ORDER — KETOROLAC TROMETHAMINE 60 MG/2ML IM SOLN
60.0000 mg | Freq: Once | INTRAMUSCULAR | Status: AC
  Administered 2015-08-12: 60 mg via INTRAMUSCULAR
  Filled 2015-08-12: qty 2

## 2015-08-12 MED ORDER — TRAMADOL HCL 50 MG PO TABS
50.0000 mg | ORAL_TABLET | Freq: Once | ORAL | Status: AC
Start: 2015-08-12 — End: 2015-08-12
  Administered 2015-08-12: 50 mg via ORAL
  Filled 2015-08-12: qty 1

## 2015-08-12 NOTE — ED Provider Notes (Signed)
CSN: [id number]     Arrival date & time 08/12/15  1735 History  By signing my name below, I, [name] [name], attest that this documentation has been prepared under the direction and in the presence of Elson Areas, PA-C. Electronically Signed: Phillis [name], ED Scribe. 08/12/2015. 6:25 PM.   Chief Complaint  Patient presents with  . Leg Pain   The history is provided by the patient. No language interpreter was used.  HPI Comments: [name] [name] is a 74 y.o. Female with a hx of HTN, chronic pain, PE, varicose veins, neuropathy, and DVT brought in by EMS who presents to the Emergency Department complaining of recurrent bilateral leg pain. She states that she went to a nail salon and felt pain in her legs after they used cream and oils on her legs. Pt was seen earlier this morning for the same symptoms and was given benadryl and tylenol upon discharge. She states that this did not work and that a shot of Toradol typically helps her pain when she is seen in the ED. Pt states that she is seen at the vascular center for her varicose veins and imaging, but has not been seen in over a year. Pt has been seen 17 times in 6 months in the ED for the same symptoms. Pt is on Gabapentin and Coumadin at home; she states that she takes her medication daily as directed.    Past Medical History  Diagnosis Date  . Depression   . Schizophrenia (HCC)   . Hyperlipidemia   . Hypertension   . Chronic pain     "over my whole body" (03/30/2013)  . Pulmonary embolism (HCC) 12/2009    Large central bilateral PE's   . DVT (deep venous thrombosis) (HCC)     per 01/17/10 d/c summary- "remote hx of dvt"?  . Iron deficiency anemia   . Glaucoma   . Hemorrhoids   . Asthma   . Anxiety   . GERD (gastroesophageal reflux disease)   . Psoriasis 04/29/2012    New onset evaluated by Dr Marylou Flesher, Dermatology, [facility] 6/13  Rx 0.1% Tacrolimus ointment  . Complication of anesthesia     "because I have sleep apnea"  (03/30/2013)  . Heart murmur   . Chest pain, exertional   . Chronic bronchitis (HCC)   . Sleep apnea     "waiting on my CPAP" (03/30/2013)  . Pneumonia     "a few times" (03/30/2013)  . Exertional shortness of breath     "& sometimes when laying down" (03/30/2013)  . Daily headache     "last 2 months" (03/30/2013)  . Arthritis     "joints" (03/30/2013)  . Varicose veins of legs   . Psoriasis    Past Surgical History  Procedure Laterality Date  . Spinal fusion      2004  . Carpal tunnel release Bilateral   . Tubal ligation  1973  . Dilation and curettage of uterus  1970's    "once" (03/30/2013)  . 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 Joint replacement    . Vein surgery  left leg   Family History  Problem Relation Age of Onset  . Diabetes Sister   . Colon cancer Brother 40  . Colon cancer Brother    Social History  Substance Use Topics  . Smoking status: Never Smoker   . Smokeless tobacco: Never Used  . Alcohol Use: No   OB History    No data available  Review of Systems  Musculoskeletal: Positive for arthralgias.  All other systems reviewed and are negative.  Allergies  Review of patient's allergies indicates no known allergies.  Home Medications   Prior to Admission medications   Medication Sig Start Date End Date Taking? Authorizing Provider  albuterol (PROVENTIL HFA;VENTOLIN HFA) 108 (90 BASE) MCG/ACT inhaler Inhale 2 puffs into the lungs every 6 (six) hours as needed for wheezing or shortness of breath (wheezing). 12/08/14   Tyrone Nineyan B Grunz, MD  amLODipine (NORVASC) 10 MG tablet Take 10 mg by mouth daily. 04/18/15   Historical Provider, MD  atenolol (TENORMIN) 100 MG tablet TAKE 1 TABLET BY MOUTH EVERY DAY 02/20/15   Tyrone Nineyan B Grunz, MD  cycloSPORINE (RESTASIS) 0.05 % ophthalmic emulsion Place 2 drops into both eyes daily as needed (for dry eyes).    Historical Provider, MD  esomeprazole (NEXIUM) 40 MG capsule Take 1 capsule (40 mg total) by mouth daily. 06/18/15    Mady GemmaElizabeth C Westfall, PA-C  FLUoxetine (PROZAC) 20 MG capsule Take 3 capsules (60 mg total) by mouth daily. Patient taking differently: Take 20 mg by mouth daily.  05/26/14   Tyrone Nineyan B Grunz, MD  Fluticasone Propionate, Inhal, (FLOVENT DISKUS) 250 MCG/BLIST AEPB Inhale 2 puffs into the lungs every 4 (four) hours as needed (for shortness of breath and coughing).    Historical Provider, MD  furosemide (LASIX) 20 MG tablet Take 1 tablet (20 mg total) by mouth daily. 10/29/14   Tyrone Nineyan B Grunz, MD  gabapentin (NEURONTIN) 300 MG capsule Take 300 mg by mouth 3 (three) times daily.  05/07/15   Historical Provider, MD  hydrOXYzine (ATARAX/VISTARIL) 10 MG tablet Take 1 tablet (10 mg total) by mouth every 6 (six) hours as needed for itching (itching). Patient not taking: Reported on 08/01/2015 05/11/15   Paula LibraJohn Molpus, MD  lisinopril (PRINIVIL,ZESTRIL) 20 MG tablet Take 1 tablet (20 mg total) by mouth daily. 12/08/14   Tyrone Nineyan B Grunz, MD  meclizine (ANTIVERT) 25 MG tablet Take 1 tablet (25 mg total) by mouth 3 (three) times daily as needed for dizziness. 02/01/15   Arby BarretteMarcy Pfeiffer, MD  silver sulfADIAZINE (SILVADENE) 1 % cream APPLY TOPICALLY EVERY DAY Patient not taking: Reported on 07/03/2015 12/10/14   Tyrone Nineyan B Grunz, MD  traMADol (ULTRAM) 50 MG tablet Take 1 tablet (50 mg total) by mouth every 6 (six) hours as needed. 06/12/15   Emilia BeckKaitlyn Szekalski, PA-C  triamcinolone cream (KENALOG) 0.1 % Apply 1 application topically 2 (two) times daily as needed (for legs). 10/29/14   Tyrone Nineyan B Grunz, MD  warfarin (COUMADIN) 5 MG tablet TAKE 1 TABLET BY MOUTH EVERY DAY 10/18/14   Levert FeinsteinJames M Granfortuna, MD   BP 152/85 mmHg  Pulse 71  Resp 18  SpO2 99%  LMP 11/22/1996 Physical Exam  Constitutional: She is oriented to person, place, and time. She appears well-developed and well-nourished.  HENT:  Head: Normocephalic and atraumatic.  Eyes: Conjunctivae and EOM are normal.  Neck: Normal range of motion. Neck supple.  Musculoskeletal: Normal range  of motion.  Discolored right leg to mid leg; multiple varicosity to bilateral legs  Neurological: She is alert and oriented to person, place, and time.  Skin: Skin is warm and dry.  Psychiatric: She has a normal mood and affect. Her behavior is normal.  Nursing note and vitals reviewed.   ED Course  Procedures (including critical care time) DIAGNOSTIC STUDIES: Oxygen Saturation is 99% on RA, normal by my interpretation.    COORDINATION OF CARE: 6:24  PM-Discussed treatment plan which includes Toradol with pt at bedside and pt agreed to plan.   Labs Review Labs Reviewed - No data to display  Imaging Review No results found. I have personally reviewed and evaluated these images and lab results as part of my medical decision-making.   EKG Interpretation None      MDM  Pt is specifically requesting torodol injection.   Pt reports it is the only thing that helps her pain in her legs.  Pt was seen here early am.  Pt reports no relief from medications.  i counseled pt about management by primary care    Final diagnoses:  Chronic leg pain, left  Chronic pain of right lower extremity     I personally performed the services in this documentation, which was scribed in my presence.  The recorded information has been reviewed and considered.   Barnet Pall.   Lonia Skinner Bassett, PA-C 08/12/15 1844  Doug Sou, MD 08/13/15 [phone]

## 2015-08-12 NOTE — ED Notes (Signed)
Per EMS, pt c/o varicose veins and neuropathy. Pt ambulatory without difficulty in triage. Pt been to ED 17 times in the past 6 months, multiple times for the same thing.

## 2015-08-12 NOTE — ED Notes (Signed)
Bed: [id number] Expected date:  Expected time:  Means of arrival:  Comments: 74 yo F  Itching to lower extremities

## 2015-08-12 NOTE — ED Notes (Addendum)
At approximately 2000 pt started having increased itching and burning in bilateral legs. Pt had pedicure at 1000 and noticed the itching and burning after returning home. Pt has varicose veins,neuropathy and discoloration to bilateral lower extremities. Pt is currently rating pain 10 on a scale from 1- 10.

## 2015-08-12 NOTE — Discharge Instructions (Signed)

## 2015-08-12 NOTE — Discharge Instructions (Signed)
Pruritus [name] [name], see your primary care doctor within 3 days for close follow up.  Take benadryl at home as needed for the itching.  If symptoms worsen, come back to the ED immediately.  Thank you. Pruritus is an itching feeling. There are many different conditions and factors that can make your skin itchy. Dry skin is one of the most common causes of itching. Most cases of itching do not require medical attention. Itchy skin can turn into a rash.  HOME CARE INSTRUCTIONS  Watch your pruritus for any changes. Take these steps to help with your condition:  Skin Care  Moisturize your skin as needed. A moisturizer that contains petroleum jelly is best for keeping moisture in your skin.  Take or apply medicines only as directed by your health care provider. This may include:  Corticosteroid cream.  Anti-itch lotions.  Oral anti-histamines.  Apply cool compresses to the affected areas.  Try taking a bath with:  Epsom salts. Follow the instructions on the packaging. You can get these at your local pharmacy or grocery store.  Baking soda. Pour a small amount into the bath as directed by your health care provider.  Colloidal oatmeal. Follow the instructions on the packaging. You can get this at your local pharmacy or grocery store.  Try applying baking soda paste to your skin. Stir water into baking soda until it reaches a paste-like consistency.   Do not scratch your skin.  Avoid hot showers or baths, which can make itching worse. A cold shower may help with itching as long as you use a moisturizer after.  Avoid scented soaps, detergents, and perfumes. Use gentle soaps, detergents, perfumes, and other cosmetic products. General Instructions  Avoid wearing tight clothes.  Keep a journal to help track what causes your itch. Write down:  What you eat.  What cosmetic products you use.  What you drink.  What you wear. This includes jewelry.  Use a humidifier. This keeps the  air moist, which helps to prevent dry skin. SEEK MEDICAL CARE IF:  The itching does not go away after several days.  You sweat at night.  You have weight loss.  You are unusually thirsty.  You urinate more than normal.  You are more tired than normal.  You have abdominal pain.  Your skin tingles.  You feel weak.  Your skin or the whites of your eyes look yellow (jaundice).  Your skin feels numb.   This information is not intended to replace advice given to you by your health care provider. Make sure you discuss any questions you have with your health care provider.   Document Released: 05/23/2011 Document Revised: 01/25/2015 Document Reviewed: 09/06/2014 Elsevier Interactive Patient Education [employer]2016 [employer] Inc[employer]

## 2015-08-12 NOTE — ED Provider Notes (Signed)
CSN: [id number]     Arrival date & time 08/12/15  0420 History   First MD Initiated Contact with Patient 08/12/15 0503     Chief Complaint  Patient presents with  . Pruritis    to BLE     (Consider location/radiation/quality/duration/timing/severity/associated sxs/prior Treatment) HPI  RIDA LOUDIN is a 74 y.o. female witha past medical history presenting today with bilateral lower extremity pruritus. Patient states at 10 AM she had a pedicure and they rubbed some cream and oils on her legs where she has varicose veins. Subsequently around 6 PM patient developed pruritus of the bilateral lower extremities. She states they are painful and burning as well. She denies any hives or rashes in her body. She had no shortness of breath, throat swelling, or wheezing. She denies any nausea or vomiting. Patient states she had no medication at home to help relieve her symptoms. There are no further complaints.  10 Systems reviewed and are negative for acute change except as noted in the HPI.     Past Medical History  Diagnosis Date  . Depression   . Schizophrenia (HCC)   . Hyperlipidemia   . Hypertension   . Chronic pain     "over my whole body" (03/30/2013)  . Pulmonary embolism (HCC) 12/2009    Large central bilateral PE's   . DVT (deep venous thrombosis) (HCC)     per 01/17/10 d/c summary- "remote hx of dvt"?  . Iron deficiency anemia   . Glaucoma   . Hemorrhoids   . Asthma   . Anxiety   . GERD (gastroesophageal reflux disease)   . Psoriasis 04/29/2012    New onset evaluated by Dr Marylou Flesher, Dermatology, [facility] 6/13  Rx 0.1% Tacrolimus ointment  . Complication of anesthesia     "because I have sleep apnea" (03/30/2013)  . Heart murmur   . Chest pain, exertional   . Chronic bronchitis (HCC)   . Sleep apnea     "waiting on my CPAP" (03/30/2013)  . Pneumonia     "a few times" (03/30/2013)  . Exertional shortness of breath     "& sometimes when laying down" (03/30/2013)  . Daily  headache     "last 2 months" (03/30/2013)  . Arthritis     "joints" (03/30/2013)  . Varicose veins of legs   . Psoriasis    Past Surgical History  Procedure Laterality Date  . Spinal fusion      2004  . Carpal tunnel release Bilateral   . Tubal ligation  1973  . Dilation and curettage of uterus  1970's    "once" (03/30/2013)  . 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 Joint replacement    . Vein surgery  left leg   Family History  Problem Relation Age of Onset  . Diabetes Sister   . Colon cancer Brother 40  . Colon cancer Brother    Social History  Substance Use Topics  . Smoking status: Never Smoker   . Smokeless tobacco: Never Used  . Alcohol Use: No   OB History    No data available     Review of Systems    Allergies  Review of patient's allergies indicates no known allergies.  Home Medications   Prior to Admission medications   Medication Sig Start Date End Date Taking? Authorizing Provider  albuterol (PROVENTIL HFA;VENTOLIN HFA) 108 (90 BASE) MCG/ACT inhaler Inhale 2 puffs into the lungs every 6 (six) hours as needed for wheezing or shortness  of breath (wheezing). 12/08/14   Tyrone Nine, MD  amLODipine (NORVASC) 10 MG tablet Take 10 mg by mouth daily. 04/18/15   Historical Provider, MD  atenolol (TENORMIN) 100 MG tablet TAKE 1 TABLET BY MOUTH EVERY DAY 02/20/15   Tyrone Nine, MD  cycloSPORINE (RESTASIS) 0.05 % ophthalmic emulsion Place 2 drops into both eyes daily as needed (for dry eyes).    Historical Provider, MD  esomeprazole (NEXIUM) 40 MG capsule Take 1 capsule (40 mg total) by mouth daily. 06/18/15   Mady Gemma, PA-C  FLUoxetine (PROZAC) 20 MG capsule Take 3 capsules (60 mg total) by mouth daily. Patient taking differently: Take 20 mg by mouth daily.  05/26/14   Tyrone Nine, MD  Fluticasone Propionate, Inhal, (FLOVENT DISKUS) 250 MCG/BLIST AEPB Inhale 2 puffs into the lungs every 4 (four) hours as needed (for shortness of breath and coughing).     Historical Provider, MD  furosemide (LASIX) 20 MG tablet Take 1 tablet (20 mg total) by mouth daily. 10/29/14   Tyrone Nine, MD  gabapentin (NEURONTIN) 300 MG capsule Take 300 mg by mouth 3 (three) times daily.  05/07/15   Historical Provider, MD  hydrOXYzine (ATARAX/VISTARIL) 10 MG tablet Take 1 tablet (10 mg total) by mouth every 6 (six) hours as needed for itching (itching). Patient not taking: Reported on 08/01/2015 05/11/15   Paula Libra, MD  lisinopril (PRINIVIL,ZESTRIL) 20 MG tablet Take 1 tablet (20 mg total) by mouth daily. 12/08/14   Tyrone Nine, MD  meclizine (ANTIVERT) 25 MG tablet Take 1 tablet (25 mg total) by mouth 3 (three) times daily as needed for dizziness. 02/01/15   Arby Barrette, MD  silver sulfADIAZINE (SILVADENE) 1 % cream APPLY TOPICALLY EVERY DAY Patient not taking: Reported on 07/03/2015 12/10/14   Tyrone Nine, MD  traMADol (ULTRAM) 50 MG tablet Take 1 tablet (50 mg total) by mouth every 6 (six) hours as needed. 06/12/15   Emilia Beck, PA-C  triamcinolone cream (KENALOG) 0.1 % Apply 1 application topically 2 (two) times daily as needed (for legs). 10/29/14   Tyrone Nine, MD  warfarin (COUMADIN) 5 MG tablet TAKE 1 TABLET BY MOUTH EVERY DAY 10/18/14   Levert Feinstein, MD   BP 127/67 mmHg  Pulse 60  Temp(Src) 98.3 F (36.8 C) (Oral)  Resp 20  SpO2 95%  LMP 11/22/1996 Physical Exam  Constitutional: She is oriented to person, place, and time. She appears well-developed and well-nourished. No distress.  HENT:  Head: Normocephalic and atraumatic.  Nose: Nose normal.  Mouth/Throat: Oropharynx is clear and moist. No oropharyngeal exudate.  Eyes: Conjunctivae and EOM are normal. Pupils are equal, round, and reactive to light. No scleral icterus.  Neck: Normal range of motion. Neck supple. No JVD present. No tracheal deviation present. No thyromegaly present.  Cardiovascular: Normal rate, regular rhythm and normal heart sounds.  Exam reveals no gallop and no friction  rub.   No murmur heard. Pulmonary/Chest: Effort normal and breath sounds normal. No respiratory distress. She has no wheezes. She exhibits no tenderness.  Abdominal: Soft. Bowel sounds are normal. She exhibits no distension and no mass. There is no tenderness. There is no rebound and no guarding.  Musculoskeletal: Normal range of motion. She exhibits edema. She exhibits no tenderness.  Discoloration seen to bilateral lower extremities, status post laser therapy of varicose veins. No warmth or tenderness noted. Normal pulses and sensation distally.  Lymphadenopathy:    She has no cervical adenopathy.  Neurological: She is alert and oriented to person, place, and time. No cranial nerve deficit. She exhibits normal muscle tone.  Skin: Skin is warm and dry. No rash noted. No erythema. No pallor.  Nursing note and vitals reviewed.   ED Course  Procedures (including critical care time) Labs Review Labs Reviewed - No data to display  Imaging Review No results found. I have personally reviewed and evaluated these images and lab results as part of my medical decision-making.   EKG Interpretation None      MDM   Final diagnoses:  Pruritus   patient presents to the  emergency department for evaluation for bilateral lower extremity pruritus. I do not believe this is anaphylaxis because she is not having 2 or more systems involved. EpiPen is not warranted. She was given Benadryl and Tylenol for her symptoms. She appears well and in no acute distress, vital signs were within her normal limits and she is safe for discharge.  Tomasita CrumbleAdeleke Manvir Prabhu, MD 08/12/15 [phone]

## 2015-08-20 ENCOUNTER — Emergency Department (HOSPITAL_COMMUNITY)
Admission: EM | Admit: 2015-08-20 | Discharge: 2015-08-20 | Disposition: A | Discharge status: Home / Self Care | Payer: Medicare Other | Attending: Emergency Medicine | Admitting: Emergency Medicine

## 2015-08-20 ENCOUNTER — Encounter (HOSPITAL_COMMUNITY): Payer: Self-pay

## 2015-08-20 DIAGNOSIS — G8929 Other chronic pain: Secondary | ICD-10-CM | POA: Insufficient documentation

## 2015-08-20 DIAGNOSIS — Z86718 Personal history of other venous thrombosis and embolism: Secondary | ICD-10-CM | POA: Diagnosis not present

## 2015-08-20 DIAGNOSIS — R011 Cardiac murmur, unspecified: Secondary | ICD-10-CM | POA: Diagnosis not present

## 2015-08-20 DIAGNOSIS — Z9981 Dependence on supplemental oxygen: Secondary | ICD-10-CM | POA: Diagnosis not present

## 2015-08-20 DIAGNOSIS — Z8639 Personal history of other endocrine, nutritional and metabolic disease: Secondary | ICD-10-CM | POA: Insufficient documentation

## 2015-08-20 DIAGNOSIS — H9203 Otalgia, bilateral: Secondary | ICD-10-CM | POA: Insufficient documentation

## 2015-08-20 DIAGNOSIS — Z7951 Long term (current) use of inhaled steroids: Secondary | ICD-10-CM | POA: Insufficient documentation

## 2015-08-20 DIAGNOSIS — F329 Major depressive disorder, single episode, unspecified: Secondary | ICD-10-CM | POA: Diagnosis not present

## 2015-08-20 DIAGNOSIS — I10 Essential (primary) hypertension: Secondary | ICD-10-CM | POA: Insufficient documentation

## 2015-08-20 DIAGNOSIS — R51 Headache: Secondary | ICD-10-CM | POA: Diagnosis present

## 2015-08-20 DIAGNOSIS — J45909 Unspecified asthma, uncomplicated: Secondary | ICD-10-CM | POA: Diagnosis not present

## 2015-08-20 DIAGNOSIS — F419 Anxiety disorder, unspecified: Secondary | ICD-10-CM | POA: Diagnosis not present

## 2015-08-20 DIAGNOSIS — Z872 Personal history of diseases of the skin and subcutaneous tissue: Secondary | ICD-10-CM | POA: Insufficient documentation

## 2015-08-20 DIAGNOSIS — G4730 Sleep apnea, unspecified: Secondary | ICD-10-CM | POA: Insufficient documentation

## 2015-08-20 DIAGNOSIS — Z86711 Personal history of pulmonary embolism: Secondary | ICD-10-CM | POA: Insufficient documentation

## 2015-08-20 DIAGNOSIS — G43009 Migraine without aura, not intractable, without status migrainosus: Secondary | ICD-10-CM | POA: Diagnosis not present

## 2015-08-20 DIAGNOSIS — Z79899 Other long term (current) drug therapy: Secondary | ICD-10-CM | POA: Diagnosis not present

## 2015-08-20 DIAGNOSIS — Z7901 Long term (current) use of anticoagulants: Secondary | ICD-10-CM | POA: Insufficient documentation

## 2015-08-20 DIAGNOSIS — K219 Gastro-esophageal reflux disease without esophagitis: Secondary | ICD-10-CM | POA: Diagnosis not present

## 2015-08-20 DIAGNOSIS — Z862 Personal history of diseases of the blood and blood-forming organs and certain disorders involving the immune mechanism: Secondary | ICD-10-CM | POA: Insufficient documentation

## 2015-08-20 DIAGNOSIS — M542 Cervicalgia: Secondary | ICD-10-CM | POA: Insufficient documentation

## 2015-08-20 LAB — APTT: APTT: 42 s — AB (ref 24–37)

## 2015-08-20 LAB — PROTIME-INR
INR: 2.34 — ABNORMAL HIGH (ref 0.00–1.49)
Prothrombin Time: 25.4 seconds — ABNORMAL HIGH (ref 11.6–15.2)

## 2015-08-20 MED ORDER — CYCLOBENZAPRINE HCL 10 MG PO TABS: 10.0000 mg | ORAL_TABLET | Freq: Two times a day (BID) | ORAL | Status: DC | PRN

## 2015-08-20 MED ORDER — DIPHENHYDRAMINE HCL 25 MG PO CAPS
25.0000 mg | ORAL_CAPSULE | Freq: Once | ORAL | Status: AC
  Administered 2015-08-20: 25 mg via ORAL
  Filled 2015-08-20: qty 1

## 2015-08-20 MED ORDER — DEXAMETHASONE SODIUM PHOSPHATE 10 MG/ML IJ SOLN
10.0000 mg | Freq: Once | INTRAMUSCULAR | Status: AC
  Administered 2015-08-20: 10 mg via INTRAMUSCULAR
  Filled 2015-08-20: qty 1

## 2015-08-20 MED ORDER — METOCLOPRAMIDE HCL 5 MG/ML IJ SOLN
10.0000 mg | Freq: Once | INTRAMUSCULAR | Status: AC
  Administered 2015-08-20: 10 mg via INTRAMUSCULAR
  Filled 2015-08-20: qty 2

## 2015-08-20 NOTE — ED Notes (Signed)
MD at bedside. Dr. Belfi at bedside.  

## 2015-08-20 NOTE — ED Provider Notes (Signed)
CSN: [id number]     Arrival date & time 08/20/15  1805 History  By signing my name below, I, [name] [name], attest that this documentation has been prepared under the direction and in the presence of Shair Kaeo Jacome , PA-C.  Electronically Signed: Gonzella [name], Scribe. 08/20/2015. 8:25 PM.    Chief Complaint  Patient presents with  . Otalgia     The history is provided by the patient. No language interpreter was used.    HPI Comments: [name] [name] is a 74 y.o. female who presents to the Emergency Department complaining of otalgia bilaterally onset a week ago which she describes as "sounds bother me". She also reports a throbbing HA which feels similar to her past migraines, photophobia, nausea without vomiting, and associated neck pain. Pt reports her last migraine was about two or three years ago and reports she has never had ear pain this bad before. She has tried Tylenol extra strength with no relief. She denies fever, cough, SOB, change in appetite, and vomiting. She denies taking any new medications and denies changes in the doses of her medications recently. Pt has NKDA.    Past Medical History  Diagnosis Date  . Depression   . Schizophrenia (HCC)   . Hyperlipidemia   . Hypertension   . Chronic pain     "over my whole body" (03/30/2013)  . Pulmonary embolism (HCC) 12/2009    Large central bilateral PE's   . DVT (deep venous thrombosis) (HCC)     per 01/17/10 d/c summary- "remote hx of dvt"?  . Iron deficiency anemia   . Glaucoma   . Hemorrhoids   . Asthma   . Anxiety   . GERD (gastroesophageal reflux disease)   . Psoriasis 04/29/2012    New onset evaluated by Dr Marylou Flesher, Dermatology, [facility] 6/13  Rx 0.1% Tacrolimus ointment  . Complication of anesthesia     "because I have sleep apnea" (03/30/2013)  . Heart murmur   . Chest pain, exertional   . Chronic bronchitis (HCC)   . Sleep apnea     "waiting on my CPAP" (03/30/2013)  . Pneumonia     "a few  times" (03/30/2013)  . Exertional shortness of breath     "& sometimes when laying down" (03/30/2013)  . Daily headache     "last 2 months" (03/30/2013)  . Arthritis     "joints" (03/30/2013)  . Varicose veins of legs   . Psoriasis    Past Surgical History  Procedure Laterality Date  . Spinal fusion      2004  . Carpal tunnel release Bilateral   . Tubal ligation  1973  . Dilation and curettage of uterus  1970's    "once" (03/30/2013)  . 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 Joint replacement    . Vein surgery  left leg   Family History  Problem Relation Age of Onset  . Diabetes Sister   . Colon cancer Brother 40  . Colon cancer Brother    Social History  Substance Use Topics  . Smoking status: Never Smoker   . Smokeless tobacco: Never Used  . Alcohol Use: No   OB History    No data available     Review of Systems  Constitutional: Negative for fever and appetite change.  HENT: Positive for ear pain.   Eyes: Positive for photophobia.  Respiratory: Negative for cough and shortness of breath.   Gastrointestinal: Positive for nausea. Negative for vomiting.  Musculoskeletal: Positive for neck pain.  Neurological: Positive for headaches.      Allergies  Review of patient's allergies indicates no known allergies.  Home Medications   Prior to Admission medications   Medication Sig Start Date End Date Taking? Authorizing Provider  albuterol (PROVENTIL HFA;VENTOLIN HFA) 108 (90 BASE) MCG/ACT inhaler Inhale 2 puffs into the lungs every 6 (six) hours as needed for wheezing or shortness of breath (wheezing). 12/08/14   Tyrone Nine, MD  amLODipine (NORVASC) 10 MG tablet Take 10 mg by mouth daily. 04/18/15   Historical Provider, MD  atenolol (TENORMIN) 100 MG tablet TAKE 1 TABLET BY MOUTH EVERY DAY 02/20/15   Tyrone Nine, MD  cycloSPORINE (RESTASIS) 0.05 % ophthalmic emulsion Place 2 drops into both eyes daily as needed (for dry eyes).    Historical Provider, MD  esomeprazole  (NEXIUM) 40 MG capsule Take 1 capsule (40 mg total) by mouth daily. 06/18/15   Mady Gemma, PA-C  FLUoxetine (PROZAC) 20 MG capsule Take 3 capsules (60 mg total) by mouth daily. Patient taking differently: Take 20 mg by mouth daily.  05/26/14   Tyrone Nine, MD  Fluticasone Propionate, Inhal, (FLOVENT DISKUS) 250 MCG/BLIST AEPB Inhale 2 puffs into the lungs every 4 (four) hours as needed (for shortness of breath and coughing).    Historical Provider, MD  furosemide (LASIX) 20 MG tablet Take 1 tablet (20 mg total) by mouth daily. 10/29/14   Tyrone Nine, MD  gabapentin (NEURONTIN) 300 MG capsule Take 300 mg by mouth 3 (three) times daily.  05/07/15   Historical Provider, MD  hydrOXYzine (ATARAX/VISTARIL) 10 MG tablet Take 1 tablet (10 mg total) by mouth every 6 (six) hours as needed for itching (itching). Patient not taking: Reported on 08/01/2015 05/11/15   Paula Libra, MD  lisinopril (PRINIVIL,ZESTRIL) 20 MG tablet Take 1 tablet (20 mg total) by mouth daily. 12/08/14   Tyrone Nine, MD  meclizine (ANTIVERT) 25 MG tablet Take 1 tablet (25 mg total) by mouth 3 (three) times daily as needed for dizziness. 02/01/15   Arby Barrette, MD  silver sulfADIAZINE (SILVADENE) 1 % cream APPLY TOPICALLY EVERY DAY Patient not taking: Reported on 07/03/2015 12/10/14   Tyrone Nine, MD  traMADol (ULTRAM) 50 MG tablet Take 1 tablet (50 mg total) by mouth every 6 (six) hours as needed. 06/12/15   Emilia Beck, PA-C  triamcinolone cream (KENALOG) 0.1 % Apply 1 application topically 2 (two) times daily as needed (for legs). 10/29/14   Tyrone Nine, MD  warfarin (COUMADIN) 5 MG tablet TAKE 1 TABLET BY MOUTH EVERY DAY 10/18/14   Levert Feinstein, MD   BP 154/87 mmHg  Pulse 63  Temp(Src) 98.3 F (36.8 C) (Oral)  Resp 16  SpO2 98%  LMP 11/22/1996 Physical Exam  Constitutional: She is oriented to person, place, and time. She appears well-developed and well-nourished. No distress.  HENT:  Head: Normocephalic.   Eyes: Conjunctivae are normal.  Cardiovascular: Normal rate, regular rhythm, normal heart sounds and intact distal pulses.   Pulmonary/Chest: Effort normal and breath sounds normal.  Abdominal: She exhibits no distension.  Neurological: She is alert and oriented to person, place, and time.  Cranial nerves III through XII intact No deficits of coordniation, finger to nose. Speech is clear and focused Ambulatory without imbalance  Skin: Skin is warm and dry.  Psychiatric: She has a normal mood and affect.  Nursing note and vitals reviewed.   ED Course  Procedures  DIAGNOSTIC STUDIES:    Oxygen Saturation is 98% on RA, normal by my interpretation.   COORDINATION OF CARE:  8:10 PM Will administer metoclopramide injection to pt in the ED. Will prescribe pt refill of her 10mg  Flexeril. Discussed treatment plan with pt at bedside and pt agreed to plan.    MDM   Final diagnoses:  None    1. Migraine Headache  The patient was given Reglan, Benadryl and Decadron with complete relief of the headache. She is ambulatory after medications and is steady. She is examined by Dr. Fredderick PhenixBelfi.  Headache x one week, similar to previous migraines with typical symptoms. No injury. Patient on coumadin and therapeutic. Neurologically intact without deficit. Doubt intracranial bleed or infection. Stable for discharge.   I personally performed the services described in this documentation, which was scribed in my presence. The recorded information has been reviewed and is accurate.     Elpidio AnisShari Yaroslav Gombos, PA-C 08/21/15 [phone]  Rolan BuccoMelanie Belfi, MD 08/21/15 [phone]

## 2015-08-20 NOTE — Discharge Instructions (Signed)

## 2015-08-20 NOTE — ED Notes (Signed)
Pt presents from home via EMS with c/o otalgia, bilateral. Pt reports her pain has been going on for a week. Pt also c/o need for arthritis medication. Pt reported to EMS that she was going to go to UC but then decided against that plan.

## 2015-08-23 ENCOUNTER — Other Ambulatory Visit: Payer: Self-pay | Admitting: Family Medicine

## 2015-08-27 ENCOUNTER — Emergency Department (HOSPITAL_COMMUNITY): Payer: Medicare Other

## 2015-08-27 ENCOUNTER — Emergency Department (HOSPITAL_COMMUNITY)
Admission: EM | Admit: 2015-08-27 | Discharge: 2015-08-28 | Disposition: A | Discharge status: Home / Self Care | Payer: Medicare Other | Attending: Emergency Medicine | Admitting: Emergency Medicine

## 2015-08-27 ENCOUNTER — Encounter (HOSPITAL_COMMUNITY): Payer: Self-pay | Admitting: Emergency Medicine

## 2015-08-27 DIAGNOSIS — G8929 Other chronic pain: Secondary | ICD-10-CM | POA: Diagnosis not present

## 2015-08-27 DIAGNOSIS — M1990 Unspecified osteoarthritis, unspecified site: Secondary | ICD-10-CM | POA: Diagnosis not present

## 2015-08-27 DIAGNOSIS — F419 Anxiety disorder, unspecified: Secondary | ICD-10-CM | POA: Diagnosis present

## 2015-08-27 DIAGNOSIS — R519 Headache, unspecified: Secondary | ICD-10-CM

## 2015-08-27 DIAGNOSIS — F209 Schizophrenia, unspecified: Secondary | ICD-10-CM | POA: Insufficient documentation

## 2015-08-27 DIAGNOSIS — Z8701 Personal history of pneumonia (recurrent): Secondary | ICD-10-CM | POA: Diagnosis not present

## 2015-08-27 DIAGNOSIS — Z8669 Personal history of other diseases of the nervous system and sense organs: Secondary | ICD-10-CM | POA: Diagnosis not present

## 2015-08-27 DIAGNOSIS — R51 Headache: Secondary | ICD-10-CM | POA: Diagnosis not present

## 2015-08-27 DIAGNOSIS — I10 Essential (primary) hypertension: Secondary | ICD-10-CM | POA: Diagnosis not present

## 2015-08-27 DIAGNOSIS — Z7951 Long term (current) use of inhaled steroids: Secondary | ICD-10-CM | POA: Insufficient documentation

## 2015-08-27 DIAGNOSIS — K219 Gastro-esophageal reflux disease without esophagitis: Secondary | ICD-10-CM | POA: Diagnosis not present

## 2015-08-27 DIAGNOSIS — Z872 Personal history of diseases of the skin and subcutaneous tissue: Secondary | ICD-10-CM | POA: Diagnosis not present

## 2015-08-27 DIAGNOSIS — Z79899 Other long term (current) drug therapy: Secondary | ICD-10-CM | POA: Diagnosis not present

## 2015-08-27 DIAGNOSIS — Z86718 Personal history of other venous thrombosis and embolism: Secondary | ICD-10-CM | POA: Insufficient documentation

## 2015-08-27 DIAGNOSIS — F329 Major depressive disorder, single episode, unspecified: Secondary | ICD-10-CM | POA: Insufficient documentation

## 2015-08-27 DIAGNOSIS — Z7901 Long term (current) use of anticoagulants: Secondary | ICD-10-CM | POA: Insufficient documentation

## 2015-08-27 DIAGNOSIS — J45909 Unspecified asthma, uncomplicated: Secondary | ICD-10-CM | POA: Diagnosis not present

## 2015-08-27 DIAGNOSIS — R011 Cardiac murmur, unspecified: Secondary | ICD-10-CM | POA: Diagnosis not present

## 2015-08-27 DIAGNOSIS — Z86711 Personal history of pulmonary embolism: Secondary | ICD-10-CM | POA: Insufficient documentation

## 2015-08-27 DIAGNOSIS — Z862 Personal history of diseases of the blood and blood-forming organs and certain disorders involving the immune mechanism: Secondary | ICD-10-CM | POA: Diagnosis not present

## 2015-08-27 MED ORDER — DEXAMETHASONE SODIUM PHOSPHATE 10 MG/ML IJ SOLN
10.0000 mg | Freq: Once | INTRAMUSCULAR | Status: AC
  Administered 2015-08-28: 10 mg via INTRAVENOUS
  Filled 2015-08-27: qty 1

## 2015-08-27 MED ORDER — METOCLOPRAMIDE HCL 10 MG PO TABS
5.0000 mg | ORAL_TABLET | Freq: Once | ORAL | Status: AC
  Administered 2015-08-28: 5 mg via ORAL
  Filled 2015-08-27: qty 1

## 2015-08-27 MED ORDER — DIPHENHYDRAMINE HCL 25 MG PO CAPS
25.0000 mg | ORAL_CAPSULE | Freq: Once | ORAL | Status: AC
  Administered 2015-08-28: 25 mg via ORAL
  Filled 2015-08-27: qty 1

## 2015-08-27 MED ORDER — DEXAMETHASONE SODIUM PHOSPHATE 4 MG/ML IJ SOLN: 4.0000 mg | Freq: Once | INTRAMUSCULAR | Status: DC

## 2015-08-27 NOTE — ED Provider Notes (Signed)
CSN: [id number]     Arrival date & time 08/27/15  2150 History   First MD Initiated Contact with Patient 08/27/15 2248     Chief Complaint  Patient presents with  . Anxiety     (Consider location/radiation/quality/duration/timing/severity/associated sxs/prior Treatment) HPI   [name] [name] is a 74 y.o. female with PMH significant for depression, schizophrenia, HTN, chronic pain, PE, anxiety, GERD, sleep apnea,  who presents with gradual onset, constant, worsening anxiety for the past 2 weeks.  Patient extensively speaks about family issues concerning her husband's medical issues, son's medical issues, and other family issues that she has been dealing with recently. She also complains of chronic headaches that she has been experiencing since Thanksgiving as well as intermittent nausea and SOB.  Denies neck stiffness, CP, vomiting, abdominal pain, urinary frequency, fever, weakness, slurred speech, or facial droop. No SI or HI.  No injury, fall, or trauma.    Past Medical History  Diagnosis Date  . Depression   . Schizophrenia (HCC)   . Hyperlipidemia   . Hypertension   . Chronic pain     "over my whole body" (03/30/2013)  . Pulmonary embolism (HCC) 12/2009    Large central bilateral PE's   . DVT (deep venous thrombosis) (HCC)     per 01/17/10 d/c summary- "remote hx of dvt"?  . Iron deficiency anemia   . Glaucoma   . Hemorrhoids   . Asthma   . Anxiety   . GERD (gastroesophageal reflux disease)   . Psoriasis 04/29/2012    New onset evaluated [facility] Dr Marylou FlesherWilliam Huang, [facility]  Rx 0.1% Tacrolimus ointment  . Complication of anesthesia     "because I have sleep apnea" (03/30/2013)  . Heart murmur   . Chest pain, exertional   . Chronic bronchitis (HCC)   . Sleep apnea     "waiting on my CPAP" (03/30/2013)  . Pneumonia     "a few times" (03/30/2013)  . Exertional shortness of breath     "& sometimes when laying down" (03/30/2013)  . Daily headache     "last 2 months"  (03/30/2013)  . Arthritis     "joints" (03/30/2013)  . Varicose veins of legs   . Psoriasis    Past Surgical History  Procedure Laterality Date  . Spinal fusion      2004  . Carpal tunnel release Bilateral   . Tubal ligation  1973  . Dilation and curettage of uterus  1970's    "once" (03/30/2013)  . 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 Joint replacement    . Vein surgery  left leg   Family History  Problem Relation Age of Onset  . Diabetes Sister   . Colon cancer Brother 40  . Colon cancer Brother    Social History  Substance Use Topics  . Smoking status: Never Smoker   . Smokeless tobacco: Never Used  . Alcohol Use: No   OB History    No data available     Review of Systems All other systems negative unless otherwise stated in HPI    Allergies  Review of patient's allergies indicates no known allergies.  Home Medications   Prior to Admission medications   Medication Sig Start Date End Date Taking? Authorizing Provider  albuterol (PROVENTIL HFA;VENTOLIN HFA) 108 (90 BASE) MCG/ACT inhaler Inhale 2 puffs into the lungs every 6 (six) hours as needed for wheezing or shortness of breath (wheezing). 12/08/14  Yes Tyrone Nineyan B Grunz, MD  amLODipine (NORVASC) 10 MG tablet Take 10 mg by mouth daily. 04/18/15  Yes Historical Provider, MD  atenolol (TENORMIN) 100 MG tablet TAKE 1 TABLET BY MOUTH EVERY DAY 02/20/15  Yes Tyrone Nine, MD  cyclobenzaprine (FLEXERIL) 10 MG tablet Take 1 tablet (10 mg total) by mouth 2 (two) times daily as needed for muscle spasms. 08/20/15  Yes Shari Upstill, PA-C  esomeprazole (NEXIUM) 40 MG capsule Take 1 capsule (40 mg total) by mouth daily. 06/18/15  Yes Mady Gemma, PA-C  FLUoxetine (PROZAC) 10 MG capsule Take 10 mg by mouth 3 (three) times daily.   Yes Historical Provider, MD  Fluticasone Propionate, Inhal, (FLOVENT DISKUS) 250 MCG/BLIST AEPB Inhale 2 puffs into the lungs every 4 (four) hours as needed (for shortness of breath and coughing).    Yes Historical Provider, MD  furosemide (LASIX) 20 MG tablet Take 1 tablet (20 mg total) by mouth daily. 10/29/14  Yes Tyrone Nine, MD  meclizine (ANTIVERT) 25 MG tablet Take 1 tablet (25 mg total) by mouth 3 (three) times daily as needed for dizziness. 02/01/15  Yes Arby Barrette, MD  mometasone [facility]) 220 MCG/INH inhaler Inhale 2 puffs into the lungs daily.   Yes Historical Provider, MD  silver sulfADIAZINE (SILVADENE) 1 % cream APPLY TOPICALLY EVERY DAY 12/10/14  Yes Tyrone Nine, MD  traMADol (ULTRAM) 50 MG tablet Take 1 tablet (50 mg total) by mouth every 6 (six) hours as needed. Patient taking differently: Take 50 mg by mouth every 6 (six) hours as needed for moderate pain.  06/12/15  Yes Kaitlyn Szekalski, PA-C  triamcinolone cream (KENALOG) 0.1 % Apply 1 application topically 2 (two) times daily as needed (for legs). 10/29/14  Yes Tyrone Nine, MD  warfarin (COUMADIN) 5 MG tablet TAKE 1 TABLET BY MOUTH EVERY DAY 10/18/14  Yes Levert Feinstein, MD  acetaminophen (TYLENOL) 500 MG tablet Take 1 tablet (500 mg total) by mouth every 6 (six) hours as needed. 08/28/15   Cheri Fowler, PA-C  FLUoxetine (PROZAC) 20 MG capsule Take 3 capsules (60 mg total) by mouth daily. Patient taking differently: Take 20 mg by mouth daily.  05/26/14   Tyrone Nine, MD  gabapentin (NEURONTIN) 300 MG capsule Take 1 capsule (300 mg total) by mouth 3 (three) times daily. 08/28/15   Cheri Fowler, PA-C  hydrOXYzine (ATARAX/VISTARIL) 10 MG tablet Take 1 tablet (10 mg total) by mouth every 6 (six) hours as needed for itching (itching). Patient not taking: Reported on 08/01/2015 05/11/15   Paula Libra, MD  lisinopril (PRINIVIL,ZESTRIL) 20 MG tablet Take 1 tablet (20 mg total) by mouth daily. Patient not taking: Reported on 08/28/2015 12/08/14   Tyrone Nine, MD   BP 136/79 mmHg  Pulse 74  Temp(Src) 98.4 F (36.9 C) (Oral)  Resp 18  SpO2 96%  LMP 11/22/1996 Physical Exam  Constitutional: She is oriented to person, place, and  time. She appears well-developed and well-nourished.  HENT:  Head: Normocephalic and atraumatic.  Mouth/Throat: Oropharynx is clear and moist.  Eyes: Conjunctivae are normal. Pupils are equal, round, and reactive to light.  Neck: Normal range of motion. Neck supple.  Cardiovascular: Normal rate, regular rhythm and normal heart sounds.   No murmur heard. Pulmonary/Chest: Effort normal and breath sounds normal. No accessory muscle usage or stridor. No respiratory distress. She has no wheezes. She has no rhonchi. She has no rales.  Abdominal: Soft. Bowel sounds are normal. She exhibits no distension. There is no tenderness.  Musculoskeletal: Normal range of motion.  Lymphadenopathy:    She has no cervical adenopathy.  Neurological: She is alert and oriented to person, place, and time.  Speech clear without dysarthria.  Cranial nerves grossly intact.  Finger to nose intact.  No pronator drift. Strength and sensation intact bilaterally throughout upper and lower extremities.  Gait normal without ataxia.    Skin: Skin is warm and dry.  Psychiatric: She has a normal mood and affect. Her behavior is normal.    ED Course  Procedures (including critical care time) Labs Review Labs Reviewed  CBC WITH DIFFERENTIAL/PLATELET - Abnormal; Notable for the following:    RBC 3.37 (*)    Hemoglobin 10.5 (*)    HCT 31.3 (*)    All other components within normal limits  BASIC METABOLIC PANEL - Abnormal; Notable for the following:    Potassium 3.4 (*)    Glucose, Bld 109 (*)    All other components within normal limits  PROTIME-INR - Abnormal; Notable for the following:    Prothrombin Time 23.3 (*)    INR 2.09 (*)    All other components within normal limits    Imaging Review Dg Chest 2 View  08/28/2015  CLINICAL DATA:  Feeling of impending doom. History of hypertension, pulmonary embolus, asthma. EXAM: CHEST  2 VIEW COMPARISON:  08/02/2015 FINDINGS: Cardiomediastinal silhouette is normal.  Mediastinal contours appear intact. There is no evidence of focal airspace consolidation, pleural effusion or pneumothorax. Lungs are with low volume. Osseous structures are without acute abnormality. Soft tissues are grossly normal. IMPRESSION: Low lung volumes otherwise no evidence of acute cardiopulmonary disease. Electronically Signed   By: Ted Mcalpine M.D.   On: 08/28/2015 00:02   I have personally reviewed and evaluated these images and lab results as part of my medical decision-making.   EKG Interpretation None      MDM   Final diagnoses:  Nonintractable headache, unspecified chronicity pattern, unspecified headache type  Anxiety    Patient presents with anxiety and headache for the past 2 weeks.  No SI or HI. No trauma or injury.  VSS, NAD.  On exam, heart RRR, lungs CTAB, abdomen soft and nontender.  No focal neurological deficits.  Patient ambulatory shift without difficulty.  Will obtain CXR, BMP, and CBC.  Will give reglan, decadron, and benadryl for headache.  This is consistent with her typical migraines.  No red flags.  No indication for imaging.  Doubt intracranial bleed.  Doubt infection.  Doubt mass lesion. Will give migraine cocktail.  CXR shows no evidence of acute cardiopulmonary disease. BMP unremarable. CBC shows hgb 10.5, appear to be baseline PT/INR therapeutic at 2.09.  Upon reassessment, patient's pain improved.  Evaluation does not show pathology requring ongoing emergent intervention or admission. Pt is hemodynamically stable and mentating appropriately. Discussed findings/results and plan with patient/guardian, who agrees with plan. All questions answered. Return precautions discussed and outpatient follow up given.   Case has been discussed with Dr. Blinda Leatherwood who agrees with the above plan for discharge.      Cheri Fowler, PA-C 08/28/15 0155  Gilda Crease, MD 08/28/15 [phone]

## 2015-08-27 NOTE — ED Notes (Signed)
Pt talks at length about family issues including her husband's medical problems, her son's epilepsy, arguments with her daughter, and additional problems with three sisters in law.  She reports lapses in memory and occasional feelings of impending doom x 2 weeks.  She is currently in no apparent distress.

## 2015-08-27 NOTE — ED Notes (Signed)
[facility] c/o anxiety from being told her husband can be DC from hospital and will need help at home. She reports that she doesn't want to be left alone. She reports a headache since 11/26.

## 2015-08-28 DIAGNOSIS — F419 Anxiety disorder, unspecified: Secondary | ICD-10-CM | POA: Diagnosis not present

## 2015-08-28 LAB — BASIC METABOLIC PANEL
Anion gap: 5 (ref 5–15)
BUN: 11 mg/dL (ref 6–20)
CHLORIDE: 103 mmol/L (ref 101–111)
CO2: 30 mmol/L (ref 22–32)
Calcium: 9.2 mg/dL (ref 8.9–10.3)
Creatinine, Ser: 0.78 mg/dL (ref 0.44–1.00)
GFR calc Af Amer: >60 mL/min (ref >60)
GFR calc non Af Amer: >60 mL/min (ref >60)
GLUCOSE: 109 mg/dL — AB (ref 65–99)
POTASSIUM: 3.4 mmol/L — AB (ref 3.5–5.1)
Sodium: 138 mmol/L (ref 135–145)

## 2015-08-28 LAB — CBC WITH DIFFERENTIAL/PLATELET
Basophils Absolute: 0 10*3/uL (ref 0.0–0.1)
Basophils Relative: 0 %
EOS PCT: 4 %
Eosinophils Absolute: 0.3 10*3/uL (ref 0.0–0.7)
HCT: 31.3 % — ABNORMAL LOW (ref 36.0–46.0)
HEMOGLOBIN: 10.5 g/dL — AB (ref 12.0–15.0)
LYMPHS ABS: 1.4 10*3/uL (ref 0.7–4.0)
LYMPHS PCT: 21 %
MCH: 31.2 pg (ref 26.0–34.0)
MCHC: 33.5 g/dL (ref 30.0–36.0)
MCV: 92.9 fL (ref 78.0–100.0)
Monocytes Absolute: 0.4 10*3/uL (ref 0.1–1.0)
Monocytes Relative: 7 %
NEUTROS PCT: 68 %
Neutro Abs: 4.6 10*3/uL (ref 1.7–7.7)
Platelets: 224 10*3/uL (ref 150–400)
RBC: 3.37 MIL/uL — AB (ref 3.87–5.11)
RDW: 14.6 % (ref 11.5–15.5)
WBC: 6.8 10*3/uL (ref 4.0–10.5)

## 2015-08-28 LAB — PROTIME-INR
INR: 2.09 — ABNORMAL HIGH (ref 0.00–1.49)
PROTHROMBIN TIME: 23.3 s — AB (ref 11.6–15.2)

## 2015-08-28 MED ORDER — ACETAMINOPHEN 500 MG PO TABS: 500.0000 mg | ORAL_TABLET | Freq: Four times a day (QID) | ORAL | Status: DC | PRN

## 2015-08-28 MED ORDER — KETOROLAC TROMETHAMINE 30 MG/ML IJ SOLN
30.0000 mg | Freq: Once | INTRAMUSCULAR | Status: AC
  Administered 2015-08-28: 30 mg via INTRAVENOUS
  Filled 2015-08-28: qty 1

## 2015-08-28 MED ORDER — GABAPENTIN 300 MG PO CAPS: 300.0000 mg | ORAL_CAPSULE | Freq: Three times a day (TID) | ORAL | Status: DC

## 2015-08-28 NOTE — Discharge Instructions (Signed)

## 2015-09-07 ENCOUNTER — Emergency Department (HOSPITAL_COMMUNITY)
Admission: EM | Admit: 2015-09-07 | Discharge: 2015-09-07 | Disposition: A | Discharge status: Home / Self Care | Payer: Medicare Other | Attending: Emergency Medicine | Admitting: Emergency Medicine

## 2015-09-07 ENCOUNTER — Emergency Department (HOSPITAL_COMMUNITY): Payer: Medicare Other

## 2015-09-07 ENCOUNTER — Encounter (HOSPITAL_COMMUNITY): Payer: Self-pay | Admitting: Emergency Medicine

## 2015-09-07 DIAGNOSIS — R011 Cardiac murmur, unspecified: Secondary | ICD-10-CM | POA: Insufficient documentation

## 2015-09-07 DIAGNOSIS — Z8673 Personal history of transient ischemic attack (TIA), and cerebral infarction without residual deficits: Secondary | ICD-10-CM | POA: Insufficient documentation

## 2015-09-07 DIAGNOSIS — Z7952 Long term (current) use of systemic steroids: Secondary | ICD-10-CM | POA: Insufficient documentation

## 2015-09-07 DIAGNOSIS — K219 Gastro-esophageal reflux disease without esophagitis: Secondary | ICD-10-CM | POA: Diagnosis not present

## 2015-09-07 DIAGNOSIS — J45909 Unspecified asthma, uncomplicated: Secondary | ICD-10-CM | POA: Diagnosis not present

## 2015-09-07 DIAGNOSIS — F329 Major depressive disorder, single episode, unspecified: Secondary | ICD-10-CM | POA: Insufficient documentation

## 2015-09-07 DIAGNOSIS — M1990 Unspecified osteoarthritis, unspecified site: Secondary | ICD-10-CM | POA: Insufficient documentation

## 2015-09-07 DIAGNOSIS — F419 Anxiety disorder, unspecified: Secondary | ICD-10-CM | POA: Diagnosis not present

## 2015-09-07 DIAGNOSIS — Z7951 Long term (current) use of inhaled steroids: Secondary | ICD-10-CM | POA: Diagnosis not present

## 2015-09-07 DIAGNOSIS — Z86711 Personal history of pulmonary embolism: Secondary | ICD-10-CM | POA: Insufficient documentation

## 2015-09-07 DIAGNOSIS — R519 Headache, unspecified: Secondary | ICD-10-CM

## 2015-09-07 DIAGNOSIS — Z8701 Personal history of pneumonia (recurrent): Secondary | ICD-10-CM | POA: Insufficient documentation

## 2015-09-07 DIAGNOSIS — Z8639 Personal history of other endocrine, nutritional and metabolic disease: Secondary | ICD-10-CM | POA: Diagnosis not present

## 2015-09-07 DIAGNOSIS — G8929 Other chronic pain: Secondary | ICD-10-CM | POA: Insufficient documentation

## 2015-09-07 DIAGNOSIS — R51 Headache: Secondary | ICD-10-CM | POA: Diagnosis not present

## 2015-09-07 DIAGNOSIS — R42 Dizziness and giddiness: Secondary | ICD-10-CM | POA: Diagnosis not present

## 2015-09-07 DIAGNOSIS — Z79899 Other long term (current) drug therapy: Secondary | ICD-10-CM | POA: Insufficient documentation

## 2015-09-07 DIAGNOSIS — I10 Essential (primary) hypertension: Secondary | ICD-10-CM | POA: Insufficient documentation

## 2015-09-07 DIAGNOSIS — Z86718 Personal history of other venous thrombosis and embolism: Secondary | ICD-10-CM | POA: Diagnosis not present

## 2015-09-07 DIAGNOSIS — Z7901 Long term (current) use of anticoagulants: Secondary | ICD-10-CM | POA: Insufficient documentation

## 2015-09-07 DIAGNOSIS — Z872 Personal history of diseases of the skin and subcutaneous tissue: Secondary | ICD-10-CM | POA: Diagnosis not present

## 2015-09-07 LAB — BASIC METABOLIC PANEL
Anion gap: 7 (ref 5–15)
BUN: 8 mg/dL (ref 6–20)
CALCIUM: 9.4 mg/dL (ref 8.9–10.3)
CO2: 27 mmol/L (ref 22–32)
CREATININE: 0.91 mg/dL (ref 0.44–1.00)
Chloride: 104 mmol/L (ref 101–111)
GFR calc non Af Amer: >60 mL/min (ref >60)
GLUCOSE: 120 mg/dL — AB (ref 65–99)
Potassium: 3.8 mmol/L (ref 3.5–5.1)
Sodium: 138 mmol/L (ref 135–145)

## 2015-09-07 LAB — CBC
HEMATOCRIT: 35.9 % — AB (ref 36.0–46.0)
Hemoglobin: 11.7 g/dL — ABNORMAL LOW (ref 12.0–15.0)
MCH: 30.6 pg (ref 26.0–34.0)
MCHC: 32.6 g/dL (ref 30.0–36.0)
MCV: 94 fL (ref 78.0–100.0)
Platelets: 287 10*3/uL (ref 150–400)
RBC: 3.82 MIL/uL — ABNORMAL LOW (ref 3.87–5.11)
RDW: 14.5 % (ref 11.5–15.5)
WBC: 6.7 10*3/uL (ref 4.0–10.5)

## 2015-09-07 LAB — I-STAT TROPONIN, ED: Troponin i, poc: 0 ng/mL (ref 0.00–0.08)

## 2015-09-07 MED ORDER — ESOMEPRAZOLE MAGNESIUM 40 MG PO CPDR: 40.0000 mg | DELAYED_RELEASE_CAPSULE | Freq: Every day | ORAL | Status: DC

## 2015-09-07 MED ORDER — MECLIZINE HCL 25 MG PO TABS
25.0000 mg | ORAL_TABLET | Freq: Once | ORAL | Status: AC
Start: 2015-09-07 — End: 2015-09-07
  Administered 2015-09-07: 25 mg via ORAL
  Filled 2015-09-07: qty 1

## 2015-09-07 NOTE — Discharge Instructions (Signed)
Arthritis Arthritis is a term that is commonly used to refer to joint pain or joint disease. There are more than 100 types of arthritis. CAUSES The most common cause of this condition is wear and tear of a joint. Other causes include:  Gout.  Inflammation of a joint.  An infection of a joint.  Sprains and other injuries near the joint.  A drug reaction or allergic reaction. In some cases, the cause may not be known. SYMPTOMS The main symptom of this condition is pain in the joint with movement. Other symptoms include:  Redness, swelling, or stiffness at a joint.  Warmth coming from the joint.  Fever.  Overall feeling of illness. DIAGNOSIS This condition may be diagnosed with a physical exam and tests, including:  Blood tests.  Urine tests.  Imaging tests, such as MRI, X-rays, or a CT scan. Sometimes, fluid is removed from a joint for testing. TREATMENT Treatment for this condition may involve:  Treatment of the cause, if it is known.  Rest.  Raising (elevating) the joint.  Applying cold or hot packs to the joint.  Medicines to improve symptoms and reduce inflammation.  Injections of a steroid such as cortisone into the joint to help reduce pain and inflammation. Depending on the cause of your arthritis, you may need to make lifestyle changes to reduce stress on your joint. These changes may include exercising more and losing weight. HOME CARE INSTRUCTIONS Medicines  Take over-the-counter and prescription medicines only as told by your health care provider.  Do not take aspirin to relieve pain if gout is suspected. Activities  Rest your joint if told by your health care provider. Rest is important when your disease is active and your joint feels painful, swollen, or stiff.  Avoid activities that make the pain worse. It is important to balance activity with rest.  Exercise your joint regularly with range-of-motion exercises as told by your health care  provider. Try doing low-impact exercise, such as:  Swimming.  Water aerobics.  Biking.  Walking. Joint Care  If your joint is swollen, keep it elevated if told by your health care provider.  If your joint feels stiff in the morning, try taking a warm shower.  If directed, apply heat to the joint. If you have diabetes, do not apply heat without permission from your health care provider.  Put a towel between the joint and the hot pack or heating pad.  Leave the heat on the area for 20-30 minutes.  If directed, apply ice to the joint:  Put ice in a plastic bag.  Place a towel between your skin and the bag.  Leave the ice on for 20 minutes, 2-3 times per day.  Keep all follow-up visits as told by your health care provider. This is important. SEEK MEDICAL CARE IF:  The pain gets worse.  You have a fever. SEEK IMMEDIATE MEDICAL CARE IF:  You develop severe joint pain, swelling, or redness.  Many joints become painful and swollen.  You develop severe back pain.  You develop severe weakness in your leg.  You cannot control your bladder or bowels.   This information is not intended to replace advice given to you by your health care provider. Make sure you discuss any questions you have with your health care provider.   Document Released: 10/18/2004 Document Revised: 06/01/2015 Document Reviewed: 12/06/2014 Elsevier Interactive Patient Education 2016 Else[employer] Inc[employer] Dizziness Dizziness is a common problem. It is a feeling of unsteadiness or light-headedness. You  may feel like you are about to faint. Dizziness can lead to injury if you stumble or fall. Anyone can become dizzy, but dizziness is more common in older adults. This condition can be caused by a number of things, including medicines, dehydration, or illness. HOME CARE INSTRUCTIONS Taking these steps may help with your condition: Eating and Drinking  Drink enough fluid to keep your urine clear or pale yellow.  This helps to keep you from becoming dehydrated. Try to drink more clear fluids, such as water.  Do not drink alcohol.  Limit your caffeine intake if directed by your health care provider.  Limit your salt intake if directed by your health care provider. Activity  Avoid making quick movements.  Rise slowly from chairs and steady yourself until you feel okay.  In the morning, first sit up on the side of the bed. When you feel okay, stand slowly while you hold onto something until you know that your balance is fine.  Move your legs often if you need to stand in one place for a long time. Tighten and relax your muscles in your legs while you are standing.  Do not drive or operate heavy machinery if you feel dizzy.  Avoid bending down if you feel dizzy. Place items in your home so that they are easy for you to reach without leaning over. Lifestyle  Do not use any tobacco products, including cigarettes, chewing tobacco, or electronic cigarettes. If you need help quitting, ask your health care provider.  Try to reduce your stress level, such as with yoga or meditation. Talk with your health care provider if you need help. General Instructions  Watch your dizziness for any changes.  Take medicines only as directed by your health care provider. Talk with your health care provider if you think that your dizziness is caused by a medicine that you are taking.  Tell a friend or a family member that you are feeling dizzy. If he or she notices any changes in your behavior, have this person call your health care provider.  Keep all follow-up visits as directed by your health care provider. This is important. SEEK MEDICAL CARE IF:  Your dizziness does not go away.  Your dizziness or light-headedness gets worse.  You feel nauseous.  You have reduced hearing.  You have new symptoms.  You are unsteady on your feet or you feel like the room is spinning. SEEK IMMEDIATE MEDICAL CARE  IF:  You vomit or have diarrhea and are unable to eat or drink anything.  You have problems talking, walking, swallowing, or using your arms, hands, or legs.  You feel generally weak.  You are not thinking clearly or you have trouble forming sentences. It may take a friend or family member to notice this.  You have chest pain, abdominal pain, shortness of breath, or sweating.  Your vision changes.  You notice any bleeding.  You have a headache.  You have neck pain or a stiff neck.  You have a fever.   This information is not intended to replace advice given to you by your health care provider. Make sure you discuss any questions you have with your health care provider.   Document Released: 03/06/2001 Document Revised: 01/25/2015 Document Reviewed: 09/06/2014 Elsevier Interactive Patient Education [employer]

## 2015-09-07 NOTE — ED Notes (Signed)
Pt called EMS woke up at midnight states she had a headache. Pt also states that she has had this headache for a month. Pt walked to ambulance. Unable to describe headache. Pt wanted to use w/c to go from the ambulance to the ER.

## 2015-09-07 NOTE — ED Provider Notes (Signed)
CSN: [id number]     Arrival date & time 09/07/15  0457 History   First MD Initiated Contact with Patient 09/07/15 0559     Chief Complaint  Patient presents with  . Headache     (Consider location/radiation/quality/duration/timing/severity/associated sxs/prior Treatment) HPI Comments: Patient presents to the ED with a chief complaint of headache and dizziness.  She states that she has been having the headache for the past month.  She states that when she first gets up she feels dizzy.  She denies any numbness, weakness, or tingling.  States that she has difficulty remembering things.  Patient also complains of arthritis in joints x years.  There are no other associated symptoms.    The history is provided by the patient. No language interpreter was used.    Past Medical History  Diagnosis Date  . Depression   . Schizophrenia (HCC)   . Hyperlipidemia   . Hypertension   . Chronic pain     "over my whole body" (03/30/2013)  . Pulmonary embolism (HCC) 12/2009    Large central bilateral PE's   . DVT (deep venous thrombosis) (HCC)     per 01/17/10 d/c summary- "remote hx of dvt"?  . Iron deficiency anemia   . Glaucoma   . Hemorrhoids   . Asthma   . Anxiety   . GERD (gastroesophageal reflux disease)   . Psoriasis 04/29/2012    New onset evaluated by Dr Marylou Flesher, Dermatology, [facility] 6/13  Rx 0.1% Tacrolimus ointment  . Complication of anesthesia     "because I have sleep apnea" (03/30/2013)  . Heart murmur   . Chest pain, exertional   . Chronic bronchitis (HCC)   . Sleep apnea     "waiting on my CPAP" (03/30/2013)  . Pneumonia     "a few times" (03/30/2013)  . Exertional shortness of breath     "& sometimes when laying down" (03/30/2013)  . Daily headache     "last 2 months" (03/30/2013)  . Arthritis     "joints" (03/30/2013)  . Varicose veins of legs   . Psoriasis    Past Surgical History  Procedure Laterality Date  . Spinal fusion      2004  . Carpal tunnel release  Bilateral   . Tubal ligation  1973  . Dilation and curettage of uterus  1970's    "once" (03/30/2013)  . 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 Joint replacement    . Vein surgery  left leg   Family History  Problem Relation Age of Onset  . Diabetes Sister   . Colon cancer Brother 40  . Colon cancer Brother    Social History  Substance Use Topics  . Smoking status: Never Smoker   . Smokeless tobacco: Never Used  . Alcohol Use: No   OB History    No data available     Review of Systems  Constitutional: Negative for fever and chills.  Respiratory: Negative for shortness of breath.   Cardiovascular: Negative for chest pain.  Gastrointestinal: Negative for nausea, vomiting, diarrhea and constipation.  Genitourinary: Negative for dysuria.  Neurological: Positive for dizziness and headaches.  All other systems reviewed and are negative.     Allergies  Review of patient's allergies indicates no known allergies.  Home Medications   Prior to Admission medications   Medication Sig Start Date End Date Taking? Authorizing Provider  acetaminophen (TYLENOL) 500 MG tablet Take 1 tablet (500 mg total) by mouth every 6 (six)  hours as needed. 08/28/15  Yes Cheri Fowler, PA-C  albuterol (PROVENTIL HFA;VENTOLIN HFA) 108 (90 BASE) MCG/ACT inhaler Inhale 2 puffs into the lungs every 6 (six) hours as needed for wheezing or shortness of breath (wheezing). 12/08/14  Yes Tyrone Nine, MD  amLODipine (NORVASC) 10 MG tablet Take 10 mg by mouth daily. 04/18/15  Yes Historical Provider, MD  atenolol (TENORMIN) 100 MG tablet TAKE 1 TABLET BY MOUTH EVERY DAY 02/20/15  Yes Tyrone Nine, MD  cyclobenzaprine (FLEXERIL) 10 MG tablet Take 1 tablet (10 mg total) by mouth 2 (two) times daily as needed for muscle spasms. 08/20/15  Yes Shari Upstill, PA-C  esomeprazole (NEXIUM) 40 MG capsule Take 1 capsule (40 mg total) by mouth daily. 06/18/15  Yes Mady Gemma, PA-C  FLUoxetine (PROZAC) 10 MG capsule Take  10 mg by mouth 3 (three) times daily.   Yes Historical Provider, MD  Fluticasone Propionate, Inhal, (FLOVENT DISKUS) 250 MCG/BLIST AEPB Inhale 2 puffs into the lungs every 4 (four) hours as needed (for shortness of breath and coughing).   Yes Historical Provider, MD  furosemide (LASIX) 20 MG tablet Take 1 tablet (20 mg total) by mouth daily. 10/29/14  Yes Tyrone Nine, MD  gabapentin (NEURONTIN) 300 MG capsule Take 1 capsule (300 mg total) by mouth 3 (three) times daily. 08/28/15  Yes Kayla Rose, PA-C  hydrOXYzine (ATARAX/VISTARIL) 10 MG tablet Take 1 tablet (10 mg total) by mouth every 6 (six) hours as needed for itching (itching). 05/11/15  Yes John Molpus, MD  meclizine (ANTIVERT) 25 MG tablet Take 1 tablet (25 mg total) by mouth 3 (three) times daily as needed for dizziness. 02/01/15  Yes Arby Barrette, MD  mometasone [facility]) 220 MCG/INH inhaler Inhale 2 puffs into the lungs daily.   Yes Historical Provider, MD  silver sulfADIAZINE (SILVADENE) 1 % cream APPLY TOPICALLY EVERY DAY 12/10/14  Yes Tyrone Nine, MD  triamcinolone cream (KENALOG) 0.1 % Apply 1 application topically 2 (two) times daily as needed (for legs). 10/29/14  Yes Tyrone Nine, MD  warfarin (COUMADIN) 5 MG tablet TAKE 1 TABLET BY MOUTH EVERY DAY Patient taking differently: TAKE 2 TABLETS  BY MOUTH EVERY DAY 10/18/14  Yes Levert Feinstein, MD  FLUoxetine (PROZAC) 20 MG capsule Take 3 capsules (60 mg total) by mouth daily. Patient taking differently: Take 20 mg by mouth daily.  05/26/14   Tyrone Nine, MD  lisinopril (PRINIVIL,ZESTRIL) 20 MG tablet Take 1 tablet (20 mg total) by mouth daily. Patient not taking: Reported on 08/28/2015 12/08/14   Tyrone Nine, MD   BP 135/87 mmHg  Pulse 70  Temp(Src) 98.5 F (36.9 C) (Oral)  Resp 19  Ht 5\' 5"  (1.651 m)  Wt 108.863 kg  BMI 39.94 kg/m2  SpO2 99%  LMP 11/22/1996 Physical Exam  Constitutional: She is oriented to person, place, and time. She appears well-developed and well-nourished.   HENT:  Head: Normocephalic and atraumatic.  Eyes: Conjunctivae and EOM are normal. Pupils are equal, round, and reactive to light.  Neck: Normal range of motion. Neck supple.  Cardiovascular: Normal rate and regular rhythm.  Exam reveals no gallop and no friction rub.   No murmur heard. Pulmonary/Chest: Effort normal and breath sounds normal. No respiratory distress. She has no wheezes. She has no rales. She exhibits no tenderness.  Abdominal: Soft. Bowel sounds are normal. She exhibits no distension and no mass. There is no tenderness. There is no rebound and no guarding.  Musculoskeletal: Normal range of motion. She exhibits no edema or tenderness.  Neurological: She is alert and oriented to person, place, and time.  CN3-12 intact, speech is clear, movements are goal oriented, ambulates without difficulty  Skin: Skin is warm and dry.  Psychiatric: She has a normal mood and affect. Her behavior is normal. Judgment and thought content normal.  Nursing note and vitals reviewed.   ED Course  Procedures (including critical care time) Labs Review Labs Reviewed  CBC - Abnormal; Notable for the following:    RBC 3.82 (*)    Hemoglobin 11.7 (*)    HCT 35.9 (*)    All other components within normal limits  BASIC METABOLIC PANEL - Abnormal; Notable for the following:    Glucose, Bld 120 (*)    All other components within normal limits  Rosezena SensorI-STAT TROPOININ, ED    Imaging Review Mr Brain Wo Contrast  09/07/2015  CLINICAL DATA:  Gradual onset of worsening anxiety over the last 2 weeks. Chronic headaches. EXAM: MRI HEAD WITHOUT CONTRAST TECHNIQUE: Multiplanar, multiecho pulse sequences of the brain and surrounding structures were obtained without intravenous contrast. COMPARISON:  CT 06/24/2015 an multiple previous over the last 8 years. FINDINGS: There is mild age related volume loss. There are minimal scattered foci of T2 and FLAIR signal within the pons in the cerebral hemispheric white matter  consistent with very minimal small vessel change, not advanced for age. No cortical or large vessel territory infarction. No mass lesion, hemorrhage, hydrocephalus or extra-axial collection. No pituitary mass. No inflammatory sinus disease. No skull or skullbase lesion. Major vessels at the base of the brain show flow. IMPRESSION: No acute, reversible or significant finding. Mild age related volume loss and minimal small vessel change, typical for age. Electronically Signed   By: Paulina FusiMark  Shogry M.D.   On: 09/07/2015 08:39   I have personally reviewed and evaluated these images and lab results as part of my medical decision-making.   EKG Interpretation   Date/Time:  Wednesday September 07 2015 07:10:59 EST Ventricular Rate:  57 PR Interval:  187 QRS Duration: 89 QT Interval:  414 QTC Calculation: 403 R Axis:   49 Text Interpretation:  Sinus rhythm Abnormal R-wave progression, early  transition Confirmed by Lincoln Brighamees, Liz [phone]([id number]) on 09/07/2015 7:14:27 AM      MDM   Final diagnoses:  Nonintractable headache, unspecified chronicity pattern, unspecified headache type  Dizziness  Arthritis  Gastroesophageal reflux disease, esophagitis presence not specified    Patient with complaints of headache and dizziness x 1 month.  No hx of stroke.  No MRI on review of records.  Will check MRI to rule out stroke.  Will check EKG, labs, and orthostatic VS.    Labs and imaging are negative for acute findings that would cause the patient's dizziness.  Hgb is baseline.  Orthostatic VS are normal.  MRI negative.   Patient seen by and discussed with Dr. Madilyn Hookees, who agrees with discharge plan.  Patient will need to follow-up with her PCP.  She asks for pain medicine for arthritis.  I declined at this time.  She will need to f/u with her PCP for this.  Do not feel it would be beneficial to a patient that has been feeling dizzy to add pain medicine on top.   Roxy Horsemanobert Yailene Badia, PA-C 09/07/15 1010  10:14 AM Patient  states at discharge that she would like a gun to shoot herself... She is understandably frustrated with her arthritis and other medical problems.  Discussed with Dr. Madilyn Hook.  We do not believe patient to be truly suicidal, but rather frustrated.  She is encouraged to follow-up with her doctor.  Roxy Horseman, PA-C 09/07/15 1017  Tilden Fossa, MD 09/08/15 [phone]

## 2015-09-07 NOTE — ED Notes (Signed)
Patient given discharge and follow up instructions, became angry with this RN that there were no other transportation options besides a taxi voucher or bus pass and that she was not receiving pain medication script. Spoke with Rob PA regarding request for pain medication script, he spoke with patient regarding this issue as well. PT given cab voucher, cab called for her.  She declines discharge vital signs or to sign for discharge.

## 2015-09-08 ENCOUNTER — Encounter (HOSPITAL_COMMUNITY): Payer: Self-pay | Admitting: Emergency Medicine

## 2015-09-08 ENCOUNTER — Emergency Department (HOSPITAL_COMMUNITY)
Admission: EM | Admit: 2015-09-08 | Discharge: 2015-09-09 | Disposition: A | Discharge status: Other Acute Inpatient Hospital | Payer: Medicare Other | Attending: Emergency Medicine | Admitting: Emergency Medicine

## 2015-09-08 ENCOUNTER — Emergency Department (HOSPITAL_COMMUNITY): Payer: Medicare Other

## 2015-09-08 DIAGNOSIS — M1990 Unspecified osteoarthritis, unspecified site: Secondary | ICD-10-CM | POA: Diagnosis not present

## 2015-09-08 DIAGNOSIS — Z86711 Personal history of pulmonary embolism: Secondary | ICD-10-CM | POA: Insufficient documentation

## 2015-09-08 DIAGNOSIS — Z7901 Long term (current) use of anticoagulants: Secondary | ICD-10-CM | POA: Insufficient documentation

## 2015-09-08 DIAGNOSIS — F209 Schizophrenia, unspecified: Secondary | ICD-10-CM | POA: Diagnosis not present

## 2015-09-08 DIAGNOSIS — R45850 Homicidal ideations: Secondary | ICD-10-CM | POA: Diagnosis not present

## 2015-09-08 DIAGNOSIS — Z8701 Personal history of pneumonia (recurrent): Secondary | ICD-10-CM | POA: Diagnosis not present

## 2015-09-08 DIAGNOSIS — F323 Major depressive disorder, single episode, severe with psychotic features: Secondary | ICD-10-CM | POA: Diagnosis present

## 2015-09-08 DIAGNOSIS — J45909 Unspecified asthma, uncomplicated: Secondary | ICD-10-CM | POA: Insufficient documentation

## 2015-09-08 DIAGNOSIS — F919 Conduct disorder, unspecified: Secondary | ICD-10-CM | POA: Diagnosis not present

## 2015-09-08 DIAGNOSIS — Z862 Personal history of diseases of the blood and blood-forming organs and certain disorders involving the immune mechanism: Secondary | ICD-10-CM | POA: Insufficient documentation

## 2015-09-08 DIAGNOSIS — R45851 Suicidal ideations: Secondary | ICD-10-CM | POA: Diagnosis not present

## 2015-09-08 DIAGNOSIS — Z008 Encounter for other general examination: Secondary | ICD-10-CM | POA: Diagnosis present

## 2015-09-08 DIAGNOSIS — Z872 Personal history of diseases of the skin and subcutaneous tissue: Secondary | ICD-10-CM | POA: Diagnosis not present

## 2015-09-08 DIAGNOSIS — F411 Generalized anxiety disorder: Secondary | ICD-10-CM | POA: Diagnosis not present

## 2015-09-08 DIAGNOSIS — F329 Major depressive disorder, single episode, unspecified: Secondary | ICD-10-CM | POA: Insufficient documentation

## 2015-09-08 DIAGNOSIS — Z7951 Long term (current) use of inhaled steroids: Secondary | ICD-10-CM | POA: Insufficient documentation

## 2015-09-08 DIAGNOSIS — Z79899 Other long term (current) drug therapy: Secondary | ICD-10-CM | POA: Insufficient documentation

## 2015-09-08 DIAGNOSIS — R011 Cardiac murmur, unspecified: Secondary | ICD-10-CM | POA: Insufficient documentation

## 2015-09-08 DIAGNOSIS — I10 Essential (primary) hypertension: Secondary | ICD-10-CM | POA: Insufficient documentation

## 2015-09-08 DIAGNOSIS — F419 Anxiety disorder, unspecified: Secondary | ICD-10-CM | POA: Diagnosis not present

## 2015-09-08 DIAGNOSIS — Z86718 Personal history of other venous thrombosis and embolism: Secondary | ICD-10-CM | POA: Insufficient documentation

## 2015-09-08 DIAGNOSIS — G8929 Other chronic pain: Secondary | ICD-10-CM | POA: Insufficient documentation

## 2015-09-08 DIAGNOSIS — K219 Gastro-esophageal reflux disease without esophagitis: Secondary | ICD-10-CM | POA: Insufficient documentation

## 2015-09-08 DIAGNOSIS — J988 Other specified respiratory disorders: Secondary | ICD-10-CM

## 2015-09-08 LAB — SALICYLATE LEVEL

## 2015-09-08 LAB — RAPID URINE DRUG SCREEN, HOSP PERFORMED
AMPHETAMINES: NOT DETECTED
BARBITURATES: NOT DETECTED
Benzodiazepines: NOT DETECTED
Cocaine: NOT DETECTED
OPIATES: NOT DETECTED
TETRAHYDROCANNABINOL: NOT DETECTED

## 2015-09-08 LAB — COMPREHENSIVE METABOLIC PANEL
ALBUMIN: 4.2 g/dL (ref 3.5–5.0)
ALK PHOS: 117 U/L (ref 38–126)
ALT: 13 U/L — AB (ref 14–54)
AST: 18 U/L (ref 15–41)
Anion gap: 11 (ref 5–15)
BUN: 10 mg/dL (ref 6–20)
CALCIUM: 9.5 mg/dL (ref 8.9–10.3)
CHLORIDE: 105 mmol/L (ref 101–111)
CO2: 25 mmol/L (ref 22–32)
CREATININE: 0.9 mg/dL (ref 0.44–1.00)
GFR calc non Af Amer: >60 mL/min (ref >60)
GLUCOSE: 98 mg/dL (ref 65–99)
Potassium: 3.8 mmol/L (ref 3.5–5.1)
SODIUM: 141 mmol/L (ref 135–145)
Total Bilirubin: 0.5 mg/dL (ref 0.3–1.2)
Total Protein: 8.5 g/dL — ABNORMAL HIGH (ref 6.5–8.1)

## 2015-09-08 LAB — CBC
HEMATOCRIT: 36.3 % (ref 36.0–46.0)
HEMOGLOBIN: 11.7 g/dL — AB (ref 12.0–15.0)
MCH: 30.2 pg (ref 26.0–34.0)
MCHC: 32.2 g/dL (ref 30.0–36.0)
MCV: 93.8 fL (ref 78.0–100.0)
Platelets: 310 10*3/uL (ref 150–400)
RBC: 3.87 MIL/uL (ref 3.87–5.11)
RDW: 14.6 % (ref 11.5–15.5)
WBC: 7.9 10*3/uL (ref 4.0–10.5)

## 2015-09-08 LAB — ACETAMINOPHEN LEVEL: Acetaminophen (Tylenol), Serum: 12 ug/mL (ref 10–30)

## 2015-09-08 LAB — PROTIME-INR
INR: 1.72 — AB (ref 0.00–1.49)
Prothrombin Time: 20.1 seconds — ABNORMAL HIGH (ref 11.6–15.2)

## 2015-09-08 LAB — ETHANOL: Alcohol, Ethyl (B): <5 mg/dL (ref <5)

## 2015-09-08 MED ORDER — GABAPENTIN 300 MG PO CAPS
300.0000 mg | ORAL_CAPSULE | Freq: Three times a day (TID) | ORAL | Status: DC
  Administered 2015-09-08 – 2015-09-09 (×4): 300 mg via ORAL
  Filled 2015-09-08 (×4): qty 1

## 2015-09-08 MED ORDER — ACETAMINOPHEN 500 MG PO TABS
500.0000 mg | ORAL_TABLET | Freq: Four times a day (QID) | ORAL | Status: DC | PRN
  Administered 2015-09-09: 500 mg via ORAL
  Filled 2015-09-08: qty 1

## 2015-09-08 MED ORDER — WARFARIN SODIUM 10 MG PO TABS
10.0000 mg | ORAL_TABLET | Freq: Once | ORAL | Status: AC
  Administered 2015-09-08: 10 mg via ORAL
  Filled 2015-09-08: qty 1

## 2015-09-08 MED ORDER — ATENOLOL 100 MG PO TABS
100.0000 mg | ORAL_TABLET | Freq: Every day | ORAL | Status: DC
  Administered 2015-09-08 – 2015-09-09 (×2): 100 mg via ORAL
  Filled 2015-09-08 (×2): qty 1

## 2015-09-08 MED ORDER — WARFARIN SODIUM 5 MG PO TABS: 5.0000 mg | ORAL_TABLET | Freq: Every day | ORAL | Status: DC

## 2015-09-08 MED ORDER — AMLODIPINE BESYLATE 10 MG PO TABS
10.0000 mg | ORAL_TABLET | Freq: Every day | ORAL | Status: DC
Start: 2015-09-08 — End: 2015-09-09
  Administered 2015-09-08 – 2015-09-09 (×2): 10 mg via ORAL
  Filled 2015-09-08 (×2): qty 1

## 2015-09-08 MED ORDER — FLUOXETINE HCL 10 MG PO CAPS
10.0000 mg | ORAL_CAPSULE | Freq: Three times a day (TID) | ORAL | Status: DC
Start: 2015-09-08 — End: 2015-09-09
  Administered 2015-09-08 (×2): 10 mg via ORAL
  Filled 2015-09-08 (×5): qty 1

## 2015-09-08 MED ORDER — HYDROXYZINE HCL 10 MG PO TABS
10.0000 mg | ORAL_TABLET | Freq: Four times a day (QID) | ORAL | Status: DC | PRN
  Administered 2015-09-09: 10 mg via ORAL
  Filled 2015-09-08 (×3): qty 1

## 2015-09-08 MED ORDER — LORAZEPAM 2 MG/ML IJ SOLN
2.0000 mg | Freq: Once | INTRAMUSCULAR | Status: AC
  Administered 2015-09-08: 2 mg via INTRAMUSCULAR
  Filled 2015-09-08: qty 1

## 2015-09-08 MED ORDER — WARFARIN - PHARMACIST DOSING INPATIENT: Freq: Every day | Status: DC

## 2015-09-08 NOTE — BH Assessment (Signed)
[facility] due to medical acuity per [place] Anaracy. Will continue to seek placement.

## 2015-09-08 NOTE — Progress Notes (Signed)
ANTICOAGULATION CONSULT NOTE - Initial Consult  Pharmacy Consult for warfarin Indication: pulmonary embolus  No Known Allergies  Patient Measurements:    Vital Signs: Temp: 97.8 F (36.6 C) (12/15 1412) Temp Source: Oral (12/15 1412) BP: 163/84 mmHg (12/15 1412) Pulse Rate: 63 (12/15 1412)  Labs:  Recent Labs  09/07/15 0643 09/08/15 1435 09/08/15 1518  HGB 11.7* 11.7*  --   HCT 35.9* 36.3  --   PLT 287 310  --   LABPROT  --   --  20.1*  INR  --   --  1.72*  CREATININE 0.91 0.90  --     Estimated Creatinine Clearance: 69.4 mL/min (by C-G formula based on Cr of 0.9).   Medical History: Past Medical History  Diagnosis Date  . Depression   . Schizophrenia (HCC)   . Hyperlipidemia   . Hypertension   . Chronic pain     "over my whole body" (03/30/2013)  . Pulmonary embolism (HCC) 12/2009    Large central bilateral PE's   . DVT (deep venous thrombosis) (HCC)     per 01/17/10 d/c summary- "remote hx of dvt"?  . Iron deficiency anemia   . Glaucoma   . Hemorrhoids   . Asthma   . Anxiety   . GERD (gastroesophageal reflux disease)   . Psoriasis 04/29/2012    New onset evaluated by Dr Marylou FlesherWilliam Huang, [facility]  Rx 0.1% Tacrolimus ointment  . Complication of anesthesia     "because I have sleep apnea" (03/30/2013)  . Heart murmur   . Chest pain, exertional   . Chronic bronchitis (HCC)   . Sleep apnea     "waiting on my CPAP" (03/30/2013)  . Pneumonia     "a few times" (03/30/2013)  . Exertional shortness of breath     "& sometimes when laying down" (03/30/2013)  . Daily headache     "last 2 months" (03/30/2013)  . Arthritis     "joints" (03/30/2013)  . Varicose veins of legs   . Psoriasis     Medications:  Scheduled:  . amLODipine  10 mg Oral Daily  . atenolol  100 mg Oral Daily  . FLUoxetine  10 mg Oral TID  . gabapentin  300 mg Oral TID   Infusions:    Assessment: 74 yo female presented to ER because of feeling overwhelmed and was due at court  today for some property damage. Patient now needs medical clearance after thinking about hurting others and burning their house down. Patient on warfarin PTA at a reported dose of 10mg  daily for hx PE. Baseline INR subtherapeutic at 1.72  Goal of Therapy:  INR 2-3   Plan:  1) Warfarin 10mg  tonight 2) Daily INR   Hessie KnowsJustin M Rayma Hegg, PharmD, BCPS Pager [phone] 09/08/2015 4:51 PM

## 2015-09-08 NOTE — Progress Notes (Signed)
Cp is richard pavelock [facility]  EPIC updated

## 2015-09-08 NOTE — ED Notes (Addendum)
On patient's arrival to ED patient states she is overwhelmed. States she's thinking about hurting others by burning their house down, and that she'd like to hurt herself but doesn't know how. [facility] wouldn't take patient d/t BP being too high. 163/84 on left arm in triage. Patient has no acute medical complaints.   IVC paperwork: "The respondent has been diagnosed with schizophrenia. Currently she is not medication compliant and she has been having suicidal as well has homicidal thoughts. For example recently she has voiced her intention to kill herself as well as her daughter. As a result, she is a danger to herself and others."

## 2015-09-08 NOTE — ED Notes (Signed)
On admission to Psych ED pt appears irritable and angry. She is easy to talk to and cooperative. Her speech is tangential, but it appears that she is here because of family conflict and anger. Per pt, [facility] "[facility] and heart attack. My family expects me to take care of him, but I cannot."  She said that she hates her 3 sisters-in law, and her daughter. She loves her other children, especially her son who suffers from seizures. She said that she will burn those people up in the house that they live in.  Her affect is angry when she says this.

## 2015-09-08 NOTE — ED Notes (Signed)
Pt from [facility].  Pt presented there because she was overwhelmed.  Pt was at court today for some property damage.  [facility] did not want to accept the patient without a medical clearance because she is hypertensive and have a lot of medications that she cannot remember when she took them.  They will possibly take her back depending on her status of medical clearance from here.

## 2015-09-08 NOTE — ED Notes (Signed)
Patient is alert and watching television in room. Patient states she is here because " I got in an argument with my daughter and she and my other children is blaming me for my son being sick and my husband being sick and I am tired of them blaming me. I have to stay away from my daughter because it makes me want to do things to hurt her." Patient expressed feelings and support was offered. Patient night medications reviewed with her per her request and patient questions answered. Q 15 minute checks in progress and maintained and monitoring continues.

## 2015-09-08 NOTE — Clinical Social Work Note (Signed)
CSW provided pt's RN with disposition of inpatient treatment.  RN stated that she would provide in report as next shift is coming on.  [name] Bubaegina Zymiere Trostle LCSW
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
## 2015-09-08 NOTE — Progress Notes (Addendum)
Patient meets inpatient criteria and has been referred to: [facility] ([facility]) - per Williemae Natterandice, 1 female geriatric bed, fax referral. [facility] - per intake, fax referral for the waitlist. [facility] - left voicemail. [name] - per Christa, fax it, beds open. [facility] - left voicemail [place] - per Minerva EndsLoren, can fax referral. EKG and Chest Xray results have been added to referral and faxed.  [facility]: [facility] - per French Anaracy, due medical  [facility]: [facility]  [facility]  [facility], [place] Disposition staff 09/08/2015 8:27 PM
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
## 2015-09-09 DIAGNOSIS — R45850 Homicidal ideations: Secondary | ICD-10-CM | POA: Diagnosis not present

## 2015-09-09 DIAGNOSIS — F323 Major depressive disorder, single episode, severe with psychotic features: Secondary | ICD-10-CM

## 2015-09-09 DIAGNOSIS — R45851 Suicidal ideations: Secondary | ICD-10-CM | POA: Diagnosis not present

## 2015-09-09 DIAGNOSIS — F411 Generalized anxiety disorder: Secondary | ICD-10-CM | POA: Diagnosis not present

## 2015-09-09 LAB — PROTIME-INR
INR: 1.89 — AB (ref 0.00–1.49)
Prothrombin Time: 21.6 seconds — ABNORMAL HIGH (ref 11.6–15.2)

## 2015-09-09 MED ORDER — BUSPIRONE HCL 5 MG PO TABS
7.5000 mg | ORAL_TABLET | Freq: Two times a day (BID) | ORAL | Status: DC
  Administered 2015-09-09: 7.5 mg via ORAL
  Filled 2015-09-09 (×2): qty 1
  Filled 2015-09-09: qty 1.5

## 2015-09-09 MED ORDER — TRAMADOL HCL 50 MG PO TABS
50.0000 mg | ORAL_TABLET | Freq: Two times a day (BID) | ORAL | Status: DC | PRN
  Administered 2015-09-09: 50 mg via ORAL
  Filled 2015-09-09: qty 1

## 2015-09-09 MED ORDER — ESOMEPRAZOLE MAGNESIUM 40 MG PO CPDR: 40.0000 mg | DELAYED_RELEASE_CAPSULE | Freq: Every day | ORAL | Status: DC

## 2015-09-09 MED ORDER — WARFARIN SODIUM 10 MG PO TABS
10.0000 mg | ORAL_TABLET | Freq: Once | ORAL | Status: AC
  Administered 2015-09-09: 10 mg via ORAL
  Filled 2015-09-09: qty 1

## 2015-09-09 MED ORDER — FLUOXETINE HCL 20 MG PO CAPS
20.0000 mg | ORAL_CAPSULE | Freq: Every day | ORAL | Status: DC
  Administered 2015-09-09: 20 mg via ORAL
  Filled 2015-09-09: qty 1

## 2015-09-09 MED ORDER — FUROSEMIDE 20 MG PO TABS
20.0000 mg | ORAL_TABLET | Freq: Every day | ORAL | Status: DC
  Administered 2015-09-09: 20 mg via ORAL
  Filled 2015-09-09: qty 1

## 2015-09-09 MED ORDER — ALBUTEROL SULFATE HFA 108 (90 BASE) MCG/ACT IN AERS: 2.0000 | INHALATION_SPRAY | Freq: Four times a day (QID) | RESPIRATORY_TRACT | Status: DC | PRN

## 2015-09-09 MED ORDER — PANTOPRAZOLE SODIUM 40 MG PO TBEC
40.0000 mg | DELAYED_RELEASE_TABLET | Freq: Every day | ORAL | Status: DC
  Administered 2015-09-09: 40 mg via ORAL
  Filled 2015-09-09: qty 1

## 2015-09-09 NOTE — Progress Notes (Signed)
D Pt is discharging within 30 minutes to another facility.  Writer assessed pt.'s VS and pain.  Pt. Was aware [place] would be [place] the [place].  Pt. Was clam and cooperative.    A Writer offered support and encouragement.  R Pt. [facility] safely [facility].

## 2015-09-09 NOTE — ED Notes (Signed)
[facility] called for transport request. 
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
## 2015-09-09 NOTE — BH Assessment (Signed)
[facility] Assessment Progress Note  Per Thedore MinsMojeed Akintayo, MD, this pt requires psychiatric hospitalization at this time.  At 15:30 [facility] reports that Lowanda FosterBeverly Jones, MD, has accepted pt to their facility, Rm 404A.  Please call report to [phone].  Jovea, TS agrees to notify the EDP.  Pt's nurse, Kendal Hymendie, has also been notified. She agrees to call report.  [facility] under IVC [facility]  Doylene Canninghomas Adaiah Jaskot, MA Triage Specialist [phone]

## 2015-09-09 NOTE — ED Provider Notes (Signed)
15:45-[name]. Patient is comfortable. She has no complaints at this time. She understands that [name] care and treatment of her psychiatric condition.  Mancel BaleElliott Knox Cervi, MD 09/09/15 [phone]

## 2015-09-09 NOTE — Progress Notes (Signed)
ANTICOAGULATION CONSULT NOTE  Pharmacy Consult for warfarin Indication: pulmonary embolus  No Known Allergies  Patient Measurements:    Vital Signs: Temp: 97.7 F (36.5 C) (12/16 0627) Temp Source: Oral (12/16 0627) BP: 126/74 mmHg (12/16 0627) Pulse Rate: 96 (12/16 0627)  Labs:  Recent Labs  09/07/15 0643 09/08/15 1435 09/08/15 1518 09/09/15 0750  HGB 11.7* 11.7*  --   --   HCT 35.9* 36.3  --   --   PLT 287 310  --   --   LABPROT  --   --  20.1* 21.6*  INR  --   --  1.72* 1.89*  CREATININE 0.91 0.90  --   --     Estimated Creatinine Clearance: 69.4 mL/min (by C-G formula based on Cr of 0.9).   Medical History: Past Medical History  Diagnosis Date  . Depression   . Schizophrenia (H[facility])   . Hyperlipidemia   . Hypertension   . Chronic pain     "over my whole body" (03/30/2013)  . Pulmonary embolism (H[facility]) 12/2009    Large central bilateral PE's   . DVT (deep venous thrombosis) ([facility])     per 01/17/10 d/c summary- "remote hx of dvt"?  . Iron deficiency anemia   . Glaucoma   . Hemorrhoids   . Asthma   . Anxiety   . GERD (gastroesophageal reflux disease)   . Psoriasis 04/29/2012    New onset evaluated by Dr Marylou FlesherWilliam Huang, [facility]  Rx 0.1% Tacrolimus ointment  . Complication of anesthesia     "because I have sleep apnea" (03/30/2013)  . Heart murmur   . Chest pain, exertional   . Chronic bronchitis (HCC)   . Sleep apnea     "waiting on my CPAP" (03/30/2013)  . Pneumonia     "a few times" (03/30/2013)  . Exertional shortness of breath     "& sometimes when laying down" (03/30/2013)  . Daily headache     "last 2 months" (03/30/2013)  . Arthritis     "joints" (03/30/2013)  . Varicose veins of legs   . Psoriasis     Medications:  Reported PTA warfarin dose: 10mg  Daily  Assessment: 74 yo female on chronic Coumadin for hx PE.  Baseline INR subtherapeutic on admission (1.72).   She is currently awaiting inpt psych bed.   09/09/2015:  INR rising  to goal (1.89) No bleeding reported. Regular diet ordered No DDI noted  Goal of Therapy:  INR 2-3   Plan:  1) Warfarin 10mg  tonight 2) Daily INR  Junita PushMichelle Naidelyn Parrella, PharmD, BCPS Pager: [phone] 09/09/2015 8:58 AM

## 2015-09-20 ENCOUNTER — Encounter (HOSPITAL_COMMUNITY): Payer: Self-pay | Admitting: Emergency Medicine

## 2015-09-20 ENCOUNTER — Emergency Department (HOSPITAL_COMMUNITY)
Admission: EM | Admit: 2015-09-20 | Discharge: 2015-09-26 | Disposition: A | Discharge status: Home / Self Care | Payer: Medicare Other | Attending: Emergency Medicine | Admitting: Emergency Medicine

## 2015-09-20 DIAGNOSIS — F209 Schizophrenia, unspecified: Secondary | ICD-10-CM | POA: Diagnosis not present

## 2015-09-20 DIAGNOSIS — Y9389 Activity, other specified: Secondary | ICD-10-CM | POA: Diagnosis not present

## 2015-09-20 DIAGNOSIS — J45909 Unspecified asthma, uncomplicated: Secondary | ICD-10-CM | POA: Insufficient documentation

## 2015-09-20 DIAGNOSIS — Y998 Other external cause status: Secondary | ICD-10-CM | POA: Insufficient documentation

## 2015-09-20 DIAGNOSIS — S8991XA Unspecified injury of right lower leg, initial encounter: Secondary | ICD-10-CM | POA: Insufficient documentation

## 2015-09-20 DIAGNOSIS — Z862 Personal history of diseases of the blood and blood-forming organs and certain disorders involving the immune mechanism: Secondary | ICD-10-CM | POA: Insufficient documentation

## 2015-09-20 DIAGNOSIS — G8929 Other chronic pain: Secondary | ICD-10-CM | POA: Insufficient documentation

## 2015-09-20 DIAGNOSIS — S4992XA Unspecified injury of left shoulder and upper arm, initial encounter: Secondary | ICD-10-CM | POA: Diagnosis not present

## 2015-09-20 DIAGNOSIS — F332 Major depressive disorder, recurrent severe without psychotic features: Secondary | ICD-10-CM | POA: Diagnosis present

## 2015-09-20 DIAGNOSIS — Z01818 Encounter for other preprocedural examination: Secondary | ICD-10-CM

## 2015-09-20 DIAGNOSIS — F330 Major depressive disorder, recurrent, mild: Secondary | ICD-10-CM | POA: Diagnosis present

## 2015-09-20 DIAGNOSIS — Y9289 Other specified places as the place of occurrence of the external cause: Secondary | ICD-10-CM | POA: Insufficient documentation

## 2015-09-20 DIAGNOSIS — I10 Essential (primary) hypertension: Secondary | ICD-10-CM | POA: Diagnosis not present

## 2015-09-20 DIAGNOSIS — Z7951 Long term (current) use of inhaled steroids: Secondary | ICD-10-CM | POA: Diagnosis not present

## 2015-09-20 DIAGNOSIS — Z86711 Personal history of pulmonary embolism: Secondary | ICD-10-CM | POA: Insufficient documentation

## 2015-09-20 DIAGNOSIS — Z872 Personal history of diseases of the skin and subcutaneous tissue: Secondary | ICD-10-CM | POA: Insufficient documentation

## 2015-09-20 DIAGNOSIS — W1839XA Other fall on same level, initial encounter: Secondary | ICD-10-CM | POA: Insufficient documentation

## 2015-09-20 DIAGNOSIS — Z7901 Long term (current) use of anticoagulants: Secondary | ICD-10-CM | POA: Insufficient documentation

## 2015-09-20 DIAGNOSIS — F419 Anxiety disorder, unspecified: Secondary | ICD-10-CM | POA: Diagnosis not present

## 2015-09-20 DIAGNOSIS — K219 Gastro-esophageal reflux disease without esophagitis: Secondary | ICD-10-CM | POA: Diagnosis not present

## 2015-09-20 DIAGNOSIS — Z8701 Personal history of pneumonia (recurrent): Secondary | ICD-10-CM | POA: Insufficient documentation

## 2015-09-20 DIAGNOSIS — S8992XA Unspecified injury of left lower leg, initial encounter: Secondary | ICD-10-CM | POA: Insufficient documentation

## 2015-09-20 DIAGNOSIS — E785 Hyperlipidemia, unspecified: Secondary | ICD-10-CM | POA: Insufficient documentation

## 2015-09-20 DIAGNOSIS — Z79899 Other long term (current) drug therapy: Secondary | ICD-10-CM | POA: Diagnosis not present

## 2015-09-20 DIAGNOSIS — Z86718 Personal history of other venous thrombosis and embolism: Secondary | ICD-10-CM | POA: Insufficient documentation

## 2015-09-20 DIAGNOSIS — S4991XA Unspecified injury of right shoulder and upper arm, initial encounter: Secondary | ICD-10-CM | POA: Diagnosis not present

## 2015-09-20 DIAGNOSIS — R45851 Suicidal ideations: Secondary | ICD-10-CM | POA: Insufficient documentation

## 2015-09-20 DIAGNOSIS — Z008 Encounter for other general examination: Secondary | ICD-10-CM | POA: Diagnosis present

## 2015-09-20 DIAGNOSIS — M1990 Unspecified osteoarthritis, unspecified site: Secondary | ICD-10-CM | POA: Insufficient documentation

## 2015-09-20 DIAGNOSIS — R011 Cardiac murmur, unspecified: Secondary | ICD-10-CM | POA: Diagnosis not present

## 2015-09-20 LAB — URINALYSIS, ROUTINE W REFLEX MICROSCOPIC
BILIRUBIN URINE: NEGATIVE
Glucose, UA: NEGATIVE mg/dL
HGB URINE DIPSTICK: NEGATIVE
KETONES UR: NEGATIVE mg/dL
NITRITE: NEGATIVE
Protein, ur: NEGATIVE mg/dL
SPECIFIC GRAVITY, URINE: 1.02 (ref 1.005–1.030)
pH: 7 (ref 5.0–8.0)

## 2015-09-20 LAB — RAPID URINE DRUG SCREEN, HOSP PERFORMED
AMPHETAMINES: NOT DETECTED
BENZODIAZEPINES: NOT DETECTED
Barbiturates: NOT DETECTED
COCAINE: NOT DETECTED
Opiates: NOT DETECTED
Tetrahydrocannabinol: NOT DETECTED

## 2015-09-20 LAB — ACETAMINOPHEN LEVEL

## 2015-09-20 LAB — CBC
HEMATOCRIT: 35.1 % — AB (ref 36.0–46.0)
Hemoglobin: 11.5 g/dL — ABNORMAL LOW (ref 12.0–15.0)
MCH: 30.3 pg (ref 26.0–34.0)
MCHC: 32.8 g/dL (ref 30.0–36.0)
MCV: 92.6 fL (ref 78.0–100.0)
PLATELETS: 300 10*3/uL (ref 150–400)
RBC: 3.79 MIL/uL — AB (ref 3.87–5.11)
RDW: 14.2 % (ref 11.5–15.5)
WBC: 7.3 10*3/uL (ref 4.0–10.5)

## 2015-09-20 LAB — URINE MICROSCOPIC-ADD ON

## 2015-09-20 LAB — COMPREHENSIVE METABOLIC PANEL
ALT: 16 U/L (ref 14–54)
ANION GAP: 11 (ref 5–15)
AST: 25 U/L (ref 15–41)
Albumin: 3.7 g/dL (ref 3.5–5.0)
Alkaline Phosphatase: 105 U/L (ref 38–126)
BILIRUBIN TOTAL: 0.4 mg/dL (ref 0.3–1.2)
BUN: 8 mg/dL (ref 6–20)
CHLORIDE: 101 mmol/L (ref 101–111)
CO2: 28 mmol/L (ref 22–32)
Calcium: 9.4 mg/dL (ref 8.9–10.3)
Creatinine, Ser: 0.82 mg/dL (ref 0.44–1.00)
GFR calc Af Amer: >60 mL/min (ref >60)
GLUCOSE: 133 mg/dL — AB (ref 65–99)
POTASSIUM: 3.4 mmol/L — AB (ref 3.5–5.1)
Sodium: 140 mmol/L (ref 135–145)
TOTAL PROTEIN: 8.1 g/dL (ref 6.5–8.1)

## 2015-09-20 LAB — PROTIME-INR
INR: 1.64 — ABNORMAL HIGH (ref 0.00–1.49)
Prothrombin Time: 19.4 seconds — ABNORMAL HIGH (ref 11.6–15.2)

## 2015-09-20 LAB — ETHANOL: Alcohol, Ethyl (B): <5 mg/dL (ref <5)

## 2015-09-20 LAB — APTT: aPTT: 35 seconds (ref 24–37)

## 2015-09-20 LAB — CK: Total CK: 276 U/L — ABNORMAL HIGH (ref 38–234)

## 2015-09-20 LAB — SALICYLATE LEVEL: Salicylate Lvl: <4 mg/dL (ref 2.8–30.0)

## 2015-09-20 MED ORDER — ZOLPIDEM TARTRATE 5 MG PO TABS
5.0000 mg | ORAL_TABLET | Freq: Every evening | ORAL | Status: DC | PRN
  Administered 2015-09-20: 5 mg via ORAL
  Filled 2015-09-20: qty 1

## 2015-09-20 MED ORDER — LORAZEPAM 1 MG PO TABS
1.0000 mg | ORAL_TABLET | Freq: Three times a day (TID) | ORAL | Status: DC | PRN
  Administered 2015-09-20 – 2015-09-25 (×6): 1 mg via ORAL
  Filled 2015-09-20 (×7): qty 1

## 2015-09-20 MED ORDER — ACETAMINOPHEN 325 MG PO TABS
650.0000 mg | ORAL_TABLET | ORAL | Status: DC | PRN
  Administered 2015-09-20 – 2015-09-26 (×5): 650 mg via ORAL
  Filled 2015-09-20 (×5): qty 2

## 2015-09-20 MED ORDER — IBUPROFEN 200 MG PO TABS
600.0000 mg | ORAL_TABLET | Freq: Three times a day (TID) | ORAL | Status: DC | PRN
  Administered 2015-09-20 – 2015-09-21 (×2): 600 mg via ORAL
  Filled 2015-09-20 (×2): qty 3

## 2015-09-20 MED ORDER — ALUM & MAG HYDROXIDE-SIMETH 200-200-20 MG/5ML PO SUSP
30.0000 mL | ORAL | Status: DC | PRN
Start: 2015-09-20 — End: 2015-09-26
  Administered 2015-09-24 – 2015-09-26 (×3): 30 mL via ORAL
  Filled 2015-09-20 (×4): qty 30

## 2015-09-20 MED ORDER — NICOTINE 21 MG/24HR TD PT24
21.0000 mg | MEDICATED_PATCH | Freq: Every day | TRANSDERMAL | Status: DC
  Filled 2015-09-20: qty 1

## 2015-09-20 MED ORDER — ONDANSETRON HCL 4 MG PO TABS
4.0000 mg | ORAL_TABLET | Freq: Three times a day (TID) | ORAL | Status: DC | PRN
Start: 2015-09-20 — End: 2015-09-26
  Administered 2015-09-20: 4 mg via ORAL
  Filled 2015-09-20: qty 1
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
## 2015-09-20 NOTE — ED Notes (Signed)
Patient presents from home via EMS for chronic arthritic pain, c/o pain in shoulders and knees. Fell yesterday and was unable to get up, no LOC, no anticoagulants. Hasn't taken home prescriptions since last week. Ambulatory with cane and assistance.   Last VS: 170/90, 78hr, 18resp, 84cbg

## 2015-09-20 NOTE — ED Provider Notes (Signed)
History  By signing my name below, I, Karle Plumber, attest that this documentation has been prepared under the direction and in the presence of Ivery Quale, PA-C. Electronically Signed: Karle Plumber, ED Scribe. 09/20/2015. 7:20 PM.  Chief Complaint  Patient presents with  . Joint Pain   The history is provided by the patient and medical records. No language interpreter was used.    HPI Comments:  [name] [name] is a 74 y.o. obese female with PMHx of arthritis brought in by EMS, who presents to the Emergency Department complaining of a fall that occurred two days ago. She states she was unable to get herself up and laid there for several hours through the night. She now reports aching pains in bilateral knees and bilateral shoulders. She has not taken anything for pain. She does not report any modifying factors. She states she was hospitalized about 11 days ago for psychiatric issues. She also reports SI if she does not get into a nursing facility. She states she cannot go back home because she lives alone. She states she has not taken any of her medications because she is afraid that the pharmacy made an error. She has PMHx of schizophrenia, depression and anxiety.  Past Medical History  Diagnosis Date  . Depression   . Schizophrenia (HCC)   . Hyperlipidemia   . Hypertension   . Chronic pain     "over my whole body" (03/30/2013)  . Pulmonary embolism (HCC) 12/2009    Large central bilateral PE's   . DVT (deep venous thrombosis) (HCC)     per 01/17/10 d/c summary- "remote hx of dvt"?  . Iron deficiency anemia   . Glaucoma   . Hemorrhoids   . Asthma   . Anxiety   . GERD (gastroesophageal reflux disease)   . Psoriasis 04/29/2012    New onset evaluated by Dr Marylou Flesher, Dermatology, [facility] 6/13  Rx 0.1% Tacrolimus ointment  . Complication of anesthesia     "because I have sleep apnea" (03/30/2013)  . Heart murmur   . Chest pain, exertional   . Chronic bronchitis (HCC)    . Sleep apnea     "waiting on my CPAP" (03/30/2013)  . Pneumonia     "a few times" (03/30/2013)  . Exertional shortness of breath     "& sometimes when laying down" (03/30/2013)  . Daily headache     "last 2 months" (03/30/2013)  . Arthritis     "joints" (03/30/2013)  . Varicose veins of legs   . Psoriasis    Past Surgical History  Procedure Laterality Date  . Spinal fusion      2004  . Carpal tunnel release Bilateral   . Tubal ligation  1973  . Dilation and curettage of uterus  1970's    "once" (03/30/2013)  . 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 Joint replacement    . Vein surgery  left leg   Family History  Problem Relation Age of Onset  . Diabetes Sister   . Colon cancer Brother 40  . Colon cancer Brother    Social History  Substance Use Topics  . Smoking status: Never Smoker   . Smokeless tobacco: Never Used  . Alcohol Use: No   OB History    No data available     Review of Systems  Musculoskeletal: Positive for arthralgias.  All other systems reviewed and are negative.   Allergies  Review of patient's allergies indicates no known allergies.  Home  Medications   Prior to Admission medications   Medication Sig Start Date End Date Taking? Authorizing Provider  acetaminophen (TYLENOL) 500 MG tablet Take 1 tablet (500 mg total) by mouth every 6 (six) hours as needed. 08/28/15   Cheri Fowler, PA-C  albuterol (PROVENTIL HFA;VENTOLIN HFA) 108 (90 BASE) MCG/ACT inhaler Inhale 2 puffs into the lungs every 6 (six) hours as needed for wheezing or shortness of breath (wheezing). 12/08/14   Tyrone Nine, MD  amLODipine (NORVASC) 10 MG tablet Take 10 mg by mouth daily. 04/18/15   Historical Provider, MD  atenolol (TENORMIN) 100 MG tablet TAKE 1 TABLET BY MOUTH EVERY DAY Patient taking differently: TAKE 100 MG BY MOUTH DAILY 02/20/15   Tyrone Nine, MD  cyclobenzaprine (FLEXERIL) 10 MG tablet Take 1 tablet (10 mg total) by mouth 2 (two) times daily as needed for muscle spasms.  08/20/15   Elpidio Anis, PA-C  esomeprazole (NEXIUM) 40 MG capsule Take 1 capsule (40 mg total) by mouth daily. 09/07/15   Roxy Horseman, PA-C  FLUoxetine (PROZAC) 10 MG capsule Take 10 mg by mouth 3 (three) times daily.    Historical Provider, MD  FLUoxetine (PROZAC) 20 MG capsule Take 3 capsules (60 mg total) by mouth daily. Patient not taking: Reported on 09/08/2015 05/26/14   Tyrone Nine, MD  Fluticasone Propionate, Inhal, (FLOVENT DISKUS) 250 MCG/BLIST AEPB Inhale 2 puffs into the lungs every 4 (four) hours as needed (for shortness of breath and coughing).    Historical Provider, MD  furosemide (LASIX) 20 MG tablet Take 1 tablet (20 mg total) by mouth daily. 10/29/14   Tyrone Nine, MD  gabapentin (NEURONTIN) 300 MG capsule Take 1 capsule (300 mg total) by mouth 3 (three) times daily. 08/28/15   Cheri Fowler, PA-C  hydrOXYzine (ATARAX/VISTARIL) 10 MG tablet Take 1 tablet (10 mg total) by mouth every 6 (six) hours as needed for itching (itching). 05/11/15   John Molpus, MD  lisinopril (PRINIVIL,ZESTRIL) 20 MG tablet Take 1 tablet (20 mg total) by mouth daily. Patient not taking: Reported on 08/28/2015 12/08/14   Tyrone Nine, MD  meclizine (ANTIVERT) 25 MG tablet Take 1 tablet (25 mg total) by mouth 3 (three) times daily as needed for dizziness. 02/01/15   Arby Barrette, MD  mometasone [facility]) 220 MCG/INH inhaler Inhale 2 puffs into the lungs daily.    Historical Provider, MD  silver sulfADIAZINE (SILVADENE) 1 % cream APPLY TOPICALLY EVERY DAY 12/10/14   Tyrone Nine, MD  triamcinolone cream (KENALOG) 0.1 % Apply 1 application topically 2 (two) times daily as needed (for legs). 10/29/14   Tyrone Nine, MD  warfarin (COUMADIN) 5 MG tablet TAKE 1 TABLET BY MOUTH EVERY DAY Patient taking differently: TAKE 2 TABLETS  BY MOUTH EVERY DAY 10/18/14   Levert Feinstein, MD   Triage Vitals: BP 172/91 mmHg  Pulse 80  Temp(Src) 98.1 F (36.7 C) (Oral)  Resp 20  SpO2 93%  LMP 11/22/1996 Physical Exam   Constitutional: She appears well-developed and well-nourished.  HENT:  Head: Normocephalic and atraumatic.  Nose: Nose normal.  Mouth/Throat: Oropharynx is clear and moist.  Multiple teeth missing.  Eyes: EOM are normal.  Neck: Normal range of motion.  Cardiovascular: Normal rate, regular rhythm and normal heart sounds.   Pulmonary/Chest: Effort normal and breath sounds normal. No respiratory distress.  Abdominal: Soft. Bowel sounds are normal. There is no tenderness.  Musculoskeletal: Normal range of motion.  Multiple varicose veins. Multiple joint pains.  Skin: Skin is warm and dry.  Psychiatric: Her mood appears anxious. She exhibits a depressed mood. She expresses suicidal ideation.  Disoriented to time. Expresses suicidal ideations.  Nursing note and vitals reviewed.   ED Course  Procedures (including critical care time) DIAGNOSTIC STUDIES: Oxygen Saturation is 93% on RA, low by my interpretation.   COORDINATION OF CARE: 7:12 PM- Pt verbalizes understanding and agrees to plan.  Medications - No data to display  Labs Review Labs Reviewed - No data to display  Imaging Review No results found. I have personally reviewed and evaluated these images and lab results as part of my medical decision-making.   EKG Interpretation None      MDM Vital signs reviewed. Patient states that she sustained a fall on yesterday, and slept in the floor most of the night. She was brought to the emergency department by EMS. She complains of multiple joint pains, but also complains of suicidal ideation. She seems confused, and she states that her doctor is Dr. Babs BertinMartin Luther King. She states that she was with her family for Thanksgiving, instead of Christmas. She states that she cannot go back to her apartment because she self admits "just too much". The patient is moved from a fast track posture to psychiatric evaluation. We'll also evaluate for possible rhabdomyolysis. TTS eval pending  screening labs. Pt[facility]    Final diagnoses:  None    **I have reviewed nursing notes, vital signs, and all appropriate lab and imaging results for this patient.* I personally performed the services described in this documentation, which was scribed in my presence. The recorded information has been reviewed and is accurate.    Ivery QualeHobson Mayuri Staples, PA-C 09/21/15 1108  Courteney Randall AnLyn Mackuen, MD 09/22/15 [phone]

## 2015-09-20 NOTE — Progress Notes (Signed)
Patient reports she does not want to see her daughter Veda CanningMIRIAM Hentges.

## 2015-09-20 NOTE — ED Provider Notes (Signed)
Patient received in sign out from PA [place] at shift change.  Briefly, 74 y.o. F initially here with multiple areas of joint pain. She has a history of the same. She apparently fell yesterday. She has not been taking her medications. Patient is currently ambulatory with cane with assistance. While here in the emergency department, patient began reporting suicidal ideation and she has to return home as she does not feel she is able to care for herself independently any longer. Patient does have psychiatric history and was recently at [facility] undergoing treatment for this.  Plan:  Labs pending.  Will get TTS consult once medically cleared.  Results for orders placed or performed during the hospital encounter of 09/20/15  Comprehensive metabolic panel  Result Value Ref Range   Sodium 140 135 - 145 mmol/L   Potassium 3.4 (L) 3.5 - 5.1 mmol/L   Chloride 101 101 - 111 mmol/L   CO2 28 22 - 32 mmol/L   Glucose, Bld 133 (H) 65 - 99 mg/dL   BUN 8 6 - 20 mg/dL   Creatinine, Ser 7.82 0.44 - 1.00 mg/dL   Calcium 9.4 8.9 - 95.6 mg/dL   Total Protein 8.1 6.5 - 8.1 g/dL   Albumin 3.7 3.5 - 5.0 g/dL   AST 25 15 - 41 U/L   ALT 16 14 - 54 U/L   Alkaline Phosphatase 105 38 - 126 U/L   Total Bilirubin 0.4 0.3 - 1.2 mg/dL   GFR calc non Af Amer >60 >60 mL/min   GFR calc Af Amer >60 >60 mL/min   Anion gap 11 5 - 15  Ethanol (ETOH)  Result Value Ref Range   Alcohol, Ethyl (B) <5 <5 mg/dL  Salicylate level  Result Value Ref Range   Salicylate Lvl <4.0 2.8 - 30.0 mg/dL  Acetaminophen level  Result Value Ref Range   Acetaminophen (Tylenol), Serum <10 (L) 10 - 30 ug/mL  CBC  Result Value Ref Range   WBC 7.3 4.0 - 10.5 K/uL   RBC 3.79 (L) 3.87 - 5.11 MIL/uL   Hemoglobin 11.5 (L) 12.0 - 15.0 g/dL   HCT 21.3 (L) 08.6 - 57.8 %   MCV 92.6 78.0 - 100.0 fL   MCH 30.3 26.0 - 34.0 pg   MCHC 32.8 30.0 - 36.0 g/dL   RDW 46.9 62.9 - 52.8 %   Platelets 300 150 - 400 K/uL  Urine rapid drug screen (hosp  performed) (Not at [facility])  Result Value Ref Range   Opiates NONE DETECTED NONE DETECTED   Cocaine NONE DETECTED NONE DETECTED   Benzodiazepines NONE DETECTED NONE DETECTED   Amphetamines NONE DETECTED NONE DETECTED   Tetrahydrocannabinol NONE DETECTED NONE DETECTED   Barbiturates NONE DETECTED NONE DETECTED  CK  Result Value Ref Range   Total CK 276 (H) 38 - 234 U/L  APTT  Result Value Ref Range   aPTT 35 24 - 37 seconds  Protime-INR  Result Value Ref Range   Prothrombin Time 19.4 (H) 11.6 - 15.2 seconds   INR 1.64 (H) 0.00 - 1.49  Urinalysis, Routine w reflex microscopic (not at [facility])  Result Value Ref Range   Color, Urine YELLOW YELLOW   APPearance CLOUDY (A) CLEAR   Specific Gravity, Urine 1.020 1.005 - 1.030   pH 7.0 5.0 - 8.0   Glucose, UA NEGATIVE NEGATIVE mg/dL   Hgb urine dipstick NEGATIVE NEGATIVE   Bilirubin Urine NEGATIVE NEGATIVE   Ketones, ur NEGATIVE NEGATIVE mg/dL   Protein, ur NEGATIVE  NEGATIVE mg/dL   Nitrite NEGATIVE NEGATIVE   Leukocytes, UA TRACE (A) NEGATIVE  Urine microscopic-add on  Result Value Ref Range   Squamous Epithelial / LPF 6-30 (A) NONE SEEN   WBC, UA 0-5 0 - 5 WBC/hpf   RBC / HPF 0-5 0 - 5 RBC/hpf   Bacteria, UA MANY (A) NONE SEEN   *Note: Due to a large number of results and/or encounters for the requested time period, some results have not been displayed. A complete set of results can be found in Results Review.    Patient's CK is elevated, hx of same but much lower today than previous.  UA appears infectious, will start keflex and send for culture.  TTS has evaluated patient, psychiatrist to see in the morning to determine disposition as patient has no SI unless in regards to returning home.  Social work has also evaluated patient as she desires to be placed into nursing facility.    Case discussed with attending physician, Dr. Corlis LeakMackuen, who agrees with assessment and plan of care.  Garlon HatchetLisa M Shavelle Runkel, PA-C 09/21/15 0217  Garlon HatchetLisa M Jamaal Bernasconi,  PA-C 09/21/15 0401  Courteney Randall AnLyn Mackuen, MD 09/21/15 [phone]
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
## 2015-09-20 NOTE — BH Assessment (Signed)
Assessment completed.  Consulted with Janann Augustori Burkett, NP who recommends seeking placement at this time and recommended CSW consult to seek placement in long term facility as well. Informed EDP of recommendation.   Davina PokeJoVea Cherlynn Popiel, LCSW Therapeutic Triage Specialist [facility] 09/20/2015 11:28 PM

## 2015-09-21 ENCOUNTER — Emergency Department (HOSPITAL_COMMUNITY): Payer: Medicare Other

## 2015-09-21 DIAGNOSIS — Z01818 Encounter for other preprocedural examination: Secondary | ICD-10-CM

## 2015-09-21 DIAGNOSIS — F332 Major depressive disorder, recurrent severe without psychotic features: Secondary | ICD-10-CM

## 2015-09-21 MED ORDER — GABAPENTIN 300 MG PO CAPS
300.0000 mg | ORAL_CAPSULE | Freq: Once | ORAL | Status: AC
  Administered 2015-09-21: 300 mg via ORAL
  Filled 2015-09-21: qty 1

## 2015-09-21 MED ORDER — HYDROCODONE-ACETAMINOPHEN 5-325 MG PO TABS
1.0000 | ORAL_TABLET | Freq: Once | ORAL | Status: AC
  Administered 2015-09-21: 1 via ORAL
  Filled 2015-09-21: qty 1

## 2015-09-21 MED ORDER — TRAMADOL HCL 50 MG PO TABS
50.0000 mg | ORAL_TABLET | Freq: Once | ORAL | Status: AC
  Administered 2015-09-21: 50 mg via ORAL
  Filled 2015-09-21: qty 1

## 2015-09-21 MED ORDER — CEPHALEXIN 500 MG PO CAPS
500.0000 mg | ORAL_CAPSULE | Freq: Four times a day (QID) | ORAL | Status: DC
  Administered 2015-09-21 – 2015-09-26 (×22): 500 mg via ORAL
  Filled 2015-09-21 (×22): qty 1
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
## 2015-09-21 NOTE — ED Notes (Addendum)
Pt is asleep-respirations are regular and non labored. Pt remains a 1:1/

## 2015-09-21 NOTE — Progress Notes (Signed)
CSW was notified by NP that patient had questions regarding her account and wanting to speak with someone about court.  CSW met with patient at bedside. Sitter was present. Patient states that she is on probation, and would like to speak with her probation officer to inform him where she is and speak with him about possible court date. Patient informed CSW that she has a bible and that contact information for her parole officer would be in her locked up belongings. CSW spoke with nurse who states that patient has already been allowed to search for number, but there was nothing there.  CSW made NP aware of situation.  Patient was pleasant and cooperative.   Willette Brace [phone] ED CSW 09/21/2015 7:42 PM

## 2015-09-21 NOTE — BH Assessment (Signed)
[facility] Assessment Progress Note  The following facilities have been contacted to seek placement for this pt, with results as noted:  Beds available, information sent, decision pending[facility]   No beds available, but accepting referrals for future consideration; information sent[facility]  [facility]   Declined:  [facility] (due to chronicity)   [facility]  [facility]   Doylene Canninghomas Jullian Clayson, [place] Triage Specialist [phone]

## 2015-09-21 NOTE — ED Notes (Signed)
[facility]
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
## 2015-09-22 LAB — URINE CULTURE

## 2015-09-22 LAB — I-STAT CHEM 8, ED
BUN: 10 mg/dL (ref 6–20)
CHLORIDE: 98 mmol/L — AB (ref 101–111)
Calcium, Ion: 1.21 mmol/L (ref 1.13–1.30)
Creatinine, Ser: 0.9 mg/dL (ref 0.44–1.00)
Glucose, Bld: 108 mg/dL — ABNORMAL HIGH (ref 65–99)
HEMATOCRIT: 36 % (ref 36.0–46.0)
Hemoglobin: 12.2 g/dL (ref 12.0–15.0)
Potassium: 3.9 mmol/L (ref 3.5–5.1)
SODIUM: 141 mmol/L (ref 135–145)
TCO2: 34 mmol/L (ref 0–100)

## 2015-09-22 LAB — PROTIME-INR
INR: 1.38 (ref 0.00–1.49)
Prothrombin Time: 17.1 seconds — ABNORMAL HIGH (ref 11.6–15.2)

## 2015-09-22 MED ORDER — HYDROXYZINE HCL 10 MG PO TABS
10.0000 mg | ORAL_TABLET | Freq: Four times a day (QID) | ORAL | Status: DC | PRN
  Filled 2015-09-22: qty 1

## 2015-09-22 MED ORDER — MOMETASONE FUROATE 220 MCG/INH IN AEPB: 2.0000 | INHALATION_SPRAY | Freq: Every day | RESPIRATORY_TRACT | Status: DC

## 2015-09-22 MED ORDER — FLUOXETINE HCL 20 MG PO CAPS
40.0000 mg | ORAL_CAPSULE | Freq: Every day | ORAL | Status: DC
  Administered 2015-09-22 – 2015-09-26 (×5): 40 mg via ORAL
  Filled 2015-09-22 (×5): qty 2

## 2015-09-22 MED ORDER — FLUTICASONE PROPIONATE (INHAL) 250 MCG/BLIST IN AEPB: 2.0000 | INHALATION_SPRAY | RESPIRATORY_TRACT | Status: DC | PRN

## 2015-09-22 MED ORDER — WARFARIN SODIUM 5 MG PO TABS: 5.0000 mg | ORAL_TABLET | Freq: Every day | ORAL | Status: DC

## 2015-09-22 MED ORDER — WARFARIN - PHARMACIST DOSING INPATIENT: Freq: Every day | Status: DC

## 2015-09-22 MED ORDER — WARFARIN SODIUM 10 MG PO TABS
10.0000 mg | ORAL_TABLET | Freq: Once | ORAL | Status: AC
  Administered 2015-09-22: 10 mg via ORAL
  Filled 2015-09-22 (×2): qty 1

## 2015-09-22 MED ORDER — LISINOPRIL 10 MG PO TABS
10.0000 mg | ORAL_TABLET | Freq: Every day | ORAL | Status: DC
  Administered 2015-09-24 – 2015-09-26 (×3): 10 mg via ORAL
  Filled 2015-09-22 (×4): qty 1

## 2015-09-22 MED ORDER — FUROSEMIDE 40 MG PO TABS
20.0000 mg | ORAL_TABLET | Freq: Every day | ORAL | Status: DC
  Administered 2015-09-23: 40 mg via ORAL
  Administered 2015-09-26: 20 mg via ORAL
  Filled 2015-09-22 (×2): qty 1

## 2015-09-22 MED ORDER — ATENOLOL 25 MG PO TABS
25.0000 mg | ORAL_TABLET | Freq: Every day | ORAL | Status: DC
  Administered 2015-09-23 – 2015-09-26 (×4): 25 mg via ORAL
  Filled 2015-09-22 (×4): qty 1

## 2015-09-22 MED ORDER — HYDROXYZINE HCL 25 MG PO TABS: 25.0000 mg | ORAL_TABLET | Freq: Every day | ORAL | Status: DC | PRN

## 2015-09-22 MED ORDER — ATORVASTATIN CALCIUM 10 MG PO TABS
10.0000 mg | ORAL_TABLET | Freq: Every day | ORAL | Status: DC
  Administered 2015-09-22 – 2015-09-25 (×4): 10 mg via ORAL
  Filled 2015-09-22 (×6): qty 1

## 2015-09-22 MED ORDER — GABAPENTIN 300 MG PO CAPS
300.0000 mg | ORAL_CAPSULE | Freq: Three times a day (TID) | ORAL | Status: DC
  Administered 2015-09-22 – 2015-09-26 (×12): 300 mg via ORAL
  Filled 2015-09-22 (×12): qty 1

## 2015-09-22 MED ORDER — ALBUTEROL SULFATE HFA 108 (90 BASE) MCG/ACT IN AERS: 2.0000 | INHALATION_SPRAY | Freq: Four times a day (QID) | RESPIRATORY_TRACT | Status: DC | PRN

## 2015-09-22 MED ORDER — FLUTICASONE PROPIONATE HFA 220 MCG/ACT IN AERO
2.0000 | INHALATION_SPRAY | Freq: Every day | RESPIRATORY_TRACT | Status: DC
  Administered 2015-09-24 – 2015-09-26 (×2): 2 via RESPIRATORY_TRACT
  Filled 2015-09-22: qty 12

## 2015-09-22 MED ORDER — TRAMADOL HCL 50 MG PO TABS
50.0000 mg | ORAL_TABLET | Freq: Three times a day (TID) | ORAL | Status: DC
  Administered 2015-09-22 – 2015-09-26 (×12): 50 mg via ORAL
  Filled 2015-09-22 (×12): qty 1

## 2015-09-22 MED ORDER — BUSPIRONE HCL 15 MG PO TABS
7.5000 mg | ORAL_TABLET | Freq: Two times a day (BID) | ORAL | Status: DC
  Administered 2015-09-22 – 2015-09-26 (×9): 7.5 mg via ORAL
  Filled 2015-09-22 (×11): qty 1

## 2015-09-22 MED ORDER — CYCLOBENZAPRINE HCL 10 MG PO TABS
10.0000 mg | ORAL_TABLET | Freq: Two times a day (BID) | ORAL | Status: DC | PRN
  Administered 2015-09-24 – 2015-09-26 (×5): 10 mg via ORAL
  Filled 2015-09-22 (×6): qty 1

## 2015-09-22 MED ORDER — AMLODIPINE BESYLATE 5 MG PO TABS
5.0000 mg | ORAL_TABLET | Freq: Every day | ORAL | Status: DC
  Administered 2015-09-22 – 2015-09-26 (×5): 5 mg via ORAL
  Filled 2015-09-22 (×6): qty 1

## 2015-09-22 MED ORDER — POTASSIUM CHLORIDE CRYS ER 20 MEQ PO TBCR
20.0000 meq | EXTENDED_RELEASE_TABLET | Freq: Every day | ORAL | Status: DC
  Administered 2015-09-23 – 2015-09-26 (×4): 20 meq via ORAL
  Filled 2015-09-22 (×4): qty 1

## 2015-09-22 MED ORDER — TRAZODONE HCL 100 MG PO TABS
100.0000 mg | ORAL_TABLET | Freq: Every day | ORAL | Status: DC
  Administered 2015-09-22 – 2015-09-25 (×4): 100 mg via ORAL
  Filled 2015-09-22 (×4): qty 1

## 2015-09-22 NOTE — Progress Notes (Signed)
ANTICOAGULATION CONSULT NOTE - Initial Consult  Pharmacy Consult for Warfarin Indication: Chronic Warfarin for hx PE/DVT  No Known Allergies  Patient Measurements:   Total body weight: 109kg  Vital Signs: Temp: 98.5 F (36.9 C) (12/29 0500) Temp Source: Oral (12/29 0500) BP: 109/63 mmHg (12/29 0500) Pulse Rate: 76 (12/29 0500)  Labs:  Recent Labs  09/20/15 2015  HGB 11.5*  HCT 35.1*  PLT 300  APTT 35  LABPROT 19.4*  INR 1.64*  CREATININE 0.82  CKTOTAL 276*   CrCl cannot be calculated (Unknown ideal weight.).  Medical History: Past Medical History  Diagnosis Date  . Depression   . Schizophrenia (HCC)   . Hyperlipidemia   . Hypertension   . Chronic pain     "over my whole body" (03/30/2013)  . Pulmonary embolism (HCC) 12/2009    Large central bilateral PE's   . DVT (deep venous thrombosis) (HCC)     per 01/17/10 d/c summary- "remote hx of dvt"?  . Iron deficiency anemia   . Glaucoma   . Hemorrhoids   . Asthma   . Anxiety   . GERD (gastroesophageal reflux disease)   . Psoriasis 04/29/2012    New onset evaluated by Dr Marylou FlesherWilliam Huang, Dermat[facility], [facility] 6/13  Rx 0.1% Tacrolimus ointment  . Complication of anesthesia     "because I have sleep apnea" (03/30/2013)  . Heart murmur   . Chest pain, exertional   . Chronic bronchitis (HCC)   . Sleep apnea     "waiting on my CPAP" (03/30/2013)  . Pneumonia     "a few times" (03/30/2013)  . Exertional shortness of breath     "& sometimes when laying down" (03/30/2013)  . Daily headache     "last 2 months" (03/30/2013)  . Arthritis     "joints" (03/30/2013)  . Varicose veins of legs   . Psoriasis    Medications:  Scheduled:  . amLODipine  5 mg Oral Daily  . atorvastatin  10 mg Oral QHS  . busPIRone  7.5 mg Oral BID  . cephALEXin  500 mg Oral 4 times per day  . FLUoxetine  40 mg Oral Daily  . nicotine  21 mg Transdermal Daily  . warfarin  5 mg Oral Daily   Assessment: 74 yoF to ED with joint pain.  Psych history  includes recent admit [facility].  On chronic Warfarin for hx of PE/DVT: 7.5mg  daily.  Last INR from [place] on 12/23 = 1.53 Resume Warfarin, pharmacy to dose. INR 1.64 on 12/27, now 1.38 this am  Goal of Therapy:  INR 2-3 Monitor platelets by anticoagulation protocol: Yes   Plan:   Warfarin 10mg  today  Daily PT/INR  Plan SNF placement  Otho BellowsGreen, Laurelyn Terrero L PharmD Pager [phone] 09/22/2015, 10:50 AM

## 2015-09-22 NOTE — ED Notes (Signed)
Pt refuses tylenol because it is not vicodin. She requested Ativan PO which is allowable based on PRN orders, but she then refused it when she realized it was only a 1mg  dose instead of 2mg . She demands to see a doctor to discuss medicating her chronic pain, and she repeatedly comments that she does not understand why she cannot have vicodin as often as she needs it.  RN explained that meds cannot legally be administered without an order and that there is currently no vicodin available to her, but she does not accept that explanation and continues to request a doctor.  Dr Verdie MosherLiu notified.

## 2015-09-22 NOTE — ED Notes (Signed)
Pt requesting pain medication.  MD notified.

## 2015-09-22 NOTE — Progress Notes (Signed)
CSW spoke with Reyne Dumasammy Blakely, Area Director Care Transition from [facility] regarding a possible placement for patient. After speaking with CSW regarding patient, she was already aware of the patient's history. She stated patient's husband, Marilu FavreClarence was at their facility, however, [facility] was [facility] on today (12/29).   Reyne Dumasammy Blakely spoke with someone from her facility and patient will not be allowed to go to [facility]. She stated she spoke with patient while in the ED and patient stated she does not mind going to [facility]. She stated the facility would need to review all notes before a making a decision.   CSW also spoke with patient's sister, Britta MccreedyBarbara [phone]. CSW spoke patient's sister regarding a bank account and court date patient has spoken of since being in the ED. Patient's sister stated she was not sure on all information, however, she stated she feels patient maybe thinking [facility] has transferred funds for payment to their agency [employer] [employer] Patient's sister also stated patient has a mental health counselor (contact # for counselor, [phone]) through the court system, as CSW asked about patient possibly having a court date. Patient's sister stated she spoke with the mental health counselor this morning.   Patient's sister stated patient does not like to stay alone. Patient's sister stated during the day, patient appears fine, however, at night she exhibits paranoia and she becomes panicky. Patient's sister stated patients major worry is her son and not being able to see her son.   Patient's sister stated patient does have a son, [name]38yoa that is [facility]. Patient's sister stated he has been there since the spring/summer. She stated the son resided with the parents prior to admission. She stated she does not know if patient has a POA.   Patient's sister stated patient has a two bedroom [place] [address] [place], [place]  [id number].   Patient's sister, Britta MccreedyBarbara stated she would like to remain informed of decisions. Contact number listed above. No further questions noted for CSW at this time.   Elenore PaddyLaVonia Lenox Bink, LCSWA [phone] ED CSW 09/22/2015 2:55 PM

## 2015-09-22 NOTE — BH Assessment (Signed)
Re-assessment:   Writer met with patient face to face. She was sitting in a chair watching television. Patient calm and cooperative. She explains that she feels as if she isn't getting good care. Patient sts, "Why haven't I seen a doctor"? Writer explained to patient that this re-assessment would be shared with the [facility] providers. Patient further expresses that she should be seen by a doctor and not staff Oncologist, Social worker, Marine scientist, etc.). Patient uncomfortable with staff walking by her room all day and looking in her room. Sts, "I feel like I'm in jail", "Yall feed me", "Don't communicate with me", and "Those sitters just sit here all day looking at me". Patient threatens to get up and leave if she doesn't receive better care. Patient sts that she has various task to take care of such as packing her home, checking on her son, and also checking on her son.  Patient denies SI, HI, and AVH's.   Writer discussed the above information with Reginold Agent, NP. She recommends that additional information (collateral) is sought from patient's family.

## 2015-09-22 NOTE — NC FL2 (Signed)
[place] MEDICAID FL2 LEVEL OF CARE SCREENING TOOL     IDENTIFICATION  Patient Name: [name] [name] Birthdate: 09/12/1941 Sex: female Admission Date (Current Location): 09/20/2015  [facility] [place]   Facility and Address:  [facility],  [address], [place] [id number]      Provider Number: [id number]  Attending Physician Name and Address:  Provider Default, MD  Relative Name and Phone Number:       Current Level of Care: Hospital Recommended Level of Care: Skilled Nursing Facility Prior Approval Number:    Date Approved/Denied:   PASRR Number:    Discharge Plan: SNF    Current Diagnoses: Patient Active Problem List   Diagnosis Date Noted  . Encounter for preadmission testing   . GAD (generalized anxiety disorder) 09/09/2015  . Major depressive disorder, recurrent, severe without psychotic features (HCC) 04/26/2015  . Osteoarthritis of left knee 10/29/2014  . Back pain 06/09/2013  . Vertigo 05/31/2012  . Psoriasis 04/29/2012  . Chronic anticoagulation 12/11/2011  . Heterozygous factor V Leiden mutation (HCC) 01/18/2011  . Anemia, normocytic normochromic 01/18/2011  . GERD 12/21/2010  . Family history of malignant neoplasm of gastrointestinal tract 12/21/2010  . Iron deficiency anemia, unspecified 12/21/2010  . Varicose veins of both lower extremities with pain 12/15/2010  . Long term current use of anticoagulant therapy 11/30/2010  . History of pulmonary embolism 01/30/2010  . Dyslipidemia 01/10/2010  . Asthma 01/10/2010  . Obesity 09/26/2009  . Major depressive disorder with psychotic features (HCC) 09/26/2009  . Chronic pain syndrome 09/26/2009  . Essential hypertension, benign 09/26/2009    Orientation RESPIRATION BLADDER Height & Weight        (Room Air) Continent   240 lbs.  BEHAVIORAL SYMPTOMS/MOOD NEUROLOGICAL BOWEL NUTRITION STATUS      Continent Diet (Yes, fluid consistency)  AMBULATORY STATUS COMMUNICATION  OF NEEDS Skin   Limited Assist (Weakness in legs) Verbally Normal                       Personal Care Assistance Level of Assistance   (Patient has difficulty dressing/bathing, needs assistance)           Functional Limitations Info  Sight, Hearing, Speech Sight Info: Adequate Hearing Info: Adequate Speech Info: Adequate    SPECIAL CARE FACTORS FREQUENCY                       Contractures      Additional Factors Info  Code Status, Allergies Code Status Info: Full Code Allergies Info: No Known Allergies           Current Medications (09/22/2015):  This is the current hospital active medication list Current Facility-Administered Medications  Medication Dose Route Frequency Provider Last Rate Last Dose  . acetaminophen (TYLENOL) tablet 650 mg  650 mg Oral Q4H PRN Garlon HatchetLisa M Sanders, PA-C   650 mg at 09/22/15 1040  . alum & mag hydroxide-simeth (MAALOX/MYLANTA) 200-200-20 MG/5ML suspension 30 mL  30 mL Oral PRN Garlon HatchetLisa M Sanders, PA-C      . amLODipine (NORVASC) tablet 5 mg  5 mg Oral Daily Cathren LaineKevin Steinl, MD   5 mg at 09/22/15 1041  . atorvastatin (LIPITOR) tablet 10 mg  10 mg Oral QHS Cathren LaineKevin Steinl, MD      . busPIRone (BUSPAR) tablet 7.5 mg  7.5 mg Oral BID Cathren LaineKevin Steinl, MD   7.5 mg at 09/22/15 1040  . cephALEXin (KEFLEX) capsule 500  mg  500 mg Oral 4 times per day Garlon Hatchet, PA-C   500 mg at 09/22/15 1041  . FLUoxetine (PROZAC) capsule 40 mg  40 mg Oral Daily Cathren Laine, MD   40 mg at 09/22/15 1041  . LORazepam (ATIVAN) tablet 1 mg  1 mg Oral Q8H PRN Garlon Hatchet, PA-C   1 mg at 09/22/15 0553  . ondansetron (ZOFRAN) tablet 4 mg  4 mg Oral Q8H PRN Garlon Hatchet, PA-C   4 mg at 09/20/15 2315  . Warfarin - Pharmacist Dosing Inpatient   Does not apply q1800 Otho Bellows, RPH   0  at 09/22/15 1800   Current Outpatient Prescriptions  Medication Sig Dispense Refill  . acetaminophen (TYLENOL) 500 MG tablet Take 1 tablet (500 mg total) by mouth every 6 (six) hours  as needed. (Patient taking differently: Take 500 mg by mouth every 6 (six) hours as needed for mild pain. ) 30 tablet 0  . atenolol (TENORMIN) 25 MG tablet Take 25 mg by mouth daily.    Marland Kitchen atorvastatin (LIPITOR) 10 MG tablet Take 10 mg by mouth at bedtime.    . busPIRone (BUSPAR) 7.5 MG tablet Take 7.5 mg by mouth 2 (two) times daily.    Marland Kitchen FLUoxetine (PROZAC) 40 MG capsule Take 40 mg by mouth daily.    . furosemide (LASIX) 20 MG tablet Take 1 tablet (20 mg total) by mouth daily. 30 tablet 0  . gabapentin (NEURONTIN) 300 MG capsule Take 1 capsule (300 mg total) by mouth 3 (three) times daily. 6 capsule 0  . hydrOXYzine (VISTARIL) 25 MG capsule Take 25 mg by mouth daily as needed for anxiety.    . potassium chloride SA (K-DUR,KLOR-CON) 20 MEQ tablet Take 20 mEq by mouth daily.    . traMADol (ULTRAM) 50 MG tablet Take 50 mg by mouth 3 (three) times daily.     . traZODone (DESYREL) 100 MG tablet Take 100 mg by mouth at bedtime.    Marland Kitchen warfarin (COUMADIN) 5 MG tablet TAKE 1 TABLET BY MOUTH EVERY DAY (Patient taking differently: TAKE 1.5  TABLETS (7.5 mg)  BY MOUTH EVERY DAY) 30 tablet 4  . albuterol (PROVENTIL HFA;VENTOLIN HFA) 108 (90 BASE) MCG/ACT inhaler Inhale 2 puffs into the lungs every 6 (six) hours as needed for wheezing or shortness of breath (wheezing). 6.7 g 1  . amLODipine (NORVASC) 5 MG tablet Take 5 mg by mouth daily.     Marland Kitchen atenolol (TENORMIN) 100 MG tablet TAKE 1 TABLET BY MOUTH EVERY DAY (Patient not taking: Reported on 09/20/2015) 30 tablet 7  . cyclobenzaprine (FLEXERIL) 10 MG tablet Take 1 tablet (10 mg total) by mouth 2 (two) times daily as needed for muscle spasms. 20 tablet 0  . esomeprazole (NEXIUM) 40 MG capsule Take 1 capsule (40 mg total) by mouth daily. 30 capsule 0  . FLUoxetine (PROZAC) 20 MG capsule Take 3 capsules (60 mg total) by mouth daily. (Patient not taking: Reported on 09/08/2015) 90 capsule 11  . fluticasone (FLOVENT HFA) 220 MCG/ACT inhaler Inhale 2 puffs into the  lungs daily.     . Fluticasone Propionate, Inhal, (FLOVENT DISKUS) 250 MCG/BLIST AEPB Inhale 2 puffs into the lungs every 4 (four) hours as needed (for shortness of breath and coughing).    . hydrOXYzine (ATARAX/VISTARIL) 10 MG tablet Take 1 tablet (10 mg total) by mouth every 6 (six) hours as needed for itching (itching). 30 tablet 3  . lisinopril (PRINIVIL,ZESTRIL) 10 MG tablet  Take 10 mg by mouth daily.     Marland Kitchen lisinopril (PRINIVIL,ZESTRIL) 20 MG tablet Take 1 tablet (20 mg total) by mouth daily. (Patient not taking: Reported on 08/28/2015) 90 tablet 3  . meclizine (ANTIVERT) 25 MG tablet Take 1 tablet (25 mg total) by mouth 3 (three) times daily as needed for dizziness. 30 tablet 0  . mometasone (ASMANEX) 220 MCG/INH inhaler Inhale 2 puffs into the lungs daily.    . silver sulfADIAZINE (SILVADENE) 1 % cream APPLY TOPICALLY EVERY DAY 400 g 0  . triamcinolone cream (KENALOG) 0.1 % Apply 1 application topically 2 (two) times daily as needed (for legs). 80 g 3     Discharge Medications: Please see discharge summary for a list of discharge medications.  Relevant Imaging Results:  Relevant Lab Results:   Additional Information    Claudean Severance, LCSW

## 2015-09-22 NOTE — Progress Notes (Signed)
NP stated patient wanted to speak with social work. CSW attempted to speak with patient. TTS at bedside.  Elenore PaddyLaVonia Annaly Skop, LCSWA [phone] ED CSW 09/22/2015 2:02 PM

## 2015-09-22 NOTE — ED Notes (Signed)
Pt is resting comfortably in bed watching tv.  Safety sitter is just outside the room with the door open and pt clearly visible.  No acute distress noted and no needs expressed.

## 2015-09-22 NOTE — NC FL2 (Signed)
[place] MEDICAID FL2 LEVEL OF CARE SCREENING TOOL     IDENTIFICATION  Patient Name: [name] SnowballDora C [name] Birthdate: 09/12/1941 Sex: female Admission Date (Current Location): 09/20/2015  [facility] [place]   Facility and Address:  [facility],  [address], [place] [id number]      Provider Number: [id number]  Attending Physician Name and Address:  Provider Default, MD  Relative Name and Phone Number:       Current Level of Care: Hospital Recommended Level of Care: Skilled Nursing Facility Prior Approval Number:    Date Approved/Denied:   PASRR Number:    Discharge Plan: SNF    Current Diagnoses: Patient Active Problem List   Diagnosis Date Noted  . Encounter for preadmission testing   . GAD (generalized anxiety disorder) 09/09/2015  . Major depressive disorder, recurrent, severe without psychotic features (HCC) 04/26/2015  . Osteoarthritis of left knee 10/29/2014  . Back pain 06/09/2013  . Vertigo 05/31/2012  . Psoriasis 04/29/2012  . Chronic anticoagulation 12/11/2011  . Heterozygous factor V Leiden mutation (HCC) 01/18/2011  . Anemia, normocytic normochromic 01/18/2011  . GERD 12/21/2010  . Family history of malignant neoplasm of gastrointestinal tract 12/21/2010  . Iron deficiency anemia, unspecified 12/21/2010  . Varicose veins of both lower extremities with pain 12/15/2010  . Long term current use of anticoagulant therapy 11/30/2010  . History of pulmonary embolism 01/30/2010  . Dyslipidemia 01/10/2010  . Asthma 01/10/2010  . Obesity 09/26/2009  . Major depressive disorder with psychotic features (HCC) 09/26/2009  . Chronic pain syndrome 09/26/2009  . Essential hypertension, benign 09/26/2009    Orientation RESPIRATION BLADDER Height & Weight        (Room Air) Continent   240 lbs.  BEHAVIORAL SYMPTOMS/MOOD NEUROLOGICAL BOWEL NUTRITION STATUS      Continent Diet (Yes, fluid consistency)  AMBULATORY STATUS COMMUNICATION  OF NEEDS Skin   Limited Assist (Weakness in legs) Verbally Normal                       Personal Care Assistance Level of Assistance   (Patient has difficulty dressing/bathing, needs assistance)           Functional Limitations Info  Sight, Hearing, Speech Sight Info: Adequate Hearing Info: Adequate Speech Info: Adequate    SPECIAL CARE FACTORS FREQUENCY                       Contractures      Additional Factors Info  Code Status, Allergies Code Status Info: Full Code Allergies Info: No Known Allergies           Current Medications (09/22/2015):  This is the current hospital active medication list Current Facility-Administered Medications  Medication Dose Route Frequency Provider Last Rate Last Dose  . acetaminophen (TYLENOL) tablet 650 mg  650 mg Oral Q4H PRN Garlon HatchetLisa M Sanders, PA-C   650 mg at 09/22/15 1040  . alum & mag hydroxide-simeth (MAALOX/MYLANTA) 200-200-20 MG/5ML suspension 30 mL  30 mL Oral PRN Garlon HatchetLisa M Sanders, PA-C      . amLODipine (NORVASC) tablet 5 mg  5 mg Oral Daily Cathren LaineKevin Steinl, MD   5 mg at 09/22/15 1041  . atorvastatin (LIPITOR) tablet 10 mg  10 mg Oral QHS Cathren LaineKevin Steinl, MD      . busPIRone (BUSPAR) tablet 7.5 mg  7.5 mg Oral BID Cathren LaineKevin Steinl, MD   7.5 mg at 09/22/15 1040  . cephALEXin (KEFLEX) capsule 500  mg  500 mg Oral 4 times per day Garlon Hatchet, PA-C   500 mg at 09/22/15 1041  . FLUoxetine (PROZAC) capsule 40 mg  40 mg Oral Daily Cathren Laine, MD   40 mg at 09/22/15 1041  . LORazepam (ATIVAN) tablet 1 mg  1 mg Oral Q8H PRN Garlon Hatchet, PA-C   1 mg at 09/22/15 0553  . ondansetron (ZOFRAN) tablet 4 mg  4 mg Oral Q8H PRN Garlon Hatchet, PA-C   4 mg at 09/20/15 2315  . Warfarin - Pharmacist Dosing Inpatient   Does not apply q1800 Otho Bellows, RPH   0  at 09/22/15 1800   Current Outpatient Prescriptions  Medication Sig Dispense Refill  . acetaminophen (TYLENOL) 500 MG tablet Take 1 tablet (500 mg total) by mouth every 6 (six) hours  as needed. (Patient taking differently: Take 500 mg by mouth every 6 (six) hours as needed for mild pain. ) 30 tablet 0  . atenolol (TENORMIN) 25 MG tablet Take 25 mg by mouth daily.    Marland Kitchen atorvastatin (LIPITOR) 10 MG tablet Take 10 mg by mouth at bedtime.    . busPIRone (BUSPAR) 7.5 MG tablet Take 7.5 mg by mouth 2 (two) times daily.    Marland Kitchen FLUoxetine (PROZAC) 40 MG capsule Take 40 mg by mouth daily.    . furosemide (LASIX) 20 MG tablet Take 1 tablet (20 mg total) by mouth daily. 30 tablet 0  . gabapentin (NEURONTIN) 300 MG capsule Take 1 capsule (300 mg total) by mouth 3 (three) times daily. 6 capsule 0  . hydrOXYzine (VISTARIL) 25 MG capsule Take 25 mg by mouth daily as needed for anxiety.    . potassium chloride SA (K-DUR,KLOR-CON) 20 MEQ tablet Take 20 mEq by mouth daily.    . traMADol (ULTRAM) 50 MG tablet Take 50 mg by mouth 3 (three) times daily.     . traZODone (DESYREL) 100 MG tablet Take 100 mg by mouth at bedtime.    Marland Kitchen warfarin (COUMADIN) 5 MG tablet TAKE 1 TABLET BY MOUTH EVERY DAY (Patient taking differently: TAKE 1.5  TABLETS (7.5 mg)  BY MOUTH EVERY DAY) 30 tablet 4  . albuterol (PROVENTIL HFA;VENTOLIN HFA) 108 (90 BASE) MCG/ACT inhaler Inhale 2 puffs into the lungs every 6 (six) hours as needed for wheezing or shortness of breath (wheezing). 6.7 g 1  . amLODipine (NORVASC) 5 MG tablet Take 5 mg by mouth daily.     Marland Kitchen atenolol (TENORMIN) 100 MG tablet TAKE 1 TABLET BY MOUTH EVERY DAY (Patient not taking: Reported on 09/20/2015) 30 tablet 7  . cyclobenzaprine (FLEXERIL) 10 MG tablet Take 1 tablet (10 mg total) by mouth 2 (two) times daily as needed for muscle spasms. 20 tablet 0  . esomeprazole (NEXIUM) 40 MG capsule Take 1 capsule (40 mg total) by mouth daily. 30 capsule 0  . FLUoxetine (PROZAC) 20 MG capsule Take 3 capsules (60 mg total) by mouth daily. (Patient not taking: Reported on 09/08/2015) 90 capsule 11  . fluticasone (FLOVENT HFA) 220 MCG/ACT inhaler Inhale 2 puffs into the  lungs daily.     . Fluticasone Propionate, Inhal, (FLOVENT DISKUS) 250 MCG/BLIST AEPB Inhale 2 puffs into the lungs every 4 (four) hours as needed (for shortness of breath and coughing).    . hydrOXYzine (ATARAX/VISTARIL) 10 MG tablet Take 1 tablet (10 mg total) by mouth every 6 (six) hours as needed for itching (itching). 30 tablet 3  . lisinopril (PRINIVIL,ZESTRIL) 10 MG tablet  Take 10 mg by mouth daily.     Marland Kitchen lisinopril (PRINIVIL,ZESTRIL) 20 MG tablet Take 1 tablet (20 mg total) by mouth daily. (Patient not taking: Reported on 08/28/2015) 90 tablet 3  . meclizine (ANTIVERT) 25 MG tablet Take 1 tablet (25 mg total) by mouth 3 (three) times daily as needed for dizziness. 30 tablet 0  . mometasone (ASMANEX) 220 MCG/INH inhaler Inhale 2 puffs into the lungs daily.    . silver sulfADIAZINE (SILVADENE) 1 % cream APPLY TOPICALLY EVERY DAY 400 g 0  . triamcinolone cream (KENALOG) 0.1 % Apply 1 application topically 2 (two) times daily as needed (for legs). 80 g 3     Discharge Medications: Please see discharge summary for a list of discharge medications.  Relevant Imaging Results:  Relevant Lab Results:   Additional Information    Claudean Severance, LCSW

## 2015-09-22 NOTE — BH Assessment (Signed)
[facility] Assessment Progress Note   The following facilities have been contacted to seek placement for this pt, with results as noted:  Beds available, information sent, decision pending:  [name]    No beds available, but accepting referrals for future consideration; information sent:  [name]   Declined:  [facility] (medical acuity)   [facility]  [facility]   Doylene Canninghomas Tyshea Imel, [place] Triage Specialist [phone]
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
## 2015-09-22 NOTE — ED Notes (Signed)
Lab delay - pt in shower.

## 2015-09-22 NOTE — Progress Notes (Signed)
Pt's husband, clarence  [facility]/15.

## 2015-09-23 LAB — PROTIME-INR
INR: 1.25 (ref 0.00–1.49)
Prothrombin Time: 15.9 seconds — ABNORMAL HIGH (ref 11.6–15.2)

## 2015-09-23 MED ORDER — WARFARIN SODIUM 7.5 MG PO TABS
7.5000 mg | ORAL_TABLET | Freq: Once | ORAL | Status: AC
  Administered 2015-09-23: 7.5 mg via ORAL
  Filled 2015-09-23 (×2): qty 1

## 2015-09-23 NOTE — ED Notes (Signed)
Bed: WA09 Expected date:  Expected time:  Means of arrival:  Comments: RM 32

## 2015-09-23 NOTE — ED Notes (Signed)
Pt continues to rest comfortably with equal chest rise and fall noted.  Safety sitter is present at the bedside.

## 2015-09-23 NOTE — ED Provider Notes (Signed)
  Physical Exam  BP 122/58 mmHg  Pulse 79  Temp(Src) 98.7 F (37.1 C) (Oral)  Resp 15  SpO2 96%  LMP 11/22/1996  Physical Exam  ED Course  Procedures  MDM Patient was inadvertently given 40 mg of Lasix instead of the 20 mg dose for today. Will hold tomorrow's dose and will hold off on the lisinopril today.      Benjiman CoreNathan Lennon Boutwell, MD 09/23/15 [phone]

## 2015-09-23 NOTE — Evaluation (Signed)
Physical Therapy Evaluation Patient Details Name: [name] [name] MRN: [id number] DOB: 1941-06-23 Today's Date: 09/23/2015   History of Present Illness  74 y.o. female patient presented to ED with Depression, Anxiety, Suicidal ideation  Clinical Impression  Pt admitted with above diagnosis. Pt currently with functional limitations due to the deficits listed below (see PT Problem List). Pt will benefit from skilled PT to increase their independence and safety with mobility to allow discharge to the venue listed below.   Pt able to mobilize today min/guard-supervision level and did not require physical assist.  No unsteadiness observed with gait however pt uses SPC at baseline and decreased gait velocity observed which increases pt's fall risk.  Pt reports she will not use RW because she believes it's more dangerous option vs SPC.   Pt appears to perceive her mobility as more limited then reality.  Per Psychiatry note: "we will be seeking placement at a Gero-psychiatry unit."  Feel pt would be able to mobilize around unit with SPC upon d/c.     Follow Up Recommendations Supervision for mobility/OOB;Home health PT (if does not d/c to geriatric psych unit)    Equipment Recommendations       Recommendations for Other Services       Precautions / Restrictions Precautions Precautions: Fall      Mobility  Bed Mobility Overal bed mobility: Needs Assistance Bed Mobility: Supine to Sit;Sit to Supine     Supine to sit: Supervision Sit to supine: Supervision      Transfers Overall transfer level: Needs assistance Equipment used: Straight cane Transfers: Sit to/from Stand Sit to Stand: Min guard;Supervision         General transfer comment: verbal cues for hand placement  Ambulation/Gait Ambulation/Gait assistance: Min guard Ambulation Distance (Feet): 200 Feet Assistive device: Straight cane Gait Pattern/deviations: Step-through pattern;Decreased stride length Gait velocity:  0.511 Gait velocity interpretation: <1.8 ft/sec, indicative of risk for recurrent falls General Gait Details: min/guard for safety however no buckling or unsteady gait observed at this time however very slow gait speed  Stairs            Wheelchair Mobility    Modified Rankin (Stroke Patients Only)       Balance Overall balance assessment: History of Falls (reports increased falls at home "ankles give out")                                           Pertinent Vitals/Pain Pain Assessment: 0-10 (not rated) Pain Location: asking for more pain meds upon arrival however agreeable to mobilize and did not mention pain again during session    Home Living Family/patient expects to be discharged to:: Private residence Living Arrangements: Alone   Type of Home: House Home Access: Ramped entrance     Home Layout: One level Home Equipment: Environmental consultant - 2 wheels;Cane - single point      Prior Function Level of Independence: Independent with assistive device(s)         Comments: uses cane at home, does not like to use RW     Hand Dominance        Extremity/Trunk Assessment               Lower Extremity Assessment: Generalized weakness (able to perform SLR bilaterally, reports weak/painful ankles)         Communication   Communication: No difficulties  Cognition  Arousal/Alertness: Awake/alert Behavior During Therapy: WFL for tasks assessed/performed Overall Cognitive Status: Within Functional Limits for tasks assessed                      General Comments      Exercises        Assessment/Plan    PT Assessment Patient needs continued PT services  PT Diagnosis Difficulty walking   PT Problem List Decreased activity tolerance;Decreased balance;Decreased mobility  PT Treatment Interventions DME instruction;Gait training;Functional mobility training;Patient/family education;Therapeutic activities;Therapeutic exercise   PT Goals  (Current goals can be found in the Care Plan section) Acute Rehab PT Goals PT Goal Formulation: With patient Time For Goal Achievement: 09/30/15 Potential to Achieve Goals: Good    Frequency Min 2X/week   Barriers to discharge        Co-evaluation               End of Session Equipment Utilized During Treatment: Gait belt Activity Tolerance: Patient tolerated treatment well Patient left: in bed;with call bell/phone within reach;with nursing/sitter in room      Functional Assessment Tool Used: clinical judgement, gait speed Functional Limitation: Mobility: Walking and moving around Mobility: Walking and Moving Around Current Status (Z6109): At least 20 percent but less than 40 percent impaired, limited or restricted Mobility: Walking and Moving Around Goal Status [id number]): At least 1 percent but less than 20 percent impaired, limited or restricted    Time: 0981-1914 PT Time Calculation (min) (ACUTE ONLY): 14 min   Charges:   PT Evaluation $Initial PT Evaluation Tier I: 1 Procedure     PT G Codes:   PT G-Codes **NOT FOR INPATIENT CLASS** Functional Assessment Tool Used: clinical judgement, gait speed Functional Limitation: Mobility: Walking and moving around Mobility: Walking and Moving Around Current Status (N8295): At least 20 percent but less than 40 percent impaired, limited or restricted Mobility: Walking and Moving Around Goal Status [phone]): At least 1 percent but less than 20 percent impaired, limited or restricted    Kassie Keng,KATHrine E 09/23/2015, 3:35 PM Zenovia Jarred, PT, DPT 09/23/2015 Pager: [phone]

## 2015-09-23 NOTE — ED Notes (Signed)
Pt resting comfortably in bed with no signs of acute distress noted.  Safety sitter at bedside.

## 2015-09-23 NOTE — BH Assessment (Signed)
[facility] Assessment Progress Note  The following facilities have been contacted to seek placement for this pt, with results as noted:  Beds available, information sent, decision pending:  [name]   Declined:  [name] (reason unspecified) [name] (lack of criteria) [name] (unit acuity) [name] (medical acuity)   [name] Canninghomas Jazmarie Biever, MA Triage Specialist [phone]

## 2015-09-23 NOTE — ED Notes (Addendum)
Pt is sleeping; safety sitter at the bedside. No acute distress noted.

## 2015-09-23 NOTE — ED Notes (Signed)
Care handoff to receiving nurse.  Belongings remained in locker 32.

## 2015-09-23 NOTE — BH Assessment (Addendum)
[facility] Assessment Progress Note  Per Thedore MinsMojeed Akintayo, MD, this pt continues to require psychiatric hospitalization at this time and [facility] to [facility] should be pursued. At 15:54 Irving Burtonmily at [facility] authorized referral, authorization [id number]. Please note that authorization does not mean that pt has been accepted to the facility. At 15:58 I spoke to Robinette at [facility], who took demographic information by telephone. Referral is being faxed as of this writing.Doylene Canning.  Adilyn Humes, MA Triage Specialist [phone]   Addendum:  At 16:30 Kim at [facility] confirms receipt of referral.  Decision is pending.  Doylene Canninghomas Ryen Rhames, MA Triage Specialist [phone]

## 2015-09-23 NOTE — BH Assessment (Addendum)
Patient was reassessed by TTS.   Patient denies SI and HI at this time. Patient states that she is not currently suicidal but feels that she may not be able to contract for safety. Patient denies HI but states "I don't like my daughter." Patient states that she would not kill her daughter because she would not be able to see her son if she was in jail. Patient denies auditory hallucinations but states that she "sees things." Patient states that she "sees things moving that makes me nervous." Patient states that she is "ready to go" to a facility stating that her son and husband have been admitted to facilities. Patient states that she would like "the right amount of pain medication" and was referred to her nurse for medication questions.   Dr. Jannifer FranklinAkintayo and Nanine MeansJamison Lord, DNP continues to recommend patient be placed in an ALF and look for Geropsych placement as well.    [name] PokeJoVea Monique Gift, LCSW Therapeutic Triage Specialist [facility] 09/23/2015 11:01 AM'
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
## 2015-09-23 NOTE — Progress Notes (Signed)
ANTICOAGULATION CONSULT NOTE   Pharmacy Consult for Warfarin Indication: Chronic Warfarin for hx PE/DVT  No Known Allergies  Patient Measurements:   Total body weight: 109kg  Vital Signs: Temp: 97.3 F (36.3 C) (12/30 1105) Temp Source: Oral (12/30 1105) BP: 153/87 mmHg (12/30 1105) Pulse Rate: 79 (12/30 1105)  Labs:  Recent Labs  09/20/15 2015 09/22/15 0821 09/22/15 1759 09/23/15 0501  HGB 11.5*  --  12.2  --   HCT 35.1*  --  36.0  --   PLT 300  --   --   --   APTT 35  --   --   --   LABPROT 19.4* 17.1*  --  15.9*  INR 1.64* 1.38  --  1.25  CREATININE 0.82  --  0.90  --   CKTOTAL 276*  --   --   --    CrCl cannot be calculated (Unknown ideal weight.).  Medical History: Past Medical History  Diagnosis Date  . Depression   . Schizophrenia (HCC)   . Hyperlipidemia   . Hypertension   . Chronic pain     "over my whole body" (03/30/2013)  . Pulmonary embolism (HCC) 12/2009    Large central bilateral PE's   . DVT (deep venous thrombosis) (HCC)     per 01/17/10 d/c summary- "remote hx of dvt"?  . Iron deficiency anemia   . Glaucoma   . Hemorrhoids   . Asthma   . Anxiety   . GERD (gastroesophageal reflux disease)   . Psoriasis 04/29/2012    New onset evaluated by Dr Marylou Flesher, Dermatology, [facility] 6/13  Rx 0.1% Tacrolimus ointment  . Complication of anesthesia     "because I have sleep apnea" (03/30/2013)  . Heart murmur   . Chest pain, exertional   . Chronic bronchitis (HCC)   . Sleep apnea     "waiting on my CPAP" (03/30/2013)  . Pneumonia     "a few times" (03/30/2013)  . Exertional shortness of breath     "& sometimes when laying down" (03/30/2013)  . Daily headache     "last 2 months" (03/30/2013)  . Arthritis     "joints" (03/30/2013)  . Varicose veins of legs   . Psoriasis    Medications:  Scheduled:  . amLODipine  5 mg Oral Daily  . atenolol  25 mg Oral Daily  . atorvastatin  10 mg Oral QHS  . busPIRone  7.5 mg Oral BID  . cephALEXin  500 mg  Oral 4 times per day  . FLUoxetine  40 mg Oral Daily  . fluticasone  2 puff Inhalation Daily  . furosemide  20 mg Oral Daily  . gabapentin  300 mg Oral TID  . lisinopril  10 mg Oral Daily  . potassium chloride SA  20 mEq Oral Daily  . traMADol  50 mg Oral TID  . traZODone  100 mg Oral QHS  . warfarin  7.5 mg Oral ONCE-1800  . Warfarin - Pharmacist Dosing Inpatient   Does not apply q1800   Assessment: 21 yoF to ED with joint pain.  Psych history includes recent admit [facility] facility.  On chronic Warfarin for hx of PE/DVT: 7.5mg  daily.  Last INR from [facility] on 12/23 = 1.53.  Potential for medication non-compliance due to psychiatric issues. Resume Warfarin, pharmacy to dose. INR 1.64 on 12/27, now 1.38 this am  Goal of Therapy:  INR 2-3  See [facility] for warfarin doses administered during current encounter.  Today, 09/23/2015: No INR response yet, as expected. Diet: regular DDIs with warfarin: none significant at present    Plan:   Warfarin 7.5 mg today at 6pm  Daily PT/INR  Awaiting SNF placement  Elie Goodyandy Shenee Wignall, PharmD, BCPS Pager: [phone] 09/23/2015  11:13 AM

## 2015-09-23 NOTE — ED Notes (Signed)
Safety sitter at bedside; pt is still sleeping

## 2015-09-23 NOTE — ED Notes (Signed)
Completed lab draw without waking pt; she continues to sleep with equal chest rise and fall noted.  Safety sitter at bedside.

## 2015-09-24 LAB — PROTIME-INR
INR: 1.29 (ref 0.00–1.49)
Prothrombin Time: 16.2 seconds — ABNORMAL HIGH (ref 11.6–15.2)

## 2015-09-24 MED ORDER — WARFARIN SODIUM 10 MG PO TABS
10.0000 mg | ORAL_TABLET | Freq: Once | ORAL | Status: AC
  Administered 2015-09-24: 10 mg via ORAL
  Filled 2015-09-24: qty 1

## 2015-09-24 NOTE — ED Notes (Signed)
Previous blank note prior to this one was made in error and was erased.

## 2015-09-24 NOTE — ED Notes (Signed)
Pt's husband has called and pt came to nurse station to talk to him.  NAD noted.  Pt walked with cane but without other assistance.

## 2015-09-24 NOTE — ED Notes (Signed)
Pt had woken up and asked about a shower.  Was instructed she could take one after breakfast and she agreed to this.

## 2015-09-24 NOTE — BH Assessment (Signed)
Patient was reassessed by TTS.   Patient was in her room eating her lunch. Patient states that she is not getting what she needs and wants to get more medication for her pain. Patient states that she is suicidal because she has to go back to her home and is "facing a lot." When asked if she would be suicidal if she was placed in a facility patient states "no." Patient states that she would like to "bring the new year in - in a new place." Patient states that she is not currently homicidal at this time.  Patient denies AVH at this time.    Patient is recommended for inpatient or CSW placement by psychiatry at this time.    [name] PokeJoVea Yousuf Ager, LCSW Therapeutic Triage Specialist [facility] 09/24/2015 1:46 PM

## 2015-09-24 NOTE — Progress Notes (Signed)
ANTICOAGULATION CONSULT NOTE   Pharmacy Consult for Warfarin Indication: Chronic Warfarin for hx PE/DVT  No Known Allergies  Patient Measurements: Height: 5\' 5"  (165.1 cm) Weight: 240 lb (108.863 kg) IBW/kg (Calculated) : 57 Total body weight: 109kg  Vital Signs: Temp: 97.6 F (36.4 C) (12/31 1218) Temp Source: Oral (12/31 1218) BP: 145/85 mmHg (12/31 1218) Pulse Rate: 94 (12/31 1218)  Labs:  Recent Labs  09/22/15 0821 09/22/15 1759 09/23/15 0501 09/24/15 0453  HGB  --  12.2  --   --   HCT  --  36.0  --   --   LABPROT 17.1*  --  15.9* 16.2*  INR 1.38  --  1.25 1.29  CREATININE  --  0.90  --   --    Estimated Creatinine Clearance: 69.4 mL/min (by C-G formula based on Cr of 0.9). Medications:  Scheduled:  . amLODipine  5 mg Oral Daily  . atenolol  25 mg Oral Daily  . atorvastatin  10 mg Oral QHS  . busPIRone  7.5 mg Oral BID  . cephALEXin  500 mg Oral 4 times per day  . FLUoxetine  40 mg Oral Daily  . fluticasone  2 puff Inhalation Daily  . furosemide  20 mg Oral Daily  . gabapentin  300 mg Oral TID  . lisinopril  10 mg Oral Daily  . potassium chloride SA  20 mEq Oral Daily  . traMADol  50 mg Oral TID  . traZODone  100 mg Oral QHS  . Warfarin - Pharmacist Dosing Inpatient   Does not apply q1800   Assessment: [age] yoF to ED with joint pain.  Psych history includes recent admit [facility] facility.  On chronic Warfarin for hx of PE/DVT: 7.5mg  daily.  [facility] = 1.53.  Potential for medication non-compliance due to psychiatric issues. Resume Warfarin, pharmacy to dose. INR 1.64 on 12/27, now 1.38 this am  Goal of Therapy:  INR 2-3  [facility] for [facility]farin doses administered during current encounter.  Today, 09/24/2015: No INR response yet despite 10mg  and 7.5mg  the last two days, respectively  Diet: regular DDIs with warfarin: none significant at present  Plan:   Warfarin 10 mg today at 6pm - booster dose tonight in  hopes of getting INR trending up   Daily PT/INR  Awaiting SNF placement  Juliette Alcideustin Kassey Laforest, PharmD, BCPS.   Pager: [phone] 09/24/2015 12:43 PM

## 2015-09-24 NOTE — ED Notes (Signed)
Pt ate 100% of her breakfast

## 2015-09-24 NOTE — ED Notes (Signed)
Pt is now ready to eat breakfast.  Heating her food.  States she will take her meds once she eats.

## 2015-09-24 NOTE — ED Notes (Signed)
Pt has requested getting a vicodin several times throughout the day.  She has been given the prn's that have been ordered and states she has not had any relief.  She is asking to speak to the EDP whom has been made aware.

## 2015-09-24 NOTE — ED Notes (Signed)
Pt alert resting and watching T.V., sitter at the bedside will continue to assess.

## 2015-09-24 NOTE — ED Notes (Signed)
Pt has walked to the nurse's desk with her cane and observed by her sitter saying the pain in her shoulder has not resolved.  Will look through PRN options and medicate prn.

## 2015-09-25 DIAGNOSIS — F332 Major depressive disorder, recurrent severe without psychotic features: Secondary | ICD-10-CM | POA: Diagnosis not present

## 2015-09-25 DIAGNOSIS — Z01818 Encounter for other preprocedural examination: Secondary | ICD-10-CM | POA: Diagnosis not present

## 2015-09-25 LAB — PROTIME-INR
INR: 1.35 (ref 0.00–1.49)
Prothrombin Time: 16.8 seconds — ABNORMAL HIGH (ref 11.6–15.2)

## 2015-09-25 MED ORDER — WARFARIN SODIUM 10 MG PO TABS
10.0000 mg | ORAL_TABLET | Freq: Once | ORAL | Status: AC
  Administered 2015-09-25: 10 mg via ORAL
  Filled 2015-09-25: qty 1

## 2015-09-25 NOTE — Progress Notes (Signed)
ANTICOAGULATION CONSULT NOTE   Pharmacy Consult for Warfarin Indication: Chronic Warfarin for hx PE/DVT  No Known Allergies  Patient Measurements: Height: 5\' 5"  (165.1 cm) Weight: 240 lb (108.863 kg) IBW/kg (Calculated) : 57 Total body weight: 109kg  Vital Signs: Temp: 98.5 F (36.9 C) (01/01 0615) Temp Source: Oral (01/01 0615) BP: 101/64 mmHg (01/01 0618) Pulse Rate: 71 (01/01 0615)  Labs:  Recent Labs  09/22/15 1759 09/23/15 0501 09/24/15 0453  HGB 12.2  --   --   HCT 36.0  --   --   LABPROT  --  15.9* 16.2*  INR  --  1.25 1.29  CREATININE 0.90  --   --    Estimated Creatinine Clearance: 69.4 mL/min (by C-G formula based on Cr of 0.9). Medications:  Scheduled:  . amLODipine  5 mg Oral Daily  . atenolol  25 mg Oral Daily  . atorvastatin  10 mg Oral QHS  . busPIRone  7.5 mg Oral BID  . cephALEXin  500 mg Oral 4 times per day  . FLUoxetine  40 mg Oral Daily  . fluticasone  2 puff Inhalation Daily  . furosemide  20 mg Oral Daily  . gabapentin  300 mg Oral TID  . lisinopril  10 mg Oral Daily  . potassium chloride SA  20 mEq Oral Daily  . traMADol  50 mg Oral TID  . traZODone  100 mg Oral QHS  . Warfarin - Pharmacist Dosing Inpatient   Does not apply q1800   Assessment: [age] yoF to ED with joint pain.  Psych history includes recent admit [facility] facility.  On chronic Warfarin for hx of PE/DVT: 7.5mg  daily.  [facility] = 1.53.  Potential for medication non-compliance due to psychiatric issues. Resume Warfarin, pharmacy to dose. INR 1.64 on 12/27, now 1.38 this am  Goal of Therapy:  INR 2-3  [facility] for [facility] doses administered during current encounter.  Today, 09/25/2015: INR 1.35 Diet: regular DDIs with warfarin: none significant at present  Plan:   Warfarin 10 mg today at 6pm - booster dose again tonight   Daily PT/INR  Awaiting SNF placement  Clance BollAmanda Kaeli Nichelson, PharmD, BCPS Pager:  [phone] 09/25/2015 10:49 AM

## 2015-09-25 NOTE — Consult Note (Signed)
  Psychiatric Specialty Exam: Physical Exam  ROS  Blood pressure 119/68, pulse 72, temperature 97.6 F (36.4 C), temperature source Oral, resp. rate 19, height 5\' 5"  (1.651 m), weight 108.863 kg (240 lb), last menstrual period 11/22/1996, SpO2 93 %.Body mass index is 39.94 kg/(m^2).  General Appearance: Casual  Eye Contact:: Good  Speech: Clear and Coherent and Normal Rate  Volume: Normal  Mood: Angry, Anxious, Depressed, Hopeless and helpless  Affect: Congruent, Depressed and Flat  Thought Process: Coherent, Goal Directed and Intact  Orientation: Full (Time, Place, and Person)  Thought Content: WDL  Suicidal Thoughts: No  Homicidal Thoughts: No  Memory: Immediate; Good Recent; Good Remote; Good  Judgement: Good  Insight: Good  Psychomotor Activity: Normal  Concentration: Good  Recall: Fair  Fund of Knowledge:Good  Language: Good  Akathisia: NA  Handed: Right  AIMS (if indicated):    Assets: Desire for Improvement Housing  ADL's: Intact  Cognition: WNL         Patient is still endorsing suicide if sent home to her apartment.  Patient reports feeling hopeless and helpless.  Patient reports that she cannot care for herself any more in her apartment.  Patient reports that she feels very depressed and was tearful this morning.  She denies AVH and she reports good sleep since coming here but complains of severe lower back pain. Major depressive disorder, recurrent, severe without psychotic features [facility](HCC)   Plan:  Continue plan of care, seek inpatient Psychiatric admission placement.  SW consult  for placement  [name] ByesJosephine [name]   PMHNP-BC Agree with NP Assessment and Plan as above

## 2015-09-25 NOTE — ED Notes (Signed)
MD at bedside.  Pt sts she is SI, pt sts she needs to find placement.  Pt seems depressed & have chronic back pain, pt is eating & sleeping, pt took shower this am

## 2015-09-25 NOTE — ED Notes (Addendum)
Phone EDP as pt is inquiring about getting her vicodin for bilateral knee pain. EDP will evaluate the pt. Pt appears depressed this pm. She does have a sitter and does contract for safety . 9:15pm_pt was given heat packs for both knees and her neck.She was also given flexeril for muscle spasms in her legs and 50mg  of tramadol for pain in both knees and joint pain all over. Pts husband called to chat with the pt. (9:20pm)10:20pm Pt requested the MD come and see her and order her vicodin. Informed pt MD is aware of the pts request. Pt states she takes vicodin tid at home. P. Phoned [facility] [phone].Per pharmacy pt only has a RX for tramadol. 06/17/14 pt did have a RX hydrocodone.

## 2015-09-26 DIAGNOSIS — F330 Major depressive disorder, recurrent, mild: Secondary | ICD-10-CM | POA: Diagnosis present

## 2015-09-26 LAB — URINALYSIS, ROUTINE W REFLEX MICROSCOPIC
Bilirubin Urine: NEGATIVE
GLUCOSE, UA: NEGATIVE mg/dL
HGB URINE DIPSTICK: NEGATIVE
Ketones, ur: NEGATIVE mg/dL
LEUKOCYTES UA: NEGATIVE
Nitrite: NEGATIVE
PH: 6.5 (ref 5.0–8.0)
PROTEIN: NEGATIVE mg/dL
Specific Gravity, Urine: 1.006 (ref 1.005–1.030)

## 2015-09-26 LAB — PROTIME-INR
INR: 1.55 — AB (ref 0.00–1.49)
PROTHROMBIN TIME: 18.6 s — AB (ref 11.6–15.2)

## 2015-09-26 MED ORDER — WARFARIN SODIUM 10 MG PO TABS
10.0000 mg | ORAL_TABLET | Freq: Once | ORAL | Status: AC
  Administered 2015-09-26: 10 mg via ORAL
  Filled 2015-09-26 (×2): qty 1

## 2015-09-26 MED ORDER — CEPHALEXIN 500 MG PO CAPS: 500.0000 mg | ORAL_CAPSULE | Freq: Four times a day (QID) | ORAL | Status: DC

## 2015-09-26 MED ORDER — PANTOPRAZOLE SODIUM 40 MG PO TBEC
40.0000 mg | DELAYED_RELEASE_TABLET | Freq: Two times a day (BID) | ORAL | Status: DC
  Administered 2015-09-26: 40 mg via ORAL
  Filled 2015-09-26: qty 1
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
## 2015-09-26 NOTE — Progress Notes (Signed)
These two facilities have both declined patient: 1.  [facility] 2. [facility]. All other facilities are still pending at this time.   [name] PaddyLaVonia Janzen Sacks, LCSWA [phone] ED CSW 09/26/2015 8:06 AM

## 2015-09-26 NOTE — Progress Notes (Signed)
CSW spoke with Steward DroneBrenda, Interior and spatial designerDirector, [facility], [address], [place], [place] [phone]. Director stated she had spoken with someone here regarding patient. Director stated they are trying to send someone tomorrow and more than likely, someone will visit patient tomorrow. Director stated they would not need any paperwork upon arrival. Director stated they just want to assess patient and speak with those who have been working with patient since being in the ED.   No questions noted for CSW at this time.   Elenore PaddyLaVonia Mika Anastasi, LCSWA [phone] ED CSW 09/26/2015 3:05 PM

## 2015-09-26 NOTE — Progress Notes (Signed)
CSW staffed with CSW Asst. Director, NP, CM, and [place] CSW.   NP stated she wants patient to be able to care for herself if discharged home.   [place] CSW stated [place] [facility] [place] [facility].  CSW spoke with patient to see inquire about her monthly income. Patient stated when she was receiving her son's money along with hers, she was receiving "a little over a thousand" monthly. Patient stated now she no longer receives her son's money and she is only receiving six hundred monthly in social security.  [name] [name], LCSWA [phone] ED CSW 09/26/2015 4:22 PM

## 2015-09-26 NOTE — BH Assessment (Addendum)
Patient was reassessed by TTS.   Patient states that she "feels so good" that she "doesn't have to keep house anymore, I'm so happy." Patient states that she feels safe if she is going into a facility. Patient denies HI at this time. Patient states that she has auditory hallucinations stating that she hears people say her name at times. She states that she "turns around to talk to them and no one is there." Patient denies VH. Patient does not appear to be responding to internal stimuli.    Patient has been psychiatrically cleared and is CSW placement per Nanine MeansJamison Lord, DNP.   Davina PokeJoVea Derral Colucci, LCSW Therapeutic Triage Specialist [facility] 09/26/2015 11:42 AM

## 2015-09-26 NOTE — Progress Notes (Signed)
Pt with 17 ED visits states she is "not going home", "want to go a facility"  CM inquired about home health, pcp, caregiver but needed frequent redirection and to returned to the subject of home health.  Pt discussed her pain management, her husband, her family not living near her, [place]  Pt noted to get verbally aggressive when redirected back to home health  Pt confirms she has no support system other than husband, son, pcp [facility] and she "go out to get lunch in the cab"  CM reviewed in details medicare guidelines, home health [facility](HH) (length of stay in home, [facility], coverage, primary caregiver, up to 24 hrs before services may be started) and Private duty nursing (PDN-coverage, length of stay in the home types of staff available). CM reviewed availability of HH SW to assist pcp to get pt to snf (if desired disposition) from the community level. CM provided pt/family with a list of [place] home health agencies and PDN.    Pt came out of her room with her cane in her hand Not using it to walk to TCU nursing station Security called after pt threaten to hit

## 2015-09-26 NOTE — ED Notes (Signed)
Pt informed need a urine specimen as ordered by Dr. Nicanor AlconPalumbo.  Pt verbalized understanding.  Urine cup provided.

## 2015-09-26 NOTE — ED Notes (Signed)
Patient's sister called to verbalize her concerns that we are discharging patient and not providing transportation. I reinforced to the sister that the patient requested to be discharged and that we were providing her with a bus pass.

## 2015-09-26 NOTE — ED Notes (Signed)
Pt c/o burning/itching in genitalia.  Informed would speak to Dr. Nicanor AlconPalumbo.

## 2015-09-26 NOTE — ED Notes (Signed)
A representative called from [facility] to discuss patient's needs and potential placement. She said that they would send someone to meet with patient tomorrow 09/27/15.

## 2015-09-26 NOTE — Discharge Instructions (Signed)
Major Depressive Disorder Major depressive disorder is a mental illness. It also may be called clinical depression or unipolar depression. Major depressive disorder usually causes feelings of sadness, hopelessness, or helplessness. Some people with this disorder do not feel particularly sad but lose interest in doing things they used to enjoy (anhedonia). Major depressive disorder also can cause physical symptoms. It can interfere with work, school, relationships, and other normal everyday activities. The disorder varies in severity but is longer lasting and more serious than the sadness we all feel from time to time in our lives. Major depressive disorder often is triggered by stressful life events or major life changes. Examples of these triggers include divorce, loss of your job or home, a move, and the death of a family member or close friend. Sometimes this disorder occurs for no obvious reason at all. People who have family members with major depressive disorder or bipolar disorder are at higher risk for developing this disorder, with or without life stressors. Major depressive disorder can occur at any age. It may occur just once in your life (single episode major depressive disorder). It may occur multiple times (recurrent major depressive disorder). SYMPTOMS People with major depressive disorder have either anhedonia or depressed mood on nearly a daily basis for at least 2 weeks or longer. Symptoms of depressed mood include:  Feelings of sadness (blue or down in the dumps) or emptiness.  Feelings of hopelessness or helplessness.  Tearfulness or episodes of crying (may be observed by others).  Irritability (children and adolescents). In addition to depressed mood or anhedonia or both, people with this disorder have at least four of the following symptoms:  Difficulty sleeping or sleeping too much.   Significant change (increase or decrease) in appetite or weight.   Lack of energy or  motivation.  Feelings of guilt and worthlessness.   Difficulty concentrating, remembering, or making decisions.  Unusually slow movement (psychomotor retardation) or restlessness (as observed by others).   Recurrent wishes for death, recurrent thoughts of self-harm (suicide), or a suicide attempt. People with major depressive disorder commonly have persistent negative thoughts about themselves, other people, and the world. People with severe major depressive disorder may experiencedistorted beliefs or perceptions about the world (psychotic delusions). They also may see or hear things that are not real (psychotic hallucinations). DIAGNOSIS Major depressive disorder is diagnosed through an assessment by your health care provider. Your health care provider will ask aboutaspects of your daily life, such as mood,sleep, and appetite, to see if you have the diagnostic symptoms of major depressive disorder. Your health care provider may ask about your medical history and use of alcohol or drugs, including prescription medicines. Your health care provider also may do a physical exam and blood work. This is because certain medical conditions and the use of certain substances can cause major depressive disorder-like symptoms (secondary depression). Your health care provider also may refer you to a mental health specialist for further evaluation and treatment. TREATMENT It is important to recognize the symptoms of major depressive disorder and seek treatment. The following treatments can be prescribed for this disorder:   Medicine. Antidepressant medicines usually are prescribed. Antidepressant medicines are thought to correct chemical imbalances in the brain that are commonly associated with major depressive disorder. Other types of medicine may be added if the symptoms do not respond to antidepressant medicines alone or if psychotic delusions or hallucinations occur.  Talk therapy. Talk therapy can be  helpful in treating major depressive disorder by providing   support, education, and guidance. Certain types of talk therapy also can help with negative thinking (cognitive behavioral therapy) and with relationship issues that trigger this disorder (interpersonal therapy). A mental health specialist can help determine which treatment is best for you. Most people with major depressive disorder do well with a combination of medicine and talk therapy. Treatments involving electrical stimulation of the brain can be used in situations with extremely severe symptoms or when medicine and talk therapy do not work over time. These treatments include electroconvulsive therapy, transcranial magnetic stimulation, and vagal nerve stimulation.   This information is not intended to replace advice given to you by your health care provider. Make sure you discuss any questions you have with your health care provider.   Document Released: 01/05/2013 Document Revised: 10/01/2014 Document Reviewed: 01/05/2013 Elsevier Interactive Patient Education 2016 Elsevier Inc.  

## 2015-09-26 NOTE — ED Notes (Addendum)
Called to patient's room. She requested to be discharged. Verbalizes that she plans to go to a shelter rather than return to her apartment and that she has 'given up' her apartment due to expense and she does not want to live with her family. Patient's wishes conveyed to Nanine MeansJamison Lord, NP.

## 2015-09-26 NOTE — Progress Notes (Signed)
ANTICOAGULATION CONSULT NOTE   Pharmacy Consult for Warfarin Indication: Chronic Warfarin for hx PE/DVT  No Known Allergies  Patient Measurements: Height: 5\' 5"  (165.1 cm) Weight: 240 lb (108.863 kg) IBW/kg (Calculated) : 57 Total body weight: 109kg  Vital Signs: Temp: 98.1 F (36.7 C) (01/02 0422) Temp Source: Oral (01/02 0422) BP: 121/63 mmHg (01/02 0422) Pulse Rate: 71 (01/02 0422)  Labs:  Recent Labs  09/24/15 0453 09/25/15 1241 09/26/15 0445  LABPROT 16.2* 16.8* 18.6*  INR 1.29 1.35 1.55*   Estimated Creatinine Clearance: 69.4 mL/min (by C-G formula based on Cr of 0.9). Medications:  Scheduled:  . amLODipine  5 mg Oral Daily  . atenolol  25 mg Oral Daily  . atorvastatin  10 mg Oral QHS  . busPIRone  7.5 mg Oral BID  . cephALEXin  500 mg Oral 4 times per day  . FLUoxetine  40 mg Oral Daily  . fluticasone  2 puff Inhalation Daily  . furosemide  20 mg Oral Daily  . gabapentin  300 mg Oral TID  . lisinopril  10 mg Oral Daily  . potassium chloride SA  20 mEq Oral Daily  . traMADol  50 mg Oral TID  . traZODone  100 mg Oral QHS  . Warfarin - Pharmacist Dosing Inpatient   Does not apply q1800   Assessment: [age] yoF to ED with joint pain.  Psych history includes recent admit [facility].  On chronic Warfarin for hx of PE/DVT: 7.5mg  daily.  [facility] = 1.53.  Potential for medication non-compliance due to psychiatric issues.  Resume Warfarin, pharmacy to dose. INR 1.64 on 12/27, now 1.38 this am  Goal of Therapy:  INR 2-3  [facility] administered during current encounter.  Today, 09/26/2015: INR beginning to respond  Diet: regular DDIs with warfarin: none significant at present  Plan:   Warfarin 10 mg x1 at 6pm  Daily PT/INR  Awaiting inpatient psychiatric placement  Elie Goodyandy Charbel Los, PharmD, BCPS Pager: [phone] 09/26/2015  7:35 AM

## 2015-09-26 NOTE — Progress Notes (Addendum)
Discussed pt with ED SW. Pt had a facility voiced interest today.  Facility will visit pt on Tuesday September 27 2015  Pt not qualify for home health Pt is not home bound Pt states she gets in cab and goes out to eat at intervals, no primary caregiver (spouse in facility and [place] [facility]) [facility]  CM will offer pt a list of private duty nursing   Pt states she gets about $600/month of SSI

## 2015-09-26 NOTE — BH Assessment (Addendum)
[facility] Assessment Progress Note  Despite the fact that [facility] for this pt at 16:30 on Friday, 09/23/2015, today [facility] reports that they do not have this referral.  He has taken demographic information and as of this writing, referral information is being re-faxed.  Doylene Canninghomas Khilynn Borntreger, MA Triage Specialist [phone]   Addendum:  At 11:23 [facility] confirms that they have received referral information and that they acre currently reviewing it.  Doylene Canninghomas Columbia Pandey, MA Triage Specialist [phone]   Addendum:  At 11:30 Junious DresserConnie calls back from [facility].  Pt is now on their wait list.  Doylene Canninghomas Nyriah Coote, MA Triage Specialist [phone]

## 2015-09-30 ENCOUNTER — Emergency Department (HOSPITAL_COMMUNITY)
Admission: EM | Admit: 2015-09-30 | Discharge: 2015-10-02 | Disposition: A | Discharge status: Home / Self Care | Payer: Medicare Other | Attending: Emergency Medicine | Admitting: Emergency Medicine

## 2015-09-30 ENCOUNTER — Encounter (HOSPITAL_COMMUNITY): Payer: Self-pay | Admitting: Emergency Medicine

## 2015-09-30 DIAGNOSIS — E785 Hyperlipidemia, unspecified: Secondary | ICD-10-CM | POA: Diagnosis not present

## 2015-09-30 DIAGNOSIS — F419 Anxiety disorder, unspecified: Secondary | ICD-10-CM | POA: Diagnosis not present

## 2015-09-30 DIAGNOSIS — F329 Major depressive disorder, single episode, unspecified: Secondary | ICD-10-CM | POA: Insufficient documentation

## 2015-09-30 DIAGNOSIS — G4730 Sleep apnea, unspecified: Secondary | ICD-10-CM | POA: Diagnosis not present

## 2015-09-30 DIAGNOSIS — Z046 Encounter for general psychiatric examination, requested by authority: Secondary | ICD-10-CM | POA: Diagnosis present

## 2015-09-30 DIAGNOSIS — Z86718 Personal history of other venous thrombosis and embolism: Secondary | ICD-10-CM | POA: Insufficient documentation

## 2015-09-30 DIAGNOSIS — F209 Schizophrenia, unspecified: Secondary | ICD-10-CM | POA: Diagnosis not present

## 2015-09-30 DIAGNOSIS — F1110 Opioid abuse, uncomplicated: Secondary | ICD-10-CM | POA: Diagnosis not present

## 2015-09-30 DIAGNOSIS — Z8701 Personal history of pneumonia (recurrent): Secondary | ICD-10-CM | POA: Insufficient documentation

## 2015-09-30 DIAGNOSIS — M1990 Unspecified osteoarthritis, unspecified site: Secondary | ICD-10-CM | POA: Insufficient documentation

## 2015-09-30 DIAGNOSIS — R000 Tachycardia, unspecified: Secondary | ICD-10-CM | POA: Diagnosis not present

## 2015-09-30 DIAGNOSIS — R011 Cardiac murmur, unspecified: Secondary | ICD-10-CM | POA: Insufficient documentation

## 2015-09-30 DIAGNOSIS — K219 Gastro-esophageal reflux disease without esophagitis: Secondary | ICD-10-CM | POA: Insufficient documentation

## 2015-09-30 DIAGNOSIS — Z7901 Long term (current) use of anticoagulants: Secondary | ICD-10-CM | POA: Insufficient documentation

## 2015-09-30 DIAGNOSIS — R45851 Suicidal ideations: Secondary | ICD-10-CM | POA: Diagnosis not present

## 2015-09-30 DIAGNOSIS — Z792 Long term (current) use of antibiotics: Secondary | ICD-10-CM | POA: Diagnosis not present

## 2015-09-30 DIAGNOSIS — Z79899 Other long term (current) drug therapy: Secondary | ICD-10-CM | POA: Diagnosis not present

## 2015-09-30 DIAGNOSIS — Z872 Personal history of diseases of the skin and subcutaneous tissue: Secondary | ICD-10-CM | POA: Insufficient documentation

## 2015-09-30 DIAGNOSIS — F4321 Adjustment disorder with depressed mood: Secondary | ICD-10-CM | POA: Diagnosis not present

## 2015-09-30 DIAGNOSIS — I10 Essential (primary) hypertension: Secondary | ICD-10-CM | POA: Diagnosis not present

## 2015-09-30 DIAGNOSIS — Z86711 Personal history of pulmonary embolism: Secondary | ICD-10-CM | POA: Insufficient documentation

## 2015-09-30 DIAGNOSIS — J45909 Unspecified asthma, uncomplicated: Secondary | ICD-10-CM | POA: Diagnosis not present

## 2015-09-30 DIAGNOSIS — Z9981 Dependence on supplemental oxygen: Secondary | ICD-10-CM | POA: Insufficient documentation

## 2015-09-30 DIAGNOSIS — Z862 Personal history of diseases of the blood and blood-forming organs and certain disorders involving the immune mechanism: Secondary | ICD-10-CM | POA: Diagnosis not present

## 2015-09-30 DIAGNOSIS — Z7951 Long term (current) use of inhaled steroids: Secondary | ICD-10-CM | POA: Insufficient documentation

## 2015-09-30 DIAGNOSIS — G8929 Other chronic pain: Secondary | ICD-10-CM | POA: Diagnosis not present

## 2015-09-30 LAB — CBC WITH DIFFERENTIAL/PLATELET
BASOS PCT: 0 %
Basophils Absolute: 0 10*3/uL (ref 0.0–0.1)
EOS ABS: 0.1 10*3/uL (ref 0.0–0.7)
Eosinophils Relative: 2 %
HCT: 35.1 % — ABNORMAL LOW (ref 36.0–46.0)
HEMOGLOBIN: 11.5 g/dL — AB (ref 12.0–15.0)
Lymphocytes Relative: 21 %
Lymphs Abs: 1.2 10*3/uL (ref 0.7–4.0)
MCH: 30.7 pg (ref 26.0–34.0)
MCHC: 32.8 g/dL (ref 30.0–36.0)
MCV: 93.9 fL (ref 78.0–100.0)
Monocytes Absolute: 0.4 10*3/uL (ref 0.1–1.0)
Monocytes Relative: 6 %
NEUTROS PCT: 71 %
Neutro Abs: 4 10*3/uL (ref 1.7–7.7)
Platelets: 311 10*3/uL (ref 150–400)
RBC: 3.74 MIL/uL — AB (ref 3.87–5.11)
RDW: 14.4 % (ref 11.5–15.5)
WBC: 5.7 10*3/uL (ref 4.0–10.5)

## 2015-09-30 LAB — URINALYSIS, ROUTINE W REFLEX MICROSCOPIC
BILIRUBIN URINE: NEGATIVE
GLUCOSE, UA: NEGATIVE mg/dL
KETONES UR: 15 mg/dL — AB
Nitrite: NEGATIVE
PH: 6 (ref 5.0–8.0)
Protein, ur: NEGATIVE mg/dL
SPECIFIC GRAVITY, URINE: 1.017 (ref 1.005–1.030)

## 2015-09-30 LAB — COMPREHENSIVE METABOLIC PANEL
ALK PHOS: 106 U/L (ref 38–126)
ALT: 13 U/L — ABNORMAL LOW (ref 14–54)
ANION GAP: 9 (ref 5–15)
AST: 20 U/L (ref 15–41)
Albumin: 3.8 g/dL (ref 3.5–5.0)
BUN: 13 mg/dL (ref 6–20)
CALCIUM: 9.3 mg/dL (ref 8.9–10.3)
CO2: 27 mmol/L (ref 22–32)
Chloride: 104 mmol/L (ref 101–111)
Creatinine, Ser: 0.96 mg/dL (ref 0.44–1.00)
GFR calc non Af Amer: 58 mL/min — ABNORMAL LOW (ref >60)
Glucose, Bld: 97 mg/dL (ref 65–99)
Potassium: 3.9 mmol/L (ref 3.5–5.1)
SODIUM: 140 mmol/L (ref 135–145)
TOTAL PROTEIN: 8 g/dL (ref 6.5–8.1)
Total Bilirubin: 0.4 mg/dL (ref 0.3–1.2)

## 2015-09-30 LAB — URINE MICROSCOPIC-ADD ON

## 2015-09-30 LAB — RAPID URINE DRUG SCREEN, HOSP PERFORMED
AMPHETAMINES: NOT DETECTED
Barbiturates: NOT DETECTED
Benzodiazepines: NOT DETECTED
COCAINE: NOT DETECTED
OPIATES: POSITIVE — AB
TETRAHYDROCANNABINOL: NOT DETECTED

## 2015-09-30 LAB — ETHANOL

## 2015-09-30 MED ORDER — METOPROLOL TARTRATE 1 MG/ML IV SOLN
5.0000 mg | Freq: Once | INTRAVENOUS | Status: AC
  Administered 2015-09-30: 5 mg via INTRAVENOUS
  Filled 2015-09-30: qty 5

## 2015-09-30 MED ORDER — ATENOLOL 25 MG PO TABS
25.0000 mg | ORAL_TABLET | Freq: Every day | ORAL | Status: DC
  Administered 2015-10-01 – 2015-10-02 (×2): 25 mg via ORAL
  Filled 2015-09-30 (×2): qty 1

## 2015-09-30 MED ORDER — GABAPENTIN 300 MG PO CAPS
300.0000 mg | ORAL_CAPSULE | Freq: Three times a day (TID) | ORAL | Status: DC
  Administered 2015-10-01 – 2015-10-02 (×6): 300 mg via ORAL
  Filled 2015-09-30 (×6): qty 1

## 2015-09-30 MED ORDER — ACETAMINOPHEN 500 MG PO TABS
500.0000 mg | ORAL_TABLET | Freq: Four times a day (QID) | ORAL | Status: DC | PRN
  Administered 2015-10-01 – 2015-10-02 (×2): 500 mg via ORAL
  Filled 2015-09-30 (×2): qty 1

## 2015-09-30 MED ORDER — WARFARIN SODIUM 5 MG PO TABS
5.0000 mg | ORAL_TABLET | Freq: Every day | ORAL | Status: DC
  Administered 2015-10-01 (×2): 5 mg via ORAL
  Filled 2015-09-30 (×3): qty 1

## 2015-09-30 MED ORDER — AMLODIPINE BESYLATE 5 MG PO TABS
5.0000 mg | ORAL_TABLET | Freq: Every day | ORAL | Status: DC
  Administered 2015-10-01 – 2015-10-02 (×2): 5 mg via ORAL
  Filled 2015-09-30 (×2): qty 1

## 2015-09-30 MED ORDER — PANTOPRAZOLE SODIUM 40 MG PO TBEC
40.0000 mg | DELAYED_RELEASE_TABLET | Freq: Every day | ORAL | Status: DC
  Administered 2015-10-01 – 2015-10-02 (×2): 40 mg via ORAL
  Filled 2015-09-30: qty 1

## 2015-09-30 MED ORDER — TRAZODONE HCL 100 MG PO TABS
100.0000 mg | ORAL_TABLET | Freq: Every day | ORAL | Status: DC
  Administered 2015-10-01 (×2): 100 mg via ORAL
  Filled 2015-09-30 (×2): qty 1

## 2015-09-30 MED ORDER — ALBUTEROL SULFATE HFA 108 (90 BASE) MCG/ACT IN AERS: 2.0000 | INHALATION_SPRAY | Freq: Four times a day (QID) | RESPIRATORY_TRACT | Status: DC | PRN

## 2015-09-30 MED ORDER — POTASSIUM CHLORIDE CRYS ER 20 MEQ PO TBCR
20.0000 meq | EXTENDED_RELEASE_TABLET | Freq: Every day | ORAL | Status: DC
Start: 2015-10-01 — End: 2015-10-02
  Administered 2015-10-01 – 2015-10-02 (×2): 20 meq via ORAL
  Filled 2015-09-30 (×2): qty 1

## 2015-09-30 MED ORDER — SODIUM CHLORIDE 0.9 % IV BOLUS (SEPSIS)
1000.0000 mL | Freq: Once | INTRAVENOUS | Status: AC
  Administered 2015-09-30: 1000 mL via INTRAVENOUS

## 2015-09-30 MED ORDER — FLUTICASONE PROPIONATE HFA 44 MCG/ACT IN AERO
2.0000 | INHALATION_SPRAY | Freq: Two times a day (BID) | RESPIRATORY_TRACT | Status: DC
  Administered 2015-10-01: 2 via RESPIRATORY_TRACT
  Filled 2015-09-30: qty 10.6

## 2015-09-30 MED ORDER — MOMETASONE FUROATE 220 MCG/INH IN AEPB: 2.0000 | INHALATION_SPRAY | Freq: Every day | RESPIRATORY_TRACT | Status: DC

## 2015-09-30 MED ORDER — WARFARIN - PHARMACIST DOSING INPATIENT: Freq: Every day | Status: DC

## 2015-09-30 MED ORDER — HYDROXYZINE HCL 25 MG PO TABS
25.0000 mg | ORAL_TABLET | Freq: Every day | ORAL | Status: DC | PRN
  Administered 2015-10-01 (×3): 25 mg via ORAL
  Filled 2015-09-30 (×3): qty 1

## 2015-09-30 MED ORDER — FUROSEMIDE 40 MG PO TABS
20.0000 mg | ORAL_TABLET | Freq: Every day | ORAL | Status: DC
  Administered 2015-10-01 – 2015-10-02 (×2): 20 mg via ORAL
  Filled 2015-09-30 (×2): qty 1

## 2015-09-30 MED ORDER — LISINOPRIL 10 MG PO TABS
10.0000 mg | ORAL_TABLET | Freq: Every day | ORAL | Status: DC
  Administered 2015-10-01 – 2015-10-02 (×2): 10 mg via ORAL
  Filled 2015-09-30 (×2): qty 1

## 2015-09-30 MED ORDER — BUSPIRONE HCL 15 MG PO TABS
7.5000 mg | ORAL_TABLET | Freq: Two times a day (BID) | ORAL | Status: DC
  Administered 2015-10-01 – 2015-10-02 (×4): 7.5 mg via ORAL
  Filled 2015-09-30 (×5): qty 1

## 2015-09-30 MED ORDER — FLUOXETINE HCL 20 MG PO CAPS
40.0000 mg | ORAL_CAPSULE | Freq: Every day | ORAL | Status: DC
  Administered 2015-10-01 – 2015-10-02 (×2): 40 mg via ORAL
  Filled 2015-09-30 (×2): qty 2

## 2015-09-30 MED ORDER — ATORVASTATIN CALCIUM 10 MG PO TABS
10.0000 mg | ORAL_TABLET | Freq: Every day | ORAL | Status: DC
  Administered 2015-10-01 (×2): 10 mg via ORAL
  Filled 2015-09-30 (×3): qty 1

## 2015-09-30 NOTE — ED Notes (Addendum)
When asked if she wanted to hurt herself, pt responded, "I have a son and only want to be here for him, I don't know what I'm going to do if he's gone." Unclear where her son is at this time. After this pt declined to answer any more questions. Pt is in a room clearly visible from the nurses station.

## 2015-09-30 NOTE — ED Notes (Addendum)
Patient presents via [employer] under IVC. Reports taking approximately 40 5mg  Vicodin on 09/26/15, and 5 5mg  Vicodin today. Denies SI/HI, endorses AVH sometimes, "when I take too many pills, I lose touch with reality". C/o chronic bilateral knee, shoulder and arm pain. Alert & Oriented x3, confused about year.

## 2015-09-30 NOTE — ED Notes (Signed)
Pt state we (the staff) should go ahead and just kill her. Nurse have been info

## 2015-09-30 NOTE — ED Provider Notes (Signed)
CSN: [id number]     Arrival date & time 09/30/15  1514 History   First MD Initiated Contact with Patient 09/30/15 1542     Chief Complaint  Patient presents with  . IVC      (Consider location/radiation/quality/duration/timing/severity/associated sxs/prior Treatment) Patient is a 75 y.o. female presenting with mental health disorder.  Mental Health Problem Presenting symptoms: suicidal threats   Patient accompanied by:  Law enforcement Degree of incapacity (severity):  Mild Onset quality:  Unable to specify Timing:  Unable to specify Progression:  Unable to specify Context: not alcohol use, not drug abuse and not noncompliant   Associated symptoms: no abdominal pain, no chest pain and no headaches     Past Medical History  Diagnosis Date  . Depression   . Schizophrenia (HCC)   . Hyperlipidemia   . Hypertension   . Chronic pain     "over my whole body" (03/30/2013)  . Pulmonary embolism (HCC) 12/2009    Large central bilateral PE's   . DVT (deep venous thrombosis) (HCC)     per 01/17/10 d/c summary- "remote hx of dvt"?  . Iron deficiency anemia   . Glaucoma   . Hemorrhoids   . Asthma   . Anxiety   . GERD (gastroesophageal reflux disease)   . Psoriasis 04/29/2012    New onset evaluated by Dr Marylou Flesher, Dermatology, [facility] 6/13  Rx 0.1% Tacrolimus ointment  . Complication of anesthesia     "because I have sleep apnea" (03/30/2013)  . Heart murmur   . Chest pain, exertional   . Chronic bronchitis (HCC)   . Sleep apnea     "waiting on my CPAP" (03/30/2013)  . Pneumonia     "a few times" (03/30/2013)  . Exertional shortness of breath     "& sometimes when laying down" (03/30/2013)  . Daily headache     "last 2 months" (03/30/2013)  . Arthritis     "joints" (03/30/2013)  . Varicose veins of legs   . Psoriasis    Past Surgical History  Procedure Laterality Date  . Spinal fusion      2004  . Carpal tunnel release Bilateral   . Tubal ligation  1973  . Dilation and  curettage of uterus  1970's    "once" (03/30/2013)  . 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 Joint replacement    . Vein surgery  left leg   Family History  Problem Relation Age of Onset  . Diabetes Sister   . Colon cancer Brother 40  . Colon cancer Brother    Social History  Substance Use Topics  . Smoking status: Never Smoker   . Smokeless tobacco: Never Used  . Alcohol Use: No   OB History    No data available     Review of Systems  Constitutional: Negative for fever.  HENT: Negative for congestion and facial swelling.   Eyes: Negative for discharge and redness.  Respiratory: Negative for cough and shortness of breath.   Cardiovascular: Negative for chest pain.  Gastrointestinal: Negative for abdominal pain and abdominal distention.  Endocrine: Negative for polydipsia.  Genitourinary: Negative for dysuria.  Musculoskeletal: Negative for back pain.  Skin: Negative for wound.  Neurological: Negative for headaches.  All other systems reviewed and are negative.     Allergies  Review of patient's allergies indicates no known allergies.  Home Medications   Prior to Admission medications   Medication Sig Start Date End Date Taking? Authorizing Provider  acetaminophen (  TYLENOL) 500 MG tablet Take 1 tablet (500 mg total) by mouth every 6 (six) hours as needed. Patient taking differently: Take 500 mg by mouth every 6 (six) hours as needed for mild pain.  08/28/15   Cheri Fowler, PA-C  albuterol (PROVENTIL HFA;VENTOLIN HFA) 108 (90 BASE) MCG/ACT inhaler Inhale 2 puffs into the lungs every 6 (six) hours as needed for wheezing or shortness of breath (wheezing). 12/08/14   Tyrone Nine, MD  amLODipine (NORVASC) 5 MG tablet Take 5 mg by mouth daily.  09/16/15 09/15/16  Historical Provider, MD  atenolol (TENORMIN) 25 MG tablet Take 25 mg by mouth daily.    Historical Provider, MD  atorvastatin (LIPITOR) 10 MG tablet Take 10 mg by mouth at bedtime.    Historical Provider, MD   busPIRone (BUSPAR) 7.5 MG tablet Take 7.5 mg by mouth 2 (two) times daily.    Historical Provider, MD  cephALEXin (KEFLEX) 500 MG capsule Take 1 capsule (500 mg total) by mouth every 6 (six) hours. 09/26/15   Charm Rings, NP  cyclobenzaprine (FLEXERIL) 10 MG tablet Take 1 tablet (10 mg total) by mouth 2 (two) times daily as needed for muscle spasms. 08/20/15   Elpidio Anis, PA-C  esomeprazole (NEXIUM) 40 MG capsule Take 1 capsule (40 mg total) by mouth daily. 09/07/15   Roxy Horseman, PA-C  FLUoxetine (PROZAC) 40 MG capsule Take 40 mg by mouth daily.    Historical Provider, MD  fluticasone (FLOVENT HFA) 220 MCG/ACT inhaler Inhale 2 puffs into the lungs daily.  09/16/15   Historical Provider, MD  Fluticasone Propionate, Inhal, (FLOVENT DISKUS) 250 MCG/BLIST AEPB Inhale 2 puffs into the lungs every 4 (four) hours as needed (for shortness of breath and coughing).    Historical Provider, MD  furosemide (LASIX) 20 MG tablet Take 1 tablet (20 mg total) by mouth daily. 10/29/14   Tyrone Nine, MD  gabapentin (NEURONTIN) 300 MG capsule Take 1 capsule (300 mg total) by mouth 3 (three) times daily. 08/28/15   Cheri Fowler, PA-C  hydrOXYzine (ATARAX/VISTARIL) 10 MG tablet Take 1 tablet (10 mg total) by mouth every 6 (six) hours as needed for itching (itching). 05/11/15   John Molpus, MD  hydrOXYzine (VISTARIL) 25 MG capsule Take 25 mg by mouth daily as needed for anxiety.    Historical Provider, MD  lisinopril (PRINIVIL,ZESTRIL) 10 MG tablet Take 10 mg by mouth daily.  09/16/15   Historical Provider, MD  meclizine (ANTIVERT) 25 MG tablet Take 1 tablet (25 mg total) by mouth 3 (three) times daily as needed for dizziness. 02/01/15   Arby Barrette, MD  mometasone [facility]) 220 MCG/INH inhaler Inhale 2 puffs into the lungs daily.    Historical Provider, MD  potassium chloride SA (K-DUR,KLOR-CON) 20 MEQ tablet Take 20 mEq by mouth daily.    Historical Provider, MD  silver sulfADIAZINE (SILVADENE) 1 % cream APPLY  TOPICALLY EVERY DAY 12/10/14   Tyrone Nine, MD  traMADol (ULTRAM) 50 MG tablet Take 50 mg by mouth 3 (three) times daily.  09/16/15 10/16/15  Historical Provider, MD  traZODone (DESYREL) 100 MG tablet Take 100 mg by mouth at bedtime.    Historical Provider, MD  triamcinolone cream (KENALOG) 0.1 % Apply 1 application topically 2 (two) times daily as needed (for legs). 10/29/14   Tyrone Nine, MD  warfarin (COUMADIN) 5 MG tablet TAKE 1 TABLET BY MOUTH EVERY DAY Patient taking differently: TAKE 1.5  TABLETS (7.5 mg)  BY MOUTH EVERY DAY 10/18/14  Levert FeinsteinJames M Granfortuna, MD   Pulse 112  Temp(Src) 98.7 F (37.1 C) (Oral)  Resp 15  SpO2 95%  LMP 11/22/1996 Physical Exam  Constitutional: She is oriented to person, place, and time. She appears well-developed and well-nourished.  HENT:  Head: Normocephalic and atraumatic.  Neck: Normal range of motion.  Cardiovascular: Regular rhythm.  Tachycardia present.   Pulmonary/Chest: Effort normal. No stridor. No respiratory distress.  Abdominal: Soft. She exhibits no distension. There is no tenderness.  Musculoskeletal: Normal range of motion. She exhibits no edema or tenderness.  Neurological: She is alert and oriented to person, place, and time.  Skin: Skin is warm and dry. No erythema.  Psychiatric: Her speech is tangential. Her speech is not rapid and/or pressured, not delayed and not slurred. She expresses no homicidal and no suicidal ideation. She expresses no suicidal plans and no homicidal plans. She is communicative.  Nursing note and vitals reviewed.   ED Course  Procedures (including critical care time) Labs Review Labs Reviewed  COMPREHENSIVE METABOLIC PANEL - Abnormal; Notable for the following:    ALT 13 (*)    GFR calc non Af Amer 58 (*)    All other components within normal limits  CBC WITH DIFFERENTIAL/PLATELET - Abnormal; Notable for the following:    RBC 3.74 (*)    Hemoglobin 11.5 (*)    HCT 35.1 (*)    All other components within  normal limits  URINE RAPID DRUG SCREEN, HOSP PERFORMED - Abnormal; Notable for the following:    Opiates POSITIVE (*)    All other components within normal limits  URINALYSIS, ROUTINE W REFLEX MICROSCOPIC (NOT AT [facility]) - Abnormal; Notable for the following:    Hgb urine dipstick TRACE (*)    Ketones, ur 15 (*)    Leukocytes, UA SMALL (*)    All other components within normal limits  URINE MICROSCOPIC-ADD ON - Abnormal; Notable for the following:    Squamous Epithelial / LPF 6-30 (*)    Bacteria, UA FEW (*)    All other components within normal limits  PROTIME-INR - Abnormal; Notable for the following:    Prothrombin Time 21.3 (*)    INR 1.85 (*)    All other components within normal limits  ETHANOL  PROTIME-INR    Imaging Review Dg Chest 2 View  10/01/2015  CLINICAL DATA:  Involuntary committed. Medical clearance. History of hypertension, hyperlipidemia, pulmonary embolism. EXAM: CHEST  2 VIEW COMPARISON:  Chest radiograph September 21, 2015 and CT chest April 23, 2014 FINDINGS: Cardiac silhouette is mildly enlarged and unchanged. Unchanged fullness of the RIGHT mediastinum, corresponding to known vascular structures. No pleural effusion or focal consolidation. No pneumothorax. Mild degenerative change of the thoracic spine. Soft tissue planes are nonsuspicious. IMPRESSION: Mild cardiomegaly.  No acute pulmonary process. Electronically Signed   By: Awilda Metroourtnay  Bloomer M.D.   On: 10/01/2015 01:05   I have personally reviewed and evaluated these images and lab results as part of my medical decision-making.   EKG Interpretation   Date/Time:  Friday September 30 2015 21:42:52 EST Ventricular Rate:  151 PR Interval:  163 QRS Duration: 95 QT Interval:  299 QTC Calculation: 474 R Axis:   41 Text Interpretation:  Atrial fibrillation with rapid V-rate Repolarization  abnormality, prob rate related ED PHYSICIAN INTERPRETATION AVAILABLE IN  [facility] Confirmed by TEST, Record ([id number]) on  10/01/2015 9:46:57 AM      MDM   Final diagnoses:  None   75 yo F IVC'ed 2/2  multiple suicidal references in a judge's office. Denying now but has tangential speaking, is redirectable but keeps referencing traumatic episodes of her past. Slightly gtachycardic, no e/o infection, will fluid hydrate and if HR improves, medically clear for TTS consultation.  Medically cleared, TTS will attempt placement.  Had an episode of afib with rvr. On review of charts, is supposed to be on atenolol. She states she has not had her medications in quite a few days suspect is likely the cause. One small IV dose of metoprolol was given with resolution of symptoms. Patient stable and still medically cleared for psychiatric placement. Home medications ordered to include atenolol.    Marily Memos, MD 10/01/15 [phone]

## 2015-09-30 NOTE — ED Notes (Signed)
Staffing notified for sitter need 
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
## 2015-09-30 NOTE — ED Notes (Signed)
EKG given to EDP,Messner,MD., for review. 

## 2015-09-30 NOTE — ED Notes (Signed)
IVC paperwork states "The respondent has been diagnosed with schizophrenia. Recently she has been hallucinating auditory and visually. Currently she is in the mental health court director's office. During this time period she has made several suicidal references and she has stated that she did not want to be here. She advised Mr. Thayer OhmWilliam Ferguson she had taken 40-50 vicodin pills. As a result, she currently poses a danger to herself.

## 2015-09-30 NOTE — ED Notes (Signed)
TTS speaking with patient. 

## 2015-09-30 NOTE — ED Notes (Signed)
Bed: [id number] Expected date:  Expected time:  Means of arrival:  Comments: IVC

## 2015-10-01 ENCOUNTER — Emergency Department (HOSPITAL_COMMUNITY): Payer: Medicare Other

## 2015-10-01 DIAGNOSIS — F4321 Adjustment disorder with depressed mood: Secondary | ICD-10-CM | POA: Diagnosis not present

## 2015-10-01 LAB — PROTIME-INR
INR: 1.85 — AB (ref 0.00–1.49)
Prothrombin Time: 21.3 seconds — ABNORMAL HIGH (ref 11.6–15.2)

## 2015-10-01 NOTE — ED Notes (Signed)
Pt has coat, shoes, shirts & dress, socks, & purse in locker # 30.  Hat and glasses are on pt.
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
## 2015-10-01 NOTE — ED Notes (Signed)
As I write this, she is fielding a phone call.  She is calm and talks with her sitter.

## 2015-10-01 NOTE — ED Notes (Addendum)
Pt appears blunted and depressed this am. She stated ,"I think I just took to much medication. Pt remains a 1:1with a Comptrollersitter. She stated ,'I thought maybe I would go back home but I do not know now." "I did want to go to a nursing home."Pt is very pleasant and cooperative. She did take a shower. Pt stated, "[place] and live with [place] daughter."[facility]

## 2015-10-01 NOTE — Progress Notes (Signed)
ANTICOAGULATION CONSULT NOTE - Initial Consult  Pharmacy Consult for warfarin Indication: DVT  No Known Allergies  Patient Measurements:   Heparin Dosing Weight:   Vital Signs: Temp: 98.8 F (37.1 C) (01/07 0531) Temp Source: Oral (01/07 0531) BP: 125/57 mmHg (01/07 0531) Pulse Rate: 72 (01/07 0531)  Labs:  Recent Labs  09/30/15 1857 10/01/15 0002  HGB 11.5*  --   HCT 35.1*  --   PLT 311  --   LABPROT  --  21.3*  INR  --  1.85*  CREATININE 0.96  --     Estimated Creatinine Clearance: 65.1 mL/min (by C-G formula based on Cr of 0.96).   Medical History: Past Medical History  Diagnosis Date  . Depression   . Schizophrenia ([facility])   . Hyperlipidemia   . Hypertension   . Chronic pain     "over my whole body" (03/30/2013)  . Pulmonary embolism ([facility]) 12/2009    Large central bilateral PE's   . DVT (deep venous thrombosis) ([facility])     per 01/17/10 d/c summary- "remote hx of dvt"?  . Iron deficiency anemia   . Glaucoma   . Hemorrhoids   . Asthma   . Anxiety   . GERD (gastroesophageal reflux disease)   . Psoriasis 04/29/2012    New onset evaluated by Dr Marylou FlesherWilliam Huang, [facility]at[facility]  Rx 0.1% Tacrolimus ointment  . Complication of anesthesia     "because I have sleep apnea" (03/30/2013)  . Heart murmur   . Chest pain, exertional   . Chronic bronchitis (HCC)   . Sleep apnea     "waiting on my CPAP" (03/30/2013)  . Pneumonia     "a few times" (03/30/2013)  . Exertional shortness of breath     "& sometimes when laying down" (03/30/2013)  . Daily headache     "last 2 months" (03/30/2013)  . Arthritis     "joints" (03/30/2013)  . Varicose veins of legs   . Psoriasis     Medications:  Scheduled:  . amLODipine  5 mg Oral Daily  . atenolol  25 mg Oral Daily  . atorvastatin  10 mg Oral QHS  . busPIRone  7.5 mg Oral BID  . FLUoxetine  40 mg Oral Daily  . fluticasone  2 puff Inhalation BID  . furosemide  20 mg Oral Daily  . gabapentin  300 mg Oral TID  .  lisinopril  10 mg Oral Daily  . pantoprazole  40 mg Oral Daily  . potassium chloride SA  20 mEq Oral Daily  . traZODone  100 mg Oral QHS  . warfarin  5 mg Oral q1800  . Warfarin - Pharmacist Dosing Inpatient   Does not apply q1800    Assessment: Patient on chronic warfarin for hx of DVT.  INR < 2 on admit.  MD wants pharmacy to continue warfarin.  Goal of Therapy:  INR 2-3    Plan:  Warfarin 5mg  po daily  Daily INR  Darlina GuysGrimsley Jr, Jacquenette ShoneJulian Crowford 10/01/2015,6:10 AM

## 2015-10-01 NOTE — ED Notes (Signed)
She is on a hospital bed with the skin care setting on and maintained.

## 2015-10-01 NOTE — ED Notes (Signed)
As I write this, she is speaking with her [facility]  She ambulates at will without difficulty.

## 2015-10-02 LAB — PROTIME-INR
INR: 1.59 — AB (ref 0.00–1.49)
Prothrombin Time: 19 seconds — ABNORMAL HIGH (ref 11.6–15.2)

## 2015-10-02 MED ORDER — GABAPENTIN 300 MG PO CAPS: 300.0000 mg | ORAL_CAPSULE | Freq: Three times a day (TID) | ORAL | Status: DC

## 2015-10-02 MED ORDER — FLUOXETINE HCL 40 MG PO CAPS: 40.0000 mg | ORAL_CAPSULE | Freq: Every day | ORAL | Status: DC

## 2015-10-02 MED ORDER — HYDROXYZINE PAMOATE 25 MG PO CAPS: 25.0000 mg | ORAL_CAPSULE | Freq: Every day | ORAL | Status: DC | PRN

## 2015-10-02 MED ORDER — TRAZODONE HCL 100 MG PO TABS: 100.0000 mg | ORAL_TABLET | Freq: Every day | ORAL | Status: DC

## 2015-10-02 MED ORDER — BUSPIRONE HCL 7.5 MG PO TABS: 7.5000 mg | ORAL_TABLET | Freq: Two times a day (BID) | ORAL | Status: DC

## 2015-10-02 MED ORDER — WARFARIN SODIUM 7.5 MG PO TABS
7.5000 mg | ORAL_TABLET | Freq: Once | ORAL | Status: AC
  Administered 2015-10-02: 7.5 mg via ORAL
  Filled 2015-10-02: qty 1

## 2015-10-02 MED ORDER — WARFARIN SODIUM 7.5 MG PO TABS
7.5000 mg | ORAL_TABLET | Freq: Once | ORAL | Status: DC
  Filled 2015-10-02: qty 1

## 2015-10-02 NOTE — Discharge Instructions (Signed)
  Adjustment Disorder Adjustment disorder is an unusually severe reaction to a stressful life event, such as the loss of a job or physical illness. The event may be any stressful event other than the loss of a loved one. Adjustment disorder may affect your feelings, your thinking, how you act, or a combination of these. It may interfere with personal relationships or with the way you are at work, school, or home. People with this disorder are at risk for suicide and substance abuse. They may develop a more serious mental disorder, such as major depressive disorder or post-traumatic stress disorder. SIGNS AND SYMPTOMS  Symptoms may include:  Sadness, depressed mood, or crying spells.  Loss of enjoyment.  Change in appetite or weight.  Sense of loss or hopelessness.  Thoughts of suicide.  Anxiety, worry, or nervousness.  Trouble sleeping.  Avoiding family and friends.  Poor school performance.  Fighting or vandalism.  Reckless driving.  Skipping school.  Poor work performance.  Ignoring bills. Symptoms of adjustment disorder start within 3 months of the stressful life event. They do not last more than 6 months after the event has ended. DIAGNOSIS  To make a diagnosis, your health care provider will ask about what has happened in your life and how it has affected you. He or she may also ask about your medical history and use of medicines, alcohol, and other substances. Your health care provider may do a physical exam and order lab tests or other studies. You may be referred to a mental health specialist for evaluation. TREATMENT  Treatment options include:  Counseling or talk therapy. Talk therapy is usually provided by mental health specialists.  Medicine. Certain medicines may help with depression, anxiety, and sleep.  Support groups. Support groups offer emotional support, advice, and guidance. They are made up of people who have had similar experiences. HOME CARE  INSTRUCTIONS  Keep all follow-up visits as directed by your health care provider. This is important.  Take medicines only as directed by your health care provider. SEEK MEDICAL CARE IF:  Your symptoms get worse.  SEEK IMMEDIATE MEDICAL CARE IF: You have serious thoughts about hurting yourself or someone else. MAKE SURE YOU:  Understand these instructions.  Will watch your condition.  Will get help right away if you are not doing well or get worse.   This information is not intended to replace advice given to you by your health care provider. Make sure you discuss any questions you have with your health care provider.   Document Released: 05/15/2006 Document Revised: 10/01/2014 Document Reviewed: 02/02/2014 Elsevier Interactive Patient Education 2016 [employer]  

## 2015-10-02 NOTE — Progress Notes (Signed)
This patient is not homebound and has been noted in [facility] MD notes to be cleaning her ED room and making her bed (has ability to do her own ADLs) per home health criteria therefore is not a home health candidate. Pt is requesting assistance with placement to a facility not home health  [facility] not help with [facility] and placement Refer to SW

## 2015-10-02 NOTE — [facility] Suicide Risk Assessment (Signed)
Suicide Risk Assessment  Discharge Assessment   [facility] Discharge Suicide Risk Assessment   Demographic Factors:  Living alone  Total Time spent with patient: 30 minutes  Musculoskeletal: Strength & Muscle Tone: within normal limits Gait & Station: normal Patient leans: N/A  Psychiatric Specialty Exam: Review of Systems  Constitutional: Negative.   HENT: Negative.   Eyes: Negative.   Respiratory: Negative.   Cardiovascular: Negative.   Gastrointestinal: Negative.   Genitourinary: Negative.   Musculoskeletal: Negative.   Skin: Negative.   Neurological: Negative.   Endo/Heme/Allergies: Negative.   Psychiatric/Behavioral:       Negative    Blood pressure 115/60, pulse 85, temperature 98.7 F (37.1 C), temperature source Oral, resp. rate 20, last menstrual period 11/22/1996, SpO2 93 %.There is no weight on file to calculate BMI.  General Appearance: Casual  Eye Contact::  Good  Speech:  Normal Rate  Volume:  Normal  Mood:  Euthymic  Affect:  Congruent  Thought Process:  Coherent  Orientation:  Full (Time, Place, and Person)  Thought Content:  WDL  Suicidal Thoughts:  No  Homicidal Thoughts:  No  Memory:  Immediate;   Good Recent;   Good Remote;   Good  Judgement:  Fair  Insight:  Fair  Psychomotor Activity:  Normal  Concentration:  Good  Recall:  Good  Fund of Knowledge:Good  Language: Good  Akathisia:  No  Handed:  Right  AIMS (if indicated):     Assets:  Leisure Time Physical Health Resilience Social Support  ADL's:  Intact  Cognition: WNL  Sleep:         Has this patient used any form of tobacco in the last 30 days? (Cigarettes, Smokeless Tobacco, Cigars, and/or Pipes) No  Mental Status Per Nursing Assessment::   On Admission:   suicidal ideations  Current Mental Status by Physician: NA  Loss Factors: NA  Historical Factors: NA  Risk Reduction Factors:   Sense of responsibility to family, Positive social support and Positive therapeutic  relationship  Continued Clinical Symptoms:  None  Cognitive Features That Contribute To Risk:  None    Suicide Risk:  Minimal: No identifiable suicidal ideation.  Patients presenting with no risk factors but with morbid ruminations; may be classified as minimal risk based on the severity of the depressive symptoms  Principal Problem: Adjustment disorder with depressed mood Discharge Diagnoses:  Patient Active Problem List   Diagnosis Date Noted  . Adjustment disorder with depressed mood [F43.21] 10/01/2015    Priority: High  . Major depressive disorder, recurrent episode, mild (HCC) [F33.0] 09/26/2015    Priority: High  . Encounter for preadmission testing [Z01.818]   . GAD (generalized anxiety disorder) [F41.1] 09/09/2015  . Major depressive disorder, recurrent, severe without psychotic features (HCC) [F33.2] 04/26/2015  . Osteoarthritis of left knee [M17.9] 10/29/2014  . Back pain [M54.9] 06/09/2013  . Vertigo [R42] 05/31/2012  . Psoriasis [L40.9] 04/29/2012  . Chronic anticoagulation [Z79.01] 12/11/2011  . Heterozygous factor V Leiden mutation (HCC) [D68.51] 01/18/2011  . Anemia, normocytic normochromic [D64.9] 01/18/2011  . GERD [K21.9] 12/21/2010  . Family history of malignant neoplasm of gastrointestinal tract [Z80.0] 12/21/2010  . Iron deficiency anemia, unspecified [D50.9] 12/21/2010  . Varicose veins of both lower extremities with pain [I83.813] 12/15/2010  . Long term current use of anticoagulant therapy [Z79.01] 11/30/2010  . History of pulmonary embolism [Z86.711] 01/30/2010  . Dyslipidemia [E78.5] 01/10/2010  . Asthma [J45.909] 01/10/2010  . Obesity [E66.9] 09/26/2009  . Major depressive disorder with psychotic  features (HCC) [F32.3] 09/26/2009  . Chronic pain syndrome [G89.4] 09/26/2009  . Essential hypertension, benign [I10] 09/26/2009      Plan Of Care/Follow-up recommendations:  Activity:  as tolerated Diet:  heart healthy diet  Is patient on multiple  antipsychotic therapies at discharge:  No   Has Patient had three or more failed trials of antipsychotic monotherapy by history:  No  Recommended Plan for Multiple Antipsychotic Therapies: NA    Ryliee Figge, PMH-NP 10/02/2015, 11:24 AM
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
## 2015-10-02 NOTE — ED Notes (Signed)
Dr. Lyndee LeoA and [place], [facility], informed of patient's SI statements earlier today.

## 2015-10-02 NOTE — ED Notes (Signed)
Patient upset about the [facility] to her.  Objecting to going home.  Patient pacing in hall, stating, "I'm going to teach her some manners." talking about [facility].

## 2015-10-02 NOTE — Progress Notes (Signed)
ED CM spoke with EDP [facility]  Radford PaxBeaton and TTS Olivia Mackieressa to update patient needs referral to social work

## 2015-10-02 NOTE — ED Notes (Addendum)
Pt stated,"I live for my son. " Pt stated ,."I will go see my attorney tomorrow to see if he can help me get into an assisted living facility. Per NP pt is discharged. Np brought out pts discharge instructions and stated they were working on getting her a cab voucher. Per TTS pt does not meet criteria for a case manager consult. Spoke with charge nurse, Clayborne Danaatti to make her aware. Pt was on the phone with her sister but refused to go be with her sister on [address]. NP made aware there is an order for a case manager consult. Per NP pt had seen a case manager on her last visit. 12:10; Case worker, Crystal called and stated the pt would need to see Child psychotherapistsocial worker. Case worker will contact social worker now to stop to see the pt. Per case management she deals with acute care placement and medications.  not referrals for assisted living./ An order was placed for social work to see the pt. (12:20pm)Phoned pharmacy to send down pts home medications. Social worker, [place] ,in with the pt. (12:45pm) Pt presently is lying in the bed talking to the social worker. Per psychiatry pt is discharged. 1:10pm Pt is still conversing with the Child psychotherapistsocial worker. 1;10p Pt is for an EKG. 1:20pm Per Child psychotherapistsocial worker the providers do not want an EKG now. Pt was given back all her RX and her belongings. Pt is for discharge. Pt was given her p coumadin dose per pharmacy( 2pm)2;15p Pt appeared in very good spirits when leaving. She was placed in a wheelchair and [employer] will make sure she gets into the cab that was ordered. Pt left with all her RX and discharge instructions. She stated," I will go see Quamy on Tuesday."

## 2015-10-02 NOTE — Clinical Social Work Note (Signed)
Clinical Social Work Assessment  Patient Details  Name: [name] [name] MRN: [id number] Date of Birth: 28-Feb-1941  Date of referral:  10/02/15               Reason for consult:  Facility Placement                Permission sought to share information with:    Permission granted to share information::  No  Name::        Agency::     Relationship::     Contact Information:     Housing/Transportation Living arrangements for the past 2 months:  Single Family Home Source of Information:  Patient Patient Interpreter Needed:    Criminal Activity/Legal Involvement Pertinent to Current Situation/Hospitalization:    Significant Relationships:  Adult Children, Spouse Lives with:  Self Do you feel safe going back to the place where you live?  Yes Need for family participation in patient care:  No (Coment)  Care giving concerns:  No caregiver   Facilities manager / plan:  CSW met with pt at bedside to discuss assisted living facilities.  CSW prompted pt to discuss her history and needs and explained both skilled and assisted living facilities in the community.  CSW provided a list of assisted living facilities to pt.  CSW explained insurance and what pt's insurance would pay and not pay.  CSW provided supported listening and encouraged pt to explore her thoughts and feelings related to her physical and mental health.  CSW also provided a taxi voucher to RN for pt discharge home.  Employment status:    Insurance information:  Managed Care PT Recommendations:  Not assessed at this time Information / Referral to community resources:     Patient/Family's Response to care:  Pt discussed living alone.  She reported that she had just placed her son in a group home which was her main concern and that now she wanted to look into some assisted living or alternative living.  Pt stated that she has an attorney that she will bring her list of assisted living facilities to and that he would help her  decide which one to pick.  Pt stated that she also has two daughters, one here in [place] and one in [place] that are supportive of her.  Pt has three brothers in [place] that are also supportive of her.  Pt stated that she had applied for medicaid because now that she does not have her son's income she should qualify for the medicaid.    Patient/Family's Understanding of and Emotional Response to Diagnosis, Current Treatment, and Prognosis:  Pt understands that once she gets her medicaid this could help pay for an assisted living facility.  Pt understands that she gets "overwhelmed" and then winds up back at hospital so she was going to work on her thoughts.  Pt hopeful that her attorney will help get her into some assisted living so that she can do activites with others.  She is able to take care of her ADL's at this time.  Emotional Assessment Appearance:  Appears stated age Attitude/Demeanor/Rapport:   (cooperative) Affect (typically observed):  Accepting Orientation:  Oriented to Place, Oriented to Self, Oriented to  Time, Oriented to Situation Alcohol / Substance use:    Psych involvement (Current and /or in the community):  Yes (Comment)  Discharge Needs  Concerns to be addressed:    Readmission within the last 30 days:  Yes Current discharge risk:  Barriers to Discharge:  No Barriers Identified   Carlean Jews, LCSW 10/02/2015, 1:35 PM

## 2015-10-02 NOTE — Care Management (Signed)
CM received a call from ED requesting assistance with long-term placement. CM explained CSW will need to be consulted for placement. Notified Weekend CSW who will see patient. Physicist, medicalCrystal Franki Stemen RN BSN CCM

## 2015-10-02 NOTE — Progress Notes (Signed)
ANTICOAGULATION CONSULT NOTE - Initial Consult  Pharmacy Consult for warfarin Indication: Hx of PE/DVT  No Known Allergies  Patient Measurements:   Total body weight: 108.9 kg  Vital Signs: Temp: 98.3 F (36.8 C) (01/08 0559) Temp Source: Oral (01/08 0559) BP: 104/64 mmHg (01/08 0559) Pulse Rate: 84 (01/08 0559)  Labs:  Recent Labs  09/30/15 1857 10/01/15 0002 10/02/15 0827  HGB 11.5*  --   --   HCT 35.1*  --   --   PLT 311  --   --   LABPROT  --  21.3* 19.0*  INR  --  1.85* 1.59*  CREATININE 0.96  --   --    Estimated Creatinine Clearance: 65.1 mL/min (by C-G formula based on Cr of 0.96).  Medical History: Past Medical History  Diagnosis Date  . Depression   . Schizophrenia (HCC)   . Hyperlipidemia   . Hypertension   . Chronic pain     "over my whole body" (03/30/2013)  . Pulmonary embolism (HCC) 12/2009    Large central bilateral PE's   . DVT (deep venous thrombosis) (HCC)     per 01/17/10 d/c summary- "remote hx of dvt"?  . Iron deficiency anemia   . Glaucoma   . Hemorrhoids   . Asthma   . Anxiety   . GERD (gastroesophageal reflux disease)   . Psoriasis 04/29/2012    New onset evaluated by Dr Marylou FlesherWilliam Huang, [facility]  Rx 0.1% Tacrolimus ointment  . Complication of anesthesia     "because I have sleep apnea" (03/30/2013)  . Heart murmur   . Chest pain, exertional   . Chronic bronchitis (HCC)   . Sleep apnea     "waiting on my CPAP" (03/30/2013)  . Pneumonia     "a few times" (03/30/2013)  . Exertional shortness of breath     "& sometimes when laying down" (03/30/2013)  . Daily headache     "last 2 months" (03/30/2013)  . Arthritis     "joints" (03/30/2013)  . Varicose veins of legs   . Psoriasis    Medications:  Scheduled:  . amLODipine  5 mg Oral Daily  . atenolol  25 mg Oral Daily  . atorvastatin  10 mg Oral QHS  . busPIRone  7.5 mg Oral BID  . FLUoxetine  40 mg Oral Daily  . fluticasone  2 puff Inhalation BID  . furosemide  20 mg  Oral Daily  . gabapentin  300 mg Oral TID  . lisinopril  10 mg Oral Daily  . pantoprazole  40 mg Oral Daily  . potassium chloride SA  20 mEq Oral Daily  . traZODone  100 mg Oral QHS  . warfarin  5 mg Oral q1800  . Warfarin - Pharmacist Dosing Inpatient   Does not apply q1800   Assessment: To ED 1/6 from mental health court director's office, stating SI and had taken 40-50 Vicodin tablets.  Last admit [facility] 12/27- was discharged home with home health instead of facility, INR was subtherapeutic that admit. Previous admit [facility].  Today 10/02/2015 INR decreased despite Warfarin 5mg  x2 yesterday, doubt we have seen total response Will discontinue daily Warfarin order  Goal of Therapy:  INR 2-3    Plan:  Warfarin 7.5mg  today at 1800 Daily INR CBC in am  Otho BellowsGreen, Marykathleen Russi L PharmD Pager [phone] 10/02/2015, 10:07 AM

## 2015-10-02 NOTE — ED Notes (Signed)
Patient informed she may be going home today.  Patient states she does not want to go home and live alone.  Patient states, "If y'all keep sending me out of here, I AM going to hurt myself."  Patient states she is depressed and does not like living alone.  Patient states if she goes home, "I'll just stay high," on Vicodin and Percocet.  Patient requesting social work consult to help her arrange another living situation.

## 2015-10-09 ENCOUNTER — Encounter (HOSPITAL_COMMUNITY): Payer: Self-pay | Admitting: Emergency Medicine

## 2015-10-09 ENCOUNTER — Emergency Department (HOSPITAL_COMMUNITY)
Admission: EM | Admit: 2015-10-09 | Discharge: 2015-10-09 | Disposition: A | Discharge status: Home / Self Care | Payer: Medicare Other | Attending: Emergency Medicine | Admitting: Emergency Medicine

## 2015-10-09 DIAGNOSIS — Z8701 Personal history of pneumonia (recurrent): Secondary | ICD-10-CM | POA: Diagnosis not present

## 2015-10-09 DIAGNOSIS — Z7951 Long term (current) use of inhaled steroids: Secondary | ICD-10-CM | POA: Diagnosis not present

## 2015-10-09 DIAGNOSIS — G8929 Other chronic pain: Secondary | ICD-10-CM | POA: Insufficient documentation

## 2015-10-09 DIAGNOSIS — Z872 Personal history of diseases of the skin and subcutaneous tissue: Secondary | ICD-10-CM | POA: Diagnosis not present

## 2015-10-09 DIAGNOSIS — R45851 Suicidal ideations: Secondary | ICD-10-CM | POA: Diagnosis present

## 2015-10-09 DIAGNOSIS — E785 Hyperlipidemia, unspecified: Secondary | ICD-10-CM | POA: Insufficient documentation

## 2015-10-09 DIAGNOSIS — G4730 Sleep apnea, unspecified: Secondary | ICD-10-CM | POA: Insufficient documentation

## 2015-10-09 DIAGNOSIS — K219 Gastro-esophageal reflux disease without esophagitis: Secondary | ICD-10-CM | POA: Diagnosis not present

## 2015-10-09 DIAGNOSIS — Z86711 Personal history of pulmonary embolism: Secondary | ICD-10-CM | POA: Insufficient documentation

## 2015-10-09 DIAGNOSIS — Z7901 Long term (current) use of anticoagulants: Secondary | ICD-10-CM | POA: Diagnosis not present

## 2015-10-09 DIAGNOSIS — R011 Cardiac murmur, unspecified: Secondary | ICD-10-CM | POA: Diagnosis not present

## 2015-10-09 DIAGNOSIS — F209 Schizophrenia, unspecified: Secondary | ICD-10-CM | POA: Diagnosis not present

## 2015-10-09 DIAGNOSIS — Z86718 Personal history of other venous thrombosis and embolism: Secondary | ICD-10-CM | POA: Insufficient documentation

## 2015-10-09 DIAGNOSIS — Z79899 Other long term (current) drug therapy: Secondary | ICD-10-CM | POA: Diagnosis not present

## 2015-10-09 DIAGNOSIS — I10 Essential (primary) hypertension: Secondary | ICD-10-CM | POA: Diagnosis not present

## 2015-10-09 DIAGNOSIS — J45909 Unspecified asthma, uncomplicated: Secondary | ICD-10-CM | POA: Insufficient documentation

## 2015-10-09 DIAGNOSIS — M1990 Unspecified osteoarthritis, unspecified site: Secondary | ICD-10-CM | POA: Insufficient documentation

## 2015-10-09 DIAGNOSIS — Z862 Personal history of diseases of the blood and blood-forming organs and certain disorders involving the immune mechanism: Secondary | ICD-10-CM | POA: Diagnosis not present

## 2015-10-09 DIAGNOSIS — F4321 Adjustment disorder with depressed mood: Secondary | ICD-10-CM | POA: Diagnosis not present
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
## 2015-10-09 NOTE — Progress Notes (Signed)
Per Catha NottinghamJamison, NP the TTS consult is canceled.  No further TTS assistance is needed   Maryelizabeth Rowanressa Osei Anger, MSW, Clare CharonLCSW, LCAS [facility] Triage Specialist [phone]
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
## 2015-10-09 NOTE — Discharge Instructions (Signed)
  Adjustment Disorder Adjustment disorder is an unusually severe reaction to a stressful life event, such as the loss of a job or physical illness. The event may be any stressful event other than the loss of a loved one. Adjustment disorder may affect your feelings, your thinking, how you act, or a combination of these. It may interfere with personal relationships or with the way you are at work, school, or home. People with this disorder are at risk for suicide and substance abuse. They may develop a more serious mental disorder, such as major depressive disorder or post-traumatic stress disorder. SIGNS AND SYMPTOMS  Symptoms may include:  Sadness, depressed mood, or crying spells.  Loss of enjoyment.  Change in appetite or weight.  Sense of loss or hopelessness.  Thoughts of suicide.  Anxiety, worry, or nervousness.  Trouble sleeping.  Avoiding family and friends.  Poor school performance.  Fighting or vandalism.  Reckless driving.  Skipping school.  Poor work performance.  Ignoring bills. Symptoms of adjustment disorder start within 3 months of the stressful life event. They do not last more than 6 months after the event has ended. DIAGNOSIS  To make a diagnosis, your health care provider will ask about what has happened in your life and how it has affected you. He or she may also ask about your medical history and use of medicines, alcohol, and other substances. Your health care provider may do a physical exam and order lab tests or other studies. You may be referred to a mental health specialist for evaluation. TREATMENT  Treatment options include:  Counseling or talk therapy. Talk therapy is usually provided by mental health specialists.  Medicine. Certain medicines may help with depression, anxiety, and sleep.  Support groups. Support groups offer emotional support, advice, and guidance. They are made up of people who have had similar experiences. HOME CARE  INSTRUCTIONS  Keep all follow-up visits as directed by your health care provider. This is important.  Take medicines only as directed by your health care provider. SEEK MEDICAL CARE IF:  Your symptoms get worse.  SEEK IMMEDIATE MEDICAL CARE IF: You have serious thoughts about hurting yourself or someone else. MAKE SURE YOU:  Understand these instructions.  Will watch your condition.  Will get help right away if you are not doing well or get worse.   This information is not intended to replace advice given to you by your health care provider. Make sure you discuss any questions you have with your health care provider.   Document Released: 05/15/2006 Document Revised: 10/01/2014 Document Reviewed: 02/02/2014 Elsevier Interactive Patient Education 2016 [employer]  

## 2015-10-09 NOTE — Clinical Social Work Note (Signed)
CSW met with pt to provide discharge needs.  CSW obtained phone numbers from pt and placed calls to her mental health court liaison Will [facility] [phone] and Probation office K Ritter [phone]. CSW left detailed information with permission from pt requesting some additional needs and or services that they may help her with when she attends their session tomorrow.  The CSW phone number was also left so that they may call with any questions and or follow up.  CSW also provided pt with a taxi voucher and some bus passes to help her with her transportation to her court sessions.  Dede Query, LCSW [facility] Work[facility] - Weekend Coverage cell #: [phone]

## 2015-10-09 NOTE — BHH Suicide Risk Assessment (Signed)
Suicide Risk Assessment  Discharge Assessment   [facility] Discharge Suicide Risk Assessment   Demographic Factors:  Age 75 or older and Living alone  Total Time spent with patient: 45 minutes  Musculoskeletal: Strength & Muscle Tone: within normal limits Gait & Station: normal Patient leans: N/A  Psychiatric Specialty Exam: Review of Systems  Constitutional: Negative.   HENT: Negative.   Eyes: Negative.   Respiratory: Negative.   Cardiovascular: Negative.   Gastrointestinal: Negative.   Genitourinary: Negative.   Musculoskeletal: Negative.   Skin: Negative.   Neurological: Negative.   Endo/Heme/Allergies: Negative.   Psychiatric/Behavioral: Positive for depression.    Blood pressure 175/88, pulse 76, temperature 98.4 F (36.9 C), temperature source Oral, resp. rate 16, last menstrual period 11/22/1996, SpO2 95 %.There is no weight on file to calculate BMI.  General Appearance: Casual  Eye Contact::  Good  Speech:  Normal Rate  Volume:  Normal  Mood:  Depression, mild  Affect:  Congruent  Thought Process:  Coherent  Orientation:  Full (Time, Place, and Person)  Thought Content:  WDL  Suicidal Thoughts:  No  Homicidal Thoughts:  No  Memory:  Immediate;   Good Recent;   Good Remote;   Good  Judgement:  Fair  Insight:  Fair  Psychomotor Activity:  Normal  Concentration:  Good  Recall:  Good  Fund of Knowledge:Good  Language: Good  Akathisia:  No  Handed:  Right  AIMS (if indicated):     Assets:  Leisure Time Physical Health Resilience Social Support  ADL's:  Intact  Cognition: WNL  Sleep:          Has this patient used any form of tobacco in the last 30 days? (Cigarettes, Smokeless Tobacco, Cigars, and/or Pipes) No  Mental Status Per Nursing Assessment::   On Admission:   Depression  Current Mental Status by Physician: NA  Loss Factors: NA  Historical Factors: NA  Risk Reduction Factors:   Sense of responsibility to family and Positive  therapeutic relationship  Continued Clinical Symptoms:  Depression, mild  Cognitive Features That Contribute To Risk:  None    Suicide Risk:  Minimal: No identifiable suicidal ideation.  Patients presenting with no risk factors but with morbid ruminations; may be classified as minimal risk based on the severity of the depressive symptoms  Principal Problem: Adjustment disorder with depressed mood Discharge Diagnoses:  Patient Active Problem List   Diagnosis Date Noted  . Adjustment disorder with depressed mood [F43.21] 10/01/2015    Priority: High  . Major depressive disorder, recurrent episode, mild (HCC) [F33.0] 09/26/2015    Priority: High  . Encounter for preadmission testing [Z01.818]   . GAD (generalized anxiety disorder) [F41.1] 09/09/2015  . Major depressive disorder, recurrent, severe without psychotic features (HCC) [F33.2] 04/26/2015  . Osteoarthritis of left knee [M17.9] 10/29/2014  . Back pain [M54.9] 06/09/2013  . Vertigo [R42] 05/31/2012  . Psoriasis [L40.9] 04/29/2012  . Chronic anticoagulation [Z79.01] 12/11/2011  . Heterozygous factor V Leiden mutation (HCC) [D68.51] 01/18/2011  . Anemia, normocytic normochromic [D64.9] 01/18/2011  . GERD [K21.9] 12/21/2010  . Family history of malignant neoplasm of gastrointestinal tract [Z80.0] 12/21/2010  . Iron deficiency anemia, unspecified [D50.9] 12/21/2010  . Varicose veins of both lower extremities with pain [I83.813] 12/15/2010  . Long term current use of anticoagulant therapy [Z79.01] 11/30/2010  . History of pulmonary embolism [Z86.711] 01/30/2010  . Dyslipidemia [E78.5] 01/10/2010  . Asthma [J45.909] 01/10/2010  . Obesity [E66.9] 09/26/2009  . Major depressive disorder with psychotic  features (HCC) [F32.3] 09/26/2009  . Chronic pain syndrome [G89.4] 09/26/2009  . Essential hypertension, benign [I10] 09/26/2009      Plan Of Care/Follow-up recommendations:  Activity:  as tolerated Diet:  heart healthy  diet  Is patient on multiple antipsychotic therapies at discharge:  No   Has Patient had three or more failed trials of antipsychotic monotherapy by history:  No  Recommended Plan for Multiple Antipsychotic Therapies: NA    LORD, JAMISON, PMH-NP 10/09/2015, 12:55 PM

## 2015-10-09 NOTE — BH Assessment (Signed)
Writer informed TTS Olivia Mackie(Tressa) of the consult.
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
## 2015-10-12 ENCOUNTER — Encounter (HOSPITAL_COMMUNITY): Payer: Self-pay | Admitting: Emergency Medicine

## 2015-10-12 ENCOUNTER — Emergency Department (HOSPITAL_COMMUNITY)
Admission: EM | Admit: 2015-10-12 | Discharge: 2015-10-13 | Payer: Medicare Other | Attending: Emergency Medicine | Admitting: Emergency Medicine

## 2015-10-12 DIAGNOSIS — F329 Major depressive disorder, single episode, unspecified: Secondary | ICD-10-CM | POA: Diagnosis not present

## 2015-10-12 DIAGNOSIS — F4321 Adjustment disorder with depressed mood: Secondary | ICD-10-CM

## 2015-10-12 DIAGNOSIS — Z86718 Personal history of other venous thrombosis and embolism: Secondary | ICD-10-CM | POA: Insufficient documentation

## 2015-10-12 DIAGNOSIS — K219 Gastro-esophageal reflux disease without esophagitis: Secondary | ICD-10-CM | POA: Diagnosis not present

## 2015-10-12 DIAGNOSIS — Z86711 Personal history of pulmonary embolism: Secondary | ICD-10-CM | POA: Insufficient documentation

## 2015-10-12 DIAGNOSIS — I10 Essential (primary) hypertension: Secondary | ICD-10-CM | POA: Insufficient documentation

## 2015-10-12 DIAGNOSIS — G8929 Other chronic pain: Secondary | ICD-10-CM | POA: Insufficient documentation

## 2015-10-12 DIAGNOSIS — Z7901 Long term (current) use of anticoagulants: Secondary | ICD-10-CM | POA: Insufficient documentation

## 2015-10-12 DIAGNOSIS — Z872 Personal history of diseases of the skin and subcutaneous tissue: Secondary | ICD-10-CM | POA: Diagnosis not present

## 2015-10-12 DIAGNOSIS — E785 Hyperlipidemia, unspecified: Secondary | ICD-10-CM | POA: Diagnosis not present

## 2015-10-12 DIAGNOSIS — Z792 Long term (current) use of antibiotics: Secondary | ICD-10-CM | POA: Diagnosis not present

## 2015-10-12 DIAGNOSIS — G4730 Sleep apnea, unspecified: Secondary | ICD-10-CM | POA: Insufficient documentation

## 2015-10-12 DIAGNOSIS — Z79899 Other long term (current) drug therapy: Secondary | ICD-10-CM | POA: Diagnosis not present

## 2015-10-12 DIAGNOSIS — J45909 Unspecified asthma, uncomplicated: Secondary | ICD-10-CM | POA: Insufficient documentation

## 2015-10-12 DIAGNOSIS — Z862 Personal history of diseases of the blood and blood-forming organs and certain disorders involving the immune mechanism: Secondary | ICD-10-CM | POA: Diagnosis not present

## 2015-10-12 DIAGNOSIS — R45851 Suicidal ideations: Secondary | ICD-10-CM | POA: Diagnosis present

## 2015-10-12 DIAGNOSIS — M1990 Unspecified osteoarthritis, unspecified site: Secondary | ICD-10-CM | POA: Insufficient documentation

## 2015-10-12 DIAGNOSIS — R011 Cardiac murmur, unspecified: Secondary | ICD-10-CM | POA: Insufficient documentation

## 2015-10-12 DIAGNOSIS — Z7951 Long term (current) use of inhaled steroids: Secondary | ICD-10-CM | POA: Insufficient documentation

## 2015-10-12 DIAGNOSIS — Z9981 Dependence on supplemental oxygen: Secondary | ICD-10-CM | POA: Insufficient documentation

## 2015-10-12 LAB — CBC WITH DIFFERENTIAL/PLATELET
Basophils Absolute: 0 10*3/uL (ref 0.0–0.1)
Basophils Relative: 0 %
Eosinophils Absolute: 0.1 10*3/uL (ref 0.0–0.7)
Eosinophils Relative: 1 %
HCT: 35.8 % — ABNORMAL LOW (ref 36.0–46.0)
HEMOGLOBIN: 11.8 g/dL — AB (ref 12.0–15.0)
LYMPHS ABS: 1.1 10*3/uL (ref 0.7–4.0)
Lymphocytes Relative: 15 %
MCH: 30.3 pg (ref 26.0–34.0)
MCHC: 33 g/dL (ref 30.0–36.0)
MCV: 91.8 fL (ref 78.0–100.0)
MONOS PCT: 4 %
Monocytes Absolute: 0.3 10*3/uL (ref 0.1–1.0)
NEUTROS ABS: 6 10*3/uL (ref 1.7–7.7)
NEUTROS PCT: 80 %
Platelets: 275 10*3/uL (ref 150–400)
RBC: 3.9 MIL/uL (ref 3.87–5.11)
RDW: 15 % (ref 11.5–15.5)
WBC: 7.5 10*3/uL (ref 4.0–10.5)

## 2015-10-12 LAB — COMPREHENSIVE METABOLIC PANEL
ALBUMIN: 4 g/dL (ref 3.5–5.0)
ALK PHOS: 94 U/L (ref 38–126)
ALT: 15 U/L (ref 14–54)
ANION GAP: 10 (ref 5–15)
AST: 28 U/L (ref 15–41)
BILIRUBIN TOTAL: 0.6 mg/dL (ref 0.3–1.2)
BUN: 8 mg/dL (ref 6–20)
CALCIUM: 9.5 mg/dL (ref 8.9–10.3)
CO2: 28 mmol/L (ref 22–32)
Chloride: 100 mmol/L — ABNORMAL LOW (ref 101–111)
Creatinine, Ser: 0.9 mg/dL (ref 0.44–1.00)
GFR calc non Af Amer: >60 mL/min (ref >60)
GLUCOSE: 153 mg/dL — AB (ref 65–99)
POTASSIUM: 3.1 mmol/L — AB (ref 3.5–5.1)
SODIUM: 138 mmol/L (ref 135–145)
TOTAL PROTEIN: 8.5 g/dL — AB (ref 6.5–8.1)

## 2015-10-12 LAB — PROTIME-INR
INR: 1.44 (ref 0.00–1.49)
Prothrombin Time: 17.6 seconds — ABNORMAL HIGH (ref 11.6–15.2)

## 2015-10-12 LAB — URINALYSIS, ROUTINE W REFLEX MICROSCOPIC
Bilirubin Urine: NEGATIVE
Glucose, UA: NEGATIVE mg/dL
Hgb urine dipstick: NEGATIVE
KETONES UR: NEGATIVE mg/dL
NITRITE: NEGATIVE
PROTEIN: 30 mg/dL — AB
Specific Gravity, Urine: 1.023 (ref 1.005–1.030)
pH: 6.5 (ref 5.0–8.0)

## 2015-10-12 LAB — RAPID URINE DRUG SCREEN, HOSP PERFORMED
AMPHETAMINES: NOT DETECTED
BENZODIAZEPINES: NOT DETECTED
Barbiturates: NOT DETECTED
COCAINE: NOT DETECTED
OPIATES: NOT DETECTED
Tetrahydrocannabinol: NOT DETECTED

## 2015-10-12 LAB — URINE MICROSCOPIC-ADD ON

## 2015-10-12 MED ORDER — BUDESONIDE 0.5 MG/2ML IN SUSP
2.0000 mL | Freq: Two times a day (BID) | RESPIRATORY_TRACT | Status: DC
  Administered 2015-10-12 – 2015-10-13 (×2): 0.5 mg via RESPIRATORY_TRACT
  Filled 2015-10-12 (×3): qty 2

## 2015-10-12 MED ORDER — FLUOXETINE HCL 20 MG PO CAPS
40.0000 mg | ORAL_CAPSULE | Freq: Every day | ORAL | Status: DC
  Administered 2015-10-12 – 2015-10-13 (×2): 40 mg via ORAL
  Filled 2015-10-12 (×2): qty 2

## 2015-10-12 MED ORDER — WARFARIN - PHARMACIST DOSING INPATIENT: Freq: Every day | Status: DC

## 2015-10-12 MED ORDER — ALBUTEROL SULFATE HFA 108 (90 BASE) MCG/ACT IN AERS: 2.0000 | INHALATION_SPRAY | Freq: Four times a day (QID) | RESPIRATORY_TRACT | Status: DC | PRN

## 2015-10-12 MED ORDER — WARFARIN SODIUM 7.5 MG PO TABS
7.5000 mg | ORAL_TABLET | Freq: Once | ORAL | Status: AC
  Administered 2015-10-12: 7.5 mg via ORAL
  Filled 2015-10-12: qty 1

## 2015-10-12 MED ORDER — POTASSIUM CHLORIDE CRYS ER 20 MEQ PO TBCR
40.0000 meq | EXTENDED_RELEASE_TABLET | Freq: Once | ORAL | Status: AC
  Administered 2015-10-12: 40 meq via ORAL
  Filled 2015-10-12: qty 2

## 2015-10-12 MED ORDER — GABAPENTIN 300 MG PO CAPS
300.0000 mg | ORAL_CAPSULE | Freq: Three times a day (TID) | ORAL | Status: DC
  Administered 2015-10-12 – 2015-10-13 (×4): 300 mg via ORAL
  Filled 2015-10-12 (×3): qty 1

## 2015-10-12 MED ORDER — FUROSEMIDE 40 MG PO TABS
20.0000 mg | ORAL_TABLET | Freq: Every day | ORAL | Status: DC
  Administered 2015-10-12: 20 mg via ORAL
  Filled 2015-10-12: qty 1

## 2015-10-12 MED ORDER — BUDESONIDE 0.5 MG/2ML IN SUSP: 2.0000 mL | Freq: Two times a day (BID) | RESPIRATORY_TRACT | Status: DC

## 2015-10-12 MED ORDER — HYDROXYZINE HCL 25 MG PO TABS: 25.0000 mg | ORAL_TABLET | Freq: Every day | ORAL | Status: DC | PRN

## 2015-10-12 MED ORDER — TRAMADOL HCL 50 MG PO TABS
50.0000 mg | ORAL_TABLET | Freq: Three times a day (TID) | ORAL | Status: DC
  Administered 2015-10-13 (×2): 50 mg via ORAL
  Filled 2015-10-12 (×2): qty 1

## 2015-10-12 MED ORDER — BUDESONIDE 0.5 MG/2ML IN SUSP: 2.0000 mL | RESPIRATORY_TRACT | Status: DC | PRN

## 2015-10-12 MED ORDER — ATENOLOL 25 MG PO TABS
25.0000 mg | ORAL_TABLET | Freq: Every day | ORAL | Status: DC
  Administered 2015-10-12 – 2015-10-13 (×2): 25 mg via ORAL
  Filled 2015-10-12 (×2): qty 1

## 2015-10-12 MED ORDER — PANTOPRAZOLE SODIUM 40 MG PO TBEC
40.0000 mg | DELAYED_RELEASE_TABLET | Freq: Every day | ORAL | Status: DC
  Administered 2015-10-12 – 2015-10-13 (×2): 40 mg via ORAL
  Filled 2015-10-12 (×2): qty 1

## 2015-10-12 MED ORDER — TRAZODONE HCL 100 MG PO TABS
100.0000 mg | ORAL_TABLET | Freq: Every day | ORAL | Status: DC
  Administered 2015-10-12: 100 mg via ORAL
  Filled 2015-10-12: qty 1

## 2015-10-12 MED ORDER — ATORVASTATIN CALCIUM 10 MG PO TABS
10.0000 mg | ORAL_TABLET | Freq: Every day | ORAL | Status: DC
  Administered 2015-10-12: 10 mg via ORAL
  Filled 2015-10-12 (×2): qty 1

## 2015-10-12 MED ORDER — BUSPIRONE HCL 15 MG PO TABS
7.5000 mg | ORAL_TABLET | Freq: Two times a day (BID) | ORAL | Status: DC
  Administered 2015-10-12 – 2015-10-13 (×2): 7.5 mg via ORAL
  Filled 2015-10-12 (×3): qty 1

## 2015-10-12 MED ORDER — FLUTICASONE PROPIONATE 50 MCG/ACT NA SUSP
2.0000 | Freq: Every day | NASAL | Status: DC
  Administered 2015-10-12: 2 via NASAL
  Filled 2015-10-12: qty 16

## 2015-10-12 MED ORDER — AMLODIPINE BESYLATE 5 MG PO TABS
5.0000 mg | ORAL_TABLET | Freq: Every day | ORAL | Status: DC
  Administered 2015-10-12 – 2015-10-13 (×2): 5 mg via ORAL
  Filled 2015-10-12 (×2): qty 1

## 2015-10-12 MED ORDER — WARFARIN SODIUM 5 MG PO TABS: 5.0000 mg | ORAL_TABLET | Freq: Every day | ORAL | Status: DC

## 2015-10-12 MED ORDER — POTASSIUM CHLORIDE CRYS ER 20 MEQ PO TBCR
20.0000 meq | EXTENDED_RELEASE_TABLET | Freq: Every day | ORAL | Status: DC
  Administered 2015-10-12 – 2015-10-13 (×2): 20 meq via ORAL
  Filled 2015-10-12: qty 1
  Filled 2015-10-12: qty 2

## 2015-10-12 MED ORDER — LISINOPRIL 10 MG PO TABS
10.0000 mg | ORAL_TABLET | Freq: Every day | ORAL | Status: DC
  Administered 2015-10-12 – 2015-10-13 (×2): 10 mg via ORAL
  Filled 2015-10-12 (×2): qty 1

## 2015-10-12 NOTE — ED Notes (Signed)
Per pt, states she wants to die-states she cant afford meds-has not had them in 3 weeks-complaining of right knee pain which is chronic issue
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
## 2015-10-12 NOTE — BHH Counselor (Signed)
10/12/15 Referral sent to [place], Earlene Plater, Berton Lan, [place], [place], [place], [place], [place], [place]. [place], [place], and [place] K. Jesus Genera, [place]  Counselor 10/12/2015 6:32 PM

## 2015-10-12 NOTE — Progress Notes (Signed)
CSW met with patient at bedside. Patient expressed to CSW that she feels as though she would have a better life it she would move from [place]. Also, pt expressed frustrations about her income. Patient states that she does not feels as though she is receiving enough money to pay her rent and to buy other things. Patient states that her husband is in a facility and now she depends on her check to pay all bills for the household.  Patient expressed to CSW that she has thoughts about killing her daughter and granddaughter. Patient states that she feels as though they blame her for her husbands declining health.   CSW provided supportive counseling to patient. CSW provided patient with contact information to DSS in order to speak with someone regarding her fiances. The recommended disposition for patient at this time is to seek inpatient treatment.  Willette Brace [phone] ED CSW 10/12/2015 9:07 PM

## 2015-10-12 NOTE — ED Provider Notes (Signed)
CSN: [id number]     Arrival date & time 10/12/15  1314 History   First MD Initiated Contact with Patient 10/12/15 1334     Chief Complaint  Patient presents with  . Suicidal     (Consider location/radiation/quality/duration/timing/severity/associated sxs/prior Treatment) HPI Comments: Patient here complaining of increasing depression times several weeks and now with suicidal ideations without a definitive plan. Denies any auditory or visual hallucinations. Has been seen for similar symptoms in the past and given resources. Denies any somatic complaints of chest pain or shortness of breath or abdominal discomfort. Denies any current ingestions at this time. Symptoms of depression have been progressively worse and nothing makes them better. Patient does endorse new life stressors  The history is provided by the patient.    Past Medical History  Diagnosis Date  . Depression   . Schizophrenia (HCC)   . Hyperlipidemia   . Hypertension   . Chronic pain     "over my whole body" (03/30/2013)  . Pulmonary embolism (HCC) 12/2009    Large central bilateral PE's   . DVT (deep venous thrombosis) (HCC)     per 01/17/10 d/c summary- "remote hx of dvt"?  . Iron deficiency anemia   . Glaucoma   . Hemorrhoids   . Asthma   . Anxiety   . GERD (gastroesophageal reflux disease)   . Psoriasis 04/29/2012    New onset evaluated by Dr Marylou Flesher, Dermatology, [facility] 6/13  Rx 0.1% Tacrolimus ointment  . Complication of anesthesia     "because I have sleep apnea" (03/30/2013)  . Heart murmur   . Chest pain, exertional   . Chronic bronchitis (HCC)   . Sleep apnea     "waiting on my CPAP" (03/30/2013)  . Pneumonia     "a few times" (03/30/2013)  . Exertional shortness of breath     "& sometimes when laying down" (03/30/2013)  . Daily headache     "last 2 months" (03/30/2013)  . Arthritis     "joints" (03/30/2013)  . Varicose veins of legs   . Psoriasis    Past Surgical History  Procedure Laterality  Date  . Spinal fusion      2004  . Carpal tunnel release Bilateral   . Tubal ligation  1973  . Dilation and curettage of uterus  1970's    "once" (03/30/2013)  . 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 Joint replacement    . Vein surgery  left leg   Family History  Problem Relation Age of Onset  . Diabetes Sister   . Colon cancer Brother 40  . Colon cancer Brother    Social History  Substance Use Topics  . Smoking status: Never Smoker   . Smokeless tobacco: Never Used  . Alcohol Use: No   OB History    No data available     Review of Systems  All other systems reviewed and are negative.     Allergies  Review of patient's allergies indicates no known allergies.  Home Medications   Prior to Admission medications   Medication Sig Start Date End Date Taking? Authorizing Provider  acetaminophen (TYLENOL) 500 MG tablet Take 1 tablet (500 mg total) by mouth every 6 (six) hours as needed. Patient taking differently: Take 500 mg by mouth every 6 (six) hours as needed for mild pain.  08/28/15   Cheri Fowler, PA-C  albuterol (PROVENTIL HFA;VENTOLIN HFA) 108 (90 BASE) MCG/ACT inhaler Inhale 2 puffs into the lungs every 6 (six)  hours as needed for wheezing or shortness of breath (wheezing). 12/08/14   Tyrone Nine, MD  amLODipine (NORVASC) 5 MG tablet Take 5 mg by mouth daily.  09/16/15 09/15/16  Historical Provider, MD  atenolol (TENORMIN) 25 MG tablet Take 25 mg by mouth daily.    Historical Provider, MD  atorvastatin (LIPITOR) 10 MG tablet Take 10 mg by mouth at bedtime.    Historical Provider, MD  busPIRone (BUSPAR) 7.5 MG tablet Take 1 tablet (7.5 mg total) by mouth 2 (two) times daily. 10/02/15   Charm Rings, NP  cephALEXin (KEFLEX) 500 MG capsule Take 1 capsule (500 mg total) by mouth every 6 (six) hours. 09/26/15   Charm Rings, NP  cyclobenzaprine (FLEXERIL) 10 MG tablet Take 1 tablet (10 mg total) by mouth 2 (two) times daily as needed for muscle spasms. 08/20/15   Elpidio Anis, PA-C  esomeprazole (NEXIUM) 40 MG capsule Take 1 capsule (40 mg total) by mouth daily. 09/07/15   Roxy Horseman, PA-C  FLUoxetine (PROZAC) 40 MG capsule Take 1 capsule (40 mg total) by mouth daily. 10/02/15   Charm Rings, NP  fluticasone (FLOVENT HFA) 220 MCG/ACT inhaler Inhale 2 puffs into the lungs daily.  09/16/15   Historical Provider, MD  Fluticasone Propionate, Inhal, (FLOVENT DISKUS) 250 MCG/BLIST AEPB Inhale 2 puffs into the lungs every 4 (four) hours as needed (for shortness of breath and coughing).    Historical Provider, MD  furosemide (LASIX) 20 MG tablet Take 1 tablet (20 mg total) by mouth daily. 10/29/14   Tyrone Nine, MD  gabapentin (NEURONTIN) 300 MG capsule Take 1 capsule (300 mg total) by mouth 3 (three) times daily. 10/02/15   Charm Rings, NP  hydrOXYzine (VISTARIL) 25 MG capsule Take 1 capsule (25 mg total) by mouth daily as needed for anxiety. 10/02/15   Charm Rings, NP  lisinopril (PRINIVIL,ZESTRIL) 10 MG tablet Take 10 mg by mouth daily.  09/16/15   Historical Provider, MD  meclizine (ANTIVERT) 25 MG tablet Take 1 tablet (25 mg total) by mouth 3 (three) times daily as needed for dizziness. 02/01/15   Arby Barrette, MD  mometasone [facility]) 220 MCG/INH inhaler Inhale 2 puffs into the lungs daily.    Historical Provider, MD  potassium chloride SA (K-DUR,KLOR-CON) 20 MEQ tablet Take 20 mEq by mouth daily.    Historical Provider, MD  silver sulfADIAZINE (SILVADENE) 1 % cream APPLY TOPICALLY EVERY DAY 12/10/14   Tyrone Nine, MD  traMADol (ULTRAM) 50 MG tablet Take 50 mg by mouth 3 (three) times daily.  09/16/15 10/16/15  Historical Provider, MD  traZODone (DESYREL) 100 MG tablet Take 1 tablet (100 mg total) by mouth at bedtime. 10/02/15   Charm Rings, NP  triamcinolone cream (KENALOG) 0.1 % Apply 1 application topically 2 (two) times daily as needed (for legs). 10/29/14   Tyrone Nine, MD  warfarin (COUMADIN) 5 MG tablet TAKE 1 TABLET BY MOUTH EVERY DAY Patient taking  differently: TAKE 1.5  TABLETS (7.5 mg)  BY MOUTH EVERY DAY 10/18/14   Levert Feinstein, MD   BP 146/81 mmHg  Pulse 100  Temp(Src) 98.3 F (36.8 C) (Oral)  Resp 16  SpO2 100%  LMP 11/22/1996 Physical Exam  Constitutional: She is oriented to person, place, and time. She appears well-developed and well-nourished.  Non-toxic appearance. No distress.  HENT:  Head: Normocephalic and atraumatic.  Eyes: Conjunctivae, EOM and lids are normal. Pupils are equal, round, and reactive to  light.  Neck: Normal range of motion. Neck supple. No tracheal deviation present. No thyroid mass present.  Cardiovascular: Normal rate, regular rhythm and normal heart sounds.  Exam reveals no gallop.   No murmur heard. Pulmonary/Chest: Effort normal and breath sounds normal. No stridor. No respiratory distress. She has no decreased breath sounds. She has no wheezes. She has no rhonchi. She has no rales.  Abdominal: Soft. Normal appearance and bowel sounds are normal. She exhibits no distension. There is no tenderness. There is no rebound and no CVA tenderness.  Musculoskeletal: Normal range of motion. She exhibits no edema or tenderness.  Neurological: She is alert and oriented to person, place, and time. She has normal strength. No cranial nerve deficit or sensory deficit. GCS eye subscore is 4. GCS verbal subscore is 5. GCS motor subscore is 6.  Skin: Skin is warm and dry. No abrasion and no rash noted.  Psychiatric: Her affect is blunt. Her speech is delayed. She is slowed. Thought content is not paranoid and not delusional. She expresses suicidal ideation. She expresses no homicidal ideation.  Nursing note and vitals reviewed.   ED Course  Procedures (including critical care time) Labs Review Labs Reviewed  URINE CULTURE  CBC WITH DIFFERENTIAL/PLATELET  COMPREHENSIVE METABOLIC PANEL  URINALYSIS, ROUTINE W REFLEX MICROSCOPIC (NOT AT [facility])  URINE RAPID DRUG SCREEN, HOSP PERFORMED  PROTIME-INR     Imaging Review No results found. I have personally reviewed and evaluated these images and lab results as part of my medical decision-making.   EKG Interpretation None      MDM   Final diagnoses:  None    Pt medically cleared and will be dispo by TTS    Lorre Nick, MD 10/14/15 [phone]

## 2015-10-12 NOTE — Progress Notes (Addendum)
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

## 2015-10-12 NOTE — ED Notes (Signed)
Bed: ZO10 Expected date:  Expected time:  Means of arrival:  Comments: Hold for triage 5

## 2015-10-12 NOTE — ED Notes (Signed)
Unable to collect labs patient talking with TTS

## 2015-10-12 NOTE — Progress Notes (Signed)
ANTICOAGULATION CONSULT NOTE - Initial Consult  Pharmacy Consult for Warfarin Indication: Hx of PE/DVT  No Known Allergies  Patient Measurements:   Total body weight: 108.9 kg 12/16  Vital Signs: Temp: 98.3 F (36.8 C) (01/18 1321) Temp Source: Oral (01/18 1321) BP: 146/81 mmHg (01/18 1321) Pulse Rate: 100 (01/18 1321)  Labs: No results for input(s): HGB, HCT, PLT, APTT, LABPROT, INR, HEPARINUNFRC, CREATININE, CKTOTAL, CKMB, TROPONINI in the last 72 hours.  CrCl cannot be calculated (Unknown ideal weight.).  Medical History: Past Medical History  Diagnosis Date  . Depression   . Schizophrenia (HCC)   . Hyperlipidemia   . Hypertension   . Chronic pain     "over my whole body" (03/30/2013)  . Pulmonary embolism (HCC) 12/2009    Large central bilateral PE's   . DVT (deep venous thrombosis) (HCC)     per 01/17/10 d/c summary- "remote hx of dvt"?  . Iron deficiency anemia   . Glaucoma   . Hemorrhoids   . Asthma   . Anxiety   . GERD (gastroesophageal reflux disease)   . Psoriasis 04/29/2012    New onset evaluated by Dr Marylou Flesher, Dermatology, [facility] 6/13  Rx 0.1% Tacrolimus ointment  . Complication of anesthesia     "because I have sleep apnea" (03/30/2013)  . Heart murmur   . Chest pain, exertional   . Chronic bronchitis (HCC)   . Sleep apnea     "waiting on my CPAP" (03/30/2013)  . Pneumonia     "a few times" (03/30/2013)  . Exertional shortness of breath     "& sometimes when laying down" (03/30/2013)  . Daily headache     "last 2 months" (03/30/2013)  . Arthritis     "joints" (03/30/2013)  . Varicose veins of legs   . Psoriasis    Medications:  Scheduled:  . amLODipine  5 mg Oral Daily  . atenolol  25 mg Oral Daily  . atorvastatin  10 mg Oral QHS  . budesonide  2 mL Inhalation BID  . busPIRone  7.5 mg Oral BID  . FLUoxetine  40 mg Oral Daily  . fluticasone  2 spray Each Nare Daily  . furosemide  20 mg Oral Daily  . gabapentin  300 mg Oral TID  .  lisinopril  10 mg Oral Daily  . pantoprazole  40 mg Oral Daily  . potassium chloride SA  20 mEq Oral Daily  . traMADol  50 mg Oral TID  . traZODone  100 mg Oral QHS   Assessment: 74 yoF to ED with SI, states wants to die, cannot afford meds. Previous ED visits/admits 12/3, 12/14, 12/27, 1/6, 1/15, now back in ED with c/o chronic pain issues.  Patient has stated previously she wants to be placed in SNF, does not qualify.  Hx of schizophrenia, SI.  Chronic Warfarin for hx of DVT/PE: per progress notes 2014 - PE diagnosed 4/11, and "remote hx of DVT".  Available chest CT's 10/16/10 & 12/01/13 negative for any acute process.  We have a record of INR's back to 2008  Warfarin resumed by ED MD, requested Pharmacy to dose with patient non-compliance  Baseline INR today 1.44  Home Warfarin dose noted as 7.5mg  daily, last dose unknown  Goal of Therapy:  INR 2-3 Monitor platelets by anticoagulation protocol: Yes   Plan:   Warfarin 7.5mg  today  Daily PT/INR  Suggest stopping anti-coagulation with last PE noted 2011  Otho Bellows PharmD Pager [phone] 10/12/2015, 3:16  PM

## 2015-10-12 NOTE — BH Assessment (Signed)
Tele Assessment Note   [name] [name] is an 75 y.o. female. Pt reports SI with no plan. Pt denies HI. Pt denies AVH. According to the Pt, she has been diagnosed with schizophrenia. Pt states she lives alone and does not have anything to live for. Pt states she is about to lose her home, and she is having financial difficulties. Pt denies any family support. Pt states she is married but her husband is in a home. Pt reports decreased sleep appetite. According to the Pt, she has been receiving mental health treatment all of her life. Pt has been seen at [facility]. Pt was hospitalized at [facility] earlier this month. 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  Writer consulted with Julieanne Cotton, NP. Per Julieanne Cotton, NP Pt meets inpatient criteria. TTS to seek placement.  Diagnosis:  F20.0 Schizophrenia  Past Medical History:  Past Medical History  Diagnosis Date  . Depression   . Schizophrenia (HCC)   . Hyperlipidemia   . Hypertension   . Chronic pain     "over my whole body" (03/30/2013)  . Pulmonary embolism (HCC) 12/2009    Large central bilateral PE's   . DVT (deep venous thrombosis) (HCC)     per 01/17/10 d/c summary- "remote hx of dvt"?  . Iron deficiency anemia   . Glaucoma   . Hemorrhoids   . Asthma   . Anxiety   . GERD (gastroesophageal reflux disease)   . Psoriasis 04/29/2012    New onset evaluated by Dr Marylou Flesher, Dermatology, [facility] 6/13  Rx 0.1% Tacrolimus ointment  . Complication of anesthesia     "because I have sleep apnea" (03/30/2013)  . Heart murmur   . Chest pain, exertional   . Chronic bronchitis (HCC)   . Sleep apnea     "waiting on my CPAP" (03/30/2013)  . Pneumonia     "a few times" (03/30/2013)  . Exertional shortness of breath     "& sometimes when laying down" (03/30/2013)  . Daily headache     "last 2 months" (03/30/2013)  . Arthritis     "joints" (03/30/2013)  . Varicose veins of legs   . Psoriasis     Past Surgical History   Procedure Laterality Date  . Spinal fusion      2004  . Carpal tunnel release Bilateral   . Tubal ligation  1973  . Dilation and curettage of uterus  1970's    "once" (03/30/2013)  . 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 Joint replacement    . Vein surgery  left leg    Family History:  Family History  Problem Relation Age of Onset  . Diabetes Sister   . Colon cancer Brother 40  . Colon cancer Brother     Social History:  reports that she has never smoked. She has never used smokeless tobacco. She reports that she does not drink alcohol or use illicit drugs.  Additional Social History:  Alcohol / Drug Use Pain Medications: unknown Prescriptions: unknown Over the Counter: unknown History of alcohol / drug use?: No history of alcohol / drug abuse Longest period of sobriety (when/how long): unknown  CIWA: CIWA-Ar BP: 146/81 mmHg Pulse Rate: 100 COWS:    PATIENT STRENGTHS: (choose at least two) Capable of independent living Communication skills  Allergies: No Known Allergies  Home Medications:  (Not in a hospital admission)  OB/GYN Status:  Patient's last menstrual period was 11/22/1996.  General Assessment Data Location  of Assessment: [facility] TTS Assessment: In system Is this a Tele or Face-to-Face Assessment?: Tele Assessment Is this an Initial Assessment or a Re-assessment for this encounter?: Initial Assessment Marital status: Married Louisa name: NA Is patient pregnant?: No Pregnancy Status: No Living Arrangements: Alone Can pt return to current living arrangement?: Yes Admission Status: Voluntary Is patient capable of signing voluntary admission?: No Referral Source: Self/Family/Friend Insurance type: [facility]     Crisis Care Plan Living Arrangements: Alone Legal Guardian: Other: (self) Name of Psychiatrist: Transport planner Name of Therapist: [facility]  Education Status Is patient currently in school?: No Current Grade: NA Highest grade of school patient has  completed: 12th grade Name of school: NA Contact person: NA  Risk to self with the past 6 months Suicidal Ideation: Yes-Currently Present Has patient been a risk to self within the past 6 months prior to admission? : No Suicidal Intent: No Has patient had any suicidal intent within the past 6 months prior to admission? : No Is patient at risk for suicide?: No Suicidal Plan?: No Has patient had any suicidal plan within the past 6 months prior to admission? : No Specify Current Suicidal Plan: Pt does not know Access to Means: No What has been your use of drugs/alcohol within the last 12 months?: NA Previous Attempts/Gestures: No How many times?: 0 Other Self Harm Risks: No Triggers for Past Attempts: None known Intentional Self Injurious Behavior: None Family Suicide History: No Recent stressful life event(s): Other (Comment), Conflict (Comment) (lonely and conflict with family) Persecutory voices/beliefs?: No Depression: Yes Depression Symptoms: Insomnia, Fatigue, Loss of interest in usual pleasures, Feeling worthless/self pity, Feeling angry/irritable, Isolating Substance abuse history and/or treatment for substance abuse?: No Suicide prevention information given to non-admitted patients: Not applicable  Risk to Others within the past 6 months Homicidal Ideation: No Does patient have any lifetime risk of violence toward others beyond the six months prior to admission? : No Thoughts of Harm to Others: No Comment - Thoughts of Harm to Others: none Current Homicidal Intent: No Current Homicidal Plan: No Describe Current Homicidal Plan: NA Access to Homicidal Means: No Identified Victim: NA History of harm to others?: No Assessment of Violence: None Noted Violent Behavior Description: NA Does patient have access to weapons?: No Criminal Charges Pending?: No Describe Pending Criminal Charges: NA Does patient have a court date: Yes Court Date: 10/04/15 Is patient on  probation?: Unknown  Psychosis Hallucinations: None noted Delusions: None noted  Mental Status Report Appearance/Hygiene: In scrubs Eye Contact: Fair Motor Activity: Freedom of movement Speech: Logical/coherent Level of Consciousness: Alert Mood: Depressed, Sad Affect: Depressed, Sad Anxiety Level: Severe Thought Processes: Coherent, Relevant Judgement: Unimpaired Orientation: Person, Place, Time, Situation, Appropriate for developmental age Obsessive Compulsive Thoughts/Behaviors: None  Cognitive Functioning Concentration: Normal Memory: Recent Intact, Remote Intact IQ: Average Insight: Fair Impulse Control: Fair Appetite: Poor Weight Loss: 0 Weight Gain: 0 Sleep: Decreased Total Hours of Sleep: 5 Vegetative Symptoms: None  ADLScreening [facility] Assessment Services) Patient's cognitive ability adequate to safely complete daily activities?: Yes Patient able to express need for assistance with ADLs?: Yes Independently performs ADLs?: Yes (appropriate for developmental age)  Prior Inpatient Therapy Prior Inpatient Therapy: Yes Prior Therapy Dates: 03/2015, 07/2015 Prior Therapy Facilty/Provider(s): [facility] Reason for Treatment: HI/Depression  Prior Outpatient Therapy Prior Outpatient Therapy: Yes Prior Therapy Dates: 20 years ago Prior Therapy Facilty/Provider(s): [facility] Reason for Treatment: Depression Does patient have an ACCT team?: No Does patient have Intensive In-House Services?  : No Does patient  have [facility] services? : No Does patient have P4CC services?: No  ADL Screening (condition at time of admission) Patient's cognitive ability adequate to safely complete daily activities?: Yes Is the patient deaf or have difficulty hearing?: No Does the patient have difficulty seeing, even when wearing glasses/contacts?: No Does the patient have difficulty concentrating, remembering, or making decisions?: No Patient able to express need for assistance with  ADLs?: Yes Does the patient have difficulty dressing or bathing?: No Independently performs ADLs?: Yes (appropriate for developmental age)       Abuse/Neglect Assessment (Assessment to be complete while patient is alone) Physical Abuse: Denies Verbal Abuse: Denies Sexual Abuse: Denies Exploitation of patient/patient's resources: Denies Self-Neglect: Denies     Merchant navy officer (For Healthcare) Does patient have an advance directive?: No Would patient like information on creating an advanced directive?: No - patient declined information    Additional Information 1:1 In Past 12 Months?: No CIRT Risk: No Elopement Risk: No Does patient have medical clearance?: Yes     Disposition:  Disposition Initial Assessment Completed for this Encounter: Yes Disposition of Patient: Inpatient treatment program Type of inpatient treatment program: Adult  Jemarcus Dougal D 10/12/2015 2:42 PM

## 2015-10-12 NOTE — ED Notes (Signed)
Called RT, spoke with Misty Stanley.  Misty Stanley will do tx.

## 2015-10-12 NOTE — Progress Notes (Signed)
Entered resources in d/c instructions senior resources of [place] Call Individuals must be ambulatory to receive rides through the [employer]. Volunteer drivers from the faith community provide the rides to scheduled medical appts. Lowell Guitar, Senior Wheels Program Director [phone] from [place]  [phone] from all other areas Provides rides for seniors age 73 & over who are ambulatory to medical appointments in [facility] & to regional medical facilities.  Medicaid [place] Access Covered Patient [place]: [phone] [address] [place], [place] [address] [email or web address] USE THIS WEBSITE TO ASSIST WITH UNDERSTANDING YOUR COVERAGE, RENEW APPLICATION As a Medicaid client you MUST contact DSS/SSI each time you change address, move to another [place] county or another state to keep your address updated [place] Medicaid Transportation to Dr appts if you are have full Medicaid: [phone], [phone]   [employer] Housing Authorities: [place]: [phone]   FINANCIAL ASSISTANCE [facility] [place] [phone] [place] [employer] [address] [place], [place], [place] " Department of Social Services [phone] **[place] [employer]: [phone], [phone] ([address]), [place], [place]  " [facility] [place]: [phone], [address] [place]

## 2015-10-12 NOTE — ED Notes (Signed)
Informed Dr. Donnald Garre of pt's K+ of 3.1.

## 2015-10-13 LAB — URINE CULTURE

## 2015-10-13 LAB — PROTIME-INR
INR: 1.36 (ref 0.00–1.49)
PROTHROMBIN TIME: 16.9 s — AB (ref 11.6–15.2)

## 2015-10-13 NOTE — ED Notes (Signed)
Pt again stating "there's something wrong with me.  They just did a cxr on Monday and their office @ [facility] called and told me to come in @ 1815."  Pt was informed of conversation with Julieanne Cotton the cxr was done so they could find placement.  Pt stated "I don't belong here.  I just need to know what's wrong with me.  I don't understand how they can do this to me.  What do you think about these bruises the police left on my arm?"  Encouraged pt to go to sleep.  Pt stated "I'm not going to sleep until I know what's going on."  Informed pt will know more in the morning but right now, it's night time.  Pt continues sitting in recliner.

## 2015-10-13 NOTE — ED Notes (Signed)
[facility] called for tx, report called to [facility]

## 2015-10-13 NOTE — ED Notes (Signed)
Received call from Candace, RN with [facility].  She was questioning if the pt was going to be IVC'd.  Informed is not at this time.  She stated "I haven't talked to the doctor about her yet but she will need reliable transportation back home.  Do you know if the transportation service will transport her back home?"  Informed her am not aware.  Candace stated "that's okay."

## 2015-10-13 NOTE — BH Assessment (Signed)
[facility] Assessment Progress Note   [facility] to say that their Dr. Althea Grimmer had accepted patient.  Patient is voluntary so Juel Burrow will do transportation.  Nurse call report to [phone].  Candace asked that patient report be done as patient is leaving [facility].  Patient will need reliable transportation back home upon discharge.

## 2015-10-13 NOTE — ED Notes (Signed)
Nurse drawing labs. 

## 2015-10-22 ENCOUNTER — Encounter (HOSPITAL_COMMUNITY): Payer: Self-pay

## 2015-10-22 ENCOUNTER — Emergency Department (HOSPITAL_COMMUNITY)
Admission: EM | Admit: 2015-10-22 | Discharge: 2015-10-22 | Disposition: A | Discharge status: Home / Self Care | Payer: Medicare Other | Attending: Emergency Medicine | Admitting: Emergency Medicine

## 2015-10-22 DIAGNOSIS — F329 Major depressive disorder, single episode, unspecified: Secondary | ICD-10-CM | POA: Insufficient documentation

## 2015-10-22 DIAGNOSIS — I83813 Varicose veins of bilateral lower extremities with pain: Secondary | ICD-10-CM | POA: Insufficient documentation

## 2015-10-22 DIAGNOSIS — E785 Hyperlipidemia, unspecified: Secondary | ICD-10-CM | POA: Insufficient documentation

## 2015-10-22 DIAGNOSIS — Z872 Personal history of diseases of the skin and subcutaneous tissue: Secondary | ICD-10-CM | POA: Insufficient documentation

## 2015-10-22 DIAGNOSIS — Z79899 Other long term (current) drug therapy: Secondary | ICD-10-CM | POA: Insufficient documentation

## 2015-10-22 DIAGNOSIS — M79605 Pain in left leg: Secondary | ICD-10-CM

## 2015-10-22 DIAGNOSIS — I8390 Asymptomatic varicose veins of unspecified lower extremity: Secondary | ICD-10-CM

## 2015-10-22 DIAGNOSIS — K219 Gastro-esophageal reflux disease without esophagitis: Secondary | ICD-10-CM | POA: Insufficient documentation

## 2015-10-22 DIAGNOSIS — R011 Cardiac murmur, unspecified: Secondary | ICD-10-CM | POA: Insufficient documentation

## 2015-10-22 DIAGNOSIS — G4730 Sleep apnea, unspecified: Secondary | ICD-10-CM | POA: Insufficient documentation

## 2015-10-22 DIAGNOSIS — Z9981 Dependence on supplemental oxygen: Secondary | ICD-10-CM | POA: Insufficient documentation

## 2015-10-22 DIAGNOSIS — M79604 Pain in right leg: Secondary | ICD-10-CM

## 2015-10-22 DIAGNOSIS — G8929 Other chronic pain: Secondary | ICD-10-CM | POA: Insufficient documentation

## 2015-10-22 DIAGNOSIS — Z760 Encounter for issue of repeat prescription: Secondary | ICD-10-CM | POA: Insufficient documentation

## 2015-10-22 DIAGNOSIS — Z7951 Long term (current) use of inhaled steroids: Secondary | ICD-10-CM | POA: Insufficient documentation

## 2015-10-22 DIAGNOSIS — Z86711 Personal history of pulmonary embolism: Secondary | ICD-10-CM | POA: Insufficient documentation

## 2015-10-22 DIAGNOSIS — F419 Anxiety disorder, unspecified: Secondary | ICD-10-CM | POA: Insufficient documentation

## 2015-10-22 DIAGNOSIS — Z7902 Long term (current) use of antithrombotics/antiplatelets: Secondary | ICD-10-CM | POA: Insufficient documentation

## 2015-10-22 DIAGNOSIS — Z792 Long term (current) use of antibiotics: Secondary | ICD-10-CM | POA: Insufficient documentation

## 2015-10-22 DIAGNOSIS — I10 Essential (primary) hypertension: Secondary | ICD-10-CM | POA: Insufficient documentation

## 2015-10-22 DIAGNOSIS — M1990 Unspecified osteoarthritis, unspecified site: Secondary | ICD-10-CM | POA: Insufficient documentation

## 2015-10-22 DIAGNOSIS — J45909 Unspecified asthma, uncomplicated: Secondary | ICD-10-CM | POA: Insufficient documentation

## 2015-10-22 DIAGNOSIS — Z862 Personal history of diseases of the blood and blood-forming organs and certain disorders involving the immune mechanism: Secondary | ICD-10-CM | POA: Insufficient documentation

## 2015-10-22 DIAGNOSIS — Z86718 Personal history of other venous thrombosis and embolism: Secondary | ICD-10-CM | POA: Insufficient documentation

## 2015-10-22 DIAGNOSIS — Z8701 Personal history of pneumonia (recurrent): Secondary | ICD-10-CM | POA: Insufficient documentation

## 2015-10-22 MED ORDER — KETOROLAC TROMETHAMINE 60 MG/2ML IM SOLN
60.0000 mg | Freq: Once | INTRAMUSCULAR | Status: AC
  Administered 2015-10-22: 60 mg via INTRAMUSCULAR
  Filled 2015-10-22: qty 2

## 2015-10-22 NOTE — ED Provider Notes (Signed)
CSN: [id number]     Arrival date & time 10/22/15  1750 History   First MD Initiated Contact with Patient 10/22/15 1806     Chief Complaint  Patient presents with  . Leg Pain  . Medication Refill   HPI  [name] is a 75 year old female with PMHx of schizophrenia, HTN, chronic pain, DVT/PE on coumadin and varicose veins presenting with bilateral leg pain. The leg pain has been present for "many, many years". She describes the pain as sharp, shooting and cramping. She states that this pain is consistent with her varicose veins. She previously was followed by vascular for this but she has not gone back for a follow up visit in over a year. Her pain is exacerbated by walking or standing for long periods of time. She is supposed to wear compression stockings for her varicose veins but has not been recently. She states that she is out of her pain medications but is unsure which pain medicines she was prescribed. She state that her husband is in a nursing facility and her son does not live here so she does not have a way to get to her PCP appointments or the pharmacy. She has multiple prescriptions and bottles with refills on them but she states she has been unable to get to the pharmacy. She states that the pain she is experiencing today is unchanged from the past few years. She has no other complaints today.   Past Medical History  Diagnosis Date  . Depression   . Schizophrenia (HCC)   . Hyperlipidemia   . Hypertension   . Chronic pain     "over my whole body" (03/30/2013)  . Pulmonary embolism (HCC) 12/2009    Large central bilateral PE's   . DVT (deep venous thrombosis) (HCC)     per 01/17/10 d/c summary- "remote hx of dvt"?  . Iron deficiency anemia   . Glaucoma   . Hemorrhoids   . Asthma   . Anxiety   . GERD (gastroesophageal reflux disease)   . Psoriasis 04/29/2012    New onset evaluated by Dr Marylou Flesher, Dermatology, [facility] 6/13  Rx 0.1% Tacrolimus ointment  . Complication of  anesthesia     "because I have sleep apnea" (03/30/2013)  . Heart murmur   . Chest pain, exertional   . Chronic bronchitis (HCC)   . Sleep apnea     "waiting on my CPAP" (03/30/2013)  . Pneumonia     "a few times" (03/30/2013)  . Exertional shortness of breath     "& sometimes when laying down" (03/30/2013)  . Daily headache     "last 2 months" (03/30/2013)  . Arthritis     "joints" (03/30/2013)  . Varicose veins of legs   . Psoriasis    Past Surgical History  Procedure Laterality Date  . Spinal fusion      2004  . Carpal tunnel release Bilateral   . Tubal ligation  1973  . Dilation and curettage of uterus  1970's    "once" (03/30/2013)  . 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 Joint replacement    . Vein surgery  left leg   Family History  Problem Relation Age of Onset  . Diabetes Sister   . Colon cancer Brother 40  . Colon cancer Brother    Social History  Substance Use Topics  . Smoking status: Never Smoker   . Smokeless tobacco: Never Used  . Alcohol Use: No   OB  History    No data available     Review of Systems  Musculoskeletal: Positive for myalgias.  All other systems reviewed and are negative.     Allergies  Review of patient's allergies indicates no known allergies.  Home Medications   Prior to Admission medications   Medication Sig Start Date End Date Taking? Authorizing Provider  acetaminophen (TYLENOL) 500 MG tablet Take 1 tablet (500 mg total) by mouth every 6 (six) hours as needed. Patient taking differently: Take 500 mg by mouth every 6 (six) hours as needed for mild pain.  08/28/15   Cheri Fowler, PA-C  albuterol (PROVENTIL HFA;VENTOLIN HFA) 108 (90 BASE) MCG/ACT inhaler Inhale 2 puffs into the lungs every 6 (six) hours as needed for wheezing or shortness of breath (wheezing). 12/08/14   Tyrone Nine, MD  amLODipine (NORVASC) 5 MG tablet Take 5 mg by mouth daily.  09/16/15 09/15/16  Historical Provider, MD  atenolol (TENORMIN) 25 MG tablet Take 25 mg by  mouth daily.    Historical Provider, MD  atorvastatin (LIPITOR) 10 MG tablet Take 10 mg by mouth at bedtime.    Historical Provider, MD  busPIRone (BUSPAR) 7.5 MG tablet Take 1 tablet (7.5 mg total) by mouth 2 (two) times daily. 10/02/15   Charm Rings, NP  cephALEXin (KEFLEX) 500 MG capsule Take 1 capsule (500 mg total) by mouth every 6 (six) hours. 09/26/15   Charm Rings, NP  cyclobenzaprine (FLEXERIL) 10 MG tablet Take 1 tablet (10 mg total) by mouth 2 (two) times daily as needed for muscle spasms. 08/20/15   Elpidio Anis, PA-C  esomeprazole (NEXIUM) 40 MG capsule Take 1 capsule (40 mg total) by mouth daily. 09/07/15   Roxy Horseman, PA-C  FLUoxetine (PROZAC) 40 MG capsule Take 1 capsule (40 mg total) by mouth daily. 10/02/15   Charm Rings, NP  fluticasone (FLOVENT HFA) 220 MCG/ACT inhaler Inhale 2 puffs into the lungs daily.  09/16/15   Historical Provider, MD  Fluticasone Propionate, Inhal, (FLOVENT DISKUS) 250 MCG/BLIST AEPB Inhale 2 puffs into the lungs every 4 (four) hours as needed (for shortness of breath and coughing).    Historical Provider, MD  furosemide (LASIX) 20 MG tablet Take 1 tablet (20 mg total) by mouth daily. 10/29/14   Tyrone Nine, MD  gabapentin (NEURONTIN) 300 MG capsule Take 1 capsule (300 mg total) by mouth 3 (three) times daily. 10/02/15   Charm Rings, NP  hydrOXYzine (VISTARIL) 25 MG capsule Take 1 capsule (25 mg total) by mouth daily as needed for anxiety. 10/02/15   Charm Rings, NP  lisinopril (PRINIVIL,ZESTRIL) 10 MG tablet Take 10 mg by mouth daily.  09/16/15   Historical Provider, MD  meclizine (ANTIVERT) 25 MG tablet Take 1 tablet (25 mg total) by mouth 3 (three) times daily as needed for dizziness. 02/01/15   Arby Barrette, MD  mometasone [facility]) 220 MCG/INH inhaler Inhale 2 puffs into the lungs daily.    Historical Provider, MD  potassium chloride SA (K-DUR,KLOR-CON) 20 MEQ tablet Take 20 mEq by mouth daily.    Historical Provider, MD  silver sulfADIAZINE  (SILVADENE) 1 % cream APPLY TOPICALLY EVERY DAY 12/10/14   Tyrone Nine, MD  traZODone (DESYREL) 100 MG tablet Take 1 tablet (100 mg total) by mouth at bedtime. 10/02/15   Charm Rings, NP  triamcinolone cream (KENALOG) 0.1 % Apply 1 application topically 2 (two) times daily as needed (for legs). 10/29/14   Tyrone Nine, MD  warfarin (COUMADIN) 5 MG tablet TAKE 1 TABLET BY MOUTH EVERY DAY Patient taking differently: TAKE 1.5  TABLETS (7.5 mg)  BY MOUTH EVERY DAY 10/18/14   Levert Feinstein, MD   BP 139/88 mmHg  Pulse 76  Temp(Src) 99.1 F (37.3 C) (Oral)  Resp 18  SpO2 95%  LMP 11/22/1996 Physical Exam  Constitutional: She appears well-developed and well-nourished. No distress.  HENT:  Head: Normocephalic and atraumatic.  Eyes: Conjunctivae are normal. Right eye exhibits no discharge. Left eye exhibits no discharge. No scleral icterus.  Neck: Normal range of motion.  Cardiovascular: Normal rate, regular rhythm and intact distal pulses.   Pedal pulse palpable  Pulmonary/Chest: Effort normal. No respiratory distress.  Musculoskeletal: Normal range of motion.  Varicose veins noted to bilateral legs. Diffuse tenderness over lower extremities. No focal tenderness. No edema, induration or erythema. No unilateral calf swelling. FROM at hip, knee, ankle and toes intact. Pt is able to ambulate with assistance of her cane.   Neurological: She is alert. Coordination normal.  5/5 strength of BLE. Sensation to light touch intact throughout.   Skin: Skin is warm and dry. No erythema.  Psychiatric: She has a normal mood and affect. Her behavior is normal.  Nursing note and vitals reviewed.   ED Course  Procedures (including critical care time) Labs Review Labs Reviewed - No data to display  Imaging Review No results found. I have personally reviewed and evaluated these images and lab results as part of my medical decision-making.   EKG Interpretation None      MDM   Final diagnoses:   Bilateral leg pain  Varicose vein   Patient presenting with bilateral lower extremity pain related to varicose veins. Pain is chronic in nature and has been unchanged for many years.  Lower extremities are neurovascularly intact with FROM. Pain managed in ED with toradol. Pt is able to ambulate with a steady gait. Contacted case management for outpatient resources for help getting to her PCP and pharmacies. Case manager had left for the evening but pt has been seen for similar issues in the past with outpatient resources given. Found old notes from CM and discussed using the services provided. Pt confirms she had not used the seniors on wheels or other programs given to her. Encouraged pt to use these resources.  Pt advised to follow up with PCP if symptoms persist. Return precautions discussed at bedside and given in discharge paperwork. Pt is stable for discharge.    Rolm Gala Trelon Plush, PA-C 10/22/15 2028  Cathren Laine, MD 10/24/15 0730

## 2015-10-22 NOTE — ED Notes (Signed)
Bed: ZO10 Expected date: 10/22/15 Expected time: 5:49 PM Means of arrival: Ambulance Comments: Leg pain, out of meds

## 2015-10-22 NOTE — ED Notes (Signed)
Pt reports that she has been unable to get to her PCP and get refills of Atorvastatin, Atenolol, Neurontin, and "other pain medication."
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
## 2015-10-22 NOTE — ED Notes (Signed)
[facility] was called for pt's transportation. 

## 2015-10-22 NOTE — Discharge Instructions (Signed)
Senior resources of [employer]  - Network engineer. Volunteer drivers from the faith community provide the rides to scheduled medical appts. Lowell Guitar, Senior Wheels Program Director [phone] from [place]& [place]  [phone] from all other areas Provides rides for seniors age 73 & over who are ambulatory to medical appointments in [facility] & to regional medical facilities.   - Medicaid [place] Access Covered Patient [place]: [phone] [address] [place], [place] [address] [employer] USE THIS WEBSITE TO ASSIST WITH UNDERSTANDING YOUR COVERAGE, RENEW APPLICATION As a Medicaid client you MUST contact DSS/SSI each time you change address, move to another [place] county or another state to keep your address updated [name] [name] Medicaid Transportation to Dr appts if you are have full Medicaid: [phone], [phone]   [employer] Housing Authorities: [place]: [phone]   FINANCIAL ASSISTANCE [facility] [place] [phone] [employer] [place] [address] [place], [place], [place] " Department of Social Services [phone] **[employer]: [phone], [address] ([address]viously [address] [address]), [place], [place]  " [facility] [place]: [phone], [address]   Varicose Veins Varicose veins are veins that have become enlarged and twisted. They are usually seen in the legs but can occur in other parts of the body as well. CAUSES This condition is the result of valves in the veins not working properly. Valves in the veins help to return blood from the leg to the heart. If these valves are damaged, blood flows backward and backs up into the veins in the leg near the skin. This causes the veins to become larger. RISK FACTORS People who are on their feet a lot, who are pregnant, or who are overweight are more likely to develop varicose veins. SIGNS AND SYMPTOMS  Bulging,  twisted-appearing, bluish veins, most commonly found on the legs.  Leg pain or a feeling of heaviness. These symptoms may be worse at the end of the day.  Leg swelling.  Changes in skin color. DIAGNOSIS A health care provider can usually diagnose varicose veins by examining your legs. Your health care provider may also recommend an ultrasound of your leg veins. TREATMENT Most varicose veins can be treated at home.However, other treatments are available for people who have persistent symptoms or want to improve the cosmetic appearance of the varicose veins. These treatment options include:  Sclerotherapy. A solution is injected into the vein to close it off.  Laser treatment. A laser is used to heat the vein to close it off.  Radiofrequency vein ablation. An electrical current produced by radio waves is used to close off the vein.  Phlebectomy. The vein is surgically removed through small incisions made over the varicose vein.  Vein ligation and stripping. The vein is surgically removed through incisions made over the varicose vein after the vein has been tied (ligated). HOME CARE INSTRUCTIONS  Do not stand or sit in one position for long periods of time. Do not sit with your legs crossed. Rest with your legs raised during the day.  Wear compression stockings as directed by your health care provider. These stockings help to prevent blood clots and reduce swelling in your legs.  Do not wear other tight, encircling garments around your legs, pelvis, or waist.  Walk as much as possible to increase blood flow.  Raise the foot of your bed at night with 2-inch blocks.  If you get a cut in the  skin over the vein and the vein bleeds, lie down with your leg raised and press on it with a clean cloth until the bleeding stops. Then place a bandage (dressing) on the cut. See your health care provider if it continues to bleed. SEEK MEDICAL CARE IF:  The skin around your ankle starts to break  down.  You have pain, redness, tenderness, or hard swelling in your leg over a vein.  You are uncomfortable because of leg pain.   This information is not intended to replace advice given to you by your health care provider. Make sure you discuss any questions you have with your health care provider.   Document Released: 06/20/2005 Document Revised: 10/01/2014 Document Reviewed: 01/26/2014 Elsevier Interactive Patient Education [employer]

## 2015-10-22 NOTE — ED Notes (Signed)
Per EMS, Pt, from home, c/o BLE pain x 2 weeks.  Pain score 10/10.  Pt reports that she ran out of pain medication x 2 weeks ago.  Pt ambulated on scene w/ a cane.

## 2015-10-22 NOTE — ED Notes (Signed)
[facility] here to transport pt back to her home. 

## 2015-10-25 ENCOUNTER — Emergency Department (HOSPITAL_COMMUNITY)
Admission: EM | Admit: 2015-10-25 | Discharge: 2015-10-25 | Disposition: A | Discharge status: Home / Self Care | Payer: Medicare Other | Attending: Emergency Medicine | Admitting: Emergency Medicine

## 2015-10-25 ENCOUNTER — Encounter (HOSPITAL_COMMUNITY): Payer: Self-pay | Admitting: Emergency Medicine

## 2015-10-25 ENCOUNTER — Emergency Department (HOSPITAL_COMMUNITY): Payer: Medicare Other

## 2015-10-25 DIAGNOSIS — K219 Gastro-esophageal reflux disease without esophagitis: Secondary | ICD-10-CM | POA: Diagnosis not present

## 2015-10-25 DIAGNOSIS — J45909 Unspecified asthma, uncomplicated: Secondary | ICD-10-CM | POA: Insufficient documentation

## 2015-10-25 DIAGNOSIS — R011 Cardiac murmur, unspecified: Secondary | ICD-10-CM | POA: Diagnosis not present

## 2015-10-25 DIAGNOSIS — F329 Major depressive disorder, single episode, unspecified: Secondary | ICD-10-CM | POA: Diagnosis not present

## 2015-10-25 DIAGNOSIS — Z792 Long term (current) use of antibiotics: Secondary | ICD-10-CM | POA: Diagnosis not present

## 2015-10-25 DIAGNOSIS — G4730 Sleep apnea, unspecified: Secondary | ICD-10-CM | POA: Diagnosis not present

## 2015-10-25 DIAGNOSIS — F209 Schizophrenia, unspecified: Secondary | ICD-10-CM | POA: Insufficient documentation

## 2015-10-25 DIAGNOSIS — Z86711 Personal history of pulmonary embolism: Secondary | ICD-10-CM | POA: Diagnosis not present

## 2015-10-25 DIAGNOSIS — I10 Essential (primary) hypertension: Secondary | ICD-10-CM | POA: Insufficient documentation

## 2015-10-25 DIAGNOSIS — E785 Hyperlipidemia, unspecified: Secondary | ICD-10-CM | POA: Diagnosis not present

## 2015-10-25 DIAGNOSIS — I8393 Asymptomatic varicose veins of bilateral lower extremities: Secondary | ICD-10-CM | POA: Insufficient documentation

## 2015-10-25 DIAGNOSIS — Z7951 Long term (current) use of inhaled steroids: Secondary | ICD-10-CM | POA: Insufficient documentation

## 2015-10-25 DIAGNOSIS — Z7901 Long term (current) use of anticoagulants: Secondary | ICD-10-CM | POA: Insufficient documentation

## 2015-10-25 DIAGNOSIS — R0789 Other chest pain: Secondary | ICD-10-CM | POA: Insufficient documentation

## 2015-10-25 DIAGNOSIS — M1990 Unspecified osteoarthritis, unspecified site: Secondary | ICD-10-CM | POA: Insufficient documentation

## 2015-10-25 DIAGNOSIS — Z862 Personal history of diseases of the blood and blood-forming organs and certain disorders involving the immune mechanism: Secondary | ICD-10-CM | POA: Insufficient documentation

## 2015-10-25 DIAGNOSIS — F419 Anxiety disorder, unspecified: Secondary | ICD-10-CM | POA: Diagnosis not present

## 2015-10-25 DIAGNOSIS — Z8701 Personal history of pneumonia (recurrent): Secondary | ICD-10-CM | POA: Diagnosis not present

## 2015-10-25 DIAGNOSIS — G8929 Other chronic pain: Secondary | ICD-10-CM | POA: Diagnosis not present

## 2015-10-25 DIAGNOSIS — Z872 Personal history of diseases of the skin and subcutaneous tissue: Secondary | ICD-10-CM | POA: Insufficient documentation

## 2015-10-25 DIAGNOSIS — R1011 Right upper quadrant pain: Secondary | ICD-10-CM | POA: Diagnosis present

## 2015-10-25 DIAGNOSIS — Z79899 Other long term (current) drug therapy: Secondary | ICD-10-CM | POA: Diagnosis not present

## 2015-10-25 DIAGNOSIS — Z8673 Personal history of transient ischemic attack (TIA), and cerebral infarction without residual deficits: Secondary | ICD-10-CM | POA: Diagnosis not present

## 2015-10-25 LAB — COMPREHENSIVE METABOLIC PANEL
ALT: 17 U/L (ref 14–54)
AST: 19 U/L (ref 15–41)
Albumin: 4.5 g/dL (ref 3.5–5.0)
Alkaline Phosphatase: 104 U/L (ref 38–126)
Anion gap: 12 (ref 5–15)
BUN: 15 mg/dL (ref 6–20)
CALCIUM: 9.9 mg/dL (ref 8.9–10.3)
CO2: 27 mmol/L (ref 22–32)
CREATININE: 1.16 mg/dL — AB (ref 0.44–1.00)
Chloride: 98 mmol/L — ABNORMAL LOW (ref 101–111)
GFR, EST AFRICAN AMERICAN: 53 mL/min — AB (ref >60)
GFR, EST NON AFRICAN AMERICAN: 46 mL/min — AB (ref >60)
GLUCOSE: 105 mg/dL — AB (ref 65–99)
Potassium: 3.6 mmol/L (ref 3.5–5.1)
SODIUM: 137 mmol/L (ref 135–145)
TOTAL PROTEIN: 8.7 g/dL — AB (ref 6.5–8.1)
Total Bilirubin: 0.5 mg/dL (ref 0.3–1.2)

## 2015-10-25 LAB — CBC WITH DIFFERENTIAL/PLATELET
Basophils Absolute: 0 10*3/uL (ref 0.0–0.1)
Basophils Relative: 0 %
Eosinophils Absolute: 0.1 10*3/uL (ref 0.0–0.7)
Eosinophils Relative: 1 %
HEMATOCRIT: 37.3 % (ref 36.0–46.0)
HEMOGLOBIN: 12.5 g/dL (ref 12.0–15.0)
LYMPHS ABS: 1.4 10*3/uL (ref 0.7–4.0)
Lymphocytes Relative: 17 %
MCH: 30 pg (ref 26.0–34.0)
MCHC: 33.5 g/dL (ref 30.0–36.0)
MCV: 89.4 fL (ref 78.0–100.0)
MONOS PCT: 6 %
Monocytes Absolute: 0.5 10*3/uL (ref 0.1–1.0)
NEUTROS ABS: 6.4 10*3/uL (ref 1.7–7.7)
NEUTROS PCT: 76 %
Platelets: 257 10*3/uL (ref 150–400)
RBC: 4.17 MIL/uL (ref 3.87–5.11)
RDW: 15.2 % (ref 11.5–15.5)
WBC: 8.4 10*3/uL (ref 4.0–10.5)

## 2015-10-25 LAB — PROTIME-INR
INR: 1.05 (ref 0.00–1.49)
Prothrombin Time: 13.9 seconds (ref 11.6–15.2)

## 2015-10-25 LAB — I-STAT TROPONIN, ED: Troponin i, poc: 0.01 ng/mL (ref 0.00–0.08)

## 2015-10-25 LAB — LIPASE, BLOOD: Lipase: 18 U/L (ref 11–51)

## 2015-10-25 MED ORDER — GI COCKTAIL ~~LOC~~
30.0000 mL | Freq: Once | ORAL | Status: AC
  Administered 2015-10-25: 30 mL via ORAL
  Filled 2015-10-25: qty 30

## 2015-10-25 NOTE — ED Notes (Signed)
Pt yelling in hall about race and we aren't treating her right and that the officer is sticking up for white people.

## 2015-10-25 NOTE — ED Notes (Signed)
Pt back from x-ray.

## 2015-10-25 NOTE — Discharge Instructions (Signed)
You were seen in the emergency room today for evaluation of right rib/abdominal pain. Your labs and x-ray were normal. Your pain appears chronic. You may take tylenol as needed for your pain. Please follow up with your primary care provider this week for follow up evaluation. Return to the ER for new or worsening symptoms.

## 2015-10-25 NOTE — ED Notes (Signed)
I am unable to assess any issue with this patient.  Patient continually diverts any questions About medical issues.  Patient is not showing any signs of distress at this time.

## 2015-10-25 NOTE — ED Provider Notes (Signed)
CSN: [id number]     Arrival date & time 10/25/15  1331 History   First MD Initiated Contact with Patient 10/25/15 1503     Chief Complaint  Patient presents with  . Abdominal Pain     HPI  Ms. Tondreau is an 75 y.o. female with history of schizophrenia, HTN, HLD, chronic pain, varicose veins, h/o PE on Coumadin who presents to the ED for evaluation of right rib pain. She states she has had pain her spine and ribs for the past few months. She points to her RUQ to indicate where her pain is. She states her ribs feel "sunken in." She states she has been to [facility] and [facility] many times and the doctors never "treat her right" and can never figure out her pain. She also has tangential thought and frequently starts talking about the KKK and how "the white doctors don't listen." She is oriented to person and place but states the year is 2010 or 2012. Denies chest pain, SOB, n/v/d. She denies injury or trauma. She states she has been out of her Coumadin for four days but picked up her refill today. She is requesting a chest and rib x-ray today. Pt states she lives at home with her family. Social work/case management and ED staff very familiar with pt and report she is here frequently with multiple complaints of pain. State she does not qualify for any kind of home health services and has transportation assistance.  Past Medical History  Diagnosis Date  . Depression   . Schizophrenia (HCC)   . Hyperlipidemia   . Hypertension   . Chronic pain     "over my whole body" (03/30/2013)  . Pulmonary embolism (HCC) 12/2009    Large central bilateral PE's   . DVT (deep venous thrombosis) (HCC)     per 01/17/10 d/c summary- "remote hx of dvt"?  . Iron deficiency anemia   . Glaucoma   . Hemorrhoids   . Asthma   . Anxiety   . GERD (gastroesophageal reflux disease)   . Psoriasis 04/29/2012    New onset evaluated by Dr Marylou Flesher, Dermatology, [facility] 6/13  Rx 0.1% Tacrolimus ointment  . Complication of  anesthesia     "because I have sleep apnea" (03/30/2013)  . Heart murmur   . Chest pain, exertional   . Chronic bronchitis (HCC)   . Sleep apnea     "waiting on my CPAP" (03/30/2013)  . Pneumonia     "a few times" (03/30/2013)  . Exertional shortness of breath     "& sometimes when laying down" (03/30/2013)  . Daily headache     "last 2 months" (03/30/2013)  . Arthritis     "joints" (03/30/2013)  . Varicose veins of legs   . Psoriasis    Past Surgical History  Procedure Laterality Date  . Spinal fusion      2004  . Carpal tunnel release Bilateral   . Tubal ligation  1973  . Dilation and curettage of uterus  1970's    "once" (03/30/2013)  . 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 Joint replacement    . Vein surgery  left leg   Family History  Problem Relation Age of Onset  . Diabetes Sister   . Colon cancer Brother 40  . Colon cancer Brother    Social History  Substance Use Topics  . Smoking status: Never Smoker   . Smokeless tobacco: Never Used  . Alcohol Use: No   OB  History    No data available     Review of Systems  All other systems reviewed and are negative.     Allergies  Review of patient's allergies indicates no known allergies.  Home Medications   Prior to Admission medications   Medication Sig Start Date End Date Taking? Authorizing Provider  atenolol (TENORMIN) 25 MG tablet Take 25 mg by mouth daily.   Yes Historical Provider, MD  atorvastatin (LIPITOR) 10 MG tablet Take 10 mg by mouth at bedtime.   Yes Historical Provider, MD  busPIRone (BUSPAR) 7.5 MG tablet Take 1 tablet (7.5 mg total) by mouth 2 (two) times daily. 10/02/15  Yes Charm Rings, NP  esomeprazole (NEXIUM) 40 MG capsule Take 1 capsule (40 mg total) by mouth daily. 09/07/15  Yes Roxy Horseman, PA-C  FLUoxetine (PROZAC) 40 MG capsule Take 1 capsule (40 mg total) by mouth daily. 10/02/15  Yes Charm Rings, NP  furosemide (LASIX) 20 MG tablet Take 1 tablet (20 mg total) by mouth daily. 10/29/14   Yes Tyrone Nine, MD  hydrOXYzine (VISTARIL) 25 MG capsule Take 1 capsule (25 mg total) by mouth daily as needed for anxiety. 10/02/15  Yes Charm Rings, NP  potassium chloride SA (K-DUR,KLOR-CON) 20 MEQ tablet Take 20 mEq by mouth daily.   Yes Historical Provider, MD  silver sulfADIAZINE (SILVADENE) 1 % cream APPLY TOPICALLY EVERY DAY 12/10/14  Yes Tyrone Nine, MD  traZODone (DESYREL) 100 MG tablet Take 1 tablet (100 mg total) by mouth at bedtime. 10/02/15  Yes Charm Rings, NP  warfarin (COUMADIN) 5 MG tablet TAKE 1 TABLET BY MOUTH EVERY DAY Patient taking differently: TAKE 1.5  TABLETS (7.5 mg)  BY MOUTH EVERY DAY 10/18/14  Yes Levert Feinstein, MD  acetaminophen (TYLENOL) 500 MG tablet Take 1 tablet (500 mg total) by mouth every 6 (six) hours as needed. Patient taking differently: Take 500 mg by mouth every 6 (six) hours as needed for mild pain.  08/28/15   Cheri Fowler, PA-C  albuterol (PROVENTIL HFA;VENTOLIN HFA) 108 (90 BASE) MCG/ACT inhaler Inhale 2 puffs into the lungs every 6 (six) hours as needed for wheezing or shortness of breath (wheezing). 12/08/14   Tyrone Nine, MD  amLODipine (NORVASC) 10 MG tablet Take 10 mg by mouth daily.    Historical Provider, MD  cephALEXin (KEFLEX) 500 MG capsule Take 1 capsule (500 mg total) by mouth every 6 (six) hours. 09/26/15   Charm Rings, NP  cyclobenzaprine (FLEXERIL) 10 MG tablet Take 1 tablet (10 mg total) by mouth 2 (two) times daily as needed for muscle spasms. 08/20/15   Elpidio Anis, PA-C  fluticasone (FLOVENT HFA) 220 MCG/ACT inhaler Inhale 2 puffs into the lungs daily.  09/16/15   Historical Provider, MD  Fluticasone Propionate, Inhal, (FLOVENT DISKUS) 250 MCG/BLIST AEPB Inhale 2 puffs into the lungs every 4 (four) hours as needed (for shortness of breath and coughing).    Historical Provider, MD  gabapentin (NEURONTIN) 300 MG capsule Take 1 capsule (300 mg total) by mouth 3 (three) times daily. 10/02/15   Charm Rings, NP  meclizine  (ANTIVERT) 25 MG tablet Take 1 tablet (25 mg total) by mouth 3 (three) times daily as needed for dizziness. 02/01/15   Arby Barrette, MD  mometasone [facility]) 220 MCG/INH inhaler Inhale 2 puffs into the lungs daily.    Historical Provider, MD  traMADol (ULTRAM) 50 MG tablet Take 50 mg by mouth 3 (three) times daily.  Historical Provider, MD  triamcinolone cream (KENALOG) 0.1 % Apply 1 application topically 2 (two) times daily as needed (for legs). 10/29/14   Tyrone Nine, MD   BP 136/83 mmHg  Pulse 75  Temp(Src) 98.4 F (36.9 C) (Oral)  Resp 16  SpO2 93%  LMP 11/22/1996 Physical Exam  HENT:  Right Ear: External ear normal.  Left Ear: External ear normal.  Nose: Nose normal.  Mouth/Throat: Oropharynx is clear and moist. No oropharyngeal exudate.  Eyes: Conjunctivae and EOM are normal. Pupils are equal, round, and reactive to light.  Neck: Normal range of motion. Neck supple.  Cardiovascular: Normal rate, regular rhythm, normal heart sounds and intact distal pulses.   Pulmonary/Chest: Effort normal and breath sounds normal. No respiratory distress. She has no wheezes. She exhibits no tenderness.  Abdominal: Soft. Bowel sounds are normal. She exhibits no distension. There is no tenderness. There is no rebound and no guarding.  Musculoskeletal: She exhibits no edema.  Neurological: She is alert. No cranial nerve deficit.  Skin: Skin is warm and dry.  Bilateral LE varicose veins and chronic skin changes. No edema.  Psychiatric: She has a normal mood and affect.  Nursing note and vitals reviewed.   ED Course  Procedures (including critical care time) Labs Review Labs Reviewed  COMPREHENSIVE METABOLIC PANEL - Abnormal; Notable for the following:    Chloride 98 (*)    Glucose, Bld 105 (*)    Creatinine, Ser 1.16 (*)    Total Protein 8.7 (*)    GFR calc non Af Amer 46 (*)    GFR calc Af Amer 53 (*)    All other components within normal limits  CBC WITH DIFFERENTIAL/PLATELET   LIPASE, BLOOD  PROTIME-INR  I-STAT TROPOININ, ED    Imaging Review Dg Abd Acute W/chest  10/25/2015  CLINICAL DATA:  Abdominal pain, bilateral rib pain for 3 months, right upper quadrant pain EXAM: DG ABDOMEN ACUTE W/ 1V CHEST COMPARISON:  10/01/2015 FINDINGS: Cardiomegaly again noted. No acute infiltrate or pleural effusion. No pulmonary edema. There is no evidence of free abdominal air. Metallic fixation material noted lumbar spine L4-L5 level. Moderate colonic gas and stool noted in right colon. Normal small bowel gas pattern. IMPRESSION: No acute disease within chest. Moderate stool and gas noted within right colon. Postsurgical changes lumbar spine L4-L5 level. No free abdominal air. Electronically Signed   By: Natasha Mead M.D.   On: 10/25/2015 16:23   I have personally reviewed and evaluated these images and lab results as part of my medical decision-making.   EKG Interpretation None      MDM   Final diagnoses:  Right-sided chest wall pain    Pt is an 75 y.o. female with history of schizophrenia, multiple recent ED visits for various pain-related complaints. She has some disorientation to year and tangential thought, some paranoia, but denies SI/HI and is otherwise acting appropriately. She is well known to case management and social work and we can offer no more assistance today. Her exam is completely nonfocal. VSS. Her labs are unremarkable. CXR and abd XR obtained per pt request and they are unremarkable. Discussed findings with pt. Attending Dr. Adela Lank also saw and evaluated pt. Pt is stable for discharge. Instructed to f/u with PCP. Instructed to take Coumadin as prescribed as she is subtherapeutic today though she has been out of it for four days. ER return precautions given.    Carlene Coria, PA-C 10/25/15 2106  Melene Plan, DO 10/25/15 2157  Melene Plan, DO 10/25/15 2158

## 2015-10-25 NOTE — ED Notes (Signed)
Bed: WA01 Expected date:  Expected time:  Means of arrival:  Comments: EMS- 74yo M, abdominal and rib pain x months

## 2015-10-25 NOTE — ED Notes (Signed)
Pt from home. Per EMS. Pt reports abd and bilateral rib pain for the past 3 months. Also reports HA. Pt told EMS her husband was recently diagnosed with a stroke and as a result family does not visit her as much. Pt told EMS her daughter has not brought her food or picked up her prescription for her blood thinner for the past week. Pt ambulatory with a walker.

## 2015-10-25 NOTE — Progress Notes (Signed)
united health care medicare & medicaid of [place] covered pt residing in [place] with [facility] ED visits and no admissions pcp listed as [facility]  Pt has been seen by this ED CM previously Pt was found not to be a home health candidate related not being home bound and able to complete her own ADLs Pt was previously with frequent complaints of housing/eviction concerns C/o abdominal and rib pain today This pt is covered by united health care medicare & medicaid of [place], therefore, does not meet any criteria for [facility] medication assistance programs

## 2015-11-23 ENCOUNTER — Telehealth: Payer: Self-pay | Admitting: *Deleted

## 2015-11-23 NOTE — Telephone Encounter (Signed)
Dr. Kyung Rudd called to request medical notes related to diagnosis of PE from 2011.  Records review and faxed to Dr. Sondra Come from 2011.  Patient could not remember if and when she was diagnosed with PE. She received notes from Dr. Jarvis Newcomer, however it did not have anything about a history of PE.  Clovis Pu, RN

## 2016-10-23 DIAGNOSIS — I10 Essential (primary) hypertension: Secondary | ICD-10-CM

## 2016-10-23 DIAGNOSIS — F419 Anxiety disorder, unspecified: Secondary | ICD-10-CM | POA: Diagnosis not present

## 2016-10-23 DIAGNOSIS — J189 Pneumonia, unspecified organism: Secondary | ICD-10-CM

## 2016-10-23 DIAGNOSIS — E669 Obesity, unspecified: Secondary | ICD-10-CM

## 2016-10-23 DIAGNOSIS — J9601 Acute respiratory failure with hypoxia: Secondary | ICD-10-CM

## 2016-10-24 DIAGNOSIS — E669 Obesity, unspecified: Secondary | ICD-10-CM

## 2016-10-24 DIAGNOSIS — J9601 Acute respiratory failure with hypoxia: Secondary | ICD-10-CM

## 2016-10-24 DIAGNOSIS — I2699 Other pulmonary embolism without acute cor pulmonale: Secondary | ICD-10-CM | POA: Diagnosis not present

## 2016-10-24 DIAGNOSIS — J189 Pneumonia, unspecified organism: Secondary | ICD-10-CM | POA: Diagnosis not present

## 2016-10-24 DIAGNOSIS — I10 Essential (primary) hypertension: Secondary | ICD-10-CM

## 2016-10-25 DIAGNOSIS — I10 Essential (primary) hypertension: Secondary | ICD-10-CM | POA: Diagnosis not present

## 2016-10-25 DIAGNOSIS — J189 Pneumonia, unspecified organism: Secondary | ICD-10-CM | POA: Diagnosis not present

## 2016-10-25 DIAGNOSIS — J9601 Acute respiratory failure with hypoxia: Secondary | ICD-10-CM | POA: Diagnosis not present

## 2016-10-25 DIAGNOSIS — I2699 Other pulmonary embolism without acute cor pulmonale: Secondary | ICD-10-CM | POA: Diagnosis not present

## 2016-10-26 DIAGNOSIS — J189 Pneumonia, unspecified organism: Secondary | ICD-10-CM | POA: Diagnosis not present

## 2016-10-26 DIAGNOSIS — I2699 Other pulmonary embolism without acute cor pulmonale: Secondary | ICD-10-CM | POA: Diagnosis not present

## 2016-10-26 DIAGNOSIS — I10 Essential (primary) hypertension: Secondary | ICD-10-CM | POA: Diagnosis not present

## 2016-10-26 DIAGNOSIS — J9601 Acute respiratory failure with hypoxia: Secondary | ICD-10-CM | POA: Diagnosis not present

## 2016-10-30 DIAGNOSIS — I2699 Other pulmonary embolism without acute cor pulmonale: Secondary | ICD-10-CM | POA: Diagnosis not present

## 2016-10-30 DIAGNOSIS — J189 Pneumonia, unspecified organism: Secondary | ICD-10-CM | POA: Diagnosis not present

## 2016-10-30 DIAGNOSIS — I10 Essential (primary) hypertension: Secondary | ICD-10-CM | POA: Diagnosis not present

## 2016-10-30 DIAGNOSIS — J9601 Acute respiratory failure with hypoxia: Secondary | ICD-10-CM | POA: Diagnosis not present

## 2017-08-10 IMAGING — CT CT HEAD W/O CM
2 series · 15 of 30 positions shown, 17 images · non-contrast
Comparison: 2 days ago

CLINICAL DATA: Fall with left hip pain. Initial encounter.

EXAM:
CT HEAD WITHOUT CONTRAST
TECHNIQUE: Contiguous axial images were obtained from the base of the skull
through the vertex without intravenous contrast.

[Series 2: head without · axial · non-contrast · 0.44mm/px · z∈[-155,-45]mm · 7 of 30 slices shown, 9 images]
[im 4/30  brain]
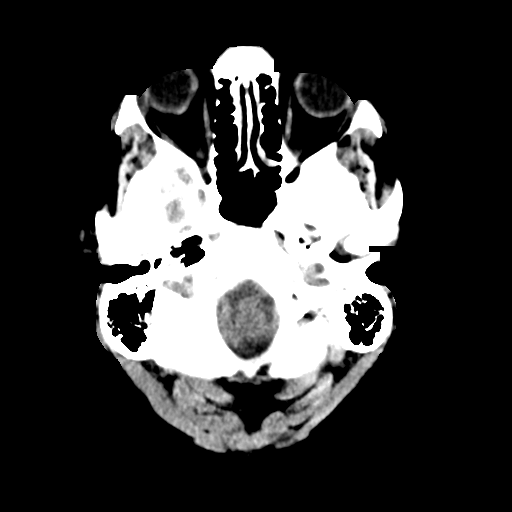
[im 4/30  bone]
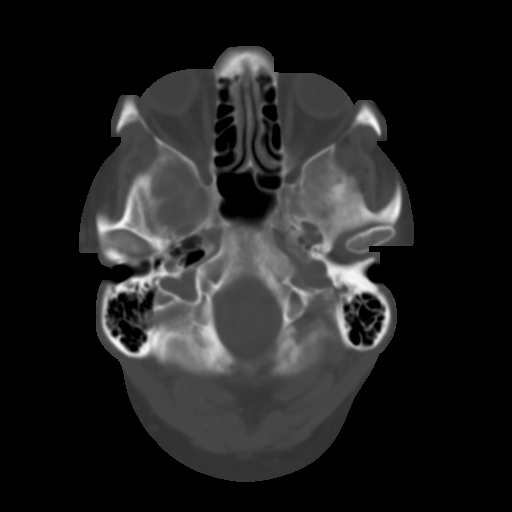
[im 8/30  brain]
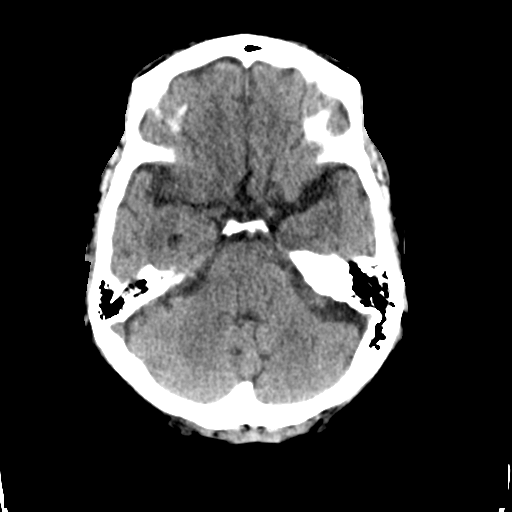
[im 11/30  brain]
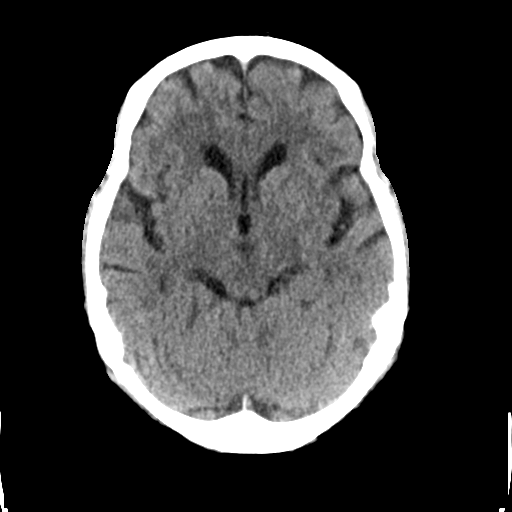
[im 15/30  brain]
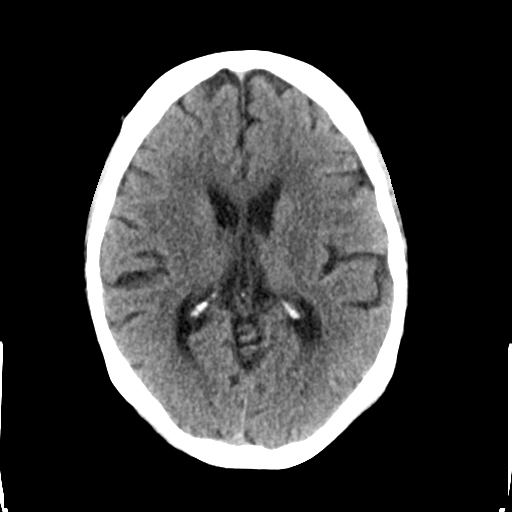
[im 19/30  brain]
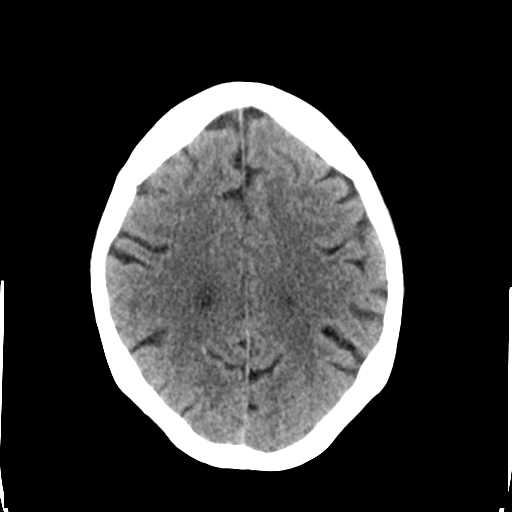
[im 19/30  bone]
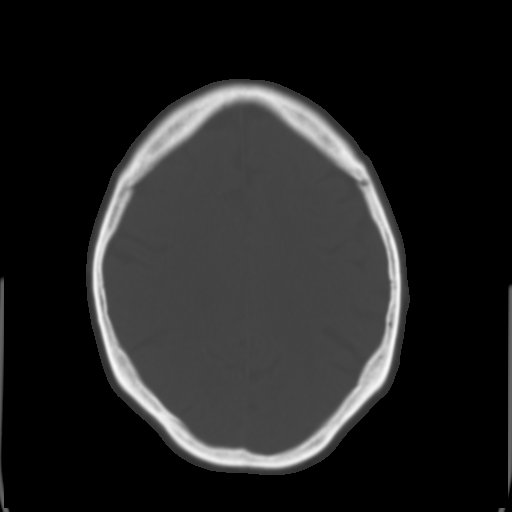
[im 22/30  brain]
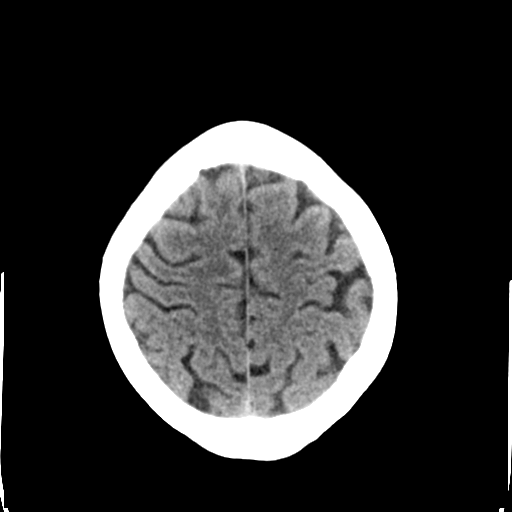
[im 26/30  brain]
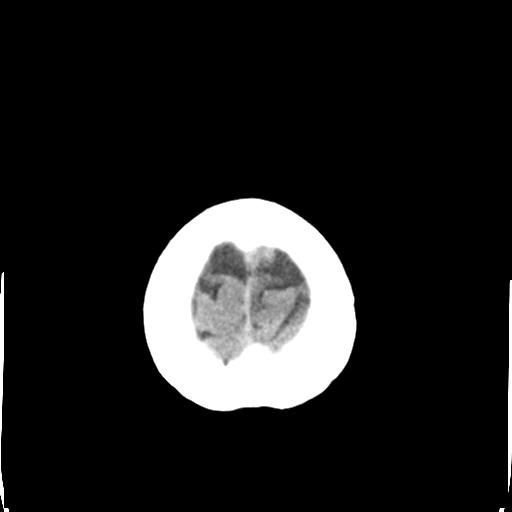

[Series 3: head bone · axial · 0.44mm/px · z∈[-156,-38]mm · 8 of 75 slices shown]
[im 8/75  bone]
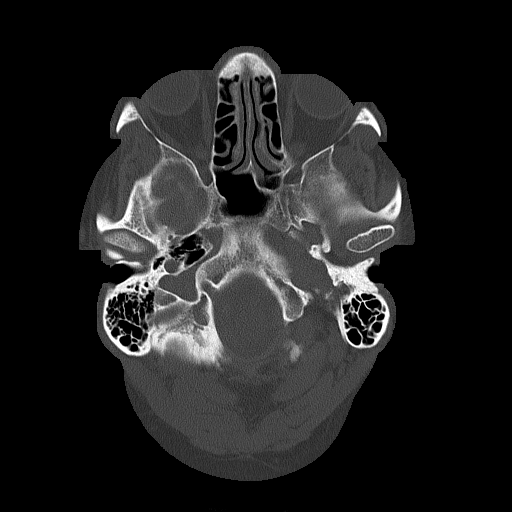
[im 15/75  bone]
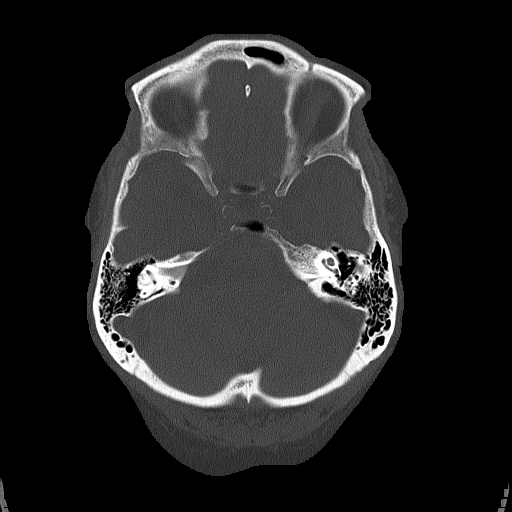
[im 23/75  bone]
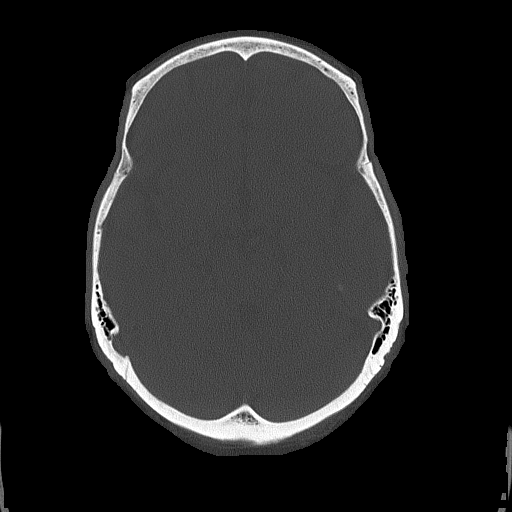
[im 34/75  bone]
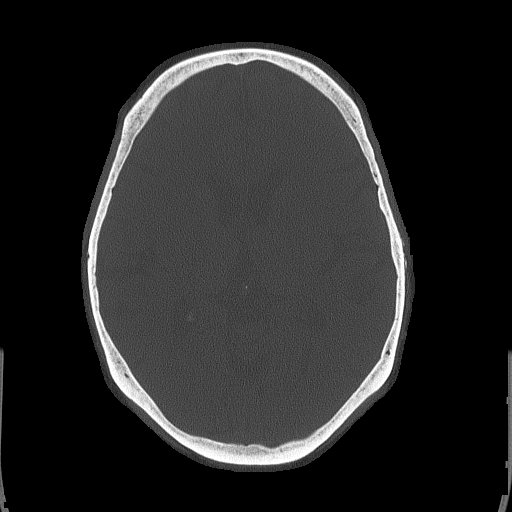
[im 41/75  bone]
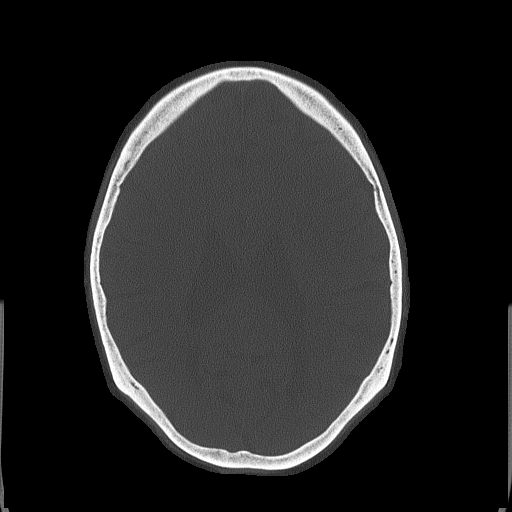
[im 52/75  bone]
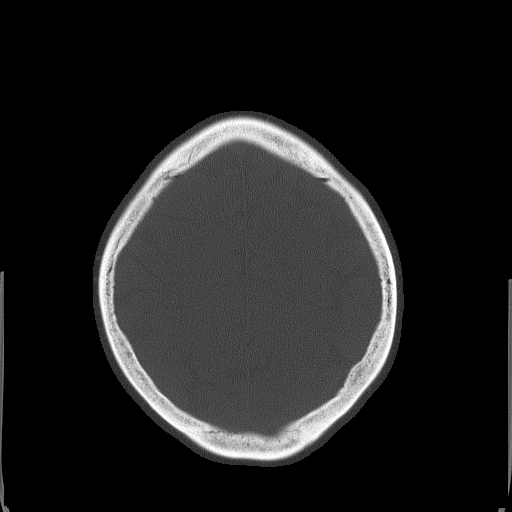
[im 60/75  bone]
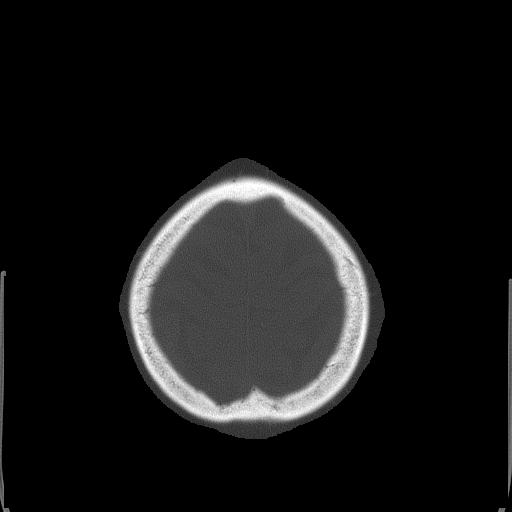
[im 67/75  bone]
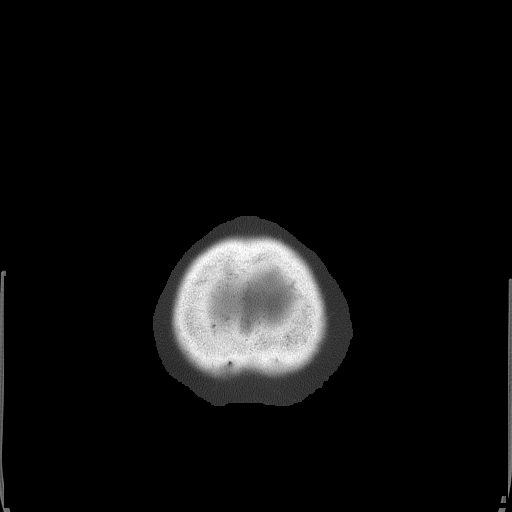

[15 of 30 positions shown; findings below may reference images not displayed]

FINDINGS: Skull and Sinuses:Prominent predental space is chronic when compared
to cervical spine CT 06/12/2015. No acute fracture.

Orbits: Negative.

Brain: No evidence of acute infarction, hemorrhage, hydrocephalus,
or mass lesion/mass effect. No brain swelling or shift. Normal
cerebral volume and white matter for age.
IMPRESSION: No intracranial injury or fracture.

## 2017-10-13 IMAGING — CR DG CHEST 2V
2 series · 2 of 2 positions shown · non-contrast
Comparison: 08/02/2015

CLINICAL DATA: Feeling of impending doom. History of hypertension,
pulmonary embolus, asthma.

EXAM:
CHEST  2 VIEW

[w chest lat]
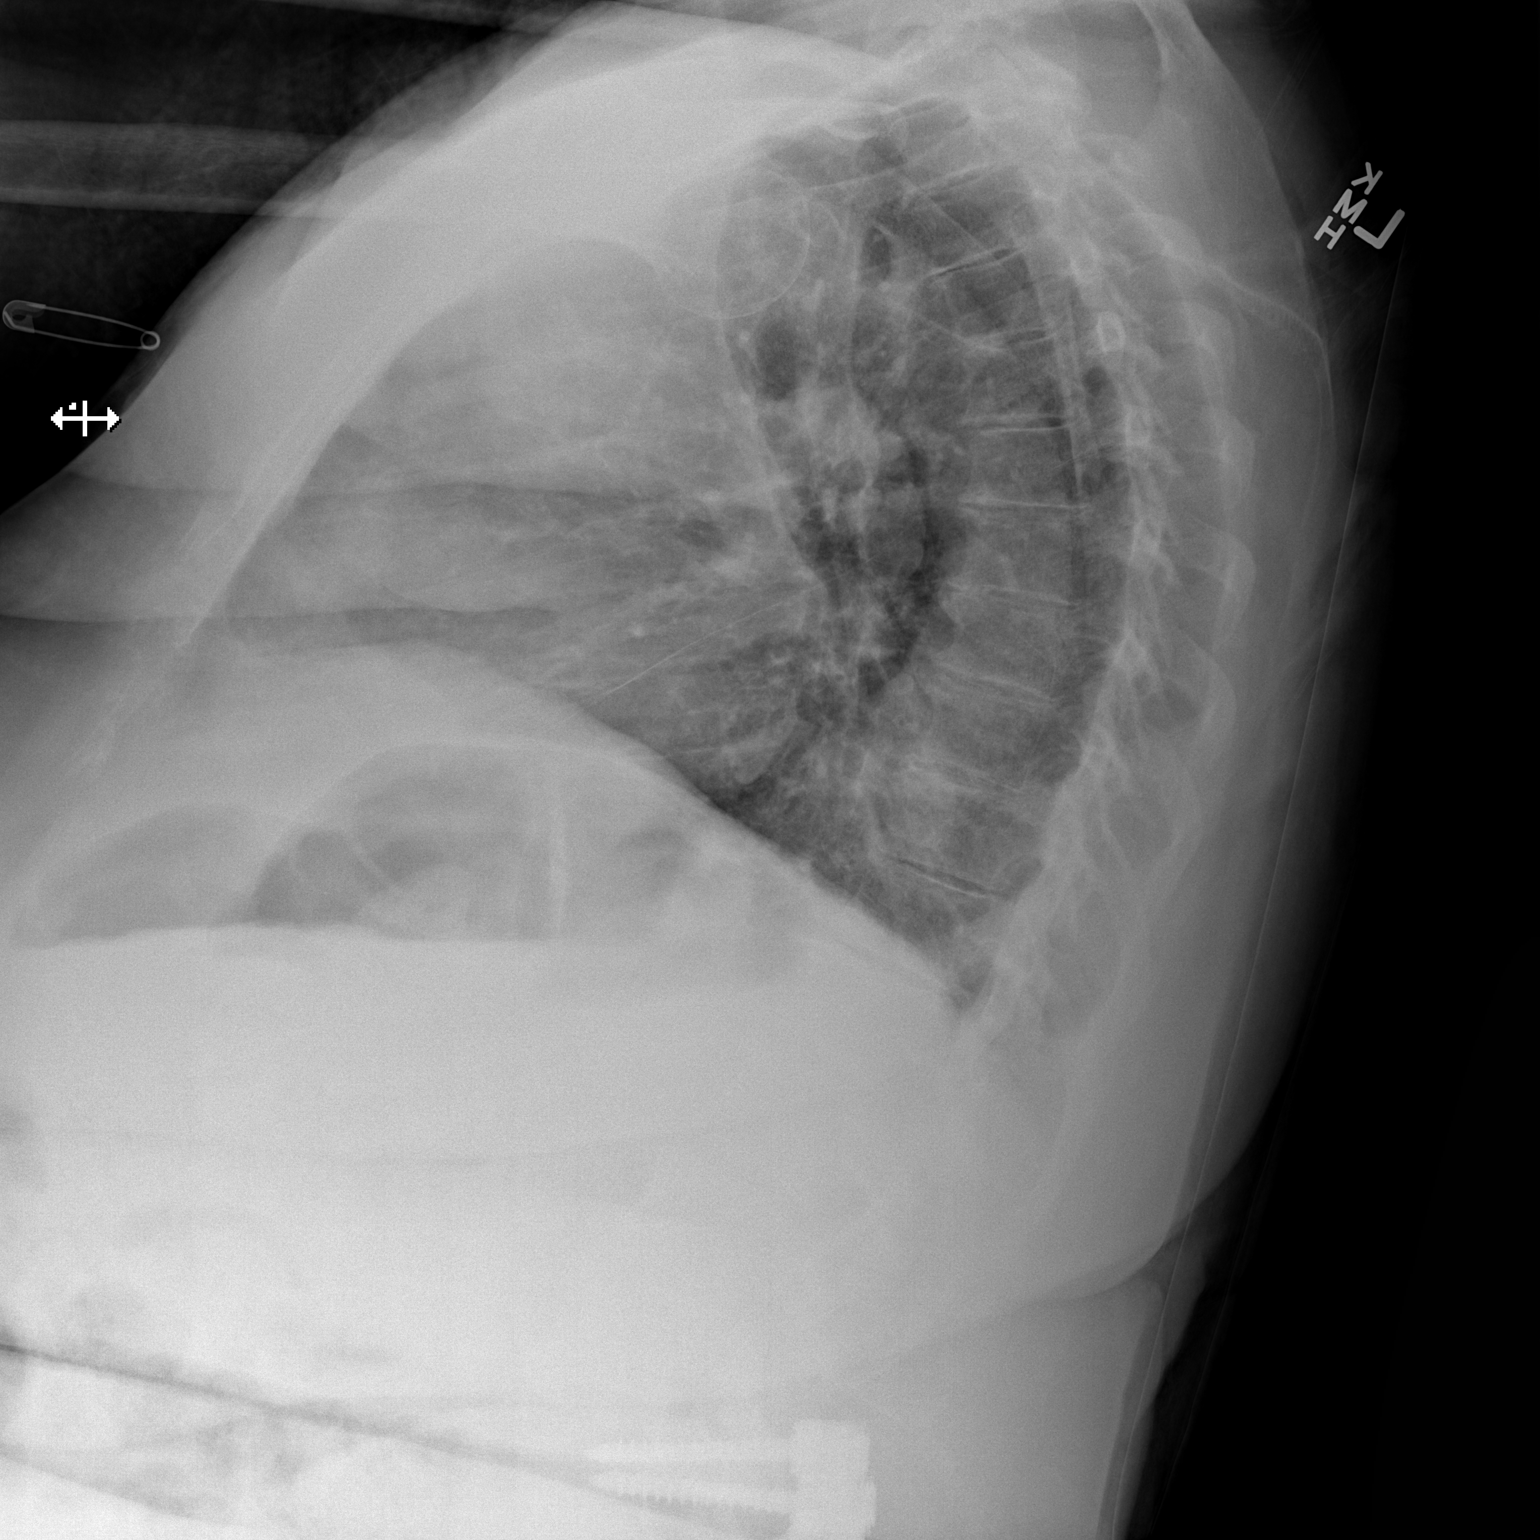

[x chest ap]
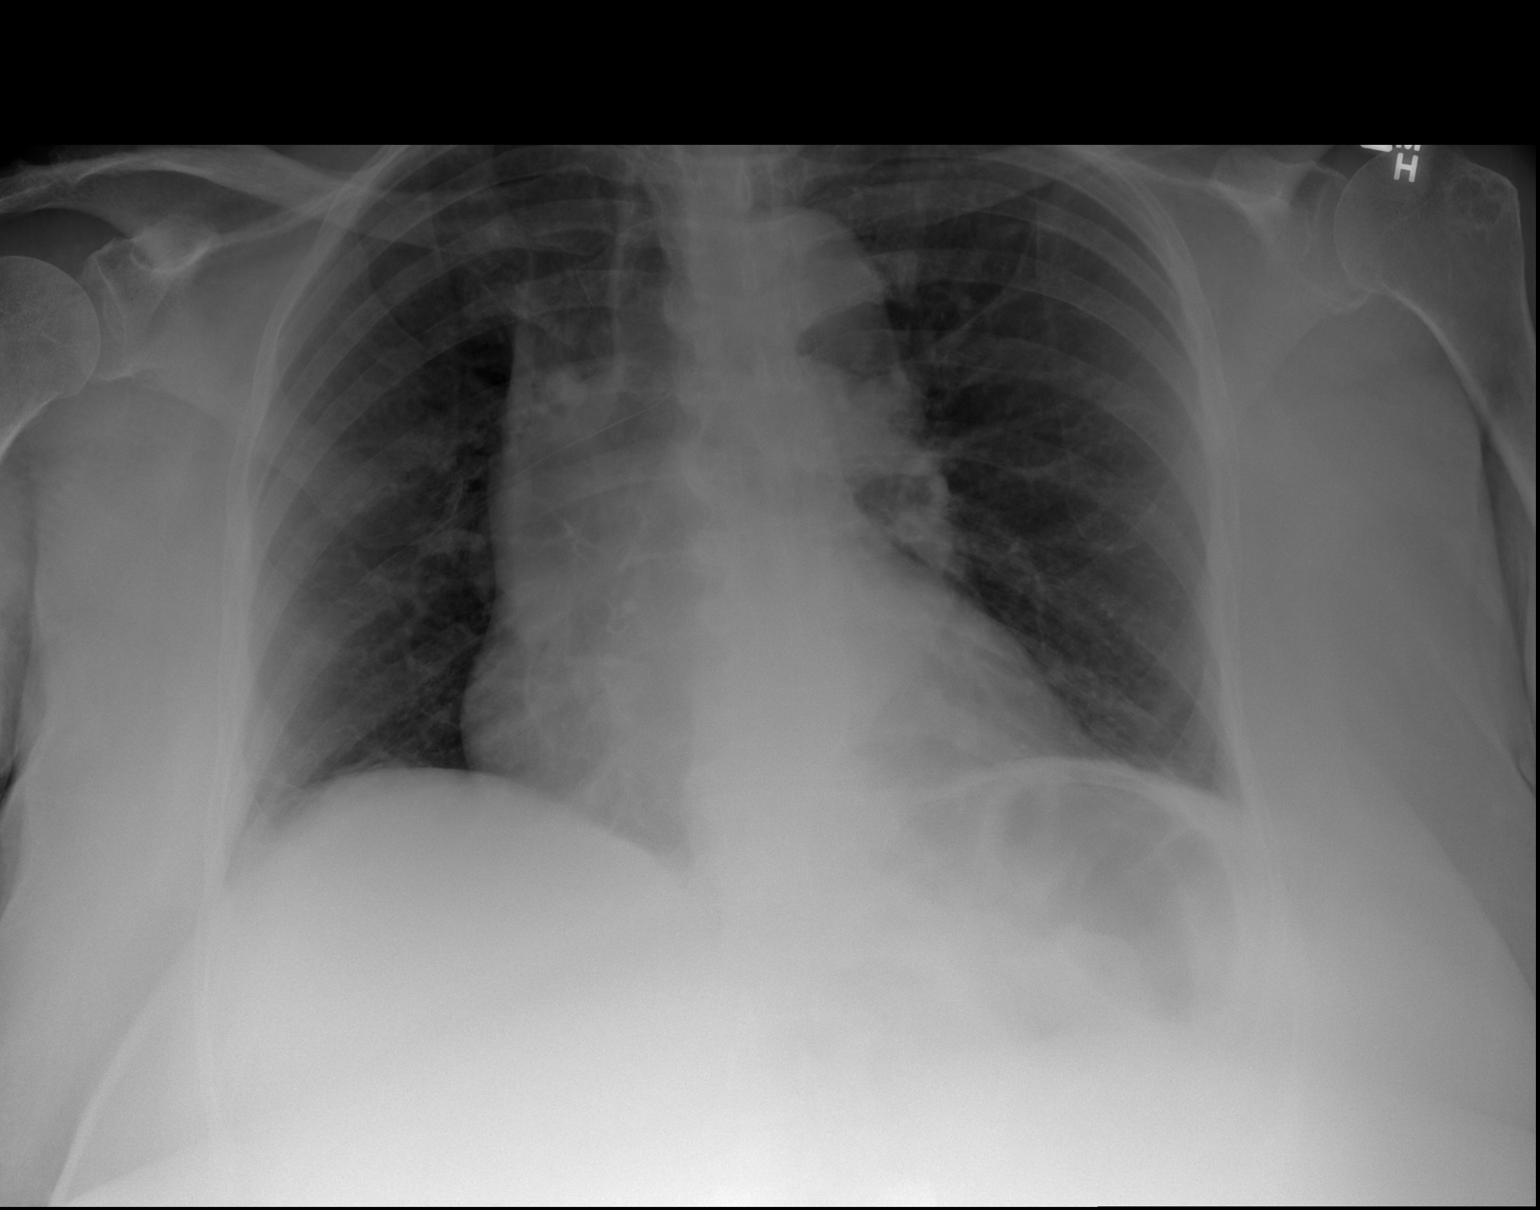

[2 of 2 positions shown; findings below may reference images not displayed]

FINDINGS: Cardiomediastinal silhouette is normal. Mediastinal contours appear
intact.

There is no evidence of focal airspace consolidation, pleural
effusion or pneumothorax. Lungs are with low volume.

Osseous structures are without acute abnormality. Soft tissues are
grossly normal.
IMPRESSION: Low lung volumes otherwise no evidence of acute cardiopulmonary
disease.

## 2017-10-25 IMAGING — CR DG CHEST 2V
2 series · 2 of 2 positions shown · non-contrast
Comparison: Chest radiograph from 08/27/2015

CLINICAL DATA: Medical clearance. Acute onset of dry cough. Initial
encounter.

EXAM:
CHEST  2 VIEW

[w chest pa]
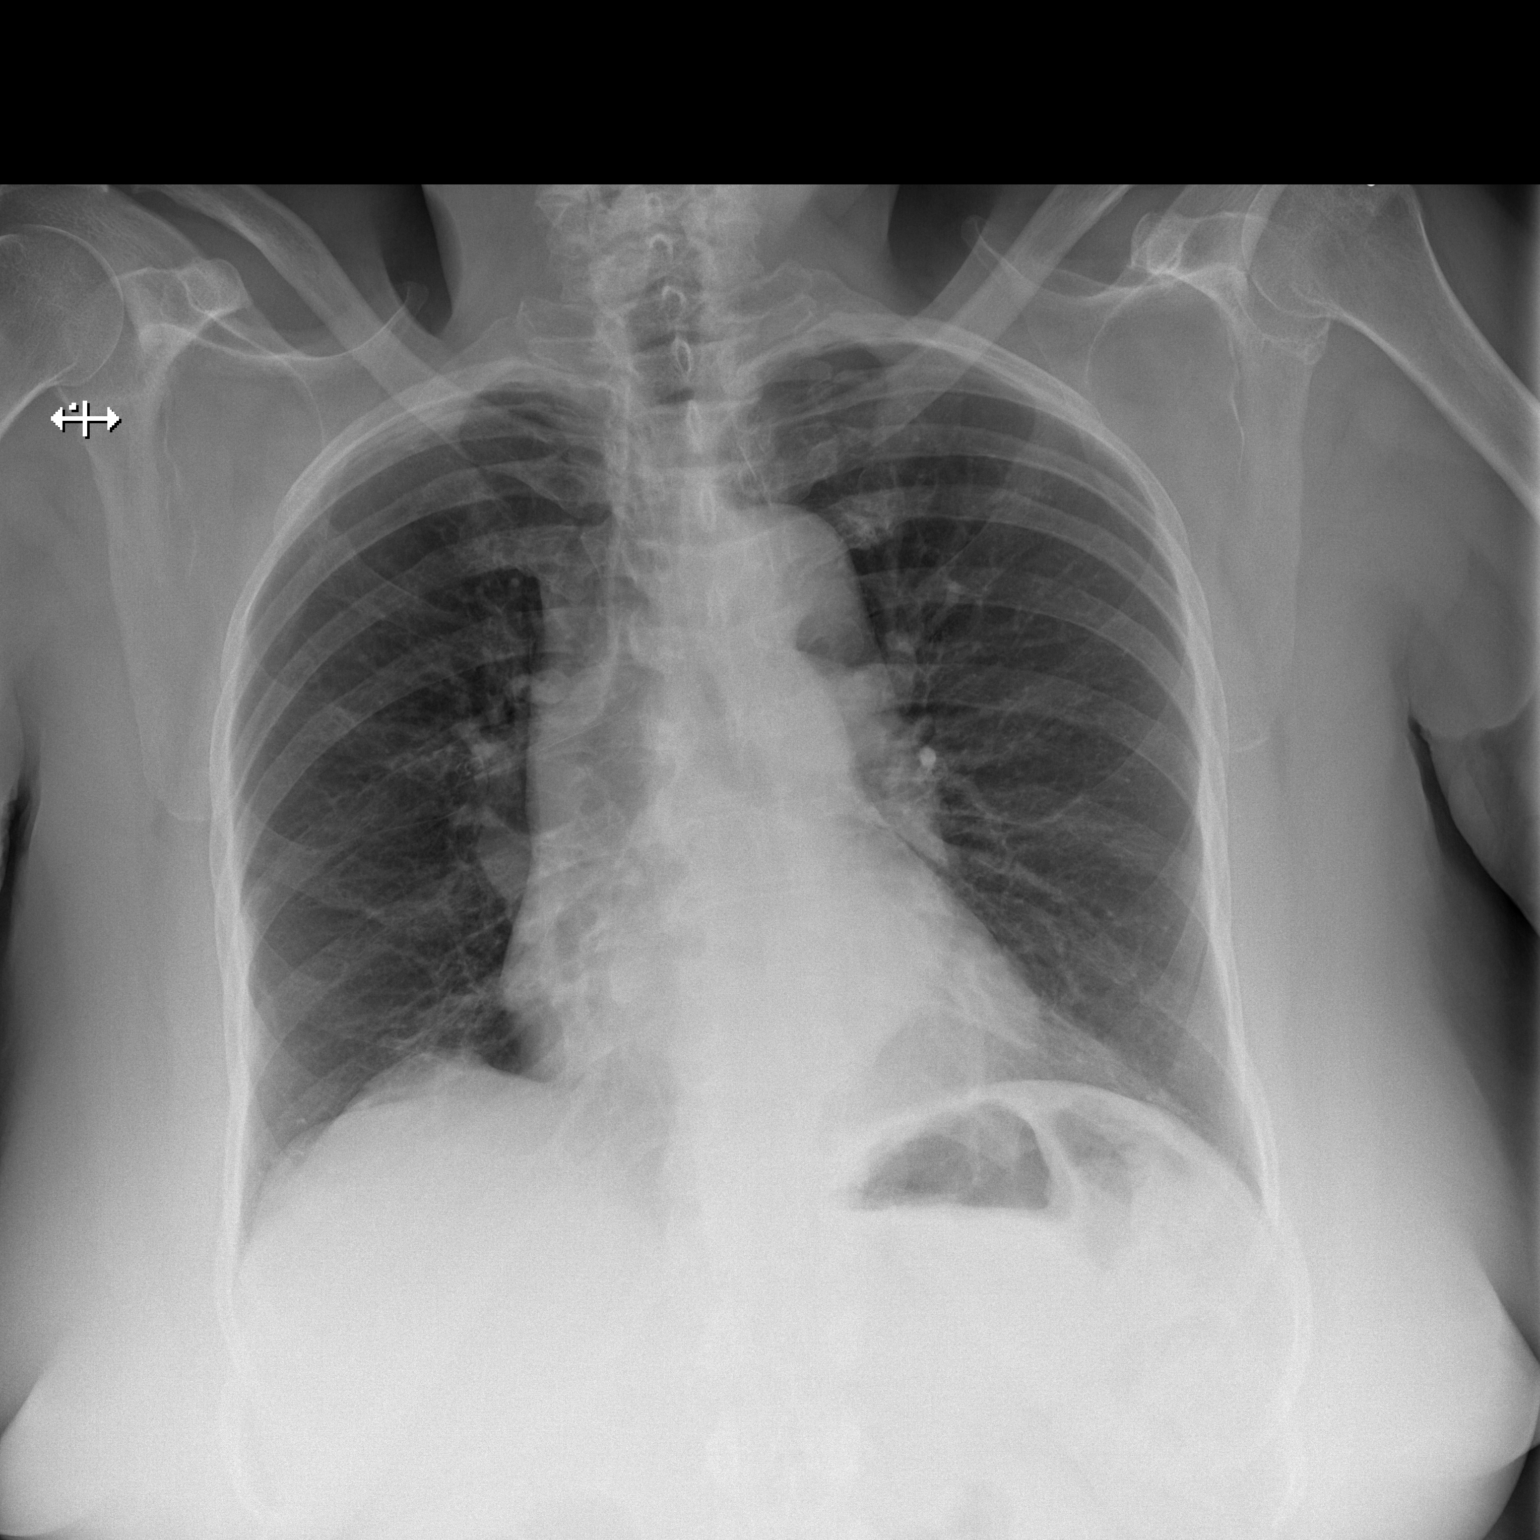

[w chest lat]
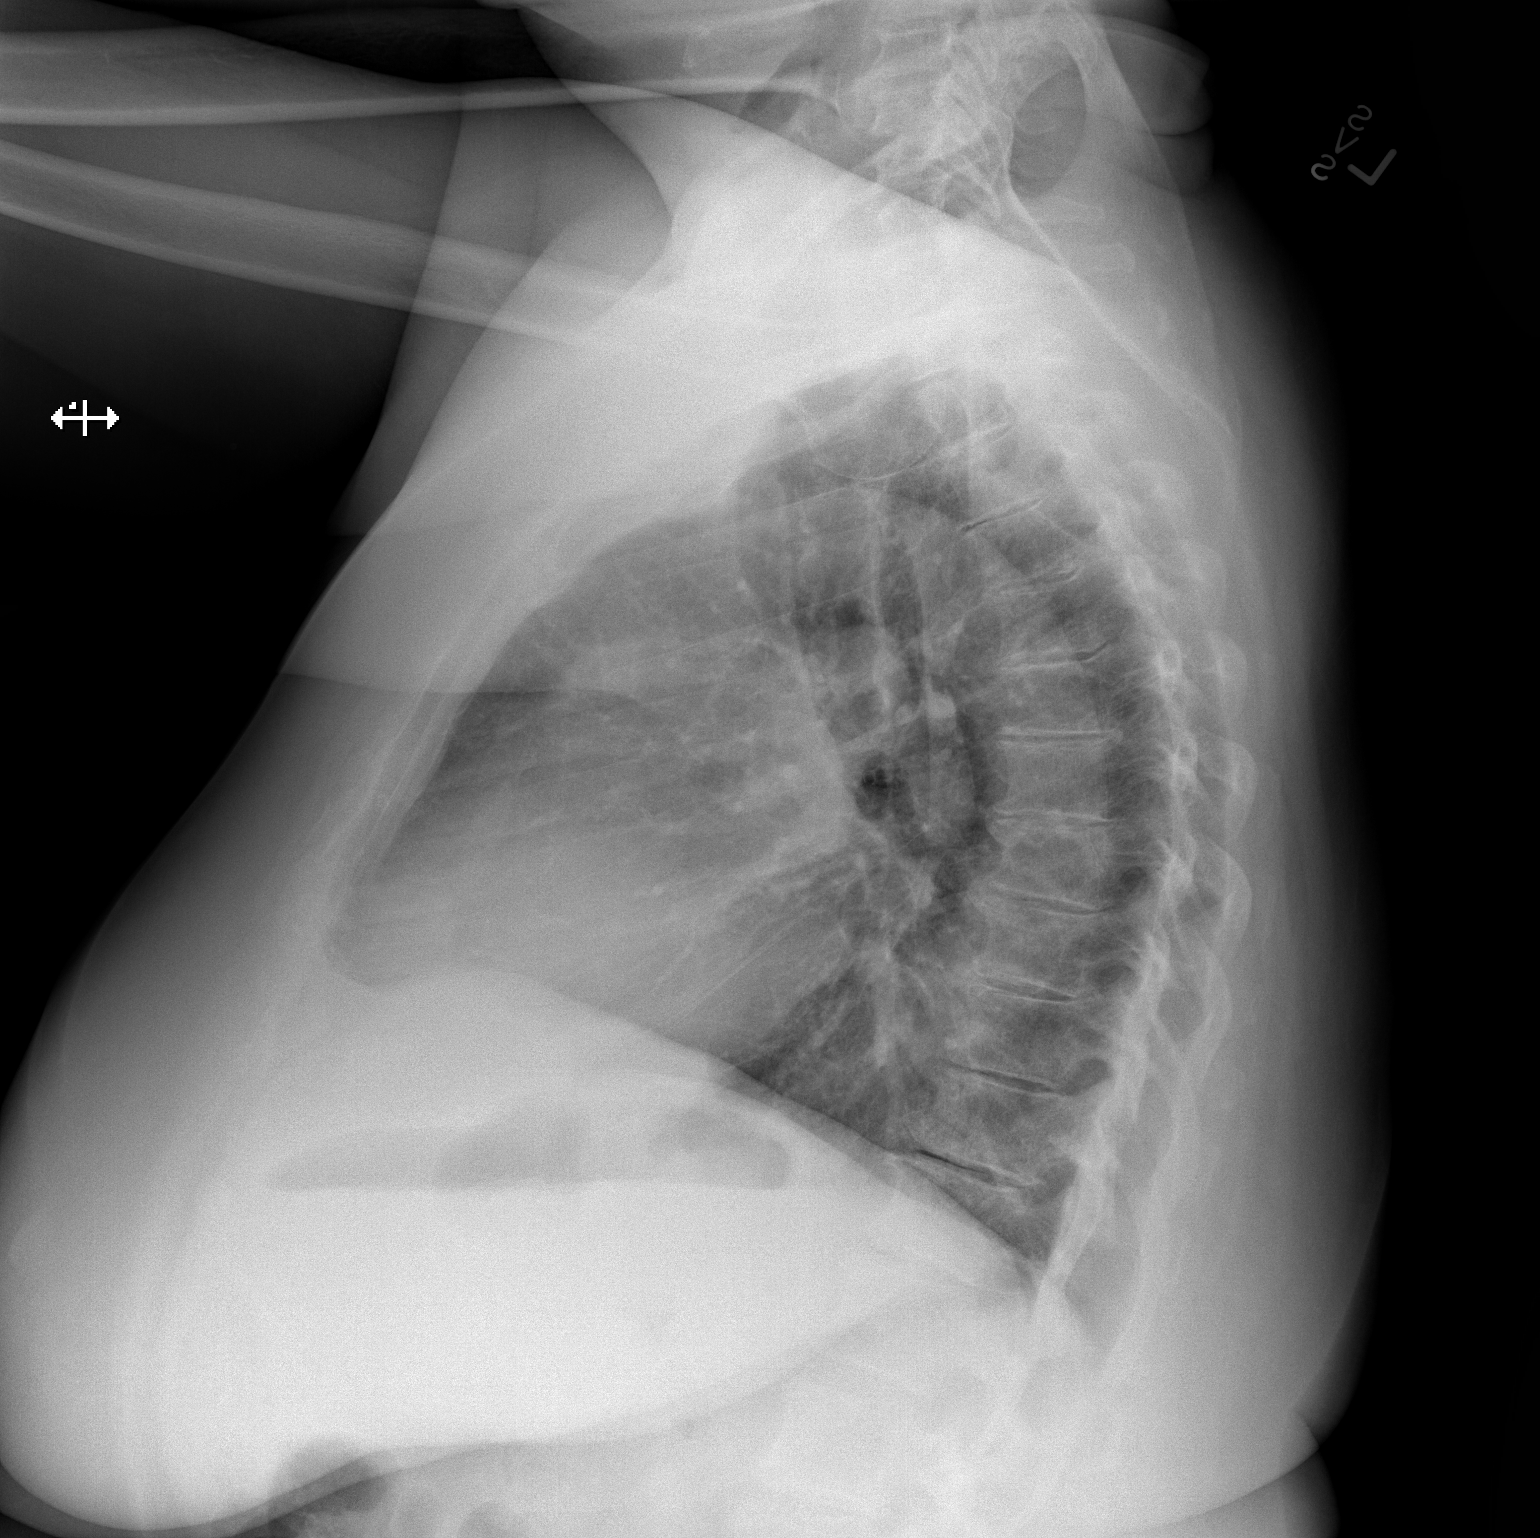

[2 of 2 positions shown; findings below may reference images not displayed]

FINDINGS: The lungs are well-aerated and clear. There is no evidence of focal
opacification, pleural effusion or pneumothorax.

The heart is borderline enlarged. No acute osseous abnormalities are
seen.
IMPRESSION: Borderline cardiomegaly.  Lungs remain grossly clear.

## 2019-03-10 ENCOUNTER — Encounter (HOSPITAL_COMMUNITY): Payer: Self-pay | Admitting: Orthopedic Surgery

## 2019-03-10 ENCOUNTER — Other Ambulatory Visit: Payer: Self-pay

## 2019-03-10 ENCOUNTER — Inpatient Hospital Stay (HOSPITAL_COMMUNITY): Payer: Medicare Other | Admitting: Certified Registered Nurse Anesthetist

## 2019-03-10 ENCOUNTER — Inpatient Hospital Stay (HOSPITAL_COMMUNITY): Payer: Medicare Other

## 2019-03-10 ENCOUNTER — Encounter (HOSPITAL_COMMUNITY)
Admission: AD | Disposition: A | Discharge status: Skilled Nursing Facility | Payer: Self-pay | Source: Other Acute Inpatient Hospital | Attending: Internal Medicine

## 2019-03-10 ENCOUNTER — Inpatient Hospital Stay (HOSPITAL_COMMUNITY)
Admission: AD | Admit: 2019-03-10 | Discharge: 2019-03-11 | DRG: 481 | Disposition: A | Discharge status: Skilled Nursing Facility | Payer: Medicare Other | Source: Other Acute Inpatient Hospital | Attending: Internal Medicine | Admitting: Internal Medicine

## 2019-03-10 DIAGNOSIS — Z833 Family history of diabetes mellitus: Secondary | ICD-10-CM | POA: Diagnosis not present

## 2019-03-10 DIAGNOSIS — I10 Essential (primary) hypertension: Secondary | ICD-10-CM | POA: Diagnosis not present

## 2019-03-10 DIAGNOSIS — Y92129 Unspecified place in nursing home as the place of occurrence of the external cause: Secondary | ICD-10-CM | POA: Diagnosis not present

## 2019-03-10 DIAGNOSIS — Z1159 Encounter for screening for other viral diseases: Secondary | ICD-10-CM | POA: Diagnosis not present

## 2019-03-10 DIAGNOSIS — S7292XA Unspecified fracture of left femur, initial encounter for closed fracture: Secondary | ICD-10-CM | POA: Diagnosis present

## 2019-03-10 DIAGNOSIS — Z86711 Personal history of pulmonary embolism: Secondary | ICD-10-CM

## 2019-03-10 DIAGNOSIS — Z981 Arthrodesis status: Secondary | ICD-10-CM | POA: Diagnosis not present

## 2019-03-10 DIAGNOSIS — D62 Acute posthemorrhagic anemia: Secondary | ICD-10-CM | POA: Diagnosis not present

## 2019-03-10 DIAGNOSIS — K219 Gastro-esophageal reflux disease without esophagitis: Secondary | ICD-10-CM | POA: Diagnosis present

## 2019-03-10 DIAGNOSIS — D72823 Leukemoid reaction: Secondary | ICD-10-CM

## 2019-03-10 DIAGNOSIS — D72828 Other elevated white blood cell count: Secondary | ICD-10-CM | POA: Diagnosis present

## 2019-03-10 DIAGNOSIS — E785 Hyperlipidemia, unspecified: Secondary | ICD-10-CM | POA: Diagnosis present

## 2019-03-10 DIAGNOSIS — F0390 Unspecified dementia without behavioral disturbance: Secondary | ICD-10-CM | POA: Diagnosis present

## 2019-03-10 DIAGNOSIS — J449 Chronic obstructive pulmonary disease, unspecified: Secondary | ICD-10-CM | POA: Diagnosis present

## 2019-03-10 DIAGNOSIS — S72462A Displaced supracondylar fracture with intracondylar extension of lower end of left femur, initial encounter for closed fracture: Secondary | ICD-10-CM | POA: Diagnosis present

## 2019-03-10 DIAGNOSIS — F419 Anxiety disorder, unspecified: Secondary | ICD-10-CM | POA: Diagnosis present

## 2019-03-10 DIAGNOSIS — F329 Major depressive disorder, single episode, unspecified: Secondary | ICD-10-CM | POA: Diagnosis present

## 2019-03-10 DIAGNOSIS — D649 Anemia, unspecified: Secondary | ICD-10-CM

## 2019-03-10 DIAGNOSIS — D638 Anemia in other chronic diseases classified elsewhere: Secondary | ICD-10-CM | POA: Diagnosis present

## 2019-03-10 DIAGNOSIS — Z79899 Other long term (current) drug therapy: Secondary | ICD-10-CM

## 2019-03-10 DIAGNOSIS — G8929 Other chronic pain: Secondary | ICD-10-CM | POA: Diagnosis present

## 2019-03-10 DIAGNOSIS — W1830XA Fall on same level, unspecified, initial encounter: Secondary | ICD-10-CM | POA: Diagnosis present

## 2019-03-10 DIAGNOSIS — D72829 Elevated white blood cell count, unspecified: Secondary | ICD-10-CM

## 2019-03-10 DIAGNOSIS — E559 Vitamin D deficiency, unspecified: Secondary | ICD-10-CM | POA: Diagnosis not present

## 2019-03-10 DIAGNOSIS — Z7901 Long term (current) use of anticoagulants: Secondary | ICD-10-CM

## 2019-03-10 DIAGNOSIS — Z419 Encounter for procedure for purposes other than remedying health state, unspecified: Secondary | ICD-10-CM

## 2019-03-10 DIAGNOSIS — Z86718 Personal history of other venous thrombosis and embolism: Secondary | ICD-10-CM

## 2019-03-10 DIAGNOSIS — Z96651 Presence of right artificial knee joint: Secondary | ICD-10-CM | POA: Diagnosis present

## 2019-03-10 DIAGNOSIS — H409 Unspecified glaucoma: Secondary | ICD-10-CM | POA: Diagnosis present

## 2019-03-10 DIAGNOSIS — Z9181 History of falling: Secondary | ICD-10-CM | POA: Diagnosis not present

## 2019-03-10 DIAGNOSIS — Z66 Do not resuscitate: Secondary | ICD-10-CM | POA: Diagnosis present

## 2019-03-10 DIAGNOSIS — M1712 Unilateral primary osteoarthritis, left knee: Secondary | ICD-10-CM | POA: Diagnosis present

## 2019-03-10 DIAGNOSIS — G4733 Obstructive sleep apnea (adult) (pediatric): Secondary | ICD-10-CM | POA: Diagnosis present

## 2019-03-10 DIAGNOSIS — Z7951 Long term (current) use of inhaled steroids: Secondary | ICD-10-CM

## 2019-03-10 DIAGNOSIS — Z8679 Personal history of other diseases of the circulatory system: Secondary | ICD-10-CM

## 2019-03-10 DIAGNOSIS — S72402A Unspecified fracture of lower end of left femur, initial encounter for closed fracture: Secondary | ICD-10-CM

## 2019-03-10 HISTORY — PX: ORIF FEMUR FRACTURE: SHX2119

## 2019-03-10 LAB — URINALYSIS, ROUTINE W REFLEX MICROSCOPIC
Bilirubin Urine: NEGATIVE
Glucose, UA: NEGATIVE mg/dL
Ketones, ur: 5 mg/dL — AB
Leukocytes,Ua: NEGATIVE
Nitrite: NEGATIVE
Protein, ur: 30 mg/dL — AB
Specific Gravity, Urine: 1.019 (ref 1.005–1.030)
pH: 6 (ref 5.0–8.0)

## 2019-03-10 LAB — BASIC METABOLIC PANEL
Anion gap: 11 (ref 5–15)
BUN: 15 mg/dL (ref 8–23)
CO2: 28 mmol/L (ref 22–32)
Calcium: 9.4 mg/dL (ref 8.9–10.3)
Chloride: 98 mmol/L (ref 98–111)
Creatinine, Ser: 0.81 mg/dL (ref 0.44–1.00)
GFR calc Af Amer: >60 mL/min (ref >60)
GFR calc non Af Amer: >60 mL/min (ref >60)
Glucose, Bld: 138 mg/dL — ABNORMAL HIGH (ref 70–99)
Potassium: 3.6 mmol/L (ref 3.5–5.1)
Sodium: 137 mmol/L (ref 135–145)

## 2019-03-10 LAB — FOLATE: Folate: 15.1 ng/mL (ref >5.9)

## 2019-03-10 LAB — CBC
HCT: 29.2 % — ABNORMAL LOW (ref 36.0–46.0)
Hemoglobin: 9.4 g/dL — ABNORMAL LOW (ref 12.0–15.0)
MCH: 26 pg (ref 26.0–34.0)
MCHC: 32.2 g/dL (ref 30.0–36.0)
MCV: 80.7 fL (ref 80.0–100.0)
Platelets: 213 10*3/uL (ref 150–400)
RBC: 3.62 MIL/uL — ABNORMAL LOW (ref 3.87–5.11)
RDW: 18.5 % — ABNORMAL HIGH (ref 11.5–15.5)
WBC: 11.9 10*3/uL — ABNORMAL HIGH (ref 4.0–10.5)
nRBC: 0 % (ref 0.0–0.2)

## 2019-03-10 LAB — IRON AND TIBC
Iron: 18 ug/dL — ABNORMAL LOW (ref 28–170)
Saturation Ratios: 4 % — ABNORMAL LOW (ref 10.4–31.8)
TIBC: 448 ug/dL (ref 250–450)
UIBC: 430 ug/dL

## 2019-03-10 LAB — RETICULOCYTES
Immature Retic Fract: 26.3 % — ABNORMAL HIGH (ref 2.3–15.9)
RBC.: 3.46 MIL/uL — ABNORMAL LOW (ref 3.87–5.11)
Retic Count, Absolute: 64 10*3/uL (ref 19.0–186.0)
Retic Ct Pct: 1.9 % (ref 0.4–3.1)

## 2019-03-10 LAB — SURGICAL PCR SCREEN
MRSA, PCR: NEGATIVE
Staphylococcus aureus: NEGATIVE

## 2019-03-10 LAB — PROTIME-INR
INR: 1.4 — ABNORMAL HIGH (ref 0.8–1.2)
Prothrombin Time: 16.5 seconds — ABNORMAL HIGH (ref 11.4–15.2)

## 2019-03-10 LAB — FERRITIN: Ferritin: 14 ng/mL (ref 11–307)

## 2019-03-10 LAB — SARS CORONAVIRUS 2: SARS Coronavirus 2: NOT DETECTED

## 2019-03-10 LAB — VITAMIN B12: Vitamin B-12: 618 pg/mL (ref 180–914)

## 2019-03-10 SURGERY — OPEN REDUCTION INTERNAL FIXATION (ORIF) DISTAL FEMUR FRACTURE
Anesthesia: General | Site: Leg Upper | Laterality: Left

## 2019-03-10 MED ORDER — LIDOCAINE 2% (20 MG/ML) 5 ML SYRINGE
INTRAMUSCULAR | Status: AC
  Filled 2019-03-10: qty 5

## 2019-03-10 MED ORDER — ONDANSETRON HCL 4 MG PO TABS: 4.0000 mg | ORAL_TABLET | Freq: Four times a day (QID) | ORAL | Status: DC | PRN

## 2019-03-10 MED ORDER — ONDANSETRON HCL 4 MG/2ML IJ SOLN: 4.0000 mg | Freq: Four times a day (QID) | INTRAMUSCULAR | Status: DC | PRN

## 2019-03-10 MED ORDER — SUGAMMADEX SODIUM 200 MG/2ML IV SOLN
INTRAVENOUS | Status: DC | PRN
  Administered 2019-03-10: 140 mg via INTRAVENOUS

## 2019-03-10 MED ORDER — ROCURONIUM BROMIDE 10 MG/ML (PF) SYRINGE
PREFILLED_SYRINGE | INTRAVENOUS | Status: AC
  Filled 2019-03-10: qty 10

## 2019-03-10 MED ORDER — CHOLECALCIFEROL 10 MCG (400 UNIT) PO TABS
800.0000 [IU] | ORAL_TABLET | Freq: Every day | ORAL | Status: DC
  Administered 2019-03-11: 800 [IU] via ORAL
  Filled 2019-03-10 (×2): qty 2

## 2019-03-10 MED ORDER — PHENYLEPHRINE 40 MCG/ML (10ML) SYRINGE FOR IV PUSH (FOR BLOOD PRESSURE SUPPORT)
PREFILLED_SYRINGE | INTRAVENOUS | Status: AC
  Filled 2019-03-10: qty 10

## 2019-03-10 MED ORDER — 0.9 % SODIUM CHLORIDE (POUR BTL) OPTIME
TOPICAL | Status: DC | PRN
  Administered 2019-03-10: 17:00:00 1000 mL

## 2019-03-10 MED ORDER — WARFARIN - PHARMACIST DOSING INPATIENT: Freq: Every day | Status: DC

## 2019-03-10 MED ORDER — WARFARIN SODIUM 5 MG PO TABS
5.0000 mg | ORAL_TABLET | ORAL | Status: AC
  Administered 2019-03-10: 5 mg via ORAL
  Filled 2019-03-10: qty 1

## 2019-03-10 MED ORDER — ROCURONIUM BROMIDE 100 MG/10ML IV SOLN
INTRAVENOUS | Status: DC | PRN
  Administered 2019-03-10: 50 mg via INTRAVENOUS

## 2019-03-10 MED ORDER — ENOXAPARIN SODIUM 40 MG/0.4ML ~~LOC~~ SOLN
40.0000 mg | SUBCUTANEOUS | Status: DC
  Administered 2019-03-11: 40 mg via SUBCUTANEOUS

## 2019-03-10 MED ORDER — PROPOFOL 10 MG/ML IV BOLUS
INTRAVENOUS | Status: DC | PRN
  Administered 2019-03-10: 100 mg via INTRAVENOUS

## 2019-03-10 MED ORDER — MORPHINE SULFATE (PF) 2 MG/ML IV SOLN
1.0000 mg | Freq: Once | INTRAVENOUS | Status: AC
  Administered 2019-03-10: 1 mg via INTRAVENOUS
  Filled 2019-03-10: qty 1

## 2019-03-10 MED ORDER — LIDOCAINE 2% (20 MG/ML) 5 ML SYRINGE
INTRAMUSCULAR | Status: DC | PRN
  Administered 2019-03-10: 60 mg via INTRAVENOUS

## 2019-03-10 MED ORDER — FENTANYL CITRATE (PF) 250 MCG/5ML IJ SOLN
INTRAMUSCULAR | Status: AC
  Filled 2019-03-10: qty 5

## 2019-03-10 MED ORDER — SODIUM CHLORIDE 0.9 % IV SOLN
INTRAVENOUS | Status: DC | PRN
  Administered 2019-03-10: 25 ug/min via INTRAVENOUS

## 2019-03-10 MED ORDER — SUCCINYLCHOLINE CHLORIDE 200 MG/10ML IV SOSY
PREFILLED_SYRINGE | INTRAVENOUS | Status: DC | PRN
  Administered 2019-03-10: 100 mg via INTRAVENOUS

## 2019-03-10 MED ORDER — CEFAZOLIN SODIUM-DEXTROSE 2-4 GM/100ML-% IV SOLN
2.0000 g | Freq: Four times a day (QID) | INTRAVENOUS | Status: AC
  Administered 2019-03-10 (×2): 2 g via INTRAVENOUS
  Filled 2019-03-10 (×2): qty 100

## 2019-03-10 MED ORDER — PHENOL 1.4 % MT LIQD: 1.0000 | OROMUCOSAL | Status: DC | PRN

## 2019-03-10 MED ORDER — SODIUM CHLORIDE 0.9 % IV SOLN
INTRAVENOUS | Status: AC
  Administered 2019-03-10: 06:00:00 via INTRAVENOUS

## 2019-03-10 MED ORDER — DOCUSATE SODIUM 100 MG PO CAPS
100.0000 mg | ORAL_CAPSULE | Freq: Two times a day (BID) | ORAL | Status: DC
  Administered 2019-03-10 – 2019-03-11 (×2): 100 mg via ORAL
  Filled 2019-03-10 (×2): qty 1

## 2019-03-10 MED ORDER — METOCLOPRAMIDE HCL 5 MG PO TABS: 5.0000 mg | ORAL_TABLET | Freq: Three times a day (TID) | ORAL | Status: DC | PRN

## 2019-03-10 MED ORDER — PHENYLEPHRINE 40 MCG/ML (10ML) SYRINGE FOR IV PUSH (FOR BLOOD PRESSURE SUPPORT)
PREFILLED_SYRINGE | INTRAVENOUS | Status: DC | PRN
  Administered 2019-03-10 (×2): 80 ug via INTRAVENOUS
  Administered 2019-03-10: 40 ug via INTRAVENOUS

## 2019-03-10 MED ORDER — DEXAMETHASONE SODIUM PHOSPHATE 10 MG/ML IJ SOLN
INTRAMUSCULAR | Status: DC | PRN
  Administered 2019-03-10: 5 mg via INTRAVENOUS

## 2019-03-10 MED ORDER — ONDANSETRON HCL 4 MG/2ML IJ SOLN
INTRAMUSCULAR | Status: AC
  Filled 2019-03-10: qty 2

## 2019-03-10 MED ORDER — HYDROCODONE-ACETAMINOPHEN 5-325 MG PO TABS
1.0000 | ORAL_TABLET | Freq: Four times a day (QID) | ORAL | Status: DC | PRN
  Administered 2019-03-11: 1 via ORAL
  Administered 2019-03-11: 2 via ORAL
  Filled 2019-03-10: qty 1
  Filled 2019-03-10: qty 2

## 2019-03-10 MED ORDER — METOCLOPRAMIDE HCL 5 MG/ML IJ SOLN: 5.0000 mg | Freq: Three times a day (TID) | INTRAMUSCULAR | Status: DC | PRN

## 2019-03-10 MED ORDER — CEFAZOLIN SODIUM-DEXTROSE 2-4 GM/100ML-% IV SOLN
2.0000 g | INTRAVENOUS | Status: AC
  Administered 2019-03-10: 2 g via INTRAVENOUS

## 2019-03-10 MED ORDER — EPHEDRINE 5 MG/ML INJ
INTRAVENOUS | Status: AC
  Filled 2019-03-10: qty 10

## 2019-03-10 MED ORDER — POVIDONE-IODINE 10 % EX SWAB: 2.0000 "application " | Freq: Once | CUTANEOUS | Status: DC

## 2019-03-10 MED ORDER — HYDRALAZINE HCL 20 MG/ML IJ SOLN: 5.0000 mg | INTRAMUSCULAR | Status: DC | PRN

## 2019-03-10 MED ORDER — DEXAMETHASONE SODIUM PHOSPHATE 10 MG/ML IJ SOLN
INTRAMUSCULAR | Status: AC
  Filled 2019-03-10: qty 1

## 2019-03-10 MED ORDER — FENTANYL CITRATE (PF) 100 MCG/2ML IJ SOLN
INTRAMUSCULAR | Status: DC | PRN
  Administered 2019-03-10: 50 ug via INTRAVENOUS
  Administered 2019-03-10 (×2): 25 ug via INTRAVENOUS
  Administered 2019-03-10 (×2): 50 ug via INTRAVENOUS

## 2019-03-10 MED ORDER — CHLORHEXIDINE GLUCONATE 4 % EX LIQD: 60.0000 mL | Freq: Once | CUTANEOUS | Status: DC

## 2019-03-10 MED ORDER — SENNA 8.6 MG PO TABS
1.0000 | ORAL_TABLET | Freq: Two times a day (BID) | ORAL | Status: DC
  Administered 2019-03-10 – 2019-03-11 (×2): 8.6 mg via ORAL
  Filled 2019-03-10 (×2): qty 1

## 2019-03-10 MED ORDER — IPRATROPIUM-ALBUTEROL 0.5-2.5 (3) MG/3ML IN SOLN: 3.0000 mL | Freq: Four times a day (QID) | RESPIRATORY_TRACT | Status: DC | PRN

## 2019-03-10 MED ORDER — MORPHINE SULFATE (PF) 2 MG/ML IV SOLN
1.0000 mg | INTRAVENOUS | Status: DC | PRN
  Administered 2019-03-10: 1 mg via INTRAVENOUS
  Filled 2019-03-10: qty 1

## 2019-03-10 MED ORDER — LACTATED RINGERS IV SOLN
INTRAVENOUS | Status: DC
  Administered 2019-03-10: 11:00:00 via INTRAVENOUS

## 2019-03-10 MED ORDER — MENTHOL 3 MG MT LOZG: 1.0000 | LOZENGE | OROMUCOSAL | Status: DC | PRN

## 2019-03-10 SURGICAL SUPPLY — 82 items
BANDAGE ACE 4X5 VEL STRL LF (GAUZE/BANDAGES/DRESSINGS) ×3 IMPLANT
BANDAGE ACE 6X5 VEL STRL LF (GAUZE/BANDAGES/DRESSINGS) ×3 IMPLANT
BANDAGE ELASTIC 4 VELCRO ST LF (GAUZE/BANDAGES/DRESSINGS) ×2 IMPLANT
BANDAGE ELASTIC 6 VELCRO ST LF (GAUZE/BANDAGES/DRESSINGS) ×2 IMPLANT
BIT DRILL 4.3 (BIT) ×2
BIT DRILL 4.3MM (BIT) ×1
BIT DRILL 4.3X300MM (BIT) IMPLANT
BIT DRILL LONG 3.3 (BIT) ×1 IMPLANT
BIT DRILL LONG 3.3MM (BIT) ×1
BIT DRILL QC 3.3X195 (BIT) ×2 IMPLANT
BLADE CLIPPER SURG (BLADE) IMPLANT
BNDG GAUZE ELAST 4 BULKY (GAUZE/BANDAGES/DRESSINGS) ×3 IMPLANT
BRUSH SCRUB SURG 4.25 DISP (MISCELLANEOUS) ×6 IMPLANT
CANISTER SUCT 3000ML PPV (MISCELLANEOUS) ×3 IMPLANT
CAP LOCK NCB (Cap) ×12 IMPLANT
COVER SURGICAL LIGHT HANDLE (MISCELLANEOUS) ×3 IMPLANT
COVER WAND RF STERILE (DRAPES) ×3 IMPLANT
DRAPE C-ARM 42X72 X-RAY (DRAPES) ×3 IMPLANT
DRAPE C-ARMOR (DRAPES) ×3 IMPLANT
DRAPE IMP U-DRAPE 54X76 (DRAPES) ×3 IMPLANT
DRAPE ORTHO SPLIT 77X108 STRL (DRAPES) ×6
DRAPE SURG ORHT 6 SPLT 77X108 (DRAPES) ×3 IMPLANT
DRAPE U-SHAPE 47X51 STRL (DRAPES) ×3 IMPLANT
DRSG ADAPTIC 3X8 NADH LF (GAUZE/BANDAGES/DRESSINGS) ×3 IMPLANT
DRSG MEPILEX BORDER 4X8 (GAUZE/BANDAGES/DRESSINGS) ×4 IMPLANT
DRSG PAD ABDOMINAL 8X10 ST (GAUZE/BANDAGES/DRESSINGS) ×12 IMPLANT
ELECT REM PT RETURN 9FT ADLT (ELECTROSURGICAL) ×3
ELECTRODE REM PT RTRN 9FT ADLT (ELECTROSURGICAL) ×1 IMPLANT
EVACUATOR 1/8 PVC DRAIN (DRAIN) IMPLANT
EVACUATOR 3/16  PVC DRAIN (DRAIN)
EVACUATOR 3/16 PVC DRAIN (DRAIN) IMPLANT
GAUZE SPONGE 4X4 12PLY STRL (GAUZE/BANDAGES/DRESSINGS) ×3 IMPLANT
GLOVE BIO SURGEON STRL SZ7.5 (GLOVE) ×3 IMPLANT
GLOVE BIO SURGEON STRL SZ8 (GLOVE) ×3 IMPLANT
GLOVE BIOGEL PI IND STRL 7.5 (GLOVE) ×1 IMPLANT
GLOVE BIOGEL PI IND STRL 8 (GLOVE) ×1 IMPLANT
GLOVE BIOGEL PI INDICATOR 7.5 (GLOVE) ×2
GLOVE BIOGEL PI INDICATOR 8 (GLOVE) ×2
GOWN STRL REUS W/ TWL LRG LVL3 (GOWN DISPOSABLE) ×2 IMPLANT
GOWN STRL REUS W/ TWL XL LVL3 (GOWN DISPOSABLE) ×1 IMPLANT
GOWN STRL REUS W/TWL LRG LVL3 (GOWN DISPOSABLE) ×4
GOWN STRL REUS W/TWL XL LVL3 (GOWN DISPOSABLE) ×2
K-WIRE 2.0 (WIRE) ×4
K-WIRE FXSTD 280X2XNS SS (WIRE) ×2
KIT BASIN OR (CUSTOM PROCEDURE TRAY) ×3 IMPLANT
KIT TURNOVER KIT B (KITS) ×3 IMPLANT
KWIRE FXSTD 280X2XNS SS (WIRE) IMPLANT
NEEDLE 22X1 1/2 (OR ONLY) (NEEDLE) IMPLANT
NS IRRIG 1000ML POUR BTL (IV SOLUTION) ×3 IMPLANT
PACK TOTAL JOINT (CUSTOM PROCEDURE TRAY) ×3 IMPLANT
PACK UNIVERSAL I (CUSTOM PROCEDURE TRAY) ×3 IMPLANT
PAD ARMBOARD 7.5X6 YLW CONV (MISCELLANEOUS) ×6 IMPLANT
PAD CAST 4YDX4 CTTN HI CHSV (CAST SUPPLIES) ×1 IMPLANT
PADDING CAST COTTON 4X4 STRL (CAST SUPPLIES) ×2
PADDING CAST COTTON 6X4 STRL (CAST SUPPLIES) ×3 IMPLANT
PLATE BONE LOCK 238MM 9HOLE (Plate) ×2 IMPLANT
SCREW 5.0 80MM (Screw) ×4 IMPLANT
SCREW CORTICAL NCB 5.0X40 (Screw) ×2 IMPLANT
SCREW NCB 3.5X75X5X6.2XST (Screw) IMPLANT
SCREW NCB 4.0 32MM (Screw) ×2 IMPLANT
SCREW NCB 4.0MX34M (Screw) ×2 IMPLANT
SCREW NCB 5.0X34MM (Screw) ×2 IMPLANT
SCREW NCB 5.0X36MM (Screw) ×2 IMPLANT
SCREW NCB 5.0X75MM (Screw) ×4 IMPLANT
SCREW NCB 5.0X85MM (Screw) ×4 IMPLANT
SPONGE LAP 18X18 RF (DISPOSABLE) ×3 IMPLANT
STAPLER VISISTAT 35W (STAPLE) ×3 IMPLANT
SUCTION FRAZIER HANDLE 10FR (MISCELLANEOUS) ×2
SUCTION TUBE FRAZIER 10FR DISP (MISCELLANEOUS) ×1 IMPLANT
SUT ETHILON 2 0 PSLX (SUTURE) ×2 IMPLANT
SUT PROLENE 0 CT 2 (SUTURE) ×4 IMPLANT
SUT VIC AB 0 CT1 27 (SUTURE) ×4
SUT VIC AB 0 CT1 27XBRD ANBCTR (SUTURE) ×2 IMPLANT
SUT VIC AB 1 CT1 27 (SUTURE) ×4
SUT VIC AB 1 CT1 27XBRD ANBCTR (SUTURE) ×2 IMPLANT
SUT VIC AB 2-0 CT1 27 (SUTURE) ×4
SUT VIC AB 2-0 CT1 TAPERPNT 27 (SUTURE) ×2 IMPLANT
SYR 20ML ECCENTRIC (SYRINGE) IMPLANT
TOWEL OR 17X24 6PK STRL BLUE (TOWEL DISPOSABLE) ×3 IMPLANT
TOWEL OR 17X26 10 PK STRL BLUE (TOWEL DISPOSABLE) ×6 IMPLANT
TRAY FOLEY MTR SLVR 16FR STAT (SET/KITS/TRAYS/PACK) IMPLANT
WATER STERILE IRR 1000ML POUR (IV SOLUTION) ×6 IMPLANT

## 2019-03-10 NOTE — Plan of Care (Signed)
Problem: Education: Goal: Knowledge of General Education information will improve Description Including pain rating scale, medication(s)/side effects and non-pharmacologic comfort measures Outcome: Progressing   Problem: Health Behavior/Discharge Planning: Goal: Ability to manage health-related needs will improve Outcome: Progressing   Problem: Clinical Measurements: Goal: Respiratory complications will improve Outcome: Progressing Goal: Cardiovascular complication will be avoided Outcome: Progressing   Problem: Coping: Goal: Level of anxiety will decrease Outcome: Progressing   Problem: Elimination: Goal: Will not experience complications related to urinary retention Outcome: Progressing   Problem: Pain Managment: Goal: General experience of comfort will improve Outcome: Progressing   Problem: Safety: Goal: Ability to remain free from injury will improve Outcome: Progressing   Problem: Skin Integrity: Goal: Risk for impaired skin integrity will decrease Outcome: Progressing   

## 2019-03-10 NOTE — Consult Note (Signed)
Reason for Consult:Left femur fx Referring Physician: [name] is an 78 y.o. female.  HPI: [name] [name] fell at the SNF where she resides. She was taken to [facility] where a left distal femur fx was noted. Dr. Alvan Dame was consulted and recommended transfer to [facility] for definitive treatment and asked Dr. Marcelino Scot to assume care. She is moderately demented and could not contribute much to history but was able to tell me that she struggles with that knee and has a history of falls. Attempts were made to contact family to no avail.  Past Medical History:  Diagnosis Date  . Anxiety   . Arthritis    "joints" (03/30/2013)  . Asthma   . Chest pain, exertional   . Chronic bronchitis ([place])   . Chronic pain    "over my whole body" (03/30/2013)  . Complication of anesthesia    "because I have sleep apnea" (03/30/2013)  . Daily headache    "last 2 months" (03/30/2013)  . Depression   . DVT (deep venous thrombosis) ([place])    per 01/17/10 d/c summary- "remote hx of dvt"?  . Exertional shortness of breath    "& sometimes when laying down" (03/30/2013)  . GERD (gastroesophageal reflux disease)   . Glaucoma   . Heart murmur   . Hemorrhoids   . Hyperlipidemia   . Hypertension   . Iron deficiency anemia   . Pneumonia    "a few times" (03/30/2013)  . Psoriasis 04/29/2012   New onset evaluated by Dr Juliet Rude, Dermatology, [facility] 6/13  Rx 0.1% Tacrolimus ointment  . Psoriasis   . Pulmonary embolism ([place]) 12/2009   Large central bilateral PE's   . Schizophrenia ([place])   . Sleep apnea    "waiting on my CPAP" (03/30/2013)  . Varicose veins of legs     Past Surgical History:  Procedure Laterality Date  . CARPAL TUNNEL RELEASE Bilateral   . DILATION AND CURETTAGE OF UTERUS  1970's   "once" (03/30/2013)  . JOINT REPLACEMENT    . SPINAL FUSION     2004  . TOTAL KNEE ARTHROPLASTY Right 11/2008  . TUBAL LIGATION  1973  . VEIN SURGERY  left leg    Family History  Problem Relation Age of  Onset  . Diabetes Sister   . Colon cancer Brother 47  . Colon cancer Brother     Social History:  reports that she has never smoked. She has never used smokeless tobacco. She reports that she does not drink alcohol or use drugs.  Allergies: No Known Allergies  Medications: I have reviewed the patient's current medications.  Results for orders placed or performed during the hospital encounter of 03/10/19 (from the past 48 hour(s))  CBC     Status: Abnormal   Collection Time: 03/10/19  5:00 AM  Result Value Ref Range   WBC 11.9 (H) 4.0 - 10.5 K/uL   RBC 3.62 (L) 3.87 - 5.11 MIL/uL   Hemoglobin 9.4 (L) 12.0 - 15.0 g/dL   HCT 29.2 (L) 36.0 - 46.0 %   MCV 80.7 80.0 - 100.0 fL   MCH 26.0 26.0 - 34.0 pg   MCHC 32.2 30.0 - 36.0 g/dL   RDW 18.5 (H) 11.5 - 15.5 %   Platelets 213 150 - 400 K/uL   nRBC 0.0 0.0 - 0.2 %    Comment: Performed at [facility], [address] [place], [place] [address]  Basic metabolic panel  Status: Abnormal   Collection Time: 03/10/19  5:00 AM  Result Value Ref Range   Sodium 137 135 - 145 mmol/L   Potassium 3.6 3.5 - 5.1 mmol/L   Chloride 98 98 - 111 mmol/L   CO2 28 22 - 32 mmol/L   Glucose, Bld 138 (H) 70 - 99 mg/dL   BUN 15 8 - 23 mg/dL   Creatinine, Ser 0.450.81 0.44 - 1.00 mg/dL   Calcium 9.4 8.9 - 40.910.3 mg/dL   GFR calc non Af Amer >60 >60 mL/min   GFR calc Af Amer >60 >60 mL/min   Anion gap 11 5 - 15    Comment: Performed at [facility], [address] [place], [place] [address]  SARS Coronavirus 2     Status: None   Collection Time: 03/10/19  6:14 AM  Result Value Ref Range   SARS Coronavirus 2 NOT DETECTED NOT DETECTED    Comment: (NOTE) SARS-CoV-2 target nucleic acids are NOT DETECTED. The SARS-CoV-2 RNA is generally detectable in upper and lower respiratory specimens during the acute phase of infection.  Negative  results do not preclude SARS-CoV-2 infection, do not rule out co-infections with other pathogens, and should  not be used as the sole basis for treatment or other patient management decisions.  Negative results must be combined with clinical observations, patient history, and epidemiological information. The expected result is Not Detected. Fact Sheet for Patients: http://www.biofiredefense.com/wp-content/uploads/2020/03/BIOFIRE-COVID -19-patients.pdf Fact Sheet for Healthcare Providers: http://www.biofiredefense.com/wp-content/uploads/2020/03/BIOFIRE-COVID -19-hcp.pdf This test is not yet approved or cleared by the [place] FDA and  has been authorized for detection and/or diagnosis of SARS-CoV-2 by FDA under an Emergency Use Authorization (EUA).  This EUA will remain in effec t (meaning this test can be used) for the duration of  the COVID-19 declaration under Section 564(b)(1) of the Act, 21 U.S.C. section 360bbb-3(b)(1), unless the authorization is terminated or revoked sooner. Performed at [facility], [address] [place], [place] [address]   Vitamin B12     Status: None   Collection Time: 03/10/19  7:23 AM  Result Value Ref Range   Vitamin B-12 618 180 - 914 pg/mL    Comment: (NOTE) This assay is not validated for testing neonatal or myeloproliferative syndrome specimens for Vitamin B12 levels. Performed at [facility], [address] [place], [place] [address]   Folate     Status: None   Collection Time: 03/10/19  7:23 AM  Result Value Ref Range   Folate 15.1 >5.9 ng/mL    Comment: Performed at [facility], [address] [place], [place] [address]  Iron and TIBC     Status: Abnormal   Collection Time: 03/10/19  7:23 AM  Result Value Ref Range   Iron 18 (L) 28 - 170 ug/dL   TIBC 846448 962250 - 952450 ug/dL   Saturation Ratios 4 (L) 10.4 - 31.8 %   UIBC 430 ug/dL    Comment: Performed at [facility], [address] [place], [place] [address]  Ferritin     Status: None   Collection Time: 03/10/19  7:23 AM  Result Value Ref Range   Ferritin  14 11 - 307 ng/mL    Comment: Performed at [facility], [address] [place], [place] [address]  Reticulocytes     Status: Abnormal   Collection Time: 03/10/19  7:23 AM  Result Value Ref Range   Retic Ct Pct 1.9 0.4 - 3.1 %  RBC. 3.46 (L) 3.87 - 5.11 MIL/uL   Retic Count, Absolute 64.0 19.0 - 186.0 K/uL   Immature Retic Fract 26.3 (H) 2.3 - 15.9 %    Comment: Performed at [facility], [address] [place], [place] [address]  Urinalysis, Routine w reflex microscopic     Status: Abnormal   Collection Time: 03/10/19  7:46 AM  Result Value Ref Range   Color, Urine YELLOW YELLOW   APPearance HAZY (A) CLEAR   Specific Gravity, Urine 1.019 1.005 - 1.030   pH 6.0 5.0 - 8.0   Glucose, UA NEGATIVE NEGATIVE mg/dL   Hgb urine dipstick SMALL (A) NEGATIVE   Bilirubin Urine NEGATIVE NEGATIVE   Ketones, ur 5 (A) NEGATIVE mg/dL   Protein, ur 30 (A) NEGATIVE mg/dL   Nitrite NEGATIVE NEGATIVE   Leukocytes,Ua NEGATIVE NEGATIVE   RBC / HPF 0-5 0 - 5 RBC/hpf   WBC, UA 6-10 0 - 5 WBC/hpf   Bacteria, UA RARE (A) NONE SEEN   Squamous Epithelial / LPF 6-10 0 - 5   Mucus PRESENT     Comment: Performed at [facility], [address] [place], [place] [address]    No results found.  Review of Systems  Unable to perform ROS: Dementia  Musculoskeletal: Positive for joint pain (Left knee).   Blood pressure 136/87, pulse (!) 120, temperature 98.9 F (37.2 C), temperature source Oral, resp. rate 14, last menstrual period 11/22/1996, SpO2 98 %. Physical Exam  Constitutional: She appears well-developed and well-nourished. No distress.  HENT:  Head: Normocephalic and atraumatic.  Eyes: Conjunctivae are normal. Right eye exhibits no discharge. Left eye exhibits no discharge. No scleral icterus.  Neck: Normal range of motion.  Cardiovascular: Normal rate and regular rhythm.  Respiratory: Effort normal. No respiratory distress.  Musculoskeletal:     Comments: LLE No traumatic  wounds, ecchymosis, or rash  KI, ACE in place  No ankle effusion  Sens DPN, SPN, TN intact  Motor EHL, ext, flex, evers 5/5  DP 1+, PT 1+, No significant edema  Neurological: She is alert.  Skin: Skin is warm and dry. She is not diaphoretic.  Psychiatric: She has a normal mood and affect. Her behavior is normal.    Assessment/Plan: Left distal femur fx -- Plan for ORIF later today by Dr. Carola FrostHandy. Please keep NPO until then. Multiple medical problems including dementia, CHF, hypertension, hyperlipidemia, COPD, and OSA -- per primary service    Freeman CaldronMichael J. Eddrick Dilone, PA-C Orthopedic Surgery [phone] 03/10/2019, 9:09 AM

## 2019-03-10 NOTE — Progress Notes (Signed)
Pt placed on AUto CPAP with nasal mask with 2L bleed in.  Pt tolerating well at this time.  RT will continue to monitor.

## 2019-03-10 NOTE — Progress Notes (Signed)
ANTICOAGULATION CONSULT NOTE - Follow Up Consult  Pharmacy Consult for Warfarin Indication: chronic DVT / left femur fracture  No Known Allergies  Vital Signs: Temp: 97.2 F (36.2 C) (06/16 1756) Temp Source: Oral (06/16 0750) BP: 140/82 (06/16 1756) Pulse Rate: 94 (06/16 1756)  Labs: Recent Labs    03/10/19 0500 03/10/19 1032  HGB 9.4*  --   HCT 29.2*  --   PLT 213  --   LABPROT  --  16.5*  INR  --  1.4*  CREATININE 0.81  --     CrCl cannot be calculated (Unknown ideal weight.).  Assessment: 78 year old female to begin warfarin s/p ORIF 6/16 Prior to admission she was taking Xarelto for chronic DVT  Goal of Therapy:  INR 2-3 Monitor platelets by anticoagulation protocol: Yes   Plan:  Warfarin 5 mg po x 1 dose tonight Daily INR  Thank you Anette Guarneri, PharmD [phone] 03/10/2019,6:13 PM

## 2019-03-10 NOTE — Anesthesia Procedure Notes (Signed)
Procedure Name: Intubation Date/Time: 03/10/2019 2:59 PM Performed by: Candis Shine, CRNA Pre-anesthesia Checklist: Patient identified, Emergency Drugs available, Suction available and Patient being monitored Patient Re-evaluated:Patient Re-evaluated prior to induction Oxygen Delivery Method: Circle System Utilized Preoxygenation: Pre-oxygenation with 100% oxygen Induction Type: IV induction and Rapid sequence Laryngoscope Size: Mac and 3 Grade View: Grade I Tube type: Oral Tube size: 7.0 mm Number of attempts: 1 Airway Equipment and Method: Stylet Placement Confirmation: ETT inserted through vocal cords under direct vision,  positive ETCO2 and breath sounds checked- equal and bilateral Secured at: 22 cm Tube secured with: Tape Dental Injury: Teeth and Oropharynx as per pre-operative assessment

## 2019-03-10 NOTE — Progress Notes (Signed)
Pt returned to room 5N07 via bed after surgery. Received report from [place], [place] in PACU. See reassessment. Will continue to monitor.  RN called pt's daughter, Prentiss Bells, to inform her pt has returned to 5N07. All questions answered to satisfaction.

## 2019-03-10 NOTE — Op Note (Signed)
03/10/2019  6:18 PM  PATIENT:  [name] [name]  78 y.o. female  PRE-OPERATIVE DIAGNOSIS:  Left Distal Femur Fracture with Intracondylar Extension  POST-OPERATIVE DIAGNOSIS:  Left Distal Femur Fracture with Intracondylar Extension  PROCEDURE:  Procedure(s): OPEN REDUCTION INTERNAL FIXATION (ORIF) DISTAL FEMUR FRACTURE (Left) with Intracondylar Extension  SURGEON:  Surgeon(s) and Role:    Altamese Benton, MD - Primary  ASSISTANTS: None   ANESTHESIA:   general  EBL:  50 mL   BLOOD ADMINISTERED:none  DRAINS: none   LOCAL MEDICATIONS USED:  NONE  SPECIMEN:  No Specimen  DISPOSITION OF SPECIMEN:  N/A  COUNTS:  YES  TOURNIQUET:  * No tourniquets in log *  DICTATION: .Note written in EPIC  PLAN OF CARE: Admit to inpatient   PATIENT DISPOSITION:  PACU - hemodynamically stable.   Delay start of Pharmacological VTE agent (>24hrs) due to surgical blood loss or risk of bleeding: no  BRIEF SUMMARY OF INDICATION FOR PROCEDURE:  [name] [name] is a pleasant 78 y.o. who sustained extremity trauma in ground level fall. The risks and benefits of surgery were discussed with the family including the possibility of infection, nerve injury, vessel injury, DVT/ PE, loss of motion, arthritis, symptomatic hardware, malunion, nonunion, and need for further surgery among others.  After full discussion, consent was given to proceed by the daughter who also discussed Code Status with Dr. Ola Spurr.  BRIEF SUMMARY OF PROCEDURE:  The patient was taken to the operating room where general anesthesia was induced.  The left lower extremity was prepped and draped in usual sterile fashion.  No tourniquet was used during the procedure. C-arm was brought in to mark the starting point on AP and lateral images distally.  Incision was then made.  Dissection was carried carefully down to the retinaculum, which was incised and divided.  The joint and intraarticular fracture were lavaged and hematoma  evacuated. Traction and angular manipulation was performed to straighten the knee joint and obtain proper reduction and alignment. The radiolucent triangle and bumps were used to fine tune alignment and translation on the AP and lateral views.  We were careful to watch for rotation throughout.  The 9-hole Biomet NCB plate was then advanced proximally.  Once we were satisfied with position on AP and lateral images, a pin was placed distally and then a single screw proximally.  This was followed by additional K-wire fixation and additional screw.  We then brought the knee into full extension and checked the rotation and alignment dialing this in.  This was followed by additional bicortical screw fixation proximally such that we ended with 4 bicortical screws and 5 screws into the supracondylar region distally.  I could visualize the articular surface and all screws were extra-articular.  Wounds were irrigated thoroughly.  C-arm was brought in to confirm appropriate reduction, hardware placement, trajectory and length.  I then evaluated the knee joint under live fluoro allowing both posterior stress, which showed full extension without translation. All wounds were irrigated thoroughly and then closed in standard layered fashion using #1 Vicryl, #0 Vicryl, 2-0 Vicryl, and 2-0 nylon.  A gently compressive dressing was applied from foot to thigh and then knee immobilizer.The patient was taken to PACU in stable condition.   PROGNOSIS:  The patient has preexistent arthritis which may limit mobility and recovery from this injury, and increase the risks of loss of motion and arthritis. Patient will be nonweightbearing on the operative extremity with early mobilization encouraged. Pharmacologic DVT prophylaxis will  be with Lovenox, particularly given her PE/ DVT history. She will not require bracing and could partial weight bear for transfers.    Astrid Divine. Marcelino Scot, M.D.

## 2019-03-10 NOTE — Transfer of Care (Signed)
Immediate Anesthesia Transfer of Care Note  Patient: [name] [name]  Procedure(s) Performed: OPEN REDUCTION INTERNAL FIXATION (ORIF) DISTAL FEMUR FRACTURE (Left Leg Upper)  Patient Location: PACU  Anesthesia Type:General  Level of Consciousness: awake  Airway & Oxygen Therapy: Patient Spontanous Breathing and Patient connected to nasal cannula oxygen  Post-op Assessment: Report given to RN and Post -op Vital signs reviewed and stable  Post vital signs: Reviewed and stable  Last Vitals:  Vitals Value Taken Time  BP 139/78 03/10/19 1814  Temp 36.7 C 03/10/19 1814  Pulse 98 03/10/19 1814  Resp 15 03/10/19 1814  SpO2 98 % 03/10/19 1814    Last Pain:  Vitals:   03/10/19 1814  TempSrc: Oral         Complications: No apparent anesthesia complications

## 2019-03-10 NOTE — Progress Notes (Signed)
PROGRESS NOTE    [name] [name]  MVH:[id number] DOB: October 12, 1940 DOA: 03/10/2019 PCP: [facility]   Brief Narrative:  [name] [name] is a 78 y.o. female with medical history significant of dementia, CHF, hypertension, hyperlipidemia, COPD, OSA presented to [facility] ED from her nursing facility after a witnessed fall.  Found to have left distal femur fracture on imaging.  Dr. Charlann Boxer from orthopedics was consulted and recommended transfer to [facility].  Orthopedics will consult in a.m. Patient is oriented to self only.  Appears uncomfortable and complaining of pain in her left knee.  No additional history could be obtained from her.  ED Course at [facility]:  [facility] count 12.5, hemoglobin 10.4, platelet count 236,000 Sodium 136, potassium 4.2, chloride 97, bicarb 25, BUN 14, creatinine 1.6, glucose 153 LFTs normal INR 1.1 25 hydroxy vitamin D level 29.4 COVID-19 rapid test negative CT of left lower extremity without contrast: Comminuted impacted C1/C3 fracture of the distal femur. X-ray of left knee: Moderately impacted supracondylar fracture of the distal femur. X-ray of left hip/pelvis: No acute hip or pelvic fracture identified. X-ray of left femur: Impacted distal femur fracture. CT head negative for acute finding. CT C-spine negative for acute finding. Chest x-ray: No acute cardiopulmonary findings. Knee immobilizer placed in the ED.  Assessment & Plan:   Principal Problem:   Femur fracture, left (HCC) Active Problems:   Leukocytosis   Anemia   Vitamin D deficiency   HTN (hypertension)  Comminuted impacted fracture of the distal left femur secondary to a mechanical fall at SNF -Orthopedics following - appreciate insight and recs - planned for ORIF 6/16 -Pain well managed currently -Knee immobilizer in place -Bedrest until cleared by ortho  Reactive leukocytosis, does not meet sepsis/sirs criteria -follow with am labs - no clear signs/symptoms  of infection  Anemia, likely chronic anemia of chronic disease -Follow with am labs -unknown baseline  Vitamin D deficiency 25 hydroxy vitamin D level checked at outside hospital 29.4. -Continue vitamin D 800 IU daily  OSA -CPAP at night  Hypertension -Hydralazine PRN  COPD -Not wheezing or hypoxic.  DuoNebs PRN.  CHF -Currently euvolemic on exam.   DVT prophylaxis: Per orthopedic surgery pending surgical evaluation Code Status: DNR Family Communication: Attempted phone call this morning, will reattempt this afternoon, no answer currently Disposition Plan: Currently evaluated for surgery, likely remain inpatient for 48 to 72 hours postoperatively for PT evaluation and placement session.  Patient is from SNF.   Consultants:   Orthopedic surgery  Procedures:   ORIF of left femur fracture planned 03/10/2019  Subjective: No acute issues or events overnight, patient currently n.p.o., denies uncontrolled pain, shortness of breath, chest pain, nausea, vomiting, diarrhea, constipation, headache, fevers, chills.  Objective: Vitals:   03/10/19 0712 03/10/19 0750  BP: (!) 124/95 136/87  Pulse: (!) 118 (!) 120  Resp: 16 14  Temp: (!) 100.4 F (38 C) 98.9 F (37.2 C)  TempSrc: Oral Oral  SpO2: 95% 98%    Intake/Output Summary (Last 24 hours) at 03/10/2019 1128 Last data filed at 03/10/2019 1000 Gross per 24 hour  Intake 0 ml  Output 250 ml  Net -250 ml   There were no vitals filed for this visit.  Examination:  General:  Pleasantly resting in bed, No acute distress.  Alert to person and general situation, called the answering simple questions such as current living facility, family member names. HEENT:  Normocephalic atraumatic.  Sclerae nonicteric, noninjected.  Extraocular movements intact  bilaterally. Neck:  Without mass or deformity.  Trachea is midline. Lungs:  Clear to auscultate bilaterally without rhonchi, wheeze, or rales. Heart:  Regular rate and  rhythm.  Without murmurs, rubs, or gallops. Abdomen:  Soft, nontender, nondistended.  Without guarding or rebound. Extremities: Without cyanosis, clubbing, edema, or obvious deformity. Vascular:  Dorsalis pedis and posterior tibial pulses palpable bilaterally. Skin:  Warm and dry, no erythema, no ulcerations.  Data Reviewed: I have personally reviewed following labs and imaging studies  CBC: Recent Labs  Lab 03/10/19 0500  WBC 11.9*  HGB 9.4*  HCT 29.2*  MCV 80.7  PLT 213   Basic Metabolic Panel: Recent Labs  Lab 03/10/19 0500  NA 137  K 3.6  CL 98  CO2 28  GLUCOSE 138*  BUN 15  CREATININE 0.81  CALCIUM 9.4   GFR: CrCl cannot be calculated (Unknown ideal weight.). Liver Function Tests: No results for input(s): AST, ALT, ALKPHOS, BILITOT, PROT, ALBUMIN in the last 168 hours. No results for input(s): LIPASE, AMYLASE in the last 168 hours. No results for input(s): AMMONIA in the last 168 hours. Coagulation Profile: Recent Labs  Lab 03/10/19 1032  INR 1.4*   Cardiac Enzymes: No results for input(s): CKTOTAL, CKMB, CKMBINDEX, TROPONINI in the last 168 hours. BNP (last 3 results) No results for input(s): PROBNP in the last 8760 hours. HbA1C: No results for input(s): HGBA1C in the last 72 hours. CBG: No results for input(s): GLUCAP in the last 168 hours. Lipid Profile: No results for input(s): CHOL, HDL, LDLCALC, TRIG, CHOLHDL, LDLDIRECT in the last 72 hours. Thyroid Function Tests: No results for input(s): TSH, T4TOTAL, FREET4, T3FREE, THYROIDAB in the last 72 hours. Anemia Panel: Recent Labs    03/10/19 0723  VITAMINB12 618  FOLATE 15.1  FERRITIN 14  TIBC 448  IRON 18*  RETICCTPCT 1.9   Sepsis Labs: No results for input(s): PROCALCITON, LATICACIDVEN in the last 168 hours.  Recent Results (from the past 240 hour(s))  SARS Coronavirus 2     Status: None   Collection Time: 03/10/19  6:14 AM  Result Value Ref Range Status   SARS Coronavirus 2 NOT  DETECTED NOT DETECTED Final    Comment: (NOTE) SARS-CoV-2 target nucleic acids are NOT DETECTED. The SARS-CoV-2 RNA is generally detectable in upper and lower respiratory specimens during the acute phase of infection.  Negative  results do not preclude SARS-CoV-2 infection, do not rule out co-infections with other pathogens, and should not be used as the sole basis for treatment or other patient management decisions.  Negative results must be combined with clinical observations, patient history, and epidemiological information. The expected result is Not Detected. Fact Sheet for Patients: http://www.biofiredefense.com/wp-content/uploads/2020/03/BIOFIRE-COVID -19-patients.pdf Fact Sheet for Healthcare Providers: http://www.biofiredefense.com/wp-content/uploads/2020/03/BIOFIRE-COVID -19-hcp.pdf This test is not yet approved or cleared by the [place] and  has been authorized for detection and/or diagnosis of SARS-CoV-2 by FDA under an Emergency Use Authorization (EUA).  This EUA will remain in effec t (meaning this test can be used) for the duration of  the COVID-19 declaration under Section 564(b)(1) of the Act, 21 U.S.C. section 360bbb-3(b)(1), unless the authorization is terminated or revoked sooner. Performed at [facility], [address], [place], [place] [address]      Radiology Studies: No results found.  Scheduled Meds:  chlorhexidine  60 mL Topical Once   [MAR Hold] cholecalciferol  800 Units Oral Daily   povidone-iodine  2 application Topical Once   [MAR Hold] senna  1  tablet Oral BID   Continuous Infusions:  sodium chloride 125 mL/hr at 03/10/19 4696    ceFAZolin (ANCEF) IV     lactated ringers 10 mL/hr at 03/10/19 1032     LOS: 0 days   Time spent: Greater than 30 minutes  Azucena Fallen, DO [facility]  If 7PM-7AM, please contact night-coverage [email or web address] Password [facility] 03/10/2019, 11:28 AM

## 2019-03-10 NOTE — Anesthesia Preprocedure Evaluation (Addendum)
Anesthesia Evaluation  Patient identified by MRN, date of birth, ID band Patient confused    Reviewed: Allergy & Precautions, NPO status , Patient's Chart, lab work & pertinent test results, reviewed documented beta blocker date and time   Airway Mallampati: II  TM Distance: >3 FB Neck ROM: Full    Dental  (+) Dental Advisory Given   Pulmonary asthma , sleep apnea ,    breath sounds clear to auscultation       Cardiovascular hypertension, Pt. on medications and Pt. on home beta blockers  Rhythm:Regular Rate:Normal     Neuro/Psych  Headaches, Anxiety Depression Schizophrenia    GI/Hepatic Neg liver ROS, GERD  ,  Endo/Other  negative endocrine ROS  Renal/GU Renal InsufficiencyRenal disease     Musculoskeletal  (+) Arthritis ,   Abdominal   Peds  Hematology  (+) anemia ,   Anesthesia Other Findings   Reproductive/Obstetrics                             Lab Results  Component Value Date   WBC 11.9 (H) 03/10/2019   HGB 9.4 (L) 03/10/2019   HCT 29.2 (L) 03/10/2019   MCV 80.7 03/10/2019   PLT 213 03/10/2019   Lab Results  Component Value Date   CREATININE 0.81 03/10/2019   BUN 15 03/10/2019   NA 137 03/10/2019   K 3.6 03/10/2019   CL 98 03/10/2019   CO2 28 03/10/2019    Anesthesia Physical Anesthesia Plan  ASA: III  Anesthesia Plan: General   Post-op Pain Management:    Induction: Intravenous  PONV Risk Score and Plan: 3 and Dexamethasone, Ondansetron and Treatment may vary due to age or medical condition  Airway Management Planned: Oral ETT  Additional Equipment:   Intra-op Plan:   Post-operative Plan: Extubation in OR  Informed Consent: I have reviewed the patients History and Physical, chart, labs and discussed the procedure including the risks, benefits and alternatives for the proposed anesthesia with the patient or authorized representative who has indicated  his/her understanding and acceptance.   Patient has DNR.  Discussed DNR with power of attorney and Suspend DNR.   Dental advisory given  Plan Discussed with: CRNA  Anesthesia Plan Comments:        Anesthesia Quick Evaluation

## 2019-03-10 NOTE — Progress Notes (Signed)
Per Stephanie Acre, RN at [facility], pt's daughter, Kamyra Schroeck, is pt's emergency contact for consent forms.   Informed and blood consents obtained from [facility] via telephone [phone]. All questions answered to satisfaction.   Pre-op report called and given to Ailene Ravel, RN in [facility] All questions answered to satisfaction. OR personnel escorted pt off the unit via bed.

## 2019-03-10 NOTE — H&P (Signed)
History and Physical    [name] [name]:[id number] DOB: [birth year] DOA: 03/10/2019  PCP: [facility] Patient coming from: [facility] ED  Chief Complaint: Fall, left knee pain  HPI: [name] [name] is a 78 y.o. female with medical history significant of dementia, CHF, hypertension, hyperlipidemia, COPD, OSA presented to [facility] ED from her nursing facility after a witnessed fall.  Found to have left distal femur fracture on imaging.  Dr. Charlann Boxer from orthopedics was consulted and recommended transfer to [facility].  Orthopedics will consult in a.m. Patient is oriented to self only.  Appears uncomfortable and complaining of pain in her left knee.  No additional history could be obtained from her.  ED Course at [facility]:  [facility] count 12.5, hemoglobin 10.4, platelet count 236,000 Sodium 136, potassium 4.2, chloride 97, bicarb 25, BUN 14, creatinine 1.6, glucose 153 LFTs normal INR 1.1 25 hydroxy vitamin D level 29.4 COVID-19 rapid test negative CT of left lower extremity without contrast: Comminuted impacted C1/C3 fracture of the distal femur. X-ray of left knee: Moderately impacted supracondylar fracture of the distal femur. X-ray of left hip/pelvis: No acute hip or pelvic fracture identified. X-ray of left femur: Impacted distal femur fracture. CT head negative for acute finding. CT C-spine negative for acute finding. Chest x-ray: No acute cardiopulmonary findings. Knee immobilizer placed in the ED.  Review of Systems:  All systems reviewed and apart from history of presenting illness, are negative.  Past Medical History:  Diagnosis Date  . Anxiety   . Arthritis    "joints" (03/30/2013)  . Asthma   . Chest pain, exertional   . Chronic bronchitis (HCC)   . Chronic pain    "over my whole body" (03/30/2013)  . Complication of anesthesia    "because I have sleep apnea" (03/30/2013)  . Daily headache    "last 2 months" (03/30/2013)  . Depression   .  DVT (deep venous thrombosis) (HCC)    per 01/17/10 d/c summary- "remote hx of dvt"?  . Exertional shortness of breath    "& sometimes when laying down" (03/30/2013)  . GERD (gastroesophageal reflux disease)   . Glaucoma   . Heart murmur   . Hemorrhoids   . Hyperlipidemia   . Hypertension   . Iron deficiency anemia   . Pneumonia    "a few times" (03/30/2013)  . Psoriasis 04/29/2012   New onset evaluated by Dr Marylou Flesher, Dermatology, [facility] 6/13  Rx 0.1% Tacrolimus ointment  . Psoriasis   . Pulmonary embolism (HCC) 12/2009   Large central bilateral PE's   . Schizophrenia (HCC)   . Sleep apnea    "waiting on my CPAP" (03/30/2013)  . Varicose veins of legs     Past Surgical History:  Procedure Laterality Date  . CARPAL TUNNEL RELEASE Bilateral   . DILATION AND CURETTAGE OF UTERUS  1970's   "once" (03/30/2013)  . JOINT REPLACEMENT    . SPINAL FUSION     2004  . TOTAL KNEE ARTHROPLASTY Right 11/2008  . TUBAL LIGATION  1973  . VEIN SURGERY  left leg     reports that she has never smoked. She has never used smokeless tobacco. She reports that she does not drink alcohol or use drugs.  No Known Allergies  Family History  Problem Relation Age of Onset  . Diabetes Sister   . Colon cancer Brother 40  . Colon cancer Brother     Prior to Admission medications  Medication Sig Start Date End Date Taking? Authorizing Provider  acetaminophen (TYLENOL) 500 MG tablet Take 1 tablet (500 mg total) by mouth every 6 (six) hours as needed. Patient taking differently: Take 500 mg by mouth every 6 (six) hours as needed for mild pain.  08/28/15   Cheri Fowler, PA-C  albuterol (PROVENTIL HFA;VENTOLIN HFA) 108 (90 BASE) MCG/ACT inhaler Inhale 2 puffs into the lungs every 6 (six) hours as needed for wheezing or shortness of breath (wheezing). 12/08/14   Tyrone Nine, MD  amLODipine (NORVASC) 10 MG tablet Take 10 mg by mouth daily.    [provider]  atenolol (TENORMIN) 25 MG tablet Take 25  mg by mouth daily.    [provider]  atorvastatin (LIPITOR) 10 MG tablet Take 10 mg by mouth at bedtime.    [provider]  busPIRone (BUSPAR) 7.5 MG tablet Take 1 tablet (7.5 mg total) by mouth 2 (two) times daily. 10/02/15   Charm Rings, NP  cephALEXin (KEFLEX) 500 MG capsule Take 1 capsule (500 mg total) by mouth every 6 (six) hours. 09/26/15   Charm Rings, NP  cyclobenzaprine (FLEXERIL) 10 MG tablet Take 1 tablet (10 mg total) by mouth 2 (two) times daily as needed for muscle spasms. 08/20/15   Elpidio Anis, PA-C  esomeprazole (NEXIUM) 40 MG capsule Take 1 capsule (40 mg total) by mouth daily. 09/07/15   Roxy Horseman, PA-C  FLUoxetine (PROZAC) 40 MG capsule Take 1 capsule (40 mg total) by mouth daily. 10/02/15   Charm Rings, NP  fluticasone (FLOVENT HFA) 220 MCG/ACT inhaler Inhale 2 puffs into the lungs daily.  09/16/15   [provider]  Fluticasone Propionate, Inhal, (FLOVENT DISKUS) 250 MCG/BLIST AEPB Inhale 2 puffs into the lungs every 4 (four) hours as needed (for shortness of breath and coughing).    [provider]  furosemide (LASIX) 20 MG tablet Take 1 tablet (20 mg total) by mouth daily. 10/29/14   Tyrone Nine, MD  gabapentin (NEURONTIN) 300 MG capsule Take 1 capsule (300 mg total) by mouth 3 (three) times daily. 10/02/15   Charm Rings, NP  hydrOXYzine (VISTARIL) 25 MG capsule Take 1 capsule (25 mg total) by mouth daily as needed for anxiety. 10/02/15   Charm Rings, NP  meclizine (ANTIVERT) 25 MG tablet Take 1 tablet (25 mg total) by mouth 3 (three) times daily as needed for dizziness. 02/01/15   Arby Barrette, MD  mometasone [facility]) 220 MCG/INH inhaler Inhale 2 puffs into the lungs daily.    [provider]  potassium chloride SA (K-DUR,KLOR-CON) 20 MEQ tablet Take 20 mEq by mouth daily.    [provider]  silver sulfADIAZINE (SILVADENE) 1 % cream APPLY TOPICALLY EVERY DAY 12/10/14   Tyrone Nine, MD  traMADol  (ULTRAM) 50 MG tablet Take 50 mg by mouth 3 (three) times daily.    [provider]  traZODone (DESYREL) 100 MG tablet Take 1 tablet (100 mg total) by mouth at bedtime. 10/02/15   Charm Rings, NP  triamcinolone cream (KENALOG) 0.1 % Apply 1 application topically 2 (two) times daily as needed (for legs). 10/29/14   Tyrone Nine, MD  warfarin (COUMADIN) 5 MG tablet TAKE 1 TABLET BY MOUTH EVERY DAY Patient taking differently: TAKE 1.5  TABLETS (7.5 mg)  BY MOUTH EVERY DAY 10/18/14   Levert Feinstein, MD    Physical Exam: There were no vitals filed for this visit.  Physical Exam  Constitutional: She appears well-developed and well-nourished. She appears distressed.  Slightly distressed secondary to pain  HENT:  Head: Normocephalic.  Mouth/Throat: Oropharynx is clear and moist.  Eyes: Right eye exhibits no discharge. Left eye exhibits no discharge.  Neck: Neck supple.  Cardiovascular: Normal rate, regular rhythm and intact distal pulses.  Pulmonary/Chest: Effort normal and breath sounds normal. No respiratory distress. She has no wheezes. She has no rales.  Anterior lung fields clear to auscultation  Abdominal: Soft. Bowel sounds are normal. She exhibits no distension. There is no abdominal tenderness. There is no guarding.  Musculoskeletal:        General: No edema.     Comments: Left lower extremity: Knee immobilizer in place Dorsalis pedis pulse intact bilaterally  Neurological:  Awake and alert Oriented to self only  Skin: Skin is warm and dry. She is not diaphoretic.     Labs on Admission: I have personally reviewed following labs and imaging studies  CBC: No results for input(s): WBC, NEUTROABS, HGB, HCT, MCV, PLT in the last 168 hours. Basic Metabolic Panel: No results for input(s): NA, K, CL, CO2, GLUCOSE, BUN, CREATININE, CALCIUM, MG, PHOS in the last 168 hours. GFR: CrCl cannot be calculated (Patient's most recent lab result is older than the maximum 21 days  allowed.). Liver Function Tests: No results for input(s): AST, ALT, ALKPHOS, BILITOT, PROT, ALBUMIN in the last 168 hours. No results for input(s): LIPASE, AMYLASE in the last 168 hours. No results for input(s): AMMONIA in the last 168 hours. Coagulation Profile: No results for input(s): INR, PROTIME in the last 168 hours. Cardiac Enzymes: No results for input(s): CKTOTAL, CKMB, CKMBINDEX, TROPONINI in the last 168 hours. BNP (last 3 results) No results for input(s): PROBNP in the last 8760 hours. HbA1C: No results for input(s): HGBA1C in the last 72 hours. CBG: No results for input(s): GLUCAP in the last 168 hours. Lipid Profile: No results for input(s): CHOL, HDL, LDLCALC, TRIG, CHOLHDL, LDLDIRECT in the last 72 hours. Thyroid Function Tests: No results for input(s): TSH, T4TOTAL, FREET4, T3FREE, THYROIDAB in the last 72 hours. Anemia Panel: No results for input(s): VITAMINB12, FOLATE, FERRITIN, TIBC, IRON, RETICCTPCT in the last 72 hours. Urine analysis:    Component Value Date/Time   COLORURINE YELLOW 10/12/2015 1407   APPEARANCEUR CLOUDY (A) 10/12/2015 1407   LABSPEC 1.023 10/12/2015 1407   PHURINE 6.5 10/12/2015 1407   GLUCOSEU NEGATIVE 10/12/2015 1407   HGBUR NEGATIVE 10/12/2015 1407   BILIRUBINUR NEGATIVE 10/12/2015 1407   KETONESUR NEGATIVE 10/12/2015 1407   PROTEINUR 30 (A) 10/12/2015 1407   UROBILINOGEN 0.2 08/01/2015 1600   NITRITE NEGATIVE 10/12/2015 1407   LEUKOCYTESUR TRACE (A) 10/12/2015 1407    Radiological Exams on Admission: No results found.  EKG: EKG has been ordered and pending at this time.  Assessment/Plan Principal Problem:   Femur fracture, left (HCC) Active Problems:   Leukocytosis   Anemia   Vitamin D deficiency   HTN (hypertension)   Comminuted impacted fracture of the distal left femur secondary to a witnessed fall at nursing home -Orthopedics will consult in a.m. -Keep n.p.o. IV fluid hydration. -Pain management: Morphine 1 mg  every 3 hours as needed, Norco 5-325 mg 1 to 2 tablets every 6 hours as needed -Knee immobilizer in place -Bedrest  Mild leukocytosis Afebrile.  White count mildly elevated at 12.5.  Chest x-ray done at outside hospital without evidence of pneumonia. -Check UA -Repeat CBC  Anemia Hemoglobin 10.4, no recent baseline. -Check FOBT -Anemia  panel  Vitamin D deficiency 25 hydroxy vitamin D level checked at outside hospital 29.4. -Vitamin D 800 IU daily  OSA -CPAP at night  Hypertension -Hydralazine PRN  COPD -Not wheezing or hypoxic.  DuoNebs PRN.  CHF -Currently euvolemic on exam.  Unable to safely order home medications at this time as pharmacy medication reconciliation is pending.  DVT prophylaxis: SCDs Code Status: Full code.  No records from the patient's nursing home available at this time.  Please obtain records from her nursing home in the morning. Family Communication: No family available at this time. Disposition Plan: Anticipate discharge after clinical improvement. Consults called: Orthopedics Admission status: It is my clinical opinion that admission to INPATIENT is reasonable and necessary in this 78 y.o. female . presenting with symptoms of left knee pain, concerning for left distal femur fracture . in the context of PMH including: Dementia . and pertinent positives on radiographic and laboratory data including: Imaging with evidence of left distal femur fracture. . Workup and treatment include IV pain medications.  Orthopedic evaluation in the morning for likely surgery.  Given the aforementioned, the predictability of an adverse outcome is felt to be significant. I expect that the patient will require at least 2 midnights in the hospital to treat this condition.   The medical decision making on this patient was of high complexity and the patient is at high risk for clinical deterioration, therefore this is a level 3 visit.  John Giovanni MD [facility] Pager [phone]  If 7PM-7AM, please contact night-coverage [email or web address] Password [facility]  03/10/2019, 5:17 AM

## 2019-03-10 NOTE — Brief Op Note (Signed)
03/10/2019  6:03 PM  PATIENT:  [name] [name]  78 y.o. female  PRE-OPERATIVE DIAGNOSIS:  Left Distal Femur Fracture with Intracondylar Extension  POST-OPERATIVE DIAGNOSIS:  Left Distal Femur Fracture with Intracondylar Extension  PROCEDURE:  Procedure(s): OPEN REDUCTION INTERNAL FIXATION (ORIF) DISTAL FEMUR FRACTURE (Left) with Intracondylar Extension  SURGEON:  Surgeon(s) and Role:    Myrene Galas* Nicholi Ghuman, MD - Primary  ASSISTANTS: None   ANESTHESIA:   general  EBL:  50 mL   BLOOD ADMINISTERED:none  DRAINS: none   LOCAL MEDICATIONS USED:  NONE  SPECIMEN:  No Specimen  DISPOSITION OF SPECIMEN:  N/A  COUNTS:  YES  TOURNIQUET:  * No tourniquets in log *  DICTATION: .Note written in EPIC  PLAN OF CARE: Admit to inpatient   PATIENT DISPOSITION:  PACU - hemodynamically stable.   Delay start of Pharmacological VTE agent (>24hrs) due to surgical blood loss or risk of bleeding: no  BRIEF SUMMARY OF INDICATION FOR PROCEDURE:  [name] [name] is a pleasant 78 y.o. who sustained extremity trauma in ground level fall. The risks and benefits of surgery were discussed with the family including the possibility of infection, nerve injury, vessel injury, DVT/ PE, loss of motion, arthritis, symptomatic hardware, malunion, nonunion, and need for further surgery among others.  After full discussion, consent was given to proceed by the daughter who also discussed Code Status with Dr. Sampson GoonFitzgerald.  BRIEF SUMMARY OF PROCEDURE:  The patient was taken to the operating room where general anesthesia was induced.  The left lower extremity was prepped and draped in usual sterile fashion.  No tourniquet was used during the procedure. C-arm was brought in to mark the starting point on AP and lateral images distally.  Incision was then made.  Dissection was carried carefully down to the retinaculum, which was incised and divided.  The joint and intraarticular fracture were lavaged and hematoma  evacuated. Traction and angular manipulation was performed to straighten the knee joint and obtain proper reduction and alignment. The radiolucent triangle and bumps were used to fine tune alignment and translation on the AP and lateral views.  We were careful to watch for rotation throughout.  The 9-hole Biomet NCB plate was then advanced proximally.  Once we were satisfied with position on AP and lateral images, a pin was placed distally and then a single screw proximally.  This was followed by additional K-wire fixation and additional screw.  We then brought the knee into full extension and checked the rotation and alignment dialing this in.  This was followed by additional bicortical screw fixation proximally such that we ended with 4 bicortical screws and 5 screws into the supracondylar region distally.  I could visualize the articular surface and all screws were extra-articular.  Wounds were irrigated thoroughly.  C-arm was brought in to confirm appropriate reduction, hardware placement, trajectory and length.  I then evaluated the knee joint under live fluoro allowing both posterior stress, which showed full extension without translation. All wounds were irrigated thoroughly and then closed in standard layered fashion using #1 Vicryl, #0 Vicryl, 2-0 Vicryl, and 2-0 nylon.  A gently compressive dressing was applied from foot to thigh and then knee immobilizer.The patient was taken to PACU in stable condition.   PROGNOSIS:  The patient has preexistent arthritis which may limit mobility and recovery from this injury, and increase the risks of loss of motion and arthritis. Patient will be nonweightbearing on the operative extremity with early mobilization encouraged. Pharmacologic DVT prophylaxis will  be with Lovenox, particularly given her PE/ DVT history. She will not require bracing and could partial weight bear for transfers.    Astrid Divine. Marcelino Scot, M.D.

## 2019-03-11 ENCOUNTER — Encounter (HOSPITAL_COMMUNITY): Payer: Self-pay | Admitting: Orthopedic Surgery

## 2019-03-11 LAB — CBC
HCT: 26.6 % — ABNORMAL LOW (ref 36.0–46.0)
Hemoglobin: 8.5 g/dL — ABNORMAL LOW (ref 12.0–15.0)
MCH: 26.2 pg (ref 26.0–34.0)
MCHC: 32 g/dL (ref 30.0–36.0)
MCV: 82.1 fL (ref 80.0–100.0)
Platelets: 208 10*3/uL (ref 150–400)
RBC: 3.24 MIL/uL — ABNORMAL LOW (ref 3.87–5.11)
RDW: 18.8 % — ABNORMAL HIGH (ref 11.5–15.5)
WBC: 13 10*3/uL — ABNORMAL HIGH (ref 4.0–10.5)
nRBC: 0 % (ref 0.0–0.2)

## 2019-03-11 LAB — BASIC METABOLIC PANEL
Anion gap: 12 (ref 5–15)
BUN: 14 mg/dL (ref 8–23)
CO2: 26 mmol/L (ref 22–32)
Calcium: 9 mg/dL (ref 8.9–10.3)
Chloride: 100 mmol/L (ref 98–111)
Creatinine, Ser: 0.79 mg/dL (ref 0.44–1.00)
GFR calc Af Amer: >60 mL/min (ref >60)
GFR calc non Af Amer: >60 mL/min (ref >60)
Glucose, Bld: 128 mg/dL — ABNORMAL HIGH (ref 70–99)
Potassium: 3.7 mmol/L (ref 3.5–5.1)
Sodium: 138 mmol/L (ref 135–145)

## 2019-03-11 LAB — PROTIME-INR
INR: 1.3 — ABNORMAL HIGH (ref 0.8–1.2)
Prothrombin Time: 15.8 seconds — ABNORMAL HIGH (ref 11.4–15.2)

## 2019-03-11 MED ORDER — RIVAROXABAN 20 MG PO TABS
20.0000 mg | ORAL_TABLET | Freq: Every day | ORAL | Status: DC
  Administered 2019-03-11: 20 mg via ORAL
  Filled 2019-03-11: qty 1

## 2019-03-11 NOTE — Anesthesia Postprocedure Evaluation (Signed)
Anesthesia Post Note  Patient: [name] [name]  Procedure(s) Performed: OPEN REDUCTION INTERNAL FIXATION (ORIF) DISTAL FEMUR FRACTURE (Left Leg Upper)     Patient location during evaluation: PACU Anesthesia Type: General Level of consciousness: awake and alert and confused Pain management: pain level controlled Vital Signs Assessment: post-procedure vital signs reviewed and stable Respiratory status: spontaneous breathing, nonlabored ventilation, respiratory function stable and patient connected to nasal cannula oxygen Cardiovascular status: blood pressure returned to baseline and stable Postop Assessment: no apparent nausea or vomiting Anesthetic complications: no    Last Vitals:  Vitals:   03/11/19 0805 03/11/19 1439  BP: (!) 147/88 101/72  Pulse: (!) 109 92  Resp: 14 16  Temp: 36.5 C 37.8 C  SpO2: 100% 93%    Last Pain:  Vitals:   03/11/19 1439  TempSrc: Oral                 Tiajuana Amass

## 2019-03-11 NOTE — TOC Initial Note (Signed)
Transition of Care [facility]) - Initial/Assessment Note    Patient Details  Name: [name] [name] MRN: [id number] Date of Birth: 1940-11-22  Transition of Care [facility]) CM/SW Contact:    Alberteen Sam, [place] Phone Number: [phone] 03/11/2019, 10:54 AM  Clinical Narrative:                  CSW attempted to contact patient's husband [name] on both phone numbers listed, appear to be disconnected. CSW contacted patient's sister Pamala Hurry to consult regarding discharge planning. Pamala Hurry in agreement with patient returning to [facility] where she has been living. Pamala Hurry reports being aware of the no visitor policy still in place. She reports also having difficulty reaching [name] but states she will let him know patient will be discharging back to [place] today if she speaks with him. No further questions or concerns at this time.   Expected Discharge Plan: Skilled Nursing Facility Barriers to Discharge: Continued Medical Work up   Patient Goals and CMS Choice   CMS Medicare.gov Compare Post Acute Care list provided to:: Patient Represenative (must comment)(Barbara (sister)) Choice offered to / list presented to : Sibling(Barbara (sister))  Expected Discharge Plan and Services Expected Discharge Plan: [place] Acute Care Choice: [place] Living arrangements for the past 2 months: Skilled Nursing Facility([facility])                                      Prior Living Arrangements/Services Living arrangements for the past 2 months: Skilled Nursing Facility([facility]) Lives with:: Self Patient language and need for interpreter reviewed:: Yes        Need for Family Participation in Patient Care: Yes (Comment) Care giver support system in place?: Yes (comment)   Criminal Activity/Legal Involvement Pertinent to Current Situation/Hospitalization: No - Comment as needed  Activities of Daily Living Home  Assistive Devices/Equipment: None ADL Screening (condition at time of admission) Patient's cognitive ability adequate to safely complete daily activities?: No Is the patient deaf or have difficulty hearing?: No Does the patient have difficulty seeing, even when wearing glasses/contacts?: No Does the patient have difficulty concentrating, remembering, or making decisions?: Yes Patient able to express need for assistance with ADLs?: Yes Does the patient have difficulty dressing or bathing?: Yes Independently performs ADLs?: No Communication: Needs assistance Is this a change from baseline?: Pre-admission baseline Dressing (OT): Needs assistance Is this a change from baseline?: Pre-admission baseline Grooming: Needs assistance Is this a change from baseline?: Pre-admission baseline Feeding: Needs assistance Is this a change from baseline?: Pre-admission baseline Bathing: Needs assistance Is this a change from baseline?: Pre-admission baseline Toileting: Needs assistance Is this a change from baseline?: Pre-admission baseline In/Out Bed: Needs assistance Is this a change from baseline?: Pre-admission baseline Walks in Home: Dependent Is this a change from baseline?: Pre-admission baseline Does the patient have difficulty walking or climbing stairs?: Yes Weakness of Legs: Left Weakness of Arms/Hands: Both  Permission Sought/Granted Permission sought to share information with : Case Manager, Family Supports, Customer service manager Permission granted to share information with : Yes, Verbal Permission Granted  Share Information with NAME: Pamala Hurry  Permission granted to share info w AGENCY: SNFs  Permission granted to share info w Relationship: sister  Permission granted to share info w Contact Information: [phone]  Emotional Assessment Appearance:: Appears stated age Attitude/Demeanor/Rapport: Unable to  Assess Affect (typically observed): Unable to Assess Orientation: :  Oriented to Self Alcohol / Substance Use: Not Applicable Psych Involvement: No (comment)  Admission diagnosis:  Femur fracture, left (HCC) [S72.92XA] CHF (congestive heart failure) (HCC) [I50.9] Patient Active Problem List   Diagnosis Date Noted  . Femur fracture, left (HCC) 03/10/2019  . Leukocytosis 03/10/2019  . Anemia 03/10/2019  . Vitamin D deficiency 03/10/2019  . HTN (hypertension) 03/10/2019  . Adjustment disorder with depressed mood 10/01/2015  . Major depressive disorder, recurrent episode, mild (HCC) 09/26/2015  . Encounter for preadmission testing   . GAD (generalized anxiety disorder) 09/09/2015  . Major depressive disorder, recurrent, severe without psychotic features (HCC) 04/26/2015  . Osteoarthritis of left knee 10/29/2014  . Back pain 06/09/2013  . Vertigo 05/31/2012  . Psoriasis 04/29/2012  . Chronic anticoagulation 12/11/2011  . Heterozygous factor V Leiden mutation (HCC) 01/18/2011  . Anemia, normocytic normochromic 01/18/2011  . GERD 12/21/2010  . Family history of malignant neoplasm of gastrointestinal tract 12/21/2010  . Iron deficiency anemia, unspecified 12/21/2010  . Varicose veins of both lower extremities with pain 12/15/2010  . Long term current use of anticoagulant therapy 11/30/2010  . History of pulmonary embolism 01/30/2010  . Dyslipidemia 01/10/2010  . Asthma 01/10/2010  . Obesity 09/26/2009  . Major depressive disorder with psychotic features (HCC) 09/26/2009  . Chronic pain syndrome 09/26/2009  . Essential hypertension, benign 09/26/2009   PCP:  [facility], [facility]:   [facility] [place] [place] - [address] [place] [address] Phone: [phone] Fax: [phone]  [id number] [place], [place] - [address] [facility] [address] [place] [address] Phone: [phone] Fax: [phone]  [facility] [id number] [place], [place]  - [id number] [facility] [address] [facility] [address] [place] [address] Phone: [phone] Fax: [phone]     Social Determinants of Health (SDOH) Interventions    Readmission Risk Interventions No flowsheet data found.

## 2019-03-11 NOTE — [place] FL2 (Signed)
v [place] MEDICAID FL2 LEVEL OF CARE SCREENING TOOL     IDENTIFICATION  Patient Name: [name] SnowballDora C [name] Birthdate: 11/25/1940 Sex: female Admission Date (Current Location): 03/10/2019  [facility]  [employer]   Facility and Address:  [facility] [place], [address], [place], [place] [address]      Provider Number: [id number]  Attending Physician Name and Address:  Azucena FallenLancaster, William C, MD  Relative Name and Phone Number:  Marilu FavreClarence ([phone]    Current Level of Care: Hospital Recommended Level of Care: Skilled Nursing Facility Prior Approval Number:    Date Approved/Denied: 02/25/17 PASRR Number: [id number]  Discharge Plan: SNF    Current Diagnoses: Patient Active Problem List   Diagnosis Date Noted  . Femur fracture, left (HCC) 03/10/2019  . Leukocytosis 03/10/2019  . Anemia 03/10/2019  . Vitamin D deficiency 03/10/2019  . HTN (hypertension) 03/10/2019  . Adjustment disorder with depressed mood 10/01/2015  . Major depressive disorder, recurrent episode, mild (HCC) 09/26/2015  . Encounter for preadmission testing   . GAD (generalized anxiety disorder) 09/09/2015  . Major depressive disorder, recurrent, severe without psychotic features (HCC) 04/26/2015  . Osteoarthritis of left knee 10/29/2014  . Back pain 06/09/2013  . Vertigo 05/31/2012  . Psoriasis 04/29/2012  . Chronic anticoagulation 12/11/2011  . Heterozygous factor V Leiden mutation (HCC) 01/18/2011  . Anemia, normocytic normochromic 01/18/2011  . GERD 12/21/2010  . Family history of malignant neoplasm of gastrointestinal tract 12/21/2010  . Iron deficiency anemia, unspecified 12/21/2010  . Varicose veins of both lower extremities with pain 12/15/2010  . Long term current use of anticoagulant therapy 11/30/2010  . History of pulmonary embolism 01/30/2010  . Dyslipidemia 01/10/2010  . Asthma 01/10/2010  . Obesity 09/26/2009  . Major depressive disorder with  psychotic features (HCC) 09/26/2009  . Chronic pain syndrome 09/26/2009  . Essential hypertension, benign 09/26/2009    Orientation RESPIRATION BLADDER Height & Weight     Self  O2(nasal cannula 2L/min, also on CPAP medium adult nasal mask flow rate of 2) Incontinent, External catheter Weight: 235 lb 10.8 oz (106.9 kg) Height:  5\' 5"  (165.1 cm)  BEHAVIORAL SYMPTOMS/MOOD NEUROLOGICAL BOWEL NUTRITION STATUS      Continent Diet(see discharge summary)  AMBULATORY STATUS COMMUNICATION OF NEEDS Skin   Extensive Assist(maxislide used) Verbally Other (Comment), Surgical wounds(left leg closed surgical incision)                       Personal Care Assistance Level of Assistance  Bathing, Feeding, Total care Bathing Assistance: Maximum assistance Feeding assistance: Maximum assistance   Total Care Assistance: Maximum assistance   Functional Limitations Info  Sight, Hearing, Speech Sight Info: Adequate Hearing Info: Adequate Speech Info: Adequate    SPECIAL CARE FACTORS FREQUENCY  PT (By licensed PT), OT (By licensed OT)     PT Frequency: min 5x weekly OT Frequency: min 5x weekly            Contractures Contractures Info: Not present    Additional Factors Info  Allergies, Code Status Code Status Info: DNR Allergies Info: No Known Allergies           Current Medications (03/11/2019):  This is the current hospital active medication list Current Facility-Administered Medications  Medication Dose Route Frequency Provider Last Rate Last Dose  . cholecalciferol (VITAMIN D3) tablet 800 Units  800 Units Oral Daily Montez Moritaaul, Keith, PA-C   800 Units at 03/11/19 1040  . docusate sodium (COLACE)  capsule 100 mg  100 mg Oral BID Ainsley Spinner, PA-C   100 mg at 03/11/19 1041  . hydrALAZINE (APRESOLINE) injection 5 mg  5 mg Intravenous Q4H PRN Ainsley Spinner, PA-C      . HYDROcodone-acetaminophen (NORCO/VICODIN) 5-325 MG per tablet 1-2 tablet  1-2 tablet Oral Q6H PRN Ainsley Spinner, PA-C       . ipratropium-albuterol (DUONEB) 0.5-2.5 (3) MG/3ML nebulizer solution 3 mL  3 mL Nebulization Q6H PRN Ainsley Spinner, PA-C      . lactated ringers infusion   Intravenous Continuous Ainsley Spinner, PA-C 10 mL/hr at 03/10/19 1032    . menthol-cetylpyridinium (CEPACOL) lozenge 3 mg  1 lozenge Oral PRN Ainsley Spinner, PA-C       Or  . phenol (CHLORASEPTIC) mouth spray 1 spray  1 spray Mouth/Throat PRN Ainsley Spinner, PA-C      . metoCLOPramide (REGLAN) tablet 5-10 mg  5-10 mg Oral Q8H PRN Ainsley Spinner, PA-C       Or  . metoCLOPramide (REGLAN) injection 5-10 mg  5-10 mg Intravenous Q8H PRN Ainsley Spinner, PA-C      . morphine 2 MG/ML injection 1 mg  1 mg Intravenous Q3H PRN Ainsley Spinner, PA-C   1 mg at 03/10/19 2336  . ondansetron (ZOFRAN) tablet 4 mg  4 mg Oral Q6H PRN Ainsley Spinner, PA-C       Or  . ondansetron [facility]) injection 4 mg  4 mg Intravenous Q6H PRN Ainsley Spinner, PA-C      . rivaroxaban [facility]) tablet 20 mg  20 mg Oral Q supper Little Ishikawa, MD      . senna [facility]) tablet 8.6 mg  1 tablet Oral BID Ainsley Spinner, PA-C   8.6 mg at 03/11/19 1040     Discharge Medications: Please see discharge summary for a list of discharge medications.  Relevant Imaging Results:  Relevant Lab Results:   Additional Information SSN: [id number]  Alberteen Sam, LCSW

## 2019-03-11 NOTE — Discharge Summary (Signed)
Physician Discharge Summary  [name] [name] WGN:[id number] DOB: 1940/11/01 DOA: 03/10/2019  PCP: [facility]  Admit date: 03/10/2019 Discharge date: 03/11/2019  Admitted From: SNF Disposition:  SNF  Recommendations for Outpatient Follow-up:  1. Follow up with PCP in 1-2 weeks 2. Please obtain BMP/CBC in one week  Discharge Condition: Stable CODE STATUS: DNR  Diet recommendation: Dysphagia 3, mechanical soft, thin liquids (Of note patient did not have her dentures here - would likely benefit from re-evaluation once wearing them).  Brief/Interim Summary: [name] Coste Johnsonis a 78 y.o.femalewith medical history significant ofdementia,CHF, hypertension, hyperlipidemia, COPD, OSA presented to [facility] ED from her nursing facility after a witnessed fall. Found to have left distal femur fracture on imaging. Dr. Charlann Boxer from orthopedics was consulted and recommended transfer to [facility]. Orthopedics will consult in a.m. Patient is oriented to self only. Appears uncomfortable and complaining of pain in her left knee.No additional history could be obtained from her.  Patient admitted as above after mechanical fall at SNF with subsequently noted left femur fracture now status post ORIF on 03/10/19. Tolerating PT well, ambulating with assistance, tolerating p.o., pain well controlled.  Patient otherwise stable and agreeable for discharge back to skilled nursing facility for ongoing PT and medical management per her PCP.  Discharge Diagnoses:  Principal Problem:   Femur fracture, left (HCC) Active Problems:   Leukocytosis   Anemia   Vitamin D deficiency   HTN (hypertension)  Comminuted impacted fracture of the distal left femur secondary to a mechanical fall at SNF - Orthopedics following - appreciate insight and recs - s/p ORIF 6/16 - Pain well managed currently - Knee immobilizer in place  Reactive leukocytosis, does not meet sepsis/sirs criteria - Follow with am  labs - no clear signs/symptoms of infection  Anemia, likely chronic anemia of chronic disease - Stable  Vitamin D deficiency - 25 hydroxy vitamin D levelchecked at outside hospital29.4. - Continue vitamin D 800 IU daily  ?OSA -Needs outpatient sleep study to evaluate  Hypertension - Hydralazine PRN  COPD -Not wheezing or hypoxic. DuoNebs PRN.  CHF -Currently euvolemic on exam.  Allergies as of 03/11/2019   No Known Allergies     Medication List    STOP taking these medications   albuterol 108 (90 Base) MCG/ACT inhaler Commonly known as: VENTOLIN HFA   busPIRone 7.5 MG tablet Commonly known as: BUSPAR   cephALEXin 500 MG capsule Commonly known as: KEFLEX   cyclobenzaprine 10 MG tablet Commonly known as: FLEXERIL   esomeprazole 40 MG capsule Commonly known as: NEXIUM   furosemide 20 MG tablet Commonly known as: LASIX   hydrOXYzine 25 MG capsule Commonly known as: VISTARIL   meclizine 25 MG tablet Commonly known as: ANTIVERT   silver sulfADIAZINE 1 % cream Commonly known as: SILVADENE   triamcinolone cream 0.1 % Commonly known as: KENALOG   warfarin 5 MG tablet Commonly known as: COUMADIN     TAKE these medications   acetaminophen 325 MG tablet Commonly known as: TYLENOL Take 650 mg by mouth 3 (three) times daily. For pain What changed: Another medication with the same name was removed. Continue taking this medication, and follow the directions you see here.   amantadine 100 MG capsule Commonly known as: SYMMETREL Take 100 mg by mouth 2 (two) times a day.   amLODipine 10 MG tablet Commonly known as: NORVASC Take 10 mg by mouth daily.   divalproex 500 MG DR tablet Commonly known as: DEPAKOTE Take 500  mg by mouth at bedtime.   divalproex 250 MG DR tablet Commonly known as: DEPAKOTE Take 250 mg by mouth every morning.   docusate sodium 100 MG capsule Commonly known as: COLACE Take 100 mg by mouth daily.   doxycycline 100 MG  capsule Commonly known as: VIBRAMYCIN Take 100 mg by mouth 2 (two) times daily. For 14 days Started on 6.11.20 Ends on 6.25.20   FLUoxetine 20 MG capsule Commonly known as: PROZAC Take 60 mg by mouth daily. Taking 3 capsules (20mg ) = 60mg  daily What changed: Another medication with the same name was removed. Continue taking this medication, and follow the directions you see here.   gabapentin 100 MG capsule Commonly known as: NEURONTIN Take 500 mg by mouth 3 (three) times daily. Taking 5 (100mg ) capsules three times daily What changed: Another medication with the same name was removed. Continue taking this medication, and follow the directions you see here.   guaiFENesin 600 MG 12 hr tablet Commonly known as: MUCINEX Take 600 mg by mouth 2 (two) times daily as needed for cough or to loosen phlegm.   hydrochlorothiazide 12.5 MG tablet Commonly known as: HYDRODIURIL Take 12.5 mg by mouth daily.   hydroxypropyl methylcellulose / hypromellose 2.5 % ophthalmic solution Commonly known as: ISOPTO TEARS / GONIOVISC Place 1 drop into both eyes 4 (four) times daily.   Incruse Ellipta 62.5 MCG/INH Aepb Generic drug: umeclidinium bromide Inhale 1 puff into the lungs daily.   LACTOBACILLUS PO Take 1 capsule by mouth daily.   Latuda 40 MG Tabs tablet Generic drug: lurasidone Take 40 mg by mouth daily.   Lidocaine 4 % Ptch Apply 1 patch topically daily.   metoprolol tartrate 25 MG tablet Commonly known as: LOPRESSOR Take 25 mg by mouth daily. morning   pantoprazole 20 MG tablet Commonly known as: PROTONIX Take 20 mg by mouth daily.   phenylephrine-shark liver oil-mineral oil-petrolatum 0.25-3-14-71.9 % rectal ointment Commonly known as: PREPARATION H Place 1 application rectally every 6 (six) hours as needed for hemorrhoids.   polyethylene glycol 17 g packet Commonly known as: MIRALAX / GLYCOLAX Take 17 g by mouth daily.   rivaroxaban 20 MG Tabs tablet Commonly known as:  XARELTO Take 20 mg by mouth daily with supper.   rivastigmine 9.5 mg/24hr Commonly known as: EXELON Place 9.5 mg onto the skin daily.   traMADol 50 MG tablet Commonly known as: ULTRAM Take 50 mg by mouth 3 (three) times daily.   traZODone 100 MG tablet Commonly known as: DESYREL Take 1 tablet (100 mg total) by mouth at bedtime. What changed: how much to take   Vitamin D (Ergocalciferol) 1.25 MG (50000 UT) Caps capsule Commonly known as: DRISDOL Take 50,000 Units by mouth every 14 (fourteen) days.       No Known Allergies  Consultations:  Orthopedic surgery; Dr. Carola Frost   Procedures/Studies: Dg Knee Left Port  Result Date: 03/10/2019 CLINICAL DATA:  Femur ORIF EXAM: PORTABLE LEFT KNEE - 1-2 VIEW COMPARISON:  None. FINDINGS: Status post placement of the distal left femur. Expected immediate postoperative appearance. The knee is approximated. Hardware along the lateral aspect IMPRESSION: Expected postoperative appearance of internal fixation of the lateral distal femur Electronically Signed   By: Deatra Robinson M.D.   On: 03/10/2019 19:42   Dg C-arm 1-60 Min  Result Date: 03/10/2019 CLINICAL DATA:  ORIF distal femur EXAM: DG C-ARM 61-120 MIN; LEFT FEMUR 2 VIEWS COMPARISON:  None. FINDINGS: Six fluoroscopic images were obtained during open reduction and  internal fixation of a fracture of the distal femur. Fluoroscopy time was reported as 59 seconds. IMPRESSION: Intraoperative fluoroscopy. Electronically Signed   By: Deatra Robinson M.D.   On: 03/10/2019 19:41   Dg Femur Min 2 Views Left  Result Date: 03/10/2019 CLINICAL DATA:  ORIF distal femur EXAM: DG C-ARM 61-120 MIN; LEFT FEMUR 2 VIEWS COMPARISON:  None. FINDINGS: Six fluoroscopic images were obtained during open reduction and internal fixation of a fracture of the distal femur. Fluoroscopy time was reported as 59 seconds. IMPRESSION: Intraoperative fluoroscopy. Electronically Signed   By: Deatra Robinson M.D.   On: 03/10/2019 19:41     Subjective: No acute issues or events overnight, tolerating p.o. well, requesting discharge back home which is certainly reasonable.   Discharge Exam: Vitals:   03/11/19 0350 03/11/19 0805  BP: (!) 148/88 (!) 147/88  Pulse: 88 (!) 109  Resp: 18 14  Temp: 98.6 F (37 C) 97.7 F (36.5 C)  SpO2: 100% 100%   Vitals:   03/10/19 2200 03/10/19 2316 03/11/19 0350 03/11/19 0805  BP:  (!) 141/85 (!) 148/88 (!) 147/88  Pulse: (!) 101 94 88 (!) 109  Resp: 16 18 18 14   Temp:  98.4 F (36.9 C) 98.6 F (37 C) 97.7 F (36.5 C)  TempSrc:  Oral Oral Oral  SpO2: 94% 99% 100% 100%  Weight:      Height:        General:  Pleasantly resting in bed, No acute distress. HEENT:  Normocephalic atraumatic.  Sclerae nonicteric, noninjected.  Extraocular movements intact bilaterally. Neck:  Without mass or deformity.  Trachea is midline. Lungs:  Clear to auscultate bilaterally without rhonchi, wheeze, or rales. Heart:  Regular rate and rhythm.  Without murmurs, rubs, or gallops. Abdomen:  Soft, nontender, nondistended.  Without guarding or rebound. Extremities: Without cyanosis, clubbing, edema, or obvious deformity. Vascular:  Dorsalis pedis and posterior tibial pulses palpable bilaterally. Skin:  Warm and dry, no erythema, no ulcerations.   The results of significant diagnostics from this hospitalization (including imaging, microbiology, ancillary and laboratory) are listed below for reference.     Microbiology: Recent Results (from the past 240 hour(s))  SARS Coronavirus 2     Status: None   Collection Time: 03/10/19  6:14 AM  Result Value Ref Range Status   SARS Coronavirus 2 NOT DETECTED NOT DETECTED Final    Comment: (NOTE) SARS-CoV-2 target nucleic acids are NOT DETECTED. The SARS-CoV-2 RNA is generally detectable in upper and lower respiratory specimens during the acute phase of infection.  Negative  results do not preclude SARS-CoV-2 infection, do not rule out co-infections with  other pathogens, and should not be used as the sole basis for treatment or other patient management decisions.  Negative results must be combined with clinical observations, patient history, and epidemiological information. The expected result is Not Detected. Fact Sheet for Patients: http://www.biofiredefense.com/wp-content/uploads/2020/03/BIOFIRE-COVID -19-patients.pdf Fact Sheet for Healthcare Providers: http://www.biofiredefense.com/wp-content/uploads/2020/03/BIOFIRE-COVID -19-hcp.pdf This test is not yet approved or cleared by the [place] and  has been authorized for detection and/or diagnosis of SARS-CoV-2 by FDA under an Emergency Use Authorization (EUA).  This EUA will remain in effec t (meaning this test can be used) for the duration of  the COVID-19 declaration under Section 564(b)(1) of the Act, 21 U.S.C. section 360bbb-3(b)(1), unless the authorization is terminated or revoked sooner. Performed at [facility], [address], [place], [place] [address]   Surgical pcr screen     Status: None  Collection Time: 03/10/19  8:23 AM   Specimen: Nasal Mucosa; Nasal Swab  Result Value Ref Range Status   MRSA, PCR NEGATIVE NEGATIVE Final   Staphylococcus aureus NEGATIVE NEGATIVE Final    Comment: (NOTE) The Xpert SA Assay (FDA approved for NASAL specimens in patients 77 years of age and older), is one component of a comprehensive surveillance program. It is not intended to diagnose infection nor to guide or monitor treatment. Performed at [facility], [address], [place], [place] [address]      Labs: BNP (last 3 results) No results for input(s): BNP in the last 8760 hours. Basic Metabolic Panel: Recent Labs  Lab 03/10/19 0500 03/11/19 0623  NA 137 138  K 3.6 3.7  CL 98 100  CO2 28 26  GLUCOSE 138* 128*  BUN 15 14  CREATININE 0.81 0.79  CALCIUM 9.4 9.0   Liver Function Tests: No results for input(s): AST, ALT, ALKPHOS, BILITOT,  PROT, ALBUMIN in the last 168 hours. No results for input(s): LIPASE, AMYLASE in the last 168 hours. No results for input(s): AMMONIA in the last 168 hours. CBC: Recent Labs  Lab 03/10/19 0500 03/11/19 0623  WBC 11.9* 13.0*  HGB 9.4* 8.5*  HCT 29.2* 26.6*  MCV 80.7 82.1  PLT 213 208   Cardiac Enzymes: No results for input(s): CKTOTAL, CKMB, CKMBINDEX, TROPONINI in the last 168 hours. BNP: Invalid input(s): POCBNP CBG: No results for input(s): GLUCAP in the last 168 hours. D-Dimer No results for input(s): DDIMER in the last 72 hours. Hgb A1c No results for input(s): HGBA1C in the last 72 hours. Lipid Profile No results for input(s): CHOL, HDL, LDLCALC, TRIG, CHOLHDL, LDLDIRECT in the last 72 hours. Thyroid function studies No results for input(s): TSH, T4TOTAL, T3FREE, THYROIDAB in the last 72 hours.  Invalid input(s): FREET3 Anemia work up Recent Labs    03/10/19 0723  VITAMINB12 618  FOLATE 15.1  FERRITIN 14  TIBC 448  IRON 18*  RETICCTPCT 1.9   Urinalysis    Component Value Date/Time   COLORURINE YELLOW 03/10/2019 0746   APPEARANCEUR HAZY (A) 03/10/2019 0746   LABSPEC 1.019 03/10/2019 0746   PHURINE 6.0 03/10/2019 0746   GLUCOSEU NEGATIVE 03/10/2019 0746   HGBUR SMALL (A) 03/10/2019 0746   BILIRUBINUR NEGATIVE 03/10/2019 0746   KETONESUR 5 (A) 03/10/2019 0746   PROTEINUR 30 (A) 03/10/2019 0746   UROBILINOGEN 0.2 08/01/2015 1600   NITRITE NEGATIVE 03/10/2019 0746   LEUKOCYTESUR NEGATIVE 03/10/2019 0746   Sepsis Labs Invalid input(s): PROCALCITONIN,  WBC,  LACTICIDVEN Microbiology Recent Results (from the past 240 hour(s))  SARS Coronavirus 2     Status: None   Collection Time: 03/10/19  6:14 AM  Result Value Ref Range Status   SARS Coronavirus 2 NOT DETECTED NOT DETECTED Final    Comment: (NOTE) SARS-CoV-2 target nucleic acids are NOT DETECTED. The SARS-CoV-2 RNA is generally detectable in upper and lower respiratory specimens during the acute  phase of infection.  Negative  results do not preclude SARS-CoV-2 infection, do not rule out co-infections with other pathogens, and should not be used as the sole basis for treatment or other patient management decisions.  Negative results must be combined with clinical observations, patient history, and epidemiological information. The expected result is Not Detected. Fact Sheet for Patients: http://www.biofiredefense.com/wp-content/uploads/2020/03/BIOFIRE-COVID -19-patients.pdf Fact Sheet for Healthcare Providers: http://www.biofiredefense.com/wp-content/uploads/2020/03/BIOFIRE-COVID -19-hcp.pdf This test is not yet approved or cleared by the [place] and  has been authorized for detection and/or diagnosis  of SARS-CoV-2 by FDA under an Emergency Use Authorization (EUA).  This EUA will remain in effec t (meaning this test can be used) for the duration of  the COVID-19 declaration under Section 564(b)(1) of the Act, 21 U.S.C. section 360bbb-3(b)(1), unless the authorization is terminated or revoked sooner. Performed at [facility], [address], [place], [place] [address]   Surgical pcr screen     Status: None   Collection Time: 03/10/19  8:23 AM   Specimen: Nasal Mucosa; Nasal Swab  Result Value Ref Range Status   MRSA, PCR NEGATIVE NEGATIVE Final   Staphylococcus aureus NEGATIVE NEGATIVE Final    Comment: (NOTE) The Xpert SA Assay (FDA approved for NASAL specimens in patients 18 years of age and older), is one component of a comprehensive surveillance program. It is not intended to diagnose infection nor to guide or monitor treatment. Performed at [facility], [address] [place], [place] [address]      Time coordinating discharge: Over 30 minutes  SIGNED:   Azucena Fallen, DO [facility] 03/11/2019, 1:12 PM Pager   If 7PM-7AM, please contact night-coverage [email or web address] Password [email or web address]

## 2019-03-11 NOTE — TOC Transition Note (Signed)
Transition of Care [facility]) - CM/SW Discharge Note   Patient Details  Name: [name] [name] MRN: [id number] Date of Birth: [birth year]  [facility] CM/SW Contact:  Alberteen Sam, LCSW Phone Number: 03/11/2019, 2:31 PM   Clinical Narrative:     Patient will DC to: [place] Anticipated DC date: 03/11/2019 Family notified: Pamala Hurry (sister) Transport by: Corey Harold  Per MD patient ready for DC to [facility] [place], patient, patient's family, and facility notified of DC. Discharge Summary sent to facility. RN given number for report [phone]. DC packet on chart. Ambulance transport requested for patient.  CSW signing off.  [place], [place]   Final next level of care: Skilled Nursing Facility Barriers to Discharge: No Barriers Identified   Patient Goals and CMS Choice   CMS Medicare.gov Compare Post Acute Care list provided to:: Patient Represenative (must comment)(Barbara (sister)) Choice offered to / list presented to : Sibling(Barbara (sister))  Discharge Placement PASRR number recieved: 03/11/19            Patient chooses bed at: Other - please specify in the comment section below:([facility]) Patient to be transferred to facility by: [place] Name of family member notified: Pamala Hurry (sister) Patient and family notified of of transfer: 03/11/19  Discharge Plan and Services     Post Acute Care Choice: [place]                               Social Determinants of Health (SDOH) Interventions     Readmission Risk Interventions No flowsheet data found.

## 2019-03-11 NOTE — Progress Notes (Addendum)
Occupational Therapy Evaluation Patient Details Name: [name] SnowballDora C [name] MRN: [id number] DOB: 04/18/1941 Today's Date: 03/11/2019    History of Present Illness Pt is a 78 y.o. female admitted from SNF on 03/10/19 after fall sustaining L distal femur fx; now s/p L distal femur ORIF. PMH includes dementia, CHF, HTN, COPD, schizophrenia.   Clinical Impression   Pt received with description above. PTA pt PLOF unable to obtain due to level of cognition. Per chart, pt lived in a SNF where fall occurred with a PMH of dementia. Pt required Max A+2 to total A to sit at EOB with VCs to reach for hand rails. Pt confused throughout session unlikely to recall or retain WB precautions. Pt reported feeling dizzy while seated at EOB (BP mentioned below). Pt will benefit from continued therapy to address level of cognition and to maximize independence in ADLs prior to D/C to SNF. OT will continue to follow acutely.   SpO2 on RA 88-91% BP 103/77 at EOB 103/75 supine      Follow Up Recommendations  SNF;Supervision/Assistance - 24 hour    Equipment Recommendations       Recommendations for Other Services       Precautions / Restrictions Precautions Precautions: Fall Required Braces or Orthoses: Knee Immobilizer - Left Restrictions Weight Bearing Restrictions: Yes LLE Weight Bearing: Non weight bearing Other Position/Activity Restrictions: Per ortho note: "PWB (50% if feasible) for transfers only; if pt unable to follow then NWB"      Mobility Bed Mobility Overal bed mobility: Needs Assistance Bed Mobility: Supine to Sit;Sit to Supine     Supine to sit: Max assist;+2 for physical assistance Sit to supine: Max assist;+2 for physical assistance   General bed mobility comments: Pt able to assist minimally when cued to reach with rail support; maxA+2 for trunk and BLE support with supine<>sit and use of bed pads  Transfers                 General transfer comment: Pt unable to translate  weight anteriorly to initiate standing despite maxA+2    Balance Overall balance assessment: Needs assistance   Sitting balance-Leahy Scale: Poor Sitting balance - Comments: Requiring min-maxA to maintain sitting balance Postural control: Right lateral lean;Posterior lean                                 ADL either performed or assessed with clinical judgement   ADL Overall ADL's : Needs assistance/impaired Eating/Feeding: Minimal assistance;Bed level Eating/Feeding Details (indicate cue type and reason): Min A for attention. Limited function of L hand.  Grooming: Set up;Bed level   Upper Body Bathing: Maximal assistance   Lower Body Bathing: Maximal assistance   Upper Body Dressing : Moderate assistance   Lower Body Dressing: Maximal assistance                 General ADL Comments: OT/PT attempted to transfer to assess tolerance. Pt required support to sit up at EOB. Tends to lean towards R side.      Vision         Perception     Praxis      Pertinent Vitals/Pain Pain Assessment: Faces Faces Pain Scale: Hurts even more Pain Location: LLE with mobility/ROM Pain Descriptors / Indicators: Guarding;Grimacing;Moaning Pain Intervention(s): Monitored during session;Repositioned     Hand Dominance     Extremity/Trunk Assessment Upper Extremity Assessment Upper Extremity Assessment: Generalized weakness  Lower Extremity Assessment Lower Extremity Assessment: Defer to PT evaluation RLE Deficits / Details: Knee flex/ext functionally 3/5 LLE Deficits / Details: s/p L femur ORIF; pt not actively moving LLE, grimacing in pain with hip/knee PROM LLE: Unable to fully assess due to immobilization;Unable to fully assess due to pain       Communication Communication Communication: No difficulties   Cognition Arousal/Alertness: Awake/alert Behavior During Therapy: WFL for tasks assessed/performed Overall Cognitive Status: History of cognitive  impairments - at baseline Area of Impairment: Orientation;Attention;Memory;Following commands;Safety/judgement;Awareness;Problem solving                 Orientation Level: Disoriented to;Place;Time;Situation Current Attention Level: Sustained Memory: Decreased short-term memory;Decreased recall of precautions Following Commands: Follows one step commands inconsistently;Follows one step commands with increased time Safety/Judgement: Decreased awareness of safety;Decreased awareness of deficits Awareness: Intellectual Problem Solving: Slow processing;Decreased initiation;Difficulty sequencing;Requires verbal cues;Requires tactile cues General Comments: Per chart, h/o dementia. Pt pleasantly confused throughout session   General Comments  SpO2 88-91% on RA. Pt c/o dizziness upon sitting; return to supine BP 103/77, 3-min supine BP 103/75    Exercises     Shoulder Instructions      Home Living Family/patient expects to be discharged to:: Skilled nursing facility Living Arrangements: Other (Comment)(Skilled Nursing Facility)                               Additional Comments: Pt with h/o dementia and unreliable historian. Per chart, resides at [facility] .      Prior Functioning/Environment Level of Independence: Needs assistance  Gait / Transfers Assistance Needed: Pt poor historian. Reports she does not use RW              OT Problem List: Decreased activity tolerance;Impaired balance (sitting and/or standing);Decreased safety awareness;Decreased knowledge of use of DME or AE;Decreased cognition;Pain      OT Treatment/Interventions: Self-care/ADL training;Therapeutic exercise;DME and/or AE instruction;Patient/family education;Balance training;Cognitive remediation/compensation    OT Goals(Current goals can be found in the care plan section) Acute Rehab OT Goals Patient Stated Goal: None stated OT Goal Formulation: Patient unable to participate  in goal setting Time For Goal Achievement: 03/25/19 Potential to Achieve Goals: Poor  OT Frequency: Min 2X/week   Barriers to D/C:            Co-evaluation PT/OT/SLP Co-Evaluation/Treatment: Yes Reason for Co-Treatment: Necessary to address cognition/behavior during functional activity;For patient/therapist safety;To address functional/ADL transfers PT goals addressed during session: Mobility/safety with mobility OT goals addressed during session: Proper use of Adaptive equipment and DME;ADL's and self-care      AM-PAC OT "6 Clicks" Daily Activity     Outcome Measure Help from another person eating meals?: A Little Help from another person taking care of personal grooming?: A Lot Help from another person toileting, which includes using toliet, bedpan, or urinal?: A Lot Help from another person bathing (including washing, rinsing, drying)?: A Lot Help from another person to put on and taking off regular upper body clothing?: A Lot Help from another person to put on and taking off regular lower body clothing?: A Lot 6 Click Score: 13   End of Session Equipment Utilized During Treatment: Left knee immobilizer Nurse Communication: Mobility status;Weight bearing status  Activity Tolerance: Patient tolerated treatment well;Patient limited by pain(and weakness) Patient left: in bed;with call bell/phone within reach;with bed alarm set  OT Visit Diagnosis: History of falling (Z91.81);Muscle weakness (generalized) (M62.81);Pain Pain -  Right/Left: Left Pain - part of body: Hip                Time: 0237-0302 OT Time Calculation (min): 25 min Charges:  OT General Charges $OT Visit: 1 Visit OT Evaluation $OT Eval Moderate Complexity: [place], MSOT, OTR/L  Supplemental Rehabilitation Services  [phone]   Marius Ditch 03/11/2019, 3:37 PM

## 2019-03-11 NOTE — Plan of Care (Signed)
  Problem: Health Behavior/Discharge Planning: Goal: Ability to manage health-related needs will improve Outcome: Progressing   

## 2019-03-11 NOTE — Progress Notes (Signed)
ANTICOAGULATION CONSULT NOTE - Follow Up Consult  Pharmacy Consult for Warfarin>>xarelto Indication: chronic DVT / left femur fracture  No Known Allergies  Vital Signs: Temp: 97.7 F (36.5 C) (06/17 0805) Temp Source: Oral (06/17 0805) BP: 147/88 (06/17 0805) Pulse Rate: 109 (06/17 0805)  Labs: Recent Labs    03/10/19 0500 03/10/19 1032 03/11/19 0623  HGB 9.4*  --  8.5*  HCT 29.2*  --  26.6*  PLT 213  --  208  LABPROT  --  16.5* 15.8*  INR  --  1.4* 1.3*  CREATININE 0.81  --  0.79    Estimated Creatinine Clearance: 73.9 mL/min (by C-G formula based on SCr of 0.79 mg/dL).  Assessment: 78 year old female to begin warfarin s/p ORIF 6/16 Prior to admission she was taking Xarelto for chronic DVT. D/W primary MD and Ortho, will restart home xarelto and d/c enoxaparin and warfarin  Goal of Therapy:  Monitor platelets by anticoagulation protocol: Yes   Plan:  D/C warfarin and enoxaparin Start xarelto 20mg  PO daily with supper   [name] [name] A. Levada Dy, PharmD, [place] Please utilize Amion for appropriate phone number to reach the unit pharmacist ([place])   03/11/2019,9:23 AM

## 2019-03-11 NOTE — Progress Notes (Signed)
Orthopedic Trauma Service Progress Note  Patient ID: Lamont SnowballDora C Baxley MRN: [id number] DOB/AGE: 76/12/1940 76 y.o.  Subjective:  Doing ok Reports pain in L knee   ROS As above  Objective:   VITALS:   Vitals:   03/10/19 2200 03/10/19 2316 03/11/19 0350 03/11/19 0805  BP:  (!) 141/85 (!) 148/88 (!) 147/88  Pulse: (!) 101 94 88 (!) 109  Resp: 16 18 18 14   Temp:  98.4 F (36.9 C) 98.6 F (37 C) 97.7 F (36.5 C)  TempSrc:  Oral Oral Oral  SpO2: 94% 99% 100% 100%  Weight:      Height:        Estimated body mass index is 39.22 kg/m as calculated from the following:   Height as of this encounter: 5\' 5"  (1.651 m).   Weight as of this encounter: 106.9 kg.   Intake/Output      06/16 0701 - 06/17 0700 06/17 0701 - 06/18 0700   P.O. 50    I.V. (mL/kg) 1200 (11.2)    IV Piggyback 200    Total Intake(mL/kg) 1450 (13.6)    Urine (mL/kg/hr) 650 (0.3)    Blood 50    Total Output 700    Net +750           LABS  Results for orders placed or performed during the hospital encounter of 03/10/19 (from the past 24 hour(s))  Protime-INR     Status: Abnormal   Collection Time: 03/10/19 10:32 AM  Result Value Ref Range   Prothrombin Time 16.5 (H) 11.4 - 15.2 seconds   INR 1.4 (H) 0.8 - 1.2  CBC     Status: Abnormal   Collection Time: 03/11/19  6:23 AM  Result Value Ref Range   WBC 13.0 (H) 4.0 - 10.5 K/uL   RBC 3.24 (L) 3.87 - 5.11 MIL/uL   Hemoglobin 8.5 (L) 12.0 - 15.0 g/dL   HCT 78.426.6 (L) 69.636.0 - 29.546.0 %   MCV 82.1 80.0 - 100.0 fL   MCH 26.2 26.0 - 34.0 pg   MCHC 32.0 30.0 - 36.0 g/dL   RDW 28.418.8 (H) 13.211.5 - 44.015.5 %   Platelets 208 150 - 400 K/uL   nRBC 0.0 0.0 - 0.2 %  Basic metabolic panel     Status: Abnormal   Collection Time: 03/11/19  6:23 AM  Result Value Ref Range   Sodium 138 135 - 145 mmol/L   Potassium 3.7 3.5 - 5.1 mmol/L   Chloride 100 98 - 111 mmol/L   CO2 26 22 - 32 mmol/L   Glucose,  Bld 128 (H) 70 - 99 mg/dL   BUN 14 8 - 23 mg/dL   Creatinine, Ser 1.020.79 0.44 - 1.00 mg/dL   Calcium 9.0 8.9 - 72.510.3 mg/dL   GFR calc non Af Amer >60 >60 mL/min   GFR calc Af Amer >60 >60 mL/min   Anion gap 12 5 - 15  Protime-INR     Status: Abnormal   Collection Time: 03/11/19  6:23 AM  Result Value Ref Range   Prothrombin Time 15.8 (H) 11.4 - 15.2 seconds   INR 1.3 (H) 0.8 - 1.2     PHYSICAL EXAM:   Gen: NAD, sitting up in bed on CPAP Ext:       Left Lower Extremity  Knee immobilizer removed  Dressing c/d/i  Ext warm   Swelling controlled  Distal motor and sensory functions grossly intact   No DCT   Assessment/Plan: 1 Day Post-Op   Principal Problem:   Femur fracture, left (HCC) Active Problems:   Leukocytosis   Anemia   Vitamin D deficiency   HTN (hypertension)   Anti-infectives (From admission, onward)   Start     Dose/Rate Route Frequency Ordered Stop   03/10/19 1830  ceFAZolin (ANCEF) IVPB 2g/100 mL premix     2 g 200 mL/hr over 30 Minutes Intravenous Every 6 hours 03/10/19 1823 03/11/19 0007   03/10/19 1000  ceFAZolin (ANCEF) IVPB 2g/100 mL premix     2 g 200 mL/hr over 30 Minutes Intravenous On call to O.R. 03/10/19 0957 03/10/19 1502    .  POD/HD#: 1  78 y/o female s/p ORIF L distal femur fracutre  -fall  - L distal femur fracture s/p ORIF   PWB (50% if feasible) for transfers only    If pt unable to follow then NWB   ROM as tolerated L lower extremity   Ice and elevate  Dressing changes as needed   PT/OT   - Pain management:  Minimize narcotics    - ABL anemia/Hemodynamics  Monitor   Mild HTN and tachycardia    H/h stable right now  - Medical issues   Per primary   - DVT/PE prophylaxis:  Restart xarelto for VTE history  - ID:   periop abx   - Metabolic Bone Disease:  Labs pending   Hx of vitamin d deficiency   - Activity:  As above  Up with assistance  - FEN/GI prophylaxis/Foley/Lines:  Bedside swallow eval given  dementia hx   - Impediments to fracture healing:  Osteoporosis  Dementia   Chronic PPI   COPD   - Dispo:  PT/OT   SW for SNF     Jari Pigg, PA-C [phone] (C) 03/11/2019, 9:36 AM  [facility] [place] [address] [phone] Domingo Sep (F)

## 2019-03-11 NOTE — Evaluation (Signed)
Clinical/Bedside Swallow Evaluation Patient Details  Name: [name] [name] MRN: [id number] Date of Birth: November 20, 1940  Today's Date: 03/11/2019 Time: SLP Start Time (ACUTE ONLY): 1004 SLP Stop Time (ACUTE ONLY): 1019 SLP Time Calculation (min) (ACUTE ONLY): 15 min  Past Medical History:  Past Medical History:  Diagnosis Date  . Anxiety   . Arthritis    "joints" (03/30/2013)  . Asthma   . Chest pain, exertional   . Chronic bronchitis (HCC)   . Chronic pain    "over my whole body" (03/30/2013)  . Complication of anesthesia    "because I have sleep apnea" (03/30/2013)  . Daily headache    "last 2 months" (03/30/2013)  . Depression   . DVT (deep venous thrombosis) (HCC)    per 01/17/10 d/c summary- "remote hx of dvt"?  . Exertional shortness of breath    "& sometimes when laying down" (03/30/2013)  . GERD (gastroesophageal reflux disease)   . Glaucoma   . Heart murmur   . Hemorrhoids   . Hyperlipidemia   . Hypertension   . Iron deficiency anemia   . Pneumonia    "a few times" (03/30/2013)  . Psoriasis 04/29/2012   New onset evaluated by Dr Marylou Flesher, Dermatology, [facility] 6/13  Rx 0.1% Tacrolimus ointment  . Psoriasis   . Pulmonary embolism (HCC) 12/2009   Large central bilateral PE's   . Schizophrenia (HCC)   . Sleep apnea    "waiting on my CPAP" (03/30/2013)  . Varicose veins of legs    Past Surgical History:  Past Surgical History:  Procedure Laterality Date  . CARPAL TUNNEL RELEASE Bilateral   . DILATION AND CURETTAGE OF UTERUS  1970's   "once" (03/30/2013)  . JOINT REPLACEMENT    . SPINAL FUSION     2004  . TOTAL KNEE ARTHROPLASTY Right 11/2008  . TUBAL LIGATION  1973  . VEIN SURGERY  left leg   HPI:  [name] [name] is a 78 y.o. female with medical history significant of dementia, CHF, GERD, schizophrenia, hypertension, hyperlipidemia, COPD, OSA presented to [facility] ED from her nursing facility after a witnessed fall.  Found to have left distal femur fracture.  Underwent ORIF 6/16. No recent CXR.   Assessment / Plan / Recommendation Clinical Impression  Pt pleasantly confused and cooperative. She is endentulous and unreliable response re: use of dentures during po's. Consumed straw sips water consistently without s/s aspiration. Functional mastication of solid but will downgrade to Dys 3 for precaution while in hospital. Straws allowed and pills with thin. Will see while in acute with recommended consistency.  SLP Visit Diagnosis: Dysphagia, unspecified (R13.10)    Aspiration Risk  Mild aspiration risk    Diet Recommendation Dysphagia 3 (Mech soft);Thin liquid   Liquid Administration via: Cup;Straw Medication Administration: Whole meds with liquid Supervision: Patient able to self feed Compensations: Slow rate;Minimize environmental distractions;Small sips/bites Postural Changes: Seated upright at 90 degrees    Other  Recommendations Oral Care Recommendations: Oral care BID   Follow up Recommendations Skilled Nursing facility      Frequency and Duration min 1 x/week  1 week       Prognosis Prognosis for Safe Diet Advancement: (fair-good)      Swallow Study   General HPI: [name] [name] is a 78 y.o. female with medical history significant of dementia, CHF, GERD, schizophrenia, hypertension, hyperlipidemia, COPD, OSA presented to [facility] ED from her nursing facility after a witnessed fall.  Found to have left  distal femur fracture. Underwent ORIF 6/16. No recent CXR. Type of Study: Bedside Swallow Evaluation Previous Swallow Assessment: (none found) Diet Prior to this Study: Regular;Thin liquids Temperature Spikes Noted: No Respiratory Status: Room air History of Recent Intubation: Yes Length of Intubations (days): (during surgery) Date extubated: 03/10/19 Behavior/Cognition: Alert;Confused;Requires cueing Oral Cavity Assessment: Other (comment)(lingual candidias) Oral Care Completed by SLP: No Oral Cavity - Dentition:  Edentulous Vision: Functional for self-feeding Self-Feeding Abilities: Needs assist Patient Positioning: Upright in bed Baseline Vocal Quality: Normal Volitional Cough: Cognitively unable to elicit    Oral/Motor/Sensory Function Overall Oral Motor/Sensory Function: Other (comment)(decr coordination)   Ice Chips Ice chips: Not tested   Thin Liquid Thin Liquid: Within functional limits    Nectar Thick Nectar Thick Liquid: Not tested   Honey Thick Honey Thick Liquid: Not tested   Puree Puree: Within functional limits   Solid     Solid: Within functional limits      Royce Macadamia 03/11/2019,10:41 AM  Breck Coons Lonell Face.Ed Nurse, children's [phone] Office [phone]

## 2019-03-11 NOTE — Progress Notes (Signed)
Report has been called to [employer] in [place]. Pt resting with eyes closed at this time. Continue to monitor.

## 2019-03-11 NOTE — Evaluation (Signed)
Physical Therapy Evaluation Patient Details Name: [name] [name] MRN: [id number] DOB: 03/23/1941 Today's Date: 03/11/2019   History of Present Illness  Pt is a 78 y.o. female admitted from SNF on 03/10/19 after fall sustaining L distal femur fx; now s/p L distal femur ORIF. PMH includes dementia, CHF, HTN, COPD, schizophrenia.    Clinical Impression  Pt presents with an overall decrease in functional mobility secondary to above. Pt with h/o dementia and unreliable historian; per chart[facility] Today, pt required maxA+2 to totalA to sit EOB, unable to initiate anterior weight translation in order to attempt standing this session despite assist+2. Pt pleasantly confused throughout session; unlikely to be able to maintain LLE WB precautions; grimacing in pain with all LLE PROM. Pt c/o dizziness upon sitting (see BP values below). Pt would benefit from continued acute PT services to maximize functional mobility and independence prior to d/c with SNF-level therapies.   SpO2 88-91% on RA  Return to supine BP 103/77 3-min supine BP 103/75    Follow Up Recommendations SNF;Supervision/Assistance - 24 hour    Equipment Recommendations  (TBD next venue)    Recommendations for Other Services       Precautions / Restrictions Precautions Precautions: Fall Required Braces or Orthoses: Knee Immobilizer - Left Restrictions Weight Bearing Restrictions: Yes LLE Weight Bearing: Non weight bearing Other Position/Activity Restrictions: Per ortho note: "PWB (50% if feasible) for transfers only; if pt unable to follow then NWB"      Mobility  Bed Mobility Overal bed mobility: Needs Assistance Bed Mobility: Supine to Sit;Sit to Supine     Supine to sit: Max assist;+2 for physical assistance Sit to supine: Max assist;+2 for physical assistance   General bed mobility comments: Pt able to assist minimally when cued to reach with rail support; maxA+2 for trunk and BLE support with supine<>sit  and use of bed pads  Transfers                 General transfer comment: Pt unable to translate weight anteriorly to initiate standing despite maxA+2  Ambulation/Gait                Stairs            Wheelchair Mobility    Modified Rankin (Stroke Patients Only)       Balance Overall balance assessment: Needs assistance   Sitting balance-Leahy Scale: Poor Sitting balance - Comments: Requiring min-maxA to maintain sitting balance Postural control: Right lateral lean;Posterior lean                                   Pertinent Vitals/Pain Pain Assessment: Faces Faces Pain Scale: Hurts even more Pain Location: LLE with mobility/ROM Pain Descriptors / Indicators: Guarding;Grimacing;Moaning Pain Intervention(s): Monitored during session;Repositioned    Home Living Family/patient expects to be discharged to:: Skilled nursing facility                 Additional Comments: Pt with h/o dementia and unreliable historian. Per chart[facility] .    Prior Function Level of Independence: Needs assistance   Gait / Transfers Assistance Needed: Pt poor historian. Reports she does not use RW           Hand Dominance        Extremity/Trunk Assessment   Upper Extremity Assessment Upper Extremity Assessment: Generalized weakness    Lower Extremity Assessment  Lower Extremity Assessment: RLE deficits/detail;LLE deficits/detail;Generalized weakness RLE Deficits / Details: Knee flex/ext functionally 3/5 LLE Deficits / Details: s/p L femur ORIF; pt not actively moving LLE, grimacing in pain with hip/knee PROM LLE: Unable to fully assess due to immobilization;Unable to fully assess due to pain       Communication   Communication: No difficulties  Cognition Arousal/Alertness: Awake/alert Behavior During Therapy: WFL for tasks assessed/performed Overall Cognitive Status: History of cognitive impairments - at  baseline Area of Impairment: Orientation;Attention;Memory;Following commands;Safety/judgement;Awareness;Problem solving                 Orientation Level: Disoriented to;Place;Time;Situation Current Attention Level: Sustained Memory: Decreased short-term memory;Decreased recall of precautions Following Commands: Follows one step commands inconsistently;Follows one step commands with increased time Safety/Judgement: Decreased awareness of safety;Decreased awareness of deficits Awareness: Intellectual Problem Solving: Slow processing;Decreased initiation;Difficulty sequencing;Requires verbal cues;Requires tactile cues General Comments: Per chart, h/o dementia. Pt pleasantly confused throughout session      General Comments General comments (skin integrity, edema, etc.): SpO2 88-91% on RA. Pt c/o dizziness upon sitting; return to supine BP 103/77, 3-min supine BP 103/75    Exercises     Assessment/Plan    PT Assessment Patient needs continued PT services  PT Problem List Decreased strength;Decreased range of motion;Decreased activity tolerance;Decreased balance;Decreased mobility;Decreased cognition;Decreased knowledge of use of DME;Decreased safety awareness;Decreased knowledge of precautions;Pain;Obesity       PT Treatment Interventions DME instruction;Gait training;Functional mobility training;Therapeutic activities;Therapeutic exercise;Balance training;Patient/family education;Wheelchair mobility training    PT Goals (Current goals can be found in the Care Plan section)  Acute Rehab PT Goals Patient Stated Goal: None stated PT Goal Formulation: With patient Time For Goal Achievement: 03/26/19 Potential to Achieve Goals: Fair    Frequency Min 3X/week   Barriers to discharge        Co-evaluation PT/OT/SLP Co-Evaluation/Treatment: Yes Reason for Co-Treatment: Necessary to address cognition/behavior during functional activity;For patient/therapist safety;To address  functional/ADL transfers PT goals addressed during session: Mobility/safety with mobility         AM-PAC PT "6 Clicks" Mobility  Outcome Measure Help needed turning from your back to your side while in a flat bed without using bedrails?: A Lot Help needed moving from lying on your back to sitting on the side of a flat bed without using bedrails?: A Lot Help needed moving to and from a bed to a chair (including a wheelchair)?: Total Help needed standing up from a chair using your arms (e.g., wheelchair or bedside chair)?: Total Help needed to walk in hospital room?: Total Help needed climbing 3-5 steps with a railing? : Total 6 Click Score: 8    End of Session   Activity Tolerance: Patient tolerated treatment well;Patient limited by pain Patient left: in bed;with call bell/phone within reach;with bed alarm set Nurse Communication: Mobility status PT Visit Diagnosis: Other abnormalities of gait and mobility (R26.89);Pain Pain - Right/Left: Left Pain - part of body: Leg    Time: 0947-0962 PT Time Calculation (min) (ACUTE ONLY): 25 min   Charges:   PT Evaluation $PT Eval Moderate Complexity: 1 Mod     Mabeline Caras, PT, DPT Acute Rehabilitation Services  Pager [phone] Office [place] 03/11/2019, 3:19 PM

## 2023-04-26 ENCOUNTER — Observation Stay (HOSPITAL_COMMUNITY)
Admission: EM | Admit: 2023-04-26 | Discharge: 2023-04-28 | Disposition: A | Discharge status: Skilled Nursing Facility | Payer: Medicare Other | Attending: Internal Medicine | Admitting: Internal Medicine

## 2023-04-26 ENCOUNTER — Emergency Department (HOSPITAL_COMMUNITY): Payer: Medicare Other

## 2023-04-26 ENCOUNTER — Encounter (HOSPITAL_COMMUNITY): Payer: Self-pay | Admitting: *Deleted

## 2023-04-26 ENCOUNTER — Other Ambulatory Visit: Payer: Self-pay

## 2023-04-26 ENCOUNTER — Observation Stay (HOSPITAL_COMMUNITY): Payer: Medicare Other

## 2023-04-26 DIAGNOSIS — G9341 Metabolic encephalopathy: Principal | ICD-10-CM | POA: Insufficient documentation

## 2023-04-26 DIAGNOSIS — Z96651 Presence of right artificial knee joint: Secondary | ICD-10-CM | POA: Insufficient documentation

## 2023-04-26 DIAGNOSIS — G9340 Encephalopathy, unspecified: Secondary | ICD-10-CM | POA: Diagnosis present

## 2023-04-26 DIAGNOSIS — R479 Unspecified speech disturbances: Secondary | ICD-10-CM | POA: Diagnosis not present

## 2023-04-26 DIAGNOSIS — R82998 Other abnormal findings in urine: Secondary | ICD-10-CM | POA: Diagnosis not present

## 2023-04-26 DIAGNOSIS — I10 Essential (primary) hypertension: Secondary | ICD-10-CM | POA: Insufficient documentation

## 2023-04-26 DIAGNOSIS — R4781 Slurred speech: Secondary | ICD-10-CM | POA: Diagnosis present

## 2023-04-26 DIAGNOSIS — Z7901 Long term (current) use of anticoagulants: Secondary | ICD-10-CM | POA: Insufficient documentation

## 2023-04-26 DIAGNOSIS — Z86711 Personal history of pulmonary embolism: Secondary | ICD-10-CM | POA: Insufficient documentation

## 2023-04-26 DIAGNOSIS — U071 COVID-19: Secondary | ICD-10-CM | POA: Diagnosis not present

## 2023-04-26 DIAGNOSIS — F03A0 Unspecified dementia, mild, without behavioral disturbance, psychotic disturbance, mood disturbance, and anxiety: Secondary | ICD-10-CM | POA: Diagnosis not present

## 2023-04-26 DIAGNOSIS — Z79899 Other long term (current) drug therapy: Secondary | ICD-10-CM | POA: Insufficient documentation

## 2023-04-26 LAB — URINALYSIS, W/ REFLEX TO CULTURE (INFECTION SUSPECTED)
Bacteria, UA: NONE SEEN
Bilirubin Urine: NEGATIVE
Glucose, UA: NEGATIVE mg/dL
Ketones, ur: NEGATIVE mg/dL
Leukocytes,Ua: NEGATIVE
Nitrite: NEGATIVE
Protein, ur: NEGATIVE mg/dL
Specific Gravity, Urine: 1.039 — ABNORMAL HIGH (ref 1.005–1.030)
pH: 7 (ref 5.0–8.0)

## 2023-04-26 LAB — CBC
HCT: 40.6 % (ref 36.0–46.0)
Hemoglobin: 13.6 g/dL (ref 12.0–15.0)
MCH: 32.7 pg (ref 26.0–34.0)
MCHC: 33.5 g/dL (ref 30.0–36.0)
MCV: 97.6 fL (ref 80.0–100.0)
Platelets: 135 10*3/uL — ABNORMAL LOW (ref 150–400)
RBC: 4.16 MIL/uL (ref 3.87–5.11)
RDW: 13.7 % (ref 11.5–15.5)
WBC: 8.4 10*3/uL (ref 4.0–10.5)
nRBC: 0 % (ref 0.0–0.2)

## 2023-04-26 LAB — COMPREHENSIVE METABOLIC PANEL
ALT: 8 U/L (ref 0–44)
AST: 13 U/L — ABNORMAL LOW (ref 15–41)
Albumin: 3.3 g/dL — ABNORMAL LOW (ref 3.5–5.0)
Alkaline Phosphatase: 60 U/L (ref 38–126)
Anion gap: 10 (ref 5–15)
BUN: 9 mg/dL (ref 8–23)
CO2: 27 mmol/L (ref 22–32)
Calcium: 9.1 mg/dL (ref 8.9–10.3)
Chloride: 97 mmol/L — ABNORMAL LOW (ref 98–111)
Creatinine, Ser: 0.82 mg/dL (ref 0.44–1.00)
GFR, Estimated: >60 mL/min (ref >60)
Glucose, Bld: 100 mg/dL — ABNORMAL HIGH (ref 70–99)
Potassium: 4.3 mmol/L (ref 3.5–5.1)
Sodium: 134 mmol/L — ABNORMAL LOW (ref 135–145)
Total Bilirubin: 0.4 mg/dL (ref 0.3–1.2)
Total Protein: 8.3 g/dL — ABNORMAL HIGH (ref 6.5–8.1)

## 2023-04-26 LAB — DIFFERENTIAL
Abs Immature Granulocytes: 0.09 10*3/uL — ABNORMAL HIGH (ref 0.00–0.07)
Basophils Absolute: 0 10*3/uL (ref 0.0–0.1)
Basophils Relative: 0 %
Eosinophils Absolute: 0 10*3/uL (ref 0.0–0.5)
Eosinophils Relative: 0 %
Immature Granulocytes: 1 %
Lymphocytes Relative: 6 %
Lymphs Abs: 0.5 10*3/uL — ABNORMAL LOW (ref 0.7–4.0)
Monocytes Absolute: 0.4 10*3/uL (ref 0.1–1.0)
Monocytes Relative: 4 %
Neutro Abs: 7.4 10*3/uL (ref 1.7–7.7)
Neutrophils Relative %: 89 %

## 2023-04-26 LAB — I-STAT CHEM 8, ED
BUN: 10 mg/dL (ref 8–23)
Calcium, Ion: 1.11 mmol/L — ABNORMAL LOW (ref 1.15–1.40)
Chloride: 100 mmol/L (ref 98–111)
Creatinine, Ser: 0.7 mg/dL (ref 0.44–1.00)
Glucose, Bld: 119 mg/dL — ABNORMAL HIGH (ref 70–99)
HCT: 41 % (ref 36.0–46.0)
Hemoglobin: 13.9 g/dL (ref 12.0–15.0)
Potassium: 4 mmol/L (ref 3.5–5.1)
Sodium: 137 mmol/L (ref 135–145)
TCO2: 27 mmol/L (ref 22–32)

## 2023-04-26 LAB — CBG MONITORING, ED: Glucose-Capillary: 124 mg/dL — ABNORMAL HIGH (ref 70–99)

## 2023-04-26 LAB — PROTIME-INR
INR: 1.2 (ref 0.8–1.2)
Prothrombin Time: 15.6 seconds — ABNORMAL HIGH (ref 11.4–15.2)

## 2023-04-26 LAB — RAPID URINE DRUG SCREEN, HOSP PERFORMED
Amphetamines: NOT DETECTED
Barbiturates: NOT DETECTED
Benzodiazepines: NOT DETECTED
Cocaine: NOT DETECTED
Opiates: NOT DETECTED
Tetrahydrocannabinol: NOT DETECTED

## 2023-04-26 LAB — ETHANOL: Alcohol, Ethyl (B): <10 mg/dL (ref <10)

## 2023-04-26 LAB — SARS CORONAVIRUS 2 BY RT PCR: SARS Coronavirus 2 by RT PCR: POSITIVE — AB

## 2023-04-26 LAB — I-STAT CG4 LACTIC ACID, ED: Lactic Acid, Venous: 1.8 mmol/L (ref 0.5–1.9)

## 2023-04-26 LAB — APTT: aPTT: 35 seconds (ref 24–36)

## 2023-04-26 MED ORDER — POLYETHYLENE GLYCOL 3350 17 G PO PACK
17.0000 g | PACK | Freq: Every day | ORAL | Status: DC
  Administered 2023-04-28: 17 g via ORAL
  Filled 2023-04-26: qty 1

## 2023-04-26 MED ORDER — DIVALPROEX SODIUM 125 MG PO CSDR
375.0000 mg | DELAYED_RELEASE_CAPSULE | Freq: Every morning | ORAL | Status: DC
  Administered 2023-04-27 – 2023-04-28 (×2): 375 mg via ORAL
  Filled 2023-04-26 (×2): qty 3

## 2023-04-26 MED ORDER — METOPROLOL TARTRATE 12.5 MG HALF TABLET
12.5000 mg | ORAL_TABLET | Freq: Two times a day (BID) | ORAL | Status: DC
  Administered 2023-04-27 – 2023-04-28 (×3): 12.5 mg via ORAL
  Filled 2023-04-26 (×3): qty 1

## 2023-04-26 MED ORDER — RIVAROXABAN 20 MG PO TABS
20.0000 mg | ORAL_TABLET | Freq: Every day | ORAL | Status: DC
  Administered 2023-04-27: 20 mg via ORAL
  Filled 2023-04-26: qty 1

## 2023-04-26 MED ORDER — ENOXAPARIN SODIUM 40 MG/0.4ML IJ SOSY: 40.0000 mg | PREFILLED_SYRINGE | INTRAMUSCULAR | Status: DC

## 2023-04-26 MED ORDER — GABAPENTIN 400 MG PO CAPS
400.0000 mg | ORAL_CAPSULE | Freq: Three times a day (TID) | ORAL | Status: DC
  Administered 2023-04-27 – 2023-04-28 (×4): 400 mg via ORAL
  Filled 2023-04-26 (×4): qty 1

## 2023-04-26 MED ORDER — ACETAMINOPHEN 325 MG PO TABS
650.0000 mg | ORAL_TABLET | Freq: Four times a day (QID) | ORAL | Status: DC | PRN
  Administered 2023-04-27 – 2023-04-28 (×2): 650 mg via ORAL
  Filled 2023-04-26 (×2): qty 2

## 2023-04-26 MED ORDER — ACETAMINOPHEN 325 MG PO TABS
650.0000 mg | ORAL_TABLET | Freq: Once | ORAL | Status: DC
  Filled 2023-04-26: qty 2

## 2023-04-26 MED ORDER — ATORVASTATIN CALCIUM 10 MG PO TABS
10.0000 mg | ORAL_TABLET | Freq: Every evening | ORAL | Status: DC
  Administered 2023-04-27: 10 mg via ORAL
  Filled 2023-04-26: qty 1

## 2023-04-26 MED ORDER — DOCUSATE SODIUM 100 MG PO CAPS
100.0000 mg | ORAL_CAPSULE | Freq: Every day | ORAL | Status: DC
  Administered 2023-04-28: 100 mg via ORAL
  Filled 2023-04-26 (×2): qty 1

## 2023-04-26 MED ORDER — ACETAMINOPHEN 325 MG PO TABS: 650.0000 mg | ORAL_TABLET | Freq: Four times a day (QID) | ORAL | Status: DC | PRN

## 2023-04-26 MED ORDER — VITAMIN D 25 MCG (1000 UNIT) PO TABS
1000.0000 [IU] | ORAL_TABLET | Freq: Every day | ORAL | Status: DC
  Administered 2023-04-27 – 2023-04-28 (×2): 1000 [IU] via ORAL
  Filled 2023-04-26 (×2): qty 1

## 2023-04-26 MED ORDER — IOHEXOL 350 MG/ML SOLN
75.0000 mL | Freq: Once | INTRAVENOUS | Status: AC | PRN
  Administered 2023-04-26: 75 mL via INTRAVENOUS

## 2023-04-26 MED ORDER — ACETAMINOPHEN 650 MG RE SUPP
650.0000 mg | Freq: Once | RECTAL | Status: AC
  Administered 2023-04-26: 650 mg via RECTAL
  Filled 2023-04-26: qty 1

## 2023-04-26 MED ORDER — FLUOXETINE HCL 20 MG PO CAPS
40.0000 mg | ORAL_CAPSULE | Freq: Every day | ORAL | Status: DC
  Administered 2023-04-27 – 2023-04-28 (×2): 40 mg via ORAL
  Filled 2023-04-26 (×2): qty 2

## 2023-04-26 MED ORDER — IPRATROPIUM-ALBUTEROL 0.5-2.5 (3) MG/3ML IN SOLN
3.0000 mL | Freq: Four times a day (QID) | RESPIRATORY_TRACT | Status: DC | PRN
  Administered 2023-04-27: 3 mL via RESPIRATORY_TRACT
  Filled 2023-04-26: qty 3

## 2023-04-26 MED ORDER — DIVALPROEX SODIUM 125 MG PO CSDR
500.0000 mg | DELAYED_RELEASE_CAPSULE | Freq: Every day | ORAL | Status: DC
  Administered 2023-04-27: 500 mg via ORAL
  Filled 2023-04-26: qty 4

## 2023-04-26 MED ORDER — METOPROLOL TARTRATE 25 MG PO TABS: 12.5000 mg | ORAL_TABLET | Freq: Two times a day (BID) | ORAL | Status: DC

## 2023-04-26 MED ORDER — ACETAMINOPHEN 650 MG RE SUPP: 650.0000 mg | Freq: Four times a day (QID) | RECTAL | Status: DC | PRN

## 2023-04-26 MED ORDER — LIDOCAINE 5 % EX PTCH
1.0000 | MEDICATED_PATCH | Freq: Every day | CUTANEOUS | Status: DC
  Administered 2023-04-27: 1 via TRANSDERMAL
  Filled 2023-04-26 (×2): qty 1

## 2023-04-26 MED ORDER — FERROUS SULFATE 325 (65 FE) MG PO TABS
325.0000 mg | ORAL_TABLET | Freq: Every day | ORAL | Status: DC
  Administered 2023-04-27 – 2023-04-28 (×2): 325 mg via ORAL
  Filled 2023-04-26 (×2): qty 1

## 2023-04-26 NOTE — H&P (Incomplete)
Date: 04/26/2023               Patient Name:  [name] [name] MRN: [id number]  DOB: March 13, 1941 Age / Sex: 82 y.o., female   PCP: Care, Jovita Kussmaul [facility]         Medical Service: Internal Medicine Teaching Service         Attending Physician: Dr. Inez Catalina, MD      First Contact: Dr. Laretta Bolster, MD Pager [phone]    Second Contact: Dr. Olegario Messier, MD Pager [phone]         After Hours (After 5p/  First Contact Pager: [phone]  weekends / holidays): Second Contact Pager: [phone]   SUBJECTIVE   Chief Complaint: AMS / code stroke  History of Present Illness:  [name] [name] is an 82 yo F with PMH mild dementia, schizoaffective and bipolar disorders, HTN presented to [facility] ED as code stroke. Her last known well was 11am. Around 1330 she was noted to have new R facial droop, R arm weakness, and slurred speech. Prior to this, pt had complained to daughter of a sore throat and cough. She arrived to [facility] ED at 1404 via EMS. She underwent CT/MRI of the head that were negative for acute processes. Was found to be COVID positive and per daughter only previous complaint was sore throat.  Further history limited. Since arrival, pt has displayed AMS with unintelligible speech and is unable to follow commands.  Per pts daughter: she is conversational and intelligible at her baseline but is disoriented to place and time at baseline. She is not ambulatory due to a fall in 2020 that fractured both femurs, so she uses a wheelchair. She has b/l arm stiffness at baseline due to severe arthritis.  Pt is resident of [facility] in [place]   ED Course: Arrived to [facility] ED at 1404 via EMS. Stroke-like symptoms noticed approx one half hour before at her facility in [place]. During workup, she was found to be febrile 103F and received tylenol. Non-con head CT and CTA head and neck were negative for acute changes and LVO. CXR negative for acute process, stable enlarge pericardial  sihouette and widened mediastinum. Neurology consulted and recommended MRI and EEG. MRI negative for acute abnormality, did show chronic L frontal infarct and chronic microvascular ischemic changes. EEG: abnormal and consistent with generalized brain dysfunction, nonspecific. Pt was found to be COVID positive. Ethanol and UDS negative. UA does not suggest infection. Blood cultures drawn. IMTS took over care.  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 CARPAL TUNNEL RELEASE Bilateral   . DILATION AND CURETTAGE OF UTERUS  1970's   "once" (03/30/2013)  . JOINT REPLACEMENT    . ORIF FEMUR FRACTURE Left 03/10/2019   Procedure: OPEN REDUCTION INTERNAL FIXATION (ORIF) DISTAL FEMUR FRACTURE;  Surgeon: Myrene Galas, MD;  Location: [facility] OR;  Service: Orthopedics;  Laterality: Left;  . SPINAL FUSION     2004  . TOTAL  KNEE ARTHROPLASTY Right 11/2008  . TUBAL LIGATION  1973  . VEIN SURGERY  left leg   Social:  Lives at [facility] in [place]  Support: daughter lives nearby Level of Function: dependent in ADLs and IADLs PCP: Care, Risk analyst [facility] Substances: Unable to obtain per AMS, chart review negative for tobacco, alcohol, illicit drugs  Family History:  Unable to obtain per AMS Family History  Problem Relation Age of Onset  . Diabetes Sister   . Colon cancer Brother 40  . Colon cancer Brother       Allergies: NKDA  Review of Systems: A complete ROS was negative except as per HPI.   OBJECTIVE:   Physical Exam: Blood pressure 127/79, pulse 90, temperature (!) 103.2 F (39.6 C), temperature source Rectal, resp. rate (!) 22, height 5\' 5"  (1.651 m), weight 108.4 kg, last menstrual period 11/22/1996, SpO2 97%.  Constitutional: chronically ill appearing female in bed. In no acute distress. HENT: Normocephalic, atraumatic,  Eyes: Sclera non-icteric, PERRL, EOM intact Cardio:Regular rate and rhythm.  2+ bilateral radial and dorsalis pedis  pulses. Pulm: anterior lung fields clear to auscultation bilaterally. Normal work of breathing on room air. Abdomen: Soft, non-tender, non-distended, positive bowel sounds. WCB:JSEGBTDV for extremity edema. Muscle tightness in b/l UE and LE, resistant to passive movement. Skin:Warm and dry. Neuro: Limited due to AMS. Nonsensical speech. Cannot follow commands. Ax0. No facial droop. Eyes PERRLA, EOM intact. No focal deficit noted but pt does not follow commands. Withdraws to pain in b/l LE. Unable to perform cerebellar testing. Psych:Confused and anxious  Labs: CBC    Component Value Date/Time   WBC 8.4 04/26/2023 1404   RBC 4.16 04/26/2023 1404   HGB 13.9 04/26/2023 1408   HGB 11.7 12/01/2013 1056   HCT 41.0 04/26/2023 1408   HCT 35.1 12/01/2013 1056   PLT 135 (L) 04/26/2023 1404   PLT 241 12/01/2013 1056   MCV 97.6 04/26/2023 1404   MCV 94.7 12/01/2013 1056   MCH 32.7 04/26/2023 1404   MCHC 33.5 04/26/2023 1404   RDW 13.7 04/26/2023 1404   RDW 14.4 12/01/2013 1056   LYMPHSABS 0.5 (L) 04/26/2023 1404   LYMPHSABS 1.1 12/01/2013 1056   MONOABS 0.4 04/26/2023 1404   MONOABS 0.3 12/01/2013 1056   EOSABS 0.0 04/26/2023 1404   EOSABS 0.2 12/01/2013 1056   BASOSABS 0.0 04/26/2023 1404   BASOSABS 0.0 12/01/2013 1056     CMP     Component Value Date/Time   NA 137 04/26/2023 1408   NA 138 12/01/2013 1056   K 4.0 04/26/2023 1408    K 3.9 12/01/2013 1056   CL 100 04/26/2023 1408   CO2 27 04/26/2023 1404   CO2 28 12/01/2013 1056   GLUCOSE 119 (H) 04/26/2023 1408   GLUCOSE 104 12/01/2013 1056   BUN 10 04/26/2023 1408   BUN 15.4 12/01/2013 1056   CREATININE 0.70 04/26/2023 1408   CREATININE 0.9 12/01/2013 1056   CALCIUM 9.1 04/26/2023 1404   CALCIUM 9.3 12/01/2013 1056   PROT 8.3 (H) 04/26/2023 1404   PROT 8.0 05/12/2013 1028   ALBUMIN 3.3 (L) 04/26/2023 1404   ALBUMIN 3.6 05/12/2013 1028   AST 13 (L) 04/26/2023 1404   AST 21 05/12/2013 1028   ALT 8 04/26/2023 1404   ALT 21 05/12/2013 1028   ALKPHOS 60 04/26/2023 1404   ALKPHOS 98 05/12/2013 1028   BILITOT 0.4 04/26/2023 1404   BILITOT 0.27 05/12/2013 1028   GFRNONAA >60 04/26/2023 1404  GFRAA >60 03/11/2019 4098    Imaging: EEG adult  Result Date: 04/26/2023 Windell Norfolk, MD     04/26/2023  9:29 PM History: 82 year old woman with dementia presenting with altered mental status. EEG to rule out seizure EEG classification: Awake and drowsy Duration: 23 minutes Technical aspects: This EEG study was done with scalp electrodes positioned according to the 10-20 International system of electrode placement. Electrical activity was reviewed with band pass filter of 1-70Hz , sensitivity of 7 uV/mm, display speed of 29mm/sec with a 60Hz  notched filter applied as appropriate. EEG data were recorded continuously and digitally stored. Description of the recording: The background rhythms of this recording consists of a fairly well modulated medium amplitude delta-theta activity. Present in the anterior head region is a 15-20 Hz beta activity. Photic stimulation was not performed. Hyperventilation was not performed. No abnormal epileptiform discharges seen during this recording. There was mild to moderate diffuse slowing with sharp contoured waves. There was increase myogenic artifact. There were no electrographic seizure identified. Abnormality: Mild to moderate diffuse slowing  Impression: This is an abnormal EEG recorded while drowsy and awake due to mild to moderate diffuse slowing which is consistent with generalized brain dysfunction, nonspecific.  Windell Norfolk, MD [facility]   MR Brain Wo Contrast (neuro protocol)  Result Date: 04/26/2023 CLINICAL DATA:  Initial evaluation for neuro deficit, stroke. EXAM: MRI HEAD WITHOUT CONTRAST TECHNIQUE: Multiplanar, multiecho pulse sequences of the brain and surrounding structures were obtained without intravenous contrast. COMPARISON:  CT from earlier the same day. FINDINGS: Brain: Examination degraded by motion. Cerebral volume within normal limits. T2/FLAIR hyperintensity involving the periventricular deep white matter both cerebral hemispheres, consistent with chronic small vessel ischemic disease. Encephalomalacia and gliosis at the anterior left frontal lobe consistent with prior infarct. Minimal chronic hemosiderin staining at this location. No evidence for acute or subacute ischemia. Gray-white matter differentiation maintained. No other acute or chronic intracranial blood products. No mass lesion, midline shift or mass effect. No hydrocephalus or extra-axial fluid collection. Pituitary gland suprasellar region within normal limits. Vascular: Major intracranial vascular flow voids are maintained. Skull and upper cervical spine: Cranial junction within normal limits. Bone marrow signal intensity normal. No scalp soft tissue abnormality. Sinuses/Orbits: Prior bilateral ocular lens replacement. Paranasal sinuses are largely clear. Small right mastoid effusion noted, of doubtful significance. Other: None. IMPRESSION: 1. No acute intracranial abnormality. 2. 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 Electronically Signed   By: Rise Mu M.D.   On: 04/26/2023 19:10   DG Chest Port 1 View  Result Date: 04/26/2023 CLINICAL DATA:  Sepsis EXAM: PORTABLE CHEST 1 VIEW  COMPARISON:  X-ray 07/24/2019 and older FINDINGS: Underinflation. Enlarged cardiopericardial silhouette with tortuous ectatic aorta. Stable widened mediastinum. No pneumothorax, effusion or edema. Overlapping cardiac leads. Degenerative changes of the spine. Film is rotated. IMPRESSION: Underinflation. Stable enlarged cardiopericardial silhouette with a widened mediastinum. Electronically Signed   By: Karen Kays M.D.   On: 04/26/2023 16:54   CT ANGIO HEAD NECK W WO CM (CODE STROKE)  Result Date: 04/26/2023 CLINICAL DATA:  Neuro deficit, acute, stroke suspected EXAM: CT ANGIOGRAPHY HEAD AND NECK WITH AND WITHOUT CONTRAST TECHNIQUE: Multidetector CT imaging of the head and neck was performed using the standard protocol during bolus administration of intravenous contrast. Multiplanar CT image reconstructions and MIPs were obtained to evaluate the vascular anatomy. Carotid stenosis measurements (when applicable) are obtained utilizing NASCET criteria, using the distal internal carotid diameter as the denominator. RADIATION  DOSE REDUCTION: This exam was performed according to the departmental dose-optimization program which includes automated exposure control, adjustment of the mA and/or kV according to patient size and/or use of iterative reconstruction technique. CONTRAST:  75mL OMNIPAQUE IOHEXOL 350 MG/ML SOLN COMPARISON:  Same day CT head FINDINGS: See same day CT head for intracranial findings. CTA NECK FINDINGS Aortic arch: Standard branching. Imaged portion shows no evidence of aneurysm or dissection. No significant stenosis of the major arch vessel origins. Right carotid system: No evidence of dissection, stenosis (50% or greater), or occlusion. Left carotid system: No evidence of dissection, stenosis (50% or greater), or occlusion. Vertebral arteries: Right-dominant. There is mild narrowing of the origin of the left vertebral artery which arises from the aortic arch. The left vertebral artery terminates as  a PICA. No evidence of dissection, stenosis (50% or greater), or occlusion. Skeleton: Negative.1 Other neck: Negative. Upper chest: Right apical pleural-parenchymal scarring. Review of the MIP images confirms the above findings CTA HEAD FINDINGS Anterior circulation: No significant stenosis, proximal occlusion, aneurysm, or vascular malformation. Posterior circulation: No significant stenosis, proximal occlusion, aneurysm, or vascular malformation. Venous sinuses: As permitted by contrast timing, patent. Anatomic variants: None Review of the MIP images confirms the above findings IMPRESSION: 1. No intracranial large vessel occlusion or significant stenosis. 2. No hemodynamically significant stenosis in the neck. 3. See same day CT head for intracranial findings. Electronically Signed   By: Lorenza Cambridge M.D.   On: 04/26/2023 14:28   CT HEAD CODE STROKE WO CONTRAST  Result Date: 04/26/2023 CLINICAL DATA:  Code stroke. Right-sided facial droop and arm weakness. EXAM: CT HEAD WITHOUT CONTRAST TECHNIQUE: Contiguous axial images were obtained from the base of the skull through the vertex without intravenous contrast. RADIATION DOSE REDUCTION: This exam was performed according to the departmental dose-optimization program which includes automated exposure control, adjustment of the mA and/or kV according to patient size and/or use of iterative reconstruction technique. COMPARISON:  CT Head 03/09/19 FINDINGS: Brain: No evidence of acute infarction, hemorrhage, hydrocephalus, extra-axial collection or mass lesion/mass effect. Sequela of moderate chronic microvascular ischemic change with chronic left frontal lobe infarct. Vascular: No hyperdense vessel or unexpected calcification. Skull: Normal. Negative for fracture or focal lesion. Sinuses/Orbits: 2 no middle ear or mastoid effusion. Paranasal sinuses are clear. Bilateral lens replacement. Orbits are otherwise Other: None. ASPECTS [facility] Stroke Program Early CT  Score): 10 when accounting for chronic findings. IMPRESSION: No hemorrhage or CT evidence of an acute cortical infarct. Aspects is 10 when accounting for chronic findings. Findings were paged to Dr. Amada Jupiter on 04/26/2023 at 2:20 p.m via [facility] paging system. Electronically Signed   By: Lorenza Cambridge M.D.   On: 04/26/2023 14:22     EKG: personally reviewed my interpretation is normal sinus rhythm, normal axis. Consistent with prior EKG.  ASSESSMENT & PLAN:   Assessment & Plan by Problem: Principal Problem:   Acute encephalopathy   HAN LYSNE is a 82 y.o. female with pertinent PMH of mild dementia, schizophrenia, HTN who presented with AMS and code stroke and is admitted for Acute encephalopathy in setting of COVID-19 infectoin.  Acute Encephalopathy COVID-19 + Pt with mild dementia at baseline but still alert and convsationable but not oriented to place or time presenting with acute encephalopathy. Newly unable to follow commands, answer questions, or speak fluently. Stroke workup was initiated due to the impression of R facial droop and R side weakness but imaging including CT/CTA/MRI without acute changes. Patient found to be febrile  103F, tachypneic, and covid + during workup but only previous symptom per daughter was a sore throat. VSS. No other acute metabolic changes or toxic insults observed given unremarkable electrolytes, glucose, creatinine, ethanol, UDS. UA noninfectious. No acute structural abnormalitites though there are observed chronic left frontal infarct and chronic microvascular changes. Currently patient's acute COVID-19 infection is the most apparent insult causing encephalopathy.  Through she had a fever she has not exhibited respiratory distress or desaturations. Will plan for supportive treatment and reassess neurological status as she recovers. - Blood cultures pending - Tylenol given for fever, will reschedule prn  - continue home meds Albuterol inhaler prn -  Continuous cardiac monitoring per encephalopathy - Neuro checks q4h - Delirium precautions - Airborne and contact precautions - NPO per mental status  Dementia Schizaffective Disorder Bipolar Disorder Today patient is altered and unable to communicate fluently or respond to commands. Her baseline dementia, per her daughter, results in occasional disorientation but generally she is able to converse fluently. Will monitor for improvement toward her baseline. - continue Depakote delayed release sprinkle 125mg  TID in morning and QID at bedtime - conitinue home Prozac 40po every day - Continue home Gabapentin 400 TID  Chronic Stable Medical Conditions  HTN - continue home metoprolol tartrate 25mg  BID  Osteoarthritis Per daughter arthritis is severe and at time painful and limits her movement, particularly her arms - continue home Tylenol 325mg  q4h prn  Constipation - continue home miralax 17g every day - continue home Docusate sodium 100mg  every day  HLD - Atorvastatin 10mg  qd  Vit D Deficiency - continue home D3 1000u every day  History IDA Hg WNL 13.6, MCV 97.8. - continue home Ferrous sulfate 325mg  po every day  Hx of PE (2011) - continue home Xarelto 20 every day  Diet: NPO VTE: DOAC IVF: None,None Code: DNR  Dispo: Admit patient to Observation with expected length of stay less than 2 midnights.  Signed: Katheran James, DO Internal Medicine Resident PGY-1  04/26/2023, 10:37 PM   Dr. Laretta Bolster, MD Pager [phone]

## 2023-04-26 NOTE — ED Triage Notes (Signed)
Patient presents to ed via [facility] from SNF states she was last seen normal at 1057upon  their 30 min rounding. States patient is normally alert with some confusion is non ambulatory. Patient presents with right sided facial droop and right sided weakness , speech is slurred.

## 2023-04-26 NOTE — Hospital Course (Signed)
Facility called, testing because not responsive, urine BP high Normally  Used to walk but had bilateral femur fx, now wheelchair   8/4: Spoke to patient at bedside. Patient stated she feels fine. Patient can state her name and knows she is in the hospital, cannot name city. Complains of cold. Denies shortness of breath, dysuria, back pain, headaches. Complies with most instructions.

## 2023-04-26 NOTE — H&P (Addendum)
Date: 04/26/2023               Patient Name:  [name] [name] MRN: [id number]  DOB: Aug 29, 1941 Age / Sex: 82 y.o., female   PCP: Care, Jovita Kussmaul [facility]         Medical Service: Internal Medicine Teaching Service         Attending Physician: Dr. Inez Catalina, MD      First Contact: Dr. Laretta Bolster, MD Pager [phone]    Second Contact: Dr. Olegario Messier, MD Pager [phone]         After Hours (After 5p/  First Contact Pager: [phone]  weekends / holidays): Second Contact Pager: [phone]   SUBJECTIVE   Chief Complaint: AMS / code stroke  History of Present Illness:  [name] [name] is an 82 yo F with PMH mild dementia, schizoaffective and bipolar disorders, HTN presented to [facility] ED as code stroke. Her last known well was 11am. Around 1330 she was noted to have new R facial droop, R arm weakness, and slurred speech. She arrived to [facility] ED at 1404 via EMS. She underwent CT/MRI of the head that were negative for acute processes. Was found to be COVID positive and per daughter only previous complaint was sore throat and cough.  Further history limited. Since arrival, pt has displayed AMS with unintelligible speech and is unable to follow commands.  Per pts daughter: she is conversational and intelligible at her baseline but is disoriented to place and time at baseline. She is not ambulatory due to a fall in 2020 that fractured both femurs, so she uses a wheelchair. She has b/l arm stiffness at baseline due to severe arthritis.  Pt is resident of [facility] in [place]   ED Course: Arrived to [facility] ED at 1404 via EMS. Stroke-like symptoms noticed approx one half hour before at her facility in [place]. During workup, she was found to be febrile 103F and received tylenol. Non-con head CT and CTA head and neck were negative for acute changes and LVO. CXR negative for acute process, stable enlarge pericardial sihouette and widened mediastinum. Neurology consulted and recommended  MRI and EEG. MRI negative for acute abnormality, did show chronic L frontal infarct and chronic microvascular ischemic changes. EEG: abnormal and consistent with generalized brain dysfunction, nonspecific. Pt was found to be COVID positive. Ethanol and UDS negative. UA does not suggest infection. Blood cultures drawn. IMTS took over care.  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   Meds:  Albuterol inhaler Lidocain patch 4% Metolprolol Tartate 25mg  BID Miralax powder 17g po every day Prozac 40 po gd Tylenol 325mg  q4h prn D3 1000u every day Xarelto 20 every day (hx PE) Atorvastatin 10mg  Depakote delayed release sprinkle 125mg  3x in morning and 4x at bedtime Docusate sodium 100mg  every day Ferrous sulfate 325mg  po every day Gabapentin 400 TID  Past Surgical History Past Surgical History:  Procedure Laterality Date   CARPAL TUNNEL RELEASE Bilateral    DILATION AND CURETTAGE OF UTERUS  1970's   "once" (03/30/2013)   JOINT REPLACEMENT     ORIF FEMUR FRACTURE Left 03/10/2019   Procedure: OPEN REDUCTION INTERNAL FIXATION (ORIF) DISTAL FEMUR FRACTURE;  Surgeon: Myrene Galas, MD;  Location: [facility] OR;  Service: Orthopedics;  Laterality: Left;   SPINAL FUSION     2004   TOTAL KNEE ARTHROPLASTY Right 11/2008   TUBAL LIGATION  1973  VEIN SURGERY  left leg   Social:  Lives at [facility] in [place]  Support: daughter lives nearby Level of Function: dependent in ADLs and IADLs PCP: Care, Risk analyst [facility] Substances: Unable to obtain per AMS, chart review negative for tobacco, alcohol, illicit drugs  Family History:  Unable to obtain per AMS Family History  Problem Relation Age of Onset   Diabetes Sister    Colon cancer Brother 52   Colon cancer Brother      Allergies: NKDA  Review of Systems: A complete ROS was negative except as per HPI.   OBJECTIVE:    Physical Exam: Blood pressure 127/79, pulse 90, temperature (!) 103.2 F (39.6 C), temperature source Rectal, resp. rate (!) 22, height 5\' 5"  (1.651 m), weight 108.4 kg, last menstrual period 11/22/1996, SpO2 97%.  Constitutional: chronically ill appearing female in bed. In no acute distress. HENT: Normocephalic, atraumatic,  Eyes: Sclera non-icteric, PERRL, EOM intact Cardio:Regular rate and rhythm.  2+ bilateral radial and dorsalis pedis  pulses. Pulm: anterior lung fields clear to auscultation bilaterally. Normal work of breathing on room air. Abdomen: Soft, non-tender, non-distended, positive bowel sounds. HQI:ONGEXBMW for extremity edema. Muscle tightness in b/l UE and LE, resistant to passive movement. Skin:Warm and dry. Neuro: Limited due to AMS. Nonsensical speech. Cannot follow commands. AOx0. No facial droop. Eyes PERRL, EOM intact. No focal deficit noted but pt does not follow commands. Withdraws to pain in b/l LE. Unable to perform cerebellar testing. Psych:Confused and anxious  Labs: CBC    Component Value Date/Time   WBC 8.4 04/26/2023 1404   RBC 4.16 04/26/2023 1404   HGB 13.9 04/26/2023 1408   HGB 11.7 12/01/2013 1056   HCT 41.0 04/26/2023 1408   HCT 35.1 12/01/2013 1056   PLT 135 (L) 04/26/2023 1404   PLT 241 12/01/2013 1056   MCV 97.6 04/26/2023 1404   MCV 94.7 12/01/2013 1056   MCH 32.7 04/26/2023 1404   MCHC 33.5 04/26/2023 1404   RDW 13.7 04/26/2023 1404   RDW 14.4 12/01/2013 1056   LYMPHSABS 0.5 (L) 04/26/2023 1404   LYMPHSABS 1.1 12/01/2013 1056   MONOABS 0.4 04/26/2023 1404   MONOABS 0.3 12/01/2013 1056   EOSABS 0.0 04/26/2023 1404   EOSABS 0.2 12/01/2013 1056   BASOSABS 0.0 04/26/2023 1404   BASOSABS 0.0 12/01/2013 1056     CMP     Component Value Date/Time   NA 137 04/26/2023 1408   NA 138 12/01/2013 1056   K 4.0 04/26/2023 1408   K 3.9 12/01/2013 1056   CL 100 04/26/2023 1408   CO2 27 04/26/2023 1404   CO2 28 12/01/2013 1056   GLUCOSE  119 (H) 04/26/2023 1408   GLUCOSE 104 12/01/2013 1056   BUN 10 04/26/2023 1408   BUN 15.4 12/01/2013 1056   CREATININE 0.70 04/26/2023 1408   CREATININE 0.9 12/01/2013 1056   CALCIUM 9.1 04/26/2023 1404   CALCIUM 9.3 12/01/2013 1056   PROT 8.3 (H) 04/26/2023 1404   PROT 8.0 05/12/2013 1028   ALBUMIN 3.3 (L) 04/26/2023 1404   ALBUMIN 3.6 05/12/2013 1028   AST 13 (L) 04/26/2023 1404   AST 21 05/12/2013 1028   ALT 8 04/26/2023 1404   ALT 21 05/12/2013 1028   ALKPHOS 60 04/26/2023 1404   ALKPHOS 98 05/12/2013 1028   BILITOT 0.4 04/26/2023 1404   BILITOT 0.27 05/12/2013 1028   GFRNONAA >60 04/26/2023 1404   GFRAA >60 03/11/2019 0623    Imaging: EEG adult  Result Date: 04/26/2023 Windell Norfolk, MD     04/26/2023  9:29 PM History: 82 year old woman with dementia presenting with altered mental status. EEG to rule out seizure EEG classification: Awake and drowsy Duration: 23 minutes Technical aspects: This EEG study was done with scalp electrodes positioned according to the 10-20 International system of electrode placement. Electrical activity was reviewed with band pass filter of 1-70Hz , sensitivity of 7 uV/mm, display speed of 3mm/sec with a 60Hz  notched filter applied as appropriate. EEG data were recorded continuously and digitally stored. Description of the recording: The background rhythms of this recording consists of a fairly well modulated medium amplitude delta-theta activity. Present in the anterior head region is a 15-20 Hz beta activity. Photic stimulation was not performed. Hyperventilation was not performed. No abnormal epileptiform discharges seen during this recording. There was mild to moderate diffuse slowing with sharp contoured waves. There was increase myogenic artifact. There were no electrographic seizure identified. Abnormality: Mild to moderate diffuse slowing Impression: This is an abnormal EEG recorded while drowsy and awake due to mild to moderate diffuse slowing which  is consistent with generalized brain dysfunction, nonspecific.  Windell Norfolk, MD [facility]   MR Brain Wo Contrast (neuro protocol)  Result Date: 04/26/2023 CLINICAL DATA:  Initial evaluation for neuro deficit, stroke. EXAM: MRI HEAD WITHOUT CONTRAST TECHNIQUE: Multiplanar, multiecho pulse sequences of the brain and surrounding structures were obtained without intravenous contrast. COMPARISON:  CT from earlier the same day. FINDINGS: Brain: Examination degraded by motion. Cerebral volume within normal limits. T2/FLAIR hyperintensity involving the periventricular deep white matter both cerebral hemispheres, consistent with chronic small vessel ischemic disease. Encephalomalacia and gliosis at the anterior left frontal lobe consistent with prior infarct. Minimal chronic hemosiderin staining at this location. No evidence for acute or subacute ischemia. Gray-white matter differentiation maintained. No other acute or chronic intracranial blood products. No mass lesion, midline shift or mass effect. No hydrocephalus or extra-axial fluid collection. Pituitary gland suprasellar region within normal limits. Vascular: Major intracranial vascular flow voids are maintained. Skull and upper cervical spine: Cranial junction within normal limits. Bone marrow signal intensity normal. No scalp soft tissue abnormality. Sinuses/Orbits: Prior bilateral ocular lens replacement. Paranasal sinuses are largely clear. Small right mastoid effusion noted, of doubtful significance. Other: None. IMPRESSION: 1. No acute intracranial abnormality. 2. 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 Electronically Signed   By: Rise Mu M.D.   On: 04/26/2023 19:10   DG Chest Port 1 View  Result Date: 04/26/2023 CLINICAL DATA:  Sepsis EXAM: PORTABLE CHEST 1 VIEW COMPARISON:  X-ray 07/24/2019 and older FINDINGS: Underinflation. Enlarged cardiopericardial silhouette with  tortuous ectatic aorta. Stable widened mediastinum. No pneumothorax, effusion or edema. Overlapping cardiac leads. Degenerative changes of the spine. Film is rotated. IMPRESSION: Underinflation. Stable enlarged cardiopericardial silhouette with a widened mediastinum. Electronically Signed   By: Karen Kays M.D.   On: 04/26/2023 16:54   CT ANGIO HEAD NECK W WO CM (CODE STROKE)  Result Date: 04/26/2023 CLINICAL DATA:  Neuro deficit, acute, stroke suspected EXAM: CT ANGIOGRAPHY HEAD AND NECK WITH AND WITHOUT CONTRAST TECHNIQUE: Multidetector CT imaging of the head and neck was performed using the standard protocol during bolus administration of intravenous contrast. Multiplanar CT image reconstructions and MIPs were obtained to evaluate the vascular anatomy. Carotid stenosis measurements (when applicable) are obtained utilizing NASCET criteria, using the distal internal carotid diameter as the denominator. RADIATION DOSE REDUCTION: This exam was performed according to the departmental dose-optimization  program which includes automated exposure control, adjustment of the mA and/or kV according to patient size and/or use of iterative reconstruction technique. CONTRAST:  75mL OMNIPAQUE IOHEXOL 350 MG/ML SOLN COMPARISON:  Same day CT head FINDINGS: See same day CT head for intracranial findings. CTA NECK FINDINGS Aortic arch: Standard branching. Imaged portion shows no evidence of aneurysm or dissection. No significant stenosis of the major arch vessel origins. Right carotid system: No evidence of dissection, stenosis (50% or greater), or occlusion. Left carotid system: No evidence of dissection, stenosis (50% or greater), or occlusion. Vertebral arteries: Right-dominant. There is mild narrowing of the origin of the left vertebral artery which arises from the aortic arch. The left vertebral artery terminates as a PICA. No evidence of dissection, stenosis (50% or greater), or occlusion. Skeleton: Negative.1 Other neck:  Negative. Upper chest: Right apical pleural-parenchymal scarring. Review of the MIP images confirms the above findings CTA HEAD FINDINGS Anterior circulation: No significant stenosis, proximal occlusion, aneurysm, or vascular malformation. Posterior circulation: No significant stenosis, proximal occlusion, aneurysm, or vascular malformation. Venous sinuses: As permitted by contrast timing, patent. Anatomic variants: None Review of the MIP images confirms the above findings IMPRESSION: 1. No intracranial large vessel occlusion or significant stenosis. 2. No hemodynamically significant stenosis in the neck. 3. See same day CT head for intracranial findings. Electronically Signed   By: Lorenza Cambridge M.D.   On: 04/26/2023 14:28   CT HEAD CODE STROKE WO CONTRAST  Result Date: 04/26/2023 CLINICAL DATA:  Code stroke. Right-sided facial droop and arm weakness. EXAM: CT HEAD WITHOUT CONTRAST TECHNIQUE: Contiguous axial images were obtained from the base of the skull through the vertex without intravenous contrast. RADIATION DOSE REDUCTION: This exam was performed according to the departmental dose-optimization program which includes automated exposure control, adjustment of the mA and/or kV according to patient size and/or use of iterative reconstruction technique. COMPARISON:  CT Head 03/09/19 FINDINGS: Brain: No evidence of acute infarction, hemorrhage, hydrocephalus, extra-axial collection or mass lesion/mass effect. Sequela of moderate chronic microvascular ischemic change with chronic left frontal lobe infarct. Vascular: No hyperdense vessel or unexpected calcification. Skull: Normal. Negative for fracture or focal lesion. Sinuses/Orbits: 2 no middle ear or mastoid effusion. Paranasal sinuses are clear. Bilateral lens replacement. Orbits are otherwise Other: None. ASPECTS [facility] Stroke Program Early CT Score): 10 when accounting for chronic findings. IMPRESSION: No hemorrhage or CT evidence of an acute cortical  infarct. Aspects is 10 when accounting for chronic findings. Findings were paged to Dr. Amada Jupiter on 04/26/2023 at 2:20 p.m via [facility] paging system. Electronically Signed   By: Lorenza Cambridge M.D.   On: 04/26/2023 14:22     EKG: personally reviewed my interpretation is normal sinus rhythm, normal axis. Consistent with prior EKG.  ASSESSMENT & PLAN:   Assessment & Plan by Problem: Principal Problem:   Acute encephalopathy   TYRISHA BENNINGER is a 82 y.o. female with pertinent PMH of mild dementia, schizophrenia, HTN who presented with AMS and code stroke and is admitted for Acute encephalopathy in setting of COVID-19 infectoin.  Acute Encephalopathy COVID-19 + Pt with mild dementia at baseline but still alert and convsationable but not oriented to place or time presenting with acute encephalopathy. Newly unable to follow commands, answer questions, or speak fluently. Stroke workup was initiated due to the impression of R facial droop and R side weakness but imaging including CT/CTA/MRI without acute changes. Patient found to be febrile 103F, tachypneic, and covid + during workup but only previous symptom  per daughter was a sore throat. VSS. No other acute metabolic changes or toxic insults observed given unremarkable electrolytes, glucose, creatinine, ethanol, UDS. UA noninfectious. No acute structural abnormalitites though there are observed chronic left frontal infarct and chronic microvascular changes. Currently patient's acute COVID-19 infection is the most apparent insult causing encephalopathy.  No oxygen requirement, infiltrate on imaging, immunodeficiency, lung disease, or other risk factors for severe dz aside from her age and microvascular disease. Will plan for supportive treatment and reassess neurological status as she recovers. If she has a new oxygen requirement or otherwise worsens then we will consider starting remdesivir. Paxlovid is contraindicated with her xarelto.  - Blood cultures  pending - Tylenol given for fever, will reschedule prn  - continue home meds Albuterol inhaler prn - Continuous cardiac monitoring per encephalopathy - Neuro checks q4h - Delirium precautions - Airborne and contact precautions - NPO per mental status, bedside swallow eval when ready, also SLP eval tomorrow  Dementia Schizaffective Disorder Bipolar Disorder Today patient is altered and unable to communicate fluently or respond to commands. Her baseline dementia, per her daughter, is disoriented to place and time but generally she is able to converse fluently. Will monitor for improvement toward her baseline. Restarted home medications orally but if she is unable to swallow in the morning we will make changes. - continue Depakote delayed release sprinkle 125mg  TID in morning and QID at bedtime - conitinue home Prozac 40po every day - Continue home Gabapentin 400 TID  Chronic Stable Medical Conditions  HTN - continue home metoprolol tartrate 25mg  BID  Osteoarthritis Per daughter arthritis is severe and at time painful and limits her movement, particularly her arms - tylenol 650 mg q6h prn  Constipation - continue home miralax 17g every day - continue home Docusate sodium 100mg  every day  HLD - Atorvastatin 10mg  qd  Vit D Deficiency - continue home D3 1000u every day  History IDA Hg WNL 13.6, MCV 97.8. - continue home Ferrous sulfate 325mg  po every day  Hx of PE (2011) - continue home Xarelto 20 every day  Diet: NPO VTE: DOAC IVF: None,None Code: DNR  Dispo: Admit patient to Observation with expected length of stay less than 2 midnights.  Signed: Katheran James, DO Internal Medicine Resident PGY-1  04/26/2023, 10:37 PM   Dr. Laretta Bolster, MD Pager [phone]

## 2023-04-26 NOTE — ED Notes (Signed)
Gone to MRI

## 2023-04-26 NOTE — Consult Note (Signed)
Neurology Consultation Reason for Consult: Altered mental status Referring Physician: Theresia Lo, V  CC: Altered mental status  History is obtained from: Nursing home  HPI: [name] [name] is a 82 y.o. female with a history of dementia baseline she is conversant, able to have a cognizant conversation and able to interact relatively normally.  She is not always completely oriented.  She is nonambulatory at baseline.   Today, she was relatively normal this morning, but was noted to be confused around 11 AM.  She is not a candidate for thrombolytic therapy due to being on Eliquis, and there is no LVO.   LKW: 10:57 AM tnk given?: no, anticoagulated  Past Medical History:  Diagnosis Date   Anxiety    Arthritis    "joints" (03/30/2013)   Asthma    Chest pain, exertional    Chronic bronchitis (HCC)    Chronic pain    "over my whole body" (03/30/2013)   Complication of anesthesia    "because I have sleep apnea" (03/30/2013)   Daily headache    "last 2 months" (03/30/2013)   Depression    DVT (deep venous thrombosis) (HCC)    per 01/17/10 d/c summary- "remote hx of dvt"?   Exertional shortness of breath    "& sometimes when laying down" (03/30/2013)   GERD (gastroesophageal reflux disease)    Glaucoma    Heart murmur    Hemorrhoids    Hyperlipidemia    Hypertension    Iron deficiency anemia    Pneumonia    "a few times" (03/30/2013)   Psoriasis 04/29/2012   New onset evaluated by Dr Marylou Flesher, Dermatology, [name] 6/13  Rx 0.1% Tacrolimus ointment   Psoriasis    Pulmonary embolism (HCC) 12/2009   Large central bilateral PE's    Schizophrenia (HCC)    Sleep apnea    "waiting on my CPAP" (03/30/2013)   Varicose veins of legs      Family History  Problem Relation Age of Onset   Diabetes Sister    Colon cancer Brother 3   Colon cancer Brother      Social History:  reports that she has never smoked. She has never used smokeless tobacco. She reports that she does not drink  alcohol and does not use drugs.   Exam: Current vital signs: BP 127/79   Pulse 90   Temp (!) 101.8 F (38.8 C) (Rectal)   Resp (!) 22   Ht 5\' 5"  (1.651 m)   Wt 108.4 kg   LMP 11/22/1996   SpO2 97%   BMI 39.77 kg/m  Vital signs in last 24 hours: Temp:  [101.8 F (38.8 C)] 101.8 F (38.8 C) (08/02 1458) Pulse Rate:  [78-90] 90 (08/02 1803) Resp:  [20-25] 22 (08/02 1803) BP: (127-174)/(79-101) 127/79 (08/02 1803) SpO2:  [94 %-99 %] 97 % (08/02 1803) Weight:  [108.4 kg] 108.4 kg (08/02 1459)   Physical Exam  Appears well-developed and well-nourished.   Neuro: Mental Status: Patient is aphasic, she does have some answers that are somewhat understandable, but mostly it is gibberish. Cranial Nerves: II: Blinks to threat bilaterally. Pupils are reactive to light.   III,IV, VI: EOMI without ptosis or diploplia.  V: Facial sensation is symmetric to temperature VII: Facial movement is symmetric.  VIII: hearing is intact to voice X: Uvula elevates symmetrically XI: Shoulder shrug is symmetric. XII: tongue is midline without atrophy or fasciculations.  Motor: She has poor effort in bilateral lower extremities, she is able to  hold both arms against gravity. Sensory:   She response to noxious stimulation bilaterally Cerebellar: She does not perform     I have reviewed labs in epic and the results pertinent to this consultation are: COVID-positive CMP unremarkable  I have reviewed the images obtained: CT/CTA-negative  Impression: 82 year old female with history of dementia with altered mental status in the setting of COVID.  Though the report is relatively abrupt onset, I do wonder if she had some progressive confusion that was simply not noticed.  Given the reported abrupt onset, I do think MRI and EEG are reasonable, but if these are negative I would treat this as COVID encephalopathy.  Recommendations: 1) MRI brain 2) EEG 3) if negative, then I would treat this as  COVID-related delirium in this patient with known dementia   Ritta Slot, MD [facility] [phone]  If 7pm- 7am, please page neurology on call as listed in AMION.

## 2023-04-26 NOTE — Procedures (Signed)
  History:  82 year old woman with dementia presenting with altered mental status. EEG to rule out seizure  EEG classification: Awake and drowsy  Duration: 23 minutes  Technical aspects: This EEG study was done with scalp electrodes positioned according to the 10-20 International system of electrode placement. Electrical activity was reviewed with band pass filter of 1-70Hz , sensitivity of 7 uV/mm, display speed of 69mm/sec with a 60Hz  notched filter applied as appropriate. EEG data were recorded continuously and digitally stored.   Description of the recording: The background rhythms of this recording consists of a fairly well modulated medium amplitude delta-theta activity. Present in the anterior head region is a 15-20 Hz beta activity. Photic stimulation was not performed. Hyperventilation was not performed. No abnormal epileptiform discharges seen during this recording. There was mild to moderate diffuse slowing with sharp contoured waves. There was increase myogenic artifact. There were no electrographic seizure identified.   Abnormality: Mild to moderate diffuse slowing  Impression: This is an abnormal EEG recorded while drowsy and awake due to mild to moderate diffuse slowing which is consistent with generalized brain dysfunction, nonspecific.     Windell Norfolk, MD [facility]

## 2023-04-26 NOTE — Progress Notes (Addendum)
EEG complete - results pending 

## 2023-04-26 NOTE — ED Provider Notes (Signed)
4:09 PM Care assumed for Dr. Theresia Lo.  At time of transfer of care, patient is waiting for results of MRI and neurology recommendations for further management.  Anticipate follow-up on neurology recommendations about admission.  5:45 PM Patient's COVID test is positive.  Suspect this is contributing to the altered middle status and speech change.  Neurology still wants her to get an MRI but now that we had a source for this fever and potentially contribute to mental status, will call for admission for altered mental status and speech change in the setting of COVID-19 infection.  Clinical Impression: 1. Speech problem   2. COVID-19     Disposition: Admit  This note was prepared with assistance of Dragon voice recognition software. Occasional wrong-word or sound-a-like substitutions may have occurred due to the inherent limitations of voice recognition software.       Kloee Ballew, Canary Brim, MD 04/26/23 [phone]

## 2023-04-26 NOTE — Code Documentation (Signed)
Stroke Response Code Documentation  Code stroke activated by [facility] after a LKW 1057 at [facility] in [place]. Complaints of right facial droop, right side weakness and slurred speech. History of dementia, speech is normally clear and baseline does not walk. Takes Eliquis daily, last dose this AM.   Stroke team at the bedside on patient arrival. Labs drawn and patient cleared for CT by EDP. Patient to CT with team. NIHSS 17, see flowsheet for details. The following imaging was completed:  CT Head and CTA. Patient is not a candidate for IV Thrombolytic due to on Eliquis. Patient is not a candidate for IR due to no LVO.   Care Plan: q2h NIH and vitals. Bedside handoff with ED RN Tresa Endo.    Scarlette Slice K  Rapid Response RN

## 2023-04-26 NOTE — ED Provider Notes (Signed)
[place] EMERGENCY DEPARTMENT AT [facility] Provider Note   CSN: [id number] Arrival date & time: 04/26/23  1404  An emergency department physician performed an initial assessment on this suspected stroke patient at 1404.  History  Chief Complaint  Patient presents with   Code Stroke    [name] [name] is a 82 y.o. female.  Patient is an 82 year old female with a past medical history of mild dementia, schizophrenia, hypertension resenting to the emergency department as a stroke alert.  Patient came from her nursing home and EMS reports that normally she is conversational at baseline and around 11 AM she was last seen well.  About 30 minutes prior to arrival she was noted to have slurred speech with concern for right-sided facial droop and right arm weakness and 911 was called.  Further history is limited due to patient's critical acuity and slurred speech.  The history is provided by the EMS personnel. The history is limited by the condition of the patient.       Home Medications Prior to Admission medications   Medication Sig Start Date End Date Taking? Authorizing Provider  acetaminophen (TYLENOL) 325 MG tablet Take 650 mg by mouth 3 (three) times daily. For pain    [provider]  amantadine (SYMMETREL) 100 MG capsule Take 100 mg by mouth 2 (two) times a day. 02/19/19   [provider]  amLODipine (NORVASC) 10 MG tablet Take 10 mg by mouth daily.    [provider]  divalproex (DEPAKOTE) 250 MG DR tablet Take 250 mg by mouth every morning. 09/06/16   [provider]  divalproex (DEPAKOTE) 500 MG DR tablet Take 500 mg by mouth at bedtime. 09/05/16   [provider]  docusate sodium (COLACE) 100 MG capsule Take 100 mg by mouth daily.    [provider]  doxycycline (VIBRAMYCIN) 100 MG capsule Take 100 mg by mouth 2 (two) times daily. For 14 days Started on 6.11.20 Ends on 6.25.20    [provider]   FLUoxetine (PROZAC) 20 MG capsule Take 60 mg by mouth daily. Taking 3 capsules (20mg ) = 60mg  daily    [provider]  gabapentin (NEURONTIN) 100 MG capsule Take 500 mg by mouth 3 (three) times daily. Taking 5 (100mg ) capsules three times daily 03/05/19   [provider]  guaiFENesin (MUCINEX) 600 MG 12 hr tablet Take 600 mg by mouth 2 (two) times daily as needed for cough or to loosen phlegm.    [provider]  hydrochlorothiazide (HYDRODIURIL) 12.5 MG tablet Take 12.5 mg by mouth daily. 02/13/19   [provider]  hydroxypropyl methylcellulose / hypromellose (ISOPTO TEARS / GONIOVISC) 2.5 % ophthalmic solution Place 1 drop into both eyes 4 (four) times daily.    [provider]  INCRUSE ELLIPTA 62.5 MCG/INH AEPB Inhale 1 puff into the lungs daily. 02/25/19   [provider]  LACTOBACILLUS PO Take 1 capsule by mouth daily.    [provider]  LATUDA 40 MG TABS tablet Take 40 mg by mouth daily. 03/05/19   [provider]  Lidocaine 4 % PTCH Apply 1 patch topically daily.    [provider]  metoprolol tartrate (LOPRESSOR) 25 MG tablet Take 25 mg by mouth daily. morning 02/25/19   [provider]  pantoprazole (PROTONIX) 20 MG tablet Take 20 mg by mouth daily. 02/17/19   [provider]  phenylephrine-shark liver oil-mineral oil-petrolatum (PREPARATION H) 0.25-3-14-71.9 % rectal ointment Place 1 application rectally  every 6 (six) hours as needed for hemorrhoids.    [provider]  polyethylene glycol (MIRALAX / GLYCOLAX) 17 g packet Take 17 g by mouth daily.    [provider]  rivaroxaban (XARELTO) 20 MG TABS tablet Take 20 mg by mouth daily with supper.    [provider]  rivastigmine (EXELON) 9.5 mg/24hr Place 9.5 mg onto the skin daily.    [provider]  traMADol (ULTRAM) 50 MG tablet Take 50 mg by mouth 3 (three) times daily.    [provider]  traZODone  (DESYREL) 100 MG tablet Take 1 tablet (100 mg total) by mouth at bedtime. Patient taking differently: Take 50 mg by mouth at bedtime.  10/02/15   Charm Rings, NP  Vitamin D, Ergocalciferol, (DRISDOL) 1.25 MG (50000 UT) CAPS capsule Take 50,000 Units by mouth every 14 (fourteen) days.    [provider]      Allergies    Patient has no known allergies.    Review of Systems   Review of Systems  Physical Exam Updated Vital Signs BP (!) 161/92 (BP Location: Right Arm)   Pulse 83   Temp (!) 101.8 F (38.8 C) (Rectal)   Resp 20   Ht 5\' 5"  (1.651 m)   Wt 108.4 kg   LMP 11/22/1996   SpO2 99%   BMI 39.77 kg/m  Physical Exam Vitals and nursing note reviewed.  Constitutional:      General: She is not in acute distress.    Appearance: Normal appearance.  HENT:     Head: Normocephalic and atraumatic.     Nose: Nose normal.     Mouth/Throat:     Mouth: Mucous membranes are moist.     Pharynx: Oropharynx is clear.  Eyes:     Extraocular Movements: Extraocular movements intact.     Conjunctiva/sclera: Conjunctivae normal.     Pupils: Pupils are equal, round, and reactive to light.  Cardiovascular:     Rate and Rhythm: Normal rate and regular rhythm.     Heart sounds: Normal heart sounds.  Pulmonary:     Effort: Pulmonary effort is normal.     Breath sounds: Normal breath sounds.  Abdominal:     General: Abdomen is flat.     Palpations: Abdomen is soft.     Tenderness: There is no abdominal tenderness.  Musculoskeletal:     Cervical back: Normal range of motion.     Comments: Mild contracted appearance of RUE  Skin:    General: Skin is warm and dry.  Neurological:     Mental Status: She is alert.     Comments: Non-sensible speech, slurred No obvious facial droop Drift in bilateral UE, appears to have somewhat of contracted RUE Withdrawals to pain in bilateral LE  Psychiatric:        Mood and Affect: Mood normal.        Behavior: Behavior normal.     ED  Results / Procedures / Treatments   Labs (all labs ordered are listed, but only abnormal results are displayed) Labs Reviewed  URINALYSIS, W/ REFLEX TO CULTURE (INFECTION SUSPECTED) - Abnormal; Notable for the following components:      Result Value   Color, Urine STRAW (*)    Specific Gravity, Urine 1.039 (*)    Hgb urine dipstick SMALL (*)    All other components within normal limits  I-STAT CHEM 8, ED - Abnormal; Notable for the following components:   Glucose, Bld 119 (*)  Calcium, Ion 1.11 (*)    All other components within normal limits  CBG MONITORING, ED - Abnormal; Notable for the following components:   Glucose-Capillary 124 (*)    All other components within normal limits  CULTURE, BLOOD (ROUTINE X 2)  CULTURE, BLOOD (ROUTINE X 2)  SARS CORONAVIRUS 2 BY RT PCR  RAPID URINE DRUG SCREEN, HOSP PERFORMED  ETHANOL  PROTIME-INR  APTT  CBC  DIFFERENTIAL  COMPREHENSIVE METABOLIC PANEL  I-STAT CG4 LACTIC ACID, ED    EKG EKG Interpretation Date/Time:  Friday April 26 2023 14:28:27 EDT Ventricular Rate:  80 PR Interval:  203 QRS Duration:  85 QT Interval:  368 QTC Calculation: 425 R Axis:   35  Text Interpretation: Sinus rhythm Abnormal R-wave progression, early transition No significant change since last tracing Confirmed by Elayne Snare ([id number]) on 04/26/2023 3:03:06 PM  Radiology CT ANGIO HEAD NECK W WO CM (CODE STROKE)  Result Date: 04/26/2023 CLINICAL DATA:  Neuro deficit, acute, stroke suspected EXAM: CT ANGIOGRAPHY HEAD AND NECK WITH AND WITHOUT CONTRAST TECHNIQUE: Multidetector CT imaging of the head and neck was performed using the standard protocol during bolus administration of intravenous contrast. Multiplanar CT image reconstructions and MIPs were obtained to evaluate the vascular anatomy. Carotid stenosis measurements (when applicable) are obtained utilizing NASCET criteria, using the distal internal carotid diameter as the denominator. RADIATION DOSE  REDUCTION: This exam was performed according to the departmental dose-optimization program which includes automated exposure control, adjustment of the mA and/or kV according to patient size and/or use of iterative reconstruction technique. CONTRAST:  75mL OMNIPAQUE IOHEXOL 350 MG/ML SOLN COMPARISON:  Same day CT head FINDINGS: See same day CT head for intracranial findings. CTA NECK FINDINGS Aortic arch: Standard branching. Imaged portion shows no evidence of aneurysm or dissection. No significant stenosis of the major arch vessel origins. Right carotid system: No evidence of dissection, stenosis (50% or greater), or occlusion. Left carotid system: No evidence of dissection, stenosis (50% or greater), or occlusion. Vertebral arteries: Right-dominant. There is mild narrowing of the origin of the left vertebral artery which arises from the aortic arch. The left vertebral artery terminates as a PICA. No evidence of dissection, stenosis (50% or greater), or occlusion. Skeleton: Negative.1 Other neck: Negative. Upper chest: Right apical pleural-parenchymal scarring. Review of the MIP images confirms the above findings CTA HEAD FINDINGS Anterior circulation: No significant stenosis, proximal occlusion, aneurysm, or vascular malformation. Posterior circulation: No significant stenosis, proximal occlusion, aneurysm, or vascular malformation. Venous sinuses: As permitted by contrast timing, patent. Anatomic variants: None Review of the MIP images confirms the above findings IMPRESSION: 1. No intracranial large vessel occlusion or significant stenosis. 2. No hemodynamically significant stenosis in the neck. 3. See same day CT head for intracranial findings. Electronically Signed   By: Lorenza Cambridge M.D.   On: 04/26/2023 14:28   CT HEAD CODE STROKE WO CONTRAST  Result Date: 04/26/2023 CLINICAL DATA:  Code stroke. Right-sided facial droop and arm weakness. EXAM: CT HEAD WITHOUT CONTRAST TECHNIQUE: Contiguous axial images  were obtained from the base of the skull through the vertex without intravenous contrast. RADIATION DOSE REDUCTION: This exam was performed according to the departmental dose-optimization program which includes automated exposure control, adjustment of the mA and/or kV according to patient size and/or use of iterative reconstruction technique. COMPARISON:  CT Head 03/09/19 FINDINGS: Brain: No evidence of acute infarction, hemorrhage, hydrocephalus, extra-axial collection or mass lesion/mass effect. Sequela of moderate chronic microvascular ischemic change with chronic left frontal lobe  infarct. Vascular: No hyperdense vessel or unexpected calcification. Skull: Normal. Negative for fracture or focal lesion. Sinuses/Orbits: 2 no middle ear or mastoid effusion. Paranasal sinuses are clear. Bilateral lens replacement. Orbits are otherwise Other: None. ASPECTS [facility] Stroke Program Early CT Score): 10 when accounting for chronic findings. IMPRESSION: No hemorrhage or CT evidence of an acute cortical infarct. Aspects is 10 when accounting for chronic findings. Findings were paged to Dr. Amada Jupiter on 04/26/2023 at 2:20 p.m via [facility] paging system. Electronically Signed   By: Lorenza Cambridge M.D.   On: 04/26/2023 14:22    Procedures Procedures    Medications Ordered in ED Medications  acetaminophen (TYLENOL) tablet 650 mg (has no administration in time range)  iohexol (OMNIPAQUE) 350 MG/ML injection 75 mL (75 mLs Intravenous Contrast Given 04/26/23 1418)    ED Course/ Medical Decision Making/ A&P Clinical Course as of 04/26/23 1611  Fri Apr 26, 2023  1448 Rectal temp 103. Will undergo sepsis work up. Given Tylenol for fever. [VK]  1610 Patient signed out to Dr. Rush Landmark pending labs with plan for likely admission in the setting of altered mental status. [VK]    Clinical Course User Index [VK] Rexford Maus, DO                                 Medical Decision Making This patient presents to the  ED with chief complaint(s) of code stroke with pertinent past medical history of dementia, schizophrenia, HTN which further complicates the presenting complaint. The complaint involves an extensive differential diagnosis and also carries with it a high risk of complications and morbidity.    The differential diagnosis includes CVA, TIA, ICH, mass effect, infection, sepsis, electrolyte abnormality  Additional history obtained: Additional history obtained from EMS  Records reviewed Nursing Home Documents  ED Course and Reassessment: On patient's arrival to the emergency department she was mildly hypertensive otherwise hemodynamically stable in no acute distress, Accu-Chek within normal range.  She was made a prehospital arrival stroke alert and neurology was immediately present at bedside on patient's arrival.  She was immediately transported back to CT scan.  CT showed no acute abnormality, no LVO on CTA.  She is on anticoagulation so was deemed not a tPA candidate.  Neurology recommended further workup for alternative causes of altered mental status as well as MRI to evaluate for CVA.  Independent labs interpretation:  The following labs were independently interpreted: UA negative, labs pending  Independent visualization of imaging: - I independently visualized the following imaging with scope of interpretation limited to determining acute life threatening conditions related to emergency care: CTH/CTA, which revealed no acute abnormality  Consultation: - Consulted or discussed management/test interpretation w/ external professional: neurology   Amount and/or Complexity of Data Reviewed Labs: ordered. Radiology: ordered. ECG/medicine tests: ordered.  Risk OTC drugs.          Final Clinical Impression(s) / ED Diagnoses Final diagnoses:  None    Rx / DC Orders ED Discharge Orders     None         Rexford Maus, DO 04/26/23 1611

## 2023-04-26 NOTE — Progress Notes (Signed)
Pt in MRI, attempting eeg around 7:30

## 2023-04-26 NOTE — ED Notes (Addendum)
ED TO INPATIENT HANDOFF REPORT  ED Nurse Name and Phone #: Bette Brienza/ [phone]  S Name/Age/Gender [name] [name] 82 y.o. female Room/Bed: 003C/003C  Code Status   Code Status: DNR  Home/SNF/Other Skilled nursing facility Patient oriented to: Nothing... alert but disoriented x 4 Is this baseline? No   Triage Complete: Triage complete  Chief Complaint Acute encephalopathy [G93.40]  Triage Note Patient presents to ed via [facility] from SNF states she was last seen normal at 1057upon  their 30 min rounding. States patient is normally alert with some confusion is non ambulatory. Patient presents with right sided facial droop and right sided weakness , speech is slurred.    Allergies No Known Allergies  Level of Care/Admitting Diagnosis ED Disposition     ED Disposition  Admit   Condition  --   Comment  Hospital Area: [facility] [[id number]]  Level of Care: Telemetry Medical [104]  May place patient in observation at [facility] or [facility] if equivalent level of care is available:: Yes  Covid Evaluation: Confirmed COVID Positive  Diagnosis: Acute encephalopathy [784696]  Admitting Physician: Inez Catalina [[id number]]  Attending Physician: Nena Polio          B Medical/Surgery History Past Medical History:  Diagnosis Date   Anxiety    Arthritis    "joints" (03/30/2013)   Asthma    Chest pain, exertional    Chronic bronchitis (HCC)    Chronic pain    "over my whole body" (03/30/2013)   Complication of anesthesia    "because I have sleep apnea" (03/30/2013)   Daily headache    "last 2 months" (03/30/2013)   Depression    DVT (deep venous thrombosis) (HCC)    per 01/17/10 d/c summary- "remote hx of dvt"?   Exertional shortness of breath    "& sometimes when laying down" (03/30/2013)   GERD (gastroesophageal reflux disease)    Glaucoma    Heart murmur    Hemorrhoids    Hyperlipidemia    Hypertension    Iron deficiency anemia    Pneumonia     "a few times" (03/30/2013)   Psoriasis 04/29/2012   New onset evaluated by Dr Marylou Flesher, Dermatology, [facility] 6/13  Rx 0.1% Tacrolimus ointment   Psoriasis    Pulmonary embolism (HCC) 12/2009   Large central bilateral PE's    Schizophrenia (HCC)    Sleep apnea    "waiting on my CPAP" (03/30/2013)   Varicose veins of legs    Past Surgical History:  Procedure Laterality Date   CARPAL TUNNEL RELEASE Bilateral    DILATION AND CURETTAGE OF UTERUS  1970's   "once" (03/30/2013)   JOINT REPLACEMENT     ORIF FEMUR FRACTURE Left 03/10/2019   Procedure: OPEN REDUCTION INTERNAL FIXATION (ORIF) DISTAL FEMUR FRACTURE;  Surgeon: Myrene Galas, MD;  Location: [facility] OR;  Service: Orthopedics;  Laterality: Left;   SPINAL FUSION     2004   TOTAL KNEE ARTHROPLASTY Right 11/2008   TUBAL LIGATION  1973   VEIN SURGERY  left leg     A IV Location/Drains/Wounds Patient Lines/Drains/Airways Status     Active Line/Drains/Airways     Name Placement date Placement time Site Days   Peripheral IV 04/26/23 20 G Anterior;Left Forearm 04/26/23  1505  Forearm  less than 1   Incision (Closed) 03/10/19 Leg Left 03/10/19  1723  -- 1508            Intake/Output Last  24 hours No intake or output data in the 24 hours ending 04/26/23 2200  Labs/Imaging Results for orders placed or performed during the hospital encounter of 04/26/23 (from the past 48 hour(s))  CBG monitoring, ED     Status: Abnormal   Collection Time: 04/26/23  2:03 PM  Result Value Ref Range   Glucose-Capillary 124 (H) 70 - 99 mg/dL    Comment: Glucose reference range applies only to samples taken after fasting for at least 8 hours.  Ethanol     Status: None   Collection Time: 04/26/23  2:04 PM  Result Value Ref Range   Alcohol, Ethyl (B) <10 <10 mg/dL    Comment: (NOTE) Lowest detectable limit for serum alcohol is 10 mg/dL.  For medical purposes only. Performed at [facility], [address], [place], [place] [address]    Protime-INR     Status: Abnormal   Collection Time: 04/26/23  2:04 PM  Result Value Ref Range   Prothrombin Time 15.6 (H) 11.4 - 15.2 seconds   INR 1.2 0.8 - 1.2    Comment: (NOTE) INR goal varies based on device and disease states. Performed at [facility], [address], [place], [place] [address]   APTT     Status: None   Collection Time: 04/26/23  2:04 PM  Result Value Ref Range   aPTT 35 24 - 36 seconds    Comment: Performed at [facility], [address], [place], [place] [address]  CBC     Status: Abnormal   Collection Time: 04/26/23  2:04 PM  Result Value Ref Range   WBC 8.4 4.0 - 10.5 K/uL   RBC 4.16 3.87 - 5.11 MIL/uL   Hemoglobin 13.6 12.0 - 15.0 g/dL   HCT 57.3 22.0 - 25.4 %   MCV 97.6 80.0 - 100.0 fL   MCH 32.7 26.0 - 34.0 pg   MCHC 33.5 30.0 - 36.0 g/dL   RDW 27.0 62.3 - 76.2 %   Platelets 135 (L) 150 - 400 K/uL    Comment: REPEATED TO VERIFY   nRBC 0.0 0.0 - 0.2 %    Comment: Performed at [facility], [address] [place], [place] [address]  Differential     Status: Abnormal   Collection Time: 04/26/23  2:04 PM  Result Value Ref Range   Neutrophils Relative % 89 %   Neutro Abs 7.4 1.7 - 7.7 K/uL   Lymphocytes Relative 6 %   Lymphs Abs 0.5 (L) 0.7 - 4.0 K/uL   Monocytes Relative 4 %   Monocytes Absolute 0.4 0.1 - 1.0 K/uL   Eosinophils Relative 0 %   Eosinophils Absolute 0.0 0.0 - 0.5 K/uL   Basophils Relative 0 %   Basophils Absolute 0.0 0.0 - 0.1 K/uL   Immature Granulocytes 1 %   Abs Immature Granulocytes 0.09 (H) 0.00 - 0.07 K/uL    Comment: Performed at [facility], [address] [place], [place] [address]  Comprehensive metabolic panel     Status: Abnormal   Collection Time: 04/26/23  2:04 PM  Result Value Ref Range   Sodium 134 (L) 135 - 145 mmol/L   Potassium 4.3 3.5 - 5.1 mmol/L   Chloride 97 (L) 98 - 111 mmol/L   CO2 27 22 - 32 mmol/L   Glucose, Bld 100 (H) 70 - 99 mg/dL    Comment: Glucose reference  range applies only to samples taken after fasting for  at least 8 hours.   BUN 9 8 - 23 mg/dL   Creatinine, Ser 4.03 0.44 - 1.00 mg/dL   Calcium 9.1 8.9 - 47.4 mg/dL   Total Protein 8.3 (H) 6.5 - 8.1 g/dL   Albumin 3.3 (L) 3.5 - 5.0 g/dL   AST 13 (L) 15 - 41 U/L   ALT 8 0 - 44 U/L   Alkaline Phosphatase 60 38 - 126 U/L   Total Bilirubin 0.4 0.3 - 1.2 mg/dL   GFR, Estimated >25 >95 mL/min    Comment: (NOTE) Calculated using the CKD-EPI Creatinine Equation (2021)    Anion gap 10 5 - 15    Comment: Performed at [facility], [address], [place], [place] [address]  Urine rapid drug screen (hosp performed)     Status: None   Collection Time: 04/26/23  2:04 PM  Result Value Ref Range   Opiates NONE DETECTED NONE DETECTED   Cocaine NONE DETECTED NONE DETECTED   Benzodiazepines NONE DETECTED NONE DETECTED   Amphetamines NONE DETECTED NONE DETECTED   Tetrahydrocannabinol NONE DETECTED NONE DETECTED   Barbiturates NONE DETECTED NONE DETECTED    Comment: (NOTE) DRUG SCREEN FOR MEDICAL PURPOSES ONLY.  IF CONFIRMATION IS NEEDED FOR ANY PURPOSE, NOTIFY LAB WITHIN 5 DAYS.  LOWEST DETECTABLE LIMITS FOR URINE DRUG SCREEN Drug Class                     Cutoff (ng/mL) Amphetamine and metabolites    1000 Barbiturate and metabolites    200 Benzodiazepine                 200 Opiates and metabolites        300 Cocaine and metabolites        300 THC                            50 Performed at [facility], [address] [place], [place] [address]   Urinalysis, w/ Reflex to Culture (Infection Suspected) -Urine, Clean Catch     Status: Abnormal   Collection Time: 04/26/23  2:04 PM  Result Value Ref Range   Specimen Source URINE, CLEAN CATCH    Color, Urine STRAW (A) YELLOW   APPearance CLEAR CLEAR   Specific Gravity, Urine 1.039 (H) 1.005 - 1.030   pH 7.0 5.0 - 8.0   Glucose, UA NEGATIVE NEGATIVE mg/dL   Hgb urine dipstick SMALL (A) NEGATIVE   Bilirubin Urine NEGATIVE  NEGATIVE   Ketones, ur NEGATIVE NEGATIVE mg/dL   Protein, ur NEGATIVE NEGATIVE mg/dL   Nitrite NEGATIVE NEGATIVE   Leukocytes,Ua NEGATIVE NEGATIVE   RBC / HPF 11-20 0 - 5 RBC/hpf   WBC, UA 0-5 0 - 5 WBC/hpf    Comment:        Reflex urine culture not performed if WBC <=10, OR if Squamous epithelial cells >5. If Squamous epithelial cells >5 suggest recollection.    Bacteria, UA NONE SEEN NONE SEEN   Squamous Epithelial / HPF 0-5 0 - 5 /HPF   Mucus PRESENT     Comment: Performed at [facility], [address] [place], [place] [address]  I-stat chem 8, ED     Status: Abnormal   Collection Time: 04/26/23  2:08 PM  Result Value Ref Range   Sodium 137 135 - 145 mmol/L   Potassium 4.0 3.5 - 5.1 mmol/L   Chloride 100 98 -  111 mmol/L   BUN 10 8 - 23 mg/dL   Creatinine, Ser 4.09 0.44 - 1.00 mg/dL   Glucose, Bld 811 (H) 70 - 99 mg/dL    Comment: Glucose reference range applies only to samples taken after fasting for at least 8 hours.   Calcium, Ion 1.11 (L) 1.15 - 1.40 mmol/L   TCO2 27 22 - 32 mmol/L   Hemoglobin 13.9 12.0 - 15.0 g/dL   HCT 91.4 78.2 - 95.6 %  I-Stat Lactic Acid, ED     Status: None   Collection Time: 04/26/23  4:19 PM  Result Value Ref Range   Lactic Acid, Venous 1.8 0.5 - 1.9 mmol/L  SARS Coronavirus 2 by RT PCR (hospital order, performed in [facility] hospital lab) *cepheid single result test* Anterior Nasal Swab     Status: Abnormal   Collection Time: 04/26/23  4:21 PM   Specimen: Anterior Nasal Swab  Result Value Ref Range   SARS Coronavirus 2 by RT PCR POSITIVE (A) NEGATIVE    Comment: Performed at [facility], [address] [place], [place] [address]   EEG adult  Result Date: 04/26/2023 Windell Norfolk, MD     04/26/2023  9:29 PM History: 82 year old woman with dementia presenting with altered mental status. EEG to rule out seizure EEG classification: Awake and drowsy Duration: 23 minutes Technical aspects: This EEG study was done with scalp  electrodes positioned according to the 10-20 International system of electrode placement. Electrical activity was reviewed with band pass filter of 1-70Hz , sensitivity of 7 uV/mm, display speed of 11mm/sec with a 60Hz  notched filter applied as appropriate. EEG data were recorded continuously and digitally stored. Description of the recording: The background rhythms of this recording consists of a fairly well modulated medium amplitude delta-theta activity. Present in the anterior head region is a 15-20 Hz beta activity. Photic stimulation was not performed. Hyperventilation was not performed. No abnormal epileptiform discharges seen during this recording. There was mild to moderate diffuse slowing with sharp contoured waves. There was increase myogenic artifact. There were no electrographic seizure identified. Abnormality: Mild to moderate diffuse slowing Impression: This is an abnormal EEG recorded while drowsy and awake due to mild to moderate diffuse slowing which is consistent with generalized brain dysfunction, nonspecific.  Windell Norfolk, MD [facility]   MR Brain Wo Contrast (neuro protocol)  Result Date: 04/26/2023 CLINICAL DATA:  Initial evaluation for neuro deficit, stroke. EXAM: MRI HEAD WITHOUT CONTRAST TECHNIQUE: Multiplanar, multiecho pulse sequences of the brain and surrounding structures were obtained without intravenous contrast. COMPARISON:  CT from earlier the same day. FINDINGS: Brain: Examination degraded by motion. Cerebral volume within normal limits. T2/FLAIR hyperintensity involving the periventricular deep white matter both cerebral hemispheres, consistent with chronic small vessel ischemic disease. Encephalomalacia and gliosis at the anterior left frontal lobe consistent with prior infarct. Minimal chronic hemosiderin staining at this location. No evidence for acute or subacute ischemia. Gray-white matter differentiation maintained. No other acute or chronic  intracranial blood products. No mass lesion, midline shift or mass effect. No hydrocephalus or extra-axial fluid collection. Pituitary gland suprasellar region within normal limits. Vascular: Major intracranial vascular flow voids are maintained. Skull and upper cervical spine: Cranial junction within normal limits. Bone marrow signal intensity normal. No scalp soft tissue abnormality. Sinuses/Orbits: Prior bilateral ocular lens replacement. Paranasal sinuses are largely clear. Small right mastoid effusion noted, of doubtful significance. Other: None. IMPRESSION: 1. No acute intracranial abnormality. 2. 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 Electronically Signed   By: Rise Mu M.D.   On: 04/26/2023 19:10   DG Chest Port 1 View  Result Date: 04/26/2023 CLINICAL DATA:  Sepsis EXAM: PORTABLE CHEST 1 VIEW COMPARISON:  X-ray 07/24/2019 and older FINDINGS: Underinflation. Enlarged cardiopericardial silhouette with tortuous ectatic aorta. Stable widened mediastinum. No pneumothorax, effusion or edema. Overlapping cardiac leads. Degenerative changes of the spine. Film is rotated. IMPRESSION: Underinflation. Stable enlarged cardiopericardial silhouette with a widened mediastinum. Electronically Signed   By: Karen Kays M.D.   On: 04/26/2023 16:54   CT ANGIO HEAD NECK W WO CM (CODE STROKE)  Result Date: 04/26/2023 CLINICAL DATA:  Neuro deficit, acute, stroke suspected EXAM: CT ANGIOGRAPHY HEAD AND NECK WITH AND WITHOUT CONTRAST TECHNIQUE: Multidetector CT imaging of the head and neck was performed using the standard protocol during bolus administration of intravenous contrast. Multiplanar CT image reconstructions and MIPs were obtained to evaluate the vascular anatomy. Carotid stenosis measurements (when applicable) are obtained utilizing NASCET criteria, using the distal internal carotid diameter as the denominator. RADIATION DOSE REDUCTION: This exam was  performed according to the departmental dose-optimization program which includes automated exposure control, adjustment of the mA and/or kV according to patient size and/or use of iterative reconstruction technique. CONTRAST:  75mL OMNIPAQUE IOHEXOL 350 MG/ML SOLN COMPARISON:  Same day CT head FINDINGS: See same day CT head for intracranial findings. CTA NECK FINDINGS Aortic arch: Standard branching. Imaged portion shows no evidence of aneurysm or dissection. No significant stenosis of the major arch vessel origins. Right carotid system: No evidence of dissection, stenosis (50% or greater), or occlusion. Left carotid system: No evidence of dissection, stenosis (50% or greater), or occlusion. Vertebral arteries: Right-dominant. There is mild narrowing of the origin of the left vertebral artery which arises from the aortic arch. The left vertebral artery terminates as a PICA. No evidence of dissection, stenosis (50% or greater), or occlusion. Skeleton: Negative.1 Other neck: Negative. Upper chest: Right apical pleural-parenchymal scarring. Review of the MIP images confirms the above findings CTA HEAD FINDINGS Anterior circulation: No significant stenosis, proximal occlusion, aneurysm, or vascular malformation. Posterior circulation: No significant stenosis, proximal occlusion, aneurysm, or vascular malformation. Venous sinuses: As permitted by contrast timing, patent. Anatomic variants: None Review of the MIP images confirms the above findings IMPRESSION: 1. No intracranial large vessel occlusion or significant stenosis. 2. No hemodynamically significant stenosis in the neck. 3. See same day CT head for intracranial findings. Electronically Signed   By: Lorenza Cambridge M.D.   On: 04/26/2023 14:28   CT HEAD CODE STROKE WO CONTRAST  Result Date: 04/26/2023 CLINICAL DATA:  Code stroke. Right-sided facial droop and arm weakness. EXAM: CT HEAD WITHOUT CONTRAST TECHNIQUE: Contiguous axial images were obtained from the base  of the skull through the vertex without intravenous contrast. RADIATION DOSE REDUCTION: This exam was performed according to the departmental dose-optimization program which includes automated exposure control, adjustment of the mA and/or kV according to patient size and/or use of iterative reconstruction technique. COMPARISON:  CT Head 03/09/19 FINDINGS: Brain: No evidence of acute infarction, hemorrhage, hydrocephalus, extra-axial collection or mass lesion/mass effect. Sequela of moderate chronic microvascular ischemic change with chronic left frontal lobe infarct. Vascular: No hyperdense vessel or unexpected calcification. Skull: Normal. Negative for fracture or focal lesion. Sinuses/Orbits: 2 no middle ear or mastoid effusion. Paranasal sinuses are clear. Bilateral lens replacement. Orbits are otherwise Other: None. ASPECTS [facility] Stroke Program Early CT Score): 10 when accounting for chronic findings.  IMPRESSION: No hemorrhage or CT evidence of an acute cortical infarct. Aspects is 10 when accounting for chronic findings. Findings were paged to Dr. Amada Jupiter on 04/26/2023 at 2:20 p.m via [facility] paging system. Electronically Signed   By: Lorenza Cambridge M.D.   On: 04/26/2023 14:22    Pending Labs Unresulted Labs (From admission, onward)     Start     Ordered   04/26/23 1448  Blood Culture (routine x 2)  (Undifferentiated presentation (screening labs and basic nursing orders))  BLOOD CULTURE X 2,   STAT      04/26/23 1448            Vitals/Pain Today's Vitals   04/26/23 1803 04/26/23 1805 04/26/23 1926 04/26/23 2127  BP: 127/79 127/79    Pulse: 90 86    Resp: (!) 22 16    Temp:   (!) 103.2 F (39.6 C) (!) 100.7 F (38.2 C)  TempSrc:   Rectal Rectal  SpO2: 97% 97%    Weight:      Height:        Isolation Precautions Airborne and Contact precautions  Medications Medications  acetaminophen (TYLENOL) tablet 650 mg (650 mg Oral Not Given 04/26/23 1726)  enoxaparin (LOVENOX) injection  40 mg (has no administration in time range)  iohexol (OMNIPAQUE) 350 MG/ML injection 75 mL (75 mLs Intravenous Contrast Given 04/26/23 1418)  acetaminophen (TYLENOL) suppository 650 mg (650 mg Rectal Given 04/26/23 1939)    Mobility non-ambulatory     Focused Assessments Neuro Assessment Handoff:  Swallow screen pass? No    NIH Stroke Scale  Dizziness Present: No Headache Present: No Interval: Shift assessment Level of Consciousness (1a.)   : Alert, keenly responsive LOC Questions (1b. )   : Answers neither question correctly LOC Commands (1c. )   : Performs neither task correctly Best Gaze (2. )  : Partial gaze palsy Visual (3. )  : Complete hemianopia Facial Palsy (4. )    : Minor paralysis Motor Arm, Left (5a. )   : Some effort against gravity Motor Arm, Right (5b. ) : Some effort against gravity Motor Leg, Left (6a. )  : No effort against gravity Motor Leg, Right (6b. ) : No effort against gravity Limb Ataxia (7. ): Present in two limbs Sensory (8. )  : Normal, no sensory loss Best Language (9. )  : Mild-to-moderate aphasia Dysarthria (10. ): Mild-to-moderate dysarthria, patient slurs at least some words and, at worst, can be understood with some difficulty Extinction/Inattention (11.)   : No Abnormality Complete NIHSS TOTAL: 22 Last date known well: 04/26/23 Last time known well: 1057 Neuro Assessment: Exceptions to WDL Neuro Checks: Next Neuro/ NIH check at 12am  Initial (04/26/23 1430)  Has TPA been given? No If patient is a Neuro Trauma and patient is going to OR before floor call report to 4N Charge nurse: [phone] or [phone]   R Recommendations: See Admitting Provider Note  Report given to:  Additional Notes: Pt came from [facility] with R sided facial droop, R sided weakness, and slurred speech. Pt is covid positive. Pt had a 103.2 rectal temp and was given suppository tylenol. It came down to 100.7 rectally. Daughter went home. Pt wears a  brief and is non ambulatory. Pt has a 20 G in the L FA.

## 2023-04-27 DIAGNOSIS — G9341 Metabolic encephalopathy: Principal | ICD-10-CM

## 2023-04-27 DIAGNOSIS — U071 COVID-19: Secondary | ICD-10-CM

## 2023-04-27 NOTE — Progress Notes (Addendum)
Occupational Therapy Discharge Patient Details Name: [name] [name] MRN: [id number] DOB: 1941-05-09 Today's Date: 04/27/2023 Time:  -     Patient discharged from OT services secondary to  screen by OT. Pt is dependent for Adls and Iadls at baseline . SLP notes indicate staff feeding required  Please see latest therapy progress note for current level of functioning and progress toward goals.    Progress and discharge plan discussed with patient and/or caregiver: Patient unable to participate in discharge planning and no caregivers available  GO     Mateo Flow 04/27/2023, 2:06 PM

## 2023-04-27 NOTE — Evaluation (Signed)
Clinical/Bedside Swallow Evaluation Patient Details  Name: [name] [name] MRN: [id number] Date of Birth: Feb 03, 1941  Today's Date: 04/27/2023 Time: SLP Start Time (ACUTE ONLY): 1057 SLP Stop Time (ACUTE ONLY): 1116 SLP Time Calculation (min) (ACUTE ONLY): 19 min  Past Medical History:  Past Medical History:  Diagnosis Date   Anxiety    Arthritis    "joints" (03/30/2013)   Asthma    Chest pain, exertional    Chronic bronchitis (HCC)    Chronic pain    "over my whole body" (03/30/2013)   Complication of anesthesia    "because I have sleep apnea" (03/30/2013)   Daily headache    "last 2 months" (03/30/2013)   Depression    DVT (deep venous thrombosis) (HCC)    per 01/17/10 d/c summary- "remote hx of dvt"?   Exertional shortness of breath    "& sometimes when laying down" (03/30/2013)   GERD (gastroesophageal reflux disease)    Glaucoma    Heart murmur    Hemorrhoids    Hyperlipidemia    Hypertension    Iron deficiency anemia    Pneumonia    "a few times" (03/30/2013)   Psoriasis 04/29/2012   New onset evaluated by Dr Marylou Flesher, Dermatology, [facility] 6/13  Rx 0.1% Tacrolimus ointment   Psoriasis    Pulmonary embolism (HCC) 12/2009   Large central bilateral PE's    Schizophrenia (HCC)    Sleep apnea    "waiting on my CPAP" (03/30/2013)   Varicose veins of legs    Past Surgical History:  Past Surgical History:  Procedure Laterality Date   CARPAL TUNNEL RELEASE Bilateral    DILATION AND CURETTAGE OF UTERUS  1970's   "once" (03/30/2013)   JOINT REPLACEMENT     ORIF FEMUR FRACTURE Left 03/10/2019   Procedure: OPEN REDUCTION INTERNAL FIXATION (ORIF) DISTAL FEMUR FRACTURE;  Surgeon: Myrene Galas, MD;  Location: [facility] OR;  Service: Orthopedics;  Laterality: Left;   SPINAL FUSION     2004   TOTAL KNEE ARTHROPLASTY Right 11/2008   TUBAL LIGATION  1973   VEIN SURGERY  left leg   HPI:  Pt is an 82 yo F with PMH mild dementia, schizoaffective and bipolar disorders, HTN presented to [facility] ED  as code stroke. Her last known well was 11am. Around 1330 she was noted to have new R facial droop, R arm weakness, and slurred speech. She arrived to [facility] ED at 1404 via EMS. She underwent CT/MRI of the head that were negative for acute processes. Was found to be COVID positive and per daughter only previous complaint was sore throat and cough. Nursing reports on 8/3 that she has productive coughing but unable to determine if it is Covid or s/sx of aspiration. Pt unable to follow commands due to AMS. Pt is a resident of [facility] in [place] and has arm stiffness at baseline due to arthritis.    Assessment / Plan / Recommendation  Clinical Impression   Pt seen for skilled ST bedside swallow evaluation to determine PO readiness. Pt's current diet NPO -diet at her residence prior to admission unknown. Pt was assessed with thin liquid, puree, and regular solids. The pts OME was limited due to pt's inability to follow directions, observed to be functionally WNL. The pt is edentulous but reports she has dentures that should be coming. The pt consumed thin liquid via spoon sip x2 and straw sip x4 given assist for feeding with no overt s/sx of aspiration. The  pt did take an impulsively large sip via straw. The pt consumed puree x3 via spoon given assist for feeding with no overt s/sx of aspiration. The pt had x2 bites of regular transitional solids self-fed. The pt had prolonged and disorganized mastication and an immediate cough x1 with a swallow attempt. The pt physically removed small pieces of the solid bolus from her oral cavity twice. The pt had moderate oral residue on the tongue that was mildly cleared by a cued liquid wash. The pt also had intermittent mild belching t/o PO trials. Safest diet rec at this time is thin liquid (IDDSI 0)/ Puree (Dys 1/IDDSI 4) with frequent oral care and STRICT aspiration precautions (sit upright for all meals, remain upright for 30 minutes, small bites and sips, eat  slowly, assist for all meals). SLP to f/u as appropriate to ensure tolerance of diet recs. SLP Visit Diagnosis: Dysphagia, unspecified (R13.10)    Aspiration Risk  Mild aspiration risk    Diet Recommendation Dysphagia 1 (Puree);Thin liquid    Liquid Administration via: Straw;Cup Medication Administration: Whole meds with puree Supervision: Staff to assist with self feeding;Intermittent supervision to cue for compensatory strategies Compensations: Minimize environmental distractions;Slow rate;Small sips/bites Postural Changes: Seated upright at 90 degrees;Remain upright for at least 30 minutes after po intake    Other  Recommendations Oral Care Recommendations: Oral care BID;Staff/trained caregiver to provide oral care    Recommendations for follow up therapy are one component of a multi-disciplinary discharge planning process, led by the attending physician.  Recommendations may be updated based on patient status, additional functional criteria and insurance authorization.     Assistance Recommended at Discharge    Functional Status Assessment Patient has had a recent decline in their functional status and demonstrates the ability to make significant improvements in function in a reasonable and predictable amount of time.  Frequency and Duration min 2x/week  2 weeks       Prognosis Prognosis for improved oropharyngeal function: Good Barriers to Reach Goals: Cognitive deficits      Swallow Study   General Date of Onset: 04/27/23 HPI: Pt is an 82 yo F with PMH mild dementia, schizoaffective and bipolar disorders, HTN presented to [facility] ED as code stroke. Her last known well was 11am. Around 1330 she was noted to have new R facial droop, R arm weakness, and slurred speech. She arrived to [facility] ED at 1404 via EMS. She underwent CT/MRI of the head that were negative for acute processes. Was found to be COVID positive and per daughter only previous complaint was sore throat and cough. Nursing  reports on 8/3 that she has productive coughing but unable to determine if it is Covid or s/sx of aspiration. Pt unable to follow commands due to AMS. Pt is a resident of [facility] in [place] and has arm stiffness at baseline due to arthritis. Type of Study: Bedside Swallow Evaluation Previous Swallow Assessment: NPO (diet at her residence unknown) Diet Prior to this Study: Information not available Behavior/Cognition: Impulsive;Pleasant mood;Doesn't follow directions;Confused Oral Cavity Assessment: Within Functional Limits (Seemingly WFL, pt unable to undergo full OME due to AMS) Oral Care Completed by SLP: No Oral Cavity - Dentition: Dentures, not available;Edentulous Self-Feeding Abilities: Total assist Patient Positioning: Upright in bed Baseline Vocal Quality: Normal Volitional Cough: Cognitively unable to elicit Volitional Swallow: Unable to elicit (due to cog)    Oral/Motor/Sensory Function Overall Oral Motor/Sensory Function: Within functional limits   Ice Chips     Thin  Liquid Thin Liquid: Within functional limits Presentation: Spoon;Straw    Nectar Thick Nectar Thick Liquid: Not tested   Honey Thick Honey Thick Liquid: Not tested   Puree Puree: Within functional limits Presentation: Spoon   Solid     Solid: Impaired Presentation: Self Fed Oral Phase Impairments: Poor awareness of bolus;Impaired mastication Oral Phase Functional Implications: Impaired mastication;Oral holding;Oral residue (pt removed bolus from oral cavity x2 given small bites) Pharyngeal Phase Impairments: Suspected delayed Swallow;Cough - Immediate      Dione Housekeeper M.S. [facility] SLP

## 2023-04-27 NOTE — Plan of Care (Signed)

## 2023-04-27 NOTE — Progress Notes (Addendum)
Subjective:   Summary: [name] [name] is a 82 y.o. year old female currently admitted on the IMTS HD#0 for acute encephalopathy 2/2 COVID 19 infection.  Overnight Events: NA   Pt seen bedside this AM. She is able to say "good morning" and repeats statements said to her, she is mildly redirectable today, and is orientated to herself only.   Objective:  Vital signs in last 24 hours: Vitals:   04/26/23 2345 04/27/23 0033 04/27/23 0500 04/27/23 0845  BP: 137/78 130/82  129/86  Pulse: 82 85  87  Resp: (!) 24 (!) 22  20  Temp:  98.4 F (36.9 C)  99.5 F (37.5 C)  TempSrc:  Oral  Oral  SpO2: 92% 95%    Weight:   108.4 kg   Height:       Supplemental O2: Room Air SpO2: 95 %   Physical Exam:  Constitutional: Elderly female, in no acute distress Cardiovascular: RRR, no murmurs, rubs or gallops Pulmonary/Chest: normal work of breathing on room air, lungs clear to auscultation bilaterally Abdominal: soft, non-tender, non-distended Skin: warm and dry Extremities: upper/lower extremity pulses 2+, no lower extremity edema present Neurological: Baseline she is wheelchair bound. CN II-XII intact, unable to assess strength properly given significant osteoarthritis of upper extremities, and hx of surgery on lower extremity, but does move all of them spontaneously. Orientated to self only.   Filed Weights   04/26/23 1400 04/26/23 1459 04/27/23 0500  Weight: 108.4 kg 108.4 kg 108.4 kg    No intake or output data in the 24 hours ending 04/27/23 1136 Net IO Since Admission: No IO data has been entered for this period [04/27/23 1136]  Pertinent Labs:    Latest Ref Rng & Units 04/26/2023    2:08 PM 04/26/2023    2:04 PM 03/11/2019    6:23 AM  CBC  WBC 4.0 - 10.5 K/uL  8.4  13.0   Hemoglobin 12.0 - 15.0 g/dL 47.8  29.5  8.5   Hematocrit 36.0 - 46.0 % 41.0  40.6  26.6   Platelets 150 - 400 K/uL  135  208        Latest Ref Rng & Units 04/26/2023    2:08 PM 04/26/2023     2:04 PM 03/11/2019    6:23 AM  CMP  Glucose 70 - 99 mg/dL 621  308  657   BUN 8 - 23 mg/dL 10  9  14    Creatinine 0.44 - 1.00 mg/dL 8.46  9.62  9.52   Sodium 135 - 145 mmol/L 137  134  138   Potassium 3.5 - 5.1 mmol/L 4.0  4.3  3.7   Chloride 98 - 111 mmol/L 100  97  100   CO2 22 - 32 mmol/L  27  26   Calcium 8.9 - 10.3 mg/dL  9.1  9.0   Total Protein 6.5 - 8.1 g/dL  8.3    Total Bilirubin 0.3 - 1.2 mg/dL  0.4    Alkaline Phos 38 - 126 U/L  60    AST 15 - 41 U/L  13    ALT 0 - 44 U/L  8       Imaging: EEG adult  Result Date: 04/26/2023 Windell Norfolk, MD     04/26/2023  9:29 PM History: 82 year old woman with dementia presenting with altered mental status. EEG to rule out seizure EEG classification:  Awake and drowsy Duration: 23 minutes Technical aspects: This EEG study was done with scalp electrodes positioned according to the 10-20 International system of electrode placement. Electrical activity was reviewed with band pass filter of 1-70Hz , sensitivity of 7 uV/mm, display speed of 66mm/sec with a 60Hz  notched filter applied as appropriate. EEG data were recorded continuously and digitally stored. Description of the recording: The background rhythms of this recording consists of a fairly well modulated medium amplitude delta-theta activity. Present in the anterior head region is a 15-20 Hz beta activity. Photic stimulation was not performed. Hyperventilation was not performed. No abnormal epileptiform discharges seen during this recording. There was mild to moderate diffuse slowing with sharp contoured waves. There was increase myogenic artifact. There were no electrographic seizure identified. Abnormality: Mild to moderate diffuse slowing Impression: This is an abnormal EEG recorded while drowsy and awake due to mild to moderate diffuse slowing which is consistent with generalized brain dysfunction, nonspecific.  Windell Norfolk, MD [facility]   MR Brain Wo Contrast (neuro  protocol)  Result Date: 04/26/2023 CLINICAL DATA:  Initial evaluation for neuro deficit, stroke. EXAM: MRI HEAD WITHOUT CONTRAST TECHNIQUE: Multiplanar, multiecho pulse sequences of the brain and surrounding structures were obtained without intravenous contrast. COMPARISON:  CT from earlier the same day. FINDINGS: Brain: Examination degraded by motion. Cerebral volume within normal limits. T2/FLAIR hyperintensity involving the periventricular deep white matter both cerebral hemispheres, consistent with chronic small vessel ischemic disease. Encephalomalacia and gliosis at the anterior left frontal lobe consistent with prior infarct. Minimal chronic hemosiderin staining at this location. No evidence for acute or subacute ischemia. Gray-white matter differentiation maintained. No other acute or chronic intracranial blood products. No mass lesion, midline shift or mass effect. No hydrocephalus or extra-axial fluid collection. Pituitary gland suprasellar region within normal limits. Vascular: Major intracranial vascular flow voids are maintained. Skull and upper cervical spine: Cranial junction within normal limits. Bone marrow signal intensity normal. No scalp soft tissue abnormality. Sinuses/Orbits: Prior bilateral ocular lens replacement. Paranasal sinuses are largely clear. Small right mastoid effusion noted, of doubtful significance. Other: None. IMPRESSION: 1. No acute intracranial abnormality. 2. 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 Electronically Signed   By: Rise Mu M.D.   On: 04/26/2023 19:10   DG Chest Port 1 View  Result Date: 04/26/2023 CLINICAL DATA:  Sepsis EXAM: PORTABLE CHEST 1 VIEW COMPARISON:  X-ray 07/24/2019 and older FINDINGS: Underinflation. Enlarged cardiopericardial silhouette with tortuous ectatic aorta. Stable widened mediastinum. No pneumothorax, effusion or edema. Overlapping cardiac leads. Degenerative changes of the spine.  Film is rotated. IMPRESSION: Underinflation. Stable enlarged cardiopericardial silhouette with a widened mediastinum. Electronically Signed   By: Karen Kays M.D.   On: 04/26/2023 16:54   CT ANGIO HEAD NECK W WO CM (CODE STROKE)  Result Date: 04/26/2023 CLINICAL DATA:  Neuro deficit, acute, stroke suspected EXAM: CT ANGIOGRAPHY HEAD AND NECK WITH AND WITHOUT CONTRAST TECHNIQUE: Multidetector CT imaging of the head and neck was performed using the standard protocol during bolus administration of intravenous contrast. Multiplanar CT image reconstructions and MIPs were obtained to evaluate the vascular anatomy. Carotid stenosis measurements (when applicable) are obtained utilizing NASCET criteria, using the distal internal carotid diameter as the denominator. RADIATION DOSE REDUCTION: This exam was performed according to the departmental dose-optimization program which includes automated exposure control, adjustment of the mA and/or kV according to patient size and/or use of iterative reconstruction technique. CONTRAST:  75mL OMNIPAQUE IOHEXOL 350 MG/ML SOLN COMPARISON:  Same  day CT head FINDINGS: See same day CT head for intracranial findings. CTA NECK FINDINGS Aortic arch: Standard branching. Imaged portion shows no evidence of aneurysm or dissection. No significant stenosis of the major arch vessel origins. Right carotid system: No evidence of dissection, stenosis (50% or greater), or occlusion. Left carotid system: No evidence of dissection, stenosis (50% or greater), or occlusion. Vertebral arteries: Right-dominant. There is mild narrowing of the origin of the left vertebral artery which arises from the aortic arch. The left vertebral artery terminates as a PICA. No evidence of dissection, stenosis (50% or greater), or occlusion. Skeleton: Negative.1 Other neck: Negative. Upper chest: Right apical pleural-parenchymal scarring. Review of the MIP images confirms the above findings CTA HEAD FINDINGS Anterior  circulation: No significant stenosis, proximal occlusion, aneurysm, or vascular malformation. Posterior circulation: No significant stenosis, proximal occlusion, aneurysm, or vascular malformation. Venous sinuses: As permitted by contrast timing, patent. Anatomic variants: None Review of the MIP images confirms the above findings IMPRESSION: 1. No intracranial large vessel occlusion or significant stenosis. 2. No hemodynamically significant stenosis in the neck. 3. See same day CT head for intracranial findings. Electronically Signed   By: Lorenza Cambridge M.D.   On: 04/26/2023 14:28   CT HEAD CODE STROKE WO CONTRAST  Result Date: 04/26/2023 CLINICAL DATA:  Code stroke. Right-sided facial droop and arm weakness. EXAM: CT HEAD WITHOUT CONTRAST TECHNIQUE: Contiguous axial images were obtained from the base of the skull through the vertex without intravenous contrast. RADIATION DOSE REDUCTION: This exam was performed according to the departmental dose-optimization program which includes automated exposure control, adjustment of the mA and/or kV according to patient size and/or use of iterative reconstruction technique. COMPARISON:  CT Head 03/09/19 FINDINGS: Brain: No evidence of acute infarction, hemorrhage, hydrocephalus, extra-axial collection or mass lesion/mass effect. Sequela of moderate chronic microvascular ischemic change with chronic left frontal lobe infarct. Vascular: No hyperdense vessel or unexpected calcification. Skull: Normal. Negative for fracture or focal lesion. Sinuses/Orbits: 2 no middle ear or mastoid effusion. Paranasal sinuses are clear. Bilateral lens replacement. Orbits are otherwise Other: None. ASPECTS [facility] Stroke Program Early CT Score): 10 when accounting for chronic findings. IMPRESSION: No hemorrhage or CT evidence of an acute cortical infarct. Aspects is 10 when accounting for chronic findings. Findings were paged to Dr. Amada Jupiter on 04/26/2023 at 2:20 p.m via [facility] paging system.  Electronically Signed   By: Lorenza Cambridge M.D.   On: 04/26/2023 14:22     Assessment/Plan:   Principal Problem:   Acute encephalopathy   Patient Summary: RHEMI BALBACH is a 82 y.o. with a pertinent PMH of mild dementia, schizoaffective, bipolar disorder, HTN, who presented with acute R facial droop and R arm weakness and admitted for encephalopathy 2/2 COVID infection.    #Acute metabolic Encephalopathy related to COVID #COVID-19 Fortunately, stroke work up has been unrevealing. Continuing to monitor neurologic status. Discussion with daughter, [name] [name], revealed that at baseline she is coherent and does not repeat statements. She is wheelchair bound at her facility as well due to chronic osteoarthritis and multiple surgeries of her lower extremities. Grip strength is intact and seems adequate, as well as lower extremities. Discussed with daughter to come in and assess if pt is back to her baseline or not.   Plan:  - Tylenol for fevers  - Delirium Precautions  - SLP eval for diet, will remain NPO for now  - PT/OT evaluation   #Dementia  #Schizoaffective Disorder  #Bipolar Disorder  Continuing home depakote 125mg   TID, prozac 40mg , and gabapentin 400TID  Chronic Stable Medical Conditions   HTN - continue home metoprolol tartrate 25mg  BID   Osteoarthritis Per daughter arthritis is severe and at time painful and limits her movement, particularly her arms - tylenol 650 mg q6h prn   Constipation - continue home miralax 17g every day - continue home Docusate sodium 100mg  every day   HLD - Atorvastatin 10mg  qd   Vit D Deficiency - continue home D3 1000u every day   History IDA Hg WNL 13.6, MCV 97.8. - continue home Ferrous sulfate 325mg  po every day   Hx of PE (2011) - continue home Xarelto 20 every day  Diet: NPO IVF: None,None VTE: NOAC Code: DNR PT/OT recs: Pending, none.    Dispo: Anticipated discharge to Nursing Home in 2 days pending medical management.    Olegario Messier, MD PGY-2 Internal Medicine Resident Pager Number [phone] Please contact the on call pager after 5 pm and on weekends at [phone].

## 2023-04-27 NOTE — Progress Notes (Signed)
Physical Therapy Note  Notes report patient from skilled facility, non-ambulatory, w/c bound, and dependent with ADLs & IADLs at baseline. Confirmed with RN, discussed with OT. Plan for return to facility for LTC at d/c. No acute rehab needs indicated at this level of dependent care. Admitted for AMS in setting of COVID. Will sign-off.   Kathlyn Sacramento, PT, DPT [facility]  Rehabilitation Services Physical Therapist Office: [phone] Website: [email or web address]

## 2023-04-28 DIAGNOSIS — G9340 Encephalopathy, unspecified: Secondary | ICD-10-CM | POA: Diagnosis not present

## 2023-04-28 DIAGNOSIS — G9341 Metabolic encephalopathy: Secondary | ICD-10-CM | POA: Diagnosis not present

## 2023-04-28 LAB — RENAL FUNCTION PANEL
Albumin: 2.9 g/dL — ABNORMAL LOW (ref 3.5–5.0)
Anion gap: 10 (ref 5–15)
BUN: 9 mg/dL (ref 8–23)
CO2: 27 mmol/L (ref 22–32)
Calcium: 8.8 mg/dL — ABNORMAL LOW (ref 8.9–10.3)
Chloride: 97 mmol/L — ABNORMAL LOW (ref 98–111)
Creatinine, Ser: 0.78 mg/dL (ref 0.44–1.00)
GFR, Estimated: >60 mL/min (ref >60)
Glucose, Bld: 115 mg/dL — ABNORMAL HIGH (ref 70–99)
Phosphorus: 2 mg/dL — ABNORMAL LOW (ref 2.5–4.6)
Potassium: 3.5 mmol/L (ref 3.5–5.1)
Sodium: 134 mmol/L — ABNORMAL LOW (ref 135–145)

## 2023-04-28 LAB — CBC
HCT: 39.3 % (ref 36.0–46.0)
Hemoglobin: 13.4 g/dL (ref 12.0–15.0)
MCH: 32.4 pg (ref 26.0–34.0)
MCHC: 34.1 g/dL (ref 30.0–36.0)
MCV: 95.2 fL (ref 80.0–100.0)
Platelets: 116 10*3/uL — ABNORMAL LOW (ref 150–400)
RBC: 4.13 MIL/uL (ref 3.87–5.11)
RDW: 13.6 % (ref 11.5–15.5)
WBC: 8.4 10*3/uL (ref 4.0–10.5)
nRBC: 0 % (ref 0.0–0.2)

## 2023-04-28 MED ORDER — ACETAMINOPHEN 500 MG PO TABS: 1000.0000 mg | ORAL_TABLET | Freq: Four times a day (QID) | ORAL | Status: DC | PRN

## 2023-04-28 MED ORDER — ACETAMINOPHEN 500 MG PO TABS: 1000.0000 mg | ORAL_TABLET | Freq: Four times a day (QID) | ORAL | Status: AC

## 2023-04-28 MED ORDER — ACETAMINOPHEN 650 MG RE SUPP: 650.0000 mg | Freq: Four times a day (QID) | RECTAL | Status: DC | PRN

## 2023-04-28 NOTE — Discharge Summary (Signed)
Name: [name] [name] MRN: [id number] DOB: 06/12/1941 82 y.o. PCP: [facility], [name] [name]  Date of Admission: 04/26/2023  2:04 PM Date of Discharge: 04/28/2023 Attending Physician: [name] Catalina, MD  Discharge Diagnosis: 1. Principal Problem:   Acute encephalopathy    Discharge Medications: Allergies as of 04/28/2023   No Known Allergies      Medication List     TAKE these medications    acetaminophen 500 MG tablet Commonly known as: TYLENOL Take 2 tablets (1,000 mg total) by mouth every 6 (six) hours.   atorvastatin 10 MG tablet Commonly known as: LIPITOR Take 10 mg by mouth every evening.   cholecalciferol 25 MCG (1000 UNIT) tablet Commonly known as: VITAMIN D3 Take 1,000 Units by mouth daily.   divalproex 125 MG capsule Commonly known as: DEPAKOTE SPRINKLE Take 375 mg by mouth 2 (two) times daily. Take 3 capsules by mouth in the Morning, and 4 capsules at bedtime.   docusate sodium 100 MG capsule Commonly known as: COLACE Take 100 mg by mouth daily.   ferrous sulfate 325 (65 FE) MG EC tablet Take 325 mg by mouth daily.   FLUoxetine 40 MG capsule Commonly known as: PROZAC Take 40 mg by mouth daily.   gabapentin 400 MG capsule Commonly known as: NEURONTIN Take 400 mg by mouth 3 (three) times daily.   lidocaine 4 % Apply 1 patch topically daily. Apply to lower back   metoprolol tartrate 25 MG tablet Commonly known as: LOPRESSOR Take 12.5 mg by mouth 2 (two) times daily. morning   polyethylene glycol 17 g packet Commonly known as: MIRALAX / GLYCOLAX Take 17 g by mouth daily.   rivaroxaban 20 MG Tabs tablet Commonly known as: XARELTO Take 20 mg by mouth daily with supper.        Disposition and follow-up:   Ms.[name] [name] was discharged from [facility] in Good condition.  At the hospital follow up visit please address:  1.    COVID-19: Please ensure resolution of symptoms. SLP also evaluated patient and  recommended Dysphagia 1 diet, please ensure this diet is upheld and can advance as tolerated.   2.  Labs / imaging needed at time of follow-up: NA  3.  Pending labs/ test needing follow-up: NA  Follow-up Appointments:  Contact information for after-discharge care     Destination     [facility] .   Service: Skilled Nursing Contact information: [address] [place] [address] [phone]                     Hospital Course by problem list:  #Acute Metabolic Encephalopathy 2/2 COVID 19 Pt initially presented with confusion and new right side facial droop, arm weakness, and slurred speech. Code Stroke was called in the ED, both CT and MRI did not reveal any sign of acute CVA, she has a chronic left front infarct as well as chronic microvascular changes. She also tested positive for COVID-19, and had been experiencing symptoms of fevers and chills for the last two days.  Neurology consulted and stated that in the setting of her already advanced dementia, and no radiographic findings of CVA, likley COVID 19 delirium. She was treated supportively with Tylenol for fever management. Per daughter, she is close to her baseline. Recommended following up with PCP, as well as speech language pathology as they recommended Dysphagia 1 diet.  HTN Continued home metoprolol  tartrate 25mg  BID   Osteoarthritis Per daughter arthritis is severe and at time painful and limits her movement, particularly her arms. Treated with tylenol 650 mg q6h prn   Constipation Continued  home miralax 17g every day. Continued home Docusate sodium 100mg  every day   HLD Continued Atorvastatin 10mg  qd   Vit D Deficiency Continued home D3 1000u every day   History IDA Hg WNL 13.6, MCV 97.8. - continue home Ferrous sulfate 325mg  po every day   Hx of PE (2011) - continue home Xarelto 20 every day Discharge Exam:   BP 133/76 (BP  Location: Right Arm)   Pulse 93   Temp 98.8 F (37.1 C) (Oral)   Resp 18   Ht 5\' 5"  (1.651 m)   Wt 108.4 kg   LMP 11/22/1996   SpO2 94%   BMI 39.77 kg/m  Discharge exam:   Constitutional: Pleasantly Demented elderly female, in no acute distress Cardiovascular: RRR, no murmurs, rubs or gallops  Pulmonary/Chest: Normal work of breathing on room air, lungs clear to auscultation bilaterally  Abdominal: Soft, non-tender, non-distended  Skin: Warm and Dry  Extremities: Upper/lower extremity pulses Neurological: CN II-XII intact, extremity exam limited by chronic pain, but moves all extremities spontaneously. Orientated to self only.   Pertinent Labs, Studies, and Procedures:     Latest Ref Rng & Units 04/28/2023    2:34 AM 04/26/2023    2:08 PM 04/26/2023    2:04 PM  CBC  WBC 4.0 - 10.5 K/uL 8.4   8.4   Hemoglobin 12.0 - 15.0 g/dL 40.1  02.7  25.3   Hematocrit 36.0 - 46.0 % 39.3  41.0  40.6   Platelets 150 - 400 K/uL 116   135      Discharge Instructions: Discharge Instructions     Diet - low sodium heart healthy   Complete by: As directed    Diet - low sodium heart healthy   Complete by: As directed    Increase activity slowly   Complete by: As directed    Increase activity slowly   Complete by: As directed          Latest Ref Rng & Units 04/28/2023    2:34 AM 04/26/2023    2:08 PM 04/26/2023    2:04 PM  BMP  Glucose 70 - 99 mg/dL 664  403  474   BUN 8 - 23 mg/dL 9  10  9    Creatinine 0.44 - 1.00 mg/dL 2.59  5.63  8.75   Sodium 135 - 145 mmol/L 134  137  134   Potassium 3.5 - 5.1 mmol/L 3.5  4.0  4.3   Chloride 98 - 111 mmol/L 97  100  97   CO2 22 - 32 mmol/L 27   27   Calcium 8.9 - 10.3 mg/dL 8.8   9.1     Signed: NooruddinJason Fila, MD 04/28/2023, 11:30 AM   Pager: [phone]

## 2023-04-28 NOTE — Discharge Instructions (Signed)
It was a pleasure taking care of you in the hospital. You were diagnosed with a COVID infection that was likely causing your functional status to decline. I anticipate with tylenol and letting the infection ride out, and treating symptomatically with cough drops and tylenol for fevers. Your diet has also changed to a dysphagia 1 diet, I'm hoping as you get better we can get you back to a normal diet. Please follow up with your primary care provider as soon as you can. Thank you!

## 2023-04-28 NOTE — Plan of Care (Signed)
  Problem: Clinical Measurements: Goal: Respiratory complications will improve Outcome: Progressing Goal: Cardiovascular complication will be avoided Outcome: Progressing   Problem: Nutrition: Goal: Adequate nutrition will be maintained Outcome: Progressing   

## 2023-04-28 NOTE — TOC Transition Note (Signed)
Transition of Care [facility]) - CM/SW Discharge Note   Patient Details  Name: [name] [name] MRN: [id number] Date of Birth: 11/30/1940  Transition of Care [facility]) CM/SW Contact:  Deatra Robinson, [place] Phone Number: 04/28/2023, 12:11 PM   Clinical Narrative:  pt for dc back to [facility] today where she is a LTC resident. Spoke to [place] in [facility] admissions who confirmed they are prepared to admit pt to room 624A. DC summary faxed to facility at their request [phone]. Per MD, pt's dtr aware of dc and reports agreeable. RN provided with number for report and [facility] arranged for transport. SW signing off at dc.   Dellie Burns, MSW, LCSW [phone] (coverage)       Final next level of care: Skilled Nursing Facility Barriers to Discharge: No Barriers Identified   Patient Goals and CMS Choice      Discharge Placement                Patient chooses bed at: Other - please specify in the comment section below: ([facility]) Patient to be transferred to facility by: [facility] Name of family member notified: Miriam/Dtr Patient and family notified of of transfer: 04/28/23  Discharge Plan and Services Additional resources added to the After Visit Summary for                                       Social Determinants of Health (SDOH) Interventions SDOH Screenings   Food Insecurity: Patient Unable To Answer (04/27/2023)  Housing: Patient Unable To Answer (04/27/2023)  Transportation Needs: Patient Unable To Answer (04/27/2023)  Utilities: Patient Unable To Answer (04/27/2023)  Tobacco Use: Low Risk  (04/26/2023)     Readmission Risk Interventions     No data to display
